# Patient Record
Sex: Female | Born: 1940 | Race: White | Hispanic: No | Smoking: Former smoker
Health system: Southern US, Community
[De-identification: ages and names within clinical notes are randomized; demographics above are authoritative.]

## PROBLEM LIST (undated history)

## (undated) DIAGNOSIS — J449 Chronic obstructive pulmonary disease, unspecified: Secondary | ICD-10-CM

## (undated) DIAGNOSIS — K219 Gastro-esophageal reflux disease without esophagitis: Secondary | ICD-10-CM

## (undated) DIAGNOSIS — I214 Non-ST elevation (NSTEMI) myocardial infarction: Secondary | ICD-10-CM

## (undated) DIAGNOSIS — M1990 Unspecified osteoarthritis, unspecified site: Secondary | ICD-10-CM

## (undated) DIAGNOSIS — E785 Hyperlipidemia, unspecified: Secondary | ICD-10-CM

## (undated) DIAGNOSIS — M545 Low back pain, unspecified: Secondary | ICD-10-CM

## (undated) DIAGNOSIS — C3491 Malignant neoplasm of unspecified part of right bronchus or lung: Secondary | ICD-10-CM

## (undated) DIAGNOSIS — I2510 Atherosclerotic heart disease of native coronary artery without angina pectoris: Secondary | ICD-10-CM

## (undated) DIAGNOSIS — F32A Depression, unspecified: Secondary | ICD-10-CM

## (undated) DIAGNOSIS — F329 Major depressive disorder, single episode, unspecified: Secondary | ICD-10-CM

## (undated) DIAGNOSIS — I5181 Takotsubo syndrome: Secondary | ICD-10-CM

## (undated) DIAGNOSIS — IMO0002 Reserved for concepts with insufficient information to code with codable children: Secondary | ICD-10-CM

## (undated) DIAGNOSIS — F419 Anxiety disorder, unspecified: Secondary | ICD-10-CM

## (undated) DIAGNOSIS — C801 Malignant (primary) neoplasm, unspecified: Secondary | ICD-10-CM

## (undated) DIAGNOSIS — J45909 Unspecified asthma, uncomplicated: Secondary | ICD-10-CM

## (undated) DIAGNOSIS — I10 Essential (primary) hypertension: Secondary | ICD-10-CM

## (undated) DIAGNOSIS — R011 Cardiac murmur, unspecified: Secondary | ICD-10-CM

## (undated) HISTORY — PX: FRACTURE SURGERY: SHX138

## (undated) HISTORY — DX: Major depressive disorder, single episode, unspecified: F32.9

## (undated) HISTORY — DX: Anxiety disorder, unspecified: F41.9

## (undated) HISTORY — DX: Gastro-esophageal reflux disease without esophagitis: K21.9

## (undated) HISTORY — PX: TOTAL ABDOMINAL HYSTERECTOMY: SHX209

## (undated) HISTORY — DX: Chronic obstructive pulmonary disease, unspecified: J44.9

## (undated) HISTORY — DX: Hyperlipidemia, unspecified: E78.5

## (undated) HISTORY — PX: KNEE SURGERY: SHX244

## (undated) HISTORY — PX: ANKLE SURGERY: SHX546

## (undated) HISTORY — DX: Low back pain, unspecified: M54.50

## (undated) HISTORY — DX: Depression, unspecified: F32.A

## (undated) HISTORY — DX: Unspecified osteoarthritis, unspecified site: M19.90

## (undated) HISTORY — PX: EYE SURGERY: SHX253

## (undated) HISTORY — DX: Low back pain: M54.5

## (undated) HISTORY — DX: Cardiac murmur, unspecified: R01.1

## (undated) HISTORY — PX: CHOLECYSTECTOMY: SHX55

## (undated) HISTORY — DX: Essential (primary) hypertension: I10

## (undated) HISTORY — PX: BACK SURGERY: SHX140

## (undated) HISTORY — PX: APPENDECTOMY: SHX54

## (undated) HISTORY — DX: Reserved for concepts with insufficient information to code with codable children: IMO0002

## (undated) HISTORY — DX: Malignant neoplasm of unspecified part of right bronchus or lung: C34.91

## (undated) HISTORY — DX: Malignant (primary) neoplasm, unspecified: C80.1

---

## 1968-07-25 HISTORY — PX: INNER EAR SURGERY: SHX679

## 1998-02-01 ENCOUNTER — Emergency Department (HOSPITAL_COMMUNITY): Admission: EM | Admit: 1998-02-01 | Discharge: 1998-02-01 | Payer: Self-pay | Admitting: *Deleted

## 1998-08-11 ENCOUNTER — Encounter: Payer: Self-pay | Admitting: Neurosurgery

## 1998-08-11 ENCOUNTER — Ambulatory Visit (HOSPITAL_COMMUNITY): Admission: RE | Admit: 1998-08-11 | Discharge: 1998-08-11 | Payer: Self-pay

## 1999-08-12 ENCOUNTER — Encounter: Payer: Self-pay | Admitting: Neurosurgery

## 1999-08-12 ENCOUNTER — Encounter: Admission: RE | Admit: 1999-08-12 | Discharge: 1999-08-12 | Payer: Self-pay | Admitting: Neurosurgery

## 1999-08-30 ENCOUNTER — Other Ambulatory Visit: Admission: RE | Admit: 1999-08-30 | Discharge: 1999-08-30 | Payer: Self-pay | Admitting: *Deleted

## 2000-01-04 ENCOUNTER — Encounter: Admission: RE | Admit: 2000-01-04 | Discharge: 2000-01-04 | Payer: Self-pay | Admitting: *Deleted

## 2000-01-04 ENCOUNTER — Encounter: Payer: Self-pay | Admitting: *Deleted

## 2000-06-22 ENCOUNTER — Ambulatory Visit (HOSPITAL_COMMUNITY): Admission: RE | Admit: 2000-06-22 | Discharge: 2000-06-22 | Payer: Self-pay | Admitting: Gastroenterology

## 2000-11-30 ENCOUNTER — Encounter: Payer: Self-pay | Admitting: Internal Medicine

## 2000-11-30 ENCOUNTER — Ambulatory Visit (HOSPITAL_COMMUNITY): Admission: RE | Admit: 2000-11-30 | Discharge: 2000-11-30 | Payer: Self-pay | Admitting: Internal Medicine

## 2001-01-09 ENCOUNTER — Ambulatory Visit (HOSPITAL_COMMUNITY): Admission: RE | Admit: 2001-01-09 | Discharge: 2001-01-09 | Payer: Self-pay | Admitting: Internal Medicine

## 2001-01-09 ENCOUNTER — Encounter: Payer: Self-pay | Admitting: Internal Medicine

## 2001-02-05 ENCOUNTER — Encounter: Payer: Self-pay | Admitting: Gastroenterology

## 2001-02-05 ENCOUNTER — Encounter: Admission: RE | Admit: 2001-02-05 | Discharge: 2001-02-05 | Payer: Self-pay | Admitting: Gastroenterology

## 2001-02-20 ENCOUNTER — Encounter: Admission: RE | Admit: 2001-02-20 | Discharge: 2001-02-20 | Payer: Self-pay | Admitting: Gastroenterology

## 2001-02-20 ENCOUNTER — Encounter: Payer: Self-pay | Admitting: Gastroenterology

## 2001-02-25 ENCOUNTER — Ambulatory Visit (HOSPITAL_COMMUNITY): Admission: RE | Admit: 2001-02-25 | Discharge: 2001-02-25 | Payer: Self-pay | Admitting: Internal Medicine

## 2001-02-25 ENCOUNTER — Encounter: Payer: Self-pay | Admitting: Internal Medicine

## 2001-02-28 ENCOUNTER — Ambulatory Visit (HOSPITAL_COMMUNITY): Admission: RE | Admit: 2001-02-28 | Discharge: 2001-02-28 | Payer: Self-pay | Admitting: Internal Medicine

## 2001-05-02 ENCOUNTER — Encounter: Admission: RE | Admit: 2001-05-02 | Discharge: 2001-05-02 | Payer: Self-pay | Admitting: Internal Medicine

## 2001-05-02 ENCOUNTER — Encounter: Payer: Self-pay | Admitting: Internal Medicine

## 2002-05-03 ENCOUNTER — Encounter: Payer: Self-pay | Admitting: Internal Medicine

## 2002-05-03 ENCOUNTER — Encounter: Admission: RE | Admit: 2002-05-03 | Discharge: 2002-05-03 | Payer: Self-pay | Admitting: Internal Medicine

## 2002-06-27 ENCOUNTER — Ambulatory Visit (HOSPITAL_COMMUNITY): Admission: RE | Admit: 2002-06-27 | Discharge: 2002-06-27 | Payer: Self-pay | Admitting: Internal Medicine

## 2003-01-31 ENCOUNTER — Encounter: Payer: Self-pay | Admitting: Internal Medicine

## 2003-01-31 ENCOUNTER — Encounter: Admission: RE | Admit: 2003-01-31 | Discharge: 2003-01-31 | Payer: Self-pay | Admitting: Internal Medicine

## 2003-01-31 IMAGING — CT CT ABDOMEN W/ CM
1 series · 15 of 32 positions shown, 19 images · IV contrast (GASTRO & OMNI 100/300)
Comparison: none

FINDINGS
CLINICAL DATA: LEFT LOWER QUADRANT PAIN.  ASTHMA.  HYPERTENSION.  PARTIAL HYSTERECTOMY,
CHOLECYSTECTOMY, APPENDECTOMY.   (CON - [V4])
ABDOMINAL CT WITH CONTRAST
SINCE [DATE], THERE IS NO INTERVAL CHANGE.  FOLLOWING ORAL CONTRAST AND IV ADMINISTRATION OF 100
CC OMNIPAQUE 300, MULTIDETECTOR SPIRAL AXIAL IMAGES THROUGH THE ABDOMEN AGAIN SHOW SMALL HIATUS
HERNIA, POST CHOLECYSTECTOMY STATE WITH NO DILATED BILIARY TRACT, SMALL STABLE BILATERAL RENAL
CYSTIC FOCI.  THE REMAINING ABDOMINAL ORGANS ARE NORMAL WITH NO NEW INFLAMMATION OR ADENOPATHY.
IMPRESSION
SINCE [DATE], NO INTERVAL CHANGE:
1.  STABLE SMALL HIATUS HERNIA, POST CHOLECYSTECTOMY.  STABLE SMALL RENAL CYSTS.
2.  OTHERWISE NEGATIVE.
PELVIC CT WITH CONTRAST
FOLLOWING ORAL AND IV CONTRAST, MULTIDETECTOR SPIRAL AXIAL IMAGES THROUGH THE PELVIS WERE OBTAINED.
THE PATIENT IS STATUS POST PARTIAL HYSTERECTOMY AND APPENDECTOMY BY HISTORY AND CT EXAMINATION.
INCIDENTAL 1 CM RIGHT OVARIAN CYST IS SEEN.  THERE IS MINIMAL EDEMATOUS INFILTRATION OF THE SIGMOID
MESENTERIC FAT WITH SLIGHTLY GREATER WALL THICKENING OF THE PROXIMAL SIGMOID COLON.  SCATTERED
DIVERTICULOSIS IS SEEN IN THIS REGION.  THIS PROBABLY REPRESENTS MILD FINDINGS  OF ACUTE
DIVERTICULITIS.  NO SIGNIFICANT PHLEGMON NOR ABSCESS IS SEEN.  NO FREE FLUID NOR ADENOPATHY IS
NOTED.  THE REMAINDER OF STUDY IS UNREMARKABLE WITH AGAIN NOTED RAY CAGE INTERBODY FUSION AT THE
INFERIOR LUMBAR SPINE.
1.  PROBABLE MINIMAL SIGMOID DIVERTICULITIS.
2.  1.3 CM RIGHT OVARIAN CYST.
3.  POST APPENDECTOMY AND PARTIAL HYSTERECTOMY AND RAY CAGE INTERBODY FUSION AT THE INFERIOR LUMBAR
SPINE.
4.  OTHERWISE NEGATIVE.

[Series 2: — · axial · 0.70mm/px · z∈[-387,-17]mm · 15 of 109 slices shown, 19 images]
[im 7/109  soft-tissue]
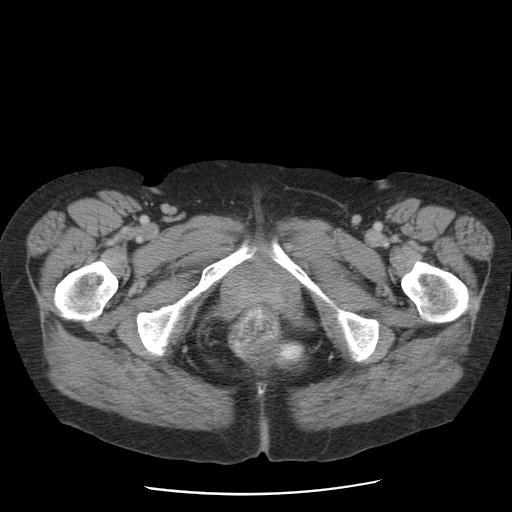
[im 7/109  bone]
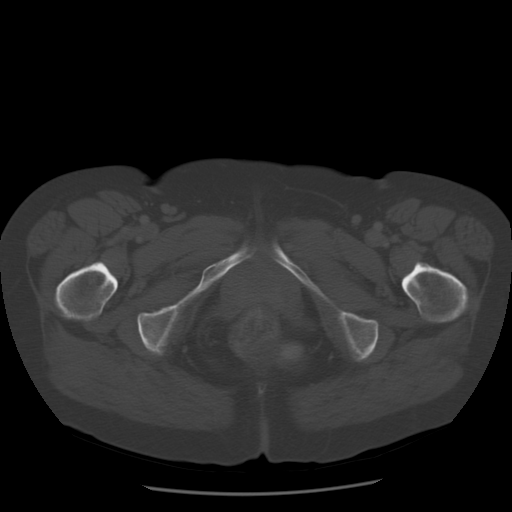
[im 14/109  soft-tissue]
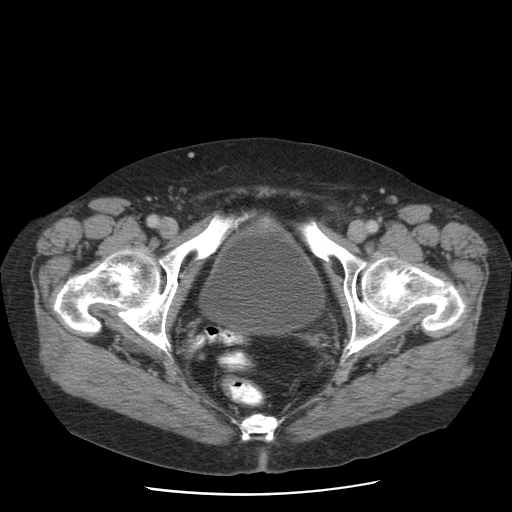
[im 21/109  soft-tissue]
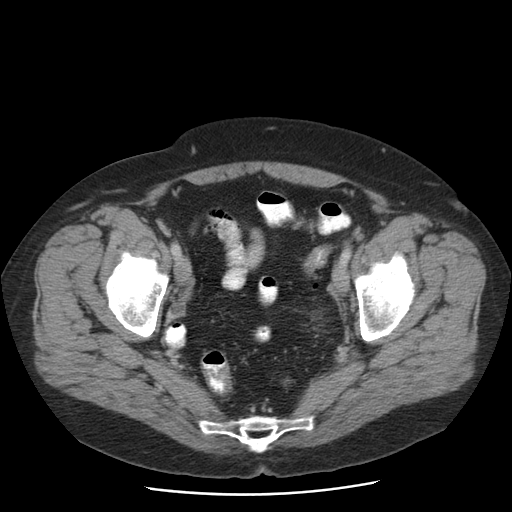
[im 32/109  soft-tissue]
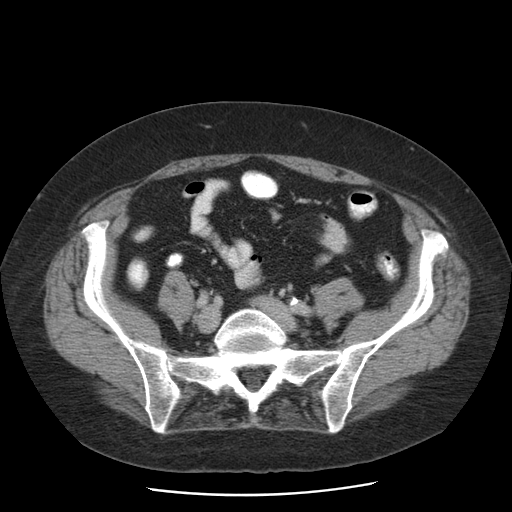
[im 39/109  soft-tissue]
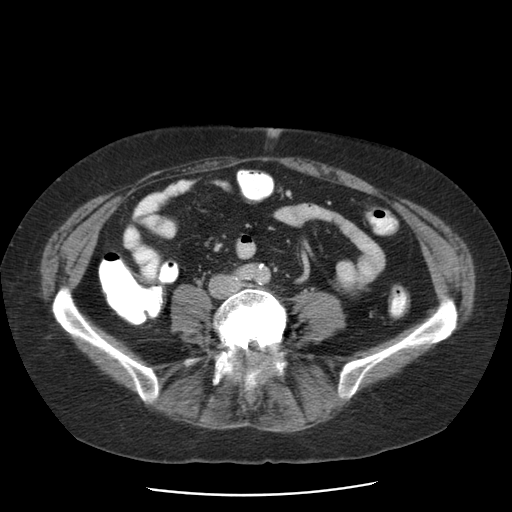
[im 46/109  soft-tissue]
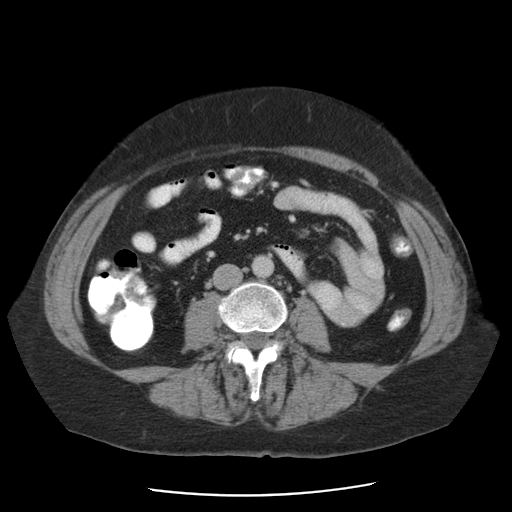
[im 56/109  soft-tissue]
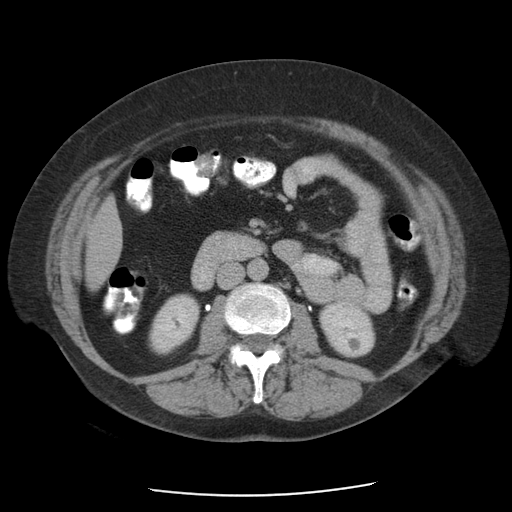
[im 63/109  soft-tissue]
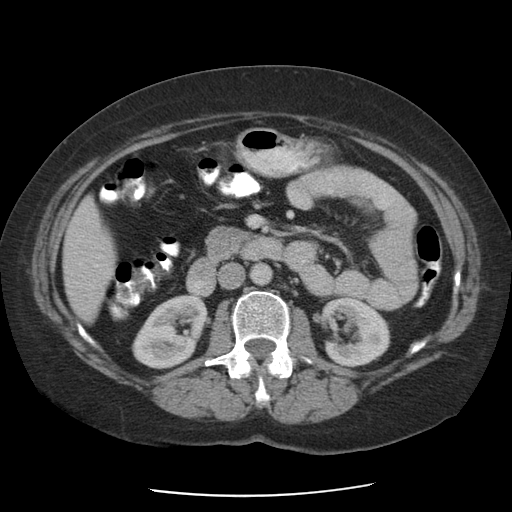
[im 70/109  soft-tissue]
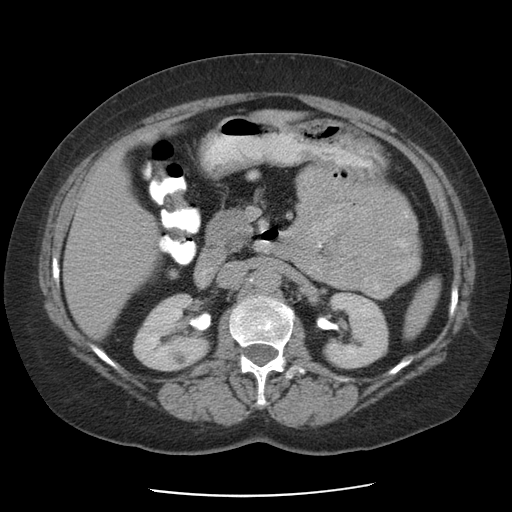
[im 70/109  bone]
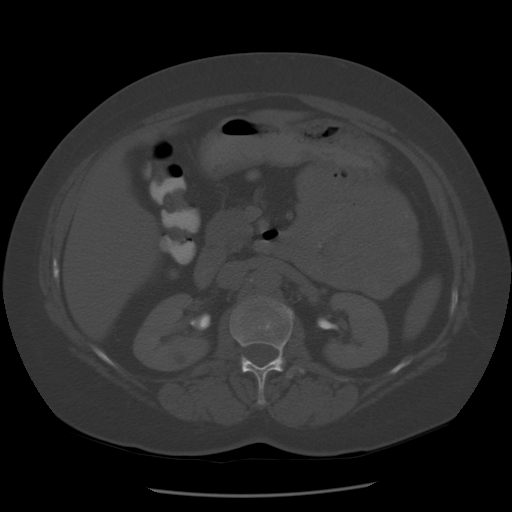
[im 77/109  soft-tissue]
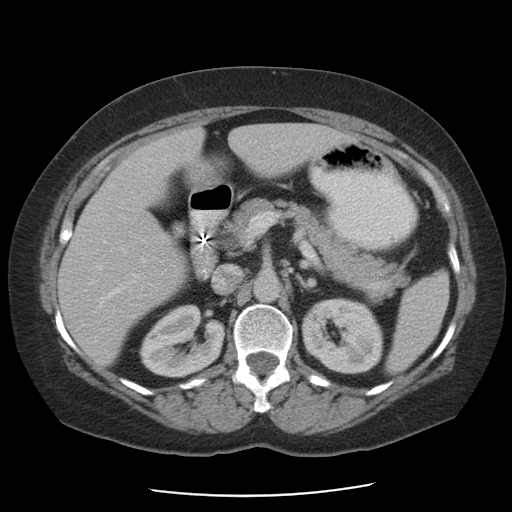
[im 88/109  soft-tissue]
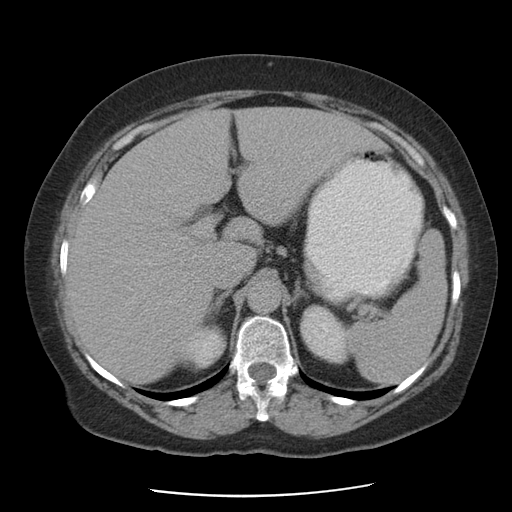
[im 95/109  soft-tissue]
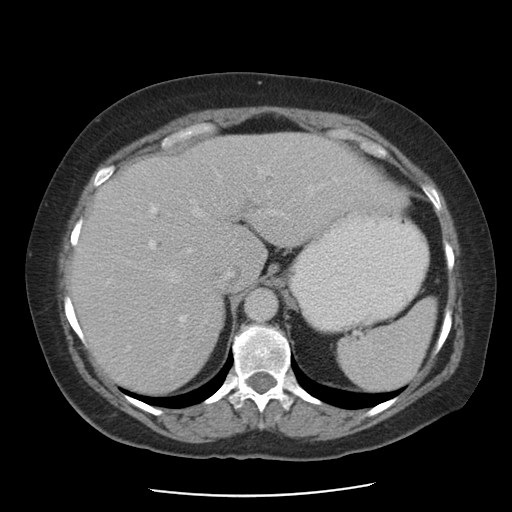
[im 95/109  lung]
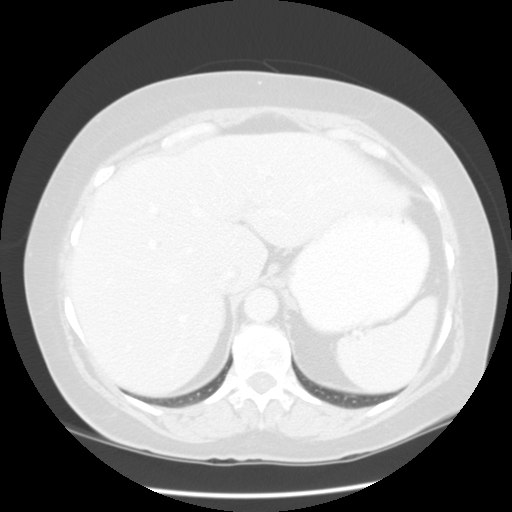
[im 98/109  lung]
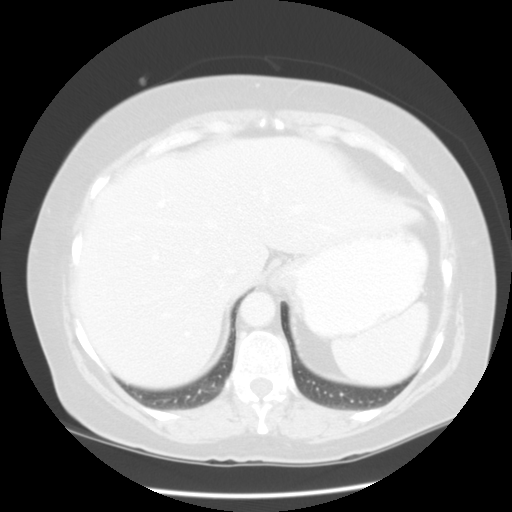
[im 102/109  soft-tissue]
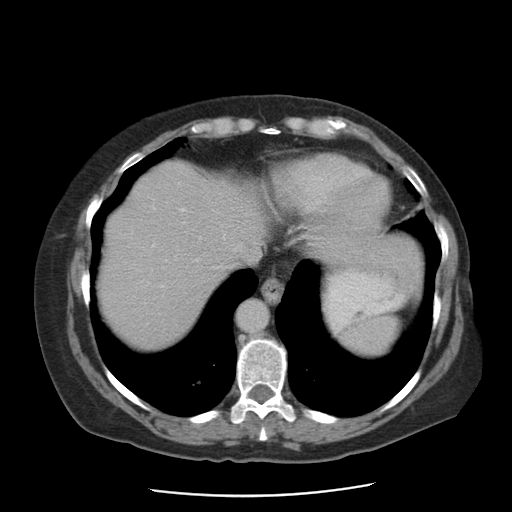
[im 102/109  lung]
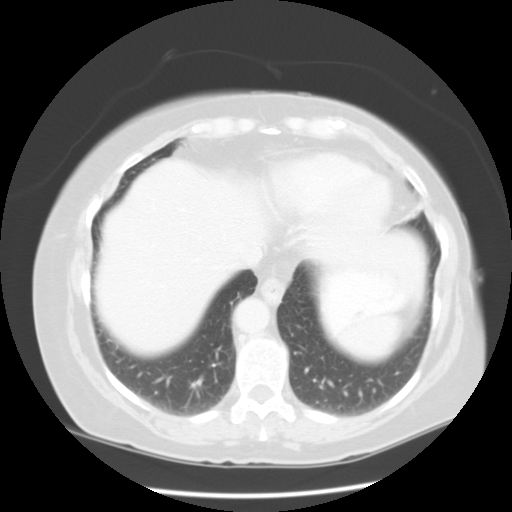
[im 105/109  lung]
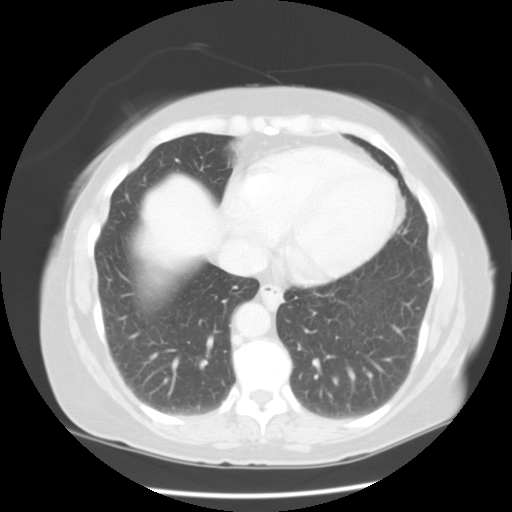

[15 of 32 positions shown; findings below may reference images not displayed]

## 2003-10-10 ENCOUNTER — Encounter: Admission: RE | Admit: 2003-10-10 | Discharge: 2003-10-10 | Payer: Self-pay | Admitting: Internal Medicine

## 2004-04-29 ENCOUNTER — Other Ambulatory Visit: Admission: RE | Admit: 2004-04-29 | Discharge: 2004-04-29 | Payer: Self-pay | Admitting: Internal Medicine

## 2005-03-13 ENCOUNTER — Emergency Department (HOSPITAL_COMMUNITY): Admission: EM | Admit: 2005-03-13 | Discharge: 2005-03-13 | Payer: Self-pay | Admitting: *Deleted

## 2005-03-13 IMAGING — CR DG ANKLE COMPLETE 3+V*L*
3 series · 3 of 3 positions shown · non-contrast
Comparison: none

CLINICAL DATA: Left ankle injury with pain and swelling.
 3-VIEW LEFT ANKLE:

[view not recorded (1 of 3)]
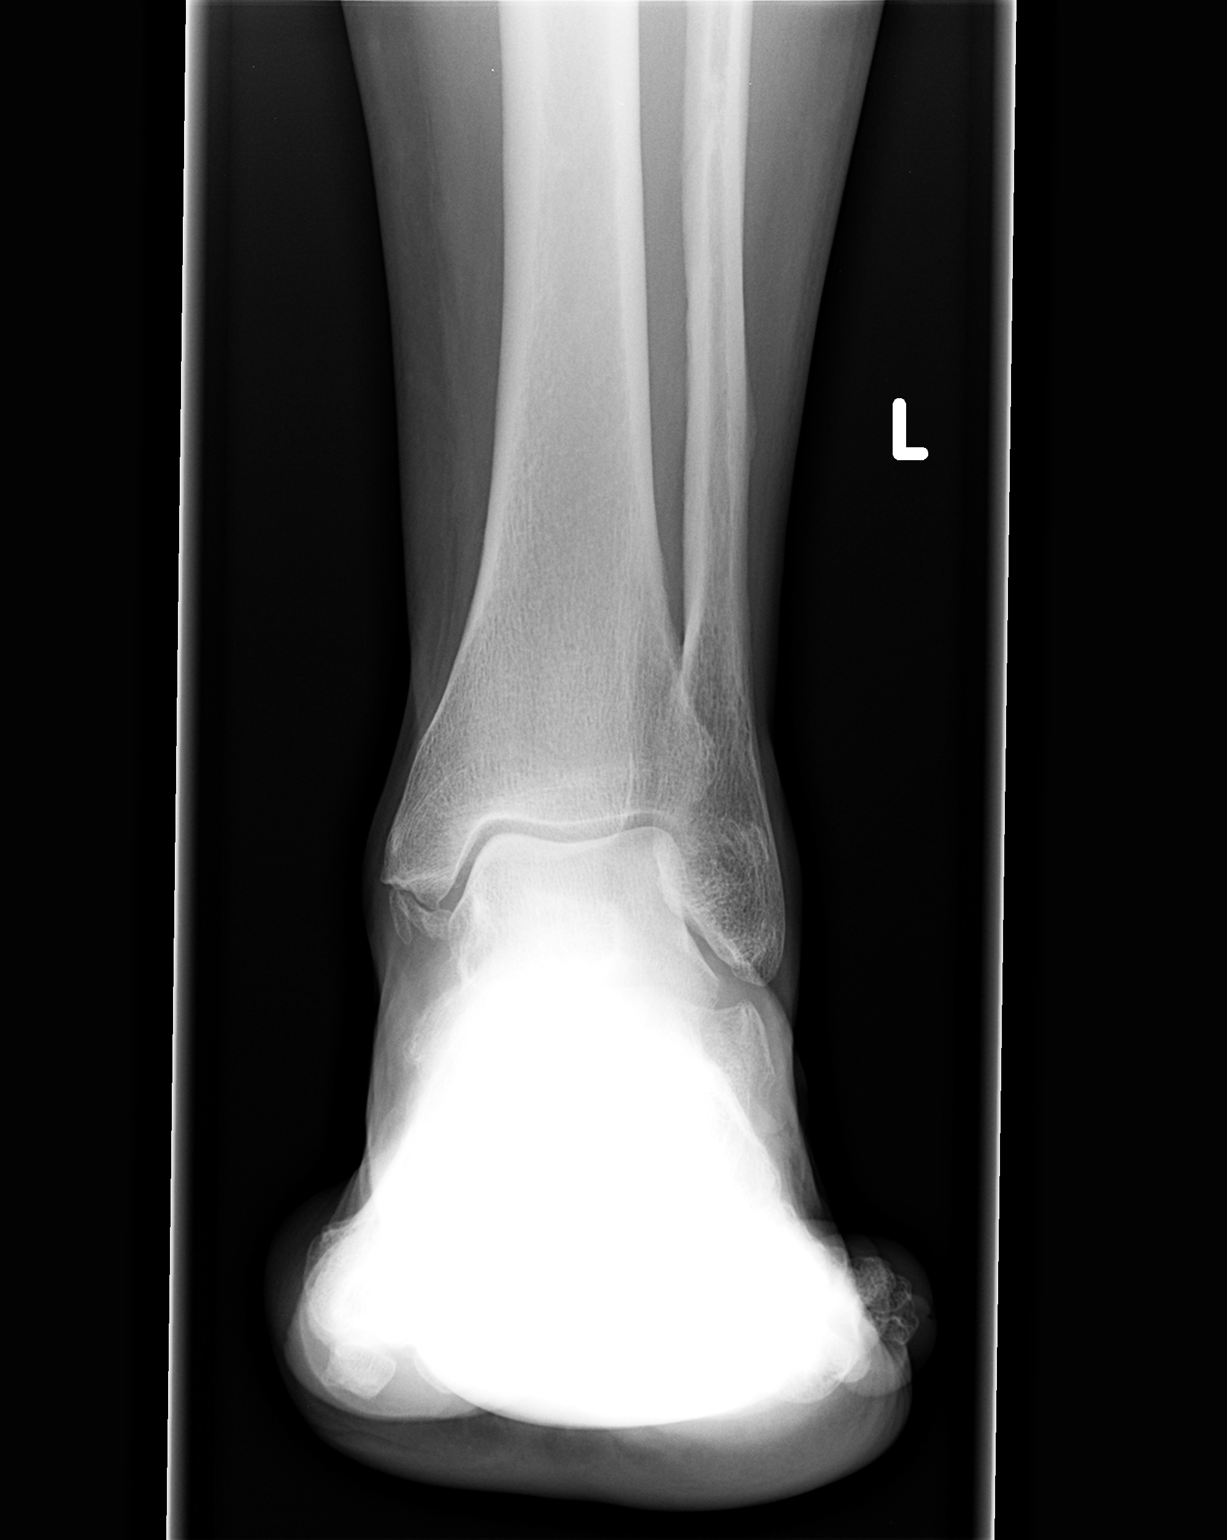

[view not recorded (2 of 3)]
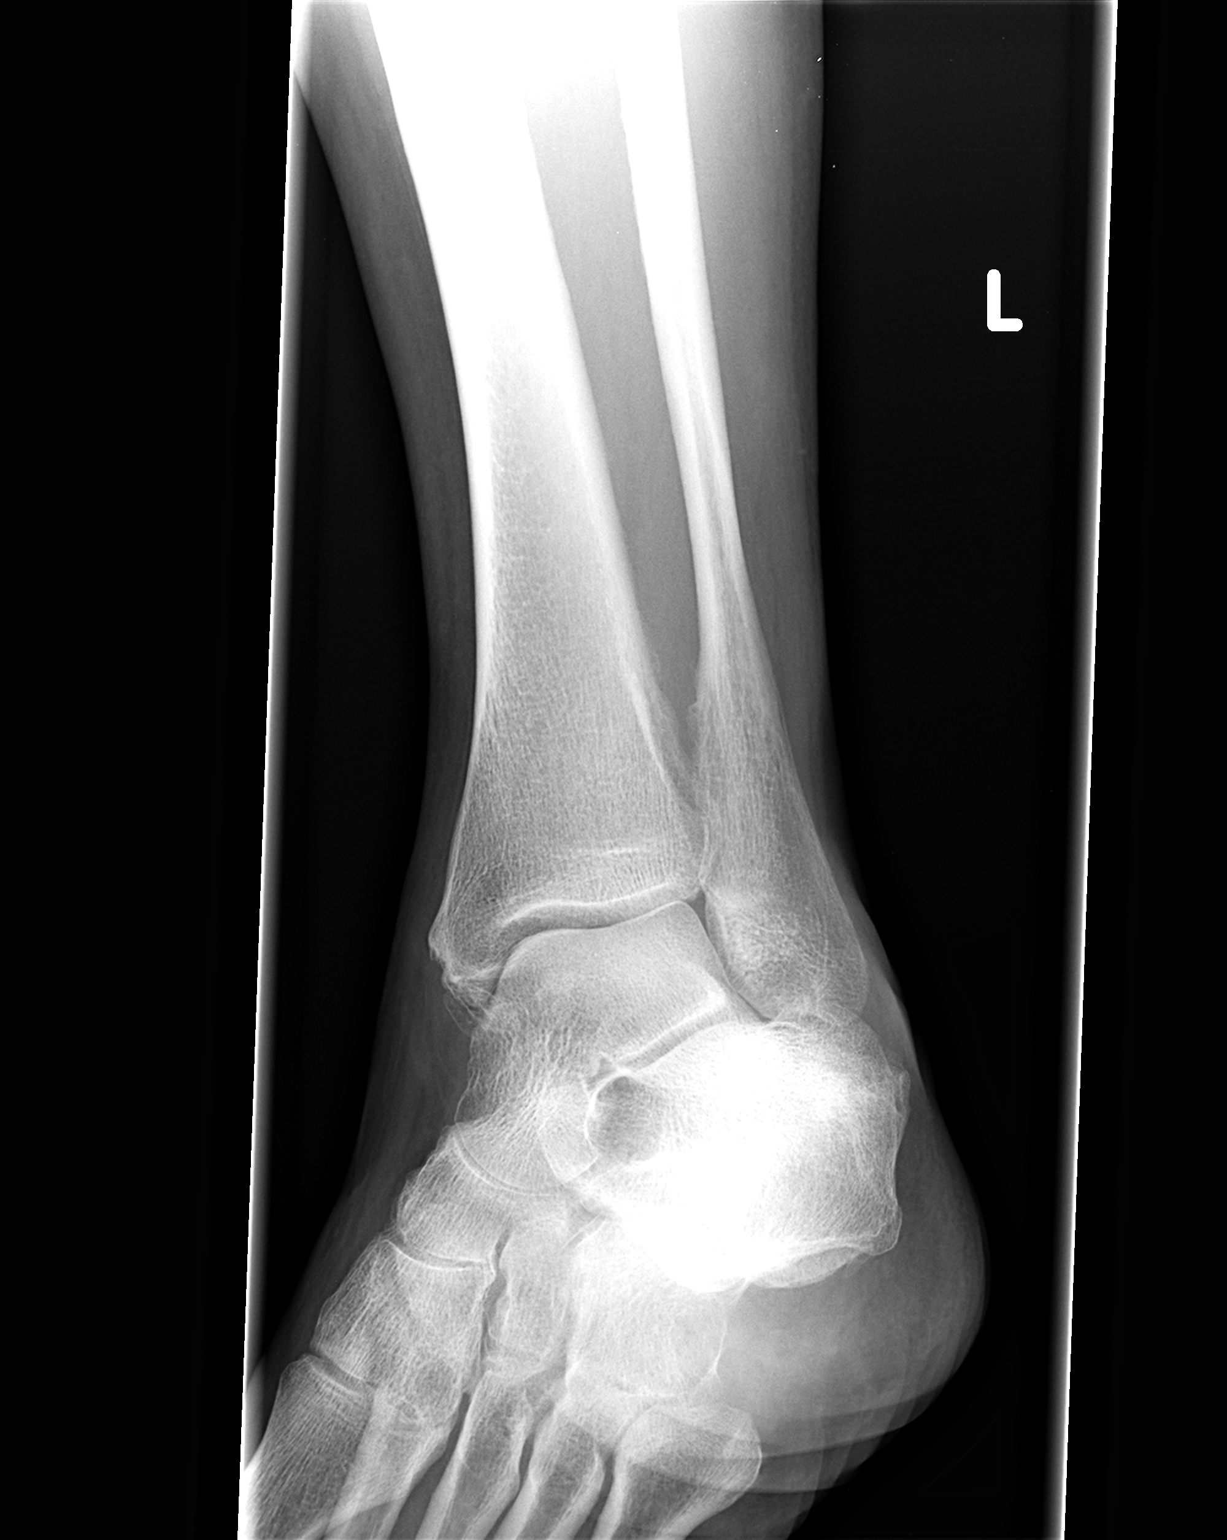

[view not recorded (3 of 3)]
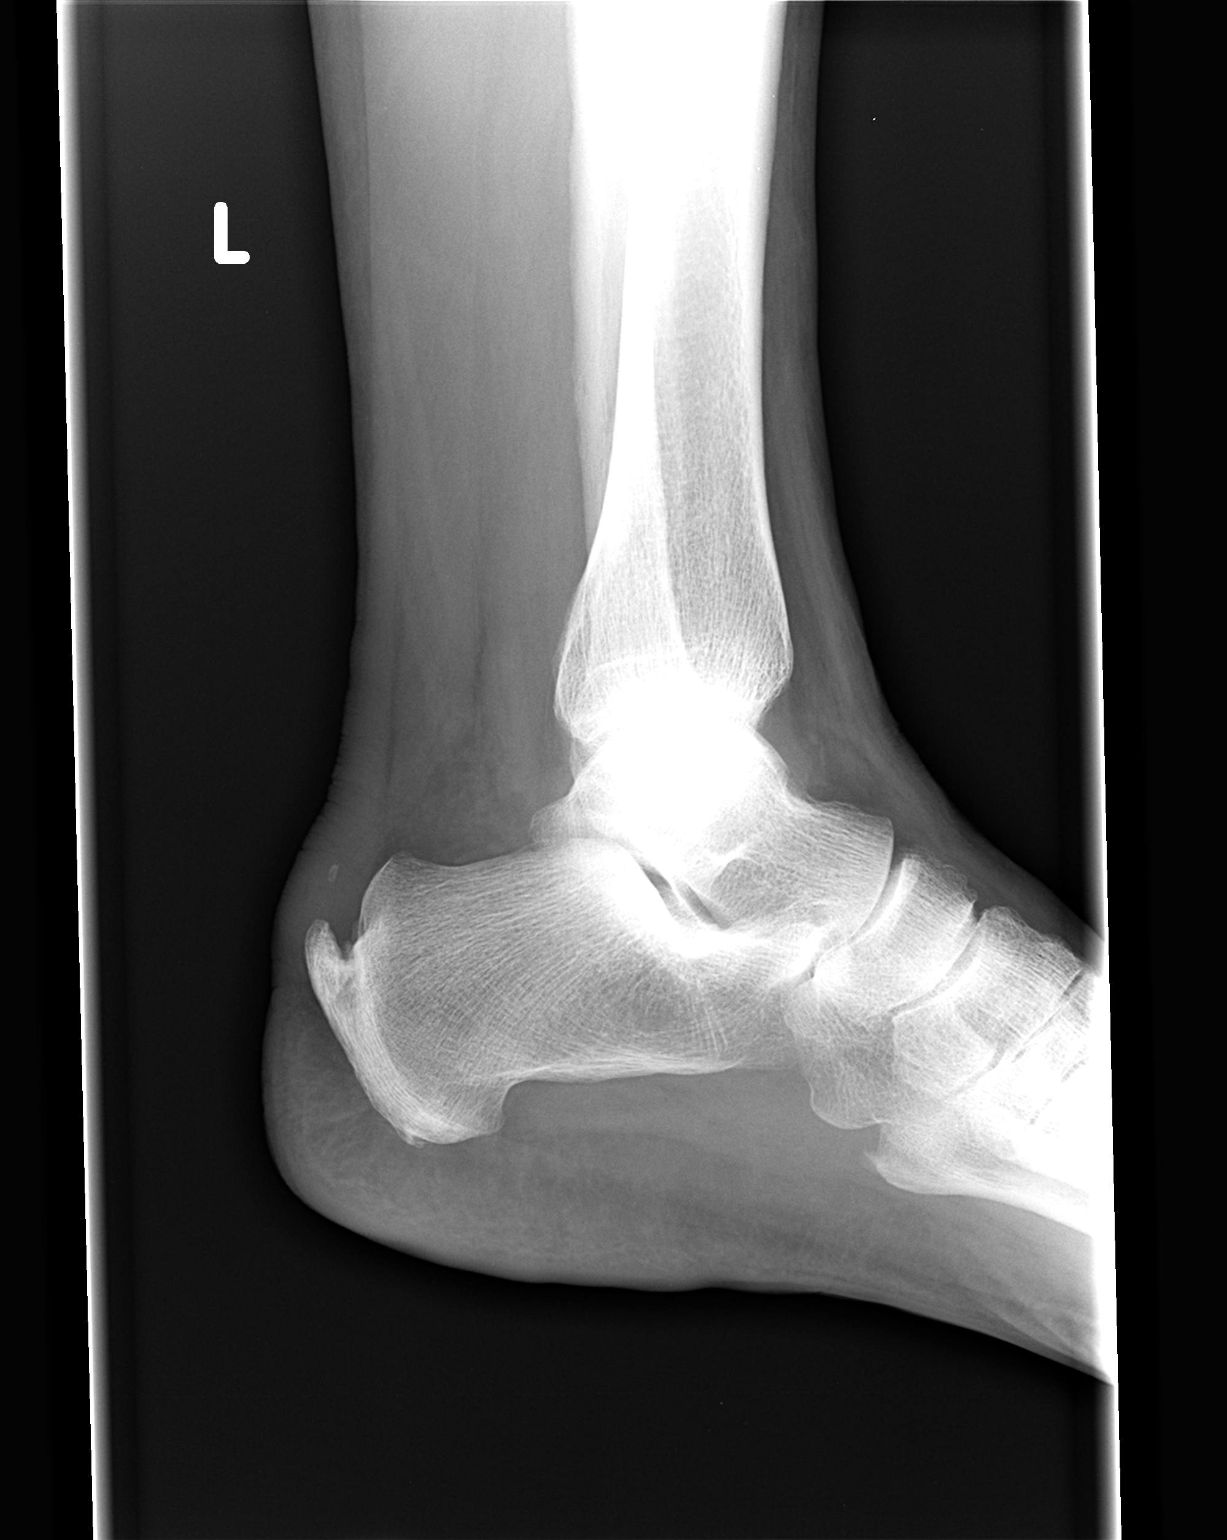

[3 of 3 positions shown; findings below may reference images not displayed]

FINDINGS: No evidence of acute fracture, subluxation, or dislocation.  The ankle mortise is intact.  Small ankle fusion is identified.  Remote medial malleolar fracture is present.
IMPRESSION: Ankle effusion without evidence of acute fracture.

## 2005-03-17 ENCOUNTER — Emergency Department (HOSPITAL_COMMUNITY): Admission: EM | Admit: 2005-03-17 | Discharge: 2005-03-17 | Payer: Self-pay | Admitting: Emergency Medicine

## 2005-06-02 ENCOUNTER — Encounter: Admission: RE | Admit: 2005-06-02 | Discharge: 2005-06-02 | Payer: Self-pay | Admitting: Internal Medicine

## 2005-07-21 ENCOUNTER — Emergency Department (HOSPITAL_COMMUNITY): Admission: EM | Admit: 2005-07-21 | Discharge: 2005-07-22 | Payer: Self-pay | Admitting: Emergency Medicine

## 2005-07-21 IMAGING — CR DG SHOULDER 2+V*L*
3 series · 3 of 3 positions shown · non-contrast
Comparison: none

CLINICAL DATA: Shoulder pain.   No known injury.    
 LEFT SHOULDER ? 3 VIEW:
 There are some mild degenerative changes at the acromioclavicular joint.  Otherwise, normal exam.

[w shoulder ap internal left]
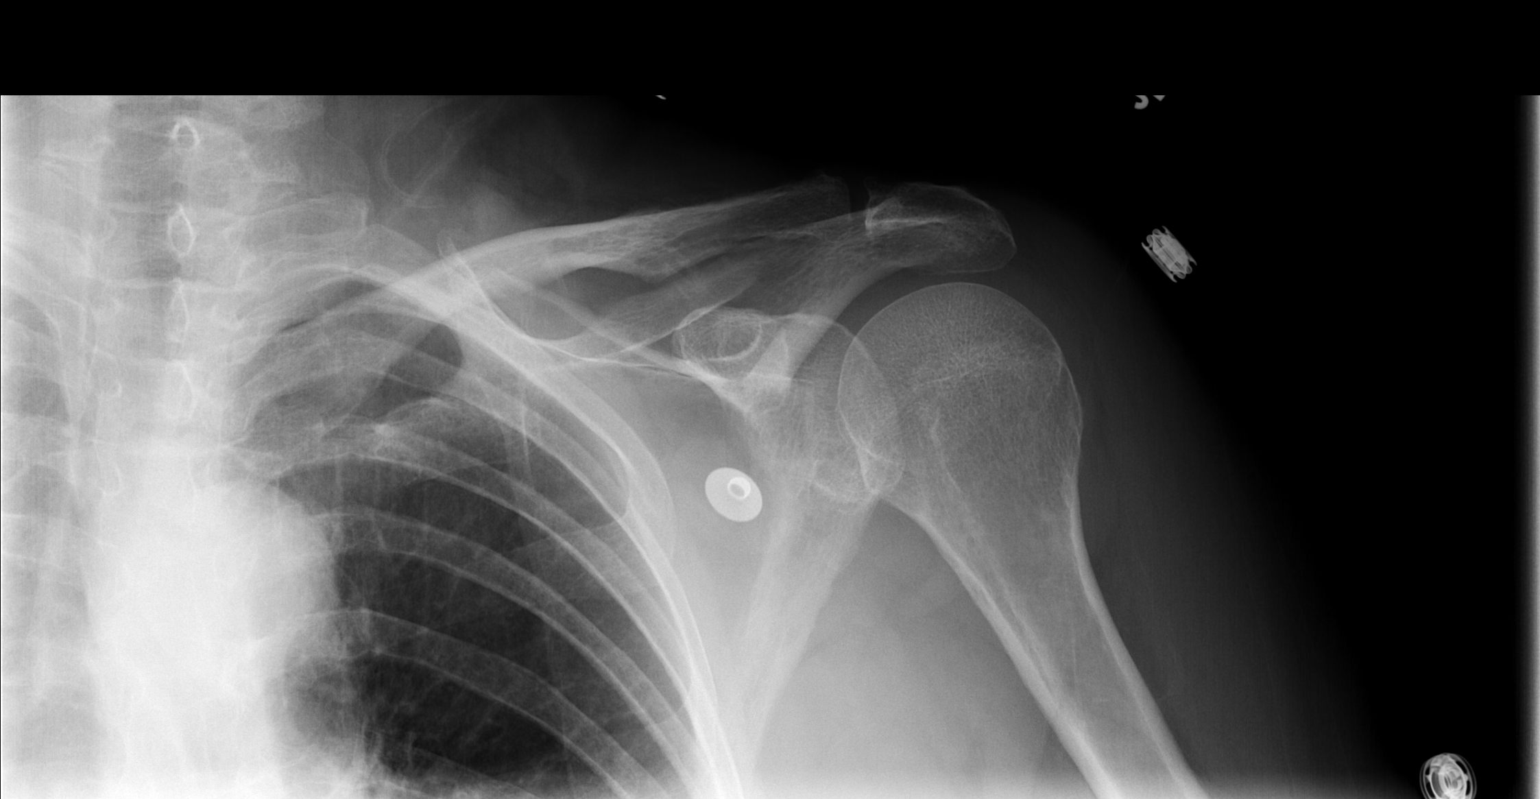

[w shoulder ap external left]
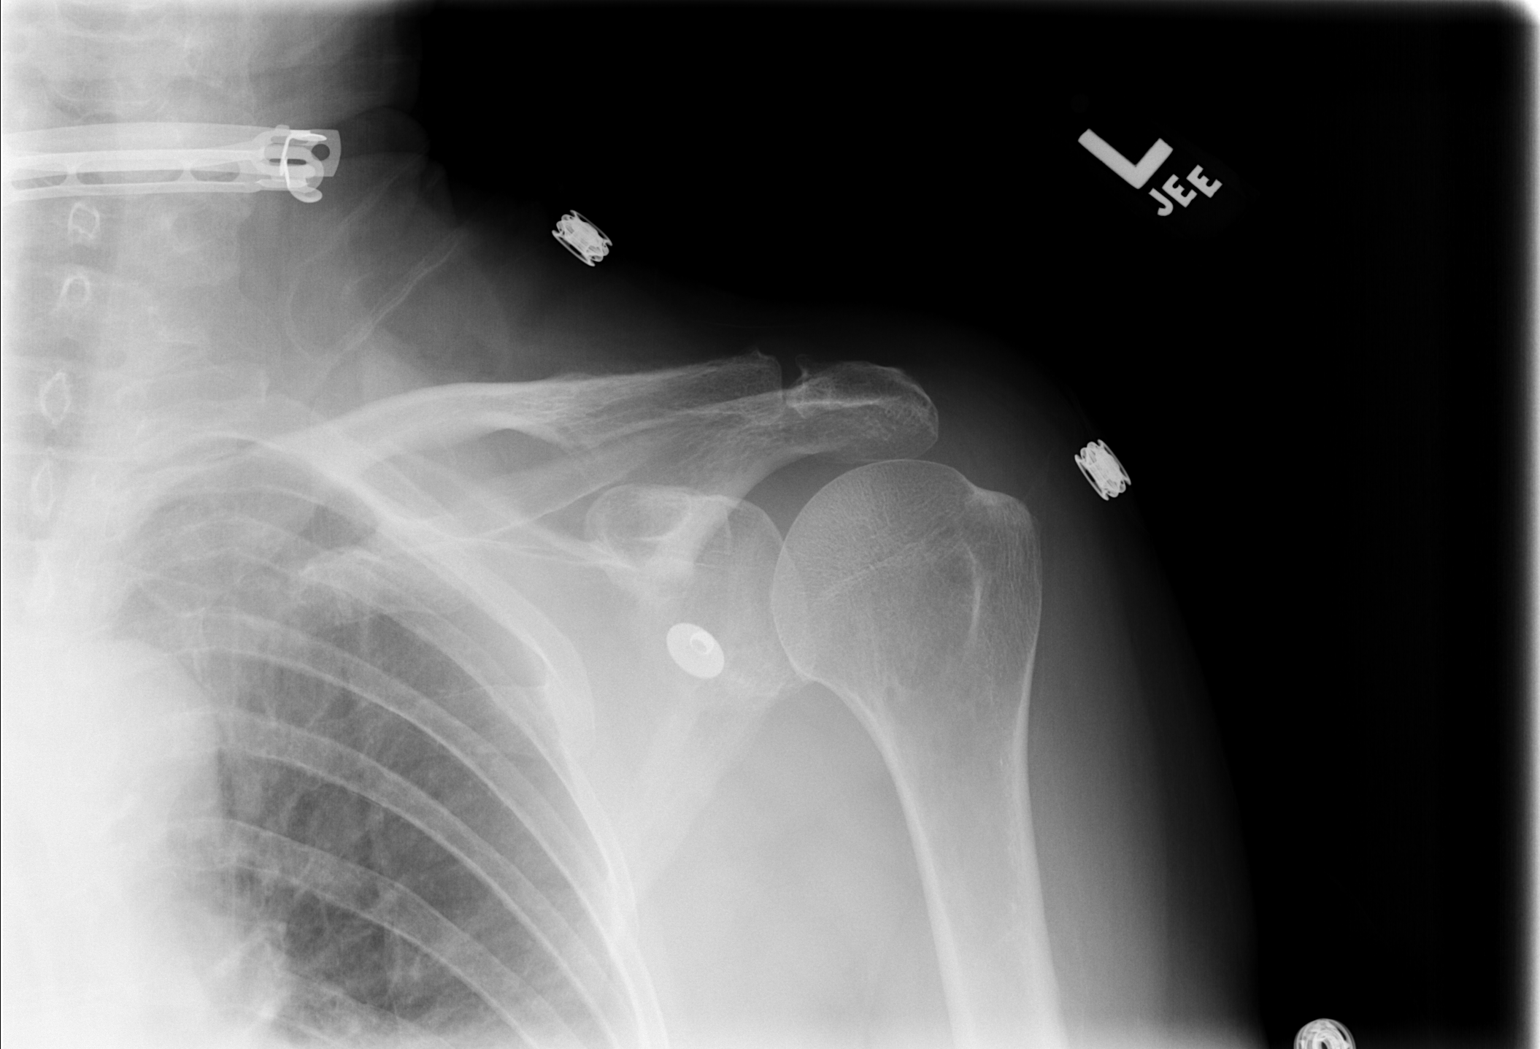

[w shoulder y view left]
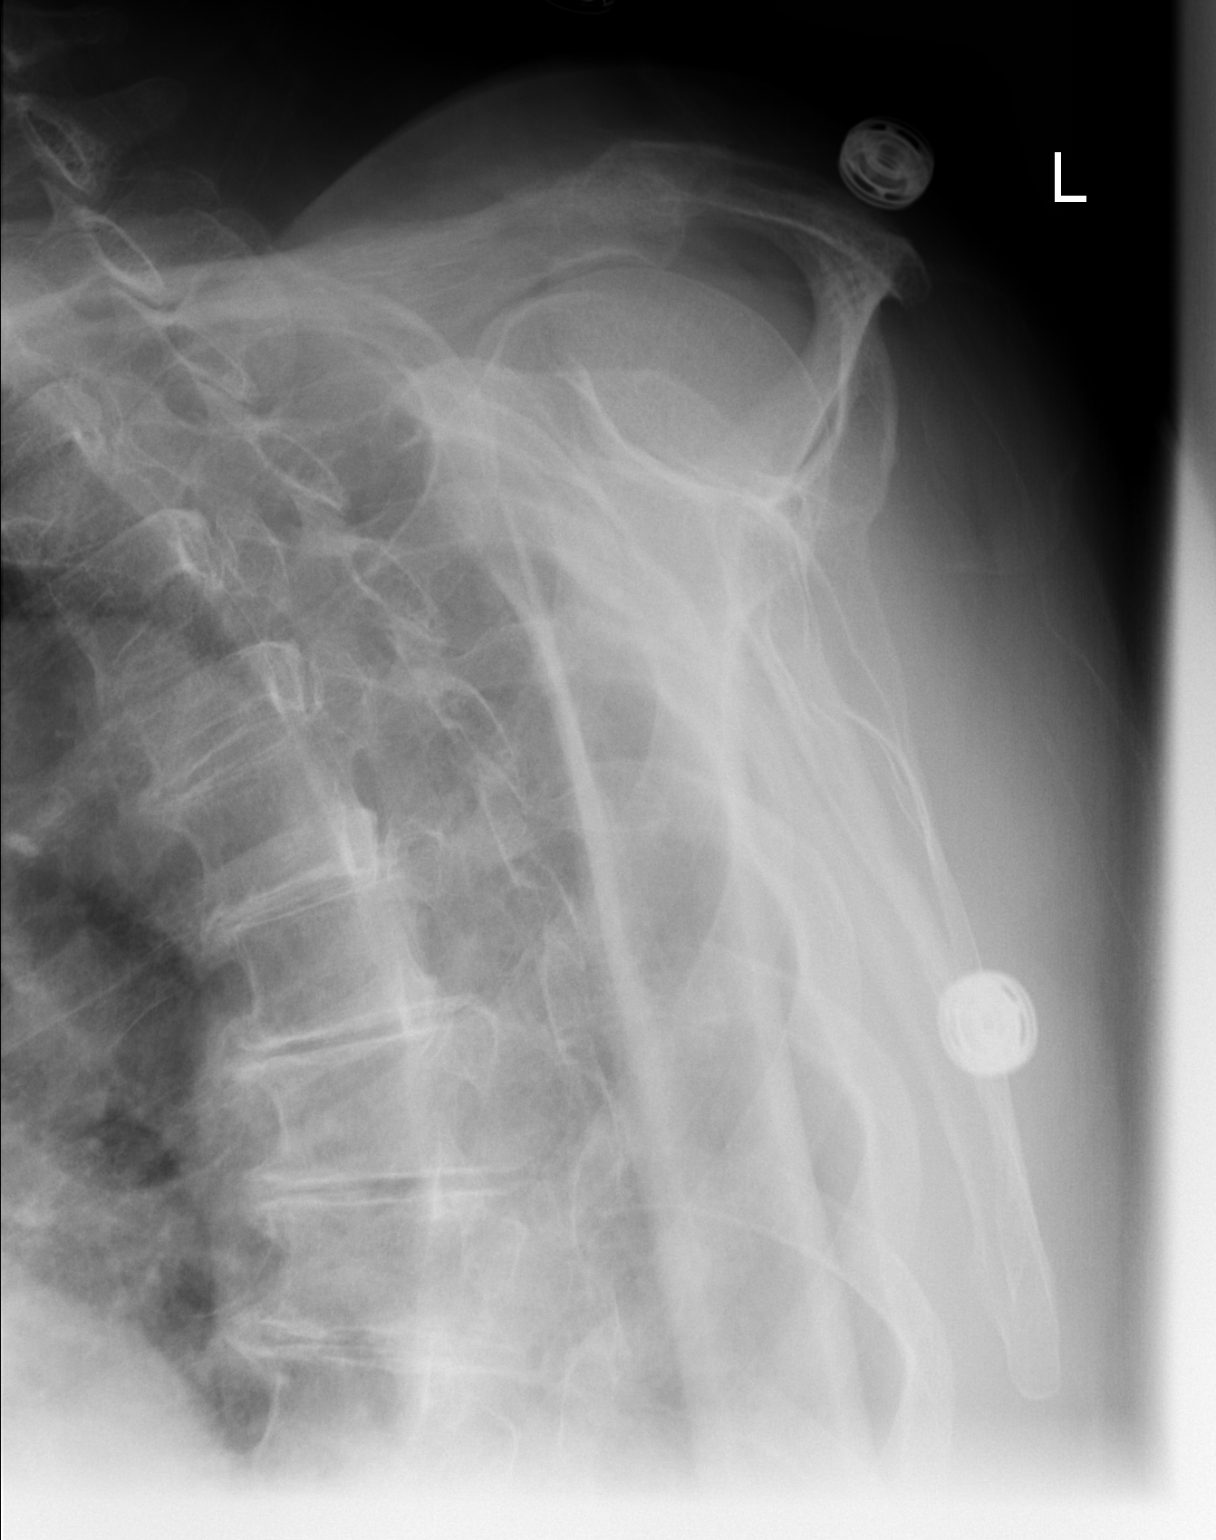

[3 of 3 positions shown; findings below may reference images not displayed]

IMPRESSION: Mild AC joint arthritis.

## 2006-10-18 ENCOUNTER — Encounter: Admission: RE | Admit: 2006-10-18 | Discharge: 2006-10-18 | Payer: Self-pay | Admitting: Internal Medicine

## 2006-12-04 ENCOUNTER — Other Ambulatory Visit: Admission: RE | Admit: 2006-12-04 | Discharge: 2006-12-04 | Payer: Self-pay | Admitting: Internal Medicine

## 2007-01-17 ENCOUNTER — Encounter: Admission: RE | Admit: 2007-01-17 | Discharge: 2007-01-17 | Payer: Self-pay | Admitting: Gastroenterology

## 2007-01-17 IMAGING — CR DG BE W/ AIR HIGH DENSITY
12 of 22 series · 12 of 22 positions shown · non-contrast
Comparison: none

CLINICAL DATA: Incomplete colonoscopy.  Question, colonic polyps.
 DIAGNOSTIC BE WITH AIR HIGH DENSITY:

[run (1 of 7)]
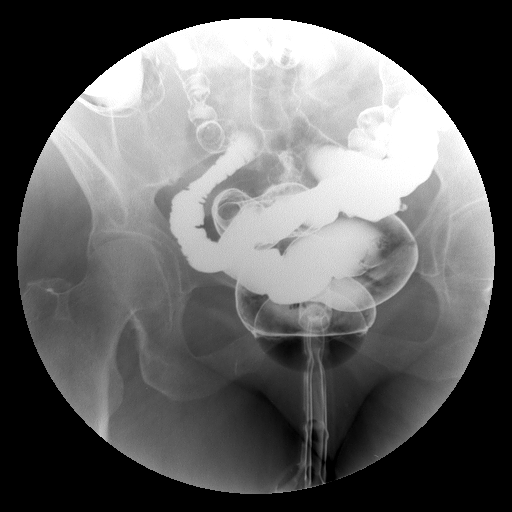

[run (2 of 7)]
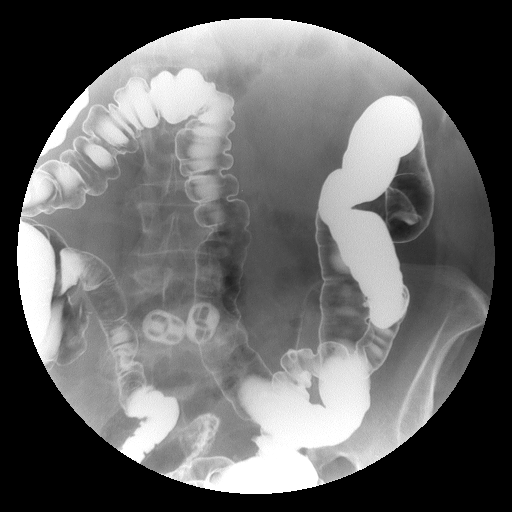

[run (3 of 7)]
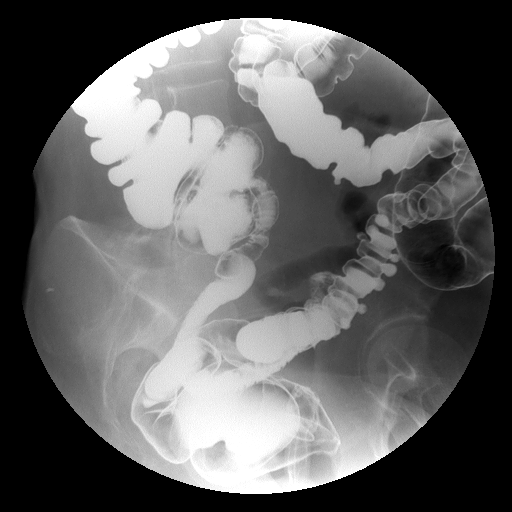

[run (4 of 7)]
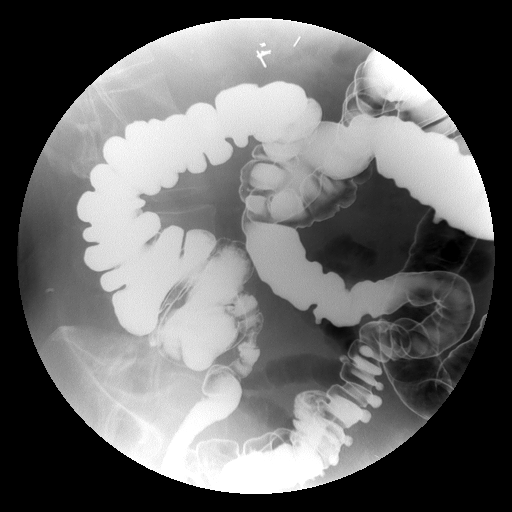

[run (5 of 7)]
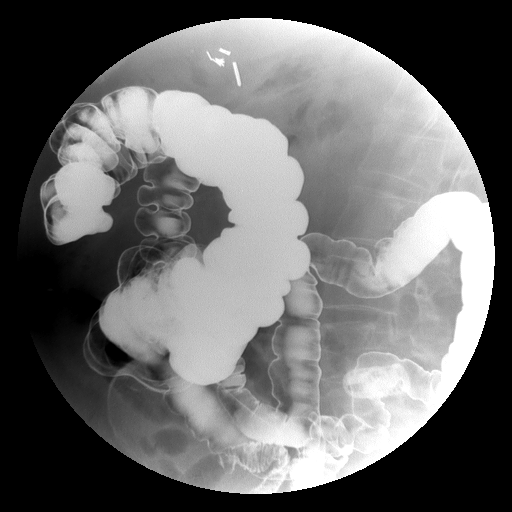

[run (6 of 7)]
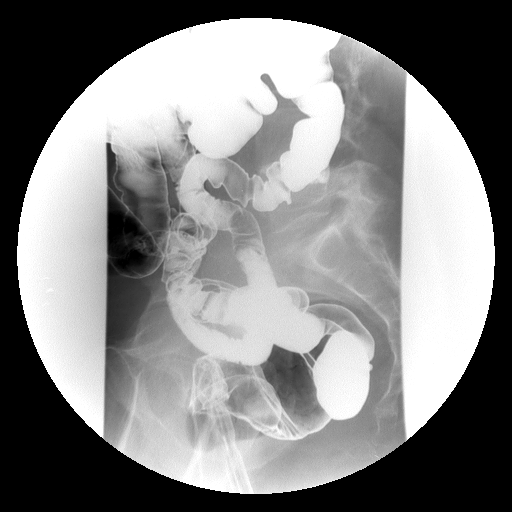

[run (7 of 7)]
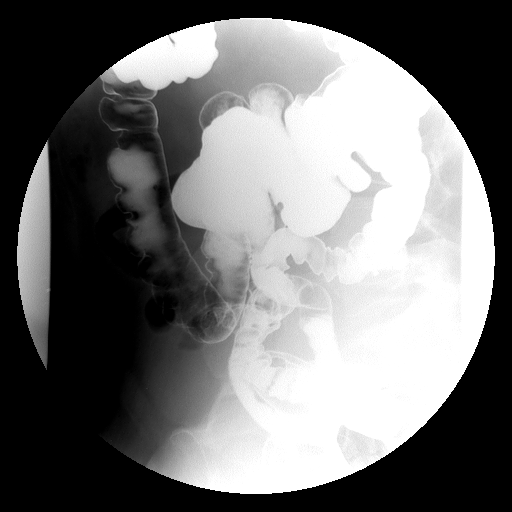

[view not recorded (1 of 5)]
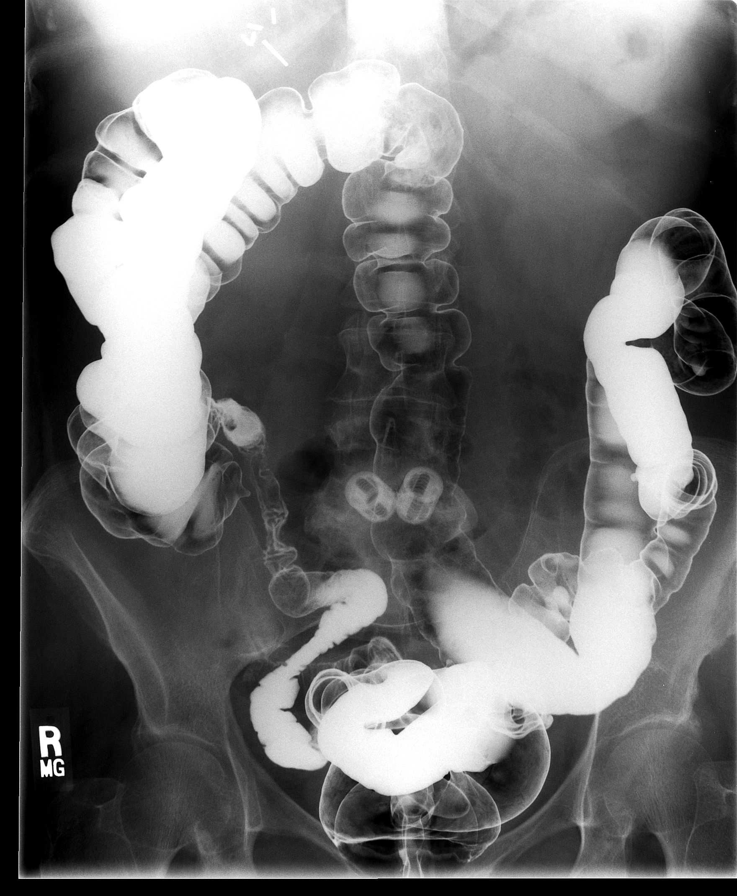

[view not recorded (2 of 5)]
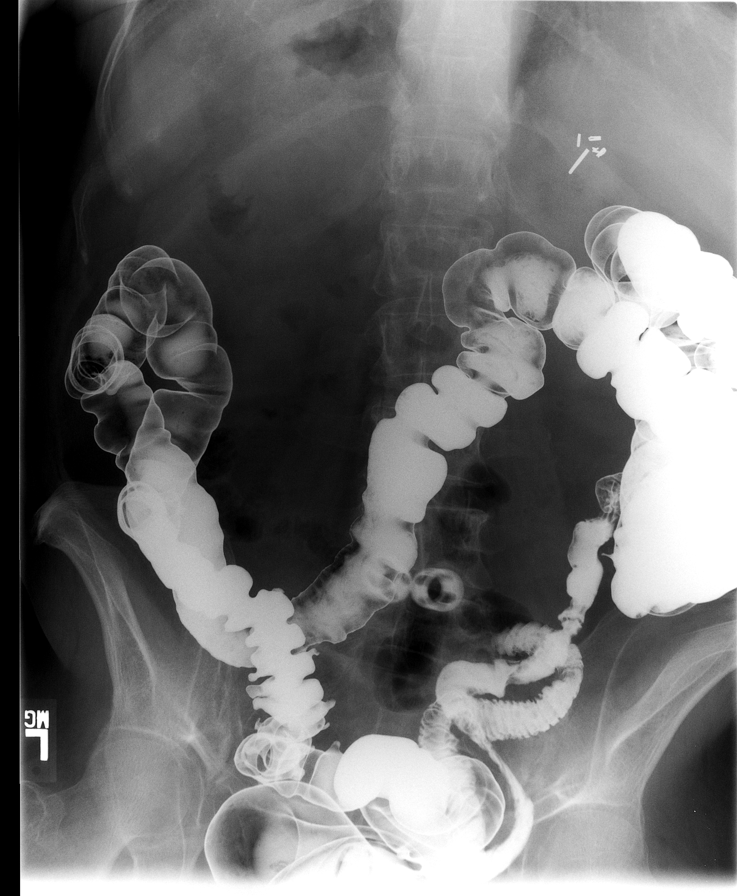

[view not recorded (3 of 5)]
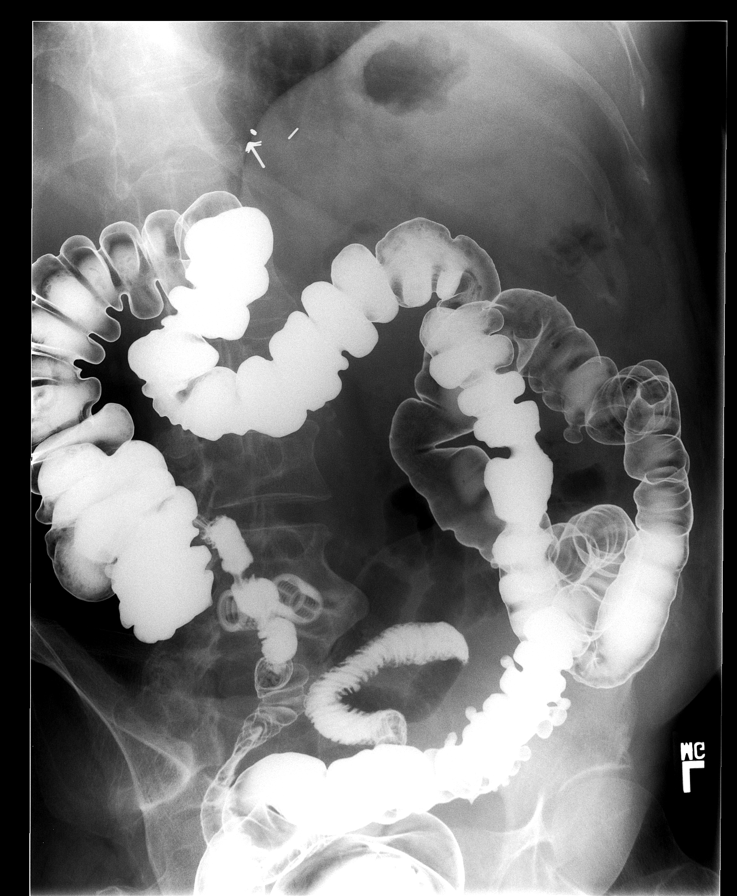

[view not recorded (4 of 5)]
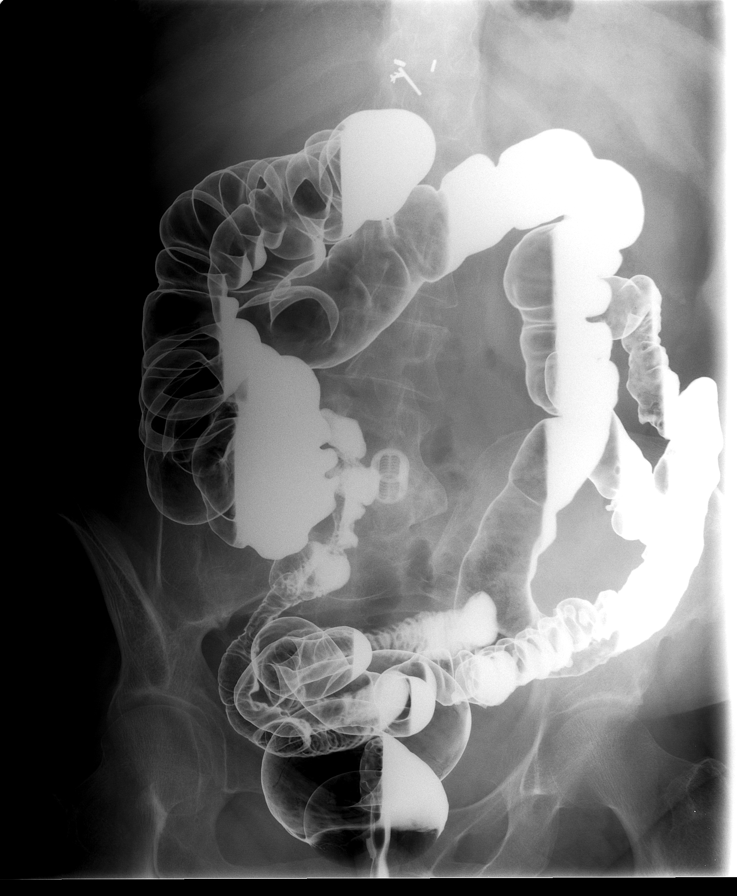

[view not recorded (5 of 5)]
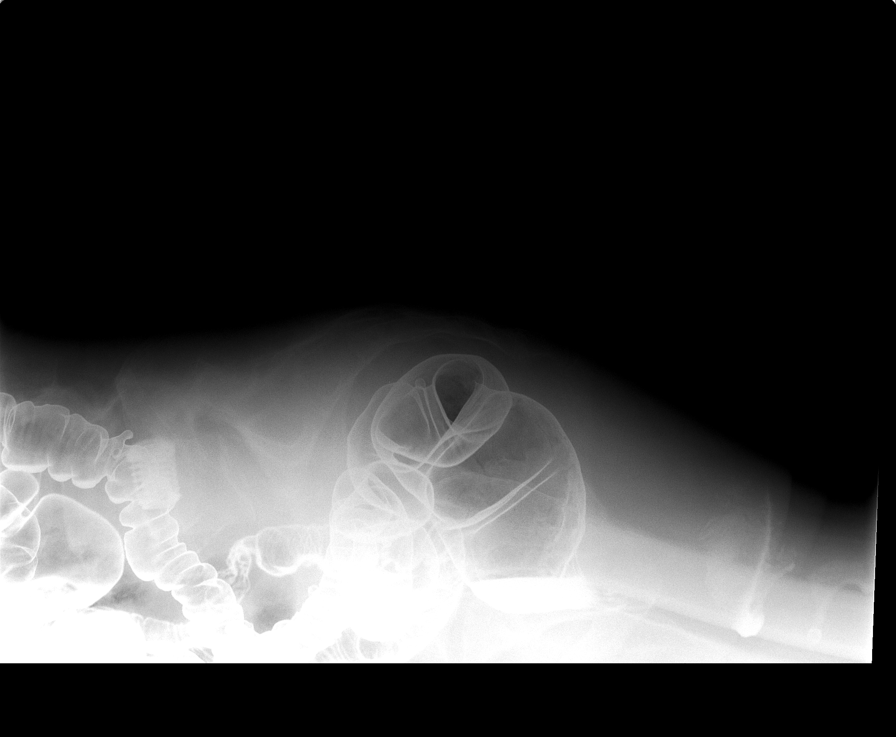

[12 of 22 positions shown; findings below may reference images not displayed]

FINDINGS: A preliminary scout film reveals paired ray cages at L4-5 and metallic cholecystectomy clips in the right upper quadrant. Barium and air were instilled in a retrograde fashion, opacifying the entire colon.  Fluoroscopic guided spot films and overhead radiographs were acquired.
FINDINGS: Normal colonic position and caliber.  Left colon diverticula predominantly involving the sigmoid colon.  No persistent polypoid, ulcerative, or constrictive lesion.  Distal ileum refluxed and appears normal.
IMPRESSION: Colonic diverticular disease, otherwise negative BE.

## 2007-10-06 ENCOUNTER — Emergency Department (HOSPITAL_COMMUNITY): Admission: EM | Admit: 2007-10-06 | Discharge: 2007-10-06 | Payer: Self-pay | Admitting: Emergency Medicine

## 2007-11-15 ENCOUNTER — Encounter: Admission: RE | Admit: 2007-11-15 | Discharge: 2007-11-15 | Payer: Self-pay | Admitting: Internal Medicine

## 2007-11-15 IMAGING — CT CT ABDOMEN W/ CM
2 of 5 series · 17 of 46 positions shown, 19 images · IV contrast (OMNI 350, WATER & [ID] OMNI 300)
Comparison: Abdominal pelvic CT [DATE].

CLINICAL DATA: Abdominal pain and nausea for 2 weeks.  History of
cholecystectomy.

CT ABDOMEN WITH CONTRAST
TECHNIQUE: Multidetector CT imaging of the abdomen was performed
following the standard protocol during bolus administration of
intravenous contrast.
Contrast: 125 ml [NS] intravenously.  Oral contrast was
given.

[Series 3: routine abdomen · axial · 0.78mm/px · z∈[-279,-69]mm · 14 of 50 slices shown, 16 images]
[im 4/50  soft-tissue]
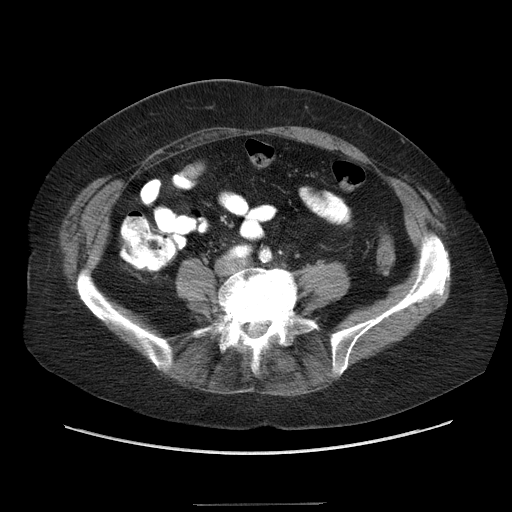
[im 4/50  bone]
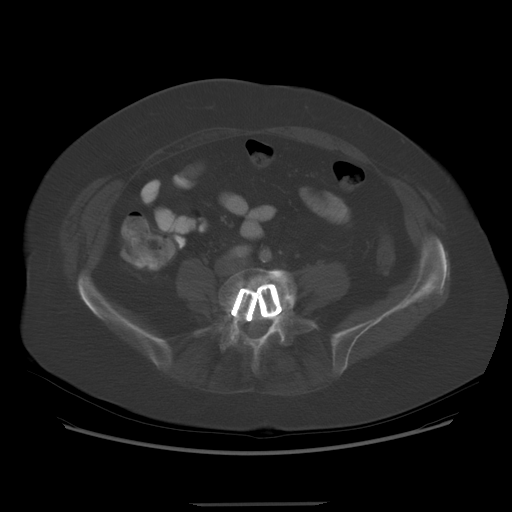
[im 7/50  soft-tissue]
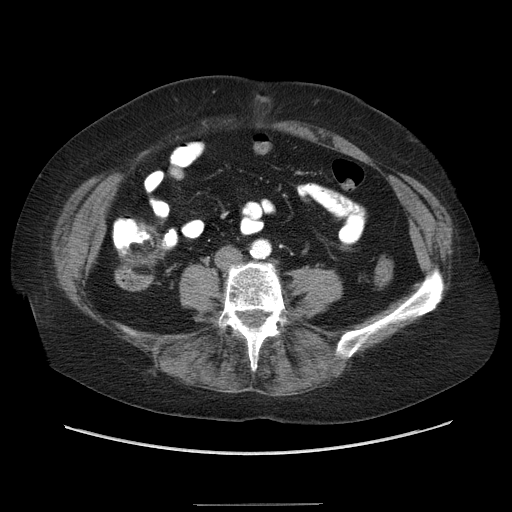
[im 10/50  soft-tissue]
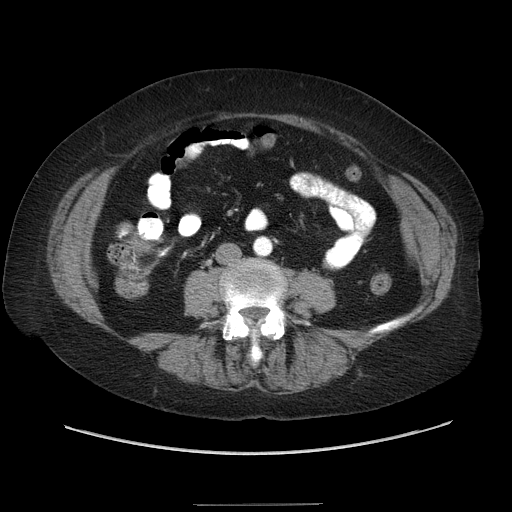
[im 13/50  soft-tissue]
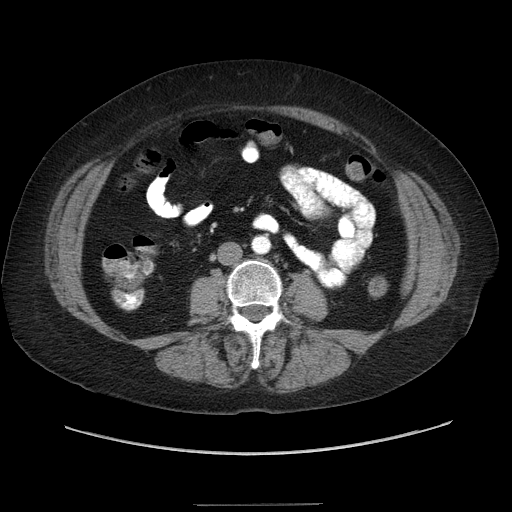
[im 16/50  soft-tissue]
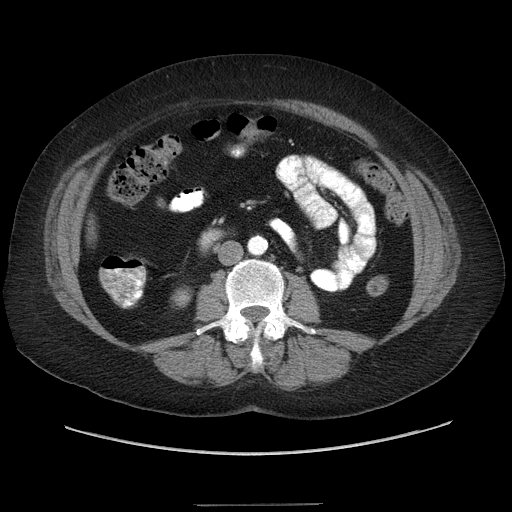
[im 19/50  soft-tissue]
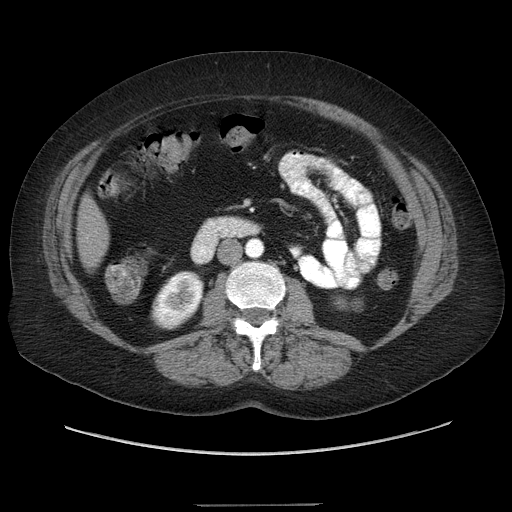
[im 22/50  soft-tissue]
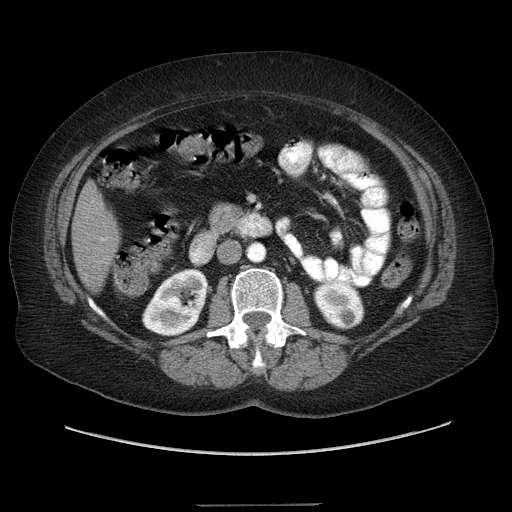
[im 28/50  soft-tissue]
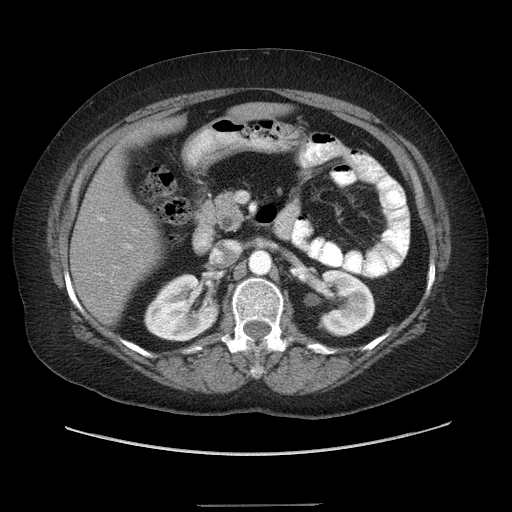
[im 31/50  soft-tissue]
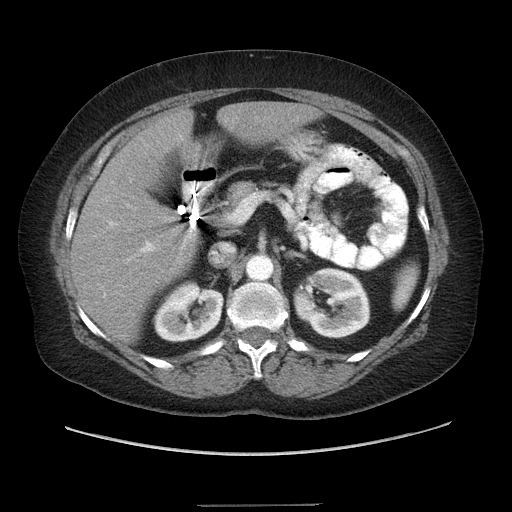
[im 31/50  bone]
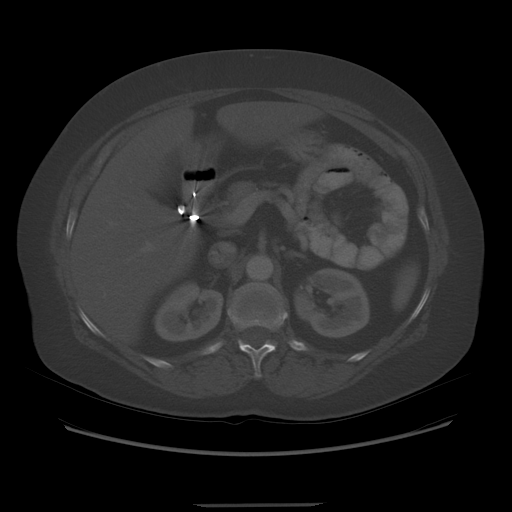
[im 34/50  soft-tissue]
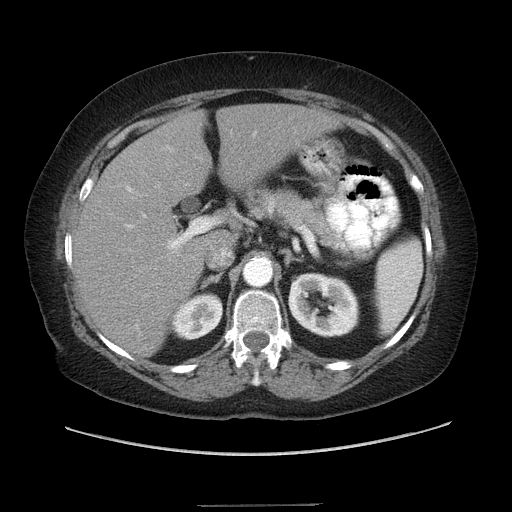
[im 37/50  soft-tissue]
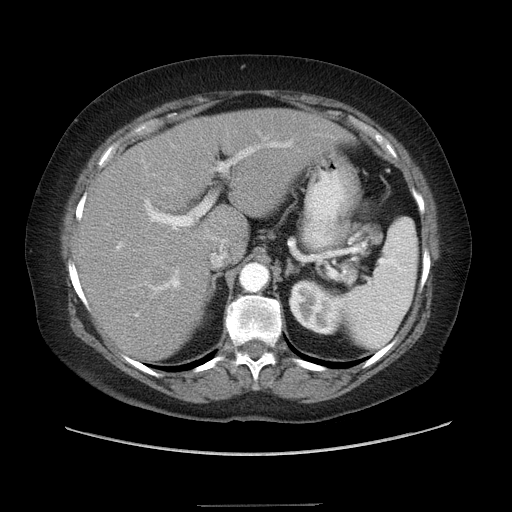
[im 40/50  soft-tissue]
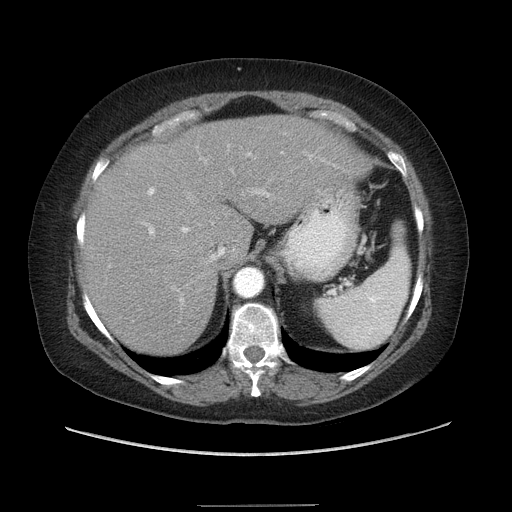
[im 43/50  soft-tissue]
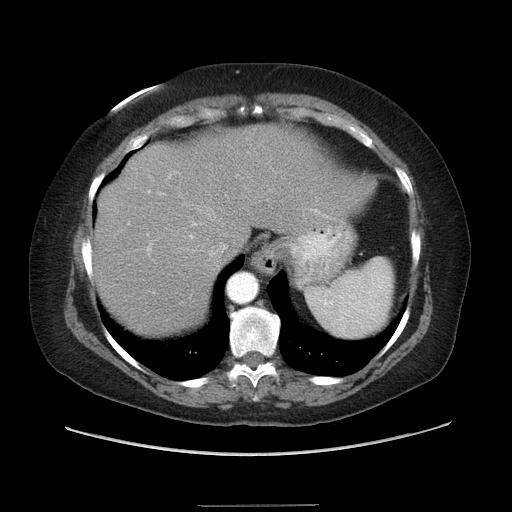
[im 46/50  soft-tissue]
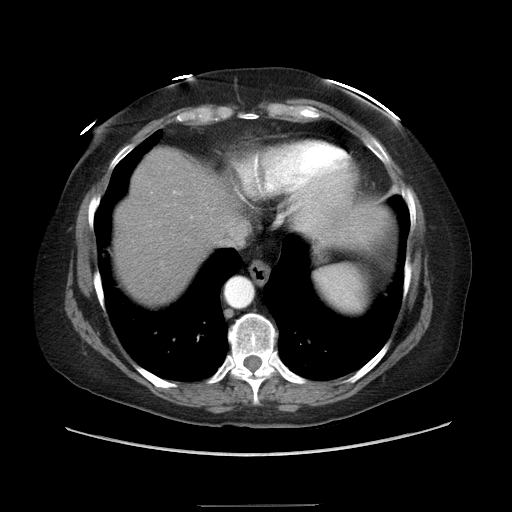

[Series 602: sagittal body · sagittal · 0.78mm/px · 3 of 159 slices shown]
[im 53/159  soft-tissue]
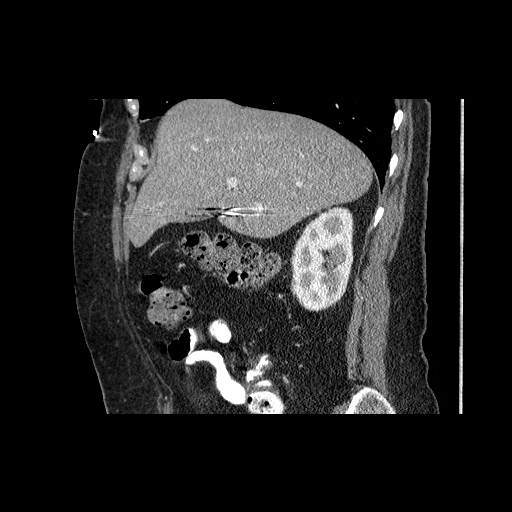
[im 71/159  soft-tissue]
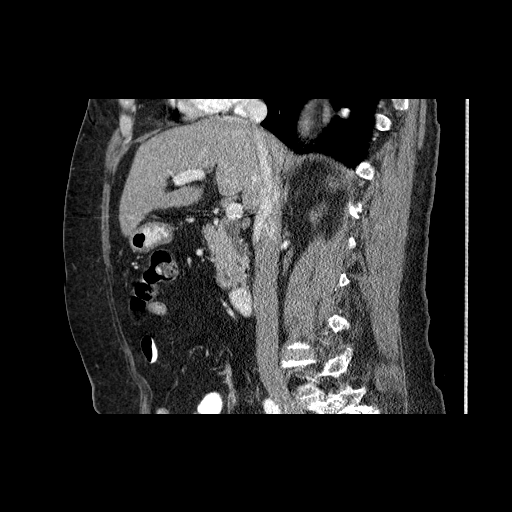
[im 88/159  soft-tissue]
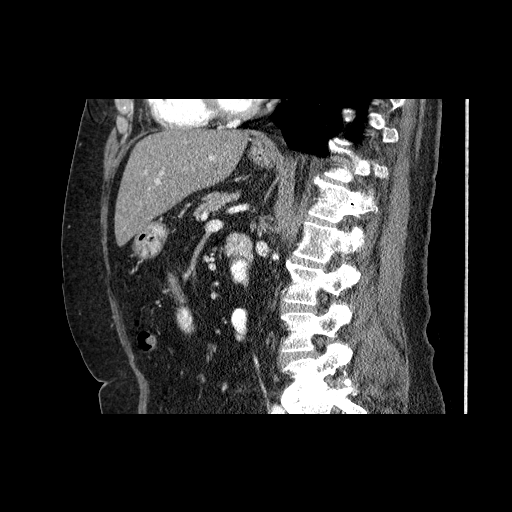

[17 of 46 positions shown; findings below may reference images not displayed]

FINDINGS: The lung bases are clear and there is no pleural
effusion.  The patient has developed mild diffuse fatty
infiltration of the liver.  No focal liver lesions are identified.
The common bile duct is mildly dilated to 11 mm status post
cholecystectomy.  The duct tapers distally, and there is no
evidence of pancreatic mass or pancreatic ductal dilatation.  The
biliary system appears stable compared with the prior examination.

The spleen and adrenal glands appear normal.  There are several
small renal lesions bilaterally which do not appear to be
enhancing.  The largest of these is in the lower pole of the right
kidney and measures 1.3 cm in diameter.  These have not
significantly changed since the prior study and are most compatible
with cysts.  There is no adenopathy, fluid collection or bowel
lesion.  Postsurgical changes are noted at the lumbar sacral
junction.  Pelvis is not imaged.
IMPRESSION: 1.  No acute abdominal findings demonstrated.
2.  Stable mild biliary dilatation status post cholecystectomy.
This is likely physiologic.
3.  Renal cortical cysts.
4.  Mild fatty infiltration of the liver.

## 2008-10-08 ENCOUNTER — Encounter: Admission: RE | Admit: 2008-10-08 | Discharge: 2008-10-08 | Payer: Self-pay | Admitting: Internal Medicine

## 2008-10-08 IMAGING — CT CT HEAD W/O CM
1 of 2 series · 16 of 30 positions shown, 20 images · non-contrast
Comparison: None available

CLINICAL DATA: Headaches, blurred vision.  Bilateral middle ear
repair

CT HEAD WITHOUT CONTRAST
TECHNIQUE: Contiguous axial images were obtained from the base of
the skull through the vertex without contrast.

[Series 2: head · axial · 0.49mm/px · z∈[+42,+172]mm · 16 of 28 slices shown, 20 images]
[im 2/28  brain]
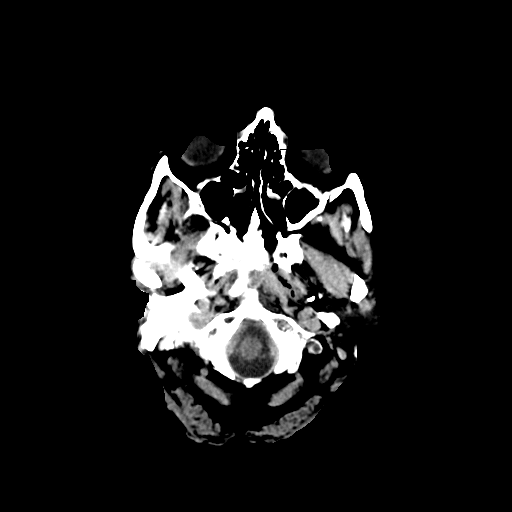
[im 2/28  bone]
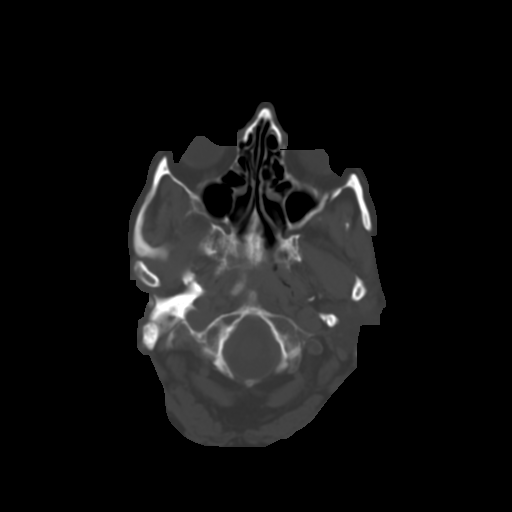
[im 4/28  brain]
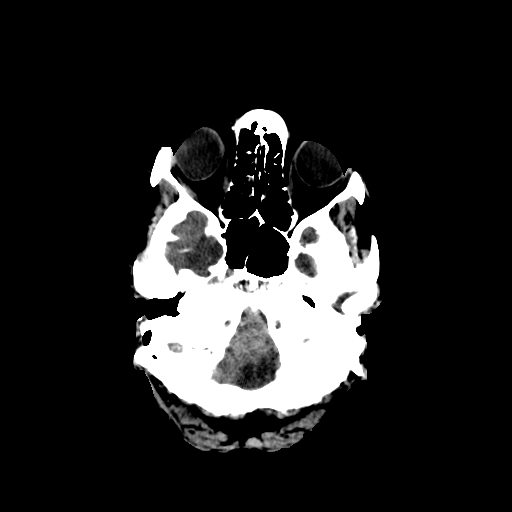
[im 5/28  brain]
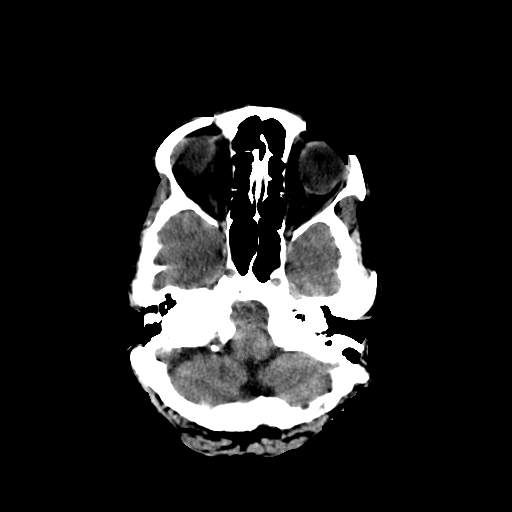
[im 7/28  brain]
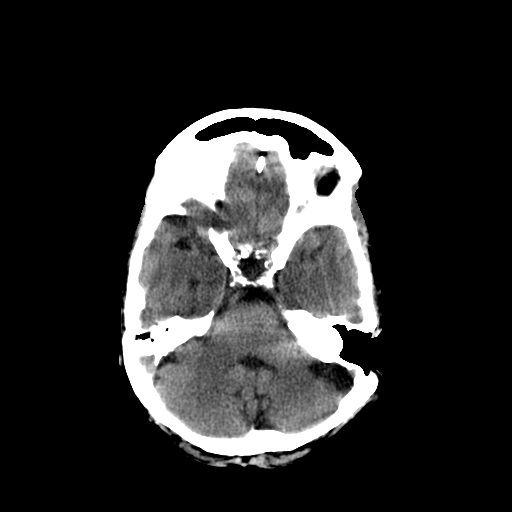
[im 8/28  brain]
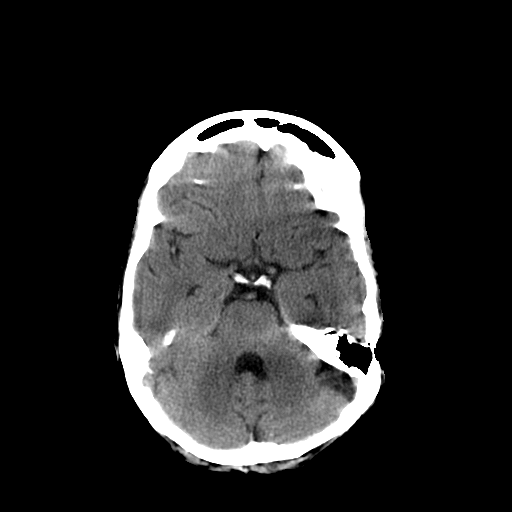
[im 8/28  bone]
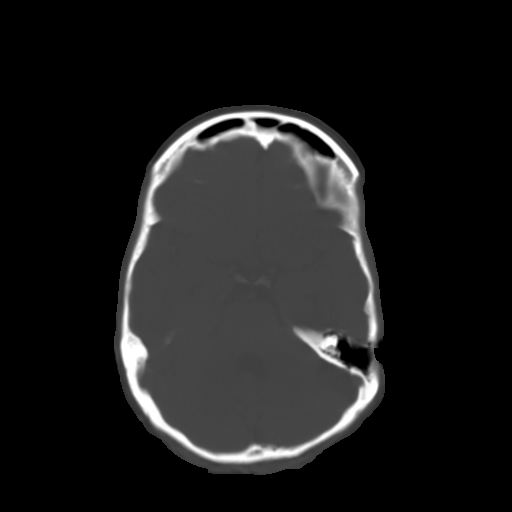
[im 10/28  brain]
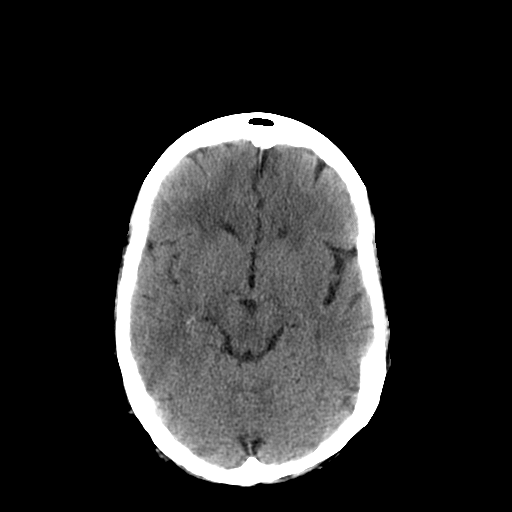
[im 11/28  brain]
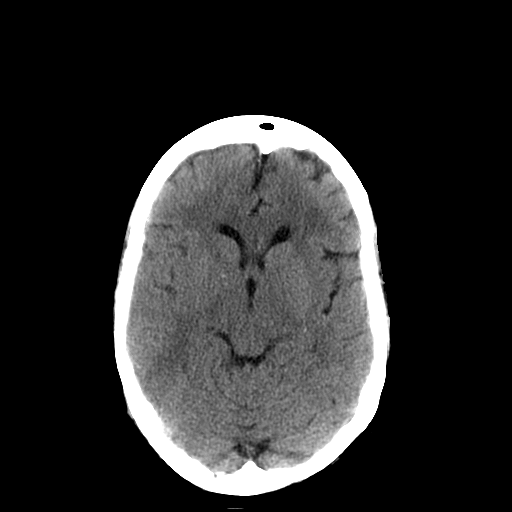
[im 13/28  brain]
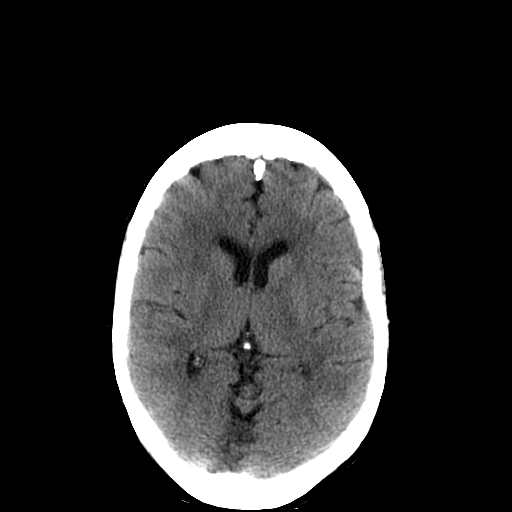
[im 16/28  brain]
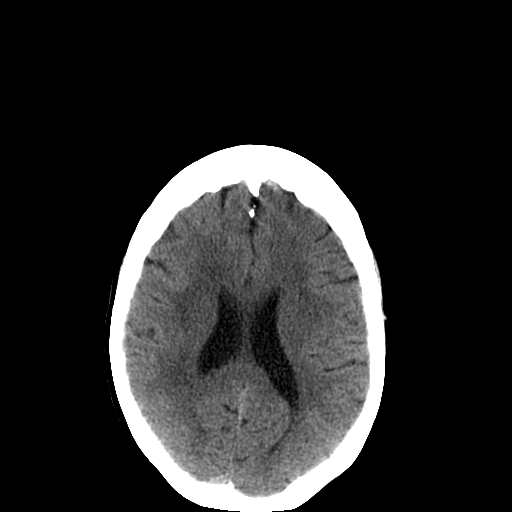
[im 16/28  bone]
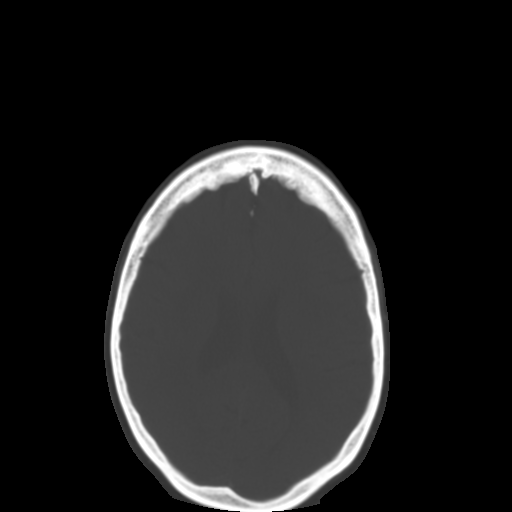
[im 17/28  brain]
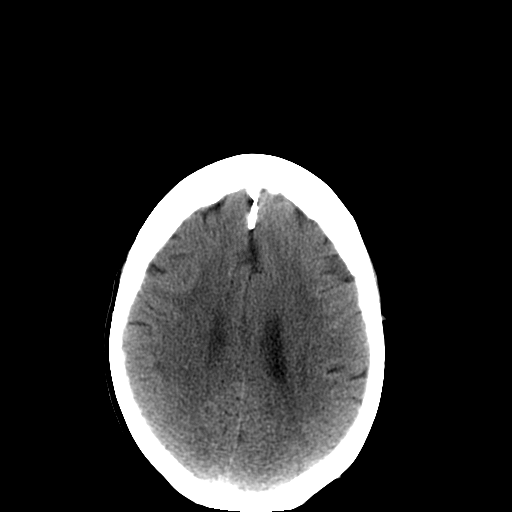
[im 19/28  brain]
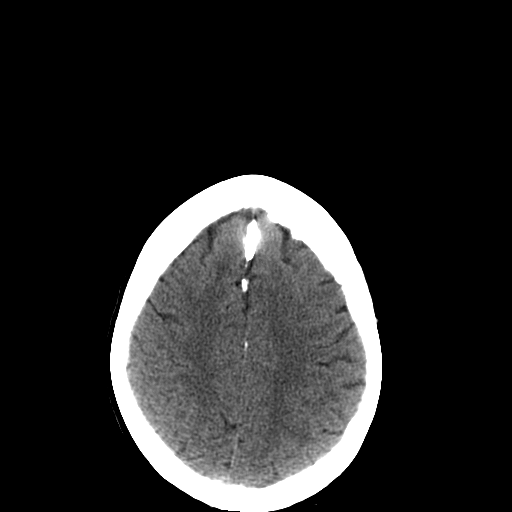
[im 20/28  brain]
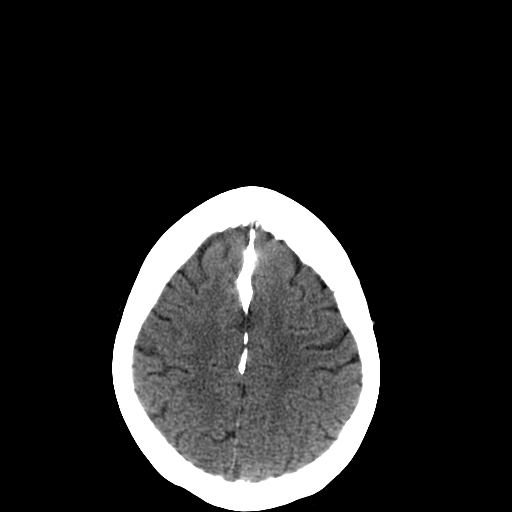
[im 22/28  brain]
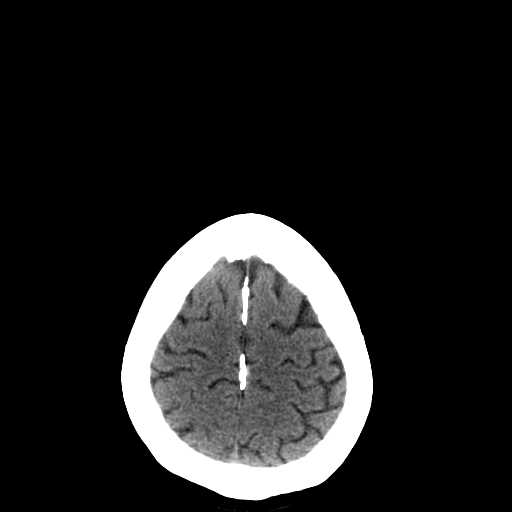
[im 22/28  bone]
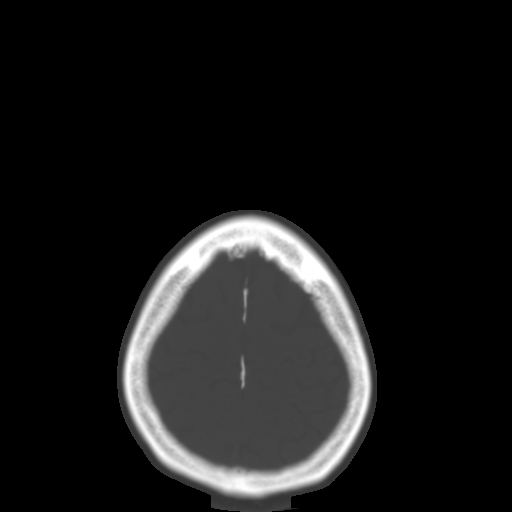
[im 23/28  brain]
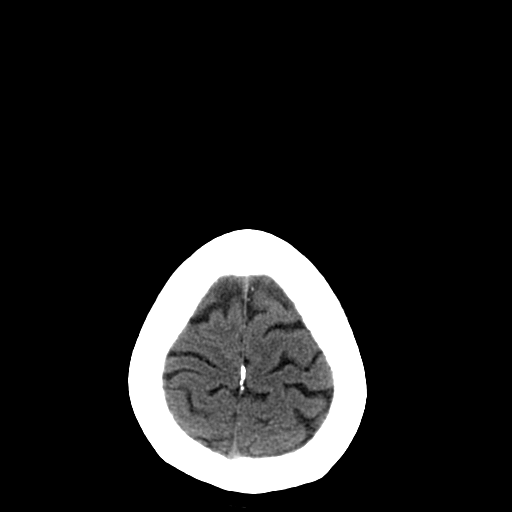
[im 25/28  brain]
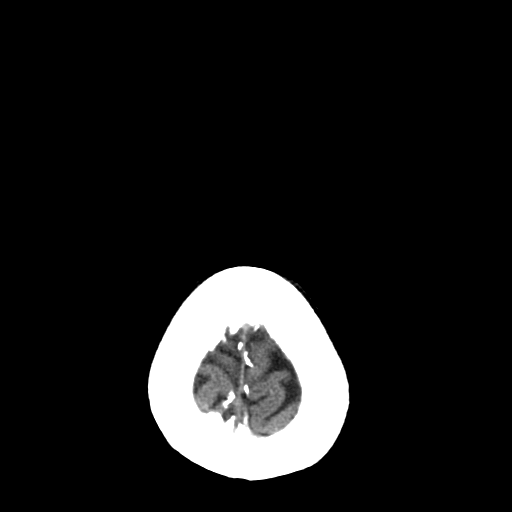
[im 26/28  brain]
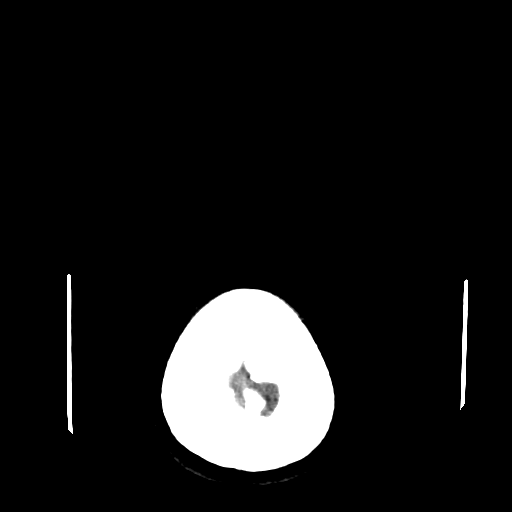

[16 of 30 positions shown; findings below may reference images not displayed]

FINDINGS: No evidence of acute intracranial hemorrhage.  No
hydrocephalus, midline shift, or mass effect.  No focal mass
lesion.  No CT evidence of acute infarction.  There is mild
periventricular and subcortical white matter  hypodensities.

There is a surgical change evident in the bilateral mastoid bones.
No evidence of acute abnormality at the skull base.  Paranasal
sinuses and orbits appear normal.
IMPRESSION: 1.  No acute intracranial findings.
2.  Mild periventricular and subcortical white matter disease
consist with small vessel ischemia.

## 2008-12-05 ENCOUNTER — Encounter: Admission: RE | Admit: 2008-12-05 | Discharge: 2008-12-05 | Payer: Self-pay | Admitting: Internal Medicine

## 2009-10-26 ENCOUNTER — Emergency Department (HOSPITAL_COMMUNITY): Admission: EM | Admit: 2009-10-26 | Discharge: 2009-10-26 | Payer: Self-pay | Admitting: Emergency Medicine

## 2009-10-26 IMAGING — CR DG KNEE COMPLETE 4+V*L*
4 series · 4 of 4 positions shown · non-contrast
Comparison: None.

CLINICAL DATA: Twisting knee injury with knee pain.

LEFT KNEE - COMPLETE 4+ VIEW

[view not recorded (1 of 4)]
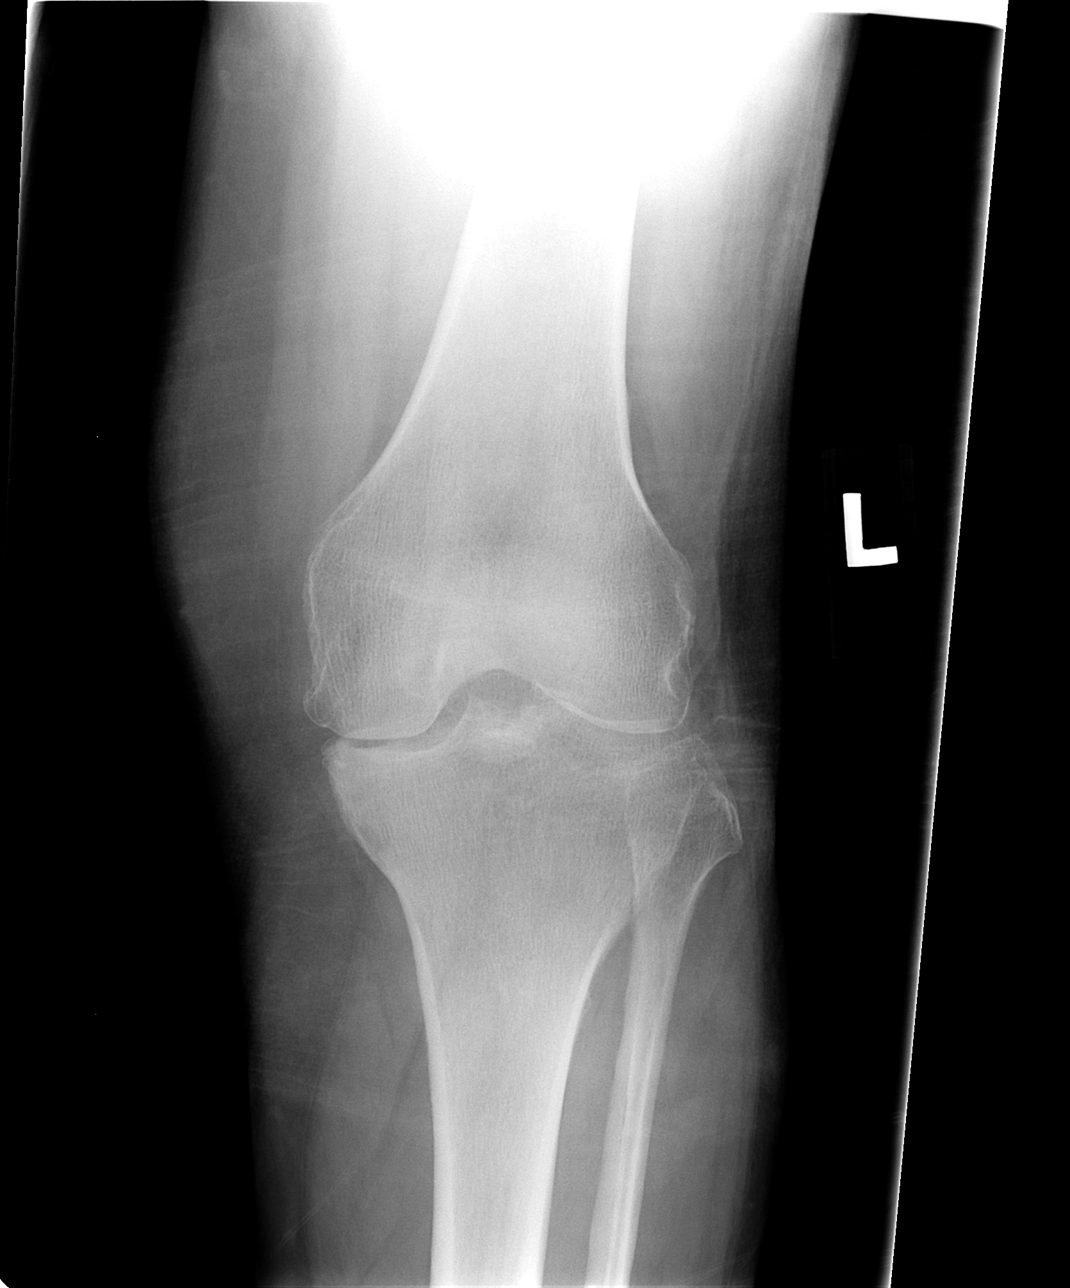

[view not recorded (2 of 4)]
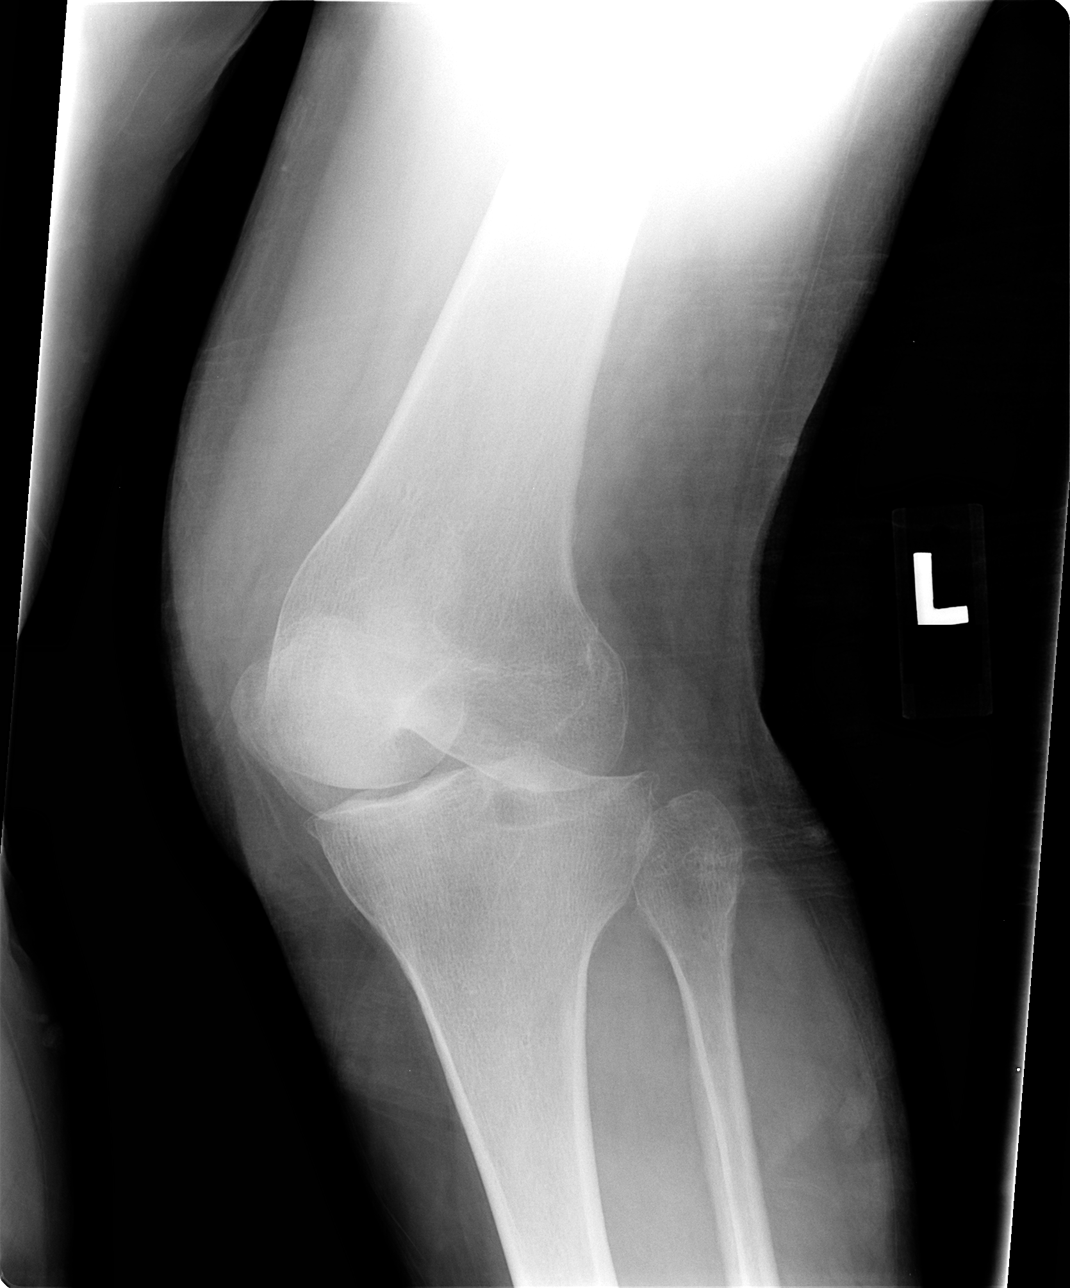

[view not recorded (3 of 4)]
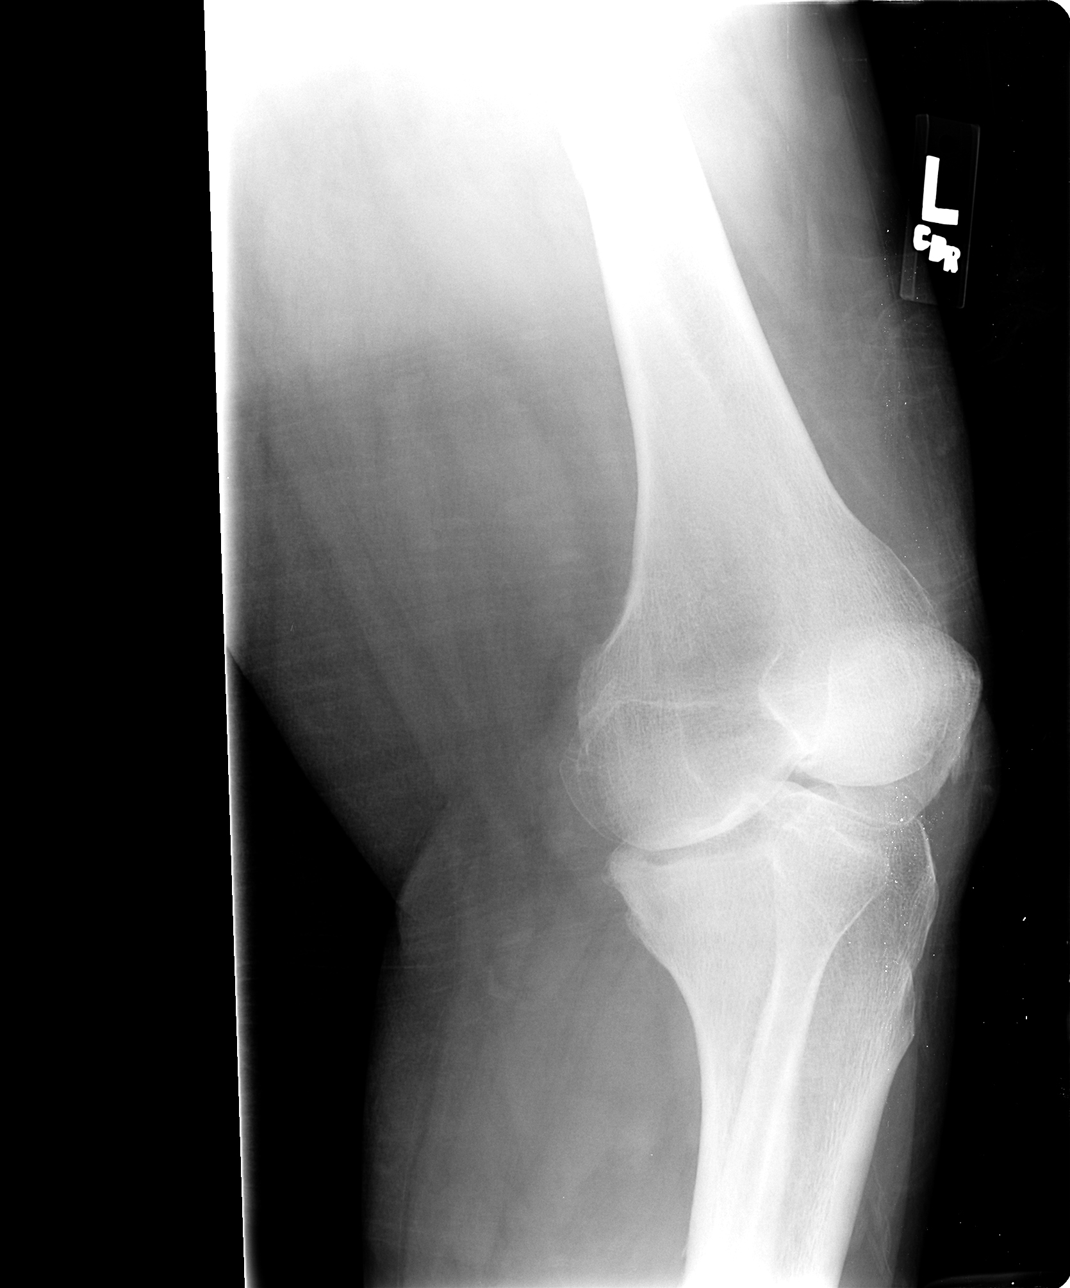

[view not recorded (4 of 4)]
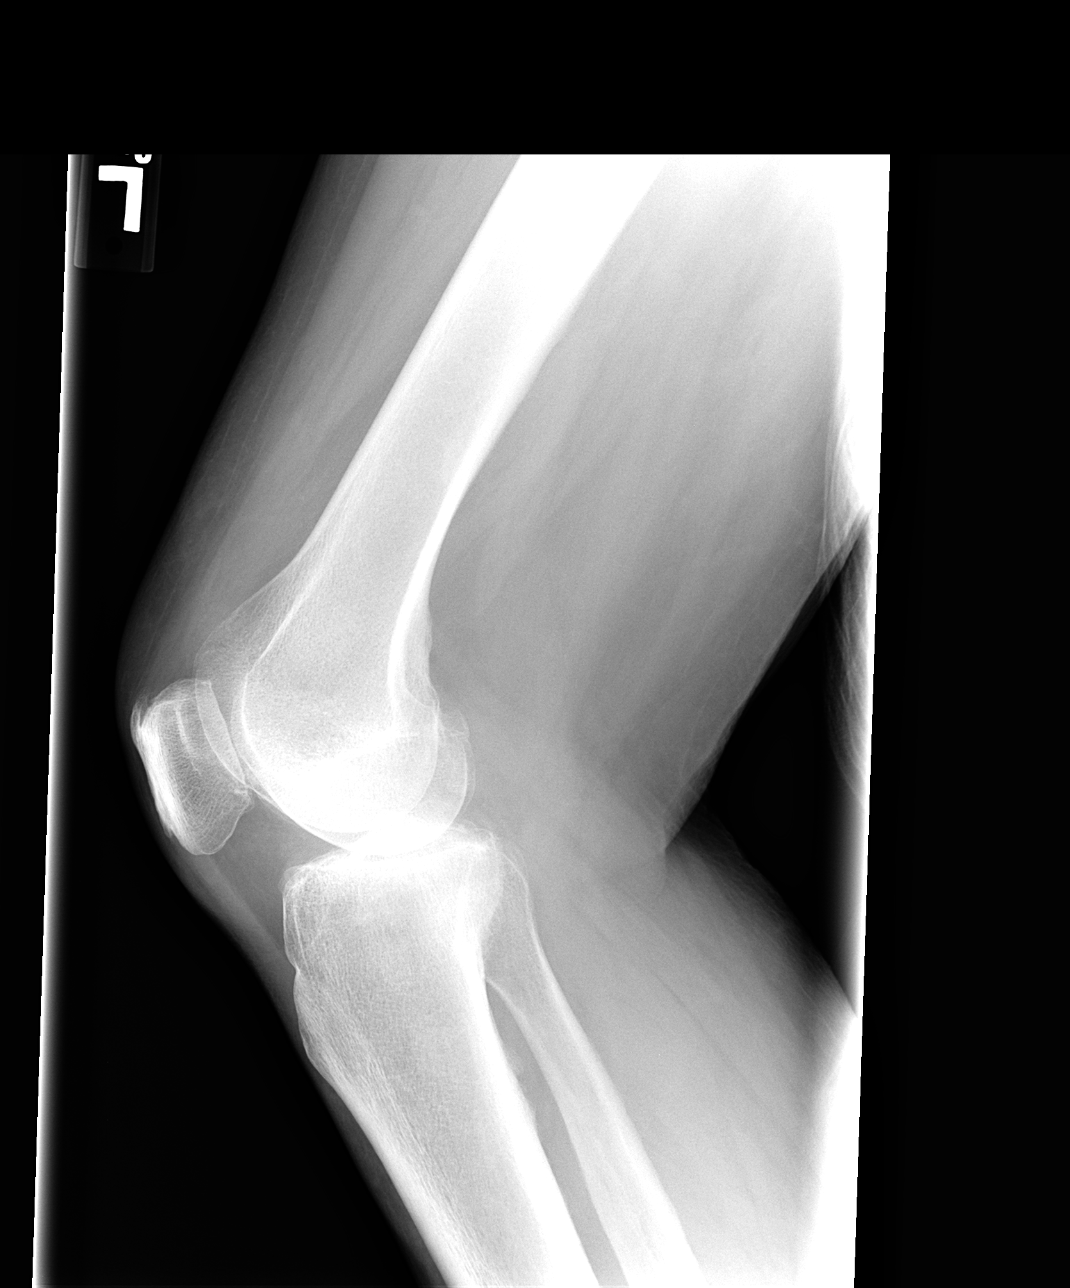

[4 of 4 positions shown; findings below may reference images not displayed]

FINDINGS: There is prominent tri-compartmental loss articular space
compatible with osteoarthritis.  The overlap of the condyles and
tibial plateau due to the loss of articular space and obliquity of
imaging reduces sensitivity for fracture, and there is a suggestion
of mild impaction of the lateral tibial plateau.  Equivocal
appearance for knee effusion.
IMPRESSION: 1.  Prominent tri-compartmental loss of articular space.
2.  Suspected mild impaction along the lateral tibial plateau,
although a well-defined fracture line is not visible.
[DATE] be helpful for further delineation of internal
derangement, if clinically warranted.

## 2009-10-27 ENCOUNTER — Encounter: Admission: RE | Admit: 2009-10-27 | Discharge: 2009-10-27 | Payer: Self-pay | Admitting: Orthopedic Surgery

## 2009-10-27 IMAGING — CT CT EXTREM LOW W/O CM*L*
2 of 4 series · 4 of 14 positions shown, 5 images · non-contrast
Comparison: Radiographs obtained yesterday.

CLINICAL DATA: Left knee pain and swelling following a twisting
injury on [DATE].  Findings suspicious for mild impaction along
the lateral tibial plateau on left knee radiographs obtained
yesterday.

CT LEFT KNEE WITHOUT CONTRAST
TECHNIQUE: Multidetector CT imaging of the left knee was performed
according to the standard protocol without intravenous contrast.
Multiplanar CT image reconstructions were also generated.

[Series 5: left knee/bone · axial · 0.31mm/px · z∈[-191,-126]mm · 2 of 78 slices shown, 3 images]
[im 26/78  soft-tissue]
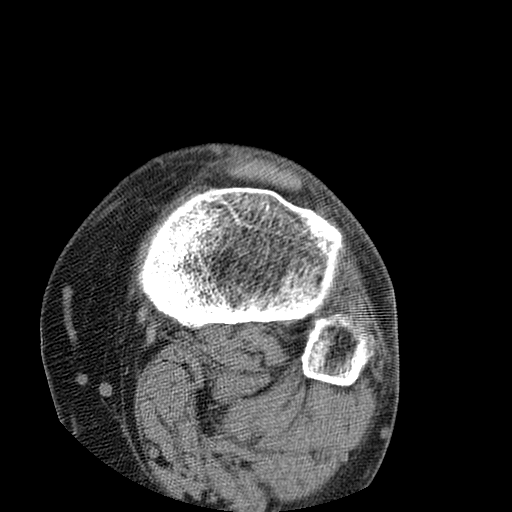
[im 26/78  bone]
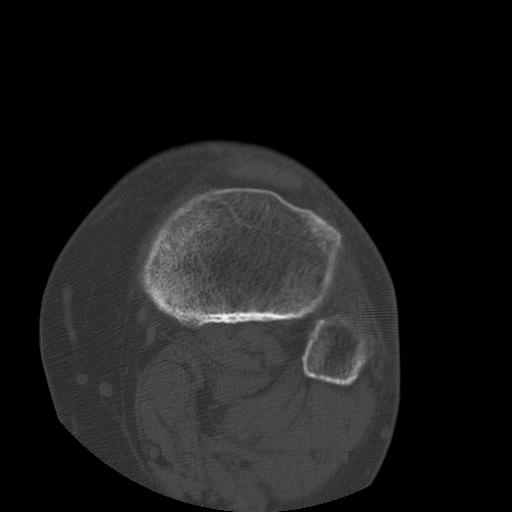
[im 52/78  bone]
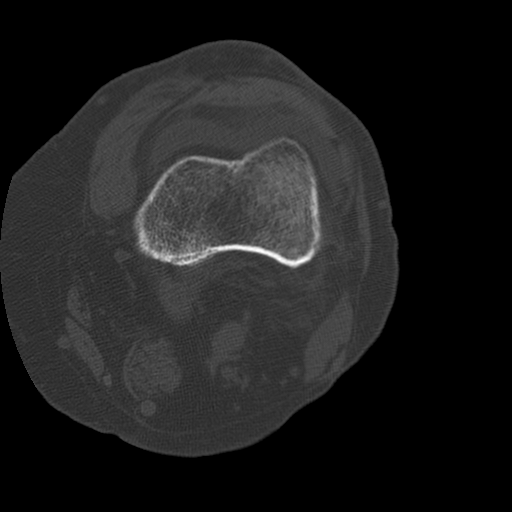

[Series 6: left knee/detail · axial · 0.31mm/px · z∈[-191,-126]mm · 2 of 78 slices shown]
[im 26/78  bone]
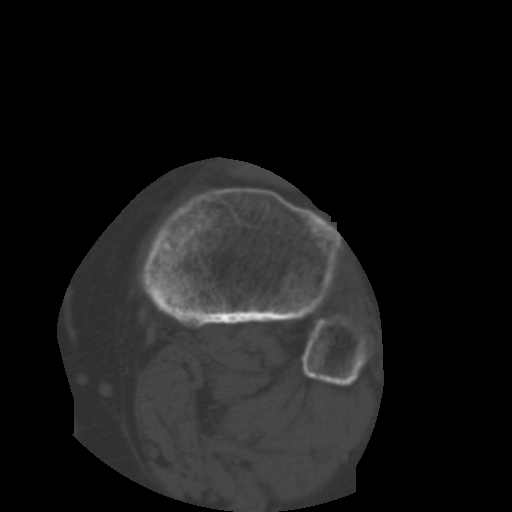
[im 52/78  bone]
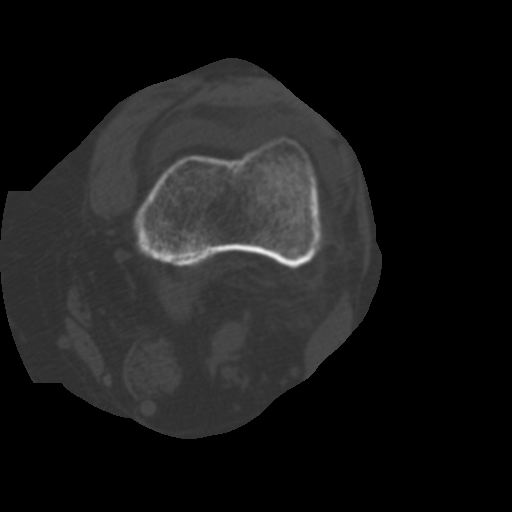

[4 of 14 positions shown; findings below may reference images not displayed]

FINDINGS: Mild spur formation involving all three joint
compartments.  Moderate anterior patellar spur formation.  Moderate
to marked medial joint space narrowing with an osteochondral defect
in the medial tibial plateau with associated cystic changes and
sclerosis.  The lateral tibial plateau is intact with no lateral
tibial plateau fracture demonstrated.  A small to moderate sized
effusion is noted.
IMPRESSION: 1.  No fracture.
2.  Small to moderate sized effusion.
3.  Tricompartmental degenerative changes, most pronounced
involving the medial compartment, as described above.

## 2010-01-22 HISTORY — PX: HIP SURGERY: SHX245

## 2010-01-31 ENCOUNTER — Inpatient Hospital Stay (HOSPITAL_COMMUNITY): Admission: EM | Admit: 2010-01-31 | Discharge: 2010-02-04 | Payer: Self-pay | Admitting: Emergency Medicine

## 2010-01-31 IMAGING — CR DG CHEST 1V PORT
1 series · 1 of 1 positions shown · non-contrast
Comparison: None

CLINICAL DATA: Fell off a horse.

PORTABLE CHEST - 1 VIEW

[view not recorded]
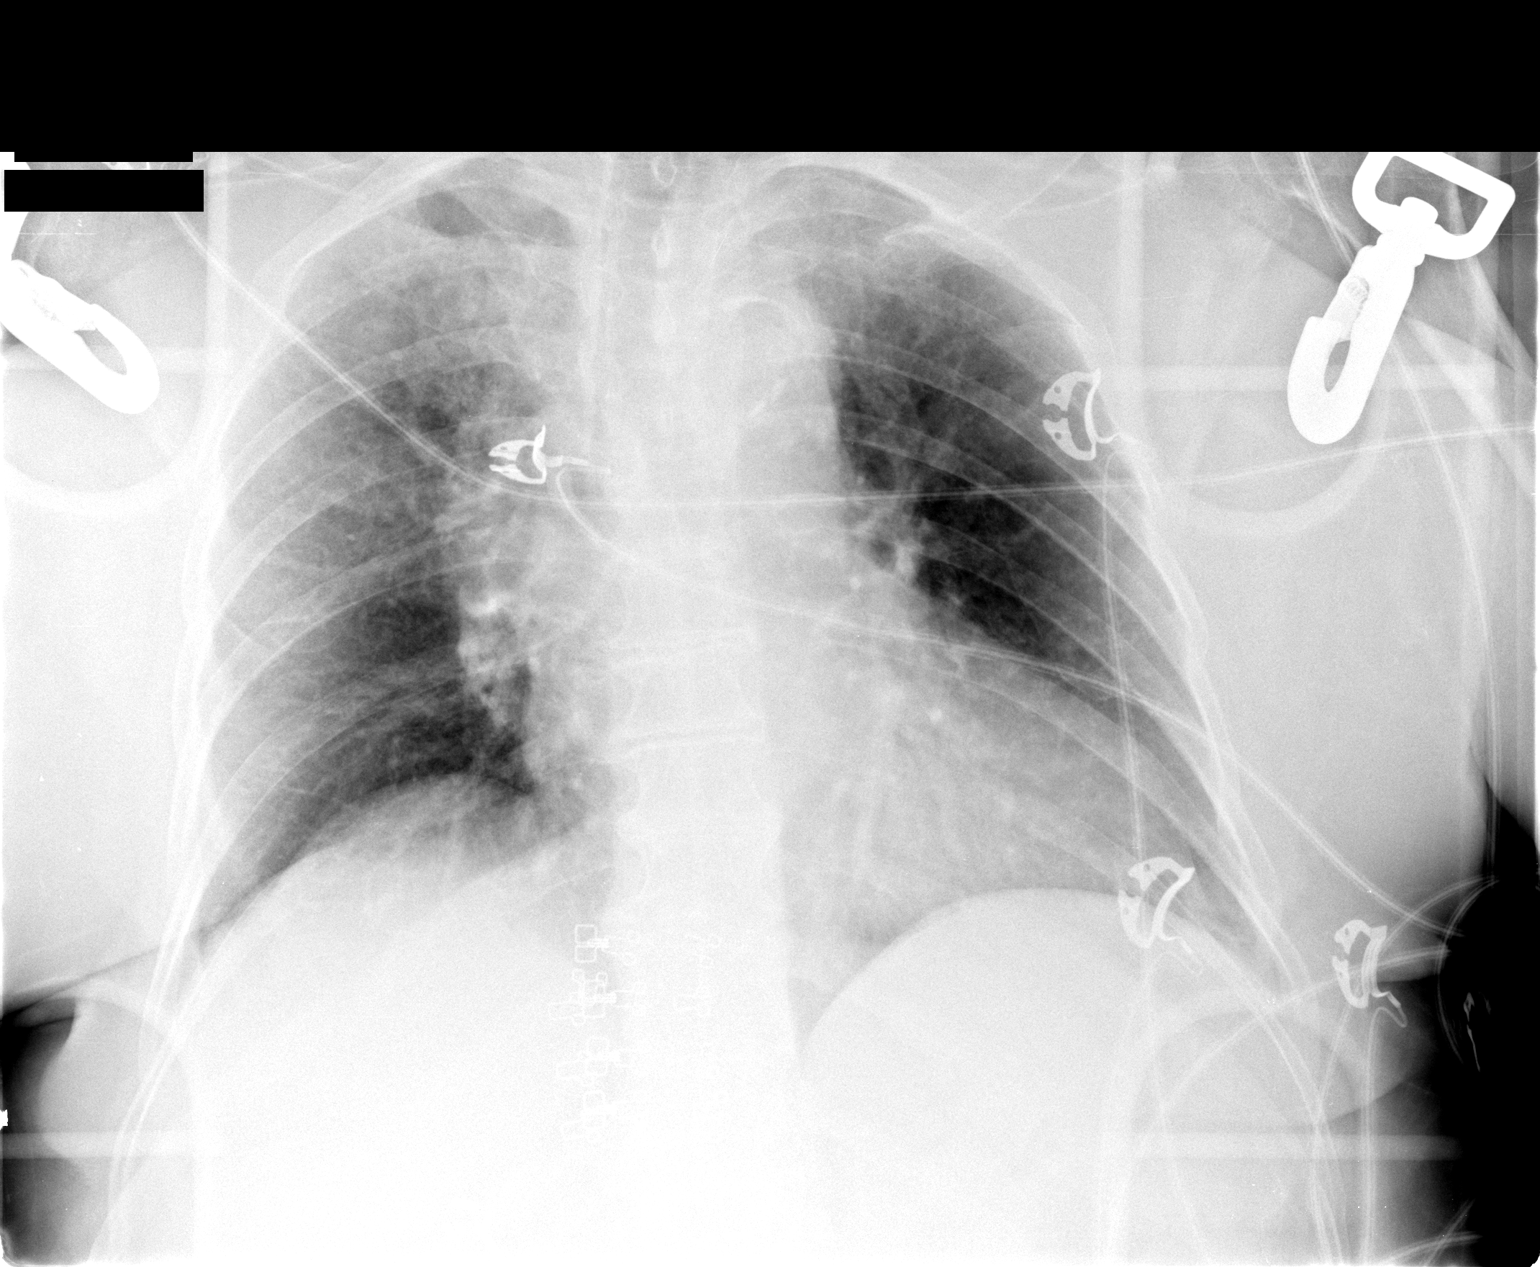

[1 of 1 positions shown; findings below may reference images not displayed]

FINDINGS: The cardiac silhouette, mediastinal and hilar contours
are within normal limits given the supine position of the patient
and the AP projection of the film.  There is tortuosity and
calcification of the thoracic aorta.  Right upper lobe density may
reflect atelectasis or pulmonary contusion.  No definite right-
sided rib fractures.  No pneumothorax.
IMPRESSION: 1.  Right upper lobe atelectasis or contusion.
2.  No definite rib fractures or pneumothorax.

## 2010-01-31 IMAGING — CT CT ABD-PELV W/ CM
4 of 5 series · 13 of 46 positions shown, 18 images · IV contrast (100 ML OMNI 300)
Comparison: None

CT CHEST

CLINICAL DATA: Trauma.  Fell from a horse.

CT CHEST, ABDOMEN AND PELVIS WITH CONTRAST
TECHNIQUE: Multidetector CT imaging of the chest, abdomen and
pelvis was performed following the standard protocol during bolus
administration of intravenous contrast.
Contrast: 100 ml of [ZQ]

[Series 2: chest/abd/pelvis · axial · 0.91mm/px · z∈[-586,-161]mm · 7 of 122 slices shown]
[im 15/122  soft-tissue]
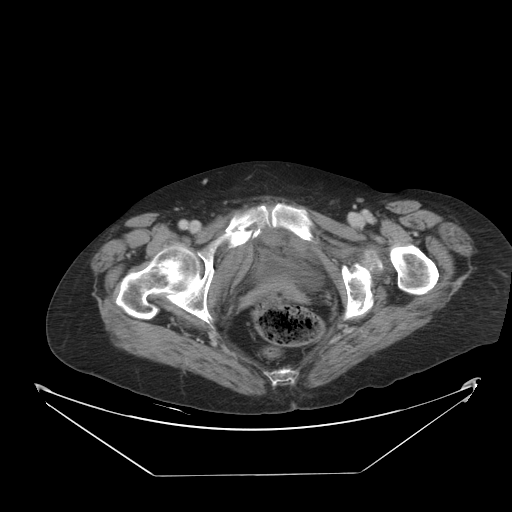
[im 29/122  soft-tissue]
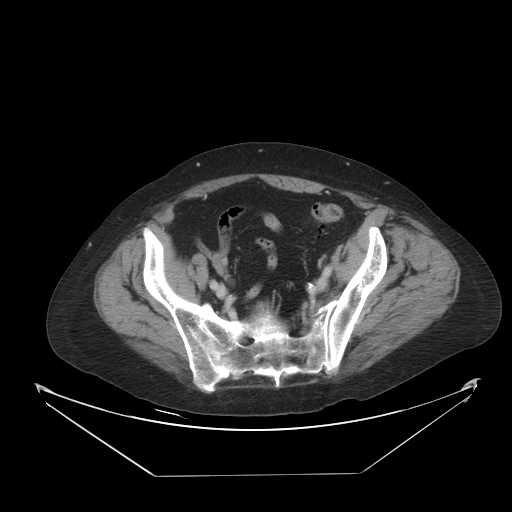
[im 43/122  soft-tissue]
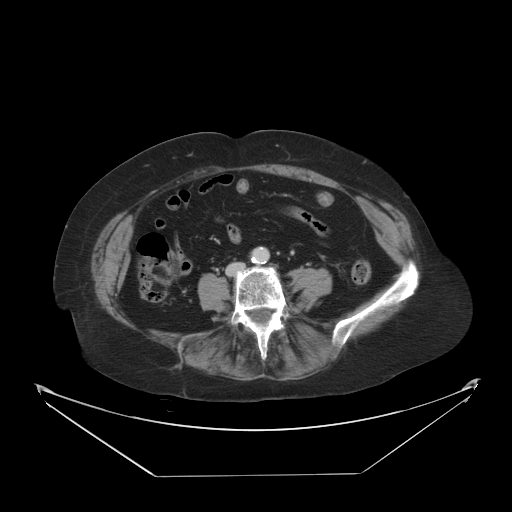
[im 57/122  soft-tissue]
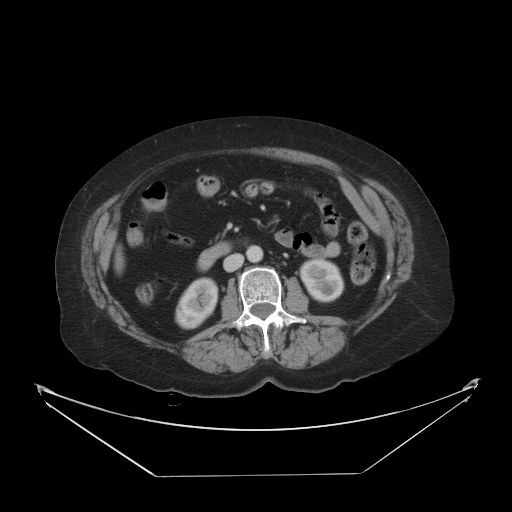
[im 72/122  soft-tissue]
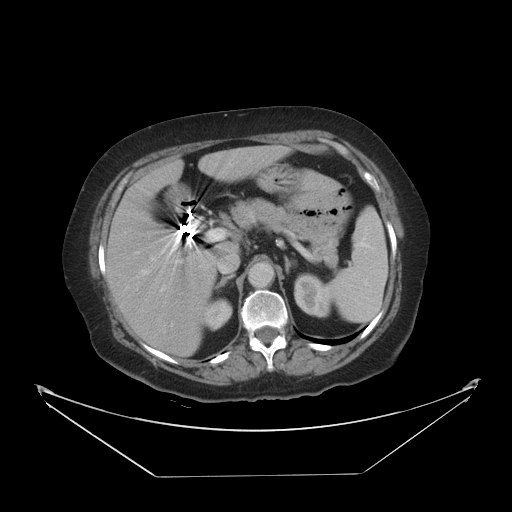
[im 86/122  soft-tissue]
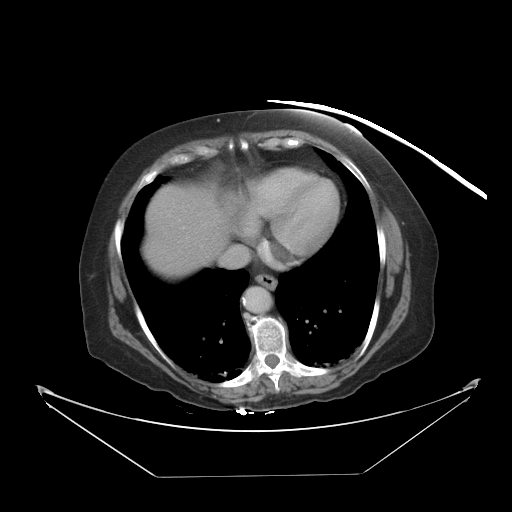
[im 100/122  soft-tissue]
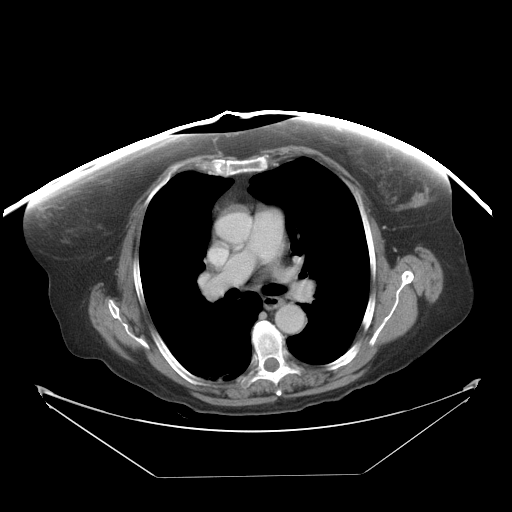

[Series 5: renal delays · axial · 0.91mm/px · z∈[-361,-326]mm · 2 of 23 slices shown, 5 images]
[im 8/23  soft-tissue]
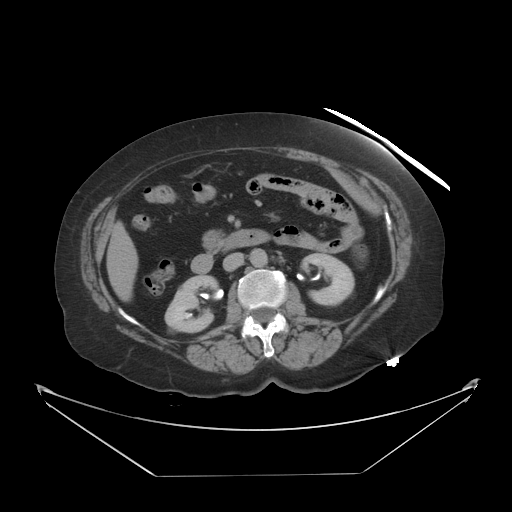
[im 8/23  lung]
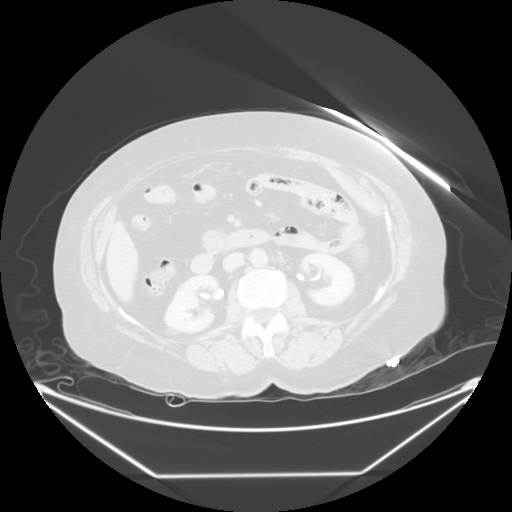
[im 8/23  bone]
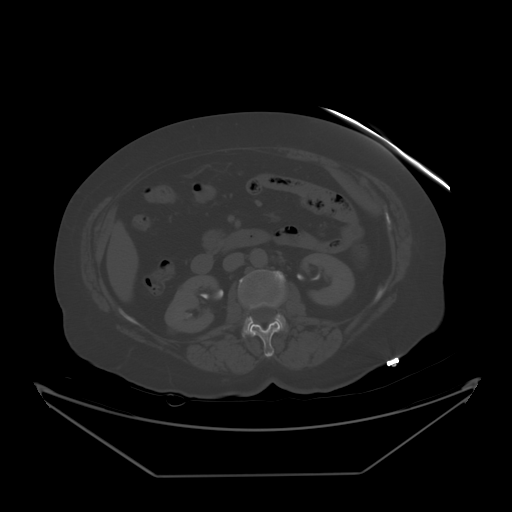
[im 15/23  soft-tissue]
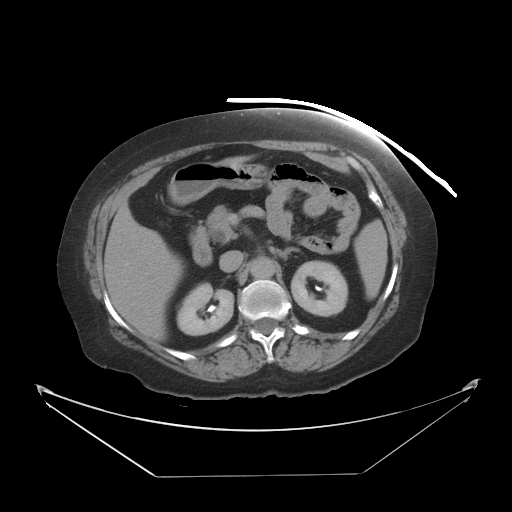
[im 15/23  lung]
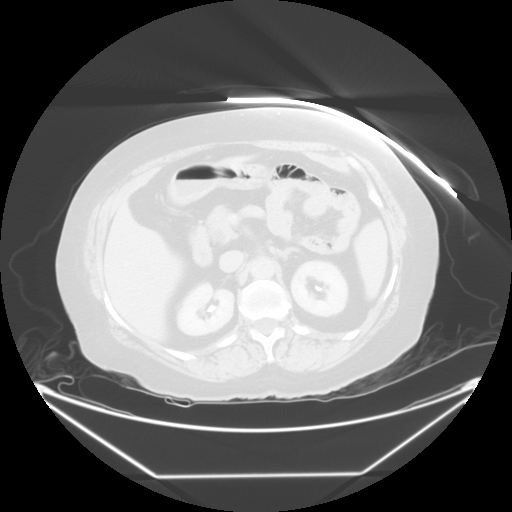

[Series 400: sag · sagittal · 1.21mm/px · 1 of 126 slices shown, 2 images]
[im 42/126  soft-tissue]
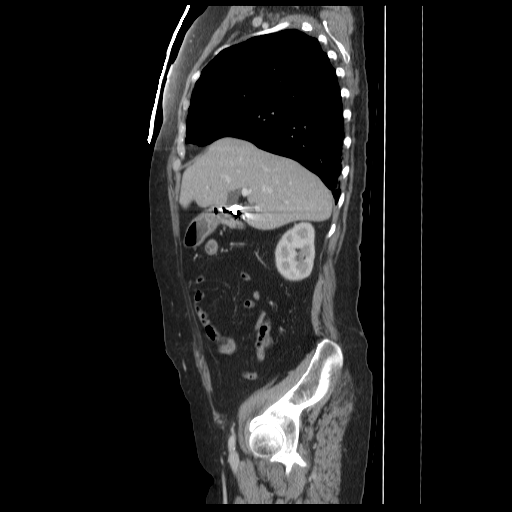
[im 42/126  bone]
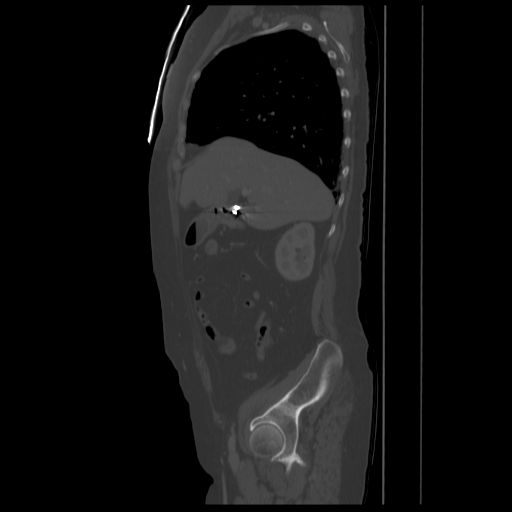

[Series 401: cor · coronal · 1.21mm/px · 3 of 94 slices shown, 4 images]
[im 32/94  soft-tissue]
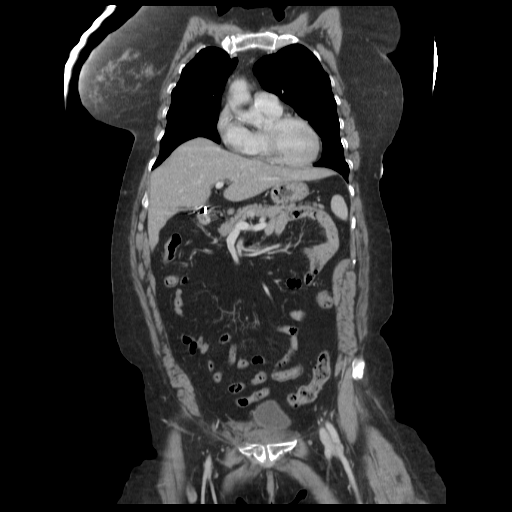
[im 42/94  soft-tissue]
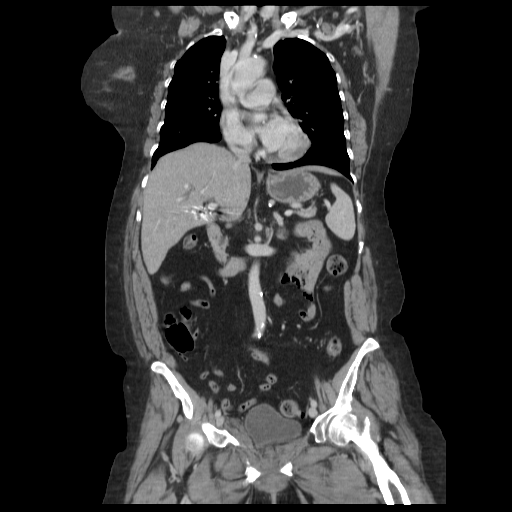
[im 42/94  bone]
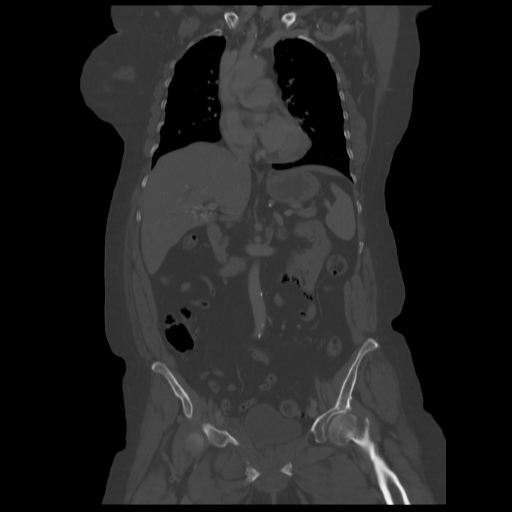
[im 52/94  soft-tissue]
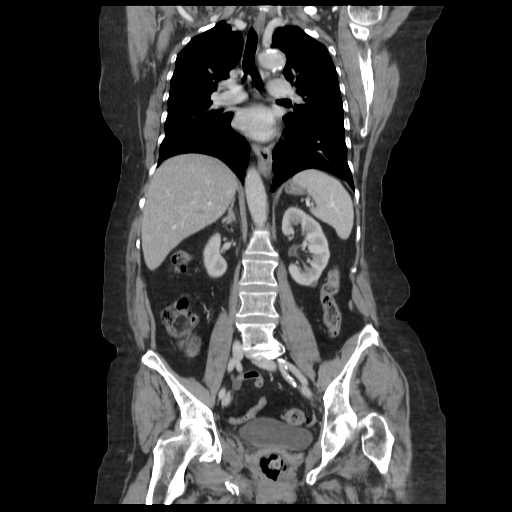

[13 of 46 positions shown; findings below may reference images not displayed]

FINDINGS: The chest wall is unremarkable.  No obvious breast
masses, supraclavicular or axillary adenopathy.  The bony thorax is
intact.  No definite rib fractures.

The heart is normal in size.  No pericardial effusion.  No
mediastinal or hilar adenopathy or hematoma.  The aorta
demonstrates atherosclerotic changes but no dissection or aneurysm.
The esophagus is grossly normal. There is a small hiatal hernia.

Examination of the lung parenchyma demonstrates COPD changes but no
acute pulmonary findings.  Dependent bibasilar atelectasis is
noted.  There are two small pulmonary nodules.  One is in the left
upper lobe on image number 18 and the other is in the left lower
lobe on image number 42.  Recommend follow-up noncontrast chest CT
examination in 6 months to document stability. No pleural
effusions.
IMPRESSION: 1.  No significant acute pulmonary findings and intact bony thorax.
2.  Two small pulmonary nodules as discussed above.
3.  Small hiatal hernia.

CT ABDOMEN AND PELVIS
FINDINGS: The liver is intact.  Mild biliary dilatation due to
prior cholecystectomy.  The spleen is normal.  The pancreas is
unremarkable.

The adrenal glands and kidneys demonstrate no acute findings.
There are several renal cysts.  There are small bilateral a
slightly hyperdense low attenuation lesions which may be
hemorrhagic cysts.  A repeat study in 6 months is suggested to
reevaluate.

The stomach, duodenum, small bowel and colon unremarkable.  No
mesenteric or retroperitoneal mass, adenopathy or hematoma.  No
mesenteric or retroperitoneal masses, adenopathy or hematoma.  The
aorta is normal in caliber.  The major branch vessels are normal.

There are superior and inferior pubic rami fractures on the right
side.  There is associated small extraperitoneal pelvic hematoma.
There is also a displaced left femoral neck fracture. Left-sided
sacral fractures are also noted.  Ray cages are noted at L4-L5.
IMPRESSION: 1.  No acute intra abdominal/pelvic findings, mass lesions or
adenopathy.
2.  Left sacral fractures, displaced left femoral neck fracture and
right-sided superior and inferior pubic rami fractures.
3.  Extra peritoneal pelvic hematoma.
4.  The small indeterminate renal lesions bilaterally.  Recommend
follow-up examination in 6 months.
5.  Status post cholecystectomy with mild biliary dilatation.

## 2010-01-31 IMAGING — CR DG HIP (WITH OR WITHOUT PELVIS) 2-3V*L*
3 series · 3 of 3 positions shown · non-contrast
Comparison: Intraoperative left hip images performed earlier today
at [DATE] p.m.

CLINICAL DATA: Status post left hip fracture repair.  Evaluate
hardware.

LEFT HIP - COMPLETE 2+ VIEW

[cross table lat hip]
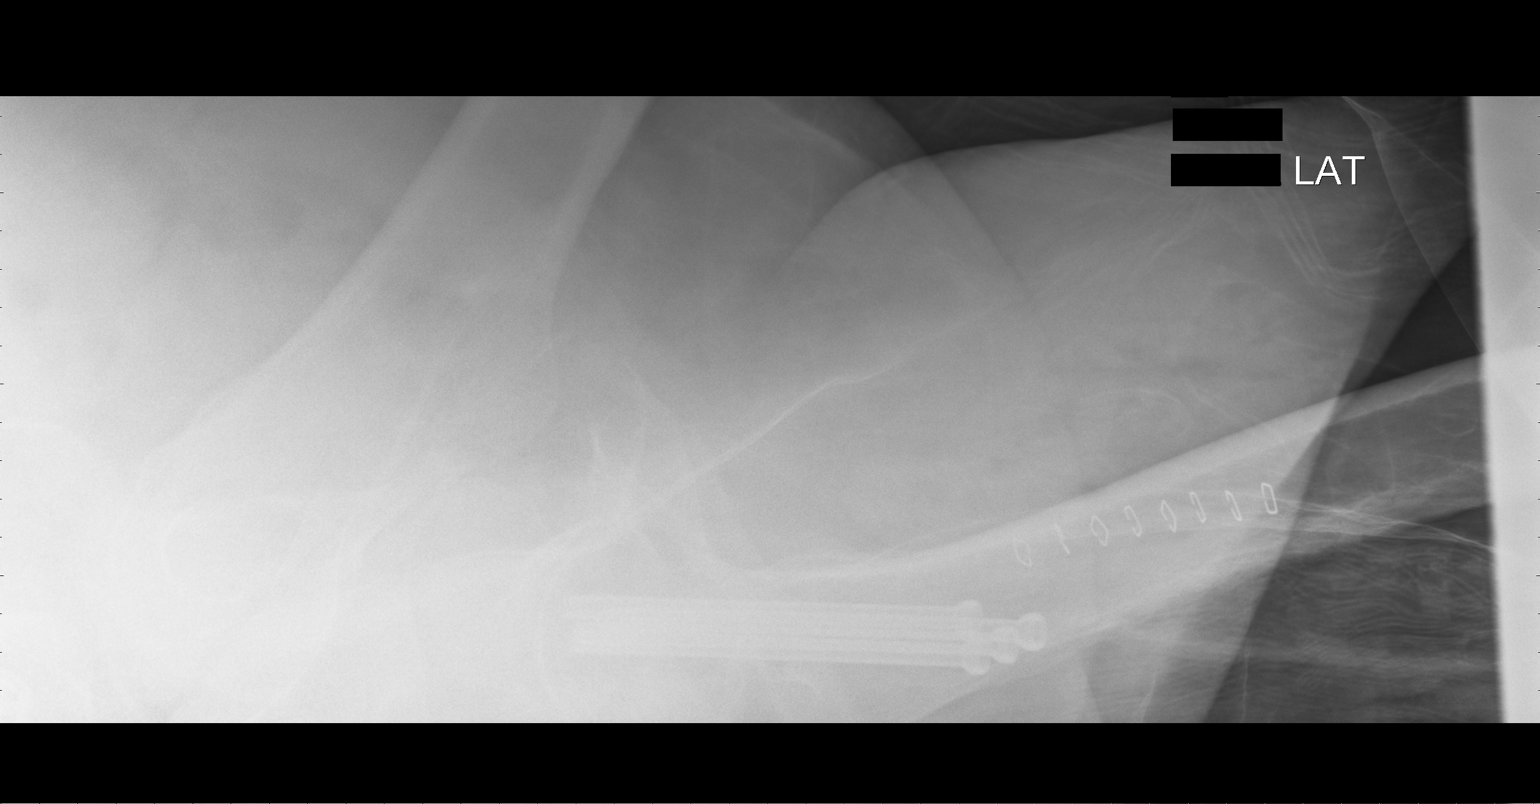

[view not recorded (1 of 2)]
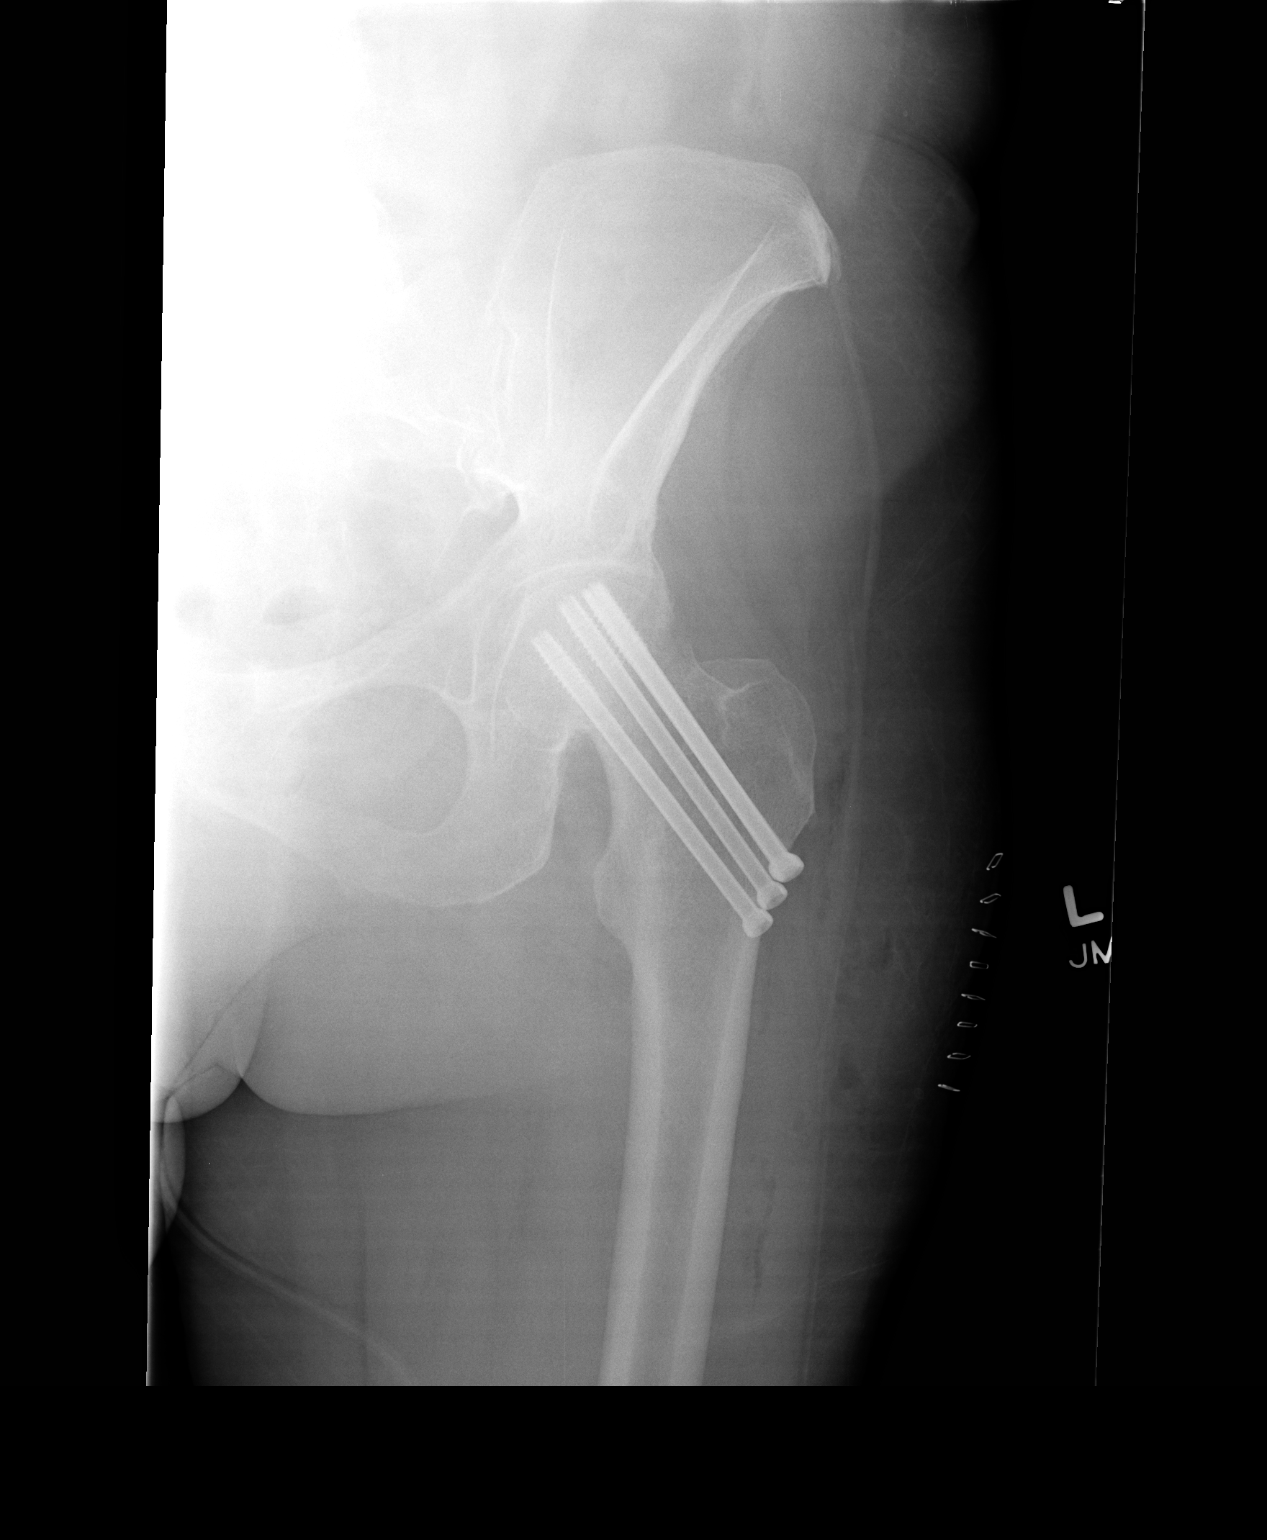

[view not recorded (2 of 2)]
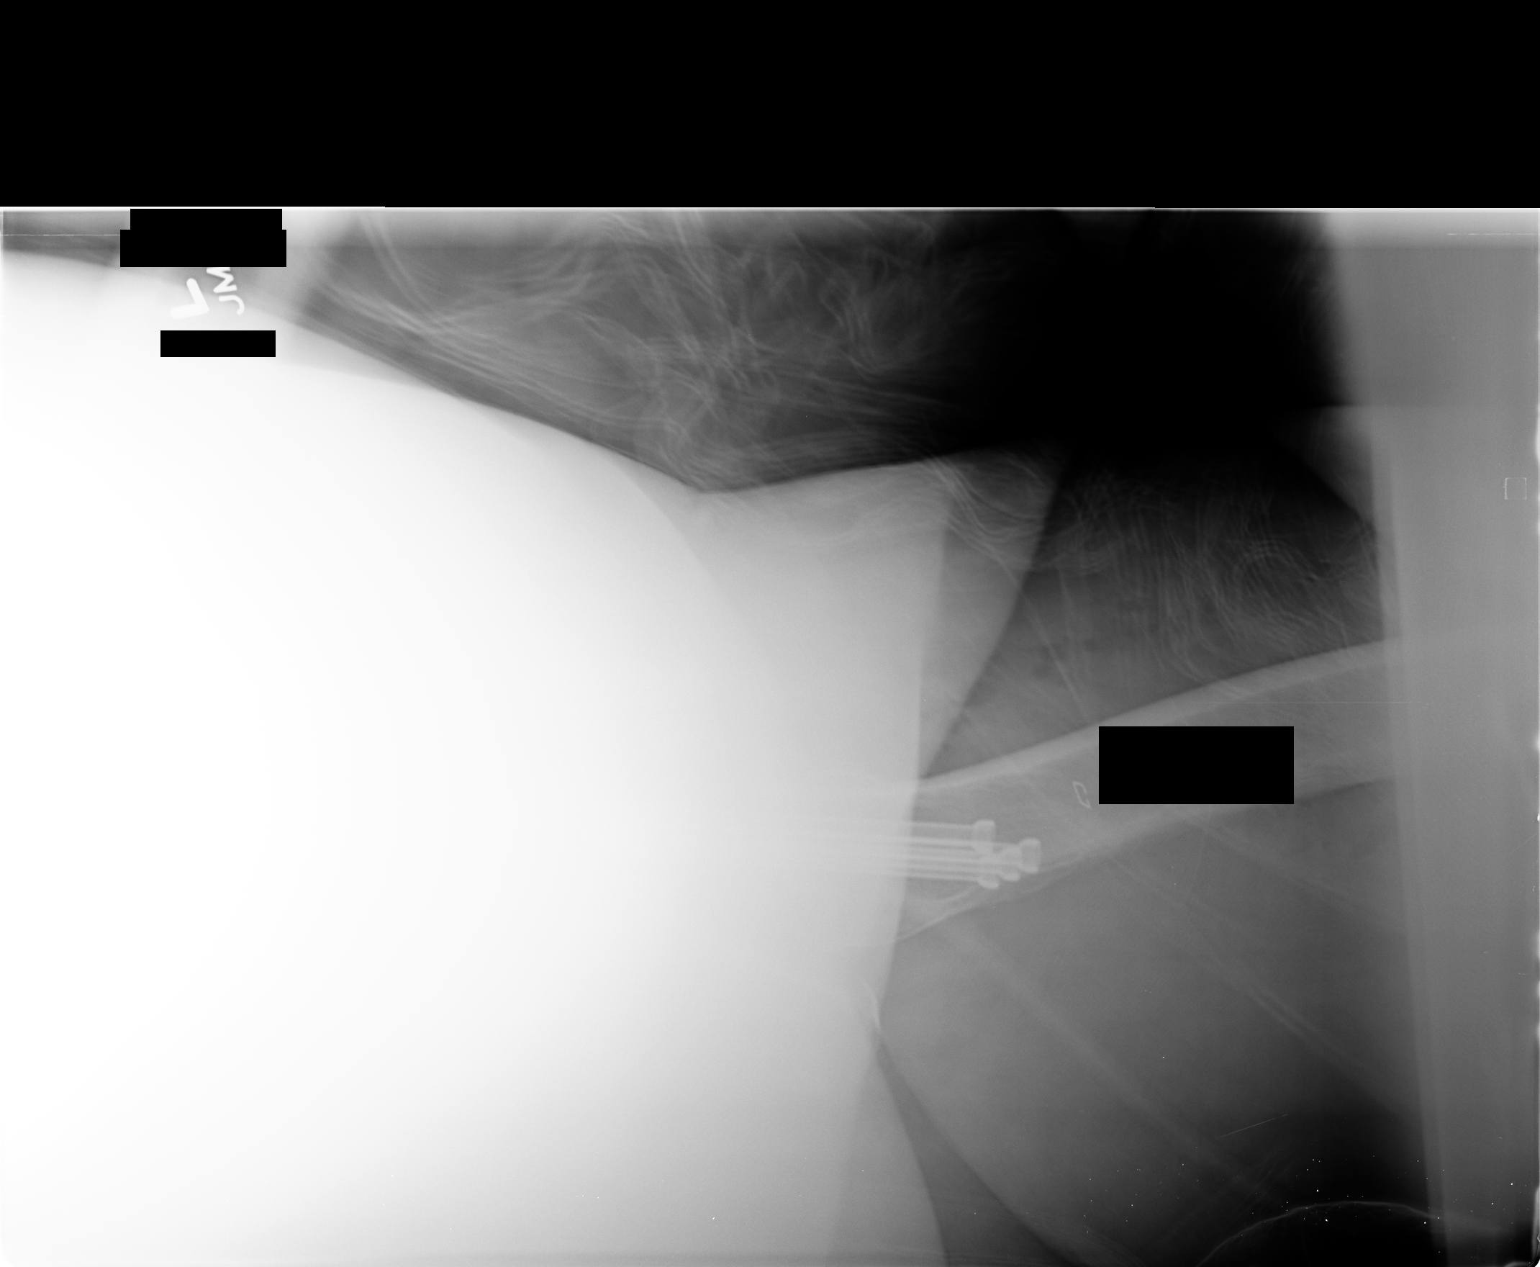

[3 of 3 positions shown; findings below may reference images not displayed]

FINDINGS: Three screws are again seen transfixing the the patient's
left femoral neck in essentially anatomic alignment.  The left
femoral head remains seated at the acetabulum.  No new fractures
are identified.  Overlying soft tissue air and skin staples are
seen.

The left sacroiliac joint is unremarkable in appearance.  Spacers
are noted at the lower lumbar spine.
IMPRESSION: Screws transfixing the left femoral neck appear intact; proximal
left femur noted in essentially anatomic alignment.

## 2010-01-31 IMAGING — CR DG PELVIS 1-2V
1 series · 1 of 1 positions shown · non-contrast
Comparison: [DATE].

CLINICAL DATA: Left hip fracture.  Postoperative radiographs.

PELVIS - 1-2 VIEW

[view not recorded]
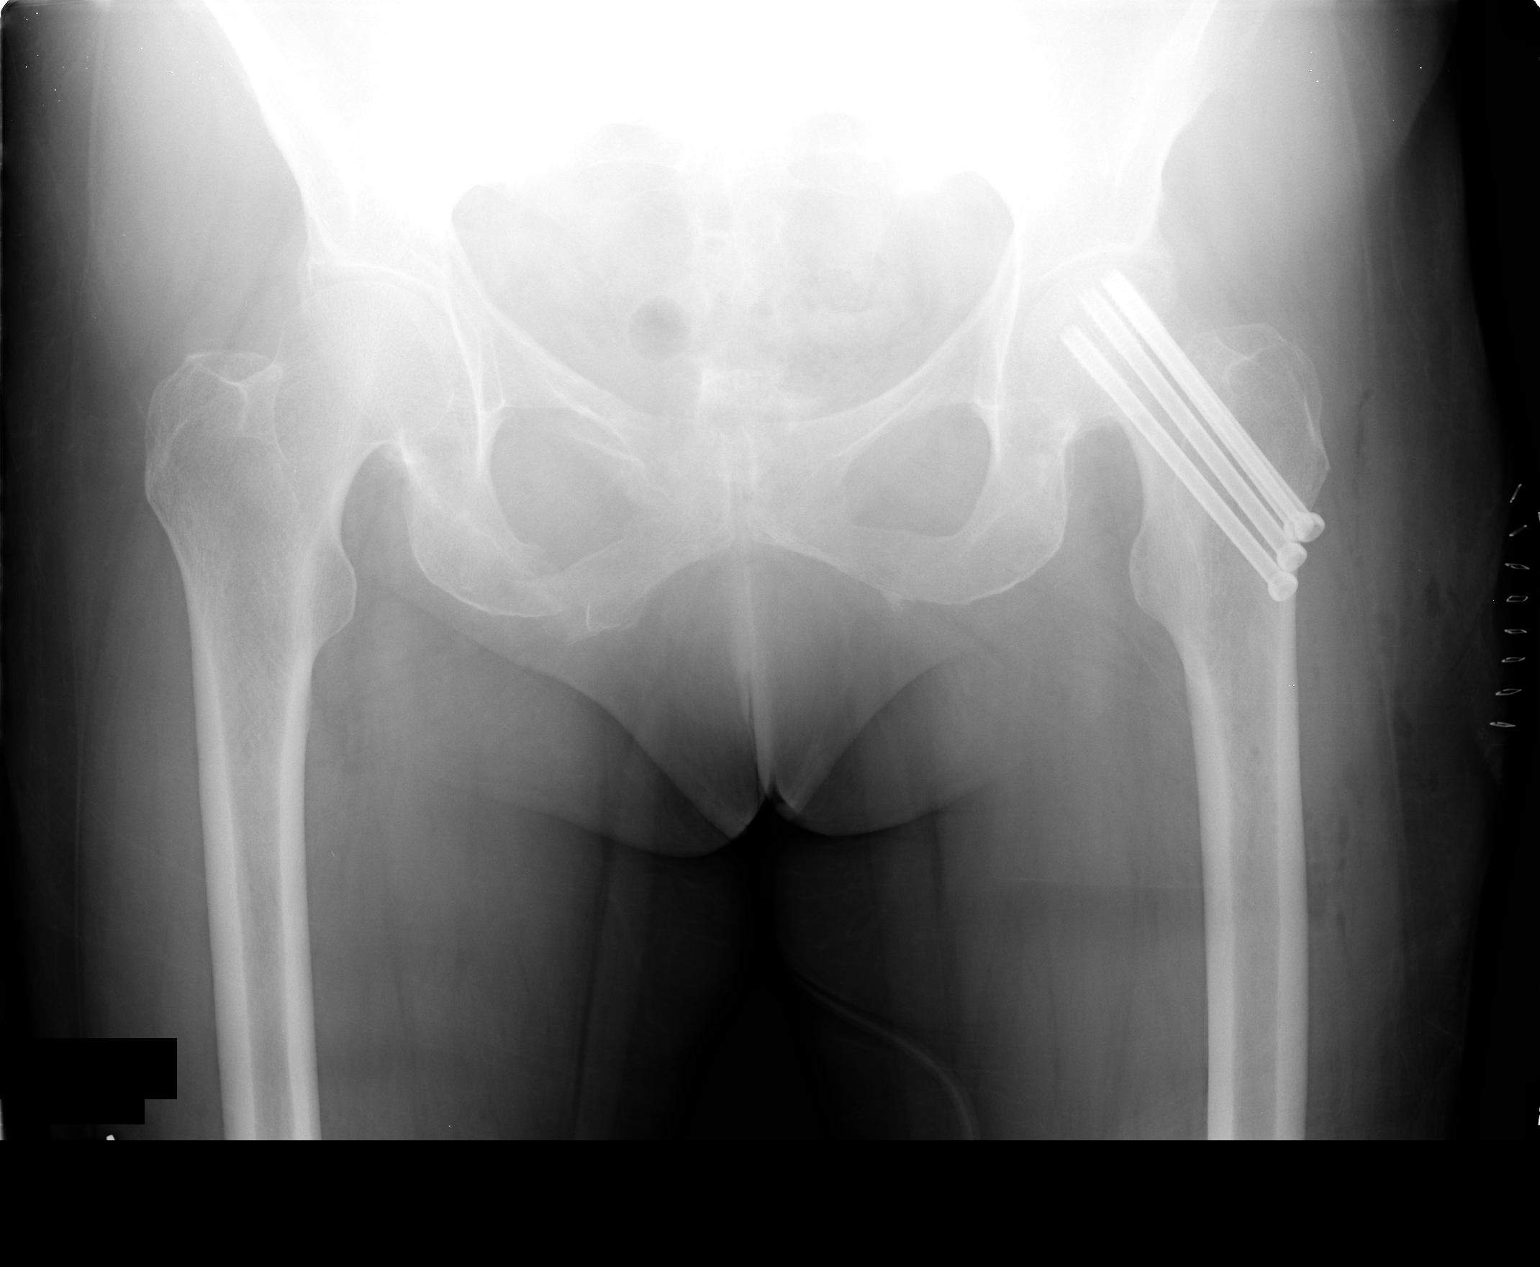

[1 of 1 positions shown; findings below may reference images not displayed]

FINDINGS: The patient is status post ORIF of proximal left femur
fracture.  Expected postsurgical changes in the soft tissues.  No
complication.  Pubic symphysial degenerative disease.  Alignment
near anatomic. Right obturator ring fractures noted.
IMPRESSION: ORIF left hip with four cannulated lag screws.

## 2010-01-31 IMAGING — CT CT CERVICAL SPINE W/O CM
4 of 6 series · 10 of 27 positions shown, 11 images · non-contrast
Comparison: [DATE] head CT

CT HEAD

CLINICAL DATA: Fall from horse, headache, trauma

CT HEAD WITHOUT CONTRAST
CT CERVICAL SPINE WITHOUT CONTRAST
TECHNIQUE: Multidetector CT imaging of the head and cervical spine
was performed following the standard protocol without intravenous
contrast.  Multiplanar CT image reconstructions of the cervical
spine were also generated.

[Series 3: recon 2: brain · axial · 0.47mm/px · z∈[+140,+178]mm · 2 of 72 slices shown]
[im 24/72  bone]
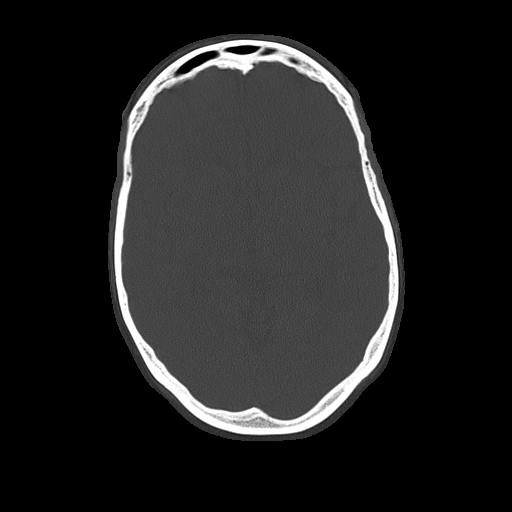
[im 48/72  bone]
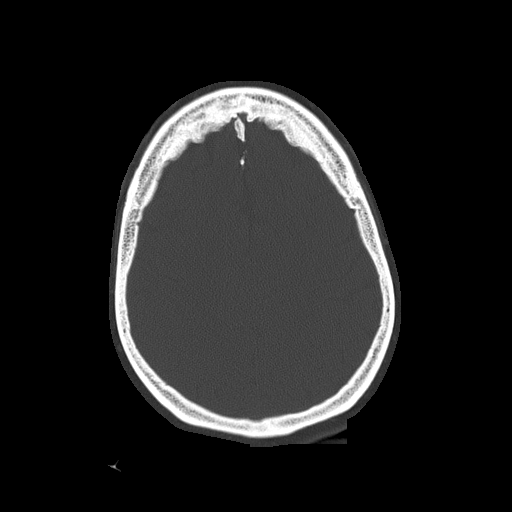

[Series 4: cervical spine · axial · 0.29mm/px · z∈[-60,-3]mm · 2 of 70 slices shown, 3 images]
[im 24/70  soft-tissue]
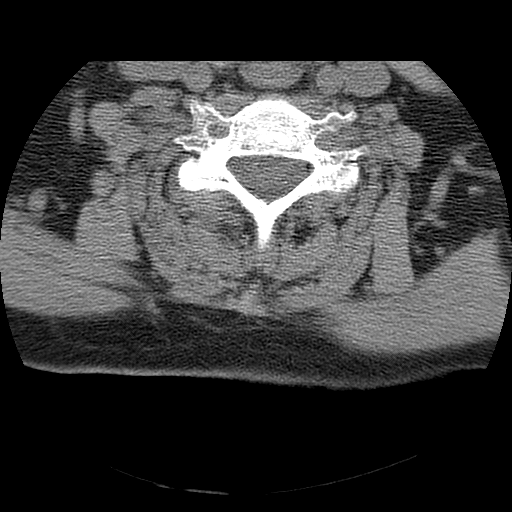
[im 24/70  bone]
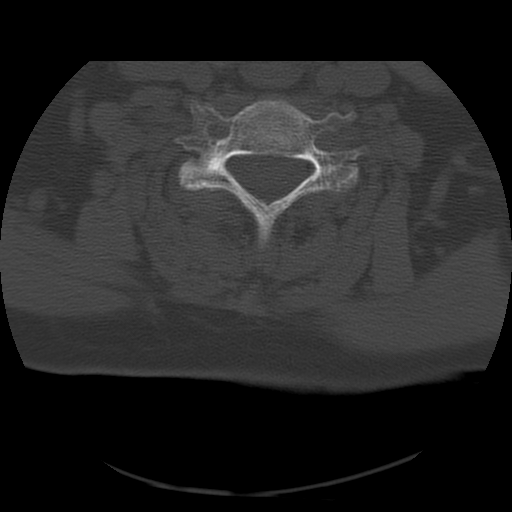
[im 47/70  bone]
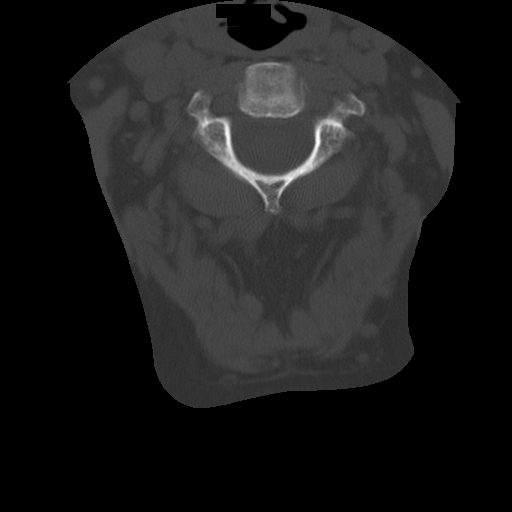

[Series 5: recon 2: cervical spine · axial · 0.29mm/px · z∈[-60,-3]mm · 2 of 70 slices shown]
[im 24/70  bone]
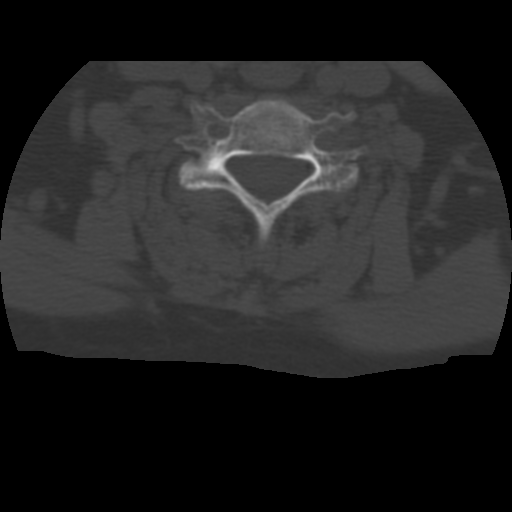
[im 47/70  bone]
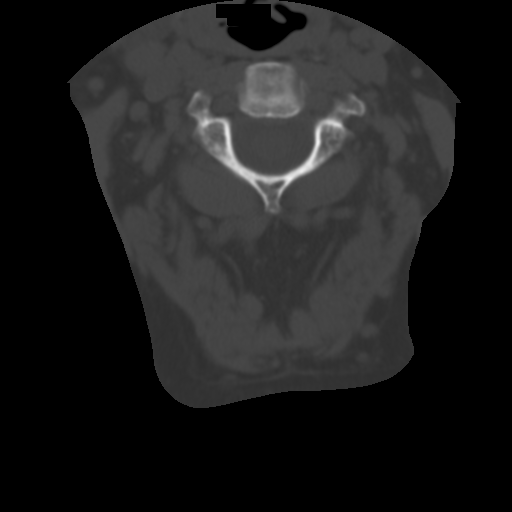

[Series 600: sag · sagittal · 0.32mm/px · 4 of 37 slices shown]
[im 8/37  bone]
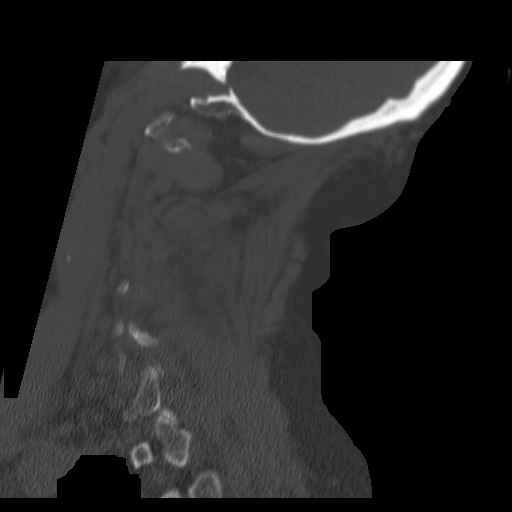
[im 15/37  bone]
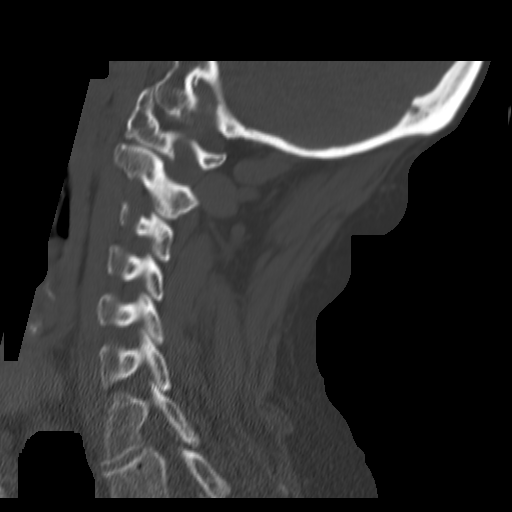
[im 22/37  bone]
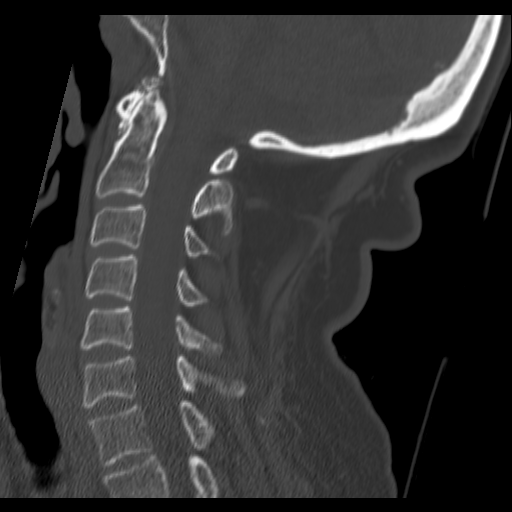
[im 29/37  bone]
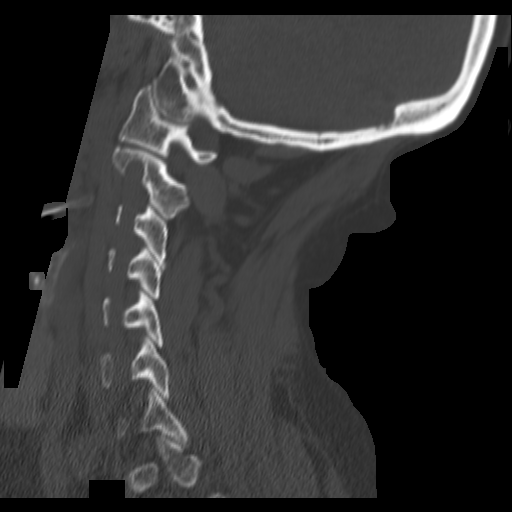

[10 of 27 positions shown; findings below may reference images not displayed]

FINDINGS: Diffuse brain atrophy noted of the cerebrum and the
cerebellum.  Patchy microvascular ischemic changes in the
subcortical and periventricular white matter.  Cisterns patent.
Left orbital region soft tissue swelling noted.  No visualized
skull fracture.  Exam is limited with some motion artifact.
Minimal ethmoid and maxillary mucosal thickening.  Mastoids clear.
IMPRESSION: Stable atrophy and microvascular ischemic changes.  No acute
intracranial finding

CT CERVICAL SPINE
FINDINGS: Normal cervical spine alignment.  No compression
fracture, wedge shaped deformity or focal kyphosis.  Facets
aligned.  Intact odontoid.  Degenerative changes at the C1-2
articulation anteriorly.  Normal prevertebral soft tissues.  No
large epidural hematoma noted.  No soft tissue asymmetry in the
neck.
IMPRESSION: No acute fracture of the cervical spine.

## 2010-01-31 IMAGING — CR DG PORTABLE PELVIS
2 series · 2 of 2 positions shown · non-contrast
Comparison: None

CLINICAL DATA: Fell off a horse.  Right hip pain.

PORTABLE PELVIS

[view not recorded (1 of 2)]
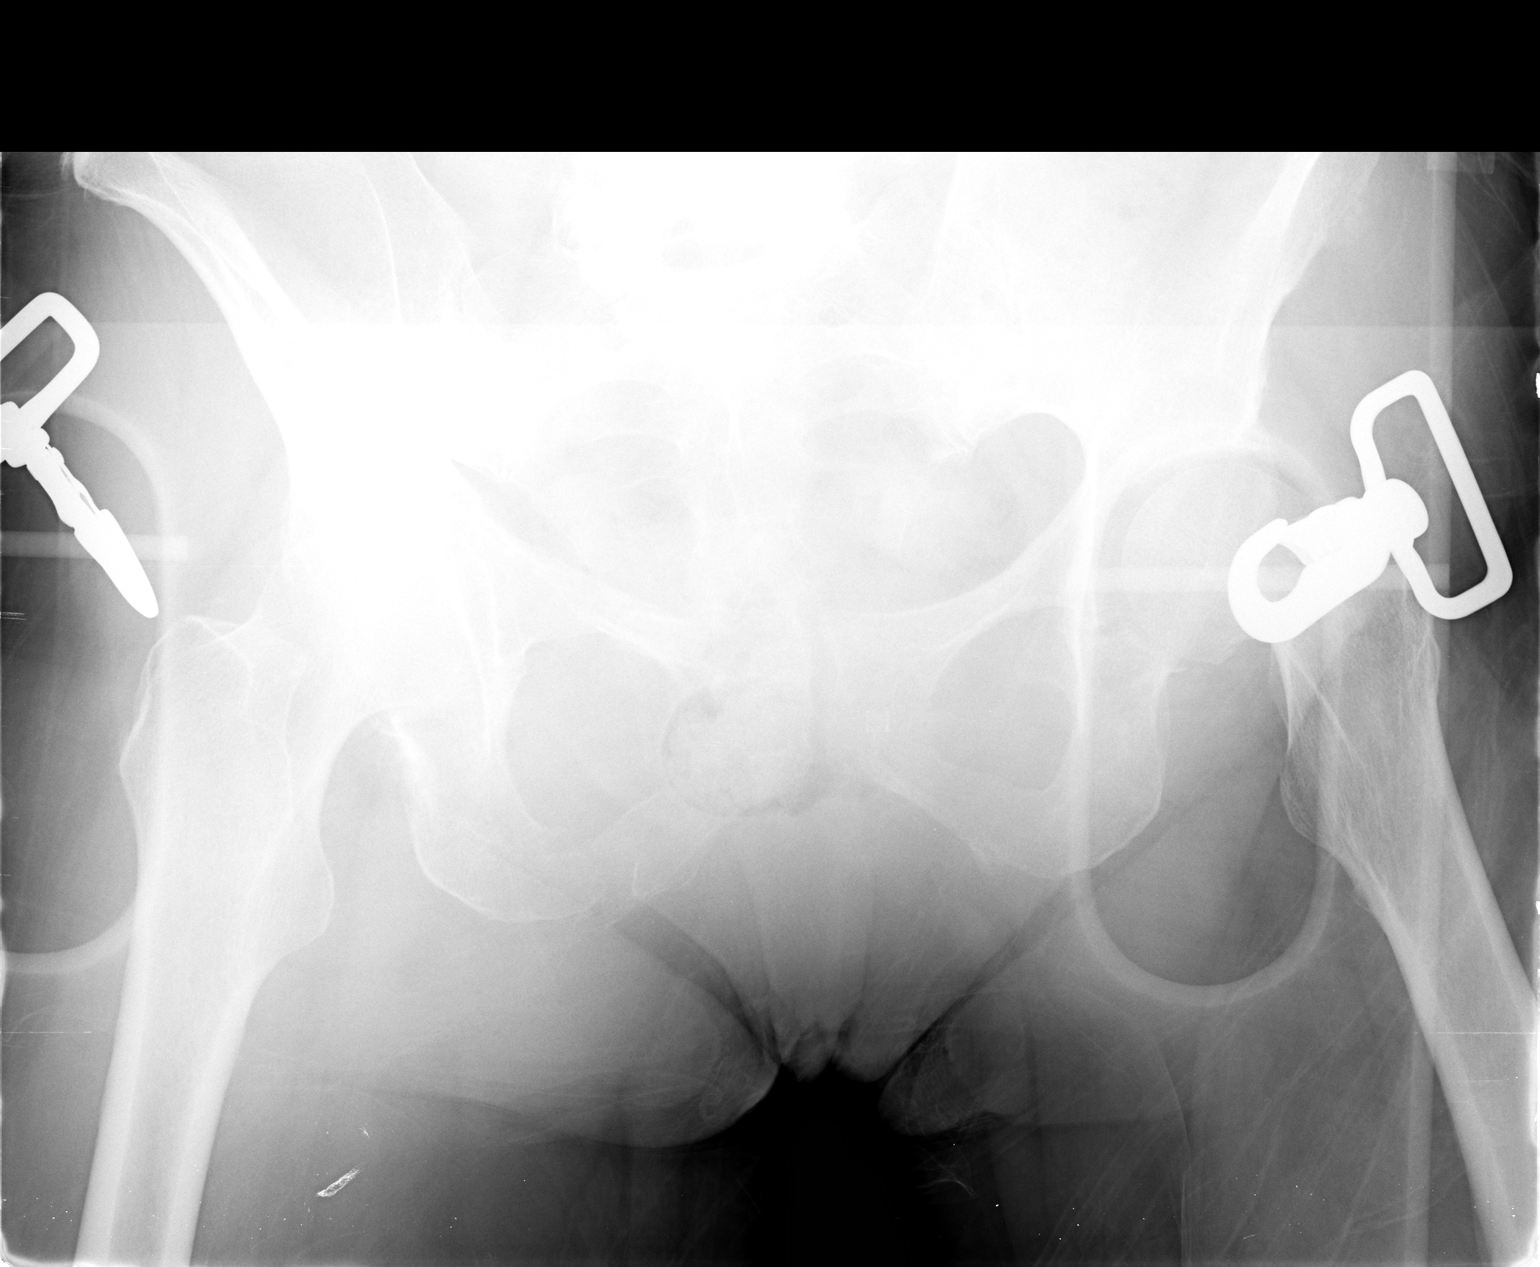

[view not recorded (2 of 2)]
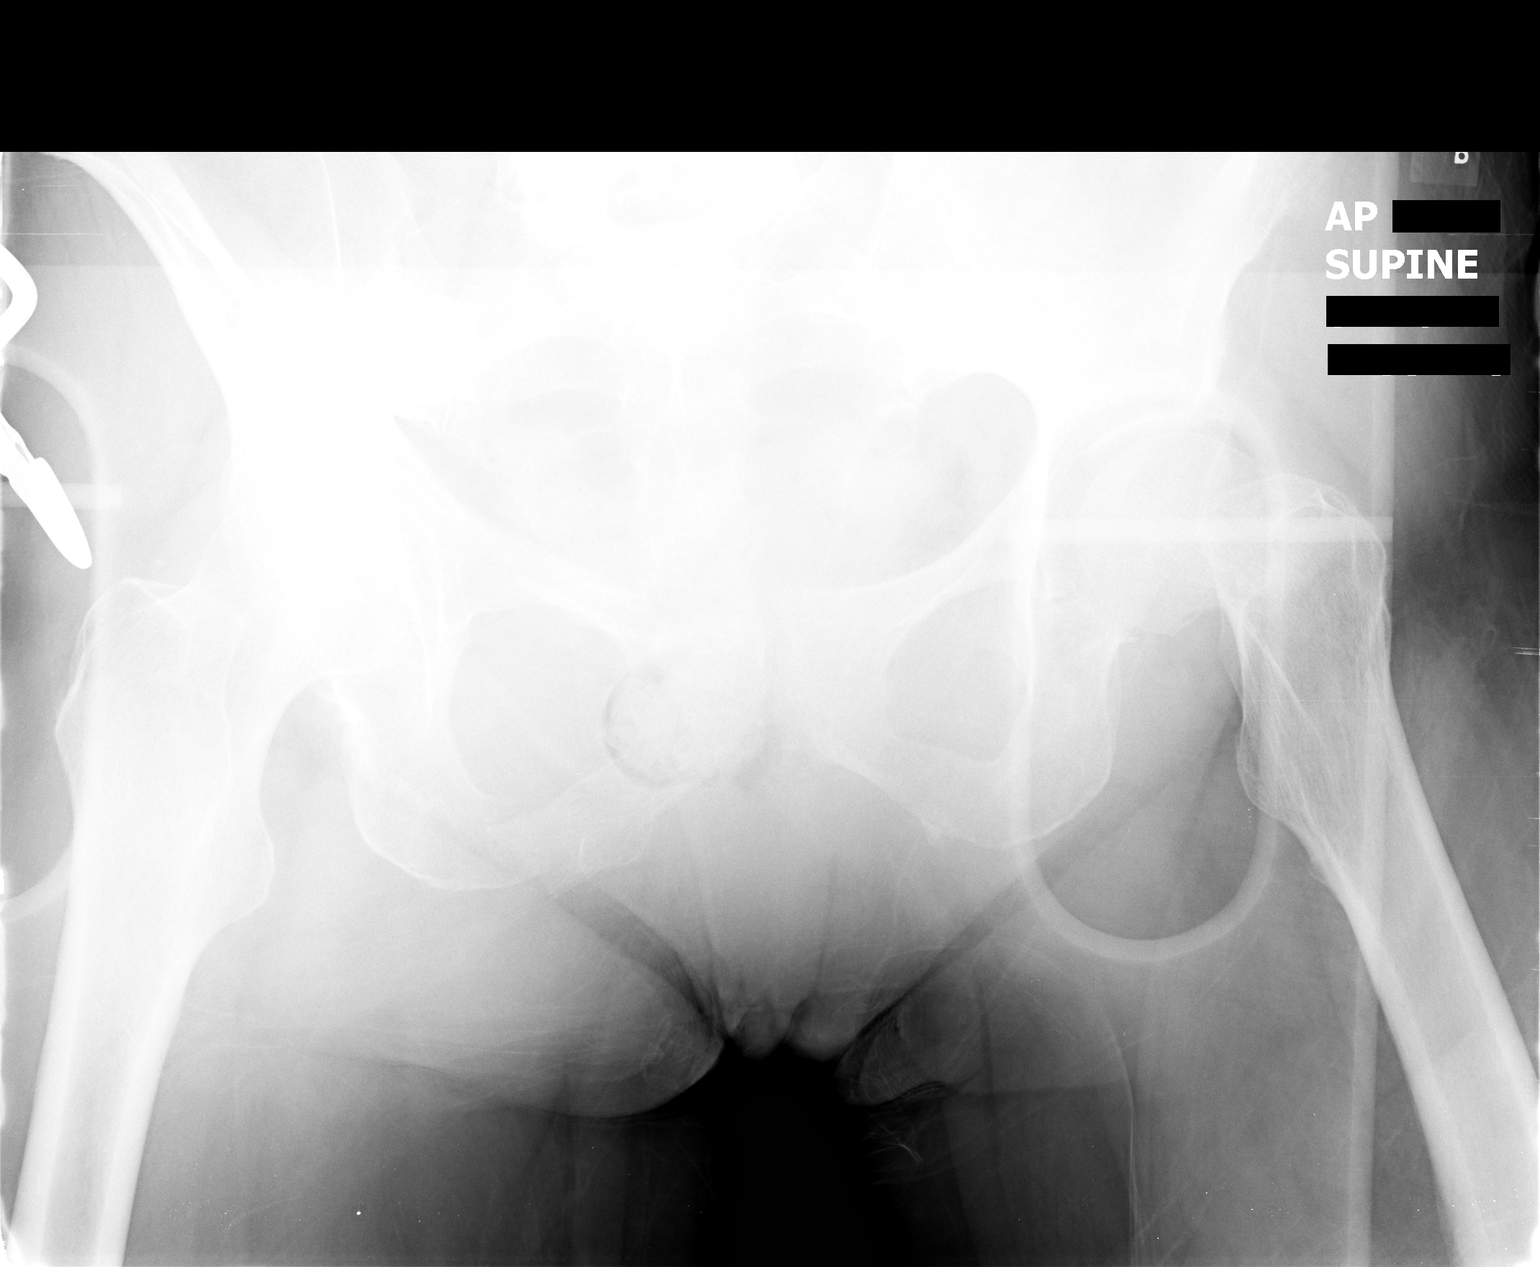

[2 of 2 positions shown; findings below may reference images not displayed]

FINDINGS: Examination is quite limited due to underpenetration and
overlying artifacts.    There are superior and inferior pubic rami
fractures on the right.  The left hip is not well demonstrated but
cannot exclude a femoral neck fracture.  Dedicated films are
suggested.
IMPRESSION: 1.  Superior and inferior pubic rami fractures on the right.
2.  Cannot exclude a left hip fracture.  Recommend dedicated films.

## 2010-02-03 ENCOUNTER — Ambulatory Visit: Payer: Self-pay | Admitting: Physical Medicine & Rehabilitation

## 2010-02-04 ENCOUNTER — Inpatient Hospital Stay (HOSPITAL_COMMUNITY)
Admission: RE | Admit: 2010-02-04 | Discharge: 2010-02-13 | Payer: Self-pay | Admitting: Physical Medicine & Rehabilitation

## 2010-02-08 IMAGING — CR DG HIP 1V*L*
1 series · 1 of 1 positions shown · non-contrast
Comparison: [DATE]

CLINICAL DATA: Postop left hip surgery.

LEFT HIP - 1 VIEW:

[t hip frog leg left]
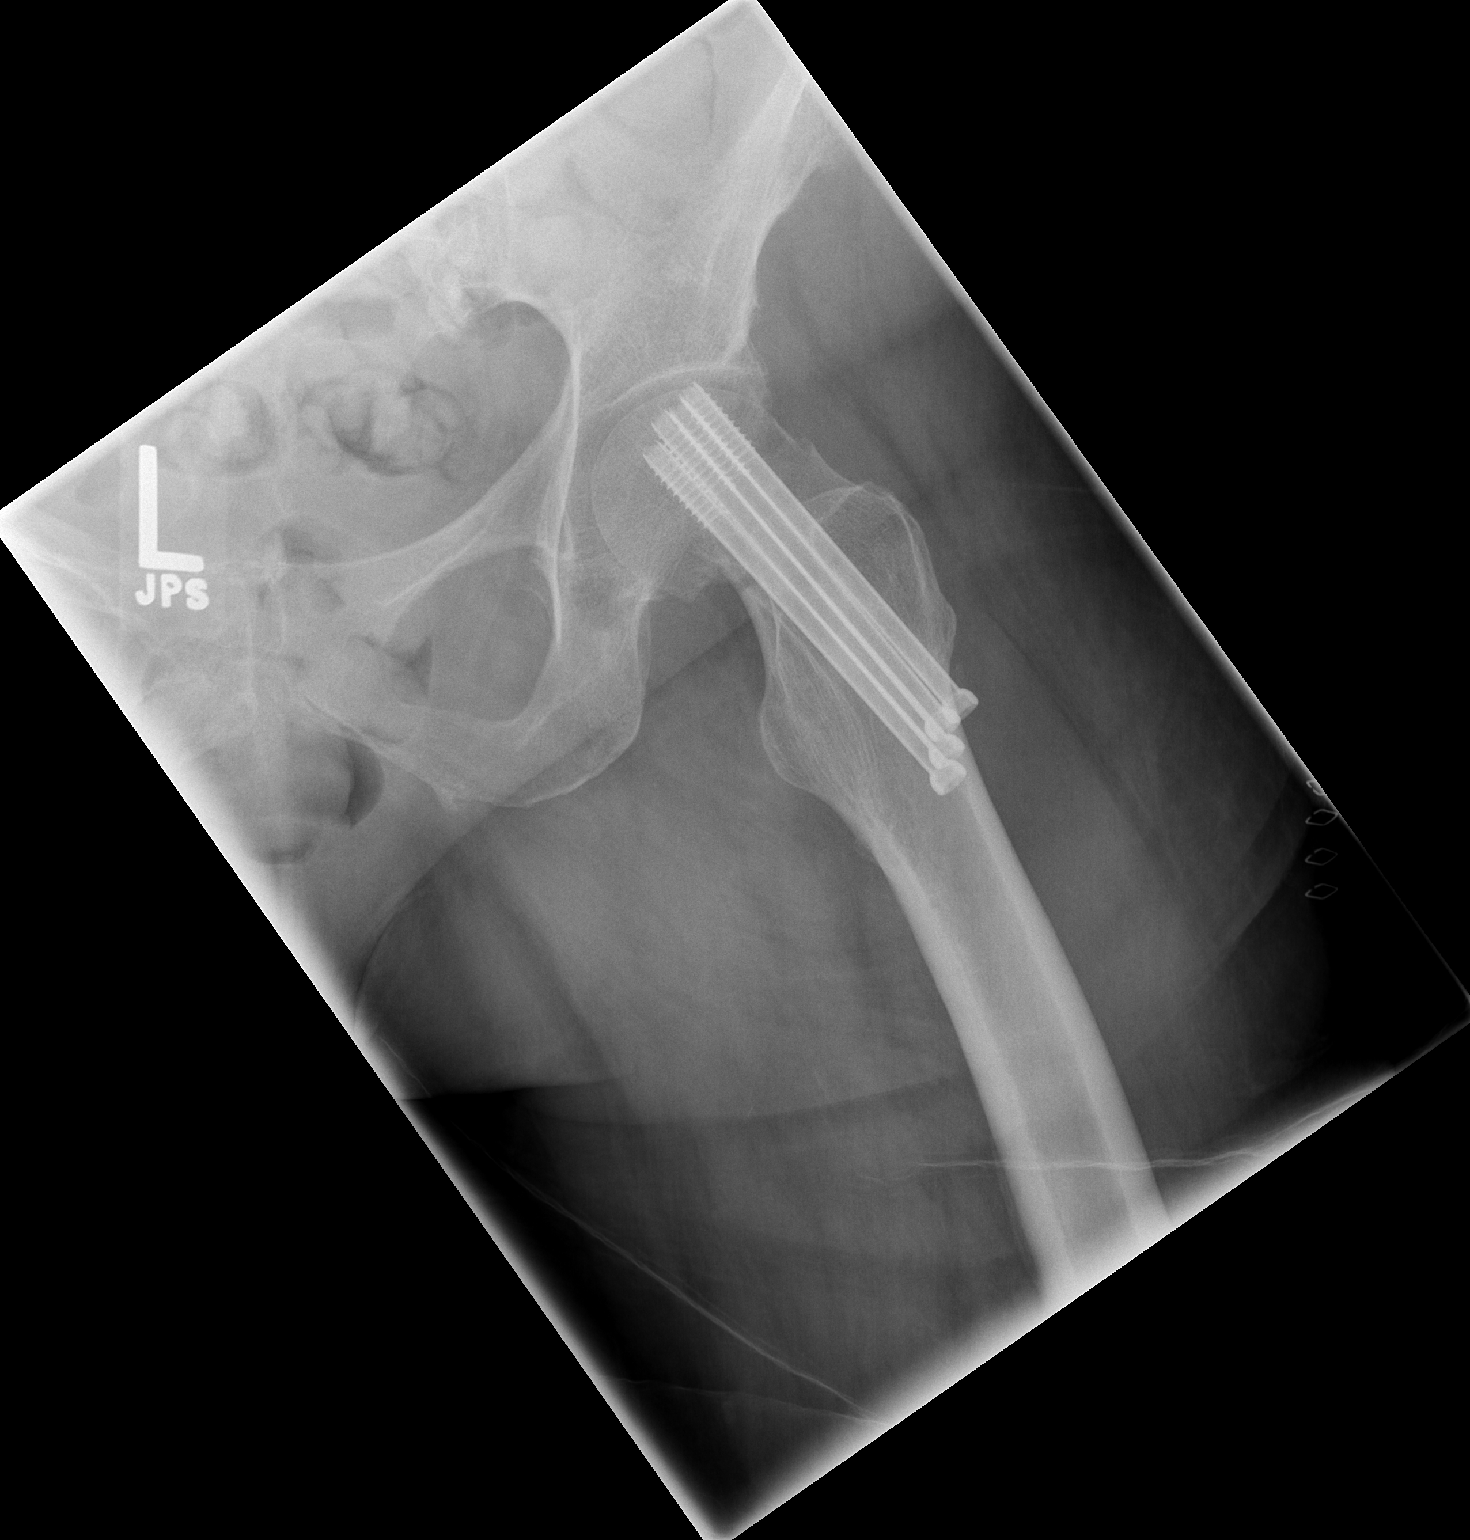

[1 of 1 positions shown; findings below may reference images not displayed]

FINDINGS: Four cannulated lag screws traverse a left femoral neck
fracture, as before.  No definitive callus formation.  Left
obturator ring appears intact.
IMPRESSION: ORIF left femoral neck fracture, without interval change.

## 2010-02-08 IMAGING — CR DG PELVIS 1-2V
1 series · 1 of 1 positions shown · non-contrast
Comparison: [DATE]

CLINICAL DATA: Postop left hip surgery.

PELVIS - 1-2 VIEW

[t pelvis a.p.]
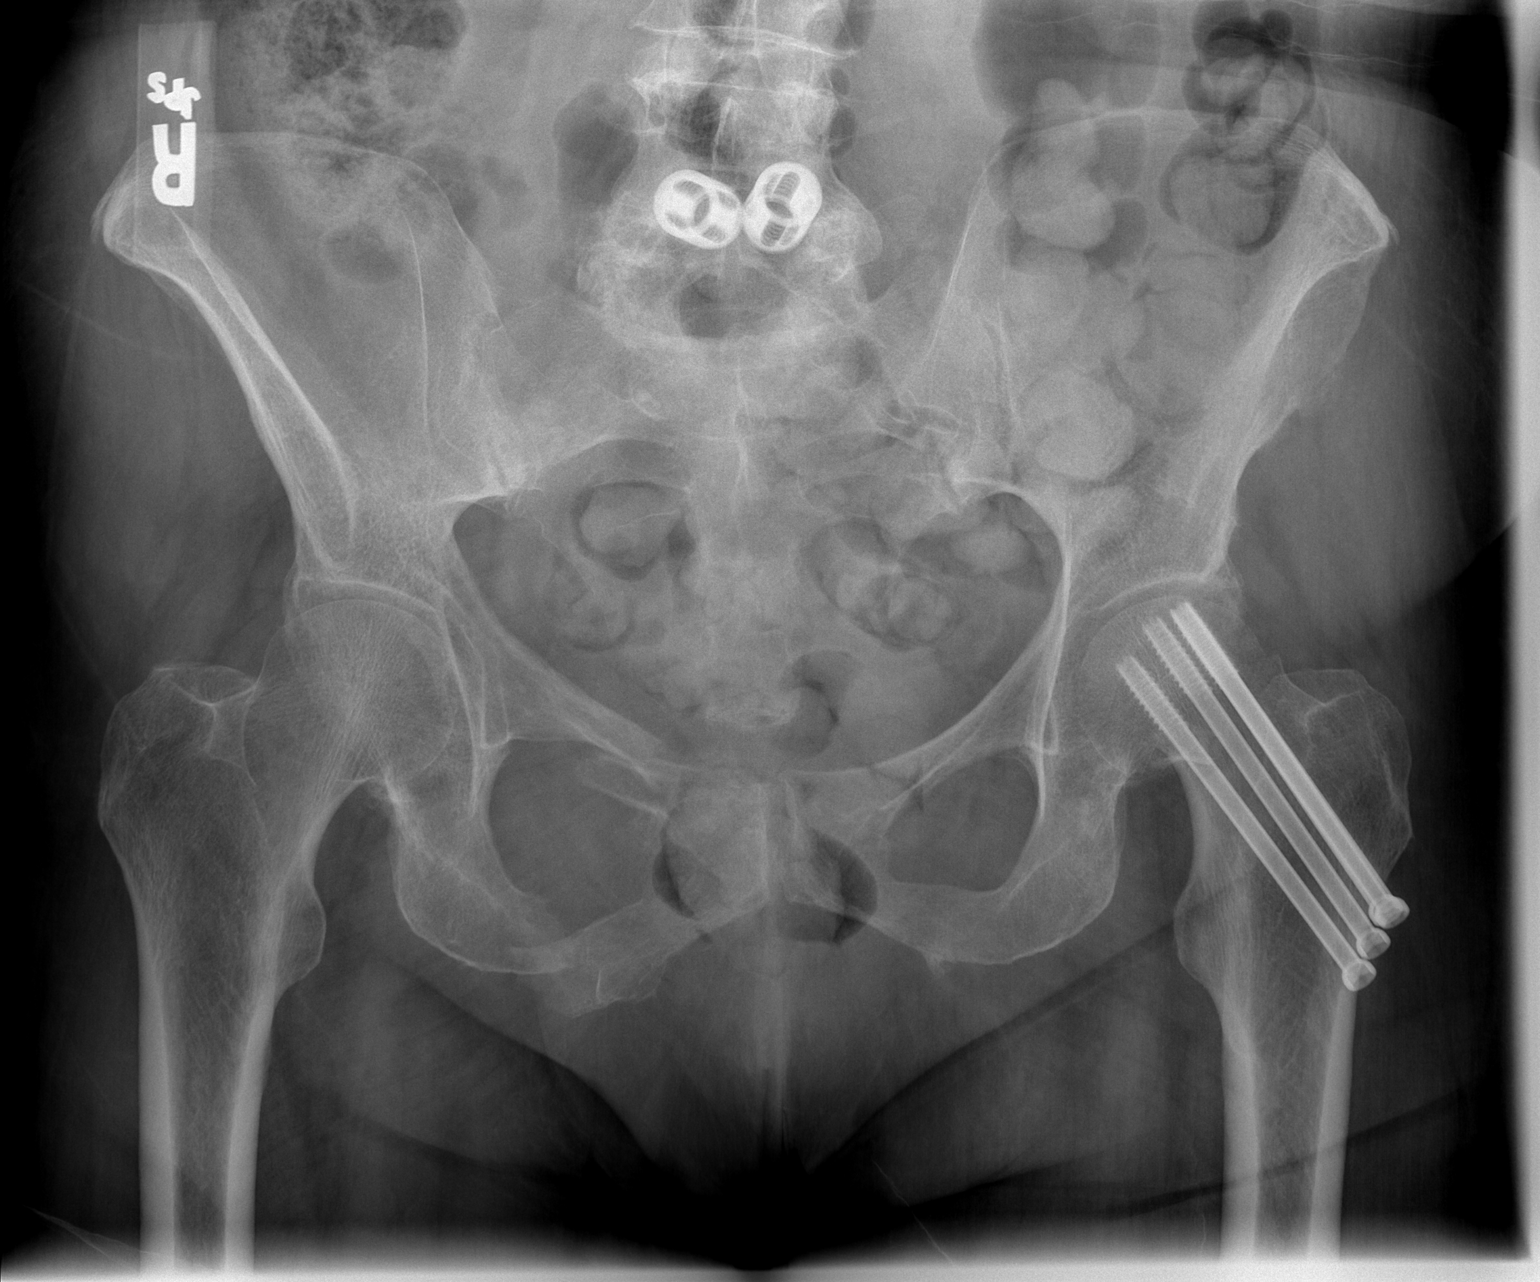

[1 of 1 positions shown; findings below may reference images not displayed]

FINDINGS: Four screws traverse a left femoral neck fracture, as
before.  Displaced fractures of the right superior and inferior
pubic rami are again noted.  Right hip is intact.  Osteopenia.
Interbody cages are seen at L4-5.
IMPRESSION: 1.  ORIF left femur fracture, as before.
2.  Displaced right superior and inferior pubic rami fractures.
3.  Osteopenia.

## 2010-03-01 ENCOUNTER — Emergency Department (HOSPITAL_COMMUNITY): Admission: EM | Admit: 2010-03-01 | Discharge: 2010-03-01 | Payer: Self-pay | Admitting: Emergency Medicine

## 2010-03-02 ENCOUNTER — Ambulatory Visit: Payer: Self-pay | Admitting: Vascular Surgery

## 2010-03-02 ENCOUNTER — Ambulatory Visit (HOSPITAL_COMMUNITY): Admission: RE | Admit: 2010-03-02 | Discharge: 2010-03-02 | Payer: Self-pay | Admitting: Emergency Medicine

## 2010-03-19 ENCOUNTER — Encounter: Admission: RE | Admit: 2010-03-19 | Discharge: 2010-03-19 | Payer: Self-pay | Admitting: Specialist

## 2010-03-19 IMAGING — CT CT EXTREM LOW W/O CM*L*
2 of 4 series · 16 of 46 positions shown, 18 images · non-contrast
Comparison: Plain films left hip [DATE].

CLINICAL DATA: Left hip pain.  The patient status post prior
fixation of left hip fracture.  Status post repair of hip fracture
[DATE].

CT THE LEFT HIP WITHOUT CONTRAST
TECHNIQUE: Multidetector CT imaging of the left hip was performed
according to the standard protocol without intravenous contrast.
Multiplanar CT image reconstructions were also generated.

[Series 2: hip bone · axial · 0.64mm/px · z∈[-229,-4]mm · 13 of 103 slices shown, 15 images]
[im 7/103  soft-tissue]
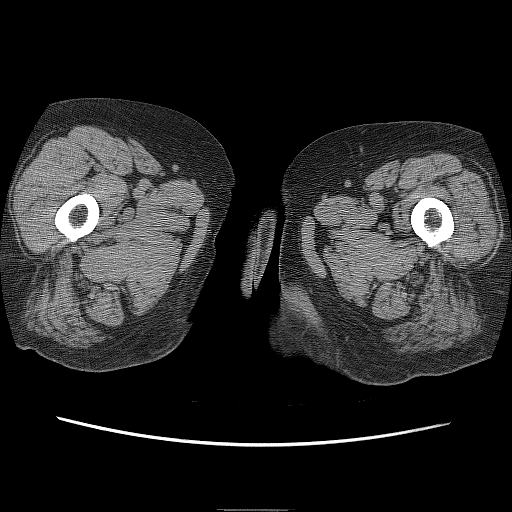
[im 7/103  bone]
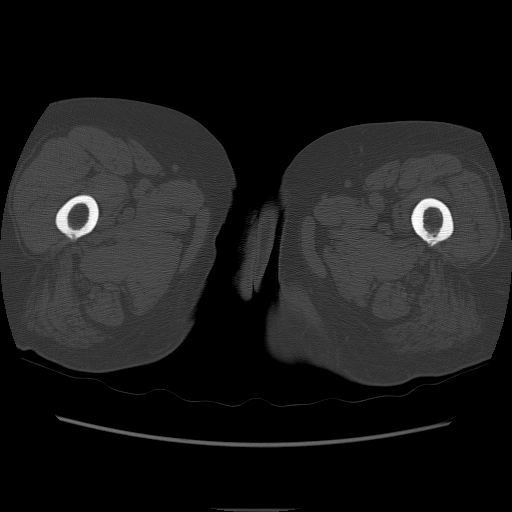
[im 13/103  soft-tissue]
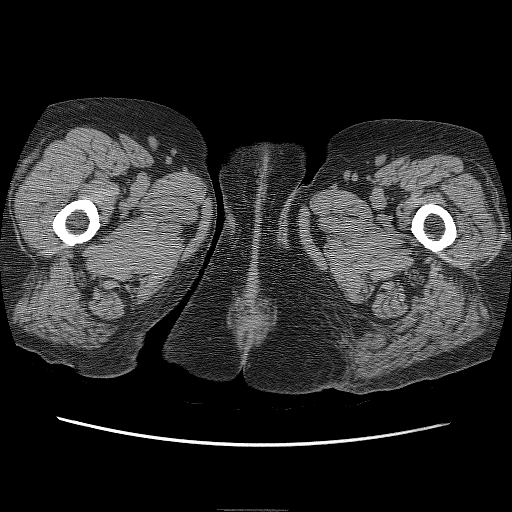
[im 25/103  soft-tissue]
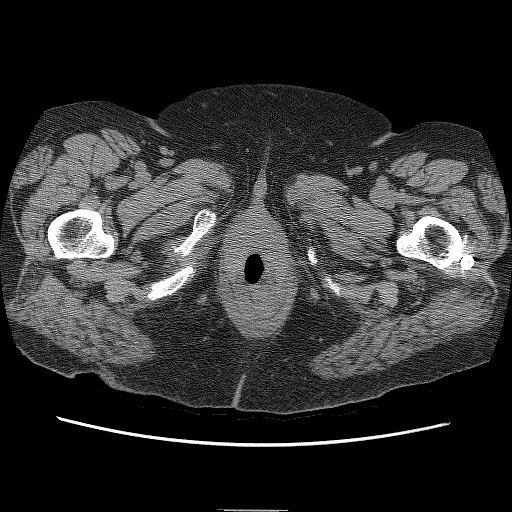
[im 31/103  soft-tissue]
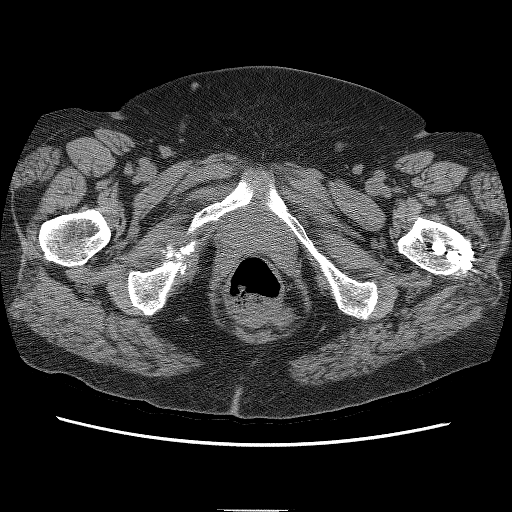
[im 37/103  soft-tissue]
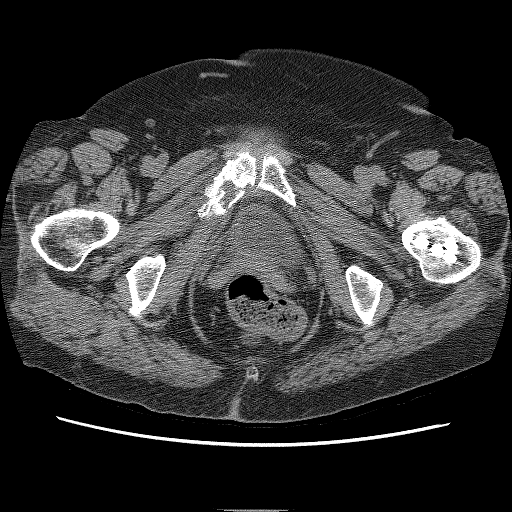
[im 43/103  soft-tissue]
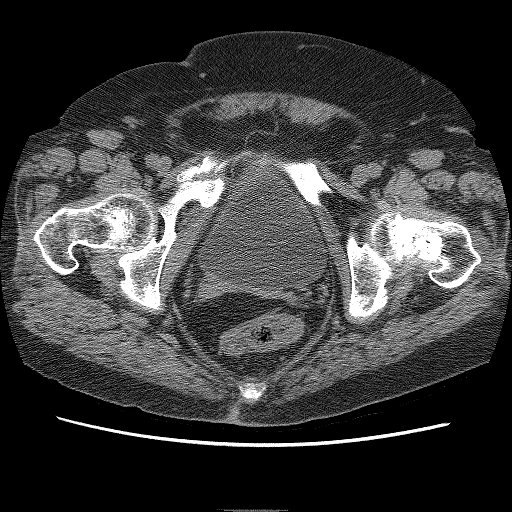
[im 55/103  soft-tissue]
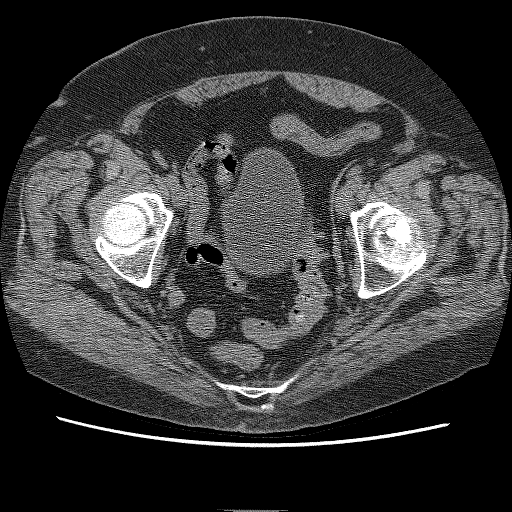
[im 61/103  soft-tissue]
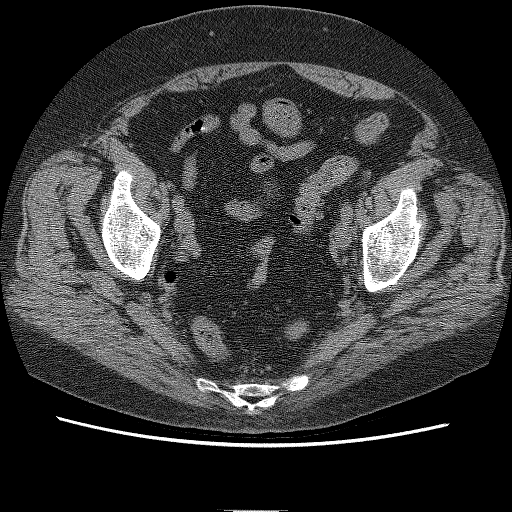
[im 67/103  soft-tissue]
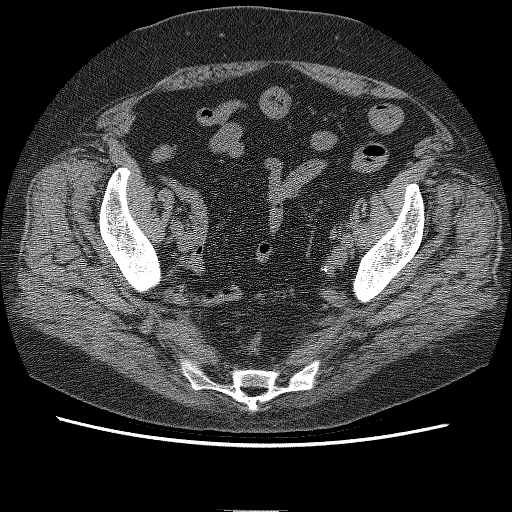
[im 67/103  bone]
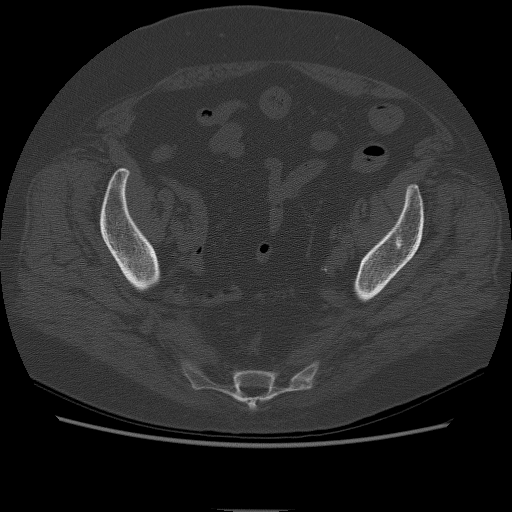
[im 73/103  soft-tissue]
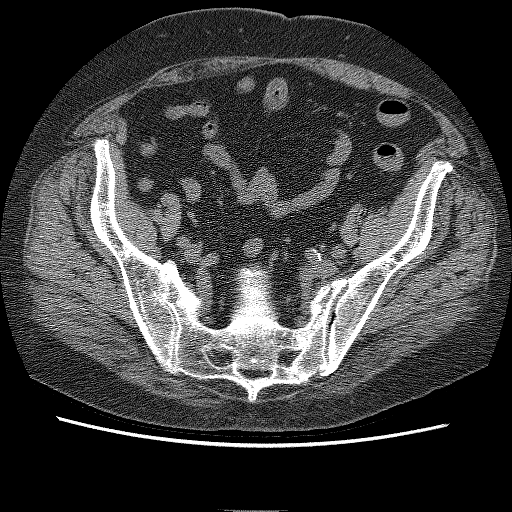
[im 79/103  soft-tissue]
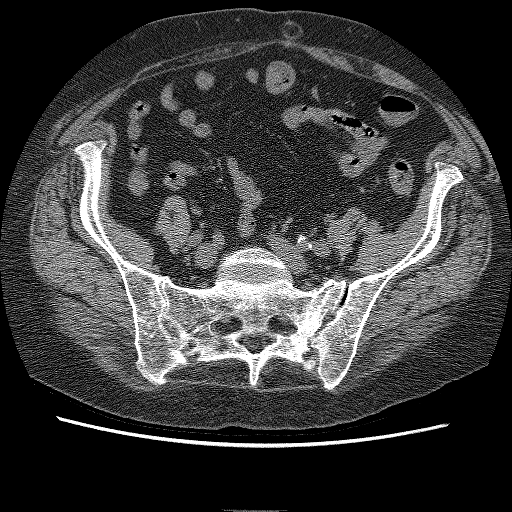
[im 91/103  soft-tissue]
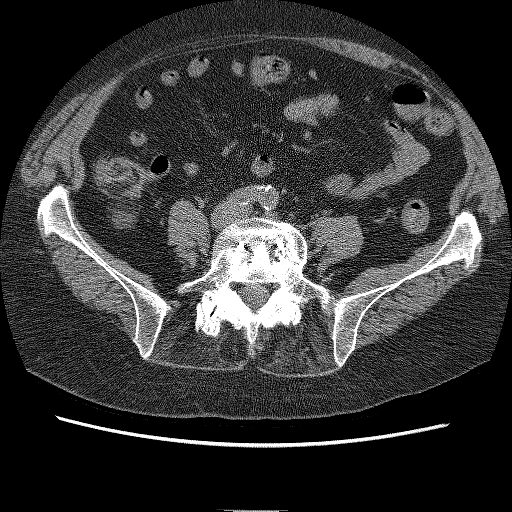
[im 97/103  soft-tissue]
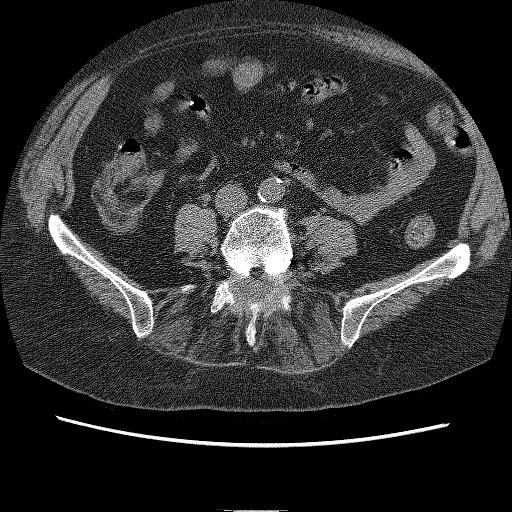

[Series 400: cor · coronal · 0.39mm/px · 3 of 48 slices shown]
[im 16/48  soft-tissue]
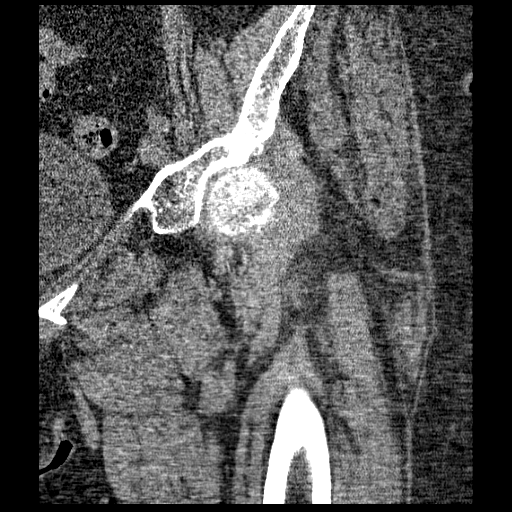
[im 21/48  soft-tissue]
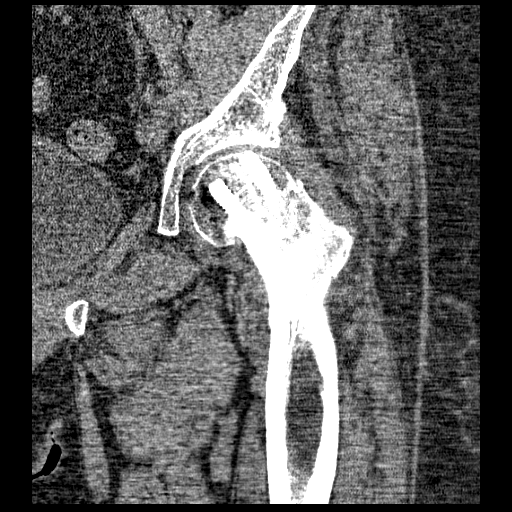
[im 27/48  soft-tissue]
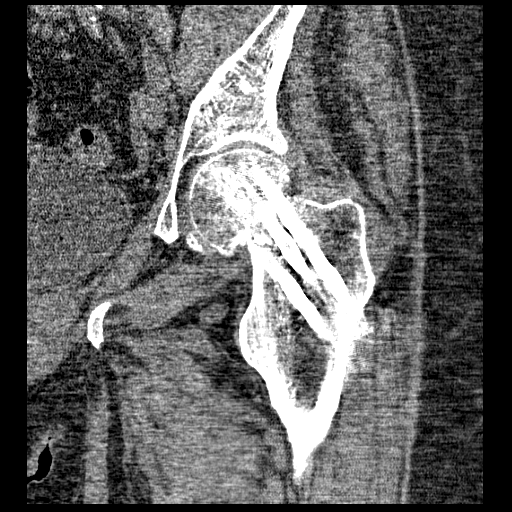

[16 of 46 positions shown; findings below may reference images not displayed]

FINDINGS: Four cannulated screws are seen fixing a subcapital left
femur fracture.  The most anterior and superiorly placed screw
penetrates the cortex of the femoral head protruding 0.3 cm above
the cortex.  Immediately posterior to this, a second screw of abuts
the cortex of the femoral head but the cortex is not disrupted.  On
the comparison plain films, the screws do not appear to exit the
cortex of the femoral head.

The patient's femoral neck fracture remains clearly seen and there
appears to be some increase in inferior displacement of the femoral
with some mild impaction present.  No lucency about the screws is
identified.  No bridging bone is seen across the patient's
fracture. The femoral head is located.  There is no evidence of
avascular necrosis.  No joint effusion is identified.

The patient has a subacute to remote appearing right parasymphyseal
pubic bone fractures with a fracture of the right inferior pubic
bone identified.  There is callus formation about both fractures.
No bridging bone is seen about the right inferior pubic ramus
fracture.  There is a nondisplaced fracture of the left inferior
pubic ramus.  Left sacral ala fracture is also seen with mild
buckling of the cortex and sclerosis present.  No other fracture is
identified.  The patient is status post L5-S1 fusion.
IMPRESSION: 1.  Left femoral neck fracture with four screws in place.  As noted
above, the most anterior and superior screw penetrates the cortex
of the femoral head by 0.3 cm.  The tip of the second screw
immediately posterior to the one described abuts the cortex of the
femoral head without disrupting it.
2.  Left parasymphyseal and inferior pubic rami fractures.  There
is callous about both fractures but no definite bridging bone is
identified.
3.  Nondisplaced fracture the inferior left pubic ramus.
4.  Subacute left sacral ala fracture.

## 2010-04-07 IMAGING — CR DG CHEST 2V
2 series · 2 of 2 positions shown · non-contrast
Comparison: [DATE]

CLINICAL DATA: Preop for left hip surgery

CHEST - 2 VIEW

[view not recorded (1 of 2)]
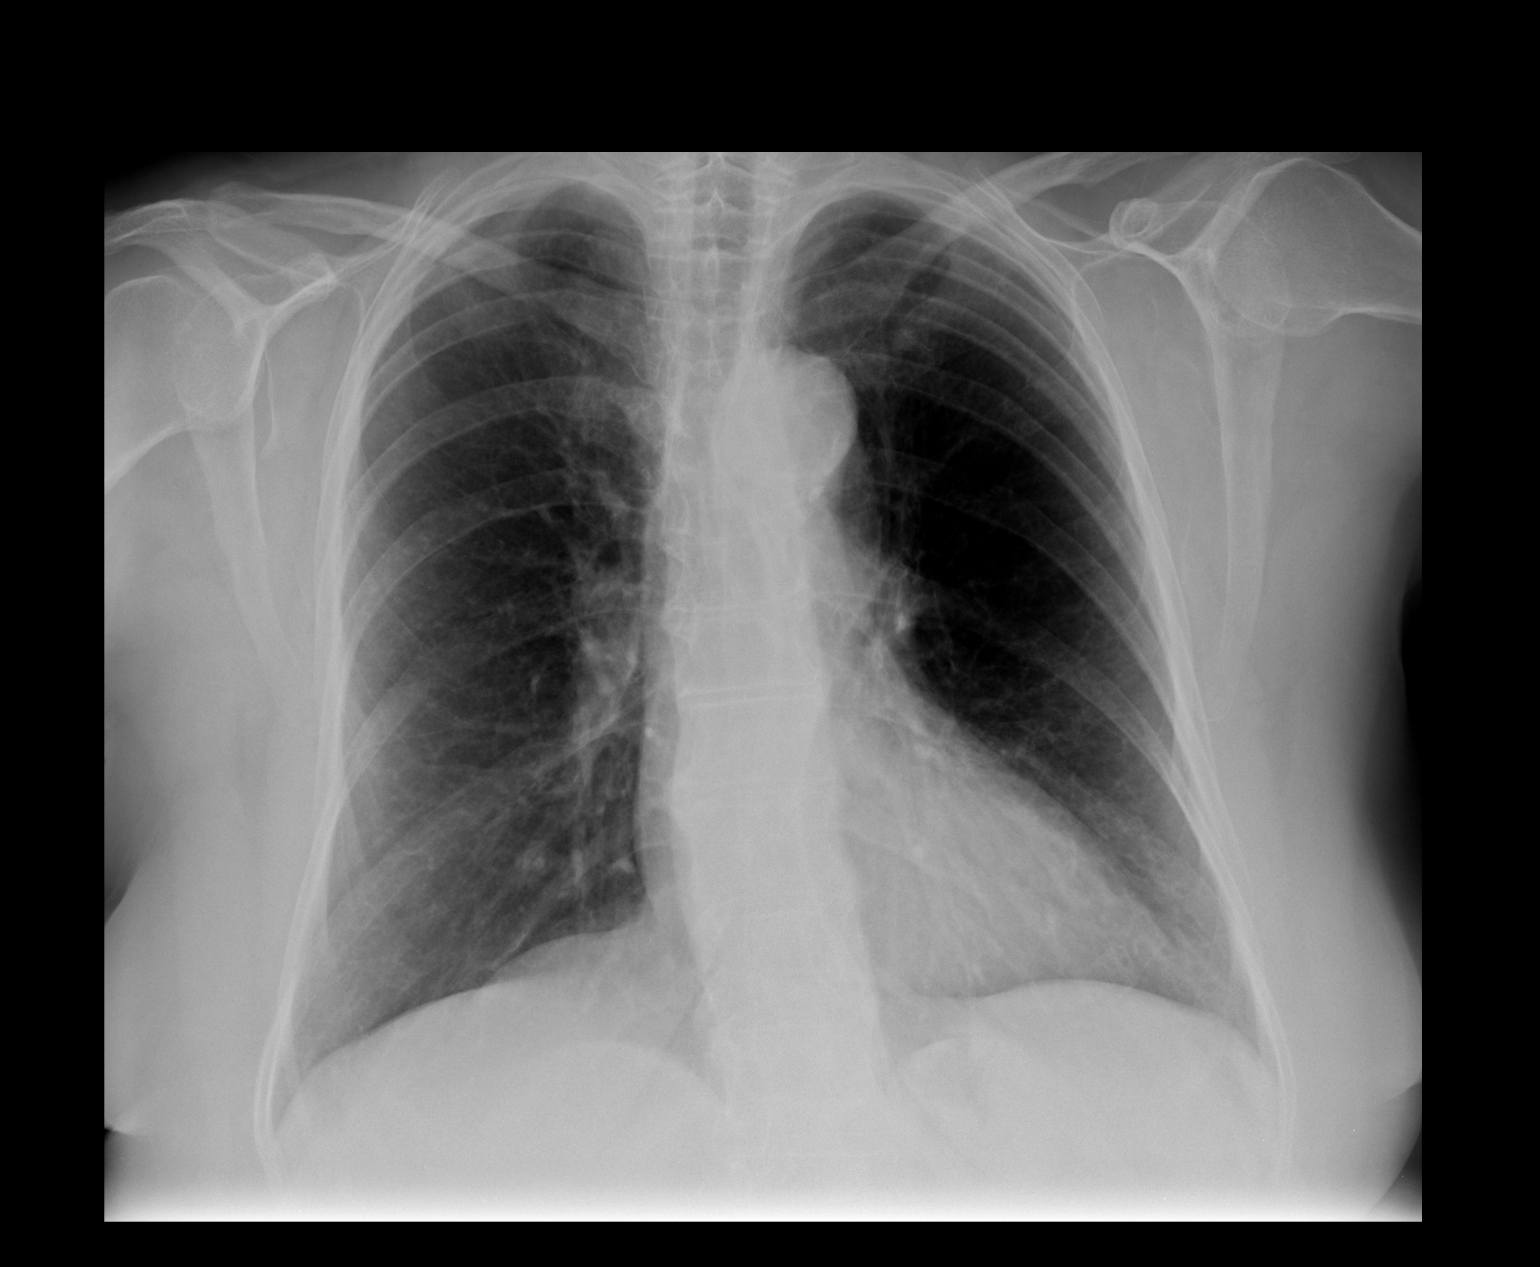

[view not recorded (2 of 2)]
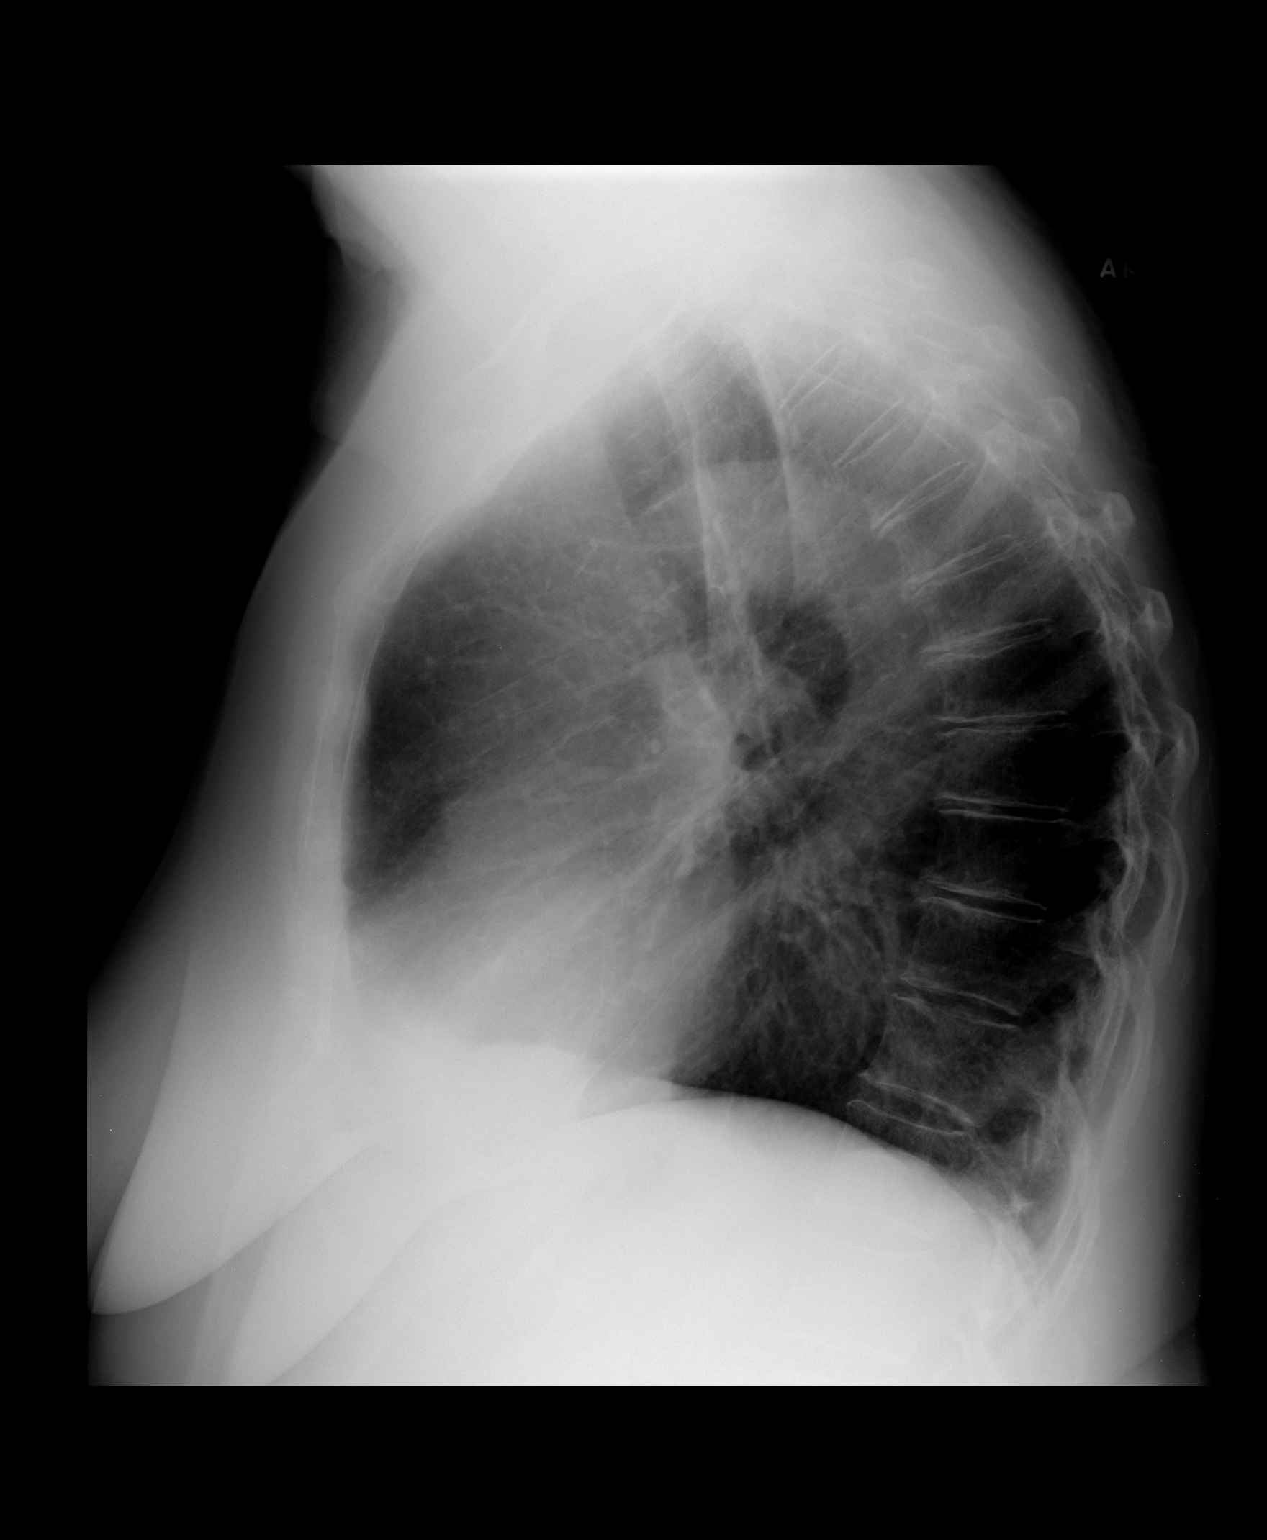

[2 of 2 positions shown; findings below may reference images not displayed]

FINDINGS: Heart and mediastinal contours normal.  There is mild
central airway thickening, apical pleural thickening, and mild
hyperaeration of the lungs.  No acute findings.  No lesions of
bone.
IMPRESSION: Chronic lung changes as above - no active disease.

## 2010-04-08 ENCOUNTER — Ambulatory Visit (HOSPITAL_COMMUNITY): Admission: RE | Admit: 2010-04-08 | Discharge: 2010-04-08 | Payer: Self-pay | Admitting: Specialist

## 2010-04-08 IMAGING — RF DG HIP OPERATIVE*L*
1 series · 2 of 2 positions shown · non-contrast
Comparison: Intraoperative left hip x-rays [DATE].

CLINICAL DATA: Removal of the screws in the left femoral neck.

OPERATIVE LEFT HIP 1 VIEW [DATE]:

[Series 1: run · 2 of 2 slices shown]
[im 1/2]
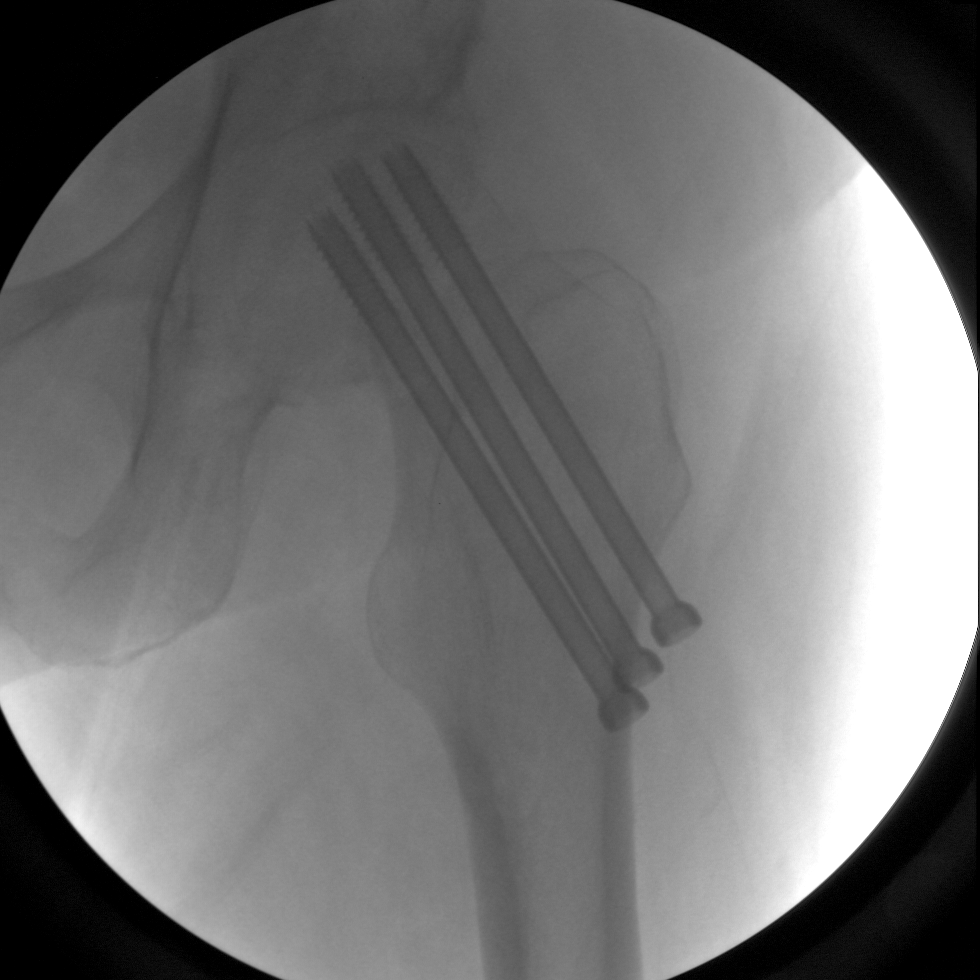
[im 2/2]
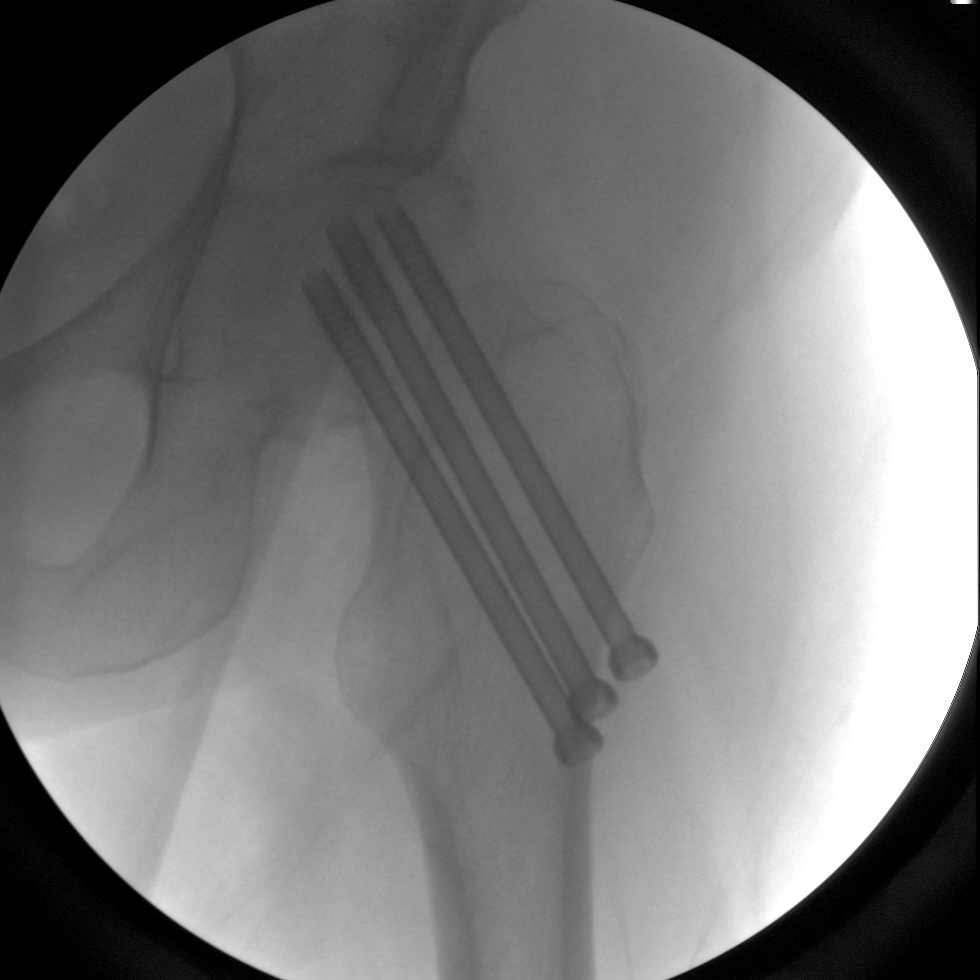

[2 of 2 positions shown; findings below may reference images not displayed]

FINDINGS: 2 spot images from the C-arm fluoroscopic device, AP
images of the left hip, demonstrate removal of one of four screws
placed initially at the time of surgery on [DATE].  Alignment
appears anatomic.  The radiologic technologist documented 51
seconds of fluoroscopy time.
IMPRESSION: Removal of one of the screws placed at the time of initial ORIF of
the left femoral neck fracture.

## 2010-08-15 ENCOUNTER — Encounter: Payer: Self-pay | Admitting: Internal Medicine

## 2010-08-16 ENCOUNTER — Encounter: Payer: Self-pay | Admitting: Internal Medicine

## 2010-10-07 LAB — BASIC METABOLIC PANEL
BUN: 4 mg/dL — ABNORMAL LOW (ref 6–23)
CO2: 30 mEq/L (ref 19–32)
Calcium: 9.3 mg/dL (ref 8.4–10.5)
Chloride: 97 mEq/L (ref 96–112)
Creatinine, Ser: 0.9 mg/dL (ref 0.4–1.2)
GFR calc Af Amer: >60 mL/min (ref >60)
GFR calc non Af Amer: >60 mL/min (ref >60)
Glucose, Bld: 95 mg/dL (ref 70–99)
Potassium: 4.2 mEq/L (ref 3.5–5.1)
Sodium: 135 mEq/L (ref 135–145)

## 2010-10-07 LAB — SURGICAL PCR SCREEN
MRSA, PCR: NEGATIVE
Staphylococcus aureus: NEGATIVE

## 2010-10-07 LAB — CBC
HCT: 38.2 % (ref 36.0–46.0)
Hemoglobin: 13.1 g/dL (ref 12.0–15.0)
MCH: 30.1 pg (ref 26.0–34.0)
MCHC: 34.3 g/dL (ref 30.0–36.0)
MCV: 87.8 fL (ref 78.0–100.0)
Platelets: 260 10*3/uL (ref 150–400)
RBC: 4.35 MIL/uL (ref 3.87–5.11)
RDW: 13.2 % (ref 11.5–15.5)
WBC: 6.2 10*3/uL (ref 4.0–10.5)

## 2010-10-07 LAB — APTT: aPTT: 34 seconds (ref 24–37)

## 2010-10-07 LAB — PROTIME-INR
INR: 1.38 (ref 0.00–1.49)
INR: 1.8 — ABNORMAL HIGH (ref 0.00–1.49)
Prothrombin Time: 17.2 seconds — ABNORMAL HIGH (ref 11.6–15.2)
Prothrombin Time: 21.1 seconds — ABNORMAL HIGH (ref 11.6–15.2)

## 2010-10-08 LAB — POCT I-STAT, CHEM 8
BUN: 3 mg/dL — ABNORMAL LOW (ref 6–23)
Calcium, Ion: 1.08 mmol/L — ABNORMAL LOW (ref 1.12–1.32)
Chloride: 95 mEq/L — ABNORMAL LOW (ref 96–112)
Creatinine, Ser: 0.9 mg/dL (ref 0.4–1.2)
Glucose, Bld: 100 mg/dL — ABNORMAL HIGH (ref 70–99)
HCT: 35 % — ABNORMAL LOW (ref 36.0–46.0)
Hemoglobin: 11.9 g/dL — ABNORMAL LOW (ref 12.0–15.0)
Potassium: 3.7 mEq/L (ref 3.5–5.1)
Sodium: 131 mEq/L — ABNORMAL LOW (ref 135–145)
TCO2: 28 mmol/L (ref 0–100)

## 2010-10-08 LAB — PROTIME-INR
INR: 2.56 — ABNORMAL HIGH (ref 0.00–1.49)
Prothrombin Time: 27.6 seconds — ABNORMAL HIGH (ref 11.6–15.2)

## 2010-10-09 LAB — BASIC METABOLIC PANEL
BUN: 8 mg/dL (ref 6–23)
CO2: 28 mEq/L (ref 19–32)
Calcium: 8.8 mg/dL (ref 8.4–10.5)
Chloride: 97 mEq/L (ref 96–112)
Creatinine, Ser: 0.85 mg/dL (ref 0.4–1.2)
GFR calc Af Amer: >60 mL/min (ref >60)
GFR calc non Af Amer: >60 mL/min (ref >60)
Glucose, Bld: 109 mg/dL — ABNORMAL HIGH (ref 70–99)
Potassium: 4.3 mEq/L (ref 3.5–5.1)
Sodium: 132 mEq/L — ABNORMAL LOW (ref 135–145)

## 2010-10-09 LAB — CBC
HCT: 29.9 % — ABNORMAL LOW (ref 36.0–46.0)
HCT: 30.2 % — ABNORMAL LOW (ref 36.0–46.0)
Hemoglobin: 10.1 g/dL — ABNORMAL LOW (ref 12.0–15.0)
Hemoglobin: 10.5 g/dL — ABNORMAL LOW (ref 12.0–15.0)
MCH: 31.5 pg (ref 26.0–34.0)
MCH: 32.6 pg (ref 26.0–34.0)
MCHC: 33.9 g/dL (ref 30.0–36.0)
MCHC: 34.7 g/dL (ref 30.0–36.0)
MCV: 93.1 fL (ref 78.0–100.0)
MCV: 93.9 fL (ref 78.0–100.0)
Platelets: 250 10*3/uL (ref 150–400)
Platelets: 450 10*3/uL — ABNORMAL HIGH (ref 150–400)
RBC: 3.21 MIL/uL — ABNORMAL LOW (ref 3.87–5.11)
RBC: 3.22 MIL/uL — ABNORMAL LOW (ref 3.87–5.11)
RDW: 12.7 % (ref 11.5–15.5)
RDW: 12.8 % (ref 11.5–15.5)
WBC: 6.9 10*3/uL (ref 4.0–10.5)
WBC: 8.1 10*3/uL (ref 4.0–10.5)

## 2010-10-09 LAB — COMPREHENSIVE METABOLIC PANEL
ALT: 13 U/L (ref 0–35)
AST: 17 U/L (ref 0–37)
Albumin: 2.6 g/dL — ABNORMAL LOW (ref 3.5–5.2)
Alkaline Phosphatase: 46 U/L (ref 39–117)
BUN: 9 mg/dL (ref 6–23)
CO2: 30 mEq/L (ref 19–32)
Calcium: 8.6 mg/dL (ref 8.4–10.5)
Chloride: 97 mEq/L (ref 96–112)
Creatinine, Ser: 0.86 mg/dL (ref 0.4–1.2)
GFR calc Af Amer: >60 mL/min (ref >60)
GFR calc non Af Amer: >60 mL/min (ref >60)
Glucose, Bld: 95 mg/dL (ref 70–99)
Potassium: 4.4 mEq/L (ref 3.5–5.1)
Sodium: 135 mEq/L (ref 135–145)
Total Bilirubin: 0.7 mg/dL (ref 0.3–1.2)
Total Protein: 5.9 g/dL — ABNORMAL LOW (ref 6.0–8.3)

## 2010-10-09 LAB — URINALYSIS, ROUTINE W REFLEX MICROSCOPIC
Bilirubin Urine: NEGATIVE
Glucose, UA: NEGATIVE mg/dL
Hgb urine dipstick: NEGATIVE
Ketones, ur: NEGATIVE mg/dL
Nitrite: NEGATIVE
Protein, ur: NEGATIVE mg/dL
Specific Gravity, Urine: 1.014 (ref 1.005–1.030)
Urobilinogen, UA: 0.2 mg/dL (ref 0.0–1.0)
pH: 6.5 (ref 5.0–8.0)

## 2010-10-09 LAB — URINE CULTURE
Colony Count: NO GROWTH
Culture: NO GROWTH

## 2010-10-09 LAB — DIFFERENTIAL
Basophils Absolute: 0 10*3/uL (ref 0.0–0.1)
Basophils Relative: 0 % (ref 0–1)
Eosinophils Absolute: 0.3 10*3/uL (ref 0.0–0.7)
Eosinophils Relative: 5 % (ref 0–5)
Lymphocytes Relative: 26 % (ref 12–46)
Lymphs Abs: 1.8 10*3/uL (ref 0.7–4.0)
Monocytes Absolute: 0.7 10*3/uL (ref 0.1–1.0)
Monocytes Relative: 10 % (ref 3–12)
Neutro Abs: 4.1 10*3/uL (ref 1.7–7.7)
Neutrophils Relative %: 59 % (ref 43–77)

## 2010-10-09 LAB — PROTIME-INR
INR: 1.39 (ref 0.00–1.49)
INR: 1.74 — ABNORMAL HIGH (ref 0.00–1.49)
INR: 1.89 — ABNORMAL HIGH (ref 0.00–1.49)
INR: 1.92 — ABNORMAL HIGH (ref 0.00–1.49)
INR: 2.35 — ABNORMAL HIGH (ref 0.00–1.49)
INR: 2.49 — ABNORMAL HIGH (ref 0.00–1.49)
INR: 2.81 — ABNORMAL HIGH (ref 0.00–1.49)
INR: 2.89 — ABNORMAL HIGH (ref 0.00–1.49)
INR: 2.97 — ABNORMAL HIGH (ref 0.00–1.49)
Prothrombin Time: 16.9 seconds — ABNORMAL HIGH (ref 11.6–15.2)
Prothrombin Time: 20.2 seconds — ABNORMAL HIGH (ref 11.6–15.2)
Prothrombin Time: 21.5 seconds — ABNORMAL HIGH (ref 11.6–15.2)
Prothrombin Time: 21.8 seconds — ABNORMAL HIGH (ref 11.6–15.2)
Prothrombin Time: 25.5 seconds — ABNORMAL HIGH (ref 11.6–15.2)
Prothrombin Time: 26.7 seconds — ABNORMAL HIGH (ref 11.6–15.2)
Prothrombin Time: 29.4 seconds — ABNORMAL HIGH (ref 11.6–15.2)
Prothrombin Time: 30 seconds — ABNORMAL HIGH (ref 11.6–15.2)
Prothrombin Time: 30.7 seconds — ABNORMAL HIGH (ref 11.6–15.2)

## 2010-10-09 LAB — APTT: aPTT: 37 seconds (ref 24–37)

## 2010-10-10 LAB — CBC
HCT: 28.1 % — ABNORMAL LOW (ref 36.0–46.0)
HCT: 29.5 % — ABNORMAL LOW (ref 36.0–46.0)
HCT: 29.6 % — ABNORMAL LOW (ref 36.0–46.0)
HCT: 35.3 % — ABNORMAL LOW (ref 36.0–46.0)
Hemoglobin: 10.2 g/dL — ABNORMAL LOW (ref 12.0–15.0)
Hemoglobin: 10.3 g/dL — ABNORMAL LOW (ref 12.0–15.0)
Hemoglobin: 12.3 g/dL (ref 12.0–15.0)
Hemoglobin: 9.6 g/dL — ABNORMAL LOW (ref 12.0–15.0)
MCH: 32 pg (ref 26.0–34.0)
MCH: 32.1 pg (ref 26.0–34.0)
MCH: 32.3 pg (ref 26.0–34.0)
MCH: 32.7 pg (ref 26.0–34.0)
MCHC: 34.3 g/dL (ref 30.0–36.0)
MCHC: 34.4 g/dL (ref 30.0–36.0)
MCHC: 34.8 g/dL (ref 30.0–36.0)
MCHC: 34.9 g/dL (ref 30.0–36.0)
MCV: 92.4 fL (ref 78.0–100.0)
MCV: 93.4 fL (ref 78.0–100.0)
MCV: 93.4 fL (ref 78.0–100.0)
MCV: 93.8 fL (ref 78.0–100.0)
Platelets: 169 10*3/uL (ref 150–400)
Platelets: 184 10*3/uL (ref 150–400)
Platelets: 190 10*3/uL (ref 150–400)
Platelets: 262 10*3/uL (ref 150–400)
RBC: 3.01 MIL/uL — ABNORMAL LOW (ref 3.87–5.11)
RBC: 3.17 MIL/uL — ABNORMAL LOW (ref 3.87–5.11)
RBC: 3.19 MIL/uL — ABNORMAL LOW (ref 3.87–5.11)
RBC: 3.77 MIL/uL — ABNORMAL LOW (ref 3.87–5.11)
RDW: 12.2 % (ref 11.5–15.5)
RDW: 12.9 % (ref 11.5–15.5)
RDW: 13 % (ref 11.5–15.5)
RDW: 13 % (ref 11.5–15.5)
WBC: 13.7 10*3/uL — ABNORMAL HIGH (ref 4.0–10.5)
WBC: 6.9 10*3/uL (ref 4.0–10.5)
WBC: 7.7 10*3/uL (ref 4.0–10.5)
WBC: 8.4 10*3/uL (ref 4.0–10.5)

## 2010-10-10 LAB — PROTIME-INR
INR: 0.96 (ref 0.00–1.49)
INR: 0.98 (ref 0.00–1.49)
Prothrombin Time: 12.7 seconds (ref 11.6–15.2)
Prothrombin Time: 12.9 seconds (ref 11.6–15.2)

## 2010-10-10 LAB — BASIC METABOLIC PANEL
BUN: 6 mg/dL (ref 6–23)
CO2: 29 mEq/L (ref 19–32)
Calcium: 8.3 mg/dL — ABNORMAL LOW (ref 8.4–10.5)
Chloride: 101 mEq/L (ref 96–112)
Creatinine, Ser: 0.76 mg/dL (ref 0.4–1.2)
GFR calc Af Amer: >60 mL/min (ref >60)
GFR calc non Af Amer: >60 mL/min (ref >60)
Glucose, Bld: 115 mg/dL — ABNORMAL HIGH (ref 70–99)
Potassium: 3.3 mEq/L — ABNORMAL LOW (ref 3.5–5.1)
Sodium: 136 mEq/L (ref 135–145)

## 2010-10-10 LAB — COMPREHENSIVE METABOLIC PANEL
ALT: 19 U/L (ref 0–35)
AST: 27 U/L (ref 0–37)
Albumin: 3.4 g/dL — ABNORMAL LOW (ref 3.5–5.2)
Alkaline Phosphatase: 45 U/L (ref 39–117)
BUN: 5 mg/dL — ABNORMAL LOW (ref 6–23)
CO2: 26 mEq/L (ref 19–32)
Calcium: 8.5 mg/dL (ref 8.4–10.5)
Chloride: 101 mEq/L (ref 96–112)
Creatinine, Ser: 1.15 mg/dL (ref 0.4–1.2)
GFR calc Af Amer: 57 mL/min — ABNORMAL LOW (ref >60)
GFR calc non Af Amer: 47 mL/min — ABNORMAL LOW (ref >60)
Glucose, Bld: 108 mg/dL — ABNORMAL HIGH (ref 70–99)
Potassium: 3.1 mEq/L — ABNORMAL LOW (ref 3.5–5.1)
Sodium: 136 mEq/L (ref 135–145)
Total Bilirubin: 0.3 mg/dL (ref 0.3–1.2)
Total Protein: 6.6 g/dL (ref 6.0–8.3)

## 2010-10-10 LAB — POCT I-STAT, CHEM 8
BUN: 6 mg/dL (ref 6–23)
Calcium, Ion: 1.06 mmol/L — ABNORMAL LOW (ref 1.12–1.32)
Chloride: 100 mEq/L (ref 96–112)
Creatinine, Ser: 1.1 mg/dL (ref 0.4–1.2)
Glucose, Bld: 109 mg/dL — ABNORMAL HIGH (ref 70–99)
HCT: 35 % — ABNORMAL LOW (ref 36.0–46.0)
Hemoglobin: 11.9 g/dL — ABNORMAL LOW (ref 12.0–15.0)
Potassium: 3 mEq/L — ABNORMAL LOW (ref 3.5–5.1)
Sodium: 136 mEq/L (ref 135–145)
TCO2: 26 mmol/L (ref 0–100)

## 2010-10-10 LAB — APTT: aPTT: 28 seconds (ref 24–37)

## 2010-10-10 LAB — LACTIC ACID, PLASMA: Lactic Acid, Venous: 1.6 mmol/L (ref 0.5–2.2)

## 2010-10-10 LAB — HEMOGLOBIN AND HEMATOCRIT, BLOOD
HCT: 33.9 % — ABNORMAL LOW (ref 36.0–46.0)
Hemoglobin: 11.9 g/dL — ABNORMAL LOW (ref 12.0–15.0)

## 2010-12-10 NOTE — Procedures (Signed)
[facility]  Patient:    [name], [name]                     MRN: [id number] Proc. Date: 06/22/00 Attending:  Verlin Grills, M.D. CC:         Yvette Rack. Dorna Bloom, M.D., [facility]   Procedure Report  PROCEDURE INDICATIONS:  [name] (Date of birth: Dec 29, 1940) is a 70 year old female with chronic gastroesophageal reflux disease, who is having upper abdominal discomfort despite taking her proton pump inhibitor.  I discussed with [name] the complications associated with esophagogastroduodenoscopy, including intestinal bleeding and intestinal perforation.  [name] has signed the operative permit.  CHRONIC MEDICATIONS:  Prinivil, hydrochlorothiazide, Pravachol, Prevacid, Valium, potassium and Premarin.  PAST MEDICAL HISTORY:  Palpitations, hypertension, hypercholesterolemia, gastroesophageal reflux disease, lumbar spine fusion, depression, two-pack per day cigarette smoking, remote cholecystectomy.  ENDOSCOPIST:  Charolett Bumpers, M.D.  PREMEDICATION:  Versed 12.5 mg, Demerol 100 mg.  ENDOSCOPE:  Olympus gastroscope.  PROCEDURE:  After obtaining informed consent, the patient was placed in the left lateral decubitus position.  I administered intravenous Demerol and intravenous Versed to achieve conscious sedation for the procedure.  The patients blood pressure, oxygen saturation and cardiac rhythm were monitored throughout the procedure and documented in the medical record.  The Olympus gastroscope was passed through the posterior hypopharynx into the proximal esophagus without difficulty.  The hypopharynx, larynx, and vocal cords appeared normal.  ESOPHAGOSCOPY:  The proximal, mid and lower segments of the esophagus appeared normal.  Endoscopically, there was no evidence for the presence of Barretts esophagus, erosive esophagitis, esophageal mucosal scarring or esophageal ulcerations.  The  squamocolumnar junction and esophagogastric junction were noted at approximately 37 cm from the incisor teeth.  GASTROSCOPY:  Retroflexed view of the gastric cardia and fundus was normal. There was no endoscopic evidence for the presence of a hiatal hernia.  The gastric body, antrum and pylorus appeared normal.  DUODENOSCOPY:  The duodenal bulb, mid duodenum, and distal duodenum appeared normal.  ASSESSMENT:  Normal esophagogastroduodenoscopy. DD:  06/22/00 TD:  06/22/00 Job: [id number]

## 2011-04-18 LAB — CULTURE, ROUTINE-ABSCESS: Gram Stain: NONE SEEN

## 2011-08-01 ENCOUNTER — Other Ambulatory Visit: Payer: Self-pay | Admitting: Internal Medicine

## 2011-08-01 DIAGNOSIS — Z1231 Encounter for screening mammogram for malignant neoplasm of breast: Secondary | ICD-10-CM

## 2011-08-02 ENCOUNTER — Encounter (INDEPENDENT_AMBULATORY_CARE_PROVIDER_SITE_OTHER): Payer: Self-pay | Admitting: Surgery

## 2011-08-04 ENCOUNTER — Ambulatory Visit
Admission: RE | Admit: 2011-08-04 | Discharge: 2011-08-04 | Disposition: A | Discharge status: Home / Self Care | Payer: Medicare Other | Source: Ambulatory Visit | Attending: Internal Medicine | Admitting: Internal Medicine

## 2011-08-04 DIAGNOSIS — Z1231 Encounter for screening mammogram for malignant neoplasm of breast: Secondary | ICD-10-CM

## 2011-08-16 ENCOUNTER — Ambulatory Visit (INDEPENDENT_AMBULATORY_CARE_PROVIDER_SITE_OTHER): Payer: Medicare Other | Admitting: Surgery

## 2011-08-16 ENCOUNTER — Encounter (INDEPENDENT_AMBULATORY_CARE_PROVIDER_SITE_OTHER): Payer: Self-pay | Admitting: Surgery

## 2011-08-16 VITALS — BP 130/70 | HR 72 | Temp 96.8°F | Resp 18 | Ht 61.5 in | Wt 164.4 lb

## 2011-08-16 DIAGNOSIS — L723 Sebaceous cyst: Secondary | ICD-10-CM

## 2011-08-16 DIAGNOSIS — L089 Local infection of the skin and subcutaneous tissue, unspecified: Secondary | ICD-10-CM | POA: Insufficient documentation

## 2011-08-16 NOTE — Progress Notes (Signed)
Patient ID: [name], female   DOB: Nov 16, 1940, 71 y.o.   MRN: [id number]  Chief Complaint  Patient presents with  . Other    Eval cyst on left side of neck    HPI [name] is a 71 y.o. female.  This is a pleasant female referred by Dr. Eula Listen for evaluation of a cyst on the left side of her neck. She reports it has been there for approximately 5 months and has intermittently drained. She denies fevers or chills. She has no previous history of similar skin lesions. She is otherwise without complaints. HPI  Past Medical History  Diagnosis Date  . Hypertension   . GERD (gastroesophageal reflux disease)   . Anxiety   . Depression   . COPD (chronic obstructive pulmonary disease)   . Low back pain   . Lumbar back pain   . Osteoporosis   . Osteoarthritis   . Hyperlipidemia   . Cyst     left side of neck    Past Surgical History  Procedure Date  . Cholecystectomy   . Appendectomy   . Total abdominal hysterectomy   . Back surgery   . Knee surgery     Right knee  . Hip surgery 01/2010    Left Hip     Family History  Problem Relation Age of Onset  . Heart disease Mother   . Heart disease Father     Social History History  Substance Use Topics  . Smoking status: Current Everyday Smoker -- 1.5 packs/day    Types: Cigarettes  . Smokeless tobacco: Never Used  . Alcohol Use: No    Allergies  Allergen Reactions  . Boniva (Ibandronate Sodium)   . Codeine Sulfate   . Macrobid Rash    Current Outpatient Prescriptions  Medication Sig Dispense Refill  . albuterol (PROVENTIL HFA;VENTOLIN HFA) 108 (90 BASE) MCG/ACT inhaler Inhale 2 puffs into the lungs every 6 (six) hours as needed.        . Alum & Mag Hydroxide-Simeth (MAGIC MOUTHWASH) SOLN Take by mouth.        . calcium carbonate (OS-CAL) 600 MG TABS Take 600 mg by mouth 2 (two) times daily with a meal.        . diazepam (VALIUM) 5 MG tablet Take 5 mg by mouth every 6 (six) hours as needed.        Marland Kitchen  esomeprazole (NEXIUM) 40 MG capsule Take 40 mg by mouth daily before breakfast.        . HYDROcodone-acetaminophen (VICODIN) 5-500 MG per tablet Take 1 tablet by mouth every 6 (six) hours as needed.        . hydrocortisone (ANUSOL-HC) 2.5 % rectal cream Place rectally 2 (two) times daily.        Marland Kitchen lisinopril-hydrochlorothiazide (PRINZIDE,ZESTORETIC) 20-12.5 MG per tablet Take 1 tablet by mouth daily.        . meclizine (ANTIVERT) 12.5 MG tablet Take 12.5 mg by mouth 3 (three) times daily as needed.        . metoCLOPramide (REGLAN) 5 MG tablet Take 5 mg by mouth 4 (four) times daily.        . Multiple Vitamins-Minerals (MULTIVITAMIN WITH MINERALS) tablet Take 1 tablet by mouth daily.        . nicotine (NICOTROL) 10 MG inhaler Inhale 1 puff into the lungs as needed.        Marland Kitchen oxybutynin (DITROPAN) 5 MG tablet Take 5 mg by mouth 3 (three) times  daily.        . potassium chloride (KLOR-CON) 20 MEQ packet Take 20 mEq by mouth 2 (two) times daily.        Marland Kitchen senna (SENOKOT) 8.6 MG tablet Take 1 tablet by mouth daily.        . simvastatin (ZOCOR) 40 MG tablet Take 40 mg by mouth every evening.        . tiotropium (SPIRIVA) 18 MCG inhalation capsule Place 18 mcg into inhaler and inhale daily.        . traMADol (ULTRAM) 50 MG tablet Take 50 mg by mouth every 6 (six) hours as needed.          Review of Systems Review of Systems  Constitutional: Negative for fever, chills and unexpected weight change.  HENT: Negative for hearing loss, congestion, sore throat, trouble swallowing and voice change.   Eyes: Negative for visual disturbance.  Respiratory: Negative for cough and wheezing.   Cardiovascular: Negative for chest pain, palpitations and leg swelling.  Gastrointestinal: Negative for nausea, vomiting, abdominal pain, diarrhea, constipation, blood in stool, abdominal distention and anal bleeding.  Genitourinary: Negative for hematuria, vaginal bleeding and difficulty urinating.  Musculoskeletal: Negative  for arthralgias.  Skin: Negative for rash and wound.  Neurological: Negative for seizures, syncope and headaches.  Hematological: Negative for adenopathy. Does not bruise/bleed easily.  Psychiatric/Behavioral: Negative for confusion.    Blood pressure 130/70, pulse 72, temperature 96.8 F (36 C), temperature source Temporal, resp. rate 18, height 5' 1.5" (1.562 m), weight 164 lb 6.4 oz (74.571 kg).  Physical Exam Physical Exam  Constitutional: She appears well-developed and well-nourished. No distress.  HENT:  Head: Normocephalic and atraumatic.  Right Ear: External ear normal.  Mouth/Throat: Oropharynx is clear and moist.  Eyes: Conjunctivae are normal. Pupils are equal, round, and reactive to light. No scleral icterus.  Neck: Normal range of motion. Neck supple. No tracheal deviation present. No thyromegaly present.       There is a 1-1-1/2 cm superficial nodule on the left side of her neck. I can palpate no adenopathy. There is no skin breakdown and no erythema  Cardiovascular: Normal rate, regular rhythm, normal heart sounds and intact distal pulses.   No murmur heard. Pulmonary/Chest: Effort normal and breath sounds normal. No respiratory distress. She has no wheezes. She has no rales.  Abdominal: Soft. Bowel sounds are normal. She exhibits no distension. There is no tenderness.  Musculoskeletal: Normal range of motion. She exhibits no edema and no tenderness.  Lymphadenopathy:    She has no cervical adenopathy.  Neurological: She is alert.  Skin: Skin is warm and dry. No rash noted. No erythema. No pallor.  Psychiatric: Her behavior is normal. Judgment normal.    Data Reviewed I have the notes from Dr. Eula Listen which I have reviewed  Assessment    Infected sebaceous cyst    Plan    Removal of this area is recommended. I have recommended this because of his infection and to rule out malignancy. I discussed this with her in detail. I discussed the risk of surgery which  includes but is not limited to bleeding infection as well as recurrence. I have also strongly encouraged her to quit smoking preoperatively. Likelihood of success is good       BLACKMAN,DOUGLAS A 08/16/2011, 11:44 AM

## 2011-08-24 ENCOUNTER — Encounter (HOSPITAL_BASED_OUTPATIENT_CLINIC_OR_DEPARTMENT_OTHER): Admission: RE | Admit: 2011-08-24 | Payer: Medicare Other | Source: Ambulatory Visit

## 2011-08-24 ENCOUNTER — Other Ambulatory Visit: Payer: Self-pay

## 2011-08-24 ENCOUNTER — Encounter (HOSPITAL_BASED_OUTPATIENT_CLINIC_OR_DEPARTMENT_OTHER): Payer: Self-pay | Admitting: *Deleted

## 2011-08-24 LAB — BASIC METABOLIC PANEL
BUN: 8 mg/dL (ref 6–23)
CO2: 29 mEq/L (ref 19–32)
Calcium: 9.6 mg/dL (ref 8.4–10.5)
Chloride: 92 mEq/L — ABNORMAL LOW (ref 96–112)
Creatinine, Ser: 0.94 mg/dL (ref 0.50–1.10)
GFR calc Af Amer: 71 mL/min — ABNORMAL LOW (ref >90)
GFR calc non Af Amer: 61 mL/min — ABNORMAL LOW (ref >90)
Glucose, Bld: 119 mg/dL — ABNORMAL HIGH (ref 70–99)
Potassium: 3.9 mEq/L (ref 3.5–5.1)
Sodium: 132 mEq/L — ABNORMAL LOW (ref 135–145)

## 2011-08-25 ENCOUNTER — Encounter (HOSPITAL_BASED_OUTPATIENT_CLINIC_OR_DEPARTMENT_OTHER): Payer: Self-pay | Admitting: Certified Registered"

## 2011-08-25 ENCOUNTER — Ambulatory Visit (HOSPITAL_BASED_OUTPATIENT_CLINIC_OR_DEPARTMENT_OTHER): Payer: Medicare Other | Admitting: Certified Registered"

## 2011-08-25 ENCOUNTER — Encounter (HOSPITAL_BASED_OUTPATIENT_CLINIC_OR_DEPARTMENT_OTHER)
Admission: RE | Disposition: A | Discharge status: Home / Self Care | Payer: Self-pay | Source: Ambulatory Visit | Attending: Surgery

## 2011-08-25 ENCOUNTER — Other Ambulatory Visit (INDEPENDENT_AMBULATORY_CARE_PROVIDER_SITE_OTHER): Payer: Self-pay | Admitting: Surgery

## 2011-08-25 ENCOUNTER — Encounter (HOSPITAL_BASED_OUTPATIENT_CLINIC_OR_DEPARTMENT_OTHER): Payer: Self-pay | Admitting: *Deleted

## 2011-08-25 ENCOUNTER — Ambulatory Visit (HOSPITAL_BASED_OUTPATIENT_CLINIC_OR_DEPARTMENT_OTHER)
Admission: RE | Admit: 2011-08-25 | Discharge: 2011-08-25 | Disposition: A | Discharge status: Home / Self Care | Payer: Medicare Other | Source: Ambulatory Visit | Attending: Surgery | Admitting: Surgery

## 2011-08-25 DIAGNOSIS — Z01810 Encounter for preprocedural cardiovascular examination: Secondary | ICD-10-CM | POA: Insufficient documentation

## 2011-08-25 DIAGNOSIS — D367 Benign neoplasm of other specified sites: Secondary | ICD-10-CM | POA: Insufficient documentation

## 2011-08-25 DIAGNOSIS — K219 Gastro-esophageal reflux disease without esophagitis: Secondary | ICD-10-CM | POA: Insufficient documentation

## 2011-08-25 DIAGNOSIS — L723 Sebaceous cyst: Secondary | ICD-10-CM

## 2011-08-25 DIAGNOSIS — I10 Essential (primary) hypertension: Secondary | ICD-10-CM | POA: Insufficient documentation

## 2011-08-25 DIAGNOSIS — Z01812 Encounter for preprocedural laboratory examination: Secondary | ICD-10-CM | POA: Insufficient documentation

## 2011-08-25 HISTORY — PX: MASS EXCISION: SHX2000

## 2011-08-25 LAB — POCT HEMOGLOBIN-HEMACUE: Hemoglobin: 13.8 g/dL (ref 12.0–15.0)

## 2011-08-25 SURGERY — EXCISION MASS
Anesthesia: Monitor Anesthesia Care | Site: Neck | Wound class: Clean

## 2011-08-25 MED ORDER — LORAZEPAM 2 MG/ML IJ SOLN: 1.0000 mg | Freq: Once | INTRAMUSCULAR | Status: DC | PRN

## 2011-08-25 MED ORDER — LIDOCAINE HCL (CARDIAC) 20 MG/ML IV SOLN
INTRAVENOUS | Status: DC | PRN
  Administered 2011-08-25: 100 mg via INTRAVENOUS

## 2011-08-25 MED ORDER — CEFAZOLIN SODIUM 1-5 GM-% IV SOLN
1.0000 g | INTRAVENOUS | Status: AC
  Administered 2011-08-25: 1 g via INTRAVENOUS

## 2011-08-25 MED ORDER — PROMETHAZINE HCL 25 MG/ML IJ SOLN: 12.5000 mg | Freq: Four times a day (QID) | INTRAMUSCULAR | Status: DC | PRN

## 2011-08-25 MED ORDER — SODIUM CHLORIDE 0.9 % IJ SOLN: 3.0000 mL | Freq: Two times a day (BID) | INTRAMUSCULAR | Status: DC

## 2011-08-25 MED ORDER — ACETAMINOPHEN 650 MG RE SUPP: 650.0000 mg | RECTAL | Status: DC | PRN

## 2011-08-25 MED ORDER — LACTATED RINGERS IV SOLN
INTRAVENOUS | Status: DC
  Administered 2011-08-25: 07:00:00 via INTRAVENOUS

## 2011-08-25 MED ORDER — OXYCODONE HCL 5 MG PO TABS: 5.0000 mg | ORAL_TABLET | ORAL | Status: DC | PRN

## 2011-08-25 MED ORDER — MIDAZOLAM HCL 5 MG/5ML IJ SOLN
INTRAMUSCULAR | Status: DC | PRN
  Administered 2011-08-25: 1 mg via INTRAVENOUS
  Administered 2011-08-25 (×2): 0.5 mg via INTRAVENOUS
  Administered 2011-08-25: 2 mg via INTRAVENOUS

## 2011-08-25 MED ORDER — FENTANYL CITRATE 0.05 MG/ML IJ SOLN
INTRAMUSCULAR | Status: DC | PRN
  Administered 2011-08-25: 100 ug via INTRAVENOUS

## 2011-08-25 MED ORDER — SODIUM CHLORIDE 0.9 % IJ SOLN: 3.0000 mL | INTRAMUSCULAR | Status: DC | PRN

## 2011-08-25 MED ORDER — FENTANYL CITRATE 0.05 MG/ML IJ SOLN: 50.0000 ug | INTRAMUSCULAR | Status: DC | PRN

## 2011-08-25 MED ORDER — HYDROCODONE-ACETAMINOPHEN 5-325 MG PO TABS: 1.0000 | ORAL_TABLET | Freq: Four times a day (QID) | ORAL | Status: AC | PRN

## 2011-08-25 MED ORDER — PROPOFOL 10 MG/ML IV EMUL
INTRAVENOUS | Status: DC | PRN
  Administered 2011-08-25: 125 ug/kg/min via INTRAVENOUS

## 2011-08-25 MED ORDER — SODIUM CHLORIDE 0.9 % IV SOLN: 250.0000 mL | INTRAVENOUS | Status: DC | PRN

## 2011-08-25 MED ORDER — HYDROMORPHONE HCL PF 1 MG/ML IJ SOLN: 0.2500 mg | INTRAMUSCULAR | Status: DC | PRN

## 2011-08-25 MED ORDER — MORPHINE SULFATE 2 MG/ML IJ SOLN: 1.0000 mg | INTRAMUSCULAR | Status: DC | PRN

## 2011-08-25 MED ORDER — PROMETHAZINE HCL 25 MG/ML IJ SOLN: 6.2500 mg | INTRAMUSCULAR | Status: DC | PRN

## 2011-08-25 MED ORDER — ACETAMINOPHEN 325 MG PO TABS: 650.0000 mg | ORAL_TABLET | ORAL | Status: DC | PRN

## 2011-08-25 MED ORDER — MIDAZOLAM HCL 2 MG/2ML IJ SOLN: 1.0000 mg | INTRAMUSCULAR | Status: DC | PRN

## 2011-08-25 MED ORDER — LIDOCAINE-EPINEPHRINE (PF) 1 %-1:200000 IJ SOLN
INTRAMUSCULAR | Status: DC | PRN
  Administered 2011-08-25: 10 mL

## 2011-08-25 MED ORDER — ONDANSETRON HCL 4 MG/2ML IJ SOLN: 4.0000 mg | Freq: Four times a day (QID) | INTRAMUSCULAR | Status: DC | PRN

## 2011-08-25 SURGICAL SUPPLY — 49 items
APL SKNCLS STERI-STRIP NONHPOA (GAUZE/BANDAGES/DRESSINGS) ×1
BENZOIN TINCTURE PRP APPL 2/3 (GAUZE/BANDAGES/DRESSINGS) ×2 IMPLANT
BLADE HEX COATED 2.75 (ELECTRODE) ×2 IMPLANT
BLADE SURG 15 STRL LF DISP TIS (BLADE) ×1 IMPLANT
BLADE SURG 15 STRL SS (BLADE) ×2
BLADE SURG ROTATE 9660 (MISCELLANEOUS) IMPLANT
CANISTER SUCTION 1200CC (MISCELLANEOUS) ×1 IMPLANT
CHLORAPREP W/TINT 26ML (MISCELLANEOUS) ×2 IMPLANT
CLOTH BEACON ORANGE TIMEOUT ST (SAFETY) ×2 IMPLANT
COVER MAYO STAND STRL (DRAPES) ×2 IMPLANT
COVER TABLE BACK 60X90 (DRAPES) ×2 IMPLANT
DECANTER SPIKE VIAL GLASS SM (MISCELLANEOUS) IMPLANT
DRAPE PED LAPAROTOMY (DRAPES) ×2 IMPLANT
DRAPE UTILITY XL STRL (DRAPES) ×2 IMPLANT
DRSG TEGADERM 2-3/8X2-3/4 SM (GAUZE/BANDAGES/DRESSINGS) ×1 IMPLANT
DRSG TEGADERM 4X4.75 (GAUZE/BANDAGES/DRESSINGS) ×2 IMPLANT
ELECT REM PT RETURN 9FT ADLT (ELECTROSURGICAL) ×2
ELECTRODE REM PT RTRN 9FT ADLT (ELECTROSURGICAL) ×1 IMPLANT
GAUZE SPONGE 4X4 12PLY STRL LF (GAUZE/BANDAGES/DRESSINGS) ×2 IMPLANT
GLOVE BIO SURGEON STRL SZ 6.5 (GLOVE) ×1 IMPLANT
GLOVE BIO SURGEON STRL SZ7 (GLOVE) ×1 IMPLANT
GLOVE BIOGEL PI IND STRL 6.5 (GLOVE) IMPLANT
GLOVE BIOGEL PI IND STRL 7.0 (GLOVE) IMPLANT
GLOVE BIOGEL PI INDICATOR 6.5 (GLOVE) ×1
GLOVE BIOGEL PI INDICATOR 7.0 (GLOVE) ×1
GLOVE SURG SIGNA 7.5 PF LTX (GLOVE) ×2 IMPLANT
GOWN PREVENTION PLUS XLARGE (GOWN DISPOSABLE) ×3 IMPLANT
GOWN PREVENTION PLUS XXLARGE (GOWN DISPOSABLE) ×2 IMPLANT
NDL HYPO 25X1 1.5 SAFETY (NEEDLE) ×1 IMPLANT
NEEDLE HYPO 25X1 1.5 SAFETY (NEEDLE) ×2 IMPLANT
NS IRRIG 1000ML POUR BTL (IV SOLUTION) ×1 IMPLANT
PACK BASIN DAY SURGERY FS (CUSTOM PROCEDURE TRAY) ×2 IMPLANT
PENCIL BUTTON HOLSTER BLD 10FT (ELECTRODE) ×2 IMPLANT
SLEEVE SCD COMPRESS KNEE MED (MISCELLANEOUS) IMPLANT
SPONGE GAUZE 2X2 8PLY STRL LF (GAUZE/BANDAGES/DRESSINGS) ×1 IMPLANT
SPONGE LAP 4X18 X RAY DECT (DISPOSABLE) ×1 IMPLANT
STRIP CLOSURE SKIN 1/2X4 (GAUZE/BANDAGES/DRESSINGS) ×2 IMPLANT
SUT MNCRL AB 4-0 PS2 18 (SUTURE) ×1 IMPLANT
SUT VIC AB 2-0 SH 27 (SUTURE)
SUT VIC AB 2-0 SH 27XBRD (SUTURE) IMPLANT
SUT VIC AB 3-0 SH 27 (SUTURE) ×2
SUT VIC AB 3-0 SH 27X BRD (SUTURE) IMPLANT
SYR BULB 3OZ (MISCELLANEOUS) IMPLANT
SYR CONTROL 10ML LL (SYRINGE) ×2 IMPLANT
TOWEL OR 17X24 6PK STRL BLUE (TOWEL DISPOSABLE) ×2 IMPLANT
TOWEL OR NON WOVEN STRL DISP B (DISPOSABLE) ×2 IMPLANT
TUBE CONNECTING 20X1/4 (TUBING) ×1 IMPLANT
WATER STERILE IRR 1000ML POUR (IV SOLUTION) ×1 IMPLANT
YANKAUER SUCT BULB TIP NO VENT (SUCTIONS) ×1 IMPLANT

## 2011-08-25 NOTE — Op Note (Signed)
08/25/2011  7:56 AM  PATIENT:  [name]  71 y.o. female  PRE-OPERATIVE DIAGNOSIS:  left neck mass  POST-OPERATIVE DIAGNOSIS:  left neck mass  PROCEDURE:  Procedure(s): EXCISION MASS  SURGEON:  Surgeon(s): Shelly Rubenstein, MD  PHYSICIAN ASSISTANT:   ASSISTANTS: none   ANESTHESIA:   IV sedation  EBL:  Total I/O In: 800 [I.V.:800] Out: -   BLOOD ADMINISTERED:none  DRAINS: none   LOCAL MEDICATIONS USED:  XYLOCAINE 10CC  SPECIMEN:  Excision  DISPOSITION OF SPECIMEN:  PATHOLOGY  COUNTS:  YES  TOURNIQUET:  * No tourniquets in log *  DICTATION: .Dragon Dictation Procedure: The patient was brought to the operating room and identified as the correct patient. She was placed supine on the operating room table and anesthesia was induced. Her left neck was then prepped and draped in the usual sterile fashion. The skin lesion was just anterior to the sternocleidomastoid muscle. I anesthetized the surrounding skin with lidocaine with epinephrine. I then performed an elliptical incision with the scalpel around the skin lesion. I then took this down to the subcutaneous tissue and platysma with electrocautery. Wide excision was then achieved with cautery. The specimen was sent to pathology for evaluation. Hemostasis achieved with cautery. I then closed the subcutaneous tissue with interrupted 3-0 Vicryl sutures and then closed the skin with running 4-0 Monocryl suture. Steri-Strips, gauze, and Tegaderm were then applied. The patient tolerated the procedure well. All counts were correct at the end of the procedure. The patient was then taken in a stable condition to the recovery room. PLAN OF CARE: Discharge to home after PACU  PATIENT DISPOSITION:  PACU - hemodynamically stable.   Delay start of Pharmacological VTE agent (>24hrs) due to surgical blood loss or risk of bleeding:  {YES/NO/NOT APPLICABLE:20182

## 2011-08-25 NOTE — Transfer of Care (Signed)
Immediate Anesthesia Transfer of Care Note  Patient: [name]  Procedure(s) Performed:  EXCISION MASS - excision left neck mass  Patient Location: PACU  Anesthesia Type: MAC  Level of Consciousness: awake, alert , oriented and patient cooperative  Airway & Oxygen Therapy: Patient Spontanous Breathing and Patient connected to nasal cannula oxygen  Post-op Assessment: Report given to PACU RN and Post -op Vital signs reviewed and stable  Post vital signs: Reviewed and stable  Complications: No apparent anesthesia complications

## 2011-08-25 NOTE — Anesthesia Postprocedure Evaluation (Signed)
  Anesthesia Post-op Note  Patient: [name]  Procedure(s) Performed:  EXCISION MASS - excision left neck mass  Patient Location: PACU  Anesthesia Type: General  Level of Consciousness: awake and alert   Airway and Oxygen Therapy: Patient Spontanous Breathing  Post-op Pain: mild  Post-op Assessment: Post-op Vital signs reviewed, Patient's Cardiovascular Status Stable, Respiratory Function Stable, Patent Airway, No signs of Nausea or vomiting, Adequate PO intake and Pain level controlled  Post-op Vital Signs: stable  Complications: No apparent anesthesia complications

## 2011-08-25 NOTE — Anesthesia Procedure Notes (Addendum)
Date/Time: 08/25/2011 7:35 AM Performed by: Radford Pax   Procedure Name: MAC Date/Time: 08/25/2011 7:35 AM Performed by: Radford Pax Pre-anesthesia Checklist: Patient identified, Timeout performed, Emergency Drugs available, Suction available and Patient being monitored Patient Re-evaluated:Patient Re-evaluated prior to inductionOxygen Delivery Method: Simple face mask Placement Confirmation: positive ETCO2 Dental Injury: Teeth and Oropharynx as per pre-operative assessment

## 2011-08-25 NOTE — H&P (View-Only) (Signed)
Patient ID: [name], female   DOB: 05/27/1941, 70 y.o.   MRN: [id number]  Chief Complaint  Patient presents with  . Other    Eval cyst on left side of neck    HPI [name] is a 70 y.o. female.  This is a pleasant female referred by Dr. Hussain for evaluation of a cyst on the left side of her neck. She reports it has been there for approximately 5 months and has intermittently drained. She denies fevers or chills. She has no previous history of similar skin lesions. She is otherwise without complaints. HPI  Past Medical History  Diagnosis Date  . Hypertension   . GERD (gastroesophageal reflux disease)   . Anxiety   . Depression   . COPD (chronic obstructive pulmonary disease)   . Low back pain   . Lumbar back pain   . Osteoporosis   . Osteoarthritis   . Hyperlipidemia   . Cyst     left side of neck    Past Surgical History  Procedure Date  . Cholecystectomy   . Appendectomy   . Total abdominal hysterectomy   . Back surgery   . Knee surgery     Right knee  . Hip surgery 01/2010    Left Hip     Family History  Problem Relation Age of Onset  . Heart disease Mother   . Heart disease Father     Social History History  Substance Use Topics  . Smoking status: Current Everyday Smoker -- 1.5 packs/day    Types: Cigarettes  . Smokeless tobacco: Never Used  . Alcohol Use: No    Allergies  Allergen Reactions  . Boniva (Ibandronate Sodium)   . Codeine Sulfate   . Macrobid Rash    Current Outpatient Prescriptions  Medication Sig Dispense Refill  . albuterol (PROVENTIL HFA;VENTOLIN HFA) 108 (90 BASE) MCG/ACT inhaler Inhale 2 puffs into the lungs every 6 (six) hours as needed.        . Alum & Mag Hydroxide-Simeth (MAGIC MOUTHWASH) SOLN Take by mouth.        . calcium carbonate (OS-CAL) 600 MG TABS Take 600 mg by mouth 2 (two) times daily with a meal.        . diazepam (VALIUM) 5 MG tablet Take 5 mg by mouth every 6 (six) hours as needed.        .  esomeprazole (NEXIUM) 40 MG capsule Take 40 mg by mouth daily before breakfast.        . HYDROcodone-acetaminophen (VICODIN) 5-500 MG per tablet Take 1 tablet by mouth every 6 (six) hours as needed.        . hydrocortisone (ANUSOL-HC) 2.5 % rectal cream Place rectally 2 (two) times daily.        . lisinopril-hydrochlorothiazide (PRINZIDE,ZESTORETIC) 20-12.5 MG per tablet Take 1 tablet by mouth daily.        . meclizine (ANTIVERT) 12.5 MG tablet Take 12.5 mg by mouth 3 (three) times daily as needed.        . metoCLOPramide (REGLAN) 5 MG tablet Take 5 mg by mouth 4 (four) times daily.        . Multiple Vitamins-Minerals (MULTIVITAMIN WITH MINERALS) tablet Take 1 tablet by mouth daily.        . nicotine (NICOTROL) 10 MG inhaler Inhale 1 puff into the lungs as needed.        . oxybutynin (DITROPAN) 5 MG tablet Take 5 mg by mouth 3 (three) times   daily.        . potassium chloride (KLOR-CON) 20 MEQ packet Take 20 mEq by mouth 2 (two) times daily.        . senna (SENOKOT) 8.6 MG tablet Take 1 tablet by mouth daily.        . simvastatin (ZOCOR) 40 MG tablet Take 40 mg by mouth every evening.        . tiotropium (SPIRIVA) 18 MCG inhalation capsule Place 18 mcg into inhaler and inhale daily.        . traMADol (ULTRAM) 50 MG tablet Take 50 mg by mouth every 6 (six) hours as needed.          Review of Systems Review of Systems  Constitutional: Negative for fever, chills and unexpected weight change.  HENT: Negative for hearing loss, congestion, sore throat, trouble swallowing and voice change.   Eyes: Negative for visual disturbance.  Respiratory: Negative for cough and wheezing.   Cardiovascular: Negative for chest pain, palpitations and leg swelling.  Gastrointestinal: Negative for nausea, vomiting, abdominal pain, diarrhea, constipation, blood in stool, abdominal distention and anal bleeding.  Genitourinary: Negative for hematuria, vaginal bleeding and difficulty urinating.  Musculoskeletal: Negative  for arthralgias.  Skin: Negative for rash and wound.  Neurological: Negative for seizures, syncope and headaches.  Hematological: Negative for adenopathy. Does not bruise/bleed easily.  Psychiatric/Behavioral: Negative for confusion.    Blood pressure 130/70, pulse 72, temperature 96.8 F (36 C), temperature source Temporal, resp. rate 18, height 5' 1.5" (1.562 m), weight 164 lb 6.4 oz (74.571 kg).  Physical Exam Physical Exam  Constitutional: She appears well-developed and well-nourished. No distress.  HENT:  Head: Normocephalic and atraumatic.  Right Ear: External ear normal.  Mouth/Throat: Oropharynx is clear and moist.  Eyes: Conjunctivae are normal. Pupils are equal, round, and reactive to light. No scleral icterus.  Neck: Normal range of motion. Neck supple. No tracheal deviation present. No thyromegaly present.       There is a 1-1-1/2 cm superficial nodule on the left side of her neck. I can palpate no adenopathy. There is no skin breakdown and no erythema  Cardiovascular: Normal rate, regular rhythm, normal heart sounds and intact distal pulses.   No murmur heard. Pulmonary/Chest: Effort normal and breath sounds normal. No respiratory distress. She has no wheezes. She has no rales.  Abdominal: Soft. Bowel sounds are normal. She exhibits no distension. There is no tenderness.  Musculoskeletal: Normal range of motion. She exhibits no edema and no tenderness.  Lymphadenopathy:    She has no cervical adenopathy.  Neurological: She is alert.  Skin: Skin is warm and dry. No rash noted. No erythema. No pallor.  Psychiatric: Her behavior is normal. Judgment normal.    Data Reviewed I have the notes from Dr. Hussain which I have reviewed  Assessment    Infected sebaceous cyst    Plan    Removal of this area is recommended. I have recommended this because of his infection and to rule out malignancy. I discussed this with her in detail. I discussed the risk of surgery which  includes but is not limited to bleeding infection as well as recurrence. I have also strongly encouraged her to quit smoking preoperatively. Likelihood of success is good       BLACKMAN,DOUGLAS A 08/16/2011, 11:44 AM    

## 2011-08-25 NOTE — Anesthesia Preprocedure Evaluation (Signed)
Anesthesia Evaluation  Patient identified by MRN, date of birth, ID band Patient awake    Reviewed: Allergy & Precautions, H&P , NPO status , Patient's Chart, lab work & pertinent test results  Airway Mallampati: I TM Distance: >3 FB Neck ROM: Full    Dental   Pulmonary COPD   Pulmonary exam normal       Cardiovascular hypertension,     Neuro/Psych Anxiety Depression    GI/Hepatic GERD-  ,  Endo/Other    Renal/GU      Musculoskeletal   Abdominal   Peds  Hematology   Anesthesia Other Findings   Reproductive/Obstetrics                           Anesthesia Physical Anesthesia Plan  ASA: II  Anesthesia Plan: MAC   Post-op Pain Management:    Induction: Intravenous  Airway Management Planned: Simple Face Mask  Additional Equipment:   Intra-op Plan:   Post-operative Plan: Extubation in OR  Informed Consent: I have reviewed the patients History and Physical, chart, labs and discussed the procedure including the risks, benefits and alternatives for the proposed anesthesia with the patient or authorized representative who has indicated his/her understanding and acceptance.     Plan Discussed with: CRNA and Surgeon  Anesthesia Plan Comments:         Anesthesia Quick Evaluation

## 2011-08-25 NOTE — Interval H&P Note (Signed)
History and Physical Interval Note: there has been no change in her history or exam  08/25/2011 7:08 AM  [name]  has presented today for surgery, with the diagnosis of left neck mass  The various methods of treatment have been discussed with the patient and family. After consideration of risks, benefits and other options for treatment, the patient has consented to  Procedure(s): EXCISION MASS LEFT NECK as a surgical intervention .  The patients' history has been reviewed, patient examined, no change in status, stable for surgery.  I have reviewed the patients' chart and labs.  Questions were answered to the patient's satisfaction.     BLACKMAN,DOUGLAS A

## 2011-08-29 ENCOUNTER — Encounter (HOSPITAL_BASED_OUTPATIENT_CLINIC_OR_DEPARTMENT_OTHER): Payer: Self-pay | Admitting: Surgery

## 2011-09-12 ENCOUNTER — Encounter (INDEPENDENT_AMBULATORY_CARE_PROVIDER_SITE_OTHER): Payer: Self-pay | Admitting: Surgery

## 2011-09-12 ENCOUNTER — Ambulatory Visit (INDEPENDENT_AMBULATORY_CARE_PROVIDER_SITE_OTHER): Payer: Medicare Other | Admitting: Surgery

## 2011-09-12 ENCOUNTER — Other Ambulatory Visit (INDEPENDENT_AMBULATORY_CARE_PROVIDER_SITE_OTHER): Payer: Self-pay | Admitting: Surgery

## 2011-09-12 VITALS — BP 134/88 | HR 80 | Temp 97.6°F | Resp 20 | Ht 61.5 in | Wt 164.0 lb

## 2011-09-12 DIAGNOSIS — Z09 Encounter for follow-up examination after completed treatment for conditions other than malignant neoplasm: Secondary | ICD-10-CM

## 2011-09-12 NOTE — Progress Notes (Signed)
Subjective:     Patient ID: [name], female   DOB: 10/29/1940, 71 y.o.   MRN: [id number]  HPI She is here for her first postoperative visit. She has no complaints.  Review of Systems     Objective:   Physical Exam    On exam, the incision is healing well. The final pathology showed a mass is consistent with a infected cyst Assessment:     Patient status post excision of sebaceous cyst    Plan:     I will see her back as needed

## 2011-09-12 NOTE — Telephone Encounter (Signed)
Faxed refill for Hydrocodone/apap 5-325 #30 faxed back [facility] phone [phone]

## 2011-10-10 ENCOUNTER — Telehealth (INDEPENDENT_AMBULATORY_CARE_PROVIDER_SITE_OTHER): Payer: Self-pay | Admitting: General Surgery

## 2011-10-10 NOTE — Telephone Encounter (Signed)
Faxed back to the [facility] no refill on Hydrocodone 5/325. Pt has not been seen since 08/2011. Pt needs call her PCP for pain med

## 2012-01-03 ENCOUNTER — Other Ambulatory Visit (HOSPITAL_COMMUNITY): Payer: Self-pay | Admitting: Internal Medicine

## 2012-01-03 DIAGNOSIS — J449 Chronic obstructive pulmonary disease, unspecified: Secondary | ICD-10-CM

## 2012-01-10 ENCOUNTER — Ambulatory Visit (HOSPITAL_COMMUNITY)
Admission: RE | Admit: 2012-01-10 | Discharge: 2012-01-10 | Disposition: A | Discharge status: Home / Self Care | Payer: Medicare Other | Source: Ambulatory Visit | Attending: Internal Medicine | Admitting: Internal Medicine

## 2012-01-10 DIAGNOSIS — J4489 Other specified chronic obstructive pulmonary disease: Secondary | ICD-10-CM | POA: Insufficient documentation

## 2012-01-10 DIAGNOSIS — J449 Chronic obstructive pulmonary disease, unspecified: Secondary | ICD-10-CM | POA: Insufficient documentation

## 2012-01-10 MED ORDER — ALBUTEROL SULFATE (5 MG/ML) 0.5% IN NEBU
2.5000 mg | INHALATION_SOLUTION | Freq: Once | RESPIRATORY_TRACT | Status: AC
  Administered 2012-01-10: 2.5 mg via RESPIRATORY_TRACT

## 2013-04-26 ENCOUNTER — Other Ambulatory Visit: Payer: Self-pay

## 2013-04-26 DIAGNOSIS — Z1231 Encounter for screening mammogram for malignant neoplasm of breast: Secondary | ICD-10-CM

## 2013-04-29 ENCOUNTER — Ambulatory Visit: Payer: Medicare Other

## 2013-06-05 ENCOUNTER — Ambulatory Visit
Admission: RE | Admit: 2013-06-05 | Discharge: 2013-06-05 | Disposition: A | Discharge status: Home / Self Care | Payer: Medicare Other | Source: Ambulatory Visit

## 2013-06-05 DIAGNOSIS — Z1231 Encounter for screening mammogram for malignant neoplasm of breast: Secondary | ICD-10-CM

## 2013-06-06 ENCOUNTER — Ambulatory Visit: Payer: Medicare Other

## 2013-06-29 IMAGING — CR DG FOOT 2V*L*
2 series · 2 of 2 positions shown · non-contrast
Comparison: None.

CLINICAL DATA: Three-week history of pain

EXAM:
LEFT FOOT - 2 VIEW

[t foot ap left]
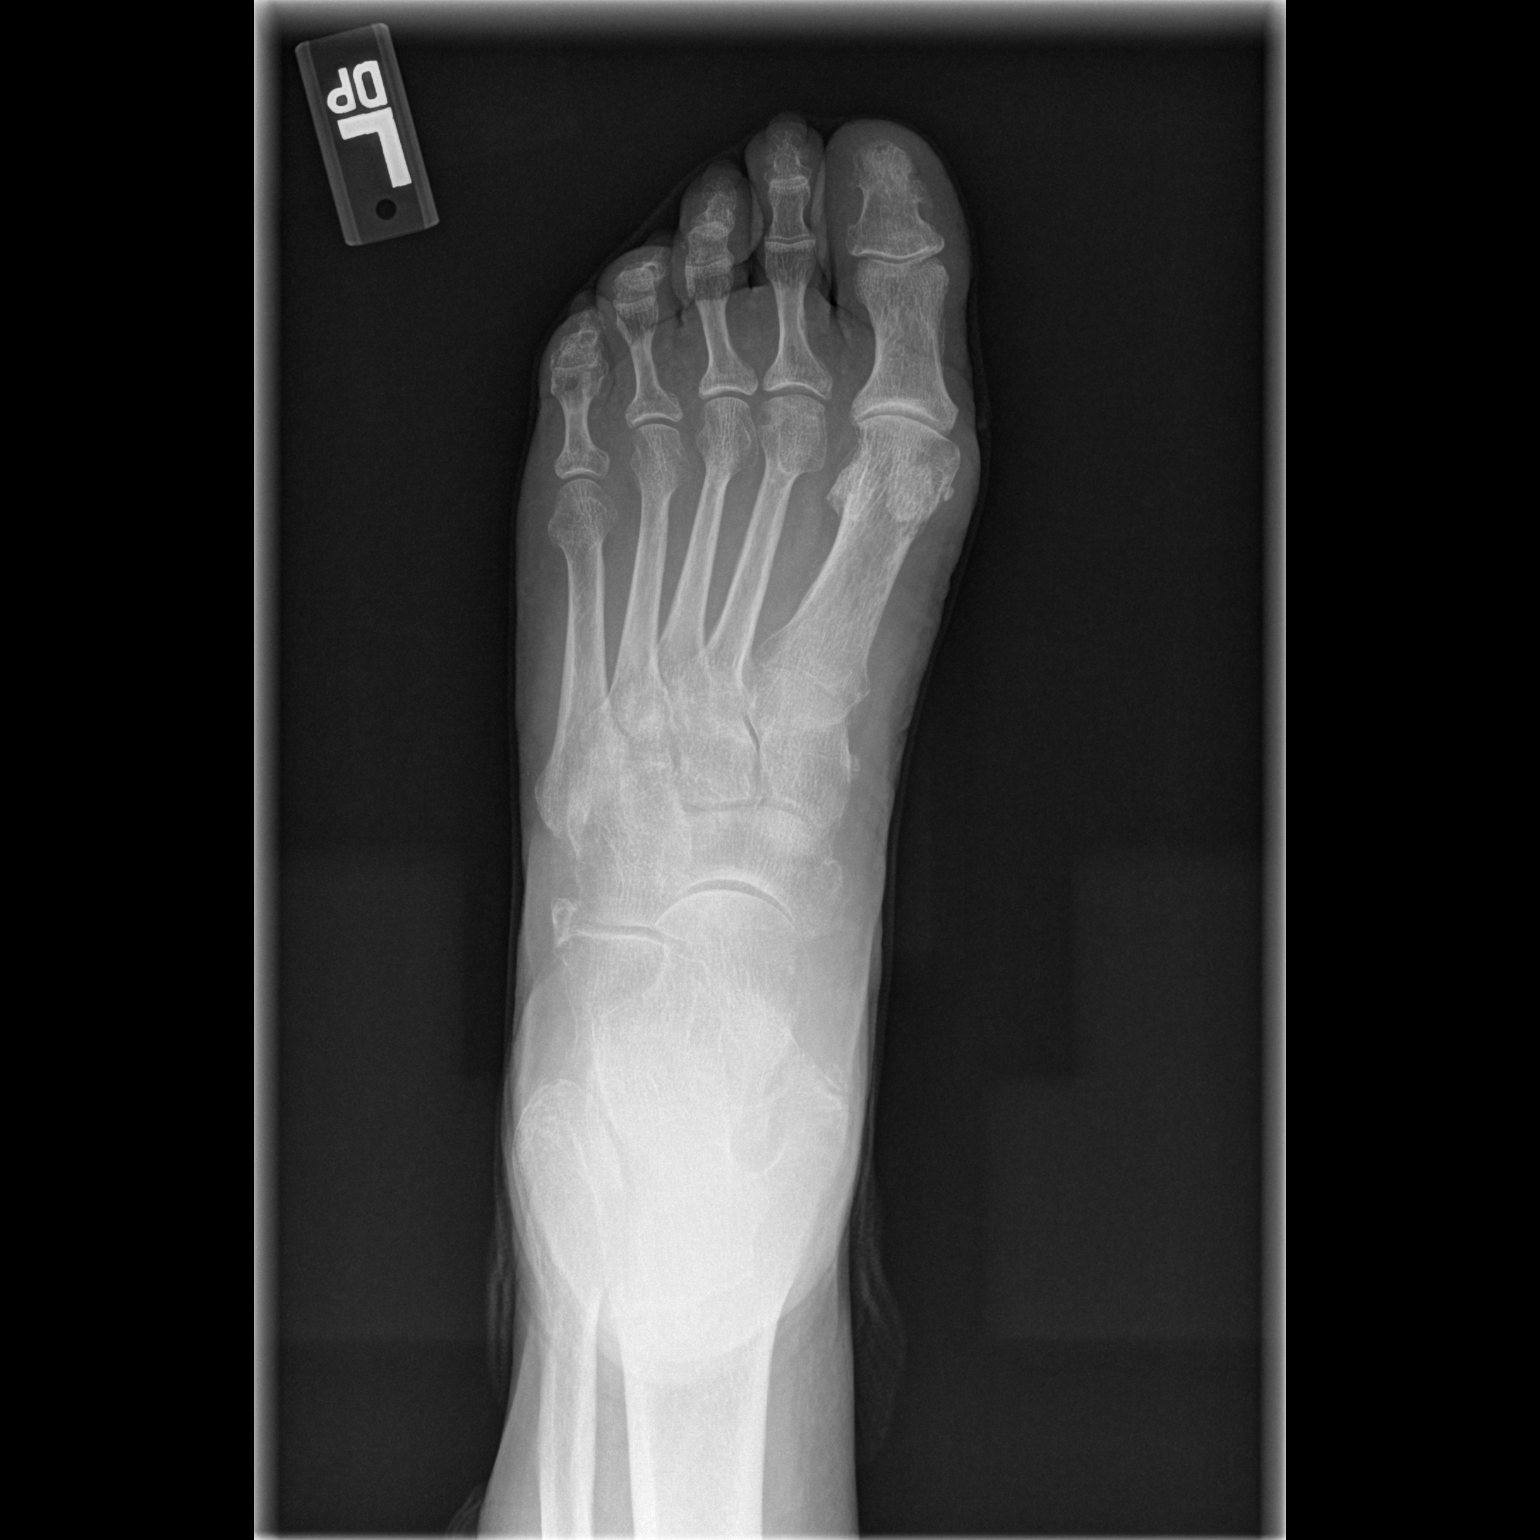

[t foot lat left]
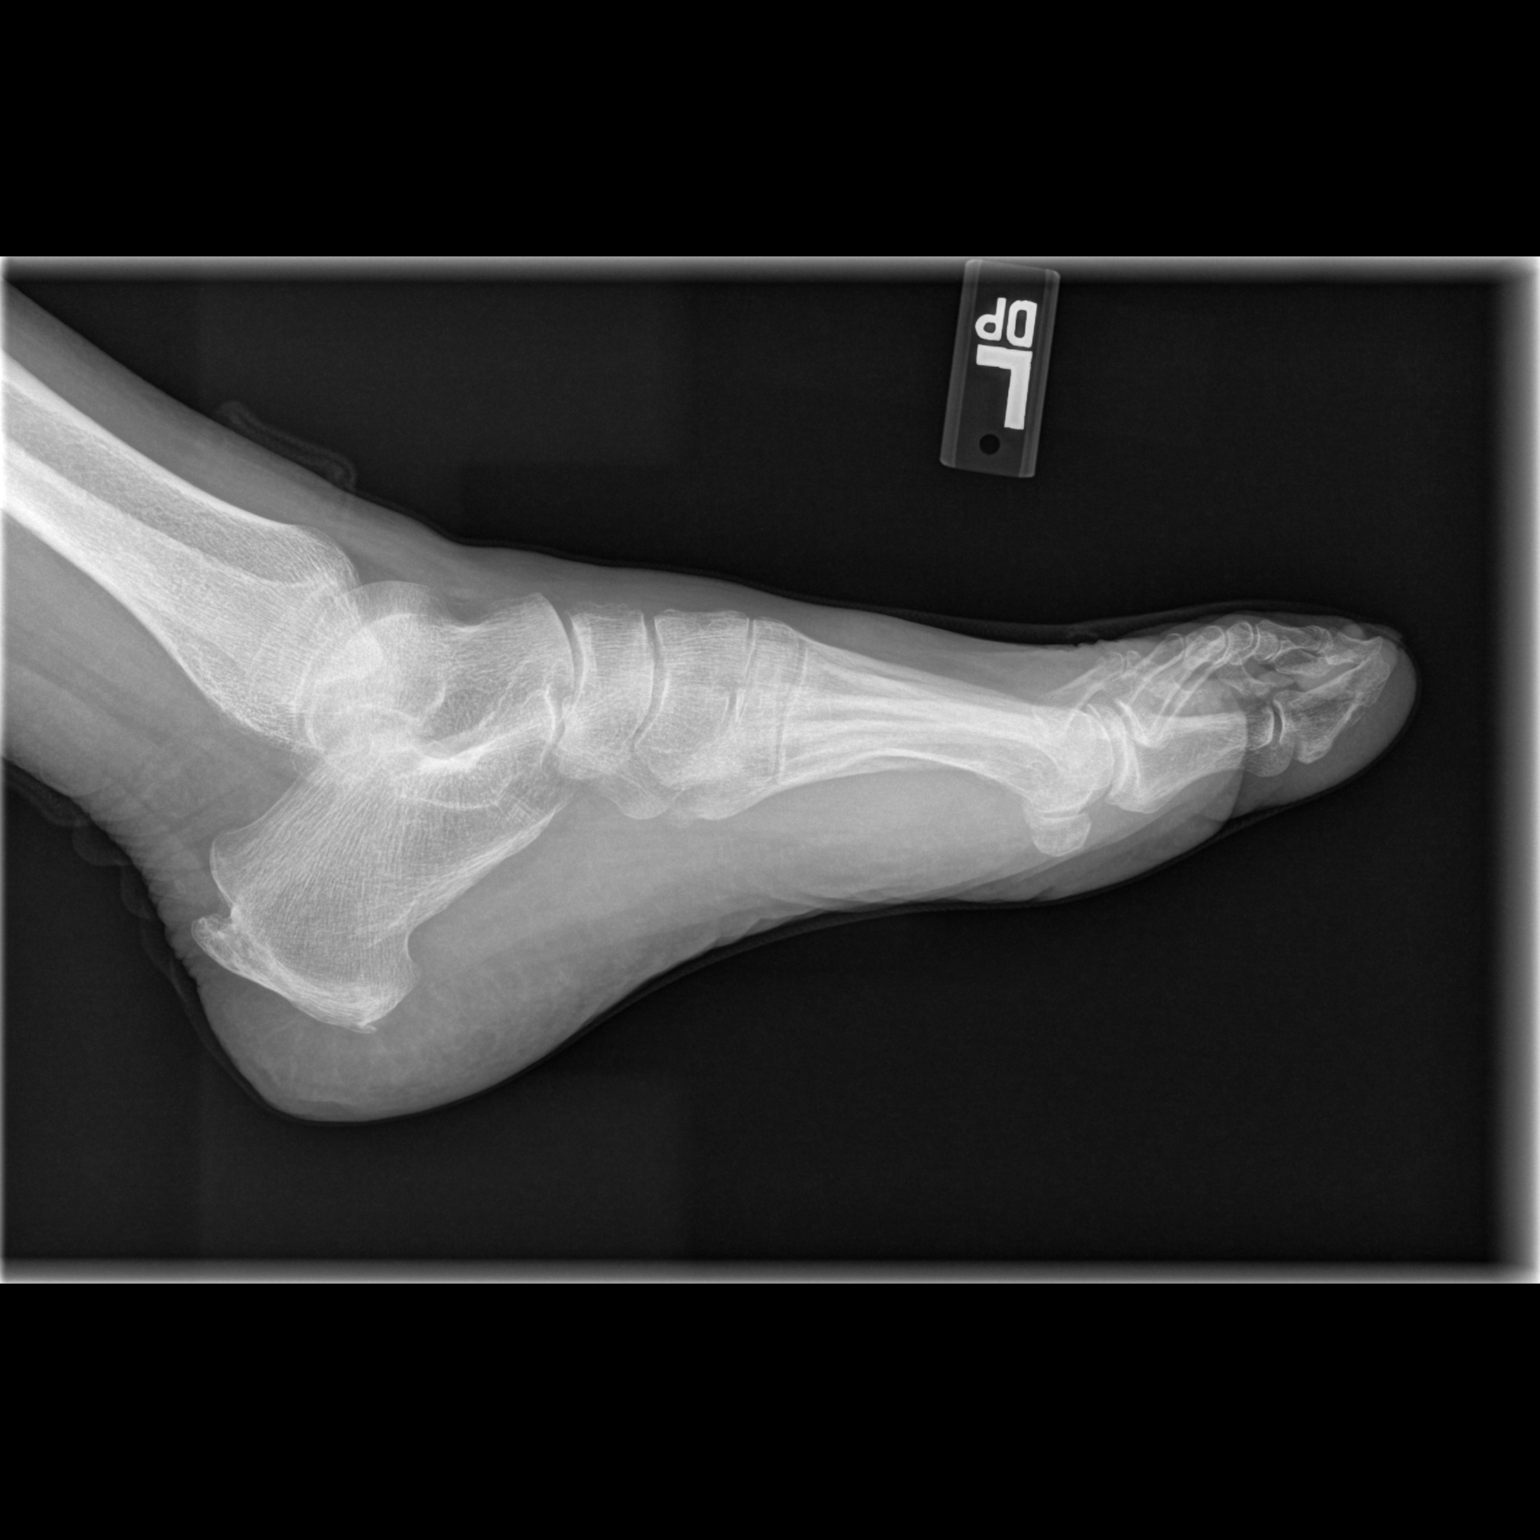

[2 of 2 positions shown; findings below may reference images not displayed]

FINDINGS: Frontal and lateral views were obtained. There is no fracture or
dislocation. There is narrowing of the first MTP joint as well as
narrowing of all PIP and DIP joints. There is spurring arising from
the posterior and to a lesser degree inferior calcaneus.
IMPRESSION: Osteoarthritic change in multiple distal joints. Calcaneal spurs. No
fracture or dislocation.

## 2014-02-11 ENCOUNTER — Other Ambulatory Visit: Payer: Self-pay | Admitting: Internal Medicine

## 2014-02-11 ENCOUNTER — Other Ambulatory Visit (HOSPITAL_COMMUNITY): Payer: Self-pay | Admitting: Respiratory Therapy

## 2014-02-11 DIAGNOSIS — R252 Cramp and spasm: Secondary | ICD-10-CM

## 2014-02-11 DIAGNOSIS — J441 Chronic obstructive pulmonary disease with (acute) exacerbation: Secondary | ICD-10-CM

## 2014-02-14 ENCOUNTER — Encounter (HOSPITAL_COMMUNITY): Payer: Medicare Other

## 2014-02-14 ENCOUNTER — Other Ambulatory Visit: Payer: Medicare Other

## 2014-04-09 ENCOUNTER — Ambulatory Visit
Admission: RE | Admit: 2014-04-09 | Discharge: 2014-04-09 | Disposition: A | Discharge status: Home / Self Care | Payer: Commercial Managed Care - HMO | Source: Ambulatory Visit | Attending: Internal Medicine | Admitting: Internal Medicine

## 2014-04-09 DIAGNOSIS — R252 Cramp and spasm: Secondary | ICD-10-CM

## 2014-04-11 ENCOUNTER — Ambulatory Visit
Admission: RE | Admit: 2014-04-11 | Discharge: 2014-04-11 | Disposition: A | Discharge status: Home / Self Care | Payer: Commercial Managed Care - HMO | Source: Ambulatory Visit | Attending: Internal Medicine | Admitting: Internal Medicine

## 2014-04-11 ENCOUNTER — Other Ambulatory Visit: Payer: Self-pay | Admitting: Internal Medicine

## 2014-04-11 DIAGNOSIS — M79672 Pain in left foot: Secondary | ICD-10-CM

## 2014-09-24 DIAGNOSIS — J209 Acute bronchitis, unspecified: Secondary | ICD-10-CM | POA: Diagnosis not present

## 2014-09-24 DIAGNOSIS — J329 Chronic sinusitis, unspecified: Secondary | ICD-10-CM | POA: Diagnosis not present

## 2014-10-22 DIAGNOSIS — J449 Chronic obstructive pulmonary disease, unspecified: Secondary | ICD-10-CM | POA: Diagnosis not present

## 2014-11-04 DIAGNOSIS — I10 Essential (primary) hypertension: Secondary | ICD-10-CM | POA: Diagnosis not present

## 2014-11-04 DIAGNOSIS — E782 Mixed hyperlipidemia: Secondary | ICD-10-CM | POA: Diagnosis not present

## 2014-11-04 DIAGNOSIS — J449 Chronic obstructive pulmonary disease, unspecified: Secondary | ICD-10-CM | POA: Diagnosis not present

## 2014-11-04 DIAGNOSIS — N183 Chronic kidney disease, stage 3 (moderate): Secondary | ICD-10-CM | POA: Diagnosis not present

## 2014-11-04 DIAGNOSIS — M810 Age-related osteoporosis without current pathological fracture: Secondary | ICD-10-CM | POA: Diagnosis not present

## 2014-11-04 DIAGNOSIS — Z0000 Encounter for general adult medical examination without abnormal findings: Secondary | ICD-10-CM | POA: Diagnosis not present

## 2014-11-04 DIAGNOSIS — F324 Major depressive disorder, single episode, in partial remission: Secondary | ICD-10-CM | POA: Diagnosis not present

## 2014-11-04 DIAGNOSIS — K3184 Gastroparesis: Secondary | ICD-10-CM | POA: Diagnosis not present

## 2014-11-11 DIAGNOSIS — G629 Polyneuropathy, unspecified: Secondary | ICD-10-CM | POA: Diagnosis not present

## 2014-11-11 DIAGNOSIS — M47816 Spondylosis without myelopathy or radiculopathy, lumbar region: Secondary | ICD-10-CM | POA: Diagnosis not present

## 2014-11-24 DIAGNOSIS — M5416 Radiculopathy, lumbar region: Secondary | ICD-10-CM | POA: Diagnosis not present

## 2014-11-24 DIAGNOSIS — M1990 Unspecified osteoarthritis, unspecified site: Secondary | ICD-10-CM | POA: Diagnosis not present

## 2014-12-11 DIAGNOSIS — M810 Age-related osteoporosis without current pathological fracture: Secondary | ICD-10-CM | POA: Diagnosis not present

## 2014-12-25 DIAGNOSIS — M5416 Radiculopathy, lumbar region: Secondary | ICD-10-CM | POA: Diagnosis not present

## 2014-12-25 DIAGNOSIS — M810 Age-related osteoporosis without current pathological fracture: Secondary | ICD-10-CM | POA: Diagnosis not present

## 2015-02-17 ENCOUNTER — Emergency Department (HOSPITAL_COMMUNITY)
Admission: EM | Admit: 2015-02-17 | Discharge: 2015-02-17 | Disposition: A | Discharge status: Home / Self Care | Payer: Commercial Managed Care - HMO | Attending: Emergency Medicine | Admitting: Emergency Medicine

## 2015-02-17 ENCOUNTER — Emergency Department (HOSPITAL_COMMUNITY): Payer: Commercial Managed Care - HMO

## 2015-02-17 ENCOUNTER — Encounter (HOSPITAL_COMMUNITY): Payer: Self-pay

## 2015-02-17 DIAGNOSIS — Z8542 Personal history of malignant neoplasm of other parts of uterus: Secondary | ICD-10-CM | POA: Insufficient documentation

## 2015-02-17 DIAGNOSIS — E785 Hyperlipidemia, unspecified: Secondary | ICD-10-CM | POA: Diagnosis not present

## 2015-02-17 DIAGNOSIS — F329 Major depressive disorder, single episode, unspecified: Secondary | ICD-10-CM | POA: Diagnosis not present

## 2015-02-17 DIAGNOSIS — M1990 Unspecified osteoarthritis, unspecified site: Secondary | ICD-10-CM | POA: Diagnosis not present

## 2015-02-17 DIAGNOSIS — Z720 Tobacco use: Secondary | ICD-10-CM | POA: Insufficient documentation

## 2015-02-17 DIAGNOSIS — J441 Chronic obstructive pulmonary disease with (acute) exacerbation: Secondary | ICD-10-CM | POA: Diagnosis not present

## 2015-02-17 DIAGNOSIS — K219 Gastro-esophageal reflux disease without esophagitis: Secondary | ICD-10-CM | POA: Diagnosis not present

## 2015-02-17 DIAGNOSIS — I10 Essential (primary) hypertension: Secondary | ICD-10-CM | POA: Diagnosis not present

## 2015-02-17 DIAGNOSIS — F419 Anxiety disorder, unspecified: Secondary | ICD-10-CM | POA: Diagnosis not present

## 2015-02-17 DIAGNOSIS — R05 Cough: Secondary | ICD-10-CM | POA: Diagnosis not present

## 2015-02-17 DIAGNOSIS — F17210 Nicotine dependence, cigarettes, uncomplicated: Secondary | ICD-10-CM | POA: Diagnosis not present

## 2015-02-17 DIAGNOSIS — R0602 Shortness of breath: Secondary | ICD-10-CM | POA: Diagnosis not present

## 2015-02-17 DIAGNOSIS — Z79899 Other long term (current) drug therapy: Secondary | ICD-10-CM | POA: Diagnosis not present

## 2015-02-17 LAB — BASIC METABOLIC PANEL
Anion gap: 7 (ref 5–15)
BUN: 11 mg/dL (ref 6–20)
CO2: 26 mmol/L (ref 22–32)
Calcium: 8.7 mg/dL — ABNORMAL LOW (ref 8.9–10.3)
Chloride: 104 mmol/L (ref 101–111)
Creatinine, Ser: 1.23 mg/dL — ABNORMAL HIGH (ref 0.44–1.00)
GFR calc Af Amer: 50 mL/min — ABNORMAL LOW (ref >60)
GFR calc non Af Amer: 43 mL/min — ABNORMAL LOW (ref >60)
Glucose, Bld: 89 mg/dL (ref 65–99)
Potassium: 3.9 mmol/L (ref 3.5–5.1)
Sodium: 137 mmol/L (ref 135–145)

## 2015-02-17 LAB — CBC WITH DIFFERENTIAL/PLATELET
Basophils Absolute: 0.1 10*3/uL (ref 0.0–0.1)
Basophils Relative: 1 % (ref 0–1)
Eosinophils Absolute: 0.5 10*3/uL (ref 0.0–0.7)
Eosinophils Relative: 6 % — ABNORMAL HIGH (ref 0–5)
HCT: 37.5 % (ref 36.0–46.0)
Hemoglobin: 12.2 g/dL (ref 12.0–15.0)
Lymphocytes Relative: 23 % (ref 12–46)
Lymphs Abs: 1.8 10*3/uL (ref 0.7–4.0)
MCH: 29.7 pg (ref 26.0–34.0)
MCHC: 32.5 g/dL (ref 30.0–36.0)
MCV: 91.2 fL (ref 78.0–100.0)
Monocytes Absolute: 0.6 10*3/uL (ref 0.1–1.0)
Monocytes Relative: 8 % (ref 3–12)
Neutro Abs: 4.8 10*3/uL (ref 1.7–7.7)
Neutrophils Relative %: 62 % (ref 43–77)
Platelets: 229 10*3/uL (ref 150–400)
RBC: 4.11 MIL/uL (ref 3.87–5.11)
RDW: 13.7 % (ref 11.5–15.5)
WBC: 7.7 10*3/uL (ref 4.0–10.5)

## 2015-02-17 IMAGING — DX DG CHEST 2V
2 series · 2 of 2 positions shown · non-contrast
Comparison: [DATE]

CLINICAL DATA: Shortness of breath ; cough

EXAM:
CHEST  2 VIEW

[w chest pa]
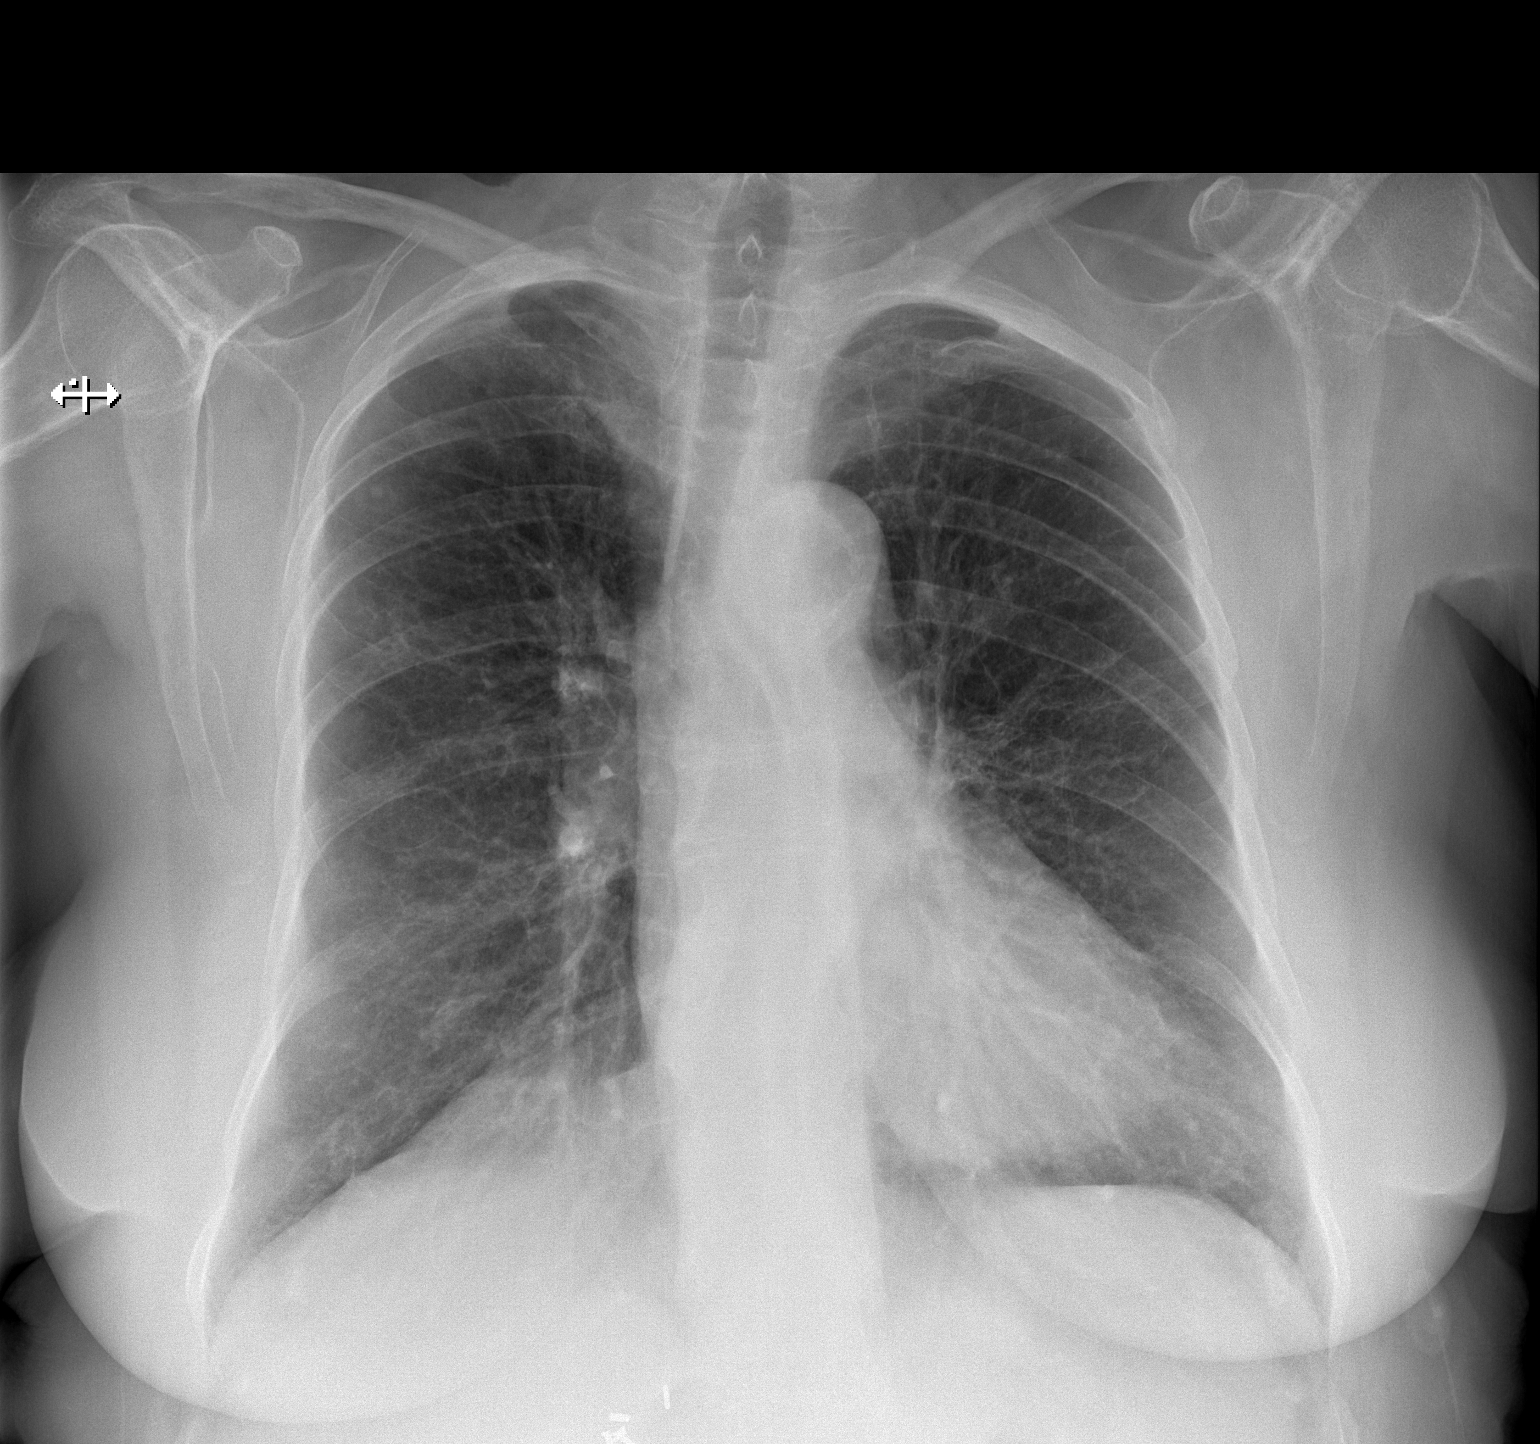

[w chest lat]
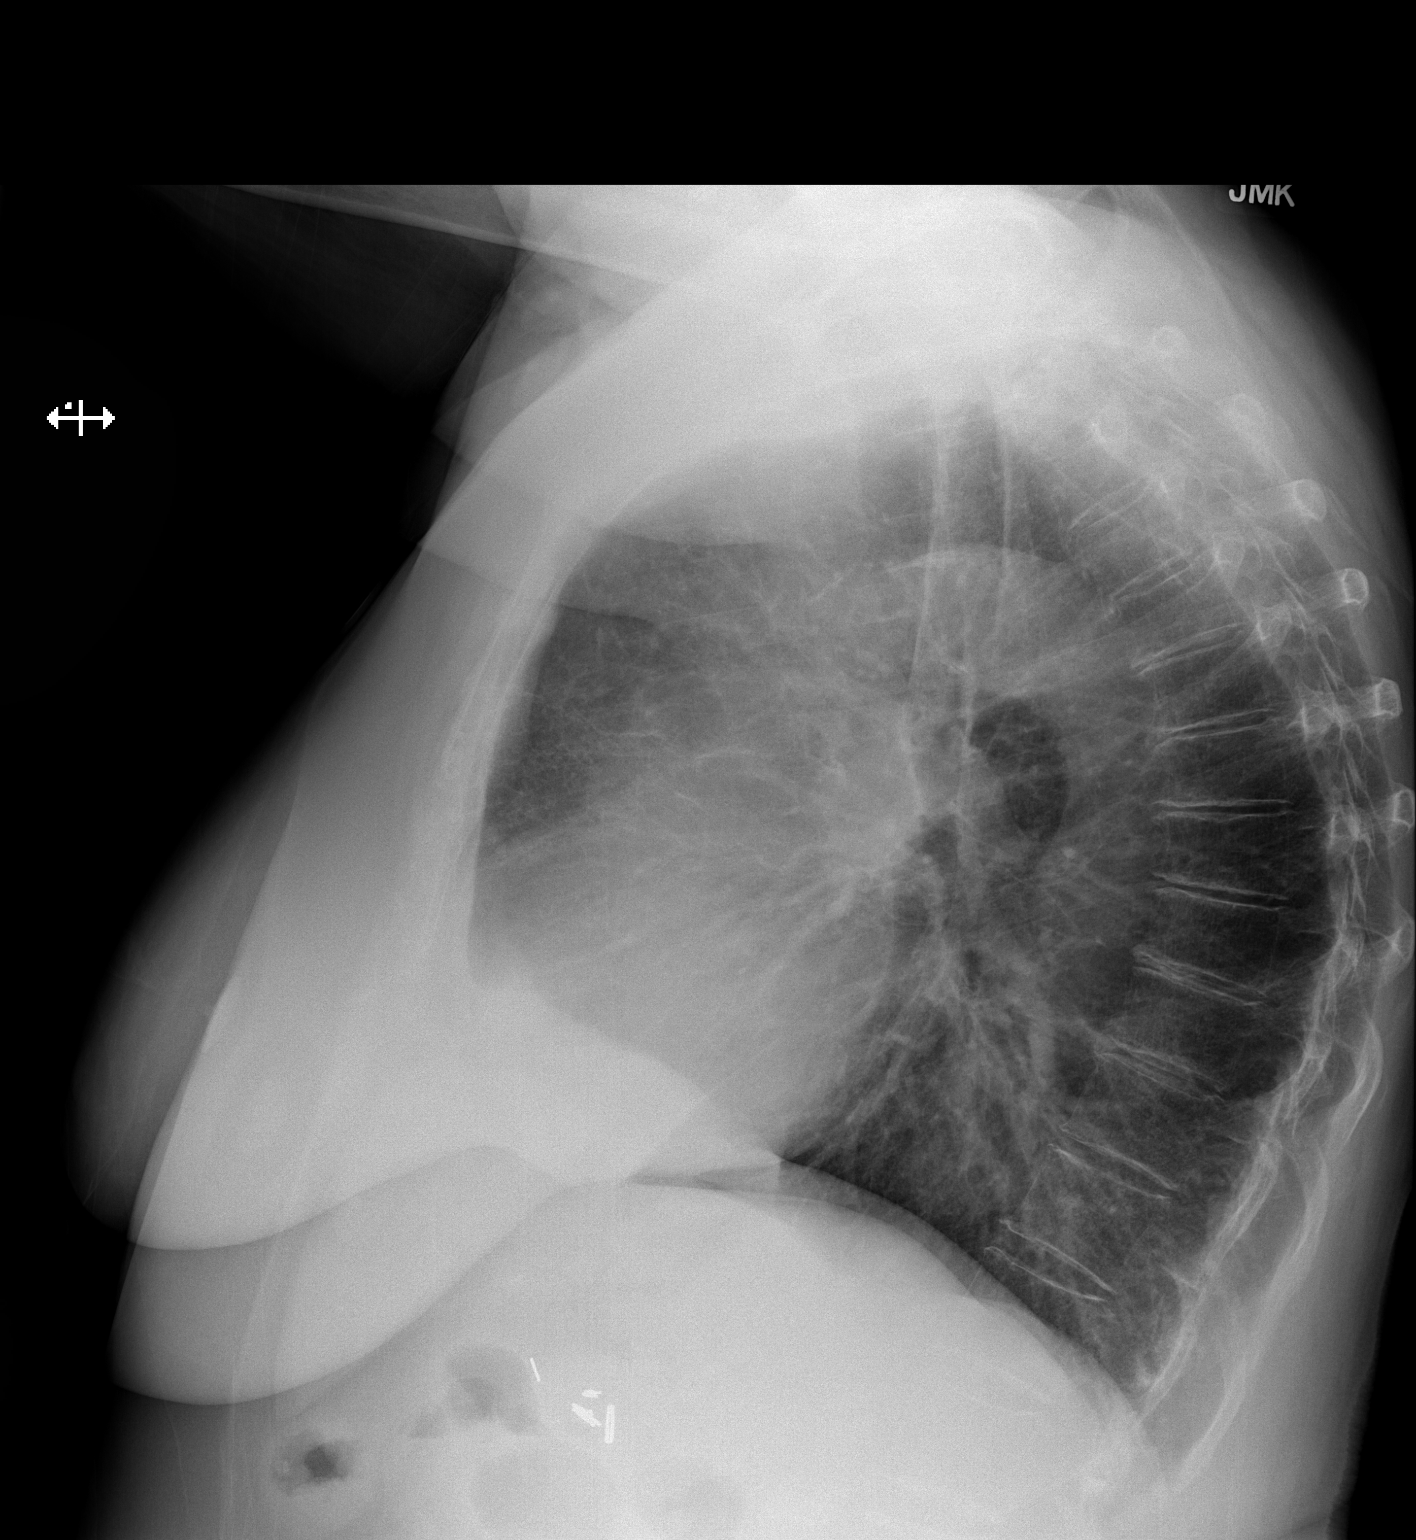

[2 of 2 positions shown; findings below may reference images not displayed]

FINDINGS: Lungs are mildly hyperexpanded but clear. There is slight scarring
in the left mid lung. There is no edema or consolidation. Heart size
and pulmonary vascularity are normal. No adenopathy. There is
degenerative change in the thoracic spine.
IMPRESSION: No edema or consolidation. Lungs mildly hyperexpanded. Mild scarring
left mid lung.

## 2015-02-17 MED ORDER — ALBUTEROL SULFATE (2.5 MG/3ML) 0.083% IN NEBU
5.0000 mg | INHALATION_SOLUTION | Freq: Once | RESPIRATORY_TRACT | Status: AC
  Administered 2015-02-17: 5 mg via RESPIRATORY_TRACT
  Filled 2015-02-17: qty 6

## 2015-02-17 MED ORDER — ALBUTEROL SULFATE HFA 108 (90 BASE) MCG/ACT IN AERS
2.0000 | INHALATION_SPRAY | Freq: Once | RESPIRATORY_TRACT | Status: AC
  Administered 2015-02-17: 2 via RESPIRATORY_TRACT
  Filled 2015-02-17: qty 6.7

## 2015-02-17 MED ORDER — ALBUTEROL (5 MG/ML) CONTINUOUS INHALATION SOLN: 10.0000 mg/h | INHALATION_SOLUTION | Freq: Once | RESPIRATORY_TRACT | Status: DC

## 2015-02-17 MED ORDER — IPRATROPIUM-ALBUTEROL 0.5-2.5 (3) MG/3ML IN SOLN
3.0000 mL | Freq: Once | RESPIRATORY_TRACT | Status: AC
  Administered 2015-02-17: 3 mL via RESPIRATORY_TRACT
  Filled 2015-02-17: qty 3

## 2015-02-17 MED ORDER — METHYLPREDNISOLONE SODIUM SUCC 125 MG IJ SOLR
125.0000 mg | Freq: Once | INTRAMUSCULAR | Status: AC
  Administered 2015-02-17: 125 mg via INTRAVENOUS
  Filled 2015-02-17: qty 2

## 2015-02-17 MED ORDER — PREDNISONE 20 MG PO TABS: ORAL_TABLET | ORAL | Status: DC

## 2015-02-17 NOTE — Progress Notes (Signed)
LCSW called for assistance with medications. Patient left prior to being seen for assistance. No SW interventions needed at this time.  Lane Hacker, MSW Clinical Social Work: Emergency Room [phone]

## 2015-02-17 NOTE — ED Provider Notes (Signed)
CSN: [id number]     Arrival date & time 02/17/15  4431 History   First MD Initiated Contact with Patient 02/17/15 1012     Chief Complaint  Patient presents with  . Shortness of Breath     (Consider location/radiation/quality/duration/timing/severity/associated sxs/prior Treatment) HPI Comments: 74 year old female with history of COPD chronic smoking, sebaceous cyst presents with wheezing cough mild soreness of breath since the weekend. Similar to previous COPD history. No focal leg swelling, no blood thinners. Patient had a blood clot in the leg after major surgery in the past denies active cancer treatment, recent surgery, unilateral leg swelling, no estrogen use, no pleuritic pain. Patient out of her COPD medicines. No pleuritic pain, no chest pain. Mild productive cough.  Patient is a 74 y.o. female presenting with shortness of breath. The history is provided by the patient.  Shortness of Breath Associated symptoms: cough   Associated symptoms: no abdominal pain, no chest pain, no fever, no headaches, no neck pain, no rash and no vomiting     Past Medical History  Diagnosis Date  . Hypertension   . GERD (gastroesophageal reflux disease)   . Anxiety   . Depression   . COPD (chronic obstructive pulmonary disease)   . Low back pain   . Lumbar back pain   . Osteoporosis   . Osteoarthritis   . Hyperlipidemia   . Cyst     left side of neck  . Cancer     uterine   Past Surgical History  Procedure Laterality Date  . Cholecystectomy    . Appendectomy    . Total abdominal hysterectomy    . Back surgery    . Knee surgery      Right knee  . Hip surgery  01/2010    Left Hip   . Mass excision  08/25/2011    Procedure: EXCISION MASS;  Surgeon: Harl Bowie, MD;  Location: [place];  Service: General;  Laterality: N/A;  excision left neck mass   Family History  Problem Relation Age of Onset  . Heart disease Mother   . Heart disease Father   . Cancer Sister      breast   History  Substance Use Topics  . Smoking status: Current Every Day Smoker -- 2.00 packs/day    Types: Cigarettes  . Smokeless tobacco: Never Used  . Alcohol Use: No   OB History    No data available     Review of Systems  Constitutional: Negative for fever and chills.  HENT: Positive for congestion.   Eyes: Negative for visual disturbance.  Respiratory: Positive for cough and shortness of breath.   Cardiovascular: Negative for chest pain and leg swelling.  Gastrointestinal: Negative for vomiting and abdominal pain.  Genitourinary: Negative for dysuria and flank pain.  Musculoskeletal: Negative for back pain, neck pain and neck stiffness.  Skin: Negative for rash.  Neurological: Negative for light-headedness and headaches.      Allergies  Boniva; Codeine sulfate; and Nitrofurantoin monohyd macro  Home Medications   Prior to Admission medications   Medication Sig Start Date End Date Taking? Authorizing Provider  albuterol (PROVENTIL HFA;VENTOLIN HFA) 108 (90 BASE) MCG/ACT inhaler Inhale 2 puffs into the lungs every 6 (six) hours as needed.     Yes Historical Provider, MD  albuterol (PROVENTIL) (2.5 MG/3ML) 0.083% nebulizer solution Take 2.5 mg by nebulization every 6 (six) hours as needed for wheezing or shortness of breath.   Yes Historical Provider, MD  amLODipine (NORVASC) 2.5 MG tablet Take 5 mg by mouth daily.   Yes Historical Provider, MD  atorvastatin (LIPITOR) 20 MG tablet Take 20 mg by mouth at bedtime.   Yes Historical Provider, MD  diazepam (VALIUM) 5 MG tablet Take 2.5 mg by mouth at bedtime as needed for anxiety (for nerves).    Yes Historical Provider, MD  gabapentin (NEURONTIN) 100 MG capsule Take 100 mg by mouth 3 (three) times daily.   Yes Historical Provider, MD  HYDROcodone-acetaminophen (NORCO) 5-325 MG per tablet Take 1 tablet by mouth 2 (two) times daily as needed for moderate pain.    Yes Historical Provider, MD  lisinopril  (PRINIVIL,ZESTRIL) 30 MG tablet Take 30 mg by mouth every morning.   Yes Historical Provider, MD  metoCLOPramide (REGLAN) 5 MG tablet Take 5 mg by mouth 2 (two) times daily.    Yes Historical Provider, MD  Multiple Vitamins-Minerals (MULTIVITAMIN WITH MINERALS) tablet Take 1 tablet by mouth daily.     Yes Historical Provider, MD  predniSONE (DELTASONE) 20 MG tablet 3 tabs po day one, then 2 tabs daily x 4 days 02/17/15   Elnora Morrison, MD  raloxifene (EVISTA) 60 MG tablet Take 60 mg by mouth daily.   Yes Historical Provider, MD  sertraline (ZOLOFT) 100 MG tablet Take 100 mg by mouth daily.   Yes Historical Provider, MD  tiotropium (SPIRIVA) 18 MCG inhalation capsule Place 18 mcg into inhaler and inhale daily.     Yes Historical Provider, MD   BP 114/89 mmHg  Pulse 85  Temp(Src) 98 F (36.7 C) (Oral)  Resp 21  SpO2 97% Physical Exam  Constitutional: She is oriented to person, place, and time. She appears well-developed and well-nourished.  HENT:  Head: Normocephalic and atraumatic.  Eyes: Conjunctivae are normal. Right eye exhibits no discharge. Left eye exhibits no discharge.  Neck: Normal range of motion. Neck supple. No tracheal deviation present.  Cardiovascular: Normal rate and regular rhythm.   Pulmonary/Chest: Effort normal. She has wheezes (end expiratory bilateral, no respiratory distress, prolonged expiratory phase).  Abdominal: Soft. She exhibits no distension. There is no tenderness. There is no guarding.  Musculoskeletal: She exhibits no edema or tenderness.  Neurological: She is alert and oriented to person, place, and time.  Skin: Skin is warm. No rash noted.  Psychiatric: She has a normal mood and affect.  Nursing note and vitals reviewed.   ED Course  Procedures (including critical care time) Labs Review Labs Reviewed  CBC WITH DIFFERENTIAL/PLATELET - Abnormal; Notable for the following:    Eosinophils Relative 6 (*)    All other components within normal limits   BASIC METABOLIC PANEL - Abnormal; Notable for the following:    Creatinine, Ser 1.23 (*)    Calcium 8.7 (*)    GFR calc non Af Amer 43 (*)    GFR calc Af Amer 50 (*)    All other components within normal limits    Imaging Review Dg Chest 2 View  02/17/2015   CLINICAL DATA:  Shortness of breath ; cough  EXAM: CHEST  2 VIEW  COMPARISON:  March 22, 2011  FINDINGS: Lungs are mildly hyperexpanded but clear. There is slight scarring in the left mid lung. There is no edema or consolidation. Heart size and pulmonary vascularity are normal. No adenopathy. There is degenerative change in the thoracic spine.  IMPRESSION: No edema or consolidation. Lungs mildly hyperexpanded. Mild scarring left mid lung.   Electronically Signed   By: Lowella Grip  III M.D.   On: 02/17/2015 10:57     EKG Interpretation   Date/Time:  Tuesday February 17 2015 09:56:19 EDT Ventricular Rate:  86 PR Interval:  166 QRS Duration: 80 QT Interval:  397 QTC Calculation: 475 R Axis:   67 Text Interpretation:  Sinus rhythm Paired ventricular premature complexes  Aberrant conduction of SV complex(es) poor baseline Confirmed by ZAVITZ   MD, JOSHUA ([id number]) on 02/17/2015 10:13:37 AM      MDM   Final diagnoses:  Acute exacerbation of chronic obstructive pulmonary disease (COPD)   Patient presents with clinically COPD exacerbation. No classic blood clot risk factors, patient had a blood clot associated major surgery in the past. Patient improved significantly after 1 nebulizer and is asking to go home. I had a discussion that we had a low concern for blood clot however unable to rule it out with a CT scan. Patient was comfortable following up outpatient and will return for worsening symptoms.  Steroid-induced nebulizers, albuterol.  Results and differential diagnosis were discussed with the patient/parent/guardian. Xrays were independently reviewed by myself.  Close follow up outpatient was discussed, comfortable with the  plan.   Medications  albuterol (PROVENTIL,VENTOLIN) solution continuous neb (not administered)  albuterol (PROVENTIL HFA;VENTOLIN HFA) 108 (90 BASE) MCG/ACT inhaler 2 puff (not administered)  albuterol (PROVENTIL) (2.5 MG/3ML) 0.083% nebulizer solution 5 mg (5 mg Nebulization Given 02/17/15 1013)  methylPREDNISolone sodium succinate (SOLU-MEDROL) 125 mg/2 mL injection 125 mg (125 mg Intravenous Given 02/17/15 1058)  ipratropium-albuterol (DUONEB) 0.5-2.5 (3) MG/3ML nebulizer solution 3 mL (3 mLs Nebulization Given 02/17/15 1100)    Filed Vitals:   02/17/15 1009 02/17/15 1015 02/17/15 1030 02/17/15 1103  BP:  135/60 114/89   Pulse:  75 85   Temp: 98 F (36.7 C)     TempSrc: Oral     Resp:  20 21   SpO2:  96% 92% 97%    Final diagnoses:  Acute exacerbation of chronic obstructive pulmonary disease (COPD)        Elnora Morrison, MD 02/17/15 1140

## 2015-02-17 NOTE — Discharge Instructions (Signed)
If you were given medicines take as directed.  If you are on coumadin or contraceptives realize their levels and effectiveness is altered by many different medicines.  If you have any reaction (rash, tongues swelling, other) to the medicines stop taking and see a physician.    If your blood pressure was elevated in the ER make sure you follow up for management with a primary doctor or return for chest pain, shortness of breath or stroke symptoms.  Please follow up as directed and return to the ER or see a physician for new or worsening symptoms.  Thank you. Filed Vitals:   02/17/15 1009 02/17/15 1015 02/17/15 1030 02/17/15 1103  BP:  135/60 114/89   Pulse:  75 85   Temp: 98 F (36.7 C)     TempSrc: Oral     Resp:  20 21   SpO2:  96% 92% 97%

## 2015-02-17 NOTE — Progress Notes (Signed)
Pt does not want continuous neb, [facility] w/ fine crackles, rare end exp. Wheeze noted.  MD aware, will hold off on admin. cont. neb for now- per MD.  Pt denies SOB. VSS, no distress noted.

## 2015-02-17 NOTE — ED Notes (Addendum)
Pt c/o more shortness of breath than usual. Pt has been using inhaler with some relief. Pt only has chest pain with coughing. Hx COPD.

## 2015-02-19 DIAGNOSIS — J449 Chronic obstructive pulmonary disease, unspecified: Secondary | ICD-10-CM | POA: Diagnosis not present

## 2015-03-02 DIAGNOSIS — L989 Disorder of the skin and subcutaneous tissue, unspecified: Secondary | ICD-10-CM | POA: Diagnosis not present

## 2015-03-05 DIAGNOSIS — J441 Chronic obstructive pulmonary disease with (acute) exacerbation: Secondary | ICD-10-CM | POA: Diagnosis not present

## 2015-03-31 DIAGNOSIS — E780 Pure hypercholesterolemia: Secondary | ICD-10-CM | POA: Diagnosis not present

## 2015-03-31 DIAGNOSIS — L729 Follicular cyst of the skin and subcutaneous tissue, unspecified: Secondary | ICD-10-CM | POA: Diagnosis not present

## 2015-03-31 DIAGNOSIS — I10 Essential (primary) hypertension: Secondary | ICD-10-CM | POA: Diagnosis not present

## 2015-03-31 DIAGNOSIS — F322 Major depressive disorder, single episode, severe without psychotic features: Secondary | ICD-10-CM | POA: Diagnosis not present

## 2015-03-31 DIAGNOSIS — J449 Chronic obstructive pulmonary disease, unspecified: Secondary | ICD-10-CM | POA: Diagnosis not present

## 2015-03-31 DIAGNOSIS — N183 Chronic kidney disease, stage 3 (moderate): Secondary | ICD-10-CM | POA: Diagnosis not present

## 2015-03-31 DIAGNOSIS — Z23 Encounter for immunization: Secondary | ICD-10-CM | POA: Diagnosis not present

## 2015-04-27 ENCOUNTER — Inpatient Hospital Stay (HOSPITAL_COMMUNITY)
Admission: EM | Admit: 2015-04-27 | Discharge: 2015-04-28 | DRG: 190 | Disposition: A | Discharge status: Home Health | Payer: Commercial Managed Care - HMO | Attending: Internal Medicine | Admitting: Internal Medicine

## 2015-04-27 ENCOUNTER — Emergency Department (HOSPITAL_COMMUNITY): Payer: Commercial Managed Care - HMO

## 2015-04-27 ENCOUNTER — Encounter (HOSPITAL_COMMUNITY): Payer: Self-pay | Admitting: Emergency Medicine

## 2015-04-27 DIAGNOSIS — E785 Hyperlipidemia, unspecified: Secondary | ICD-10-CM | POA: Diagnosis present

## 2015-04-27 DIAGNOSIS — Z79891 Long term (current) use of opiate analgesic: Secondary | ICD-10-CM

## 2015-04-27 DIAGNOSIS — M545 Low back pain: Secondary | ICD-10-CM | POA: Diagnosis not present

## 2015-04-27 DIAGNOSIS — Z8542 Personal history of malignant neoplasm of other parts of uterus: Secondary | ICD-10-CM

## 2015-04-27 DIAGNOSIS — R0902 Hypoxemia: Secondary | ICD-10-CM | POA: Diagnosis present

## 2015-04-27 DIAGNOSIS — M1990 Unspecified osteoarthritis, unspecified site: Secondary | ICD-10-CM | POA: Diagnosis present

## 2015-04-27 DIAGNOSIS — K219 Gastro-esophageal reflux disease without esophagitis: Secondary | ICD-10-CM | POA: Diagnosis present

## 2015-04-27 DIAGNOSIS — J9621 Acute and chronic respiratory failure with hypoxia: Secondary | ICD-10-CM | POA: Diagnosis present

## 2015-04-27 DIAGNOSIS — Z9049 Acquired absence of other specified parts of digestive tract: Secondary | ICD-10-CM | POA: Diagnosis not present

## 2015-04-27 DIAGNOSIS — J441 Chronic obstructive pulmonary disease with (acute) exacerbation: Secondary | ICD-10-CM | POA: Diagnosis not present

## 2015-04-27 DIAGNOSIS — Z79899 Other long term (current) drug therapy: Secondary | ICD-10-CM | POA: Diagnosis not present

## 2015-04-27 DIAGNOSIS — E876 Hypokalemia: Secondary | ICD-10-CM | POA: Diagnosis present

## 2015-04-27 DIAGNOSIS — J9601 Acute respiratory failure with hypoxia: Secondary | ICD-10-CM

## 2015-04-27 DIAGNOSIS — F329 Major depressive disorder, single episode, unspecified: Secondary | ICD-10-CM | POA: Diagnosis present

## 2015-04-27 DIAGNOSIS — J449 Chronic obstructive pulmonary disease, unspecified: Secondary | ICD-10-CM | POA: Diagnosis not present

## 2015-04-27 DIAGNOSIS — F419 Anxiety disorder, unspecified: Secondary | ICD-10-CM | POA: Diagnosis not present

## 2015-04-27 DIAGNOSIS — J439 Emphysema, unspecified: Secondary | ICD-10-CM

## 2015-04-27 DIAGNOSIS — J189 Pneumonia, unspecified organism: Secondary | ICD-10-CM | POA: Diagnosis present

## 2015-04-27 DIAGNOSIS — M810 Age-related osteoporosis without current pathological fracture: Secondary | ICD-10-CM | POA: Diagnosis present

## 2015-04-27 DIAGNOSIS — J9600 Acute respiratory failure, unspecified whether with hypoxia or hypercapnia: Secondary | ICD-10-CM | POA: Diagnosis present

## 2015-04-27 DIAGNOSIS — I10 Essential (primary) hypertension: Secondary | ICD-10-CM | POA: Diagnosis not present

## 2015-04-27 DIAGNOSIS — Z885 Allergy status to narcotic agent status: Secondary | ICD-10-CM | POA: Diagnosis not present

## 2015-04-27 DIAGNOSIS — R0602 Shortness of breath: Secondary | ICD-10-CM | POA: Diagnosis not present

## 2015-04-27 DIAGNOSIS — F17210 Nicotine dependence, cigarettes, uncomplicated: Secondary | ICD-10-CM | POA: Diagnosis not present

## 2015-04-27 DIAGNOSIS — Z888 Allergy status to other drugs, medicaments and biological substances status: Secondary | ICD-10-CM | POA: Diagnosis not present

## 2015-04-27 DIAGNOSIS — R069 Unspecified abnormalities of breathing: Secondary | ICD-10-CM | POA: Diagnosis not present

## 2015-04-27 DIAGNOSIS — I517 Cardiomegaly: Secondary | ICD-10-CM | POA: Diagnosis present

## 2015-04-27 DIAGNOSIS — R5381 Other malaise: Secondary | ICD-10-CM | POA: Diagnosis not present

## 2015-04-27 DIAGNOSIS — J9811 Atelectasis: Secondary | ICD-10-CM | POA: Diagnosis not present

## 2015-04-27 HISTORY — DX: Acute and chronic respiratory failure with hypoxia: J96.21

## 2015-04-27 LAB — BLOOD GAS, ARTERIAL
Acid-base deficit: 0.4 mmol/L (ref 0.0–2.0)
Bicarbonate: 24.5 mEq/L — ABNORMAL HIGH (ref 20.0–24.0)
Drawn by: 422461
O2 Content: 8 L/min
O2 Saturation: 97.8 %
Patient temperature: 98.6
TCO2: 22.1 mmol/L (ref 0–100)
pCO2 arterial: 43.9 mmHg (ref 35.0–45.0)
pH, Arterial: 7.366 (ref 7.350–7.450)
pO2, Arterial: 112 mmHg — ABNORMAL HIGH (ref 80.0–100.0)

## 2015-04-27 LAB — URINALYSIS, ROUTINE W REFLEX MICROSCOPIC
Bilirubin Urine: NEGATIVE
Glucose, UA: NEGATIVE mg/dL
Ketones, ur: NEGATIVE mg/dL
Leukocytes, UA: NEGATIVE
Nitrite: NEGATIVE
Protein, ur: NEGATIVE mg/dL
Specific Gravity, Urine: 1.007 (ref 1.005–1.030)
Urobilinogen, UA: 0.2 mg/dL (ref 0.0–1.0)
pH: 5.5 (ref 5.0–8.0)

## 2015-04-27 LAB — I-STAT CHEM 8, ED
BUN: 9 mg/dL (ref 6–20)
Calcium, Ion: 1.11 mmol/L — ABNORMAL LOW (ref 1.13–1.30)
Chloride: 104 mmol/L (ref 101–111)
Creatinine, Ser: 1 mg/dL (ref 0.44–1.00)
Glucose, Bld: 154 mg/dL — ABNORMAL HIGH (ref 65–99)
HCT: 40 % (ref 36.0–46.0)
Hemoglobin: 13.6 g/dL (ref 12.0–15.0)
Potassium: 3 mmol/L — ABNORMAL LOW (ref 3.5–5.1)
Sodium: 142 mmol/L (ref 135–145)
TCO2: 26 mmol/L (ref 0–100)

## 2015-04-27 LAB — CBC WITH DIFFERENTIAL/PLATELET
Basophils Absolute: 0 10*3/uL (ref 0.0–0.1)
Basophils Relative: 1 %
Eosinophils Absolute: 0.4 10*3/uL (ref 0.0–0.7)
Eosinophils Relative: 6 %
HCT: 38.3 % (ref 36.0–46.0)
Hemoglobin: 12.7 g/dL (ref 12.0–15.0)
Lymphocytes Relative: 19 %
Lymphs Abs: 1.4 10*3/uL (ref 0.7–4.0)
MCH: 29.7 pg (ref 26.0–34.0)
MCHC: 33.2 g/dL (ref 30.0–36.0)
MCV: 89.7 fL (ref 78.0–100.0)
Monocytes Absolute: 0.3 10*3/uL (ref 0.1–1.0)
Monocytes Relative: 5 %
Neutro Abs: 5.3 10*3/uL (ref 1.7–7.7)
Neutrophils Relative %: 71 %
Platelets: 217 10*3/uL (ref 150–400)
RBC: 4.27 MIL/uL (ref 3.87–5.11)
RDW: 14.4 % (ref 11.5–15.5)
WBC: 7.6 10*3/uL (ref 4.0–10.5)

## 2015-04-27 LAB — MRSA PCR SCREENING: MRSA by PCR: NEGATIVE

## 2015-04-27 LAB — I-STAT TROPONIN, ED: Troponin i, poc: 0.01 ng/mL (ref 0.00–0.08)

## 2015-04-27 LAB — URINE MICROSCOPIC-ADD ON

## 2015-04-27 LAB — BRAIN NATRIURETIC PEPTIDE: B Natriuretic Peptide: 48.4 pg/mL (ref 0.0–100.0)

## 2015-04-27 IMAGING — CR DG CHEST 1V PORT
1 series · 2 of 2 positions shown · non-contrast
Comparison: Chest radiograph performed [DATE]

CLINICAL DATA: Acute onset of shortness of breath and wheezing.
Initial encounter.

EXAM:
PORTABLE CHEST 1 VIEW

[Series 1: AP · U · 2 of 2 slices shown]
[im 1/2]
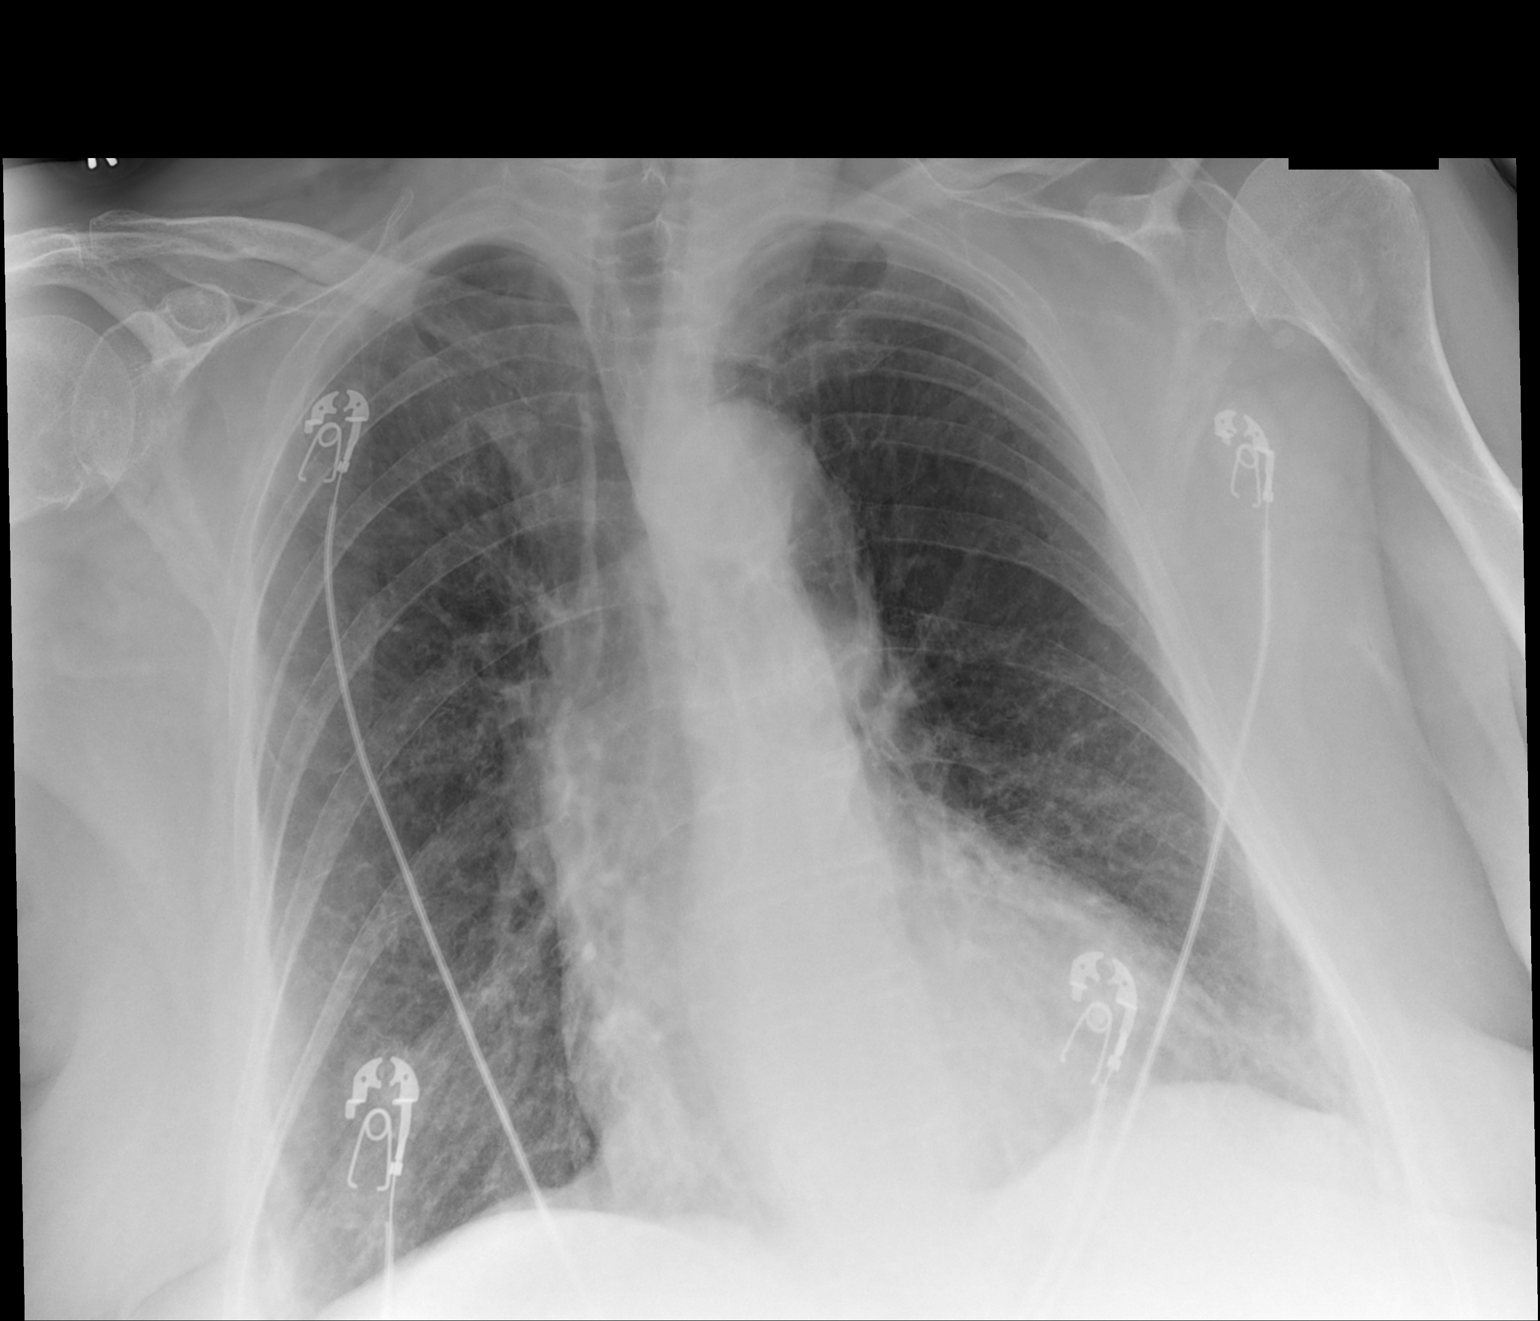
[im 2/2]
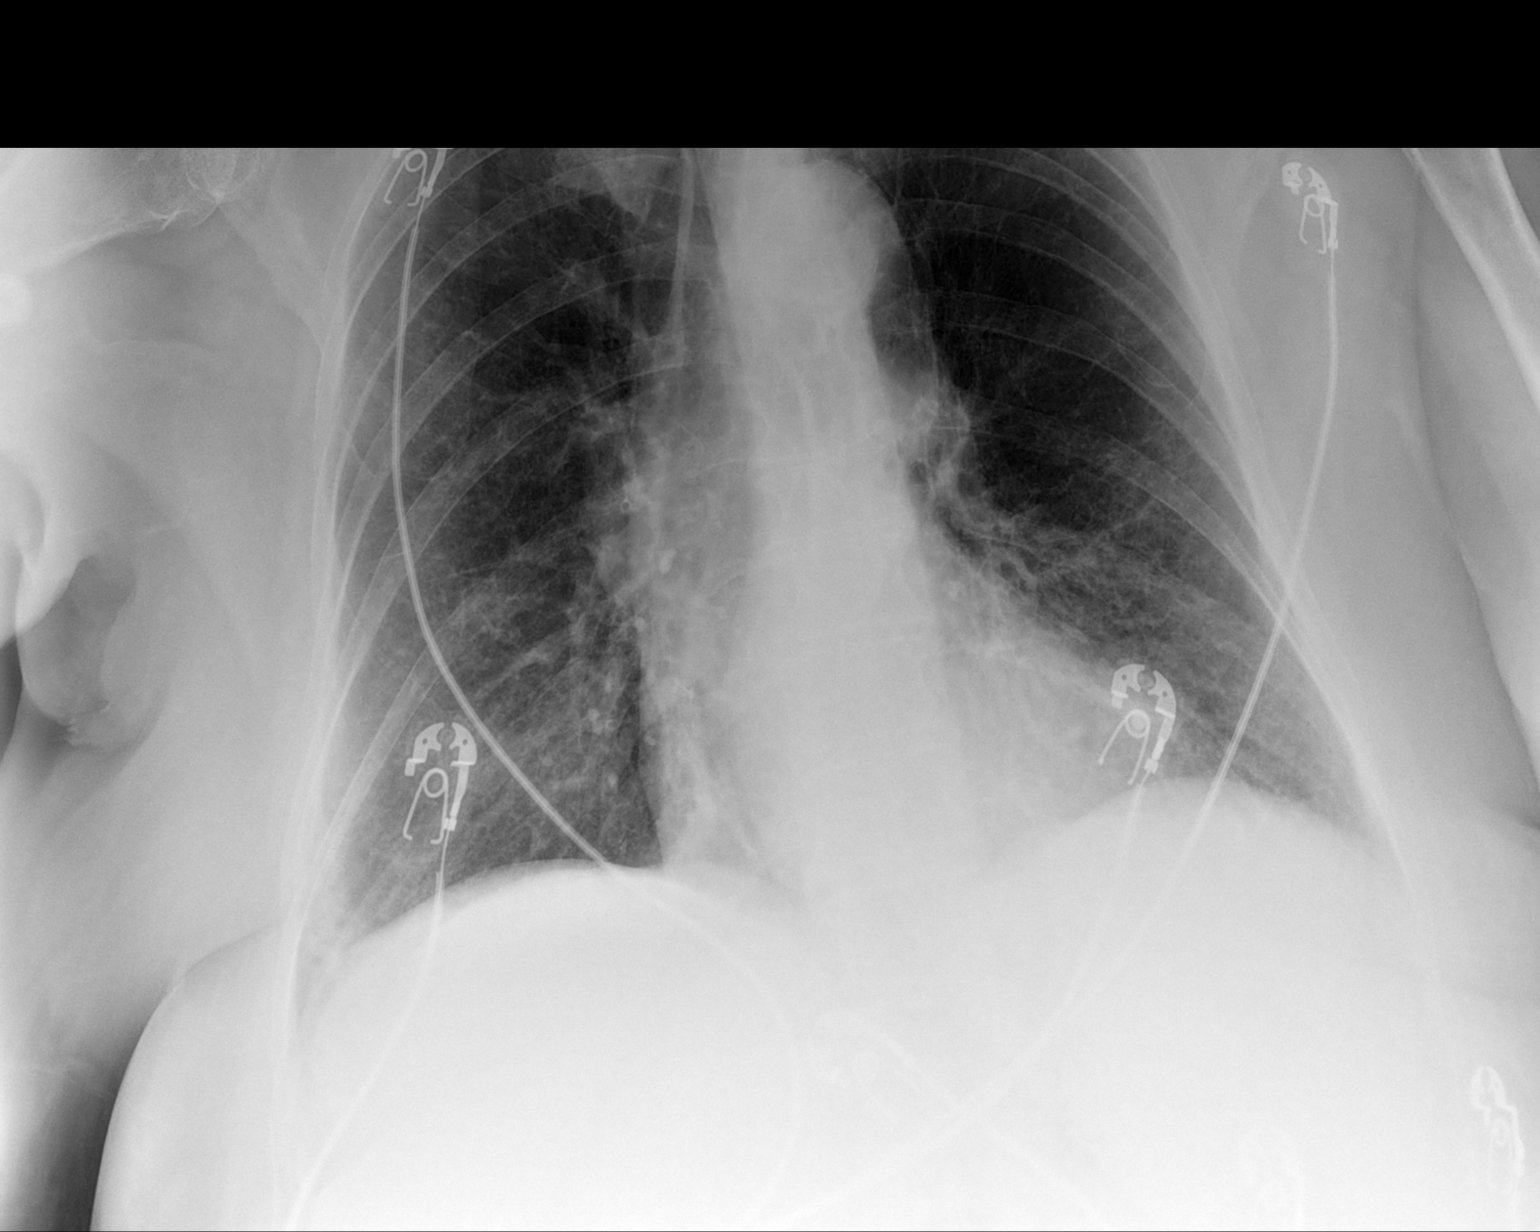

[2 of 2 positions shown; findings below may reference images not displayed]

FINDINGS: The lungs are well-aerated. Mild bibasilar atelectasis is noted.
Chronically increased interstitial markings are seen. There is no
evidence of pleural effusion or pneumothorax.

The cardiomediastinal silhouette is mildly enlarged. No acute
osseous abnormalities are seen.
IMPRESSION: Mild bibasilar atelectasis noted. Chronically increased interstitial
markings seen. Mild cardiomegaly.

## 2015-04-27 MED ORDER — CETYLPYRIDINIUM CHLORIDE 0.05 % MT LIQD
7.0000 mL | Freq: Two times a day (BID) | OROMUCOSAL | Status: DC
  Administered 2015-04-27 – 2015-04-28 (×2): 7 mL via OROMUCOSAL

## 2015-04-27 MED ORDER — GUAIFENESIN-DM 100-10 MG/5ML PO SYRP
5.0000 mL | ORAL_SOLUTION | ORAL | Status: DC | PRN
  Administered 2015-04-27: 5 mL via ORAL
  Filled 2015-04-27 (×2): qty 10

## 2015-04-27 MED ORDER — BENZONATATE 100 MG PO CAPS
200.0000 mg | ORAL_CAPSULE | Freq: Three times a day (TID) | ORAL | Status: DC | PRN
  Administered 2015-04-27 – 2015-04-28 (×3): 200 mg via ORAL
  Filled 2015-04-27 (×3): qty 2

## 2015-04-27 MED ORDER — ENOXAPARIN SODIUM 40 MG/0.4ML ~~LOC~~ SOLN
40.0000 mg | SUBCUTANEOUS | Status: DC
  Administered 2015-04-27 – 2015-04-28 (×2): 40 mg via SUBCUTANEOUS
  Filled 2015-04-27 (×2): qty 0.4

## 2015-04-27 MED ORDER — DIAZEPAM 5 MG PO TABS
2.5000 mg | ORAL_TABLET | Freq: Every evening | ORAL | Status: DC | PRN
  Administered 2015-04-27 (×2): 2.5 mg via ORAL
  Filled 2015-04-27 (×2): qty 1

## 2015-04-27 MED ORDER — ONDANSETRON HCL 4 MG/2ML IJ SOLN: 4.0000 mg | Freq: Four times a day (QID) | INTRAMUSCULAR | Status: DC | PRN

## 2015-04-27 MED ORDER — ALPRAZOLAM 0.5 MG PO TABS
0.5000 mg | ORAL_TABLET | Freq: Two times a day (BID) | ORAL | Status: DC | PRN
  Administered 2015-04-27: 0.5 mg via ORAL
  Filled 2015-04-27: qty 1

## 2015-04-27 MED ORDER — ONDANSETRON HCL 4 MG PO TABS: 4.0000 mg | ORAL_TABLET | Freq: Four times a day (QID) | ORAL | Status: DC | PRN

## 2015-04-27 MED ORDER — SERTRALINE HCL 100 MG PO TABS
100.0000 mg | ORAL_TABLET | Freq: Every day | ORAL | Status: DC
  Administered 2015-04-27 – 2015-04-28 (×2): 100 mg via ORAL
  Filled 2015-04-27 (×2): qty 1

## 2015-04-27 MED ORDER — ALBUTEROL (5 MG/ML) CONTINUOUS INHALATION SOLN
10.0000 mg/h | INHALATION_SOLUTION | RESPIRATORY_TRACT | Status: DC
  Administered 2015-04-27: 10 mg/h via RESPIRATORY_TRACT
  Filled 2015-04-27: qty 20

## 2015-04-27 MED ORDER — ACETAMINOPHEN 650 MG RE SUPP: 650.0000 mg | Freq: Four times a day (QID) | RECTAL | Status: DC | PRN

## 2015-04-27 MED ORDER — CEFTRIAXONE SODIUM 1 G IJ SOLR
1.0000 g | Freq: Once | INTRAMUSCULAR | Status: AC
  Administered 2015-04-27: 1 g via INTRAVENOUS
  Filled 2015-04-27: qty 10

## 2015-04-27 MED ORDER — AMLODIPINE BESYLATE 5 MG PO TABS
5.0000 mg | ORAL_TABLET | Freq: Every day | ORAL | Status: DC
  Administered 2015-04-27 – 2015-04-28 (×2): 5 mg via ORAL
  Filled 2015-04-27 (×2): qty 1

## 2015-04-27 MED ORDER — IPRATROPIUM-ALBUTEROL 0.5-2.5 (3) MG/3ML IN SOLN
3.0000 mL | Freq: Four times a day (QID) | RESPIRATORY_TRACT | Status: DC
  Administered 2015-04-27 – 2015-04-28 (×6): 3 mL via RESPIRATORY_TRACT
  Filled 2015-04-27 (×6): qty 3

## 2015-04-27 MED ORDER — ALBUTEROL SULFATE (2.5 MG/3ML) 0.083% IN NEBU
2.5000 mg | INHALATION_SOLUTION | RESPIRATORY_TRACT | Status: DC | PRN
  Administered 2015-04-27 – 2015-04-28 (×2): 2.5 mg via RESPIRATORY_TRACT
  Filled 2015-04-27 (×2): qty 3

## 2015-04-27 MED ORDER — GABAPENTIN 100 MG PO CAPS
100.0000 mg | ORAL_CAPSULE | Freq: Three times a day (TID) | ORAL | Status: DC
  Administered 2015-04-27 – 2015-04-28 (×4): 100 mg via ORAL
  Filled 2015-04-27 (×6): qty 1

## 2015-04-27 MED ORDER — POTASSIUM CHLORIDE CRYS ER 20 MEQ PO TBCR
40.0000 meq | EXTENDED_RELEASE_TABLET | Freq: Once | ORAL | Status: AC
  Administered 2015-04-27: 40 meq via ORAL
  Filled 2015-04-27: qty 2

## 2015-04-27 MED ORDER — METHYLPREDNISOLONE SODIUM SUCC 125 MG IJ SOLR
60.0000 mg | Freq: Two times a day (BID) | INTRAMUSCULAR | Status: DC
  Administered 2015-04-27 – 2015-04-28 (×2): 60 mg via INTRAVENOUS
  Filled 2015-04-27 (×2): qty 0.96
  Filled 2015-04-27: qty 2

## 2015-04-27 MED ORDER — ACETAMINOPHEN 325 MG PO TABS: 650.0000 mg | ORAL_TABLET | Freq: Four times a day (QID) | ORAL | Status: DC | PRN

## 2015-04-27 NOTE — ED Notes (Signed)
Pt arrived to the Ed with a complaint of shortness of breath.  Pt states she has COPD.  Pt states that she began having problems breathing around 0400.  Pt was given 2 breathing treatments of 5 mg of albuterol and 0.5 mg atrovent, 125 mg solumedrol, and 2 mg of mag sulfate.  Pt has inspiratory and expiratory wheezing.

## 2015-04-27 NOTE — ED Notes (Signed)
Pt awake. Verbally responsive. Resp even and slightly labored. Noted audible exp wheezing. Pt noted desating to 88%RA. Started O2 at 2lpm via [place]. ABC's intact. ST on monitor. IV saline lock patent and intact. Family at bedside.

## 2015-04-27 NOTE — Progress Notes (Signed)
Report was called to [facility]. Patient will be soon transferred there.

## 2015-04-27 NOTE — Procedures (Signed)
Pt refuses the use of BiPAP.  

## 2015-04-27 NOTE — Progress Notes (Signed)
Pt ABG results on 8 Lpm Continuous Aerosol Treatment are pH = 7.36, PaCO2 = 43.9, PaO2 = 112, and HCO3 = 24.5.  Discussed with MD and will hold BiPAP at this time.  Will continue to monitor Pt closely and revisit BiPAP option if needed.

## 2015-04-27 NOTE — Progress Notes (Signed)
Pt is AOX4 admited to [facility].

## 2015-04-27 NOTE — ED Notes (Signed)
Bed: FU07 Expected date:  Expected time:  Means of arrival:  Comments: EMS COPD / Emphysema

## 2015-04-27 NOTE — H&P (Signed)
[facility] History and Physical  [name] UUV:[id number] DOB: 06-23-1941 DOA: 04/27/2015  Referring physician: Randal Buba PCP: Wenda Low, MD   Chief Complaint: sob  HPI: [name] is a 74 y.o. female with prior h/o hypertension, GERD, COPD, hyperlipidemia, anxiety, depression, back pain, presented to ED with symptoms of sudden onset of sob associated with diffuse bilateral wheezing. She was given solumedrol, continuous albuterol neb treatment on the way to ED by EMS.  On arrival to ED, her breathing improved, but she was still hypoxic with oxygen sats in 80%'s. She was put on [place] oxygen and referred to medical service for admission.  She denies fevers or chills, chest pain, or upper respiratory symptoms. She has mild cough , productive.CXR does not show any pneumonia, . Labs reveal mild hypokalemia. She was referred to admission for stepdown .    Review of Systems:  Constitutional:  No weight loss, night sweats, Fevers, chills, fatigue.  HEENT:  No headaches, Difficulty swallowing,Tooth/dental problems,Sore throat,  No sneezing, itching, ear ache, nasal congestion, post nasal drip,  Cardio-vascular:  No chest pain, Orthopnea, PND, swelling in lower extremities, anasarca, dizziness, palpitations  GI:  No heartburn, indigestion, abdominal pain, nausea, vomiting, diarrhea, change in bowel habits, loss of appetite  Resp:  SOB , WHEEZING.  Skin:  no rash or lesions.  GU:  no dysuria, change in color of urine, no urgency or frequency. No flank pain.  Musculoskeletal:  No joint pain or swelling. No decreased range of motion. No back pain.    Past Medical History  Diagnosis Date  . Hypertension   . GERD (gastroesophageal reflux disease)   . Anxiety   . Depression   . COPD (chronic obstructive pulmonary disease) ([place])   . Low back pain   . Lumbar back pain   . Osteoporosis   . Osteoarthritis   . Hyperlipidemia   . Cyst     left side of neck  . Cancer [facility])      uterine   Past Surgical History  Procedure Laterality Date  . Cholecystectomy    . Appendectomy    . Total abdominal hysterectomy    . Back surgery    . Knee surgery      Right knee  . Hip surgery  01/2010    Left Hip   . Mass excision  08/25/2011    Procedure: EXCISION MASS;  Surgeon: Harl Bowie, MD;  Location: [place];  Service: General;  Laterality: N/A;  excision left neck mass   Social History:  reports that she has been smoking Cigarettes.  She has been smoking about 2.00 packs per day. She has never used smokeless tobacco. She reports that she does not drink alcohol or use illicit drugs.  Allergies  Allergen Reactions  . Boniva [Ibandronate Sodium]   . Codeine Sulfate   . Nitrofurantoin Monohyd Macro Rash    Family History  Problem Relation Age of Onset  . Heart disease Mother   . Heart disease Father   . Cancer Sister     breast    Prior to Admission medications   Medication Sig Start Date End Date Taking? Authorizing Provider  albuterol (PROVENTIL HFA;VENTOLIN HFA) 108 (90 BASE) MCG/ACT inhaler Inhale 2 puffs into the lungs every 6 (six) hours as needed.     Yes Historical Provider, MD  amLODipine (NORVASC) 2.5 MG tablet Take 5 mg by mouth daily.   Yes Historical Provider, MD  atorvastatin (LIPITOR) 20 MG tablet Take 20  mg by mouth at bedtime.   Yes Historical Provider, MD  diazepam (VALIUM) 5 MG tablet Take 2.5 mg by mouth at bedtime as needed for anxiety (for nerves).    Yes Historical Provider, MD  gabapentin (NEURONTIN) 100 MG capsule Take 100 mg by mouth 3 (three) times daily.   Yes Historical Provider, MD  lisinopril (PRINIVIL,ZESTRIL) 30 MG tablet Take 30 mg by mouth every morning.   Yes Historical Provider, MD  metoCLOPramide (REGLAN) 5 MG tablet Take 5 mg by mouth 2 (two) times daily.    Yes Historical Provider, MD  Multiple Vitamins-Minerals (MULTIVITAMIN WITH MINERALS) tablet Take 1 tablet by mouth daily.     Yes Historical  Provider, MD  raloxifene (EVISTA) 60 MG tablet Take 60 mg by mouth daily.   Yes Historical Provider, MD  sertraline (ZOLOFT) 100 MG tablet Take 100 mg by mouth daily.   Yes Historical Provider, MD  tiotropium (SPIRIVA) 18 MCG inhalation capsule Place 18 mcg into inhaler and inhale daily.     Yes Historical Provider, MD  albuterol (PROVENTIL) (2.5 MG/3ML) 0.083% nebulizer solution Take 2.5 mg by nebulization every 6 (six) hours as needed for wheezing or shortness of breath.    Historical Provider, MD  HYDROcodone-acetaminophen (NORCO) 5-325 MG per tablet Take 1 tablet by mouth 2 (two) times daily as needed for moderate pain.     Historical Provider, MD  predniSONE (DELTASONE) 20 MG tablet 3 tabs po day one, then 2 tabs daily x 4 days Patient not taking: Reported on 04/27/2015 02/17/15   Elnora Morrison, MD   Physical Exam: Filed Vitals:   04/27/15 1017 04/27/15 1200 04/27/15 1400 04/27/15 1600  BP:  136/92 134/62   Pulse: 100 104 110   Temp:    98.1 F (36.7 C)  TempSrc:    Oral  Resp: '2 13 22   '$ Height:      Weight:      SpO2: 94% 93% 93%     Wt Readings from Last 3 Encounters:  04/27/15 70.7 kg (155 lb 13.8 oz)  09/12/11 74.39 kg (164 lb)  08/16/11 74.571 kg (164 lb 6.4 oz)    General:  Appears anxious.  Eyes: PERRL, normal lids, irises & conjunctiva Neck: no LAD, masses or thyromegaly Cardiovascular: RRR, no m/r/g. No LE edema. Respiratory: fair movement of air, scattered wheezing heard.  Abdomen: soft, ntnd Skin: no rash or induration seen on limited exam Musculoskeletal: grossly normal tone BUE/BLE Neurologic: grossly non-focal.          Labs on Admission:  Basic Metabolic Panel:  Recent Labs Lab 04/27/15 0632  NA 142  K 3.0*  CL 104  GLUCOSE 154*  BUN 9  CREATININE 1.00   Liver Function Tests: No results for input(s): AST, ALT, ALKPHOS, BILITOT, PROT, ALBUMIN in the last 168 hours. No results for input(s): LIPASE, AMYLASE in the last 168 hours. No results for  input(s): AMMONIA in the last 168 hours. CBC:  Recent Labs Lab 04/27/15 0615 04/27/15 0632  WBC 7.6  --   NEUTROABS 5.3  --   HGB 12.7 13.6  HCT 38.3 40.0  MCV 89.7  --   PLT 217  --    Cardiac Enzymes: No results for input(s): CKTOTAL, CKMB, CKMBINDEX, TROPONINI in the last 168 hours.  BNP (last 3 results)  Recent Labs  04/27/15 0614  BNP 48.4    ProBNP (last 3 results) No results for input(s): PROBNP in the last 8760 hours.  CBG: No results for  input(s): GLUCAP in the last 168 hours.  Radiological Exams on Admission: Dg Chest Portable 1 View  04/27/2015   CLINICAL DATA:  Acute onset of shortness of breath and wheezing. Initial encounter.  EXAM: PORTABLE CHEST 1 VIEW  COMPARISON:  Chest radiograph performed 02/17/2015  FINDINGS: The lungs are well-aerated. Mild bibasilar atelectasis is noted. Chronically increased interstitial markings are seen. There is no evidence of pleural effusion or pneumothorax.  The cardiomediastinal silhouette is mildly enlarged. No acute osseous abnormalities are seen.  IMPRESSION: Mild bibasilar atelectasis noted. Chronically increased interstitial markings seen. Mild cardiomegaly.   Electronically Signed   By: Garald Balding M.D.   On: 04/27/2015 06:08    EKG: sinus rhythmat 98/ min.   Assessment/Plan Active Problems:   COPD (chronic obstructive pulmonary disease) (HCC)   Acute respiratory failure (HCC)   Acute respiratory failure from copd exacerbation: - admitted to stepdown, currently on 2 lit [place] oxygen, with sats in 95%.  - IV steroids, duonebs and doxycycline.  - [place] oxygen to keep sats greater than 90%.  - ABG looks decent.    Hypokalemia: Replete as needed.    Hypertension:  Well controlled.    GERD: stable.    Code Status: full code.  DVT Prophylaxis: Family Communication: none at bedside, lives alone.  Disposition Plan: plan to transfer to telemetry later tonight. Get PT eval in am with ambulating oxygen levels.    Time spent: 55 min  [facility] Pager [phone]

## 2015-04-27 NOTE — ED Provider Notes (Signed)
CSN: [id number]     Arrival date & time 04/27/15  2671 History   First MD Initiated Contact with Patient 04/27/15 0524     Chief Complaint  Patient presents with  . Shortness of Breath     (Consider location/radiation/quality/duration/timing/severity/associated sxs/prior Treatment) Patient is a 74 y.o. female presenting with shortness of breath. The history is provided by the EMS personnel. The history is limited by the condition of the patient.  Shortness of Breath Severity:  Severe Onset quality:  Sudden Duration:  2 hours Timing:  Constant Progression:  Unchanged Chronicity:  Recurrent Context: not smoke exposure   Relieved by:  Nothing Worsened by:  Nothing tried Ineffective treatments:  None tried Associated symptoms: wheezing   Associated symptoms: no chest pain, no diaphoresis and no fever   Risk factors: no prolonged immobilization     Past Medical History  Diagnosis Date  . Hypertension   . GERD (gastroesophageal reflux disease)   . Anxiety   . Depression   . COPD (chronic obstructive pulmonary disease) ([place])   . Low back pain   . Lumbar back pain   . Osteoporosis   . Osteoarthritis   . Hyperlipidemia   . Cyst     left side of neck  . Cancer [facility])     uterine   Past Surgical History  Procedure Laterality Date  . Cholecystectomy    . Appendectomy    . Total abdominal hysterectomy    . Back surgery    . Knee surgery      Right knee  . Hip surgery  01/2010    Left Hip   . Mass excision  08/25/2011    Procedure: EXCISION MASS;  Surgeon: Harl Bowie, MD;  Location: [place];  Service: General;  Laterality: N/A;  excision left neck mass   Family History  Problem Relation Age of Onset  . Heart disease Mother   . Heart disease Father   . Cancer Sister     breast   Social History  Substance Use Topics  . Smoking status: Current Every Day Smoker -- 2.00 packs/day    Types: Cigarettes  . Smokeless tobacco: Never Used  . Alcohol  Use: No   OB History    No data available     Review of Systems  Constitutional: Negative for fever and diaphoresis.  Respiratory: Positive for shortness of breath and wheezing.   Cardiovascular: Negative for chest pain.  All other systems reviewed and are negative.     Allergies  Boniva; Codeine sulfate; and Nitrofurantoin monohyd macro  Home Medications   Prior to Admission medications   Medication Sig Start Date End Date Taking? Authorizing Provider  albuterol (PROVENTIL HFA;VENTOLIN HFA) 108 (90 BASE) MCG/ACT inhaler Inhale 2 puffs into the lungs every 6 (six) hours as needed.      Historical Provider, MD  albuterol (PROVENTIL) (2.5 MG/3ML) 0.083% nebulizer solution Take 2.5 mg by nebulization every 6 (six) hours as needed for wheezing or shortness of breath.    Historical Provider, MD  amLODipine (NORVASC) 2.5 MG tablet Take 5 mg by mouth daily.    Historical Provider, MD  atorvastatin (LIPITOR) 20 MG tablet Take 20 mg by mouth at bedtime.    Historical Provider, MD  diazepam (VALIUM) 5 MG tablet Take 2.5 mg by mouth at bedtime as needed for anxiety (for nerves).     Historical Provider, MD  gabapentin (NEURONTIN) 100 MG capsule Take 100 mg by mouth 3 (three) times  daily.    Historical Provider, MD  HYDROcodone-acetaminophen (NORCO) 5-325 MG per tablet Take 1 tablet by mouth 2 (two) times daily as needed for moderate pain.     Historical Provider, MD  lisinopril (PRINIVIL,ZESTRIL) 30 MG tablet Take 30 mg by mouth every morning.    Historical Provider, MD  metoCLOPramide (REGLAN) 5 MG tablet Take 5 mg by mouth 2 (two) times daily.     Historical Provider, MD  Multiple Vitamins-Minerals (MULTIVITAMIN WITH MINERALS) tablet Take 1 tablet by mouth daily.      Historical Provider, MD  predniSONE (DELTASONE) 20 MG tablet 3 tabs po day one, then 2 tabs daily x 4 days 02/17/15   Elnora Morrison, MD  raloxifene (EVISTA) 60 MG tablet Take 60 mg by mouth daily.    Historical Provider, MD   sertraline (ZOLOFT) 100 MG tablet Take 100 mg by mouth daily.    Historical Provider, MD  tiotropium (SPIRIVA) 18 MCG inhalation capsule Place 18 mcg into inhaler and inhale daily.      Historical Provider, MD   BP 117/81 mmHg  Pulse 90  Temp(Src) 97.6 F (36.4 C) (Axillary)  Resp 22  SpO2 98% Physical Exam  Constitutional: She appears well-developed and well-nourished.  HENT:  Head: Normocephalic and atraumatic.  Mouth/Throat: Oropharynx is clear and moist.  Eyes: Conjunctivae are normal. Pupils are equal, round, and reactive to light.  Neck: Normal range of motion. Neck supple.  Cardiovascular: Normal rate, regular rhythm and intact distal pulses.   Pulmonary/Chest: No stridor. She has wheezes. She has no rales. She exhibits no tenderness.  Abdominal: Soft. Bowel sounds are normal. There is no tenderness. There is no rebound and no guarding.  Musculoskeletal: Normal range of motion. She exhibits no edema.  Neurological: She is alert. She has normal reflexes.  Skin: Skin is warm and dry. She is not diaphoretic.  Psychiatric: She has a normal mood and affect.    ED Course  Procedures (including critical care time) Labs Review Labs Reviewed  BLOOD GAS, ARTERIAL - Abnormal; Notable for the following:    pO2, Arterial 112 (*)    Bicarbonate 24.5 (*)    All other components within normal limits  CBC WITH DIFFERENTIAL/PLATELET  BRAIN NATRIURETIC PEPTIDE  I-STAT CHEM 8, ED  I-STAT TROPOININ, ED    Imaging Review Dg Chest Portable 1 View  04/27/2015   CLINICAL DATA:  Acute onset of shortness of breath and wheezing. Initial encounter.  EXAM: PORTABLE CHEST 1 VIEW  COMPARISON:  Chest radiograph performed 02/17/2015  FINDINGS: The lungs are well-aerated. Mild bibasilar atelectasis is noted. Chronically increased interstitial markings are seen. There is no evidence of pleural effusion or pneumothorax.  The cardiomediastinal silhouette is mildly enlarged. No acute osseous abnormalities  are seen.  IMPRESSION: Mild bibasilar atelectasis noted. Chronically increased interstitial markings seen. Mild cardiomegaly.   Electronically Signed   By: Garald Balding M.D.   On: 04/27/2015 06:08   I have personally reviewed and evaluated these images and lab results as part of my medical decision-making.   EKG Interpretation   Date/Time:  Monday April 27 2015 06:03:25 EDT Ventricular Rate:  98 PR Interval:  155 QRS Duration: 86 QT Interval:  381 QTC Calculation: 486 R Axis:   44 Text Interpretation:  Sinus rhythm Confirmed by [facility]  MD, APRIL  ([id number]) on 04/27/2015 6:18:40 AM      MDM   Final diagnoses:  None    Medications  albuterol (PROVENTIL,VENTOLIN) solution continuous neb (10 mg/hr Nebulization  New Bag/Given 04/27/15 0543)  cefTRIAXone (ROCEPHIN) 1 g in dextrose 5 % 50 mL IVPB (not administered)   Given solumedrol, albuterol and 2 grams of magnesium en route   MDM Reviewed: previous chart, nursing note and vitals Reviewed previous: x-ray and labs Interpretation: labs, ECG and x-ray (hypokalemia, NACPD by me on cxr  ) Total time providing critical care: 30-74 minutes. This excludes time spent performing separately reportable procedures and services. Consults: admitting MD  CRITICAL CARE Performed by: Carlisle Beers Total critical care time: 61 minutes Critical care time was exclusive of separately billable procedures and treating other patients. Critical care was necessary to treat or prevent imminent or life-threatening deterioration. Critical care was time spent personally by me on the following activities: development of treatment plan with patient and/or surrogate as well as nursing, discussions with consultants, evaluation of patient's response to treatment, examination of patient, obtaining history from patient or surrogate, ordering and performing treatments and interventions, ordering and review of laboratory studies, ordering and review of  radiographic studies, pulse oximetry and re-evaluation of patient's condition.    Veatrice Kells, MD 04/27/15 [phone]

## 2015-04-27 NOTE — Care Management Note (Signed)
Case Management Note  Patient Details  Name: [name] MRN: [id number] Date of Birth: 20-Oct-1940  Subjective/Objective:                 Asthmatic status versus copd   Action/Plan:Date:  Oct. 03, 2016 U.R. performed for needs and level of care. Will continue to follow for Case Management needs.  Velva Harman, RN, BSN, [place]   [phone]   Expected Discharge Date:                  Expected Discharge Plan:  [place]  In-House Referral:  Clinical Social Work  Discharge planning Services  CM Consult  Post Acute Care Choice:  NA Choice offered to:  NA  DME Arranged:    DME Agency:     HH Arranged:    [place] Agency:     Status of Service:  Completed, signed off  Medicare Important Message Given:    Date Medicare IM Given:    Medicare IM give by:    Date Additional Medicare IM Given:    Additional Medicare Important Message give by:     If discussed at [place] of Stay Meetings, dates discussed:    Additional Comments:  Leeroy Cha, RN 04/27/2015, 12:04 PM

## 2015-04-28 ENCOUNTER — Encounter: Payer: Self-pay | Admitting: *Deleted

## 2015-04-28 LAB — COMPREHENSIVE METABOLIC PANEL
ALT: 26 U/L (ref 14–54)
AST: 42 U/L — ABNORMAL HIGH (ref 15–41)
Albumin: 3.7 g/dL (ref 3.5–5.0)
Alkaline Phosphatase: 60 U/L (ref 38–126)
Anion gap: 10 (ref 5–15)
BUN: 16 mg/dL (ref 6–20)
CO2: 25 mmol/L (ref 22–32)
Calcium: 9.1 mg/dL (ref 8.9–10.3)
Chloride: 107 mmol/L (ref 101–111)
Creatinine, Ser: 0.99 mg/dL (ref 0.44–1.00)
GFR calc Af Amer: >60 mL/min (ref >60)
GFR calc non Af Amer: 56 mL/min — ABNORMAL LOW (ref >60)
Glucose, Bld: 108 mg/dL — ABNORMAL HIGH (ref 65–99)
Potassium: 4.1 mmol/L (ref 3.5–5.1)
Sodium: 142 mmol/L (ref 135–145)
Total Bilirubin: 0.5 mg/dL (ref 0.3–1.2)
Total Protein: 7.3 g/dL (ref 6.5–8.1)

## 2015-04-28 LAB — CBC
HCT: 38.4 % (ref 36.0–46.0)
Hemoglobin: 12.4 g/dL (ref 12.0–15.0)
MCH: 29.3 pg (ref 26.0–34.0)
MCHC: 32.3 g/dL (ref 30.0–36.0)
MCV: 90.8 fL (ref 78.0–100.0)
Platelets: 266 10*3/uL (ref 150–400)
RBC: 4.23 MIL/uL (ref 3.87–5.11)
RDW: 14.6 % (ref 11.5–15.5)
WBC: 20.6 10*3/uL — ABNORMAL HIGH (ref 4.0–10.5)

## 2015-04-28 MED ORDER — GUAIFENESIN-DM 100-10 MG/5ML PO SYRP: 5.0000 mL | ORAL_SOLUTION | ORAL | Status: DC | PRN

## 2015-04-28 MED ORDER — PANTOPRAZOLE SODIUM 40 MG PO TBEC
40.0000 mg | DELAYED_RELEASE_TABLET | Freq: Every day | ORAL | Status: DC
  Administered 2015-04-28: 40 mg via ORAL

## 2015-04-28 MED ORDER — IPRATROPIUM-ALBUTEROL 0.5-2.5 (3) MG/3ML IN SOLN: 3.0000 mL | Freq: Four times a day (QID) | RESPIRATORY_TRACT | Status: DC | PRN

## 2015-04-28 MED ORDER — PREDNISONE 20 MG PO TABS: ORAL_TABLET | ORAL | Status: DC

## 2015-04-28 MED ORDER — ALBUTEROL SULFATE (2.5 MG/3ML) 0.083% IN NEBU: 2.5000 mg | INHALATION_SOLUTION | RESPIRATORY_TRACT | Status: DC | PRN

## 2015-04-28 MED ORDER — ALUM & MAG HYDROXIDE-SIMETH 200-200-20 MG/5ML PO SUSP
15.0000 mL | Freq: Four times a day (QID) | ORAL | Status: DC | PRN
  Administered 2015-04-28: 15 mL via ORAL
  Filled 2015-04-28: qty 30

## 2015-04-28 MED ORDER — PREDNISONE 20 MG PO TABS
40.0000 mg | ORAL_TABLET | Freq: Every day | ORAL | Status: DC
Start: 2015-04-29 — End: 2015-04-28

## 2015-04-28 MED ORDER — BENZONATATE 200 MG PO CAPS: 200.0000 mg | ORAL_CAPSULE | Freq: Three times a day (TID) | ORAL | Status: DC | PRN

## 2015-04-28 MED ORDER — ALBUTEROL SULFATE HFA 108 (90 BASE) MCG/ACT IN AERS: 2.0000 | INHALATION_SPRAY | Freq: Four times a day (QID) | RESPIRATORY_TRACT | Status: DC | PRN

## 2015-04-28 MED ORDER — PANTOPRAZOLE SODIUM 40 MG PO TBEC: 40.0000 mg | DELAYED_RELEASE_TABLET | Freq: Every day | ORAL | Status: DC

## 2015-04-28 MED ORDER — ALBUTEROL SULFATE HFA 108 (90 BASE) MCG/ACT IN AERS
2.0000 | INHALATION_SPRAY | RESPIRATORY_TRACT | Status: DC | PRN
  Filled 2015-04-28: qty 6.7

## 2015-04-28 NOTE — Discharge Summary (Signed)
Physician Discharge Summary  [name]:[id number] DOB: September 09, 1940 DOA: 04/27/2015  PCP: Wenda Low, MD  Admit date: 04/27/2015 Discharge date: 04/28/2015  Time spent: 30 minutes  Recommendations for Outpatient Follow-up:  1. Follow up with PCP in one week.   Discharge Diagnoses:  Active Problems:   COPD (chronic obstructive pulmonary disease) ([place])   Acute respiratory failure ([place])   Discharge Condition: improved.   Diet recommendation: regular   Filed Weights   04/27/15 0900  Weight: 70.7 kg (155 lb 13.8 oz)    History of present illness:  [name] is a 74 y.o. female with prior h/o hypertension, GERD, COPD, hyperlipidemia, anxiety, depression, back pain, presented to ED with symptoms of sudden onset of sob associated with diffuse bilateral wheezing. She was admitted for copd exacerbation.   Hospital Course:  Acute respiratory failure from copd exacerbation: - admitted to stepdown, later transferred to telemetry. - she was started on IV steroids, duonebs , her breathing improved, and she was transitioned to po steroids to a quick taper. On ambulation her oxygen sats did drop to 88%, plan to discharge her home on home oxygen.    Hypokalemia: Replete as needed. Repeat in am is normal.    Hypertension:  Well controlled.    GERD: stable.     Procedures:  none  Consultations:  none  Discharge Exam: Filed Vitals:   04/28/15 1329  BP: 141/90  Pulse: 87  Temp: 98.9 F (37.2 C)  Resp: 22    General: alert afebrile comfortable.  Cardiovascular: s1s2 Respiratory: ctab.   Discharge Instructions   Discharge Instructions    AMB Referral to [place] Management    Complete by:  As directed   Please assign to [place] and [facility] Pharmacist. Consents signed. [facility] RNCM for COPD disease and symptom management. [facility] Pharmacist referral for affordability of medications. Goes to [facility] on [address] and has pharmacy  delivery. Will have [place] for home health as well. Best contact information is [phone] cell or home [phone]. Marthenia Rolling, [place], [facility] [phone]  Reason for consult:  Please assign to [place] and [facility] Pharmacist  Diagnoses of:  COPD/ Pneumonia  Expected date of contact:  1-3 days (reserved for hospital discharges)     Diet - low sodium heart healthy    Complete by:  As directed      Discharge instructions    Complete by:  As directed   Follow up with PCP in one week.          Discharge Medication List as of 04/28/2015  4:23 PM    START taking these medications   Details  benzonatate (TESSALON) 200 MG capsule Take 1 capsule (200 mg total) by mouth 3 (three) times daily as needed for cough., Starting 04/28/2015, Until Discontinued, Normal    guaiFENesin-dextromethorphan (ROBITUSSIN DM) 100-10 MG/5ML syrup Take 5 mLs by mouth every 4 (four) hours as needed for cough., Starting 04/28/2015, Until Discontinued, Normal    ipratropium-albuterol (DUONEB) 0.5-2.5 (3) MG/3ML SOLN Take 3 mLs by nebulization every 6 (six) hours as needed., Starting 04/28/2015, Until Discontinued, Print    pantoprazole (PROTONIX) 40 MG tablet Take 1 tablet (40 mg total) by mouth daily at 6 (six) AM., Starting 04/28/2015, Until Discontinued, Print      CONTINUE these medications which have CHANGED   Details  albuterol (PROVENTIL HFA;VENTOLIN HFA) 108 (90 BASE) MCG/ACT inhaler Inhale 2 puffs into the lungs every 6 (six) hours as  needed., Starting 04/28/2015, Until Discontinued, Print    albuterol (PROVENTIL) (2.5 MG/3ML) 0.083% nebulizer solution Take 3 mLs (2.5 mg total) by nebulization every 2 (two) hours as needed for wheezing or shortness of breath., Starting 04/28/2015, Until Discontinued, Print    predniSONE (DELTASONE) 20 MG tablet Prednisone 40 mg daily for 3 days followed by  Prednisone 20 mg daily for 3 days., Print      CONTINUE these medications which have NOT  CHANGED   Details  amLODipine (NORVASC) 2.5 MG tablet Take 5 mg by mouth daily., Until Discontinued, Historical Med    atorvastatin (LIPITOR) 20 MG tablet Take 20 mg by mouth at bedtime., Until Discontinued, Historical Med    diazepam (VALIUM) 5 MG tablet Take 2.5 mg by mouth at bedtime as needed for anxiety (for nerves). , Until Discontinued, Historical Med    gabapentin (NEURONTIN) 100 MG capsule Take 100 mg by mouth 3 (three) times daily., Until Discontinued, Historical Med    lisinopril (PRINIVIL,ZESTRIL) 30 MG tablet Take 30 mg by mouth every morning., Until Discontinued, Historical Med    metoCLOPramide (REGLAN) 5 MG tablet Take 5 mg by mouth 2 (two) times daily. , Until Discontinued, Historical Med    Multiple Vitamins-Minerals (MULTIVITAMIN WITH MINERALS) tablet Take 1 tablet by mouth daily.  , Until Discontinued, Historical Med    raloxifene (EVISTA) 60 MG tablet Take 60 mg by mouth daily., Until Discontinued, Historical Med    sertraline (ZOLOFT) 100 MG tablet Take 100 mg by mouth daily., Until Discontinued, Historical Med    tiotropium (SPIRIVA) 18 MCG inhalation capsule Place 18 mcg into inhaler and inhale daily.  , Until Discontinued, Historical Med    HYDROcodone-acetaminophen (NORCO) 5-325 MG per tablet Take 1 tablet by mouth 2 (two) times daily as needed for moderate pain. , Until Discontinued, Historical Med       Allergies  Allergen Reactions  . Boniva [Ibandronate Sodium]   . Codeine Sulfate   . Nitrofurantoin Monohyd Macro Rash   Follow-up Information    Follow up with HUSAIN,KARRAR, MD. Schedule an appointment as soon as possible for a visit in 1 week.   Specialty:  Internal Medicine   Contact information:   [address] [place] [address] [phone]       Follow up with [place].   Why:  [facility]   Contact information:   [address] [place] [address] [phone]       Follow up with [place].   Why:  Home 02   Contact information:   [address] [place] [address] [phone]        The results of significant diagnostics from this hospitalization (including imaging, microbiology, ancillary and laboratory) are listed below for reference.    Significant Diagnostic Studies: Dg Chest Portable 1 View  04/27/2015   CLINICAL DATA:  Acute onset of shortness of breath and wheezing. Initial encounter.  EXAM: PORTABLE CHEST 1 VIEW  COMPARISON:  Chest radiograph performed 02/17/2015  FINDINGS: The lungs are well-aerated. Mild bibasilar atelectasis is noted. Chronically increased interstitial markings are seen. There is no evidence of pleural effusion or pneumothorax.  The cardiomediastinal silhouette is mildly enlarged. No acute osseous abnormalities are seen.  IMPRESSION: Mild bibasilar atelectasis noted. Chronically increased interstitial markings seen. Mild cardiomegaly.   Electronically Signed   By: Garald Balding M.D.   On: 04/27/2015 06:08    Microbiology: Recent Results (from the past  240 hour(s))  MRSA PCR Screening     Status: None   Collection Time: 04/27/15  8:56 AM  Result Value Ref Range Status   MRSA by PCR NEGATIVE NEGATIVE Final    Comment:        The GeneXpert MRSA Assay (FDA approved for NASAL specimens only), is one component of a comprehensive MRSA colonization surveillance program. It is not intended to diagnose MRSA infection nor to guide or monitor treatment for MRSA infections.      Labs: Basic Metabolic Panel:  Recent Labs Lab 04/27/15 0632 04/28/15 0543  NA 142 142  K 3.0* 4.1  CL 104 107  CO2  --  25  GLUCOSE 154* 108*  BUN 9 16  CREATININE 1.00 0.99  CALCIUM  --  9.1   Liver Function Tests:  Recent Labs Lab 04/28/15 0543  AST 42*  ALT 26  ALKPHOS 60  BILITOT 0.5  PROT 7.3  ALBUMIN 3.7   No results for input(s): LIPASE, AMYLASE in the last 168 hours. No results for input(s): AMMONIA in the last  168 hours. CBC:  Recent Labs Lab 04/27/15 0615 04/27/15 0632 04/28/15 0543  WBC 7.6  --  20.6*  NEUTROABS 5.3  --   --   HGB 12.7 13.6 12.4  HCT 38.3 40.0 38.4  MCV 89.7  --  90.8  PLT 217  --  266   Cardiac Enzymes: No results for input(s): CKTOTAL, CKMB, CKMBINDEX, TROPONINI in the last 168 hours. BNP: BNP (last 3 results)  Recent Labs  04/27/15 0614  BNP 48.4    ProBNP (last 3 results) No results for input(s): PROBNP in the last 8760 hours.  CBG: No results for input(s): GLUCAP in the last 168 hours.     SignedHosie Poisson  [facility] 04/28/2015, 9:28 PM

## 2015-04-28 NOTE — Patient Outreach (Signed)
[facility]) Care Management  04/28/2015  [name] [birth year] [id number]   Referral from Marthenia Rolling, RN to assign Pharmacy and [employer], assigned [name], PharmD and Merlene Morse Minor, RN (for [name], RN).  Thanks, Ronnell Freshwater. [place], [place] Assistant Phone: [phone] Fax: [phone]

## 2015-04-28 NOTE — Progress Notes (Signed)
Patient ambulated in hallway,steady gait. Oxygen saturation assessed on room air while ambulating in hall, o2 sat decreased 88% and recovered at rest 92% ra

## 2015-04-28 NOTE — Care Management Note (Signed)
Case Management Note  Patient Details  Name: KWYNN SCHLOTTER MRN: [id number] Date of Birth: 22-Mar-1941  Subjective/Objective: [facility] chosen for [facility], qualifies for home 02,await orders.Await 02 sats to be documented in progress notes.[facility] rep, & dme rep aware of d/c & orders. DME to be delivered to rm once all requirements satisfied.                  Action/Plan:d/c home w/[facility]/DME.   Expected Discharge Date:                  Expected Discharge Plan:  [place]  In-House Referral:  Clinical Social Work  Discharge planning Services  CM Consult  Post Acute Care Choice:  NA Choice offered to:  NA, Patient  DME Arranged:  Oxygen DME Agency:  [place]:  RN [facility] Agency:     Status of Service:  Completed, signed off  Medicare Important Message Given:    Date Medicare IM Given:    Medicare IM give by:    Date Additional Medicare IM Given:    Additional Medicare Important Message give by:     If discussed at [place] of Stay Meetings, dates discussed:    Additional Comments:  Dessa Phi, RN 04/28/2015, 1:43 PM

## 2015-04-28 NOTE — Progress Notes (Addendum)
SATURATION QUALIFICATIONS:  O2 desat with ambulation  Patient Saturations on Room Air at Rest = 90-93%  Patient Saturations on Room Air while Ambulating = 88 %  Patient Saturations on 2  Liters of oxygen while Ambulating =  92-94%  Please briefly explain why patient needs home oxygen: Patients breathing intermittently becomes labored while ambulating causing oxygen saturation to decrease to 88% on room air.

## 2015-04-28 NOTE — Evaluation (Addendum)
Physical Therapy One Time Evaluation Patient Details Name: [name] MRN: [id number] DOB: 05/30/1941 Today's Date: 04/28/2015   History of Present Illness  74 y.o. female with prior h/o hypertension, GERD, COPD, hyperlipidemia, anxiety, depression, back pain, presented to ED with symptoms of sudden onset of SOB associated with diffuse bilateral wheezing and admitted for acute respiratory failure.  Clinical Impression  Patient evaluated by Physical Therapy with no further acute PT needs identified. All education has been completed and the patient has no further questions.  Pt ambulated good distance in hallway and SPO2 88% or above on room air during session.  SpO2 92% room air upon leaving room and RN notified pt left off oxygen. Pt does not present with any follow-up Physical Therapy or equipment needs. PT is signing off. Thank you for this referral.     Follow Up Recommendations No PT follow up    Equipment Recommendations  None recommended by PT    Recommendations for Other Services       Precautions / Restrictions Precautions Precautions: None      Mobility  Bed Mobility Overal bed mobility: Modified Independent                Transfers Overall transfer level: Modified independent Equipment used: None                Ambulation/Gait Ambulation/Gait assistance: Supervision Ambulation Distance (Feet): 300 Feet Assistive device: None Gait Pattern/deviations: Step-through pattern     General Gait Details: steady gait, no LOB observed, SpO2 lowest 88% room air however pt reports no SOB, HR 121 during gait  Stairs            Wheelchair Mobility    Modified Rankin (Stroke Patients Only)       Balance Overall balance assessment: No apparent balance deficits (not formally assessed)                                           Pertinent Vitals/Pain Pain Assessment: No/denies pain    Home Living Family/patient expects to be  discharged to:: Private residence Living Arrangements: Alone Available Help at Discharge: Family;Available PRN/intermittently Type of Home: House       Home Layout: One level Home Equipment: None      Prior Function Level of Independence: Independent               Hand Dominance        Extremity/Trunk Assessment   Upper Extremity Assessment: Overall WFL for tasks assessed           Lower Extremity Assessment: Overall WFL for tasks assessed      Cervical / Trunk Assessment: Normal  Communication   Communication: No difficulties  Cognition Arousal/Alertness: Awake/alert Behavior During Therapy: WFL for tasks assessed/performed Overall Cognitive Status: Within Functional Limits for tasks assessed                      General Comments      Exercises        Assessment/Plan    PT Assessment Patent does not need any further PT services  PT Diagnosis     PT Problem List    PT Treatment Interventions     PT Goals (Current goals can be found in the Care Plan section) Acute Rehab PT Goals PT Goal Formulation: All assessment and education complete, DC  therapy    Frequency     Barriers to discharge        Co-evaluation               End of Session   Activity Tolerance: Patient tolerated treatment well Patient left: in chair;with call bell/phone within reach Nurse Communication: Mobility status         Time: 9381-0175 PT Time Calculation (min) (ACUTE ONLY): 18 min   Charges:   PT Evaluation $Initial PT Evaluation Tier I: 1 Procedure     PT G Codes:        [name] 04/28/2015, 12:31 PM [name], PT, DPT 04/28/2015 Pager: [phone]

## 2015-04-28 NOTE — Consult Note (Signed)
   [facility] Inpatient Consult   04/28/2015  YIZEL CANBY 21-Nov-1940 [id number]   Met with Ms. Wiersma at bedside to discuss, explain, and offer [place] [facility] services. She endorses she lives alone. She will have home health at discharge as well thru [facility] as well as home 02. Ms. Moncure is agreeable to [facility] services and consents were signed. Explained that [facility] will not interfere or replace services provided by home health. She will receive post hospital transition of care calls and will be evaluated for monthly home visits. Ms. Burgio endorses she sometimes has issues with affording her medications, especially inhalers. She reports that she gets her meds thru [employer] on [address]. Also states she has home pharmacy delivery. Advised her to please follow up with her Primary Care MD within the next week for post hospital follow up. Left [facility] Care Management packet and contact information at bedside. Made inpatient RNCM aware [facility] to follow. Will request for patient to be assigned to [place] [facility] Pharmacist.   Marthenia Rolling, [place], RN,BSN [facility] Liaison [phone]

## 2015-04-29 ENCOUNTER — Other Ambulatory Visit: Payer: Self-pay | Admitting: Pharmacist

## 2015-04-29 NOTE — Patient Outreach (Signed)
[name] was referred to pharmacy for medication assistance. Left a HIPAA compliant message on the patient's voicemail. If have not heard from patient by 05/01/15, will give her another call at that time.  [name], PharmD Clinical Pharmacist [place] [facility] [phone]

## 2015-04-30 ENCOUNTER — Other Ambulatory Visit: Payer: Self-pay | Admitting: *Deleted

## 2015-04-30 NOTE — Patient Outreach (Signed)
RNCM called pt as part of the transition of care program. Pt d/c 10/4 after COPD exacerbation. Pt alert and oriented x3. Pleasant to talk to and reports no problems with discouragement or depression.   Med review done and noted to not have ordered prednisone. RNCM made a phone call to [place] to see if this medication had been called in and it had not been. RNCM called pt back and had her to look through discharge papers for prescription to take to pharmacy the next day. This was the only medication discrepancy noted. RNCM will request [facility]N-Pharmacist ask pt about this during her planned call tomorrow. Overall pt had a fairly good understanding of purposes for her medications and was clear on their doses and proper times.   Pt reports home health agency had not been in contact with her as of yet since discharge. Pt reports that inpatient nurse told pt home health agency not scheduled to contact her until Monday. RNCM planned a visit with pt on Tuesday and will follow up on home health status.   Pt stating she had scheduled follow up with Md for Oct 18. Pt reports not having a pulmonologist. Pt stating her breathing was a lot better and she was feeling a lot better. Denies SOB and coughing up sputum with any color to it. Pt denied being familiar with the COPD zones. RNCM educated pt on different symptoms on COPD zones and when to call the MD. [facility] confirmed pt had MD number if she needed to call him. Pt reporting she has all oxygen supplies, but does report portable O2 too heavy for her to manage. RNCM guided pt in finding the O2 supply company name to call and discuss other options.   Transition of care flow sheet completed and f/u home visit scheduled.    Plan: RNCM will see pt 10/11 for initial home visit.  RNCM will make MD of [facility] involvement. RNCM will collaborate with [facility]-Pharmacist to inquire about pt's prednisone.   Rutherford Limerick RN, BSN  [facility] Care Management [phone])

## 2015-05-01 ENCOUNTER — Other Ambulatory Visit: Payer: Self-pay | Admitting: Pharmacist

## 2015-05-01 NOTE — Patient Outreach (Signed)
[name] is a 74 y.o. female referred to pharmacy for medication assistance. Called and spoke with [name] who reports that she has [facility] Medicare coverage and that she is currently having trouble with affording her medications, particularly her Spiriva and Ventolin.  Note that I also received a communication from Nurse Care Manager Janci Minor today that patient was also having trouble with obtaining her prescription for prednisone from her pharmacy today. [name] reports that this issue with the prednisone is resolved and that she now has the medication. Briefly counsel patient about prednisone, but she states that she has taken it in the past and has no further questions.   Patient reports that she currently gets her medications through [facility]. Counsel patient that as she has [employer], she can get savings by getting her medication through [employer]. Patient reports that she has received letters about this from [facility], but was not sure of how to get started. [place] with patient and provide her with the contact phone number. [name] states that she will call today about getting this process started.  Also discuss with patient the Extra Help application and patient assistance. [name] reports that she has never applied for the Extra Help before and would like help with this application and the patient assistance. Let [name] know that I will reach out to Care Management Assistant Damita Rhodie to ask her to reach out to the patient to help her to complete the Extra Help application and patient assistance applications for Spiriva and Ventolin.  Patient reports that she has no further questions for me at this time. Provide patient with my phone number.  PLAN:  1) [name] to call [employer] mail order to initiate using this service for cost savings  2) Will send and InBasket message to Care Management Assistant Damita Rhodie to  ask her to reach out to the patient to help her to complete the Extra Help application and patient assistance applications for Spiriva and Ventolin.  3) Will follow up with [name] in about 1 month.  [name], PharmD Clinical Pharmacist [place] Management [phone]

## 2015-05-04 NOTE — Patient Outreach (Signed)
[place] [facility]) Care Management  05/04/2015  ARLAYNE LIGGINS 10/23/1940 [id number]   I called [name] today to enroll her into Extra Help.  She was willing to do it over the phone.  We completed the application online and submitted it today.  She should hear from social security administration in the next 2 to 4 weeks.  She is going to call me when she hears from them.  If she gets denied, I will begin the patient assistance application for [employer] access and [employer]. I will follow up in 3 weeks.  Damita L. Rhodie, [place] Care Management Assistant

## 2015-05-05 ENCOUNTER — Other Ambulatory Visit: Payer: Self-pay | Admitting: *Deleted

## 2015-05-05 ENCOUNTER — Encounter: Payer: Self-pay | Admitting: *Deleted

## 2015-05-05 NOTE — Care Management Note (Signed)
Case Management Note  Patient Details  Name: [name] MRN: [id number] Date of Birth: [birth year]  Subjective/Objective: Floor received call from [facility] -home nurse, & passed note to this CM-THN nurse-Nanci Minor asking about [facility] from [facility] for patient.Patient d/c on 04/28/15.  [facility] rep Cyril Mourning made aware of request for HHRN-she will f/u on [facility] that was ordered.THN liason-[place] notifed.No further needs.               Action/Plan:   Expected Discharge Date:                  Expected Discharge Plan:  [place]  In-House Referral:  Clinical Social Work  Discharge planning Services  CM Consult  Post Acute Care Choice:  NA Choice offered to:  NA, Patient  DME Arranged:  Oxygen DME Agency:  [place]:  RN [facility] Agency:     Status of Service:  Completed, signed off  Medicare Important Message Given:    Date Medicare IM Given:    Medicare IM give by:    Date Additional Medicare IM Given:    Additional Medicare Important Message give by:     If discussed at [place] of Stay Meetings, dates discussed:    Additional Comments:  Dessa Phi, RN 05/05/2015, 12:30 PM

## 2015-05-05 NOTE — Patient Outreach (Signed)
[name]) Care Management   05/05/2015  [name] May 23, 1941 [id number]  [name] is an 74 y.o. female  Subjective: " I am feeling a lot better since my hospital stay." "I don't use a pill organizer I just look at the pills and take them." "The home health nurse has not called me yet"  Objective: Blood pressure 128/78, pulse 74, resp. rate 20, height 1.524 m (5'), weight 161 lb (73.029 kg), SpO2 97 % on room air.  Review of Systems  Constitutional: Positive for weight loss and malaise/fatigue.  HENT: Positive for hearing loss.   Respiratory: Positive for cough, sputum production and wheezing.   Musculoskeletal: Positive for back pain. Negative for falls.  Psychiatric/Behavioral: Positive for depression.    Physical Exam  Constitutional: She is oriented to person, place, and time. She appears well-developed and well-nourished.  Cardiovascular: Normal rate, regular rhythm and normal heart sounds.   Pulses:      Dorsalis pedis pulses are 2+ on the right side, and 2+ on the left side.  Respiratory: Effort normal. She has wheezes.  GI: Soft. Bowel sounds are normal.  Musculoskeletal: Normal range of motion.  Neurological: She is alert and oriented to person, place, and time.  Skin: Skin is warm, dry and intact.  Psychiatric: She has a normal mood and affect. Her speech is normal and behavior is normal. Judgment and thought content normal. Cognition and memory are normal.  Pt stated she was depressed and wished her antidepressant could be increased. Pt was pleasant and upbeat, but scored high on the PHQ9 scale.     Current Medications:   Current Outpatient Prescriptions  Medication Sig Dispense Refill  . albuterol (PROVENTIL HFA;VENTOLIN HFA) 108 (90 BASE) MCG/ACT inhaler Inhale 2 puffs into the lungs every 6 (six) hours as needed. 18 g 1  . albuterol (PROVENTIL) (2.5 MG/3ML) 0.083% nebulizer solution Take 3 mLs (2.5 mg total) by nebulization every 2  (two) hours as needed for wheezing or shortness of breath. 75 mL 12  . amLODipine (NORVASC) 2.5 MG tablet Take 5 mg by mouth daily.    Marland Kitchen atorvastatin (LIPITOR) 20 MG tablet Take 20 mg by mouth at bedtime.    . benzonatate (TESSALON) 200 MG capsule Take 1 capsule (200 mg total) by mouth 3 (three) times daily as needed for cough. 20 capsule 0  . Calcium-Magnesium-Vitamin D (CALCIUM 1200+D3 PO) Take 2 tablets by mouth daily.    . diazepam (VALIUM) 5 MG tablet Take 2.5 mg by mouth at bedtime as needed for anxiety (for nerves).     . gabapentin (NEURONTIN) 100 MG capsule Take 100 mg by mouth 3 (three) times daily.    Marland Kitchen HYDROcodone-acetaminophen (NORCO) 5-325 MG per tablet Take 1 tablet by mouth 2 (two) times daily as needed for moderate pain.     Marland Kitchen ipratropium-albuterol (DUONEB) 0.5-2.5 (3) MG/3ML SOLN Take 3 mLs by nebulization every 6 (six) hours as needed. 360 mL 2  . lisinopril (PRINIVIL,ZESTRIL) 30 MG tablet Take 30 mg by mouth every morning.    . magnesium oxide (MAG-OX) 400 MG tablet Take 500 mg by mouth daily.    . metoCLOPramide (REGLAN) 5 MG tablet Take 5 mg by mouth 2 (two) times daily.     . Multiple Vitamins-Calcium (DAILY VITAMINS FOR WOMEN PO) Take 1 tablet by mouth daily.    . Multiple Vitamins-Minerals (MULTIVITAMIN WITH MINERALS) tablet Take 1 tablet by mouth daily.      . pantoprazole (PROTONIX) 40  MG tablet Take 1 tablet (40 mg total) by mouth daily at 6 (six) AM. 30 tablet 0  . predniSONE (DELTASONE) 20 MG tablet Prednisone 40 mg daily for 3 days followed by  Prednisone 20 mg daily for 3 days. 9 tablet 0  . raloxifene (EVISTA) 60 MG tablet Take 60 mg by mouth daily.    . sertraline (ZOLOFT) 100 MG tablet Take 100 mg by mouth daily.    Marland Kitchen tiotropium (SPIRIVA) 18 MCG inhalation capsule Place 18 mcg into inhaler and inhale daily.      Marland Kitchen guaiFENesin-dextromethorphan (ROBITUSSIN DM) 100-10 MG/5ML syrup Take 5 mLs by mouth every 4 (four) hours as needed for cough. (Patient not taking:  Reported on 05/05/2015) 118 mL 0   No current facility-administered medications for this visit.    Functional Status:   In your present state of health, do you have any difficulty performing the following activities: 05/05/2015 04/27/2015  Hearing? Y N  Vision? Y N  Difficulty concentrating or making decisions? N N  Walking or climbing stairs? N N  Dressing or bathing? N N  Doing errands, shopping? N N  Preparing Food and eating ? Y -  Using the Toilet? N -  In the past six months, have you accidently leaked urine? Y -  Do you have problems with loss of bowel control? N -  Managing your Medications? N -  Managing your Finances? N -  Housekeeping or managing your Housekeeping? Y -    Fall/Depression Screening:    PHQ 2/9 Scores 05/05/2015 05/02/2015  PHQ - 2 Score 6 0  PHQ- 9 Score 12 -   Fall Risk  05/05/2015  Falls in the past year? No    Assessment: Co-visit with [facility] RN, CM.  Pt pleasant, lively alert elderly pt. Home environment noted to have several possible irritants for her COPD such as second hand smoke form family members,  mold/mildew on the inside walls of her cinder block block home and pt has 3 dogs which stay in the home. Pt's niece states they do clean the walls with bleach occasionally to remove mold.   RNCM did med review with pt's medication bottles, pt able to state purposes of most of her medications by looking at the actual pill. Pt had some medications present in the home which were duplicate types of medications and also nebulizer solutions that were expired. RNCM educated pt on the purposes and doses of nebulizer solutions and inhalers. RNCM educated pt on COPD zones and purse lipped breathing. Pt receptive to learning and was able to demonstrate purse lip breathing technique. RNCM educated pt on the difference between her long acting inhalers and short acting rescue inhaler and nebulizer. Pt voiced understanding. RNCM also wrote on the nebulizer box  more information for pt's better understanding.   Pt had not heard anything from [facility] so RNCM called [facility] to check on the status of home health order. [facility] stated to this RNCM they had not received the order and requested I call the discharge planning number provided by the hospital to see if order could be sent. RNCM called # on the pt's discharge instructions and gave this message to the ward secretary to pass along to the charge nurse. RNCM received a call from [facility] liaison that [facility] would be contacting pt this afternoon and starting services tomorrow.   Plan: RNCM will follow up with pt next week as part of the transition  of care program.  RNCM will f/u with pt this week to ensure [facility] has been in contact.   Janci Minor RN, BSN  [facility] Care Management [phone])  THN CM Care Plan Problem One        Most Recent Value   Care Plan Problem One  Knowledge deficit of COPD as evidenced by recent hospitalizaion   Role Documenting the Problem One  Care Management Coordinator   Care Plan for Problem One  Active   THN Long Term Goal (31-90 days)  Pt will not be admitted to hosp with COPD exacerbation in the next 31 days.   THN Long Term Goal Start Date  04/30/15   Interventions for Problem One Long Term Goal  Transition of care program started. Home visit scheduled.    THN CM Short Term Goal #1 (0-30 days)  Pt will obtain prescribed prednisone in the 7 days.   THN CM Short Term Goal #1 Start Date  04/30/15   [facility] CM Short Term Goal #1 Met Date  05/01/15   Interventions for Short Term Goal #1  RNCM did medication review with pt. Pt did not have prednisone. RNCM called pt pharmacy to inquire if pt's medication was there. RNCM called pt back  and educated her on needeing to get this prescription filled.    THN CM Short Term Goal #2 (0-30 days)  Pt will follow up with primary care MD in the next 14 days   THN CM Short Term Goal #2 Start Date   04/30/15   Interventions for Short Term Goal #2  RNCM educated pt on the importance of following up with primary care provider after recent hospitalization. RNCM confirmed with pt she had scheduled a f/u appointment.   THN CM Short Term Goal #3 (0-30 days)  Pt will fill and use provided pill box organizer for the next 30 days.   THN CM Short Term Goal #3 Start Date  05/05/15   Interventions for Short Tern Goal #3  RNCM provided pill box, RNCM discussed with pt and neice the importance of not skipping doses of medications. RNCM enlisted the assistance of pt's neice to help with filling pill box.

## 2015-05-06 DIAGNOSIS — J441 Chronic obstructive pulmonary disease with (acute) exacerbation: Secondary | ICD-10-CM | POA: Diagnosis not present

## 2015-05-06 DIAGNOSIS — G8929 Other chronic pain: Secondary | ICD-10-CM | POA: Diagnosis not present

## 2015-05-06 DIAGNOSIS — M810 Age-related osteoporosis without current pathological fracture: Secondary | ICD-10-CM | POA: Diagnosis not present

## 2015-05-06 DIAGNOSIS — M545 Low back pain: Secondary | ICD-10-CM | POA: Diagnosis not present

## 2015-05-06 DIAGNOSIS — E785 Hyperlipidemia, unspecified: Secondary | ICD-10-CM | POA: Diagnosis not present

## 2015-05-06 DIAGNOSIS — F419 Anxiety disorder, unspecified: Secondary | ICD-10-CM | POA: Diagnosis not present

## 2015-05-06 DIAGNOSIS — M1990 Unspecified osteoarthritis, unspecified site: Secondary | ICD-10-CM | POA: Diagnosis not present

## 2015-05-06 DIAGNOSIS — I10 Essential (primary) hypertension: Secondary | ICD-10-CM | POA: Diagnosis not present

## 2015-05-06 DIAGNOSIS — F329 Major depressive disorder, single episode, unspecified: Secondary | ICD-10-CM | POA: Diagnosis not present

## 2015-05-07 DIAGNOSIS — J9600 Acute respiratory failure, unspecified whether with hypoxia or hypercapnia: Secondary | ICD-10-CM | POA: Diagnosis not present

## 2015-05-07 DIAGNOSIS — E876 Hypokalemia: Secondary | ICD-10-CM | POA: Diagnosis not present

## 2015-05-07 DIAGNOSIS — E7800 Pure hypercholesterolemia, unspecified: Secondary | ICD-10-CM | POA: Diagnosis not present

## 2015-05-07 DIAGNOSIS — N183 Chronic kidney disease, stage 3 (moderate): Secondary | ICD-10-CM | POA: Diagnosis not present

## 2015-05-07 DIAGNOSIS — J449 Chronic obstructive pulmonary disease, unspecified: Secondary | ICD-10-CM | POA: Diagnosis not present

## 2015-05-07 DIAGNOSIS — I10 Essential (primary) hypertension: Secondary | ICD-10-CM | POA: Diagnosis not present

## 2015-05-07 DIAGNOSIS — J441 Chronic obstructive pulmonary disease with (acute) exacerbation: Secondary | ICD-10-CM | POA: Diagnosis not present

## 2015-05-08 ENCOUNTER — Other Ambulatory Visit: Payer: Self-pay | Admitting: *Deleted

## 2015-05-08 NOTE — Patient Outreach (Signed)
RNCM called pt to confirm Home Health had started with pt. Pt stating she was using the pill organizer provided by [facility] Pt stated she had her f/u appointment with her PCP and he was pleased with her lungs and how she was doing. She stated the MD did not change anything. Discussed with pt the COPD zones and pt stated home health had also talked with her about the zones. Pt stated she was in the green zone today. Pt stated her main problem was trying to sign up for medications through the mail, but she stated she had enough medications right now. RNCM let pt know she would be following up with her next week as part of TOC program.   Plan: RNCM will call pt next week as part of [place] program.   Merlene Morse Minor RN, BSN  [place] Management [phone])

## 2015-05-12 DIAGNOSIS — G8929 Other chronic pain: Secondary | ICD-10-CM | POA: Diagnosis not present

## 2015-05-12 DIAGNOSIS — M810 Age-related osteoporosis without current pathological fracture: Secondary | ICD-10-CM | POA: Diagnosis not present

## 2015-05-12 DIAGNOSIS — I10 Essential (primary) hypertension: Secondary | ICD-10-CM | POA: Diagnosis not present

## 2015-05-12 DIAGNOSIS — M545 Low back pain: Secondary | ICD-10-CM | POA: Diagnosis not present

## 2015-05-12 DIAGNOSIS — M1990 Unspecified osteoarthritis, unspecified site: Secondary | ICD-10-CM | POA: Diagnosis not present

## 2015-05-12 DIAGNOSIS — J441 Chronic obstructive pulmonary disease with (acute) exacerbation: Secondary | ICD-10-CM | POA: Diagnosis not present

## 2015-05-12 DIAGNOSIS — F329 Major depressive disorder, single episode, unspecified: Secondary | ICD-10-CM | POA: Diagnosis not present

## 2015-05-12 DIAGNOSIS — E785 Hyperlipidemia, unspecified: Secondary | ICD-10-CM | POA: Diagnosis not present

## 2015-05-12 DIAGNOSIS — F419 Anxiety disorder, unspecified: Secondary | ICD-10-CM | POA: Diagnosis not present

## 2015-05-14 DIAGNOSIS — E785 Hyperlipidemia, unspecified: Secondary | ICD-10-CM | POA: Diagnosis not present

## 2015-05-14 DIAGNOSIS — M1990 Unspecified osteoarthritis, unspecified site: Secondary | ICD-10-CM | POA: Diagnosis not present

## 2015-05-14 DIAGNOSIS — M545 Low back pain: Secondary | ICD-10-CM | POA: Diagnosis not present

## 2015-05-14 DIAGNOSIS — F419 Anxiety disorder, unspecified: Secondary | ICD-10-CM | POA: Diagnosis not present

## 2015-05-14 DIAGNOSIS — M810 Age-related osteoporosis without current pathological fracture: Secondary | ICD-10-CM | POA: Diagnosis not present

## 2015-05-14 DIAGNOSIS — J441 Chronic obstructive pulmonary disease with (acute) exacerbation: Secondary | ICD-10-CM | POA: Diagnosis not present

## 2015-05-14 DIAGNOSIS — G8929 Other chronic pain: Secondary | ICD-10-CM | POA: Diagnosis not present

## 2015-05-14 DIAGNOSIS — I10 Essential (primary) hypertension: Secondary | ICD-10-CM | POA: Diagnosis not present

## 2015-05-14 DIAGNOSIS — F329 Major depressive disorder, single episode, unspecified: Secondary | ICD-10-CM | POA: Diagnosis not present

## 2015-05-15 ENCOUNTER — Other Ambulatory Visit: Payer: Self-pay | Admitting: *Deleted

## 2015-05-15 NOTE — Patient Outreach (Signed)
RNCM contacted pt for #3 transition of care call. Pt stated she was feeling well. RNCM discussed the COPD zones with pt. Pt stated she had one episode of SOB where she had to get up at night and use her nebulizer, but she said medication was helpful and she was able to go back to sleep. Pt stating if she gets out of breath she is using the purse lip breathing and if it continues she uses her nebulizer. Pt states she has been exercising by ambulating in her yard, resting when necessary. Pt stated she does cough occasionally and has white to yellow sputum, but has had no increase in sputum or cough and does not feel sick. Pt stated she has been using the pill organizer provided by [facility] and it has been very helpful. Pt stated she has signed up for [facility] mail order pharmacy and expects a delivery soon. Pt in good spirits, stating her home health nurse was continuing to check on her also and was extremely kind and sweet. Pt stated she was able to obtain a less heavy portable O2 container and was happier with the new system. Pt stated she was able to manage it much easier.  Plan: RNCM will call pt next week and see pt for f/u in 2 weeks. Rutherford Limerick RN, BSN  [facility] Care Management [phone])

## 2015-05-21 DIAGNOSIS — F329 Major depressive disorder, single episode, unspecified: Secondary | ICD-10-CM | POA: Diagnosis not present

## 2015-05-21 DIAGNOSIS — M545 Low back pain: Secondary | ICD-10-CM | POA: Diagnosis not present

## 2015-05-21 DIAGNOSIS — G8929 Other chronic pain: Secondary | ICD-10-CM | POA: Diagnosis not present

## 2015-05-21 DIAGNOSIS — E785 Hyperlipidemia, unspecified: Secondary | ICD-10-CM | POA: Diagnosis not present

## 2015-05-21 DIAGNOSIS — I10 Essential (primary) hypertension: Secondary | ICD-10-CM | POA: Diagnosis not present

## 2015-05-21 DIAGNOSIS — M810 Age-related osteoporosis without current pathological fracture: Secondary | ICD-10-CM | POA: Diagnosis not present

## 2015-05-21 DIAGNOSIS — F419 Anxiety disorder, unspecified: Secondary | ICD-10-CM | POA: Diagnosis not present

## 2015-05-21 DIAGNOSIS — M1990 Unspecified osteoarthritis, unspecified site: Secondary | ICD-10-CM | POA: Diagnosis not present

## 2015-05-21 DIAGNOSIS — J441 Chronic obstructive pulmonary disease with (acute) exacerbation: Secondary | ICD-10-CM | POA: Diagnosis not present

## 2015-05-29 ENCOUNTER — Other Ambulatory Visit: Payer: Self-pay | Admitting: *Deleted

## 2015-05-29 ENCOUNTER — Encounter: Payer: Self-pay | Admitting: *Deleted

## 2015-05-29 DIAGNOSIS — E785 Hyperlipidemia, unspecified: Secondary | ICD-10-CM | POA: Diagnosis not present

## 2015-05-29 DIAGNOSIS — N390 Urinary tract infection, site not specified: Secondary | ICD-10-CM | POA: Diagnosis not present

## 2015-05-29 DIAGNOSIS — J449 Chronic obstructive pulmonary disease, unspecified: Secondary | ICD-10-CM | POA: Diagnosis not present

## 2015-05-29 DIAGNOSIS — R5381 Other malaise: Secondary | ICD-10-CM | POA: Diagnosis not present

## 2015-05-29 DIAGNOSIS — I10 Essential (primary) hypertension: Secondary | ICD-10-CM | POA: Diagnosis not present

## 2015-05-29 NOTE — Patient Outreach (Signed)
[name]) Care Management   05/29/2015  [name] 02-04-1941 [id number]  [name] is an 74 y.o. female  Subjective: "I have not been feeling well for the last few days. My pee is cloudy and smells really bad." "I have had to pull over and take my inhalers because I have been getting short of breath driving."   Objective: Blood pressure 122/68, pulse 80, resp. rate 24, height 1.549 m ('5\' 1"' ), weight 162 lb (73.483 kg), SpO2 95 %.  Review of Systems  Constitutional: Positive for malaise/fatigue.  HENT: Positive for congestion.   Respiratory: Positive for cough, shortness of breath and wheezing.   Cardiovascular: Positive for orthopnea.  Musculoskeletal: Positive for back pain. Negative for falls.  Neurological: Positive for dizziness.       When getting up in the last two weeks.    Physical Exam  Constitutional: She is oriented to person, place, and time. She appears well-developed and well-nourished.  Cardiovascular: Normal rate, regular rhythm and normal heart sounds.   Pulses:      Dorsalis pedis pulses are 2+ on the right side, and 2+ on the left side.  Respiratory: She has decreased breath sounds in the right lower field and the left lower field. She has wheezes. She has rhonchi in the right middle field. She has rales.  GI: Soft. Bowel sounds are normal.  Genitourinary:  Pt complaining of cloudy foul smelling urine.   Musculoskeletal: Normal range of motion.  Neurological: She is alert and oriented to person, place, and time.  Skin: Skin is warm, dry and intact.  Psychiatric: She has a normal mood and affect. Her speech is normal and behavior is normal. Judgment and thought content normal. Cognition and memory are normal.    Current Medications:   Current Outpatient Prescriptions  Medication Sig Dispense Refill  . albuterol (PROVENTIL HFA;VENTOLIN HFA) 108 (90 BASE) MCG/ACT inhaler Inhale 2 puffs into the lungs every 6 (six) hours as needed.  18 g 1  . albuterol (PROVENTIL) (2.5 MG/3ML) 0.083% nebulizer solution Take 3 mLs (2.5 mg total) by nebulization every 2 (two) hours as needed for wheezing or shortness of breath. 75 mL 12  . amLODipine (NORVASC) 2.5 MG tablet Take 5 mg by mouth daily.    Marland Kitchen atorvastatin (LIPITOR) 20 MG tablet Take 20 mg by mouth at bedtime.    . benzonatate (TESSALON) 200 MG capsule Take 1 capsule (200 mg total) by mouth 3 (three) times daily as needed for cough. 20 capsule 0  . diazepam (VALIUM) 5 MG tablet Take 2.5 mg by mouth at bedtime as needed for anxiety (for nerves).     . gabapentin (NEURONTIN) 100 MG capsule Take 100 mg by mouth 3 (three) times daily.    Marland Kitchen guaiFENesin-dextromethorphan (ROBITUSSIN DM) 100-10 MG/5ML syrup Take 5 mLs by mouth every 4 (four) hours as needed for cough. 118 mL 0  . HYDROcodone-acetaminophen (NORCO) 5-325 MG per tablet Take 1 tablet by mouth 2 (two) times daily as needed for moderate pain.     Marland Kitchen lisinopril (PRINIVIL,ZESTRIL) 30 MG tablet Take 30 mg by mouth every morning.    . metoCLOPramide (REGLAN) 5 MG tablet Take 5 mg by mouth 2 (two) times daily.     . pantoprazole (PROTONIX) 40 MG tablet Take 1 tablet (40 mg total) by mouth daily at 6 (six) AM. 30 tablet 0  . raloxifene (EVISTA) 60 MG tablet Take 60 mg by mouth daily.    . sertraline (ZOLOFT)  100 MG tablet Take 100 mg by mouth daily.    . Calcium-Magnesium-Vitamin D (CALCIUM 1200+D3 PO) Take 2 tablets by mouth daily.    Marland Kitchen ipratropium-albuterol (DUONEB) 0.5-2.5 (3) MG/3ML SOLN Take 3 mLs by nebulization every 6 (six) hours as needed. (Patient not taking: Reported on 05/29/2015) 360 mL 2  . magnesium oxide (MAG-OX) 400 MG tablet Take 500 mg by mouth daily.    . Multiple Vitamins-Calcium (DAILY VITAMINS FOR WOMEN PO) Take 1 tablet by mouth daily.    . Multiple Vitamins-Minerals (MULTIVITAMIN WITH MINERALS) tablet Take 1 tablet by mouth daily.      . predniSONE (DELTASONE) 20 MG tablet Prednisone 40 mg daily for 3 days  followed by  Prednisone 20 mg daily for 3 days. (Patient not taking: Reported on 05/29/2015) 9 tablet 0  . tiotropium (SPIRIVA) 18 MCG inhalation capsule Place 18 mcg into inhaler and inhale daily.       No current facility-administered medications for this visit.    Functional Status:   In your present state of health, do you have any difficulty performing the following activities: 05/05/2015 04/27/2015  Hearing? Y N  Vision? Y N  Difficulty concentrating or making decisions? N N  Walking or climbing stairs? N N  Dressing or bathing? N N  Doing errands, shopping? N N  Preparing Food and eating ? Y -  Using the Toilet? N -  In the past six months, have you accidently leaked urine? Y -  Do you have problems with loss of bowel control? N -  Managing your Medications? N -  Managing your Finances? N -  Housekeeping or managing your Housekeeping? Y -    Fall/Depression Screening:    PHQ 2/9 Scores 05/05/2015 05/02/2015  PHQ - 2 Score 6 0  PHQ- 9 Score 12 -    Fall Risk  05/29/2015 05/05/2015  Falls in the past year? No No    Assessment:  See physical assessment as above.   Pt c/o not feeling well over the last week. Pt noted to be audibly wheezing when RNCM arrived and RNCM encouraged pt to use nebulizer. RNCM called pt's primary care office and obtained pt an appointment for 3pm this afternoon. Pt stating she had been out of her spiriva for 1-2 days. Pt stated she would like to see a pulmonologist and that someone at the hospital told her she needed to. RNCM discussed with pt s/s of worsening COPD and when to call the doctor. Pt able to list several symptoms in the yellow zone she was experiencing today: increase cough, increase SOB, taking nebulizer 4+ times per day, increase wheezing, and increased fatigue. Pt denies having a flutter valve or incentive spirometer. RNCM wrote a note of pt's symptoms and request for refill of tessalon pearls and spiriva for pt to give to MD. Educated pt  on COPD triggers such as the mold and mildew on pt's walls. Pt stating she will request niece come and clean walls for her.   Pt successfully using pill organizer and [facility] mail order pharmacy. Pt had not received information on Extra Help application yet.    Plan: RNCM will see pt in 2 weeks 06/09/15 RNCM will call pt this afternoon to check on how MD visit went.   Janci Minor RN, BSN  [facility] Care Management [phone])  THN CM Care Plan Problem One        Most Recent Value   Care Plan Problem One  Knowledge deficit of COPD as  evidenced by recent hospitalizaion   Role Documenting the Problem One  Care Management [place] for Problem One  Active   THN Long Term Goal (31-90 days)  Pt will not be admitted to hosp with COPD exacerbation in the next 31 days.   THN Long Term Goal Start Date  04/30/15   Interventions for Problem One Long Term Goal  Transition of care program started. Home visit scheduled.    THN CM Short Term Goal #1 (0-30 days)  Pt will obtain prescribed prednisone in the 7 days.   THN CM Short Term Goal #1 Start Date  04/30/15   [facility] CM Short Term Goal #1 Met Date  05/01/15   Interventions for Short Term Goal #1  RNCM did medication review with pt. Pt did not have prednisone. RNCM called pt pharmacy to inquire if pt's medication was there. RNCM called pt back  and educated her on needeing to get this prescription filled.    THN CM Short Term Goal #2 (0-30 days)  Pt will follow up with primary care MD in the next 14 days   THN CM Short Term Goal #2 Start Date  04/30/15   [facility] CM Short Term Goal #2 Met Date  05/29/15   Interventions for Short Term Goal #2  RNCM educated pt on the importance of following up with primary care provider after recent hospitalization. RNCM confirmed with pt she had scheduled a f/u appointment.   THN CM Short Term Goal #3 (0-30 days)  Pt will fill and use provided pill box organizer for the next 30 days.   THN CM Short Term Goal #3  Start Date  05/05/15   [facility] CM Short Term Goal #3 Met Date  05/29/15   Interventions for Short Tern Goal #3  RNCM provided pill box, RNCM discussed with pt and neice the importance of not skipping doses of medications. RNCM enlisted the assistance of pt's neice to help with filling pill box.   THN CM Short Term Goal #4 (0-30 days)  Pt will request an appointment with a pulmonologist in the next 30 days    THN CM Short Term Goal #4 Start Date  05/29/15   Interventions for Short Term Goal #4  Pt stating she was told at discharge she needed to see a lung doctor, RNCM plans to see her PCP this afternoon so RNCM wrote a note for her to take with her requesting a referral to a pulmonologist.   THN CM Short Term Goal #5 (0-30 days)  Pt will request that her neice and care giver clean the mold from her walls in the next 30 days.    THN CM Short Term Goal #5 Start Date  05/29/15   Interventions for Short Term Goal #5  RNCM educated pt on the dangers of mold and mildew to pt's COPD

## 2015-06-01 ENCOUNTER — Other Ambulatory Visit: Payer: Self-pay | Admitting: *Deleted

## 2015-06-01 NOTE — Patient Outreach (Signed)
RNCM called pt to see how MD office visit scheduled 05/29/15 @ 3pm had gone. Pt stated she gave MD the note RNCM had made for her. Pt stated she was asked to "pee in a cup" and the doctor would let her know in a few days if she needed an antibiotic. Pt stated MD did not change any medications at this time. RNCM encouraged pt to call back Monday if she was still not feeling well and pt stated she would.    Plan: RNCM will call pt next week to f/u   Janci Minor RN, BSN  [facility] Care Management [phone])

## 2015-06-02 ENCOUNTER — Other Ambulatory Visit: Payer: Self-pay | Admitting: *Deleted

## 2015-06-02 NOTE — Patient Outreach (Signed)
RNCM called pt to see if respiratory symptoms had improved. Pt stated yes the MD had called her in some Tessalon Pearls and Prednisone. Pt had not heard back about urine culture results. RNCM encouraged pt to call about urine culture tomorrow.  Plan: RNCM will see pt at regularly scheduled appt.  Rutherford Limerick RN, BSN  [facility] Care Management [phone])

## 2015-06-09 ENCOUNTER — Other Ambulatory Visit: Payer: Self-pay | Admitting: *Deleted

## 2015-06-09 NOTE — Patient Outreach (Signed)
[place] [facility]) Care Management   06/09/2015  [name] 1941-06-05 [id number]  [name] is an 74 y.o. female  Subjective: "I have improved since I went to the doctor but I am still taking my inhaler or nebulizer a lot during the day. I take my nebulizer 3 times and my inhaler 4 times." "I would like to see a doctor who specializes in lungs." "I have been trying to clean the mold off my walls."  Objective: Blood pressure 120/75, pulse 78, resp. rate 18, height 1.549 m (_0 ), weight 160 lb (72.576 kg), SpO2 97 %.  Review of Systems  Respiratory: Positive for wheezing.   Musculoskeletal: Negative for falls.    Physical Exam  Constitutional: She is oriented to person, place, and time. She appears well-developed and well-nourished. She is active.  Neck: Normal range of motion.  Cardiovascular: Normal rate and regular rhythm.   Respiratory: Effort normal. She has wheezes in the right middle field and the left middle field.  GI: Soft. Bowel sounds are normal.  Genitourinary:  Denies frequency, burning, malodorous urine, or cloudy urine.   Musculoskeletal: Normal range of motion.  Neurological: She is alert and oriented to person, place, and time.  Skin: Skin is warm, dry and intact.  Psychiatric: She has a normal mood and affect. Her speech is normal and behavior is normal. Judgment and thought content normal. Cognition and memory are normal.    Current Medications:   Current Outpatient Prescriptions  Medication Sig Dispense Refill  . albuterol (PROVENTIL HFA;VENTOLIN HFA) 108 (90 BASE) MCG/ACT inhaler Inhale 2 puffs into the lungs every 6 (six) hours as needed. 18 g 1  . albuterol (PROVENTIL) (2.5 MG/3ML) 0.083% nebulizer solution Take 3 mLs (2.5 mg total) by nebulization every 2 (two) hours as needed for wheezing or shortness of breath. 75 mL 12  . amLODipine (NORVASC) 2.5 MG tablet Take 5 mg by mouth daily.    Marland Kitchen atorvastatin (LIPITOR) 20 MG tablet Take 20  mg by mouth at bedtime.    . benzonatate (TESSALON) 200 MG capsule Take 1 capsule (200 mg total) by mouth 3 (three) times daily as needed for cough. 20 capsule 0  . Calcium-Magnesium-Vitamin D (CALCIUM 1200+D3 PO) Take 2 tablets by mouth daily.    . diazepam (VALIUM) 5 MG tablet Take 2.5 mg by mouth at bedtime as needed for anxiety (for nerves).     . gabapentin (NEURONTIN) 100 MG capsule Take 100 mg by mouth 3 (three) times daily.    Marland Kitchen guaiFENesin-dextromethorphan (ROBITUSSIN DM) 100-10 MG/5ML syrup Take 5 mLs by mouth every 4 (four) hours as needed for cough. 118 mL 0  . HYDROcodone-acetaminophen (NORCO) 5-325 MG per tablet Take 1 tablet by mouth 2 (two) times daily as needed for moderate pain.     Marland Kitchen lisinopril (PRINIVIL,ZESTRIL) 30 MG tablet Take 30 mg by mouth every morning.    . magnesium oxide (MAG-OX) 400 MG tablet Take 500 mg by mouth daily.    . metoCLOPramide (REGLAN) 5 MG tablet Take 5 mg by mouth 2 (two) times daily.     . Multiple Vitamins-Calcium (DAILY VITAMINS FOR WOMEN PO) Take 1 tablet by mouth daily.    . Multiple Vitamins-Minerals (MULTIVITAMIN WITH MINERALS) tablet Take 1 tablet by mouth daily.      . pantoprazole (PROTONIX) 40 MG tablet Take 1 tablet (40 mg total) by mouth daily at 6 (six) AM. 30 tablet 0  . ipratropium-albuterol (DUONEB) 0.5-2.5 (3) MG/3ML SOLN Take  3 mLs by nebulization every 6 (six) hours as needed. (Patient not taking: Reported on 05/29/2015) 360 mL 2  . predniSONE (DELTASONE) 20 MG tablet Prednisone 40 mg daily for 3 days followed by  Prednisone 20 mg daily for 3 days. (Patient not taking: Reported on 05/29/2015) 9 tablet 0  . raloxifene (EVISTA) 60 MG tablet Take 60 mg by mouth daily.    . sertraline (ZOLOFT) 100 MG tablet Take 100 mg by mouth daily.    Marland Kitchen tiotropium (SPIRIVA) 18 MCG inhalation capsule Place 18 mcg into inhaler and inhale daily.       No current facility-administered medications for this visit.    Functional Status:   In your present  state of health, do you have any difficulty performing the following activities: 05/05/2015 04/27/2015  Hearing? Y N  Vision? Y N  Difficulty concentrating or making decisions? N N  Walking or climbing stairs? N N  Dressing or bathing? N N  Doing errands, shopping? N N  Preparing Food and eating ? Y -  Using the Toilet? N -  In the past six months, have you accidently leaked urine? Y -  Do you have problems with loss of bowel control? N -  Managing your Medications? N -  Managing your Finances? N -  Housekeeping or managing your Housekeeping? Y -    Fall/Depression Screening:    PHQ 2/9 Scores 05/05/2015 05/02/2015  PHQ - 2 Score 6 0  PHQ- 9 Score 12 -   Fall Risk  05/29/2015 05/05/2015  Falls in the past year? No No    Assessment:  RNCM assisted pt with proper technique with inhaler. Showed pt a video on using inhaler correctly.  And pt demonstrated proper technique. Pt stated she had been doing two quick pumps and not holding her breath or waiting between pumps.   Lungs with scattered wheezes in bilateral middle lobes. Denies increased SOB or coughing up colored mucus. Continues to use 02 at night. COPD zones reviewed. Pt stating she has been using advair and rinsing her mouth after use. Pt had been in the process of cleaning walls and attempting to remove mold off her walls to reduce triggers of COPD.  All other systems negative.   Pt given information on med alert related to pt lives alone and nearby relatives had to move away. Pt stated she would look into it. Next RNCM appt scheduled. RNCM talked with pt about discharging pt next visit related to goals met.   Plan: RNCM will see pt next month to monitor progress, will d/c if goals met.   Rutherford Limerick RN, BSN  [facility] Care Management [phone])

## 2015-06-10 ENCOUNTER — Ambulatory Visit: Payer: Commercial Managed Care - HMO | Admitting: *Deleted

## 2015-06-11 DIAGNOSIS — F329 Major depressive disorder, single episode, unspecified: Secondary | ICD-10-CM | POA: Diagnosis not present

## 2015-06-11 DIAGNOSIS — I10 Essential (primary) hypertension: Secondary | ICD-10-CM | POA: Diagnosis not present

## 2015-06-11 DIAGNOSIS — M810 Age-related osteoporosis without current pathological fracture: Secondary | ICD-10-CM | POA: Diagnosis not present

## 2015-06-11 DIAGNOSIS — E785 Hyperlipidemia, unspecified: Secondary | ICD-10-CM | POA: Diagnosis not present

## 2015-06-11 DIAGNOSIS — M545 Low back pain: Secondary | ICD-10-CM | POA: Diagnosis not present

## 2015-06-11 DIAGNOSIS — F419 Anxiety disorder, unspecified: Secondary | ICD-10-CM | POA: Diagnosis not present

## 2015-06-11 DIAGNOSIS — M1990 Unspecified osteoarthritis, unspecified site: Secondary | ICD-10-CM | POA: Diagnosis not present

## 2015-06-11 DIAGNOSIS — J441 Chronic obstructive pulmonary disease with (acute) exacerbation: Secondary | ICD-10-CM | POA: Diagnosis not present

## 2015-06-11 DIAGNOSIS — G8929 Other chronic pain: Secondary | ICD-10-CM | POA: Diagnosis not present

## 2015-06-17 ENCOUNTER — Other Ambulatory Visit: Payer: Self-pay | Admitting: Pharmacist

## 2015-06-17 NOTE — Patient Outreach (Signed)
Called to follow up with Ms. Burandt regarding the application for Extra Help that she completed over the phone with Care Management Assistance Damita Rhodie in October. Ms. Qadri states that she is not sure if she ever received a response in the mail about this. Ask Ms. Bernick for information from her red,white and blue Medicare card and look this up for her on the Medicare website. Per Medicare.gov the patient now has Full Extra Help on her medications as of October of this year. Patient states that she did notice her copays go down, but did not know why.   Patient states that she no longer needs assistance with affording her medications and that she has no medication questions for me at this time. Confirm that patient has my contact information. Will let Care Management Assistant Damita Rhodie know that the patient has received the Extra Help and will stop following Ms. Marschall at this time.  [name], PharmD Clinical Pharmacist [place] Management [phone]

## 2015-06-25 NOTE — Patient Outreach (Signed)
[facility]) Care Management  06/25/2015  QUNISHA BRYK May 25, 1941 [id number]   Notification received from [name], PharmD that patient was approved for full subsidy for extra help through social security for her medication prescription costs. There is no further assistance needed, therefore I am removing myself from the care team.  Damita L. Rhodie, [place] Care Management Assistant

## 2015-06-28 DIAGNOSIS — J449 Chronic obstructive pulmonary disease, unspecified: Secondary | ICD-10-CM | POA: Diagnosis not present

## 2015-06-28 DIAGNOSIS — I10 Essential (primary) hypertension: Secondary | ICD-10-CM | POA: Diagnosis not present

## 2015-06-28 DIAGNOSIS — R5381 Other malaise: Secondary | ICD-10-CM | POA: Diagnosis not present

## 2015-06-28 DIAGNOSIS — E785 Hyperlipidemia, unspecified: Secondary | ICD-10-CM | POA: Diagnosis not present

## 2015-07-01 DIAGNOSIS — J449 Chronic obstructive pulmonary disease, unspecified: Secondary | ICD-10-CM | POA: Diagnosis not present

## 2015-07-01 DIAGNOSIS — I10 Essential (primary) hypertension: Secondary | ICD-10-CM | POA: Diagnosis not present

## 2015-07-01 DIAGNOSIS — K219 Gastro-esophageal reflux disease without esophagitis: Secondary | ICD-10-CM | POA: Diagnosis not present

## 2015-07-01 DIAGNOSIS — N183 Chronic kidney disease, stage 3 (moderate): Secondary | ICD-10-CM | POA: Diagnosis not present

## 2015-07-01 DIAGNOSIS — M810 Age-related osteoporosis without current pathological fracture: Secondary | ICD-10-CM | POA: Diagnosis not present

## 2015-07-01 DIAGNOSIS — F322 Major depressive disorder, single episode, severe without psychotic features: Secondary | ICD-10-CM | POA: Diagnosis not present

## 2015-07-01 DIAGNOSIS — K3184 Gastroparesis: Secondary | ICD-10-CM | POA: Diagnosis not present

## 2015-07-07 ENCOUNTER — Other Ambulatory Visit: Payer: Self-pay | Admitting: *Deleted

## 2015-07-07 ENCOUNTER — Ambulatory Visit: Payer: Self-pay | Admitting: *Deleted

## 2015-07-07 VITALS — BP 120/68 | HR 68 | Resp 20

## 2015-07-07 DIAGNOSIS — J449 Chronic obstructive pulmonary disease, unspecified: Secondary | ICD-10-CM

## 2015-07-07 NOTE — Patient Outreach (Signed)
[name]) Care Management   07/07/2015  [name] [birth year] [id number]  [name] is an 74 y.o. female  Subjective: "I want to get a lung specialist but I can't do it until January." "I would still like more review of the COPD zones."  Objective:   Blood pressure 120/68, pulse 68, resp. rate 20, SpO2 96 %. Review of Systems  Respiratory: Positive for wheezing.   All other systems reviewed and are negative.   Physical Exam  Constitutional: She is oriented to person, place, and time. She appears well-developed and well-nourished.  Cardiovascular: Normal rate, regular rhythm and normal heart sounds.   Respiratory: Effort normal. She has wheezes in the right upper field and the left upper field.  Musculoskeletal: Normal range of motion.  Neurological: She is alert and oriented to person, place, and time.  Skin: Skin is warm and dry.    Current Medications:   Current Outpatient Prescriptions  Medication Sig Dispense Refill  . albuterol (PROVENTIL HFA;VENTOLIN HFA) 108 (90 BASE) MCG/ACT inhaler Inhale 2 puffs into the lungs every 6 (six) hours as needed. 18 g 1  . albuterol (PROVENTIL) (2.5 MG/3ML) 0.083% nebulizer solution Take 3 mLs (2.5 mg total) by nebulization every 2 (two) hours as needed for wheezing or shortness of breath. 75 mL 12  . amLODipine (NORVASC) 2.5 MG tablet Take 5 mg by mouth daily.    Marland Kitchen atorvastatin (LIPITOR) 20 MG tablet Take 20 mg by mouth at bedtime.    . benzonatate (TESSALON) 200 MG capsule Take 1 capsule (200 mg total) by mouth 3 (three) times daily as needed for cough. 20 capsule 0  . Calcium-Magnesium-Vitamin D (CALCIUM 1200+D3 PO) Take 2 tablets by mouth daily.    . diazepam (VALIUM) 5 MG tablet Take 2.5 mg by mouth at bedtime as needed for anxiety (for nerves).     . gabapentin (NEURONTIN) 100 MG capsule Take 100 mg by mouth 3 (three) times daily.    Marland Kitchen guaiFENesin-dextromethorphan (ROBITUSSIN DM) 100-10 MG/5ML syrup Take  5 mLs by mouth every 4 (four) hours as needed for cough. 118 mL 0  . HYDROcodone-acetaminophen (NORCO) 5-325 MG per tablet Take 1 tablet by mouth 2 (two) times daily as needed for moderate pain.     Marland Kitchen ipratropium-albuterol (DUONEB) 0.5-2.5 (3) MG/3ML SOLN Take 3 mLs by nebulization every 6 (six) hours as needed. (Patient not taking: Reported on 05/29/2015) 360 mL 2  . lisinopril (PRINIVIL,ZESTRIL) 30 MG tablet Take 30 mg by mouth every morning.    . magnesium oxide (MAG-OX) 400 MG tablet Take 500 mg by mouth daily.    . metoCLOPramide (REGLAN) 5 MG tablet Take 5 mg by mouth 2 (two) times daily.     . Multiple Vitamins-Calcium (DAILY VITAMINS FOR WOMEN PO) Take 1 tablet by mouth daily.    . Multiple Vitamins-Minerals (MULTIVITAMIN WITH MINERALS) tablet Take 1 tablet by mouth daily.      . pantoprazole (PROTONIX) 40 MG tablet Take 1 tablet (40 mg total) by mouth daily at 6 (six) AM. 30 tablet 0  . predniSONE (DELTASONE) 20 MG tablet Prednisone 40 mg daily for 3 days followed by  Prednisone 20 mg daily for 3 days. (Patient not taking: Reported on 05/29/2015) 9 tablet 0  . raloxifene (EVISTA) 60 MG tablet Take 60 mg by mouth daily.    . sertraline (ZOLOFT) 100 MG tablet Take 100 mg by mouth daily.    Marland Kitchen tiotropium (SPIRIVA) 18 MCG inhalation capsule Place 18  mcg into inhaler and inhale daily.       No current facility-administered medications for this visit.    Functional Status:   In your present state of health, do you have any difficulty performing the following activities: 05/05/2015 04/27/2015  Hearing? Y N  Vision? Y N  Difficulty concentrating or making decisions? N N  Walking or climbing stairs? N N  Dressing or bathing? N N  Doing errands, shopping? N N  Preparing Food and eating ? Y -  Using the Toilet? N -  In the past six months, have you accidently leaked urine? Y -  Do you have problems with loss of bowel control? N -  Managing your Medications? N -  Managing your Finances? N -   Housekeeping or managing your Housekeeping? Y -    Fall/Depression Screening:    PHQ 2/9 Scores 05/05/2015 05/02/2015  PHQ - 2 Score 6 0  PHQ- 9 Score 12 -    Assessment: [name] alert and oriented x3. Pleasant talkative [name]. Home with mold/mildew noted to walls. Niece in the process of cleaning it off when she has time according to [name]. [name] has two dogs inside the home.   COPD: [name]'s lungs with faint wheezes to bilat upper lobes. [name] stating she is using nebulizer BID and rescue inhaler as needed. Demonstrates good technique with purse-lip breathing. [name] stated she is using zones information to check her symptoms, but noted to need further review. [name] denies increased SOB, discolored sputum, or inability to rest at night. [name] continues to use 02 at night and with exertion as needed. [name] requesting to see a lung specialist but related to finances could not pay the co-pay til after the new year. [name] also discussed needing a smaller portable 02 system.  Safety: RNCM discussed with [name] Med Alert through the Hernandez to use for safety related to [name] lives alone. Med alert brochure given to [name] so she could call when ready. [name] stating her funds were limited at this time.   RNCM discussed with [name] being transferred to Health Coach to further her knowledge of COPD. [name] in agreement with this plan feeling she continued to need education and support with COPD.  Plan: RNCM will discharge [name] from care management and transfer to [place] coach. RNCM will contact [name]'s primary care MD and make him aware of [name]'s request for a pulmonologist. RNCM will also request from primary care MD [name] be evaluated for a small portable 02 concentrator.  Felicia Minor RN, BSN  [facility] Care Management [phone])  THN CM Care Plan Problem One        Most Recent Value   Care Plan Problem One  Knowledge deficit of COPD as evidenced by recent hospitalizaion   Role Documenting the Problem One  Care Management Coordinator   Care Plan for  Problem One  Active   THN Long Term Goal (31-90 days)  [name] will not be admitted to hosp with COPD exacerbation in the next 31 days.   THN Long Term Goal Start Date  04/30/15   THN Long Term Goal Met Date  06/01/15   Interventions for Problem One Long Term Goal  Transition of care program started. Home visit scheduled.    THN CM Short Term Goal #1 (0-30 days)  [name] will obtain prescribed prednisone in the 7 days.   THN CM Short Term Goal #1 Start Date  04/30/15   [facility] CM Short Term Goal #1 Met Date  05/01/15  Interventions for Short Term Goal #1  RNCM did medication review with [name]. [name] did not have prednisone. RNCM called [name] pharmacy to inquire if [name]'s medication was there. RNCM called [name] back  and educated her on needeing to get this prescription filled.    THN CM Short Term Goal #2 (0-30 days)  [name] will follow up with primary care MD in the next 14 days   THN CM Short Term Goal #2 Start Date  04/30/15   [facility] CM Short Term Goal #2 Met Date  05/29/15   Interventions for Short Term Goal #2  RNCM educated [name] on the importance of following up with primary care provider after recent hospitalization. RNCM confirmed with [name] she had scheduled a f/u appointment.   THN CM Short Term Goal #3 (0-30 days)  [name] will fill and use provided pill box organizer for the next 30 days.   THN CM Short Term Goal #3 Start Date  05/05/15   [facility] CM Short Term Goal #3 Met Date  05/29/15   Interventions for Short Tern Goal #3  RNCM provided pill box, RNCM discussed with [name] and neice the importance of not skipping doses of medications. RNCM enlisted the assistance of [name]'s neice to help with filling pill box.   THN CM Short Term Goal #4 (0-30 days)  [name] will request an appointment with a pulmonologist in the next 30 days    THN CM Short Term Goal #4 Start Date  06/09/15 [[name] did not meet this goal.]   Interventions for Short Term Goal #4  [name] stating she was told at discharge she needed to see a lung doctor, RNCM plans to see her PCP this  afternoon so RNCM wrote a note for her to take with her requesting a referral to a pulmonologist.   THN CM Short Term Goal #5 (0-30 days)  [name] will request that her neice and care giver clean the mold from her walls in the next 30 days.    THN CM Short Term Goal #5 Start Date  06/09/15   [facility] CM Short Term Goal #5 Met Date  07/07/15 [In process]   Interventions for Short Term Goal #5  RNCM educated [name] on the dangers of mold and mildew to [name]'s COPD

## 2015-07-13 ENCOUNTER — Encounter: Payer: Self-pay | Admitting: *Deleted

## 2015-07-13 ENCOUNTER — Other Ambulatory Visit: Payer: Self-pay | Admitting: *Deleted

## 2015-07-13 NOTE — Patient Outreach (Signed)
[facility]) Care Management  07/13/2015  CLINTON WAHLBERG Apr 12, 1941 [id number]  RN Health Coach telephone call to patient.  [place] compliance verified. Per patient she is just finishing her nebulizer treatments. Per patient she uses the oxygen as needed. Patient did get her flu shot done this year. Patient stated she seen her primary care Dr 1 1/2 weeks ago. Patient does know the zones and the action plan. Patient is agreeable to follow up outreach telephonic calls . Per patient she tries to stay on a low salt diet.   Assessment: This patient will benefit from Health Coach telephonic outreach for education and support for COPD self management Patient does know the zones and action plan but still needs reinforcement  Plan [place] will provide ongoing educational for the patient on COPD through phone calls and sending printed information for further discussion.  RN Health coach will send initial barriers letter, assessment , and care plan to primary care physician. RN Health coach will contact patient within a month for follow up outreach  [place] Management [phone]

## 2015-07-23 DIAGNOSIS — R509 Fever, unspecified: Secondary | ICD-10-CM | POA: Diagnosis not present

## 2015-07-23 DIAGNOSIS — J209 Acute bronchitis, unspecified: Secondary | ICD-10-CM | POA: Diagnosis not present

## 2015-07-29 DIAGNOSIS — R5381 Other malaise: Secondary | ICD-10-CM | POA: Diagnosis not present

## 2015-07-29 DIAGNOSIS — J449 Chronic obstructive pulmonary disease, unspecified: Secondary | ICD-10-CM | POA: Diagnosis not present

## 2015-07-29 DIAGNOSIS — E785 Hyperlipidemia, unspecified: Secondary | ICD-10-CM | POA: Diagnosis not present

## 2015-07-29 DIAGNOSIS — I10 Essential (primary) hypertension: Secondary | ICD-10-CM | POA: Diagnosis not present

## 2015-08-17 ENCOUNTER — Other Ambulatory Visit: Payer: Self-pay | Admitting: *Deleted

## 2015-08-17 ENCOUNTER — Encounter: Payer: Self-pay | Admitting: *Deleted

## 2015-08-17 ENCOUNTER — Ambulatory Visit: Payer: Self-pay | Admitting: *Deleted

## 2015-08-17 NOTE — Patient Outreach (Signed)
[facility]) Care Management  [place]  08/17/2015   [name] 1941/03/02 [id number]  Subjective: RN Health Coach telephone call to patient.  [employer] compliance verified. Patient stated she feels good. Per patient she is not wheezing but does have to wake up in the middle of the night and do a nebulizer treatment. Per patient she does have a portable O2 tank. Per patient she is exercising a little by walking around the park that is across the street. Patient states she is eating cereal in the morning and one other meal a day. Per patient her appetite is good.  Per patient she has received the information sent by the Health Coach. Patient is able to state the signs and symptoms of COPD. Patient is able to state the action plan. Patient has the magnets on the refrigerator to act as a reminder. Patient has agreed to follow up outreach call.   Objective:   Current Medications:  Current Outpatient Prescriptions  Medication Sig Dispense Refill  . albuterol (PROVENTIL HFA;VENTOLIN HFA) 108 (90 BASE) MCG/ACT inhaler Inhale 2 puffs into the lungs every 6 (six) hours as needed. 18 g 1  . albuterol (PROVENTIL) (2.5 MG/3ML) 0.083% nebulizer solution Take 3 mLs (2.5 mg total) by nebulization every 2 (two) hours as needed for wheezing or shortness of breath. 75 mL 12  . amLODipine (NORVASC) 2.5 MG tablet Take 5 mg by mouth daily.    Marland Kitchen atorvastatin (LIPITOR) 20 MG tablet Take 20 mg by mouth at bedtime.    . benzonatate (TESSALON) 200 MG capsule Take 1 capsule (200 mg total) by mouth 3 (three) times daily as needed for cough. 20 capsule 0  . Calcium-Magnesium-Vitamin D (CALCIUM 1200+D3 PO) Take 2 tablets by mouth daily.    . diazepam (VALIUM) 5 MG tablet Take 2.5 mg by mouth at bedtime as needed for anxiety (for nerves).     . gabapentin (NEURONTIN) 100 MG capsule Take 100 mg by mouth 3 (three) times daily.    Marland Kitchen guaiFENesin-dextromethorphan (ROBITUSSIN DM) 100-10 MG/5ML syrup  Take 5 mLs by mouth every 4 (four) hours as needed for cough. 118 mL 0  . HYDROcodone-acetaminophen (NORCO) 5-325 MG per tablet Take 1 tablet by mouth 2 (two) times daily as needed for moderate pain.     Marland Kitchen ipratropium-albuterol (DUONEB) 0.5-2.5 (3) MG/3ML SOLN Take 3 mLs by nebulization every 6 (six) hours as needed. (Patient not taking: Reported on 07/13/2015) 360 mL 2  . lisinopril (PRINIVIL,ZESTRIL) 30 MG tablet Take 30 mg by mouth every morning.    . magnesium oxide (MAG-OX) 400 MG tablet Take 500 mg by mouth daily.    . metoCLOPramide (REGLAN) 5 MG tablet Take 5 mg by mouth 2 (two) times daily.     . Multiple Vitamins-Calcium (DAILY VITAMINS FOR WOMEN PO) Take 1 tablet by mouth daily.    . Multiple Vitamins-Minerals (MULTIVITAMIN WITH MINERALS) tablet Take 1 tablet by mouth daily.      . pantoprazole (PROTONIX) 40 MG tablet Take 1 tablet (40 mg total) by mouth daily at 6 (six) AM. 30 tablet 0  . predniSONE (DELTASONE) 20 MG tablet Prednisone 40 mg daily for 3 days followed by  Prednisone 20 mg daily for 3 days. (Patient not taking: Reported on 07/13/2015) 9 tablet 0  . raloxifene (EVISTA) 60 MG tablet Take 60 mg by mouth daily.    . sertraline (ZOLOFT) 100 MG tablet Take 100 mg by mouth daily.    Marland Kitchen tiotropium (SPIRIVA)  18 MCG inhalation capsule Place 18 mcg into inhaler and inhale daily.       No current facility-administered medications for this visit.    Functional Status:  In your present state of health, do you have any difficulty performing the following activities: 08/17/2015 07/13/2015  Hearing? N N  Vision? N N  Difficulty concentrating or making decisions? N N  Walking or climbing stairs? N N  Dressing or bathing? - N  Doing errands, shopping? N N  Preparing Food and eating ? N -  Using the Toilet? N -  In the past six months, have you accidently leaked urine? N -  Do you have problems with loss of bowel control? N -  Managing your Medications? N -  Managing your Finances?  N -  Housekeeping or managing your Housekeeping? N -    Fall/Depression Screening: PHQ 2/9 Scores 08/17/2015 07/13/2015 05/05/2015 05/02/2015  PHQ - 2 Score '1 1 6 ' 0  PHQ- 9 Score - - 12 -    Assessment:  Patient will continue to benefit from [place] telephonic outreach for education and support of COPD  [facility] CM Care Plan Problem One        Most Recent Value   THN Long Term Goal (31-90 days)  -- [patient has met 30 days . Increase the time to 90 days]   Interventions for Short Term Goal #1  -- [patient know not to smoke or allow anyone to smoke around her oxygen.RN will send additional information on O2 safety ]   THN CM Short Term Goal #2 Start Date  -- [patient is using her inhalers and nebulizer treatment correctly as per order]   THN CM Short Term Goal #2 Met Date  08/17/15   [facility] CM Short Term Goal #3 Met Date  08/17/15   Interventions for Short Tern Goal #3  -- [patient is verbalizing that she is taking her medications as prescribed]   THN CM Short Term Goal #4 (0-30 days)  Patient will verbalize that she has been to a pulmonologist within 30 days   THN CM Short Term Goal #4 Start Date  08/17/15   Interventions for Short Term Goal #4  RN will follow up within 30 to days to see if patient has went to see a pulmonologist or need assistance     Plan: RN Health Coach will send EMMI information on Pneumonia [place] will send EMMI information on Oxygen Safety at Home RN will send patient information on 2017 [place] RN will follow up with patient within 30 days for outreach discussion and teach back.  [place] Care Management [phone]

## 2015-08-29 ENCOUNTER — Encounter (HOSPITAL_COMMUNITY): Payer: Self-pay | Admitting: Emergency Medicine

## 2015-08-29 ENCOUNTER — Emergency Department (HOSPITAL_COMMUNITY): Payer: Commercial Managed Care - HMO

## 2015-08-29 ENCOUNTER — Observation Stay (HOSPITAL_COMMUNITY)
Admission: EM | Admit: 2015-08-29 | Discharge: 2015-08-30 | Disposition: A | Discharge status: Home / Self Care | Payer: Commercial Managed Care - HMO | Attending: Internal Medicine | Admitting: Internal Medicine

## 2015-08-29 DIAGNOSIS — I129 Hypertensive chronic kidney disease with stage 1 through stage 4 chronic kidney disease, or unspecified chronic kidney disease: Secondary | ICD-10-CM | POA: Diagnosis not present

## 2015-08-29 DIAGNOSIS — E7800 Pure hypercholesterolemia, unspecified: Secondary | ICD-10-CM | POA: Diagnosis present

## 2015-08-29 DIAGNOSIS — R739 Hyperglycemia, unspecified: Secondary | ICD-10-CM | POA: Insufficient documentation

## 2015-08-29 DIAGNOSIS — Z87891 Personal history of nicotine dependence: Secondary | ICD-10-CM | POA: Diagnosis not present

## 2015-08-29 DIAGNOSIS — J441 Chronic obstructive pulmonary disease with (acute) exacerbation: Secondary | ICD-10-CM | POA: Diagnosis not present

## 2015-08-29 DIAGNOSIS — E785 Hyperlipidemia, unspecified: Secondary | ICD-10-CM | POA: Insufficient documentation

## 2015-08-29 DIAGNOSIS — M545 Low back pain: Secondary | ICD-10-CM | POA: Diagnosis not present

## 2015-08-29 DIAGNOSIS — R069 Unspecified abnormalities of breathing: Secondary | ICD-10-CM | POA: Diagnosis not present

## 2015-08-29 DIAGNOSIS — R0602 Shortness of breath: Secondary | ICD-10-CM | POA: Diagnosis not present

## 2015-08-29 DIAGNOSIS — R5381 Other malaise: Secondary | ICD-10-CM | POA: Diagnosis not present

## 2015-08-29 DIAGNOSIS — N183 Chronic kidney disease, stage 3 unspecified: Secondary | ICD-10-CM | POA: Diagnosis present

## 2015-08-29 DIAGNOSIS — I10 Essential (primary) hypertension: Secondary | ICD-10-CM | POA: Diagnosis present

## 2015-08-29 DIAGNOSIS — G8929 Other chronic pain: Secondary | ICD-10-CM

## 2015-08-29 DIAGNOSIS — J449 Chronic obstructive pulmonary disease, unspecified: Secondary | ICD-10-CM | POA: Diagnosis not present

## 2015-08-29 DIAGNOSIS — N179 Acute kidney failure, unspecified: Secondary | ICD-10-CM

## 2015-08-29 DIAGNOSIS — J9621 Acute and chronic respiratory failure with hypoxia: Secondary | ICD-10-CM | POA: Insufficient documentation

## 2015-08-29 HISTORY — DX: Chronic obstructive pulmonary disease with (acute) exacerbation: J44.1

## 2015-08-29 LAB — CBC WITH DIFFERENTIAL/PLATELET
Basophils Absolute: 0 10*3/uL (ref 0.0–0.1)
Basophils Relative: 0 %
Eosinophils Absolute: 0.4 10*3/uL (ref 0.0–0.7)
Eosinophils Relative: 4 %
HCT: 41.9 % (ref 36.0–46.0)
Hemoglobin: 13.6 g/dL (ref 12.0–15.0)
Lymphocytes Relative: 36 %
Lymphs Abs: 3 10*3/uL (ref 0.7–4.0)
MCH: 29.5 pg (ref 26.0–34.0)
MCHC: 32.5 g/dL (ref 30.0–36.0)
MCV: 90.9 fL (ref 78.0–100.0)
Monocytes Absolute: 0.6 10*3/uL (ref 0.1–1.0)
Monocytes Relative: 7 %
Neutro Abs: 4.4 10*3/uL (ref 1.7–7.7)
Neutrophils Relative %: 53 %
Platelets: 251 10*3/uL (ref 150–400)
RBC: 4.61 MIL/uL (ref 3.87–5.11)
RDW: 14.4 % (ref 11.5–15.5)
WBC: 8.3 10*3/uL (ref 4.0–10.5)

## 2015-08-29 LAB — BASIC METABOLIC PANEL
Anion gap: 14 (ref 5–15)
BUN: 10 mg/dL (ref 6–20)
CO2: 26 mmol/L (ref 22–32)
Calcium: 9.3 mg/dL (ref 8.9–10.3)
Chloride: 104 mmol/L (ref 101–111)
Creatinine, Ser: 1.21 mg/dL — ABNORMAL HIGH (ref 0.44–1.00)
GFR calc Af Amer: 51 mL/min — ABNORMAL LOW (ref >60)
GFR calc non Af Amer: 44 mL/min — ABNORMAL LOW (ref >60)
Glucose, Bld: 125 mg/dL — ABNORMAL HIGH (ref 65–99)
Potassium: 4.6 mmol/L (ref 3.5–5.1)
Sodium: 144 mmol/L (ref 135–145)

## 2015-08-29 IMAGING — CR DG CHEST 1V PORT
1 series · 1 of 1 positions shown · non-contrast
Comparison: [DATE].

CLINICAL DATA: Shortness of breath.

EXAM:
PORTABLE CHEST 1 VIEW

[AP]
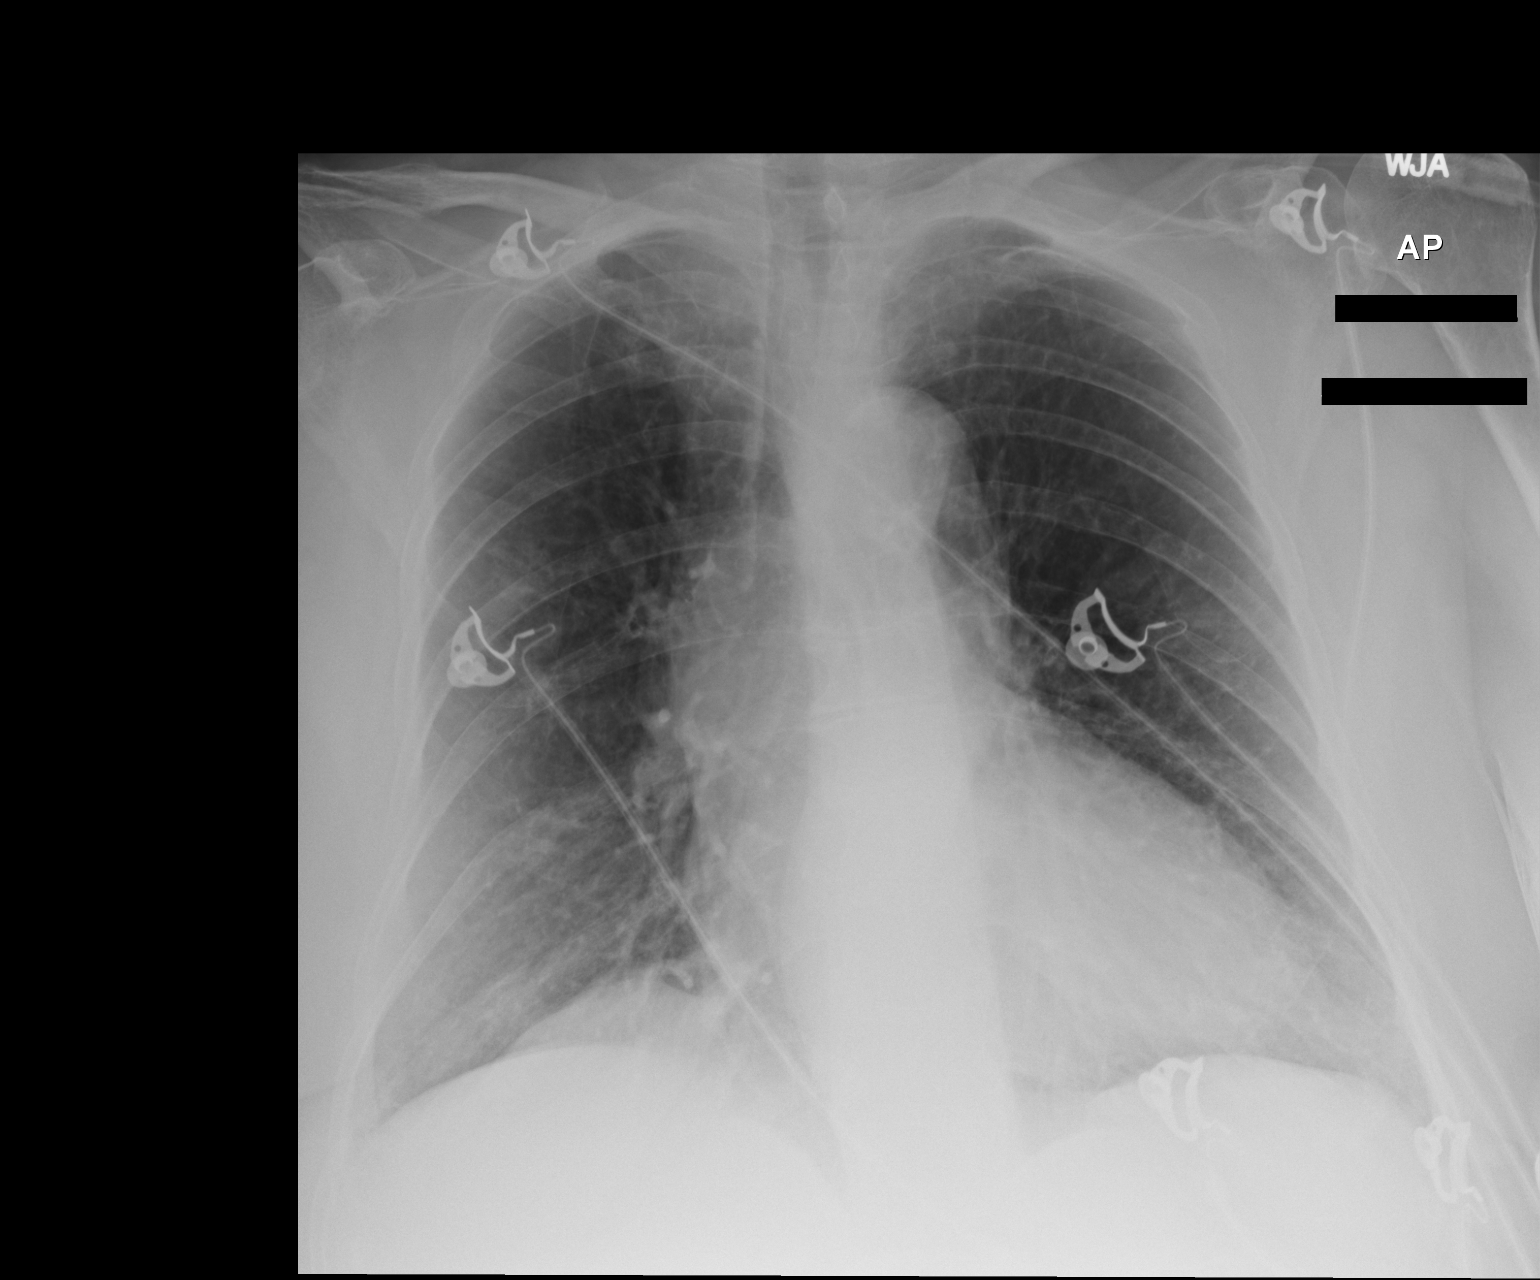

[1 of 1 positions shown; findings below may reference images not displayed]

FINDINGS: Stable cardiomediastinal silhouette. No pneumothorax or pleural
effusion is noted. Both lungs are clear. The visualized skeletal
structures are unremarkable.
IMPRESSION: No acute cardiopulmonary abnormality seen.

## 2015-08-29 MED ORDER — SODIUM CHLORIDE 0.9% FLUSH
3.0000 mL | Freq: Two times a day (BID) | INTRAVENOUS | Status: DC
  Administered 2015-08-29: 3 mL via INTRAVENOUS

## 2015-08-29 MED ORDER — DIAZEPAM 5 MG PO TABS
2.5000 mg | ORAL_TABLET | Freq: Every evening | ORAL | Status: DC | PRN
  Administered 2015-08-29: 2.5 mg via ORAL
  Filled 2015-08-29: qty 1

## 2015-08-29 MED ORDER — METHYLPREDNISOLONE SODIUM SUCC 125 MG IJ SOLR
60.0000 mg | Freq: Four times a day (QID) | INTRAMUSCULAR | Status: DC
  Administered 2015-08-29 – 2015-08-30 (×5): 60 mg via INTRAVENOUS
  Filled 2015-08-29 (×5): qty 2

## 2015-08-29 MED ORDER — ENOXAPARIN SODIUM 40 MG/0.4ML ~~LOC~~ SOLN
40.0000 mg | SUBCUTANEOUS | Status: DC
  Administered 2015-08-29: 40 mg via SUBCUTANEOUS
  Filled 2015-08-29: qty 0.4

## 2015-08-29 MED ORDER — ALBUTEROL SULFATE (2.5 MG/3ML) 0.083% IN NEBU
2.5000 mg | INHALATION_SOLUTION | Freq: Four times a day (QID) | RESPIRATORY_TRACT | Status: DC | PRN
  Administered 2015-08-29: 2.5 mg via RESPIRATORY_TRACT
  Filled 2015-08-29 (×2): qty 3

## 2015-08-29 MED ORDER — METOCLOPRAMIDE HCL 10 MG PO TABS
5.0000 mg | ORAL_TABLET | Freq: Two times a day (BID) | ORAL | Status: DC
  Administered 2015-08-29 – 2015-08-30 (×2): 5 mg via ORAL
  Filled 2015-08-29 (×2): qty 1

## 2015-08-29 MED ORDER — GABAPENTIN 100 MG PO CAPS
100.0000 mg | ORAL_CAPSULE | Freq: Three times a day (TID) | ORAL | Status: DC
  Administered 2015-08-29 – 2015-08-30 (×4): 100 mg via ORAL
  Filled 2015-08-29 (×4): qty 1

## 2015-08-29 MED ORDER — MULTI-VITAMIN/MINERALS PO TABS: 1.0000 | ORAL_TABLET | Freq: Every day | ORAL | Status: DC

## 2015-08-29 MED ORDER — PANTOPRAZOLE SODIUM 40 MG PO TBEC
40.0000 mg | DELAYED_RELEASE_TABLET | Freq: Every day | ORAL | Status: DC
  Administered 2015-08-30: 40 mg via ORAL
  Filled 2015-08-29: qty 1

## 2015-08-29 MED ORDER — LISINOPRIL 20 MG PO TABS
30.0000 mg | ORAL_TABLET | Freq: Every morning | ORAL | Status: DC
  Administered 2015-08-29 – 2015-08-30 (×2): 30 mg via ORAL
  Filled 2015-08-29 (×2): qty 1

## 2015-08-29 MED ORDER — ACETAMINOPHEN 650 MG RE SUPP: 650.0000 mg | Freq: Four times a day (QID) | RECTAL | Status: DC | PRN

## 2015-08-29 MED ORDER — ACETAMINOPHEN 325 MG PO TABS: 650.0000 mg | ORAL_TABLET | Freq: Four times a day (QID) | ORAL | Status: DC | PRN

## 2015-08-29 MED ORDER — ATORVASTATIN CALCIUM 20 MG PO TABS
20.0000 mg | ORAL_TABLET | Freq: Every day | ORAL | Status: DC
  Administered 2015-08-29: 20 mg via ORAL
  Filled 2015-08-29: qty 1

## 2015-08-29 MED ORDER — HYDROCODONE-ACETAMINOPHEN 5-325 MG PO TABS: 1.0000 | ORAL_TABLET | Freq: Two times a day (BID) | ORAL | Status: DC | PRN

## 2015-08-29 MED ORDER — BENZONATATE 100 MG PO CAPS: 200.0000 mg | ORAL_CAPSULE | Freq: Three times a day (TID) | ORAL | Status: DC | PRN

## 2015-08-29 MED ORDER — IPRATROPIUM-ALBUTEROL 0.5-2.5 (3) MG/3ML IN SOLN
3.0000 mL | Freq: Three times a day (TID) | RESPIRATORY_TRACT | Status: DC
  Administered 2015-08-30: 3 mL via RESPIRATORY_TRACT
  Filled 2015-08-29: qty 3

## 2015-08-29 MED ORDER — SERTRALINE HCL 100 MG PO TABS
100.0000 mg | ORAL_TABLET | Freq: Every day | ORAL | Status: DC
  Administered 2015-08-29 – 2015-08-30 (×2): 100 mg via ORAL
  Filled 2015-08-29 (×2): qty 1

## 2015-08-29 MED ORDER — MAGNESIUM OXIDE 400 MG PO TABS: 400.0000 mg | ORAL_TABLET | Freq: Every day | ORAL | Status: DC

## 2015-08-29 MED ORDER — RALOXIFENE HCL 60 MG PO TABS
60.0000 mg | ORAL_TABLET | Freq: Every day | ORAL | Status: DC
  Administered 2015-08-30: 60 mg via ORAL
  Filled 2015-08-29 (×2): qty 1

## 2015-08-29 MED ORDER — AMLODIPINE BESYLATE 5 MG PO TABS
5.0000 mg | ORAL_TABLET | Freq: Every day | ORAL | Status: DC
  Administered 2015-08-29 – 2015-08-30 (×2): 5 mg via ORAL
  Filled 2015-08-29 (×2): qty 1

## 2015-08-29 MED ORDER — IPRATROPIUM-ALBUTEROL 0.5-2.5 (3) MG/3ML IN SOLN
3.0000 mL | Freq: Four times a day (QID) | RESPIRATORY_TRACT | Status: DC
  Administered 2015-08-29 (×2): 3 mL via RESPIRATORY_TRACT
  Filled 2015-08-29 (×2): qty 3

## 2015-08-29 MED ORDER — MAGNESIUM OXIDE 400 (241.3 MG) MG PO TABS
400.0000 mg | ORAL_TABLET | Freq: Every day | ORAL | Status: DC
  Administered 2015-08-30: 400 mg via ORAL
  Filled 2015-08-29: qty 1

## 2015-08-29 MED ORDER — ALBUTEROL SULFATE (2.5 MG/3ML) 0.083% IN NEBU
5.0000 mg | INHALATION_SOLUTION | Freq: Once | RESPIRATORY_TRACT | Status: AC
  Administered 2015-08-29: 5 mg via RESPIRATORY_TRACT
  Filled 2015-08-29: qty 6

## 2015-08-29 MED ORDER — ALBUTEROL (5 MG/ML) CONTINUOUS INHALATION SOLN
10.0000 mg/h | INHALATION_SOLUTION | Freq: Once | RESPIRATORY_TRACT | Status: AC
  Administered 2015-08-29: 10 mg/h via RESPIRATORY_TRACT
  Filled 2015-08-29: qty 20

## 2015-08-29 MED ORDER — TIOTROPIUM BROMIDE MONOHYDRATE 18 MCG IN CAPS
18.0000 ug | ORAL_CAPSULE | Freq: Every day | RESPIRATORY_TRACT | Status: DC
  Administered 2015-08-30: 18 ug via RESPIRATORY_TRACT
  Filled 2015-08-29: qty 5

## 2015-08-29 MED ORDER — IPRATROPIUM-ALBUTEROL 0.5-2.5 (3) MG/3ML IN SOLN
3.0000 mL | Freq: Four times a day (QID) | RESPIRATORY_TRACT | Status: DC | PRN
  Administered 2015-08-30: 3 mL via RESPIRATORY_TRACT
  Filled 2015-08-29: qty 3

## 2015-08-29 MED ORDER — ADULT MULTIVITAMIN W/MINERALS CH
1.0000 | ORAL_TABLET | Freq: Every day | ORAL | Status: DC
  Administered 2015-08-29 – 2015-08-30 (×2): 1 via ORAL
  Filled 2015-08-29 (×2): qty 1

## 2015-08-29 NOTE — ED Notes (Signed)
Pt. Given crackers and a drink and applesauce

## 2015-08-29 NOTE — ED Notes (Signed)
Heart diet healthy

## 2015-08-29 NOTE — ED Notes (Signed)
Patient physically moved to room 36; placed back on monitor, continuous pulse oximetry and blood pressure cuff; also breathing treatment by RT; visitors at bedside

## 2015-08-29 NOTE — Progress Notes (Signed)
Placed pt on 3L nasal cannula. Pt tolerating well at this time. O2 sats 97% RT to monitor as needed

## 2015-08-29 NOTE — ED Notes (Signed)
CAT completed and placed back on nasal cannula at 3 liters. Pt states she is on 2 liters at home.
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
## 2015-08-29 NOTE — ED Notes (Signed)
Pt in EMS from home reporting SOB and difficulty breathing past several hours. Tried at home meds with no relief. Upon arrival pt wheezing, in tripod position. Given duoneb and solumedrol. Pt was 88RA and 100 on when EMS placed pt on CPAP. Pt also reports HA.

## 2015-08-29 NOTE — ED Notes (Signed)
Pt. oob to the bathroom, gait steady.  Pt. Did having coughing spell when she returned to the bed.  She denies any pain. No sob noted.  Pt. Given a coke

## 2015-08-30 DIAGNOSIS — J441 Chronic obstructive pulmonary disease with (acute) exacerbation: Secondary | ICD-10-CM | POA: Diagnosis not present

## 2015-08-30 DIAGNOSIS — J9621 Acute and chronic respiratory failure with hypoxia: Secondary | ICD-10-CM | POA: Diagnosis not present

## 2015-08-30 DIAGNOSIS — M545 Low back pain: Secondary | ICD-10-CM | POA: Diagnosis not present

## 2015-08-30 DIAGNOSIS — N179 Acute kidney failure, unspecified: Secondary | ICD-10-CM | POA: Diagnosis not present

## 2015-08-30 LAB — BASIC METABOLIC PANEL
Anion gap: 9 (ref 5–15)
BUN: 16 mg/dL (ref 6–20)
CO2: 26 mmol/L (ref 22–32)
Calcium: 9 mg/dL (ref 8.9–10.3)
Chloride: 105 mmol/L (ref 101–111)
Creatinine, Ser: 1.16 mg/dL — ABNORMAL HIGH (ref 0.44–1.00)
GFR calc Af Amer: 53 mL/min — ABNORMAL LOW (ref >60)
GFR calc non Af Amer: 46 mL/min — ABNORMAL LOW (ref >60)
Glucose, Bld: 145 mg/dL — ABNORMAL HIGH (ref 65–99)
Potassium: 4.1 mmol/L (ref 3.5–5.1)
Sodium: 140 mmol/L (ref 135–145)

## 2015-08-30 LAB — CBC
HCT: 34.9 % — ABNORMAL LOW (ref 36.0–46.0)
Hemoglobin: 11.6 g/dL — ABNORMAL LOW (ref 12.0–15.0)
MCH: 29.5 pg (ref 26.0–34.0)
MCHC: 33.2 g/dL (ref 30.0–36.0)
MCV: 88.8 fL (ref 78.0–100.0)
Platelets: 210 10*3/uL (ref 150–400)
RBC: 3.93 MIL/uL (ref 3.87–5.11)
RDW: 14.2 % (ref 11.5–15.5)
WBC: 17.4 10*3/uL — ABNORMAL HIGH (ref 4.0–10.5)

## 2015-08-30 MED ORDER — PREDNISONE 10 MG PO TABS: 60.0000 mg | ORAL_TABLET | Freq: Every day | ORAL | Status: DC

## 2015-08-30 MED ORDER — FLUTICASONE-SALMETEROL 250-50 MCG/DOSE IN AEPB: 1.0000 | INHALATION_SPRAY | Freq: Two times a day (BID) | RESPIRATORY_TRACT | Status: DC

## 2015-08-30 NOTE — Discharge Summary (Signed)
Physician Discharge Summary  [name] SNK:[id number] DOB: April 13, 1941 DOA: 08/29/2015  PCP: Wenda Low, MD  Admit date: 08/29/2015 Discharge date: 08/30/2015  Time spent: 50 minutes    Discharge Condition: stable    Discharge Diagnoses:  Principal Problem:   COPD exacerbation ([place]) Active Problems:   Acute on chronic respiratory failure with hypoxia (HCC)   HTN (hypertension)   HLD (hyperlipidemia)   Chronic low back pain   CKD (chronic kidney disease), stage III   History of present illness:  75 year old female patient with known COPD and former tobacco abuse, she has prn oxygen to use but is not on continuous oxygen, she also has hypertension, stage III chronic kidney disease, dyslipidemia chronic low back pain. She presented to the ER after developing progressive shortness of breath wheezing and tightness in the chest that did not improve with use of her usual nebs or MDIs. Of note she is been having frequent recurrences of these spells and is not on an inhaled steroid. She's not had any coughs or fevers. She called EMS for transport and her respiratory status was decompensated enough to where she did require BiPAP initially. She's had no sick contacts.  Hospital Course:  COPD exacerbation / Acute on chronic respiratory failure with hypoxia  -No acute infectious symptomatology including no cough, fevers or chills; -Symptoms resolved with IV steroids-she is currently on room air and not hypoxic-no wheezing-able to ambulate throughout the room without any dyspnea -Transition to prednisone taper-discharge home today -She states that she has frequent exacerbations of shortness of breath especially when she ambulates-have added Advair to her medications-she is to continue Spiriva and PRN nebulizer treatments  Active Problems:  HTN  -Blood pressure controlled -Continue Norvasc and Prinivil   CKD, stage III -Renal function stable and at baseline    HLD  -Continue  statin   Chronic low back pain  -Continue Neurontin and Vicodin    Discharge Exam: [facility] Weights   08/29/15 0607 08/29/15 1637  Weight: 72.576 kg (160 lb) 73.2 kg (161 lb 6 oz)   Filed Vitals:   08/30/15 0532 08/30/15 0949  BP: 102/59 122/72  Pulse: 88 98  Temp: 99 F (37.2 C)   Resp: 16     General: AAO x 3, no distress Cardiovascular: RRR, no murmurs  Respiratory: clear to auscultation bilaterally GI: soft, non-tender, non-distended, bowel sound positive  Discharge Instructions You were cared for by a hospitalist during your hospital stay. If you have any questions about your discharge medications or the care you received while you were in the hospital after you are discharged, you can call the unit and asked to speak with the hospitalist on call if the hospitalist that took care of you is not available. Once you are discharged, your primary care physician will handle any further medical issues. Please note that NO REFILLS for any discharge medications will be authorized once you are discharged, as it is imperative that you return to your primary care physician (or establish a relationship with a primary care physician if you do not have one) for your aftercare needs so that they can reassess your need for medications and monitor your lab values.      Discharge Instructions    Diet - low sodium heart healthy    Complete by:  As directed      Increase activity slowly    Complete by:  As directed             Medication List  TAKE these medications        albuterol 108 (90 Base) MCG/ACT inhaler  Commonly known as:  PROVENTIL HFA;VENTOLIN HFA  Inhale 2 puffs into the lungs every 6 (six) hours as needed.     albuterol (2.5 MG/3ML) 0.083% nebulizer solution  Commonly known as:  PROVENTIL  Take 3 mLs (2.5 mg total) by nebulization every 2 (two) hours as needed for wheezing or shortness of breath.     amLODipine 2.5 MG tablet  Commonly known as:  NORVASC  Take 5 mg  by mouth daily.     atorvastatin 20 MG tablet  Commonly known as:  LIPITOR  Take 20 mg by mouth at bedtime.     benzonatate 200 MG capsule  Commonly known as:  TESSALON  Take 1 capsule (200 mg total) by mouth 3 (three) times daily as needed for cough.     CALCIUM 1200+D3 PO  Take 2 tablets by mouth daily.     DAILY VITAMINS FOR WOMEN PO  Take 1 tablet by mouth daily.     diazepam 5 MG tablet  Commonly known as:  VALIUM  Take 2.5 mg by mouth at bedtime as needed for anxiety (for nerves).     Fluticasone-Salmeterol 250-50 MCG/DOSE Aepb  Commonly known as:  ADVAIR DISKUS  Inhale 1 puff into the lungs every 12 (twelve) hours.     gabapentin 100 MG capsule  Commonly known as:  NEURONTIN  Take 100 mg by mouth 3 (three) times daily.     guaiFENesin-dextromethorphan 100-10 MG/5ML syrup  Commonly known as:  ROBITUSSIN DM  Take 5 mLs by mouth every 4 (four) hours as needed for cough.     HYDROcodone-acetaminophen 5-325 MG tablet  Commonly known as:  NORCO/VICODIN  Take 1 tablet by mouth 2 (two) times daily as needed for moderate pain.     lisinopril 30 MG tablet  Commonly known as:  PRINIVIL,ZESTRIL  Take 30 mg by mouth every morning.     magnesium oxide 400 MG tablet  Commonly known as:  MAG-OX  Take 500 mg by mouth daily.     metoCLOPramide 5 MG tablet  Commonly known as:  REGLAN  Take 5 mg by mouth 2 (two) times daily.     multivitamin with minerals tablet  Take 1 tablet by mouth daily.     pantoprazole 40 MG tablet  Commonly known as:  PROTONIX  Take 1 tablet (40 mg total) by mouth daily at 6 (six) AM.     predniSONE 10 MG tablet  Commonly known as:  DELTASONE  Take 6 tablets (60 mg total) by mouth daily with breakfast. Take 60 mg tomorrow and taper by 10 mg daily until complete     raloxifene 60 MG tablet  Commonly known as:  EVISTA  Take 60 mg by mouth daily.     sertraline 100 MG tablet  Commonly known as:  ZOLOFT  Take 100 mg by mouth daily.      tiotropium 18 MCG inhalation capsule  Commonly known as:  SPIRIVA  Place 18 mcg into inhaler and inhale daily.       Allergies  Allergen Reactions  . Boniva [Ibandronate Sodium]   . Codeine Sulfate     Pt states she is not allergic  . Nitrofurantoin Monohyd Macro Rash   Follow-up Information    Follow up with Wenda Low, MD In 1 week.   Specialty:  Internal Medicine   Why:  If symptoms worsen   Contact information:  [place] [address] [place] [address] [phone]        The results of significant diagnostics from this hospitalization (including imaging, microbiology, ancillary and laboratory) are listed below for reference.    Significant Diagnostic Studies: Dg Chest Portable 1 View  08/29/2015  CLINICAL DATA:  Shortness of breath. EXAM: PORTABLE CHEST 1 VIEW COMPARISON:  April 27, 2015. FINDINGS: Stable cardiomediastinal silhouette. No pneumothorax or pleural effusion is noted. Both lungs are clear. The visualized skeletal structures are unremarkable. IMPRESSION: No acute cardiopulmonary abnormality seen. Electronically Signed   By: Marijo Conception, M.D.   On: 08/29/2015 07:54    Microbiology: No results found for this or any previous visit (from the past 240 hour(s)).   Labs: Basic Metabolic Panel:  Recent Labs Lab 08/29/15 0614 08/30/15 0558  NA 144 140  K 4.6 4.1  CL 104 105  CO2 26 26  GLUCOSE 125* 145*  BUN 10 16  CREATININE 1.21* 1.16*  CALCIUM 9.3 9.0   Liver Function Tests: No results for input(s): AST, ALT, ALKPHOS, BILITOT, PROT, ALBUMIN in the last 168 hours. No results for input(s): LIPASE, AMYLASE in the last 168 hours. No results for input(s): AMMONIA in the last 168 hours. CBC:  Recent Labs Lab 08/29/15 0614 08/30/15 0558  WBC 8.3 17.4*  NEUTROABS 4.4  --   HGB 13.6 11.6*  HCT 41.9 34.9*  MCV 90.9 88.8  PLT 251 210   Cardiac Enzymes: No results for input(s): CKTOTAL, CKMB, CKMBINDEX, TROPONINI in the last 168  hours. BNP: BNP (last 3 results)  Recent Labs  04/27/15 0614  BNP 48.4    ProBNP (last 3 results) No results for input(s): PROBNP in the last 8760 hours.  CBG: No results for input(s): GLUCAP in the last 168 hours.     SignedDebbe Odea, MD [facility] 08/30/2015, 12:28 PM

## 2015-08-30 NOTE — Progress Notes (Signed)
Patient with persistent cough and wheezing. Breathing treatment given

## 2015-08-30 NOTE — Progress Notes (Signed)
Pt given discharge instructions and prescription x2. Pt iv site removed. Pt telebox was removed, cleaned, and place back up front. CCMD was notified of discharge. Pt with no further questions. Pt taken downstairs via wheelchair by nurse tech.

## 2015-08-31 LAB — HEMOGLOBIN A1C
Hgb A1c MFr Bld: 5.3 % (ref 4.8–5.6)
Mean Plasma Glucose: 105 mg/dL

## 2015-09-07 DIAGNOSIS — J441 Chronic obstructive pulmonary disease with (acute) exacerbation: Secondary | ICD-10-CM | POA: Diagnosis not present

## 2015-09-07 DIAGNOSIS — J449 Chronic obstructive pulmonary disease, unspecified: Secondary | ICD-10-CM | POA: Diagnosis not present

## 2015-09-30 ENCOUNTER — Other Ambulatory Visit: Payer: Self-pay | Admitting: *Deleted

## 2015-09-30 NOTE — Patient Outreach (Signed)
[facility]) Care Management  [place]  09/30/2015   [name] 1940-07-26 [id number]  Subjective: RN Health Coach telephone call to patient.  [place] compliance verified. Per patient she had been in admitted the hospital. [facility] liaison didn't get a notice of admission.  Per patient she is off the oxygen now and all the oxygen equipment has been removed.  Patient is using inhalers and nebulizer treatments.  Patient stated she is doing a 9 out of a scale 1-2. Per patient she is feeling much better. She is not on a regular exercise program. 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  Patient is scheduled to see her PCP in April. Per patient she is going to talk to him about getting a pulmonologist. Patient is not using a walker or cane.   Objective:   Current Medications:  Current Outpatient Prescriptions  Medication Sig Dispense Refill  . albuterol (PROVENTIL HFA;VENTOLIN HFA) 108 (90 BASE) MCG/ACT inhaler Inhale 2 puffs into the lungs every 6 (six) hours as needed. (Patient taking differently: Inhale 2 puffs into the lungs every 6 (six) hours as needed for wheezing or shortness of breath. ) 18 g 1  . albuterol (PROVENTIL) (2.5 MG/3ML) 0.083% nebulizer solution Take 3 mLs (2.5 mg total) by nebulization every 2 (two) hours as needed for wheezing or shortness of breath. 75 mL 12  . amLODipine (NORVASC) 2.5 MG tablet Take 5 mg by mouth daily.    Marland Kitchen atorvastatin (LIPITOR) 20 MG tablet Take 20 mg by mouth at bedtime.    . benzonatate (TESSALON) 200 MG capsule Take 1 capsule (200 mg total) by mouth 3 (three) times daily as needed for cough. 20 capsule 0  . Calcium-Magnesium-Vitamin D (CALCIUM 1200+D3 PO) Take 2 tablets by mouth daily.    . diazepam (VALIUM) 5 MG tablet Take 2.5 mg by mouth at bedtime as needed for anxiety (for nerves).     . Fluticasone-Salmeterol (ADVAIR DISKUS)  250-50 MCG/DOSE AEPB Inhale 1 puff into the lungs every 12 (twelve) hours. 60 each 0  . gabapentin (NEURONTIN) 100 MG capsule Take 100 mg by mouth 3 (three) times daily.    Marland Kitchen guaiFENesin-dextromethorphan (ROBITUSSIN DM) 100-10 MG/5ML syrup Take 5 mLs by mouth every 4 (four) hours as needed for cough. 118 mL 0  . HYDROcodone-acetaminophen (NORCO) 5-325 MG per tablet Take 1 tablet by mouth 2 (two) times daily as needed for moderate pain.     Marland Kitchen lisinopril (PRINIVIL,ZESTRIL) 30 MG tablet Take 30 mg by mouth every morning.    . magnesium oxide (MAG-OX) 400 MG tablet Take 500 mg by mouth daily.    . metoCLOPramide (REGLAN) 5 MG tablet Take 5 mg by mouth 2 (two) times daily.     . Multiple Vitamins-Calcium (DAILY VITAMINS FOR WOMEN PO) Take 1 tablet by mouth daily.    . Multiple Vitamins-Minerals (MULTIVITAMIN WITH MINERALS) tablet Take 1 tablet by mouth daily.      . pantoprazole (PROTONIX) 40 MG tablet Take 1 tablet (40 mg total) by mouth daily at 6 (six) AM. 30 tablet 0  . raloxifene (EVISTA) 60 MG tablet Take 60 mg by mouth daily.    . sertraline (ZOLOFT) 100 MG tablet Take 100 mg by mouth daily.    Marland Kitchen tiotropium (SPIRIVA) 18 MCG inhalation capsule Place 18 mcg into inhaler and inhale daily.      Marland Kitchen  predniSONE (DELTASONE) 10 MG tablet Take 6 tablets (60 mg total) by mouth daily with breakfast. Take 60 mg tomorrow and taper by 10 mg daily until complete (Patient not taking: Reported on 09/30/2015) 21 tablet 0   No current facility-administered medications for this visit.    Functional Status:  In your present state of health, do you have any difficulty performing the following activities: 09/30/2015 08/29/2015  Hearing? N N  Vision? N N  Difficulty concentrating or making decisions? N N  Walking or climbing stairs? N N  Dressing or bathing? N N  Doing errands, shopping? N N  Preparing Food and eating ? - -  Using the Toilet? - -  In the past six months, have you accidently leaked urine? - -  Do you  have problems with loss of bowel control? - -  Managing your Medications? - -  Managing your Finances? - -  Housekeeping or managing your Housekeeping? - -    Fall/Depression Screening: PHQ 2/9 Scores 09/30/2015 08/17/2015 07/13/2015 05/05/2015 05/02/2015  PHQ - 2 Score _0 0  PHQ- 9 Score - - - 12 -   THN CM Care Plan Problem One        Most Recent Value   Care Plan Problem One  Knowledge deficit of self management of COPD   Role Documenting the Problem One  Health [place] for Problem One  Active   THN Long Term Goal (31-90 days)  Patient will not have any readmissions for copd in the next 90 days   THN Long Term Goal Start Date  09/30/15   Interventions for Problem One [place] sent  patient educational material on COPD. RN Health Coach will do follow up outreach calls within a month . RN discussed taking medications as per physiciain orders   THN CM Short Term Goal #1 Met Date  09/30/15 [oxygen has been discontinued and removed from patient house]   THN CM Short Term Goal #2 (0-30 days)  Patient will continue  to verbalize using the inhares the correct way and adhering to medication times   THN CM Short Term Goal #2 Start Date  09/30/15   Interventions for Short Term Goal #2  RN sent patient EMMI on using inhalers the right way. RN Health Coach will  have  continue follow up discussion on the proper technique   THN CM Short Term Goal #3 (0-30 days)  Patient will verbalize medication adherence within 30 days   THN CM Short Term Goal #3 Start Date  09/30/15 [continue following patient medication adherence]   Interventions for Short Tern Goal #3  RN will discuss with the patient importance of medication adherence   THN CM Short Term Goal #4 (0-30 days)  Patient will verbalize that she has made an appointment  with a pulmonologist with in the next 30 days   THN CM Short Term Goal #4 Start Date  09/30/15   Interventions for Short Term Goal #4  Patient is  following up with her PCP for Pulmonologist  referral. RN will follow up with patient to make sure Pulmonary Dr obtained      Assessment: Patient will continue to benefit from Health Coach telephonic outreach for education and support for COPD self management.   Plan: Patient will verbalize within 30 days that she has followed up with PCP for Pulmonologist Referral. RN will send patient EMMI information on COPD What you can do  RN will send educational information on COPD exacerbation RN  Will send educational information on Heart Healthy Eating RN  Will send educational information on Eating on a budget RN  Will send educational information on Cholesterol RN  Will send educational information on Dash Eating RN will send EMMI information on Cholesterol and Lifestyle RN will send EMMI information on Dietary Fats Explained RN will follow up outreach within a month for discussion and teach back  Johny Shock, BSN, [place] Coach Phone: [place] complies with applicable Federal civil rights laws and does not discriminate on the basis of race, color, national origin, age, disability, or sex. Espaol (Spanish)  [place] cumple con las leyes federales de derechos civiles aplicables y no discrimina por motivos de raza, color, nacionalidad, edad, discapacidad o sexo.    Ti?[place] Vi?t ([place])  [place] tun th? lu?t dn quy?n hi?n hnh c?a Lin bang v khng phn bi?t ?i x? d?a trn ch?ng t?c, mu da, ngu?n g?c qu?c gia, ? tu?i, khuy?t t?t, ho?c gi?i tnh.    (Arabic)    [place]                      .

## 2015-10-01 ENCOUNTER — Encounter: Payer: Self-pay | Admitting: *Deleted

## 2015-10-26 ENCOUNTER — Ambulatory Visit: Payer: Self-pay | Admitting: *Deleted

## 2015-10-28 ENCOUNTER — Ambulatory Visit: Payer: Self-pay | Admitting: *Deleted

## 2015-11-10 DIAGNOSIS — I10 Essential (primary) hypertension: Secondary | ICD-10-CM | POA: Diagnosis not present

## 2015-11-10 DIAGNOSIS — E782 Mixed hyperlipidemia: Secondary | ICD-10-CM | POA: Diagnosis not present

## 2015-11-10 DIAGNOSIS — K3184 Gastroparesis: Secondary | ICD-10-CM | POA: Diagnosis not present

## 2015-11-10 DIAGNOSIS — N183 Chronic kidney disease, stage 3 (moderate): Secondary | ICD-10-CM | POA: Diagnosis not present

## 2015-11-10 DIAGNOSIS — J449 Chronic obstructive pulmonary disease, unspecified: Secondary | ICD-10-CM | POA: Diagnosis not present

## 2015-11-10 DIAGNOSIS — R7309 Other abnormal glucose: Secondary | ICD-10-CM | POA: Diagnosis not present

## 2015-11-10 DIAGNOSIS — Z0000 Encounter for general adult medical examination without abnormal findings: Secondary | ICD-10-CM | POA: Diagnosis not present

## 2015-11-10 DIAGNOSIS — K219 Gastro-esophageal reflux disease without esophagitis: Secondary | ICD-10-CM | POA: Diagnosis not present

## 2015-11-10 DIAGNOSIS — M810 Age-related osteoporosis without current pathological fracture: Secondary | ICD-10-CM | POA: Diagnosis not present

## 2015-11-10 DIAGNOSIS — F322 Major depressive disorder, single episode, severe without psychotic features: Secondary | ICD-10-CM | POA: Diagnosis not present

## 2015-11-30 ENCOUNTER — Ambulatory Visit (INDEPENDENT_AMBULATORY_CARE_PROVIDER_SITE_OTHER): Payer: Commercial Managed Care - HMO | Admitting: Pulmonary Disease

## 2015-11-30 ENCOUNTER — Encounter: Payer: Self-pay | Admitting: Pulmonary Disease

## 2015-11-30 VITALS — BP 132/82 | HR 72 | Ht 61.5 in | Wt 166.0 lb

## 2015-11-30 DIAGNOSIS — J439 Emphysema, unspecified: Secondary | ICD-10-CM | POA: Diagnosis not present

## 2015-11-30 NOTE — Progress Notes (Signed)
Subjective:    Patient ID: [name], female    DOB: Nov 11, 1940, 75 y.o.   MRN: [id number]  HPI Consult for evaluation of COPD  [name] is a 75 year old with past medical history of COPD. She was hospitalized in November 2016 for a COPD exacerbation. She was treated at that time with IV steroids, nebs. She is maintained on Spiriva and Advair with good control of symptoms. In the office today she has complains of dyspnea on exertion, occasional cough with white sputum production. No wheezing, hemoptysis. She's had spirometry done in 2016 which showed obstructive lung disease.   DATA: CXR 08/29/15 No acute cardiopulmonary abnormality.  Spirometry 2016-FEV1 1.4 L. Mild obstruction.  Social History: 60-80-pack-year smoking history. Quit in 2012. No alcohol, drug use.  Family History: Brother-heart disease Father-heart disease Mother-heart disease   Past Medical History  Diagnosis Date  . Hypertension   . GERD (gastroesophageal reflux disease)   . Anxiety   . Depression   . COPD (chronic obstructive pulmonary disease) ([place])   . Low back pain   . Lumbar back pain   . Osteoporosis   . Osteoarthritis   . Hyperlipidemia   . Cyst     left side of neck  . Cancer [facility])     uterine    Current outpatient prescriptions:  .  albuterol (PROVENTIL HFA;VENTOLIN HFA) 108 (90 BASE) MCG/ACT inhaler, Inhale 2 puffs into the lungs every 6 (six) hours as needed. (Patient taking differently: Inhale 2 puffs into the lungs every 6 (six) hours as needed for wheezing or shortness of breath. ), Disp: 18 g, Rfl: 1 .  albuterol (PROVENTIL) (2.5 MG/3ML) 0.083% nebulizer solution, Take 3 mLs (2.5 mg total) by nebulization every 2 (two) hours as needed for wheezing or shortness of breath., Disp: 75 mL, Rfl: 12 .  amLODipine (NORVASC) 2.5 MG tablet, Take 5 mg by mouth daily., Disp: , Rfl:  .  atorvastatin (LIPITOR) 20 MG tablet, Take 20 mg by mouth at bedtime., Disp: , Rfl:  .  benzonatate  (TESSALON) 200 MG capsule, Take 1 capsule (200 mg total) by mouth 3 (three) times daily as needed for cough., Disp: 20 capsule, Rfl: 0 .  Calcium-Magnesium-Vitamin D (CALCIUM 1200+D3 PO), Take 2 tablets by mouth daily., Disp: , Rfl:  .  diazepam (VALIUM) 5 MG tablet, Take 2.5 mg by mouth at bedtime as needed for anxiety (for nerves). , Disp: , Rfl:  .  Fluticasone-Salmeterol (ADVAIR DISKUS) 250-50 MCG/DOSE AEPB, Inhale 1 puff into the lungs every 12 (twelve) hours., Disp: 60 each, Rfl: 0 .  gabapentin (NEURONTIN) 100 MG capsule, Take 100 mg by mouth 3 (three) times daily., Disp: , Rfl:  .  guaiFENesin-dextromethorphan (ROBITUSSIN DM) 100-10 MG/5ML syrup, Take 5 mLs by mouth every 4 (four) hours as needed for cough., Disp: 118 mL, Rfl: 0 .  HYDROcodone-acetaminophen (NORCO) 5-325 MG per tablet, Take 1 tablet by mouth 2 (two) times daily as needed for moderate pain. , Disp: , Rfl:  .  lisinopril (PRINIVIL,ZESTRIL) 30 MG tablet, Take 30 mg by mouth every morning., Disp: , Rfl:  .  magnesium oxide (MAG-OX) 400 MG tablet, Take 500 mg by mouth daily., Disp: , Rfl:  .  metoCLOPramide (REGLAN) 5 MG tablet, Take 5 mg by mouth 2 (two) times daily. , Disp: , Rfl:  .  Multiple Vitamins-Calcium (DAILY VITAMINS FOR WOMEN PO), Take 1 tablet by mouth daily., Disp: , Rfl:  .  Multiple Vitamins-Minerals (MULTIVITAMIN WITH MINERALS) tablet, Take  1 tablet by mouth daily.  , Disp: , Rfl:  .  pantoprazole (PROTONIX) 40 MG tablet, Take 1 tablet (40 mg total) by mouth daily at 6 (six) AM., Disp: 30 tablet, Rfl: 0 .  raloxifene (EVISTA) 60 MG tablet, Take 60 mg by mouth daily., Disp: , Rfl:  .  sertraline (ZOLOFT) 100 MG tablet, Take 100 mg by mouth daily., Disp: , Rfl:  .  tiotropium (SPIRIVA) 18 MCG inhalation capsule, Place 18 mcg into inhaler and inhale daily.  , Disp: , Rfl:   Review of Systems Dyspnea on exertion, occasional cough, white sputum production. No wheezing, hemoptysis. No fevers, chills, loss of weight,  loss of appetite. No nausea, vomiting, diarrhea, constipation. All other review of systems are negative    Objective:   Physical Exam Blood pressure 132/82, pulse 72, height 5' 1.5" (1.562 m), weight 166 lb (75.297 kg), SpO2 95 %. Gen: No apparent distress Neuro: No gross focal deficits. HEENT: No JVD, lymphadenopathy, thyromegaly. RS: Clear, No wheeze or crackles CVS: S1-S2 heard, no murmurs rubs gallops. Abdomen: Soft, positive bowel sounds. Musculoskeletal: No edema.    Assessment & Plan:  COPD Reported to have mild obstruction on spirometry in 2016. She appears to be well controlled on Advair and Spiriva. Her last exacerbation was in November 2016. She will continue the inhalers as prescribed. I'll also send her for a full set of PFTs for most recent evaluation of her lung function.  She is a candidate for screening CTs of the chest given her extensive smoking history. I'll address this at her next visit.  Plan: - Continue advair, spiriva - Full PFTs.  Marshell Garfinkel MD [facility] Pager [phone] If no answer or after 3pm call: [phone] 11/30/2015, 1:22 PM

## 2015-11-30 NOTE — Patient Instructions (Signed)
We will schedule for pulmonary function tests.  Continue using the Advair and Spiriva. Return to clinic in 3 months.

## 2015-12-28 DIAGNOSIS — R350 Frequency of micturition: Secondary | ICD-10-CM | POA: Diagnosis not present

## 2015-12-28 DIAGNOSIS — J209 Acute bronchitis, unspecified: Secondary | ICD-10-CM | POA: Diagnosis not present

## 2016-02-15 ENCOUNTER — Other Ambulatory Visit: Payer: Self-pay | Admitting: *Deleted

## 2016-02-15 NOTE — Patient Outreach (Signed)
[facility]) Care Management  02/15/2016  [name] 1940-09-08 [id number]  RN Health Coach attempted #1 Follow up outreach call to patient.  Patient was unavailable. HIPPA compliance voicemail message was left with return callback number.  Plan: RN will call patient again within 14 days.   [place] Care Management [phone]

## 2016-02-17 ENCOUNTER — Other Ambulatory Visit: Payer: Self-pay | Admitting: Internal Medicine

## 2016-02-17 DIAGNOSIS — Z1231 Encounter for screening mammogram for malignant neoplasm of breast: Secondary | ICD-10-CM

## 2016-02-29 ENCOUNTER — Ambulatory Visit: Payer: Self-pay | Admitting: *Deleted

## 2016-03-01 ENCOUNTER — Other Ambulatory Visit: Payer: Self-pay | Admitting: Internal Medicine

## 2016-03-01 ENCOUNTER — Ambulatory Visit
Admission: RE | Admit: 2016-03-01 | Discharge: 2016-03-01 | Disposition: A | Discharge status: Home / Self Care | Payer: Commercial Managed Care - HMO | Source: Ambulatory Visit | Attending: Internal Medicine | Admitting: Internal Medicine

## 2016-03-01 DIAGNOSIS — Z1231 Encounter for screening mammogram for malignant neoplasm of breast: Secondary | ICD-10-CM

## 2016-03-01 DIAGNOSIS — N644 Mastodynia: Secondary | ICD-10-CM

## 2016-03-03 ENCOUNTER — Other Ambulatory Visit: Payer: Self-pay | Admitting: *Deleted

## 2016-03-03 NOTE — Patient Outreach (Signed)
[name]) Care Management  03/03/2016  JAILEIGH WEIMER Dec 12, 1940 [id number]    RN Health Coach  attempted  Follow up outreach call to patient.  Patient was unavailable. HIPPA compliance voicemail message was left with return callback number.  Plan: RN will call patient again within 14 days.   [place] Care Management [phone]

## 2016-03-08 ENCOUNTER — Ambulatory Visit (INDEPENDENT_AMBULATORY_CARE_PROVIDER_SITE_OTHER): Payer: Commercial Managed Care - HMO | Admitting: Pulmonary Disease

## 2016-03-08 ENCOUNTER — Encounter (INDEPENDENT_AMBULATORY_CARE_PROVIDER_SITE_OTHER): Payer: Commercial Managed Care - HMO | Admitting: Pulmonary Disease

## 2016-03-08 ENCOUNTER — Encounter: Payer: Self-pay | Admitting: Pulmonary Disease

## 2016-03-08 VITALS — BP 132/74 | HR 73 | Ht 60.5 in | Wt 165.0 lb

## 2016-03-08 DIAGNOSIS — J439 Emphysema, unspecified: Secondary | ICD-10-CM

## 2016-03-08 DIAGNOSIS — Z87891 Personal history of nicotine dependence: Secondary | ICD-10-CM | POA: Diagnosis not present

## 2016-03-08 LAB — PULMONARY FUNCTION TEST
DL/VA % pred: 75 %
DL/VA: 3.27 ml/min/mmHg/L
DLCO cor % pred: 74 %
DLCO cor: 14.62 ml/min/mmHg
DLCO unc % pred: 72 %
DLCO unc: 14.1 ml/min/mmHg
FEF 25-75 Post: 0.74 L/sec
FEF 25-75 Pre: 0.57 L/sec
FEF2575-%Change-Post: 28 %
FEF2575-%Pred-Post: 47 %
FEF2575-%Pred-Pre: 36 %
FEV1-%Change-Post: 6 %
FEV1-%Pred-Post: 69 %
FEV1-%Pred-Pre: 65 %
FEV1-Post: 1.29 L
FEV1-Pre: 1.21 L
FEV1FVC-%Change-Post: -3 %
FEV1FVC-%Pred-Pre: 82 %
FEV6-%Change-Post: 9 %
FEV6-%Pred-Post: 90 %
FEV6-%Pred-Pre: 82 %
FEV6-Post: 2.13 L
FEV6-Pre: 1.94 L
FEV6FVC-%Change-Post: 0 %
FEV6FVC-%Pred-Post: 103 %
FEV6FVC-%Pred-Pre: 104 %
FVC-%Change-Post: 10 %
FVC-%Pred-Post: 87 %
FVC-%Pred-Pre: 79 %
FVC-Post: 2.17 L
FVC-Pre: 1.96 L
Post FEV1/FVC ratio: 59 %
Post FEV6/FVC ratio: 98 %
Pre FEV1/FVC ratio: 62 %
Pre FEV6/FVC Ratio: 99 %
RV % pred: 129 %
RV: 2.69 L
TLC % pred: 109 %
TLC: 4.94 L

## 2016-03-08 NOTE — Progress Notes (Signed)
[name]    [id number]    February 26, 1941  Primary Care [facility], MD  Referring Physician: Wenda Low, MD [place] [address], [place] [address]  Chief complaint:  Follow up for COPD  HPI: [name] is a 75 year old with past medical history of COPD. She was hospitalized in November 2016 for a COPD exacerbation. She was treated at that time with IV steroids, nebs. She is maintained on Spiriva and Advair with good control of symptoms. In the office today she has complains of dyspnea on exertion, occasional cough with white sputum production. No wheezing, hemoptysis. She's had spirometry done in 2016 which showed obstructive lung disease.    Outpatient Encounter Prescriptions as of 03/08/2016  Medication Sig  . albuterol (PROVENTIL HFA;VENTOLIN HFA) 108 (90 BASE) MCG/ACT inhaler Inhale 2 puffs into the lungs every 6 (six) hours as needed. (Patient taking differently: Inhale 2 puffs into the lungs every 6 (six) hours as needed for wheezing or shortness of breath. )  . albuterol (PROVENTIL) (2.5 MG/3ML) 0.083% nebulizer solution Take 3 mLs (2.5 mg total) by nebulization every 2 (two) hours as needed for wheezing or shortness of breath.  Marland Kitchen amLODipine (NORVASC) 2.5 MG tablet Take 5 mg by mouth daily.  Marland Kitchen atorvastatin (LIPITOR) 20 MG tablet Take 20 mg by mouth at bedtime.  . Calcium-Magnesium-Vitamin D (CALCIUM 1200+D3 PO) Take 2 tablets by mouth daily.  . diazepam (VALIUM) 5 MG tablet Take 2.5 mg by mouth at bedtime as needed for anxiety (for nerves).   . Fluticasone-Salmeterol (ADVAIR DISKUS) 250-50 MCG/DOSE AEPB Inhale 1 puff into the lungs every 12 (twelve) hours.  . gabapentin (NEURONTIN) 100 MG capsule Take 100 mg by mouth 3 (three) times daily.  Marland Kitchen guaiFENesin-dextromethorphan (ROBITUSSIN DM) 100-10 MG/5ML syrup Take 5 mLs by mouth every 4 (four) hours as needed for cough.  Marland Kitchen HYDROcodone-acetaminophen (NORCO) 5-325 MG per tablet Take 1 tablet  by mouth 2 (two) times daily as needed for moderate pain.   Marland Kitchen lisinopril (PRINIVIL,ZESTRIL) 30 MG tablet Take 30 mg by mouth every morning.  . magnesium oxide (MAG-OX) 400 MG tablet Take 500 mg by mouth daily.  . metoCLOPramide (REGLAN) 5 MG tablet Take 5 mg by mouth 2 (two) times daily.   . Multiple Vitamins-Calcium (DAILY VITAMINS FOR WOMEN PO) Take 1 tablet by mouth daily.  . pantoprazole (PROTONIX) 40 MG tablet Take 1 tablet (40 mg total) by mouth daily at 6 (six) AM.  . raloxifene (EVISTA) 60 MG tablet Take 60 mg by mouth daily.  . sertraline (ZOLOFT) 100 MG tablet Take 100 mg by mouth daily.  Marland Kitchen tiotropium (SPIRIVA) 18 MCG inhalation capsule Place 18 mcg into inhaler and inhale daily.    . [DISCONTINUED] Multiple Vitamins-Minerals (MULTIVITAMIN WITH MINERALS) tablet Take 1 tablet by mouth daily.    . [DISCONTINUED] benzonatate (TESSALON) 200 MG capsule Take 1 capsule (200 mg total) by mouth 3 (three) times daily as needed for cough. (Patient not taking: Reported on 03/08/2016)   No facility-administered encounter medications on file as of 03/08/2016.     Allergies as of 03/08/2016 - Review Complete 03/08/2016  Allergen Reaction Noted  . Boniva [ibandronate sodium]  08/02/2011  . Codeine sulfate  08/02/2011  . Nitrofurantoin monohyd macro Rash 08/02/2011    Past Medical History:  Diagnosis Date  . Anxiety   . Cancer ([place])    uterine  . COPD (chronic obstructive pulmonary disease) ([place])   . Cyst  left side of neck  . Depression   . GERD (gastroesophageal reflux disease)   . Hyperlipidemia   . Hypertension   . Low back pain   . Lumbar back pain   . Osteoarthritis   . Osteoporosis     Past Surgical History:  Procedure Laterality Date  . ANKLE SURGERY Right    horse accident  . APPENDECTOMY    . BACK SURGERY    . CHOLECYSTECTOMY    . FRACTURE SURGERY    . HIP SURGERY  01/2010   Left Hip   . INNER EAR SURGERY Bilateral 1970   related to severe ear infections  . KNEE  SURGERY     Right knee  . MASS EXCISION  08/25/2011   Procedure: EXCISION MASS;  Surgeon: Harl Bowie, MD;  Location: [facility];  Service: General;  Laterality: N/A;  excision left neck mass  . TOTAL ABDOMINAL HYSTERECTOMY      Family History  Problem Relation Age of Onset  . Heart disease Mother   . Heart disease Father   . Cancer Sister     #1, breast  . Heart disease Brother     had CABG    Social History   Social History  . Marital status: Widowed    Spouse name: N/A  . Number of children: N/A  . Years of education: N/A   Occupational History  . Not on file.   Social History Main Topics  . Smoking status: Former Smoker    Packs/day: 2.00    Years: 40.00    Types: Cigarettes    Quit date: 05/04/2012  . Smokeless tobacco: Never Used  . Alcohol use No  . Drug use: No  . Sexual activity: Yes    Birth control/ protection: Other-see comments   Other Topics Concern  . Not on file   Social History Narrative   Single, lives alone with 2 dogs   3 children   Retired: several careers included Regulatory affairs officer, diners, truck stops   Works now on the weekends at [employer] of systems: Review of Systems  Constitutional: Negative for fever and chills.  HENT: Negative.   Eyes: Negative for blurred vision.  Respiratory: as per HPI  Cardiovascular: Negative for chest pain and palpitations.  Gastrointestinal: Negative for vomiting, diarrhea, blood per rectum. Genitourinary: Negative for dysuria, urgency, frequency and hematuria.  Musculoskeletal: Negative for myalgias, back pain and joint pain.  Skin: Negative for itching and rash.  Neurological: Negative for dizziness, tremors, focal weakness, seizures and loss of consciousness.  Endo/Heme/Allergies: Negative for environmental allergies.  Psychiatric/Behavioral: Negative for depression, suicidal ideas and hallucinations.  All other systems reviewed and are negative.   Physical  Exam: Blood pressure 132/74, pulse 73, height 5' 0.5" (1.537 m), weight 165 lb (74.8 kg), SpO2 95 %. Gen:      No acute distress HEENT:  EOMI, sclera anicteric Neck:     No masses; no thyromegaly Lungs:    Clear to auscultation bilaterally; normal respiratory effort CV:         Regular rate and rhythm; no murmurs Abd:      + bowel sounds; soft, non-tender; no palpable masses, no distension Ext:    No edema; adequate peripheral perfusion Skin:      Warm and dry; no rash Neuro: alert and oriented x 3 Psych: normal mood and affect  Data Reviewed: Imaging CXR 08/29/15 No acute cardiopulmonary abnormality.  Pulmonary Spirometry 2016-FEV1 1.4 L.  Mild obstruction.  PFTs 03/08/16 FVC 1.96 [79%) FEV1 1.21 (65%) F/F 62 TLC 109% RV/TLC 120% DLCO 72% Moderate obstructive disease with air trapping and DLCO impairment.  Assessment:  COPD Moderate obstruction on today's PFTs. She appears to be well controlled on Advair and Spiriva. Her last exacerbation was in November 2016. She will continue the inhalers as prescribed.   She is a candidate for screening CTs of the chest given her extensive smoking history.  Plan/Recommendations: - Continue Advair, Spiriva  - Referral for lung cancer screening  Marshell Garfinkel MD [facility] Pager [phone] 03/08/2016, 11:21 AM  CC: Wenda Low, MD

## 2016-03-08 NOTE — Patient Instructions (Signed)
Continue using the Advair Spiriva. We will make a referral to lung cancer screening program.  Return to clinic in 6 months.

## 2016-03-09 ENCOUNTER — Other Ambulatory Visit: Payer: Self-pay | Admitting: Acute Care

## 2016-03-09 DIAGNOSIS — Z87891 Personal history of nicotine dependence: Secondary | ICD-10-CM

## 2016-03-09 NOTE — Addendum Note (Signed)
Addended by: Parke Poisson E on: 03/09/2016 09:50 AM   Modules accepted: Orders

## 2016-03-10 ENCOUNTER — Ambulatory Visit
Admission: RE | Admit: 2016-03-10 | Discharge: 2016-03-10 | Disposition: A | Discharge status: Home / Self Care | Payer: Commercial Managed Care - HMO | Source: Ambulatory Visit | Attending: Internal Medicine | Admitting: Internal Medicine

## 2016-03-10 ENCOUNTER — Other Ambulatory Visit: Payer: Self-pay | Admitting: Internal Medicine

## 2016-03-10 DIAGNOSIS — N63 Unspecified lump in breast: Secondary | ICD-10-CM | POA: Diagnosis not present

## 2016-03-10 DIAGNOSIS — N644 Mastodynia: Secondary | ICD-10-CM

## 2016-03-17 ENCOUNTER — Other Ambulatory Visit: Payer: Self-pay | Admitting: *Deleted

## 2016-03-17 NOTE — Patient Outreach (Signed)
[facility] Care Management  03/17/2016  [name] 1941-05-30 [id number]   RN Health Coach  attempted  Follow up outreach call to patient.  Patient was unavailable. HIPPA compliance voicemail message was left with return callback number.  Plan: RN will call patient again within 14 days.    [facility] [phone]

## 2016-03-18 ENCOUNTER — Other Ambulatory Visit: Payer: Self-pay | Admitting: *Deleted

## 2016-03-18 ENCOUNTER — Encounter: Payer: Self-pay | Admitting: *Deleted

## 2016-03-18 NOTE — Patient Outreach (Signed)
[facility]) Care Management  03/18/2016   KEYONNA COMUNALE 1940-11-03 [id number]  Subjective: RN Health Coach telephone call to patient.  Hipaa compliance verified. Per patient she is doing good. She is not staying in the heat very much. Patient is able to do chores around the house but has to sit down and rest at times. Patient is having to use her rescue inhalers 2-3 times a week. Patient is not currently on any home O2.  Patient is not having any wheezing or tightness in the chest. Patient is not coughing or having any additional mucus. Patient has not had any routine care such as eye exam in 5 years. RN discussed with patient keeping up with maintaining her overall health. Patient has agreed to follow up outreach call.    Objective:   Current Medications:  Current Outpatient Prescriptions  Medication Sig Dispense Refill  . albuterol (PROVENTIL HFA;VENTOLIN HFA) 108 (90 BASE) MCG/ACT inhaler Inhale 2 puffs into the lungs every 6 (six) hours as needed. (Patient taking differently: Inhale 2 puffs into the lungs every 6 (six) hours as needed for wheezing or shortness of breath. ) 18 g 1  . albuterol (PROVENTIL) (2.5 MG/3ML) 0.083% nebulizer solution Take 3 mLs (2.5 mg total) by nebulization every 2 (two) hours as needed for wheezing or shortness of breath. 75 mL 12  . amLODipine (NORVASC) 2.5 MG tablet Take 5 mg by mouth daily.    Marland Kitchen atorvastatin (LIPITOR) 20 MG tablet Take 20 mg by mouth at bedtime.    . Calcium-Magnesium-Vitamin D (CALCIUM 1200+D3 PO) Take 2 tablets by mouth daily.    . diazepam (VALIUM) 5 MG tablet Take 2.5 mg by mouth at bedtime as needed for anxiety (for nerves).     . Fluticasone-Salmeterol (ADVAIR DISKUS) 250-50 MCG/DOSE AEPB Inhale 1 puff into the lungs every 12 (twelve) hours. 60 each 0  . gabapentin (NEURONTIN) 100 MG capsule Take 100 mg by mouth 3 (three) times daily.    Marland Kitchen guaiFENesin-dextromethorphan (ROBITUSSIN DM) 100-10 MG/5ML syrup Take 5 mLs  by mouth every 4 (four) hours as needed for cough. 118 mL 0  . HYDROcodone-acetaminophen (NORCO) 5-325 MG per tablet Take 1 tablet by mouth 2 (two) times daily as needed for moderate pain.     Marland Kitchen lisinopril (PRINIVIL,ZESTRIL) 30 MG tablet Take 30 mg by mouth every morning.    . magnesium oxide (MAG-OX) 400 MG tablet Take 500 mg by mouth daily.    . metoCLOPramide (REGLAN) 5 MG tablet Take 5 mg by mouth 2 (two) times daily.     . Multiple Vitamins-Calcium (DAILY VITAMINS FOR WOMEN PO) Take 1 tablet by mouth daily.    . pantoprazole (PROTONIX) 40 MG tablet Take 1 tablet (40 mg total) by mouth daily at 6 (six) AM. 30 tablet 0  . raloxifene (EVISTA) 60 MG tablet Take 60 mg by mouth daily.    . sertraline (ZOLOFT) 100 MG tablet Take 100 mg by mouth daily.    Marland Kitchen tiotropium (SPIRIVA) 18 MCG inhalation capsule Place 18 mcg into inhaler and inhale daily.       No current facility-administered medications for this visit.     Functional Status:  In your present state of health, do you have any difficulty performing the following activities: 03/18/2016 09/30/2015  Hearing? N N  Vision? N N  Difficulty concentrating or making decisions? N N  Walking or climbing stairs? Y N  Dressing or bathing? N N  Doing errands, shopping? N N  Preparing Food and eating ? N -  Using the Toilet? N -  In the past six months, have you accidently leaked urine? N -  Do you have problems with loss of bowel control? N -  Managing your Medications? N -  Managing your Finances? N -  Housekeeping or managing your Housekeeping? Y -  Some recent data might be hidden    Fall/Depression Screening: PHQ 2/9 Scores 03/18/2016 09/30/2015 08/17/2015 07/13/2015 05/05/2015 05/02/2015  PHQ - 2 Score 0 _0 0  PHQ- 9 Score - - - - 12 -   THN CM Care Plan Problem One   Flowsheet Row Most Recent Value  Care Plan Problem One  Knowledge deficit of self management of COPD  Role Documenting the Problem One  [facility] [place] for  Problem One  Active  THN Long Term Goal (31-90 days)  Patient will not have any readmissions for copd in the next 90 days  THN Long Term Goal Start Date  03/18/16  Interventions for Problem One [place] sent  patient educational material on COPD. RN Health Coach will do follow up outreach calls within a month . RN discussed taking medications as per physiciain orders  THN CM Short Term Goal #2 (0-30 days)  Patient will continue  to verbalize using the inhalers the correct way and adhering to medication times  THN CM Short Term Goal #2 Start Date  03/18/16  [facility] CM Short Term Goal #2 Met Date  03/18/16  Interventions for Short Term Goal #2  RN sent patient EMMI on using inhalers the right way. RN Health Coach will  have  continue follow up discussion on the proper technique  THN CM Short Term Goal #3 (0-30 days)  Patient will verbalize medication adherence within 30 days  THN CM Short Term Goal #3 Met Date  03/18/16  Interventions for Short Tern Goal #3  RN will discuss with the patient importance of medication adherence  THN CM Short Term Goal #4 (0-30 days)  Patient will verbalize that she has made an appointment  with a pulmonologist with in the next 30 days  THN CM Short Term Goal #4 Start Date  03/18/16  [facility] CM Short Term Goal #4 Met Date  03/18/16  Interventions for Short Term Goal #4  Patient is following up with her PCP for Pulmonologist  referral. RN will follow up with patient to make sure Pulmonary Dr obtained  [facility] CM Short Term Goal #5 (0-30 days)  Patient will report maintaining her over health with regular checkups within the next 30 days  THN CM Short Term Goal #5 Start Date  03/18/16  Interventions for Short Term Goal #5  RN disucssed with patient about eye exams, flu and pneumonia shots and dental exams. RN will follow up with patient for appointments      Assessment:  Patient has not had eye exam in 5 years Patient is using rescue inhalers Patient will  continue to benefit from Health Coach telephonic outreach for education and support for COPD self management.  Plan:  RN send educational material on [employer] exhaustion RN will send educational material on keeping elderly hydrated RN will follow up within the month of September  [facility] [place] [facility] [phone]

## 2016-03-21 ENCOUNTER — Inpatient Hospital Stay: Admission: RE | Admit: 2016-03-21 | Payer: Commercial Managed Care - HMO | Source: Ambulatory Visit

## 2016-03-21 ENCOUNTER — Ambulatory Visit (INDEPENDENT_AMBULATORY_CARE_PROVIDER_SITE_OTHER): Payer: Commercial Managed Care - HMO | Admitting: Acute Care

## 2016-03-21 ENCOUNTER — Encounter: Payer: Self-pay | Admitting: Acute Care

## 2016-03-21 DIAGNOSIS — Z87891 Personal history of nicotine dependence: Secondary | ICD-10-CM

## 2016-03-21 NOTE — Progress Notes (Signed)
Shared Decision Making Visit Lung Cancer Screening Program [phone])   Eligibility:  Age 75 y.o.  Pack Years Smoking History Calculation 102 pack year history (# packs/per year x # years smoked)  Recent History of coughing up blood  no  Unexplained weight loss? no ( >Than 15 pounds within the last 6 months )  Prior History Lung / other cancer no (Diagnosis within the last 5 years already requiring surveillance chest CT Scans).  Smoking Status Former Smoker  Former Smokers: Years since quit:Four  Quit Date: 2-13  Visit Components:  Discussion included one or more decision making aids. yes  Discussion included risk/benefits of screening. yes  Discussion included potential follow up diagnostic testing for abnormal scans. yes  Discussion included meaning and risk of over diagnosis. yes  Discussion included meaning and risk of False Positives. yes  Discussion included meaning of total radiation exposure. yes  Counseling Included:  Importance of adherence to annual lung cancer LDCT screening. yes  Impact of comorbidities on ability to participate in the program. yes  Ability and willingness to under diagnostic treatment. yes  Smoking Cessation Counseling:  Current Smokers:   Discussed importance of smoking cessation. NA Former smoker  Information about tobacco cessation classes and interventions provided to patient. NA  Patient provided with "ticket" for LDCT Scan. yes  Symptomatic Patient. no  Counseling  Diagnosis Code: Tobacco Use Z72.0  Asymptomatic Patient yes  Counseling Former smoker  Former Smokers:   Discussed the importance of maintaining cigarette abstinence. yes  Diagnosis Code: Personal History of Nicotine Dependence. I94.854  Information about tobacco cessation classes and interventions provided to patient. Yes  Patient provided with "ticket" for LDCT Scan. yes  Written Order for Lung Cancer Screening with LDCT placed in Epic. Yes (CT  Chest Lung Cancer Screening Low Dose W/O CM) [id number] Z12.2-Screening of respiratory organs Z87.891-Personal history of nicotine dependence  I spent 20 minutes of face to face time with [name] discussing the risks and benefits of lung cancer screening. We viewed a power point together that explained in detail the above noted topics. We took the time to pause the power point at intervals to allow for questions to be asked and answered to ensure understanding. We discussed that she had taken the single most powerful action possible to decrease her risk of developing lung cancer when she quit smoking. I counseled her to remain smoke free, and to contact me if she ever had the desire to smoke again so that I can provide resources and tools to help support the effort to remain smoke free. We discussed the time and location of the scan, and that either [place] or I will call with the results within  24-48 hours of receiving them. She has my card and contact information in the event she needs to speak with me, in addition to a copy of the power point we reviewed as a resource. She verbalized understanding of all of the above and had no further questions upon leaving the office.    Magdalen Spatz, NP 03/21/2016

## 2016-03-23 ENCOUNTER — Other Ambulatory Visit: Payer: Self-pay | Admitting: Internal Medicine

## 2016-03-23 DIAGNOSIS — N644 Mastodynia: Secondary | ICD-10-CM

## 2016-03-24 ENCOUNTER — Ambulatory Visit (INDEPENDENT_AMBULATORY_CARE_PROVIDER_SITE_OTHER)
Admission: RE | Admit: 2016-03-24 | Discharge: 2016-03-24 | Disposition: A | Discharge status: Home / Self Care | Payer: Commercial Managed Care - HMO | Source: Ambulatory Visit | Attending: Acute Care | Admitting: Acute Care

## 2016-03-24 ENCOUNTER — Ambulatory Visit: Admission: RE | Admit: 2016-03-24 | Payer: Commercial Managed Care - HMO | Source: Ambulatory Visit

## 2016-03-24 DIAGNOSIS — Z87891 Personal history of nicotine dependence: Secondary | ICD-10-CM

## 2016-03-24 IMAGING — CT CT CHEST LUNG CANCER SCREENING LOW DOSE W/O CM
2 of 4 series · 15 of 40 positions shown, 18 images · non-contrast
Comparison: Standard CT chest [DATE]

CLINICAL DATA: 74-year-old female with 102 pack-year history of
smoking. Lung cancer screening.

EXAM:
CT CHEST WITHOUT CONTRAST LOW-DOSE FOR LUNG CANCER SCREENING
TECHNIQUE: Multidetector CT imaging of the chest was performed following the
standard protocol without IV contrast.

[Series 2: thorax 5.0 i31f 3 · axial · 0.75mm/px · z∈[+1126,+1361]mm · 12 of 57 slices shown, 15 images]
[im 5/57  mediastinal]
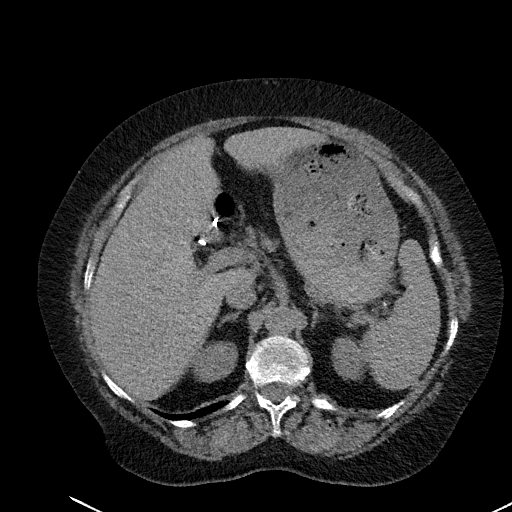
[im 5/57  lung]
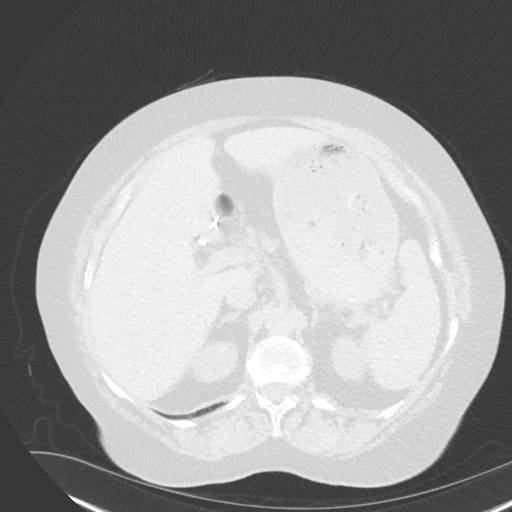
[im 9/57  lung]
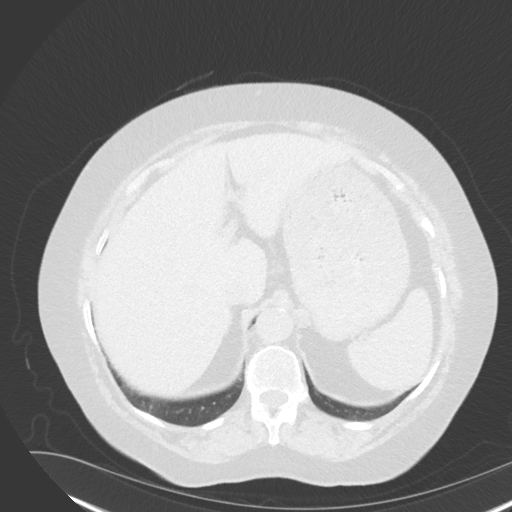
[im 13/57  lung]
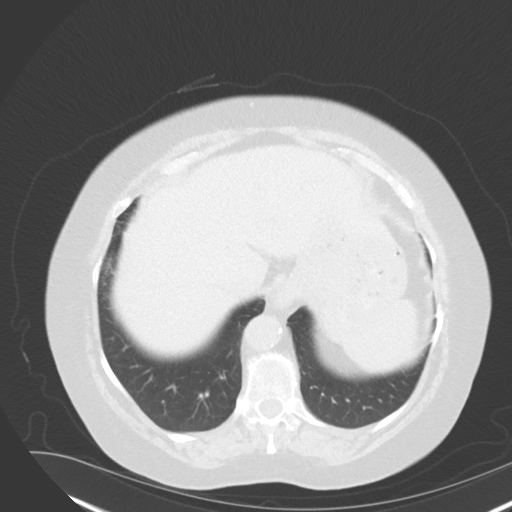
[im 18/57  lung]
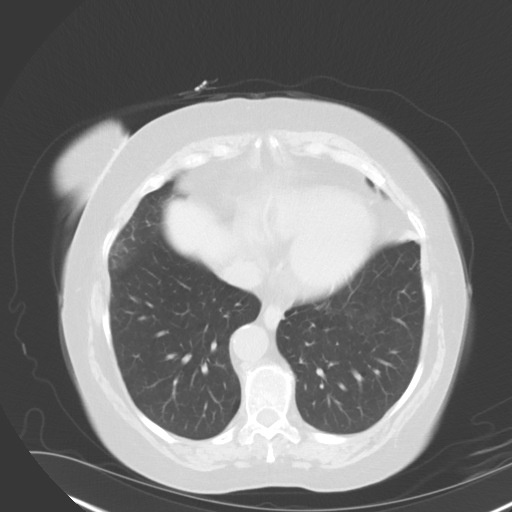
[im 22/57  mediastinal]
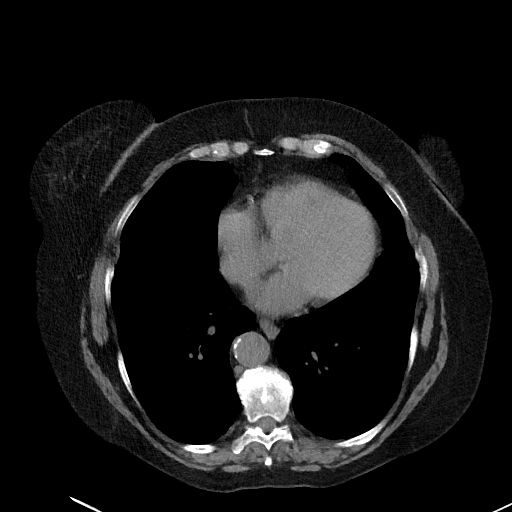
[im 22/57  lung]
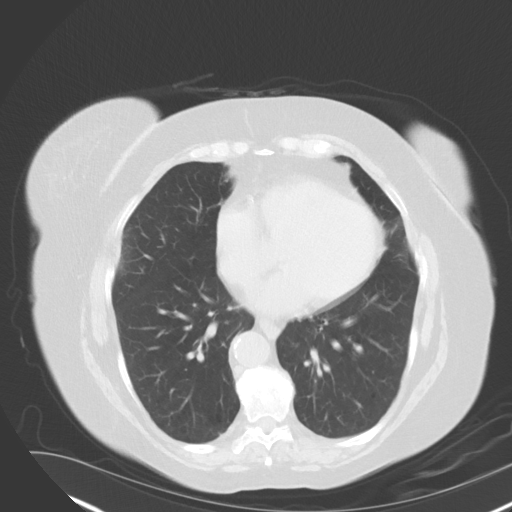
[im 26/57  lung]
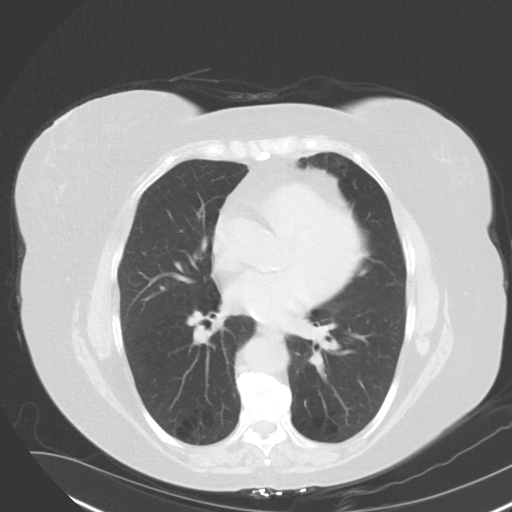
[im 31/57  lung]
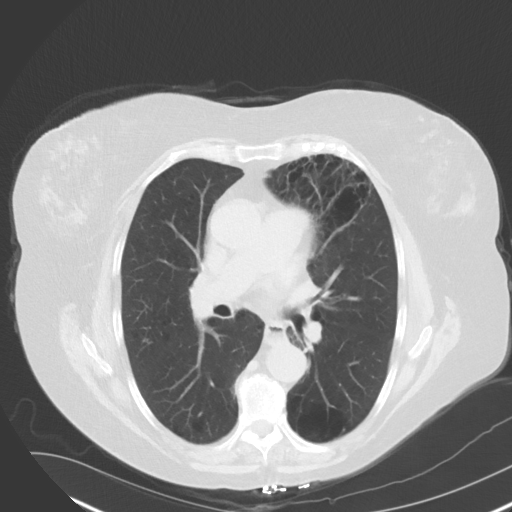
[im 35/57  lung]
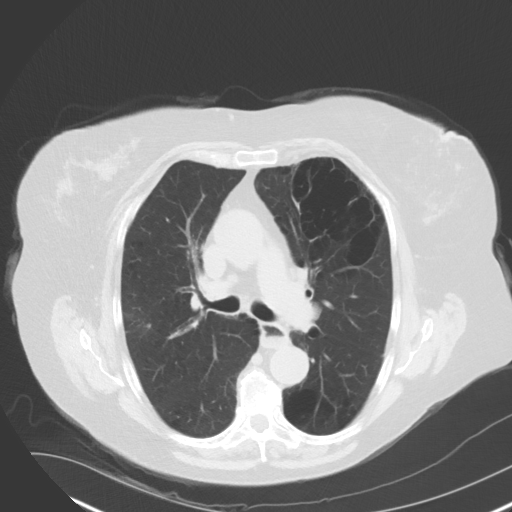
[im 39/57  mediastinal]
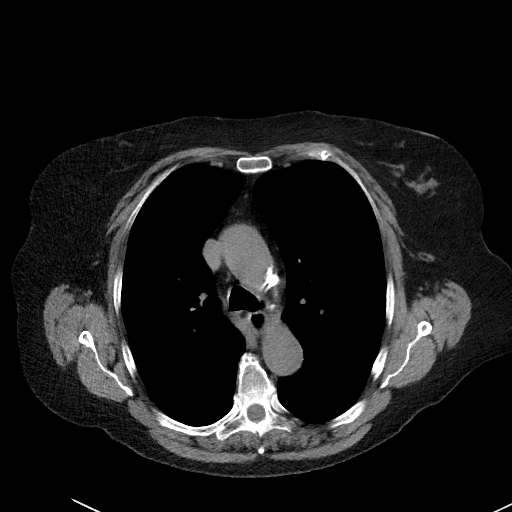
[im 39/57  lung]
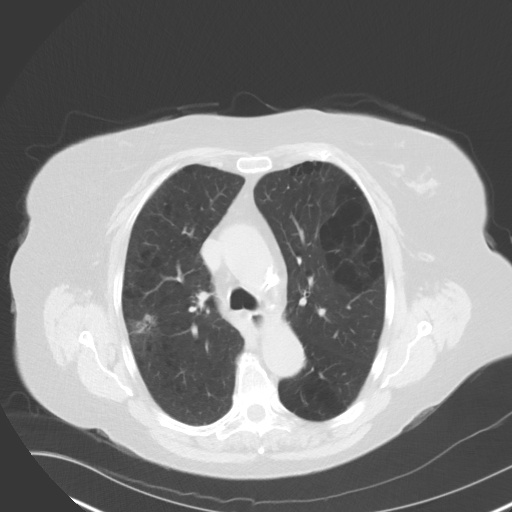
[im 44/57  lung]
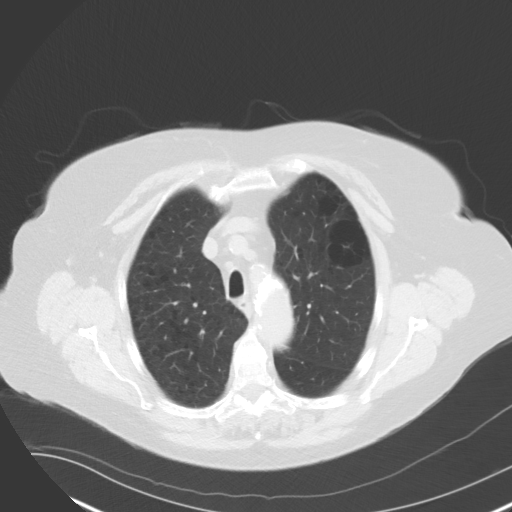
[im 48/57  lung]
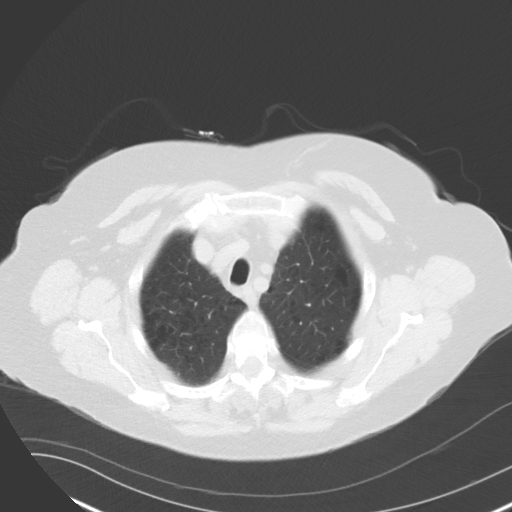
[im 52/57  lung]
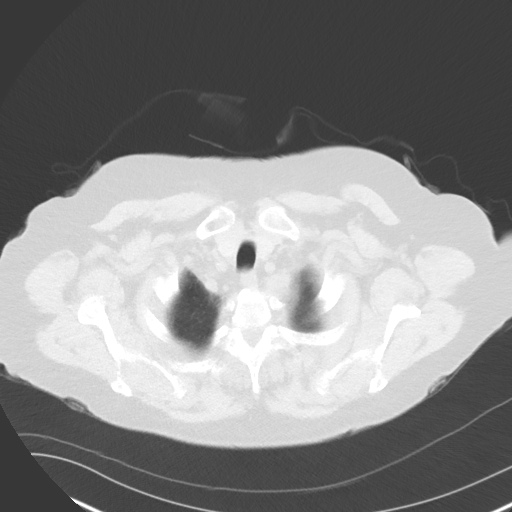

[Series 5: coronal · coronal · 0.59mm/px · 3 of 151 slices shown]
[im 31/151  lung]
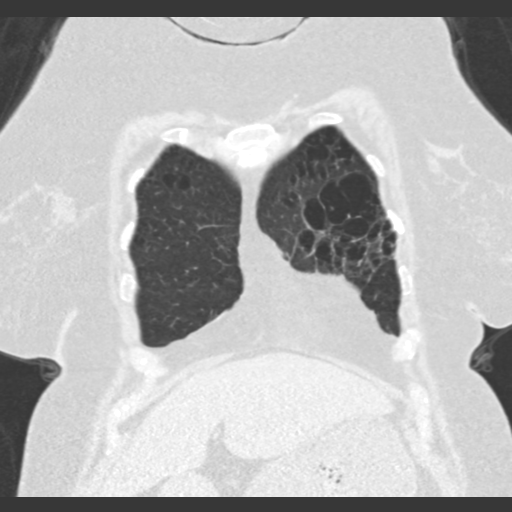
[im 61/151  lung]
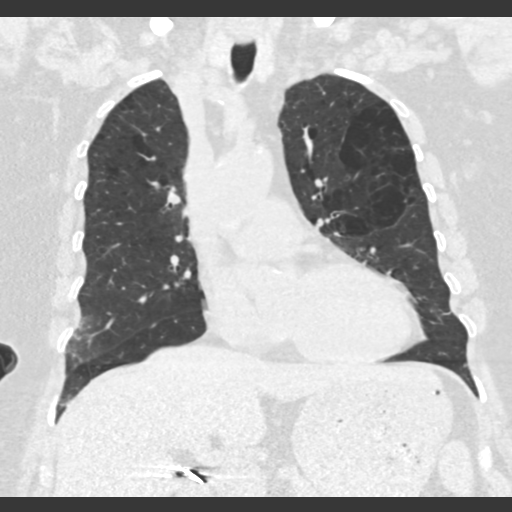
[im 91/151  lung]
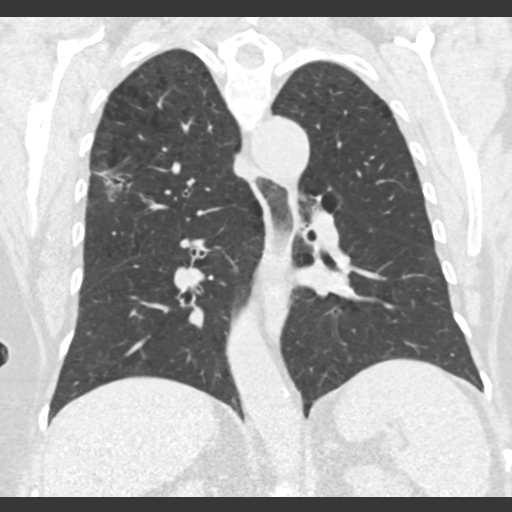

[15 of 40 positions shown; findings below may reference images not displayed]

FINDINGS: Cardiovascular: Heart size is normal. No pericardial effusion.
Coronary artery calcification is noted. Atherosclerotic
calcification is noted in the wall of the throracic aorta.

Mediastinum/Nodes: No mediastinal lymphadenopathy. No evidence for
gross hilar lymphadenopathy although assessment is limited by the
lack of intravenous contrast on today's study. The esophagus has
normal imaging features. There is no axillary lymphadenopathy.

Lungs/Pleura: Moderate changes of emphysema noted bilaterally with
bullous involvement in the left upper lobe. Bronchial wall
thickening noted. New since the prior study, a mixed solid and sub
solid lesion is identified in the lateral aspect of the right upper
lobe, tethered to the peripheral pleura. Volume derived mean
diameter for this lesion is 21.7 mm. Numerous other scattered small
pulmonary nodules are seen bilaterally.

Upper abdomen: 18 mm probable cyst upper pole right kidney
incompletely visualized. Smaller cyst was seen at this location
previously. Small probable cyst upper pole left kidney, also
incompletely visualized and minimally progressed in the interval.
Otherwise unremarkable.

Musculoskeletal: Bone windows reveal no worrisome lytic or sclerotic
osseous lesions.
IMPRESSION: 1. Lung-RADS Category 4B, suspicious. Additional imaging evaluation
or consultation with pulmonary medicine or thoracic surgery
recommended
2. Emphysema.
3. Coronary artery and thoracic aortic atherosclerosis.
These results will be called to the ordering clinician or
representative by the Radiologist Assistant, and communication
documented in the PACS or zVision Dashboard.

## 2016-03-25 ENCOUNTER — Telehealth: Payer: Self-pay | Admitting: Acute Care

## 2016-03-25 DIAGNOSIS — R911 Solitary pulmonary nodule: Secondary | ICD-10-CM

## 2016-03-25 NOTE — Telephone Encounter (Signed)
Sarah please see the Low Dose Lung CA Screen CT results - call report received today from Opal Sidles at [facility].  Impressions : RADS 4

## 2016-03-25 NOTE — Telephone Encounter (Signed)
I have called the results of [name]'s CT scan to her. I explained that there was a finding that was read as a Lung RADS 4 B indicating suspicious findings for which additional diagnostic testing and or tissue sampling is recommended.I told her that I am ordering a PET scan to be done as soon as it can be scheduled. I told her we will bring her into the office to go over the results once we have completed the PET scan, and then determine the next steps we need to take. I will fax a copy of the scan to her PCP. She verbalized understanding and had no further questions upon completing the call.

## 2016-03-30 ENCOUNTER — Ambulatory Visit: Payer: Self-pay | Admitting: *Deleted

## 2016-04-05 ENCOUNTER — Ambulatory Visit (HOSPITAL_COMMUNITY)
Admission: RE | Admit: 2016-04-05 | Discharge: 2016-04-05 | Disposition: A | Discharge status: Home / Self Care | Payer: Commercial Managed Care - HMO | Source: Ambulatory Visit | Attending: Acute Care | Admitting: Acute Care

## 2016-04-05 DIAGNOSIS — I7090 Unspecified atherosclerosis: Secondary | ICD-10-CM | POA: Insufficient documentation

## 2016-04-05 DIAGNOSIS — N281 Cyst of kidney, acquired: Secondary | ICD-10-CM | POA: Diagnosis not present

## 2016-04-05 DIAGNOSIS — J439 Emphysema, unspecified: Secondary | ICD-10-CM | POA: Diagnosis not present

## 2016-04-05 DIAGNOSIS — R911 Solitary pulmonary nodule: Secondary | ICD-10-CM

## 2016-04-05 LAB — GLUCOSE, CAPILLARY: Glucose-Capillary: 95 mg/dL (ref 65–99)

## 2016-04-05 IMAGING — PT NM PET TUM IMG INITIAL (PI) SKULL BASE T - THIGH
1 of 8 series · 1 of 25 positions shown · non-contrast
Comparison: [DATE]

CLINICAL DATA: Initial treatment strategy for solitary pulmonary
nodule.

EXAM:
NUCLEAR MEDICINE PET SKULL BASE TO THIGH
TECHNIQUE: 8.2 mCi F-18 FDG was injected intravenously. Full-ring PET imaging
was performed from the skull base to thigh after the radiotracer. CT
data was obtained and used for attenuation correction and anatomic
localization.
FASTING BLOOD GLUCOSE:  Value: 95 mg/dl

[Series 4: ct sk_thigh 5.0 b31f · axial · 5.0mm · 0.98mm/px · 1 of 212 slices shown]
[im 212/212  brain]
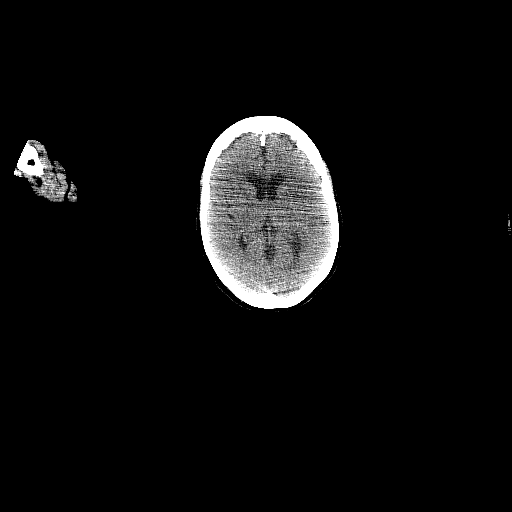

[1 of 25 positions shown; findings below may reference images not displayed]

FINDINGS: NECK

No hypermetabolic lymph nodes in the neck.

CHEST

[DATE] by 1.8 cm solid and sub solid lesion of the right upper lobe on
image 23 of series 8 has a maximum standard uptake value of 5.9.

Coronary, aortic arch, and branch vessel atherosclerotic vascular
disease. Emphysema noted.

ABDOMEN/PELVIS

Photopenic cysts of the kidneys. No abnormal hypermetabolic activity
suggestive of malignancy in the abdomen or pelvis. Physiologic
activity in the bowel.

Cholecystectomy.  Aortoiliac atherosclerotic vascular disease.

SKELETON

No significant abnormal hypermetabolic activity in the bones to
suggest osseous metastatic disease. Cannulated screws in the left
hip. Right pubic ramus deformities from old healed fractures.
Partial fusion of the right sacroiliac joint. Bilateral ray cages at
L4-5.
IMPRESSION: 1. The 2 cm solid and sub solid lesion of the right upper lobe is
hypermetabolic, with maximum standard uptake value 5.9, compatible
with malignancy. No hypermetabolic adenopathy or distant metastatic
disease identified.
2. Emphysema.
3. Bilateral renal cysts.
4. Coronary, aortic arch, and branch vessel atherosclerotic vascular
disease. Aortoiliac atherosclerotic vascular disease.

## 2016-04-05 MED ORDER — FLUDEOXYGLUCOSE F - 18 (FDG) INJECTION
8.2000 | Freq: Once | INTRAVENOUS | Status: AC | PRN
  Administered 2016-04-05: 8.2 via INTRAVENOUS

## 2016-04-07 ENCOUNTER — Encounter: Payer: Self-pay | Admitting: Acute Care

## 2016-04-07 ENCOUNTER — Telehealth: Payer: Self-pay | Admitting: Acute Care

## 2016-04-07 NOTE — Telephone Encounter (Signed)
I have called [name] with the results of her PET scan. I explained to her that the lung nodule that we noted on her screening CT was suspicious for malignancy, but that we needed to do a biopsy to confirm. I explained to her that I have already scheduled her to be presented at the thoracic conference on 9/21 so that we can evaluate the best method of biopsy. She has verbalized understanding, and had no further questions at completion of the call. I made sure she has my contact number in the event she has any further questions.

## 2016-04-13 ENCOUNTER — Telehealth: Payer: Self-pay | Admitting: *Deleted

## 2016-04-13 NOTE — Telephone Encounter (Signed)
Call from "[name] asking why we have not sent xray results to Dr. Deforest Hoyles.  I was there for xr-ays."  Advised this is not the hospital and to call her doctor's office for results.  "I'll come over there tomorrow.  I was down that hall getting x-rays."  Not able to tell me a doctor's name.  Currently not at patient at [facility].

## 2016-04-14 ENCOUNTER — Telehealth: Payer: Self-pay | Admitting: Acute Care

## 2016-04-14 DIAGNOSIS — R911 Solitary pulmonary nodule: Secondary | ICD-10-CM

## 2016-04-14 NOTE — Telephone Encounter (Signed)
I called [name] to inform her of the recommendations made by the  [facility] this morning after reviewing both her Low Dose CT and her PET scan. I explained that upon review, it was the groups recommendation that we refer her to Thoracic Surgery for evaluation for surgical removal of the lung nodule discovered on LDCT. I explained that based on the characteristics of the nodule the recommendation was for surgical evaluation vs. Biopsy and  surgical evaluation. I explained that this appears to be stage 1A disease ( not based on tissue sampling, but based on CT and PET). She verbalized understanding, and that she will receive a call form [facility] to schedule an appointment for evaluation and possible removal  of her pulmonary nodule.

## 2016-04-20 ENCOUNTER — Encounter: Payer: Commercial Managed Care - HMO | Admitting: Thoracic Surgery (Cardiothoracic Vascular Surgery)

## 2016-04-26 ENCOUNTER — Encounter: Payer: Self-pay | Admitting: *Deleted

## 2016-04-26 ENCOUNTER — Other Ambulatory Visit: Payer: Self-pay | Admitting: *Deleted

## 2016-04-26 ENCOUNTER — Encounter: Payer: Self-pay | Admitting: Thoracic Surgery (Cardiothoracic Vascular Surgery)

## 2016-04-26 ENCOUNTER — Institutional Professional Consult (permissible substitution) (INDEPENDENT_AMBULATORY_CARE_PROVIDER_SITE_OTHER): Payer: Commercial Managed Care - HMO | Admitting: Thoracic Surgery (Cardiothoracic Vascular Surgery)

## 2016-04-26 VITALS — BP 135/82 | HR 80 | Resp 16 | Ht 61.5 in | Wt 160.0 lb

## 2016-04-26 DIAGNOSIS — D381 Neoplasm of uncertain behavior of trachea, bronchus and lung: Secondary | ICD-10-CM | POA: Diagnosis not present

## 2016-04-26 DIAGNOSIS — R911 Solitary pulmonary nodule: Secondary | ICD-10-CM

## 2016-04-26 DIAGNOSIS — I82409 Acute embolism and thrombosis of unspecified deep veins of unspecified lower extremity: Secondary | ICD-10-CM | POA: Insufficient documentation

## 2016-04-26 DIAGNOSIS — I82402 Acute embolism and thrombosis of unspecified deep veins of left lower extremity: Secondary | ICD-10-CM

## 2016-04-26 DIAGNOSIS — R011 Cardiac murmur, unspecified: Secondary | ICD-10-CM | POA: Insufficient documentation
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
## 2016-05-04 NOTE — Pre-Procedure Instructions (Signed)
DARCUS EDDS  05/04/2016      [facility] - [place], [place] - [address] [place] [address] Phone: [phone] Fax: [phone]    Your procedure is scheduled on October 16  Report to [facility] at [facility].M.  Call this number if you have problems the morning of surgery:  [phone]   Remember:  Do not eat food or drink liquids after midnight.   Take these medicines the morning of surgery with A SIP OF WATER albuterol (PROVENTIL HFA;VENTOLIN HFA), amLODipine (NORVASC) , diazepam (VALIUM), Fluticasone-Salmeterol (ADVAIR DISKUS), gabapentin (NEURONTIN), HYDROcodone-acetaminophen (NORCO),  metoCLOPramide (REGLAN) , pantoprazole (PROTONIX), sertraline (ZOLOFT), tiotropium (SPIRIVA),   7 days prior to surgery STOP taking any Aspirin, Aleve, Naproxen, Ibuprofen, Motrin, Advil, Goody's, BC's, all herbal medications, fish oil, and all vitamins    Do not wear jewelry, make-up or nail polish.  Do not wear lotions, powders, or perfumes, or deoderant.  Do not shave 48 hours prior to surgery.    Do not bring valuables to the hospital.  [facility] is not responsible for any belongings or valuables.  Contacts, dentures or bridgework may not be worn into surgery.  Leave your suitcase in the car.  After surgery it may be brought to your room.  For patients admitted to the hospital, discharge time will be determined by your treatment team.  Patients discharged the day of surgery will not be allowed to drive home.    Special instructions:   [facility]- Preparing For Surgery  Before surgery, you can play an important role. Because skin is not sterile, your skin needs to be as free of germs as possible. You can reduce the number of germs on your skin by washing with CHG (chlorahexidine gluconate) Soap before surgery.  CHG is an antiseptic cleaner which kills germs and bonds with the skin to continue killing germs  even after washing.  Please do not use if you have an allergy to CHG or antibacterial soaps. If your skin becomes reddened/irritated stop using the CHG.  Do not shave (including legs and underarms) for at least 48 hours prior to first CHG shower. It is OK to shave your face.  Please follow these instructions carefully.   1. Shower the NIGHT BEFORE SURGERY and the MORNING OF SURGERY with CHG.   2. If you chose to wash your hair, wash your hair first as usual with your normal shampoo.  3. After you shampoo, rinse your hair and body thoroughly to remove the shampoo.  4. Use CHG as you would any other liquid soap. You can apply CHG directly to the skin and wash gently with a scrungie or a clean washcloth.   5. Apply the CHG Soap to your body ONLY FROM THE NECK DOWN.  Do not use on open wounds or open sores. Avoid contact with your eyes, ears, mouth and genitals (private parts). Wash genitals (private parts) with your normal soap.  6. Wash thoroughly, paying special attention to the area where your surgery will be performed.  7. Thoroughly rinse your body with warm water from the neck down.  8. DO NOT shower/wash with your normal soap after using and rinsing off the CHG Soap.  9. Pat yourself dry with a CLEAN TOWEL.   10. Wear CLEAN PAJAMAS   11. Place CLEAN SHEETS on your bed the night of your first shower and DO NOT SLEEP WITH PETS.  Day of Surgery: Do not apply any deodorants/lotions. Please wear clean clothes to the hospital/surgery center.      Please read over the following fact sheets that you were given. Pain Booklet, Coughing and Deep Breathing, Blood Transfusion Information, MRSA Information and Surgical Site Infection Prevention

## 2016-05-05 ENCOUNTER — Other Ambulatory Visit: Payer: Self-pay

## 2016-05-05 ENCOUNTER — Encounter (HOSPITAL_COMMUNITY)
Admission: RE | Admit: 2016-05-05 | Discharge: 2016-05-05 | Disposition: A | Discharge status: Home / Self Care | Payer: Commercial Managed Care - HMO | Source: Ambulatory Visit | Attending: Thoracic Surgery (Cardiothoracic Vascular Surgery) | Admitting: Thoracic Surgery (Cardiothoracic Vascular Surgery)

## 2016-05-05 ENCOUNTER — Encounter (HOSPITAL_COMMUNITY): Payer: Self-pay

## 2016-05-05 DIAGNOSIS — R911 Solitary pulmonary nodule: Secondary | ICD-10-CM | POA: Diagnosis not present

## 2016-05-05 DIAGNOSIS — Z01818 Encounter for other preprocedural examination: Secondary | ICD-10-CM | POA: Diagnosis not present

## 2016-05-05 LAB — BLOOD GAS, ARTERIAL
Acid-base deficit: 0.3 mmol/L (ref 0.0–2.0)
Bicarbonate: 23.1 mmol/L (ref 20.0–28.0)
Drawn by: 406621
FIO2: 21
O2 Saturation: 97 %
Patient temperature: 98.6
pCO2 arterial: 33.1 mmHg (ref 32.0–48.0)
pH, Arterial: 7.458 — ABNORMAL HIGH (ref 7.350–7.450)
pO2, Arterial: 86.7 mmHg (ref 83.0–108.0)

## 2016-05-05 LAB — CBC
HCT: 40.5 % (ref 36.0–46.0)
Hemoglobin: 13.4 g/dL (ref 12.0–15.0)
MCH: 29.5 pg (ref 26.0–34.0)
MCHC: 33.1 g/dL (ref 30.0–36.0)
MCV: 89.2 fL (ref 78.0–100.0)
Platelets: 227 10*3/uL (ref 150–400)
RBC: 4.54 MIL/uL (ref 3.87–5.11)
RDW: 13.5 % (ref 11.5–15.5)
WBC: 6.6 10*3/uL (ref 4.0–10.5)

## 2016-05-05 LAB — URINALYSIS, ROUTINE W REFLEX MICROSCOPIC
Bilirubin Urine: NEGATIVE
Glucose, UA: NEGATIVE mg/dL
Hgb urine dipstick: NEGATIVE
Ketones, ur: NEGATIVE mg/dL
Leukocytes, UA: NEGATIVE
Nitrite: NEGATIVE
Protein, ur: NEGATIVE mg/dL
Specific Gravity, Urine: 1.01 (ref 1.005–1.030)
pH: 6 (ref 5.0–8.0)

## 2016-05-05 LAB — COMPREHENSIVE METABOLIC PANEL
ALT: 18 U/L (ref 14–54)
AST: 26 U/L (ref 15–41)
Albumin: 4 g/dL (ref 3.5–5.0)
Alkaline Phosphatase: 57 U/L (ref 38–126)
Anion gap: 7 (ref 5–15)
BUN: 7 mg/dL (ref 6–20)
CO2: 24 mmol/L (ref 22–32)
Calcium: 9.1 mg/dL (ref 8.9–10.3)
Chloride: 111 mmol/L (ref 101–111)
Creatinine, Ser: 1.28 mg/dL — ABNORMAL HIGH (ref 0.44–1.00)
GFR calc Af Amer: 47 mL/min — ABNORMAL LOW (ref >60)
GFR calc non Af Amer: 40 mL/min — ABNORMAL LOW (ref >60)
Glucose, Bld: 106 mg/dL — ABNORMAL HIGH (ref 65–99)
Potassium: 3.7 mmol/L (ref 3.5–5.1)
Sodium: 142 mmol/L (ref 135–145)
Total Bilirubin: 0.8 mg/dL (ref 0.3–1.2)
Total Protein: 7.2 g/dL (ref 6.5–8.1)

## 2016-05-05 LAB — ABO/RH: ABO/RH(D): A POS

## 2016-05-05 LAB — APTT: aPTT: 30 seconds (ref 24–36)

## 2016-05-05 LAB — SURGICAL PCR SCREEN
MRSA, PCR: NEGATIVE
Staphylococcus aureus: POSITIVE — AB

## 2016-05-05 LAB — PROTIME-INR
INR: 0.99
Prothrombin Time: 13.1 seconds (ref 11.4–15.2)

## 2016-05-05 NOTE — Progress Notes (Signed)
Patient was positive for staph.  Prescription called in at [facility] [phone].  Left message on personal voice mail with patients permission of the results and the prescription

## 2016-05-05 NOTE — Progress Notes (Signed)
PCP - Wenda Low Cardiologist - denies  Chest x-ray - DOS EKG - 05/05/16 Stress Test - > 30 years ECHO - denies Cardiac Cath - denies     Patient denies shortness of breath, fever, cough and chest pain at PAT appointment

## 2016-05-08 NOTE — Anesthesia Preprocedure Evaluation (Addendum)
Anesthesia Evaluation  Patient identified by MRN, date of birth, ID band Patient awake    Reviewed: Allergy & Precautions, NPO status , Patient's Chart, lab work & pertinent test results  History of Anesthesia Complications Negative for: history of anesthetic complications  Airway Mallampati: I  TM Distance: >3 FB Neck ROM: Full  Mouth opening: Limited Mouth Opening  Dental  (+) Upper Dentures, Lower Dentures, Dental Advisory Given   Pulmonary COPD,  COPD inhaler, former smoker (100 pack years),  2 cm mixedsolid and sub-solid nodule in the right upper lobe on a low-dose screening CT.   breath sounds clear to auscultation       Cardiovascular hypertension, Pt. on medications + DVT   Rhythm:Regular Rate:Normal     Neuro/Psych PSYCHIATRIC DISORDERS Anxiety Depression negative neurological ROS     GI/Hepatic Neg liver ROS, GERD  Medicated and Controlled,  Endo/Other  negative endocrine ROS  Renal/GU Renal InsufficiencyRenal disease     Musculoskeletal negative musculoskeletal ROS (+)   Abdominal   Peds  Hematology negative hematology ROS (+)   Anesthesia Other Findings Day of surgery medications reviewed with the patient.  Reproductive/Obstetrics Uterine cancer s/p TAH                           Anesthesia Physical Anesthesia Plan  ASA: III  Anesthesia Plan: General   Post-op Pain Management:    Induction: Intravenous  Airway Management Planned: Double Lumen EBT  Additional Equipment: CVP, Arterial line and Ultrasound Guidance Line Placement  Intra-op Plan:   Post-operative Plan: Possible Post-op intubation/ventilation  Informed Consent: I have reviewed the patients History and Physical, chart, labs and discussed the procedure including the risks, benefits and alternatives for the proposed anesthesia with the patient or authorized representative who has indicated his/her  understanding and acceptance.   Dental advisory given  Plan Discussed with: CRNA, Anesthesiologist and Surgeon  Anesthesia Plan Comments: (Risks/benefits of general anesthesia discussed with patient including risk of damage to teeth, lips, gum, and tongue, nausea/vomiting, allergic reactions to medications, and the possibility of heart attack, stroke and death.  All patient questions answered.  Patient wishes to proceed.)       Anesthesia Quick Evaluation

## 2016-05-09 ENCOUNTER — Inpatient Hospital Stay (HOSPITAL_COMMUNITY): Payer: Commercial Managed Care - HMO | Admitting: Anesthesiology

## 2016-05-09 ENCOUNTER — Encounter: Payer: Self-pay | Admitting: *Deleted

## 2016-05-09 ENCOUNTER — Inpatient Hospital Stay (HOSPITAL_COMMUNITY): Payer: Commercial Managed Care - HMO

## 2016-05-09 ENCOUNTER — Encounter (HOSPITAL_COMMUNITY): Payer: Self-pay | Admitting: *Deleted

## 2016-05-09 ENCOUNTER — Inpatient Hospital Stay (HOSPITAL_COMMUNITY)
Admission: RE | Admit: 2016-05-09 | Discharge: 2016-05-16 | DRG: 164 | Disposition: A | Discharge status: Home Health | Payer: Commercial Managed Care - HMO | Source: Ambulatory Visit | Attending: Thoracic Surgery (Cardiothoracic Vascular Surgery) | Admitting: Thoracic Surgery (Cardiothoracic Vascular Surgery)

## 2016-05-09 ENCOUNTER — Encounter (HOSPITAL_COMMUNITY)
Admission: RE | Disposition: A | Discharge status: Home Health | Payer: Self-pay | Source: Ambulatory Visit | Attending: Thoracic Surgery (Cardiothoracic Vascular Surgery)

## 2016-05-09 DIAGNOSIS — F329 Major depressive disorder, single episode, unspecified: Secondary | ICD-10-CM | POA: Diagnosis present

## 2016-05-09 DIAGNOSIS — R0602 Shortness of breath: Secondary | ICD-10-CM | POA: Diagnosis not present

## 2016-05-09 DIAGNOSIS — K219 Gastro-esophageal reflux disease without esophagitis: Secondary | ICD-10-CM | POA: Diagnosis present

## 2016-05-09 DIAGNOSIS — R928 Other abnormal and inconclusive findings on diagnostic imaging of breast: Secondary | ICD-10-CM | POA: Diagnosis not present

## 2016-05-09 DIAGNOSIS — E876 Hypokalemia: Secondary | ICD-10-CM | POA: Diagnosis not present

## 2016-05-09 DIAGNOSIS — D62 Acute posthemorrhagic anemia: Secondary | ICD-10-CM | POA: Diagnosis present

## 2016-05-09 DIAGNOSIS — E1122 Type 2 diabetes mellitus with diabetic chronic kidney disease: Secondary | ICD-10-CM | POA: Diagnosis not present

## 2016-05-09 DIAGNOSIS — Z8542 Personal history of malignant neoplasm of other parts of uterus: Secondary | ICD-10-CM | POA: Diagnosis not present

## 2016-05-09 DIAGNOSIS — Z4682 Encounter for fitting and adjustment of non-vascular catheter: Secondary | ICD-10-CM | POA: Diagnosis not present

## 2016-05-09 DIAGNOSIS — J60 Coalworker's pneumoconiosis: Secondary | ICD-10-CM | POA: Diagnosis not present

## 2016-05-09 DIAGNOSIS — J9811 Atelectasis: Secondary | ICD-10-CM

## 2016-05-09 DIAGNOSIS — R079 Chest pain, unspecified: Secondary | ICD-10-CM | POA: Diagnosis not present

## 2016-05-09 DIAGNOSIS — N179 Acute kidney failure, unspecified: Secondary | ICD-10-CM | POA: Diagnosis not present

## 2016-05-09 DIAGNOSIS — J181 Lobar pneumonia, unspecified organism: Secondary | ICD-10-CM | POA: Diagnosis not present

## 2016-05-09 DIAGNOSIS — T8182XA Emphysema (subcutaneous) resulting from a procedure, initial encounter: Secondary | ICD-10-CM | POA: Diagnosis not present

## 2016-05-09 DIAGNOSIS — R911 Solitary pulmonary nodule: Secondary | ICD-10-CM | POA: Diagnosis not present

## 2016-05-09 DIAGNOSIS — Z87891 Personal history of nicotine dependence: Secondary | ICD-10-CM

## 2016-05-09 DIAGNOSIS — I129 Hypertensive chronic kidney disease with stage 1 through stage 4 chronic kidney disease, or unspecified chronic kidney disease: Secondary | ICD-10-CM | POA: Diagnosis present

## 2016-05-09 DIAGNOSIS — J449 Chronic obstructive pulmonary disease, unspecified: Secondary | ICD-10-CM | POA: Diagnosis not present

## 2016-05-09 DIAGNOSIS — N189 Chronic kidney disease, unspecified: Secondary | ICD-10-CM | POA: Diagnosis present

## 2016-05-09 DIAGNOSIS — J8410 Pulmonary fibrosis, unspecified: Secondary | ICD-10-CM | POA: Diagnosis not present

## 2016-05-09 DIAGNOSIS — Z902 Acquired absence of lung [part of]: Secondary | ICD-10-CM

## 2016-05-09 DIAGNOSIS — J9382 Other air leak: Secondary | ICD-10-CM | POA: Diagnosis not present

## 2016-05-09 DIAGNOSIS — F419 Anxiety disorder, unspecified: Secondary | ICD-10-CM | POA: Diagnosis present

## 2016-05-09 DIAGNOSIS — J939 Pneumothorax, unspecified: Secondary | ICD-10-CM

## 2016-05-09 DIAGNOSIS — C3411 Malignant neoplasm of upper lobe, right bronchus or lung: Principal | ICD-10-CM | POA: Diagnosis present

## 2016-05-09 DIAGNOSIS — Z9689 Presence of other specified functional implants: Secondary | ICD-10-CM

## 2016-05-09 DIAGNOSIS — M545 Low back pain: Secondary | ICD-10-CM | POA: Diagnosis not present

## 2016-05-09 HISTORY — PX: LOBECTOMY: SHX5089

## 2016-05-09 HISTORY — PX: VIDEO ASSISTED THORACOSCOPY (VATS)/WEDGE RESECTION: SHX6174

## 2016-05-09 LAB — PREPARE RBC (CROSSMATCH)

## 2016-05-09 LAB — POCT I-STAT 7, (LYTES, BLD GAS, ICA,H+H)
Bicarbonate: 26.5 mmol/L (ref 20.0–28.0)
Bicarbonate: 27.1 mmol/L (ref 20.0–28.0)
Calcium, Ion: 1.14 mmol/L — ABNORMAL LOW (ref 1.15–1.40)
Calcium, Ion: 1.15 mmol/L (ref 1.15–1.40)
HCT: 28 % — ABNORMAL LOW (ref 36.0–46.0)
HCT: 29 % — ABNORMAL LOW (ref 36.0–46.0)
Hemoglobin: 9.5 g/dL — ABNORMAL LOW (ref 12.0–15.0)
Hemoglobin: 9.9 g/dL — ABNORMAL LOW (ref 12.0–15.0)
O2 Saturation: 100 %
O2 Saturation: 99 %
Patient temperature: 35.8
Patient temperature: 36
Potassium: 3.8 mmol/L (ref 3.5–5.1)
Potassium: 3.9 mmol/L (ref 3.5–5.1)
Sodium: 141 mmol/L (ref 135–145)
Sodium: 141 mmol/L (ref 135–145)
TCO2: 28 mmol/L (ref 0–100)
TCO2: 29 mmol/L (ref 0–100)
pCO2 arterial: 50.5 mmHg — ABNORMAL HIGH (ref 32.0–48.0)
pCO2 arterial: 55.4 mmHg — ABNORMAL HIGH (ref 32.0–48.0)
pH, Arterial: 7.29 — ABNORMAL LOW (ref 7.350–7.450)
pH, Arterial: 7.324 — ABNORMAL LOW (ref 7.350–7.450)
pO2, Arterial: 142 mmHg — ABNORMAL HIGH (ref 83.0–108.0)
pO2, Arterial: 225 mmHg — ABNORMAL HIGH (ref 83.0–108.0)

## 2016-05-09 LAB — GLUCOSE, CAPILLARY: Glucose-Capillary: 138 mg/dL — ABNORMAL HIGH (ref 65–99)

## 2016-05-09 IMAGING — CR DG CHEST 1V PORT
1 series · 1 of 1 positions shown · non-contrast
Comparison: Radiograph of same day.

CLINICAL DATA: Status post right lobectomy.

EXAM:
PORTABLE CHEST 1 VIEW

[AP]
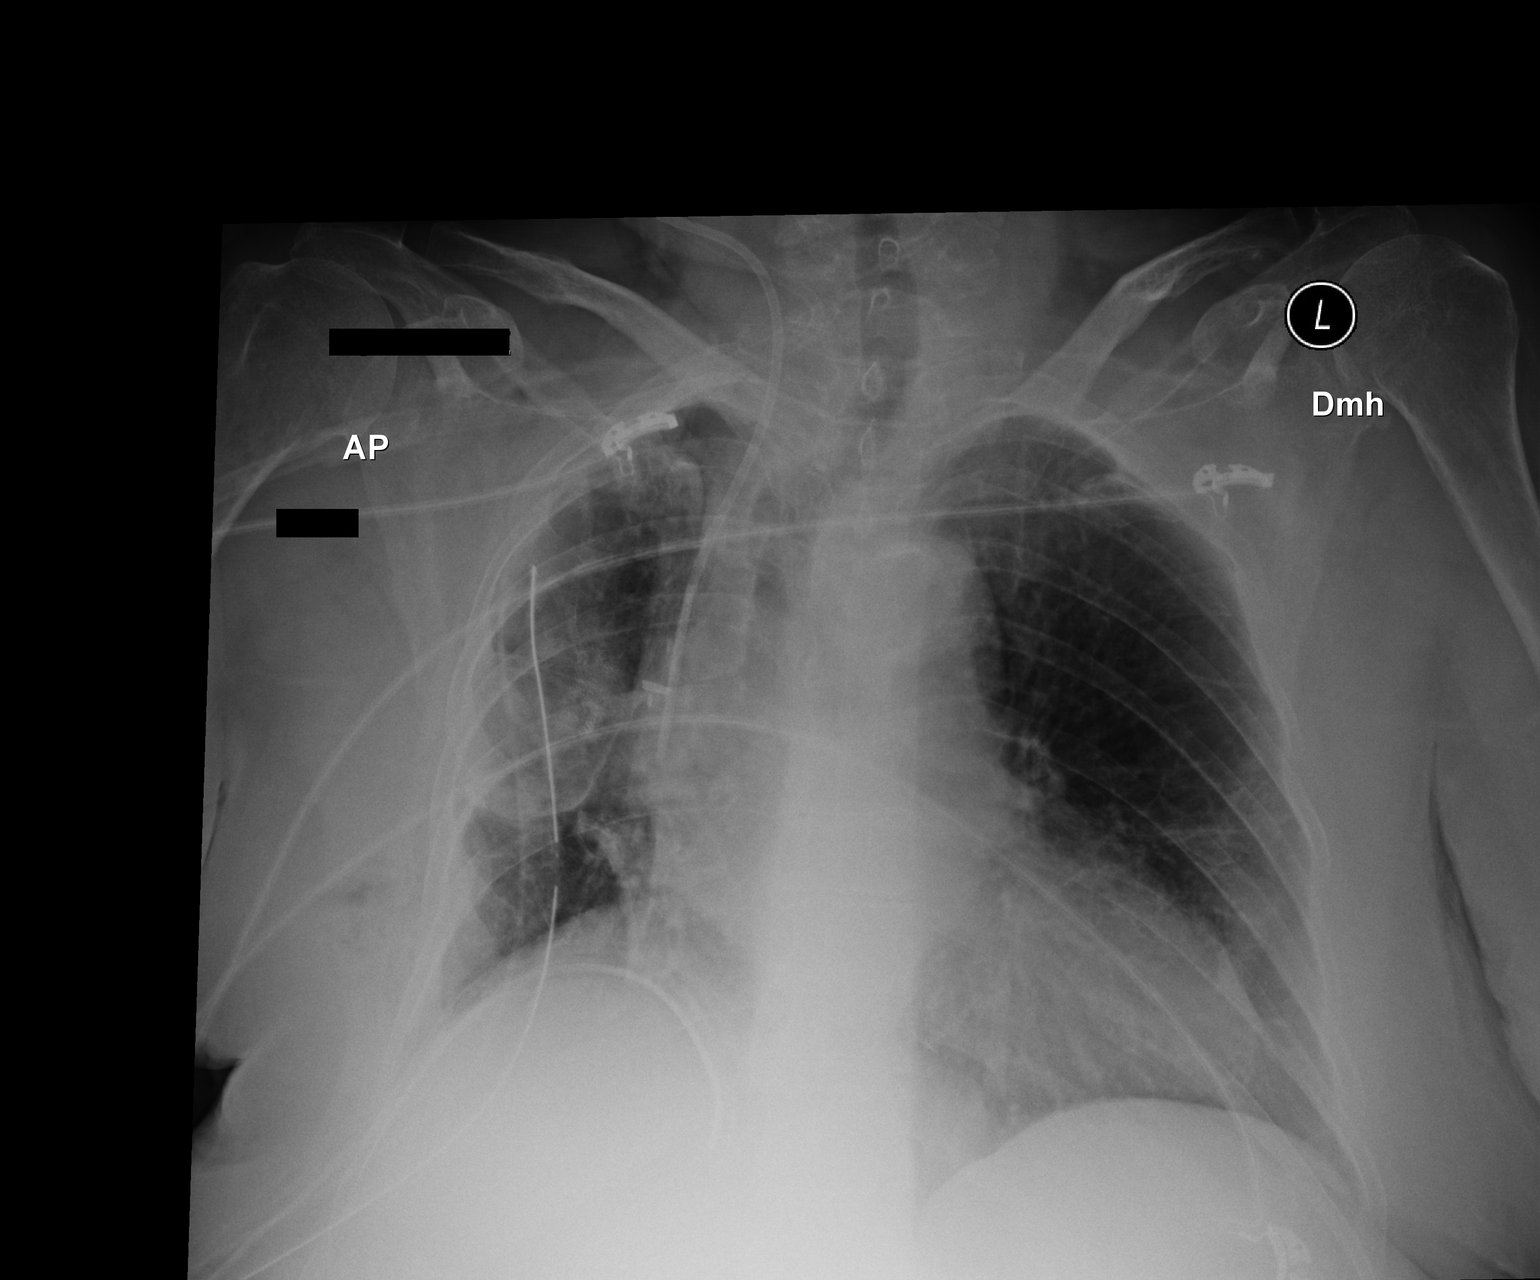

[1 of 1 positions shown; findings below may reference images not displayed]

FINDINGS: Stable cardiomediastinal silhouette. Atherosclerosis of thoracic
aorta is noted. Left lung is clear. Two right-sided chest tubes are
noted status post right-sided lobectomy. Postsurgical changes are
seen in the right midlung. Right internal jugular catheter is noted
with distal tip in expected position of the SVC. No definite
pneumothorax is noted. Small amount of subcutaneous emphysema is
seen in right lateral chest wall.
IMPRESSION: Status post right lobectomy. Two right-sided chest tubes are noted
without pneumothorax. Postsurgical changes are noted. Aortic
atherosclerosis.

## 2016-05-09 IMAGING — CR DG CHEST 2V
2 series · 2 of 2 positions shown · non-contrast
Comparison: Chest radiograph dated [DATE] and CT delayed
[DATE]

CLINICAL DATA: 75-year-old female with cough. Preop for right lung
nodule.

EXAM:
CHEST  2 VIEW

[w chest pa]
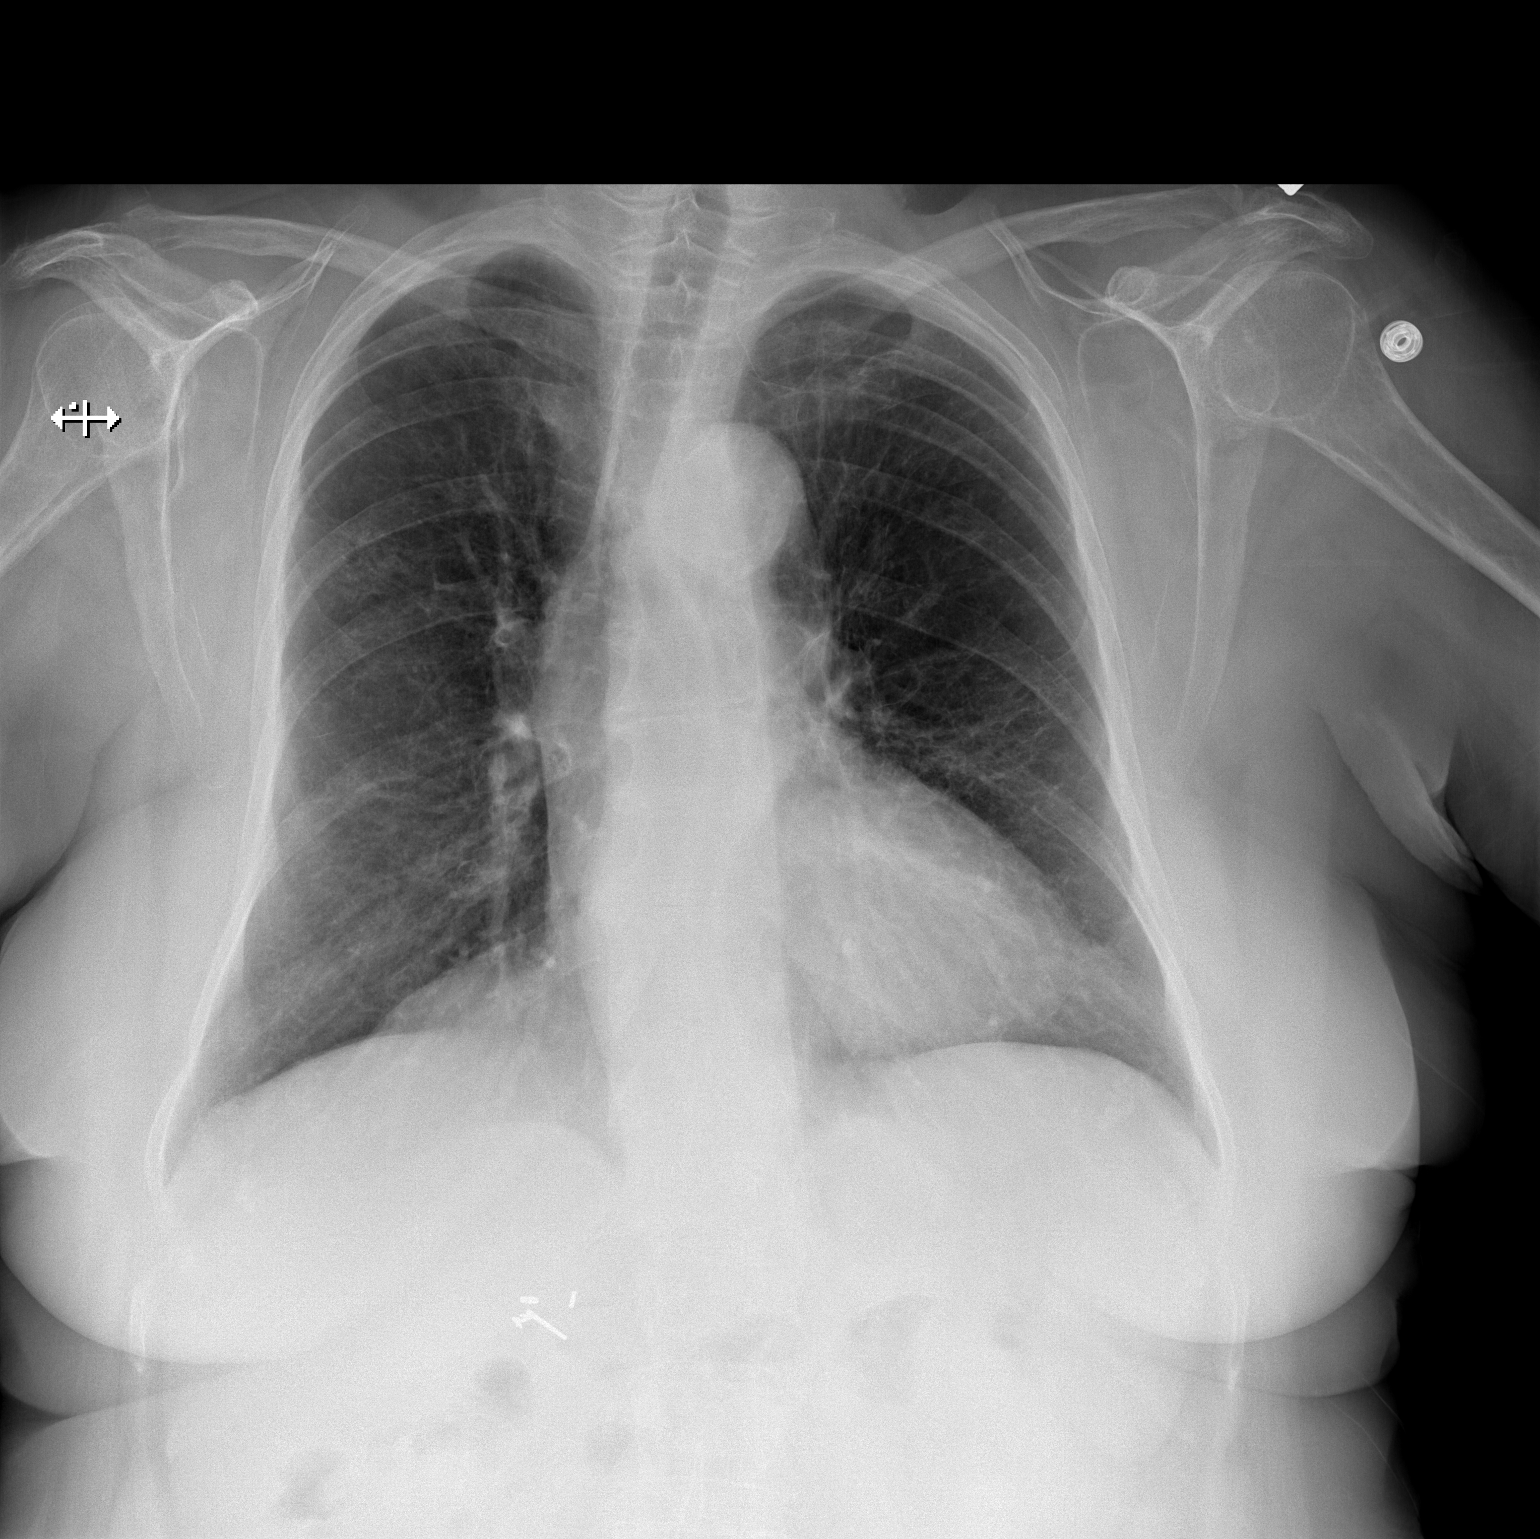

[w chest lat]
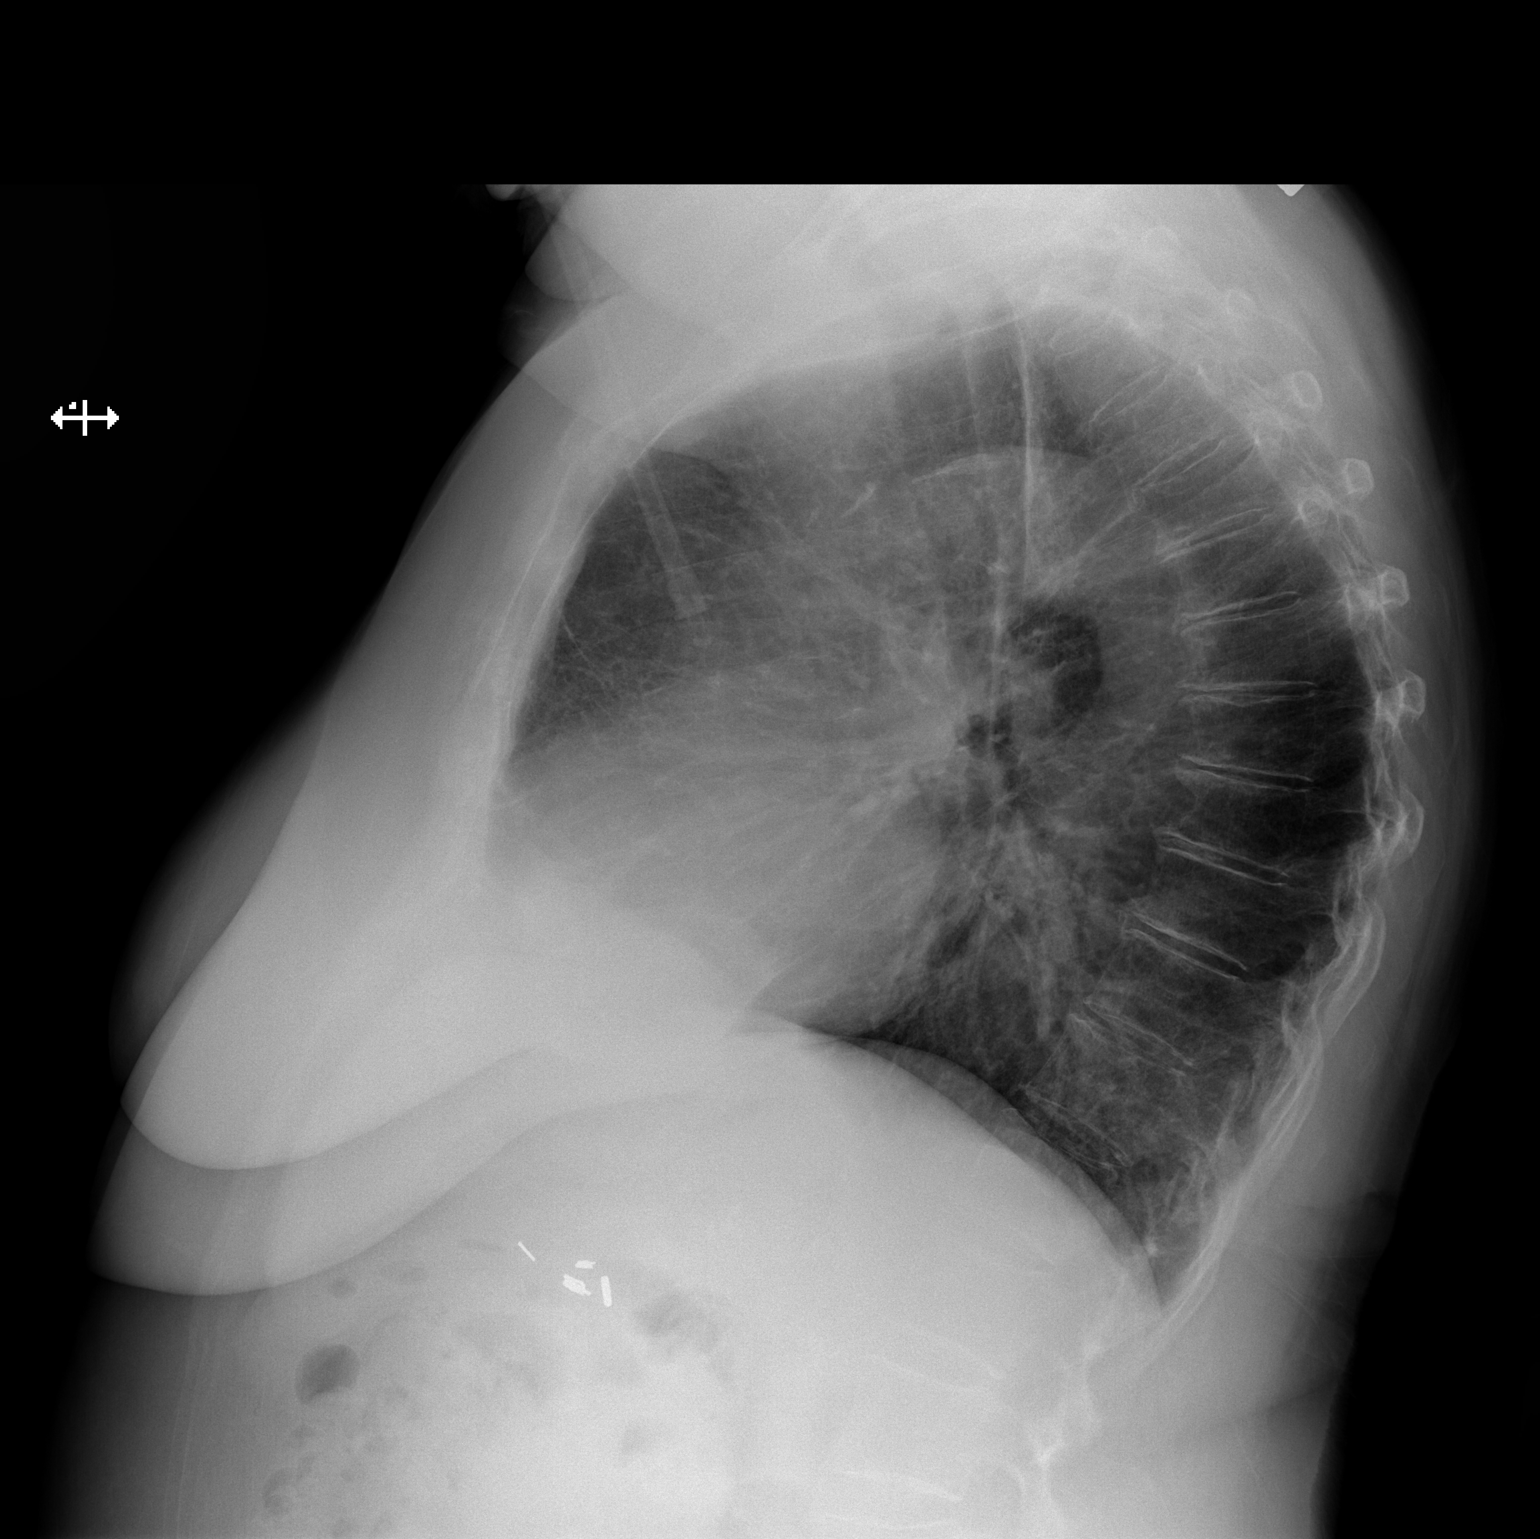

[2 of 2 positions shown; findings below may reference images not displayed]

FINDINGS: There is emphysematous changes of the lungs. Ill-defined
ground-glass nodular density in the right upper lung field
corresponds to the nodular density seen on the prior CT. The lungs
are otherwise clear. No pleural effusion or pneumothorax. Cardiac
silhouette is within normal limits. No acute osseous pathology.

Right upper quadrant cholecystectomy clips.
IMPRESSION: No acute cardiopulmonary process.

Faintly visualized right upper lobe ground-glass nodule.

## 2016-05-09 SURGERY — VIDEO ASSISTED THORACOSCOPY (VATS)/WEDGE RESECTION
Anesthesia: General | Site: Chest | Laterality: Right

## 2016-05-09 MED ORDER — DEXTROSE 5 % IV SOLN
1.5000 g | INTRAVENOUS | Status: AC
  Administered 2016-05-09: 1.5 g via INTRAVENOUS
  Filled 2016-05-09: qty 1.5

## 2016-05-09 MED ORDER — 0.9 % SODIUM CHLORIDE (POUR BTL) OPTIME
TOPICAL | Status: DC | PRN
  Administered 2016-05-09: 1000 mL

## 2016-05-09 MED ORDER — ALBUTEROL SULFATE HFA 108 (90 BASE) MCG/ACT IN AERS: 2.0000 | INHALATION_SPRAY | RESPIRATORY_TRACT | Status: DC | PRN

## 2016-05-09 MED ORDER — EPHEDRINE 5 MG/ML INJ
INTRAVENOUS | Status: AC
  Filled 2016-05-09: qty 10

## 2016-05-09 MED ORDER — FENTANYL 40 MCG/ML IV SOLN
INTRAVENOUS | Status: AC
  Filled 2016-05-09: qty 25

## 2016-05-09 MED ORDER — BUPIVACAINE HCL (PF) 0.5 % IJ SOLN
INTRAMUSCULAR | Status: DC | PRN
  Administered 2016-05-09: 10 mL

## 2016-05-09 MED ORDER — MIDAZOLAM HCL 2 MG/2ML IJ SOLN
INTRAMUSCULAR | Status: AC
  Filled 2016-05-09: qty 2

## 2016-05-09 MED ORDER — SUCCINYLCHOLINE CHLORIDE 200 MG/10ML IV SOSY
PREFILLED_SYRINGE | INTRAVENOUS | Status: AC
  Filled 2016-05-09: qty 10

## 2016-05-09 MED ORDER — ONDANSETRON HCL 4 MG/2ML IJ SOLN: 4.0000 mg | Freq: Four times a day (QID) | INTRAMUSCULAR | Status: DC | PRN

## 2016-05-09 MED ORDER — SERTRALINE HCL 100 MG PO TABS
100.0000 mg | ORAL_TABLET | Freq: Every day | ORAL | Status: DC
  Administered 2016-05-10 – 2016-05-16 (×7): 100 mg via ORAL
  Filled 2016-05-09 (×7): qty 1

## 2016-05-09 MED ORDER — FENTANYL CITRATE (PF) 100 MCG/2ML IJ SOLN
INTRAMUSCULAR | Status: DC | PRN
  Administered 2016-05-09: 25 ug via INTRAVENOUS
  Administered 2016-05-09: 75 ug via INTRAVENOUS
  Administered 2016-05-09 (×2): 50 ug via INTRAVENOUS
  Administered 2016-05-09 (×2): 25 ug via INTRAVENOUS
  Administered 2016-05-09: 50 ug via INTRAVENOUS
  Administered 2016-05-09 (×2): 25 ug via INTRAVENOUS
  Administered 2016-05-09: 50 ug via INTRAVENOUS

## 2016-05-09 MED ORDER — OXYCODONE HCL 5 MG/5ML PO SOLN: 5.0000 mg | Freq: Once | ORAL | Status: DC | PRN

## 2016-05-09 MED ORDER — ONDANSETRON HCL 4 MG/2ML IJ SOLN: 4.0000 mg | Freq: Once | INTRAMUSCULAR | Status: DC | PRN

## 2016-05-09 MED ORDER — ROCURONIUM BROMIDE 100 MG/10ML IV SOLN
INTRAVENOUS | Status: DC | PRN
  Administered 2016-05-09 (×2): 10 mg via INTRAVENOUS
  Administered 2016-05-09: 50 mg via INTRAVENOUS
  Administered 2016-05-09: 20 mg via INTRAVENOUS
  Administered 2016-05-09 (×3): 10 mg via INTRAVENOUS

## 2016-05-09 MED ORDER — ONDANSETRON HCL 4 MG/2ML IJ SOLN
INTRAMUSCULAR | Status: AC
  Filled 2016-05-09: qty 2

## 2016-05-09 MED ORDER — OXYCODONE HCL 5 MG PO TABS
ORAL_TABLET | ORAL | Status: AC
  Administered 2016-05-09: 10 mg via ORAL
  Filled 2016-05-09: qty 2

## 2016-05-09 MED ORDER — MOMETASONE FURO-FORMOTEROL FUM 200-5 MCG/ACT IN AERO
2.0000 | INHALATION_SPRAY | Freq: Two times a day (BID) | RESPIRATORY_TRACT | Status: DC
  Administered 2016-05-10 – 2016-05-16 (×14): 2 via RESPIRATORY_TRACT
  Filled 2016-05-09: qty 8.8

## 2016-05-09 MED ORDER — FENTANYL CITRATE (PF) 100 MCG/2ML IJ SOLN: 25.0000 ug | INTRAMUSCULAR | Status: DC | PRN

## 2016-05-09 MED ORDER — PHENYLEPHRINE 40 MCG/ML (10ML) SYRINGE FOR IV PUSH (FOR BLOOD PRESSURE SUPPORT)
PREFILLED_SYRINGE | INTRAVENOUS | Status: DC | PRN
  Administered 2016-05-09: 40 ug via INTRAVENOUS

## 2016-05-09 MED ORDER — FENTANYL CITRATE (PF) 100 MCG/2ML IJ SOLN
25.0000 ug | INTRAMUSCULAR | Status: DC | PRN
  Administered 2016-05-09 (×2): 50 ug via INTRAVENOUS

## 2016-05-09 MED ORDER — FENTANYL CITRATE (PF) 250 MCG/5ML IJ SOLN
INTRAMUSCULAR | Status: AC
  Filled 2016-05-09: qty 5

## 2016-05-09 MED ORDER — BUPIVACAINE HCL (PF) 0.5 % IJ SOLN
INTRAMUSCULAR | Status: AC
  Filled 2016-05-09: qty 10

## 2016-05-09 MED ORDER — LIDOCAINE HCL (CARDIAC) 20 MG/ML IV SOLN
INTRAVENOUS | Status: DC | PRN
  Administered 2016-05-09: 80 mg via INTRAVENOUS

## 2016-05-09 MED ORDER — POTASSIUM CHLORIDE 10 MEQ/50ML IV SOLN
10.0000 meq | Freq: Every day | INTRAVENOUS | Status: DC | PRN
  Administered 2016-05-14 (×3): 10 meq via INTRAVENOUS
  Filled 2016-05-09 (×4): qty 50

## 2016-05-09 MED ORDER — BUPIVACAINE ON-Q PAIN PUMP (FOR ORDER SET NO CHG)
INJECTION | Status: AC
  Filled 2016-05-09: qty 1

## 2016-05-09 MED ORDER — ACETAMINOPHEN 10 MG/ML IV SOLN
INTRAVENOUS | Status: AC
  Filled 2016-05-09: qty 100

## 2016-05-09 MED ORDER — SODIUM CHLORIDE 0.9% FLUSH: 9.0000 mL | INTRAVENOUS | Status: DC | PRN

## 2016-05-09 MED ORDER — NALOXONE HCL 0.4 MG/ML IJ SOLN: 0.4000 mg | INTRAMUSCULAR | Status: DC | PRN

## 2016-05-09 MED ORDER — FENTANYL CITRATE (PF) 250 MCG/5ML IJ SOLN
INTRAMUSCULAR | Status: AC
Start: 2016-05-09 — End: 2016-05-09
  Filled 2016-05-09: qty 5

## 2016-05-09 MED ORDER — INSULIN ASPART 100 UNIT/ML ~~LOC~~ SOLN
0.0000 [IU] | SUBCUTANEOUS | Status: DC
  Administered 2016-05-09: 2 [IU] via SUBCUTANEOUS
  Administered 2016-05-09: 3 [IU] via SUBCUTANEOUS
  Administered 2016-05-10: 2 [IU] via SUBCUTANEOUS

## 2016-05-09 MED ORDER — FENTANYL 40 MCG/ML IV SOLN
INTRAVENOUS | Status: DC
  Administered 2016-05-09: 14:00:00 via INTRAVENOUS
  Administered 2016-05-09: 135 ug via INTRAVENOUS
  Administered 2016-05-10: 210 ug via INTRAVENOUS
  Administered 2016-05-10: 75 ug via INTRAVENOUS
  Administered 2016-05-10: 45 ug via INTRAVENOUS
  Administered 2016-05-10: 15 ug via INTRAVENOUS
  Administered 2016-05-10: 90 ug via INTRAVENOUS
  Administered 2016-05-10: 30 ug via INTRAVENOUS
  Administered 2016-05-11: 0 ug via INTRAVENOUS
  Administered 2016-05-11: 75 ug via INTRAVENOUS
  Administered 2016-05-11: 0 ug via INTRAVENOUS
  Administered 2016-05-11 (×2): 30 ug via INTRAVENOUS
  Administered 2016-05-11 (×2): 15 ug via INTRAVENOUS
  Administered 2016-05-12: 30 ug via INTRAVENOUS
  Administered 2016-05-12: 15 ug via INTRAVENOUS
  Administered 2016-05-12: 60 ug via INTRAVENOUS
  Administered 2016-05-13 (×3): 0 ug via INTRAVENOUS
  Administered 2016-05-13: 15 ug via INTRAVENOUS
  Administered 2016-05-13 – 2016-05-14 (×3): 0 ug via INTRAVENOUS
  Administered 2016-05-14: 30 ug via INTRAVENOUS
  Administered 2016-05-14 (×2): 0 ug via INTRAVENOUS
  Filled 2016-05-09: qty 25

## 2016-05-09 MED ORDER — ORAL CARE MOUTH RINSE
15.0000 mL | Freq: Two times a day (BID) | OROMUCOSAL | Status: DC
  Administered 2016-05-09 – 2016-05-14 (×10): 15 mL via OROMUCOSAL

## 2016-05-09 MED ORDER — SENNOSIDES-DOCUSATE SODIUM 8.6-50 MG PO TABS
1.0000 | ORAL_TABLET | Freq: Every day | ORAL | Status: DC
  Administered 2016-05-09 – 2016-05-13 (×5): 1 via ORAL
  Filled 2016-05-09 (×6): qty 1

## 2016-05-09 MED ORDER — SUGAMMADEX SODIUM 200 MG/2ML IV SOLN
INTRAVENOUS | Status: DC | PRN
  Administered 2016-05-09: 200 mg via INTRAVENOUS

## 2016-05-09 MED ORDER — ACETAMINOPHEN 160 MG/5ML PO SOLN: 1000.0000 mg | Freq: Four times a day (QID) | ORAL | Status: AC

## 2016-05-09 MED ORDER — MEPERIDINE HCL 25 MG/ML IJ SOLN: 6.2500 mg | INTRAMUSCULAR | Status: DC | PRN

## 2016-05-09 MED ORDER — PROPOFOL 10 MG/ML IV BOLUS
INTRAVENOUS | Status: DC | PRN
  Administered 2016-05-09: 130 mg via INTRAVENOUS

## 2016-05-09 MED ORDER — DEXTROSE 5 % IV SOLN
1.5000 g | Freq: Two times a day (BID) | INTRAVENOUS | Status: AC
  Administered 2016-05-09 – 2016-05-10 (×2): 1.5 g via INTRAVENOUS
  Filled 2016-05-09 (×3): qty 1.5

## 2016-05-09 MED ORDER — SODIUM CHLORIDE 0.9 % IV SOLN: Freq: Once | INTRAVENOUS | Status: DC

## 2016-05-09 MED ORDER — GABAPENTIN 100 MG PO CAPS
200.0000 mg | ORAL_CAPSULE | ORAL | Status: DC
  Administered 2016-05-10 – 2016-05-13 (×4): 200 mg via ORAL
  Filled 2016-05-09 (×4): qty 2

## 2016-05-09 MED ORDER — FENTANYL CITRATE (PF) 100 MCG/2ML IJ SOLN
INTRAMUSCULAR | Status: AC
  Administered 2016-05-09: 50 ug via INTRAVENOUS
  Filled 2016-05-09: qty 2

## 2016-05-09 MED ORDER — ONDANSETRON HCL 4 MG/2ML IJ SOLN
INTRAMUSCULAR | Status: DC | PRN
Start: 2016-05-09 — End: 2016-05-09
  Administered 2016-05-09: 4 mg via INTRAVENOUS

## 2016-05-09 MED ORDER — BUPIVACAINE 0.5 % ON-Q PUMP SINGLE CATH 400 ML
INJECTION | Status: DC | PRN
  Administered 2016-05-09: 400 mL

## 2016-05-09 MED ORDER — MIDAZOLAM HCL 5 MG/5ML IJ SOLN
INTRAMUSCULAR | Status: DC | PRN
  Administered 2016-05-09: 2 mg via INTRAVENOUS

## 2016-05-09 MED ORDER — ALBUMIN HUMAN 5 % IV SOLN
INTRAVENOUS | Status: DC | PRN
  Administered 2016-05-09: 12:00:00 via INTRAVENOUS

## 2016-05-09 MED ORDER — HYDROMORPHONE HCL 1 MG/ML IJ SOLN: 0.2500 mg | INTRAMUSCULAR | Status: DC | PRN

## 2016-05-09 MED ORDER — ACETAMINOPHEN 10 MG/ML IV SOLN
1000.0000 mg | Freq: Once | INTRAVENOUS | Status: AC
  Administered 2016-05-09: 1000 mg via INTRAVENOUS

## 2016-05-09 MED ORDER — PROPOFOL 10 MG/ML IV BOLUS
INTRAVENOUS | Status: AC
  Filled 2016-05-09: qty 20

## 2016-05-09 MED ORDER — DIPHENHYDRAMINE HCL 12.5 MG/5ML PO ELIX: 12.5000 mg | ORAL_SOLUTION | Freq: Four times a day (QID) | ORAL | Status: DC | PRN

## 2016-05-09 MED ORDER — TIOTROPIUM BROMIDE MONOHYDRATE 18 MCG IN CAPS
18.0000 ug | ORAL_CAPSULE | Freq: Every day | RESPIRATORY_TRACT | Status: DC
  Administered 2016-05-10 – 2016-05-16 (×7): 18 ug via RESPIRATORY_TRACT
  Filled 2016-05-09 (×3): qty 5

## 2016-05-09 MED ORDER — PHENYLEPHRINE HCL 10 MG/ML IJ SOLN
INTRAVENOUS | Status: DC | PRN
  Administered 2016-05-09: 40 ug/min via INTRAVENOUS

## 2016-05-09 MED ORDER — ACETAMINOPHEN 500 MG PO TABS
1000.0000 mg | ORAL_TABLET | Freq: Four times a day (QID) | ORAL | Status: AC
  Administered 2016-05-09 – 2016-05-14 (×17): 1000 mg via ORAL
  Filled 2016-05-09 (×18): qty 2

## 2016-05-09 MED ORDER — DIPHENHYDRAMINE HCL 50 MG/ML IJ SOLN: 12.5000 mg | Freq: Four times a day (QID) | INTRAMUSCULAR | Status: DC | PRN

## 2016-05-09 MED ORDER — OXYCODONE HCL 5 MG PO TABS
5.0000 mg | ORAL_TABLET | ORAL | Status: DC | PRN
  Administered 2016-05-09 – 2016-05-16 (×16): 10 mg via ORAL
  Filled 2016-05-09 (×15): qty 2

## 2016-05-09 MED ORDER — LACTATED RINGERS IV SOLN
INTRAVENOUS | Status: DC | PRN
  Administered 2016-05-09 (×2): via INTRAVENOUS

## 2016-05-09 MED ORDER — LACTATED RINGERS IV SOLN
INTRAVENOUS | Status: DC | PRN
  Administered 2016-05-09: 07:00:00 via INTRAVENOUS

## 2016-05-09 MED ORDER — TRAMADOL HCL 50 MG PO TABS
50.0000 mg | ORAL_TABLET | Freq: Four times a day (QID) | ORAL | Status: DC | PRN
  Administered 2016-05-09 – 2016-05-10 (×2): 100 mg via ORAL
  Filled 2016-05-09 (×2): qty 2

## 2016-05-09 MED ORDER — BUPIVACAINE 0.5 % ON-Q PUMP SINGLE CATH 400 ML
400.0000 mL | INJECTION | Status: DC
Start: 2016-05-09 — End: 2016-05-09
  Filled 2016-05-09: qty 400

## 2016-05-09 MED ORDER — DEXTROSE-NACL 5-0.45 % IV SOLN
INTRAVENOUS | Status: DC
  Administered 2016-05-09 – 2016-05-12 (×4): via INTRAVENOUS

## 2016-05-09 MED ORDER — ALBUTEROL SULFATE (2.5 MG/3ML) 0.083% IN NEBU: 2.5000 mg | INHALATION_SOLUTION | RESPIRATORY_TRACT | Status: DC | PRN

## 2016-05-09 MED ORDER — LABETALOL HCL 5 MG/ML IV SOLN
INTRAVENOUS | Status: DC | PRN
  Administered 2016-05-09: 5 mg via INTRAVENOUS
  Administered 2016-05-09 (×3): 2.5 mg via INTRAVENOUS

## 2016-05-09 MED ORDER — BISACODYL 5 MG PO TBEC
10.0000 mg | DELAYED_RELEASE_TABLET | Freq: Every day | ORAL | Status: DC
  Administered 2016-05-09 – 2016-05-12 (×4): 10 mg via ORAL
  Filled 2016-05-09 (×6): qty 2

## 2016-05-09 MED ORDER — OXYCODONE HCL 5 MG PO TABS: 5.0000 mg | ORAL_TABLET | Freq: Once | ORAL | Status: DC | PRN

## 2016-05-09 MED ORDER — HEMOSTATIC AGENTS (NO CHARGE) OPTIME
TOPICAL | Status: DC | PRN
  Administered 2016-05-09: 1 via TOPICAL

## 2016-05-09 MED ORDER — METOCLOPRAMIDE HCL 5 MG/ML IJ SOLN
10.0000 mg | Freq: Four times a day (QID) | INTRAMUSCULAR | Status: AC
  Administered 2016-05-10 – 2016-05-14 (×16): 10 mg via INTRAVENOUS
  Filled 2016-05-09 (×17): qty 2

## 2016-05-09 SURGICAL SUPPLY — 113 items
ADH SKN CLS APL DERMABOND .7 (GAUZE/BANDAGES/DRESSINGS)
APL SKNCLS STERI-STRIP NONHPOA (GAUZE/BANDAGES/DRESSINGS) ×2
APPLIER CLIP ROT 10 11.4 M/L (STAPLE) ×4
APR CLP MED LRG 11.4X10 (STAPLE) ×2
BAG SPEC RTRVL LRG 6X4 10 (ENDOMECHANICALS) ×2
BENZOIN TINCTURE PRP APPL 2/3 (GAUZE/BANDAGES/DRESSINGS) ×4 IMPLANT
CANISTER SUCTION 2500CC (MISCELLANEOUS) ×8 IMPLANT
CATH KIT ON Q 5IN SLV (PAIN MANAGEMENT) ×2 IMPLANT
CATH THORACIC 28FR (CATHETERS) IMPLANT
CATH THORACIC 36FR (CATHETERS) IMPLANT
CATH THORACIC 36FR RT ANG (CATHETERS) IMPLANT
CLIP APPLIE ROT 10 11.4 M/L (STAPLE) IMPLANT
CLIP TI MEDIUM 24 (CLIP) ×4 IMPLANT
CLIP TI MEDIUM 6 (CLIP) ×4 IMPLANT
CONN ST 1/4X3/8  BEN (MISCELLANEOUS) ×2
CONN ST 1/4X3/8 BEN (MISCELLANEOUS) IMPLANT
CONN Y 3/8X3/8X3/8  BEN (MISCELLANEOUS) ×2
CONN Y 3/8X3/8X3/8 BEN (MISCELLANEOUS) IMPLANT
CONT SPEC 4OZ CLIKSEAL STRL BL (MISCELLANEOUS) ×22 IMPLANT
COVER SURGICAL LIGHT HANDLE (MISCELLANEOUS) ×4 IMPLANT
DERMABOND ADVANCED (GAUZE/BANDAGES/DRESSINGS)
DERMABOND ADVANCED .7 DNX12 (GAUZE/BANDAGES/DRESSINGS) IMPLANT
DRAIN CHANNEL 28F RND 3/8 FF (WOUND CARE) IMPLANT
DRAIN CHANNEL 32F RND 10.7 FF (WOUND CARE) IMPLANT
DRAPE LAPAROSCOPIC ABDOMINAL (DRAPES) ×4 IMPLANT
DRAPE WARM FLUID 44X44 (DRAPE) ×4 IMPLANT
ELECT BLADE 6.5 EXT (BLADE) ×4 IMPLANT
ELECT REM PT RETURN 9FT ADLT (ELECTROSURGICAL) ×4
ELECTRODE REM PT RTRN 9FT ADLT (ELECTROSURGICAL) ×2 IMPLANT
FELT TEFLON 1X6 (MISCELLANEOUS) ×2 IMPLANT
GAUZE SPONGE 4X4 12PLY STRL (GAUZE/BANDAGES/DRESSINGS) ×4 IMPLANT
GLOVE BIOGEL PI IND STRL 7.0 (GLOVE) IMPLANT
GLOVE BIOGEL PI IND STRL 7.5 (GLOVE) IMPLANT
GLOVE BIOGEL PI INDICATOR 7.0 (GLOVE) ×2
GLOVE BIOGEL PI INDICATOR 7.5 (GLOVE) ×2
GLOVE SURG SIGNA 7.5 PF LTX (GLOVE) ×8 IMPLANT
GLOVE SURG SS PI 6.0 STRL IVOR (GLOVE) ×2 IMPLANT
GOWN STRL REUS W/ TWL LRG LVL3 (GOWN DISPOSABLE) ×4 IMPLANT
GOWN STRL REUS W/ TWL XL LVL3 (GOWN DISPOSABLE) ×4 IMPLANT
GOWN STRL REUS W/TWL LRG LVL3 (GOWN DISPOSABLE) ×8
GOWN STRL REUS W/TWL XL LVL3 (GOWN DISPOSABLE) ×8
HANDLE STAPLE ENDO GIA SHORT (STAPLE)
HEMOSTAT SURGICEL 2X14 (HEMOSTASIS) IMPLANT
KIT BASIN OR (CUSTOM PROCEDURE TRAY) ×4 IMPLANT
KIT ROOM TURNOVER OR (KITS) ×4 IMPLANT
KIT SUCTION CATH 14FR (SUCTIONS) ×4 IMPLANT
NS IRRIG 1000ML POUR BTL (IV SOLUTION) ×12 IMPLANT
PACK CHEST (CUSTOM PROCEDURE TRAY) ×4 IMPLANT
PAD ARMBOARD 7.5X6 YLW CONV (MISCELLANEOUS) ×8 IMPLANT
POUCH ENDO CATCH II 15MM (MISCELLANEOUS) ×2 IMPLANT
POUCH SPECIMEN RETRIEVAL 10MM (ENDOMECHANICALS) ×2 IMPLANT
RELOAD 45 THICK GREEN (ENDOMECHANICALS) ×4 IMPLANT
RELOAD STAPLE 35X2.5 WHT THIN (STAPLE) IMPLANT
RELOAD STAPLE 45 GRN THCK ETS (ENDOMECHANICALS) IMPLANT
RELOAD STAPLE 60 3.8 GOLD REG (STAPLE) IMPLANT
RELOAD STAPLE 60 4.1 GRN THCK (STAPLE) IMPLANT
RELOAD STAPLER GOLD 60MM (STAPLE) ×26 IMPLANT
RELOAD STAPLER GREEN 60MM (STAPLE) ×4 IMPLANT
SCISSORS ENDO CVD 5DCS (MISCELLANEOUS) IMPLANT
SEALANT PROGEL (MISCELLANEOUS) IMPLANT
SEALANT SURG COSEAL 4ML (VASCULAR PRODUCTS) IMPLANT
SEALANT SURG COSEAL 8ML (VASCULAR PRODUCTS) IMPLANT
SHEARS HARMONIC HDI 36CM (ELECTROSURGICAL) ×2 IMPLANT
SOLUTION ANTI FOG 6CC (MISCELLANEOUS) ×6 IMPLANT
SPECIMEN JAR MEDIUM (MISCELLANEOUS) ×4 IMPLANT
SPONGE GAUZE 4X4 12PLY STER LF (GAUZE/BANDAGES/DRESSINGS) ×2 IMPLANT
SPONGE INTESTINAL PEANUT (DISPOSABLE) ×12 IMPLANT
SPONGE TONSIL 1 RF SGL (DISPOSABLE) ×6 IMPLANT
STAPLE ECHEON FLEX 60 POW ENDO (STAPLE) ×2 IMPLANT
STAPLE RELOAD 2.5MM WHITE (STAPLE) ×16 IMPLANT
STAPLER ENDO GIA 12 SHRT THIN (STAPLE) IMPLANT
STAPLER ENDO GIA 12MM SHORT (STAPLE) IMPLANT
STAPLER ENDO NO KNIFE (STAPLE) ×2 IMPLANT
STAPLER RELOAD GOLD 60MM (STAPLE) ×52
STAPLER RELOAD GREEN 60MM (STAPLE) ×8
STAPLER VASCULAR ECHELON 35 (CUTTER) ×2 IMPLANT
SUT ETHILON 3 0 FSL (SUTURE) ×2 IMPLANT
SUT PROLENE 4 0 RB 1 (SUTURE) ×4
SUT PROLENE 4-0 RB1 .5 CRCL 36 (SUTURE) IMPLANT
SUT PROLENE 5 0 C 1 36 (SUTURE) ×2 IMPLANT
SUT SILK  1 MH (SUTURE) ×4
SUT SILK 1 MH (SUTURE) ×2 IMPLANT
SUT SILK 1 TIES 10X30 (SUTURE) ×4 IMPLANT
SUT SILK 2 0 SH (SUTURE) IMPLANT
SUT SILK 2 0SH CR/8 30 (SUTURE) ×2 IMPLANT
SUT SILK 3 0 SH 30 (SUTURE) IMPLANT
SUT SILK 3 0SH CR/8 30 (SUTURE) ×2 IMPLANT
SUT VIC AB 0 CTX 27 (SUTURE) IMPLANT
SUT VIC AB 1 CTX 27 (SUTURE) ×4 IMPLANT
SUT VIC AB 2-0 CT1 27 (SUTURE) ×4
SUT VIC AB 2-0 CT1 TAPERPNT 27 (SUTURE) IMPLANT
SUT VIC AB 2-0 CTX 36 (SUTURE) ×4 IMPLANT
SUT VIC AB 3-0 MH 27 (SUTURE) ×2 IMPLANT
SUT VIC AB 3-0 SH 27 (SUTURE)
SUT VIC AB 3-0 SH 27X BRD (SUTURE) IMPLANT
SUT VIC AB 3-0 X1 27 (SUTURE) ×4 IMPLANT
SUT VICRYL 0 UR6 27IN ABS (SUTURE) ×8 IMPLANT
SUT VICRYL 2 TP 1 (SUTURE) IMPLANT
SWAB COLLECTION DEVICE MRSA (MISCELLANEOUS) IMPLANT
SYRINGE 10CC LL (SYRINGE) ×4 IMPLANT
SYSTEM SAHARA CHEST DRAIN ATS (WOUND CARE) ×4 IMPLANT
TAPE CLOTH SURG 4X10 WHT LF (GAUZE/BANDAGES/DRESSINGS) ×2 IMPLANT
TAPE STRIPS DRAPE STRL (GAUZE/BANDAGES/DRESSINGS) ×2 IMPLANT
TIP APPLICATOR SPRAY EXTEND 16 (VASCULAR PRODUCTS) IMPLANT
TOWEL OR 17X24 6PK STRL BLUE (TOWEL DISPOSABLE) ×4 IMPLANT
TOWEL OR 17X26 10 PK STRL BLUE (TOWEL DISPOSABLE) ×8 IMPLANT
TRAP SPECIMEN MUCOUS 40CC (MISCELLANEOUS) IMPLANT
TRAY FOLEY CATH 16FRSI W/METER (SET/KITS/TRAYS/PACK) ×4 IMPLANT
TROCAR XCEL BLADELESS 5X75MML (TROCAR) ×4 IMPLANT
TROCAR XCEL NON-BLD 5MMX100MML (ENDOMECHANICALS) IMPLANT
TUBE ANAEROBIC SPECIMEN COL (MISCELLANEOUS) IMPLANT
TUNNELER SHEATH ON-Q 11GX8 DSP (PAIN MANAGEMENT) ×2 IMPLANT
WATER STERILE IRR 1000ML POUR (IV SOLUTION) ×4 IMPLANT

## 2016-05-09 NOTE — Brief Op Note (Addendum)
05/09/2016  12:53 PM  PATIENT:  [name]  75 y.o. female  PRE-OPERATIVE DIAGNOSIS:  RUL NODULE  POST-OPERATIVE DIAGNOSIS:  RUL ADENOCARCINOMA- Clinical stage IA  PROCEDURE:  Procedure(s): RIGHT VIDEO ASSISTED THORACOSCOPY (VATS) WEDGE RESECTION (Right) THORACOSCOPIC RIGHT UPPER LOBECTOMY ON=Q LOCAL ANESTHETIC CATHETER PLACEMENT  SURGEON:  Surgeon(s) and Role:    * [name], MD - Primary  PHYSICIAN ASSISTANT:  Nicholes Rough, PA-C  ANESTHESIA:   general  EBL:  Total I/O In: 2050 [I.V.:1800; IV Piggyback:250] Out: 1500 [Urine:700; Blood:800]  BLOOD ADMINISTERED:none  DRAINS: (1) Blake drain(s) in the right pleural space, 1 regular chest tube in the right pleural space  LOCAL MEDICATIONS USED:  NONE  SPECIMEN:  Source of Specimen:  right upper lobe, nodes  DISPOSITION OF SPECIMEN:  PATHOLOGY  COUNTS:  YES  PLAN OF CARE: Admit to inpatient   PATIENT DISPOSITION:  PACU - hemodynamically stable.   FROZEN- adenocarcinoma, bronchial margin negative for tumor

## 2016-05-09 NOTE — Transfer of Care (Signed)
Immediate Anesthesia Transfer of Care Note  Patient: Royetta Crochet Fuson  Procedure(s) Performed: Procedure(s): VIDEO ASSISTED THORACOSCOPY (VATS)/WEDGE RESECTION (Right) RIGHT UPPER LOBECTOMY (Right)  Patient Location: PACU  Anesthesia Type:General  Level of Consciousness: awake, alert  and patient cooperative  Airway & Oxygen Therapy: Patient Spontanous Breathing and Patient connected to nasal cannula oxygen  Post-op Assessment: Report given to RN, Post -op Vital signs reviewed and stable and Patient moving all extremities X 4  Post vital signs: Reviewed and stable  Last Vitals:  Vitals:   05/09/16 0606 05/09/16 1318  BP: (!) 162/80 (P) 98/63  Pulse: 87   Resp: 16   Temp: 36.8 C 36.3 C    Last Pain:  Vitals:   05/09/16 0606  TempSrc: Oral      Patients Stated Pain Goal: 3 (69/45/03 8882)  Complications: No apparent anesthesia complications

## 2016-05-09 NOTE — Anesthesia Procedure Notes (Signed)
Procedure Name: Intubation Date/Time: 05/09/2016 7:49 AM Performed by: Carney Living Pre-anesthesia Checklist: Patient identified, Emergency Drugs available, Suction available, Patient being monitored and Timeout performed Patient Re-evaluated:Patient Re-evaluated prior to inductionOxygen Delivery Method: Circle system utilized Preoxygenation: Pre-oxygenation with 100% oxygen Intubation Type: IV induction Ventilation: Mask ventilation without difficulty Laryngoscope Size: Mac and 4 Grade View: Grade I Endobronchial tube: Left and Double lumen EBT and 37 Fr Number of attempts: 1 Airway Equipment and Method: Stylet and Fiberoptic brochoscope Placement Confirmation: ETT inserted through vocal cords under direct vision,  positive ETCO2 and breath sounds checked- equal and bilateral Secured at: 27 cm Tube secured with: Tape Dental Injury: Teeth and Oropharynx as per pre-operative assessment

## 2016-05-09 NOTE — Anesthesia Procedure Notes (Signed)
Central Venous Catheter Insertion Performed by: anesthesiologist Patient location: Pre-op. Preanesthetic checklist: patient identified, IV checked, site marked, risks and benefits discussed, surgical consent, monitors and equipment checked, pre-op evaluation, timeout performed and anesthesia consent Position: Trendelenburg Lidocaine 1% used for infiltration Landmarks identified Catheter size: 8 Fr Central line was placed.Double lumen Procedure performed using ultrasound guided technique. Attempts: 1 Following insertion, dressing applied, line sutured and Biopatch. Post procedure assessment: blood return through all ports, free fluid flow and no air. Patient tolerated the procedure well with no immediate complications.

## 2016-05-09 NOTE — Interval H&P Note (Signed)
History and Physical Interval Note:  05/09/2016 7:21 AM  [name]  has presented today for surgery, with the diagnosis of RUL NODULE  The various methods of treatment have been discussed with the patient and family. After consideration of risks, benefits and other options for treatment, the patient has consented to  Procedure(s): VIDEO ASSISTED THORACOSCOPY (VATS)/WEDGE RESECTION (Right) POSSIBLE RIGHT UPPER LOBECTOMY (Right) as a surgical intervention .  The patient's history has been reviewed, patient examined, no change in status, stable for surgery.  I have reviewed the patient's chart and labs.  Questions were answered to the patient's satisfaction.     [name]
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
## 2016-05-10 ENCOUNTER — Inpatient Hospital Stay (HOSPITAL_COMMUNITY): Payer: Commercial Managed Care - HMO

## 2016-05-10 ENCOUNTER — Encounter (HOSPITAL_COMMUNITY): Payer: Self-pay | Admitting: Thoracic Surgery (Cardiothoracic Vascular Surgery)

## 2016-05-10 LAB — BASIC METABOLIC PANEL
Anion gap: 7 (ref 5–15)
BUN: 13 mg/dL (ref 6–20)
CO2: 26 mmol/L (ref 22–32)
Calcium: 7.6 mg/dL — ABNORMAL LOW (ref 8.9–10.3)
Chloride: 105 mmol/L (ref 101–111)
Creatinine, Ser: 1.62 mg/dL — ABNORMAL HIGH (ref 0.44–1.00)
GFR calc Af Amer: 35 mL/min — ABNORMAL LOW (ref >60)
GFR calc non Af Amer: 30 mL/min — ABNORMAL LOW (ref >60)
Glucose, Bld: 119 mg/dL — ABNORMAL HIGH (ref 65–99)
Potassium: 4 mmol/L (ref 3.5–5.1)
Sodium: 138 mmol/L (ref 135–145)

## 2016-05-10 LAB — GLUCOSE, CAPILLARY
Glucose-Capillary: 126 mg/dL — ABNORMAL HIGH (ref 65–99)
Glucose-Capillary: 117 mg/dL — ABNORMAL HIGH (ref 65–99)
Glucose-Capillary: 107 mg/dL — ABNORMAL HIGH (ref 65–99)
Glucose-Capillary: 111 mg/dL — ABNORMAL HIGH (ref 65–99)
Glucose-Capillary: 101 mg/dL — ABNORMAL HIGH (ref 65–99)
Glucose-Capillary: 98 mg/dL (ref 65–99)

## 2016-05-10 LAB — CBC
HCT: 29.2 % — ABNORMAL LOW (ref 36.0–46.0)
Hemoglobin: 9.5 g/dL — ABNORMAL LOW (ref 12.0–15.0)
MCH: 29.5 pg (ref 26.0–34.0)
MCHC: 32.5 g/dL (ref 30.0–36.0)
MCV: 90.7 fL (ref 78.0–100.0)
Platelets: 228 10*3/uL (ref 150–400)
RBC: 3.22 MIL/uL — ABNORMAL LOW (ref 3.87–5.11)
RDW: 14.1 % (ref 11.5–15.5)
WBC: 13.4 10*3/uL — ABNORMAL HIGH (ref 4.0–10.5)

## 2016-05-10 IMAGING — CR DG CHEST 1V PORT
1 series · 1 of 1 positions shown · non-contrast
Comparison: Portable chest x-ray of [DATE], PET-CT of
[DATE]

CLINICAL DATA: Chest tube, followup after right upper lobectomy

EXAM:
PORTABLE CHEST 1 VIEW

[AP]
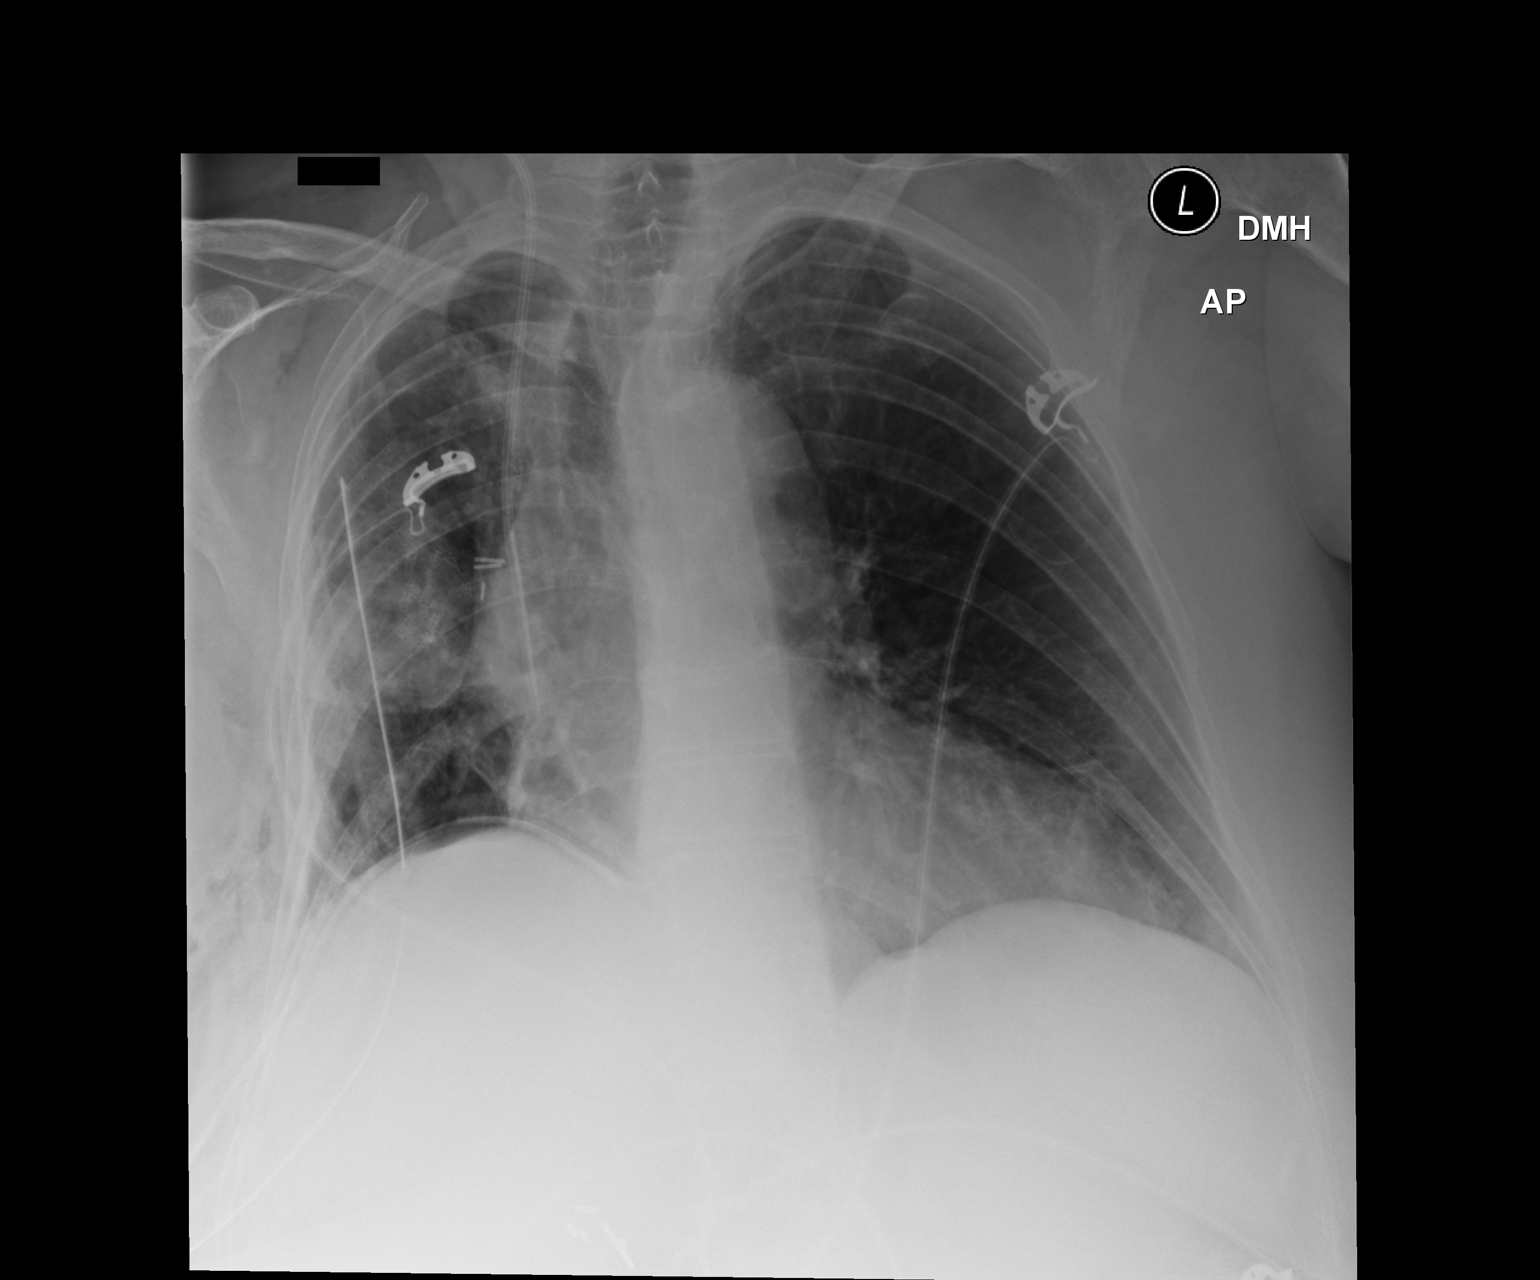

[1 of 1 positions shown; findings below may reference images not displayed]

FINDINGS: A right chest tube remains and no pneumothorax is seen. Volume loss
on the right is unchanged after right upper lobectomy with vague
opacity in the right mid and upper lung most consistent with
atelectasis and postoperative change. Right IJ central venous line
tip overlies the lower SVC. The left lung is clear. Heart size is
stable.
IMPRESSION: 1. Postoperative opacities in the right lung with right chest tube
remaining. No pneumothorax.
2. Right IJ central venous line tip overlies the lower SVC .

## 2016-05-10 MED ORDER — RALOXIFENE HCL 60 MG PO TABS
60.0000 mg | ORAL_TABLET | Freq: Every day | ORAL | Status: DC
  Administered 2016-05-10 – 2016-05-16 (×7): 60 mg via ORAL
  Filled 2016-05-10 (×7): qty 1

## 2016-05-10 MED ORDER — PANTOPRAZOLE SODIUM 40 MG PO TBEC
40.0000 mg | DELAYED_RELEASE_TABLET | Freq: Every day | ORAL | Status: DC
  Administered 2016-05-10 – 2016-05-16 (×7): 40 mg via ORAL
  Filled 2016-05-10 (×7): qty 1

## 2016-05-10 MED ORDER — DEXTROSE 5 % AND 0.9 % NACL IV BOLUS
500.0000 mL | Freq: Once | INTRAVENOUS | Status: AC
  Administered 2016-05-10: 500 mL via INTRAVENOUS

## 2016-05-10 MED ORDER — TRAMADOL HCL 50 MG PO TABS
50.0000 mg | ORAL_TABLET | Freq: Four times a day (QID) | ORAL | Status: DC | PRN
  Administered 2016-05-10: 50 mg via ORAL
  Filled 2016-05-10: qty 1

## 2016-05-10 MED ORDER — ATORVASTATIN CALCIUM 20 MG PO TABS
20.0000 mg | ORAL_TABLET | Freq: Every day | ORAL | Status: DC
  Administered 2016-05-10 – 2016-05-15 (×5): 20 mg via ORAL
  Filled 2016-05-10 (×7): qty 1

## 2016-05-10 MED ORDER — WHITE PETROLATUM GEL
Status: AC
  Administered 2016-05-10: 15:00:00
  Filled 2016-05-10: qty 1

## 2016-05-10 MED ORDER — ENOXAPARIN SODIUM 30 MG/0.3ML ~~LOC~~ SOLN
30.0000 mg | SUBCUTANEOUS | Status: DC
  Administered 2016-05-10 – 2016-05-16 (×7): 30 mg via SUBCUTANEOUS
  Filled 2016-05-10 (×7): qty 0.3

## 2016-05-10 NOTE — Op Note (Signed)
[name], [name]               ACCOUNT NO.:  [id number]  MEDICAL RECORD NO.:  [id number]  LOCATION:  3S09C                        FACILITY:  [facility]  PHYSICIAN:  Revonda Standard. Roxan Hockey, M.D.DATE OF BIRTH:  24-Nov-1940  DATE OF PROCEDURE:  05/09/2016 DATE OF DISCHARGE:                              OPERATIVE REPORT   PREOPERATIVE DIAGNOSIS:  Right upper lobe nodule.  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  PROCEDURE:   Right video-assisted thoracoscopy, Wedge resection right upper lobe nodule, Thoracoscopic right upper lobectomy,  On-Q local anesthetic catheter placement.  SURGEON:  Revonda Standard. Roxan Hockey, M.D.  ASSISTANT:  Nicholes Rough, PA  ANESTHESIA:  General.  FINDINGS:  Nodule in lateral aspect of right upper lobe.  Frozen section revealed adenocarcinoma.  Unusual anatomy of posterior ascending pulmonary artery branch with 2 branches in close proximity.  Bronchial margin negative for tumor.  CLINICAL NOTE:  [name] is a 75 year old woman with a history of heavy tobacco abuse and COPD.  She recently was advised to undergo a low- dose CT screening due to her history of tobacco abuse.  The CT showed a right upper lobe nodule.  On PET-CT, this was hypermetabolic and highly suspicious for a new primary bronchogenic carcinoma.  There was no evidence of regional or distant metastases.  The patient was advised to undergo right video-assisted thoracoscopy, wedge resection and possible right upper lobectomy.  The indications, risks, benefits, and alternatives were discussed in detail with the patient.  She understood and accepted the risks and agreed to proceed.  OPERATIVE NOTE:  [name] was brought to the preoperative holding area on May 09, 2016.  Anesthesia placed a central line and an arterial blood pressure monitoring line.  She was taken to the operating room, anesthetized, and intubated.  Foley catheter was placed.   Sequential compression devices were placed on the calves for DVT prophylaxis. Intravenous antibiotics were administered.  She was placed in a left lateral decubitus position and the right chest was prepped and draped in the usual sterile fashion.  Single lung ventilation of the left lung was initiated and was tolerated well throughout the procedure.  After performing a time-out, an incision was made in the right chest in the anterior axillary line in the seventh intercostal space.  A 5 mm port was inserted into the chest and the thoracoscope was advanced into the chest.  There was good isolation of the right lung, but it was initially slow to deflate.  With suction applied through the bronchial port, there was deflation of the right lung and the patient tolerated this well throughout the procedure.  The lung was inspected.  The minor fissure was incomplete.  The major fissure was relatively complete anteriorly, but incomplete posteriorly.  The inferior pulmonary ligament was divided with electrocautery.  A lymph node was removed.  All lymph nodes that were encountered during the dissection were removed and sent for permanent pathology.  The pleural reflection then was divided at the hilum anteriorly and posteriorly.  Attention was turned to the right upper lobe and there was a palpable nodule in the lateral aspect of the right upper lobe.  There appeared to be  possibly some invagination of the visceral pleura, but this was not definitely invasion.  A wedge resection was performed with sequential firings of an endoscopic stapler.  The Echelon stapler 60 mm with gold cartridges was used.  The specimen was placed into an endoscopic retrieval bag and removed.  It was sent for frozen section, which subsequently returned showing adenocarcinoma.  The right middle lobe vein was identified and the upper lobe vein was also identified.  The upper lobe vein was carefully dissected out.  It was  difficult to get a good angle on the main upper lobe vein trunk, so the branches were taken separately, which provided a better angle.  The branches were divided with an endoscopic vascular stapler.  After dividing the right upper lobe vein branches, the dissection was carried more superiorly. Overlying tissue was divided and the anterior and apical pulmonary arterial branches were identified.  The anterior branch was divided first with the endoscopic vascular stapler and then the apical branch was divided.  The dissection was then carried back into the fissure.  A portion of the minor fissure was completed with sequential firings of the 60 mm stapler with gold cartridges.  The dissection was very difficult in the confluence of the fissures.  The basilar and superior segmental branches to the lower lobe were identified.  There were multiple nodes in this area that were removed.  There was a lot of bleeding with that, which was ultimately controlled with electrocautery.  There was a large node overlying the posterior ascending branch and attempting to remove this lymph node, there was bleeding from the posterior ascending branch at its takeoff from the pulmonary artery.  Pressure was applied.  Surgicel was placed on the area and then a Kittner dissector was placed over that to hold pressure.  After holding pressure there for 15 minutes, the Kittner dissector was removed, but while exploring the area again, bleeding resumed.  Pressure was again held.  The dissection was then carried back anteriorly.  There were noted to be 2 posterior ascending branches in close proximity.  The smaller one was divided with the Harmonic scalpel and the stump was later clipped as well.  The larger one was the one that was causing the bleeding.  Two clips were placed on this vessel proximally.  It was then divided with the Harmonic scalpel.  There was still bleeding from the parenchyma which was controlled with  clips.  The minor fissure then was completed with additional firings of the endoscopic stapler, this time using green cartridges.  Nodes around the bronchus were taken with the specimen en bloc.  The endoscopic stapler with a green cartridge was placed across the right upper lobe bronchus and closed.  A test inflation showed good aeration of the lower and middle lobes.  The stapler was fired transecting the right upper lobe bronchus.  This specimen was placed into an endoscopic retrieval bag and removed and sent for frozen section of the bronchial margin which subsequently returned negative for tumor.  The chest was copiously irrigated with warm saline.  The orientation of the right middle lobe was confirmed and the right middle lobe was tacked to the right lower lobe with a no-knife stapler and a 3-0 Vicryl figure-of-eight suture to avoid torsion.  The chest was irrigated with warm saline.  A test inflation to 30 cm of water revealed no leakage from the bronchial stump.  There was some parenchymal air leak from the lower and middle lobes.  All of the pulmonary artery branches were inspected and there was good hemostasis.  There also was good hemostasis at the pulmonary vein staple lines.  Because of the location of the clips, they were at risk for being dislodged with manipulation.  The decision was made not to perform a formal lymph node dissection in order to avoid that possibility.  A 28-French Blake drain was placed via the original port incision and directed posteriorly.  A 28-French chest tube was placed through a separate port incision and directed anteriorly.  They were secured to the skin with #1 silk sutures.  The lower and middle lobes were reinflated.  There was good aeration of both.  The working incision was closed with a #1 Vicryl fascial suture followed by 2-0 Vicryl subcutaneous suture and a 3-0 Vicryl subcuticular suture.  All sponge, needle, and instrument counts were  correct at the end of the procedure.  There were no intraoperative complications.  The patient was taken from the operating room to the postanesthetic care unit in good condition.     Revonda Standard Roxan Hockey, M.D.     SCH/MEDQ  D:  05/09/2016  T:  05/10/2016  Job:  [id number]

## 2016-05-10 NOTE — Care Management Note (Signed)
Case Management Note  Patient Details  Name: FRANCYNE ARREAGA MRN: [id number] Date of Birth: 1940-11-22  Subjective/Objective:    Patient is s/p vats, lobectomy, has chest tube x 2, pca pump, anxious, she lives alone, pta was indep, has a pcp, has medication coverage and transportation at discharge.  Per pt eval rec hhpt, patient daughter chose [facility], NCM will need to make referral to [facility] on 10/18.  Patient 's family member states she has a rolling walker at home that patient can use.  NCM will cont to follow for dc needs.                 Action/Plan:   Expected Discharge Date:                  Expected Discharge Plan:  [place]  In-House Referral:     Discharge planning Services  CM Consult  Post Acute Care Choice:    Choice offered to:     DME Arranged:    DME Agency:     HH Arranged:    [place] Agency:     Status of Service:  In process, will continue to follow  If discussed at Long Length of Stay Meetings, dates discussed:    Additional Comments:  Zenon Mayo, RN 05/10/2016, 4:56 PM

## 2016-05-10 NOTE — Anesthesia Postprocedure Evaluation (Signed)
Anesthesia Post Note  Patient: [name]  Procedure(s) Performed: Procedure(s) (LRB): VIDEO ASSISTED THORACOSCOPY (VATS)/WEDGE RESECTION (Right) RIGHT UPPER LOBECTOMY (Right)  Patient location during evaluation: PACU Anesthesia Type: General Level of consciousness: awake and alert Pain management: pain level controlled Vital Signs Assessment: post-procedure vital signs reviewed and stable Respiratory status: spontaneous breathing, nonlabored ventilation, respiratory function stable and patient connected to nasal cannula oxygen Cardiovascular status: blood pressure returned to baseline and stable Postop Assessment: no signs of nausea or vomiting Anesthetic complications: no     Last Vitals:  Vitals:   05/10/16 0342 05/10/16 0404  BP:  108/60  Pulse:  89  Resp: 14 15  Temp:      Last Pain:  Vitals:   05/10/16 0649  TempSrc:   PainSc: 10-Worst pain ever   Pain Goal: Patients Stated Pain Goal: 3 (05/10/16 0649)               Catalina Gravel

## 2016-05-10 NOTE — Evaluation (Addendum)
Physical Therapy Evaluation Patient Details Name: [name] MRN: [id number] DOB: 07/13/1941 Today's Date: 05/10/2016   History of Present Illness  75 y.o. female admitted to [facility] on 05/09/16 for VATS R wedge resection.  She has significant PMHx of low back pain, HTN, COPD, CA (uterine), anxiety, back surgery, R ankle surgery, and L hip surgery.    Clinical Impression  Pt was able to get up and ambulate with significant pain and reliance on O2 during gait and fluctuating O2 sats even with oxygen on (74-92%).  Pt needs reminders for pursed lip breathing and had to take a seated rest break during gait.  She reports good support at home and I am hopeful that she will progress well enough with early mobility to go home with some equipment and HHPT at discharge.   PT to follow acutely for deficits listed below.       Follow Up Recommendations Home health PT;Supervision/Assistance - 24 hour    Equipment Recommendations  Rolling walker with 5" wheels;3in1 (PT)    Recommendations for Other Services   NA    Precautions / Restrictions Precautions Precautions: Fall;Other (comment) Precaution Comments: monitor O2 sats during gait Required Braces or Orthoses: Other Brace/Splint Other Brace/Splint: right sided chest tube and analgesic bulb, O2, foley, monitor lines, IV.      Mobility  Bed Mobility               General bed mobility comments: Pt OOB in the recliner chair  Transfers Overall transfer level: Needs assistance Equipment used:  (pushing WC) Transfers: Sit to/from Stand Sit to Stand: +2 physical assistance;Mod assist         General transfer comment: Two person mod assist to get to standing from low recliner chair.  Verbal cues for safe hand placement.   Ambulation/Gait Ambulation/Gait assistance: +2 safety/equipment;Mod assist Ambulation Distance (Feet): 120 Feet (60'x2 with seated rest break) Assistive device:  (pushing WC) Gait Pattern/deviations: Step-through  pattern;Shuffle;Trunk flexed Gait velocity: decreased Gait velocity interpretation: Below normal speed for age/gender General Gait Details: Used three people, one to support pt, one to manage IV and lines, and one to follow with chair for safety and rest breaks.           Balance Overall balance assessment: Needs assistance Sitting-balance support: Feet supported;Bilateral upper extremity supported Sitting balance-Leahy Scale: Fair   Postural control: Posterior lean Standing balance support: Bilateral upper extremity supported Standing balance-Leahy Scale: Poor                               Pertinent Vitals/Pain Pain Assessment: Faces Faces Pain Scale: Hurts whole lot Pain Location: right chest wall incisional and CT site Pain Descriptors / Indicators: Aching;Burning Pain Intervention(s): Limited activity within patient's tolerance;Monitored during session;Repositioned    Home Living Family/patient expects to be discharged to:: Private residence Living Arrangements: Children Available Help at Discharge: Family;Available 24 hours/day Type of Home: House Home Access: Stairs to enter Entrance Stairs-Rails: None Entrance Stairs-Number of Steps: 2 Home Layout: One level Home Equipment: None      Prior Function Level of Independence: Independent         Comments: per pt she did not use an walker or a cane PTA        Extremity/Trunk Assessment   Upper Extremity Assessment: Generalized weakness;RUE deficits/detail RUE Deficits / Details: right arm limited by right chest wall tube and incision.  Lower Extremity Assessment: Generalized weakness      Cervical / Trunk Assessment: Normal  Communication   Communication: No difficulties  Cognition Arousal/Alertness: Awake/alert Behavior During Therapy: WFL for tasks assessed/performed Overall Cognitive Status: Within Functional Limits for tasks assessed                      General  Comments General comments (skin integrity, edema, etc.): Pt needed O2 during mobility as her sats dropped as low as 74% even with O2 [place] during gait.  She was encouraged to breathe through her nose as she was panting with her mouth open throughout the session.          Assessment/Plan    PT Assessment Patient needs continued PT services  PT Problem List Decreased strength;Decreased activity tolerance;Decreased balance;Decreased mobility;Decreased knowledge of use of DME;Cardiopulmonary status limiting activity;Pain          PT Treatment Interventions DME instruction;Gait training;Stair training;Functional mobility training;Therapeutic activities;Therapeutic exercise;Balance training;Patient/family education    PT Goals (Current goals can be found in the Care Plan section)  Acute Rehab PT Goals Patient Stated Goal: to decrease pain PT Goal Formulation: With patient Time For Goal Achievement: 05/24/16 Potential to Achieve Goals: Good    Frequency Min 3X/week           End of Session Equipment Utilized During Treatment: Oxygen Activity Tolerance: Patient limited by fatigue;Patient limited by pain Patient left: in chair;with call bell/phone within reach Nurse Communication: Other (comment) (RN assisting with session)         Time: 1150-1230 PT Time Calculation (min) (ACUTE ONLY): 40 min   Charges:   PT Evaluation $PT Eval Moderate Complexity: 1 Procedure PT Treatments $Gait Training: 23-37 mins         Rebecca B. Medendorp, PT, DPT [phone]   05/10/2016, 4:57 PM

## 2016-05-10 NOTE — Progress Notes (Addendum)
      [address]       [place] [address]             [phone]       1 Day Post-Op Procedure(s) (LRB): VIDEO ASSISTED THORACOSCOPY (VATS)/WEDGE RESECTION (Right) RIGHT UPPER LOBECTOMY (Right)  Subjective: Patient asleep. She was awakened and then said she had a lot of incisional pain.  Objective: Vital signs in last 24 hours: Temp:  [97.2 F (36.2 C)-98.5 F (36.9 C)] 98.2 F (36.8 C) (10/17 0300) Pulse Rate:  [70-96] 89 (10/17 0404) Cardiac Rhythm: Normal sinus rhythm (10/17 0404) Resp:  [10-28] 15 (10/17 0404) BP: (92-132)/(44-90) 108/60 (10/17 0404) SpO2:  [96 %-100 %] 97 % (10/17 0404) Arterial Line BP: (69-127)/(47-87) 87/69 (10/17 0404) Weight:  [166 lb 14.2 oz (75.7 kg)] 166 lb 14.2 oz (75.7 kg) (10/16 1900)     Intake/Output from previous day: 10/16 0701 - 10/17 0700 In: 3546.7 [P.O.:480; I.V.:2766.7; IV Piggyback:300] Out: 2160 [Urine:800; Blood:800; Chest Tube:560]   Physical Exam:  Cardiovascular: RRR Pulmonary: Clear to auscultation on left and coarse on the right Abdomen: Soft, non tender, bowel sounds present. Extremities: Trace bilateral lower extremity edema. Wounds: Dressing is clean and dry.  . Chest Tubes:to suction, +1 air leak  Lab Results: CBC: Recent Labs  05/09/16 1217 05/10/16 0410  WBC  --  13.4*  HGB 9.5* 9.5*  HCT 28.0* 29.2*  PLT  --  228   BMET:  Recent Labs  05/09/16 1217 05/10/16 0410  NA 141 138  K 3.8 4.0  CL  --  105  CO2  --  26  GLUCOSE  --  119*  BUN  --  13  CREATININE  --  1.62*  CALCIUM  --  7.6*    PT/INR: No results for input(s): LABPROT, INR in the last 72 hours. ABG:  INR: Will add last result for INR, ABG once components are confirmed Will add last 4 CBG results once components are confirmed  Assessment/Plan:  1. CV - SR in the 80's. 2.  Pulmonary - Chest tubes with 560 cc of output since surgery. Chest tubes are to suction. There is a +1 air leak. CXR this am appears  to show no pneumothorax, minor subcutaneous emphysema lower right lateral chest wall. Encourage incentive spirometer. Await final pathology. 3. ABL anemia-H and H stable at 9.5 and 29.2 4. Creatinine 1.62 this am-was 1.28 on admission and UO decreasing. Will give bolus of fluid and maintain IVF for now 5. Foley to remain today 6. Regarding pain control, unable to give Toradol secondary to elevated creatinine. She is on full dose PCA. Ultram and Oxy PRN.  ZIMMERMAN,DONIELLE MPA-C 05/10/2016,8:03 AM Patient seen and examined, agree with above. Either asleep or complaining of severe pain.  Keep CT to suction Creatinine up- will give bolus and continue IVF today- recheck in AM SCD + enoxaparin for DVT prophylaxis  Remo Lipps C. Roxan Hockey, MD [facility] [phone]

## 2016-05-11 ENCOUNTER — Inpatient Hospital Stay (HOSPITAL_COMMUNITY): Payer: Commercial Managed Care - HMO

## 2016-05-11 LAB — URINALYSIS, ROUTINE W REFLEX MICROSCOPIC
Bilirubin Urine: NEGATIVE
Glucose, UA: NEGATIVE mg/dL
Hgb urine dipstick: NEGATIVE
Ketones, ur: NEGATIVE mg/dL
Leukocytes, UA: NEGATIVE
Nitrite: NEGATIVE
Protein, ur: NEGATIVE mg/dL
Specific Gravity, Urine: 1.009 (ref 1.005–1.030)
pH: 6 (ref 5.0–8.0)

## 2016-05-11 LAB — GLUCOSE, CAPILLARY
Glucose-Capillary: 100 mg/dL — ABNORMAL HIGH (ref 65–99)
Glucose-Capillary: 96 mg/dL (ref 65–99)
Glucose-Capillary: 121 mg/dL — ABNORMAL HIGH (ref 65–99)
Glucose-Capillary: 113 mg/dL — ABNORMAL HIGH (ref 65–99)
Glucose-Capillary: 115 mg/dL — ABNORMAL HIGH (ref 65–99)

## 2016-05-11 LAB — COMPREHENSIVE METABOLIC PANEL
ALT: 33 U/L (ref 14–54)
AST: 60 U/L — ABNORMAL HIGH (ref 15–41)
Albumin: 2.7 g/dL — ABNORMAL LOW (ref 3.5–5.0)
Alkaline Phosphatase: 41 U/L (ref 38–126)
Anion gap: 8 (ref 5–15)
BUN: 19 mg/dL (ref 6–20)
CO2: 23 mmol/L (ref 22–32)
Calcium: 7.7 mg/dL — ABNORMAL LOW (ref 8.9–10.3)
Chloride: 100 mmol/L — ABNORMAL LOW (ref 101–111)
Creatinine, Ser: 2.19 mg/dL — ABNORMAL HIGH (ref 0.44–1.00)
GFR calc Af Amer: 24 mL/min — ABNORMAL LOW (ref >60)
GFR calc non Af Amer: 21 mL/min — ABNORMAL LOW (ref >60)
Glucose, Bld: 104 mg/dL — ABNORMAL HIGH (ref 65–99)
Potassium: 3.7 mmol/L (ref 3.5–5.1)
Sodium: 131 mmol/L — ABNORMAL LOW (ref 135–145)
Total Bilirubin: 0.7 mg/dL (ref 0.3–1.2)
Total Protein: 5.4 g/dL — ABNORMAL LOW (ref 6.5–8.1)

## 2016-05-11 LAB — CBC
HCT: 26.8 % — ABNORMAL LOW (ref 36.0–46.0)
Hemoglobin: 8.9 g/dL — ABNORMAL LOW (ref 12.0–15.0)
MCH: 30 pg (ref 26.0–34.0)
MCHC: 33.2 g/dL (ref 30.0–36.0)
MCV: 90.2 fL (ref 78.0–100.0)
Platelets: 193 10*3/uL (ref 150–400)
RBC: 2.97 MIL/uL — ABNORMAL LOW (ref 3.87–5.11)
RDW: 14.2 % (ref 11.5–15.5)
WBC: 14.2 10*3/uL — ABNORMAL HIGH (ref 4.0–10.5)

## 2016-05-11 IMAGING — CR DG CHEST 1V PORT
1 series · 1 of 1 positions shown · non-contrast
Comparison: [DATE].

CLINICAL DATA: Chest tube .

EXAM:
PORTABLE CHEST 1 VIEW

[AP]
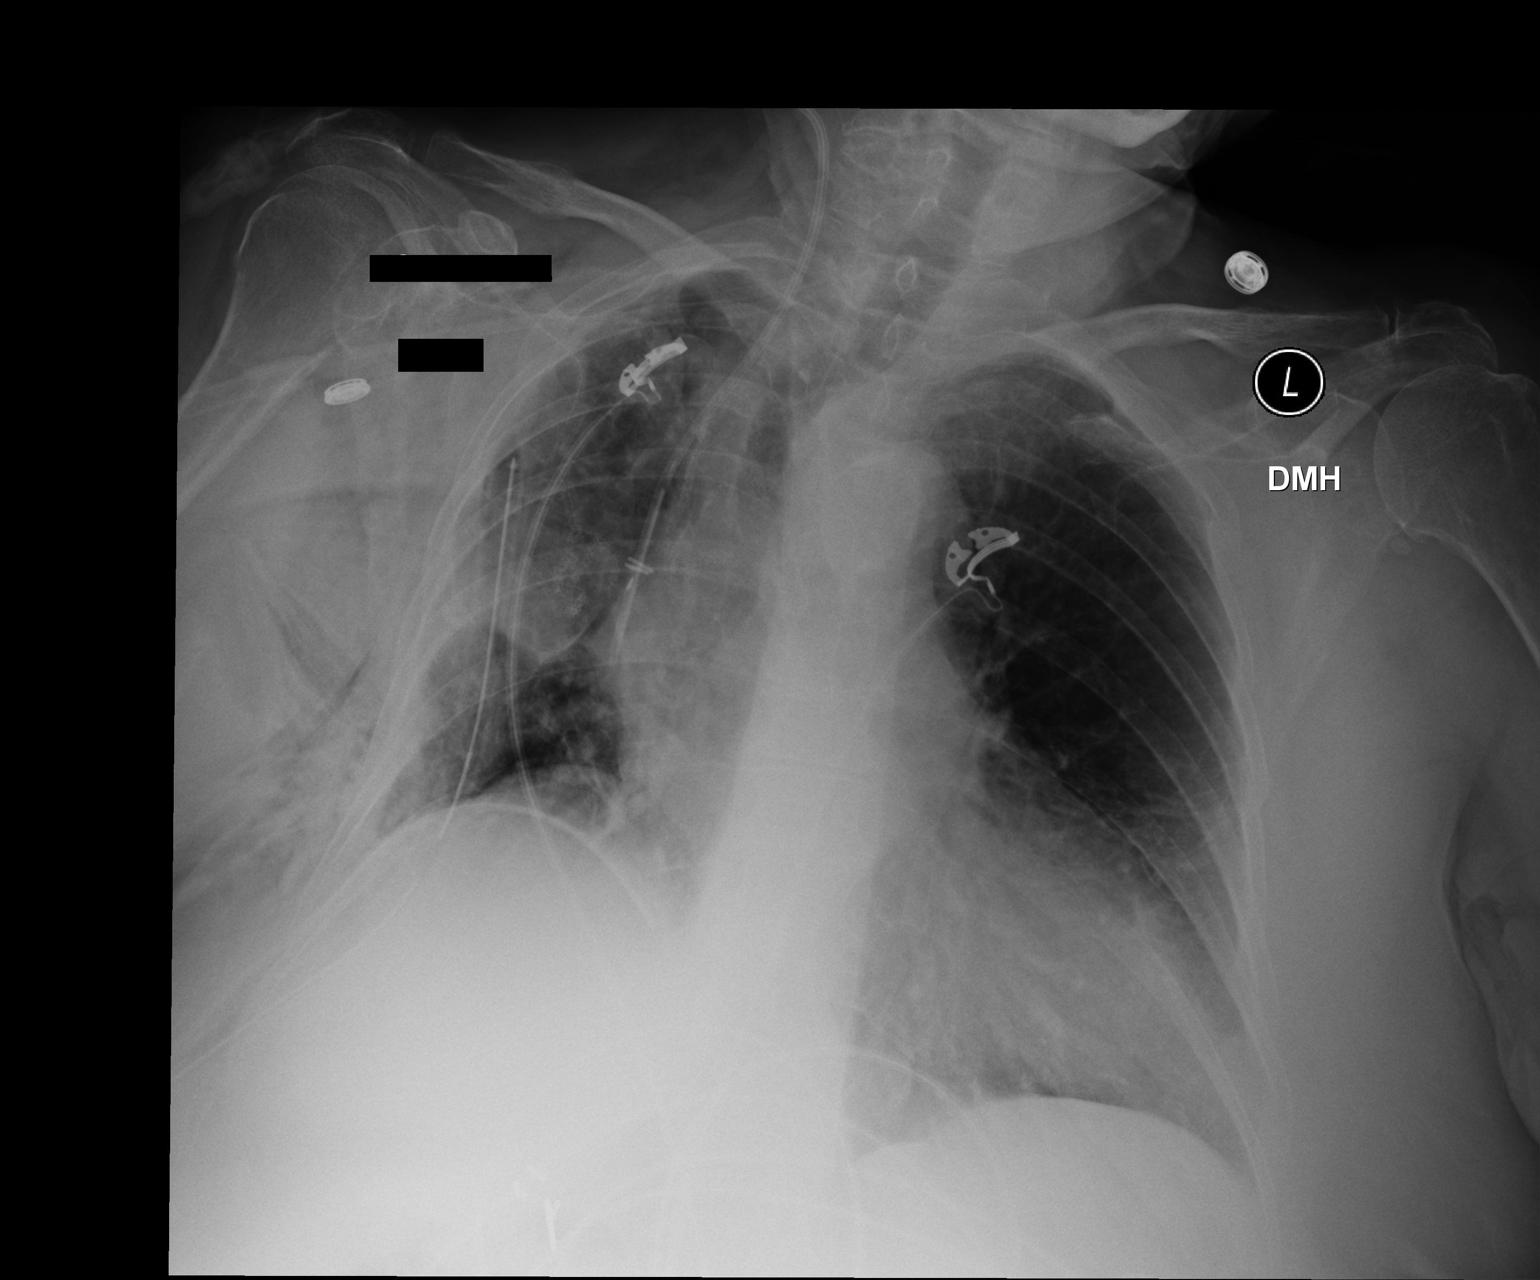

[1 of 1 positions shown; findings below may reference images not displayed]

FINDINGS: Right chest tubes in stable position. Right IJ line stable position.
Postsurgical changes right lung. No pneumothorax. Cardiomegaly with
normal pulmonary vascularity. Right chest wall subcutaneous
emphysema.
IMPRESSION: Right chest tubes in stable position. Postsurgical changes right
lung. No pneumothorax. Right chest wall subcutaneous emphysema again
noted.

## 2016-05-11 MED ORDER — GUAIFENESIN ER 600 MG PO TB12
600.0000 mg | ORAL_TABLET | Freq: Two times a day (BID) | ORAL | Status: AC
  Administered 2016-05-11 – 2016-05-15 (×9): 600 mg via ORAL
  Filled 2016-05-11 (×9): qty 1

## 2016-05-11 MED ORDER — INSULIN ASPART 100 UNIT/ML ~~LOC~~ SOLN
0.0000 [IU] | Freq: Three times a day (TID) | SUBCUTANEOUS | Status: DC
  Administered 2016-05-11: 1 [IU] via SUBCUTANEOUS
  Administered 2016-05-12: 5 [IU] via SUBCUTANEOUS
  Administered 2016-05-13: 2 [IU] via SUBCUTANEOUS
  Administered 2016-05-13 (×2): 1 [IU] via SUBCUTANEOUS

## 2016-05-11 MED ORDER — ALBUTEROL SULFATE (2.5 MG/3ML) 0.083% IN NEBU
2.5000 mg | INHALATION_SOLUTION | Freq: Four times a day (QID) | RESPIRATORY_TRACT | Status: DC
  Administered 2016-05-11 – 2016-05-12 (×8): 2.5 mg via RESPIRATORY_TRACT
  Filled 2016-05-11 (×7): qty 3

## 2016-05-11 MED ORDER — ALBUTEROL SULFATE (2.5 MG/3ML) 0.083% IN NEBU
INHALATION_SOLUTION | RESPIRATORY_TRACT | Status: AC
  Administered 2016-05-11: 2.5 mg via RESPIRATORY_TRACT
  Filled 2016-05-11: qty 3

## 2016-05-11 NOTE — Progress Notes (Signed)
2 Days Post-Op Procedure(s) (LRB): VIDEO ASSISTED THORACOSCOPY (VATS)/WEDGE RESECTION (Right) RIGHT UPPER LOBECTOMY (Right) Subjective: "I am still having pain" Denies nausea  Objective: Vital signs in last 24 hours: Temp:  [97.2 F (36.2 C)-99.1 F (37.3 C)] 99.1 F (37.3 C) (10/18 0700) Pulse Rate:  [88-91] 89 (10/18 0323) Cardiac Rhythm: Normal sinus rhythm (10/18 0700) Resp:  [11-18] 14 (10/18 0400) BP: (91-130)/(53-74) 127/60 (10/18 0323) SpO2:  [90 %-97 %] 97 % (10/18 0400)  Hemodynamic parameters for last 24 hours:    Intake/Output from previous day: 10/17 0701 - 10/18 0700 In: 3670 [P.O.:300; I.V.:2800; IV Piggyback:550] Out: 5848 [Urine:1150; Chest Tube:420] Intake/Output this shift: Total I/O In: -  Out: 30 [Urine:30]  General appearance: alert, cooperative and no distress Neurologic: intact Heart: regular rate and rhythm Lungs: diminished breath sounds bibasilar and wheezing bilaterally Abdomen: normal findings: soft, non-tender Wound: clean and dry, + air leak  Lab Results:  Recent Labs  05/10/16 0410 05/11/16 0444  WBC 13.4* 14.2*  HGB 9.5* 8.9*  HCT 29.2* 26.8*  PLT 228 193   BMET:  Recent Labs  05/10/16 0410 05/11/16 0444  NA 138 131*  K 4.0 3.7  CL 105 100*  CO2 26 23  GLUCOSE 119* 104*  BUN 13 19  CREATININE 1.62* 2.19*  CALCIUM 7.6* 7.7*    PT/INR: No results for input(s): LABPROT, INR in the last 72 hours. ABG    Component Value Date/Time   PHART 7.324 (L) 05/09/2016 1217   HCO3 26.5 05/09/2016 1217   TCO2 28 05/09/2016 1217   ACIDBASEDEF 0.3 05/05/2016 0955   O2SAT 99.0 05/09/2016 1217   CBG (last 3)   Recent Labs  05/10/16 2259 05/11/16 0323 05/11/16 0803  GLUCAP 98 100* 96    Assessment/Plan: S/P Procedure(s) (LRB): VIDEO ASSISTED THORACOSCOPY (VATS)/WEDGE RESECTION (Right) RIGHT UPPER LOBECTOMY (Right) POD # 2  CV- stable, BP OK  RESP_ continue pulmonary hygiene  Wheezing despite spiriva and dulera, will  change albuterol to QID  Small air leak- keep both CT to suction  RENAL- creatinine continues to rise, now up to 2.19, EGFR= 24, 1.28 and 47 preop  Consistent with acute kidney injury in setting of CKD  Continue IVF  Recheck labs in AM  ENDO- CBG well controlled- change to AC/HS  DVT prophylaxis- SCD + enoxaparin  GI - no issues  Pain control- PCA + On-Q   LOS: 2 days    [name] 05/11/2016

## 2016-05-12 ENCOUNTER — Inpatient Hospital Stay (HOSPITAL_COMMUNITY): Payer: Commercial Managed Care - HMO

## 2016-05-12 LAB — BASIC METABOLIC PANEL
Anion gap: 5 (ref 5–15)
BUN: 14 mg/dL (ref 6–20)
CO2: 24 mmol/L (ref 22–32)
Calcium: 7.6 mg/dL — ABNORMAL LOW (ref 8.9–10.3)
Chloride: 107 mmol/L (ref 101–111)
Creatinine, Ser: 1.39 mg/dL — ABNORMAL HIGH (ref 0.44–1.00)
GFR calc Af Amer: 42 mL/min — ABNORMAL LOW (ref >60)
GFR calc non Af Amer: 37 mL/min — ABNORMAL LOW (ref >60)
Glucose, Bld: 115 mg/dL — ABNORMAL HIGH (ref 65–99)
Potassium: 3.3 mmol/L — ABNORMAL LOW (ref 3.5–5.1)
Sodium: 136 mmol/L (ref 135–145)

## 2016-05-12 LAB — GLUCOSE, CAPILLARY
Glucose-Capillary: 282 mg/dL — ABNORMAL HIGH (ref 65–99)
Glucose-Capillary: 109 mg/dL — ABNORMAL HIGH (ref 65–99)
Glucose-Capillary: 115 mg/dL — ABNORMAL HIGH (ref 65–99)
Glucose-Capillary: 104 mg/dL — ABNORMAL HIGH (ref 65–99)

## 2016-05-12 LAB — CBC
HCT: 23.4 % — ABNORMAL LOW (ref 36.0–46.0)
Hemoglobin: 7.8 g/dL — ABNORMAL LOW (ref 12.0–15.0)
MCH: 29.4 pg (ref 26.0–34.0)
MCHC: 33.3 g/dL (ref 30.0–36.0)
MCV: 88.3 fL (ref 78.0–100.0)
Platelets: 185 10*3/uL (ref 150–400)
RBC: 2.65 MIL/uL — ABNORMAL LOW (ref 3.87–5.11)
RDW: 14.3 % (ref 11.5–15.5)
WBC: 9.8 10*3/uL (ref 4.0–10.5)

## 2016-05-12 IMAGING — CR DG CHEST 1V PORT
1 series · 1 of 1 positions shown · non-contrast
Comparison: [DATE], [DATE], chest CT [DATE]

CLINICAL DATA: 75-year-old female with a history of shortness of
breath and lower back pain. Status post lobectomy [DATE]. Right
upper lobe adenocarcinoma, with VATS right upper lobectomy.

EXAM:
PORTABLE CHEST 1 VIEW

[AP]
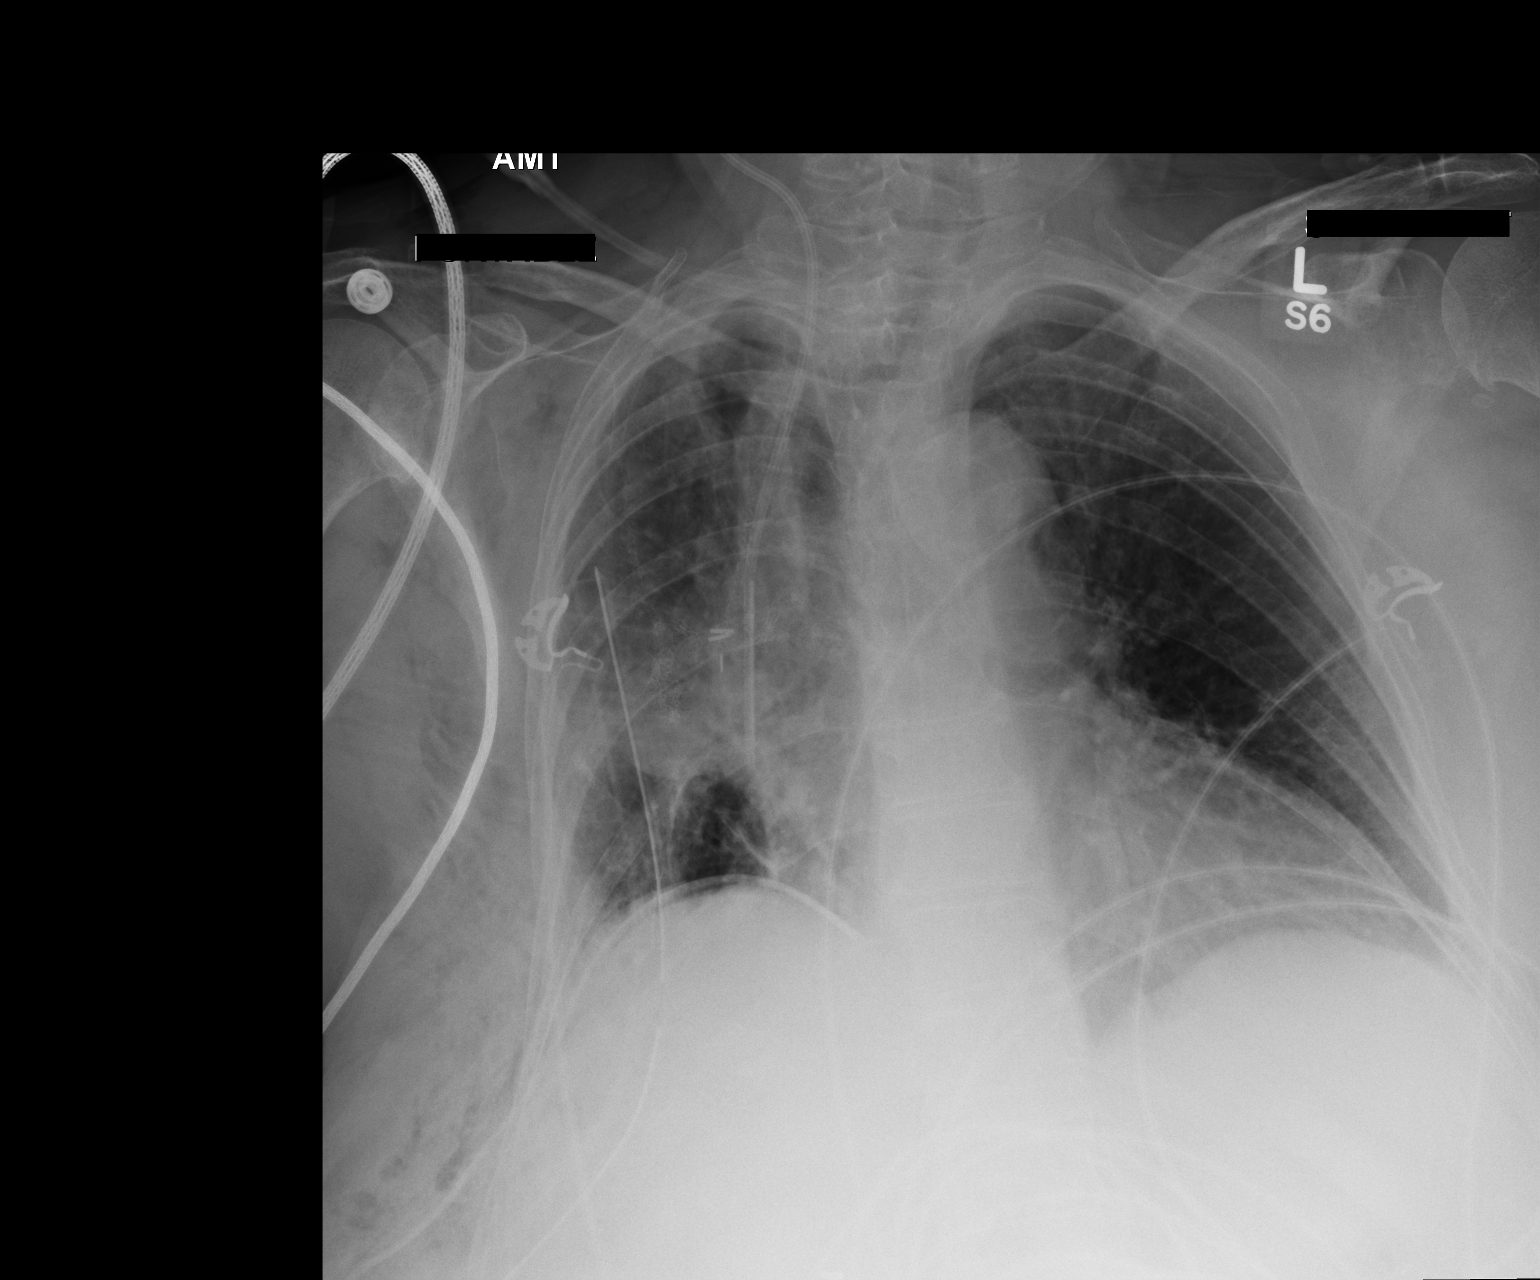

[1 of 1 positions shown; findings below may reference images not displayed]

FINDINGS: Cardiomediastinal silhouette unchanged in size and contour. Borders
partially obscured by overlying lung and pleural disease.

Unchanged position of right IJ central venous catheter, appearing to
terminate in the superior vena cava.

Surgical changes of right lobectomy.

2 right-sided thoracostomy tube, 1 terminating directed toward the
cardiophrenic angle, an 1 directed cephalad. No visualized
pneumothorax.

Ill-defined airspace disease within the right lung, similar to
prior. Aeration minimally improved.

Subcutaneous emphysema on the right chest wall is unchanged.
IMPRESSION: Minimal improved aeration of the right lung status post right upper
lobectomy, with no visualized pneumothorax. Background of
atelectasis/consolidation.

Unchanged position of 2 right-sided thoracostomy tubes and a right
IJ central venous catheter.

## 2016-05-12 MED ORDER — DIAZEPAM 5 MG PO TABS: 5.0000 mg | ORAL_TABLET | Freq: Every evening | ORAL | Status: DC | PRN

## 2016-05-12 NOTE — Progress Notes (Signed)
Physical Therapy Treatment Patient Details Name: [name] MRN: [id number] DOB: [birth year] Today's Date: 05/12/2016    History of Present Illness 75 y.o. female admitted to [facility] on 05/09/16 for VATS R wedge resection.  She has significant PMHx of low back pain, HTN, COPD, CA (uterine), anxiety, back surgery, R ankle surgery, and L hip surgery.      PT Comments    Pt is not progressing as quickly as I originally anticipate with her mobility.  She continues to require mod assist on her feet and has poor activity tolerance (pain, HR 90-150 and O2 sats 80-96% on 3 L O2 [place]).  Pt would benefit from post acute rehab and I asked for our inpatient rehab department to screen her for appropriateness for their unit.  PT will continue to follow acutely.   Follow Up Recommendations  CIR     Equipment Recommendations  Rolling walker with 5" wheels;3in1 (PT)    Recommendations for Other Services Rehab consult     Precautions / Restrictions Precautions Precautions: Fall;Other (comment) Precaution Comments: monitor O2 sats and HR Other Brace/Splint: right side chest tube, O2, monitor, IV    Mobility  Bed Mobility Overal bed mobility: Needs Assistance Bed Mobility: Supine to Sit     Supine to sit: Mod assist     General bed mobility comments: Mod assist to support trunk to get to sitting EOB.  Pt doesn't seem to be helping much with her arms as much as letting therapist lift her.  I encouraged her to help more as it would hurt less.   Transfers Overall transfer level: Needs assistance Equipment used:  (WC) Transfers: Sit to/from Stand Sit to Stand: +2 safety/equipment;Mod assist         General transfer comment: Two person mod assist to support trunk to get to standing.  Verbal cues for safe hand placement, posterior preference throughout transitions.   Ambulation/Gait Ambulation/Gait assistance: +2 safety/equipment;Mod assist Ambulation Distance (Feet): 200 Feet Assistive  device:  (push WC with O2 CT, lines) Gait Pattern/deviations: Step-through pattern;Shuffle;Staggering right;Staggering left Gait velocity: decreased Gait velocity interpretation: <1.8 ft/sec, indicative of risk for recurrent falls General Gait Details: Staggering gait pattern even with both hands supported on the WC.  Initially very short, choppy steps with posterior lean, once forward momentum was established she did better, however, she had significant difficulty both keeping her balance and trying to steer the WC down the straight hallway.  HR increased from 90-150 (so we had to take multiple seated rest breaks) and O2 sats ranged from 80-96% on 3 L O2 [place] with both seated and standing rest breaks with cues for pursed lip breathing.           Balance Overall balance assessment: Needs assistance Sitting-balance support: Feet supported;Bilateral upper extremity supported Sitting balance-Leahy Scale: Poor Sitting balance - Comments: needs min assist EOB  Postural control: Posterior lean Standing balance support: Bilateral upper extremity supported Standing balance-Leahy Scale: Poor Standing balance comment: needs mod assist in standing.                     Cognition Arousal/Alertness: Lethargic;Suspect due to medications Behavior During Therapy: [facility] for tasks assessed/performed Overall Cognitive Status: Impaired/Different from baseline Area of Impairment: Orientation;Attention;Following commands;Memory;Safety/judgement;Awareness;Problem solving Orientation Level: Disoriented to;Time (September, 2009) Current Attention Level: Sustained Memory: Decreased short-term memory Following Commands: Follows one step commands with increased time Safety/Judgement: Decreased awareness of safety;Decreased awareness of deficits Awareness: Intellectual Problem Solving: Slow processing;Decreased initiation;Difficulty sequencing;Requires verbal  cues;Requires tactile cues General Comments: Pt is  slow to process, at times saying off comment remarks that are not at all on topic, slow to process and at times needs commands repeated.            Pertinent Vitals/Pain Pain Assessment: 0-10 Pain Score: 8  Pain Location: incisional Pain Descriptors / Indicators: Aching;Burning Pain Intervention(s): Limited activity within patient's tolerance;Monitored during session;Repositioned           PT Goals (current goals can now be found in the care plan section) Acute Rehab PT Goals Patient Stated Goal: to decrease pain Progress towards PT goals: Progressing toward goals (but not as quickly as originally anticipated)    Frequency    Min 3X/week      PT Plan Discharge plan needs to be updated       End of Session Equipment Utilized During Treatment: Oxygen Activity Tolerance: Patient limited by fatigue;Patient limited by pain;Treatment limited secondary to medical complications (Comment) (limited by increased HR and decreased O2 sats during gait) Patient left: in chair;with call bell/phone within reach     Time: 1345-1409 PT Time Calculation (min) (ACUTE ONLY): 24 min  Charges:  $Gait Training: 23-37 mins                      Rebecca B. Medendorp, PT, DPT [phone]   05/12/2016, 3:45 PM

## 2016-05-12 NOTE — Progress Notes (Signed)
      [address]       [place] [address]             [phone]      3 Days Post-Op Procedure(s) (LRB): VIDEO ASSISTED THORACOSCOPY (VATS)/WEDGE RESECTION (Right) RIGHT UPPER LOBECTOMY (Right)   Subjective:  Continues to have pain.  Not eating much due to nausea.  Confusion yesterday afternoon and overnight.  Objective: Vital signs in last 24 hours: Temp:  [97.9 F (36.6 C)-99.7 F (37.6 C)] 98.4 F (36.9 C) (10/19 0750) Pulse Rate:  [86-108] 86 (10/19 0750) Cardiac Rhythm: Normal sinus rhythm (10/19 0700) Resp:  [14-22] 14 (10/19 0750) BP: (114-161)/(55-76) 149/69 (10/19 0750) SpO2:  [90 %-99 %] 99 % (10/19 0835)  Intake/Output from previous day: 10/18 0701 - 10/19 0700 In: 2858.3 [P.O.:240; I.V.:2618.3] Out: 1900 [Urine:1480; Chest Tube:420]  General appearance: cooperative and no distress Heart: regular rate and rhythm Lungs: diminished breath sounds bilaterally Wound: clean and dry  Lab Results:  Recent Labs  05/11/16 0444 05/12/16 0406  WBC 14.2* 9.8  HGB 8.9* 7.8*  HCT 26.8* 23.4*  PLT 193 185   BMET:  Recent Labs  05/11/16 0444 05/12/16 0406  NA 131* 136  K 3.7 3.3*  CL 100* 107  CO2 23 24  GLUCOSE 104* 115*  BUN 19 14  CREATININE 2.19* 1.39*  CALCIUM 7.7* 7.6*    PT/INR: No results for input(s): LABPROT, INR in the last 72 hours. ABG    Component Value Date/Time   PHART 7.324 (L) 05/09/2016 1217   HCO3 26.5 05/09/2016 1217   TCO2 28 05/09/2016 1217   ACIDBASEDEF 0.3 05/05/2016 0955   O2SAT 99.0 05/09/2016 1217   CBG (last 3)   Recent Labs  05/11/16 1634 05/11/16 2107 05/12/16 0748  GLUCAP 113* 115* 282*    Assessment/Plan: S/P Procedure(s) (LRB): VIDEO ASSISTED THORACOSCOPY (VATS)/WEDGE RESECTION (Right) RIGHT UPPER LOBECTOMY (Right)  1. Chest tube- minimal output, + air leak, posterior tube ordered to be removed per Dr. Roxan Hockey 2. Pulm- continue pulm toilet, CXR shows improved aeration, no  pneumothorax, weaning oxygen as tolerated 3. Renal- creatinine improved, down to 1.39, remains on IV fluid, can d/c once patient's oral intake improves 4. Dispo- patient stable, d/c posterior chest tube today, leave chest tubes on suction    LOS: 3 days    Hernandez, Felicia 05/12/2016

## 2016-05-12 NOTE — Progress Notes (Signed)
Inpatient Rehabilitation  Received a prescreen request from PT.  Per chart review note that patient currently with chest tube and make slow progress with PT.  Plan to follow along for therapy progress to determine best post acute rehab venue.  Additionally, recommend an OT consult given that would be needed to gain insurance authorization for a potential IP Rehab admission.  Please call with questions.   Carmelia Roller., CCC/SLP Admission Coordinator  [name]  Cell [phone]

## 2016-05-13 ENCOUNTER — Inpatient Hospital Stay (HOSPITAL_COMMUNITY): Payer: Commercial Managed Care - HMO

## 2016-05-13 LAB — BASIC METABOLIC PANEL
Anion gap: 4 — ABNORMAL LOW (ref 5–15)
BUN: 13 mg/dL (ref 6–20)
CO2: 26 mmol/L (ref 22–32)
Calcium: 7.9 mg/dL — ABNORMAL LOW (ref 8.9–10.3)
Chloride: 107 mmol/L (ref 101–111)
Creatinine, Ser: 1.02 mg/dL — ABNORMAL HIGH (ref 0.44–1.00)
GFR calc Af Amer: >60 mL/min (ref >60)
GFR calc non Af Amer: 53 mL/min — ABNORMAL LOW (ref >60)
Glucose, Bld: 106 mg/dL — ABNORMAL HIGH (ref 65–99)
Potassium: 3.4 mmol/L — ABNORMAL LOW (ref 3.5–5.1)
Sodium: 137 mmol/L (ref 135–145)

## 2016-05-13 LAB — CBC
HCT: 22.8 % — ABNORMAL LOW (ref 36.0–46.0)
Hemoglobin: 7.6 g/dL — ABNORMAL LOW (ref 12.0–15.0)
MCH: 30.4 pg (ref 26.0–34.0)
MCHC: 33.3 g/dL (ref 30.0–36.0)
MCV: 91.2 fL (ref 78.0–100.0)
Platelets: 193 10*3/uL (ref 150–400)
RBC: 2.5 MIL/uL — ABNORMAL LOW (ref 3.87–5.11)
RDW: 14.3 % (ref 11.5–15.5)
WBC: 8.2 10*3/uL (ref 4.0–10.5)

## 2016-05-13 LAB — GLUCOSE, CAPILLARY
Glucose-Capillary: 126 mg/dL — ABNORMAL HIGH (ref 65–99)
Glucose-Capillary: 121 mg/dL — ABNORMAL HIGH (ref 65–99)
Glucose-Capillary: 182 mg/dL — ABNORMAL HIGH (ref 65–99)
Glucose-Capillary: 91 mg/dL (ref 65–99)

## 2016-05-13 LAB — URINE CULTURE: Culture: NO GROWTH

## 2016-05-13 IMAGING — DX DG CHEST 1V PORT
1 series · 1 of 1 positions shown · non-contrast
Comparison: Radiograph [DATE].

CLINICAL DATA: Status post right lobectomy.

EXAM:
PORTABLE CHEST 1 VIEW

[chest ap]
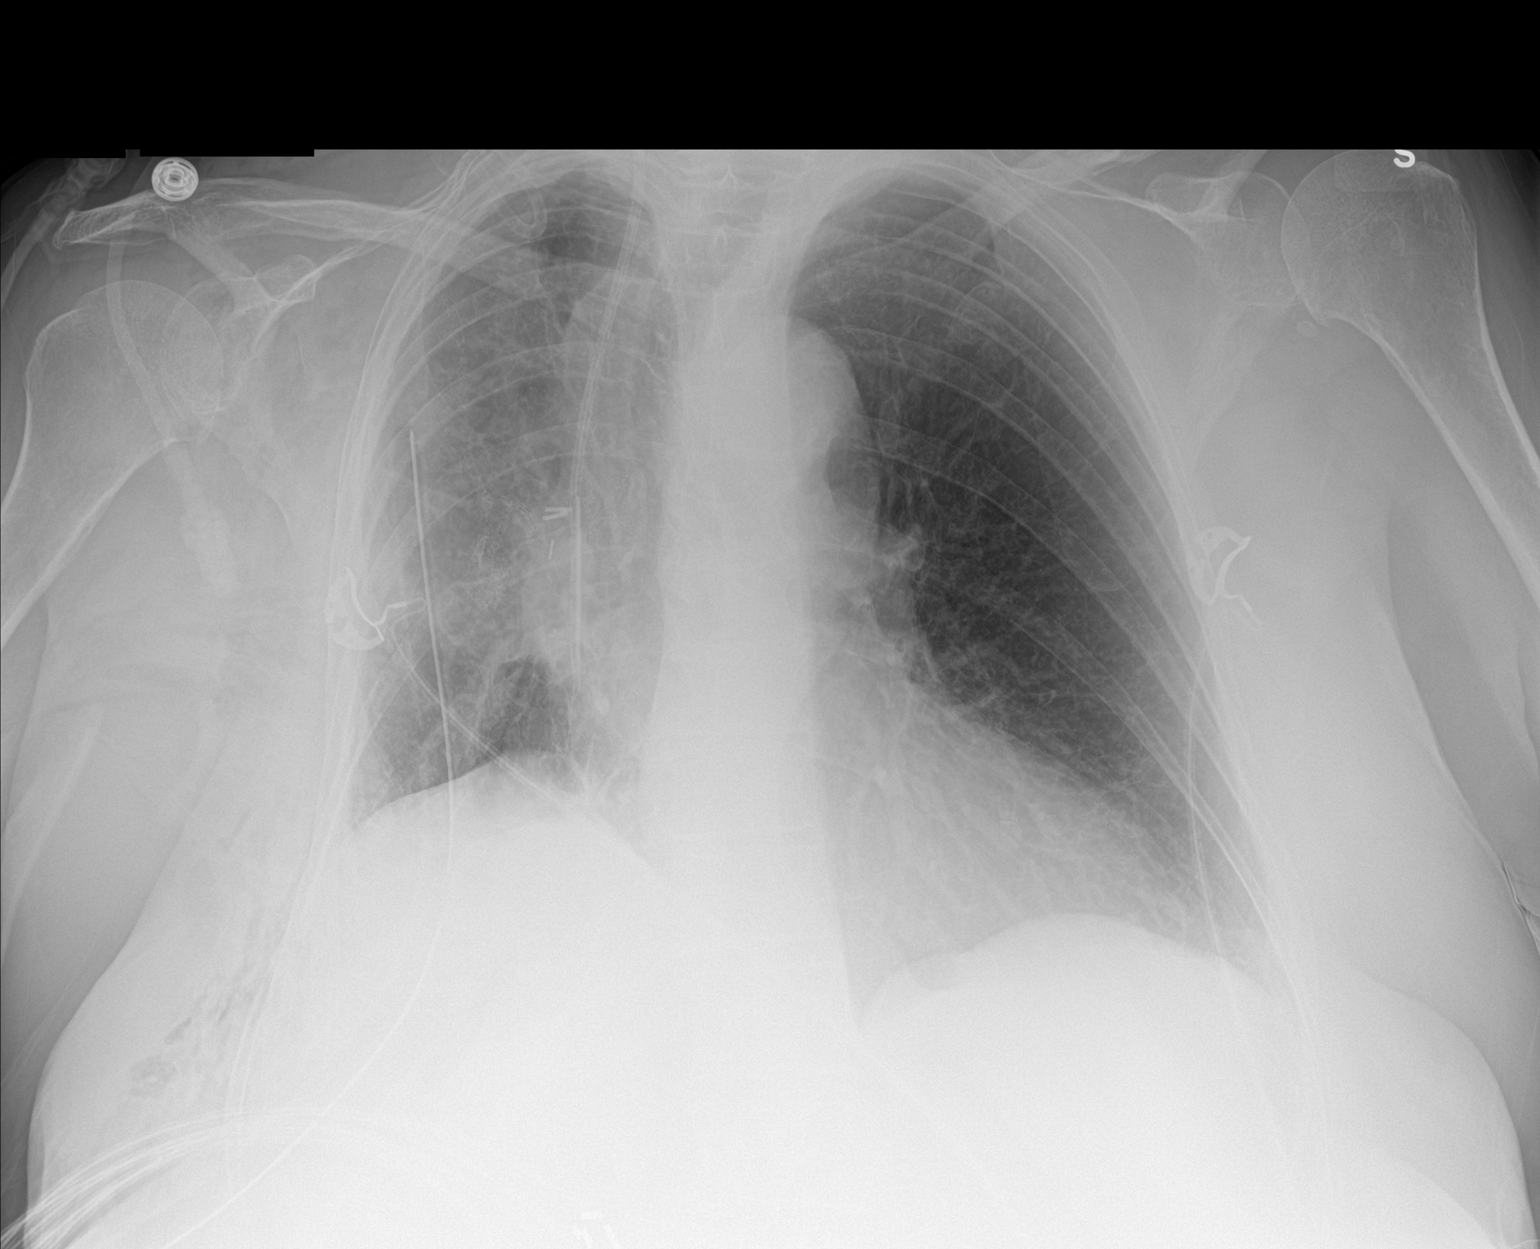

[1 of 1 positions shown; findings below may reference images not displayed]

FINDINGS: Stable cardiomediastinal silhouette. Left lung is clear.
Postsurgical changes are again noted in the right lung. Right
internal jugular venous catheter is unchanged in position.
Right-sided chest tube is unchanged position without evidence of
pneumothorax. Mild subcutaneous emphysema is seen over right lateral
chest wall. Right hilar surgical clips are noted
IMPRESSION: Stable postsurgical changes seen in right chest. Right-sided chest
tube is noted without evidence of pneumothorax.

## 2016-05-13 MED ORDER — POTASSIUM CHLORIDE 10 MEQ/50ML IV SOLN
10.0000 meq | INTRAVENOUS | Status: AC
  Administered 2016-05-13 (×4): 10 meq via INTRAVENOUS
  Filled 2016-05-13 (×3): qty 50

## 2016-05-13 MED ORDER — ALBUTEROL SULFATE (2.5 MG/3ML) 0.083% IN NEBU
2.5000 mg | INHALATION_SOLUTION | Freq: Four times a day (QID) | RESPIRATORY_TRACT | Status: DC | PRN
  Administered 2016-05-14 – 2016-05-16 (×3): 2.5 mg via RESPIRATORY_TRACT
  Filled 2016-05-13 (×3): qty 3

## 2016-05-13 NOTE — Care Management Important Message (Signed)
Important Message  Patient Details  Name: [name] MRN: [id number] Date of Birth: 1940/10/10   Medicare Important Message Given:  Yes    Shealy, Nia Abena 05/13/2016, 11:15 AM

## 2016-05-13 NOTE — Discharge Instructions (Signed)
Thoracoscopy, Care After Refer to this sheet in the next few weeks. These instructions provide you with information on caring for yourself after your procedure. Your caregiver may also give you more specific instructions. Your procedure has been planned according to current medical practices, but problems sometimes occur. Call your caregiver if you have any problems or questions after your procedure. HOME CARE INSTRUCTIONS   Only take over-the-counter or prescription medications as directed.  Only take pain medications (narcotics) as directed.  Do not drive until your caregiver approves. Driving while taking narcotics or soon after surgery can be dangerous, so discuss the specific timing with your caregiver.  Avoid activities that use your chest muscles, such as lifting heavy objects, for at least 3-4 weeks.   Take deep breaths to expand the lungs and to protect against pneumonia.  Do breathing exercises as directed by your caregiver. If you were given an incentive spirometer to help with breathing, use it as directed.  You may resume a normal diet and activities when you feel you are able to or as directed.  Do not take a bath until your caregiver says it is OK. Use the shower instead.   Keep the bandage (dressing) covering the area where the chest tube was inserted (incision site) dry for 48 hours. After 48 hours, remove the dressing unless there is new drainage.  Remove dressings as directed by your caregiver.  Change dressings if necessary or as directed.  Keep all follow-up appointments. It is important for you to see your caregiver after surgery to discuss appropriate follow-up care and surveillance, if it is necessary. SEEK MEDICAL CARE:  You feel excessive or increasing pain at an incision site.  You notice bleeding, skin irritation, drainage, swelling, or redness at an incision site.  There is a bad smell coming from an incision or dressing.  It feels like your heart is  fluttering or beating rapidly.  Your pain medication does not relieve your pain. SEEK IMMEDIATE MEDICAL CARE IF:   You have a fever.   You have chest pain.  You have a rash.  You have shortness of breath.  You have trouble breathing.   You feel weak, lightheaded, dizzy, or faint.  MAKE SURE YOU:   Understand these instructions.   Will watch your condition.   Will get help right away if you are not doing well or get worse.   This information is not intended to replace advice given to you by your health care provider. Make sure you discuss any questions you have with your health care provider.   Document Released: 11/05/2012 Document Revised: 08/01/2014 Document Reviewed: 11/05/2012 Elsevier Interactive Patient Education [employer].

## 2016-05-13 NOTE — Progress Notes (Signed)
      [address]       [place] [address]             [phone]      Doing well on water seal, no leak  Path T1a, N0 stage IA- patient informed  Revonda Standard. Roxan Hockey, MD [facility] [phone]

## 2016-05-13 NOTE — Consult Note (Signed)
   [facility] Inpatient Consult   05/13/2016  AERIE DONICA 1940-12-11 [id number]    Spoke with Ms. Dickens at bedside. She has been active with [place] Management program. She has been followed by [facility] [place]. She is currently in the stepdown unit. Came to bedside to speak with her while she was sitting up in chair. Made her aware that [place] Management will follow up with her post discharge. Will make appropriate [place] team referral once disposition plans are known. Will continue to follow and make inpatient RNCM aware that [place] Management is following. Contact information left at bedside.   Marthenia Rolling, MSN-Ed, RN,BSN [facility] Liaison [phone]

## 2016-05-13 NOTE — Clinical Social Work Note (Signed)
Clinical Social Work Assessment  Patient Details  Name: [name] MRN: [id number] Date of Birth: 07-01-1941  Date of referral:  05/13/16               Reason for consult:  Facility Placement, Discharge Planning                Permission sought to share information with:  Chartered certified accountant granted to share information::  Yes, Verbal Permission Granted  Name::        Agency::  SNF's  Relationship::     Contact Information:     Housing/Transportation Living arrangements for the past 2 months:  Single Family Home Source of Information:  Patient, Medical Team Patient Interpreter Needed:  None Criminal Activity/Legal Involvement Pertinent to Current Situation/Hospitalization:  No - Comment as needed Significant Relationships:  Adult Children, Siblings Lives with:  Self Do you feel safe going back to the place where you live?  Yes Need for family participation in patient care:  Yes (Comment)  Care giving concerns:  PT recommending CIR once medically stable for discharge. CSW initiating SNF backup.   Social Worker assessment / plan:  CSW met with patient. No supports at bedside. CSW introduced role and explained that PT recommendations would be discussed. Patient agreeable to SNF backup. No preference in facilities. Patient reports that she lives alone but her sister is moving in with her to help care for her. CSW will wait until chest tube removed before completing FL2 and faxing out due to risk of automatic denials from SNF's. No further concerns. CSW encouraged patient to contact CSW as needed. CSW will continue to follow patient for support and facilitate discharge to SNF, if appropriate, once medically stable.   Employment status:  Retired Nurse, adult PT Recommendations:  Inpatient [place] / Referral to community resources:  [place]  Patient/Family's Response to care:  Patient agreeable to  SNF backup. Patient's family supportive and involved in patient's care. Patient appreciated social work intervention.  Patient/Family's Understanding of and Emotional Response to Diagnosis, Current Treatment, and Prognosis:  Patient understands the need for rehab prior to returning home. Patient appears happy with hospital care.  Emotional Assessment Appearance:  Appears stated age Attitude/Demeanor/Rapport:  Other (Pleasant) Affect (typically observed):  Accepting, Appropriate, Calm, Pleasant Orientation:  Oriented to Self, Oriented to Place, Oriented to  Time, Oriented to Situation Alcohol / Substance use:  Never Used Psych involvement (Current and /or in the community):  No (Comment)  Discharge Needs  Concerns to be addressed:  Care Coordination Readmission within the last 30 days:  No Current discharge risk:  Dependent with Mobility, Lives alone Barriers to Discharge:  [place], LCSW 05/13/2016, 10:59 AM

## 2016-05-13 NOTE — Progress Notes (Signed)
Physical Therapy Treatment Patient Details Name: [name] MRN: [id number] DOB: Jul 22, 1941 Today's Date: 05/13/2016    History of Present Illness 75 y.o. female admitted to [facility] on 05/09/16 for VATS R wedge resection.  She has significant PMHx of low back pain, HTN, COPD, CA (uterine), anxiety, back surgery, R ankle surgery, and L hip surgery.      PT Comments    Better mobility and activity tolerance today.  She continues to be mildly confused with decreased safety awareness.  Balance and strength are still impaired and put her at a fall risk.  Pt continues to be a potential CIR candidate before returning home.  PT will continue to follow acutely.   Follow Up Recommendations  CIR     Equipment Recommendations  Rolling walker with 5" wheels;3in1 (PT)    Recommendations for Other Services Rehab consult     Precautions / Restrictions Precautions Precautions: Fall;Other (comment) Precaution Comments: monitor O2 sats and HR Required Braces or Orthoses: Other Brace/Splint Other Brace/Splint: right side chest tube, O2, monitor, IV Restrictions Weight Bearing Restrictions: (P) No    Mobility  Bed Mobility               General bed mobility comments: Pt seated EOB  Transfers Overall transfer level: Needs assistance Equipment used:  (WC) Transfers: Sit to/from Stand Sit to Stand: Min assist;+2 safety/equipment         General transfer comment: Min assist to support trunk during transitions, verbal cues for safe hand placement.   Ambulation/Gait Ambulation/Gait assistance: +2 safety/equipment Ambulation Distance (Feet): 300 Feet (150, seated rest break, 150) Assistive device:  (WC) Gait Pattern/deviations: Step-through pattern;Staggering left;Staggering right Gait velocity: decreased Gait velocity interpretation: Below normal speed for age/gender General Gait Details: Pt with continued staggering gait pattern with assist needed to steer WC that is holding  lines.  Chair to follow for safety and encourage increased gait distance.  Only needed one seated rest break today and cardiopulmonary status was better on 3 L O2 [place] during gait.  HR remained in the low 100s and O2 sats remained 88-94 on 3 L O2  with cues for pursed lip breathing not open mouth breathing during gait.           Balance Overall balance assessment: Needs assistance Sitting-balance support: Feet supported;Bilateral upper extremity supported Sitting balance-Leahy Scale: Fair     Standing balance support: Bilateral upper extremity supported Standing balance-Leahy Scale: Poor                      Cognition Arousal/Alertness: Awake/alert Behavior During Therapy: WFL for tasks assessed/performed Overall Cognitive Status: Impaired/Different from baseline Area of Impairment: Safety/judgement;Awareness;Problem solving Orientation Level:  (oreinted to month, year, place today) Current Attention Level: Sustained     Safety/Judgement: Decreased awareness of safety;Decreased awareness of deficits Awareness: Intellectual Problem Solving: Slow processing;Difficulty sequencing;Requires verbal cues;Requires tactile cues General Comments: Pt did not understand why (with all of her lines) she could not get up on her own to go to the bathroom.  Poor safety awarenss and poor insight into deficits.            Pertinent Vitals/Pain Pain Assessment: 0-10 Pain Score: 8  Pain Location: incisional Pain Descriptors / Indicators: Grimacing;Guarding Pain Intervention(s): Limited activity within patient's tolerance;Monitored during session;Repositioned           PT Goals (current goals can now be found in the care plan section) Acute Rehab PT Goals Patient Stated Goal: to  decrease pain Progress towards PT goals: Progressing toward goals    Frequency    Min 3X/week      PT Plan Current plan remains appropriate       End of Session Equipment Utilized During  Treatment: Oxygen Activity Tolerance: Patient limited by fatigue;Patient limited by pain Patient left: in chair;with call bell/phone within reach;with chair alarm set     Time: 1027-2536 PT Time Calculation (min) (ACUTE ONLY): 22 min  Charges:  $Gait Training: 8-22 mins                     Rebecca B. Medendorp, PT, DPT [phone]   05/13/2016, 12:58 PM

## 2016-05-13 NOTE — Discharge Summary (Signed)
Physician Discharge Summary       [address]       [place] [address]             [phone]    Patient ID: [name] MRN: [id number] DOB/AGE: 1941/01/13 75 y.o.  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 Adenocarcinoma right upper lobe 2. Uterine cancer ([place]) 3. COPD (chronic obstructive pulmonary disease) ([place]) 4. Depression 5. GERD (gastroesophageal reflux disease) 6. Hyperlipdemia 7. Hypertension 8. Low back pain 9. Osteoarthritis 10. Osteoporosis 11. Anxiety 12. Tobacco abuse  Procedure (s):  Right video-assisted thoracoscopy, wedge resection right upper lobe nodule, thoracoscopic right upper lobectomy, On-Q local anesthetic catheter placement by Dr. Roxan Hockey on 05/09/2016.  Pathology:  Diagnosis 1. Lung, wedge biopsy/resection, Right Upper Lobe - INVASIVE ADENOCARCINOMA, WELL-DIFFERENTIATED, SPANNING 1.2 CM. - THE SURGICAL RESECTION MARGINS ARE NEGATIVE FOR CARCINOMA. - SEE ONCOLOGY TABLE BELOW. 2. Lung, resection (segmental or lobe), Right upper - BENIGN LUNG PARENCHYMA WITH EMPHYSEMATOUS CHANGES AND ANTHRACOTIC PIGMENT DEPOSITION. - THERE IS NO EVIDENCE OF CARCINOMA IN 10 OF 10 LYMPH NODES (0/10). 3. Lymph node, biopsy, L 9 - THERE IS NO EVIDENCE OF CARCINOMA IN 1 OF 1 LYMPH NODE (0/1). 4. Lymph node, biopsy, L 12 - THERE IS NO EVIDENCE OF CARCINOMA IN 1 OF 1 LYMPH NODE (0/1). 5. Lymph node, biopsy, L 10 - THERE IS NO EVIDENCE OF CARCINOMA IN 1 OF 1 LYMPH NODE (0/1). 6. Lymph node, biopsy, L 12 #2 - THERE IS NO EVIDENCE OF CARCINOMA IN 1 OF 1 LYMPH NODE (0/1). 7. Lymph node, biopsy, L 10 #2 - BENIGN LUNG PARENCHYMA WITH FIBROSIS AND SCATTERED LYMPHOID AGGREGATES. - THERE IS NO EVIDENCE OF MALIGNANCY.  History of Presenting Illness: This is a 75 year old woman with a 100-pack-year  history of tobacco abuse (quit 5 years ago), COPD, uterine cancer, hypertension, hyperlipidemia, osteoporosis, osteoarthritis, reflux, heartburn murmur, anxiety and depression. She recently saw [name] for follow-up of her COPD. Her symptoms were well controlled on her current bronchodilator regimen. She met criteria for low-dose CT scanning. The low-dose CT showed a right upper lobe nodule. A PET CT was done which showed the nodule was hypermetabolic. There was no evidence of regional or distant metastases.  She lives alone. She takes care of her own household. She has not been having to use a rescue inhaler recently. She does get short of breath with heavy exertion, but not during her routine activities. She denies any chest pain, pressure, or tightness. She says that she had a DVT of her left calf following hip surgery about 5 years ago. Her appetite is good, her weight is stable. She has occasional cough but no hemoptysis.   Dr. Roxan Hockey had a long discussion with [name] and reviewed the CT and PET CT images with her and her son. They discussed differential diagnosis. This most likely is a cancer, but other items in the differential included infectious or inflammatory nodules, particularly atypical infection such as mycobacterial or fungal. We discussed potential diagnostic and therapeutic approaches. They discussed possibly doing a CT-guided biopsy or bronchoscopic biopsy. Both of those are subject to the potential for false negative diagnoses. Given the high index of suspicion FeliciaAmiah Hernandez's recommendation is that we proceed with right VATS and wedge resection, to be followed by a right upper lobectomy the same setting if the frozen section is positive  for cancer. This will definitively diagnose and treat the nodule in one setting. Potential risks, benefits, and complications of the surgery were discussed with the patient and she agreed to proceed with surgery.  Brief Hospital Course:    A line and foley were removed early his post operative course. Chest tube output gradually decreased. Daily chest x rays were obtained and remained stable. She did have a small air leak so chest tubes remained to suction until 10/19. Posterior chest tube was removed on this date. Remaining chest tube was placed to water seal. This was removed on 10/21. Follow up chest x ray showed a trace apical pneumothorax. She did have an increase in her creatinine post op to 2.19. She was given extra fluid and nephrotoxic agents were avoided. Her last creatinine was down to 1.02. She is ambulating on 2 liters of oxygen via [place]. She may need home oxygen. She is tolerating a diet and has had a bowel movement. Her wound is clean dry. She is felt surgically stable for discharge today.   Latest Vital Signs: Blood pressure (!) 110/52, pulse 69, temperature 98.7 F (37.1 C), temperature source Oral, resp. rate 13, height '5\' 2"'  (1.575 m), weight 166 lb 14.2 oz (75.7 kg), SpO2 96 %.  Physical Exam: General appearance: alert, cooperative and no distress Neurologic: intact Heart: regular rate and rhythm Lungs: diminished breath sounds bibasilar and R>L Abdomen: normal findings: soft, non-tender no air leak  Discharge Condition: Stable and discharged to home.  Recent laboratory studies:  Lab Results  Component Value Date   WBC 8.2 05/13/2016   HGB 7.6 (L) 05/13/2016   HCT 22.8 (L) 05/13/2016   MCV 91.2 05/13/2016   PLT 193 05/13/2016   Lab Results  Component Value Date   NA 137 05/14/2016   K 3.6 05/14/2016   CL 108 05/14/2016   CO2 25 05/14/2016   CREATININE 0.97 05/14/2016   GLUCOSE 104 (H) 05/14/2016    Diagnostic Studies:   Dg Chest Port 1 View  Result Date: 05/13/2016 CLINICAL DATA:  Status post right lobectomy. EXAM: PORTABLE CHEST 1 VIEW COMPARISON:  Radiograph of May 12, 2016. FINDINGS: Stable cardiomediastinal silhouette. Left lung is clear. Postsurgical changes are again noted in the  right lung. Right internal jugular venous catheter is unchanged in position. Right-sided chest tube is unchanged position without evidence of pneumothorax. Mild subcutaneous emphysema is seen over right lateral chest wall. Right hilar surgical clips are noted IMPRESSION: Stable postsurgical changes seen in right chest. Right-sided chest tube is noted without evidence of pneumothorax. Electronically Signed   By: Marijo Conception, M.D.   On: 05/13/2016 08:45    Discharge Medications:   Medication List    TAKE these medications   albuterol 108 (90 Base) MCG/ACT inhaler Commonly known as:  PROVENTIL HFA;VENTOLIN HFA Inhale 2 puffs into the lungs every 6 (six) hours as needed. What changed:  when to take this  reasons to take this   albuterol (2.5 MG/3ML) 0.083% nebulizer solution Commonly known as:  PROVENTIL Take 3 mLs (2.5 mg total) by nebulization every 2 (two) hours as needed for wheezing or shortness of breath. What changed:  Another medication with the same name was changed. Make sure you understand how and when to take each.   amLODipine 2.5 MG tablet Commonly known as:  NORVASC Take 5 mg by mouth at bedtime.   atorvastatin 20 MG tablet Commonly known as:  LIPITOR Take 20 mg by mouth daily.   diazepam 5  MG tablet Commonly known as:  VALIUM Take 5 mg by mouth at bedtime as needed for anxiety (for nerves).   Fluticasone-Salmeterol 250-50 MCG/DOSE Aepb Commonly known as:  ADVAIR DISKUS Inhale 1 puff into the lungs every 12 (twelve) hours.   gabapentin 100 MG capsule Commonly known as:  NEURONTIN Take 200 mg by mouth every morning.   Ginkgo Biloba 120 MG Caps Take 120 mg by mouth daily.   GLUCOSAMINE PO Take 1 tablet by mouth daily.   guaiFENesin-dextromethorphan 100-10 MG/5ML syrup Commonly known as:  ROBITUSSIN DM Take 5 mLs by mouth every 4 (four) hours as needed for cough.   HYDROcodone-acetaminophen 5-325 MG tablet Commonly known as:  NORCO/VICODIN Take 1  tablet by mouth every 4 (four) hours as needed for moderate pain. What changed:  when to take this   lisinopril 30 MG tablet Commonly known as:  PRINIVIL,ZESTRIL Take 30 mg by mouth every morning.   Magnesium 250 MG Tabs Take 250 mg by mouth daily.   metoCLOPramide 5 MG tablet Commonly known as:  REGLAN Take 5 mg by mouth 2 (two) times daily.   pantoprazole 40 MG tablet Commonly known as:  PROTONIX Take 1 tablet (40 mg total) by mouth daily at 6 (six) AM.   raloxifene 60 MG tablet Commonly known as:  EVISTA Take 60 mg by mouth daily.   sertraline 100 MG tablet Commonly known as:  ZOLOFT Take 100 mg by mouth daily.   tiotropium 18 MCG inhalation capsule Commonly known as:  SPIRIVA Place 18 mcg into inhaler and inhale daily.       Follow Up Appointments: Follow-up Information    [place] .   Why:  Home Health RN Contact information: [place] [address] [phone]        [name], MD Follow up on 05/31/2016.   Specialty:  Cardiothoracic Surgery Why:  PA/LAT CXR to be taken (at [facility] which is in the same building as Dr. Leonarda Salon office) on 05/31/2016 at 9:00 am;Appointment time is 9:30 am Contact information: [place] [address] [phone]           Signed: Cinda Quest 05/16/2016, 8:49 AM

## 2016-05-13 NOTE — Progress Notes (Signed)
4 Days Post-Op Procedure(s) (LRB): VIDEO ASSISTED THORACOSCOPY (VATS)/WEDGE RESECTION (Right) RIGHT UPPER LOBECTOMY (Right) Subjective: Feels better today. Less pain, nausea improved Appetite not good  Objective: Vital signs in last 24 hours: Temp:  [97.8 F (36.6 C)-100 F (37.8 C)] 98.9 F (37.2 C) (10/20 0400) Pulse Rate:  [78-92] 78 (10/20 0400) Cardiac Rhythm: Normal sinus rhythm (10/20 0400) Resp:  [14-17] 14 (10/20 0400) BP: (117-140)/(59-67) 140/67 (10/20 0400) SpO2:  [91 %-99 %] 98 % (10/20 0400)  Hemodynamic parameters for last 24 hours:    Intake/Output from previous day: 10/19 0701 - 10/20 0700 In: 1463.3 [P.O.:240; I.V.:1193.3] Out: 1075 [Urine:925; Chest Tube:150] Intake/Output this shift: No intake/output data recorded.  General appearance: alert, cooperative and no distress Neurologic: intact Heart: regular rate and rhythm Lungs: diminished breath sounds bibasilar and R>L Abdomen: normal findings: soft, non-tender no air leak  Lab Results:  Recent Labs  05/12/16 0406 05/13/16 0409  WBC 9.8 8.2  HGB 7.8* 7.6*  HCT 23.4* 22.8*  PLT 185 193   BMET:  Recent Labs  05/12/16 0406 05/13/16 0409  NA 136 137  K 3.3* 3.4*  CL 107 107  CO2 24 26  GLUCOSE 115* 106*  BUN 14 13  CREATININE 1.39* 1.02*  CALCIUM 7.6* 7.9*    PT/INR: No results for input(s): LABPROT, INR in the last 72 hours. ABG    Component Value Date/Time   PHART 7.324 (L) 05/09/2016 1217   HCO3 26.5 05/09/2016 1217   TCO2 28 05/09/2016 1217   ACIDBASEDEF 0.3 05/05/2016 0955   O2SAT 99.0 05/09/2016 1217   CBG (last 3)   Recent Labs  05/12/16 1247 05/12/16 1705 05/12/16 2224  GLUCAP 109* 115* 104*    Assessment/Plan: S/P Procedure(s) (LRB): VIDEO ASSISTED THORACOSCOPY (VATS)/WEDGE RESECTION (Right) RIGHT UPPER LOBECTOMY (Right) -  CV- stable  RESP- air leak has resolved, CT to water seal- dc tomorrow  Continue inhalers, change nebs to PRN  RENAL- creatinine  back to normal, hypokalemia- supplement K  ENDO CBG well controlled  GI- nausea improved, PO intake still relatively low  Anemia- secondary to ABL- stable, follow  Continue to mobilize  SCD + enoxaparin for DVT prophylaxis  Path still pending   LOS: 4 days    [name] 05/13/2016

## 2016-05-14 ENCOUNTER — Inpatient Hospital Stay (HOSPITAL_COMMUNITY): Payer: Commercial Managed Care - HMO

## 2016-05-14 LAB — BASIC METABOLIC PANEL
Anion gap: 4 — ABNORMAL LOW (ref 5–15)
BUN: 12 mg/dL (ref 6–20)
CO2: 25 mmol/L (ref 22–32)
Calcium: 8.1 mg/dL — ABNORMAL LOW (ref 8.9–10.3)
Chloride: 108 mmol/L (ref 101–111)
Creatinine, Ser: 0.97 mg/dL (ref 0.44–1.00)
GFR calc Af Amer: >60 mL/min (ref >60)
GFR calc non Af Amer: 57 mL/min — ABNORMAL LOW (ref >60)
Glucose, Bld: 104 mg/dL — ABNORMAL HIGH (ref 65–99)
Potassium: 3.6 mmol/L (ref 3.5–5.1)
Sodium: 137 mmol/L (ref 135–145)

## 2016-05-14 LAB — GLUCOSE, CAPILLARY
Glucose-Capillary: 103 mg/dL — ABNORMAL HIGH (ref 65–99)
Glucose-Capillary: 96 mg/dL (ref 65–99)
Glucose-Capillary: 109 mg/dL — ABNORMAL HIGH (ref 65–99)

## 2016-05-14 IMAGING — DX DG CHEST 1V PORT
1 series · 1 of 1 positions shown · non-contrast
Comparison: [DATE] chest x-ray.

CLINICAL DATA: 75-year-old female post VATS.  Subsequent encounter.

EXAM:
PORTABLE CHEST 1 VIEW

[chest ap]
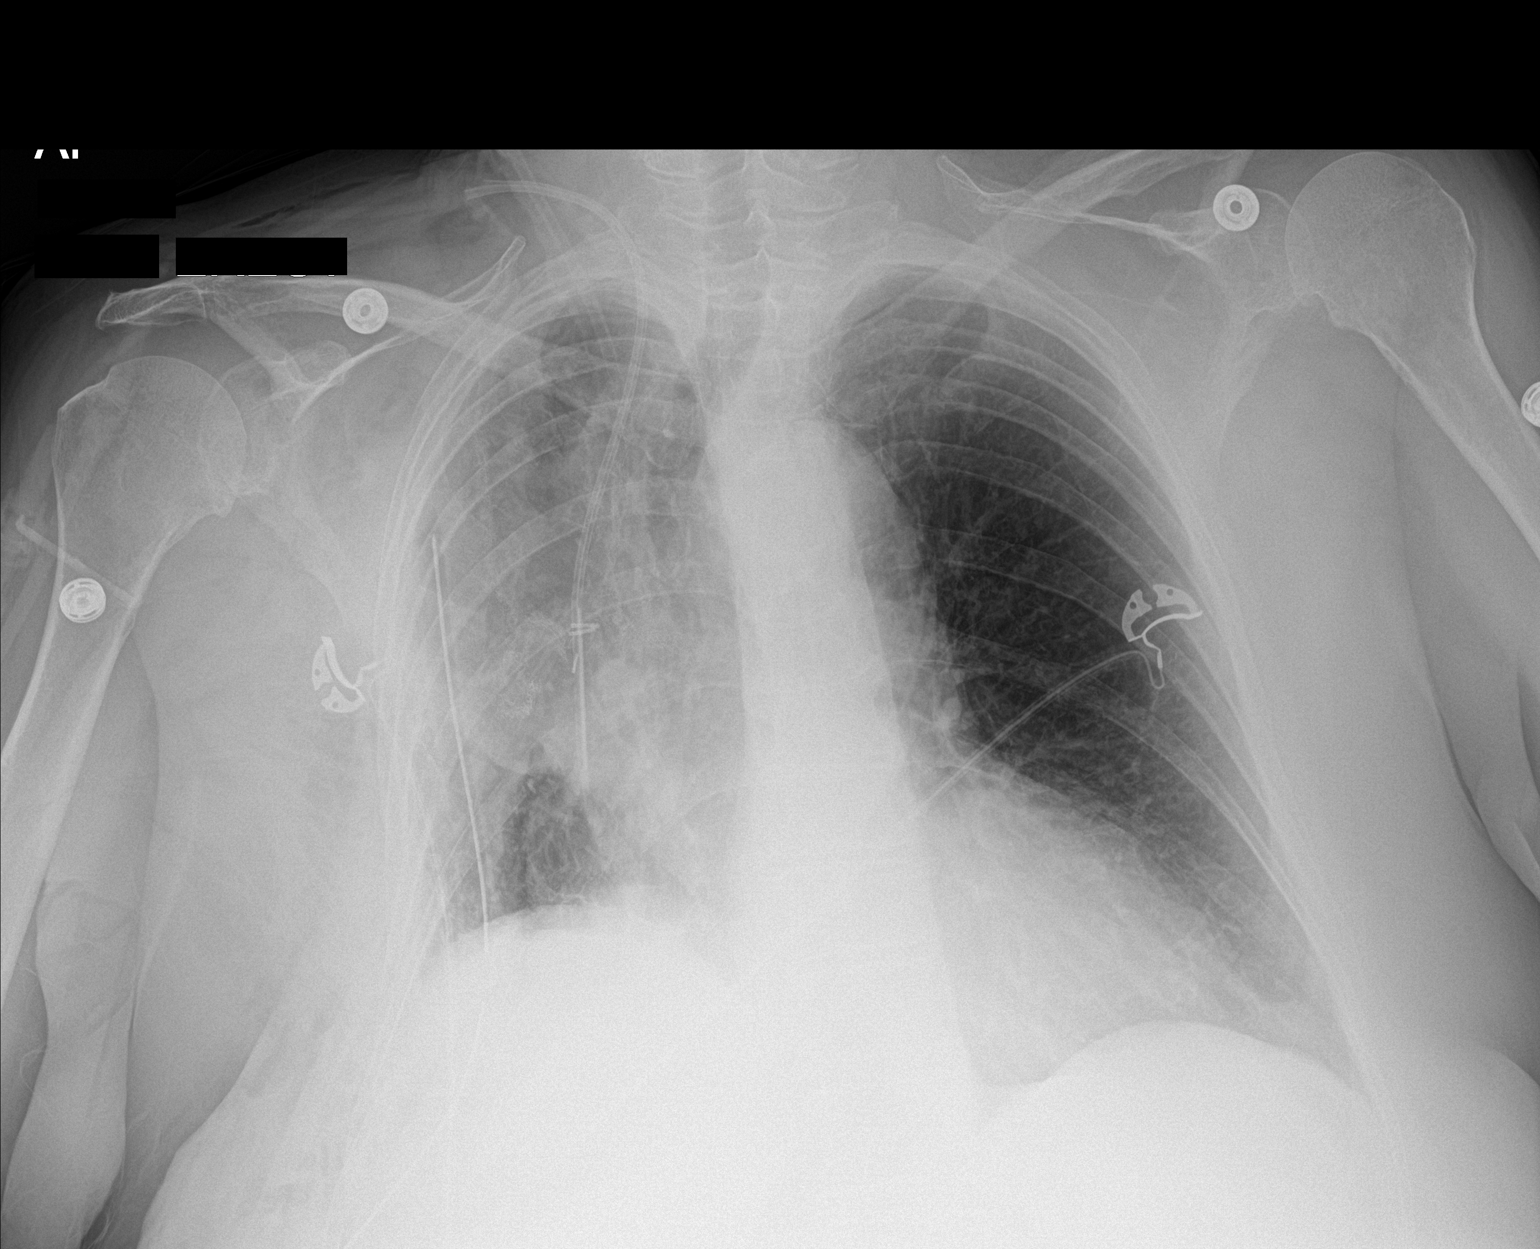

[1 of 1 positions shown; findings below may reference images not displayed]

FINDINGS: Post right VATS with mild consolidation adjacent to surgical clips
which may represent postoperative changes/ mild hemorrhage.

Elevated right hemidiaphragm.

Lucency right lung base unchanged. No definitive pneumothorax
although anterior right pneumothorax would be difficult to
completely exclude on the current exam. Right chest tube remains in
place.

Mild right perihilar pulmonary vascular congestion.

Heart slightly enlarged.  Slightly tortuous aorta.
IMPRESSION: Post right VATS with mild consolidation adjacent to surgical clips
which may represent postoperative changes/ mild hemorrhage.

Lucency right lung base unchanged. No definitive pneumothorax
although anterior right pneumothorax would be difficult to
completely exclude on the current exam.

## 2016-05-14 MED ORDER — GABAPENTIN 100 MG PO CAPS
200.0000 mg | ORAL_CAPSULE | Freq: Every day | ORAL | Status: DC
  Administered 2016-05-14 – 2016-05-16 (×3): 200 mg via ORAL
  Filled 2016-05-14 (×3): qty 2

## 2016-05-14 NOTE — NC FL2 (Signed)
[place] MEDICAID FL2 LEVEL OF CARE SCREENING TOOL     IDENTIFICATION  Patient Name: [name] Birthdate: February 05, 1941 Sex: female Admission Date (Current Location): 05/09/2016  [facility] and [place] Number:  Herbalist and Address:  [facility], [place] [address] [place], [place] [address]      Provider Number: [id number]  Attending Physician Name and Address:  [name], MD  Relative Name and Phone Number:       Current Level of Care:   Recommended Level of Care: [place] Prior Approval Number:    Date Approved/Denied:   PASRR Number:   [id number]   Discharge Plan: SNF    Current Diagnoses: Patient Active Problem List   Diagnosis Date Noted  . S/P lobectomy of lung 05/09/2016  . DVT (deep venous thrombosis) ([place]) 04/26/2016  . Heart murmur   . COPD exacerbation ([place]) 08/29/2015  . HTN (hypertension) 08/29/2015  . HLD (hyperlipidemia) 08/29/2015  . Chronic low back pain 08/29/2015  . CKD (chronic kidney disease), stage III 08/29/2015  . COPD (chronic obstructive pulmonary disease) ([place]) 04/27/2015  . Acute on chronic respiratory failure with hypoxia ([place]) 04/27/2015  . Infected sebaceous cyst 08/16/2011    Orientation RESPIRATION BLADDER Height & Weight     Self, Time, Situation, Place  Normal Incontinent Weight: 166 lb 14.2 oz (75.7 kg) Height:  '5\' 2"'$  (157.5 cm)  BEHAVIORAL SYMPTOMS/MOOD NEUROLOGICAL BOWEL NUTRITION STATUS   (none)  (none) Continent  (Heart)  AMBULATORY STATUS COMMUNICATION OF NEEDS Skin   Extensive Assist Verbally Surgical wounds                       Personal Care Assistance Level of Assistance  Bathing, Feeding, Dressing Bathing Assistance: Limited assistance Feeding assistance: Independent Dressing Assistance: Limited assistance     Functional Limitations Info  Sight, Hearing, Speech Sight Info: Adequate Hearing Info: Adequate Speech Info: Adequate     SPECIAL CARE FACTORS FREQUENCY  PT (By licensed PT)     PT Frequency:  (5x/week)              Contractures Contractures Info: Not present    Additional Factors Info  Code Status, Allergies Code Status Info:  (full) Allergies Info:  (Boniva Ibandronate Sodium, Nitrofurantoin Monohyd Macro)           Current Medications (05/14/2016):  This is the current hospital active medication list Current Facility-Administered Medications  Medication Dose Route Frequency Provider Last Rate Last Dose  . acetaminophen (TYLENOL) tablet 1,000 mg  1,000 mg Oral Q6H Elgie Collard, PA-C   1,000 mg at 05/14/16 1126   Or  . acetaminophen (TYLENOL) solution 1,000 mg  1,000 mg Oral Q6H Tessa N Conte, PA-C      . albuterol (PROVENTIL) (2.5 MG/3ML) 0.083% nebulizer solution 2.5 mg  2.5 mg Nebulization Q6H PRN [name], MD      . atorvastatin (LIPITOR) tablet 20 mg  20 mg Oral q1800 Nani Skillern, PA-C   20 mg at 05/13/16 1857  . bisacodyl (DULCOLAX) EC tablet 10 mg  10 mg Oral Daily Elgie Collard, PA-C   10 mg at 05/12/16 1751  . dextrose 5 %-0.45 % sodium chloride infusion   Intravenous Continuous [name], MD   Stopped at 05/14/16 1255  . diazepam (VALIUM) tablet 5 mg  5 mg Oral QHS PRN [name], MD      . enoxaparin (LOVENOX)  injection 30 mg  30 mg Subcutaneous Q24H [name], MD   30 mg at 05/14/16 1002  . fentaNYL (SUBLIMAZE) injection 25-50 mcg  25-50 mcg Intravenous Q2H PRN Elgie Collard, PA-C      . gabapentin (NEURONTIN) capsule 200 mg  200 mg Oral Daily [name], MD   200 mg at 05/14/16 1001  . guaiFENesin (MUCINEX) 12 hr tablet 600 mg  600 mg Oral BID [name], MD   600 mg at 05/14/16 1002  . insulin aspart (novoLOG) injection 0-9 Units  0-9 Units Subcutaneous TID WC [name], MD   2 Units at 05/13/16 1857  . MEDLINE mouth rinse  15 mL Mouth Rinse BID [name], MD   15 mL at 05/13/16 2200   . metoCLOPramide (REGLAN) injection 10 mg  10 mg Intravenous Q6H Elgie Collard, PA-C   10 mg at 05/14/16 1127  . mometasone-formoterol (DULERA) 200-5 MCG/ACT inhaler 2 puff  2 puff Inhalation BID Elgie Collard, PA-C   2 puff at 05/14/16 0909  . ondansetron (ZOFRAN) injection 4 mg  4 mg Intravenous Q6H PRN Elgie Collard, PA-C      . oxyCODONE (Oxy IR/ROXICODONE) immediate release tablet 5-10 mg  5-10 mg Oral Q4H PRN Elgie Collard, PA-C   10 mg at 05/14/16 1253  . pantoprazole (PROTONIX) EC tablet 40 mg  40 mg Oral Daily Donielle Liston Alba, PA-C   40 mg at 05/14/16 1001  . potassium chloride 10 mEq in 50 mL *CENTRAL LINE* IVPB  10 mEq Intravenous Daily PRN Elgie Collard, PA-C   10 mEq at 05/14/16 1117  . raloxifene (EVISTA) tablet 60 mg  60 mg Oral Daily Donielle Liston Alba, PA-C   60 mg at 05/14/16 1002  . senna-docusate (Senokot-S) tablet 1 tablet  1 tablet Oral QHS Elgie Collard, PA-C   1 tablet at 05/13/16 2258  . sertraline (ZOLOFT) tablet 100 mg  100 mg Oral Daily Elgie Collard, PA-C   100 mg at 05/14/16 1001  . tiotropium (SPIRIVA) inhalation capsule 18 mcg  18 mcg Inhalation Daily Elgie Collard, PA-C   18 mcg at 05/14/16 0908  . traMADol (ULTRAM) tablet 50 mg  50 mg Oral Q6H PRN Nani Skillern, PA-C   50 mg at 05/10/16 1118     Discharge Medications: Please see discharge summary for a list of discharge medications.  Relevant Imaging Results:  Relevant Lab Results:   Additional Information SS#: [id number]  Junie Spencer, LCSW

## 2016-05-14 NOTE — Progress Notes (Addendum)
      [address]       [place] [address]             [phone]      5 Days Post-Op Procedure(s) (LRB): VIDEO ASSISTED THORACOSCOPY (VATS)/WEDGE RESECTION (Right) RIGHT UPPER LOBECTOMY (Right)   Subjective:  Complains of pain at chest tube site.  Wants her "nerve" pill.  Appetite better.  Objective: Vital signs in last 24 hours: Temp:  [97.1 F (36.2 C)-98.7 F (37.1 C)] 97.1 F (36.2 C) (10/21 0717) Pulse Rate:  [66-83] 76 (10/21 1122) Cardiac Rhythm: Normal sinus rhythm (10/21 1122) Resp:  [15-23] 19 (10/21 1122) BP: (127-152)/(70-91) 149/91 (10/21 1122) SpO2:  [94 %-99 %] 96 % (10/21 1122) FiO2 (%):  [96 %-98 %] 98 % (10/21 0000)  Intake/Output from previous day: 10/20 0701 - 10/21 0700 In: 1275 [P.O.:840; I.V.:418; IV Piggyback:200] Out: 300 [Chest Tube:300] Intake/Output this shift: Total I/O In: 540 [P.O.:360; I.V.:80; IV Piggyback:100] Out: 100 [Chest Tube:100]  General appearance: alert, cooperative and no distress Heart: regular rate and rhythm Lungs: diminished breath sounds bibasilar Abdomen: soft, non-tender; bowel sounds normal; no masses,  no organomegaly Extremities: extremities normal, atraumatic, no cyanosis or edema Wound: clean and dry  Lab Results:  Recent Labs  05/12/16 0406 05/13/16 0409  WBC 9.8 8.2  HGB 7.8* 7.6*  HCT 23.4* 22.8*  PLT 185 193   BMET:  Recent Labs  05/13/16 0409 05/14/16 0500  NA 137 137  K 3.4* 3.6  CL 107 108  CO2 26 25  GLUCOSE 106* 104*  BUN 13 12  CREATININE 1.02* 0.97  CALCIUM 7.9* 8.1*    PT/INR: No results for input(s): LABPROT, INR in the last 72 hours. ABG    Component Value Date/Time   PHART 7.324 (L) 05/09/2016 1217   HCO3 26.5 05/09/2016 1217   TCO2 28 05/09/2016 1217   ACIDBASEDEF 0.3 05/05/2016 0955   O2SAT 99.0 05/09/2016 1217   CBG (last 3)   Recent Labs  05/13/16 1734 05/13/16 2106 05/14/16 0814  GLUCAP 182* 91 103*    Assessment/Plan: S/P  Procedure(s) (LRB): VIDEO ASSISTED THORACOSCOPY (VATS)/WEDGE RESECTION (Right) RIGHT UPPER LOBECTOMY (Right)  1. Chest tube- no air leak,  200 cc output- CXR stable in appearance... Will d/c chest tube today 2. Pulm- no acute issues, continue aggressive pulm toilet 3. Gi- appetite improving, continue to encourage oral intake 4. Renal- remains hypokalemic but improving, K runs being given 5. Dispo- patient stable, d/c chest tube, repeat CXR in AM... Supplement K   LOS: 5 days    BARRETT, [name] 05/14/2016  I have seen and examined the patient and agree with the assessment and plan as outlined.  D/C chest tube.  Patient may need 2 or 3 more days before she will be ready for D/C.  She is very anxious and her mobility has not progressed much as of yet.  Rexene Alberts, MD 05/14/2016 1:49 PM

## 2016-05-15 ENCOUNTER — Inpatient Hospital Stay (HOSPITAL_COMMUNITY): Payer: Commercial Managed Care - HMO

## 2016-05-15 LAB — GLUCOSE, CAPILLARY
Glucose-Capillary: 90 mg/dL (ref 65–99)
Glucose-Capillary: 106 mg/dL — ABNORMAL HIGH (ref 65–99)
Glucose-Capillary: 115 mg/dL — ABNORMAL HIGH (ref 65–99)
Glucose-Capillary: 123 mg/dL — ABNORMAL HIGH (ref 65–99)
Glucose-Capillary: 99 mg/dL (ref 65–99)

## 2016-05-15 IMAGING — DX DG CHEST 2V
2 series · 2 of 2 positions shown · non-contrast
Comparison: [DATE]

CLINICAL DATA: Status post right upper lobectomy

EXAM:
CHEST  2 VIEW

[chest pa]
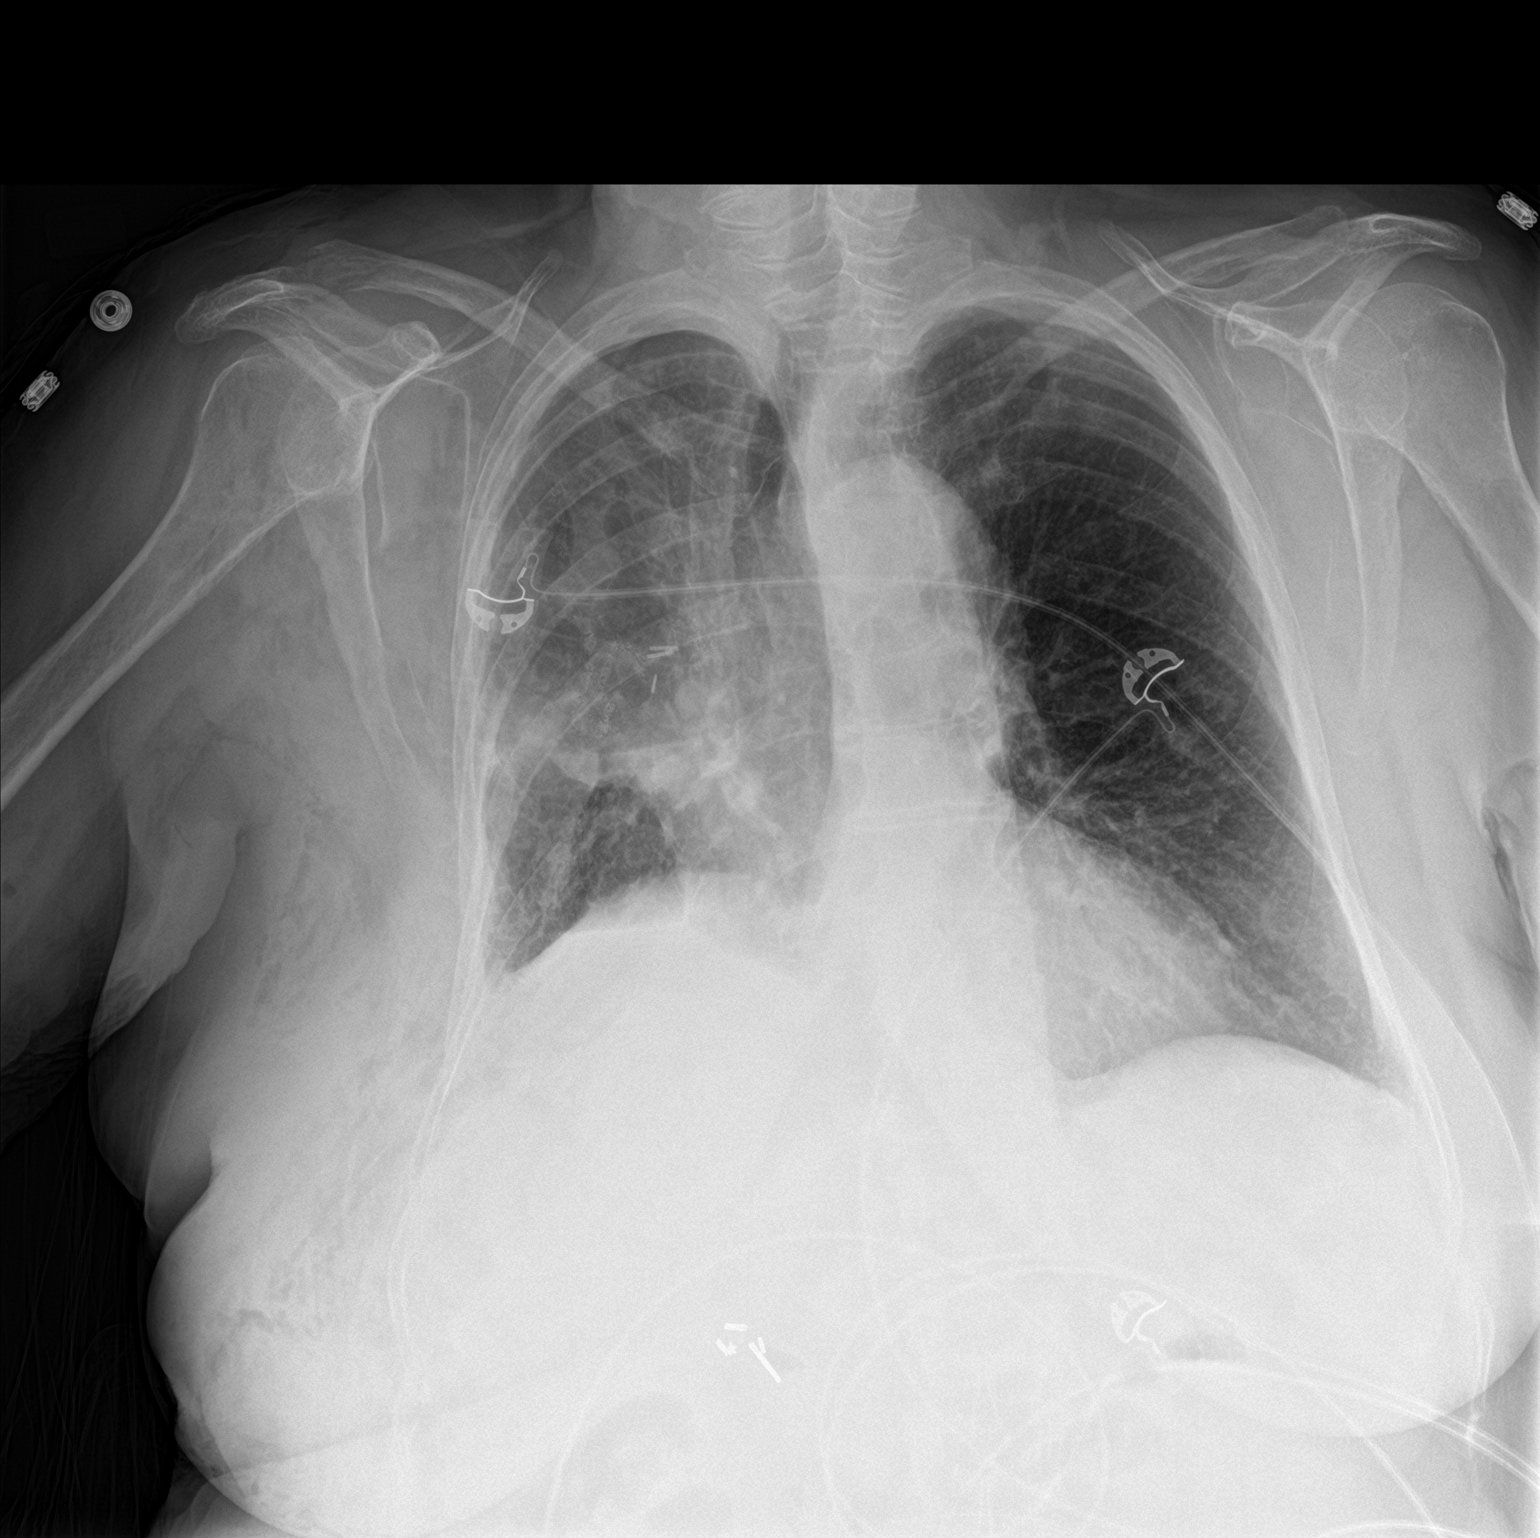

[chest lat]
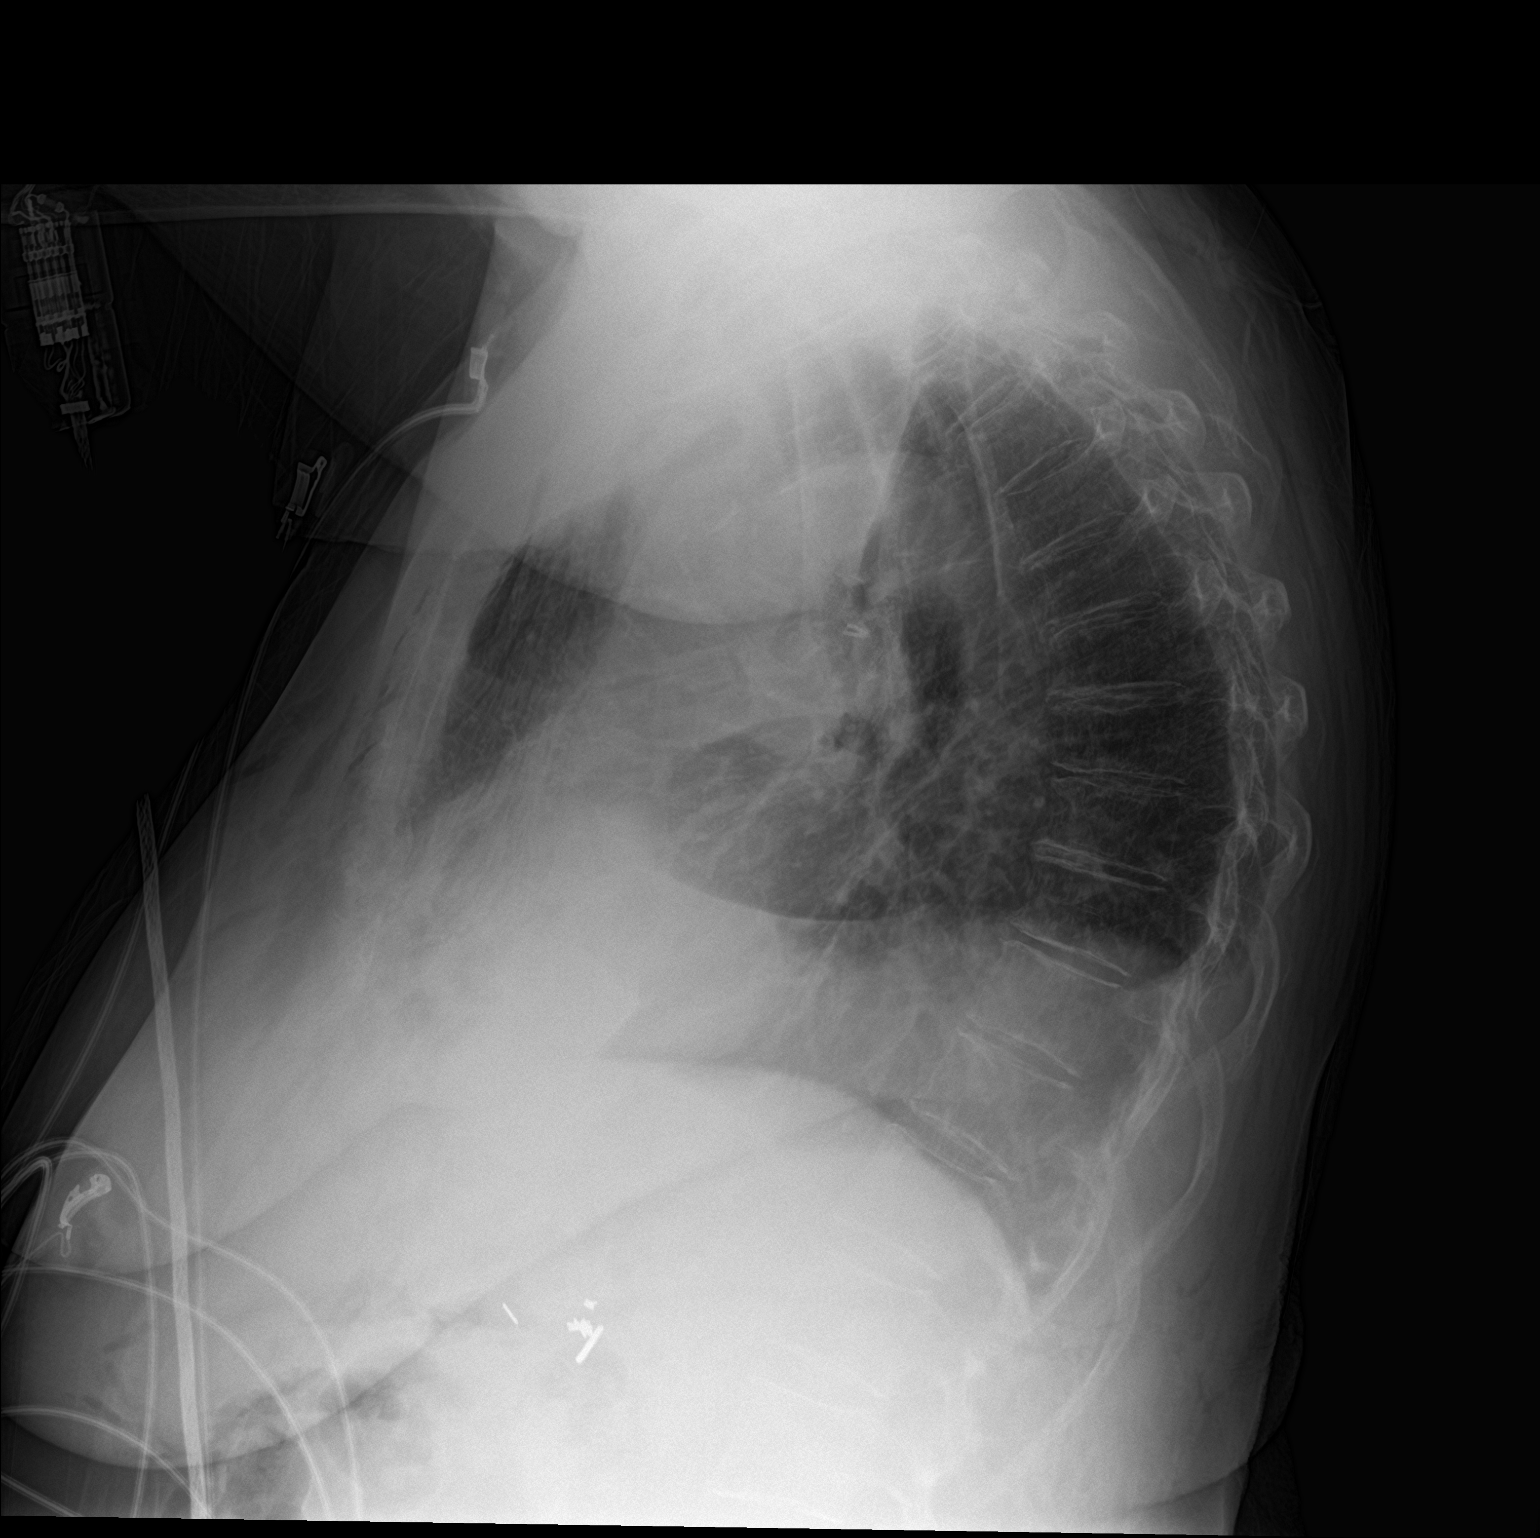

[2 of 2 positions shown; findings below may reference images not displayed]

FINDINGS: Cardiac shadow is stable. Postoperative changes are again seen.
Subcutaneous emphysema is noted related to the recent surgery. Chest
tube removal has been performed on the right. No recurrent
pneumothorax is seen. Stable atelectatic changes are noted in the
right mid lung.
IMPRESSION: No pneumothorax following chest tube removal.

## 2016-05-15 MED ORDER — AMLODIPINE BESYLATE 5 MG PO TABS
5.0000 mg | ORAL_TABLET | Freq: Every day | ORAL | Status: DC
  Administered 2016-05-15 – 2016-05-16 (×2): 5 mg via ORAL
  Filled 2016-05-15 (×2): qty 1

## 2016-05-15 MED ORDER — LISINOPRIL 20 MG PO TABS
20.0000 mg | ORAL_TABLET | Freq: Every day | ORAL | Status: DC
  Administered 2016-05-15 – 2016-05-16 (×2): 20 mg via ORAL
  Filled 2016-05-15 (×2): qty 1

## 2016-05-15 NOTE — Progress Notes (Addendum)
      [address]       [place] [address]             [phone]      6 Days Post-Op Procedure(s) (LRB): VIDEO ASSISTED THORACOSCOPY (VATS)/WEDGE RESECTION (Right) RIGHT UPPER LOBECTOMY (Right)   Subjective:  Continues to have pain at chest tube site.  Patient wants to go home, states she has several children who will be helping her.  + ambulation  + BM  Objective: Vital signs in last 24 hours: Temp:  [98.1 F (36.7 C)-99.6 F (37.6 C)] 98.1 F (36.7 C) (10/22 0632) Pulse Rate:  [72-80] 74 (10/22 0632) Cardiac Rhythm: Normal sinus rhythm (10/22 0806) Resp:  [18-23] 18 (10/22 0632) BP: (149-170)/(64-91) 152/71 (10/22 0632) SpO2:  [95 %-98 %] 98 % (10/22 0900)  Intake/Output from previous day: 10/21 0701 - 10/22 0700 In: 540 [P.O.:360; I.V.:80; IV Piggyback:100] Out: 100 [Chest Tube:100] Intake/Output this shift: Total I/O In: 240 [P.O.:240] Out: -   General appearance: alert, cooperative and no distress Heart: regular rate and rhythm Lungs: clear to auscultation bilaterally Abdomen: soft, non-tender; bowel sounds normal; no masses,  no organomegaly Wound: clean andd ry  Lab Results:  Recent Labs  05/13/16 0409  WBC 8.2  HGB 7.6*  HCT 22.8*  PLT 193   BMET:  Recent Labs  05/13/16 0409 05/14/16 0500  NA 137 137  K 3.4* 3.6  CL 107 108  CO2 26 25  GLUCOSE 106* 104*  BUN 13 12  CREATININE 1.02* 0.97  CALCIUM 7.9* 8.1*    PT/INR: No results for input(s): LABPROT, INR in the last 72 hours. ABG    Component Value Date/Time   PHART 7.324 (L) 05/09/2016 1217   HCO3 26.5 05/09/2016 1217   TCO2 28 05/09/2016 1217   ACIDBASEDEF 0.3 05/05/2016 0955   O2SAT 99.0 05/09/2016 1217   CBG (last 3)   Recent Labs  05/14/16 1227 05/14/16 2134 05/15/16 0812  GLUCAP 96 109* 90    Assessment/Plan: S/P Procedure(s) (LRB): VIDEO ASSISTED THORACOSCOPY (VATS)/WEDGE RESECTION (Right) RIGHT UPPER LOBECTOMY (Right)  1. Pulm- no acute  issues, off oxygen, CXR is free from pneumothorax post chest tube removal 2. CV- NSR, + HTN- will resume home antihypertensives 3. DM- sugars okay 4. Deconditioning- patient is declining SNF placement, she has help at home.  She is ambulating with nursing with minimal difficulty 5. Dispo- patient stable, had episode of wheezing this morning, this has resolved, will start antihypertensives.... Patient is agreeable to stay one more day... Will repeat CXR in AM   LOS: 6 days    BARRETT, [name] 05/15/2016  I have seen and examined the patient and agree with the assessment and plan as outlined.  Anticipate possible d/c home tomorrow.  Rexene Alberts, MD 05/15/2016 12:25 PM

## 2016-05-15 NOTE — Progress Notes (Signed)
Pt returned to the unit via whee chair from [place]. Pt was having mild respiratory distress.  audible wheezing. Pox 90 on RA. Lung sound with ausculation on the right side with cracking. On palpation there was posteriorly  small area  of crepitus. Dr. Roxy Manns was paged and responded no orders given at this time. Will continue to monitor pt.

## 2016-05-16 ENCOUNTER — Inpatient Hospital Stay (HOSPITAL_COMMUNITY): Payer: Commercial Managed Care - HMO

## 2016-05-16 ENCOUNTER — Other Ambulatory Visit: Payer: Self-pay

## 2016-05-16 DIAGNOSIS — J441 Chronic obstructive pulmonary disease with (acute) exacerbation: Secondary | ICD-10-CM

## 2016-05-16 LAB — GLUCOSE, CAPILLARY
Glucose-Capillary: 102 mg/dL — ABNORMAL HIGH (ref 65–99)
Glucose-Capillary: 116 mg/dL — ABNORMAL HIGH (ref 65–99)

## 2016-05-16 IMAGING — DX DG CHEST 2V
2 series · 2 of 2 positions shown · non-contrast
Comparison: [DATE] .  [DATE].

CLINICAL DATA: Shortness of breath.  Right-sided chest pain.

EXAM:
CHEST  2 VIEW

[chest pa]
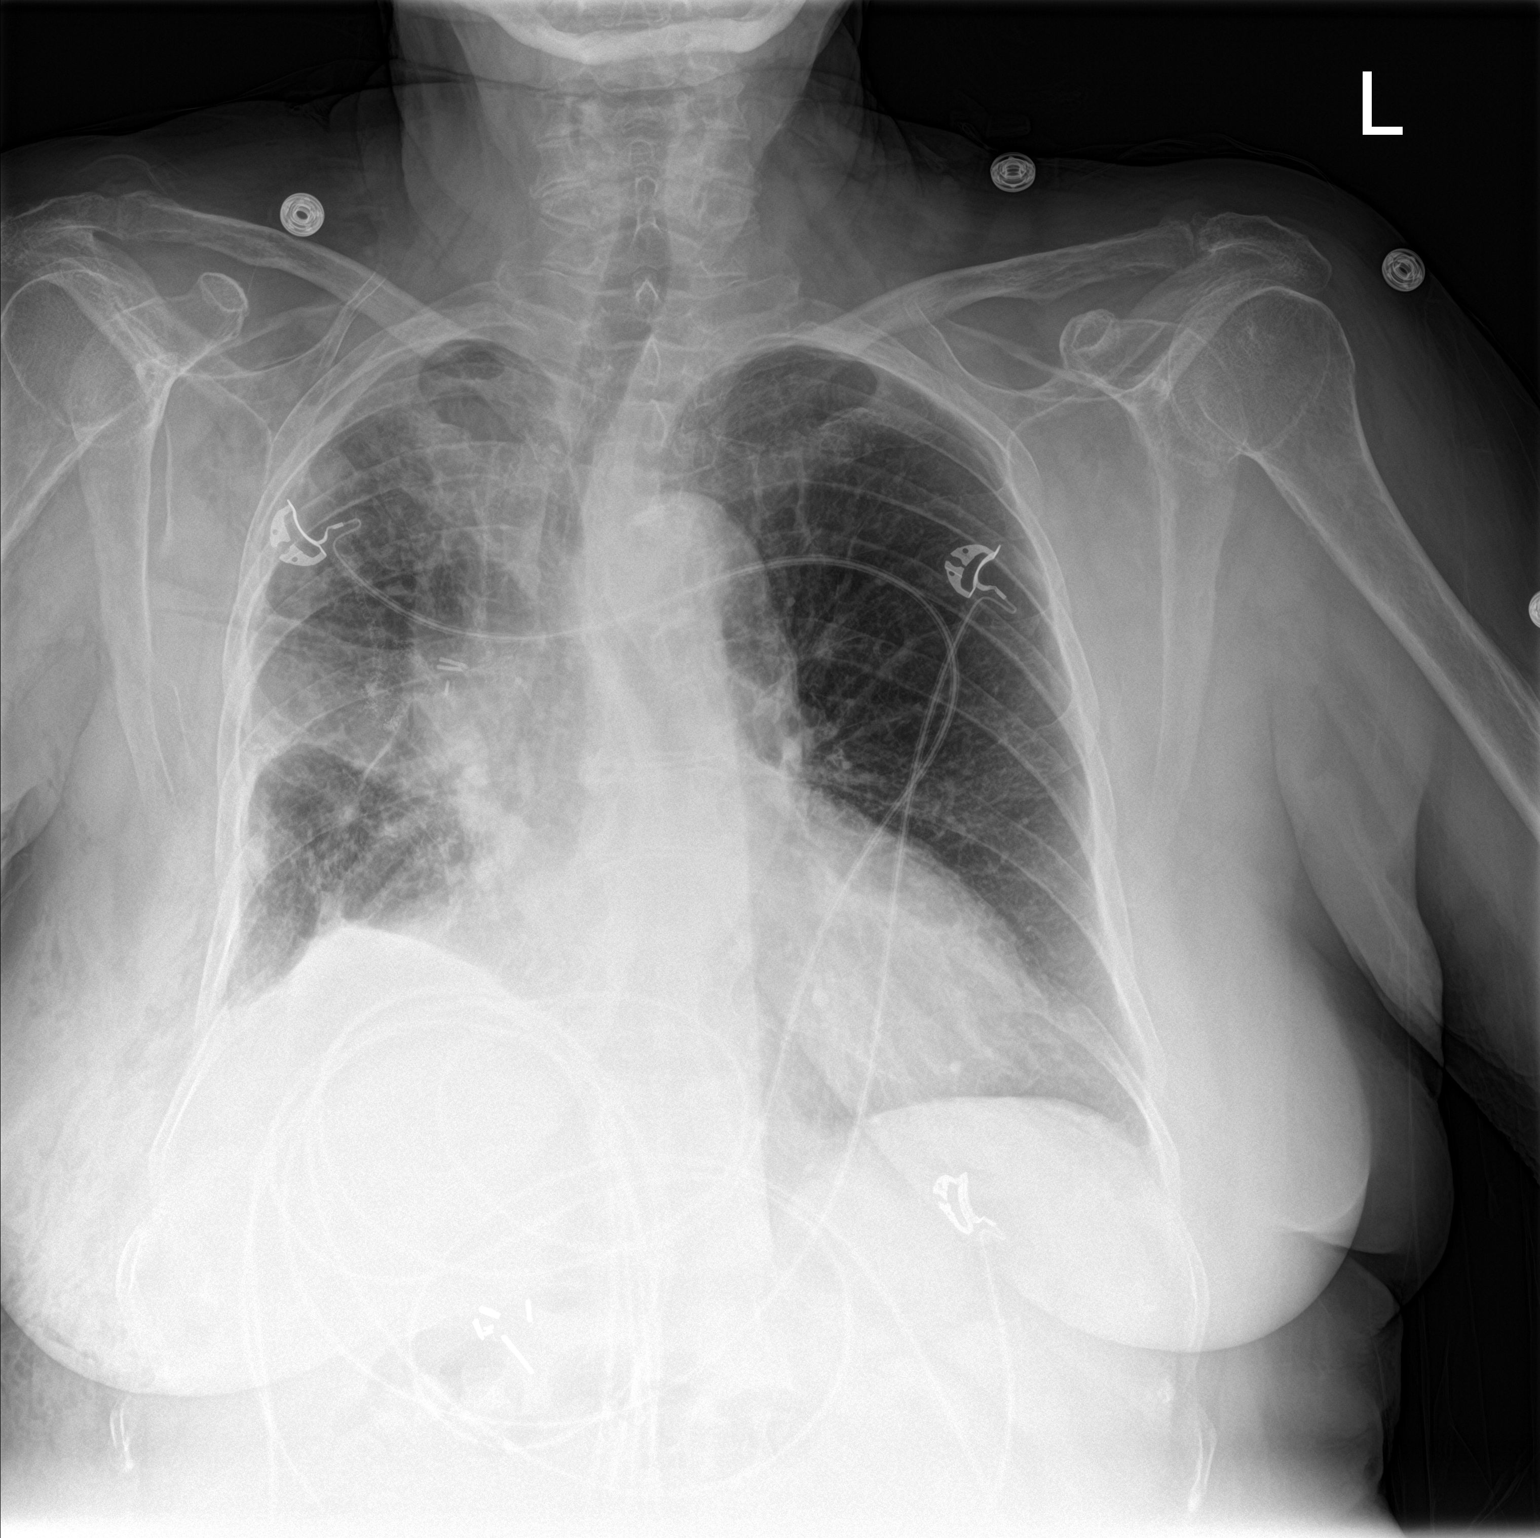

[chest lat]
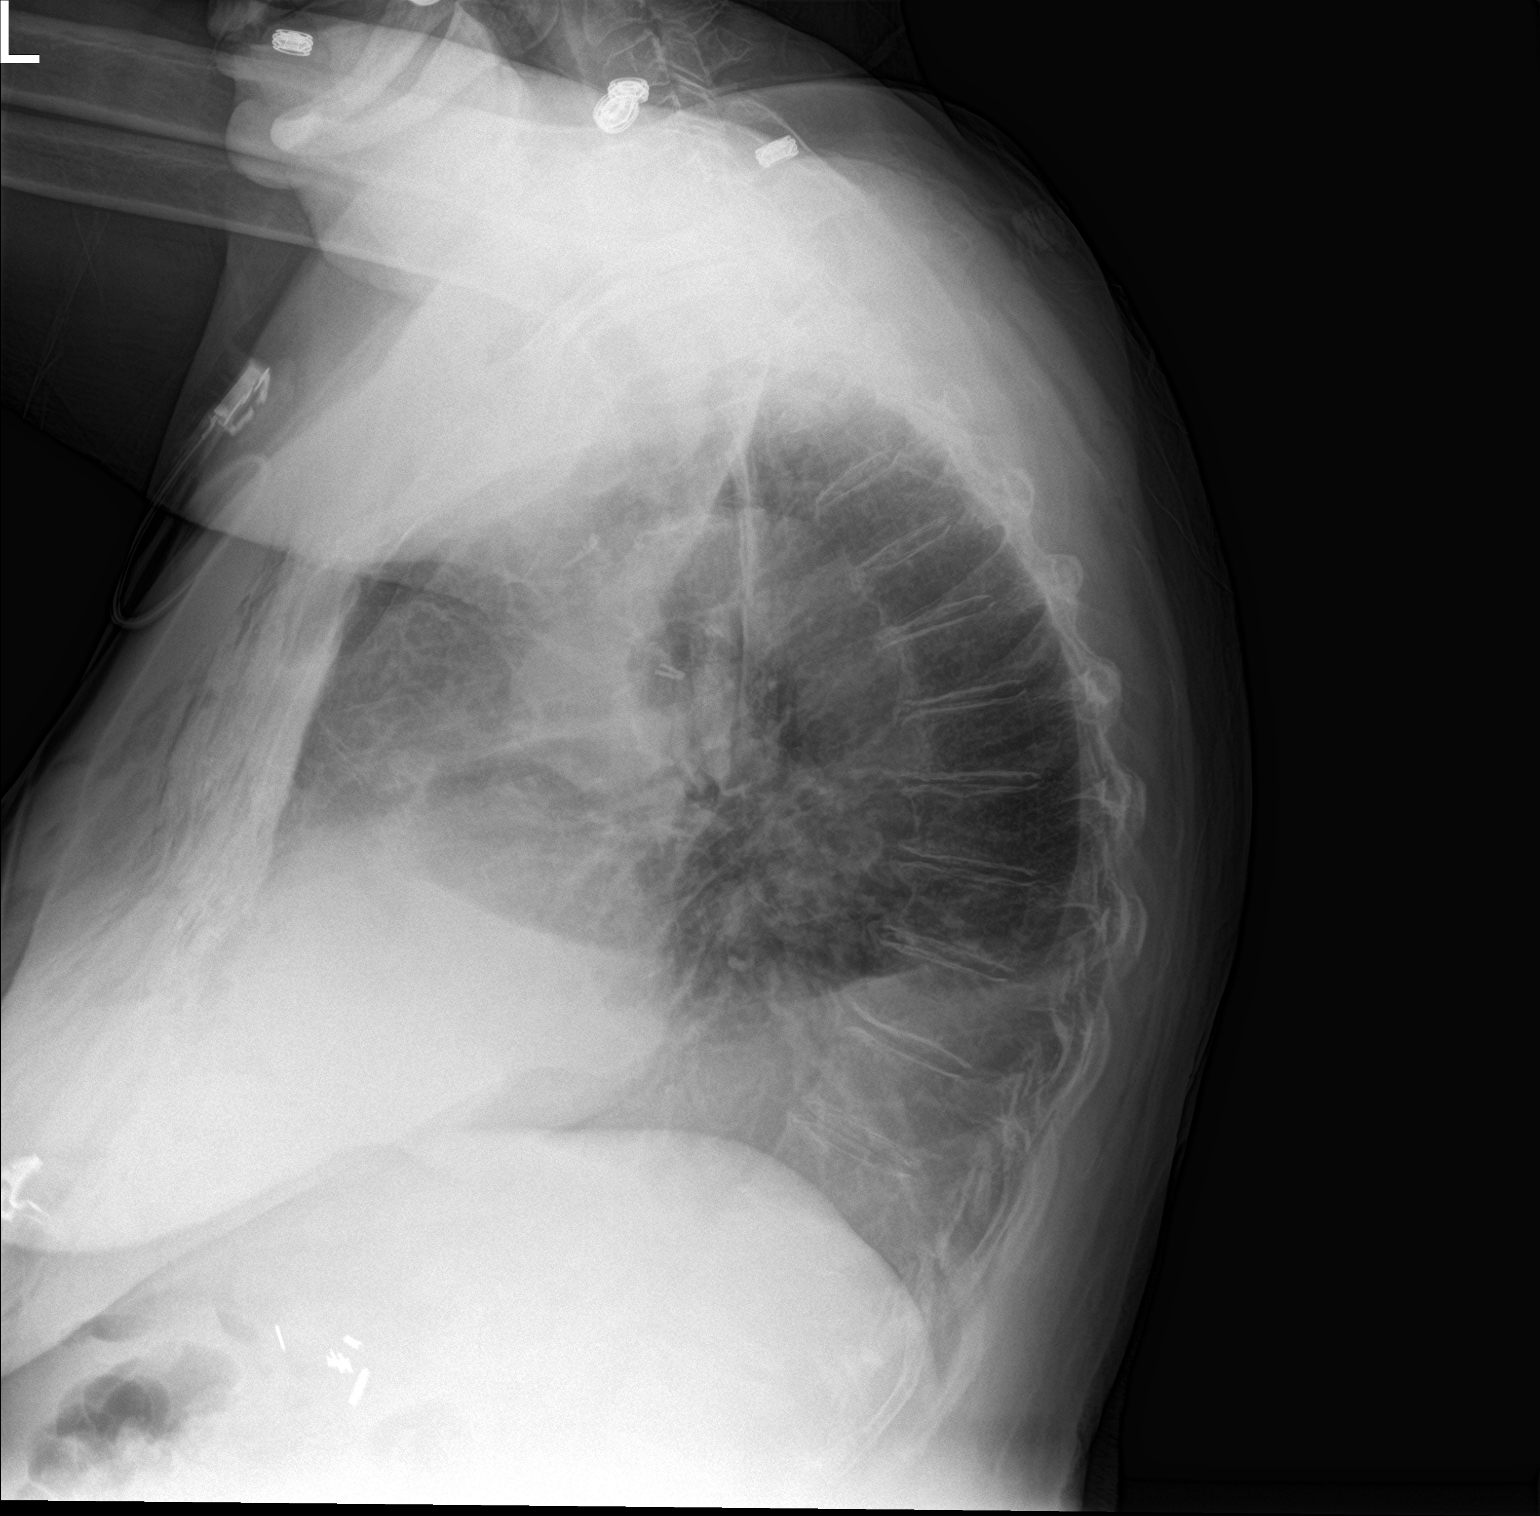

[2 of 2 positions shown; findings below may reference images not displayed]

FINDINGS: Postsurgical changes noted the right chest. Right perihilar
atelectasis and/or scarring again noted. Left lung is clear. Stable
mild cardiomegaly. No pulmonary venous congestion. No pneumo thorax.
Right chest wall subcutaneous emphysema again noted.
IMPRESSION: 1. Recent chest tube removal. Small right anterior pneumothorax
(noted on lateral view) appears to be present on today's exam. Small
right pleural effusion.

2. Postsurgical changes right lung with right perihilar atelectasis
and/or scarring.

3.  Persistent right chest wall subcutaneous emphysema.

Critical Value/emergent results were called by telephone at the time
of interpretation on [DATE] at [DATE] to Nurse, DEMOSS who
verbally acknowledged these results.

## 2016-05-16 MED ORDER — HYDROCODONE-ACETAMINOPHEN 5-325 MG PO TABS: 1.0000 | ORAL_TABLET | ORAL | 0 refills | Status: DC | PRN

## 2016-05-16 NOTE — Progress Notes (Signed)
Pt's son is called to come and pick pt up for discharge. Son states he will be here in 20 minutes.

## 2016-05-16 NOTE — Care Management Note (Signed)
Case Management Note  Patient Details  Name: [name] MRN: [id number] Date of Birth: [birth year]  Subjective/Objective:   S/p vats, lobectomy,  Patient states her family will be with her at all times, she does not need HHRN per patient , she just wants HHPT. [place] notified of dc today.                  Action/Plan:   Expected Discharge Date:                  Expected Discharge Plan:  [place]  In-House Referral:     Discharge planning Services  CM Consult  Post Acute Care Choice:    Choice offered to:  Adult Children, Patient  DME Arranged:    DME Agency:     HH Arranged:  PT HH Agency:  [facility]  Status of Service:  Completed, signed off  If discussed at [place] of Stay Meetings, dates discussed:    Additional Comments:  Zenon Mayo, RN 05/16/2016, 10:59 AM

## 2016-05-16 NOTE — Progress Notes (Signed)
Physical Therapy Treatment Patient Details Name: TIMYA TRIMMER MRN: [id number] DOB: 1940/12/19 Today's Date: 05/16/2016    History of Present Illness 75 y.o. female admitted to [facility] on 05/09/16 for VATS R wedge resection.  She has significant PMHx of low back pain, HTN, COPD, CA (uterine), anxiety, back surgery, R ankle surgery, and L hip surgery.      PT Comments    Patient progressing slowly towards PT goals. Continues to demonstrate decreased safety awareness and weakness in BLEs as well as decreased activity tolerance and endurance. Min A required for ambulation and gait training due to instability and poor safety. Pt adamant about returning home so discharge recommendation updated to HHPT and 24/7 supervision, which pt reports she has. Will continue to follow.   Follow Up Recommendations  Home health PT;Supervision for mobility/OOB;Supervision/Assistance - 24 hour     Equipment Recommendations  Rolling walker with 5" wheels;3in1 (PT)    Recommendations for Other Services       Precautions / Restrictions Precautions Precautions: Fall Precaution Comments: monitor O2 sats and HR Restrictions Weight Bearing Restrictions: No    Mobility  Bed Mobility Overal bed mobility: Needs Assistance Bed Mobility: Supine to Sit     Supine to sit: Min guard;HOB elevated     General bed mobility comments: No assist needed. Increased time to get to EOB. Cues for log roll however pt getting up her own way.  Transfers Overall transfer level: Needs assistance Equipment used: Rolling walker (2 wheeled) Transfers: Sit to/from Stand Sit to Stand: Min guard         General transfer comment: Min guard for safety. Stood from [employer]. Transferred to chair post ambulation bout.  Ambulation/Gait Ambulation/Gait assistance: Min assist Ambulation Distance (Feet): 150 Feet Assistive device: Rolling walker (2 wheeled) Gait Pattern/deviations: Step-through pattern;Decreased stride  length;Trunk flexed;Staggering right;Staggering left Gait velocity: decreased   General Gait Details: Slow, unsteady gait with bil knee instability and partial buckling. 3/4 DOE. Difficult getting Sp02, ambulated on RA ranged from high 80s-90s. Cues for pursed lip breathing. 3 standing rest breaks.   Stairs            Wheelchair Mobility    Modified Rankin (Stroke Patients Only)       Balance Overall balance assessment: Needs assistance Sitting-balance support: Feet supported;No upper extremity supported Sitting balance-Leahy Scale: Fair     Standing balance support: During functional activity Standing balance-Leahy Scale: Poor Standing balance comment: Reliant on BUEs for support.                     Cognition Arousal/Alertness: Awake/alert Behavior During Therapy: WFL for tasks assessed/performed Overall Cognitive Status: Impaired/Different from baseline Area of Impairment: Safety/judgement;Problem solving         Safety/Judgement: Decreased awareness of safety;Decreased awareness of deficits   Problem Solving: Slow processing;Difficulty sequencing;Requires verbal cues;Requires tactile cues      Exercises      General Comments General comments (skin integrity, edema, etc.): Continues to require cues to breathe through nose as pt pants through mouth during mobility.       Pertinent Vitals/Pain Pain Assessment: Faces Faces Pain Scale: Hurts whole lot Pain Location: incisional Pain Descriptors / Indicators: Grimacing;Guarding;Moaning Pain Intervention(s): Monitored during session;Repositioned;Premedicated before session    Home Living                      Prior Function            PT Goals (  current goals can now be found in the care plan section) Progress towards PT goals: Progressing toward goals    Frequency    Min 3X/week      PT Plan Discharge plan needs to be updated    Co-evaluation             End of Session  Equipment Utilized During Treatment: Gait belt;Oxygen Activity Tolerance: Patient limited by fatigue;Patient limited by pain Patient left: in chair;with call bell/phone within reach;with chair alarm set     Time: 1594-7076 PT Time Calculation (min) (ACUTE ONLY): 20 min  Charges:  $Gait Training: 8-22 mins                    G Codes:      Shauna A Hartshorne 05/16/2016, 12:15 PM Wray Kearns, [place], DPT [phone]

## 2016-05-16 NOTE — Plan of Care (Signed)
Problem: Health Behavior/Discharge Planning: Goal: Ability to manage health-related needs will improve Outcome: Completed/Met Date Met: 05/16/16 Home health PT set up by unit case manager.   Problem: Activity: Goal: Risk for activity intolerance will decrease Outcome: Completed/Met Date Met: 05/16/16 Ambulated in hallway with therapy. Sat up in chair for a short period of time this morning.

## 2016-05-16 NOTE — Progress Notes (Signed)
Pt left floor with family via wheelchair with no signs of distress. Pt verbalized discharge instructions.

## 2016-05-16 NOTE — Progress Notes (Signed)
7 Days Post-Op Procedure(s) (LRB): VIDEO ASSISTED THORACOSCOPY (VATS)/WEDGE RESECTION (Right) RIGHT UPPER LOBECTOMY (Right) Subjective: No complaints this AM  Objective: Vital signs in last 24 hours: Temp:  [98.5 F (36.9 C)-99.9 F (37.7 C)] 98.7 F (37.1 C) (10/23 0338) Pulse Rate:  [71-78] 73 (10/23 0338) Cardiac Rhythm: Normal sinus rhythm (10/23 0704) Resp:  [17-21] 17 (10/23 0338) BP: (127-148)/(61-75) 148/72 (10/23 0338) SpO2:  [92 %-98 %] 96 % (10/23 0338)  Hemodynamic parameters for last 24 hours:    Intake/Output from previous day: 10/22 0701 - 10/23 0700 In: 960 [P.O.:960] Out: -  Intake/Output this shift: No intake/output data recorded.  General appearance: alert, cooperative and no distress Neurologic: intact Heart: regular rate and rhythm Lungs: no wheezing, has pseudo wheezing clears with cough Wound: clean and dry  Lab Results: No results for input(s): WBC, HGB, HCT, PLT in the last 72 hours. BMET:  Recent Labs  05/14/16 0500  NA 137  K 3.6  CL 108  CO2 25  GLUCOSE 104*  BUN 12  CREATININE 0.97  CALCIUM 8.1*    PT/INR: No results for input(s): LABPROT, INR in the last 72 hours. ABG    Component Value Date/Time   PHART 7.324 (L) 05/09/2016 1217   HCO3 26.5 05/09/2016 1217   TCO2 28 05/09/2016 1217   ACIDBASEDEF 0.3 05/05/2016 0955   O2SAT 99.0 05/09/2016 1217   CBG (last 3)   Recent Labs  05/15/16 1631 05/15/16 1658 05/15/16 2108  GLUCAP 115* 123* 99    Assessment/Plan: S/P Procedure(s) (LRB): VIDEO ASSISTED THORACOSCOPY (VATS)/WEDGE RESECTION (Right) RIGHT UPPER LOBECTOMY (Right) Plan for discharge: see discharge orders  Doing well  Dc home later today   LOS: 7 days    [name] 05/16/2016

## 2016-05-17 ENCOUNTER — Other Ambulatory Visit: Payer: Self-pay | Admitting: *Deleted

## 2016-05-17 ENCOUNTER — Telehealth: Payer: Self-pay | Admitting: *Deleted

## 2016-05-17 DIAGNOSIS — J439 Emphysema, unspecified: Secondary | ICD-10-CM | POA: Diagnosis not present

## 2016-05-17 DIAGNOSIS — M810 Age-related osteoporosis without current pathological fracture: Secondary | ICD-10-CM | POA: Diagnosis not present

## 2016-05-17 DIAGNOSIS — Z483 Aftercare following surgery for neoplasm: Secondary | ICD-10-CM | POA: Diagnosis not present

## 2016-05-17 DIAGNOSIS — M1990 Unspecified osteoarthritis, unspecified site: Secondary | ICD-10-CM | POA: Diagnosis not present

## 2016-05-17 DIAGNOSIS — F329 Major depressive disorder, single episode, unspecified: Secondary | ICD-10-CM | POA: Diagnosis not present

## 2016-05-17 DIAGNOSIS — J441 Chronic obstructive pulmonary disease with (acute) exacerbation: Secondary | ICD-10-CM

## 2016-05-17 DIAGNOSIS — C3411 Malignant neoplasm of upper lobe, right bronchus or lung: Secondary | ICD-10-CM | POA: Diagnosis not present

## 2016-05-17 DIAGNOSIS — I10 Essential (primary) hypertension: Secondary | ICD-10-CM | POA: Diagnosis not present

## 2016-05-17 DIAGNOSIS — Z7951 Long term (current) use of inhaled steroids: Secondary | ICD-10-CM | POA: Diagnosis not present

## 2016-05-17 DIAGNOSIS — G8918 Other acute postprocedural pain: Secondary | ICD-10-CM

## 2016-05-17 LAB — TYPE AND SCREEN
ABO/RH(D): A POS
Antibody Screen: NEGATIVE
Unit division: 0
Unit division: 0
Unit division: 0
Unit division: 0

## 2016-05-17 MED ORDER — OXYCODONE HCL 5 MG PO TABS: 5.0000 mg | ORAL_TABLET | ORAL | 0 refills | Status: DC | PRN

## 2016-05-17 NOTE — Telephone Encounter (Signed)
Mrs. Charlet daughter has call to say that the Vicodin that her mother was discharged on is not strong enough. She had been on that med prior to surgery. I discussed this with Dr. Roxan Hockey, her surgeon, and he has ordered Oxycodone. I informed her that a new script would be available today at the office and she agreed. Also, Mrs. Hankinson was advised NOT to take the Vicodin while she was taking the Oxycodone.

## 2016-05-18 ENCOUNTER — Encounter: Payer: Self-pay | Admitting: *Deleted

## 2016-05-18 ENCOUNTER — Other Ambulatory Visit: Payer: Self-pay | Admitting: *Deleted

## 2016-05-18 DIAGNOSIS — R911 Solitary pulmonary nodule: Secondary | ICD-10-CM | POA: Diagnosis not present

## 2016-05-18 NOTE — Patient Outreach (Signed)
[place] [name] Care Management [place] Telephone Outreach, Transition of Care, day 1 05/18/2016  [name] [birth year] [id number]   Successful telephone outreach to Pasty Arch, sister and caregiver (on [facility] written consent) of [name], 75 y/o female referred to [place] for transition of care after recent hospitalization October 16-23, 2017 for (R) upper lobectomy.  Patient has history including but not limited to COPD, HTN, HLD, CKD, and DVT.  Patient was discharged home with home health [facility]) services for PT and RN.  HIPAA/ identity verified initially with caregiver Horris Latino, and then later during call, verified directly with patient.  Today, patient and her sister report:  -- Pt. has all medications and is taking as prescribed; questions regarding medications denied.  Patient reports that she manages and administers her own medications without difficulty.  Patient verbalizes time constraints today and does not wish to perform formal medication reconciliation, stating she would rather go over medications in person.  -- Home Health [facility]) services in progress; state that PT visited with patient yesterday and has plans to return "this Friday."  Patient reports that she has phone number for [facility] agency, states she can not remember name of company at this time, as she "just woke up from a nap."  -- Patient has scheduled provider appointment with CVTS surgeon on May 31, 2016.  Patient verbalizes plans to attend appointment stating that her sister will provide transportation.  -- [employer] needs denied at present by patient, who states that her family is supportive and assist with care needs.  -- Patient complains of surgical pain and states that she contacted surgeon's office yesterday, and pain medication was changed to something stronger; patient states that she has picked this medication up and is taking and it is "helping a little bit."     Patient did not wish to talk on phone for any longer today, stating that she was too tired; [place] services were discussed with patient, including how [facility] CM differs from [facility] services.  Patient verbally agreed to [facility] CM involvement on her care and we were able to schedule an initial in-home visit for next week today.  I provided patient with my direct phone number, the main office number for [facility] CM, and the [facility] 24-hour nurse advice line phone number.  Patient  And her sister deny further questions, concerns, issues, or problems today.  Plan:  Patient will continue to take her medications as prescribed and attend all scheduled provider appointments.  Patient will continue working with home health RN/ PT services as ordered post-hospital discharge.  Patient will contact her providers for any new concerns, questions, problems, or issues.   I will make patient's PCP aware of [facility] CM involvement in patient's care.  [place] outreach for transition of care to continue with initial home visit scheduled for next week.  [name], RN, BSN, [place] Care Management  [phone]               [name], RN, BSN, [place] Coordinator [facility] Care Management  [phone]

## 2016-05-20 DIAGNOSIS — F329 Major depressive disorder, single episode, unspecified: Secondary | ICD-10-CM | POA: Diagnosis not present

## 2016-05-20 DIAGNOSIS — J439 Emphysema, unspecified: Secondary | ICD-10-CM | POA: Diagnosis not present

## 2016-05-20 DIAGNOSIS — M1990 Unspecified osteoarthritis, unspecified site: Secondary | ICD-10-CM | POA: Diagnosis not present

## 2016-05-20 DIAGNOSIS — I10 Essential (primary) hypertension: Secondary | ICD-10-CM | POA: Diagnosis not present

## 2016-05-20 DIAGNOSIS — Z483 Aftercare following surgery for neoplasm: Secondary | ICD-10-CM | POA: Diagnosis not present

## 2016-05-20 DIAGNOSIS — M810 Age-related osteoporosis without current pathological fracture: Secondary | ICD-10-CM | POA: Diagnosis not present

## 2016-05-20 DIAGNOSIS — C3411 Malignant neoplasm of upper lobe, right bronchus or lung: Secondary | ICD-10-CM | POA: Diagnosis not present

## 2016-05-20 DIAGNOSIS — Z7951 Long term (current) use of inhaled steroids: Secondary | ICD-10-CM | POA: Diagnosis not present

## 2016-05-23 DIAGNOSIS — Z483 Aftercare following surgery for neoplasm: Secondary | ICD-10-CM | POA: Diagnosis not present

## 2016-05-23 DIAGNOSIS — J439 Emphysema, unspecified: Secondary | ICD-10-CM | POA: Diagnosis not present

## 2016-05-23 DIAGNOSIS — F329 Major depressive disorder, single episode, unspecified: Secondary | ICD-10-CM | POA: Diagnosis not present

## 2016-05-23 DIAGNOSIS — Z7951 Long term (current) use of inhaled steroids: Secondary | ICD-10-CM | POA: Diagnosis not present

## 2016-05-23 DIAGNOSIS — M1990 Unspecified osteoarthritis, unspecified site: Secondary | ICD-10-CM | POA: Diagnosis not present

## 2016-05-23 DIAGNOSIS — I10 Essential (primary) hypertension: Secondary | ICD-10-CM | POA: Diagnosis not present

## 2016-05-23 DIAGNOSIS — M810 Age-related osteoporosis without current pathological fracture: Secondary | ICD-10-CM | POA: Diagnosis not present

## 2016-05-23 DIAGNOSIS — C3411 Malignant neoplasm of upper lobe, right bronchus or lung: Secondary | ICD-10-CM | POA: Diagnosis not present

## 2016-05-26 ENCOUNTER — Other Ambulatory Visit: Payer: Self-pay | Admitting: *Deleted

## 2016-05-26 ENCOUNTER — Encounter: Payer: Self-pay | Admitting: *Deleted

## 2016-05-26 DIAGNOSIS — J439 Emphysema, unspecified: Secondary | ICD-10-CM | POA: Diagnosis not present

## 2016-05-26 DIAGNOSIS — F329 Major depressive disorder, single episode, unspecified: Secondary | ICD-10-CM | POA: Diagnosis not present

## 2016-05-26 DIAGNOSIS — M1990 Unspecified osteoarthritis, unspecified site: Secondary | ICD-10-CM | POA: Diagnosis not present

## 2016-05-26 DIAGNOSIS — Z7951 Long term (current) use of inhaled steroids: Secondary | ICD-10-CM | POA: Diagnosis not present

## 2016-05-26 DIAGNOSIS — M810 Age-related osteoporosis without current pathological fracture: Secondary | ICD-10-CM | POA: Diagnosis not present

## 2016-05-26 DIAGNOSIS — I10 Essential (primary) hypertension: Secondary | ICD-10-CM | POA: Diagnosis not present

## 2016-05-26 DIAGNOSIS — Z483 Aftercare following surgery for neoplasm: Secondary | ICD-10-CM | POA: Diagnosis not present

## 2016-05-26 DIAGNOSIS — C3411 Malignant neoplasm of upper lobe, right bronchus or lung: Secondary | ICD-10-CM | POA: Diagnosis not present

## 2016-05-26 NOTE — Patient Outreach (Signed)
[place] [facility]) Care Management [place] Initial Home Visit, Transition of Care day 9 05/26/2016  [name] 1940-10-26 [id number]  [name] is a 75 y/o female referred to [place] for transition of care after recent hospitalization October 16-23, 2017 for (R) upper lobectomy.  Patient has history including but not limited to COPD, HTN, HLD, CKD, and DVT.  Patient was discharged home with home health [facility]) services for PT and RN.  HIPAA/ identity verified with patient today in person.  When I arrived at patient's home, patient met me on her front porch, and stated that she had forgotten that I was coming to visit her today and "was not ready" for my visit.  I offered to wait in my car for a few minutes, but she admanatly refused, saying that she would prefer we re-schedule initial home visit "for Monday or Tuesday next week."  I checked my calendar/ schedule and confirmed with patient that she was planning to attend follow up office visit with Dr. Roxan Hockey on morning of Tuesday May 31, 2016.  I asked patient if I could visit her that afternoon at 1:00 pm, and she said that was fine with her.  I provided patient with my business card and added the date and time of our next scheduled visit on it.  Approximately one and a half hours later, I received a voicemail message from the patient, apologizing that she had forgotten our visit this afternoon.  I returned the patient call, and stated that she was doing "just fine," and we confirmed the re-scheduled home visit for next week on Tuesday May 31, 2016.  Patient did not feel up to talking on the phone this afternoon and said that she would talk with me more during our re-scheduled visit next week.  Patient confirmed that she had all of her medications and was taking as they are prescribed, and she confirmed that [employer] [facility] services were active, stating, "they came to visit me this morning."  I then placed a call to  patient's sister [name], (on [facility] CM written consent), to make her aware of re-scheduled appointment, and [name] confirmed that she would make sure patient was present for visit, and that she plan to be there as well.  Plan:  Patient will continue to take her medications as prescribed and attend all scheduled provider appointments.  Patient will continue working with home health RN/ PT services as ordered post-hospital discharge.  Patient will contact her providers for any new concerns, questions, problems, or issues.   [place] outreach for transition of care to continue with initial home visit re-scheduled for next week.  Felicia Rack, RN, BSN, [employer] [facility] Care Management  [phone]

## 2016-05-30 ENCOUNTER — Other Ambulatory Visit: Payer: Self-pay | Admitting: Thoracic Surgery (Cardiothoracic Vascular Surgery)

## 2016-05-30 DIAGNOSIS — M1990 Unspecified osteoarthritis, unspecified site: Secondary | ICD-10-CM | POA: Diagnosis not present

## 2016-05-30 DIAGNOSIS — Z902 Acquired absence of lung [part of]: Secondary | ICD-10-CM

## 2016-05-30 DIAGNOSIS — J439 Emphysema, unspecified: Secondary | ICD-10-CM | POA: Diagnosis not present

## 2016-05-30 DIAGNOSIS — M810 Age-related osteoporosis without current pathological fracture: Secondary | ICD-10-CM | POA: Diagnosis not present

## 2016-05-30 DIAGNOSIS — I10 Essential (primary) hypertension: Secondary | ICD-10-CM | POA: Diagnosis not present

## 2016-05-30 DIAGNOSIS — Z483 Aftercare following surgery for neoplasm: Secondary | ICD-10-CM | POA: Diagnosis not present

## 2016-05-30 DIAGNOSIS — Z7951 Long term (current) use of inhaled steroids: Secondary | ICD-10-CM | POA: Diagnosis not present

## 2016-05-30 DIAGNOSIS — C3411 Malignant neoplasm of upper lobe, right bronchus or lung: Secondary | ICD-10-CM | POA: Diagnosis not present

## 2016-05-30 DIAGNOSIS — F329 Major depressive disorder, single episode, unspecified: Secondary | ICD-10-CM | POA: Diagnosis not present

## 2016-05-31 ENCOUNTER — Ambulatory Visit: Payer: Commercial Managed Care - HMO | Admitting: Thoracic Surgery (Cardiothoracic Vascular Surgery)

## 2016-05-31 ENCOUNTER — Encounter: Payer: Self-pay | Admitting: Thoracic Surgery (Cardiothoracic Vascular Surgery)

## 2016-05-31 ENCOUNTER — Ambulatory Visit
Admission: RE | Admit: 2016-05-31 | Discharge: 2016-05-31 | Disposition: A | Discharge status: Home / Self Care | Payer: Commercial Managed Care - HMO | Source: Ambulatory Visit | Attending: Thoracic Surgery (Cardiothoracic Vascular Surgery) | Admitting: Thoracic Surgery (Cardiothoracic Vascular Surgery)

## 2016-05-31 ENCOUNTER — Ambulatory Visit (INDEPENDENT_AMBULATORY_CARE_PROVIDER_SITE_OTHER): Payer: Self-pay | Admitting: Thoracic Surgery (Cardiothoracic Vascular Surgery)

## 2016-05-31 ENCOUNTER — Other Ambulatory Visit: Payer: Self-pay | Admitting: *Deleted

## 2016-05-31 ENCOUNTER — Encounter: Payer: Self-pay | Admitting: *Deleted

## 2016-05-31 DIAGNOSIS — J90 Pleural effusion, not elsewhere classified: Secondary | ICD-10-CM | POA: Diagnosis not present

## 2016-05-31 DIAGNOSIS — G8918 Other acute postprocedural pain: Secondary | ICD-10-CM

## 2016-05-31 DIAGNOSIS — Z902 Acquired absence of lung [part of]: Secondary | ICD-10-CM

## 2016-05-31 IMAGING — CR DG CHEST 2V
2 series · 2 of 2 positions shown · non-contrast
Comparison: [DATE].

CLINICAL DATA: Right upper lobectomy.

EXAM:
CHEST  2 VIEW

[w chest pa]
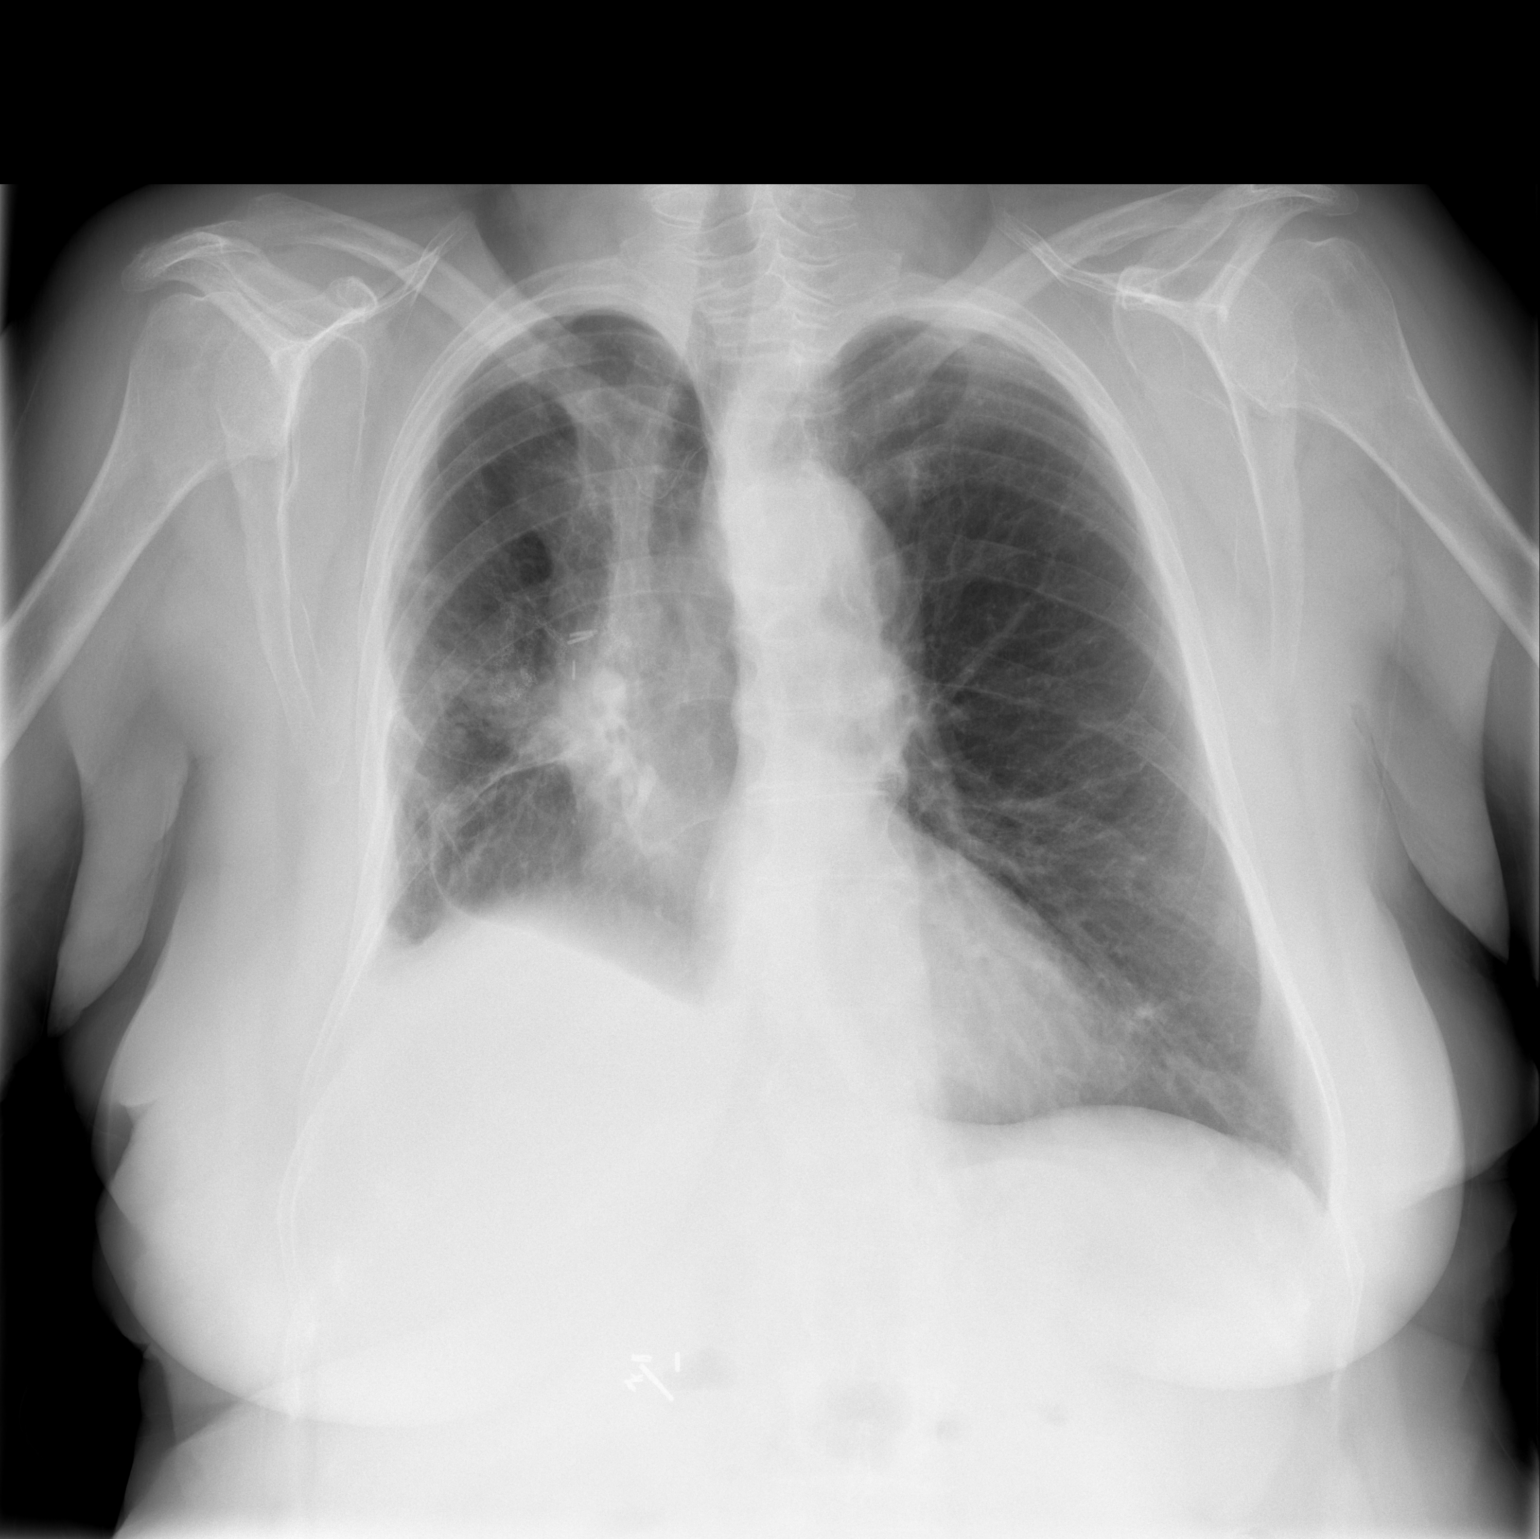

[w chest lat]
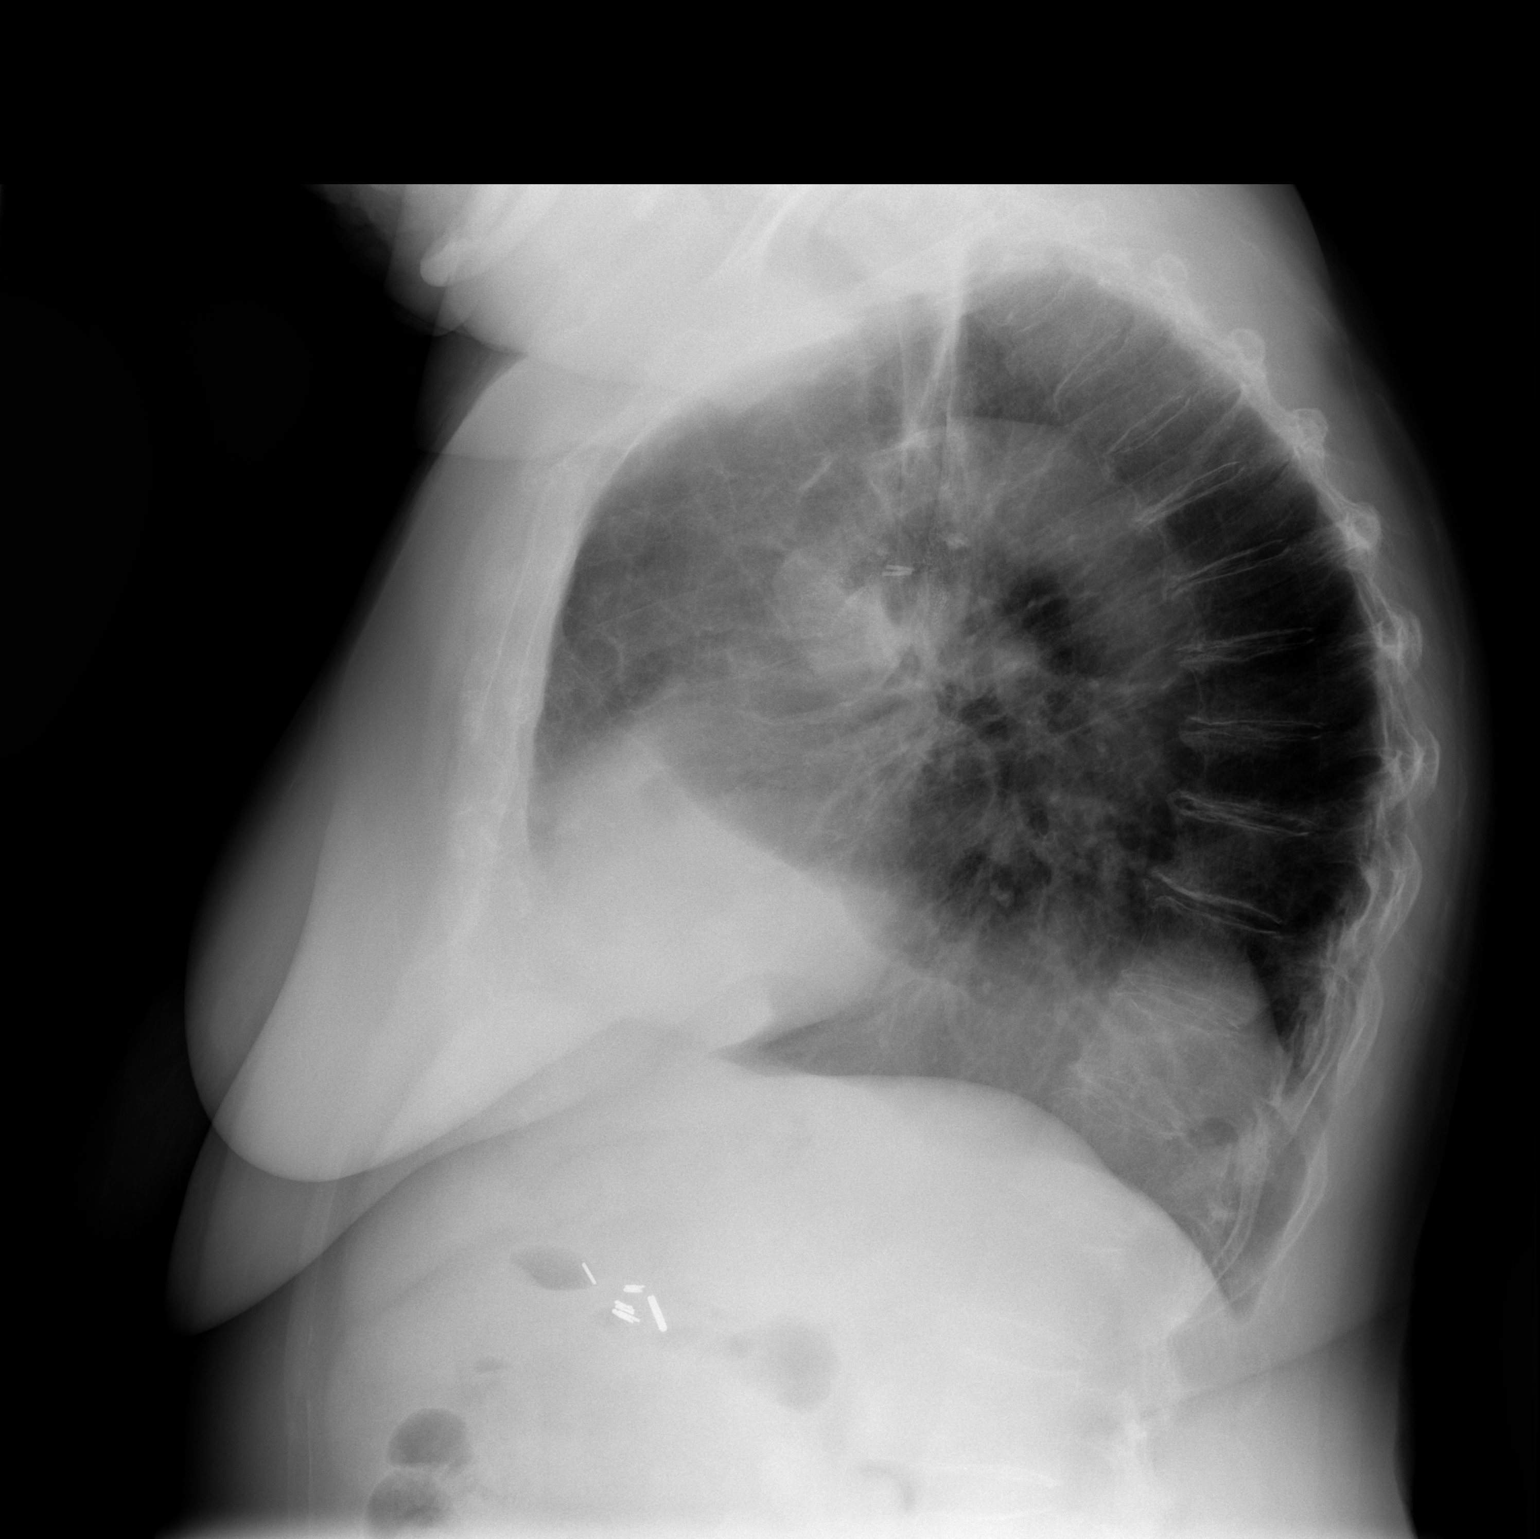

[2 of 2 positions shown; findings below may reference images not displayed]

FINDINGS: Postsurgical changes right chest. Left lung is clear. Heart size
normal. Tiny right pleural effusion. No pneumothorax. Interval
resolution of right chest wall subcutaneous emphysema.
IMPRESSION: 1. Stable postsurgical changes right chest with tiny right pleural
effusion. No pneumothorax. No acute cardiopulmonary abnormality
identified.

2. Interval rest with dilution of right chest wall subcutaneous
emphysema.

## 2016-05-31 MED ORDER — OXYCODONE HCL 5 MG PO TABS
5.0000 mg | ORAL_TABLET | Freq: Four times a day (QID) | ORAL | 0 refills | Status: DC | PRN
Start: 2016-05-31 — End: 2016-08-02

## 2016-05-31 NOTE — Patient Outreach (Signed)
[facility]) Care Management  [place] Initial Home Visit Transition of Care day 14 05/31/2016  DIONICIA CERRITOS 1940-10-29 [id number]  SYLA DEVOSS is an 75 y.o. female referred to [place] for transition of care after recent hospitalization October 16-23, 2017 for (R) upper lobectomy. Patient has history including but not limited to COPD, HTN, HLD, CKD, and DVT. Patient was discharged home with home health [facility]) services for PT and RN. HIPAA/ identity verified with patient today in person.  Today, Clementina reports that she is "doing great," but states she is tired after a busy morning where she attended a follow up appointment with her CVTS Dr. Roxan Hockey.  Jazlynne is requesting a "short" visit today so she can "rest."  -- Provider appointments:  Phillippa reports that she attended follow up visit with CVTS Dr. Roxan Hockey today and "got a real good report."  Mahlet states that she does not have any questions about her visit today.  She states that has been planning to make an appointment with her PCP but hasn't had time yet since her discharge home from the hospital.  Stanton Kidney voices plans to make PCP appointment "soon," and I encouraged her to do so.  We reviewed all upcoming provider appointments today.  -- Medications:  Taygen states that she has and is taking all of her medications as they are prescribed.  Alexis states that she is "too tired" to go get all of her medications for [place] to review, but she agreed to discuss her medications, which she verbalized a good understanding of.  Patient was recently discharged from hospital and all medications were reviewed with her verbally today.  Sierra denied questions about her medications today.  -- Safety: No safety issues identified in patient's home today and she reports "no falls."  Patient reports that she has continued having post-operative pain in her (R) lateral chest which she reports is well controlled by current pain medication  regimen.  -- Data processing manager Needs:  Amoura denies [employer] needs, stating that she has a supportive family system that help her with her needs.  Adilen reports that although she has not yet been released to drive after her recent surgery, she does normally drive.  -- Home Health [facility]) services:  Kyrstin reports that she is actively participating in [facility] services for PT and RN, through [place].  Donasia reports that [facility] "is going well."  -- Advanced Directives:  Ciarra reports that she does not have active Advanced Directives (AD), and voices questions about AD planning.  Discussed with/ provided education and Advanced Directive information packet to patient.  -- Self-health management of COPD:  Kawana states that she is "doing okay" and not having any concerning signs/ symptoms from her baseline.  Patient is short of breath with activity, but recovers promptly with rest while on room air.  Zamira reports that she has previously been on O2 at home, but "isn't anymore."  Mayda is familiar with COPD zones, and we discussed/ reviewed action plans for COPD zones.   Lacheryl denies further concerns, issues, problems or needs today.  We discussed [facility] CM services, and Natalynn voiced that she would like to have continued in-home visits from [place], "at least when home health stops, so someone will have an eye on me."   We scheduled next [place] in-home visit for next month.  Subjective: "I am exhausted from my doctor's appointment today, but other than that, I am doing  great since my surgery."  Objective:   BP 122/64   Pulse 92   Resp 16   SpO2 96%     Review of Systems  Constitutional: Positive for malaise/fatigue. Negative for fever and weight loss.  Respiratory: Positive for cough and shortness of breath. Negative for sputum production and wheezing.   Cardiovascular: Negative for chest pain and leg swelling.       Patient reports (R) lateral surgical chest pain  Gastrointestinal:  Negative for abdominal pain and nausea.  Genitourinary: Negative.   Musculoskeletal: Negative for falls.  Neurological: Negative for weakness.  Psychiatric/Behavioral: Negative for depression. The patient is not nervous/anxious.     Physical Exam  Constitutional: She is oriented to person, place, and time. She appears well-developed and well-nourished. No distress.  Cardiovascular: Normal rate, regular rhythm, normal heart sounds and intact distal pulses.   Pulses:      Radial pulses are 2+ on the right side, and 2+ on the left side.       Dorsalis pedis pulses are 2+ on the right side, and 2+ on the left side.  Respiratory: Effort normal and breath sounds normal. No respiratory distress. She has no wheezes. She has no rales. She exhibits tenderness.  (R) lateral chest surgical soreness/ pain  GI: Soft. Bowel sounds are normal.  Musculoskeletal: She exhibits no edema.  Neurological: She is alert and oriented to person, place, and time.  Skin: Skin is warm and dry. No erythema.  Psychiatric: She has a normal mood and affect. Her behavior is normal. Judgment and thought content normal.    Encounter Medications:   Outpatient Encounter Prescriptions as of 05/31/2016  Medication Sig  . albuterol (PROVENTIL HFA;VENTOLIN HFA) 108 (90 BASE) MCG/ACT inhaler Inhale 2 puffs into the lungs every 6 (six) hours as needed. (Patient taking differently: Inhale 2 puffs into the lungs every 4 (four) hours as needed for wheezing or shortness of breath. )  . albuterol (PROVENTIL) (2.5 MG/3ML) 0.083% nebulizer solution Take 3 mLs (2.5 mg total) by nebulization every 2 (two) hours as needed for wheezing or shortness of breath.  Marland Kitchen amLODipine (NORVASC) 2.5 MG tablet Take 5 mg by mouth at bedtime.   Marland Kitchen atorvastatin (LIPITOR) 20 MG tablet Take 20 mg by mouth daily.   . diazepam (VALIUM) 5 MG tablet Take 5 mg by mouth at bedtime as needed for anxiety (for nerves).   . Fluticasone-Salmeterol (ADVAIR DISKUS) 250-50  MCG/DOSE AEPB Inhale 1 puff into the lungs every 12 (twelve) hours.  . gabapentin (NEURONTIN) 100 MG capsule Take 200 mg by mouth every morning.   . Glucosamine HCl (GLUCOSAMINE PO) Take 1 tablet by mouth daily.  Marland Kitchen HYDROcodone-acetaminophen (NORCO/VICODIN) 5-325 MG tablet Take 1 tablet by mouth every 4 (four) hours as needed for moderate pain.  Marland Kitchen lisinopril (PRINIVIL,ZESTRIL) 30 MG tablet Take 30 mg by mouth every morning.  . Magnesium 250 MG TABS Take 250 mg by mouth daily.  . metoCLOPramide (REGLAN) 5 MG tablet Take 5 mg by mouth 2 (two) times daily.   Marland Kitchen oxyCODONE (OXY IR/ROXICODONE) 5 MG immediate release tablet Take 1 tablet (5 mg total) by mouth every 6 (six) hours as needed for severe pain.  . pantoprazole (PROTONIX) 40 MG tablet Take 1 tablet (40 mg total) by mouth daily at 6 (six) AM.  . raloxifene (EVISTA) 60 MG tablet Take 60 mg by mouth daily.  . sertraline (ZOLOFT) 100 MG tablet Take 100 mg by mouth daily.  Marland Kitchen tiotropium (SPIRIVA) 18 MCG  inhalation capsule Place 18 mcg into inhaler and inhale daily.     No facility-administered encounter medications on file as of 05/31/2016.     Functional Status:   In your present state of health, do you have any difficulty performing the following activities: 05/09/2016 05/05/2016  Hearing? N N  Vision? N N  Difficulty concentrating or making decisions? N N  Walking or climbing stairs? N N  Dressing or bathing? N N  Doing errands, shopping? N N  Preparing Food and eating ? - -  Using the Toilet? - -  In the past six months, have you accidently leaked urine? - -  Do you have problems with loss of bowel control? - -  Managing your Medications? - -  Managing your Finances? - -  Housekeeping or managing your Housekeeping? - -  Some recent data might be hidden    Fall/Depression Screening:    PHQ 2/9 Scores 03/18/2016 09/30/2015 08/17/2015 07/13/2015 05/05/2015 05/02/2015  PHQ - 2 Score 0 '1 1 1 6 '$ 0  PHQ- 9 Score - - - - 12 -    Assessment:   Desyre is recuperating well after her recent surgery, but continues to experience ongoing post-operative pain, which she believes is her "biggest challenge" now that she is at home.  Kevyn reports a supportive family support system and currently denies community resource needs.  Carmella is committed to keeping all scheduled provider appointments and to compliance in general with her medical plan of care.  Christmas would like to review Advanced Directives packet and COPD zones for further discussion at next [place] in-home visit.   Plan:   Patient will continue to take her medications as prescribed and attend all scheduled provider appointments.  Patient will make a follow up visit with her PCP.  Patient will continue working with home health RN/ PTservices as ordered post-hospital discharge.  Patient will re-review COPD zones and corresponding action plans.  Patient will contact her providers for any new concerns, questions, problems, or issues.   [place] outreachfor transition of care to continue with scheduled phone call next week.   [name], RN, BSN, [employer] [facility] Care Management  [phone]

## 2016-05-31 NOTE — Progress Notes (Signed)
[address]       [place] [address]             [phone]       HPI: Mrs. Feild returns for a scheduled postoperative follow-up visit.  She is a 75 year old former smoker who was found to have a right upper lobe nodule on a low-dose screening CT. I did a right VATS, wedge resection and thoracoscopic right upper lobectomy on 05/10/2016. 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  Her postoperative course was a cough.. She did have a lot of incisional pain. She was initially sent home on hydrocodone, but that was unaffected. She was given a prescription for oxycodone 5 mg by mouth every 4 hours when necessary on 05/17/2016. She was using that pretty much around-the-clock until she ran out a couple of days ago. She has been having a lot of pain since then. She is not having any difficulties with her breathing. She has been working with home physical therapy.   Past Medical History:  Diagnosis Date  . Anxiety   . Cancer ([place])    uterine  . COPD (chronic obstructive pulmonary disease) ([place])   . Cyst    left side of neck  . Depression   . GERD (gastroesophageal reflux disease)   . Heart murmur    per patient  . Hyperlipidemia   . Hypertension   . Low back pain   . Lumbar back pain   . Osteoarthritis   . Osteoporosis     Current Outpatient Prescriptions  Medication Sig Dispense Refill  . albuterol (PROVENTIL HFA;VENTOLIN HFA) 108 (90 BASE) MCG/ACT inhaler Inhale 2 puffs into the lungs every 6 (six) hours as needed. (Patient taking differently: Inhale 2 puffs into the lungs every 4 (four) hours as needed for wheezing or shortness of breath. ) 18 g 1  . albuterol (PROVENTIL) (2.5 MG/3ML) 0.083% nebulizer solution Take 3 mLs (2.5 mg total) by nebulization every 2 (two) hours as needed for wheezing or shortness of breath. 75 mL 12  . amLODipine (NORVASC) 2.5 MG tablet Take 5 mg by mouth at bedtime.     Marland Kitchen atorvastatin  (LIPITOR) 20 MG tablet Take 20 mg by mouth daily.     . diazepam (VALIUM) 5 MG tablet Take 5 mg by mouth at bedtime as needed for anxiety (for nerves).     . Fluticasone-Salmeterol (ADVAIR DISKUS) 250-50 MCG/DOSE AEPB Inhale 1 puff into the lungs every 12 (twelve) hours. 60 each 0  . gabapentin (NEURONTIN) 100 MG capsule Take 200 mg by mouth every morning.     . Glucosamine HCl (GLUCOSAMINE PO) Take 1 tablet by mouth daily.    Marland Kitchen HYDROcodone-acetaminophen (NORCO/VICODIN) 5-325 MG tablet Take 1 tablet by mouth every 4 (four) hours as needed for moderate pain. 30 tablet 0  . lisinopril (PRINIVIL,ZESTRIL) 30 MG tablet Take 30 mg by mouth every morning.    . Magnesium 250 MG TABS Take 250 mg by mouth daily.    . metoCLOPramide (REGLAN) 5 MG tablet Take 5 mg by mouth 2 (two) times daily.     Marland Kitchen oxyCODONE (OXY IR/ROXICODONE) 5 MG immediate release tablet Take 1 tablet (5 mg total) by mouth every 6 (six) hours as needed for severe pain. 30 tablet 0  . pantoprazole (PROTONIX) 40 MG tablet Take 1 tablet (40 mg total) by mouth daily at 6 (six) AM. 30 tablet 0  . raloxifene (EVISTA) 60 MG tablet  Take 60 mg by mouth daily.    . sertraline (ZOLOFT) 100 MG tablet Take 100 mg by mouth daily.    Marland Kitchen tiotropium (SPIRIVA) 18 MCG inhalation capsule Place 18 mcg into inhaler and inhale daily.       No current facility-administered medications for this visit.     Physical Exam BP 138/88 (BP Location: Left Arm, Patient Position: Sitting, Cuff Size: Small)   Pulse 98   Resp 20   Ht '5\' 2"'$  (1.575 m)   Wt 166 lb (75.3 kg)   SpO2 98% Comment: RA  BMI 30.36 kg/m  Obese 75 year old woman in no acute distress Hard of hearing Incisions healing well Cardiac regular rate and rhythm Lungs diminished right base, otherwise clear No peripheral edema  Diagnostic Tests: CHEST  2 VIEW  COMPARISON:  05/16/2016.  FINDINGS: Postsurgical changes right chest. Left lung is clear. Heart size normal. Tiny right pleural  effusion. No pneumothorax. Interval resolution of right chest wall subcutaneous emphysema.  IMPRESSION: 1. Stable postsurgical changes right chest with tiny right pleural effusion. No pneumothorax. No acute cardiopulmonary abnormality identified.  2. Interval rest with dilution of right chest wall subcutaneous emphysema.   Electronically Signed   By: Marcello Moores  Register   On: 05/31/2016 10:40 I personally reviewed the chest x-ray currently the findings as noted above.  Impression: Mrs. Frangos is a 75 year old woman who was found to have a right upper lobe lung nodule on a low-dose screening CT. It turned out to be a stage IA non-small cell carcinoma. She's had right upper lobectomy and should not need any adjuvant therapy.  I am going to refer her to our multidisciplinary thoracic oncology clinic to see Dr. Julien Nordmann.  I am also going to refer her to pulmonary rehabilitation  I instructed her not to drive until she is no longer needing narcotics for pain. Her activities are otherwise unrestricted but she was instructed to build into new activities gradually to avoid undue pain.  She has run out of oxycodone so I gave her a new prescription for 5 mg by mouth every 6 hours when necessary, 30 tablets, no refills  Plan:  Refer to pulmonary rehabilitation  Refer to multidisciplinary thoracic oncology clinic to see oncology.  Oxycodone 5 mg by mouth every 6 hours when necessary, dispense 30, no refills  Return in 2 months with PA and lateral chest x-ray  [name], MD [facility] [phone]

## 2016-06-02 DIAGNOSIS — Z483 Aftercare following surgery for neoplasm: Secondary | ICD-10-CM | POA: Diagnosis not present

## 2016-06-02 DIAGNOSIS — F329 Major depressive disorder, single episode, unspecified: Secondary | ICD-10-CM | POA: Diagnosis not present

## 2016-06-02 DIAGNOSIS — I10 Essential (primary) hypertension: Secondary | ICD-10-CM | POA: Diagnosis not present

## 2016-06-02 DIAGNOSIS — Z7951 Long term (current) use of inhaled steroids: Secondary | ICD-10-CM | POA: Diagnosis not present

## 2016-06-02 DIAGNOSIS — C3411 Malignant neoplasm of upper lobe, right bronchus or lung: Secondary | ICD-10-CM | POA: Diagnosis not present

## 2016-06-02 DIAGNOSIS — M810 Age-related osteoporosis without current pathological fracture: Secondary | ICD-10-CM | POA: Diagnosis not present

## 2016-06-02 DIAGNOSIS — M1990 Unspecified osteoarthritis, unspecified site: Secondary | ICD-10-CM | POA: Diagnosis not present

## 2016-06-02 DIAGNOSIS — J439 Emphysema, unspecified: Secondary | ICD-10-CM | POA: Diagnosis not present

## 2016-06-07 ENCOUNTER — Encounter: Payer: Self-pay | Admitting: *Deleted

## 2016-06-07 ENCOUNTER — Other Ambulatory Visit: Payer: Self-pay | Admitting: *Deleted

## 2016-06-07 DIAGNOSIS — I10 Essential (primary) hypertension: Secondary | ICD-10-CM | POA: Diagnosis not present

## 2016-06-07 DIAGNOSIS — Z483 Aftercare following surgery for neoplasm: Secondary | ICD-10-CM | POA: Diagnosis not present

## 2016-06-07 DIAGNOSIS — F329 Major depressive disorder, single episode, unspecified: Secondary | ICD-10-CM | POA: Diagnosis not present

## 2016-06-07 DIAGNOSIS — J439 Emphysema, unspecified: Secondary | ICD-10-CM | POA: Diagnosis not present

## 2016-06-07 DIAGNOSIS — J449 Chronic obstructive pulmonary disease, unspecified: Secondary | ICD-10-CM | POA: Diagnosis not present

## 2016-06-07 DIAGNOSIS — N183 Chronic kidney disease, stage 3 (moderate): Secondary | ICD-10-CM | POA: Diagnosis not present

## 2016-06-07 DIAGNOSIS — M1990 Unspecified osteoarthritis, unspecified site: Secondary | ICD-10-CM | POA: Diagnosis not present

## 2016-06-07 DIAGNOSIS — Z23 Encounter for immunization: Secondary | ICD-10-CM | POA: Diagnosis not present

## 2016-06-07 DIAGNOSIS — M810 Age-related osteoporosis without current pathological fracture: Secondary | ICD-10-CM | POA: Diagnosis not present

## 2016-06-07 DIAGNOSIS — Z7951 Long term (current) use of inhaled steroids: Secondary | ICD-10-CM | POA: Diagnosis not present

## 2016-06-07 DIAGNOSIS — C3491 Malignant neoplasm of unspecified part of right bronchus or lung: Secondary | ICD-10-CM | POA: Diagnosis not present

## 2016-06-07 DIAGNOSIS — C3411 Malignant neoplasm of upper lobe, right bronchus or lung: Secondary | ICD-10-CM | POA: Diagnosis not present

## 2016-06-07 NOTE — Patient Outreach (Addendum)
[place] [facility]) Care Management [place] Telephone Outreach, Transition of Care day 21 06/07/2016  [name] 12/23/1940 [id number]  [name] is an 75 y.o. female referred to [name] for transition of care after recent hospitalization October 16-23, 2017 for (R) upper lobectomy. Patient has history including but not limited to COPD, HTN, HLD, CKD, and DVT. Patient was discharged home with home health [name]) services for PT and [name]. HIPAA/ identity verified.  Today, [name] reports that she is "doing fine."   -- Provider appointments:  [name] reports that she attended follow up PCP visit with [name] today, where "no changes were made."  "He gave me a good report and said I was doing good."  [name] reports that she obtained her flu vaccine shot today during PCP visit.  -- Medications:  [name] states that she has and is taking all of her medications as they are prescribed.  [name] denied questions about her medications today.  -- Safety: No safety issues identified in patient's home today and she reports "no falls."  Patient reports that she has continued having post-operative pain in her (R) lateral chest which she reports is well controlled by current pain medication regimen.  -- Home Health [place] [facility]) services:  [name] reports that she has continued actively participating in [name] services for PT and [name], through [name].  [name] reports that [name] continues "going well," but states that she had to miss today's [name] session due to having to go to the doctor.  -- Self-health management of COPD:  [name] states that she is "doing fine, no problems with my breathing," and not having any concerning signs/ symptoms from her baseline.  [name] states that she is in the green COPD zone today.     [name] denies further concerns, issues, problems or needs today.    Plan:   Patient will continue to take her medications as prescribed and attend all scheduled provider  appointments.    Patient will continue working with home health [name]/ PTservices as ordered post-Hernandez discharge.  Patient will continue reviewing COPD zones and corresponding action plans.  Patient will continue considering placement of Advanced Directives (AD), and will continue reviewing AD packet provided to her during last [name] in-home visit.  Patient will contact her providers for any new concerns, questions, problems, or issues.   [place] outreachfor transition of care to continue with scheduled phone call next week.  Felicia Rack, [name], [name], [name] [name] Care Management  [phone]

## 2016-06-08 ENCOUNTER — Telehealth: Payer: Self-pay | Admitting: *Deleted

## 2016-06-08 DIAGNOSIS — Z902 Acquired absence of lung [part of]: Secondary | ICD-10-CM

## 2016-06-08 NOTE — Telephone Encounter (Signed)
Oncology Nurse Navigator Documentation  Oncology Nurse Navigator Flowsheets 06/08/2016  Navigator Location [facility]  Referral date to RadOnc/MedOnc 05/31/2016  Navigator Encounter Type Introductory phone call;Telephone/I received referral on [name].  I called and updated her on [place] appt on 06/30/16.  She verbalized understanding of appt time and place.   Telephone Outgoing Call  Barriers/Navigation Needs Coordination of Care  Interventions Coordination of Care  Coordination of Care Appts  Acuity Level 1  Acuity Level 1 Initial guidance, education and coordination as needed  Time Spent with Patient 15

## 2016-06-09 DIAGNOSIS — Z7951 Long term (current) use of inhaled steroids: Secondary | ICD-10-CM | POA: Diagnosis not present

## 2016-06-09 DIAGNOSIS — I10 Essential (primary) hypertension: Secondary | ICD-10-CM | POA: Diagnosis not present

## 2016-06-09 DIAGNOSIS — M810 Age-related osteoporosis without current pathological fracture: Secondary | ICD-10-CM | POA: Diagnosis not present

## 2016-06-09 DIAGNOSIS — M1990 Unspecified osteoarthritis, unspecified site: Secondary | ICD-10-CM | POA: Diagnosis not present

## 2016-06-09 DIAGNOSIS — Z483 Aftercare following surgery for neoplasm: Secondary | ICD-10-CM | POA: Diagnosis not present

## 2016-06-09 DIAGNOSIS — J439 Emphysema, unspecified: Secondary | ICD-10-CM | POA: Diagnosis not present

## 2016-06-09 DIAGNOSIS — F329 Major depressive disorder, single episode, unspecified: Secondary | ICD-10-CM | POA: Diagnosis not present

## 2016-06-09 DIAGNOSIS — C3411 Malignant neoplasm of upper lobe, right bronchus or lung: Secondary | ICD-10-CM | POA: Diagnosis not present

## 2016-06-10 DIAGNOSIS — F329 Major depressive disorder, single episode, unspecified: Secondary | ICD-10-CM | POA: Diagnosis not present

## 2016-06-10 DIAGNOSIS — M810 Age-related osteoporosis without current pathological fracture: Secondary | ICD-10-CM | POA: Diagnosis not present

## 2016-06-10 DIAGNOSIS — Z483 Aftercare following surgery for neoplasm: Secondary | ICD-10-CM | POA: Diagnosis not present

## 2016-06-10 DIAGNOSIS — M1990 Unspecified osteoarthritis, unspecified site: Secondary | ICD-10-CM | POA: Diagnosis not present

## 2016-06-10 DIAGNOSIS — I10 Essential (primary) hypertension: Secondary | ICD-10-CM | POA: Diagnosis not present

## 2016-06-10 DIAGNOSIS — Z7951 Long term (current) use of inhaled steroids: Secondary | ICD-10-CM | POA: Diagnosis not present

## 2016-06-10 DIAGNOSIS — C3411 Malignant neoplasm of upper lobe, right bronchus or lung: Secondary | ICD-10-CM | POA: Diagnosis not present

## 2016-06-10 DIAGNOSIS — J439 Emphysema, unspecified: Secondary | ICD-10-CM | POA: Diagnosis not present

## 2016-06-13 DIAGNOSIS — Z483 Aftercare following surgery for neoplasm: Secondary | ICD-10-CM | POA: Diagnosis not present

## 2016-06-13 DIAGNOSIS — I10 Essential (primary) hypertension: Secondary | ICD-10-CM | POA: Diagnosis not present

## 2016-06-13 DIAGNOSIS — M1990 Unspecified osteoarthritis, unspecified site: Secondary | ICD-10-CM | POA: Diagnosis not present

## 2016-06-13 DIAGNOSIS — J439 Emphysema, unspecified: Secondary | ICD-10-CM | POA: Diagnosis not present

## 2016-06-13 DIAGNOSIS — F329 Major depressive disorder, single episode, unspecified: Secondary | ICD-10-CM | POA: Diagnosis not present

## 2016-06-13 DIAGNOSIS — M810 Age-related osteoporosis without current pathological fracture: Secondary | ICD-10-CM | POA: Diagnosis not present

## 2016-06-13 DIAGNOSIS — Z7951 Long term (current) use of inhaled steroids: Secondary | ICD-10-CM | POA: Diagnosis not present

## 2016-06-13 DIAGNOSIS — C3411 Malignant neoplasm of upper lobe, right bronchus or lung: Secondary | ICD-10-CM | POA: Diagnosis not present

## 2016-06-15 ENCOUNTER — Other Ambulatory Visit: Payer: Self-pay | Admitting: *Deleted

## 2016-06-15 ENCOUNTER — Encounter: Payer: Self-pay | Admitting: *Deleted

## 2016-06-15 NOTE — Patient Outreach (Signed)
[facility]) Care Management [place] Telephone Outreach, Transition of Care day 29 06/15/2016  [name] Jan 10, 1941 [id number]  [place] an 75 y.o.femalereferred to [employer] for transition of care after recent hospitalization October 16-23, 2017 for (R) upper lobectomy. Patient has history including but not limited to COPD, HTN, HLD, CKD, and DVT. Patient was discharged home with home health [facility]) services for PT and RN. HIPAA/ identity verified.  Today, [name] reports that she is "doing great and feeling good."   -- Provider appointments: [name] reports that she has upcoming provider appointment with Dr. Earlie Hernandez at [facility], which is on same day as next scheduled [place] CM in-home visit.  [facility] CM in-home visit was re-scheduled, and patient verbalized plans to attend appointment at [facility].  -- Medications: [name] states that she has and is taking all of her medications as they are prescribed. [name] denied questions about her medications today.  [name] states that she has not had to take many of her pain medications, as she states her pain "is under control for the most part."  [name] states when she does take pain medications, that they "help."  -- Home Health [facility]) services: [name] reports that [facility] services for PT and RN, [name], stating, [name]   -- Self-health management of COPD: [name] states that she is "doing fine, no problems with my breathing," and not having any concerning signs/ symptoms from her baseline. [name] states that she is in the green COPD zone today.    [name] denies further concerns, issues, problems or needs today.   Plan:   Patient will continue to take her medications as prescribed and attend all scheduled provider appointments.   Patient will continue reviewing COPD zones and corresponding action plans.  Patient  will continue considering placement of Advanced Directives (AD), and will continue reviewing AD packet provided to her during last [place] in-home visit.  Patient will contact her providers for any new concerns, questions, problems, or issues.   [place] outreachfor transition of care to continue with scheduled phone callnext week.  Felicia Rack, RN, BSN, [employer] [facility] Care Management  [phone]

## 2016-06-20 ENCOUNTER — Encounter: Payer: Self-pay | Admitting: *Deleted

## 2016-06-20 ENCOUNTER — Other Ambulatory Visit: Payer: Self-pay | Admitting: *Deleted

## 2016-06-20 NOTE — Patient Outreach (Signed)
[place] [facility]) Care Management [place] Telephone Outreach, Transition of Care day 34 06/20/2016  [name] May 25, 1941 [id number]  [name] an 75 y.o.femalereferred to [place] for transition of care after recent hospitalization October 16-23, 2017 for (R) upper lobectomy. Patient has history including but not limited to COPD, HTN, HLD, CKD, and DVT. Patient was discharged home with home health [facility]) services for PT and RN. HIPAA/ identity verified.  Today, [name] reports that she is " still doing great and feeling good."   -- Provider appointments: [name] again reports that she has upcoming provider appointment with Dr. Earlie Server at [facility], and verbalized plans to attend appointment.  Patient states that she "fixed" her car recently and has been driving short distances without problems.    -- Medications: [name] states that she has and is taking all of her medications as they are prescribed. [name] denied questions about her medications today.  [name] states that she has not been in pain and not had to take her pain medications "for several days now."  [name] denies pain today.  -- Self-health management of COPD: [name] states that she is "doing fine, no problems with my breathing," and not having any concerning signs/ symptoms from her baseline. [name] states that she is in the greenCOPD zone today. [name] reports that she "had to use" her HHN "one time" last week, stating, "it fixed me right up-- and I haven't had any problems since then."  [name] denies further concerns, issues, problems or needs today, and we confirmed our previously scheduled in-home visit for next week.   Plan:   Patient will continue to take her medications as prescribed and attend all scheduled provider appointments.   Patient will continue reviewingCOPD zones and corresponding action plans.  Patient will continue considering placement of Advanced Directives  (AD), and will continue reviewing AD packet provided to her during last [name] in-home visit.  Patient will contact her providers for any new concerns, questions, problems, or issues.   [place] outreachto continue with routine home visitnext week.  Felicia Rack, RN, BSN, [employer] [facility] Care Management  [phone]

## 2016-06-24 ENCOUNTER — Telehealth: Payer: Self-pay | Admitting: *Deleted

## 2016-06-24 NOTE — Telephone Encounter (Signed)
Oncology Nurse Navigator Documentation  Oncology Nurse Navigator Flowsheets 06/24/2016  Navigator Location [facility]  Navigator Encounter Type Telephone/I called [name] to change her appt time on 06/30/16 to an earlier time.  She verbalized understanding of appt time and place.   Telephone Outgoing Call  Treatment Phase Other  Barriers/Navigation Needs Coordination of Care  Interventions Coordination of Care  Coordination of Care Appts  Acuity Level 1  Time Spent with Patient 15

## 2016-06-28 ENCOUNTER — Encounter: Payer: Self-pay | Admitting: *Deleted

## 2016-06-28 ENCOUNTER — Other Ambulatory Visit: Payer: Self-pay | Admitting: *Deleted

## 2016-06-28 NOTE — Patient Outreach (Signed)
[place] [facility]) Care Management  [place] Routine Home Visit 06/28/2016  [name] [birth year] [id number]  [name] is an 75 y.o. femalereferred to [place] for transition of care after recent hospitalization October 16-23, 2017 for (R) upper lobectomy. Patient has history including but not limited to COPD, HTN, HLD, CKD, and DVT. Patient was discharged home with home health [facility]) services for PT and RN, which is now complete. HIPAA/ identity verified in person with patient today.  Today, [name] reports that she is " still doing great and feeling good."   Reports that she has had a "few episodes of dizziness and heart fluttering over the last week."  -- Provider appointments: [name] reports that she has upcoming provider appointment with Dr. Earlie Server at [facility] on Thursday 06/30/16, and verbalized plans to attend appointment, stating she will drive herself.  Patient states that she "fixed" her car recently and has been driving short distances without problems.  Layanna verbalizes uncertainty about what to expect during appointment, and I encouraged her to ask questions during appointment and be fully engaged with staff.  -- Medications: [name] states that she has and is taking all of her medications as they are prescribed. Morning denied questions about her medications today. [name] states that she has not been in pain and not had to take her pain medications "very often."  [name] denies pain today.  Medication reconciliation was performed in patient's home today, and patient has a good general understnading of the purpose, dosing, and scheduling of her prescribed medications.  -- Self-health management of COPD: [name] states that she is "doing fine, no problems with my breathing," and not having any concerning signs/ symptoms from her baseline. [name] states that she is in the greenCOPD zone today, and is able to answer basic questions about COPD zones/ action  plans. [name] reports that she has had a "couple of episodes of dizziness and "heart fluttering" which have spontaneously resolved without intervention, after few minutes of onset.  Patient was advised to report these symptoms and to keep a written record of any future episodes for provider review should they continue, and patient agrees to do so.  -- Advanced directives:  Patient reports that she has reviewed AD documents provided to her during her previous [facility] in-home visit, but states she is "not ready" to complete AD planning, reporting family dynamic issues between her children.  Benefit/ value of AD planning was again discussed.  [name] denies further concerns, issues, problems or needs today, and we scheduled in-home visit for next month.    Subjective: "I'm still doing real good.... I just don't know what to expect from my appointment with the cancer doctor..... I have started having some dizziness and heart fluttering that just goes away on it's own."  Objective:    BP 138/74   Pulse 97   Resp 16   SpO2 96%    Review of Systems  Constitutional: Negative.   Respiratory: Positive for shortness of breath. Negative for cough and wheezing.        Reports occasional SOB (baseline), "nothing any different."  Cardiovascular: Positive for palpitations. Negative for chest pain and leg swelling.       Patient reports several recent episodes where her "heart is all fluttery and I am dizzy."  Patient states this has happened 4-5 times over last week, and reports that "it just goes away on it's own."  Gastrointestinal: Negative for abdominal pain and nausea.  Musculoskeletal: Negative for falls.  Skin:       Healing (R) A/L chest incision, without redness or swelling;  Patient has small ~1 cm circular area under (R) breast that is open, without redness, swelling, or drainage  Neurological: Positive for dizziness.       Patient reports several recent episodes where her "heart is  all fluttery and I am dizzy."  Patient states this has happened 4-5 times over last week, and reports that "it just goes away on it's own."   Psychiatric/Behavioral: Negative for depression. The patient is not nervous/anxious.     Physical Exam  Constitutional: She is oriented to person, place, and time. She appears well-developed and well-nourished. No distress.    Cardiovascular: Normal rate, regular rhythm, normal heart sounds and intact distal pulses.   Pulses:      Radial pulses are 2+ on the right side, and 2+ on the left side.       Dorsalis pedis pulses are 2+ on the right side, and 2+ on the left side.  Respiratory: Effort normal and breath sounds normal. No respiratory distress. She has no wheezes. She has no rales. She exhibits tenderness.  Patient reports occasional chest tenderness after lobectomy; states "manageable with medication, doesn't happen every day."  GI: Soft. Bowel sounds are normal.  Musculoskeletal: She exhibits no edema.  Neurological: She is alert and oriented to person, place, and time.  Skin: Skin is warm and dry.  Psychiatric: She has a normal mood and affect. Her behavior is normal. Judgment and thought content normal.    Encounter Medications:   Outpatient Encounter Prescriptions as of 06/28/2016  Medication Sig  . albuterol (PROVENTIL HFA;VENTOLIN HFA) 108 (90 BASE) MCG/ACT inhaler Inhale 2 puffs into the lungs every 6 (six) hours as needed. (Patient taking differently: Inhale 2 puffs into the lungs every 4 (four) hours as needed for wheezing or shortness of breath. )  . albuterol (PROVENTIL) (2.5 MG/3ML) 0.083% nebulizer solution Take 3 mLs (2.5 mg total) by nebulization every 2 (two) hours as needed for wheezing or shortness of breath.  Marland Kitchen amLODipine (NORVASC) 2.5 MG tablet Take 5 mg by mouth at bedtime.   Marland Kitchen atorvastatin (LIPITOR) 20 MG tablet Take 20 mg by mouth daily.   . diazepam (VALIUM) 5 MG tablet Take 5 mg by mouth at bedtime as needed for  anxiety (for nerves).   . Fluticasone-Salmeterol (ADVAIR DISKUS) 250-50 MCG/DOSE AEPB Inhale 1 puff into the lungs every 12 (twelve) hours.  . gabapentin (NEURONTIN) 100 MG capsule Take 200 mg by mouth every morning.   . Glucosamine HCl (GLUCOSAMINE PO) Take 1 tablet by mouth daily.  Marland Kitchen HYDROcodone-acetaminophen (NORCO/VICODIN) 5-325 MG tablet Take 1 tablet by mouth every 4 (four) hours as needed for moderate pain.  Marland Kitchen lisinopril (PRINIVIL,ZESTRIL) 30 MG tablet Take 30 mg by mouth every morning.  . Magnesium 250 MG TABS Take 250 mg by mouth daily.  . metoCLOPramide (REGLAN) 5 MG tablet Take 5 mg by mouth 2 (two) times daily.   Marland Kitchen oxyCODONE (OXY IR/ROXICODONE) 5 MG immediate release tablet Take 1 tablet (5 mg total) by mouth every 6 (six) hours as needed for severe pain.  . pantoprazole (PROTONIX) 40 MG tablet Take 1 tablet (40 mg total) by mouth daily at 6 (six) AM.  . raloxifene (EVISTA) 60 MG tablet Take 60 mg by mouth daily.  . sertraline (ZOLOFT) 100 MG tablet Take 100 mg by mouth daily.  Marland Kitchen tiotropium (SPIRIVA) 18 MCG inhalation capsule Place  18 mcg into inhaler and inhale daily.     No facility-administered encounter medications on file as of 06/28/2016.     Functional Status:   In your present state of health, do you have any difficulty performing the following activities: 05/31/2016 05/09/2016  Hearing? N N  Vision? N N  Difficulty concentrating or making decisions? N N  Walking or climbing stairs? N N  Dressing or bathing? N N  Doing errands, shopping? N N  Preparing Food and eating ? N -  Using the Toilet? N -  In the past six months, have you accidently leaked urine? N -  Do you have problems with loss of bowel control? N -  Managing your Medications? N -  Managing your Finances? N -  Housekeeping or managing your Housekeeping? N -  Some recent data might be hidden    Fall/Depression Screening:    PHQ 2/9 Scores 05/31/2016 03/18/2016 09/30/2015 08/17/2015 07/13/2015 05/05/2015  05/02/2015  PHQ - 2 Score 0 0 '1 1 1 6 '$ 0  PHQ- 9 Score - - - - - 12 -    Assessment:  Marnesha continues to recuperate after her recent surgery, but has begun experiencing occasional episodes of dizziness and "heart fluttering."  Jone has remained committed to keeping all scheduled provider appointments and to compliance in general with her medical plan of care, and is apprehensive about uncertainty around what to expect with upcoming oncology provider appointment.  Vylette has reviewed Advanced Directives packet and is not ready to act to develop her personal Advanced Directives.    Plan:   Patient will continue to take her medications as prescribed and attend all scheduled provider appointments.   Patient will record/ track episodes of dizziness and heart fluttering, and will discuss with her providers.   Patient will contact her providers for any new concerns, questions, problems, or issues.   [place] outreachto continue with routine home visitnext month.   [name], RN, BSN, [employer] [facility] Care Management  [phone]

## 2016-06-29 ENCOUNTER — Encounter: Payer: Self-pay | Admitting: *Deleted

## 2016-06-30 ENCOUNTER — Ambulatory Visit: Payer: Commercial Managed Care - HMO | Admitting: Internal Medicine

## 2016-06-30 ENCOUNTER — Ambulatory Visit: Payer: Commercial Managed Care - HMO | Admitting: Physical Therapy

## 2016-06-30 ENCOUNTER — Other Ambulatory Visit (HOSPITAL_BASED_OUTPATIENT_CLINIC_OR_DEPARTMENT_OTHER): Payer: Commercial Managed Care - HMO

## 2016-06-30 ENCOUNTER — Ambulatory Visit: Payer: Commercial Managed Care - HMO | Admitting: *Deleted

## 2016-07-01 ENCOUNTER — Encounter: Payer: Self-pay | Admitting: *Deleted

## 2016-07-01 ENCOUNTER — Other Ambulatory Visit: Payer: Self-pay | Admitting: *Deleted

## 2016-07-01 NOTE — Patient Outreach (Signed)
[facility]) Care Management [place] Patient Telephone [facility] Coordination Outreach x 2 07/01/2016  [name] [birth year] [id number]  Successful telephone outreach to [name], 75 y.o. femalereferred to [place] for transition of care after recent hospitalization October 16-23, 2017 for (R) upper lobectomy. Patient has history including but not limited to COPD, HTN, HLD, CKD, and DVT. Patient was discharged home with home health [facility]) services for PT and RN, which is now complete. HIPAA/ identity verified with patient by phone today.  Received voicemail message from patient yesterday stating that she had attended her previously scheduled oncology appointment 06/30/16, and was told she had arrived 5 hours too early; patient left voicemail message informing me of this.  Care Coordination telephone outreach was placed (voicemail left) to Norton Blizzard, RN oncology navigator regarding patient's confusion around appointment yesterday.  Hinton Dyer returned my call and confirmed that patient visit had been re-scheduled for July 28, 2016 with an arrival time of 12:30 pm.  I placed 2 follow up calls to patient to confirm this appointment, and patient voiced plans to attend appointment on July 28, 2016 at 12:30 pm.  I re-scheduled previously scheduled [place] in-home visit with patient from July 28, 2016 to Wednesday August 03, 2016, so there was no conflict with patient attending oncology appointment.  Patient and I reviewed all upcoming provider appointments today during our phone call, and patient verbalized understanding and stated plans to attend all scheduled upcoming provider appointments.  Patient denies further questions, concerns, issues or problems today.   Plan:  Patient will continue to take her medications as prescribed and attend all scheduled provider appointments.   Patient will record/ track episodes of dizziness and  heart fluttering, and will discuss with her providers.   Patient will contact her providers for any new concerns, questions, problems, or issues.   [facility] outreachto continuewith routine home visitnext month.   [name], RN, BSN, [employer] [facility] Care Management  [phone]

## 2016-07-04 ENCOUNTER — Telehealth: Payer: Self-pay | Admitting: *Deleted

## 2016-07-04 NOTE — Telephone Encounter (Signed)
Oncology Nurse Navigator Documentation  Oncology Nurse Navigator Flowsheets 06/24/2016  Navigator Location [facility]  Navigator Encounter Type Telephone/I called [name] to follow up from her missed appt.  She is doing well.  I gave her an appt to be seen on 07/29/15 arrive at 12:30.  She verbalized understanding of appt time and place.   Telephone Outgoing Call  Treatment Phase Other  Barriers/Navigation Needs Coordination of Care  Interventions Coordination of Care  Coordination of Care Appts  Acuity Level 1  Time Spent with Patient 15

## 2016-07-14 DIAGNOSIS — J069 Acute upper respiratory infection, unspecified: Secondary | ICD-10-CM | POA: Diagnosis not present

## 2016-07-27 ENCOUNTER — Other Ambulatory Visit: Payer: Self-pay | Admitting: *Deleted

## 2016-07-27 ENCOUNTER — Encounter: Payer: Self-pay | Admitting: *Deleted

## 2016-07-27 ENCOUNTER — Other Ambulatory Visit: Payer: Self-pay | Admitting: Medical Oncology

## 2016-07-27 DIAGNOSIS — C3490 Malignant neoplasm of unspecified part of unspecified bronchus or lung: Secondary | ICD-10-CM

## 2016-07-27 NOTE — Patient Outreach (Signed)
[place]) Care Management [place] Telephone Outreach 07/27/2016  [name] [birth year] [id number]  Unsuccessful telephone outreach x 2 to [facility], 76 y.o.femalereferred to [place] for transition of care after recent hospitalization October 16-23, 2017 for (R) upper lobectomy. Patient has history including but not limited to COPD, HTN, HLD, CKD, and DVT. Patient was discharged home with home health [facility]) services for PT and RN, which is now complete.   Patient had previously requested that I contact her to remind her of her upcoming oncology/ [place] appointments.    HIPAA compliant voice mail message left for patient on her home number reminding her of appointments as she requested; attempt to contact patient on her alternate/ cell number was unsuccessful, and I was unable to leave patient a voice mail message on that number, received automated outgoing voicemail message stating that the voicemail for that number had not yet been set up.  Plan:  [place] routine home visit scheduled for next week.   [name], RN, BSN, [employer] [facility] Care Management  [phone]

## 2016-07-27 NOTE — Patient Outreach (Signed)
[facility]) Care Management [place] Telephone Outreach 07/27/2016  NIXIE LAUBE 02-12-1941 [id number]  Successful telephone outreach x 2 to Deanne Coffer y.o.femalereferred to [place] for transition of care after recent hospitalization October 16-23, 2017 for (R) upper lobectomy. Patient has history including but not limited to COPD, HTN, HLD, CKD, and DVT. Patient was discharged home with home health [facility]) services for PT and RN, which is now complete. Patient returned my call from earlier today, HIPAA verified during phone call today.  Earla had previously requested that I contact her to remind her of her upcoming oncology/ [place] appointments.  Naeemah reports that she is planning on attending tomorrow's oncology appointments, next week's surgical follow up appointments, stating that she will driver herself to these appointments.  We confirmed our previously scheduled appointment for [place] in-home visit next week during today's phone call.  Valeta reports today that she "is doing pretty good," stating that she has continued having "only a few spells" of "heart fluttering/ dizziness," stating, that "they hardly ever happen anymore."  Nelma was again encouraged to report these symptoms to her medical providers, which she agreed to do.  Semone also reports intermittent "pinching type pain on my side where they did the surgery," stating that she has had to take her pain medication "every couple of days."  Nneka denies pain today, but agrees to report this symptom as well, stating that the pain medication she has taken "helps and it goes away."  Tanashia denies shortness of breath, stating that her "breathing seems to be fine."  Thurley denies further concerns, issues, problems, or questions today.  Plan:  Patient will continue to take her medications as prescribed and attend all scheduled provider appointments.   Patient will record/ track  episodes of post-surgical pain, occasional dizziness and heart fluttering, and will discuss with her providers.  Patient will contact her providers for any new concerns, questions, problems, or issues.   [place] routine home visit scheduled for next week.    [name], RN, BSN, [employer] [facility] Care Management  [phone]

## 2016-07-28 ENCOUNTER — Ambulatory Visit: Payer: Medicare HMO | Attending: Internal Medicine | Admitting: Physical Therapy

## 2016-07-28 ENCOUNTER — Encounter: Payer: Self-pay | Admitting: *Deleted

## 2016-07-28 ENCOUNTER — Telehealth: Payer: Self-pay | Admitting: Internal Medicine

## 2016-07-28 ENCOUNTER — Ambulatory Visit (HOSPITAL_BASED_OUTPATIENT_CLINIC_OR_DEPARTMENT_OTHER): Payer: Medicare HMO | Admitting: Internal Medicine

## 2016-07-28 ENCOUNTER — Other Ambulatory Visit (HOSPITAL_BASED_OUTPATIENT_CLINIC_OR_DEPARTMENT_OTHER): Payer: Medicare HMO

## 2016-07-28 ENCOUNTER — Encounter: Payer: Self-pay | Admitting: Internal Medicine

## 2016-07-28 ENCOUNTER — Ambulatory Visit: Payer: Commercial Managed Care - HMO | Admitting: *Deleted

## 2016-07-28 VITALS — BP 143/80 | HR 90 | Temp 98.1°F | Resp 22 | Ht 62.0 in | Wt 152.8 lb

## 2016-07-28 DIAGNOSIS — R29898 Other symptoms and signs involving the musculoskeletal system: Secondary | ICD-10-CM

## 2016-07-28 DIAGNOSIS — J439 Emphysema, unspecified: Secondary | ICD-10-CM

## 2016-07-28 DIAGNOSIS — C3411 Malignant neoplasm of upper lobe, right bronchus or lung: Secondary | ICD-10-CM

## 2016-07-28 DIAGNOSIS — Z902 Acquired absence of lung [part of]: Secondary | ICD-10-CM

## 2016-07-28 DIAGNOSIS — R0789 Other chest pain: Secondary | ICD-10-CM

## 2016-07-28 DIAGNOSIS — R293 Abnormal posture: Secondary | ICD-10-CM | POA: Insufficient documentation

## 2016-07-28 DIAGNOSIS — C3491 Malignant neoplasm of unspecified part of right bronchus or lung: Secondary | ICD-10-CM

## 2016-07-28 DIAGNOSIS — C3490 Malignant neoplasm of unspecified part of unspecified bronchus or lung: Secondary | ICD-10-CM

## 2016-07-28 HISTORY — DX: Malignant neoplasm of unspecified part of right bronchus or lung: C34.91

## 2016-07-28 LAB — CBC WITH DIFFERENTIAL/PLATELET
BASO%: 0.8 % (ref 0.0–2.0)
Basophils Absolute: 0.1 10*3/uL (ref 0.0–0.1)
EOS%: 2.2 % (ref 0.0–7.0)
Eosinophils Absolute: 0.1 10*3/uL (ref 0.0–0.5)
HCT: 32.6 % — ABNORMAL LOW (ref 34.8–46.6)
HGB: 10.4 g/dL — ABNORMAL LOW (ref 11.6–15.9)
LYMPH%: 23.5 % (ref 14.0–49.7)
MCH: 25.4 pg (ref 25.1–34.0)
MCHC: 31.8 g/dL (ref 31.5–36.0)
MCV: 79.8 fL (ref 79.5–101.0)
MONO#: 0.6 10*3/uL (ref 0.1–0.9)
MONO%: 9.4 % (ref 0.0–14.0)
NEUT#: 4.1 10*3/uL (ref 1.5–6.5)
NEUT%: 64.1 % (ref 38.4–76.8)
Platelets: 301 10*3/uL (ref 145–400)
RBC: 4.09 10*6/uL (ref 3.70–5.45)
RDW: 16.3 % — ABNORMAL HIGH (ref 11.2–14.5)
WBC: 6.4 10*3/uL (ref 3.9–10.3)
lymph#: 1.5 10*3/uL (ref 0.9–3.3)

## 2016-07-28 LAB — COMPREHENSIVE METABOLIC PANEL
ALT: 7 U/L (ref 0–55)
AST: 14 U/L (ref 5–34)
Albumin: 3.5 g/dL (ref 3.5–5.0)
Alkaline Phosphatase: 74 U/L (ref 40–150)
Anion Gap: 10 mEq/L (ref 3–11)
BUN: 9 mg/dL (ref 7.0–26.0)
CO2: 24 mEq/L (ref 22–29)
Calcium: 8.9 mg/dL (ref 8.4–10.4)
Chloride: 107 mEq/L (ref 98–109)
Creatinine: 1.1 mg/dL (ref 0.6–1.1)
EGFR: 48 mL/min/{1.73_m2} — ABNORMAL LOW (ref >90)
Glucose: 98 mg/dl (ref 70–140)
Potassium: 3.4 mEq/L — ABNORMAL LOW (ref 3.5–5.1)
Sodium: 141 mEq/L (ref 136–145)
Total Bilirubin: 0.41 mg/dL (ref 0.20–1.20)
Total Protein: 7.4 g/dL (ref 6.4–8.3)

## 2016-07-28 NOTE — Progress Notes (Signed)
[place] Clinical Social Work  Clinical Social Work met with patient at [facility] appointment to offer support and assess for psychosocial needs.  The patient scored a 0 on distress.  She reported no concerns at this time and is "feeling good that surgery worked out".  The patient lives alone and has two dogs which provide her accompaniment.     Clinical Social Work briefly discussed Clinical Social Work role and [employer] support programs/services.  Clinical Social Work encouraged patient to call with any additional questions or concerns.   Polo Riley, MSW, LCSW, OSW-C Clinical Social Worker [facility] [phone]

## 2016-07-28 NOTE — Progress Notes (Signed)
Oncology Nurse Navigator Documentation  Oncology Nurse Navigator Flowsheets 07/28/2016  Navigator Location [facility]  Navigator Encounter Type Clinic/MDC/spoke with [name] today.  She is doing well without complaints.  Gave and explained information on lung cancer, resources and support services and [facility], and next steps.    Abnormal Finding Date 04/06/2016  Confirmed Diagnosis Date 05/09/2016  Multidisiplinary Clinic Date 07/28/2016  Patient Visit Type MedOnc  Treatment Phase Other  Barriers/Navigation Needs Education  Education Other  Interventions Education  Education Method Verbal;Written  Acuity Level 1  Time Spent with Patient 30

## 2016-07-28 NOTE — Progress Notes (Signed)
[place] [phone]   [phone] Multidisciplinary thoracic oncology clinic CONSULT NOTE  REFERRING PHYSICIAN: Dr. Modesto Charon  REASON FOR CONSULTATION:  76 years old white female recently diagnosed with lung cancer.  HPI [name] is a 76 y.o. female was past medical history significant for multiple medical problems including COPD, hypertension, dyslipidemia, history of uterine cancer, GERD, anxiety and depression as well as the venous thrombosis of the left lower extremity in 2012 after hip surgery. The patient was seen by her primary care physician for routine evaluation. He ordered CT screening of the lung which was performed on 03/24/2016 and it showed a mixed solid and sub-solid lesion identified in the lateral aspect of the right upper lobe measuring 2.2 cm. There was numerous other scattered small pulmonary nodules seen bilaterally. The patient had a PET scan on 04/05/2016 and it showed 2.0 x 1.8 cm solid and sub-solid lesion of the right upper lobe with maximum SUV of 5.9 compatible with malignancy. There is no hypermetabolic adenopathy or distant metastatic disease identified. The patient was referred to Dr. Roxan Hockey and on 05/09/2016 she underwent right VATS, and right upper lobectomy.  The final pathology ([id number]) showed invasive adenocarcinoma, well-differentiated spanning 1.2 cm. The surgical resection margins were negative for carcinoma. The dissected lymph nodes were negative for malignancy. Dr. Roxan Hockey kindly referred the patient to the multidisciplinary thoracic oncology clinic today for further evaluation and recommendation regarding her condition after resection. When seen today she is feeling fine except for soreness on the right side of the chest from the surgical scar as well as shortness of breath and mild cough productive of whitish sputum. She denied having any hemoptysis. She takes oxycodone for her  right-sided chest soreness. She denied having any weight loss or night sweats. She has no nausea, vomiting, diarrhea or constipation. She denied having any headaches but she has some visual changes. Family history significant for mother with uterine cancer father had heart disease and sister had ovarian cancer as well as head and neck cancer. The patient is single. She has 3 children 2 sons and 1 daughter. She worked in several jobs in the past. She has a history of smoking up to 2 pack per day for around 40 years and quit 6 years ago. She has no history of alcohol or drug abuse.  HPI  Past Medical History:  Diagnosis Date  . Anxiety   . Cancer ([place])    uterine  . COPD (chronic obstructive pulmonary disease) ([place])   . Cyst    left side of neck  . Depression   . GERD (gastroesophageal reflux disease)   . Heart murmur    per patient  . Hyperlipidemia   . Hypertension   . Low back pain   . Lumbar back pain   . Osteoarthritis   . Osteoporosis     Past Surgical History:  Procedure Laterality Date  . ANKLE SURGERY Right    horse accident  . APPENDECTOMY    . BACK SURGERY    . CHOLECYSTECTOMY    . EYE SURGERY     lense implant  . FRACTURE SURGERY    . HIP SURGERY  01/2010   Left Hip   . INNER EAR SURGERY Bilateral 1970   related to severe ear infections  . KNEE SURGERY     Right knee  . LOBECTOMY Right 05/09/2016   Procedure: RIGHT UPPER LOBECTOMY;  Surgeon: [name], MD;  Location: [facility];  Service: Thoracic;  Laterality: Right;  . MASS EXCISION  08/25/2011   Procedure: EXCISION MASS;  Surgeon: Harl Bowie, MD;  Location: [facility];  Service: General;  Laterality: N/A;  excision left neck mass  . TOTAL ABDOMINAL HYSTERECTOMY    . VIDEO ASSISTED THORACOSCOPY (VATS)/WEDGE RESECTION Right 05/09/2016   Procedure: VIDEO ASSISTED THORACOSCOPY (VATS)/WEDGE RESECTION;  Surgeon: [name], MD;  Location: [facility];  Service: Thoracic;   Laterality: Right;    Family History  Problem Relation Age of Onset  . Heart disease Mother   . Heart disease Father   . Cancer Sister     #1, breast  . Heart disease Brother     had CABG    Social History Social History  Substance Use Topics  . Smoking status: Former Smoker    Packs/day: 2.00    Years: 51.00    Types: Cigarettes    Quit date: 05/04/2012  . Smokeless tobacco: Never Used  . Alcohol use No    Allergies  Allergen Reactions  . Boniva [Ibandronate Sodium] Other (See Comments)    UNSPECIFIED REACTION   . Nitrofurantoin Monohyd Macro Rash    Current Outpatient Prescriptions  Medication Sig Dispense Refill  . albuterol (PROVENTIL HFA;VENTOLIN HFA) 108 (90 BASE) MCG/ACT inhaler Inhale 2 puffs into the lungs every 6 (six) hours as needed. (Patient taking differently: Inhale 2 puffs into the lungs every 4 (four) hours as needed for wheezing or shortness of breath. ) 18 g 1  . albuterol (PROVENTIL) (2.5 MG/3ML) 0.083% nebulizer solution Take 3 mLs (2.5 mg total) by nebulization every 2 (two) hours as needed for wheezing or shortness of breath. 75 mL 12  . amLODipine (NORVASC) 2.5 MG tablet Take 5 mg by mouth at bedtime.     Marland Kitchen atorvastatin (LIPITOR) 20 MG tablet Take 20 mg by mouth daily.     . diazepam (VALIUM) 5 MG tablet Take 5 mg by mouth at bedtime as needed for anxiety (for nerves).     . Fluticasone-Salmeterol (ADVAIR DISKUS) 250-50 MCG/DOSE AEPB Inhale 1 puff into the lungs every 12 (twelve) hours. 60 each 0  . gabapentin (NEURONTIN) 100 MG capsule Take 200 mg by mouth every morning.     . Glucosamine HCl (GLUCOSAMINE PO) Take 1 tablet by mouth daily.    Marland Kitchen lisinopril (PRINIVIL,ZESTRIL) 30 MG tablet Take 30 mg by mouth every morning.    . Magnesium 250 MG TABS Take 250 mg by mouth daily.    . metoCLOPramide (REGLAN) 5 MG tablet Take 5 mg by mouth 2 (two) times daily.     Marland Kitchen oxyCODONE (OXY IR/ROXICODONE) 5 MG immediate release tablet Take 1 tablet (5 mg total) by  mouth every 6 (six) hours as needed for severe pain. 30 tablet 0  . pantoprazole (PROTONIX) 40 MG tablet Take 1 tablet (40 mg total) by mouth daily at 6 (six) AM. 30 tablet 0  . raloxifene (EVISTA) 60 MG tablet Take 60 mg by mouth daily.    . sertraline (ZOLOFT) 100 MG tablet Take 100 mg by mouth daily.    Marland Kitchen tiotropium (SPIRIVA) 18 MCG inhalation capsule Place 18 mcg into inhaler and inhale daily.       No current facility-administered medications for this visit.     Review of Systems  Constitutional: positive for fatigue Eyes: negative Ears, nose, mouth, throat, and face: negative Respiratory: positive for cough and dyspnea on exertion Cardiovascular: negative Gastrointestinal: negative Genitourinary:negative Integument/breast: negative Hematologic/lymphatic: negative Musculoskeletal:negative  Neurological: negative Behavioral/Psych: negative Endocrine: negative Allergic/Immunologic: negative  Physical Exam  KXF:GHWEX, healthy, no distress, well nourished and well developed SKIN: skin color, texture, turgor are normal, no rashes or significant lesions HEAD: Normocephalic, No masses, lesions, tenderness or abnormalities EYES: normal, PERRLA, Conjunctiva are pink and non-injected EARS: External ears normal, Canals clear OROPHARYNX:no exudate, no erythema and lips, buccal mucosa, and tongue normal  NECK: supple, no adenopathy, no JVD LYMPH:  no palpable lymphadenopathy, no hepatosplenomegaly BREAST:not examined LUNGS: clear to auscultation , and palpation HEART: regular rate & rhythm, no murmurs and no gallops ABDOMEN:abdomen soft, non-tender, obese, normal bowel sounds and no masses or organomegaly BACK: Back symmetric, no curvature., No CVA tenderness EXTREMITIES:no joint deformities, effusion, or inflammation, no edema, no skin discoloration  NEURO: alert & oriented x 3 with fluent speech, no focal motor/sensory deficits  PERFORMANCE STATUS: ECOG 1  LABORATORY DATA: Lab  Results  Component Value Date   WBC 6.4 07/28/2016   HGB 10.4 (L) 07/28/2016   HCT 32.6 (L) 07/28/2016   MCV 79.8 07/28/2016   PLT 301 07/28/2016      Chemistry      Component Value Date/Time   NA 141 07/28/2016 1240   K 3.4 (L) 07/28/2016 1240   CL 108 05/14/2016 0500   CO2 24 07/28/2016 1240   BUN 9.0 07/28/2016 1240   CREATININE 1.1 07/28/2016 1240      Component Value Date/Time   CALCIUM 8.9 07/28/2016 1240   ALKPHOS 74 07/28/2016 1240   AST 14 07/28/2016 1240   ALT 7 07/28/2016 1240   BILITOT 0.41 07/28/2016 1240       RADIOGRAPHIC STUDIES: No results found.  ASSESSMENT:This is a very pleasant 76 years old white female recently diagnosed with a stage IA (T1a, N0, M0) non-small cell lung cancer, adenocarcinoma presented with right upper lobe pulmonary nodule is status post right upper lobectomy with lymph node dissection on 05/09/2016.   PLAN: I had a discussion with the patient today about her current disease stage, prognosis and treatment options. I explained to the patient that the 5 year survival for patient with a stage IA is around 80%. I explained to the patient that there is no survival benefit for adjuvant systemic chemotherapy or radiation for patient with a stage IA non-small cell lung cancer. I recommended for her to continue on observation with repeat CT scan of the chest in 6 months for the first 2 years then annually after that for at least 5 years. The patient was seen during the multidisciplinary thoracic oncology clinic today by medical oncology, thoracic navigator, social worker and physical therapist. For the right-sided chest pain secondary to the surgical scar, she will continue with oxycodone for now and she may consider treatment with Tylenol or ibuprofen if needed in the future. She was advised to call immediately if she has any concerning symptoms in the interval. The patient voices understanding of current disease status and treatment options  and is in agreement with the current care plan.  All questions were answered. The patient knows to call the clinic with any problems, questions or concerns. We can certainly see the patient much sooner if necessary.  Thank you so much for allowing me to participate in the care of WESTLYN GLAZA. I will continue to follow up the patient with you and assist in her care.  I spent 40 minutes counseling the patient face to face. The total time spent in the appointment was 60 minutes.  Disclaimer: This  note was dictated with voice recognition software. Similar sounding words can inadvertently be transcribed and may not be corrected upon review.   MOHAMED,MOHAMED K. July 28, 2016, 1:52 PM

## 2016-07-28 NOTE — Telephone Encounter (Signed)
Gave patient avs report and appointments for July. Central radiology will call re scan.  °

## 2016-07-28 NOTE — Therapy (Addendum)
[place], [place], [address] Phone: [phone]   Fax:  [phone]  Physical Therapy Evaluation  Patient Details  Name: [name] MRN: [id number] Date of Birth: [birth year] Referring Provider: Dr. Curt Bears  Encounter Date: 07/28/2016      PT End of Session - 07/28/16 1433    Visit Number 1   Number of Visits 9   Date for PT Re-Evaluation 08/28/16   PT Start Time 1330   PT Stop Time 1352   PT Time Calculation (min) 22 min   Activity Tolerance Patient tolerated treatment well   Behavior During Therapy [facility] for tasks assessed/performed      Past Medical History:  Diagnosis Date  . Adenocarcinoma of right lung, stage 1 ([place]) 07/28/2016  . Anxiety   . Cancer ([place])    uterine  . COPD (chronic obstructive pulmonary disease) ([place])   . Cyst    left side of neck  . Depression   . GERD (gastroesophageal reflux disease)   . Heart murmur    per patient  . Hyperlipidemia   . Hypertension   . Low back pain   . Lumbar back pain   . Osteoarthritis   . Osteoporosis     Past Surgical History:  Procedure Laterality Date  . ANKLE SURGERY Right    horse accident  . APPENDECTOMY    . BACK SURGERY    . CHOLECYSTECTOMY    . EYE SURGERY     lense implant  . FRACTURE SURGERY    . HIP SURGERY  01/2010   Left Hip   . INNER EAR SURGERY Bilateral 1970   related to severe ear infections  . KNEE SURGERY     Right knee  . LOBECTOMY Right 05/09/2016   Procedure: RIGHT UPPER LOBECTOMY;  Surgeon: [name], MD;  Location: [facility];  Service: Thoracic;  Laterality: Right;  . MASS EXCISION  08/25/2011   Procedure: EXCISION MASS;  Surgeon: Harl Bowie, MD;  Location: [facility];  Service: General;  Laterality: N/A;  excision left neck mass  . TOTAL ABDOMINAL HYSTERECTOMY    . VIDEO ASSISTED THORACOSCOPY (VATS)/WEDGE RESECTION Right 05/09/2016   Procedure: VIDEO ASSISTED  THORACOSCOPY (VATS)/WEDGE RESECTION;  Surgeon: [name], MD;  Location: [facility];  Service: Thoracic;  Laterality: Right;    There were no vitals filed for this visit.       Subjective Assessment - 07/28/16 1422    Subjective "Did they get it all?"   Pertinent History Patient had low dose CT screen 03/25/16 with abnormal result.  This was followed by a PET scan and then VATS right upper lobectomy on 05/09/16 that identified invasive adenocarcinoma with negative margins and negative nodes.  She is expected just to have observation for follow-up.  HTN; COPD; LBP, OA, osteoporosis; h/o right ankle fracture with ORIF; back surgery with rods; left hip surgery 01/2010; right knee surgery; pelvic and coccyx fractures, per patient.   Patient Stated Goals Get info from all lung clinic providers   Currently in Pain? Yes   Pain Score 9   sometimes 0   Pain Location Flank   Pain Orientation Right;Mid   Pain Descriptors / Indicators Sharp  like a needle   Pain Type Surgical pain   Pain Onset More than a month ago   Pain Frequency Intermittent   Aggravating Factors  unpredictable   Pain Relieving Factors unsure  [facility] PT Assessment - 07/28/16 0001      Assessment   Medical Diagnosis right upper lobe invasive adenoCA, negative margins and nodes s/p VATS lobectomy 05/09/16   Referring Provider Dr. Curt Bears   Onset Date/Surgical Date 05/09/16   Prior Therapy none     Precautions   Precautions Other (comment)   Precaution Comments cancer precautions     Restrictions   Weight Bearing Restrictions No     Balance Screen   Has the patient fallen in the past 6 months No   Has the patient had a decrease in activity level because of a fear of falling?  No   Is the patient reluctant to leave their home because of a fear of falling?  No     Home Environment   Living Environment Private residence   Living Arrangements Alone   Type of [place] Stand to  enter   Entrance Stairs-Number of Steps [place] One level     Prior Function   Level of [place] does some leg strengthening exercises with a can in a bag for weight; does some walking, about 10-15 mins. daily     Cognition   Overall Cognitive Status Within Functional Limits for tasks assessed     Observation/Other Assessments   Observations small, energetic older woman, very outgoing and friendly.  Patient frequently stops short and grabs her right side with pain during the assessment today, whether just in conversation or during assessment of activity tolerance.     Functional Tests   Functional tests Sit to Stand     Sit to Stand   Comments 10 times in 30 seconds, about average for her age     Posture/Postural Control   Posture/Postural Control Postural limitations   Postural Limitations Forward head     ROM / Strength   AROM / PROM / Strength AROM     AROM   Overall AROM Comments standing trunk AROM:  flexion WFL; sidebend WFL bilat. but pain on right side with leaning left; rotation approx. 10% limited bilat. and painful right side going right     Palpation   Palpation comment some decreased soft tissue mobility at both right side incisions (both healed, about 4 inches long, one at lower breast level and one about four inches lower than that     Ambulation/Gait   Ambulation/Gait Yes   Ambulation/Gait Assistance 7: Independent   Gait Comments She does report some shortness of breath with walking since lobectomy.     Balance   Balance Assessed Yes     Dynamic Standing Balance   Dynamic Standing - Comments reaches forward 11 inches in standing, about average for her age                           PT Education - 07/28/16 1431    Education provided Yes   Education Details walking, posture, breathing, cough splinting, Cure article on staying active    Person(s) Educated Patient   Methods Explanation;Handout    Comprehension Verbalized understanding               Lung Clinic Goals - 07/28/16 1443      Patient will be able to verbalize understanding of the benefit of exercise to decrease fatigue.   Status Achieved     Patient will be able to verbalize the importance of posture.  Status Achieved     Patient will be able to demonstrate diaphragmatic breathing for improved lung function.   Status Achieved     Patient will be able to verbalize understanding of the role of physical therapy to prevent functional decline and who to contact if physical therapy is needed.   Status Achieved         Long Term Clinic Goals - 07/28/16 1444      CC Long Term Goal  #1   Title Patient will report at least 75% decrease in frequency and/or intensity of right flank pain.   Time 4   Period Weeks   Status New            Plan - 07/28/16 1433    Clinical Impression Statement Pt.'s case was discussed at multidisciplinary conference prior to assessment. Energetic, small woman s/p VATS lobectomy for adenocarcinoma (found on low dose CT screen) with negative margins and nodes; needs just observation for medical oncology follow-up. She has intermittent unpredictable pain in the area of her two incisions from VATS; this limits her active trunk ROM.  She does report some shortness of breath with walking.   Rehab Potential Good   PT Frequency 2x / week   PT Duration 4 weeks   PT Treatment/Interventions ADLs/Self Care Home Management;Electrical Stimulation;Moist Heat;Cryotherapy;Therapeutic exercise;Patient/family education;Manual techniques;Scar mobilization;Passive range of motion   PT Next Visit Plan Patient would benefit from therapy toward relief of her significant intermittent right flank pain at area of incisions from VATS.  Begin scar mobilization and myofascial release to scar areas; teach patient stretches to do for the right flank.   PT Home Exercise Plan walking program, diaphragmatic breathing    Consulted and Agree with Plan of Care Patient      Patient will benefit from skilled therapeutic intervention in order to improve the following deficits and impairments:  Cardiopulmonary status limiting activity, Decreased range of motion, Pain, Decreased scar mobility  Visit Diagnosis: Other symptoms and signs involving the musculoskeletal system - Plan: PT plan of care cert/re-cert  Abnormal posture - Plan: PT plan of care cert/re-cert      G-Codes - 16/10/96 1444    Functional Assessment Tool Used clinical judgement   Functional Limitation Other PT primary   Other PT Primary Current Status (E4540) At least 40 percent but less than 60 percent impaired, limited or restricted   Other PT Primary Goal Status (J8119) At least 1 percent but less than 20 percent impaired, limited or restricted       Problem List Patient Active Problem List   Diagnosis Date Noted  . Adenocarcinoma of right lung, stage 1 ([place]) 07/28/2016  . S/P lobectomy of lung 05/09/2016  . DVT (deep venous thrombosis) ([place]) 04/26/2016  . Heart murmur   . COPD exacerbation ([place]) 08/29/2015  . HTN (hypertension) 08/29/2015  . HLD (hyperlipidemia) 08/29/2015  . Chronic low back pain 08/29/2015  . CKD (chronic kidney disease), stage III 08/29/2015  . COPD (chronic obstructive pulmonary disease) ([place]) 04/27/2015  . Acute on chronic respiratory failure with hypoxia ([place]) 04/27/2015  . Infected sebaceous cyst 08/16/2011    [name] 07/28/2016, 2:52 PM  [place], [place], [address] Phone: [phone]   Fax:  [phone]  Name: [name] MRN: [id number] Date of Birth: 12-03-1940  Serafina Royals, PT 07/28/16 2:52 PM  PHYSICAL THERAPY DISCHARGE SUMMARY  Visits from Start of Care: 1  Current functional level related to goals /  functional outcomes: Lung clinic goals were met.  Additional goal was set for pain  reduction, but patient did not pursue follow-up therapy towards this goal, so that was not met.   Remaining deficits: Unknown, as patient did not do follow-up therapy with [place].   Education / Equipment:     PT Education - 07/28/16 1431    Education provided Yes   Education Details walking, posture, breathing, cough splinting, Cure article on staying active    Person(s) Educated Patient   Methods Explanation;Handout   Comprehension Verbalized understanding     Plan: Patient agrees to discharge.  Patient goals were partially met. Patient is being discharged due to not returning since the last visit.  ?????    Serafina Royals, PT 10/05/17 4:44 PM

## 2016-08-01 ENCOUNTER — Other Ambulatory Visit: Payer: Self-pay | Admitting: Thoracic Surgery (Cardiothoracic Vascular Surgery)

## 2016-08-01 DIAGNOSIS — Z902 Acquired absence of lung [part of]: Secondary | ICD-10-CM

## 2016-08-02 ENCOUNTER — Ambulatory Visit (INDEPENDENT_AMBULATORY_CARE_PROVIDER_SITE_OTHER): Payer: Self-pay | Admitting: Thoracic Surgery (Cardiothoracic Vascular Surgery)

## 2016-08-02 ENCOUNTER — Ambulatory Visit
Admission: RE | Admit: 2016-08-02 | Discharge: 2016-08-02 | Disposition: A | Discharge status: Home / Self Care | Payer: Medicare HMO | Source: Ambulatory Visit | Attending: Thoracic Surgery (Cardiothoracic Vascular Surgery) | Admitting: Thoracic Surgery (Cardiothoracic Vascular Surgery)

## 2016-08-02 VITALS — BP 145/74 | HR 94 | Resp 20 | Ht 62.0 in | Wt 151.0 lb

## 2016-08-02 DIAGNOSIS — G8918 Other acute postprocedural pain: Secondary | ICD-10-CM

## 2016-08-02 DIAGNOSIS — Z902 Acquired absence of lung [part of]: Secondary | ICD-10-CM

## 2016-08-02 DIAGNOSIS — C801 Malignant (primary) neoplasm, unspecified: Secondary | ICD-10-CM

## 2016-08-02 DIAGNOSIS — R918 Other nonspecific abnormal finding of lung field: Secondary | ICD-10-CM | POA: Diagnosis not present

## 2016-08-02 DIAGNOSIS — D381 Neoplasm of uncertain behavior of trachea, bronchus and lung: Secondary | ICD-10-CM

## 2016-08-02 IMAGING — CR DG CHEST 2V
2 series · 2 of 2 positions shown · non-contrast
Comparison: [DATE]

CLINICAL DATA: History of video-assisted thoracic surgery

EXAM:
CHEST  2 VIEW

[w chest pa]
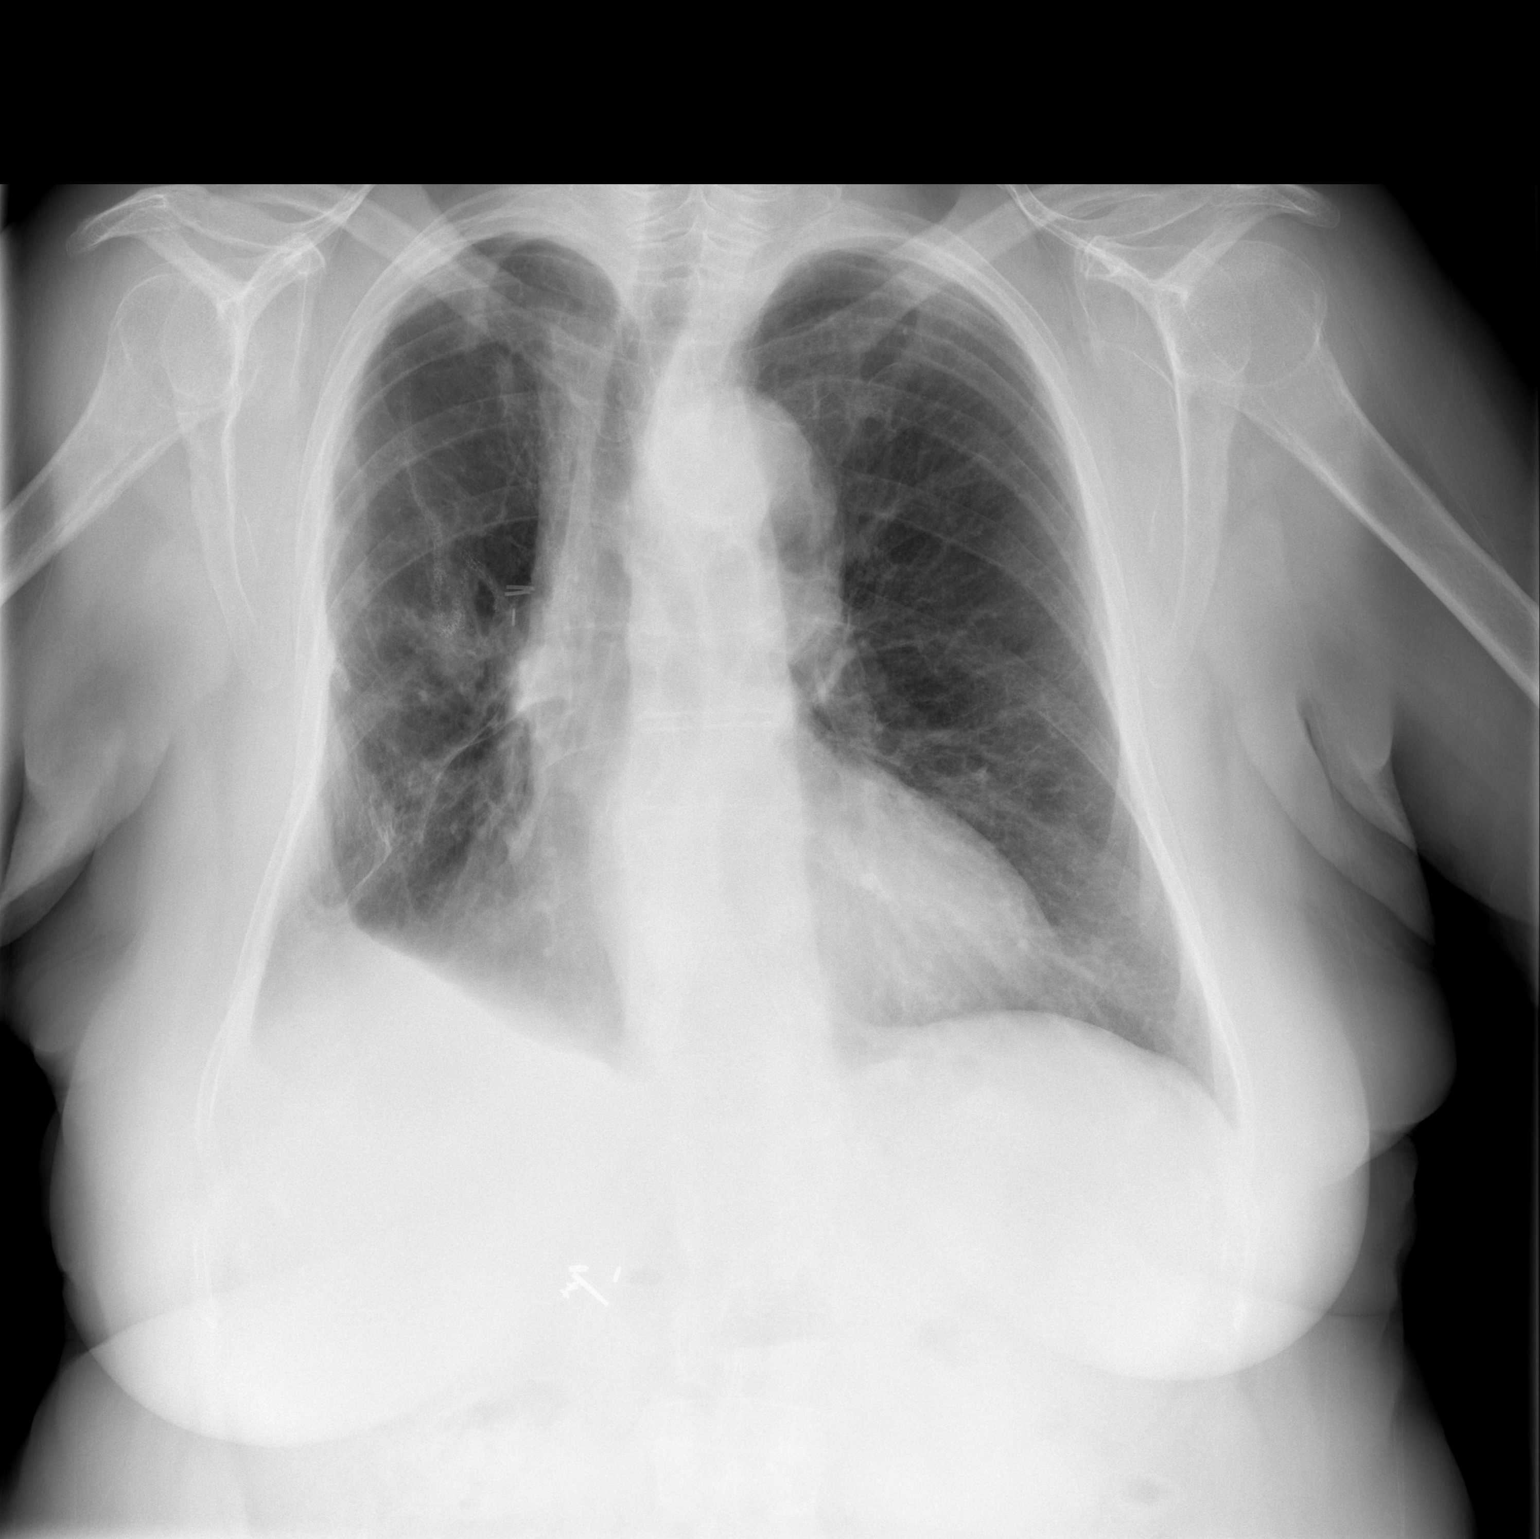

[w chest lat]
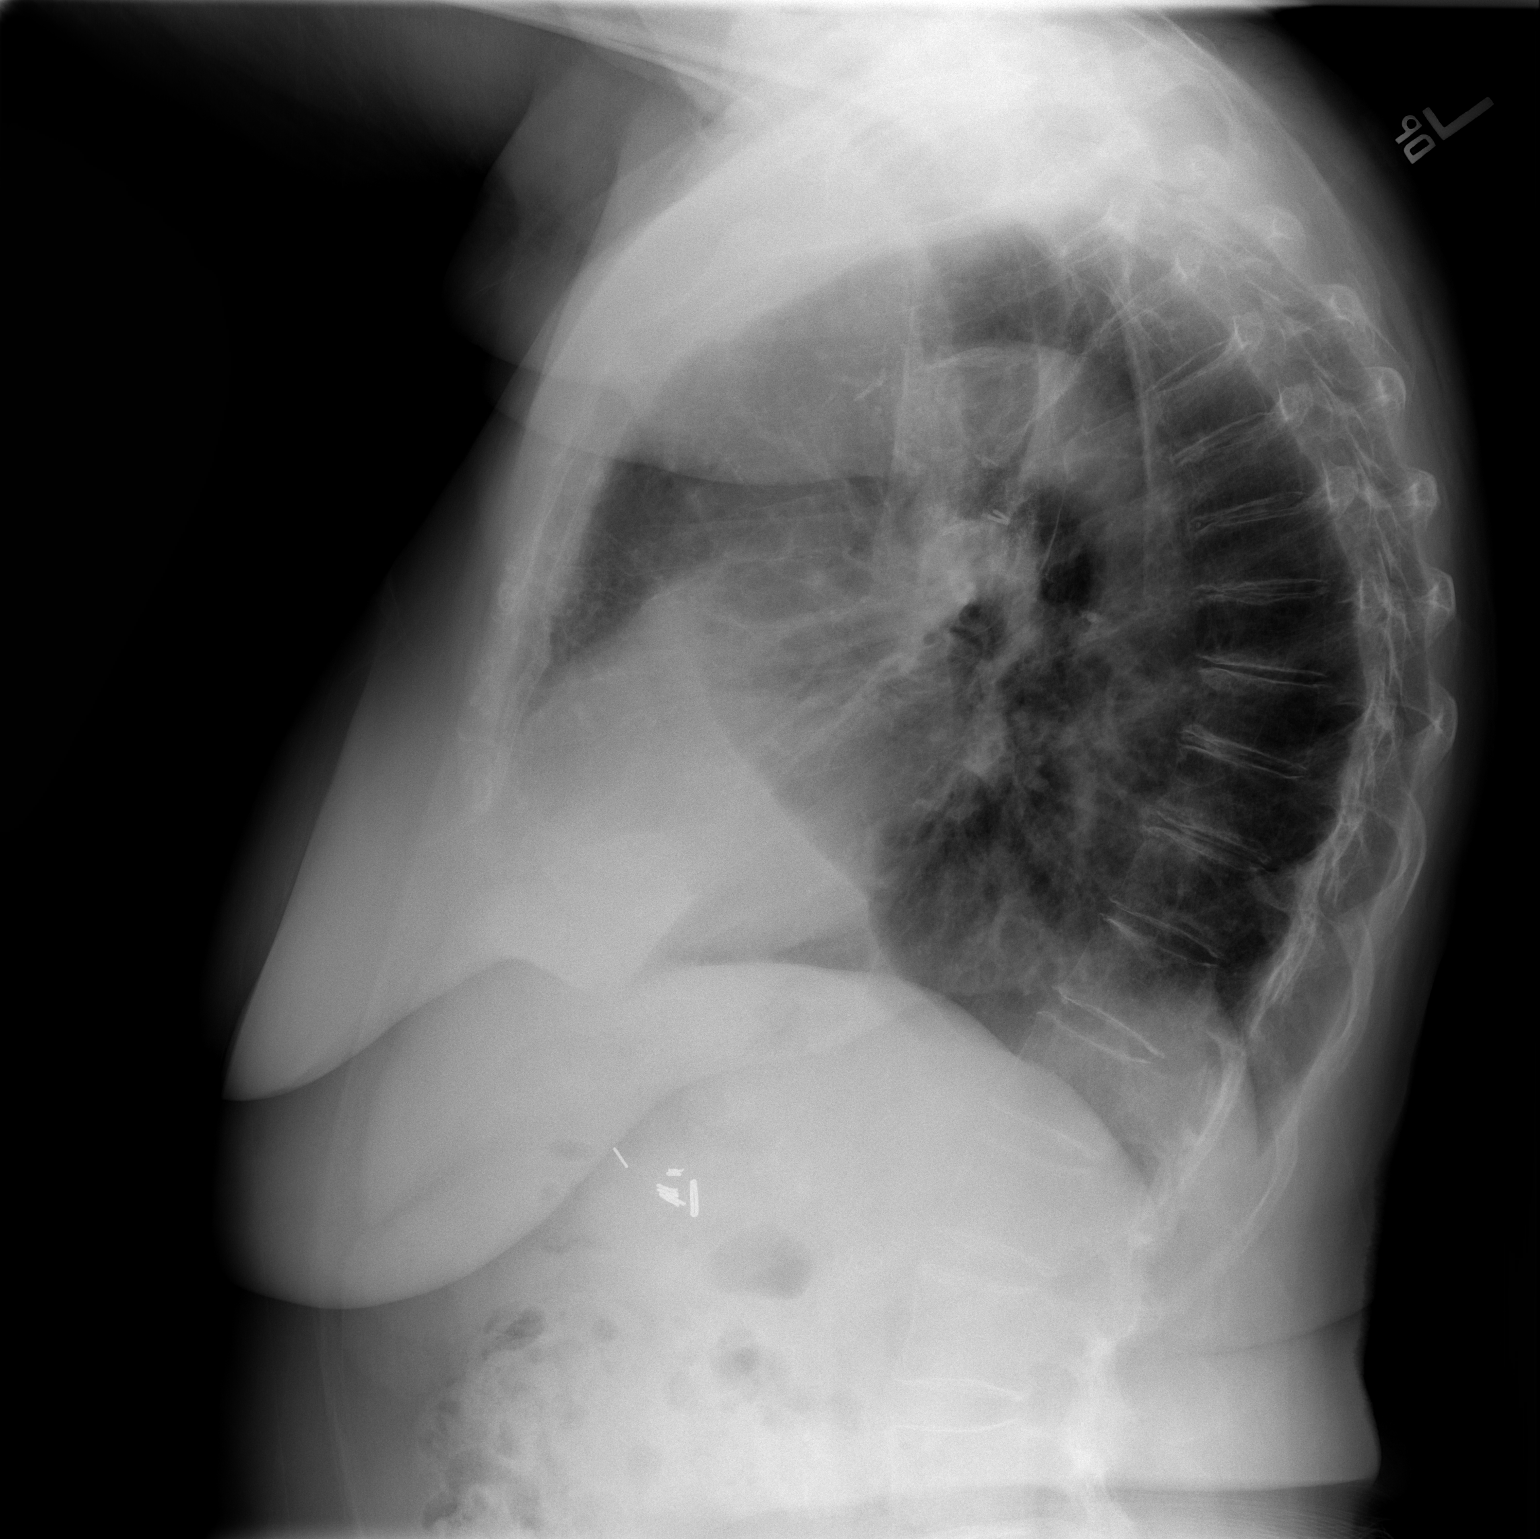

[2 of 2 positions shown; findings below may reference images not displayed]

FINDINGS: Moderate cardiomegaly. Opacities throughout the right lung
compatible with postoperative changes are stable. Pleural changes at
the right base are stable. Volume loss in the right hemithorax is
stable. Left lung is clear. Normal vascularity.
IMPRESSION: Stable postoperative changes.

## 2016-08-02 MED ORDER — GABAPENTIN 300 MG PO CAPS: 300.0000 mg | ORAL_CAPSULE | Freq: Three times a day (TID) | ORAL | 6 refills | Status: DC

## 2016-08-02 MED ORDER — OXYCODONE HCL 5 MG PO TABS: 5.0000 mg | ORAL_TABLET | Freq: Four times a day (QID) | ORAL | 0 refills | Status: DC | PRN

## 2016-08-02 NOTE — Progress Notes (Signed)
[address]       [place] [address]             [phone]    HPI: [name] returns for scheduled postoperative follow-up visit.   She is a 76 year old former smoker who had a low-dose screening CT which showed a right upper lobe nodule. I did a thoracoscopic right upper lobectomy on 05/10/2016. 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 She saw Dr. Julien Nordmann last week. No adjuvant therapy was recommended.  Today her primary complaint is pain. She says that she has a sharp shooting pain in her right chest and into the breast. She feels like her breast is swollen. She is unable to wear a bra secondary to the discomfort.  Past Medical History:  Diagnosis Date  . Adenocarcinoma of right lung, stage 1 ([place]) 07/28/2016  . Anxiety   . Cancer ([place])    uterine  . COPD (chronic obstructive pulmonary disease) ([place])   . Cyst    left side of neck  . Depression   . GERD (gastroesophageal reflux disease)   . Heart murmur    per patient  . Hyperlipidemia   . Hypertension   . Low back pain   . Lumbar back pain   . Osteoarthritis   . Osteoporosis     Current Outpatient Prescriptions  Medication Sig Dispense Refill  . albuterol (PROVENTIL HFA;VENTOLIN HFA) 108 (90 BASE) MCG/ACT inhaler Inhale 2 puffs into the lungs every 6 (six) hours as needed. (Patient taking differently: Inhale 2 puffs into the lungs every 4 (four) hours as needed for wheezing or shortness of breath. ) 18 g 1  . albuterol (PROVENTIL) (2.5 MG/3ML) 0.083% nebulizer solution Take 3 mLs (2.5 mg total) by nebulization every 2 (two) hours as needed for wheezing or shortness of breath. 75 mL 12  . amLODipine (NORVASC) 2.5 MG tablet Take 5 mg by mouth at bedtime.     Marland Kitchen atorvastatin (LIPITOR) 20 MG tablet Take 20 mg by mouth daily.     . diazepam (VALIUM) 5 MG tablet Take 5 mg by mouth at bedtime as needed for anxiety (for nerves).     . Fluticasone-Salmeterol (ADVAIR DISKUS) 250-50  MCG/DOSE AEPB Inhale 1 puff into the lungs every 12 (twelve) hours. 60 each 0  . Glucosamine HCl (GLUCOSAMINE PO) Take 1 tablet by mouth daily.    Marland Kitchen lisinopril (PRINIVIL,ZESTRIL) 30 MG tablet Take 30 mg by mouth every morning.    . Magnesium 250 MG TABS Take 250 mg by mouth daily.    . metoCLOPramide (REGLAN) 5 MG tablet Take 5 mg by mouth 2 (two) times daily.     Marland Kitchen oxyCODONE (OXY IR/ROXICODONE) 5 MG immediate release tablet Take 1 tablet (5 mg total) by mouth every 6 (six) hours as needed for severe pain. 30 tablet 0  . pantoprazole (PROTONIX) 40 MG tablet Take 1 tablet (40 mg total) by mouth daily at 6 (six) AM. 30 tablet 0  . raloxifene (EVISTA) 60 MG tablet Take 60 mg by mouth daily.    . sertraline (ZOLOFT) 100 MG tablet Take 100 mg by mouth daily.    Marland Kitchen tiotropium (SPIRIVA) 18 MCG inhalation capsule Place 18 mcg into inhaler and inhale daily.      Marland Kitchen gabapentin (NEURONTIN) 300 MG capsule Take 1 capsule (300 mg total) by mouth 3 (three) times daily. 90 capsule 6   No current facility-administered medications for this visit.  Physical Exam BP (!) 145/74   Pulse 94   Resp 20   Ht '5\' 2"'$  (1.575 m)   Wt 151 lb (68.5 kg)   SpO2 98% Comment: RA  BMI 27.62 kg/m  [name] is a 76 year old woman in no acute distress Alert and oriented 3 with no focal deficits Cardiac exam regular rate and rhythm normal S1 and S2 no rubs murmurs or gallops Lungs diminished right base otherwise clear Incisions well healed Exquisitely tender to touch along the incision.  Diagnostic Tests: I reviewed her chest x-ray. Shows postoperative changes.  Impression: [name] is a 76 year old woman who is about 3 months out from a thoracoscopic right upper lobectomy. She has stage IA disease and does not need any adjuvant therapy. Her prognosis is good.  She is having a great deal of 5 pain from her incision. She's having more pain now than she was on her first postoperative visit. This is consistent  with neuropathic pain. I gave her an additional prescription for 30 oxycodone tablets 5 mg 1 tablet 4 times a day as needed for pain. I cautioned her that I don't think this is the primary treatment for her pain, although it might help her sleep. She is arty taking gabapentin but a very low dose. Then increase her dose to 300 mg by mouth 3 times a day and hopefully that will help with the neuralgia.  Plan: Return in 3 months with PA and lateral chest x-ray  Felicia Nakayama, MD [facility] [phone]

## 2016-08-03 ENCOUNTER — Other Ambulatory Visit: Payer: Self-pay | Admitting: *Deleted

## 2016-08-03 ENCOUNTER — Encounter: Payer: Self-pay | Admitting: *Deleted

## 2016-08-03 NOTE — Patient Outreach (Addendum)
[facility]) Care Management  [place] CM Routine Home Visit for discharge/ case closure 08/03/2016  TYE JUAREZ 1940-12-20 [id number]  DORIS GRUHN is a 76 y.o. female referred to [facility] for transition of care after recent hospitalization October 16-23, 2017 for (R) upper lobectomy. Patient has history including but not limited to COPD, HTN, HLD, CKD, and DVT. Patient was discharged home with home health [facility] services for PT and RN, which is now complete. HIPAA/ identity  verified in person with patient today.  Today, Anapaola reports that she continues "doing great and feeling good."     -- Provider appointments: Luevenia reports that she attended provider appointment with Dr. Earlie Server at [facility], which "went well."  Stanton Kidney also stated that she attended follow up appointment with Dr. Roxan Hockey yesterday and "got a good report."  States plan to attend PCP appointment "tomorrow."  -- Medications: Enedelia states that she has and is taking all of her medications as they are prescribed. Frances denied questions about her medications today and verbalizes a good general understnading of the purpose, dosing, and scheduling of her prescribed medications.  -- Self-health management of COPD: Yachet states that she is "doing fine, no problems with my breathing," and not having any concerning signs/ symptoms from her baseline. Soriya states that she is in the greenCOPD zone today, and is able to answer basic questions about COPD zones/ action plans. Soraiya reports that she is only SOB with "activity or excitement," and reports that her SOB "goes away quickly" with rest/ cessation of activity.  Lavene reports that previous "episodes of dizziness and "heart fluttering" have decreased in frequency, and spontaneously resolve without intervention, after few minutes of onset.  Patient was previously advised to report these symptoms to her providers, and states that she has done  so, both with her surgeon and her oncologist; states she will report during tomorrow's PCP appointment as well.  Ovella denies further concerns, issues, problems, or questions today, and we agreed that she is ready for discharge from [place] program.  I confirmed that Lillyan had my direct phone number, the main [facility] CM office number, and the 24-hour [facility] nurse advice line, should problems arise in the future.  Subjective: "I think I am doing real good!  I'm not having any real problems."  Objective:    BP (!) 142/80   Pulse 84   Resp 20   SpO2 97%    Review of Systems  Constitutional: Negative.   Respiratory: Positive for shortness of breath. Negative for cough.        Baseline SOB with activity; improves with rest within seconds  Cardiovascular: Negative for chest pain, palpitations and leg swelling.       Reports occasional palpitations/ self-limiting, states when this occurs, it usually goes away with coughing, or spontaneously  Gastrointestinal: Negative for abdominal pain and nausea.  Genitourinary: Negative.   Musculoskeletal: Negative for falls.  Skin: Negative.   Neurological: Positive for tingling.       Reports surgical site "tingles" occasionally  Psychiatric/Behavioral: Negative for depression. The patient is not nervous/anxious.     Physical Exam  Constitutional: She is oriented to person, place, and time. She appears well-developed and well-nourished. No distress.  Cardiovascular: Normal rate, regular rhythm, normal heart sounds and intact distal pulses.   Respiratory: Effort normal and breath sounds normal. No respiratory distress. She has no wheezes. She has no rales. She exhibits tenderness.  Post-op soreness reported "every  now and again"  GI: Soft. Bowel sounds are normal.  Musculoskeletal: She exhibits no edema.  Neurological: She is alert and oriented to person, place, and time.  Skin: Skin is warm and dry.  Psychiatric: She has a normal mood and affect.  Her behavior is normal. Judgment and thought content normal.    Encounter Medications:   Outpatient Encounter Prescriptions as of 08/03/2016  Medication Sig  . albuterol (PROVENTIL HFA;VENTOLIN HFA) 108 (90 BASE) MCG/ACT inhaler Inhale 2 puffs into the lungs every 6 (six) hours as needed. (Patient taking differently: Inhale 2 puffs into the lungs every 4 (four) hours as needed for wheezing or shortness of breath. )  . albuterol (PROVENTIL) (2.5 MG/3ML) 0.083% nebulizer solution Take 3 mLs (2.5 mg total) by nebulization every 2 (two) hours as needed for wheezing or shortness of breath.  Marland Kitchen amLODipine (NORVASC) 2.5 MG tablet Take 5 mg by mouth at bedtime.   Marland Kitchen atorvastatin (LIPITOR) 20 MG tablet Take 20 mg by mouth daily.   . diazepam (VALIUM) 5 MG tablet Take 5 mg by mouth at bedtime as needed for anxiety (for nerves).   . Fluticasone-Salmeterol (ADVAIR DISKUS) 250-50 MCG/DOSE AEPB Inhale 1 puff into the lungs every 12 (twelve) hours.  . gabapentin (NEURONTIN) 300 MG capsule Take 1 capsule (300 mg total) by mouth 3 (three) times daily.  . Glucosamine HCl (GLUCOSAMINE PO) Take 1 tablet by mouth daily.  Marland Kitchen lisinopril (PRINIVIL,ZESTRIL) 30 MG tablet Take 30 mg by mouth every morning.  . Magnesium 250 MG TABS Take 250 mg by mouth daily.  . metoCLOPramide (REGLAN) 5 MG tablet Take 5 mg by mouth 2 (two) times daily.   Marland Kitchen oxyCODONE (OXY IR/ROXICODONE) 5 MG immediate release tablet Take 1 tablet (5 mg total) by mouth every 6 (six) hours as needed for severe pain.  . pantoprazole (PROTONIX) 40 MG tablet Take 1 tablet (40 mg total) by mouth daily at 6 (six) AM.  . raloxifene (EVISTA) 60 MG tablet Take 60 mg by mouth daily.  . sertraline (ZOLOFT) 100 MG tablet Take 100 mg by mouth daily.  Marland Kitchen tiotropium (SPIRIVA) 18 MCG inhalation capsule Place 18 mcg into inhaler and inhale daily.     No facility-administered encounter medications on file as of 08/03/2016.     Functional Status:   In your present state of  health, do you have any difficulty performing the following activities: 05/31/2016 05/09/2016  Hearing? N N  Vision? N N  Difficulty concentrating or making decisions? N N  Walking or climbing stairs? N N  Dressing or bathing? N N  Doing errands, shopping? N N  Preparing Food and eating ? N -  Using the Toilet? N -  In the past six months, have you accidently leaked urine? N -  Do you have problems with loss of bowel control? N -  Managing your Medications? N -  Managing your Finances? N -  Housekeeping or managing your Housekeeping? N -  Some recent data might be hidden    Fall/Depression Screening:    PHQ 2/9 Scores 05/31/2016 03/18/2016 09/30/2015 08/17/2015 07/13/2015 05/05/2015 05/02/2015  PHQ - 2 Score 0 0 '1 1 1 6 ' 0  PHQ- 9 Score - - - - - 12 -    Assessment:  Javionna has recuperated well after her recent surgery, and remains committed to keeping all scheduled provider appointments and to compliance in general with her medical plan of care.  Shila has successfully met her previously established [place]  goals, agrees that she is ready for discharge from [place] program.    Plan:   Will close [place] program and make patient's PCP aware of same.   [name], RN, BSN, [facility] Care Management  [phone]

## 2016-08-08 DIAGNOSIS — E782 Mixed hyperlipidemia: Secondary | ICD-10-CM | POA: Diagnosis not present

## 2016-08-08 DIAGNOSIS — N183 Chronic kidney disease, stage 3 (moderate): Secondary | ICD-10-CM | POA: Diagnosis not present

## 2016-08-08 DIAGNOSIS — J449 Chronic obstructive pulmonary disease, unspecified: Secondary | ICD-10-CM | POA: Diagnosis not present

## 2016-08-08 DIAGNOSIS — F322 Major depressive disorder, single episode, severe without psychotic features: Secondary | ICD-10-CM | POA: Diagnosis not present

## 2016-08-08 DIAGNOSIS — K3184 Gastroparesis: Secondary | ICD-10-CM | POA: Diagnosis not present

## 2016-08-08 DIAGNOSIS — C3491 Malignant neoplasm of unspecified part of right bronchus or lung: Secondary | ICD-10-CM | POA: Diagnosis not present

## 2016-08-08 DIAGNOSIS — I10 Essential (primary) hypertension: Secondary | ICD-10-CM | POA: Diagnosis not present

## 2016-10-28 ENCOUNTER — Other Ambulatory Visit: Payer: Self-pay | Admitting: Thoracic Surgery (Cardiothoracic Vascular Surgery)

## 2016-10-28 DIAGNOSIS — C3490 Malignant neoplasm of unspecified part of unspecified bronchus or lung: Secondary | ICD-10-CM

## 2016-10-28 DIAGNOSIS — Z951 Presence of aortocoronary bypass graft: Secondary | ICD-10-CM

## 2016-10-31 DIAGNOSIS — M47896 Other spondylosis, lumbar region: Secondary | ICD-10-CM | POA: Diagnosis not present

## 2016-11-01 ENCOUNTER — Ambulatory Visit (INDEPENDENT_AMBULATORY_CARE_PROVIDER_SITE_OTHER): Payer: Medicare HMO | Admitting: Thoracic Surgery (Cardiothoracic Vascular Surgery)

## 2016-11-01 ENCOUNTER — Encounter: Payer: Self-pay | Admitting: Thoracic Surgery (Cardiothoracic Vascular Surgery)

## 2016-11-01 ENCOUNTER — Ambulatory Visit
Admission: RE | Admit: 2016-11-01 | Discharge: 2016-11-01 | Disposition: A | Discharge status: Home / Self Care | Payer: Medicare HMO | Source: Ambulatory Visit | Attending: Thoracic Surgery (Cardiothoracic Vascular Surgery) | Admitting: Thoracic Surgery (Cardiothoracic Vascular Surgery)

## 2016-11-01 VITALS — BP 135/80 | HR 100 | Resp 18 | Ht 62.0 in | Wt 151.0 lb

## 2016-11-01 DIAGNOSIS — D381 Neoplasm of uncertain behavior of trachea, bronchus and lung: Secondary | ICD-10-CM

## 2016-11-01 DIAGNOSIS — I82402 Acute embolism and thrombosis of unspecified deep veins of left lower extremity: Secondary | ICD-10-CM

## 2016-11-01 DIAGNOSIS — C801 Malignant (primary) neoplasm, unspecified: Secondary | ICD-10-CM

## 2016-11-01 DIAGNOSIS — Z902 Acquired absence of lung [part of]: Secondary | ICD-10-CM

## 2016-11-01 DIAGNOSIS — Z85118 Personal history of other malignant neoplasm of bronchus and lung: Secondary | ICD-10-CM | POA: Diagnosis not present

## 2016-11-01 DIAGNOSIS — C3490 Malignant neoplasm of unspecified part of unspecified bronchus or lung: Secondary | ICD-10-CM

## 2016-11-01 IMAGING — CR DG CHEST 2V
2 series · 2 of 2 positions shown · non-contrast
Comparison: Chest x-ray of [DATE]

CLINICAL DATA: History of lung carcinoma with surgery in [DATE], chest x-ray, followup

EXAM:
CHEST  2 VIEW

[w chest pa]
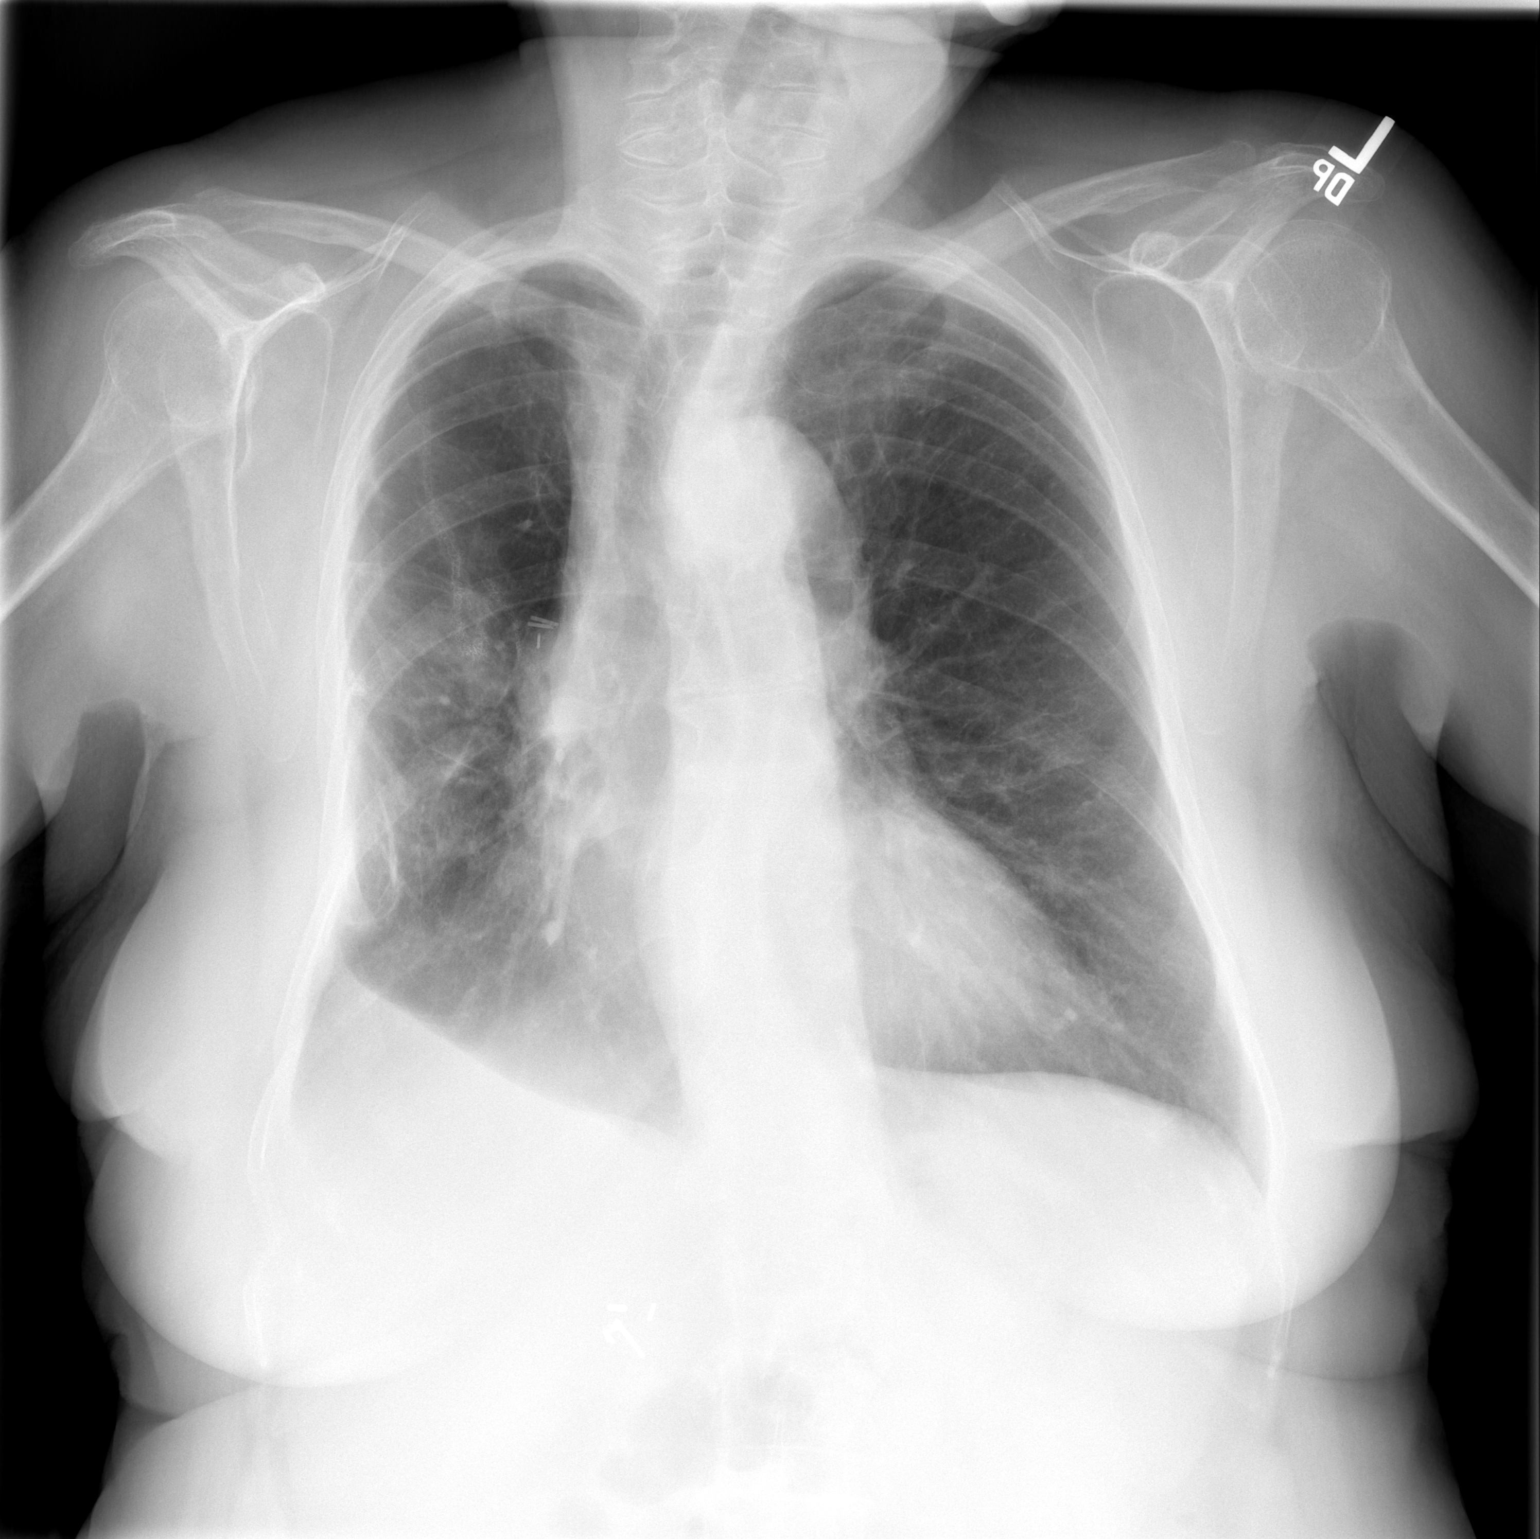

[w chest lat]
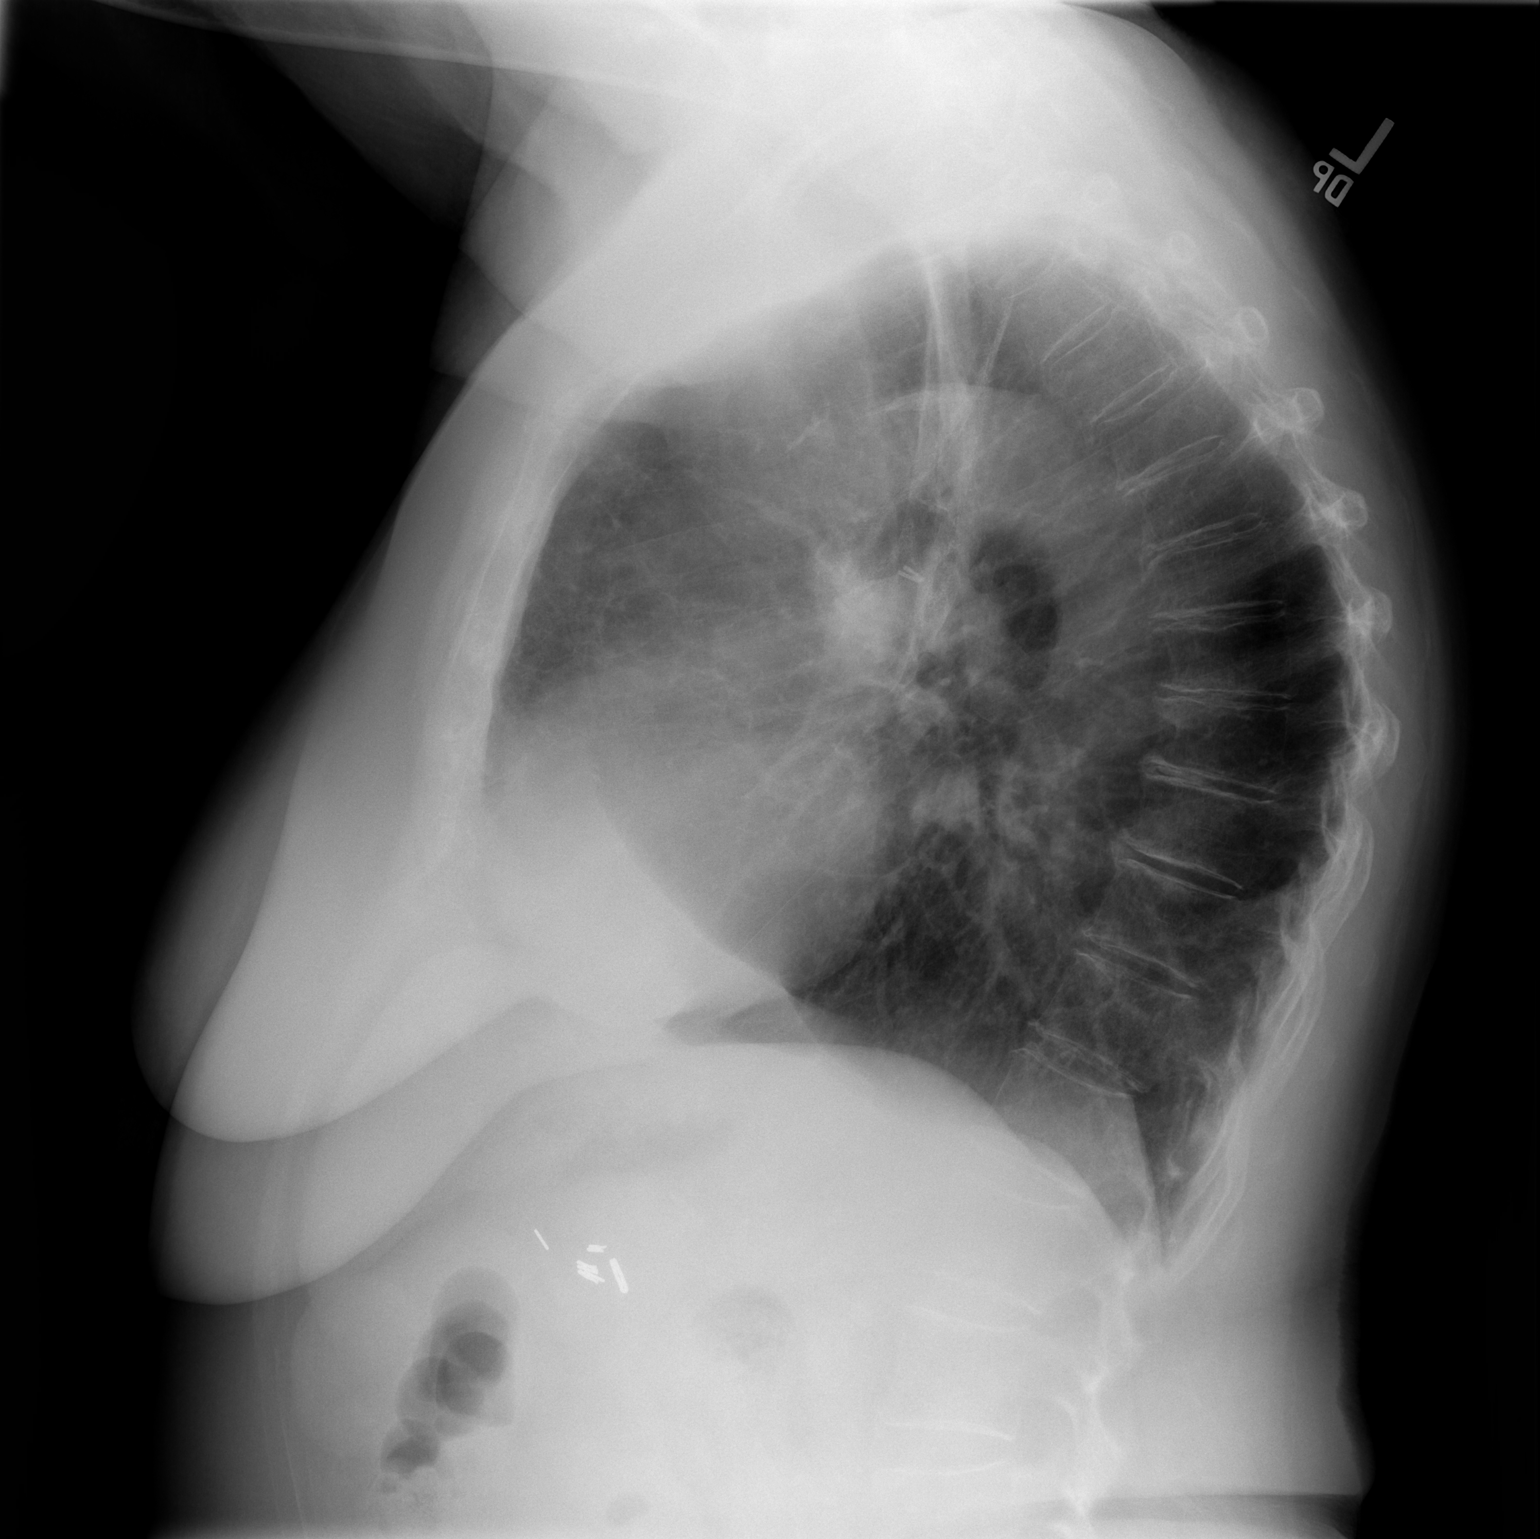

[2 of 2 positions shown; findings below may reference images not displayed]

FINDINGS: Postop changes in the right hemithorax are stable with volume loss
and scarring present. The left lung is clear. Mediastinal and hilar
contours are unchanged. The heart is within upper limits normal. No
acute bony abnormality is seen.
IMPRESSION: Stable postoperative change throughout the right hemithorax. No
definite active process.

## 2016-11-01 NOTE — Progress Notes (Signed)
[address]       [place] [address]             [phone]    HPI: 76 yo woman who returns for a scheduled follow up visit  Mrs. Schneck is a 76 yo woman who had right upper lobectomy for a stage IA adenocarcinoma of the lung that was noted on a low dose screening CT. She is a former smoker. I last saw her in Jan 2018. At that time she had NED but was having neuropathic pain. I increased her gabapentin to 300 mg TID.  She is still having a good deal of pain. It does not interfere with routine activities, but she still cannot wear bra and it hurts when she wears her seatbelt, etc. She has not had any difficulties with her breathing.  Past Medical History:  Diagnosis Date  . Adenocarcinoma of right lung, stage 1 ([place]) 07/28/2016  . Anxiety   . Cancer ([place])    uterine  . COPD (chronic obstructive pulmonary disease) ([place])   . Cyst    left side of neck  . Depression   . GERD (gastroesophageal reflux disease)   . Heart murmur    per patient  . Hyperlipidemia   . Hypertension   . Low back pain   . Lumbar back pain   . Osteoarthritis   . Osteoporosis     Current Outpatient Prescriptions  Medication Sig Dispense Refill  . albuterol (PROVENTIL HFA;VENTOLIN HFA) 108 (90 BASE) MCG/ACT inhaler Inhale 2 puffs into the lungs every 6 (six) hours as needed. (Patient taking differently: Inhale 2 puffs into the lungs every 4 (four) hours as needed for wheezing or shortness of breath. ) 18 g 1  . albuterol (PROVENTIL) (2.5 MG/3ML) 0.083% nebulizer solution Take 3 mLs (2.5 mg total) by nebulization every 2 (two) hours as needed for wheezing or shortness of breath. 75 mL 12  . amLODipine (NORVASC) 2.5 MG tablet Take 5 mg by mouth at bedtime.     Marland Kitchen atorvastatin (LIPITOR) 20 MG tablet Take 20 mg by mouth daily.     . diazepam (VALIUM) 5 MG tablet Take 5 mg by mouth at bedtime as needed for anxiety (for nerves).     . Fluticasone-Salmeterol (ADVAIR DISKUS) 250-50 MCG/DOSE  AEPB Inhale 1 puff into the lungs every 12 (twelve) hours. 60 each 0  . gabapentin (NEURONTIN) 300 MG capsule Take 1 capsule (300 mg total) by mouth 3 (three) times daily. 90 capsule 6  . Glucosamine HCl (GLUCOSAMINE PO) Take 1 tablet by mouth daily.    Marland Kitchen lisinopril (PRINIVIL,ZESTRIL) 30 MG tablet Take 30 mg by mouth every morning.    . Magnesium 250 MG TABS Take 250 mg by mouth daily.    . metoCLOPramide (REGLAN) 5 MG tablet Take 5 mg by mouth 2 (two) times daily.     Marland Kitchen oxyCODONE (OXY IR/ROXICODONE) 5 MG immediate release tablet Take 1 tablet (5 mg total) by mouth every 6 (six) hours as needed for severe pain. 30 tablet 0  . pantoprazole (PROTONIX) 40 MG tablet Take 1 tablet (40 mg total) by mouth daily at 6 (six) AM. 30 tablet 0  . raloxifene (EVISTA) 60 MG tablet Take 60 mg by mouth daily.    . sertraline (ZOLOFT) 100 MG tablet Take 100 mg by mouth daily.    Marland Kitchen tiotropium (SPIRIVA) 18 MCG inhalation capsule Place 18 mcg into inhaler and inhale daily.  No current facility-administered medications for this visit.     Physical Exam  Diagnostic Tests: CHEST  2 VIEW  COMPARISON:  Chest x-ray of 08/02/2016  FINDINGS: Postop changes in the right hemithorax are stable with volume loss and scarring present. The left lung is clear. Mediastinal and hilar contours are unchanged. The heart is within upper limits normal. No acute bony abnormality is seen.  IMPRESSION: Stable postoperative change throughout the right hemithorax. No definite active process.   Electronically Signed   By: Ivar Drape M.D.   On: 11/01/2016 09:14 I personally reviewed the chest Xray and concur with the findings noted above  Impression: Mrs. Gilles is a 76 year old woman who had a right upper lobectomy via a VATS approach 6 months ago. She has no evidence of recurrent disease.  She's not sure when she sees Dr. Julien Nordmann, but it should be relatively soon as she is due for a 6 month CT of the  chest.  Pain- continues to be an issue. Oxycodone was ineffective. She is no longer using that. She is using gabapentin 300 mg 3 times a day. She says that she hasn't noticed a difference in the pain although she does move much better today than she did at her last visit and also seems in better spirits overall. This likely will resolve over time, but in the meantime, I think it would be reasonable to refer her to a pain clinic to see if they can do some injections or something that might speed the process.  Plan: Follow-up as planned with Dr. Julien Nordmann  I will see her back in 6 months to check on her progress.  I'll be happy to see her back before that if needed.  [name], MD [facility] [phone]

## 2016-11-02 ENCOUNTER — Encounter: Payer: Self-pay | Admitting: *Deleted

## 2016-11-02 DIAGNOSIS — M792 Neuralgia and neuritis, unspecified: Secondary | ICD-10-CM | POA: Insufficient documentation

## 2016-11-03 DIAGNOSIS — M1711 Unilateral primary osteoarthritis, right knee: Secondary | ICD-10-CM | POA: Diagnosis not present

## 2016-11-03 DIAGNOSIS — M1712 Unilateral primary osteoarthritis, left knee: Secondary | ICD-10-CM | POA: Diagnosis not present

## 2016-11-09 ENCOUNTER — Other Ambulatory Visit: Payer: Self-pay | Admitting: *Deleted

## 2016-11-21 DIAGNOSIS — M810 Age-related osteoporosis without current pathological fracture: Secondary | ICD-10-CM | POA: Diagnosis not present

## 2016-11-21 DIAGNOSIS — F419 Anxiety disorder, unspecified: Secondary | ICD-10-CM | POA: Diagnosis not present

## 2016-11-21 DIAGNOSIS — Z1389 Encounter for screening for other disorder: Secondary | ICD-10-CM | POA: Diagnosis not present

## 2016-11-21 DIAGNOSIS — J449 Chronic obstructive pulmonary disease, unspecified: Secondary | ICD-10-CM | POA: Diagnosis not present

## 2016-11-21 DIAGNOSIS — N183 Chronic kidney disease, stage 3 (moderate): Secondary | ICD-10-CM | POA: Diagnosis not present

## 2016-11-21 DIAGNOSIS — E782 Mixed hyperlipidemia: Secondary | ICD-10-CM | POA: Diagnosis not present

## 2016-11-21 DIAGNOSIS — K219 Gastro-esophageal reflux disease without esophagitis: Secondary | ICD-10-CM | POA: Diagnosis not present

## 2016-11-21 DIAGNOSIS — I10 Essential (primary) hypertension: Secondary | ICD-10-CM | POA: Diagnosis not present

## 2016-11-21 DIAGNOSIS — M1990 Unspecified osteoarthritis, unspecified site: Secondary | ICD-10-CM | POA: Diagnosis not present

## 2016-11-21 DIAGNOSIS — Z0000 Encounter for general adult medical examination without abnormal findings: Secondary | ICD-10-CM | POA: Diagnosis not present

## 2016-11-21 DIAGNOSIS — F322 Major depressive disorder, single episode, severe without psychotic features: Secondary | ICD-10-CM | POA: Diagnosis not present

## 2016-11-21 DIAGNOSIS — R7309 Other abnormal glucose: Secondary | ICD-10-CM | POA: Diagnosis not present

## 2016-11-21 DIAGNOSIS — K3184 Gastroparesis: Secondary | ICD-10-CM | POA: Diagnosis not present

## 2016-11-25 DIAGNOSIS — D501 Sideropenic dysphagia: Secondary | ICD-10-CM | POA: Diagnosis not present

## 2016-12-02 ENCOUNTER — Encounter: Payer: Self-pay | Admitting: Physical Medicine & Rehabilitation

## 2016-12-09 ENCOUNTER — Encounter: Payer: Medicare HMO | Attending: Physical Medicine & Rehabilitation

## 2016-12-09 ENCOUNTER — Encounter: Payer: Self-pay | Admitting: Physical Medicine & Rehabilitation

## 2016-12-09 ENCOUNTER — Ambulatory Visit (HOSPITAL_BASED_OUTPATIENT_CLINIC_OR_DEPARTMENT_OTHER): Payer: Medicare HMO | Admitting: Physical Medicine & Rehabilitation

## 2016-12-09 VITALS — BP 150/81 | HR 77 | Resp 14

## 2016-12-09 DIAGNOSIS — M961 Postlaminectomy syndrome, not elsewhere classified: Secondary | ICD-10-CM | POA: Diagnosis not present

## 2016-12-09 DIAGNOSIS — F419 Anxiety disorder, unspecified: Secondary | ICD-10-CM | POA: Diagnosis not present

## 2016-12-09 DIAGNOSIS — G588 Other specified mononeuropathies: Secondary | ICD-10-CM | POA: Insufficient documentation

## 2016-12-09 DIAGNOSIS — F329 Major depressive disorder, single episode, unspecified: Secondary | ICD-10-CM | POA: Diagnosis not present

## 2016-12-09 DIAGNOSIS — I10 Essential (primary) hypertension: Secondary | ICD-10-CM | POA: Diagnosis not present

## 2016-12-09 DIAGNOSIS — Z87891 Personal history of nicotine dependence: Secondary | ICD-10-CM | POA: Insufficient documentation

## 2016-12-09 DIAGNOSIS — E785 Hyperlipidemia, unspecified: Secondary | ICD-10-CM | POA: Insufficient documentation

## 2016-12-09 DIAGNOSIS — K219 Gastro-esophageal reflux disease without esophagitis: Secondary | ICD-10-CM | POA: Diagnosis not present

## 2016-12-09 DIAGNOSIS — J449 Chronic obstructive pulmonary disease, unspecified: Secondary | ICD-10-CM | POA: Insufficient documentation

## 2016-12-09 MED ORDER — GABAPENTIN 400 MG PO CAPS: 400.0000 mg | ORAL_CAPSULE | Freq: Three times a day (TID) | ORAL | 1 refills | Status: DC

## 2016-12-09 NOTE — Patient Instructions (Signed)
Intercostal Nerve Block Patient Information  Description: The twelve intercostal nerves arise from the first thru twelfth thoracic nerve roots.  The nerve begins at the spine and wraps around the body, lying in a groove underneath each rib.  Each intercostal nerve innervates a specific strip of skin and body walk of the abdomen and chest.  Therefore, injuries of the chest wall or abdominal wall result in pain that is transmitted back to the brian via the intercostal nerves.  Examples of such injuries include rib fractures and incisions for lung and gall bladder surgery.  Occasionally, pain may persist long after an injury or surgical incision secondary to inflammation and irritation of the intercostal nerve.  The longstanding pain is known as intercostal neuralgia.  An intercostal nerve block is preformed to eliminate pain either temporarily or permanently.  A small needle is placed below the rib and local anesthetic (like Novocaine) and possibly steroid is injected.  Usually 2-4 intercostal nerves are blocked at a time depending on the problem.  The patient will experience a slight "pin-prick" sensation for each injection.  Shortly thereafter, the strip of skin that is innervated by the blocked intercostal nerve will feel numb.  Persistent pain that is only temporarily relieved with local anesthetic may require a more permanent block. This procedure is called Cryoneurolysis and entails placing a small probe beneath the rib to freeze the nerve.  Conditions that may be treated by intercostal nerve blocks:   Rib fractures  Longstanding pain from surgery of the chest or abdomen (intercostal neuralgia)  Pain from chest tubes  Pain from trauma to the chest  Preparation for the injections:  1. Do not eat any solid food or dairy products within 8 hours of your appointment. 2. You may drink clear liquids up to 3 hours before appointment.  Clear liquids include water, black coffee, juice or soda.  No milk  or cream please. 3. You may take your regular medication, including pain medications, with a sip of water before your appointment.  Diabetics should hold regular insulin (if take separately) and take 1/2 normal NPH dose the morning of the procedure.   Carry some sugar containing items with you to your appointment. 4. A driver must accompany you and be prepared to drive you home after your procedure. 5. Bring all your current medications with you. 6. An IV may be inserted and sedation may be given at the discretion of the physician. 7. A blood pressure cuff, EKG and other monitors will often be applied during the procedure.  Some patients may need to have extra oxygen administered for a short period. 8. You will be asked to provide medical information, including your allergies, prior to the procedure.  We must know immediately if you are taking blood thinners (like Coumadin/Warfarin) or if you are allergic to IV iodine contrast (dye). We must know if you could possible be pregnant.  Possible side-effects:   Bleeding from needle site  Infection (rare)  Nerve injury (rare)  Numbness & tingling of skin  Collapsed lung requiring chest tube (rare)  Local anesthetic toxicity (rare)  Light-headedness (temporary)  Pain at injection site (several days)  Decreased blood pressure (temporary)  Shortness of breath  Jittery/shaking sensation (temporary)  Call if you experience:   Difficulty breathing or hives (go directly to the emergency room)  Redness, inflammation or drainage at the injection site  Severe pain at the site of the injection  Any new symptoms which are concerning   Please try  Lidocaine patch to the Right side of your ribs  Your pain may subside immediately but may return several hours after the injection.  Often, more than one injection is required to reduce the pain. Also, if several temporary blocks with local anesthetic are ineffective, a more permanent block with  cryolysis may be necessary.  This will be discussed with you should this be the case.  If you have any questions, please call [phone] [facility]

## 2016-12-09 NOTE — Progress Notes (Signed)
Subjective:    Patient ID: [name], female    DOB: 1941-04-08, 76 y.o.   MRN: [id number] Consult requested by Dr. Roxan Hockey HPI 76 year old female with right upper lobectomy for stage IA. [place] CA of the lung October 2017.  Patient has developed right-sided rib pain. She has been referred by her cardiothoracic surgeon for further evaluation.   Last cardiothoracic note indicated that the patient was unable to wear a bra. Patient is able to wear a bra now, although she feels like her pain has not changed that much. Her gabapentin is partially helpful at a dose of 300 mg 3 times per day and she is not having any side effects from this medication. She has been previously trialed on narcotic analgesics but was not able to tolerate She has no difficulty with breathing.  Pain Inventory Average Pain 7 Pain Right Now 7 My pain is sharp and aching  In the last 24 hours, has pain interfered with the following? General activity 0 Relation with others 0 Enjoyment of life 0 What TIME of day is your pain at its worst? morning Sleep (in general) Fair  Pain is worse with: walking, bending, standing and some activites Pain improves with: heat/ice and medication Relief from Meds: 9  Mobility walk with assistance use a cane ability to climb steps?  yes do you drive?  yes  Function not employed: date last employed .  Neuro/Psych trouble walking  Prior Studies new visit  Physicians involved in your care new visit   Family History  Problem Relation Age of Onset  . Heart disease Mother   . Heart disease Father   . Cancer Sister        #1, breast  . Heart disease Brother        had CABG   Social History   Social History  . Marital status: Widowed    Spouse name: N/A  . Number of children: N/A  . Years of education: N/A   Social History Main Topics  . Smoking status: Former Smoker    Packs/day: 2.00    Years: 51.00    Types: Cigarettes    Quit date: 05/04/2012    . Smokeless tobacco: Never Used  . Alcohol use No  . Drug use: No  . Sexual activity: Yes    Birth control/ protection: Other-see comments   Other Topics Concern  . None   Social History Narrative   Single, lives alone with 2 dogs   3 children   Retired: several careers included Regulatory affairs officer, diners, truck stops   Works now on the weekends at [employer]   Past Surgical History:  Procedure Laterality Date  . ANKLE SURGERY Right    horse accident  . APPENDECTOMY    . BACK SURGERY    . CHOLECYSTECTOMY    . EYE SURGERY     lense implant  . FRACTURE SURGERY    . HIP SURGERY  01/2010   Left Hip   . INNER EAR SURGERY Bilateral 1970   related to severe ear infections  . KNEE SURGERY     Right knee  . LOBECTOMY Right 05/09/2016   Procedure: RIGHT UPPER LOBECTOMY;  Surgeon: [name], MD;  Location: [facility];  Service: Thoracic;  Laterality: Right;  . MASS EXCISION  08/25/2011   Procedure: EXCISION MASS;  Surgeon: Harl Bowie, MD;  Location: [facility];  Service: General;  Laterality: N/A;  excision left neck mass  . TOTAL  ABDOMINAL HYSTERECTOMY    . VIDEO ASSISTED THORACOSCOPY (VATS)/WEDGE RESECTION Right 05/09/2016   Procedure: VIDEO ASSISTED THORACOSCOPY (VATS)/WEDGE RESECTION;  Surgeon: [name], MD;  Location: [place];  Service: Thoracic;  Laterality: Right;   Past Medical History:  Diagnosis Date  . Adenocarcinoma of right lung, stage 1 ([place]) 07/28/2016  . Anxiety   . Cancer ([place])    uterine  . COPD (chronic obstructive pulmonary disease) ([place])   . Cyst    left side of neck  . Depression   . GERD (gastroesophageal reflux disease)   . Heart murmur    per patient  . Hyperlipidemia   . Hypertension   . Low back pain   . Lumbar back pain   . Osteoarthritis   . Osteoporosis    BP (!) 150/81 (BP Location: Right Arm, Patient Position: Sitting, Cuff Size: Normal)   Pulse 77   Resp 14   SpO2 94%   Opioid Risk Score:   Fall Risk  Score:  `1  Depression screen PHQ 2/9  Depression screen [facility] 2/9 12/09/2016 06/28/2016 05/31/2016 03/18/2016 09/30/2015 08/17/2015 07/13/2015  Decreased Interest 1 0 0 0 0 0 0  Down, Depressed, Hopeless 1 0 0 0 '1 1 1  '$ PHQ - 2 Score 2 0 0 0 '1 1 1  '$ Altered sleeping 2 - - - - - -  Tired, decreased energy 2 - - - - - -  Change in appetite 3 - - - - - -  Feeling bad or failure about yourself  2 - - - - - -  Trouble concentrating 2 - - - - - -  Moving slowly or fidgety/restless 2 - - - - - -  Suicidal thoughts 0 - - - - - -  PHQ-9 Score 15 - - - - - -  Difficult doing work/chores - - - - - - -    Review of Systems  Constitutional: Positive for diaphoresis and unexpected weight change.  HENT: Negative.   Eyes: Negative.   Respiratory: Positive for cough and wheezing.   Cardiovascular: Negative.   Gastrointestinal: Positive for abdominal pain and vomiting.  Endocrine: Negative.   Genitourinary: Negative.   Musculoskeletal: Positive for arthralgias and back pain.  Skin: Negative.   Allergic/Immunologic: Negative.   Neurological: Negative.   Hematological: Negative.   Psychiatric/Behavioral: Negative.        Objective:   Physical Exam  Constitutional: She is oriented to person, place, and time. She appears well-developed and well-nourished.  HENT:  Head: Normocephalic and atraumatic.  Eyes: Conjunctivae and EOM are normal. Pupils are equal, round, and reactive to light.  Neck: Normal range of motion.  Cardiovascular: Normal rate, regular rhythm and normal heart sounds.  Exam reveals no friction rub.   No murmur heard. Pulmonary/Chest: Effort normal and breath sounds normal. No respiratory distress. She has no wheezes. Chest wall is not dull to percussion. She exhibits tenderness and bony tenderness. She exhibits no mass and no edema.    Patient complains of some right-sided rib pain when taking a deep breath  There is tenderness to palpation at the mid axillary line over the limited  thoracotomy scar as well as over the chest tube scars  Patient jumps with light palpation  Abdominal: Soft. Bowel sounds are normal.  Neurological: She is alert and oriented to person, place, and time.  Psychiatric: Her mood appears anxious. Her affect is inappropriate. Her speech is rapid and/or pressured. She is hyperactive.  Nursing note and  vitals reviewed.  Patient also complains of pain in bilateral knees and she jumps with very light palpation       Assessment & Plan:  1. Intercostal neuralgia, thoracic postlaminectomy syndrome. Her pain is mainly at the incision sites, although she does have some milder pain at the midaxillary line between the incision sites. We discussed that she would be a good candidate for intercostal nerve blocks, which can be performed under ultrasound guidance. She, however, does not want to pursue this option.  We discussed medical options, including increasing dose of gabapentin to 400 mg 3 times a day. She will follow-up with me in 6 weeks and if her pain is still not adequately controlled. She may increase again to 600 mg 3 times a day.  We also described over-the-counter treatment options, including lidocaine patches

## 2016-12-13 ENCOUNTER — Telehealth: Payer: Self-pay | Admitting: *Deleted

## 2016-12-13 NOTE — Telephone Encounter (Signed)
May increase gabapentin to 600 mg 3 times per day

## 2016-12-13 NOTE — Telephone Encounter (Signed)
Patient left a message stating that she saw Dr. Letta Pate on the 18th.  She says 'she is hurting so bad that she can hardly stand it.  She says 'she is gonna have to go to the hospital or somthing, cause her ribs under there  is all blue and she does not understand that'.  She says the patch that she was prescribed is doing nothing. Ephraim Hamburger is doing nothing.  She is asking for the doctor to help her out because she needs something to help her pain....Marland KitchenMarland KitchenPlease Advise

## 2016-12-14 ENCOUNTER — Other Ambulatory Visit: Payer: Self-pay | Admitting: Internal Medicine

## 2016-12-14 DIAGNOSIS — G588 Other specified mononeuropathies: Secondary | ICD-10-CM | POA: Diagnosis not present

## 2016-12-14 DIAGNOSIS — R079 Chest pain, unspecified: Secondary | ICD-10-CM

## 2016-12-14 DIAGNOSIS — C3491 Malignant neoplasm of unspecified part of right bronchus or lung: Secondary | ICD-10-CM | POA: Diagnosis not present

## 2016-12-14 DIAGNOSIS — G8912 Acute post-thoracotomy pain: Secondary | ICD-10-CM | POA: Diagnosis not present

## 2016-12-14 MED ORDER — GABAPENTIN 300 MG PO CAPS: 600.0000 mg | ORAL_CAPSULE | Freq: Three times a day (TID) | ORAL | 1 refills | Status: DC

## 2016-12-14 NOTE — Telephone Encounter (Signed)
I changed dosage to 300 mg (2 tid) and sent new order to pharmacy.  I called and spoke with [name] to inform her that the increase had been made.  She says if she felt like this when she was in the office she would have said "go ahead and give me the shot."

## 2016-12-20 ENCOUNTER — Ambulatory Visit
Admission: RE | Admit: 2016-12-20 | Discharge: 2016-12-20 | Disposition: A | Discharge status: Home / Self Care | Payer: Medicare HMO | Source: Ambulatory Visit | Attending: Internal Medicine | Admitting: Internal Medicine

## 2016-12-20 DIAGNOSIS — I2584 Coronary atherosclerosis due to calcified coronary lesion: Secondary | ICD-10-CM | POA: Diagnosis not present

## 2016-12-20 DIAGNOSIS — I700 Atherosclerosis of aorta: Secondary | ICD-10-CM | POA: Diagnosis not present

## 2016-12-20 DIAGNOSIS — R079 Chest pain, unspecified: Secondary | ICD-10-CM

## 2016-12-20 IMAGING — CT CT CHEST W/ CM
2 of 3 series · 16 of 30 positions shown, 19 images · IV contrast (75CC ISOVUE 300)
Comparison: Chest radiograph [DATE] ; PET-CT [DATE]

CLINICAL DATA: Previous right-sided low back for carcinoma

EXAM:
CT CHEST WITH CONTRAST
TECHNIQUE: Multidetector CT imaging of the chest was performed during
intravenous contrast administration.
CONTRAST:  75mL [EP] IOPAMIDOL ([EP]) INJECTION 61%

[Series 3: chest with · axial · 0.81mm/px · z∈[-314,-70]mm · 10 of 120 slices shown, 13 images]
[im 11/120  mediastinal]
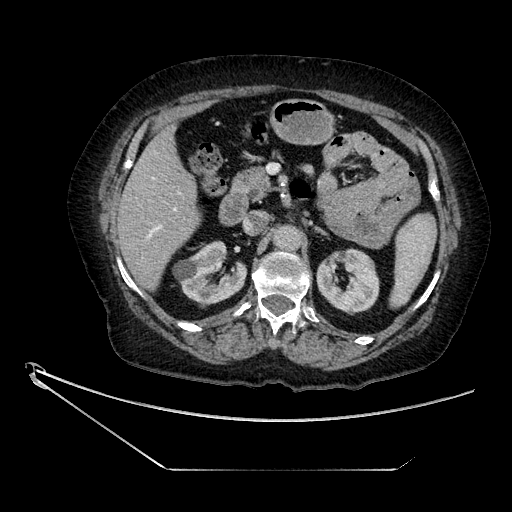
[im 11/120  lung]
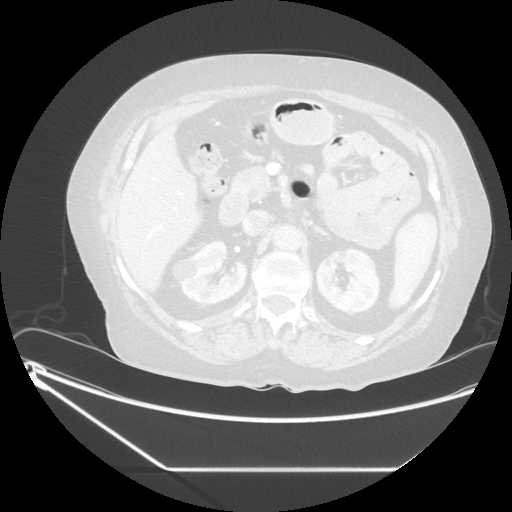
[im 22/120  lung]
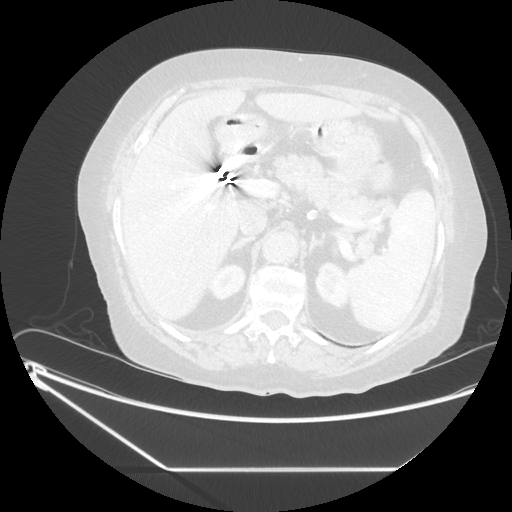
[im 33/120  lung]
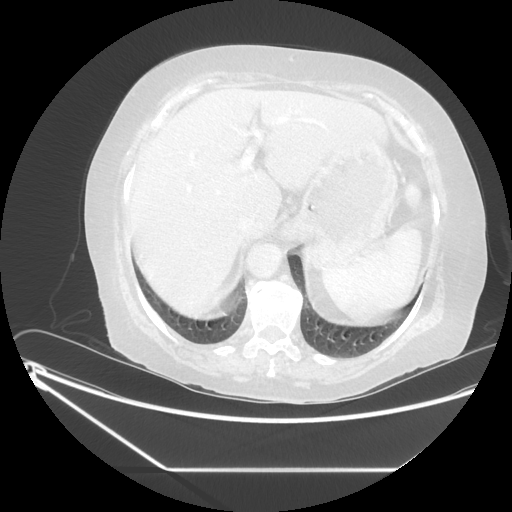
[im 44/120  lung]
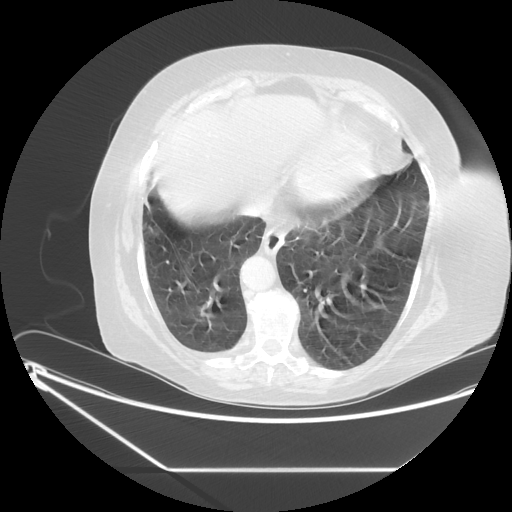
[im 55/120  mediastinal]
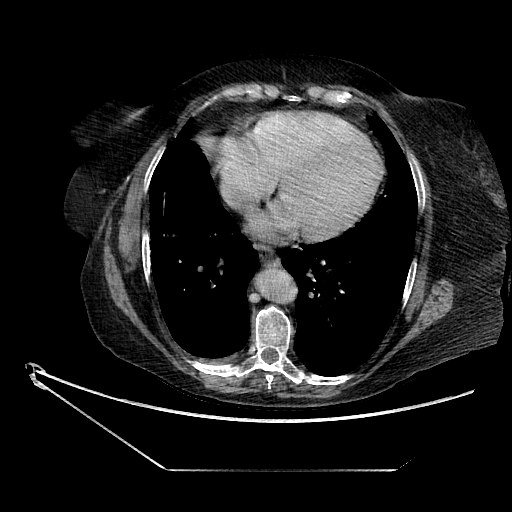
[im 55/120  lung]
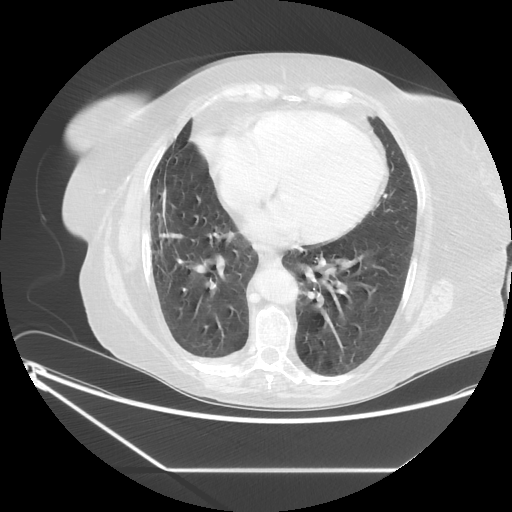
[im 65/120  lung]
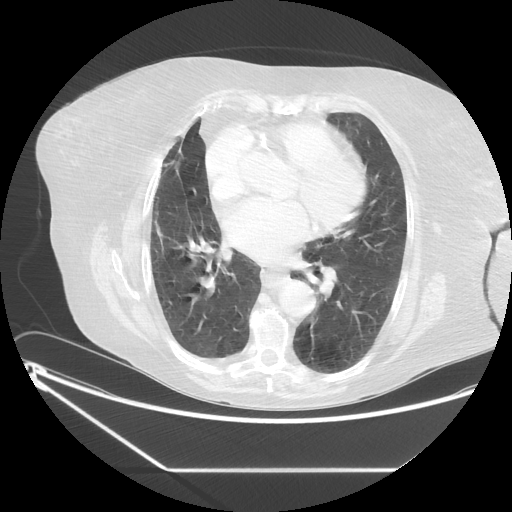
[im 76/120  lung]
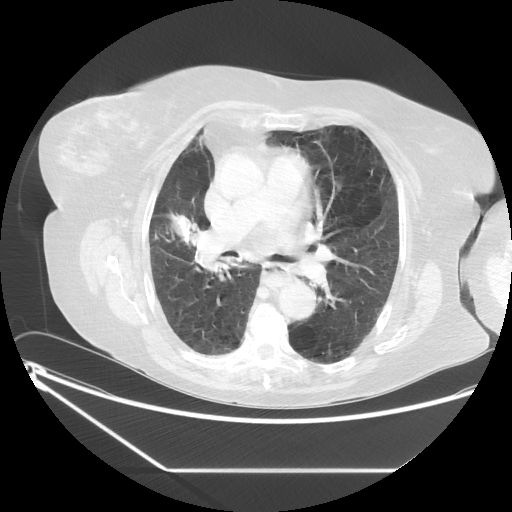
[im 87/120  lung]
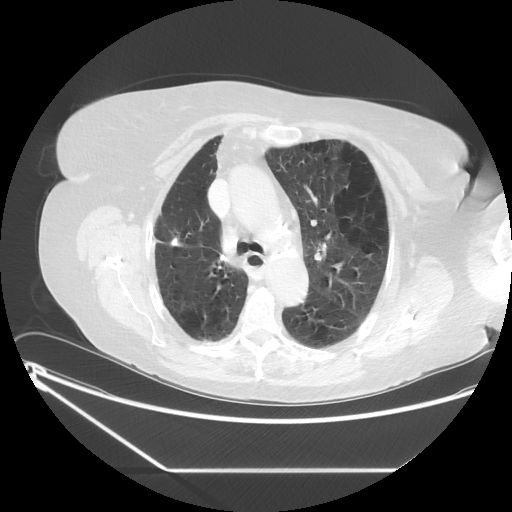
[im 98/120  mediastinal]
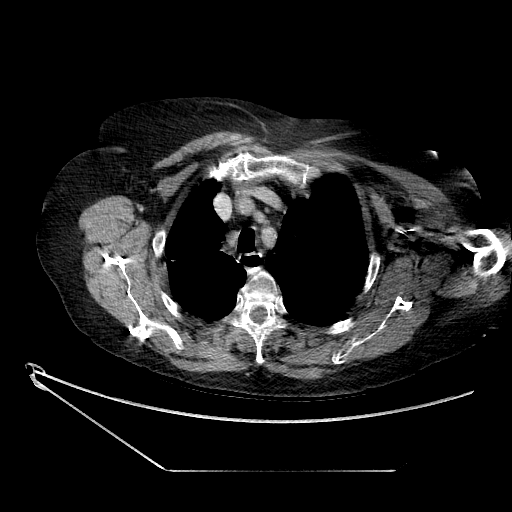
[im 98/120  lung]
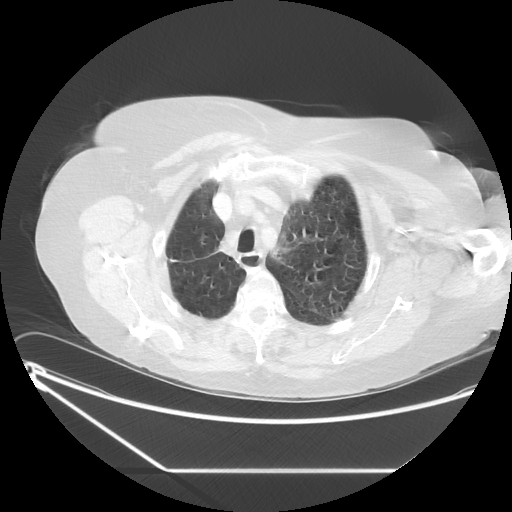
[im 109/120  lung]
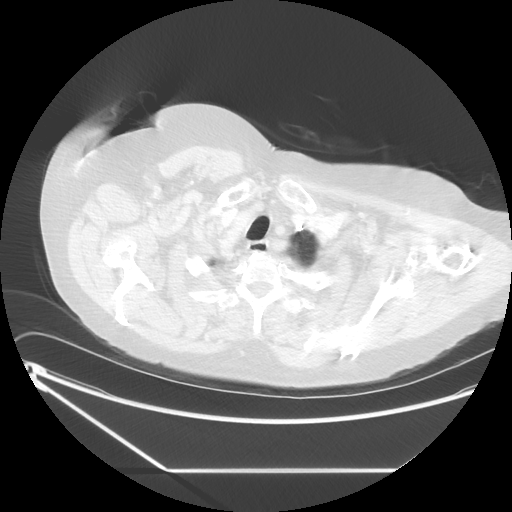

[Series 602: sagittal body · sagittal · 0.81mm/px · 6 of 155 slices shown]
[im 11/155  mediastinal]
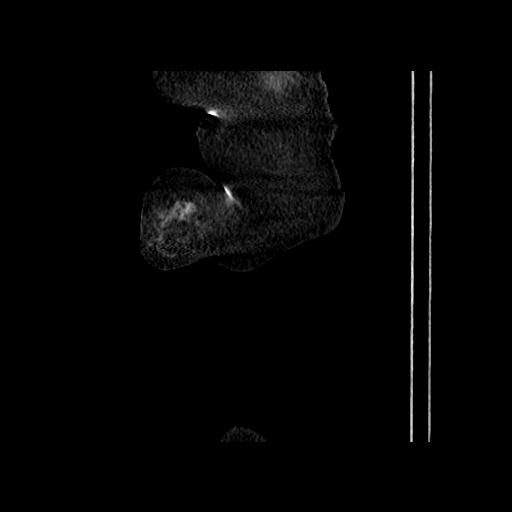
[im 31/155  mediastinal]
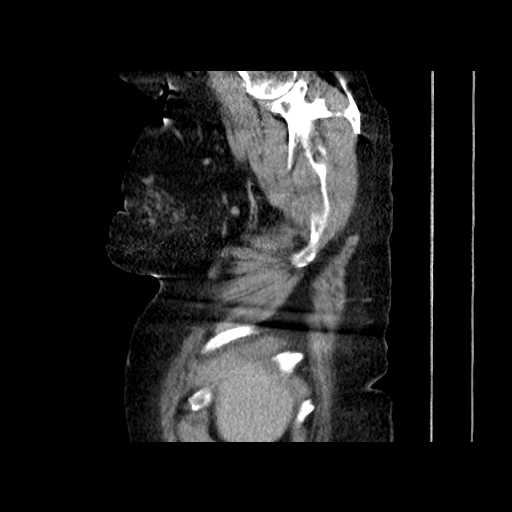
[im 52/155  mediastinal]
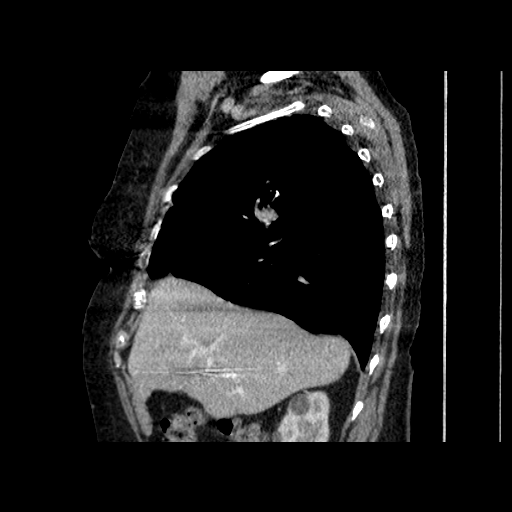
[im 72/155  mediastinal]
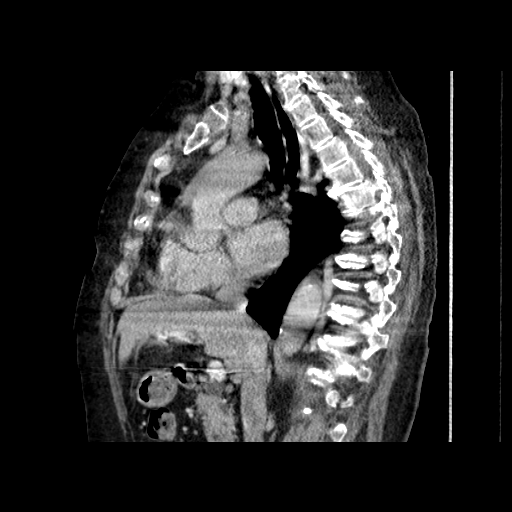
[im 83/155  mediastinal]
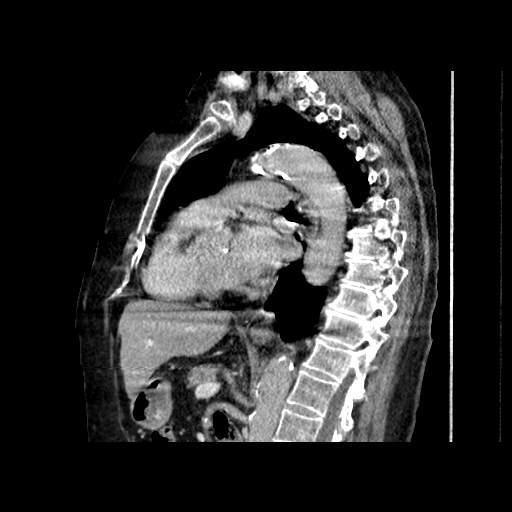
[im 103/155  mediastinal]
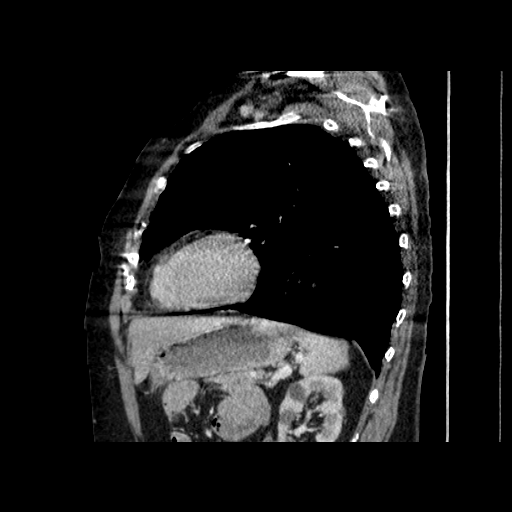

[16 of 30 positions shown; findings below may reference images not displayed]

FINDINGS: Cardiovascular: There is no evident thoracic aortic aneurysm or
dissection. There is calcification in the proximal great vessels.
There are foci of atherosclerotic calcification in the aorta as well
as foci of coronary artery calcification. Pericardium is not
appreciably thickened. No central pulmonary embolus evident.

Mediastinum/Nodes: Visualized thyroid appears unremarkable. There is
no demonstrable thoracic adenopathy. There is a small hiatal hernia.

Lungs/Pleura: There is underlying centrilobular emphysematous
change. Bullous disease is noted in the left upper lobe, stable.

There is volume loss on the right with mild right lateral and right
posterior pleural thickening. There is soft tissue prominence in the
right perihilar region with surgical clips in this area, likely due
to scarring. No nodular appearing lesion evident on either side.
There is no edema or consolidation appreciable. No pleural effusion
appreciable on either side.

Upper Abdomen: There are cysts arising from each kidney, largest
seen on the right measuring 2.3 x 2.3 cm. This finding was present
on prior PET study. Gallbladder is absent. There are foci of
atherosclerotic calcification in the aorta. Visualized upper
abdominal structures otherwise appear normal.

Musculoskeletal: There are no blastic or lytic bone lesions.
IMPRESSION: Underlying emphysematous change. Postoperative change on the right
with scarring in the right perihilar region. No mass or adenopathy
demonstrable. No edema or consolidation. Areas of pleural thickening
the right felt to be of postoperative/post inflammatory etiology.
Foci of atherosclerotic calcification noted as well as foci of
coronary artery calcification.

## 2016-12-20 MED ORDER — IOPAMIDOL (ISOVUE-300) INJECTION 61%
75.0000 mL | Freq: Once | INTRAVENOUS | Status: AC | PRN
  Administered 2016-12-20: 75 mL via INTRAVENOUS

## 2016-12-26 DIAGNOSIS — D501 Sideropenic dysphagia: Secondary | ICD-10-CM | POA: Diagnosis not present

## 2016-12-27 DIAGNOSIS — M810 Age-related osteoporosis without current pathological fracture: Secondary | ICD-10-CM | POA: Diagnosis not present

## 2017-01-16 ENCOUNTER — Encounter: Payer: Medicare HMO | Attending: Physical Medicine & Rehabilitation

## 2017-01-16 ENCOUNTER — Ambulatory Visit: Payer: Medicare HMO | Admitting: Physical Medicine & Rehabilitation

## 2017-01-16 DIAGNOSIS — G588 Other specified mononeuropathies: Secondary | ICD-10-CM | POA: Insufficient documentation

## 2017-01-16 DIAGNOSIS — E785 Hyperlipidemia, unspecified: Secondary | ICD-10-CM | POA: Insufficient documentation

## 2017-01-16 DIAGNOSIS — M961 Postlaminectomy syndrome, not elsewhere classified: Secondary | ICD-10-CM | POA: Insufficient documentation

## 2017-01-16 DIAGNOSIS — F329 Major depressive disorder, single episode, unspecified: Secondary | ICD-10-CM | POA: Insufficient documentation

## 2017-01-16 DIAGNOSIS — K219 Gastro-esophageal reflux disease without esophagitis: Secondary | ICD-10-CM | POA: Insufficient documentation

## 2017-01-16 DIAGNOSIS — J449 Chronic obstructive pulmonary disease, unspecified: Secondary | ICD-10-CM | POA: Insufficient documentation

## 2017-01-16 DIAGNOSIS — Z87891 Personal history of nicotine dependence: Secondary | ICD-10-CM | POA: Insufficient documentation

## 2017-01-16 DIAGNOSIS — I10 Essential (primary) hypertension: Secondary | ICD-10-CM | POA: Insufficient documentation

## 2017-01-16 DIAGNOSIS — F419 Anxiety disorder, unspecified: Secondary | ICD-10-CM | POA: Insufficient documentation

## 2017-01-26 ENCOUNTER — Other Ambulatory Visit (HOSPITAL_BASED_OUTPATIENT_CLINIC_OR_DEPARTMENT_OTHER): Payer: Medicare HMO

## 2017-01-26 ENCOUNTER — Ambulatory Visit (HOSPITAL_COMMUNITY)
Admission: RE | Admit: 2017-01-26 | Discharge: 2017-01-26 | Disposition: A | Discharge status: Home / Self Care | Payer: Medicare HMO | Source: Ambulatory Visit | Attending: Internal Medicine | Admitting: Internal Medicine

## 2017-01-26 DIAGNOSIS — J439 Emphysema, unspecified: Secondary | ICD-10-CM | POA: Diagnosis not present

## 2017-01-26 DIAGNOSIS — C3491 Malignant neoplasm of unspecified part of right bronchus or lung: Secondary | ICD-10-CM

## 2017-01-26 DIAGNOSIS — C3411 Malignant neoplasm of upper lobe, right bronchus or lung: Secondary | ICD-10-CM

## 2017-01-26 DIAGNOSIS — I700 Atherosclerosis of aorta: Secondary | ICD-10-CM | POA: Diagnosis not present

## 2017-01-26 DIAGNOSIS — Z902 Acquired absence of lung [part of]: Secondary | ICD-10-CM | POA: Insufficient documentation

## 2017-01-26 DIAGNOSIS — K449 Diaphragmatic hernia without obstruction or gangrene: Secondary | ICD-10-CM | POA: Insufficient documentation

## 2017-01-26 DIAGNOSIS — I2510 Atherosclerotic heart disease of native coronary artery without angina pectoris: Secondary | ICD-10-CM | POA: Diagnosis not present

## 2017-01-26 LAB — CBC WITH DIFFERENTIAL/PLATELET
BASO%: 0.3 % (ref 0.0–2.0)
Basophils Absolute: 0 10*3/uL (ref 0.0–0.1)
EOS%: 2.5 % (ref 0.0–7.0)
Eosinophils Absolute: 0.2 10*3/uL (ref 0.0–0.5)
HCT: 37.4 % (ref 34.8–46.6)
HGB: 12.1 g/dL (ref 11.6–15.9)
LYMPH%: 22.6 % (ref 14.0–49.7)
MCH: 28.1 pg (ref 25.1–34.0)
MCHC: 32.4 g/dL (ref 31.5–36.0)
MCV: 87 fL (ref 79.5–101.0)
MONO#: 0.6 10*3/uL (ref 0.1–0.9)
MONO%: 8.2 % (ref 0.0–14.0)
NEUT#: 4.8 10*3/uL (ref 1.5–6.5)
NEUT%: 66.4 % (ref 38.4–76.8)
Platelets: 219 10*3/uL (ref 145–400)
RBC: 4.3 10*6/uL (ref 3.70–5.45)
RDW: 17.8 % — ABNORMAL HIGH (ref 11.2–14.5)
WBC: 7.2 10*3/uL (ref 3.9–10.3)
lymph#: 1.6 10*3/uL (ref 0.9–3.3)

## 2017-01-26 LAB — COMPREHENSIVE METABOLIC PANEL
ALT: 10 U/L (ref 0–55)
AST: 16 U/L (ref 5–34)
Albumin: 3.5 g/dL (ref 3.5–5.0)
Alkaline Phosphatase: 68 U/L (ref 40–150)
Anion Gap: 10 mEq/L (ref 3–11)
BUN: 12.3 mg/dL (ref 7.0–26.0)
CO2: 26 mEq/L (ref 22–29)
Calcium: 9.2 mg/dL (ref 8.4–10.4)
Chloride: 107 mEq/L (ref 98–109)
Creatinine: 1.2 mg/dL — ABNORMAL HIGH (ref 0.6–1.1)
EGFR: 43 mL/min/{1.73_m2} — ABNORMAL LOW (ref >90)
Glucose: 81 mg/dl (ref 70–140)
Potassium: 3.7 mEq/L (ref 3.5–5.1)
Sodium: 143 mEq/L (ref 136–145)
Total Bilirubin: 0.42 mg/dL (ref 0.20–1.20)
Total Protein: 7.2 g/dL (ref 6.4–8.3)

## 2017-01-26 IMAGING — CT CT CHEST W/ CM
2 of 4 series · 15 of 36 positions shown, 18 images · IV contrast (iopamidol)
Comparison: [DATE].

CLINICAL DATA: Adenocarcinoma of the right lung, stage I. Diagnosed
[DATE] with right upper lobectomy [DATE].

EXAM:
CT CHEST WITH CONTRAST
TECHNIQUE: Multidetector CT imaging of the chest was performed during
intravenous contrast administration.
CONTRAST:  50mL [XV] IOPAMIDOL ([XV]) INJECTION 61%

[Series 2: axial st · axial · 0.74mm/px · z∈[-382,-118]mm · 12 of 155 slices shown, 15 images]
[im 12/155  mediastinal]
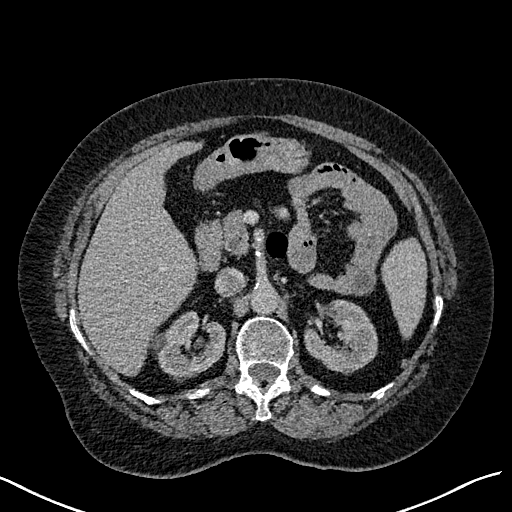
[im 12/155  lung]
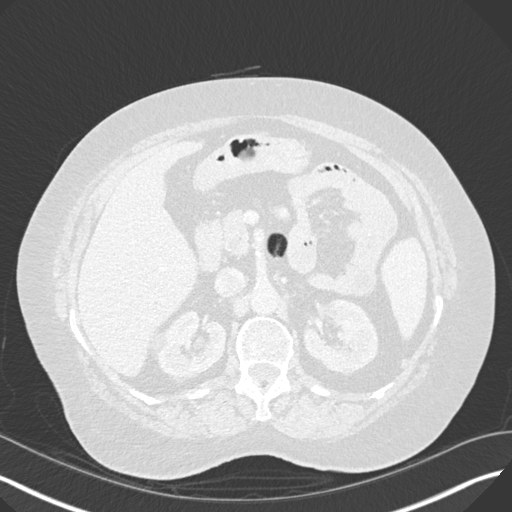
[im 23/155  lung]
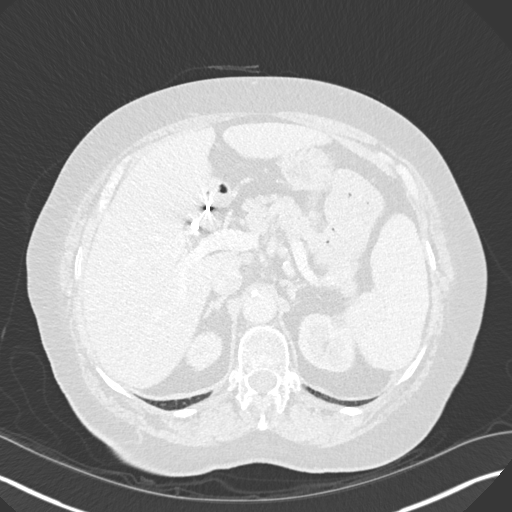
[im 34/155  lung]
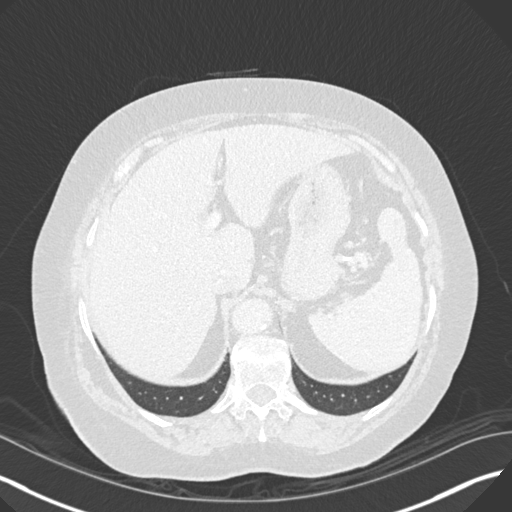
[im 45/155  lung]
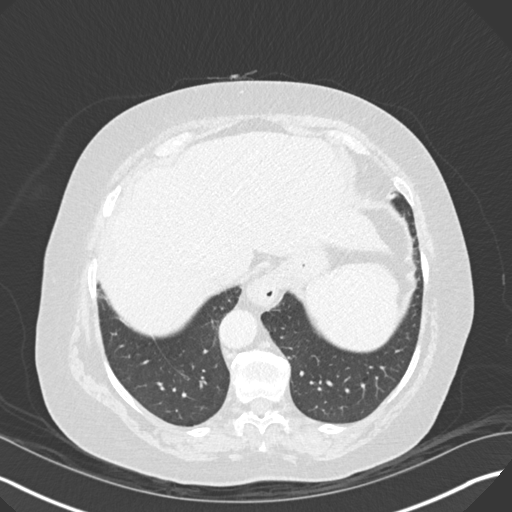
[im 56/155  mediastinal]
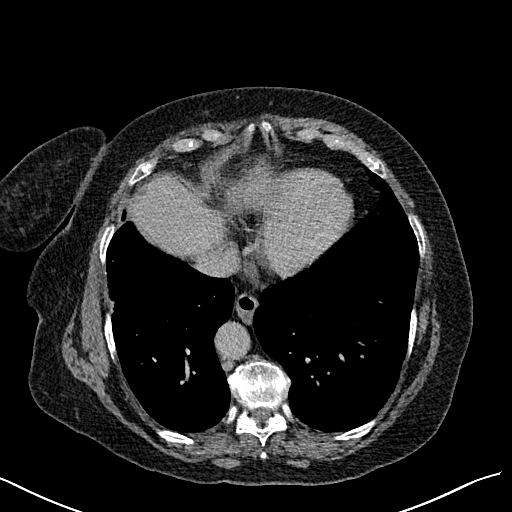
[im 56/155  lung]
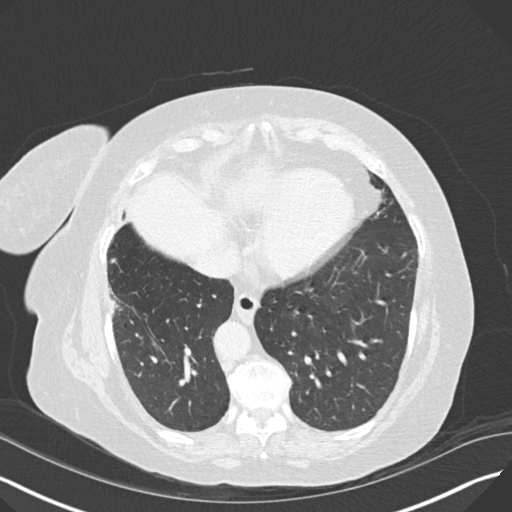
[im 67/155  lung]
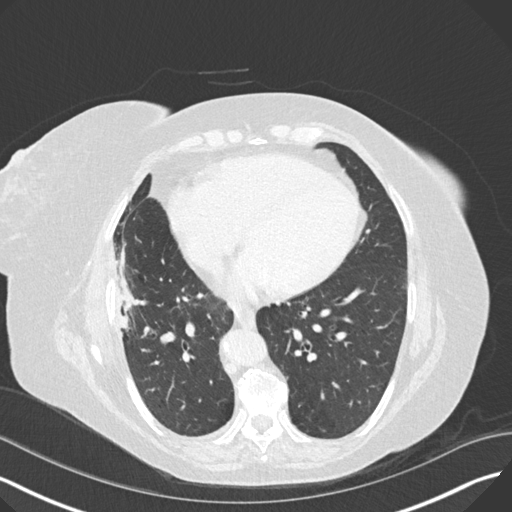
[im 89/155  lung]
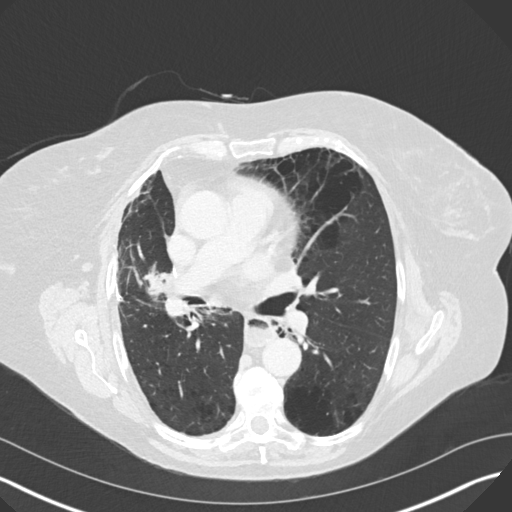
[im 100/155  lung]
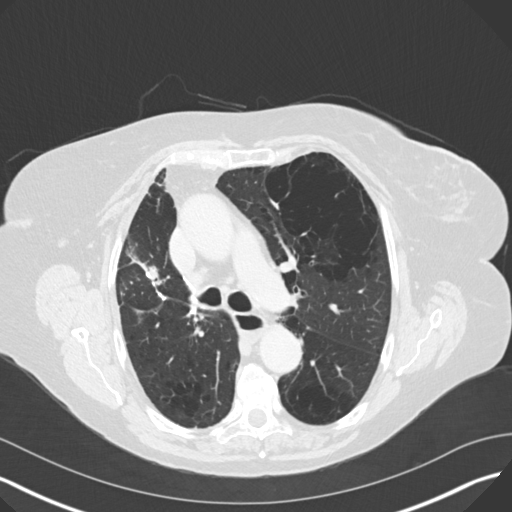
[im 111/155  mediastinal]
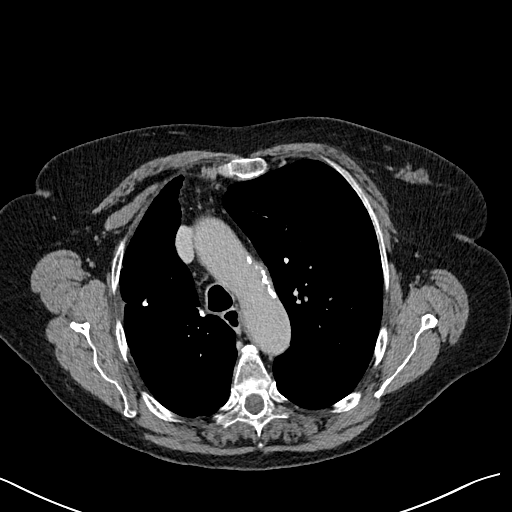
[im 111/155  lung]
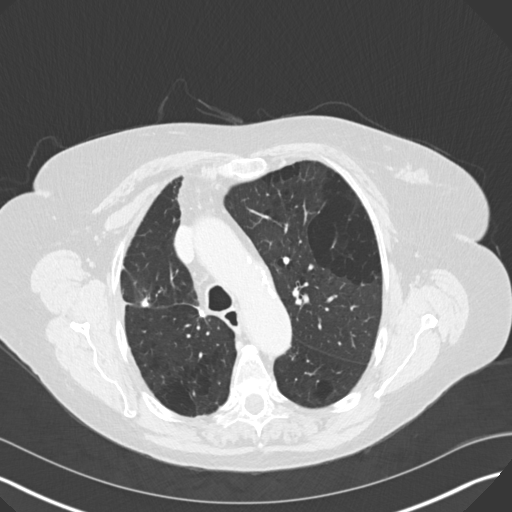
[im 122/155  lung]
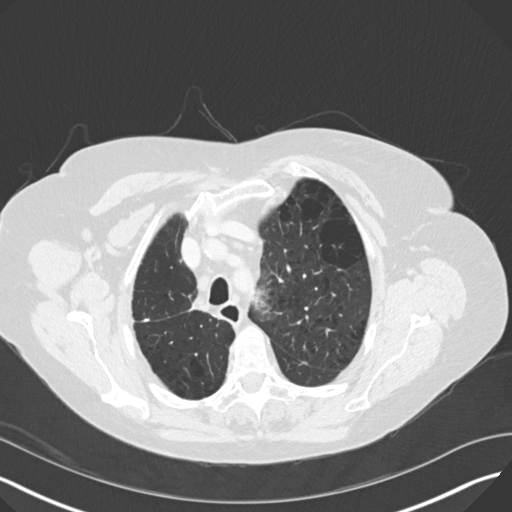
[im 133/155  lung]
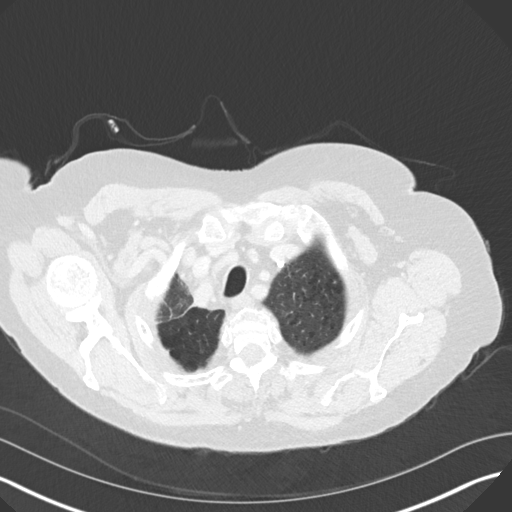
[im 144/155  lung]
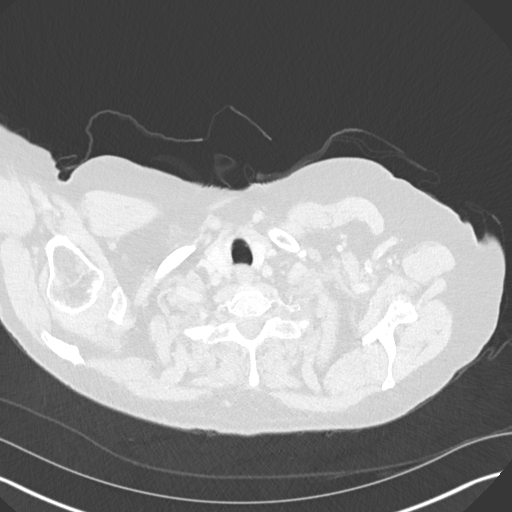

[Series 6: coronal · coronal · 0.59mm/px · 3 of 141 slices shown]
[im 29/141  lung]
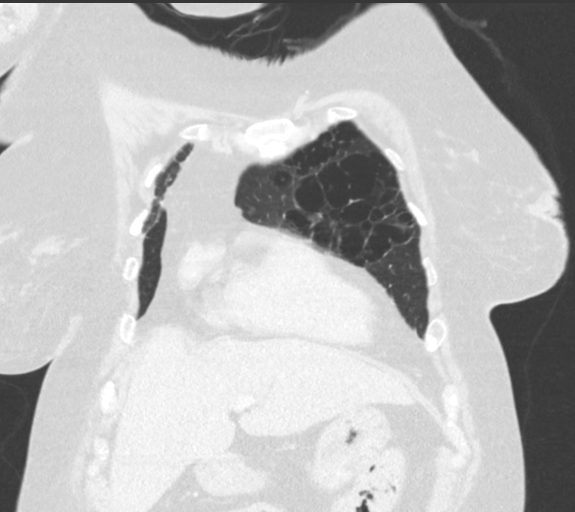
[im 57/141  lung]
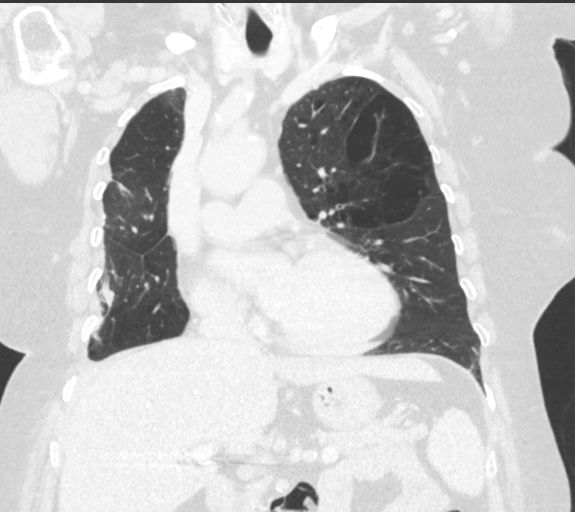
[im 85/141  lung]
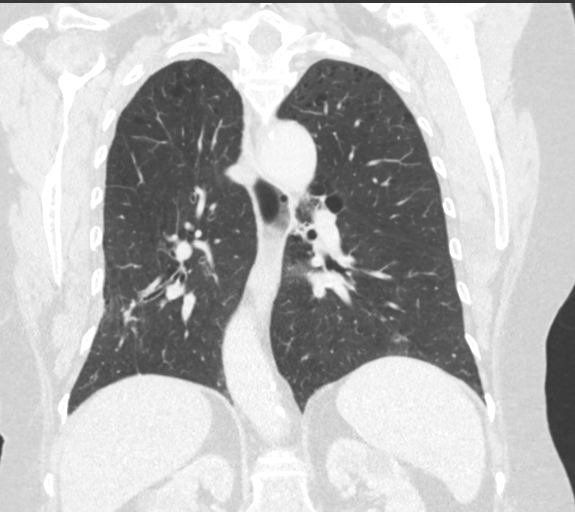

[15 of 36 positions shown; findings below may reference images not displayed]

FINDINGS: Cardiovascular: Aortic and branch vessel atherosclerosis. Tortuous
thoracic aorta. Moderate cardiomegaly, without pericardial effusion.
Right sided coronary artery atherosclerosis. No central pulmonary
embolism, on this non-dedicated study.

Mediastinum/Nodes: No supraclavicular adenopathy.

High right paratracheal node measures 7 mm on image 29/series 2 and
is unchanged.

A more inferior right paratracheal node measures 9 x 13 mm on image
38/series 2. Compare 8 x 13 mm on the prior exam. No hilar
adenopathy. Small hiatal hernia. Moderate esophageal dilatation with
fluid level within.

Lungs/Pleura: Similar mild right pleural thickening. Advanced
bullous type emphysema. Status post right upper lobectomy. Minimal
motion degradation throughout.

Right lower lobe scarring laterally. Minimal left upper lobe
nodularity, including on image 60/series 5. Felt to be similar. 4 mm
left lower lobe pulmonary nodule on image 107/series [DATE] represent
an area of mucoid impaction and was present back on [DATE].

Mild thickening along the left major fissure on image 33/series 5 is
felt to be similar.

Soft tissue thickening along the surgical sutures measures on the
order of 9 mm on image 58/series 5 and is not significantly changed.

Upper Abdomen: Caudate and lateral segment left liver lobe
prominence with medial left hepatic lobe atrophy. Cholecystectomy.
Normal imaged portions of the spleen, pancreas, adrenal glands.
Bilateral low-density renal lesions which are likely cysts.
Abdominal aortic atherosclerosis.

Musculoskeletal: Accentuation of expected thoracic kyphosis.
IMPRESSION: 1. Status post right upper lobectomy. Soft tissue thickening along
the surgical sutures is favored to be similar.
2. Similar to minimal enlargement of borderline sized mediastinal
nodes. Favored to be reactive. Recommend attention on follow-up.
3. Coronary artery atherosclerosis. Aortic Atherosclerosis
([XV]-[XV]).
4. Small hiatal hernia. Esophageal air fluid level suggests
dysmotility or gastroesophageal reflux.
5. Hepatic morphology which is suspicious for cirrhosis. Correlate
with risk factors.
6.  Emphysema ([XV]-[XV]).

## 2017-01-26 MED ORDER — IOPAMIDOL (ISOVUE-300) INJECTION 61%
75.0000 mL | Freq: Once | INTRAVENOUS | Status: AC | PRN
  Administered 2017-01-26: 50 mL via INTRAVENOUS

## 2017-01-26 MED ORDER — IOPAMIDOL (ISOVUE-300) INJECTION 61%
INTRAVENOUS | Status: AC
  Filled 2017-01-26: qty 75

## 2017-02-02 ENCOUNTER — Encounter: Payer: Self-pay | Admitting: Internal Medicine

## 2017-02-02 ENCOUNTER — Ambulatory Visit (HOSPITAL_BASED_OUTPATIENT_CLINIC_OR_DEPARTMENT_OTHER): Payer: Medicare HMO | Admitting: Internal Medicine

## 2017-02-02 VITALS — BP 147/103 | HR 65 | Temp 98.8°F | Resp 17 | Ht 62.0 in | Wt 157.0 lb

## 2017-02-02 DIAGNOSIS — I10 Essential (primary) hypertension: Secondary | ICD-10-CM

## 2017-02-02 DIAGNOSIS — Z85118 Personal history of other malignant neoplasm of bronchus and lung: Secondary | ICD-10-CM | POA: Diagnosis not present

## 2017-02-02 DIAGNOSIS — C3491 Malignant neoplasm of unspecified part of right bronchus or lung: Secondary | ICD-10-CM

## 2017-02-02 NOTE — Progress Notes (Signed)
[facility] Telephone:([phone]   [phone]  OFFICE PROGRESS NOTE  Wenda Low, MD [address] [place] [address]  DIAGNOSIS: Stage IA (T1a, N0, M0) non-small cell lung cancer, adenocarcinoma presented with right upper lobe pulmonary nodule  PRIOR THERAPY: Status post right upper lobectomy with lymph node dissection on 05/09/2016 by Dr. Roxan Hockey.  CURRENT THERAPY: Observation.  INTERVAL HISTORY: [name] 76 y.o. female returns to the clinic today for follow-up visit accompanied by 2 family members. The patient is feeling fine today with no specific complaints except for intermittent pain on the right side of the chest from the surgical scar. She denied having any cough or hemoptysis. She continues to have shortness breath with exertion. She lost few pounds since her last visit. She denied having any fever or chills. She has no nausea, vomiting, diarrhea or constipation. She had repeat CT scan of the chest performed recently and she is here for evaluation and discussion of her scan results.  MEDICAL HISTORY: Past Medical History:  Diagnosis Date  . Adenocarcinoma of right lung, stage 1 ([place]) 07/28/2016  . Anxiety   . Cancer ([place])    uterine  . COPD (chronic obstructive pulmonary disease) ([place])   . Cyst    left side of neck  . Depression   . GERD (gastroesophageal reflux disease)   . Heart murmur    per patient  . Hyperlipidemia   . Hypertension   . Low back pain   . Lumbar back pain   . Osteoarthritis   . Osteoporosis     ALLERGIES:  is allergic to boniva [ibandronate sodium] and nitrofurantoin monohyd macro.  MEDICATIONS:  Current Outpatient Prescriptions  Medication Sig Dispense Refill  . albuterol (PROVENTIL HFA;VENTOLIN HFA) 108 (90 BASE) MCG/ACT inhaler Inhale 2 puffs into the lungs every 6 (six) hours as needed. (Patient taking differently: Inhale 2 puffs into the lungs every 4 (four) hours as needed for  wheezing or shortness of breath. ) 18 g 1  . albuterol (PROVENTIL) (2.5 MG/3ML) 0.083% nebulizer solution Take 3 mLs (2.5 mg total) by nebulization every 2 (two) hours as needed for wheezing or shortness of breath. 75 mL 12  . amLODipine (NORVASC) 2.5 MG tablet Take 5 mg by mouth at bedtime.     Marland Kitchen atorvastatin (LIPITOR) 20 MG tablet Take 20 mg by mouth daily.     . diazepam (VALIUM) 5 MG tablet Take 5 mg by mouth at bedtime as needed for anxiety (for nerves).     . Fluticasone-Salmeterol (ADVAIR DISKUS) 250-50 MCG/DOSE AEPB Inhale 1 puff into the lungs every 12 (twelve) hours. 60 each 0  . gabapentin (NEURONTIN) 300 MG capsule Take 2 capsules (600 mg total) by mouth 3 (three) times daily. 180 capsule 1  . Glucosamine HCl (GLUCOSAMINE PO) Take 1 tablet by mouth daily.    Marland Kitchen lisinopril (PRINIVIL,ZESTRIL) 30 MG tablet Take 30 mg by mouth every morning.    . Magnesium 250 MG TABS Take 250 mg by mouth daily.    . metoCLOPramide (REGLAN) 5 MG tablet Take 5 mg by mouth 2 (two) times daily.     . pantoprazole (PROTONIX) 40 MG tablet Take 1 tablet (40 mg total) by mouth daily at 6 (six) AM. 30 tablet 0  . raloxifene (EVISTA) 60 MG tablet Take 60 mg by mouth daily.    . sertraline (ZOLOFT) 100 MG tablet Take 100 mg by mouth daily.    Marland Kitchen  tiotropium (SPIRIVA) 18 MCG inhalation capsule Place 18 mcg into inhaler and inhale daily.       No current facility-administered medications for this visit.     SURGICAL HISTORY:  Past Surgical History:  Procedure Laterality Date  . ANKLE SURGERY Right    horse accident  . APPENDECTOMY    . BACK SURGERY    . CHOLECYSTECTOMY    . EYE SURGERY     lense implant  . FRACTURE SURGERY    . HIP SURGERY  01/2010   Left Hip   . INNER EAR SURGERY Bilateral 1970   related to severe ear infections  . KNEE SURGERY     Right knee  . LOBECTOMY Right 05/09/2016   Procedure: RIGHT UPPER LOBECTOMY;  Surgeon: [name], MD;  Location: [place];  Service: Thoracic;   Laterality: Right;  . MASS EXCISION  08/25/2011   Procedure: EXCISION MASS;  Surgeon: Harl Bowie, MD;  Location: [place];  Service: General;  Laterality: N/A;  excision left neck mass  . TOTAL ABDOMINAL HYSTERECTOMY    . VIDEO ASSISTED THORACOSCOPY (VATS)/WEDGE RESECTION Right 05/09/2016   Procedure: VIDEO ASSISTED THORACOSCOPY (VATS)/WEDGE RESECTION;  Surgeon: [name], MD;  Location: [place];  Service: Thoracic;  Laterality: Right;    REVIEW OF SYSTEMS:  A comprehensive review of systems was negative except for: Respiratory: positive for dyspnea on exertion and pleurisy/chest pain   PHYSICAL EXAMINATION: General appearance: alert, cooperative and no distress Head: Normocephalic, without obvious abnormality, atraumatic Neck: no adenopathy, no JVD, supple, symmetrical, trachea midline and thyroid not enlarged, symmetric, no tenderness/mass/nodules Lymph nodes: Cervical, supraclavicular, and axillary nodes normal. Resp: clear to auscultation bilaterally Back: symmetric, no curvature. ROM normal. No CVA tenderness. Cardio: regular rate and rhythm, S1, S2 normal, no murmur, click, rub or gallop GI: soft, non-tender; bowel sounds normal; no masses,  no organomegaly Extremities: extremities normal, atraumatic, no cyanosis or edema  ECOG PERFORMANCE STATUS: 1 - Symptomatic but completely ambulatory  Blood pressure (!) 147/103, pulse 65, temperature 98.8 F (37.1 C), temperature source Oral, resp. rate 17, height 5\' 2"  (1.575 m), weight 157 lb (71.2 kg), SpO2 100 %.  LABORATORY DATA: Lab Results  Component Value Date   WBC 7.2 01/26/2017   HGB 12.1 01/26/2017   HCT 37.4 01/26/2017   MCV 87.0 01/26/2017   PLT 219 01/26/2017      Chemistry      Component Value Date/Time   NA 143 01/26/2017 0902   K 3.7 01/26/2017 0902   CL 108 05/14/2016 0500   CO2 26 01/26/2017 0902   BUN 12.3 01/26/2017 0902   CREATININE 1.2 (H) 01/26/2017 0902      Component  Value Date/Time   CALCIUM 9.2 01/26/2017 0902   ALKPHOS 68 01/26/2017 0902   AST 16 01/26/2017 0902   ALT 10 01/26/2017 0902   BILITOT 0.42 01/26/2017 0902       RADIOGRAPHIC STUDIES: Ct Chest W Contrast  Result Date: 01/26/2017 CLINICAL DATA:  Adenocarcinoma of the right lung, stage I. Diagnosed 10/17 with right upper lobectomy 05/09/2016. EXAM: CT CHEST WITH CONTRAST TECHNIQUE: Multidetector CT imaging of the chest was performed during intravenous contrast administration. CONTRAST:  68mL ISOVUE-300 IOPAMIDOL (ISOVUE-300) INJECTION 61% COMPARISON:  12/20/2016. FINDINGS: Cardiovascular: Aortic and branch vessel atherosclerosis. Tortuous thoracic aorta. Moderate cardiomegaly, without pericardial effusion. Right sided coronary artery atherosclerosis. No central pulmonary embolism, on this non-dedicated study. Mediastinum/Nodes: No supraclavicular adenopathy. High right paratracheal node measures 7 mm on  image 29/series 2 and is unchanged. A more inferior right paratracheal node measures 9 x 13 mm on image 38/series 2. Compare 8 x 13 mm on the prior exam. No hilar adenopathy. Small hiatal hernia. Moderate esophageal dilatation with fluid level within. Lungs/Pleura: Similar mild right pleural thickening. Advanced bullous type emphysema. Status post right upper lobectomy. Minimal motion degradation throughout. Right lower lobe scarring laterally. Minimal left upper lobe nodularity, including on image 60/series 5. Felt to be similar. 4 mm left lower lobe pulmonary nodule on image 107/series 5 may represent an area of mucoid impaction and was present back on 03/24/2016. Mild thickening along the left major fissure on image 33/series 5 is felt to be similar. Soft tissue thickening along the surgical sutures measures on the order of 9 mm on image 58/series 5 and is not significantly changed. Upper Abdomen: Caudate and lateral segment left liver lobe prominence with medial left hepatic lobe atrophy.  Cholecystectomy. Normal imaged portions of the spleen, pancreas, adrenal glands. Bilateral low-density renal lesions which are likely cysts. Abdominal aortic atherosclerosis. Musculoskeletal: Accentuation of expected thoracic kyphosis. IMPRESSION: 1. Status post right upper lobectomy. Soft tissue thickening along the surgical sutures is favored to be similar. 2. Similar to minimal enlargement of borderline sized mediastinal nodes. Favored to be reactive. Recommend attention on follow-up. 3. Coronary artery atherosclerosis. Aortic Atherosclerosis (ICD10-I70.0). 4. Small hiatal hernia. Esophageal air fluid level suggests dysmotility or gastroesophageal reflux. 5. Hepatic morphology which is suspicious for cirrhosis. Correlate with risk factors. 6.  Emphysema (ICD10-J43.9). Electronically Signed   By: Abigail Miyamoto M.D.   On: 01/26/2017 12:01    ASSESSMENT AND PLAN: This is a very pleasant 76 years old white female with a stage IA non-small cell lung cancer status post right upper lobectomy with lymph node dissection and currently on observation. The patient is feeling fine and no evidence for disease recurrence on the recent CT scan of the chest. I discussed the scan results with the patient and her family and recommended for her to continue on observation with repeat CT scan of the chest in 6 months. For hypertension, she was advised to take her blood pressure medication as prescribed and to reconsult with her primary care physician for adjustment of her medications. The patient was advised to call immediately if she has any concerning symptoms in the interval. The patient voices understanding of current disease status and treatment options and is in agreement with the current care plan. All questions were answered. The patient knows to call the clinic with any problems, questions or concerns. We can certainly see the patient much sooner if necessary.  I spent 10 minutes counseling the patient face to face.  The total time spent in the appointment was 15 minutes.  Disclaimer: This note was dictated with voice recognition software. Similar sounding words can inadvertently be transcribed and may not be corrected upon review.

## 2017-02-25 ENCOUNTER — Other Ambulatory Visit: Payer: Self-pay | Admitting: Physical Medicine & Rehabilitation

## 2017-03-03 ENCOUNTER — Encounter: Payer: Medicare HMO | Attending: Physical Medicine & Rehabilitation

## 2017-03-03 ENCOUNTER — Encounter: Payer: Self-pay | Admitting: Physical Medicine & Rehabilitation

## 2017-03-03 ENCOUNTER — Ambulatory Visit (HOSPITAL_BASED_OUTPATIENT_CLINIC_OR_DEPARTMENT_OTHER): Payer: Medicare HMO | Admitting: Physical Medicine & Rehabilitation

## 2017-03-03 VITALS — BP 117/69 | HR 75

## 2017-03-03 DIAGNOSIS — G588 Other specified mononeuropathies: Secondary | ICD-10-CM | POA: Diagnosis not present

## 2017-03-03 DIAGNOSIS — J449 Chronic obstructive pulmonary disease, unspecified: Secondary | ICD-10-CM | POA: Insufficient documentation

## 2017-03-03 DIAGNOSIS — I10 Essential (primary) hypertension: Secondary | ICD-10-CM | POA: Diagnosis not present

## 2017-03-03 DIAGNOSIS — G8922 Chronic post-thoracotomy pain: Secondary | ICD-10-CM | POA: Diagnosis not present

## 2017-03-03 DIAGNOSIS — K219 Gastro-esophageal reflux disease without esophagitis: Secondary | ICD-10-CM | POA: Diagnosis not present

## 2017-03-03 DIAGNOSIS — M961 Postlaminectomy syndrome, not elsewhere classified: Secondary | ICD-10-CM | POA: Diagnosis not present

## 2017-03-03 DIAGNOSIS — F329 Major depressive disorder, single episode, unspecified: Secondary | ICD-10-CM | POA: Diagnosis not present

## 2017-03-03 DIAGNOSIS — Z87891 Personal history of nicotine dependence: Secondary | ICD-10-CM | POA: Insufficient documentation

## 2017-03-03 DIAGNOSIS — E785 Hyperlipidemia, unspecified: Secondary | ICD-10-CM | POA: Diagnosis not present

## 2017-03-03 DIAGNOSIS — F419 Anxiety disorder, unspecified: Secondary | ICD-10-CM | POA: Diagnosis not present

## 2017-03-03 MED ORDER — PREGABALIN 75 MG PO CAPS: 75.0000 mg | ORAL_CAPSULE | Freq: Two times a day (BID) | ORAL | 1 refills | Status: DC

## 2017-03-03 NOTE — Patient Instructions (Signed)
Please discontinue the gabapentin  Will start Lyrica 75 mg twice a day in place of the gabapentin  We will do injection next visit  We will give Valium 5 mg prior to the injection

## 2017-03-03 NOTE — Progress Notes (Signed)
Subjective:    Patient ID: [name], female    DOB: [birth year], 76 y.o.   MRN: [id number] 76 year old female with right upper lobectomy for stage IA. Adeno CA of the lung October 2017.  Patient has developed right-sided rib pain , Chronic postoperative period, referred by thoracic surgery for intercostal nerve block HPI Increased gabapentin to 600 mg 3 times a day. Some improvement with pain but still complains of pain. Is able to wear her bra. Get some partial relief with Tylenol.  No drowsiness with the gabapentin. She also is taking diazepam prescribed by one of her other physicians Pain Inventory Average Pain 8 Pain Right Now 8 My pain is sharp, burning, tingling and aching  In the last 24 hours, has pain interfered with the following? General activity 7 Relation with others 7 Enjoyment of life 7 What TIME of day is your pain at its worst? daytime Sleep (in general) Poor  Pain is worse with: walking, bending, sitting, standing and some activites Pain improves with: rest and nothing really Relief from Meds: no pain med  Mobility walk without assistance ability to climb steps?  yes do you drive?  yes  Function retired  Neuro/Psych weakness tingling trouble walking dizziness confusion  Prior Studies Any changes since last visit?  no  Physicians involved in your care Any changes since last visit?  no   Family History  Problem Relation Age of Onset  . Heart disease Mother   . Heart disease Father   . Cancer Sister        #1, breast  . Heart disease Brother        had CABG   Social History   Social History  . Marital status: Widowed    Spouse name: N/A  . Number of children: N/A  . Years of education: N/A   Social History Main Topics  . Smoking status: Former Smoker    Packs/day: 2.00    Years: 51.00    Types: Cigarettes    Quit date: 05/04/2012  . Smokeless tobacco: Never Used  . Alcohol use No  . Drug use: No  . Sexual activity: Yes     Birth control/ protection: Other-see comments   Other Topics Concern  . None   Social History Narrative   Single, lives alone with 2 dogs   3 children   Retired: several careers included Regulatory affairs officer, diners, truck stops   Works now on the weekends at [employer]   Past Surgical History:  Procedure Laterality Date  . ANKLE SURGERY Right    horse accident  . APPENDECTOMY    . BACK SURGERY    . CHOLECYSTECTOMY    . EYE SURGERY     lense implant  . FRACTURE SURGERY    . HIP SURGERY  01/2010   Left Hip   . INNER EAR SURGERY Bilateral 1970   related to severe ear infections  . KNEE SURGERY     Right knee  . LOBECTOMY Right 05/09/2016   Procedure: RIGHT UPPER LOBECTOMY;  Surgeon: [name], MD;  Location: [place];  Service: Thoracic;  Laterality: Right;  . MASS EXCISION  08/25/2011   Procedure: EXCISION MASS;  Surgeon: Harl Bowie, MD;  Location: [place];  Service: General;  Laterality: N/A;  excision left neck mass  . TOTAL ABDOMINAL HYSTERECTOMY    . VIDEO ASSISTED THORACOSCOPY (VATS)/WEDGE RESECTION Right 05/09/2016   Procedure: VIDEO ASSISTED THORACOSCOPY (VATS)/WEDGE RESECTION;  Surgeon: [name],  MD;  Location: [place];  Service: Thoracic;  Laterality: Right;   Past Medical History:  Diagnosis Date  . Adenocarcinoma of right lung, stage 1 ([place]) 07/28/2016  . Anxiety   . Cancer ([place])    uterine  . COPD (chronic obstructive pulmonary disease) ([place])   . Cyst    left side of neck  . Depression   . GERD (gastroesophageal reflux disease)   . Heart murmur    per patient  . Hyperlipidemia   . Hypertension   . Low back pain   . Lumbar back pain   . Osteoarthritis   . Osteoporosis    BP 117/69   Pulse 75   SpO2 95%   Opioid Risk Score:  4 Fall Risk Score:  `1  Depression screen PHQ 2/9  Depression screen [facility] 2/9 03/03/2017 12/09/2016 06/28/2016 05/31/2016 03/18/2016 09/30/2015 08/17/2015  Decreased Interest 3 1 0 0 0 0 0  Down,  Depressed, Hopeless 3 1 0 0 0 1 1  PHQ - 2 Score 6 2 0 0 0 1 1  Altered sleeping - 2 - - - - -  Tired, decreased energy - 2 - - - - -  Change in appetite - 3 - - - - -  Feeling bad or failure about yourself  - 2 - - - - -  Trouble concentrating - 2 - - - - -  Moving slowly or fidgety/restless - 2 - - - - -  Suicidal thoughts - 0 - - - - -  PHQ-9 Score - 15 - - - - -  Difficult doing work/chores - - - - - - -   Review of Systems  Constitutional: Positive for diaphoresis.  HENT: Negative.   Eyes: Negative.   Respiratory: Positive for shortness of breath and wheezing.   Cardiovascular: Positive for leg swelling.  Gastrointestinal: Positive for abdominal pain, nausea and vomiting.  Endocrine: Negative.   Genitourinary: Negative.   Musculoskeletal: Positive for back pain and gait problem.  Skin: Negative.   Neurological: Positive for dizziness.       Tingling  Hematological: Bruises/bleeds easily.  Psychiatric/Behavioral: Positive for confusion and dysphoric mood.  All other systems reviewed and are negative.      Objective:   Physical Exam  Constitutional: She is oriented to person, place, and time. She appears well-developed and well-nourished.  HENT:  Head: Normocephalic and atraumatic.  Eyes: Pupils are equal, round, and reactive to light. Conjunctivae and EOM are normal.  Neck: Normal range of motion.  Neurological: She is alert and oriented to person, place, and time. Coordination and gait normal.  Hypersensitivity to touch over the mid axillary line, mid thoracic area on the right side.  Psychiatric: Her mood appears anxious. Her speech is rapid and/or pressured. She is hyperactive.  Nursing note and vitals reviewed.         Assessment & Plan:  1. 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 The lung almost one year postop. We'll trial Lyrica 75 mg twice a day in place of gabapentin 600 3 times a day. We will likely need to titrate  upward. Schedule for intercostal nerve block. We'll likely need to do 2 or 3 levels. Advised patient to take Valium 5 mg prior to the procedure. This will be done under ultrasound guidance

## 2017-03-10 DIAGNOSIS — R202 Paresthesia of skin: Secondary | ICD-10-CM | POA: Diagnosis not present

## 2017-03-10 DIAGNOSIS — G8929 Other chronic pain: Secondary | ICD-10-CM | POA: Diagnosis not present

## 2017-03-13 ENCOUNTER — Telehealth: Payer: Self-pay | Admitting: *Deleted

## 2017-03-13 NOTE — Telephone Encounter (Signed)
[name] had called and states that she is having difficulties with "the medications he put me on"  Stating that the first bottle did ok but the second one she has been vomiting and cant keep anything down. She says she is also having migraines, dizziness, and blurry vision.  She is afraid to drive. She does not want to be on this medication.

## 2017-03-13 NOTE — Telephone Encounter (Signed)
Discontinue Lyrica and resume gabapentin 600 mg 3 times a day

## 2017-03-14 MED ORDER — GABAPENTIN 300 MG PO CAPS: 600.0000 mg | ORAL_CAPSULE | Freq: Three times a day (TID) | ORAL | 0 refills | Status: DC

## 2017-03-14 NOTE — Telephone Encounter (Signed)
Mrs Grawe notified and order for gabapentin sent to pharmacy. Lyrica added to allergy list due to intolerance/side effects.

## 2017-03-29 ENCOUNTER — Telehealth: Payer: Self-pay | Admitting: *Deleted

## 2017-03-29 NOTE — Telephone Encounter (Signed)
Patient left a message stating that someone called, could not understand message, something to do with her appointment. Please call back.  I returned the phone call and confirmed with patient her upcoming appointment with Dr. Letta Pate for an intercostal nerve block under ultrasound guidance.  Patient verbalized understanding.

## 2017-03-31 ENCOUNTER — Encounter: Payer: Medicare HMO | Attending: Physical Medicine & Rehabilitation

## 2017-03-31 ENCOUNTER — Encounter: Payer: Self-pay | Admitting: Physical Medicine & Rehabilitation

## 2017-03-31 ENCOUNTER — Telehealth: Payer: Self-pay | Admitting: *Deleted

## 2017-03-31 ENCOUNTER — Ambulatory Visit: Payer: Medicare HMO | Admitting: Physical Medicine & Rehabilitation

## 2017-03-31 VITALS — BP 124/79 | HR 101

## 2017-03-31 DIAGNOSIS — F419 Anxiety disorder, unspecified: Secondary | ICD-10-CM | POA: Insufficient documentation

## 2017-03-31 DIAGNOSIS — Z87891 Personal history of nicotine dependence: Secondary | ICD-10-CM | POA: Insufficient documentation

## 2017-03-31 DIAGNOSIS — J449 Chronic obstructive pulmonary disease, unspecified: Secondary | ICD-10-CM | POA: Insufficient documentation

## 2017-03-31 DIAGNOSIS — E785 Hyperlipidemia, unspecified: Secondary | ICD-10-CM | POA: Insufficient documentation

## 2017-03-31 DIAGNOSIS — G588 Other specified mononeuropathies: Secondary | ICD-10-CM | POA: Insufficient documentation

## 2017-03-31 DIAGNOSIS — F329 Major depressive disorder, single episode, unspecified: Secondary | ICD-10-CM | POA: Insufficient documentation

## 2017-03-31 DIAGNOSIS — I10 Essential (primary) hypertension: Secondary | ICD-10-CM | POA: Insufficient documentation

## 2017-03-31 DIAGNOSIS — K219 Gastro-esophageal reflux disease without esophagitis: Secondary | ICD-10-CM | POA: Insufficient documentation

## 2017-03-31 DIAGNOSIS — G8922 Chronic post-thoracotomy pain: Secondary | ICD-10-CM

## 2017-03-31 DIAGNOSIS — M961 Postlaminectomy syndrome, not elsewhere classified: Secondary | ICD-10-CM | POA: Insufficient documentation

## 2017-03-31 MED ORDER — GABAPENTIN 300 MG PO CAPS: 600.0000 mg | ORAL_CAPSULE | Freq: Three times a day (TID) | ORAL | 0 refills | Status: DC

## 2017-03-31 NOTE — Telephone Encounter (Signed)
The patient was in the clinic today. She had a opportunity talk to me herself, I am suspicious of Olevia Bowens, given her history with our clinic, would not call and anything else, the patient had relief with gabapentin in the past. It is not 100%, but I would not expect that

## 2017-03-31 NOTE — Progress Notes (Signed)
76 year old female with right upper lobectomy for stage IA. AdenoCA of the lung October 2017.  Patient has developed right-sided rib pain , Chronic postoperative period, referred by thoracic surgery for intercostal nerve block  Patient is coughing a lot today, history of COPD we discussed that this should be rescheduled  Did not tolerate Lyrica. I will switch back to gabapentin

## 2017-03-31 NOTE — Telephone Encounter (Signed)
Caller was Olevia Bowens Felicia Caldwell's niece but was calling for, and presented herself as, [name]. (she has been a pt here in the clinic and her voice was recognized and caller ID showed Olevia Bowens) She is stating that she is in pain and Neurontin is not doing anything for her .  Please advise.

## 2017-03-31 NOTE — Patient Instructions (Signed)
Please reschedule intercostal nerve block

## 2017-04-12 ENCOUNTER — Telehealth: Payer: Self-pay | Admitting: Physical Medicine & Rehabilitation

## 2017-04-12 NOTE — Telephone Encounter (Signed)
Patient is calling requesting pain medication.  Please call patient.

## 2017-04-13 NOTE — Telephone Encounter (Signed)
I am recommending a lidocaine patch to her painful rib 12 hr per day.  THe 4%  Patch is availabe OTC.  Make sure she reschedule the intercostal nerve block

## 2017-04-14 NOTE — Telephone Encounter (Signed)
I notified [name].  She says that she is still "coughing like crazy"  I tol her if she is still coughing at the beginning of next week, she needs to be seen by PCP or Urgent Care to see if something has changed before she comes for her intercostal nerve block next Friday 04/21/17. She agrees.

## 2017-04-18 ENCOUNTER — Telehealth: Payer: Self-pay

## 2017-04-18 NOTE — Telephone Encounter (Signed)
Patient called stating she is in severe pain and is asking if there is anything she could have prior to the injection on Friday for pain or should she go to the ER, please advise

## 2017-04-18 NOTE — Telephone Encounter (Signed)
Recommend lidocaine over-the-counter patch to be placed over the ribs

## 2017-04-19 NOTE — Telephone Encounter (Signed)
I had previously recommended the patches in a previous call.  She did not get them right now because of money. She reports that she went to her pcp and got a flu shot yesterday and a Toradol shot which has helped a great deal. She is no longer coughing and will be at her 04/21/17 appt.

## 2017-04-20 NOTE — Telephone Encounter (Signed)
[name] will be in office for appt tomorrow and can discuss Aleve.

## 2017-04-20 NOTE — Telephone Encounter (Signed)
Toradol is in the same class as ibuprofen or Aleve. May take Aleve 1 tablet twice a day for the next week. I would not recommend this on a long-term basis as this may impair her kidney function

## 2017-04-21 ENCOUNTER — Ambulatory Visit: Payer: Medicare HMO | Admitting: Physical Medicine & Rehabilitation

## 2017-05-02 ENCOUNTER — Encounter: Payer: Self-pay | Admitting: Physical Medicine & Rehabilitation

## 2017-05-02 ENCOUNTER — Encounter: Payer: Medicare Other | Attending: Physical Medicine & Rehabilitation

## 2017-05-02 ENCOUNTER — Ambulatory Visit (HOSPITAL_BASED_OUTPATIENT_CLINIC_OR_DEPARTMENT_OTHER): Payer: Medicare Other | Admitting: Physical Medicine & Rehabilitation

## 2017-05-02 VITALS — BP 123/80 | HR 67 | Resp 14

## 2017-05-02 DIAGNOSIS — K219 Gastro-esophageal reflux disease without esophagitis: Secondary | ICD-10-CM | POA: Insufficient documentation

## 2017-05-02 DIAGNOSIS — I10 Essential (primary) hypertension: Secondary | ICD-10-CM | POA: Diagnosis not present

## 2017-05-02 DIAGNOSIS — F329 Major depressive disorder, single episode, unspecified: Secondary | ICD-10-CM | POA: Diagnosis not present

## 2017-05-02 DIAGNOSIS — Z87891 Personal history of nicotine dependence: Secondary | ICD-10-CM | POA: Diagnosis not present

## 2017-05-02 DIAGNOSIS — F419 Anxiety disorder, unspecified: Secondary | ICD-10-CM | POA: Insufficient documentation

## 2017-05-02 DIAGNOSIS — M961 Postlaminectomy syndrome, not elsewhere classified: Secondary | ICD-10-CM | POA: Diagnosis not present

## 2017-05-02 DIAGNOSIS — G588 Other specified mononeuropathies: Secondary | ICD-10-CM | POA: Insufficient documentation

## 2017-05-02 DIAGNOSIS — E785 Hyperlipidemia, unspecified: Secondary | ICD-10-CM | POA: Insufficient documentation

## 2017-05-02 DIAGNOSIS — J449 Chronic obstructive pulmonary disease, unspecified: Secondary | ICD-10-CM | POA: Diagnosis not present

## 2017-05-02 NOTE — Procedures (Signed)
Indication chronic radiating pain, right seventh rib, pain unresponsive to medication management including gabapentin and oxycodone, pain interferes with daily activities  Informed consent was obtained, after describing risks, benefits of the procedure with the patient. These include bleeding, bruising, infection, as well as pneumothorax. She elects to proceed  Patient placed in a left lateral decubitus position.  The painful area was scanned with ultrasound at the anterior axillary line corresponding to the area just above the healed surgical incision. Betadine prep, sterile drape. Linear transducer covered with sterile probe cover. Sterile gel utilized, 25-gauge 1.5 inch needle was used to anesthetize skin and subcutaneous tissue with 1% lidocaine times 3 mL the inferior aspect of the right seventh rib was targeted just above the flora which was visualized using ultrasound. Then a 22-gauge 50 mm Echo block needle was inserted following the prior needle track extended to just superior to the inner most intercostal muscle. Then a solution containing 1 cc of 6 mg/cc Celestone plus 3 cc of 1% lidocaine were injected. Patient tolerated procedure well, postprocedure instructions were given Postinjection, patient reported no pain, no breathing issues, ambulated with her son to check out, next appointment one month possible reinjection

## 2017-05-02 NOTE — Patient Instructions (Addendum)
Please keep track of your pain and how much of it goes away such as half of it for 3 quarters of it or all of it. The duration of the block varies from hours to days to weeks to months You will see me in one month and we will see what the effect was for you. If it was a partial effect and it wore off. I would recommend repeating. We may also need to do an additional level

## 2017-05-03 ENCOUNTER — Telehealth: Payer: Self-pay

## 2017-05-03 NOTE — Telephone Encounter (Signed)
Patient called stating that she had a nerve procedure done yesterday and that she is hurting so bad and wants Dr Read Drivers to prescribe her some pain medicine. She specifically said that she needed pain pills.

## 2017-05-03 NOTE — Telephone Encounter (Signed)
I called Oris Drone letting her know to follow up with Dr. Roxan Hockey and she said she would.

## 2017-05-03 NOTE — Telephone Encounter (Signed)
We tried the procedure that Dr Roxan Hockey requested, it apparently wasn't helpful We can cancel next appt We tried prescribing nerve pain med but was not helpful She can f/u with Dr Roxan Hockey or PCP

## 2017-05-08 ENCOUNTER — Other Ambulatory Visit: Payer: Self-pay | Admitting: Thoracic Surgery (Cardiothoracic Vascular Surgery)

## 2017-05-08 DIAGNOSIS — Z902 Acquired absence of lung [part of]: Secondary | ICD-10-CM

## 2017-05-09 ENCOUNTER — Encounter: Payer: Medicaid Other | Admitting: Thoracic Surgery (Cardiothoracic Vascular Surgery)

## 2017-05-30 ENCOUNTER — Ambulatory Visit
Admission: RE | Admit: 2017-05-30 | Discharge: 2017-05-30 | Disposition: A | Discharge status: Home / Self Care | Payer: Medicare Other | Source: Ambulatory Visit | Attending: Thoracic Surgery (Cardiothoracic Vascular Surgery) | Admitting: Thoracic Surgery (Cardiothoracic Vascular Surgery)

## 2017-05-30 ENCOUNTER — Encounter: Payer: Self-pay | Admitting: Thoracic Surgery (Cardiothoracic Vascular Surgery)

## 2017-05-30 ENCOUNTER — Ambulatory Visit (INDEPENDENT_AMBULATORY_CARE_PROVIDER_SITE_OTHER): Payer: Medicare Other | Admitting: Thoracic Surgery (Cardiothoracic Vascular Surgery)

## 2017-05-30 VITALS — BP 114/80 | HR 84 | Ht 62.0 in | Wt 160.0 lb

## 2017-05-30 DIAGNOSIS — C3491 Malignant neoplasm of unspecified part of right bronchus or lung: Secondary | ICD-10-CM | POA: Diagnosis not present

## 2017-05-30 DIAGNOSIS — Z902 Acquired absence of lung [part of]: Secondary | ICD-10-CM

## 2017-05-30 IMAGING — CR DG CHEST 2V
2 series · 2 of 2 positions shown · non-contrast
Comparison: [DATE]

CLINICAL DATA: Post VATS for stage I RIGHT lung cancer, RIGHT chest
pain, hypertension, COPD, former smoker

EXAM:
CHEST  2 VIEW

[w chest pa]
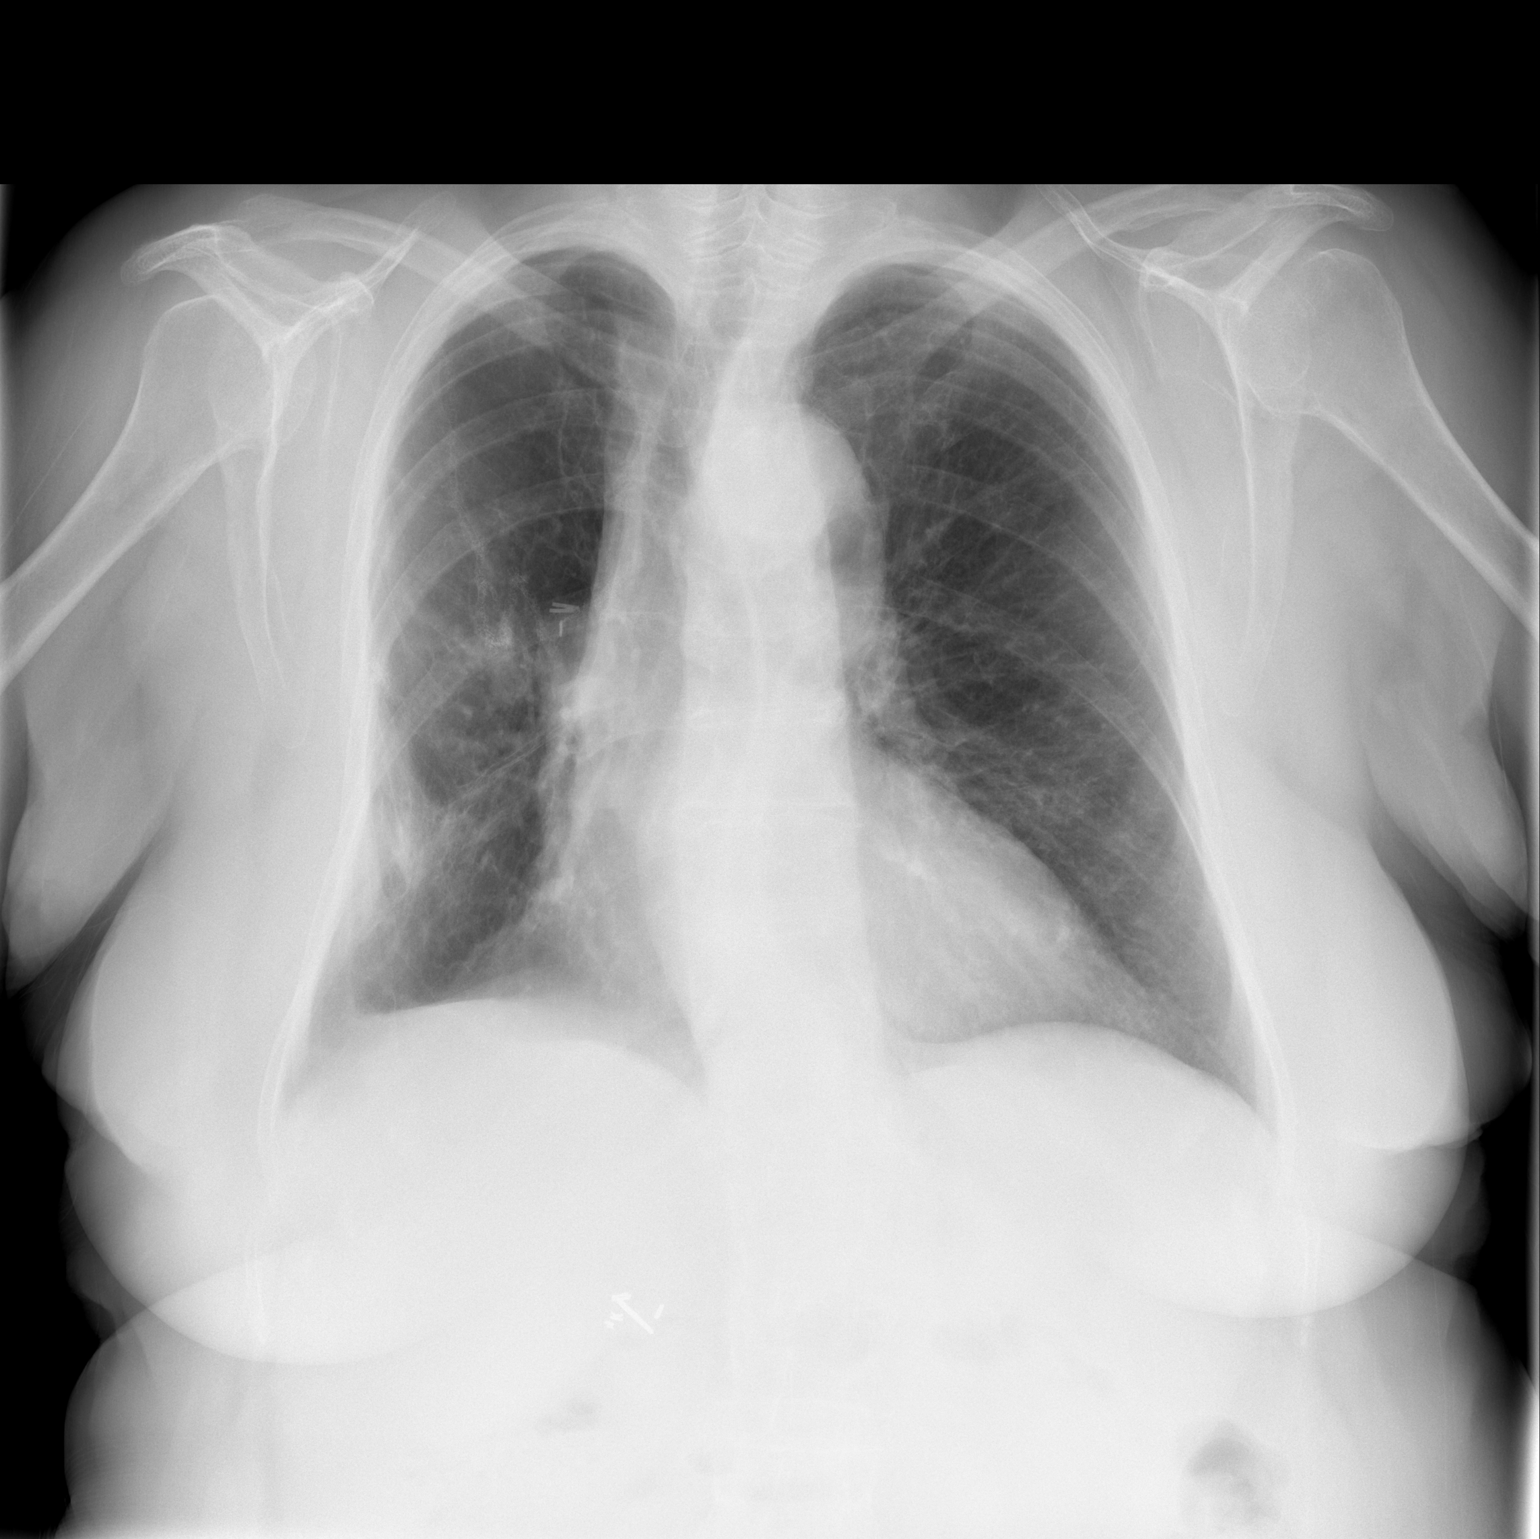

[w chest lat]
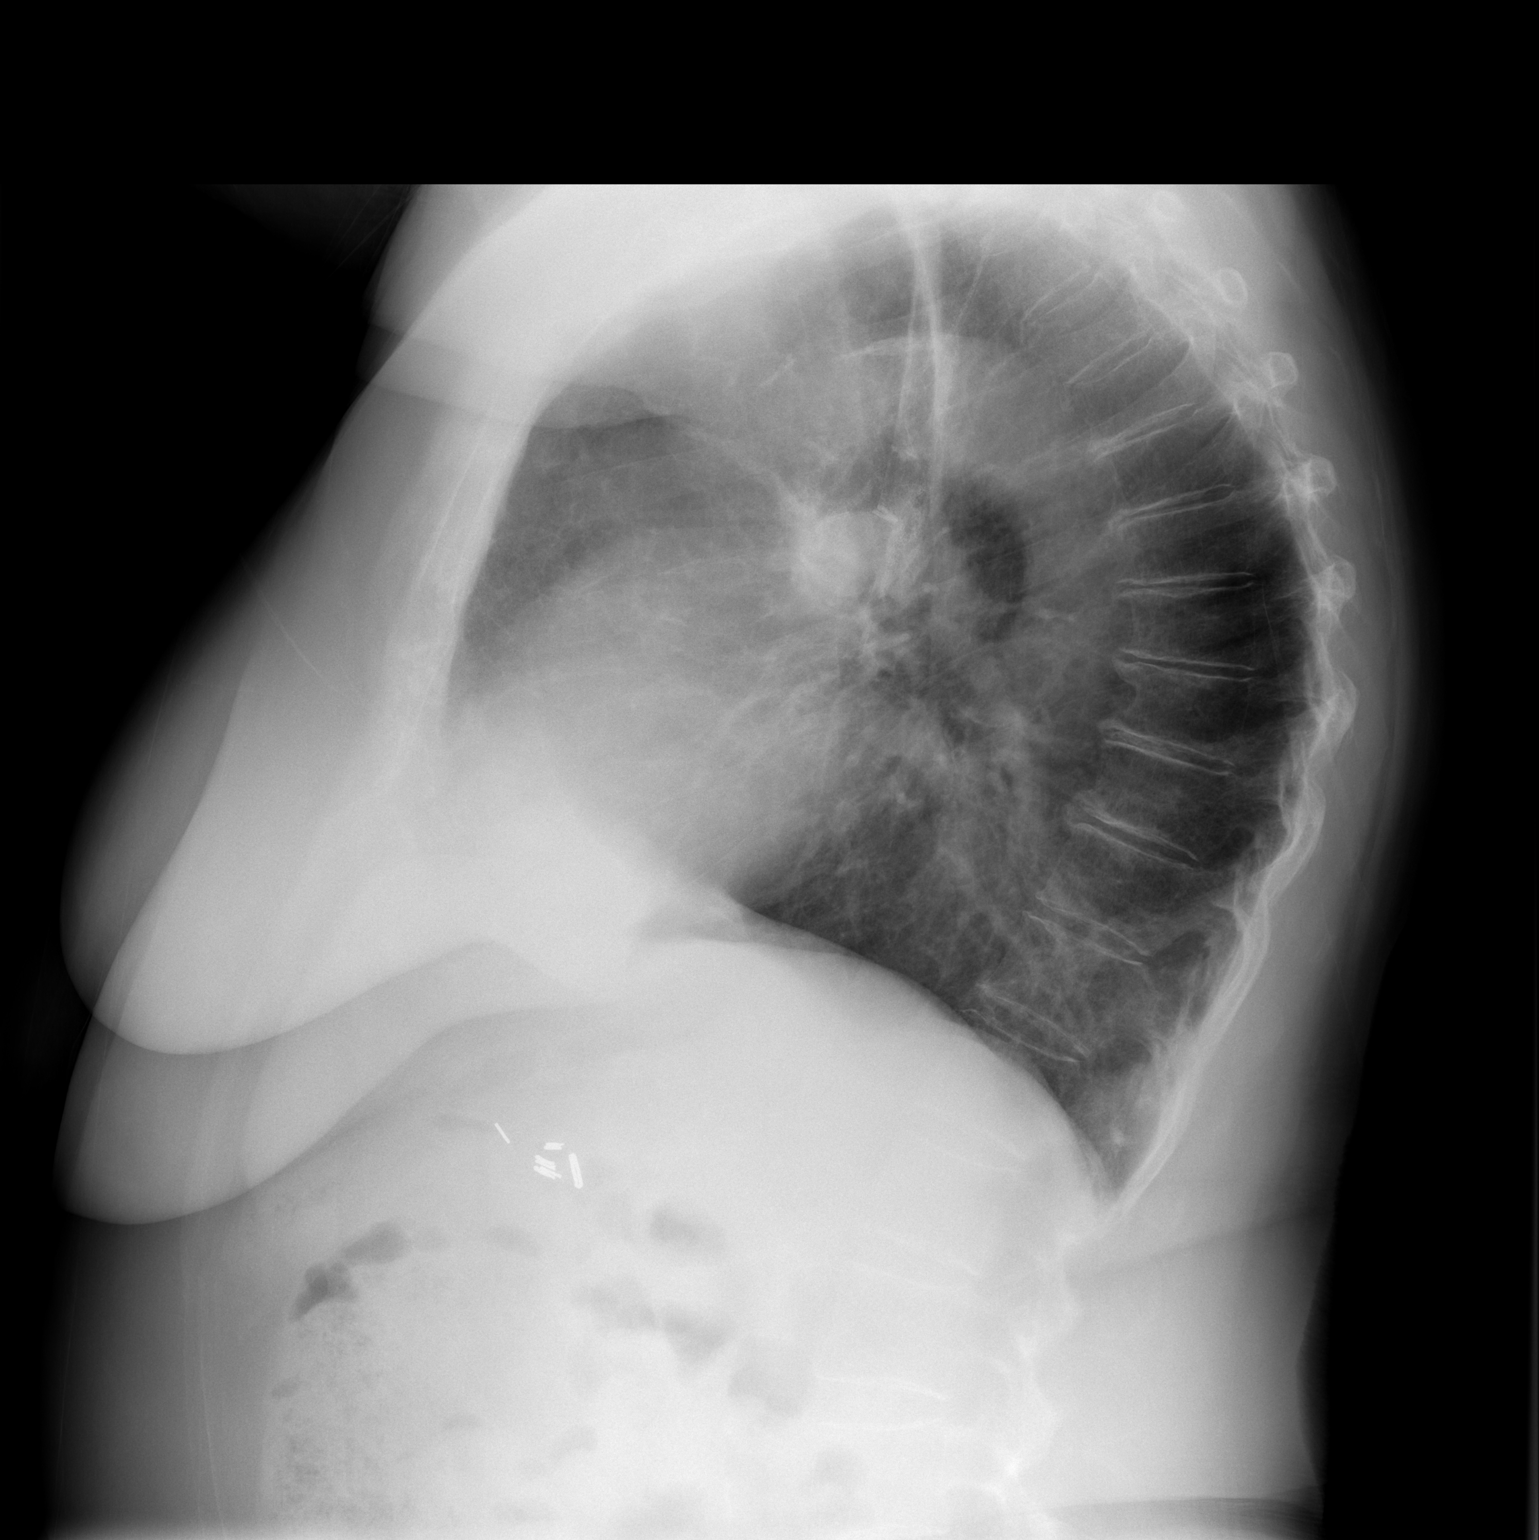

[2 of 2 positions shown; findings below may reference images not displayed]

FINDINGS: Enlargement of cardiac silhouette.

Mediast pulmonary vascularity normal.

Surgical clips and postsurgical changes at RIGHT hilum/RIGHT upper
lobe.

Scarring at lateral RIGHT base.

Volume loss in RIGHT hemithorax with slight mediastinal shift to the
RIGHT.

Underlying emphysematous changes.

No pulmonary infiltrate, pleural effusion or pneumothorax.

Bones demineralized.

Surgical clips RIGHT upper quadrant question cholecystectomy.
IMPRESSION: COPD changes with stable postsurgical changes in the RIGHT
hemithorax and associated volume loss RIGHT chest.

No acute abnormalities.

## 2017-05-30 NOTE — Progress Notes (Signed)
[address]       [place] [address]             [phone]     HPI: [name] returns for a scheduled follow-up visit.  76 year old woman who had a thoracoscopic right upper lobectomy for stage IA adenocarcinoma in October 2017.  She did well with surgery but has had persistent severe neuropathic pain since then.  I last saw her in April.  She was doing well except for the pain at that time.  She was referred to a pain clinic.  They did a nerve block but she says it wore off the next day and she canceled any further appointments.  Saw Dr. Julien Nordmann in July.  A CT showed no evidence of recurrent disease.  She says that she continues to have severe pain.  She says it has been there every day since her surgery.  Sometimes makes her catch her breath.  She remains on gabapentin 600 mg 3 times daily.  She is not currently taking any narcotics.  She is also on Valium for anxiety.  Past Medical History:  Diagnosis Date  . Adenocarcinoma of right lung, stage 1 ([place]) 07/28/2016  . Anxiety   . Cancer ([place])    uterine  . COPD (chronic obstructive pulmonary disease) ([place])   . Cyst    left side of neck  . Depression   . GERD (gastroesophageal reflux disease)   . Heart murmur    per patient  . Hyperlipidemia   . Hypertension   . Low back pain   . Lumbar back pain   . Osteoarthritis   . Osteoporosis     Current Outpatient Medications  Medication Sig Dispense Refill  . albuterol (PROVENTIL HFA;VENTOLIN HFA) 108 (90 BASE) MCG/ACT inhaler Inhale 2 puffs into the lungs every 6 (six) hours as needed. (Patient taking differently: Inhale 2 puffs into the lungs every 4 (four) hours as needed for wheezing or shortness of breath. ) 18 g 1  . albuterol (PROVENTIL) (2.5 MG/3ML) 0.083% nebulizer solution Take 3 mLs (2.5 mg total) by nebulization every 2 (two) hours as needed for wheezing or shortness of breath. 75 mL 12  . amLODipine (NORVASC) 2.5 MG tablet Take 5 mg by mouth  at bedtime.     Marland Kitchen atorvastatin (LIPITOR) 20 MG tablet Take 20 mg by mouth daily.     . diazepam (VALIUM) 5 MG tablet Take 5 mg by mouth at bedtime as needed for anxiety (for nerves).     . Fluticasone-Salmeterol (ADVAIR DISKUS) 250-50 MCG/DOSE AEPB Inhale 1 puff into the lungs every 12 (twelve) hours. 60 each 0  . gabapentin (NEURONTIN) 300 MG capsule Take 2 capsules (600 mg total) by mouth 3 (three) times daily. 180 capsule 0  . Glucosamine HCl (GLUCOSAMINE PO) Take 1 tablet by mouth daily.    Marland Kitchen lisinopril (PRINIVIL,ZESTRIL) 30 MG tablet Take 30 mg by mouth every morning.    . Magnesium 250 MG TABS Take 250 mg by mouth daily.    . metoCLOPramide (REGLAN) 5 MG tablet Take 5 mg by mouth 2 (two) times daily.     . pantoprazole (PROTONIX) 40 MG tablet Take 1 tablet (40 mg total) by mouth daily at 6 (six) AM. 30 tablet 0  . raloxifene (EVISTA) 60 MG tablet Take 60 mg by mouth daily.    . sertraline (ZOLOFT) 100 MG tablet Take 100 mg by mouth daily.    Marland Kitchen tiotropium (SPIRIVA)  18 MCG inhalation capsule Place 18 mcg into inhaler and inhale daily.       No current facility-administered medications for this visit.     Physical Exam BP 114/80   Pulse 84   Ht 5\' 2"  (1.575 m)   Wt 160 lb (72.6 kg)   SpO2 97%   BMI 29.53 kg/m  76 year old woman in no acute distress Alert and oriented x3 with no focal deficits Lungs diminished at right base, otherwise clear Incisions well-healed Tender to light touch right chest  Diagnostic Tests: CHEST  2 VIEW  COMPARISON:  11/01/2016  FINDINGS: Enlargement of cardiac silhouette.  Mediast pulmonary vascularity normal.  Surgical clips and postsurgical changes at RIGHT hilum/RIGHT upper lobe.  Scarring at lateral RIGHT base.  Volume loss in RIGHT hemithorax with slight mediastinal shift to the RIGHT.  Underlying emphysematous changes.  No pulmonary infiltrate, pleural effusion or pneumothorax.  Bones demineralized.  Surgical clips  RIGHT upper quadrant question cholecystectomy.  IMPRESSION: COPD changes with stable postsurgical changes in the RIGHT hemithorax and associated volume loss RIGHT chest.  No acute abnormalities.   Electronically Signed   By: Lavonia Dana M.D.   On: 05/30/2017 11:14 I personally reviewed the chest x-ray and concur with the findings noted above  Impression: [name] is a 76 year old woman who is now a year out from a thoracoscopic right upper lobectomy for stage IA non-small cell carcinoma.  She continues to have neuropathic pain which she considers to be severe.  She is very animated about the pain when it occurs.  She is able to function relatively normally despite the pain.  She is not currently on any narcotics.  She never followed up with the pain clinic after her previous injection.  Her daughter is familiar with Dr. Dossie Arbour in [place] and the patient is requesting a referral to him.  Plan: Refer to Dr. Milbert Coulter [facility] pain [facility]  Follow-up with Dr. Julien Nordmann as scheduled after the first of the year.  Return in 1 year  [name], MD [facility] [phone]

## 2017-06-05 ENCOUNTER — Other Ambulatory Visit: Payer: Self-pay | Admitting: Internal Medicine

## 2017-06-05 ENCOUNTER — Ambulatory Visit
Admission: RE | Admit: 2017-06-05 | Discharge: 2017-06-05 | Disposition: A | Discharge status: Home / Self Care | Payer: Medicare Other | Source: Ambulatory Visit | Attending: Internal Medicine | Admitting: Internal Medicine

## 2017-06-05 DIAGNOSIS — J441 Chronic obstructive pulmonary disease with (acute) exacerbation: Secondary | ICD-10-CM

## 2017-06-05 IMAGING — CR DG CHEST 2V
2 series · 2 of 2 positions shown · non-contrast
Comparison: [DATE], [DATE] and [DATE] chest x-ray.
[DATE] chest CT.

CLINICAL DATA: 76-year-old female with COPD. Post right upper
lobectomy for lung cancer [DATE]. Increasing shortness of
breath with cough and fever. Subsequent encounter.

EXAM:
CHEST  2 VIEW

[w chest pa]
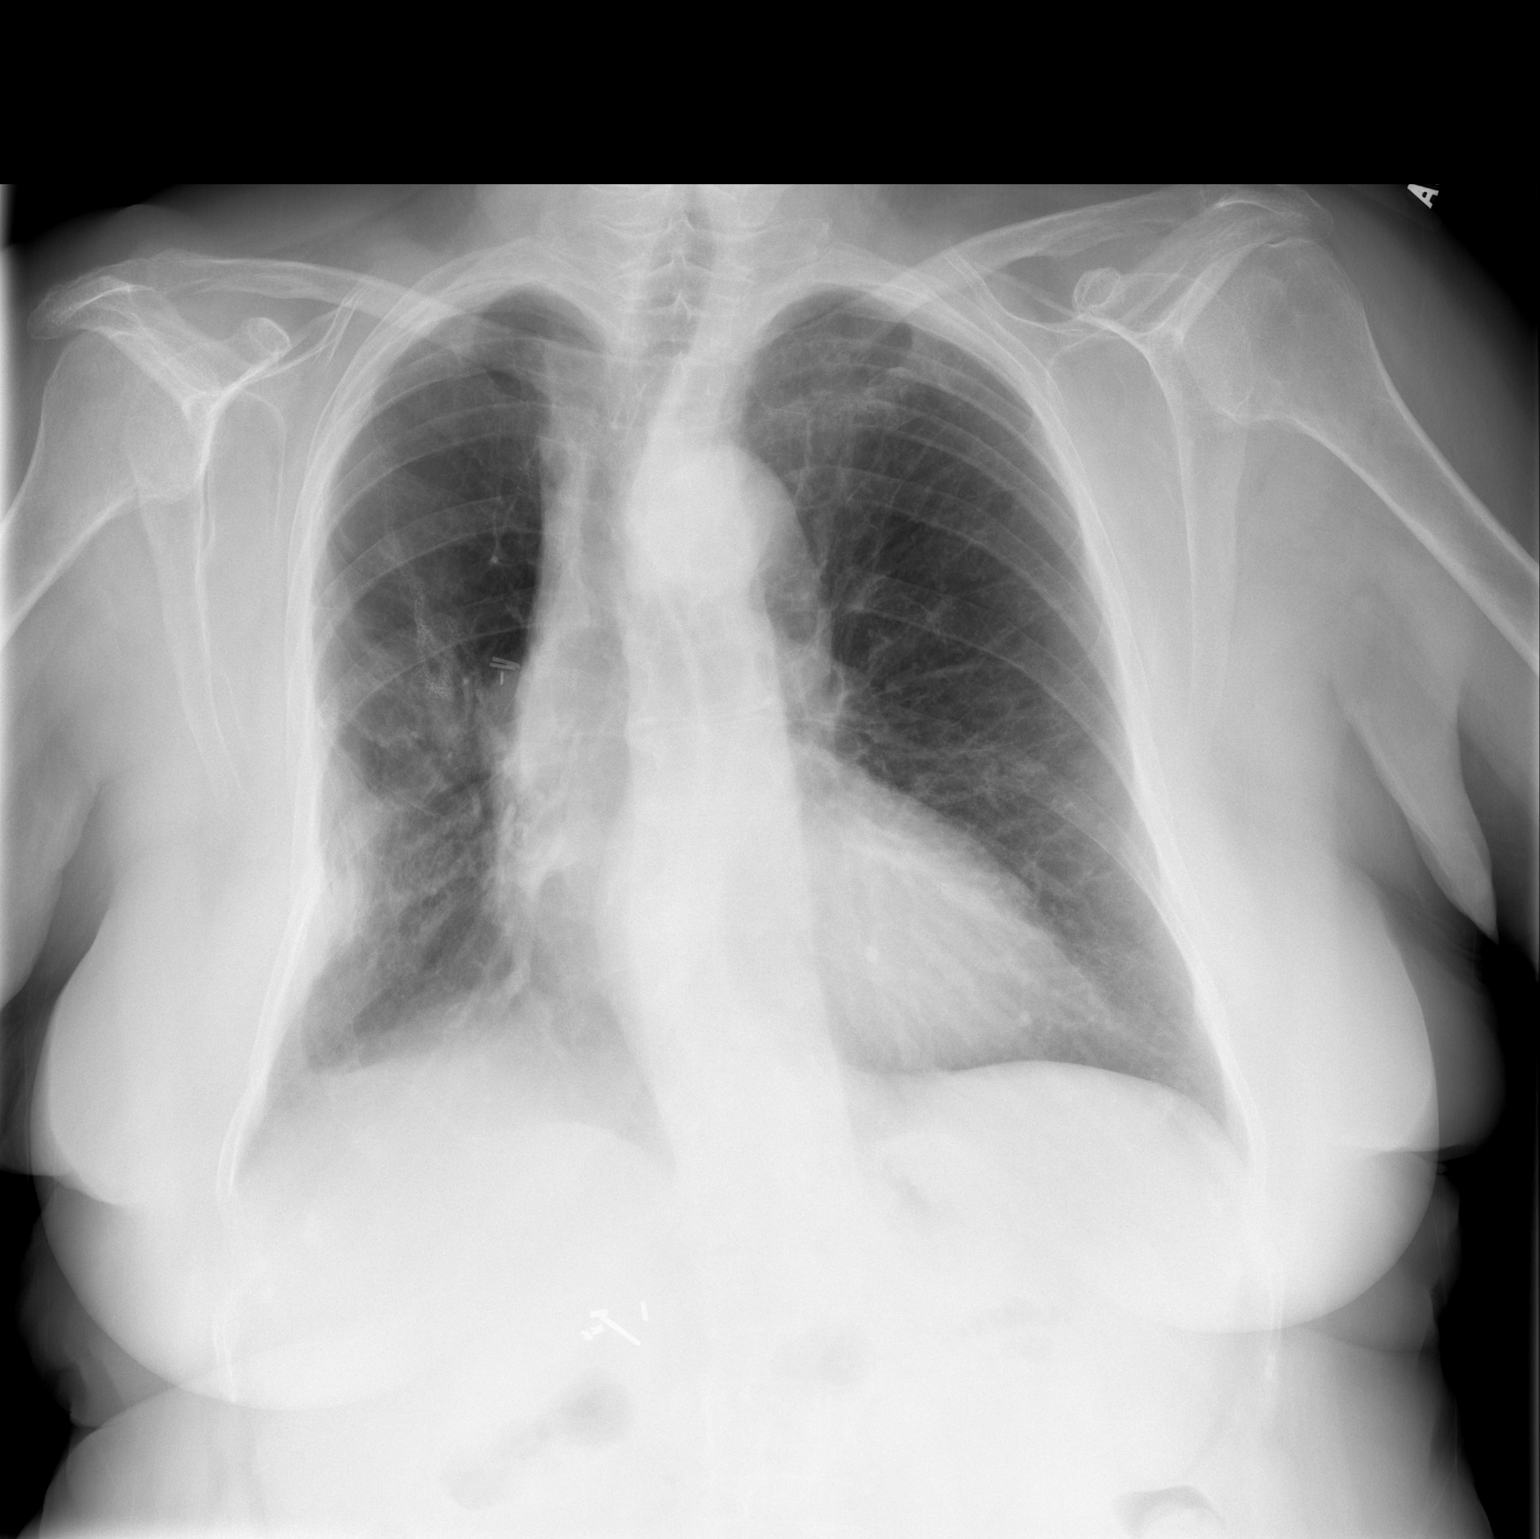

[w chest lat]
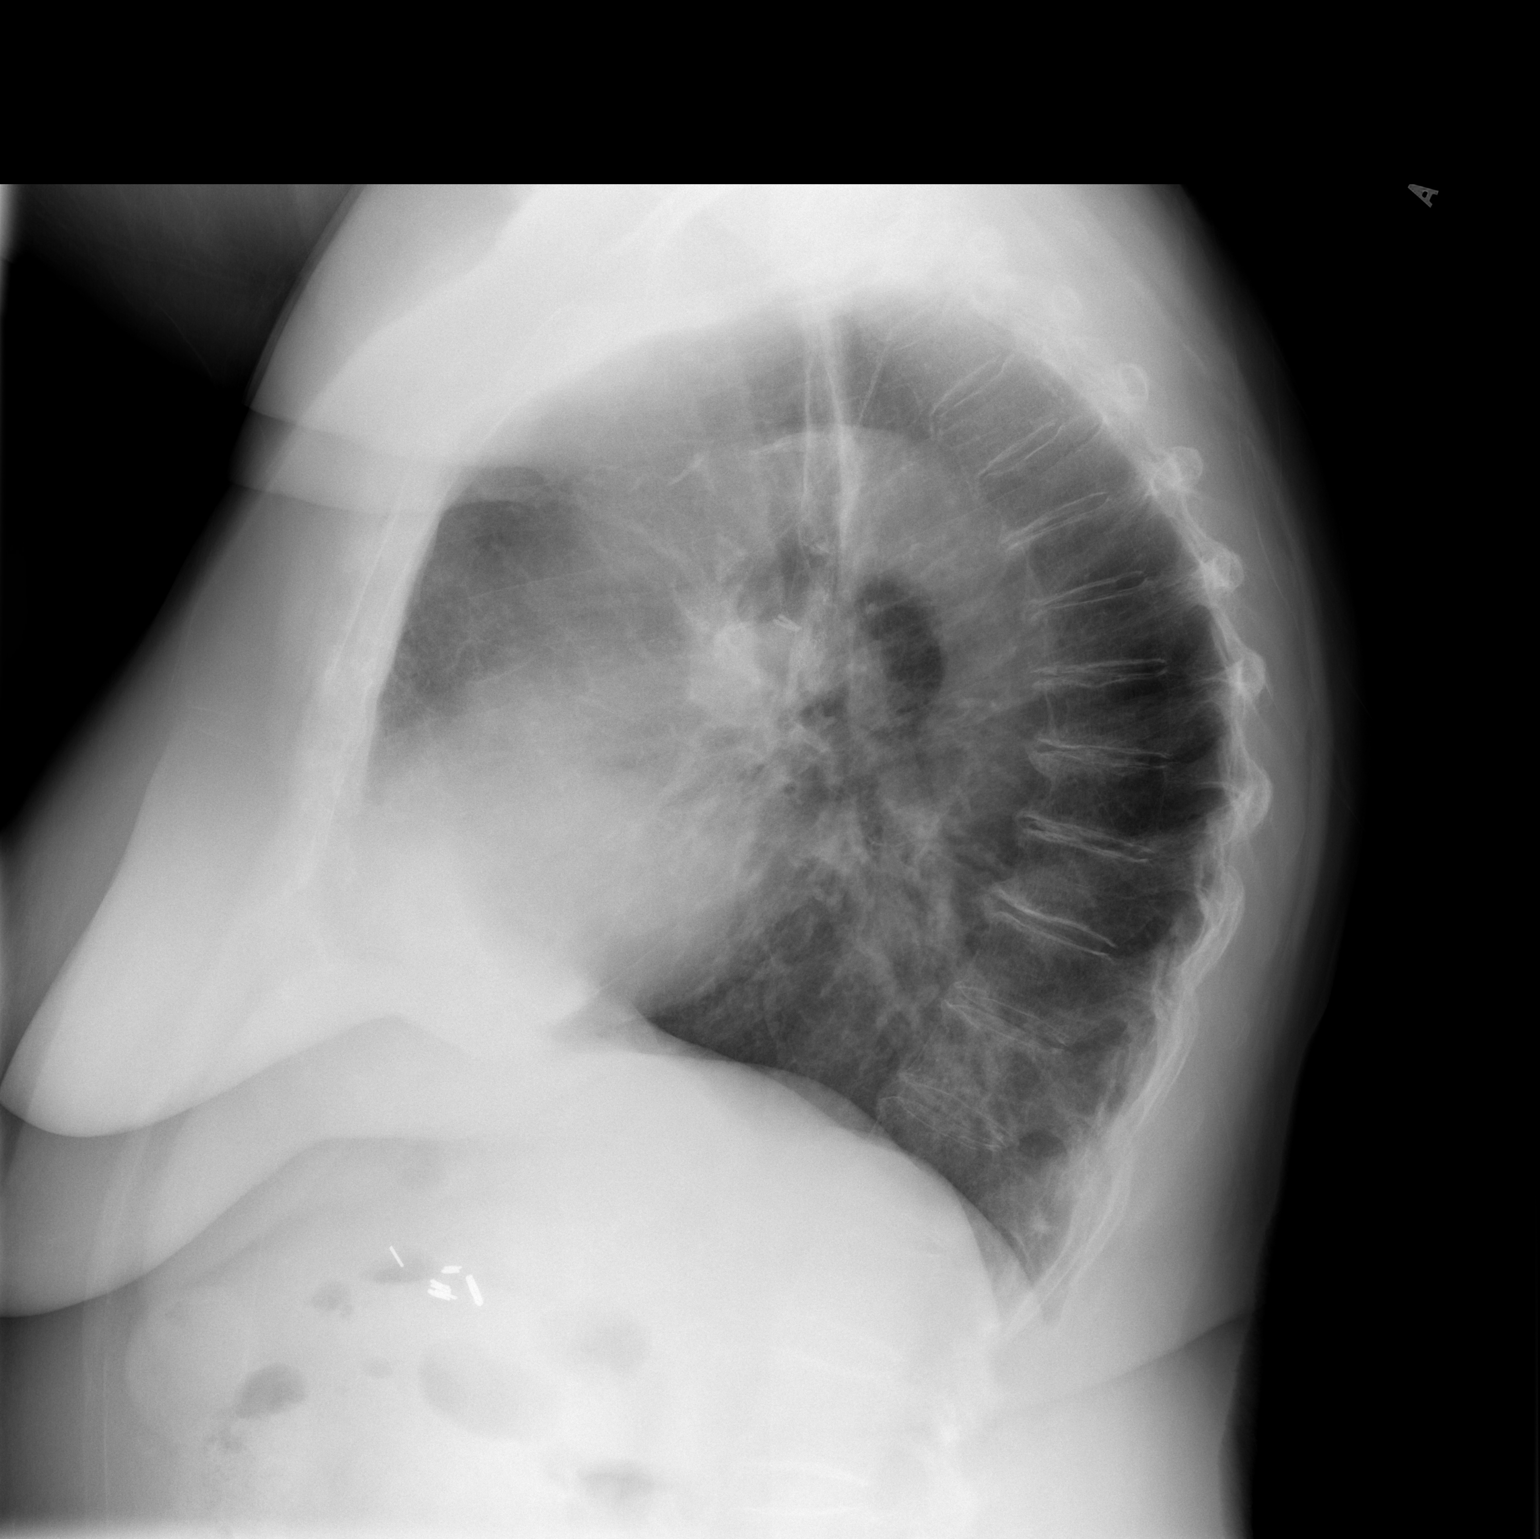

[2 of 2 positions shown; findings below may reference images not displayed]

FINDINGS: Post right upper lobectomy. When compared to [DATE] examination,
interval progression of pleural thickening along the lower right
chest wall. Although it is possible this represents postoperative
scarring, recurrent tumor not excluded. Chest CT imaging would be
necessary for further delineation if clinically desired.

Separate from the above findings, no infiltrate, congestive heart
failure or pneumothorax.

Heart slightly enlarged.

Calcified slightly tortuous aorta.

Mild thoracic kyphosis without focal compression fracture.
IMPRESSION: Post right upper lobectomy. When compared to [DATE] examination,
interval progression of pleural thickening along the lower right
chest wall. Although it is possible this represents postoperative
scarring, recurrent tumor not excluded. Chest CT imaging would be
necessary for further delineation if clinically desired.

## 2017-06-19 ENCOUNTER — Ambulatory Visit: Payer: Medicare Other | Admitting: Nurse Practitioner

## 2017-06-21 ENCOUNTER — Ambulatory Visit
Admission: RE | Admit: 2017-06-21 | Discharge: 2017-06-21 | Disposition: A | Discharge status: Home / Self Care | Payer: Medicare Other | Source: Ambulatory Visit | Attending: Nurse Practitioner | Admitting: Nurse Practitioner

## 2017-06-21 ENCOUNTER — Ambulatory Visit: Payer: Medicare Other | Attending: Nurse Practitioner | Admitting: Nurse Practitioner

## 2017-06-21 ENCOUNTER — Encounter: Payer: Self-pay | Admitting: Nurse Practitioner

## 2017-06-21 ENCOUNTER — Other Ambulatory Visit
Admission: RE | Admit: 2017-06-21 | Discharge: 2017-06-21 | Disposition: A | Discharge status: Home / Self Care | Payer: Medicare Other | Source: Ambulatory Visit | Attending: Nurse Practitioner | Admitting: Nurse Practitioner

## 2017-06-21 VITALS — BP 153/85 | HR 64 | Temp 98.3°F | Resp 18 | Ht 61.5 in | Wt 163.0 lb

## 2017-06-21 DIAGNOSIS — M25551 Pain in right hip: Secondary | ICD-10-CM

## 2017-06-21 DIAGNOSIS — Z85118 Personal history of other malignant neoplasm of bronchus and lung: Secondary | ICD-10-CM | POA: Insufficient documentation

## 2017-06-21 DIAGNOSIS — Z90710 Acquired absence of both cervix and uterus: Secondary | ICD-10-CM | POA: Insufficient documentation

## 2017-06-21 DIAGNOSIS — R0789 Other chest pain: Secondary | ICD-10-CM

## 2017-06-21 DIAGNOSIS — G8929 Other chronic pain: Secondary | ICD-10-CM | POA: Insufficient documentation

## 2017-06-21 DIAGNOSIS — M5442 Lumbago with sciatica, left side: Principal | ICD-10-CM

## 2017-06-21 DIAGNOSIS — S32591A Other specified fracture of right pubis, initial encounter for closed fracture: Secondary | ICD-10-CM | POA: Insufficient documentation

## 2017-06-21 DIAGNOSIS — M25552 Pain in left hip: Secondary | ICD-10-CM | POA: Diagnosis not present

## 2017-06-21 DIAGNOSIS — N183 Chronic kidney disease, stage 3 (moderate): Secondary | ICD-10-CM | POA: Diagnosis not present

## 2017-06-21 DIAGNOSIS — G894 Chronic pain syndrome: Secondary | ICD-10-CM | POA: Insufficient documentation

## 2017-06-21 DIAGNOSIS — M79605 Pain in left leg: Secondary | ICD-10-CM

## 2017-06-21 DIAGNOSIS — M5441 Lumbago with sciatica, right side: Secondary | ICD-10-CM | POA: Insufficient documentation

## 2017-06-21 DIAGNOSIS — M5137 Other intervertebral disc degeneration, lumbosacral region: Secondary | ICD-10-CM | POA: Insufficient documentation

## 2017-06-21 DIAGNOSIS — I129 Hypertensive chronic kidney disease with stage 1 through stage 4 chronic kidney disease, or unspecified chronic kidney disease: Secondary | ICD-10-CM | POA: Diagnosis not present

## 2017-06-21 DIAGNOSIS — M533 Sacrococcygeal disorders, not elsewhere classified: Secondary | ICD-10-CM | POA: Insufficient documentation

## 2017-06-21 DIAGNOSIS — Z9889 Other specified postprocedural states: Secondary | ICD-10-CM | POA: Diagnosis not present

## 2017-06-21 DIAGNOSIS — E785 Hyperlipidemia, unspecified: Secondary | ICD-10-CM | POA: Insufficient documentation

## 2017-06-21 DIAGNOSIS — M8588 Other specified disorders of bone density and structure, other site: Secondary | ICD-10-CM | POA: Insufficient documentation

## 2017-06-21 DIAGNOSIS — X58XXXA Exposure to other specified factors, initial encounter: Secondary | ICD-10-CM | POA: Insufficient documentation

## 2017-06-21 DIAGNOSIS — Z789 Other specified health status: Secondary | ICD-10-CM

## 2017-06-21 DIAGNOSIS — Z9049 Acquired absence of other specified parts of digestive tract: Secondary | ICD-10-CM | POA: Diagnosis not present

## 2017-06-21 DIAGNOSIS — Z87891 Personal history of nicotine dependence: Secondary | ICD-10-CM | POA: Insufficient documentation

## 2017-06-21 DIAGNOSIS — M4802 Spinal stenosis, cervical region: Secondary | ICD-10-CM | POA: Insufficient documentation

## 2017-06-21 DIAGNOSIS — M549 Dorsalgia, unspecified: Secondary | ICD-10-CM | POA: Insufficient documentation

## 2017-06-21 DIAGNOSIS — Z79899 Other long term (current) drug therapy: Secondary | ICD-10-CM | POA: Insufficient documentation

## 2017-06-21 DIAGNOSIS — M79604 Pain in right leg: Secondary | ICD-10-CM

## 2017-06-21 DIAGNOSIS — M899 Disorder of bone, unspecified: Secondary | ICD-10-CM | POA: Diagnosis not present

## 2017-06-21 DIAGNOSIS — C3491 Malignant neoplasm of unspecified part of right bronchus or lung: Secondary | ICD-10-CM

## 2017-06-21 DIAGNOSIS — J441 Chronic obstructive pulmonary disease with (acute) exacerbation: Secondary | ICD-10-CM | POA: Insufficient documentation

## 2017-06-21 LAB — COMPREHENSIVE METABOLIC PANEL
ALT: 13 U/L — ABNORMAL LOW (ref 14–54)
AST: 18 U/L (ref 15–41)
Albumin: 3.9 g/dL (ref 3.5–5.0)
Alkaline Phosphatase: 55 U/L (ref 38–126)
Anion gap: 12 (ref 5–15)
BUN: 26 mg/dL — ABNORMAL HIGH (ref 6–20)
CO2: 27 mmol/L (ref 22–32)
Calcium: 8.7 mg/dL — ABNORMAL LOW (ref 8.9–10.3)
Chloride: 101 mmol/L (ref 101–111)
Creatinine, Ser: 1.23 mg/dL — ABNORMAL HIGH (ref 0.44–1.00)
GFR calc Af Amer: 49 mL/min — ABNORMAL LOW (ref >60)
GFR calc non Af Amer: 42 mL/min — ABNORMAL LOW (ref >60)
Glucose, Bld: 74 mg/dL (ref 65–99)
Potassium: 3.4 mmol/L — ABNORMAL LOW (ref 3.5–5.1)
Sodium: 140 mmol/L (ref 135–145)
Total Bilirubin: 0.4 mg/dL (ref 0.3–1.2)
Total Protein: 7.5 g/dL (ref 6.5–8.1)

## 2017-06-21 LAB — CBC WITH DIFFERENTIAL/PLATELET
Basophils Absolute: 0.1 10*3/uL (ref 0–0.1)
Basophils Relative: 1 %
Eosinophils Absolute: 0.2 10*3/uL (ref 0–0.7)
Eosinophils Relative: 2 %
HCT: 42.1 % (ref 35.0–47.0)
Hemoglobin: 14 g/dL (ref 12.0–16.0)
Lymphocytes Relative: 26 %
Lymphs Abs: 3.4 10*3/uL (ref 1.0–3.6)
MCH: 29.9 pg (ref 26.0–34.0)
MCHC: 33.2 g/dL (ref 32.0–36.0)
MCV: 90 fL (ref 80.0–100.0)
Monocytes Absolute: 0.9 10*3/uL (ref 0.2–0.9)
Monocytes Relative: 7 %
Neutro Abs: 8.6 10*3/uL — ABNORMAL HIGH (ref 1.4–6.5)
Neutrophils Relative %: 64 %
Platelets: 297 10*3/uL (ref 150–440)
RBC: 4.68 MIL/uL (ref 3.80–5.20)
RDW: 13.7 % (ref 11.5–14.5)
WBC: 13.2 10*3/uL — ABNORMAL HIGH (ref 3.6–11.0)

## 2017-06-21 IMAGING — CR DG HIP (WITH OR WITHOUT PELVIS) 2-3V*R*
1 series · 3 of 3 positions shown · non-contrast
Comparison: None.

CLINICAL DATA: Chronic bilateral hip pain

EXAM:
DG HIP (WITH OR WITHOUT PELVIS) 2-3V RIGHT

[Series 1: dg hip unilat w or w/o pelvis 2-3 views  · non-contrast · 0.14mm/px · 3 of 3 slices shown]
[im 1/3]
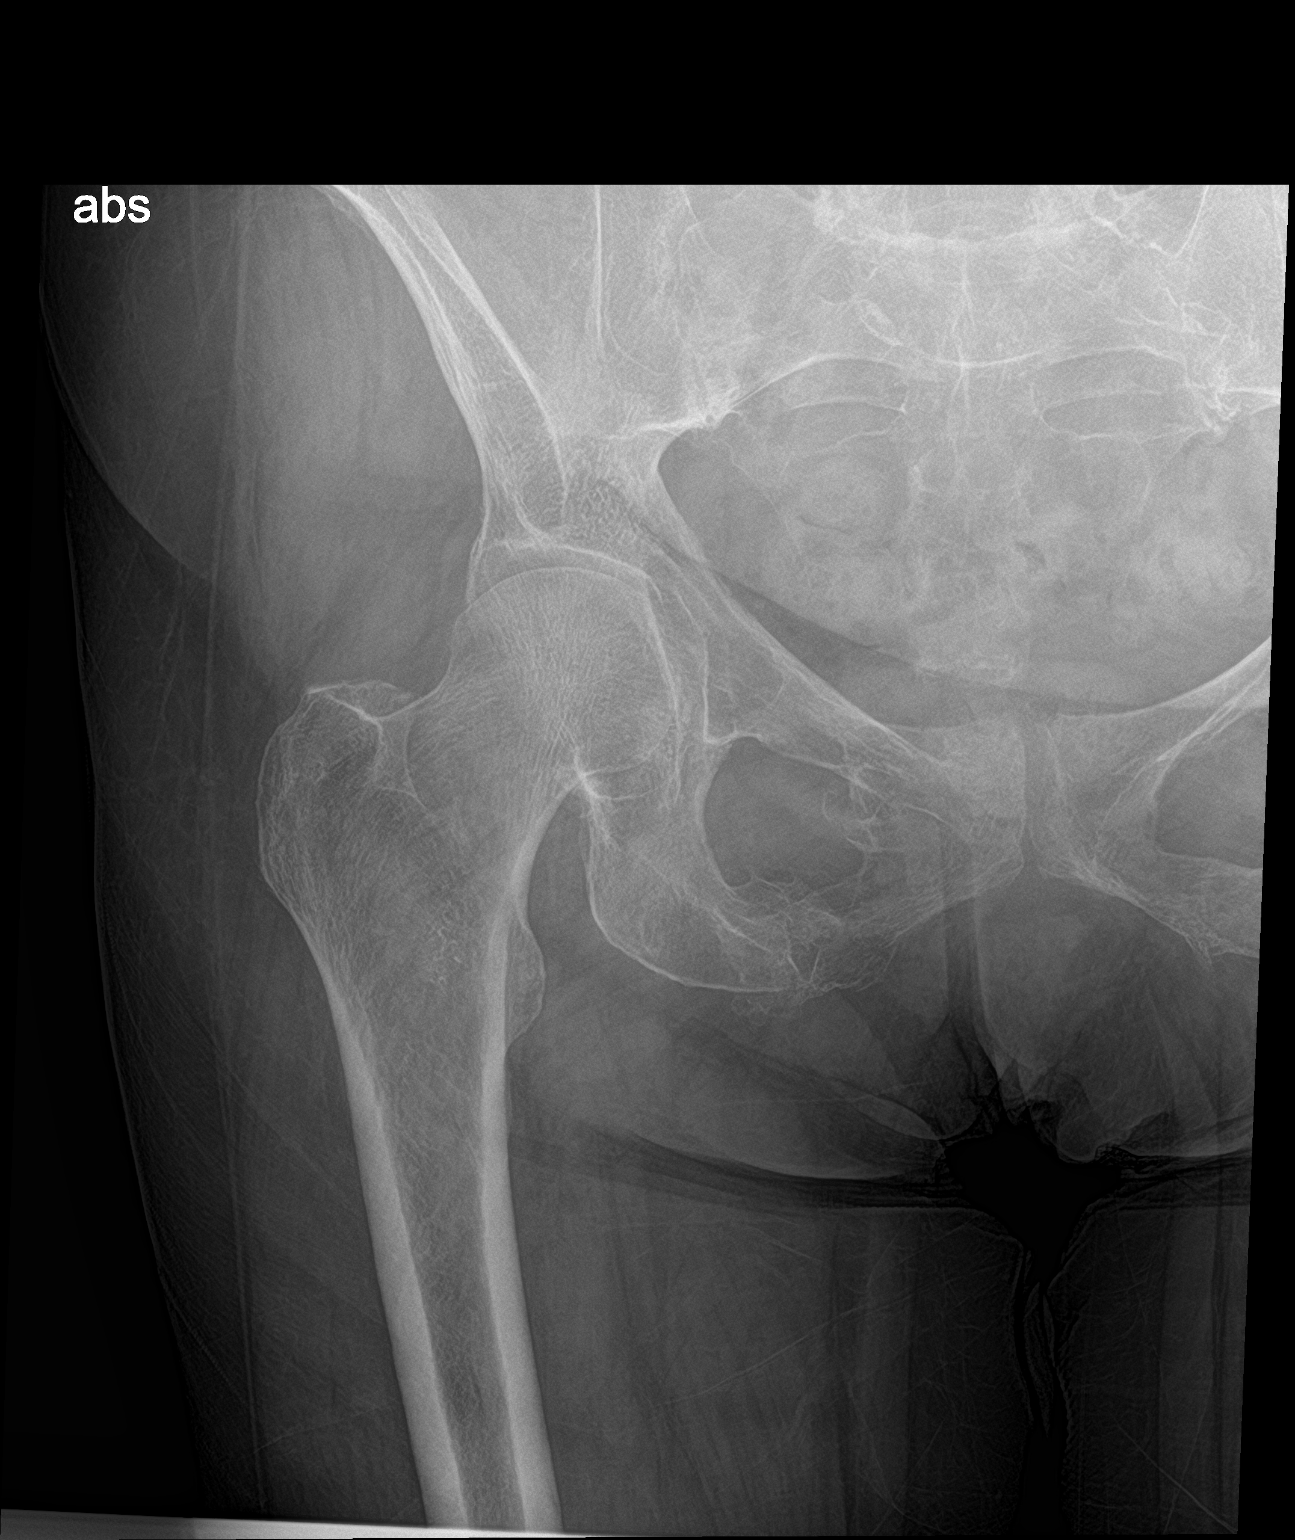
[im 2/3]
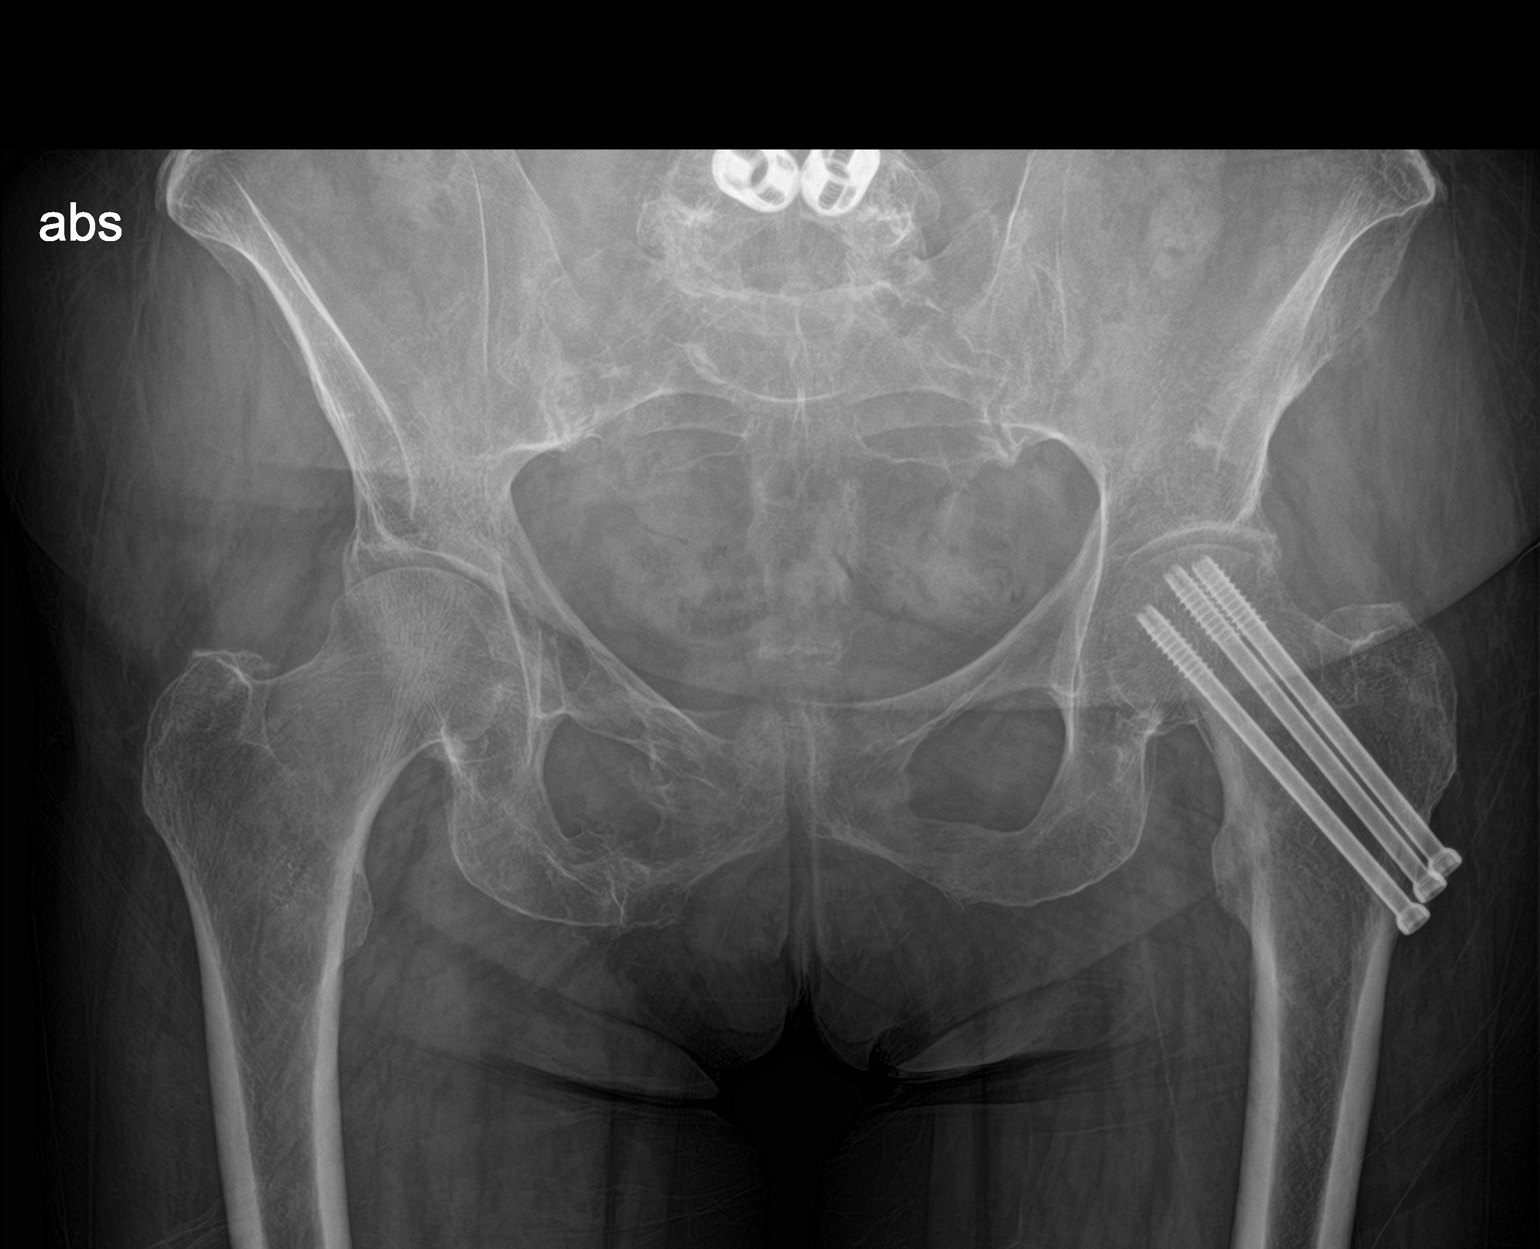
[im 3/3]
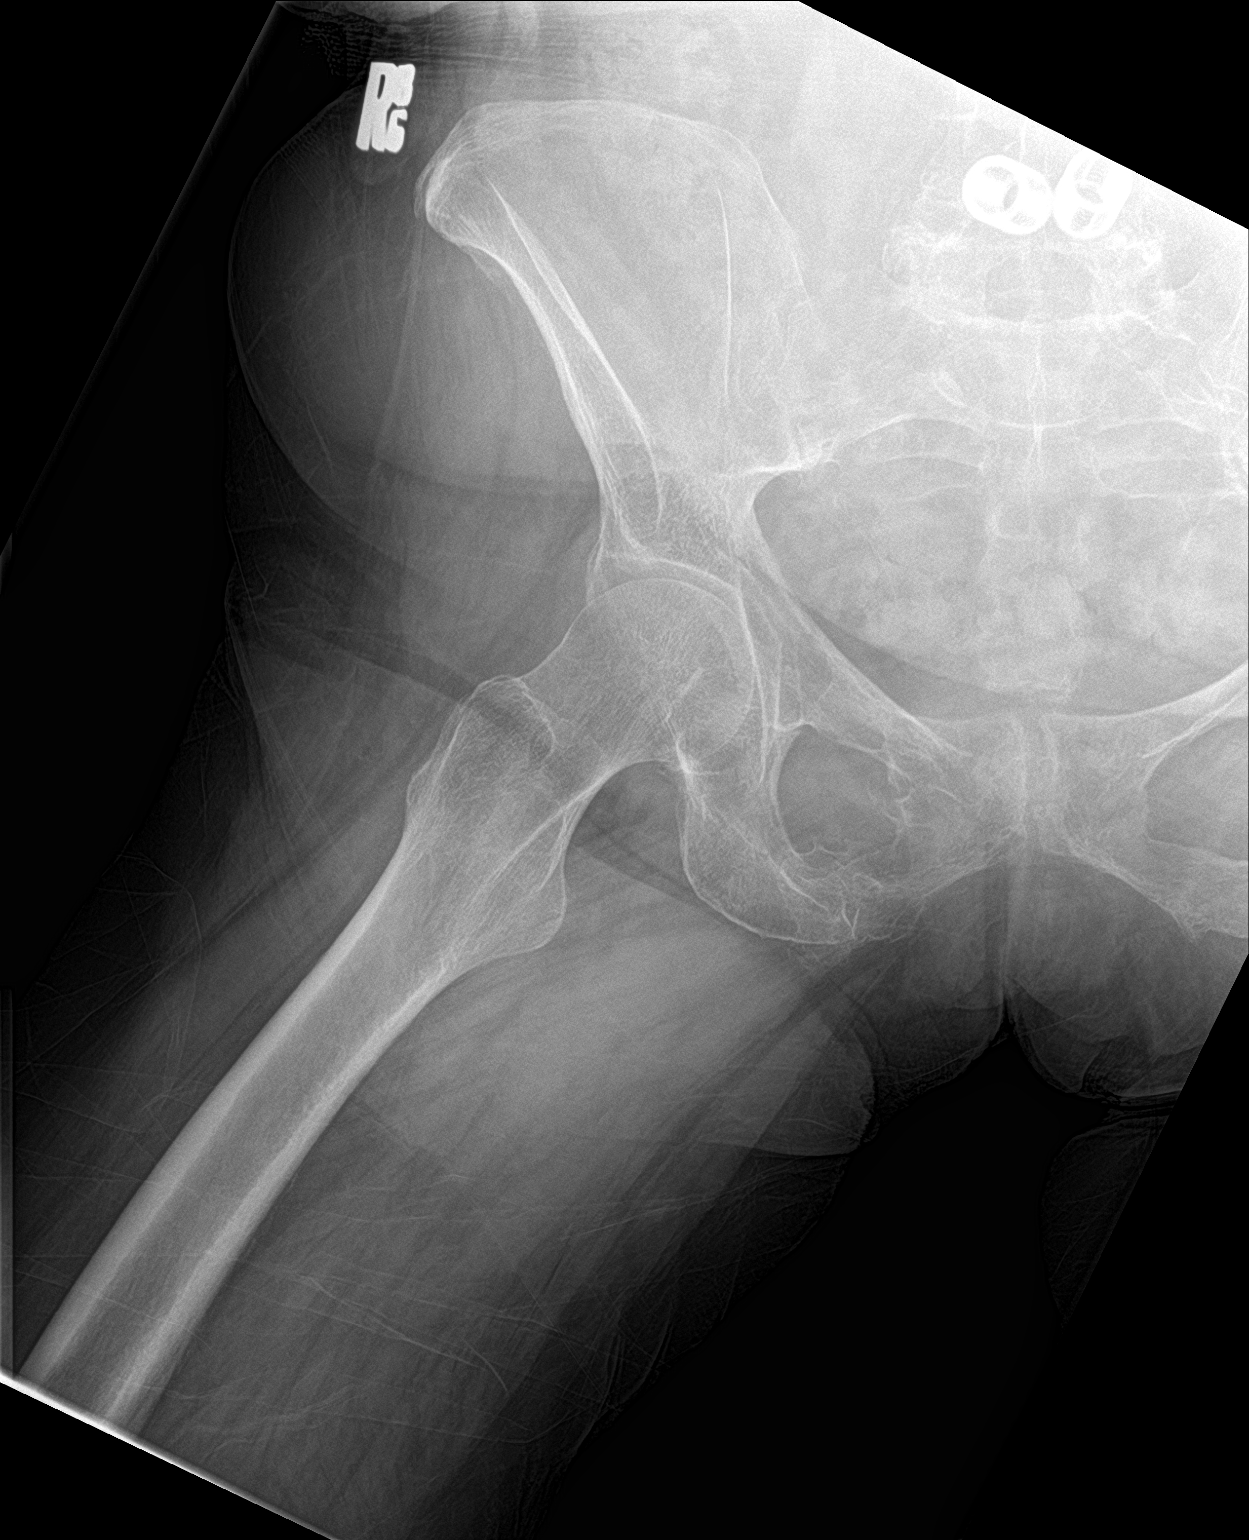

[3 of 3 positions shown; findings below may reference images not displayed]

FINDINGS: Chronic fracture right superior inferior pubic rami. No acute
fracture

Both hip joints normal. Prior fracture fixation left femoral neck
fracture with 3 pins. Interbody fusion L4-5
IMPRESSION: Chronic fracture right pubic bone.  No acute abnormality.

## 2017-06-21 IMAGING — CR DG HIP (WITH OR WITHOUT PELVIS) 2-3V*L*
1 series · 3 of 3 positions shown · non-contrast
Comparison: None.

CLINICAL DATA: Chronic bilateral hip pain

EXAM:
DG HIP (WITH OR WITHOUT PELVIS) 2-3V LEFT

[Series 1: dg hip unilat w or w/o pelvis 2-3 views  · non-contrast · 0.14mm/px · 3 of 3 slices shown]
[im 1/3]
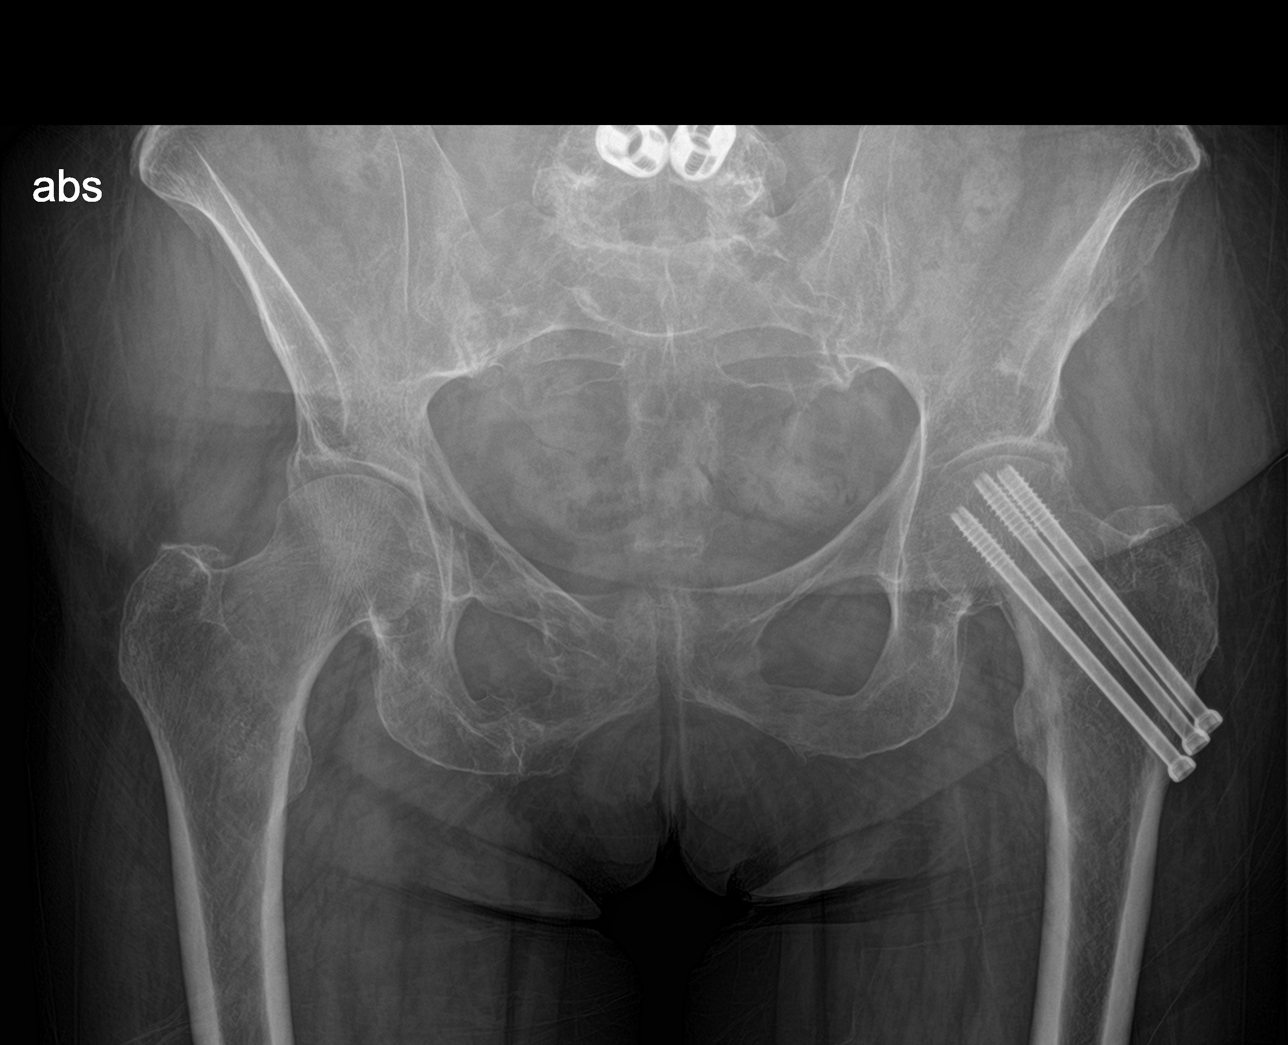
[im 2/3]
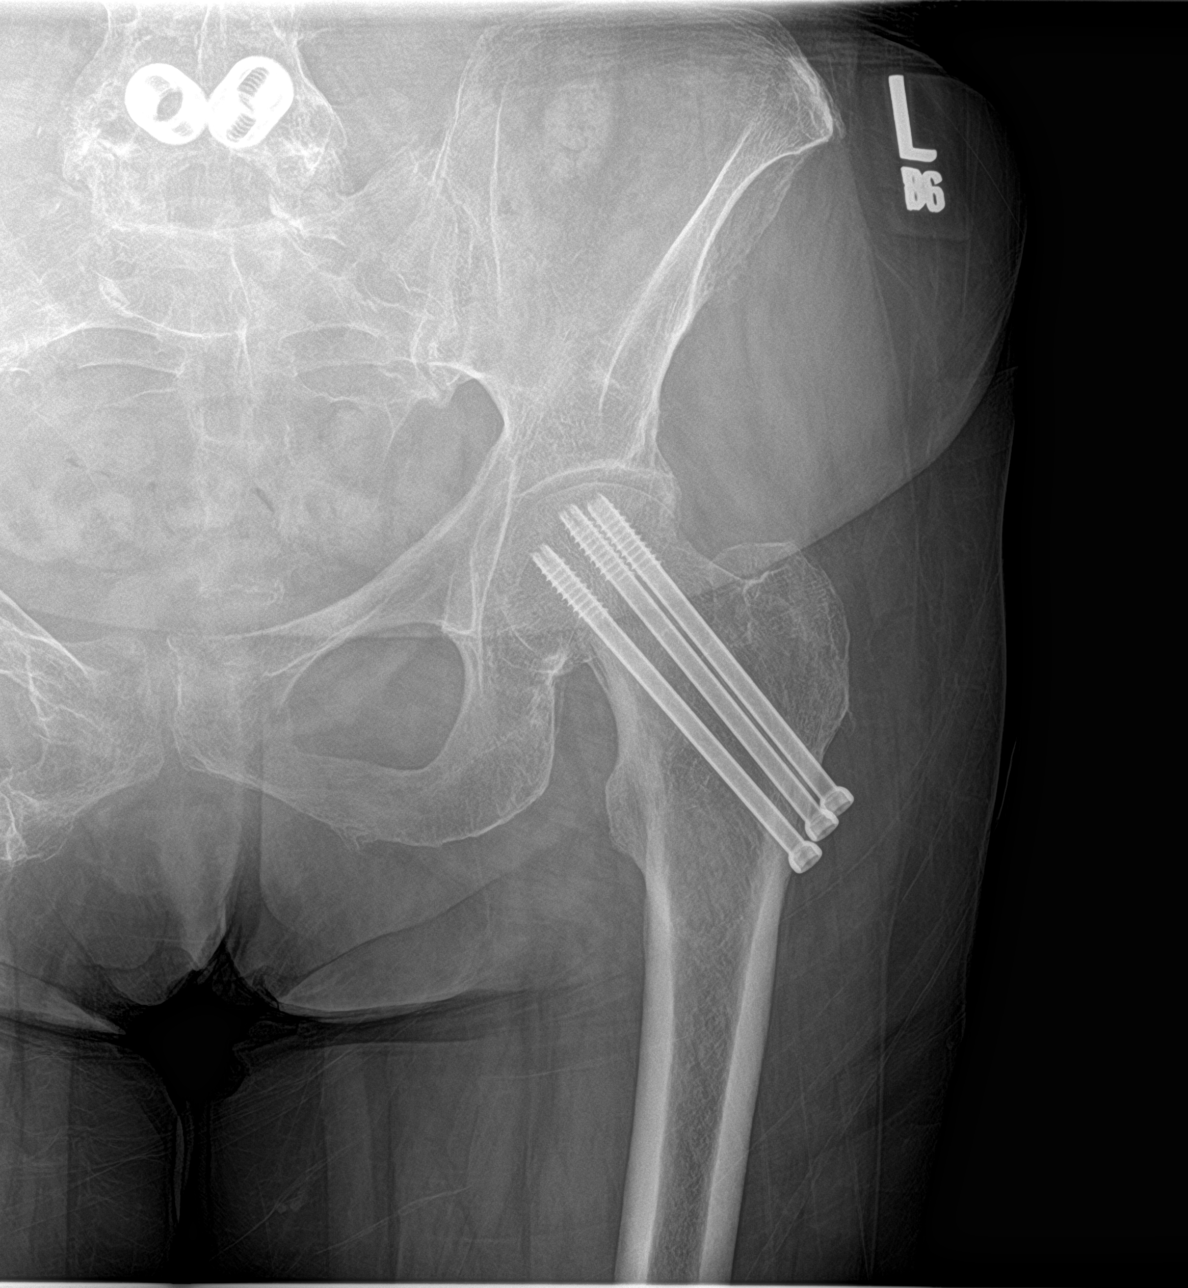
[im 3/3]
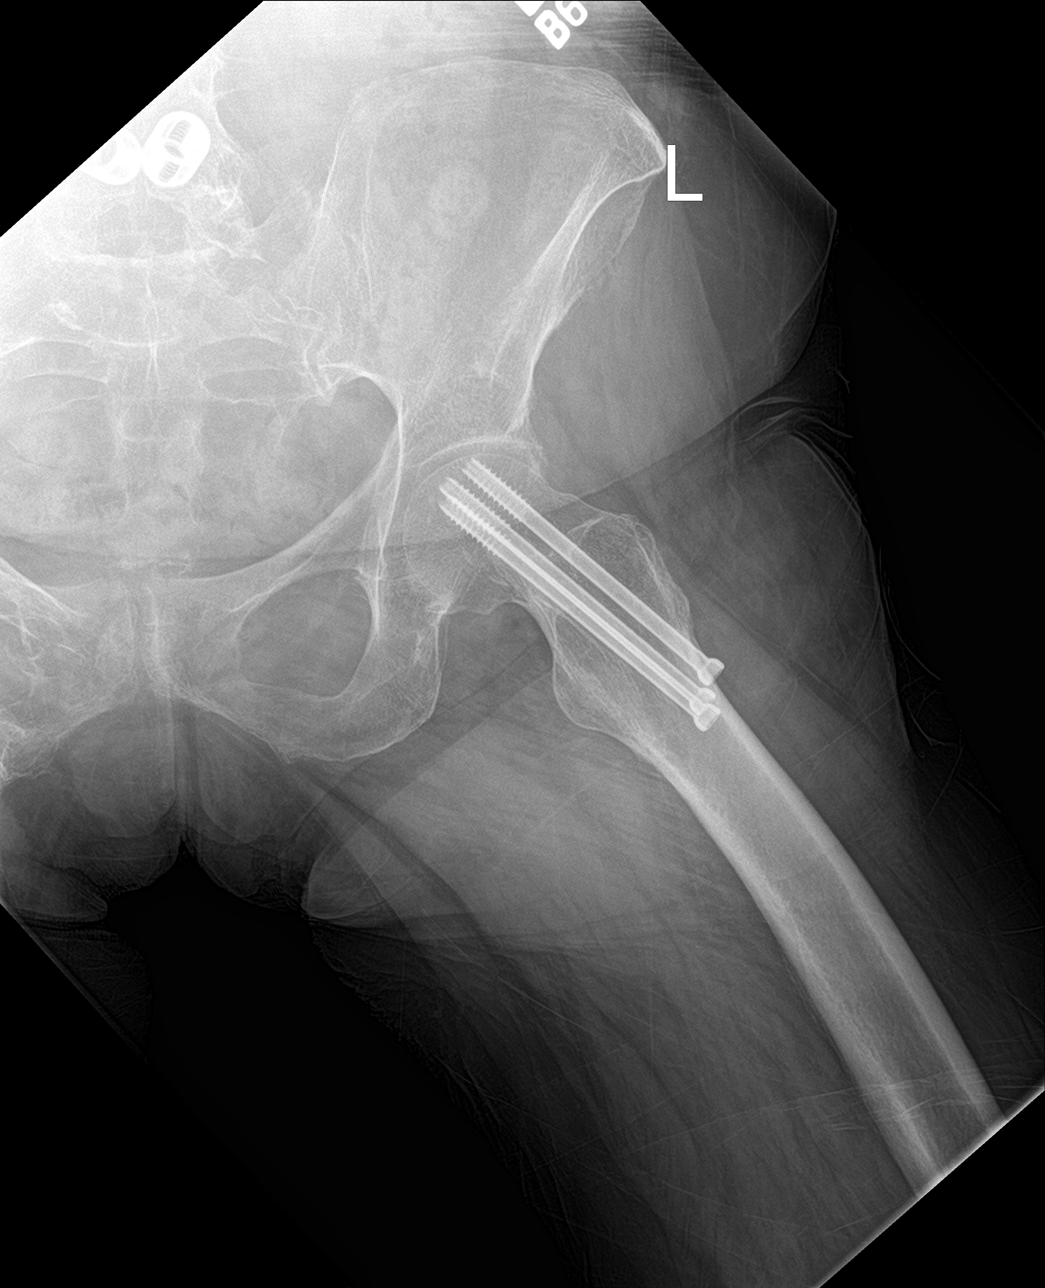

[3 of 3 positions shown; findings below may reference images not displayed]

FINDINGS: Healed fracture left hip which has been fixed with 3 threaded screws
in good position. Left hip joint normal

Right hip joint normal

Chronic healed fracture right inferior and superior pubic rami.
Interbody fusion L4-5. Negative for acute fracture
IMPRESSION: Chronic healed fracture left femoral neck

Normal right hip

Chronic fracture right pubic bone

## 2017-06-21 IMAGING — CR DG SI JOINTS 3+V
1 series · 4 of 4 positions shown · non-contrast
Comparison: [DATE]

CLINICAL DATA: Chronic sacroiliac pain

EXAM:
BILATERAL SACROILIAC JOINTS - 3+ VIEW

[Series 1: dg si joints · 0.14mm/px · 4 of 4 slices shown]
[im 1/4]
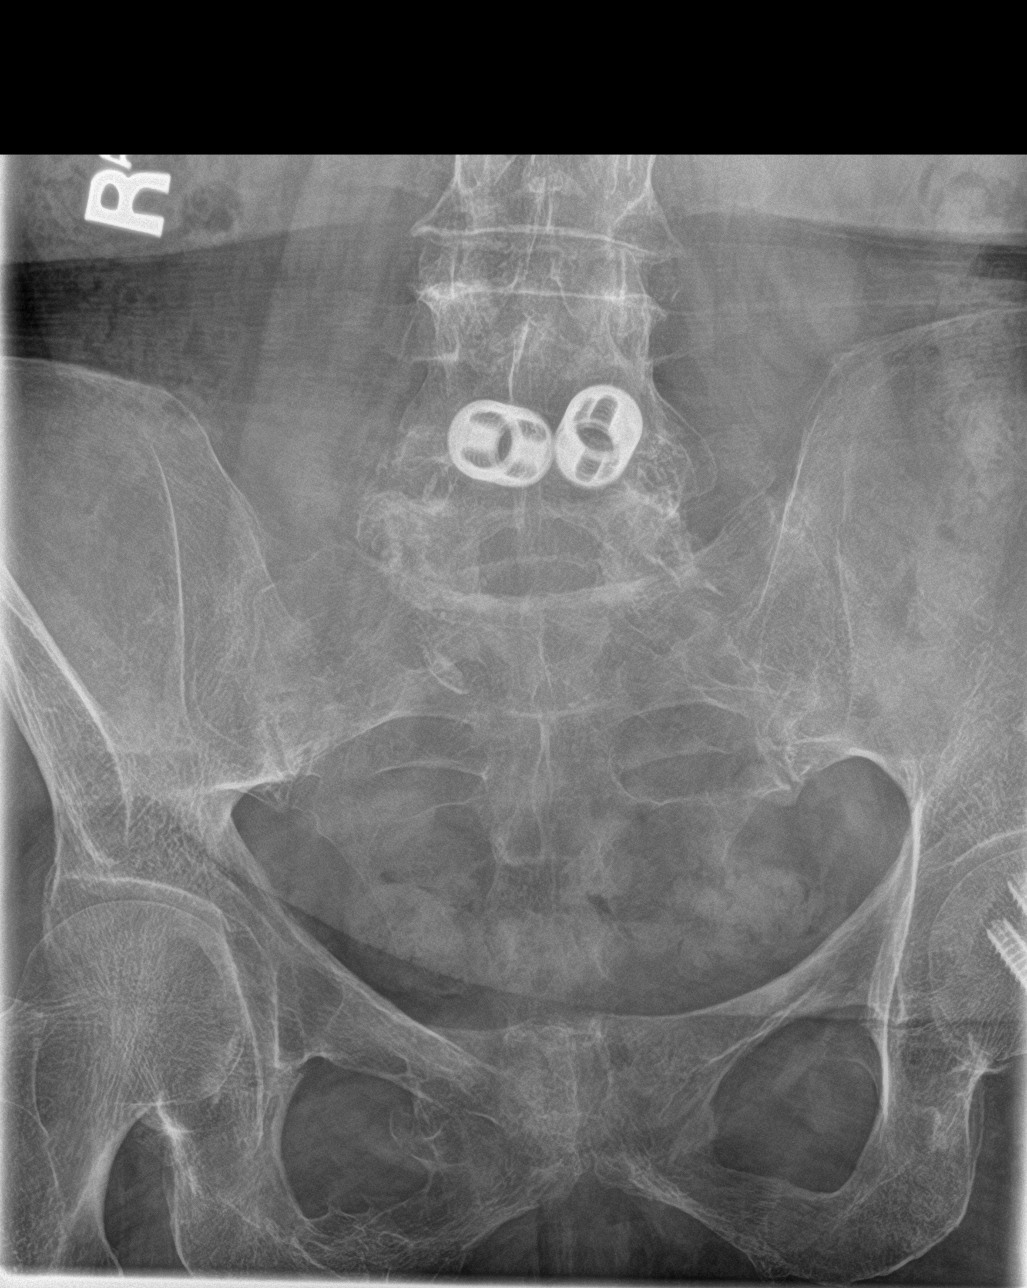
[im 2/4]
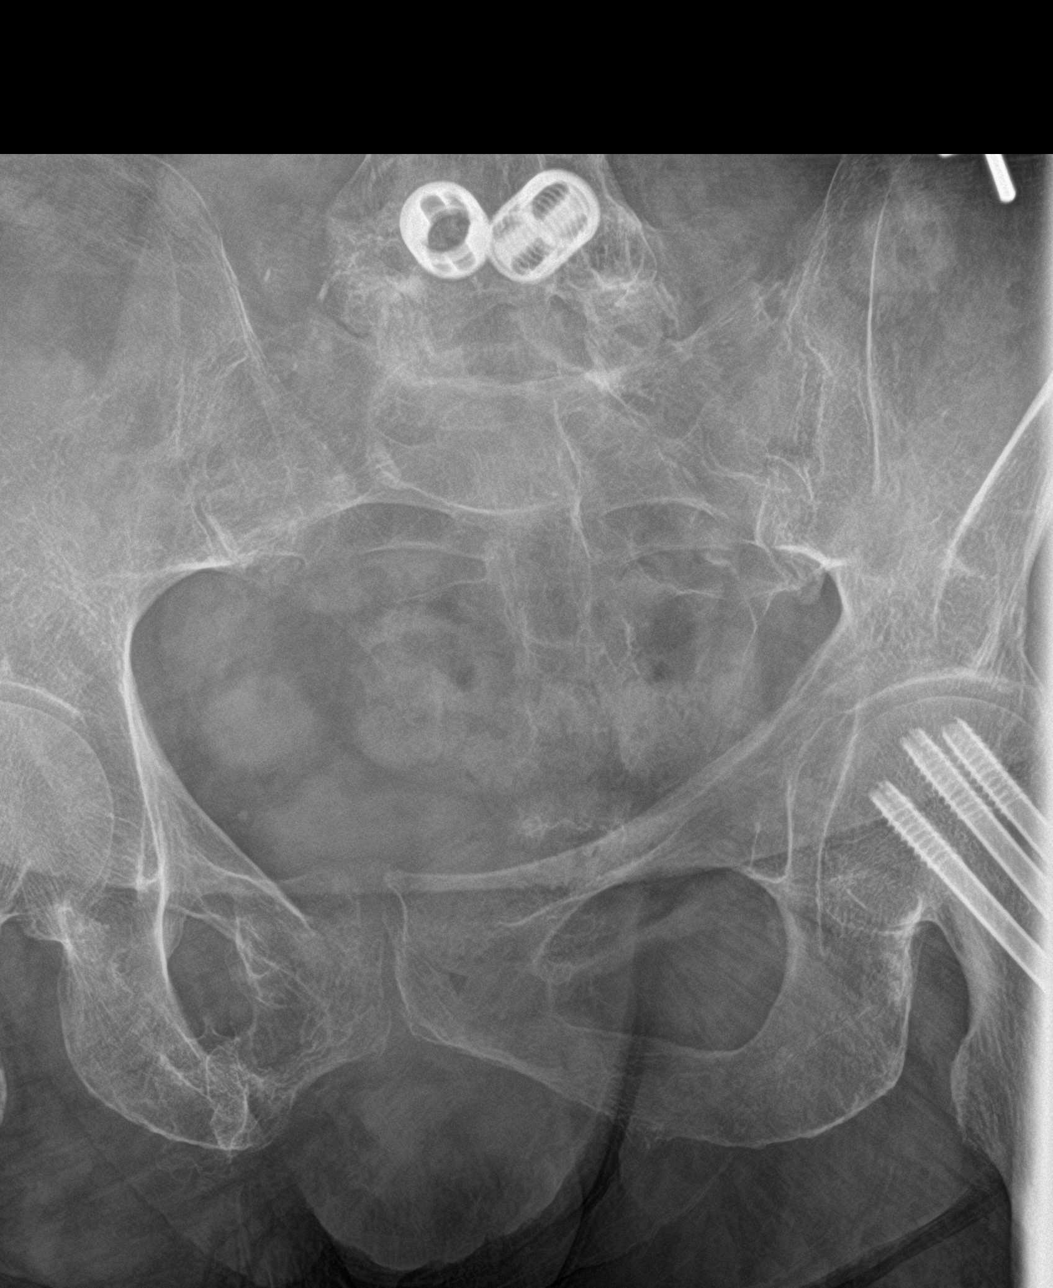
[im 3/4]
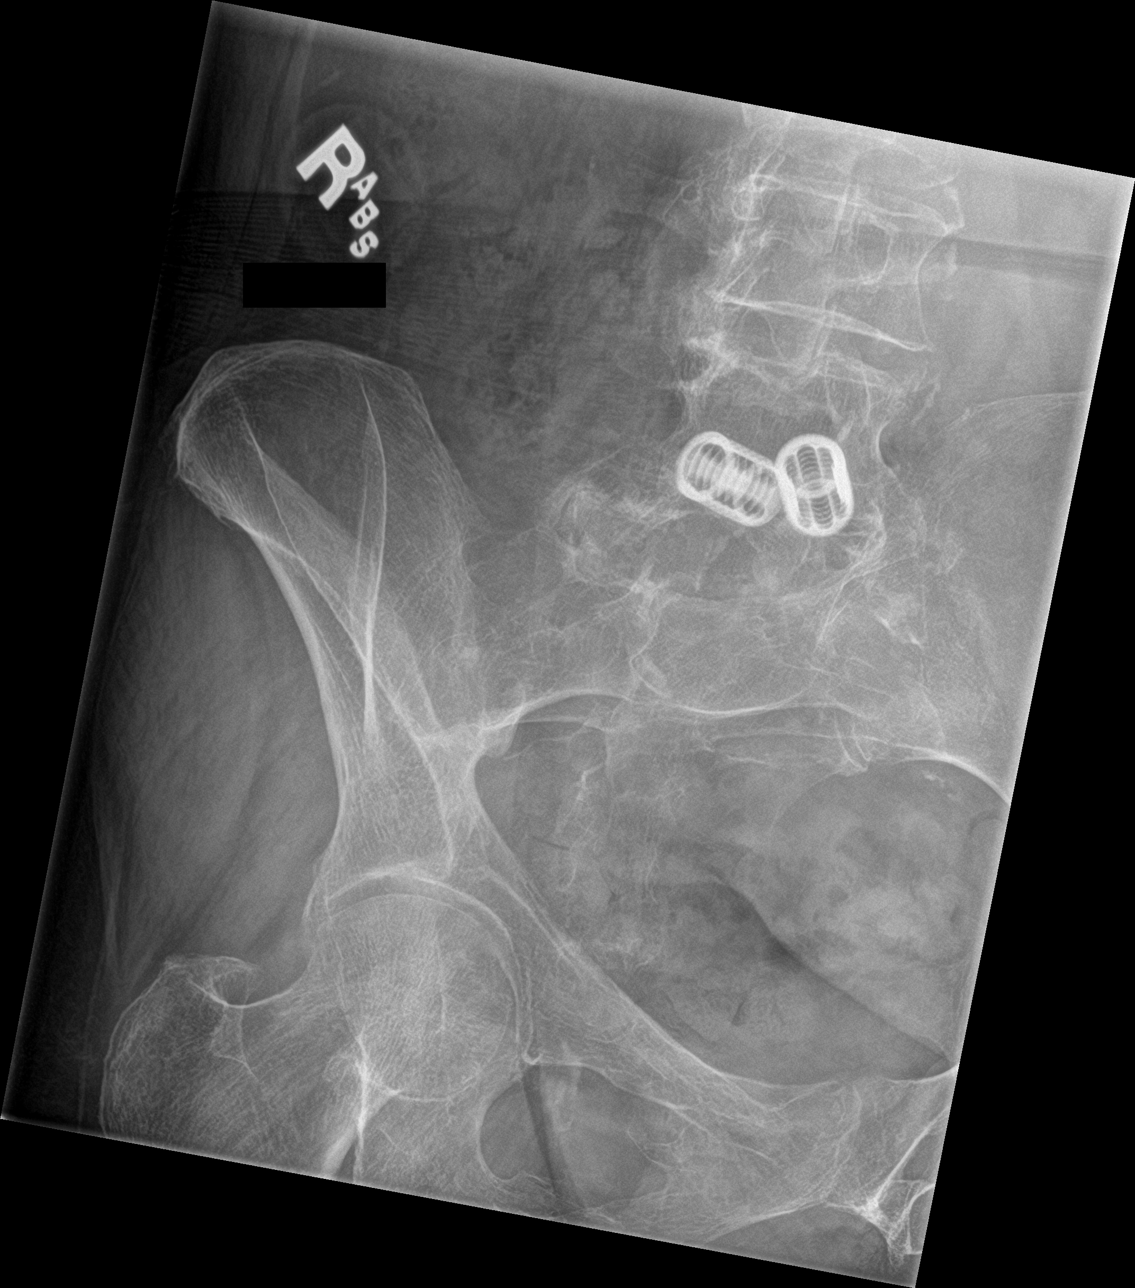
[im 4/4]
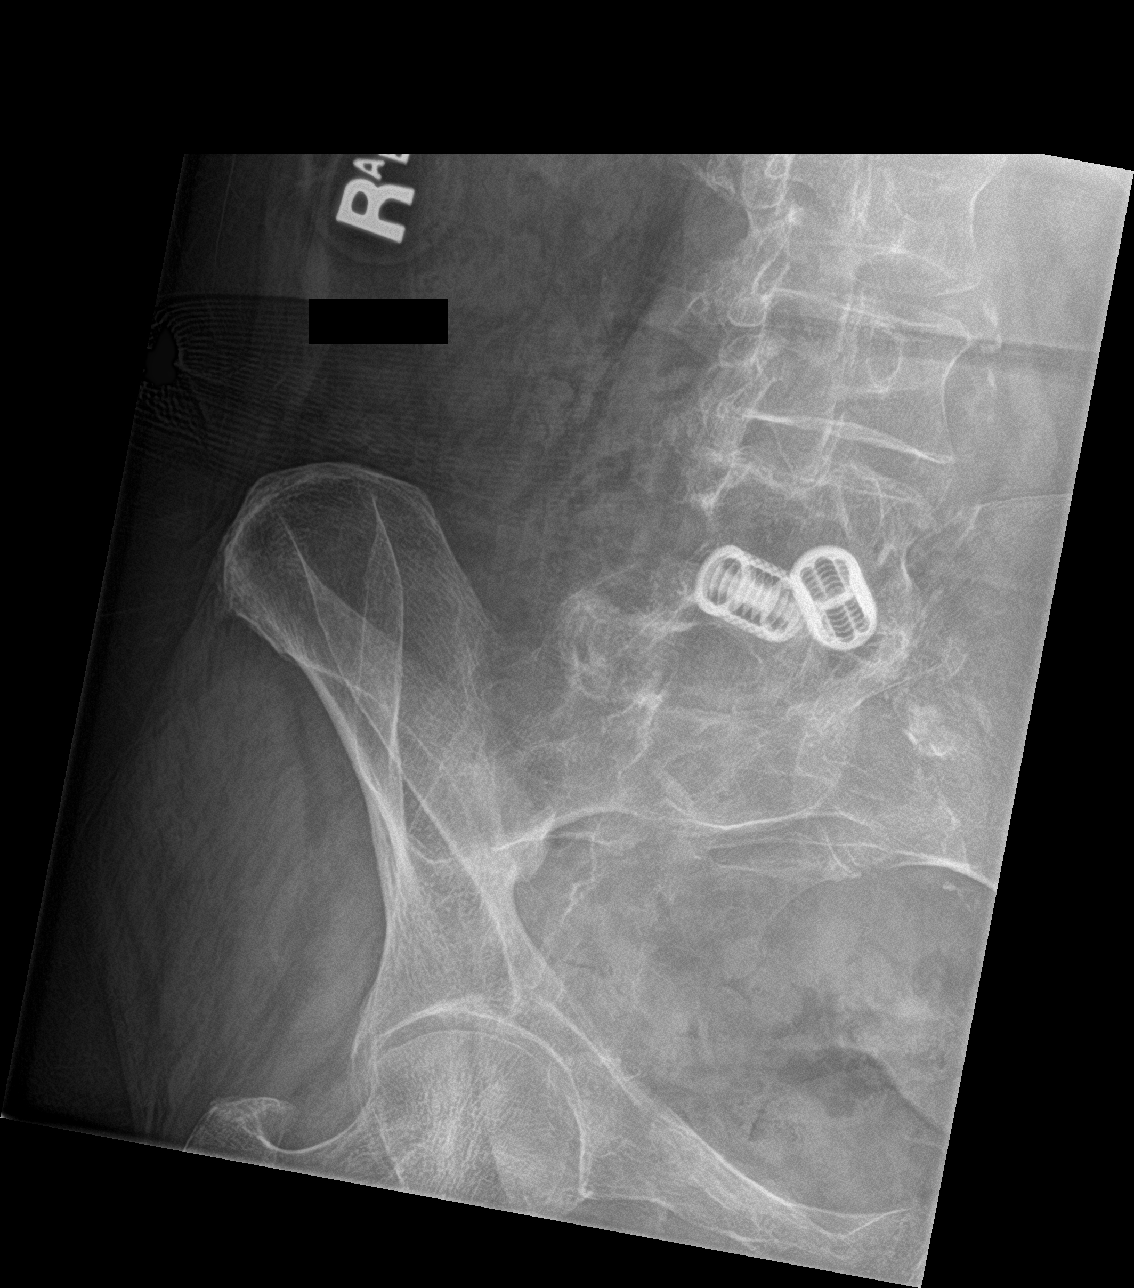

[4 of 4 positions shown; findings below may reference images not displayed]

FINDINGS: Normal SI joint bilaterally. No erosion or mass. No significant
arthropathy. Threaded cage fusion L4-5. Generalized osteopenia.

Chronic fractures of the pubic   on the right.  Left hip pinning
IMPRESSION: Negative SI joints

Chronic right pubic fracture

## 2017-06-21 IMAGING — CR DG LUMBAR SPINE COMPLETE W/ BEND
1 series · 7 of 7 positions shown · non-contrast
Comparison: [DATE]

CLINICAL DATA: Chronic bilateral low back pain with bilateral
sciatica

EXAM:
LUMBAR SPINE - COMPLETE WITH BENDING VIEWS

[Series 1: dg lumbar spine complete w/bend 6+v · 0.14mm/px · 7 of 7 slices shown]
[im 1/7]
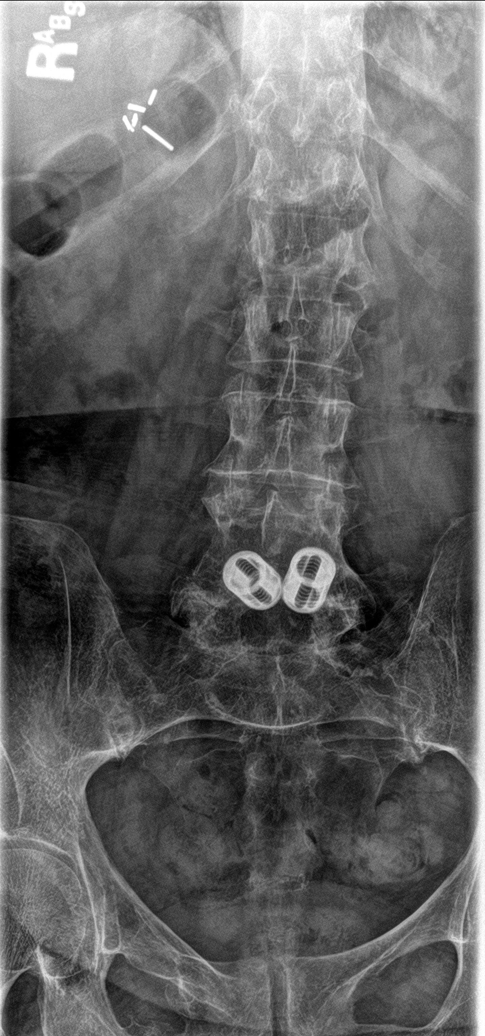
[im 2/7]
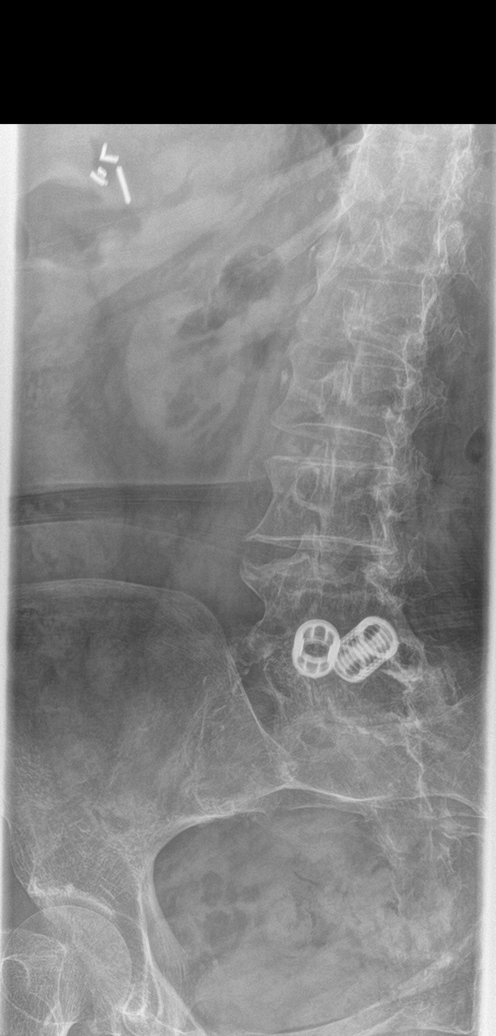
[im 3/7]
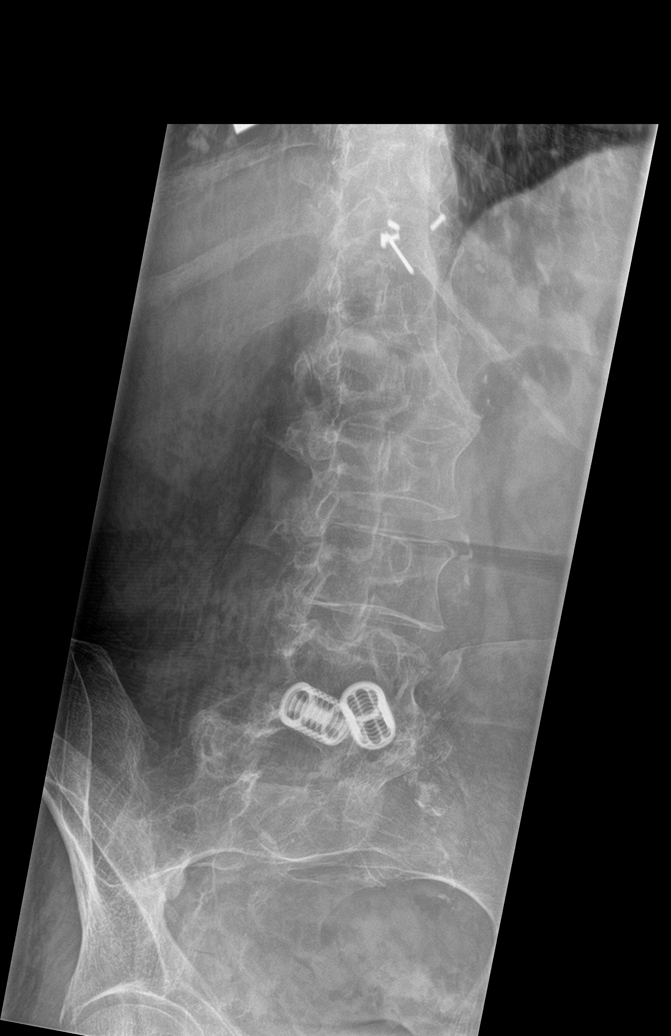
[im 4/7]
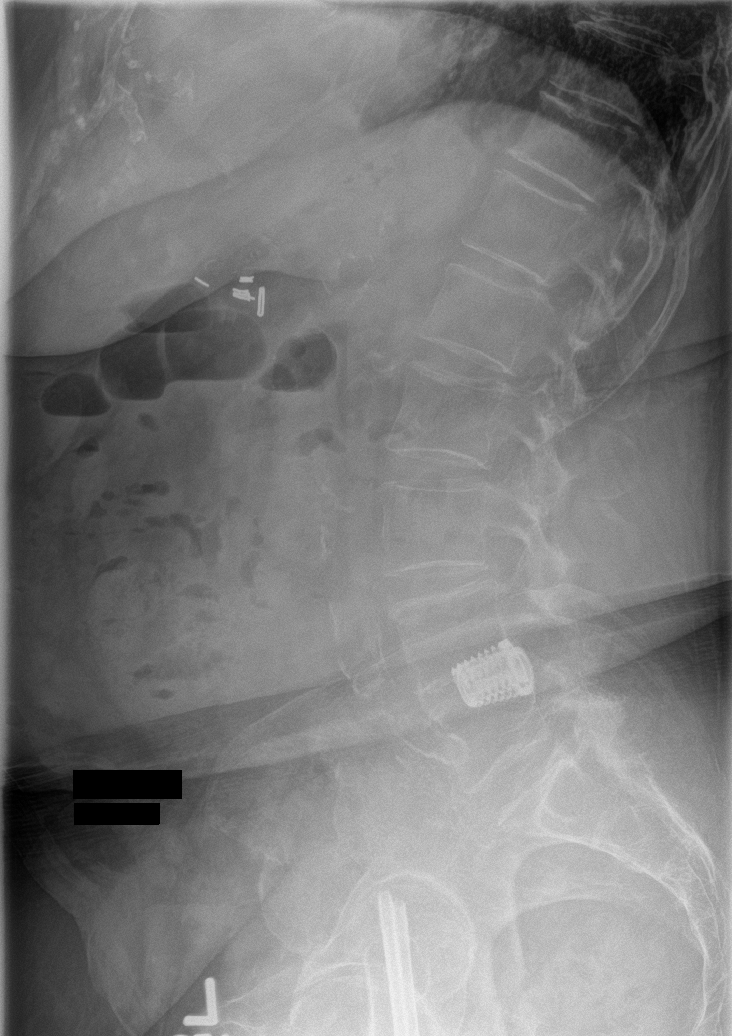
[im 5/7]
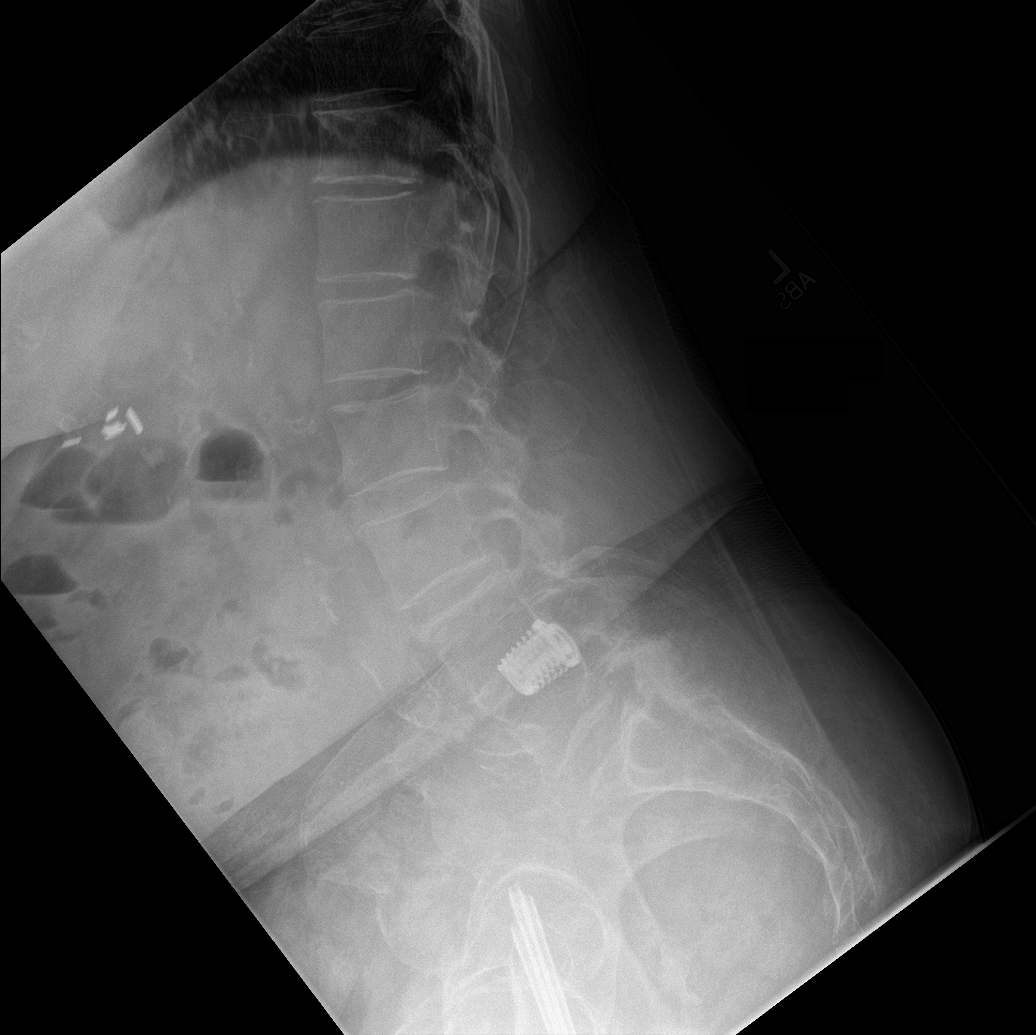
[im 6/7]
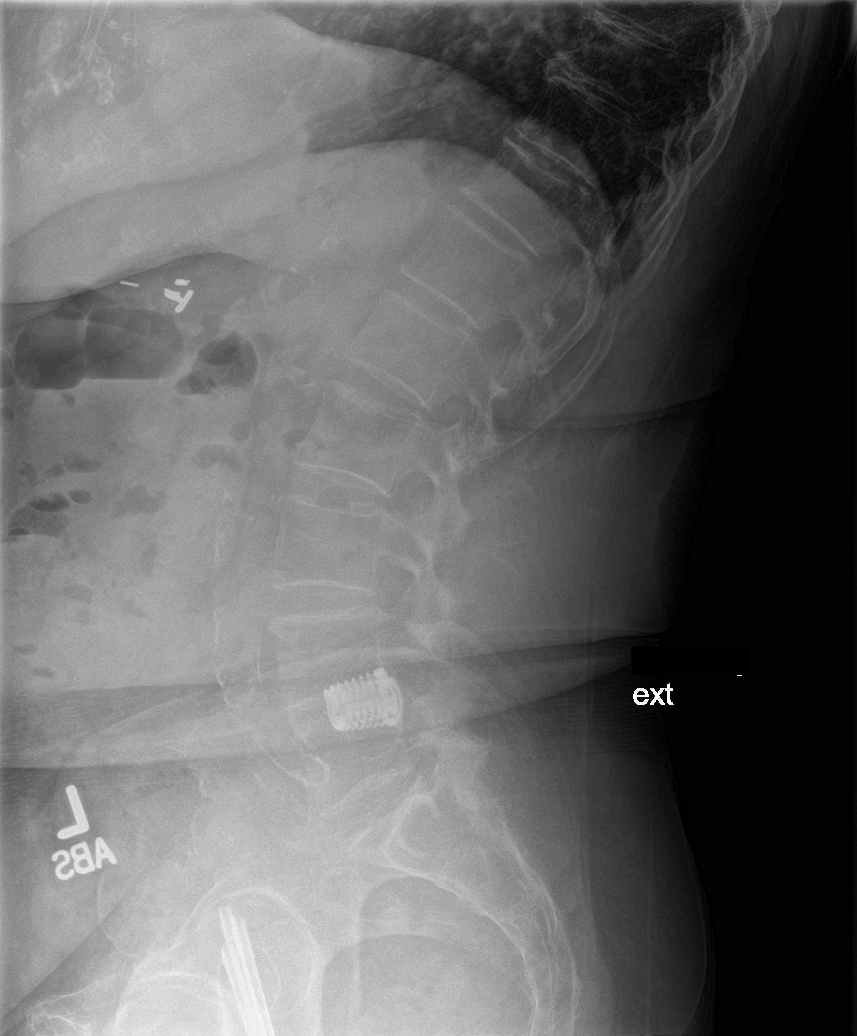
[im 7/7]
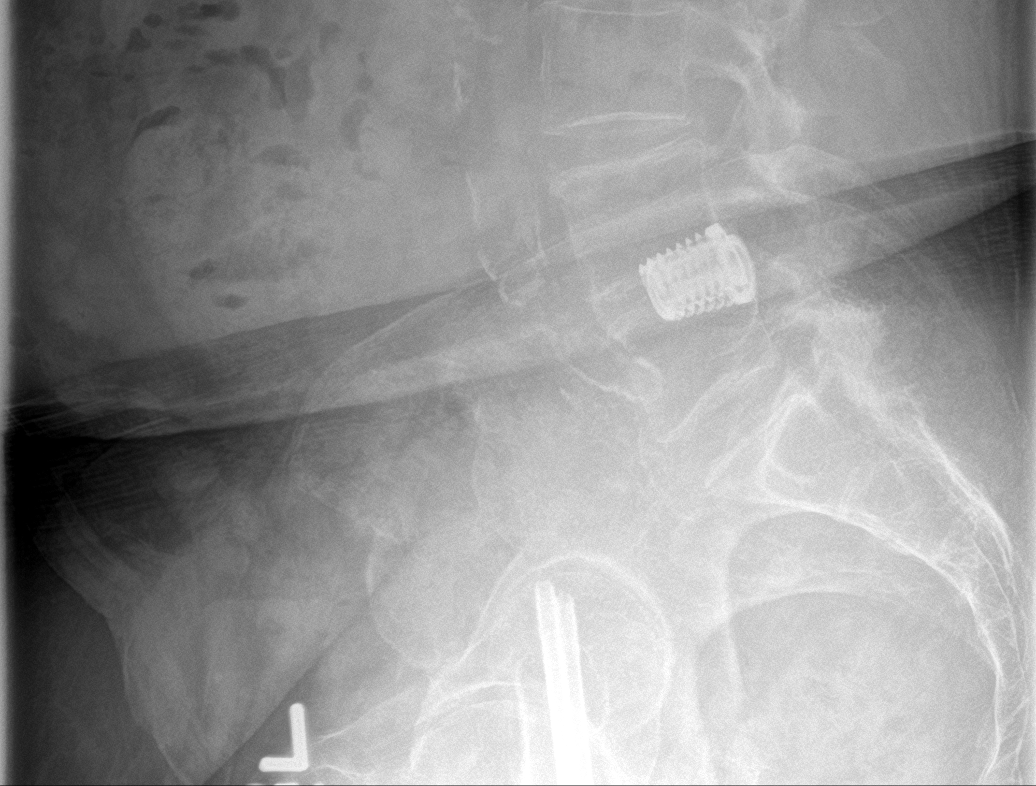

[7 of 7 positions shown; findings below may reference images not displayed]

FINDINGS: Negative for fracture.  Extensive osteopenia.  Normal alignment.

Threaded interbody cages L4-5 in good position and unchanged.
Negative for pars defect. Mild facet degeneration at L5-S1 without
significant disc space narrowing

Mild atherosclerotic calcification

Cholecystectomy clips. Left hip pinning. Chronic fracture right
pubic bone
IMPRESSION: Advanced osteopenia.  Negative for fracture.

Interbody fusion L4-5. Otherwise no significant disc degeneration.
Facet degeneration L5-S1.

## 2017-06-21 NOTE — Progress Notes (Signed)
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 included cigarettes. She has a 102.00 pack-year smoking history. she has never used smokeless tobacco. Medical:  has a past medical history of Adenocarcinoma of right lung, stage 1 ([place]) (07/28/2016), Anxiety, Cancer ([place]), COPD (chronic obstructive pulmonary disease) ([place]), Cyst, Depression, GERD (gastroesophageal reflux disease), Heart murmur, Hyperlipidemia, Hypertension, Low back pain, Lumbar back pain, Osteoarthritis, and Osteoporosis. Family: family history includes Cancer in her sister; Heart disease in her brother, father, and mother.  Past Surgical History:  Procedure Laterality Date  . ANKLE SURGERY Right    horse accident  . APPENDECTOMY    . BACK SURGERY    . CHOLECYSTECTOMY    . EYE SURGERY     lense implant  . FRACTURE SURGERY    . HIP SURGERY  01/2010   Left Hip   . INNER EAR SURGERY Bilateral 1970   related to severe ear infections  . KNEE SURGERY     Right knee  . LOBECTOMY Right 05/09/2016   Procedure: RIGHT UPPER LOBECTOMY;  Surgeon: [name], MD;  Location: [place];  Service: Thoracic;  Laterality: Right;  .  MASS EXCISION  08/25/2011   Procedure: EXCISION MASS;  Surgeon: Harl Bowie, MD;  Location: [place];  Service: General;  Laterality: N/A;  excision left neck mass  . TOTAL ABDOMINAL HYSTERECTOMY    . VIDEO ASSISTED THORACOSCOPY (VATS)/WEDGE RESECTION Right 05/09/2016   Procedure: VIDEO ASSISTED THORACOSCOPY (VATS)/WEDGE RESECTION;  Surgeon: [name], MD;  Location: [place];  Service: Thoracic;  Laterality: Right;   Active Ambulatory Problems    Diagnosis Date Noted  . Infected sebaceous cyst 08/16/2011  . COPD (chronic obstructive pulmonary disease) ([place]) 04/27/2015  . Acute on chronic respiratory failure with hypoxia ([place]) 04/27/2015  .  COPD exacerbation ([place]) 08/29/2015  . HTN (hypertension) 08/29/2015  . HLD (hyperlipidemia) 08/29/2015  . Chronic low back pain 08/29/2015  . CKD (chronic kidney disease), stage III ([place]) 08/29/2015  . Heart murmur   . DVT (deep venous thrombosis) ([place]) 04/26/2016  . S/P lobectomy of lung 05/09/2016  . Adenocarcinoma of right lung, stage 1 ([place]) 07/28/2016  . 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 Chronic post-thoracotomy pain 03/03/2017  . Chronic pain of lower extremity [facility] Area of Pain) (B (R>L) 06/21/2017  . Chronic hip pain (Fourth Area of Pain) (Bilateral) (R>L) 06/21/2017  . Chronic sacroiliac joint pain 06/21/2017  . Disorder of bone, unspecified 06/21/2017  . Other long term (current) drug therapy 06/21/2017  . Other specified health status 06/21/2017  . Chronic chest wall pain(Primary Area of Pain) (R) 06/21/2017   Resolved Ambulatory Problems    Diagnosis Date Noted  . AKI (acute kidney injury) ([place]) 08/29/2015   Past Medical History:  Diagnosis Date  . Adenocarcinoma of right lung, stage 1 ([place]) 07/28/2016  . Anxiety   . Cancer ([place])   . COPD (chronic obstructive pulmonary disease) ([place])   . Cyst   . Depression   . GERD (gastroesophageal reflux disease)   . Heart murmur   .  Hyperlipidemia   . Hypertension   . Low back pain   . Lumbar back pain   . Osteoarthritis   . Osteoporosis    Constitutional Exam  General appearance: Well nourished, well developed, and well hydrated. In no apparent acute distress Vitals:   06/21/17 0831  BP: (!) 153/85  Pulse: 64  Resp: 18  Temp: 98.3 F (36.8 C)  TempSrc: Oral  SpO2: 98%  Weight: 163 lb (73.9 kg)  Height: 5' 1.5" (1.562 m)   BMI Assessment: Estimated body mass index is 30.3 kg/m as calculated from the following:   Height as of this encounter: 5' 1.5" (1.562 m).   Weight as of this encounter: 163 lb (73.9 kg).  BMI interpretation table: BMI level Category Range association with higher incidence of chronic pain  <18 kg/m2 Underweight   18.5-24.9 kg/m2 Ideal body weight   25-29.9 kg/m2 Overweight Increased incidence by 20%  30-34.9 kg/m2 Obese (Class I) Increased incidence by 68%  35-39.9 kg/m2 Severe obesity (Class II) Increased incidence by 136%  >40 kg/m2 Extreme obesity (Class III) Increased incidence by 254%   BMI Readings from Last 4 Encounters:  06/21/17 30.30 kg/m  05/30/17 29.26 kg/m  02/02/17 28.72 kg/m  11/01/16 27.62 kg/m   Wt Readings from Last 4 Encounters:  06/21/17 163 lb (73.9 kg)  05/30/17 160 lb (72.6 kg)  02/02/17 157 lb (71.2 kg)  11/01/16 151 lb (68.5 kg)  Psych/Mental status: Alert, oriented x 3 (person, place, & time)       Eyes: PERLA Respiratory: No evidence of acute respiratory distress  Thoracic Exam  Inspection: Well healed scar from previous spine surgery detected Alignment: Symmetrical Functional ROM: Pain restricted ROM Stability: No instability detected Sensory: Dermatomal pain pattern Muscle strength & Tone: Complains of area being tender to palpation  Lumbar Spine Exam  Inspection: Well healed scar from previous spine surgery detected Alignment: Symmetrical Functional ROM: Pain restricted ROM      Stability: No instability detected Muscle strength &  Tone: Functionally intact Sensory: Unimpaired Palpation: Complains of area being tender to palpation       Provocative Tests: Lumbar Hyperextension and rotation test: Positive bilaterally for facet joint pain. Patrick's Maneuver: Positive for bilateral  S-I arthralgia and for bilateral hip arthralgia  Gait & Posture Assessment  Ambulation: Unassisted Gait: Antalgic Posture: Difficulty with positional changes   Lower Extremity Exam    Side: Right lower extremity  Side: Left lower extremity  Inspection: No masses, redness, swelling, or asymmetry. No contractures  Inspection: No masses, redness, swelling, or asymmetry. No contractures  Functional ROM: Restricted ROM for hip joint  Functional ROM: Restricted ROM for hip joint  Muscle strength & Tone: Functionally intact  Muscle strength & Tone: Functionally intact  Sensory: Unimpaired  Sensory: Unimpaired  Palpation: No palpable anomalies  Palpation: No palpable anomalies   Assessment  Primary Diagnosis & Pertinent Problem List: The primary encounter diagnosis was Chronic chest wall pain(Primary Area of Pain) (R). Diagnoses of Chronic bilateral low back pain with bilateral sciatica (Secondary Area of Pain) (Bilateral) (R>L), Chronic pain of both lower extremities, Chronic pain of both hips, Chronic sacroiliac joint pain, Disorder of bone, unspecified, Other long term (current) drug therapy, Other specified health status, and Chronic pain syndrome were also pertinent to this visit.  Visit Diagnosis: 1. Chronic chest wall pain(Primary Area of Pain) (R)   2. Chronic bilateral low back pain with bilateral sciatica (Secondary Area of Pain) (Bilateral) (R>L)   3. Chronic pain of both lower extremities   4. Chronic pain of both hips   5. Chronic sacroiliac joint pain   6. Disorder of bone, unspecified   7. Other long term (current) drug therapy   8. Other specified health status   9. Chronic pain syndrome    Plan of Care  Initial treatment  plan:  Please be advised that as per protocol, today's visit has been an evaluation only. We have not taken over the patient's controlled substance management.  Problem-specific plan: No problem-specific Assessment & Plan notes found for this encounter.  Ordered Lab-work, Procedure(s), Referral(s), & Consult(s): Orders Placed This Encounter  Procedures  . DG Lumbar Spine Complete W/Bend  . DG Si Joints  . DG HIP UNILAT W OR W/O PELVIS 2-3 VIEWS LEFT  . DG HIP UNILAT W OR W/O PELVIS 2-3 VIEWS RIGHT  . Compliance Drug Analysis, Ur  . Comp. Metabolic Panel (12)  . C-reactive protein  . Sedimentation rate  . Magnesium  . 25-Hydroxyvitamin D Lcms D2+D3  . Vitamin B12  . Ambulatory referral to Psychology   Pharmacotherapy: Medications ordered:  No orders of the defined types were placed in this encounter.  Medications administered during this visit: [name] had no medications administered during this visit.   Pharmacotherapy under consideration:  Opioid Analgesics: The patient was informed that there is no guarantee that she would be a candidate for opioid analgesics. The decision will be made following CDC guidelines. This decision will be based on the results of diagnostic studies, as well as Ms. Gange's risk profile.  Membrane stabilizer: To be determined at a later time Muscle relaxant: To be determined at a later time NSAID: To be determined at a later time Other analgesic(s): To be determined at a later time   Interventional therapies under consideration: Ms. Damewood was informed that there is no guarantee that she would be a candidate for interventional therapies. The decision will be based on the results of diagnostic studies, as well as Ms. Huante's risk profile.  Possible procedure(s): Diagnostic Right-sided thoracic nerve block Possible right-sided thoracic artifact Diagnostic bilateral lumbar ESI Diagnostic bilateral lumbar facet nerve block Possible  bilateral lumbar facet RFA Diagnostic bilateral SI joint nerve block Possible  bilateral SI joint RFA    Provider-requested follow-up: Return for 2nd Visit, w/ Dr. Dossie Arbour, after [facility] eval.  Future Appointments  Date Time Provider [place]  07/27/2017 10:00 AM [facility] 1 [facility] None  08/03/2017 10:30 AM Curt Bears, MD [facility] None  05/29/2018  9:30 AM [name], MD [facility]    Primary Care Physician: Wenda Low, MD Location: [facility] Note by:  Date: 06/21/2017; Time: 3:24 PM  Pain Score Disclaimer: We use the NRS-11 scale. This is a self-reported, subjective measurement of pain severity with only modest accuracy. It is used primarily to identify changes within a particular patient. It must be understood that outpatient pain scales are significantly less accurate that those used for research, where they can be applied under ideal controlled circumstances with minimal exposure to variables. In reality, the score is likely to be a combination of pain intensity and pain affect, where pain affect describes the degree of emotional arousal or changes in action readiness caused by the sensory experience of pain. Factors such as social and work situation, setting, emotional state, anxiety levels, expectation, and prior pain experience may influence pain perception and show large inter-individual differences that may also be affected by time variables.  Patient instructions provided during this appointment: Patient Instructions    ____________________________________________________________________________________________  Appointment Policy Summary  It is our goal and responsibility to provide the medical community with assistance in the evaluation and management of patients with chronic pain. Unfortunately our resources are limited. Because we do not have an unlimited amount of time, or available appointments,  we are required to closely monitor and manage their use. The following rules exist to maximize their use:  Patient's responsibilities: 1. Punctuality:  At what time should I arrive? You should be physically present in our office 30 minutes before your scheduled appointment. Your scheduled appointment is with your assigned healthcare provider. However, it takes 5-10 minutes to be "checked-in", and another 15 minutes for the nurses to do the admission. If you arrive to our office at the time you were given for your appointment, you will end up being at least 20-25 minutes late to your appointment with the provider. 2. Tardiness:  What happens if I arrive only a few minutes after my scheduled appointment time? You will need to reschedule your appointment. The cutoff is your appointment time. This is why it is so important that you arrive at least 30 minutes before that appointment. If you have an appointment scheduled for 10:00 AM and you arrive at 10:01, you will be required to reschedule your appointment.  3. Plan ahead:  Always assume that you will encounter traffic on your way in. Plan for it. If you are dependent on a driver, make sure they understand these rules and the need to arrive early. 4. Other appointments and responsibilities:  Avoid scheduling any other appointments before or after your pain clinic appointments.  5. Be prepared:  Write down everything that you need to discuss with your healthcare provider and give this information to the admitting nurse. Write down the medications that you will need refilled. Bring your pills and bottles (even the empty ones), to all of your appointments, except for those where a procedure is scheduled. 6. No children or pets:  Find someone to take care of them. It is not appropriate to bring them in. 7. Scheduling changes:  We request "advanced notification" of any changes or cancellations. 8. Advanced notification:  Defined as a  time period of more  than 24 hours prior to the originally scheduled appointment. This allows for the appointment to be offered to other patients. 9. Rescheduling:  When a visit is rescheduled, it will require the cancellation of the original appointment. For this reason they both fall within the category of "Cancellations".  10. Cancellations:  They require advanced notification. Any cancellation less than 24 hours before the  appointment will be recorded as a "No Show". 11. No Show:  Defined as an unkept appointment where the patient failed to notify or declare to the practice their intention or inability to keep the appointment.  Corrective process for repeat offenders:  1. Tardiness: Three (3) episodes of rescheduling due to late arrivals will be recorded as one (1) "No Show". 2. Cancellation or reschedule: Three (3) cancellations or rescheduling will be recorded as one (1) "No Show". 3. "No Shows": Three (3) "No Shows" within a 12 month period will result in discharge from the practice.  ____________________________________________________________________________________________   ____________________________________________________________________________________________  Pain Scale  Introduction: The pain score used by this practice is the Verbal Numerical Rating Scale (VNRS-11). This is an 11-point scale. It is for adults and children 10 years or older. There are significant differences in how the pain score is reported, used, and applied. Forget everything you learned in the past and learn this scoring system.  General Information: The scale should reflect your current level of pain. Unless you are specifically asked for the level of your worst pain, or your average pain. If you are asked for one of these two, then it should be understood that it is over the past 24 hours.  Basic Activities of Daily Living (ADL): Personal hygiene, dressing, eating, transferring, and using restroom.  Instructions: Most  patients tend to report their level of pain as a combination of two factors, their physical pain and their psychosocial pain. This last one is also known as "suffering" and it is reflection of how physical pain affects you socially and psychologically. From now on, report them separately. From this point on, when asked to report your pain level, report only your physical pain. Use the following table for reference.  Pain Clinic Pain Levels (0-5/10)  Pain Level Score  Description  No Pain 0   Mild pain 1 Nagging, annoying, but does not interfere with basic activities of daily living (ADL). Patients are able to eat, bathe, get dressed, toileting (being able to get on and off the toilet and perform personal hygiene functions), transfer (move in and out of bed or a chair without assistance), and maintain continence (able to control bladder and bowel functions). Blood pressure and heart rate are unaffected. A normal heart rate for a healthy adult ranges from 60 to 100 bpm (beats per minute).   Mild to moderate pain 2 Noticeable and distracting. Impossible to hide from other people. More frequent flare-ups. Still possible to adapt and function close to normal. It can be very annoying and may have occasional stronger flare-ups. With discipline, patients may get used to it and adapt.   Moderate pain 3 Interferes significantly with activities of daily living (ADL). It becomes difficult to feed, bathe, get dressed, get on and off the toilet or to perform personal hygiene functions. Difficult to get in and out of bed or a chair without assistance. Very distracting. With effort, it can be ignored when deeply involved in activities.   Moderately severe pain 4 Impossible to ignore for more than a few minutes. With effort,  patients may still be able to manage work or participate in some social activities. Very difficult to concentrate. Signs of autonomic nervous system discharge are evident: dilated pupils (mydriasis);  mild sweating (diaphoresis); sleep interference. Heart rate becomes elevated (>115 bpm). Diastolic blood pressure (lower number) rises above 100 mmHg. Patients find relief in laying down and not moving.   Severe pain 5 Intense and extremely unpleasant. Associated with frowning face and frequent crying. Pain overwhelms the senses.  Ability to do any activity or maintain social relationships becomes significantly limited. Conversation becomes difficult. Pacing back and forth is common, as getting into a comfortable position is nearly impossible. Pain wakes you up from deep sleep. Physical signs will be obvious: pupillary dilation; increased sweating; goosebumps; brisk reflexes; cold, clammy hands and feet; nausea, vomiting or dry heaves; loss of appetite; significant sleep disturbance with inability to fall asleep or to remain asleep. When persistent, significant weight loss is observed due to the complete loss of appetite and sleep deprivation.  Blood pressure and heart rate becomes significantly elevated. Caution: If elevated blood pressure triggers a pounding headache associated with blurred vision, then the patient should immediately seek attention at an urgent or emergency care unit, as these may be signs of an impending stroke.    Emergency Department Pain Levels (6-10/10)  Emergency Room Pain 6 Severely limiting. Requires emergency care and should not be seen or managed at an outpatient pain management facility. Communication becomes difficult and requires great effort. Assistance to reach the emergency department may be required. Facial flushing and profuse sweating along with potentially dangerous increases in heart rate and blood pressure will be evident.   Distressing pain 7 Self-care is very difficult. Assistance is required to transport, or use restroom. Assistance to reach the emergency department will be required. Tasks requiring coordination, such as bathing and getting dressed become very  difficult.   Disabling pain 8 Self-care is no longer possible. At this level, pain is disabling. The individual is unable to do even the most "basic" activities such as walking, eating, bathing, dressing, transferring to a bed, or toileting. Fine motor skills are lost. It is difficult to think clearly.   Incapacitating pain 9 Pain becomes incapacitating. Thought processing is no longer possible. Difficult to remember your own name. Control of movement and coordination are lost.   The worst pain imaginable 10 At this level, most patients pass out from pain. When this level is reached, collapse of the autonomic nervous system occurs, leading to a sudden drop in blood pressure and heart rate. This in turn results in a temporary and dramatic drop in blood flow to the brain, leading to a loss of consciousness. Fainting is one of the body's self defense mechanisms. Passing out puts the brain in a calmed state and causes it to shut down for a while, in order to begin the healing process.    Summary: 1. Refer to this scale when providing [place] with your pain level. 2. Be accurate and careful when reporting your pain level. This will help with your care. 3. Over-reporting your pain level will lead to loss of credibility. 4. Even a level of 1/10 means that there is pain and will be treated at our facility. 5. High, inaccurate reporting will be documented as "Symptom Exaggeration", leading to loss of credibility and suspicions of possible secondary gains such as obtaining more narcotics, or wanting to appear disabled, for fraudulent reasons. 6. Only pain levels of 5 or below will be seen at  our facility. 7. Pain levels of 6 and above will be sent to the Emergency Department and the appointment cancelled. ____________________________________________________________________________________________

## 2017-06-21 NOTE — Progress Notes (Signed)
Safety precautions to be maintained throughout the outpatient stay will include: orient to surroundings, keep bed in low position, maintain call bell within reach at all times, provide assistance with transfer out of bed and ambulation.  

## 2017-06-21 NOTE — Patient Instructions (Signed)

## 2017-06-22 NOTE — Progress Notes (Signed)
Results were reviewed and found to be: within normal limits  No acute injury or pathology identified  Review would suggest interventional pain management techniques may be of benefit

## 2017-06-22 NOTE — Progress Notes (Signed)
Results were reviewed and found to be: mildly abnormal  No acute injury or pathology identified  Review would suggest interventional pain management techniques may be of benefit 

## 2017-06-26 LAB — COMPLIANCE DRUG ANALYSIS, UR

## 2017-07-10 ENCOUNTER — Other Ambulatory Visit: Payer: Self-pay | Admitting: Internal Medicine

## 2017-07-10 ENCOUNTER — Ambulatory Visit
Admission: RE | Admit: 2017-07-10 | Discharge: 2017-07-10 | Disposition: A | Discharge status: Home / Self Care | Payer: Medicare Other | Source: Ambulatory Visit | Attending: Internal Medicine | Admitting: Internal Medicine

## 2017-07-10 DIAGNOSIS — J441 Chronic obstructive pulmonary disease with (acute) exacerbation: Secondary | ICD-10-CM

## 2017-07-10 DIAGNOSIS — R05 Cough: Secondary | ICD-10-CM

## 2017-07-10 DIAGNOSIS — R059 Cough, unspecified: Secondary | ICD-10-CM

## 2017-07-10 IMAGING — CR DG CHEST 2V
2 series · 2 of 2 positions shown · non-contrast
Comparison: Chest x-ray of [DATE]

CLINICAL DATA: Increasing shortness of breath and cough, suspect
COPD exacerbation. History of previous right upper lobectomy for
malignancy.

EXAM:
CHEST  2 VIEW

[w chest pa]
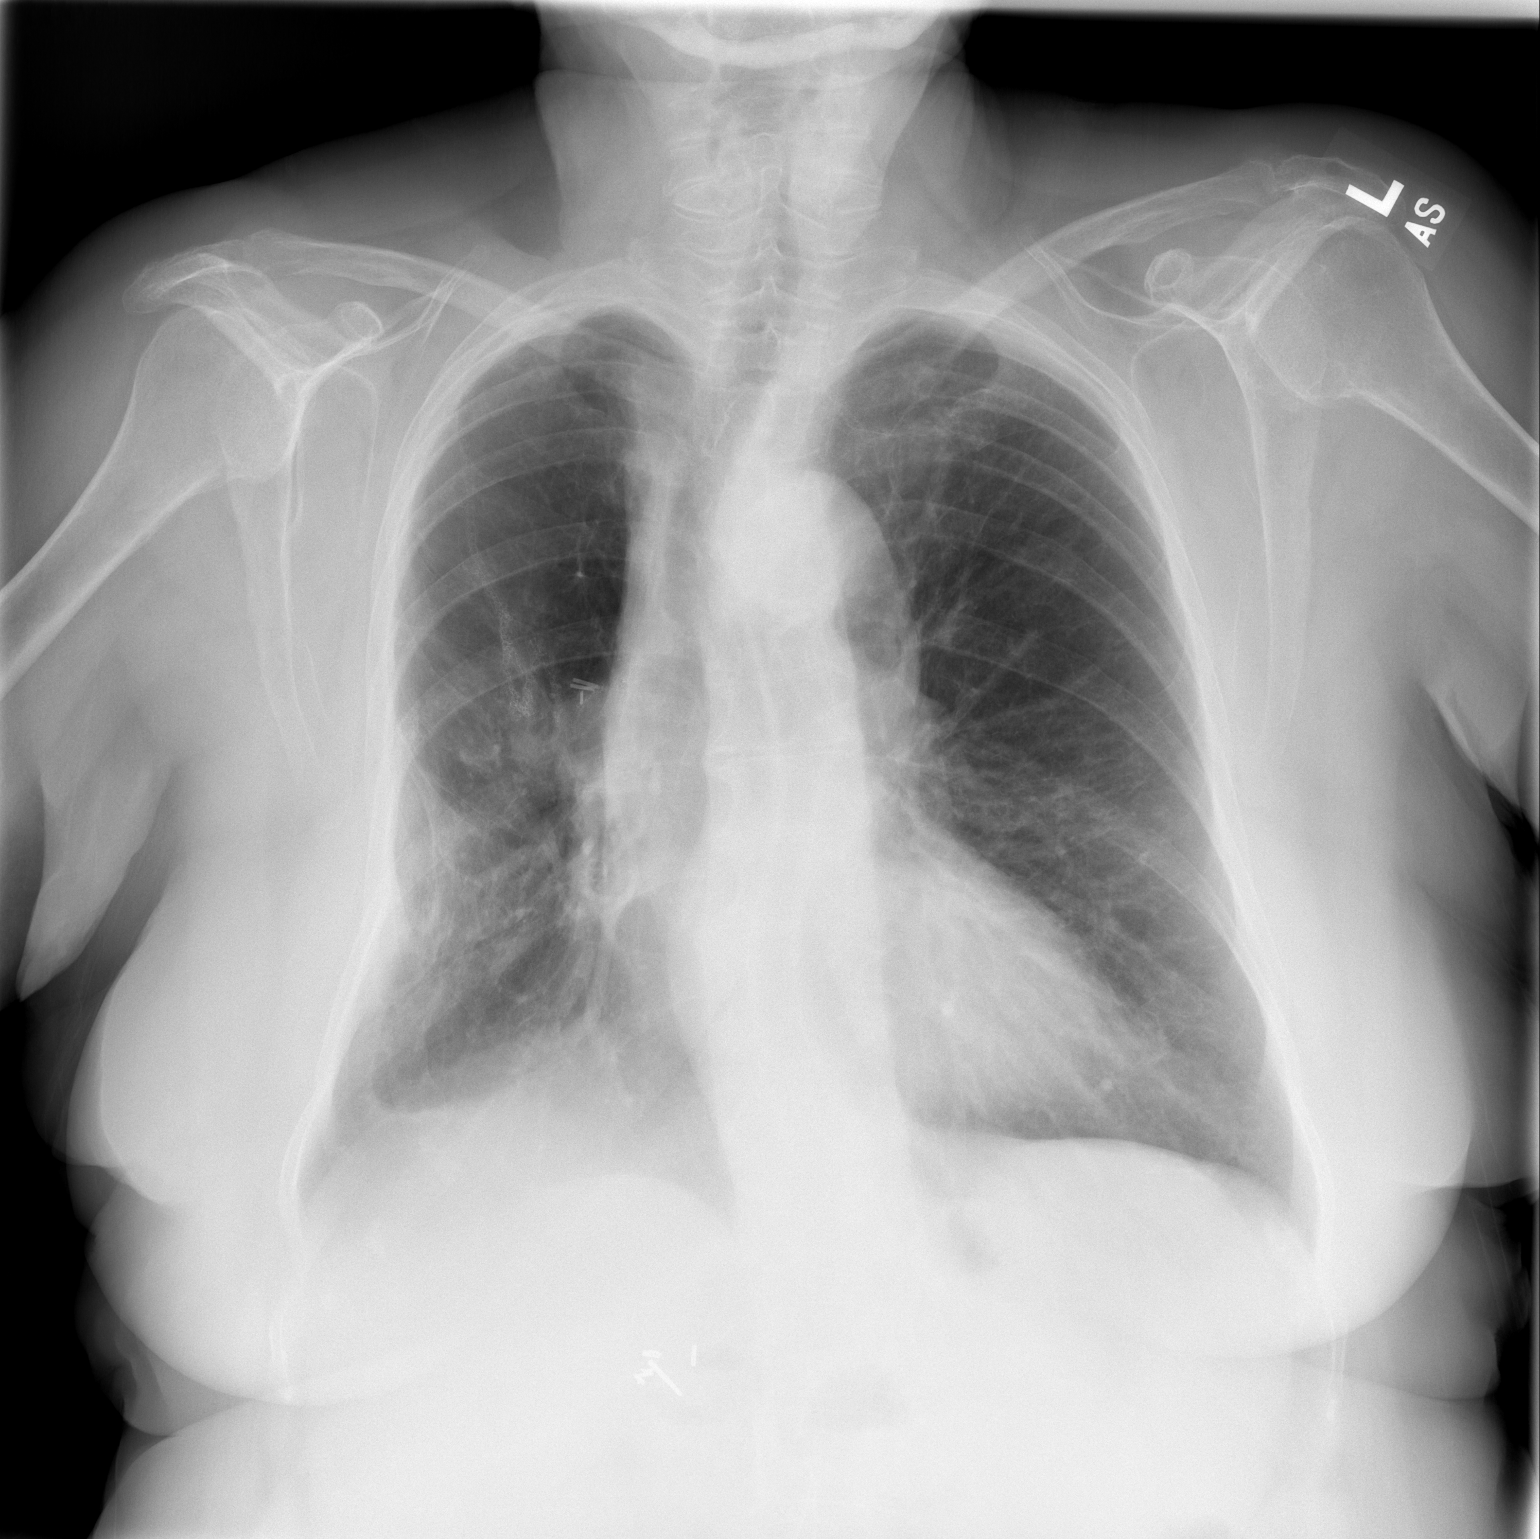

[w chest lat]
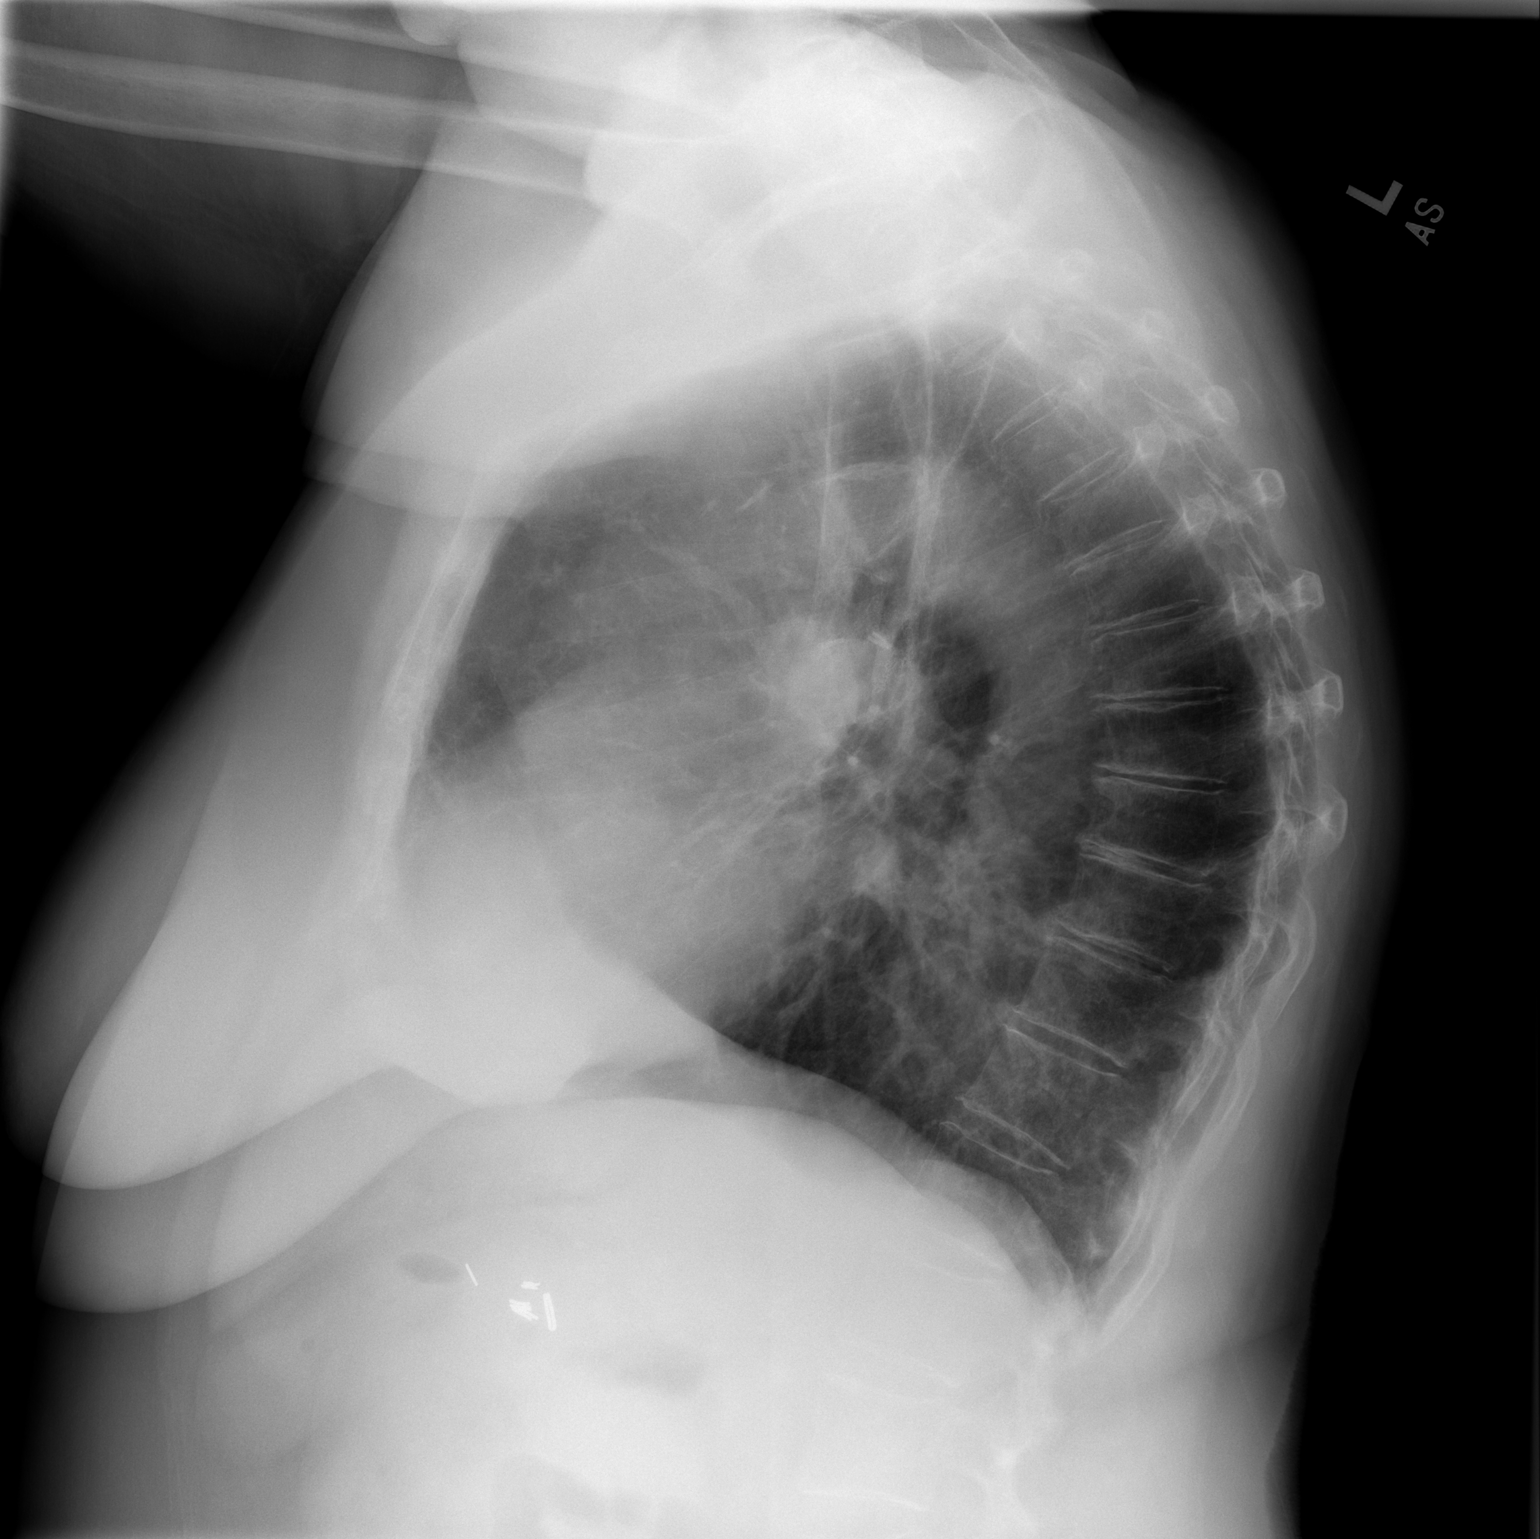

[2 of 2 positions shown; findings below may reference images not displayed]

FINDINGS: The left lung is clear. Mild stable mediastinal shift toward the
right is present due to volume loss. Increased density in the right
mid to lower lung is present but slightly less conspicuous than on
the previous study. There is soft tissue fullness in the right hilar
region which is stable. The heart and pulmonary vascularity are
normal. There is tortuosity of the descending thoracic aorta. There
is calcification within the wall of the aortic arch. There is no
pleural effusion. There is multilevel degenerative disc disease of
the thoracic spine.
IMPRESSION: COPD. Postsurgical changes in the right mid to lower lung. No
findings worrisome for pneumonia or recurrent malignancy.

Thoracic aortic atherosclerosis.

## 2017-07-26 ENCOUNTER — Other Ambulatory Visit: Payer: Self-pay | Admitting: Medical Oncology

## 2017-07-26 DIAGNOSIS — C3491 Malignant neoplasm of unspecified part of right bronchus or lung: Secondary | ICD-10-CM

## 2017-07-27 ENCOUNTER — Other Ambulatory Visit (HOSPITAL_BASED_OUTPATIENT_CLINIC_OR_DEPARTMENT_OTHER): Payer: Medicare Other

## 2017-07-27 DIAGNOSIS — Z85118 Personal history of other malignant neoplasm of bronchus and lung: Secondary | ICD-10-CM | POA: Diagnosis not present

## 2017-07-27 DIAGNOSIS — C3491 Malignant neoplasm of unspecified part of right bronchus or lung: Secondary | ICD-10-CM

## 2017-07-27 LAB — CBC WITH DIFFERENTIAL/PLATELET
BASO%: 0.6 % (ref 0.0–2.0)
Basophils Absolute: 0 10*3/uL (ref 0.0–0.1)
EOS%: 1.8 % (ref 0.0–7.0)
Eosinophils Absolute: 0.1 10*3/uL (ref 0.0–0.5)
HCT: 39.3 % (ref 34.8–46.6)
HGB: 13.3 g/dL (ref 11.6–15.9)
LYMPH%: 18.3 % (ref 14.0–49.7)
MCH: 30.6 pg (ref 25.1–34.0)
MCHC: 33.8 g/dL (ref 31.5–36.0)
MCV: 90.5 fL (ref 79.5–101.0)
MONO#: 0.4 10*3/uL (ref 0.1–0.9)
MONO%: 5.1 % (ref 0.0–14.0)
NEUT#: 6 10*3/uL (ref 1.5–6.5)
NEUT%: 74.2 % (ref 38.4–76.8)
Platelets: 207 10*3/uL (ref 145–400)
RBC: 4.34 10*6/uL (ref 3.70–5.45)
RDW: 13.8 % (ref 11.2–14.5)
WBC: 8 10*3/uL (ref 3.9–10.3)
lymph#: 1.5 10*3/uL (ref 0.9–3.3)

## 2017-07-27 LAB — COMPREHENSIVE METABOLIC PANEL
ALT: 15 U/L (ref 0–55)
AST: 15 U/L (ref 5–34)
Albumin: 3.4 g/dL — ABNORMAL LOW (ref 3.5–5.0)
Alkaline Phosphatase: 52 U/L (ref 40–150)
Anion Gap: 8 mEq/L (ref 3–11)
BUN: 17 mg/dL (ref 7.0–26.0)
CO2: 28 mEq/L (ref 22–29)
Calcium: 8.8 mg/dL (ref 8.4–10.4)
Chloride: 104 mEq/L (ref 98–109)
Creatinine: 1.3 mg/dL — ABNORMAL HIGH (ref 0.6–1.1)
EGFR: 39 mL/min/{1.73_m2} — ABNORMAL LOW (ref >60)
Glucose: 107 mg/dl (ref 70–140)
Potassium: 3.7 mEq/L (ref 3.5–5.1)
Sodium: 140 mEq/L (ref 136–145)
Total Bilirubin: 0.38 mg/dL (ref 0.20–1.20)
Total Protein: 7 g/dL (ref 6.4–8.3)

## 2017-08-02 ENCOUNTER — Ambulatory Visit (HOSPITAL_COMMUNITY)
Admission: RE | Admit: 2017-08-02 | Discharge: 2017-08-02 | Disposition: A | Discharge status: Home / Self Care | Payer: Medicare Other | Source: Ambulatory Visit | Attending: Internal Medicine | Admitting: Internal Medicine

## 2017-08-02 ENCOUNTER — Encounter (HOSPITAL_COMMUNITY): Payer: Self-pay

## 2017-08-02 DIAGNOSIS — I2510 Atherosclerotic heart disease of native coronary artery without angina pectoris: Secondary | ICD-10-CM | POA: Diagnosis not present

## 2017-08-02 DIAGNOSIS — I517 Cardiomegaly: Secondary | ICD-10-CM | POA: Diagnosis not present

## 2017-08-02 DIAGNOSIS — I700 Atherosclerosis of aorta: Secondary | ICD-10-CM | POA: Diagnosis not present

## 2017-08-02 DIAGNOSIS — C3491 Malignant neoplasm of unspecified part of right bronchus or lung: Secondary | ICD-10-CM | POA: Diagnosis not present

## 2017-08-02 DIAGNOSIS — J439 Emphysema, unspecified: Secondary | ICD-10-CM | POA: Diagnosis not present

## 2017-08-02 IMAGING — CT CT CHEST W/ CM
2 of 4 series · 14 of 36 positions shown, 17 images · IV contrast (ISOVUE)
Comparison: [DATE]

CLINICAL DATA: Followup right lung adenocarcinoma. Status post
right upper lobectomy. Intermittent cough and shortness of breath.

EXAM:
CT CHEST WITH CONTRAST
TECHNIQUE: Multidetector CT imaging of the chest was performed during
intravenous contrast administration.
CONTRAST:  75mL [7A] IOPAMIDOL ([7A]) INJECTION 61%

[Series 2: axial st · axial · 0.78mm/px · z∈[+1445,+1685]mm · 11 of 140 slices shown, 14 images]
[im 10/140  mediastinal]
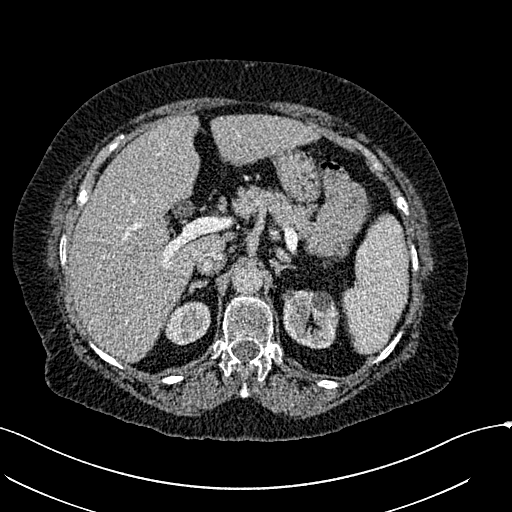
[im 10/140  lung]
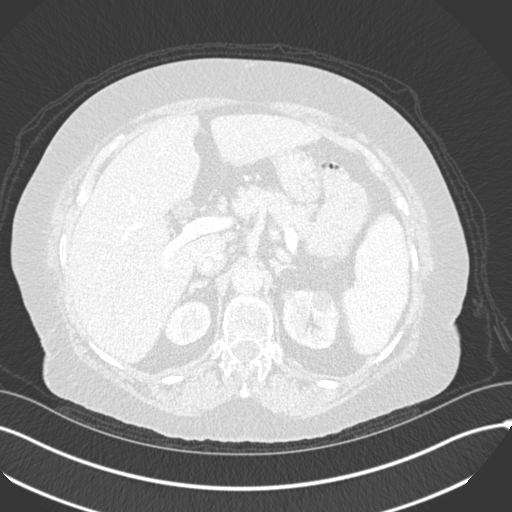
[im 20/140  lung]
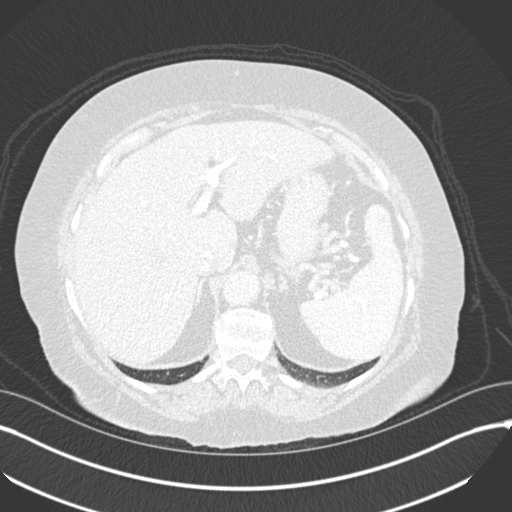
[im 30/140  lung]
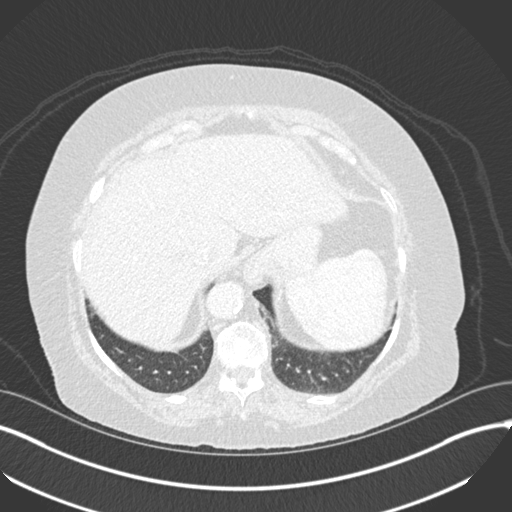
[im 50/140  lung]
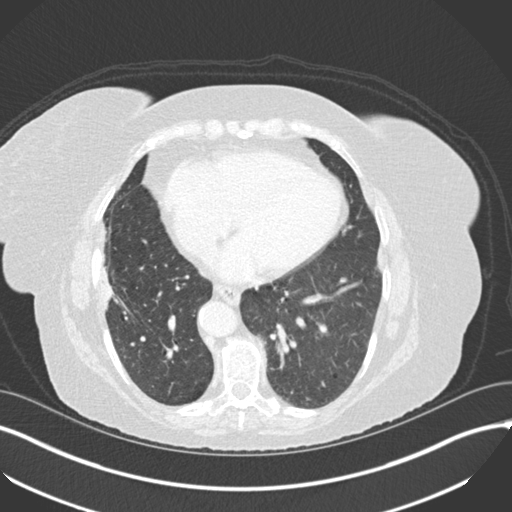
[im 60/140  mediastinal]
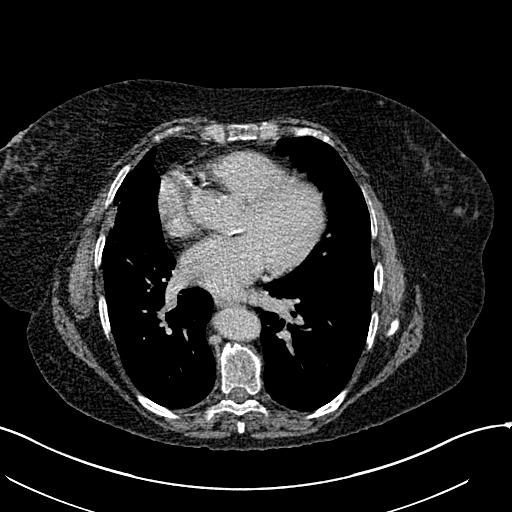
[im 60/140  lung]
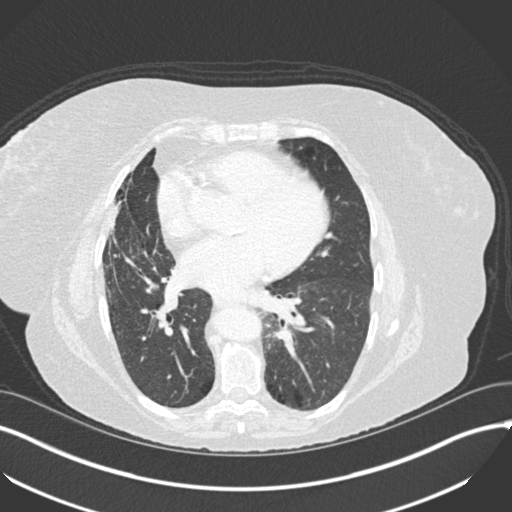
[im 70/140  lung]
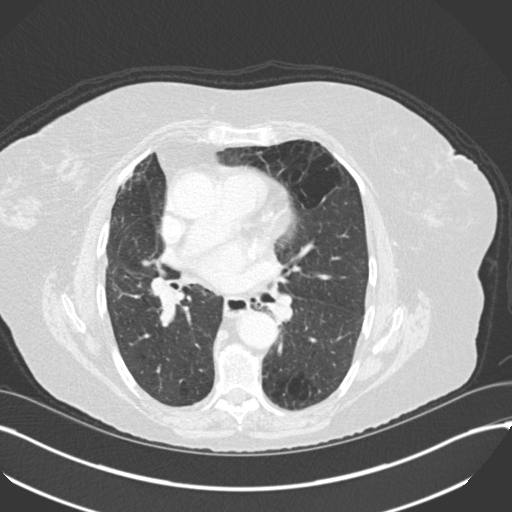
[im 80/140  lung]
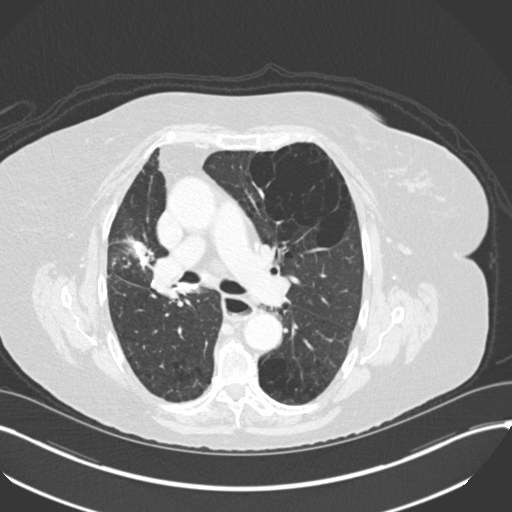
[im 90/140  lung]
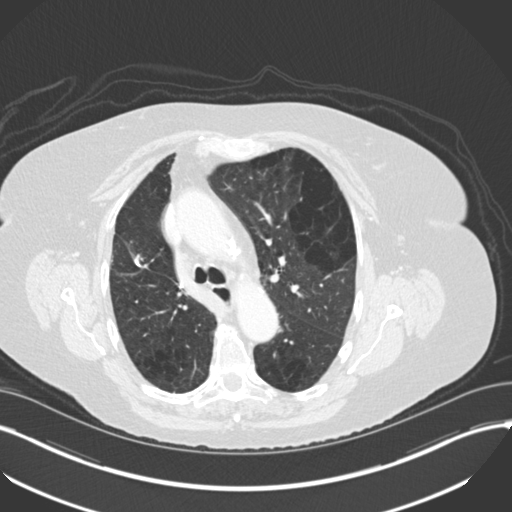
[im 110/140  mediastinal]
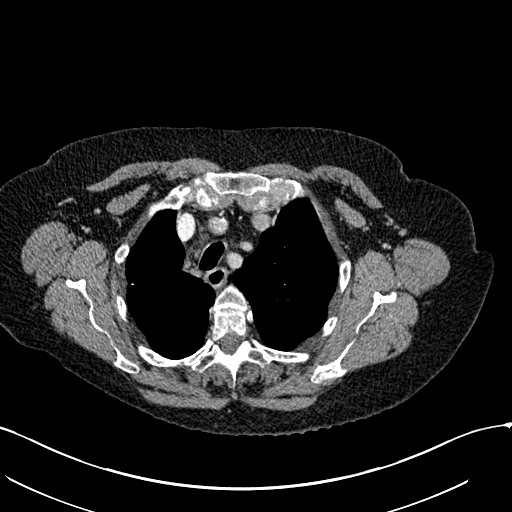
[im 110/140  lung]
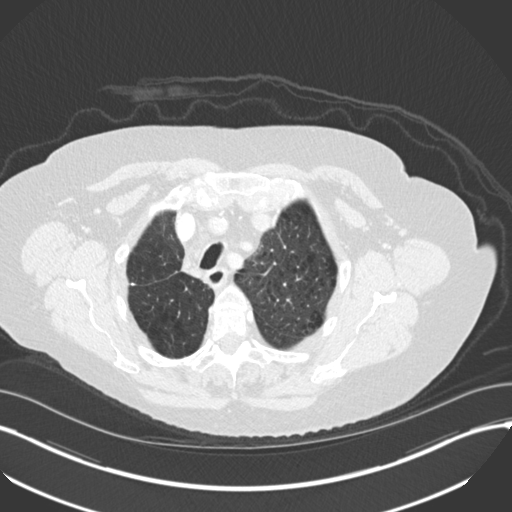
[im 120/140  lung]
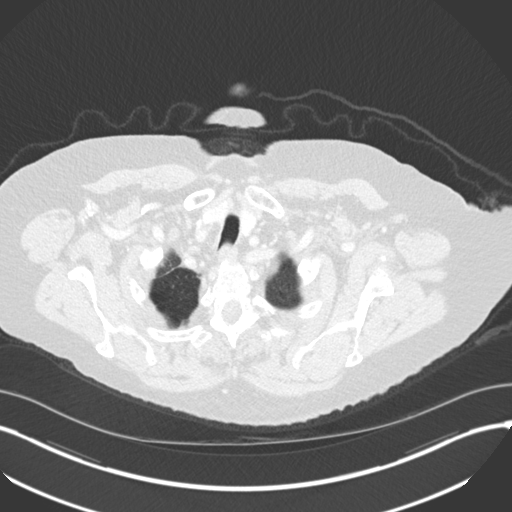
[im 130/140  lung]
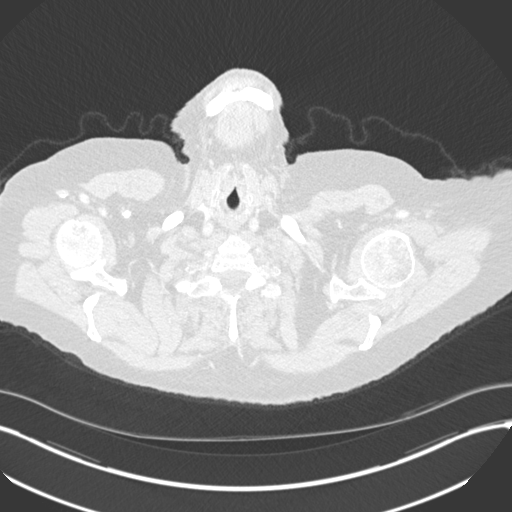

[Series 5: coronal · coronal · 0.56mm/px · 3 of 137 slices shown]
[im 28/137  lung]
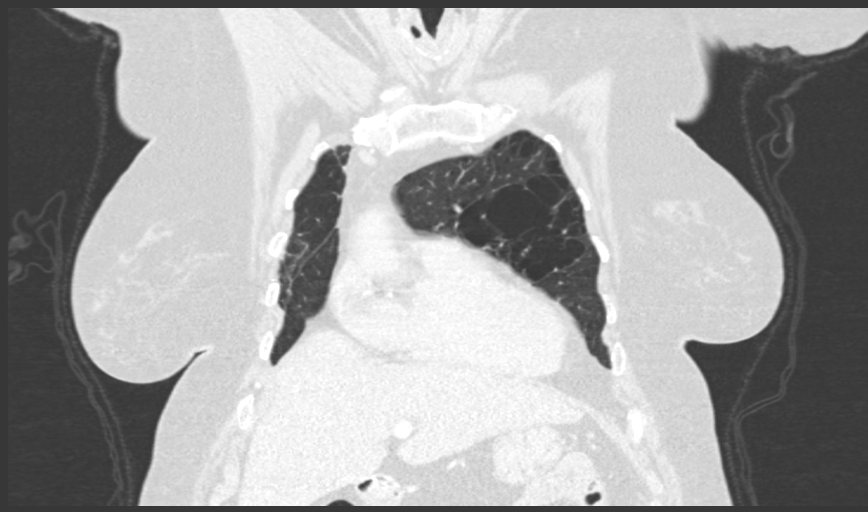
[im 55/137  lung]
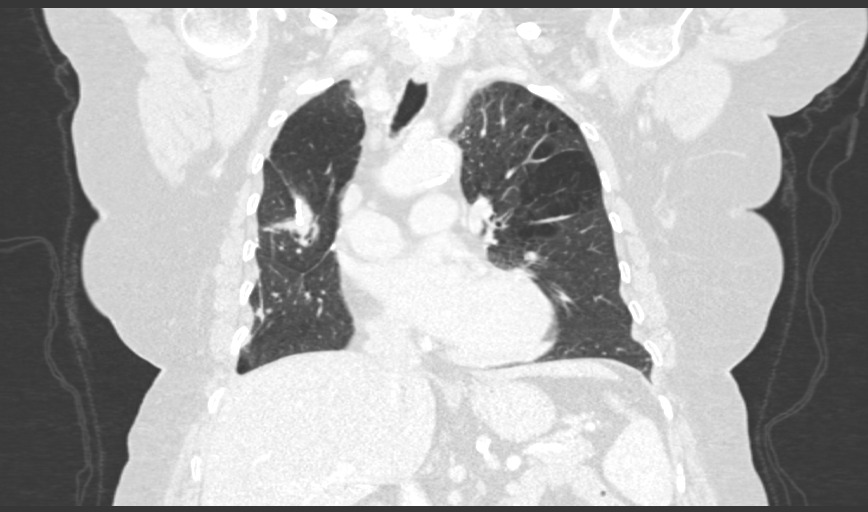
[im 82/137  lung]
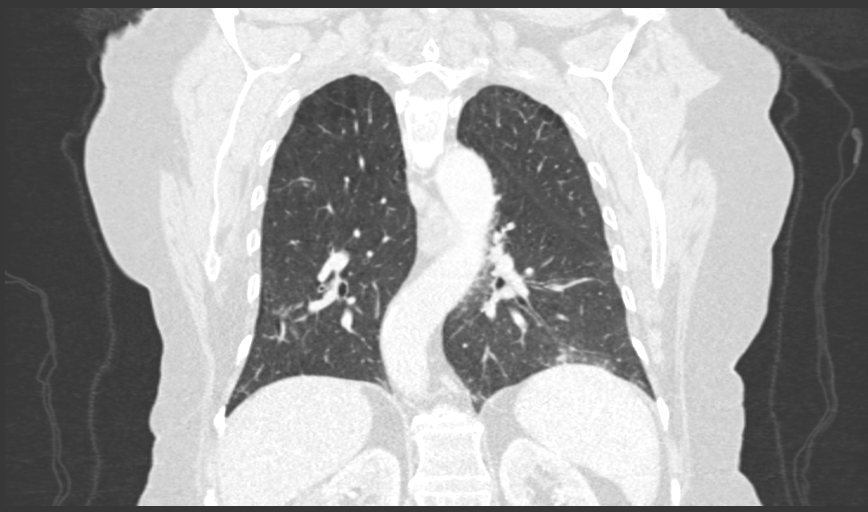

[14 of 36 positions shown; findings below may reference images not displayed]

FINDINGS: Cardiovascular: No acute findings. Stable mild cardiomegaly. Aortic
and coronary artery atherosclerosis.

Mediastinum/Nodes: No masses or pathologically enlarged lymph nodes
identified. Stable small mediastinal lymph nodes in right
paratracheal region measuring up to 8 mm.

Lungs/Pleura: Stable postop changes from right upper lobectomy.
Moderate to moderate emphysema. No suspicious pulmonary nodules or
masses identified. No evidence of pulmonary infiltrate or pleural
effusion.

Upper Abdomen:  Unremarkable.

Musculoskeletal:  No suspicious bone lesions.
IMPRESSION: Stable postop changes in right hemithorax. No evidence of recurrent
or metastatic carcinoma within the thorax.

Aortic Atherosclerosis ([7A]-[7A]) and Emphysema ([7A]-[7A]).
Mild cardiomegaly and coronary artery calcification.

## 2017-08-02 MED ORDER — IOPAMIDOL (ISOVUE-300) INJECTION 61%
INTRAVENOUS | Status: AC
  Filled 2017-08-02: qty 75

## 2017-08-02 MED ORDER — IOPAMIDOL (ISOVUE-300) INJECTION 61%
75.0000 mL | Freq: Once | INTRAVENOUS | Status: AC | PRN
  Administered 2017-08-02: 75 mL via INTRAVENOUS

## 2017-08-03 ENCOUNTER — Encounter: Payer: Self-pay | Admitting: Internal Medicine

## 2017-08-03 ENCOUNTER — Inpatient Hospital Stay: Payer: Medicare Other | Attending: Internal Medicine | Admitting: Internal Medicine

## 2017-08-03 VITALS — BP 143/56 | HR 76 | Temp 97.7°F | Resp 20 | Ht 61.5 in | Wt 167.4 lb

## 2017-08-03 DIAGNOSIS — I10 Essential (primary) hypertension: Secondary | ICD-10-CM | POA: Diagnosis not present

## 2017-08-03 DIAGNOSIS — E785 Hyperlipidemia, unspecified: Secondary | ICD-10-CM | POA: Diagnosis not present

## 2017-08-03 DIAGNOSIS — Z79899 Other long term (current) drug therapy: Secondary | ICD-10-CM | POA: Insufficient documentation

## 2017-08-03 DIAGNOSIS — J449 Chronic obstructive pulmonary disease, unspecified: Secondary | ICD-10-CM | POA: Insufficient documentation

## 2017-08-03 DIAGNOSIS — F329 Major depressive disorder, single episode, unspecified: Secondary | ICD-10-CM | POA: Diagnosis not present

## 2017-08-03 DIAGNOSIS — I700 Atherosclerosis of aorta: Secondary | ICD-10-CM | POA: Diagnosis not present

## 2017-08-03 DIAGNOSIS — M1990 Unspecified osteoarthritis, unspecified site: Secondary | ICD-10-CM | POA: Diagnosis not present

## 2017-08-03 DIAGNOSIS — K219 Gastro-esophageal reflux disease without esophagitis: Secondary | ICD-10-CM | POA: Diagnosis not present

## 2017-08-03 DIAGNOSIS — F419 Anxiety disorder, unspecified: Secondary | ICD-10-CM | POA: Diagnosis not present

## 2017-08-03 DIAGNOSIS — C3411 Malignant neoplasm of upper lobe, right bronchus or lung: Secondary | ICD-10-CM | POA: Insufficient documentation

## 2017-08-03 DIAGNOSIS — C3490 Malignant neoplasm of unspecified part of unspecified bronchus or lung: Secondary | ICD-10-CM
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
## 2017-08-07 ENCOUNTER — Telehealth: Payer: Self-pay | Admitting: Internal Medicine

## 2017-08-07 NOTE — Telephone Encounter (Signed)
Scheduled appt per 1/10 los - Sent reminder letter in the mail with appt date and time .

## 2017-08-29 ENCOUNTER — Ambulatory Visit
Admission: RE | Admit: 2017-08-29 | Discharge: 2017-08-29 | Disposition: A | Discharge status: Home / Self Care | Payer: Medicare Other | Source: Ambulatory Visit | Attending: Internal Medicine | Admitting: Internal Medicine

## 2017-08-29 ENCOUNTER — Other Ambulatory Visit: Payer: Self-pay | Admitting: Internal Medicine

## 2017-08-29 DIAGNOSIS — R1084 Generalized abdominal pain: Secondary | ICD-10-CM

## 2017-08-29 IMAGING — CR DG ABDOMEN 2V
3 series · 3 of 3 positions shown · non-contrast
Comparison: Radiographs [DATE].

CLINICAL DATA: Chronic epigastric abdominal pain.

EXAM:
ABDOMEN - 2 VIEW

[w abdomen upright]
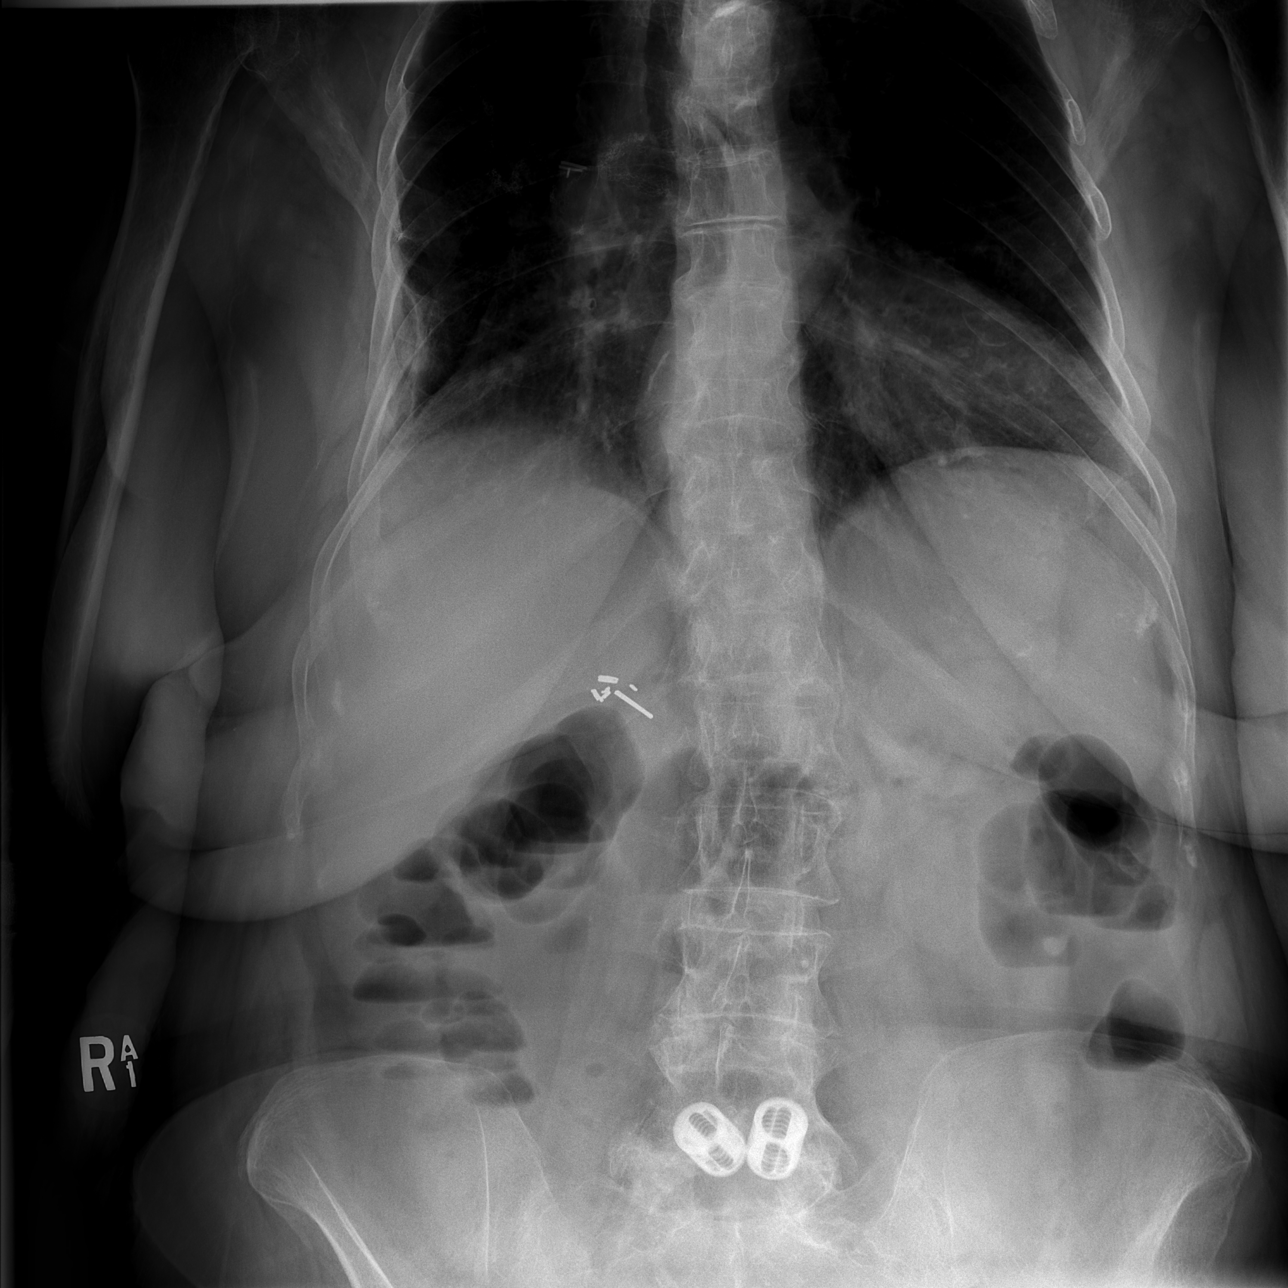

[t abdomen supine (1 of 2)]
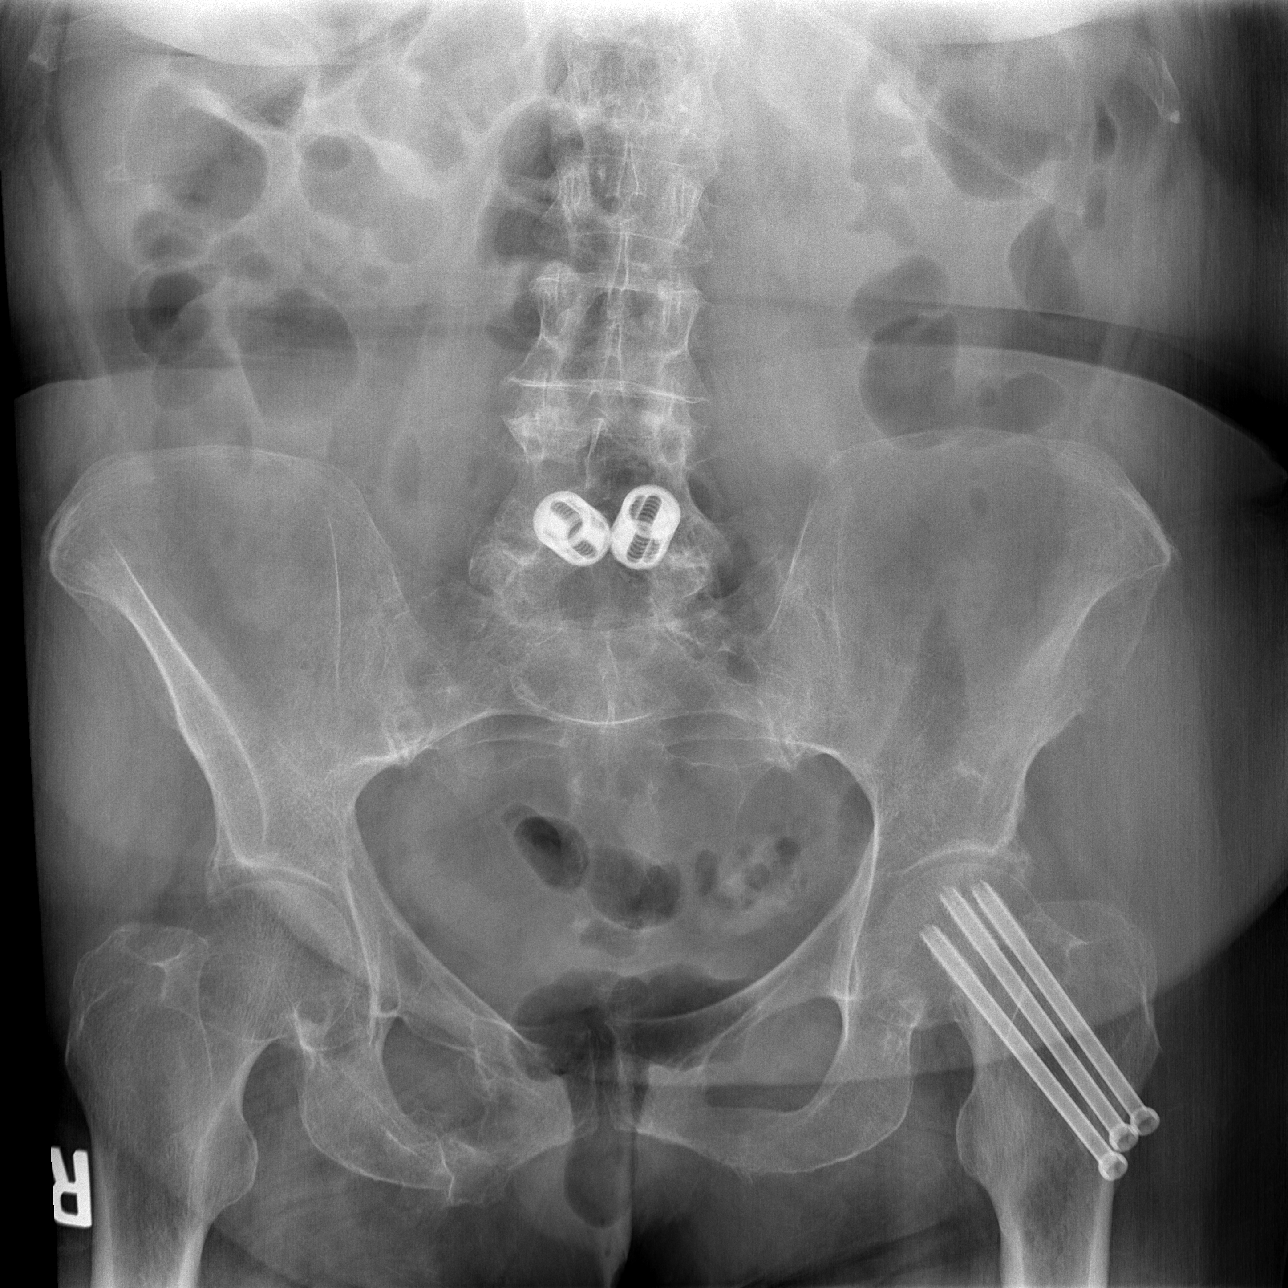

[t abdomen supine (2 of 2)]
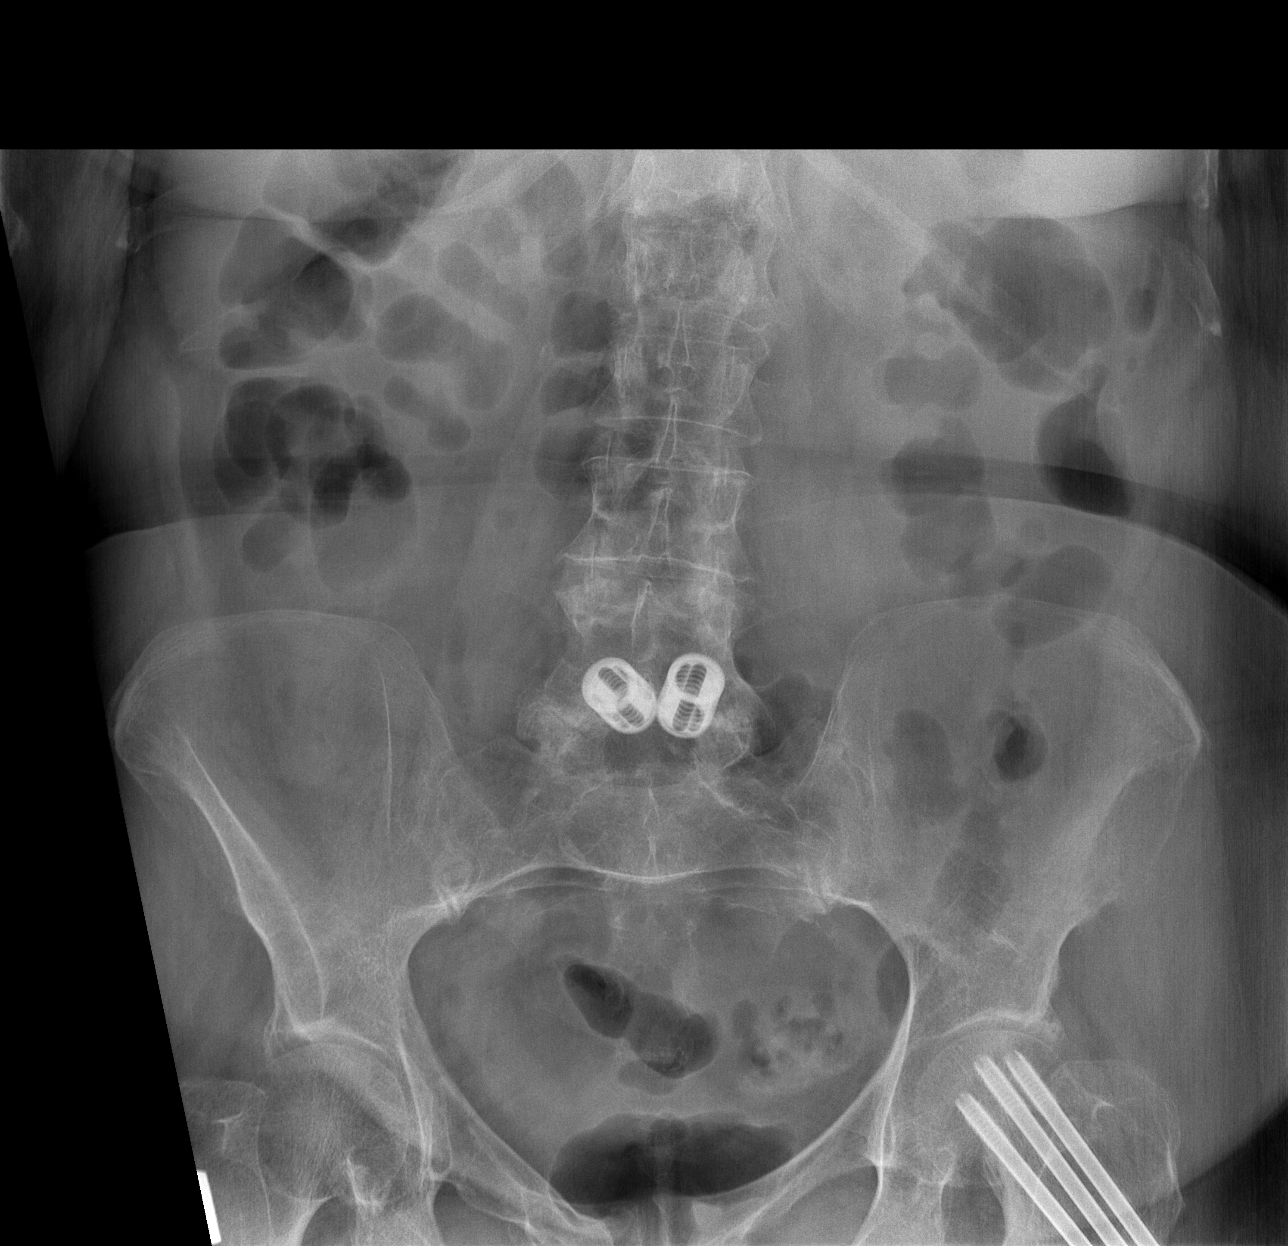

[3 of 3 positions shown; findings below may reference images not displayed]

FINDINGS: The bowel gas pattern is normal. There is no evidence of free air.
Status post cholecystectomy. No radio-opaque calculi or other
significant radiographic abnormality is seen.
IMPRESSION: No evidence of bowel obstruction or ileus.

## 2017-10-09 ENCOUNTER — Other Ambulatory Visit: Payer: Self-pay | Admitting: Internal Medicine

## 2017-10-09 ENCOUNTER — Ambulatory Visit
Admission: RE | Admit: 2017-10-09 | Discharge: 2017-10-09 | Disposition: A | Discharge status: Home / Self Care | Payer: Medicare Other | Source: Ambulatory Visit | Attending: Internal Medicine | Admitting: Internal Medicine

## 2017-10-09 DIAGNOSIS — M25439 Effusion, unspecified wrist: Secondary | ICD-10-CM

## 2017-10-09 IMAGING — DX DG FOREARM 2V*L*
2 series · 2 of 2 positions shown · non-contrast
Comparison: None.

CLINICAL DATA: Pain and swelling of the distal left forearm.

EXAM:
LEFT FOREARM - 2 VIEW

[dg forearm right (1 of 2)]
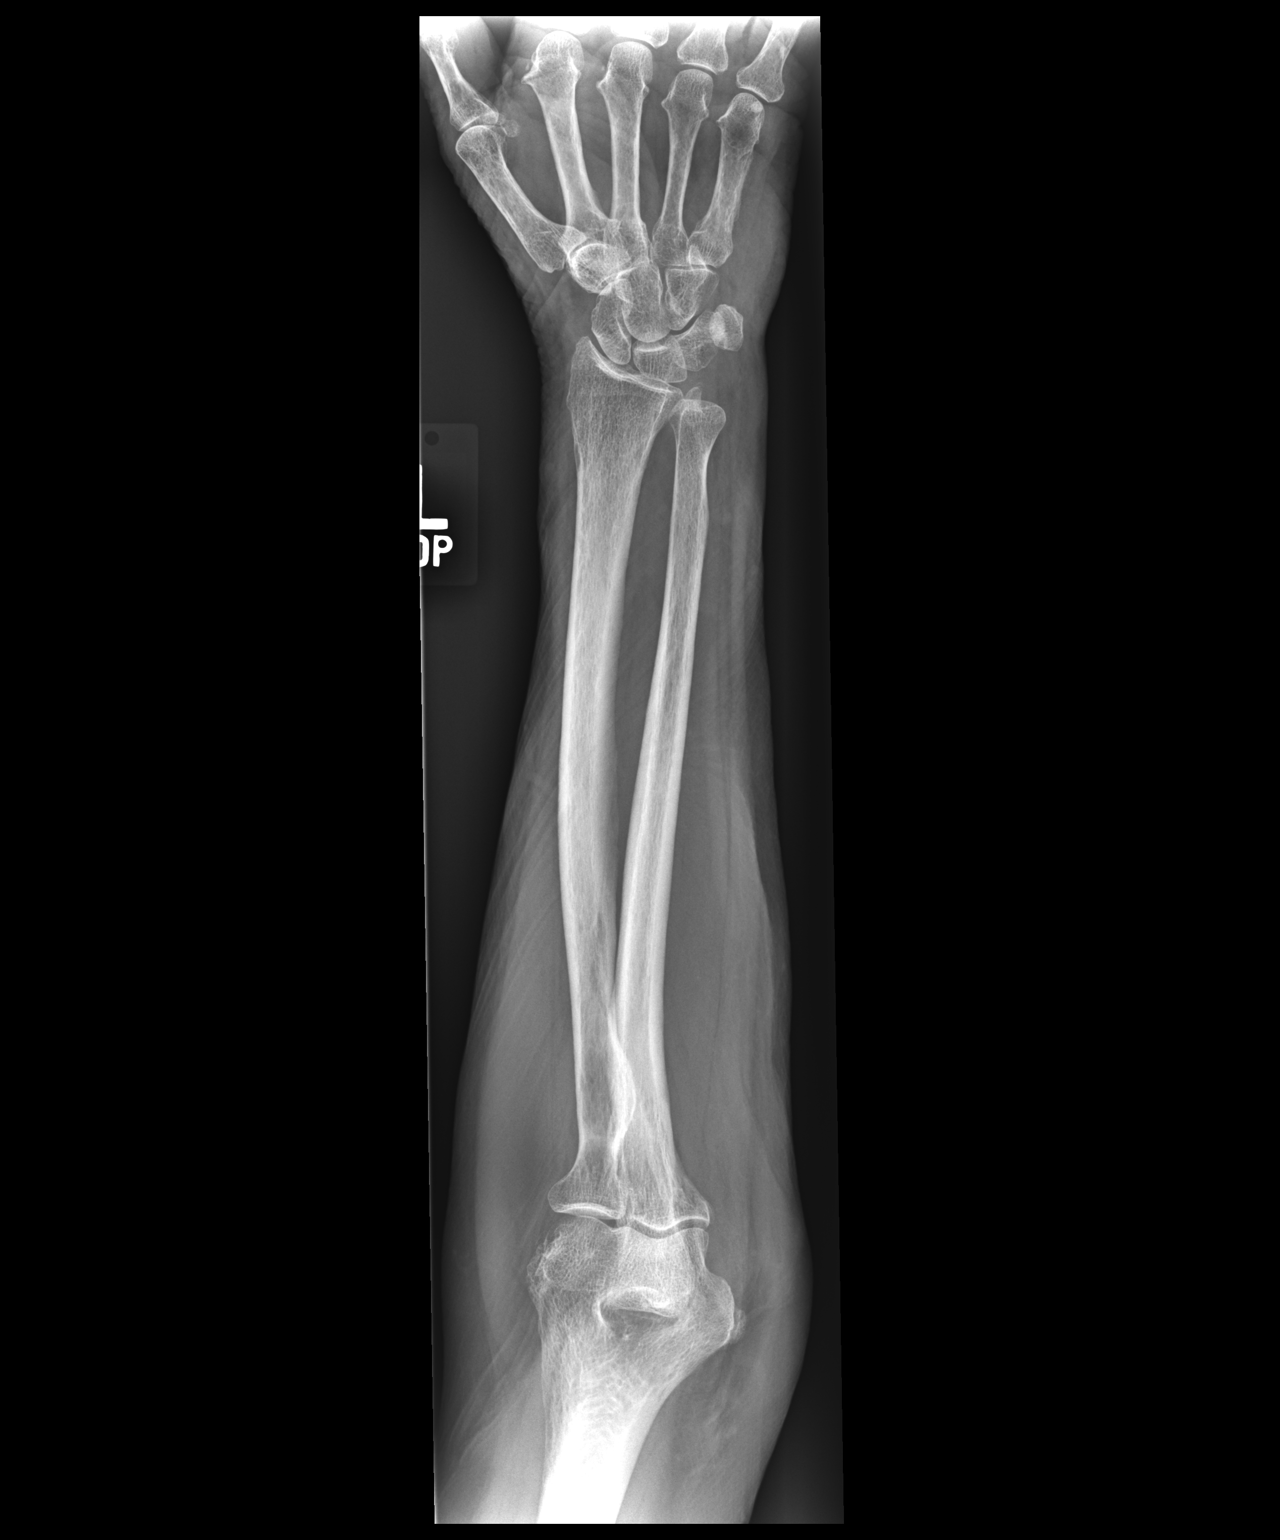

[dg forearm right (2 of 2)]
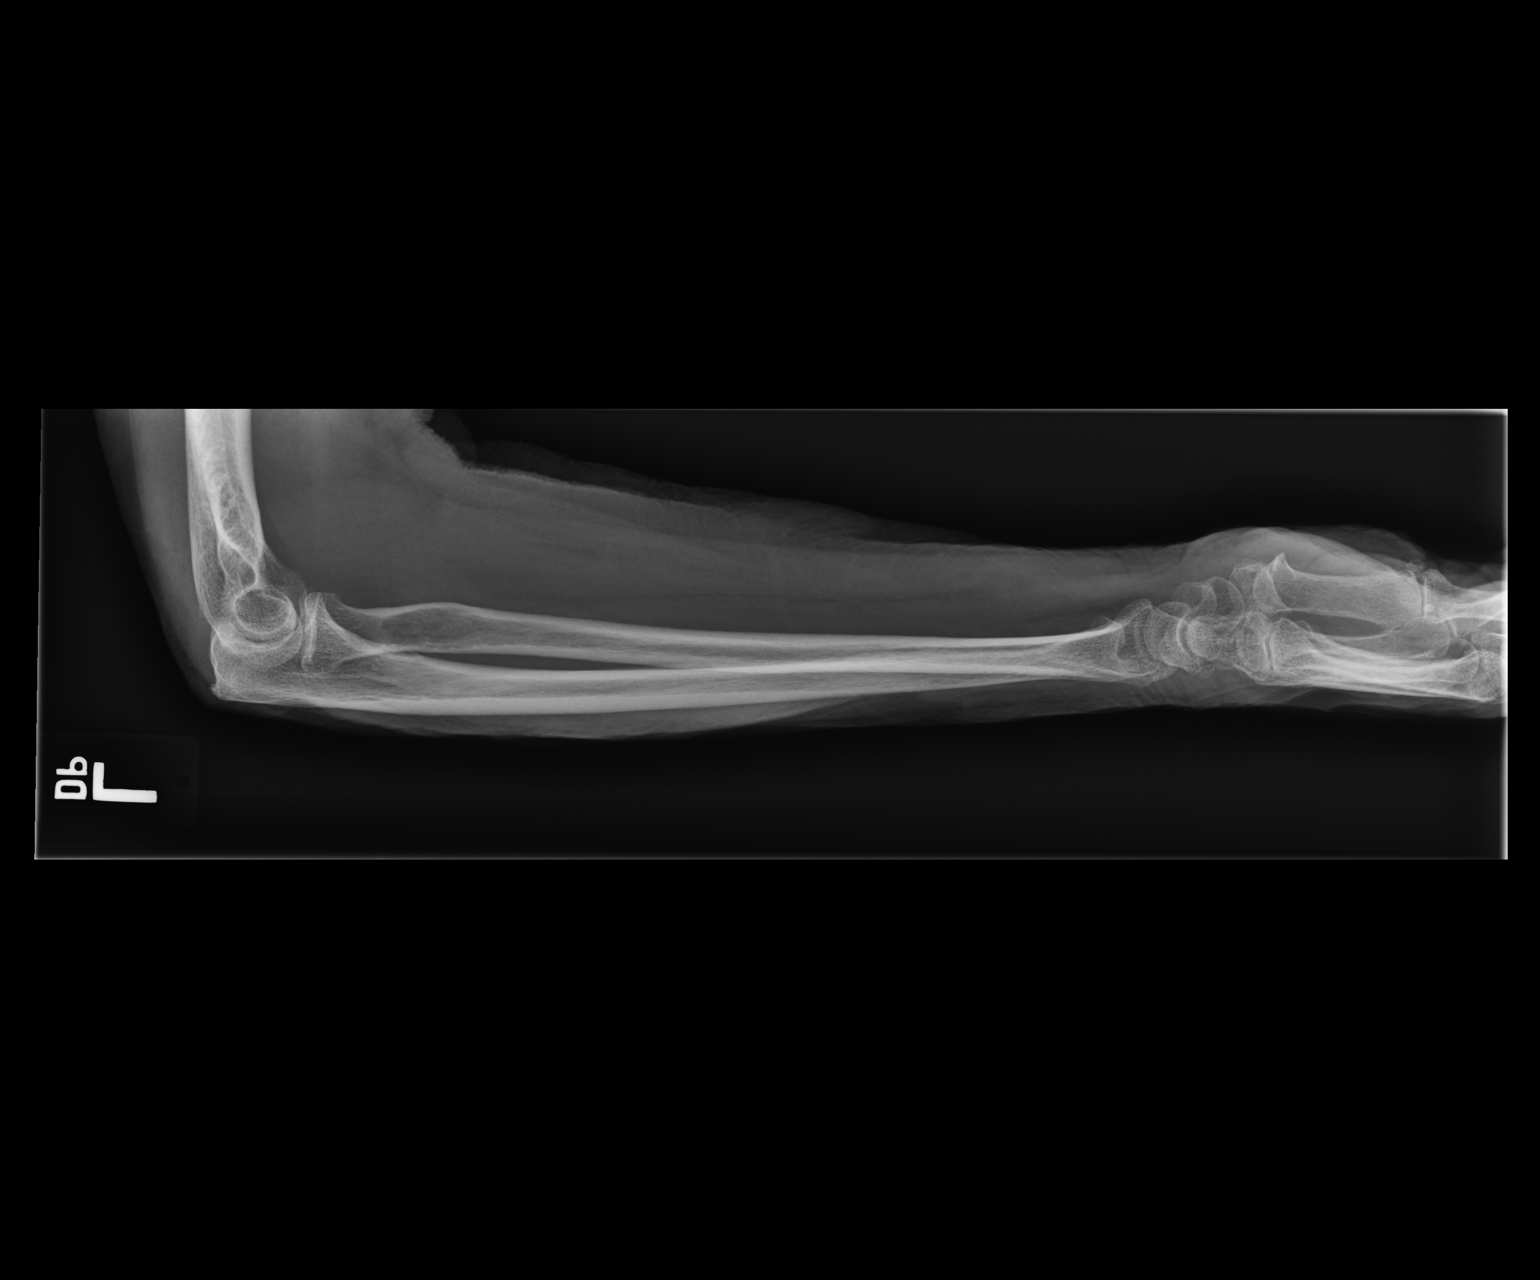

[2 of 2 positions shown; findings below may reference images not displayed]

FINDINGS: There is no fracture dislocation or appreciable joint effusion.
Slight degenerative changes of the medial and lateral epicondyles of
the distal left humerus. Slight enthesophyte formation at the
triceps insertion on the olecranon process of the proximal ulna.
IMPRESSION: No significant abnormality of the left forearm.

## 2017-10-16 ENCOUNTER — Other Ambulatory Visit: Payer: Self-pay | Admitting: Internal Medicine

## 2017-10-16 ENCOUNTER — Ambulatory Visit
Admission: RE | Admit: 2017-10-16 | Discharge: 2017-10-16 | Disposition: A | Discharge status: Home / Self Care | Payer: Medicare Other | Source: Ambulatory Visit | Attending: Internal Medicine | Admitting: Internal Medicine

## 2017-10-16 DIAGNOSIS — M25532 Pain in left wrist: Secondary | ICD-10-CM

## 2017-10-16 IMAGING — CR DG WRIST COMPLETE 3+V*L*
4 series · 4 of 4 positions shown · non-contrast
Comparison: [DATE]

CLINICAL DATA: Wrist pain following fall 2 weeks ago, subsequent
encounter

EXAM:
LEFT WRIST - COMPLETE 3+ VIEW

[x wrist pa left]
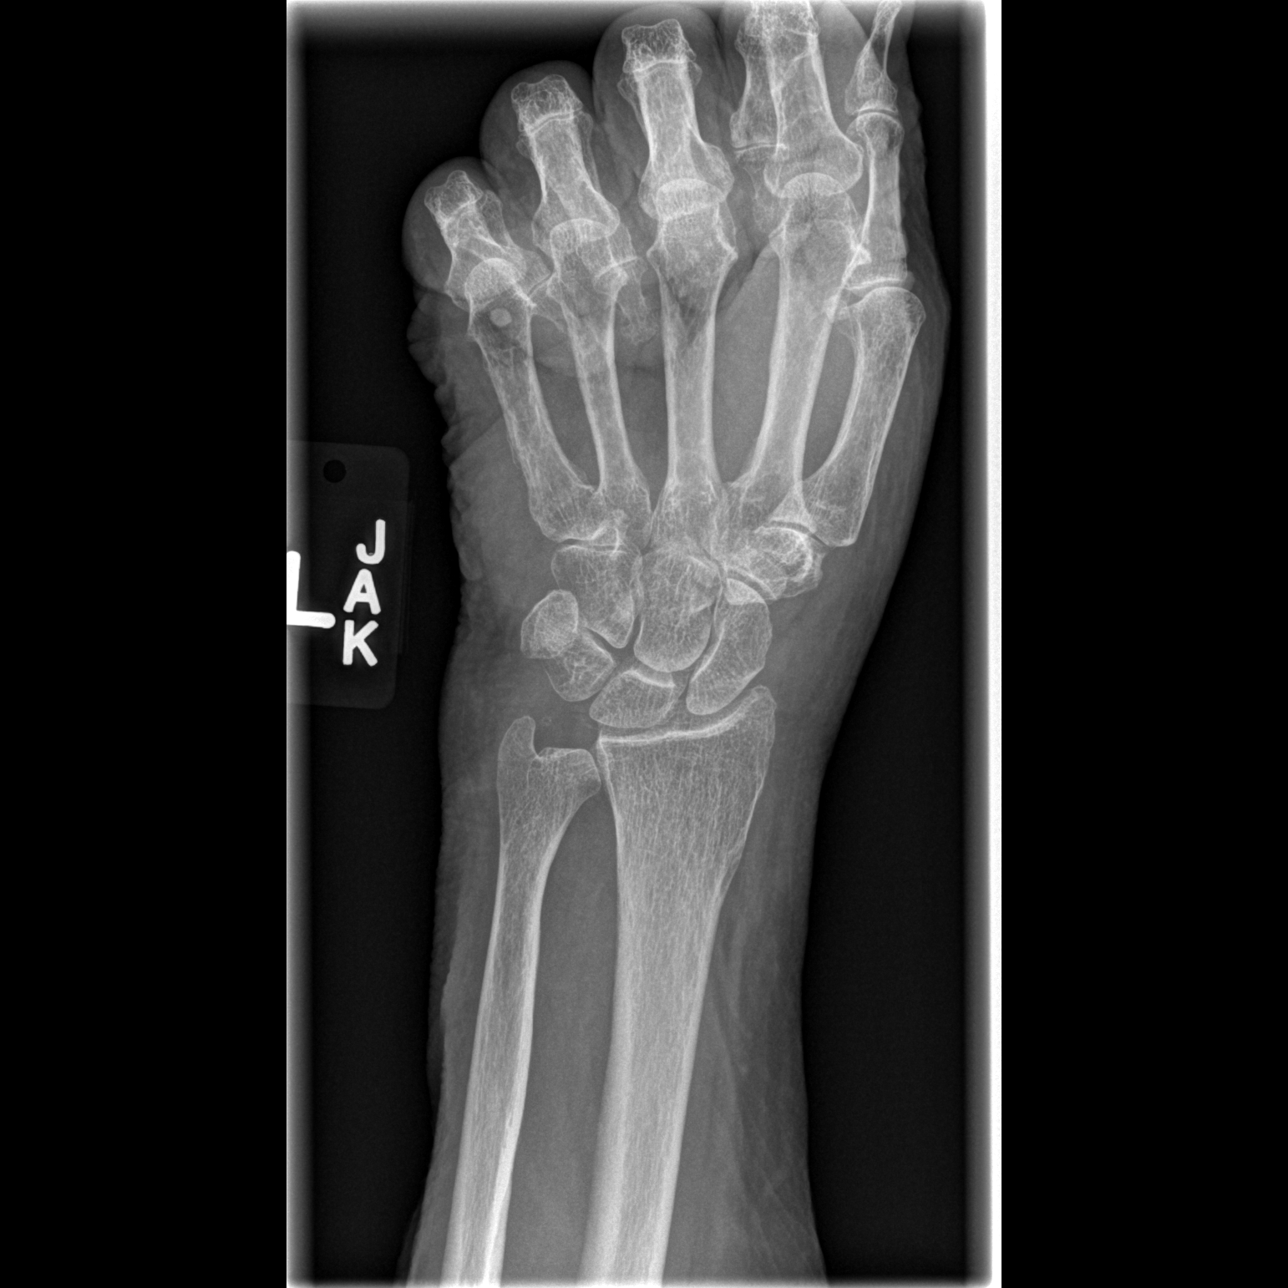

[x wrist obl left]
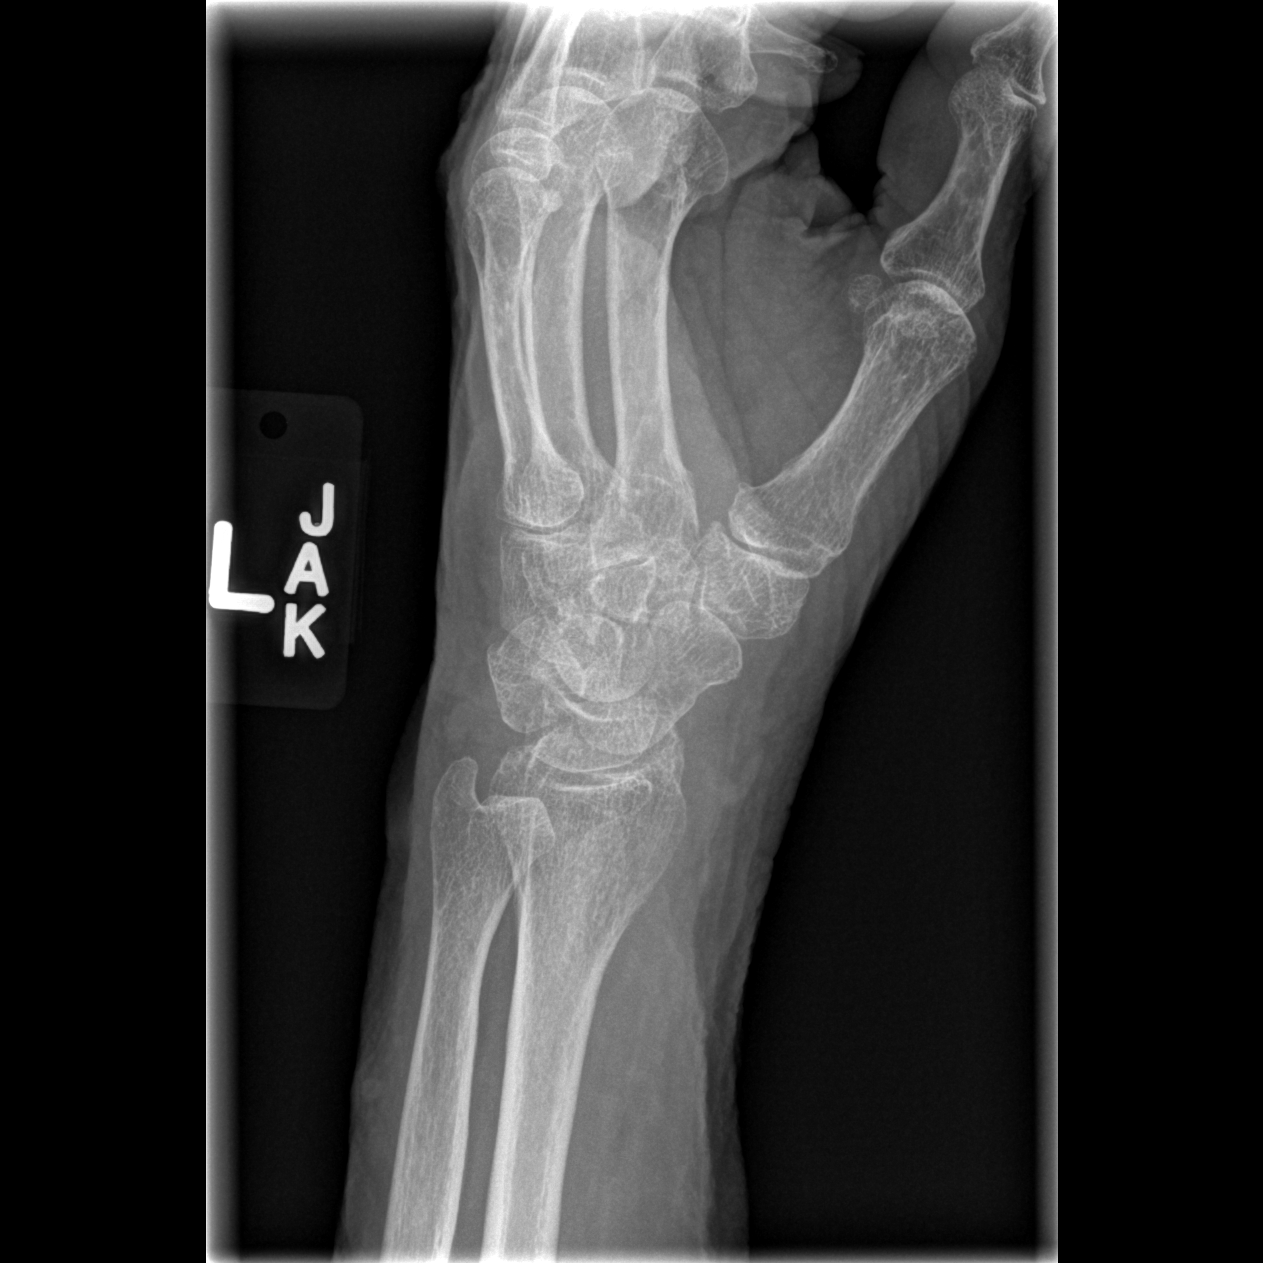

[x wrist lat left]
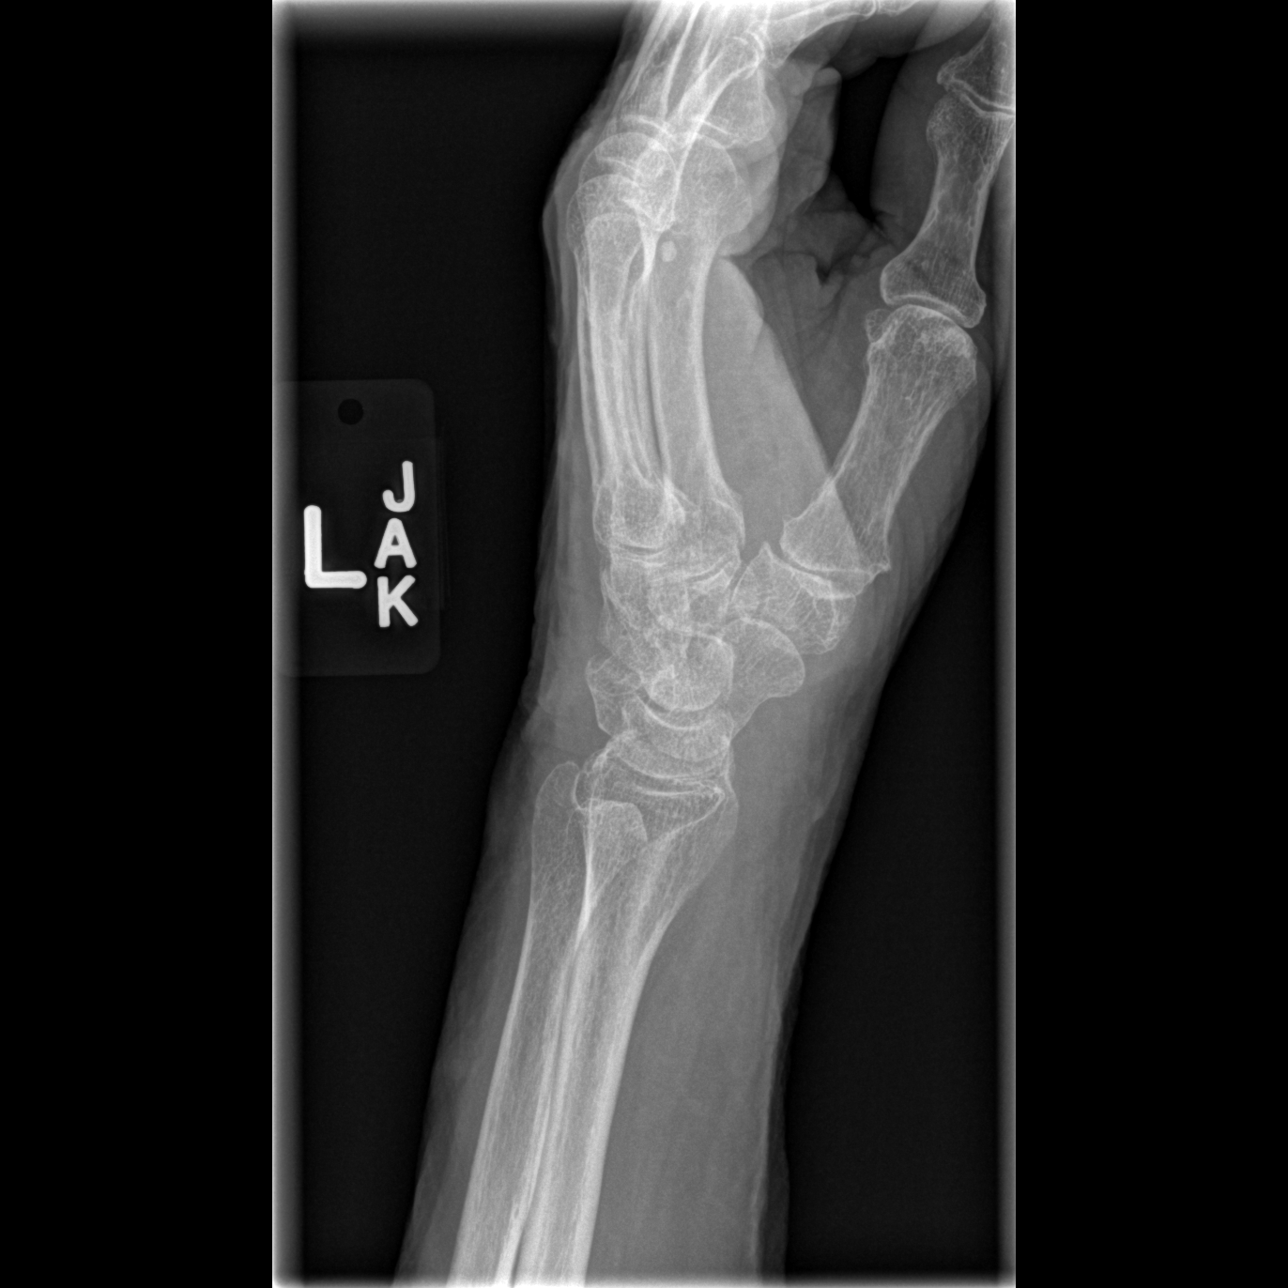

[x navicular]
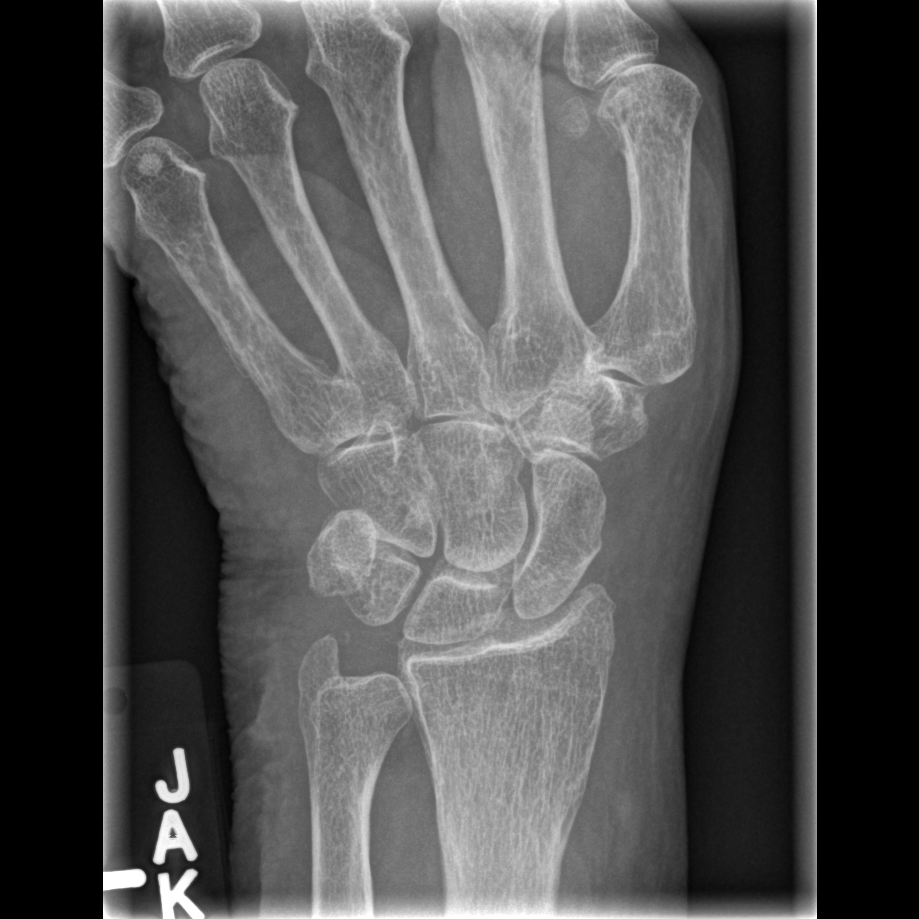

[4 of 4 positions shown; findings below may reference images not displayed]

FINDINGS: Mild degenerative changes are noted at the first CMC joint. No acute
fracture or dislocation is seen. No soft tissue changes are noted.
IMPRESSION: No acute abnormality noted.

## 2017-10-30 ENCOUNTER — Ambulatory Visit (INDEPENDENT_AMBULATORY_CARE_PROVIDER_SITE_OTHER): Payer: Self-pay | Admitting: Orthopaedic Surgery

## 2017-11-06 ENCOUNTER — Ambulatory Visit (INDEPENDENT_AMBULATORY_CARE_PROVIDER_SITE_OTHER): Payer: Medicare Other | Admitting: Orthopaedic Surgery

## 2017-11-06 ENCOUNTER — Encounter (INDEPENDENT_AMBULATORY_CARE_PROVIDER_SITE_OTHER): Payer: Self-pay | Admitting: Orthopaedic Surgery

## 2017-11-06 DIAGNOSIS — M19032 Primary osteoarthritis, left wrist: Secondary | ICD-10-CM

## 2017-11-06 MED ORDER — PREDNISONE 10 MG (21) PO TBPK: ORAL_TABLET | ORAL | 0 refills | Status: DC

## 2017-11-06 MED ORDER — MELOXICAM 7.5 MG PO TABS: 7.5000 mg | ORAL_TABLET | Freq: Every day | ORAL | 1 refills | Status: DC | PRN
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
## 2017-12-08 ENCOUNTER — Encounter (HOSPITAL_COMMUNITY): Payer: Self-pay | Admitting: Emergency Medicine

## 2017-12-08 ENCOUNTER — Inpatient Hospital Stay (HOSPITAL_COMMUNITY)
Admission: EM | Admit: 2017-12-08 | Discharge: 2017-12-12 | DRG: 286 | Disposition: A | Discharge status: Home / Self Care | Payer: Medicare Other | Attending: Internal Medicine | Admitting: Internal Medicine

## 2017-12-08 ENCOUNTER — Other Ambulatory Visit: Payer: Self-pay

## 2017-12-08 ENCOUNTER — Emergency Department (HOSPITAL_COMMUNITY): Payer: Medicare Other

## 2017-12-08 ENCOUNTER — Inpatient Hospital Stay (HOSPITAL_COMMUNITY): Payer: Medicare Other

## 2017-12-08 DIAGNOSIS — I214 Non-ST elevation (NSTEMI) myocardial infarction: Secondary | ICD-10-CM | POA: Diagnosis not present

## 2017-12-08 DIAGNOSIS — I255 Ischemic cardiomyopathy: Secondary | ICD-10-CM | POA: Diagnosis present

## 2017-12-08 DIAGNOSIS — I130 Hypertensive heart and chronic kidney disease with heart failure and stage 1 through stage 4 chronic kidney disease, or unspecified chronic kidney disease: Principal | ICD-10-CM | POA: Diagnosis present

## 2017-12-08 DIAGNOSIS — Z1839 Other retained organic fragments: Secondary | ICD-10-CM

## 2017-12-08 DIAGNOSIS — Z902 Acquired absence of lung [part of]: Secondary | ICD-10-CM | POA: Diagnosis not present

## 2017-12-08 DIAGNOSIS — I5181 Takotsubo syndrome: Secondary | ICD-10-CM | POA: Diagnosis present

## 2017-12-08 DIAGNOSIS — Z8542 Personal history of malignant neoplasm of other parts of uterus: Secondary | ICD-10-CM | POA: Diagnosis not present

## 2017-12-08 DIAGNOSIS — Z79899 Other long term (current) drug therapy: Secondary | ICD-10-CM | POA: Diagnosis not present

## 2017-12-08 DIAGNOSIS — N183 Chronic kidney disease, stage 3 unspecified: Secondary | ICD-10-CM | POA: Diagnosis present

## 2017-12-08 DIAGNOSIS — I5021 Acute systolic (congestive) heart failure: Secondary | ICD-10-CM | POA: Diagnosis present

## 2017-12-08 DIAGNOSIS — C3491 Malignant neoplasm of unspecified part of right bronchus or lung: Secondary | ICD-10-CM | POA: Diagnosis not present

## 2017-12-08 DIAGNOSIS — S20469A Insect bite (nonvenomous) of unspecified back wall of thorax, initial encounter: Secondary | ICD-10-CM | POA: Diagnosis present

## 2017-12-08 DIAGNOSIS — G894 Chronic pain syndrome: Secondary | ICD-10-CM | POA: Diagnosis present

## 2017-12-08 DIAGNOSIS — J441 Chronic obstructive pulmonary disease with (acute) exacerbation: Secondary | ICD-10-CM | POA: Diagnosis present

## 2017-12-08 DIAGNOSIS — R0602 Shortness of breath: Secondary | ICD-10-CM | POA: Diagnosis not present

## 2017-12-08 DIAGNOSIS — I10 Essential (primary) hypertension: Secondary | ICD-10-CM | POA: Diagnosis not present

## 2017-12-08 DIAGNOSIS — Z86718 Personal history of other venous thrombosis and embolism: Secondary | ICD-10-CM

## 2017-12-08 DIAGNOSIS — F411 Generalized anxiety disorder: Secondary | ICD-10-CM | POA: Diagnosis present

## 2017-12-08 DIAGNOSIS — I2510 Atherosclerotic heart disease of native coronary artery without angina pectoris: Secondary | ICD-10-CM | POA: Diagnosis present

## 2017-12-08 DIAGNOSIS — Z7951 Long term (current) use of inhaled steroids: Secondary | ICD-10-CM | POA: Diagnosis not present

## 2017-12-08 DIAGNOSIS — Z85118 Personal history of other malignant neoplasm of bronchus and lung: Secondary | ICD-10-CM

## 2017-12-08 DIAGNOSIS — G8922 Chronic post-thoracotomy pain: Secondary | ICD-10-CM | POA: Diagnosis present

## 2017-12-08 DIAGNOSIS — R7989 Other specified abnormal findings of blood chemistry: Secondary | ICD-10-CM | POA: Diagnosis present

## 2017-12-08 DIAGNOSIS — S20459A Superficial foreign body of unspecified back wall of thorax, initial encounter: Secondary | ICD-10-CM

## 2017-12-08 DIAGNOSIS — M25551 Pain in right hip: Secondary | ICD-10-CM | POA: Diagnosis not present

## 2017-12-08 DIAGNOSIS — K219 Gastro-esophageal reflux disease without esophagitis: Secondary | ICD-10-CM

## 2017-12-08 DIAGNOSIS — R748 Abnormal levels of other serum enzymes: Secondary | ICD-10-CM | POA: Diagnosis not present

## 2017-12-08 DIAGNOSIS — R778 Other specified abnormalities of plasma proteins: Secondary | ICD-10-CM | POA: Diagnosis present

## 2017-12-08 DIAGNOSIS — W57XXXA Bitten or stung by nonvenomous insect and other nonvenomous arthropods, initial encounter: Secondary | ICD-10-CM | POA: Diagnosis present

## 2017-12-08 DIAGNOSIS — M792 Neuralgia and neuritis, unspecified: Secondary | ICD-10-CM | POA: Diagnosis present

## 2017-12-08 DIAGNOSIS — E7800 Pure hypercholesterolemia, unspecified: Secondary | ICD-10-CM | POA: Diagnosis present

## 2017-12-08 DIAGNOSIS — M533 Sacrococcygeal disorders, not elsewhere classified: Secondary | ICD-10-CM

## 2017-12-08 DIAGNOSIS — M25552 Pain in left hip: Secondary | ICD-10-CM | POA: Diagnosis not present

## 2017-12-08 DIAGNOSIS — G8929 Other chronic pain: Secondary | ICD-10-CM | POA: Diagnosis not present

## 2017-12-08 DIAGNOSIS — Z87891 Personal history of nicotine dependence: Secondary | ICD-10-CM

## 2017-12-08 DIAGNOSIS — M810 Age-related osteoporosis without current pathological fracture: Secondary | ICD-10-CM | POA: Diagnosis present

## 2017-12-08 DIAGNOSIS — R0789 Other chest pain: Secondary | ICD-10-CM | POA: Diagnosis not present

## 2017-12-08 DIAGNOSIS — R072 Precordial pain: Secondary | ICD-10-CM | POA: Diagnosis present

## 2017-12-08 DIAGNOSIS — E785 Hyperlipidemia, unspecified: Secondary | ICD-10-CM | POA: Diagnosis present

## 2017-12-08 LAB — CBC
HCT: 37.5 % (ref 36.0–46.0)
Hemoglobin: 11.7 g/dL — ABNORMAL LOW (ref 12.0–15.0)
MCH: 27.4 pg (ref 26.0–34.0)
MCHC: 31.2 g/dL (ref 30.0–36.0)
MCV: 87.8 fL (ref 78.0–100.0)
Platelets: 296 10*3/uL (ref 150–400)
RBC: 4.27 MIL/uL (ref 3.87–5.11)
RDW: 14.8 % (ref 11.5–15.5)
WBC: 6.7 10*3/uL (ref 4.0–10.5)

## 2017-12-08 LAB — CREATININE, SERUM
Creatinine, Ser: 1.38 mg/dL — ABNORMAL HIGH (ref 0.44–1.00)
GFR calc Af Amer: 42 mL/min — ABNORMAL LOW (ref >60)
GFR calc non Af Amer: 37 mL/min — ABNORMAL LOW (ref >60)

## 2017-12-08 LAB — COMPREHENSIVE METABOLIC PANEL
ALT: 11 U/L — ABNORMAL LOW (ref 14–54)
AST: 21 U/L (ref 15–41)
Albumin: 3.1 g/dL — ABNORMAL LOW (ref 3.5–5.0)
Alkaline Phosphatase: 58 U/L (ref 38–126)
Anion gap: 10 (ref 5–15)
BUN: 11 mg/dL (ref 6–20)
CO2: 25 mmol/L (ref 22–32)
Calcium: 8.4 mg/dL — ABNORMAL LOW (ref 8.9–10.3)
Chloride: 104 mmol/L (ref 101–111)
Creatinine, Ser: 1.31 mg/dL — ABNORMAL HIGH (ref 0.44–1.00)
GFR calc Af Amer: 45 mL/min — ABNORMAL LOW (ref >60)
GFR calc non Af Amer: 39 mL/min — ABNORMAL LOW (ref >60)
Glucose, Bld: 137 mg/dL — ABNORMAL HIGH (ref 65–99)
Potassium: 3.9 mmol/L (ref 3.5–5.1)
Sodium: 139 mmol/L (ref 135–145)
Total Bilirubin: 0.4 mg/dL (ref 0.3–1.2)
Total Protein: 6.6 g/dL (ref 6.5–8.1)

## 2017-12-08 LAB — CBC WITH DIFFERENTIAL/PLATELET
Abs Immature Granulocytes: 0 10*3/uL (ref 0.0–0.1)
Basophils Absolute: 0.1 10*3/uL (ref 0.0–0.1)
Basophils Relative: 1 %
Eosinophils Absolute: 0.4 10*3/uL (ref 0.0–0.7)
Eosinophils Relative: 5 %
HCT: 38.3 % (ref 36.0–46.0)
Hemoglobin: 12.2 g/dL (ref 12.0–15.0)
Immature Granulocytes: 0 %
Lymphocytes Relative: 29 %
Lymphs Abs: 2.5 10*3/uL (ref 0.7–4.0)
MCH: 28.2 pg (ref 26.0–34.0)
MCHC: 31.9 g/dL (ref 30.0–36.0)
MCV: 88.5 fL (ref 78.0–100.0)
Monocytes Absolute: 0.5 10*3/uL (ref 0.1–1.0)
Monocytes Relative: 6 %
Neutro Abs: 5.1 10*3/uL (ref 1.7–7.7)
Neutrophils Relative %: 59 %
Platelets: 275 10*3/uL (ref 150–400)
RBC: 4.33 MIL/uL (ref 3.87–5.11)
RDW: 14.8 % (ref 11.5–15.5)
WBC: 8.7 10*3/uL (ref 4.0–10.5)

## 2017-12-08 LAB — URINALYSIS, COMPLETE (UACMP) WITH MICROSCOPIC
Bilirubin Urine: NEGATIVE
Glucose, UA: NEGATIVE mg/dL
Ketones, ur: NEGATIVE mg/dL
Nitrite: POSITIVE — AB
Protein, ur: NEGATIVE mg/dL
Specific Gravity, Urine: 1.01 (ref 1.005–1.030)
pH: 5 (ref 5.0–8.0)

## 2017-12-08 LAB — BRAIN NATRIURETIC PEPTIDE: B Natriuretic Peptide: 116.2 pg/mL — ABNORMAL HIGH (ref 0.0–100.0)

## 2017-12-08 LAB — TROPONIN I
Troponin I: 0.06 ng/mL (ref <0.03)
Troponin I: 0.38 ng/mL (ref <0.03)
Troponin I: 0.33 ng/mL (ref <0.03)
Troponin I: 0.23 ng/mL (ref <0.03)

## 2017-12-08 IMAGING — CR DG CHEST 2V
2 series · 2 of 2 positions shown · non-contrast
Comparison: Chest radiograph performed [DATE], and CT of the
chest performed [DATE]

CLINICAL DATA: Acute onset of shortness of breath and chronic
generalized chest pain.

EXAM:
CHEST - 2 VIEW

[chest lat]
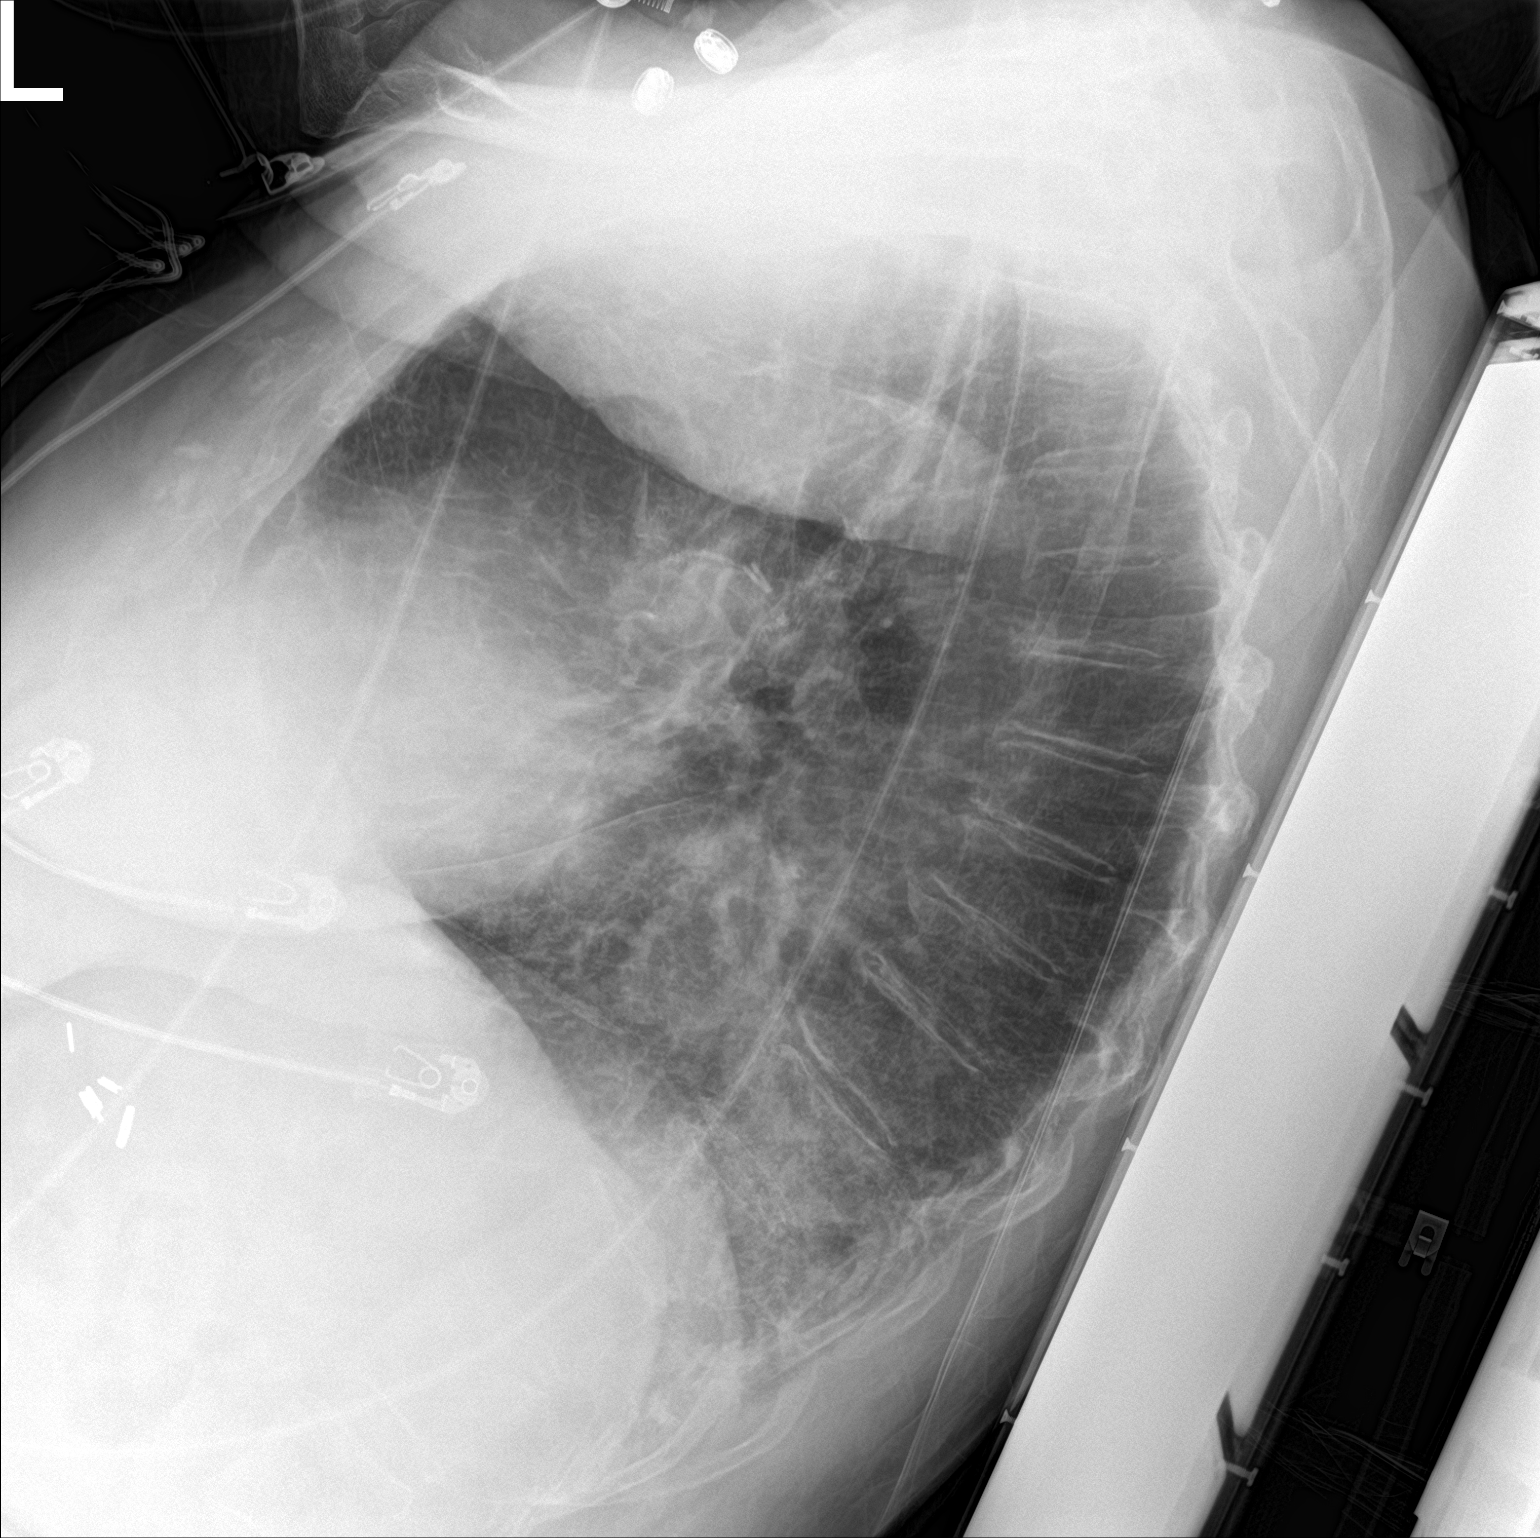

[chest ap]
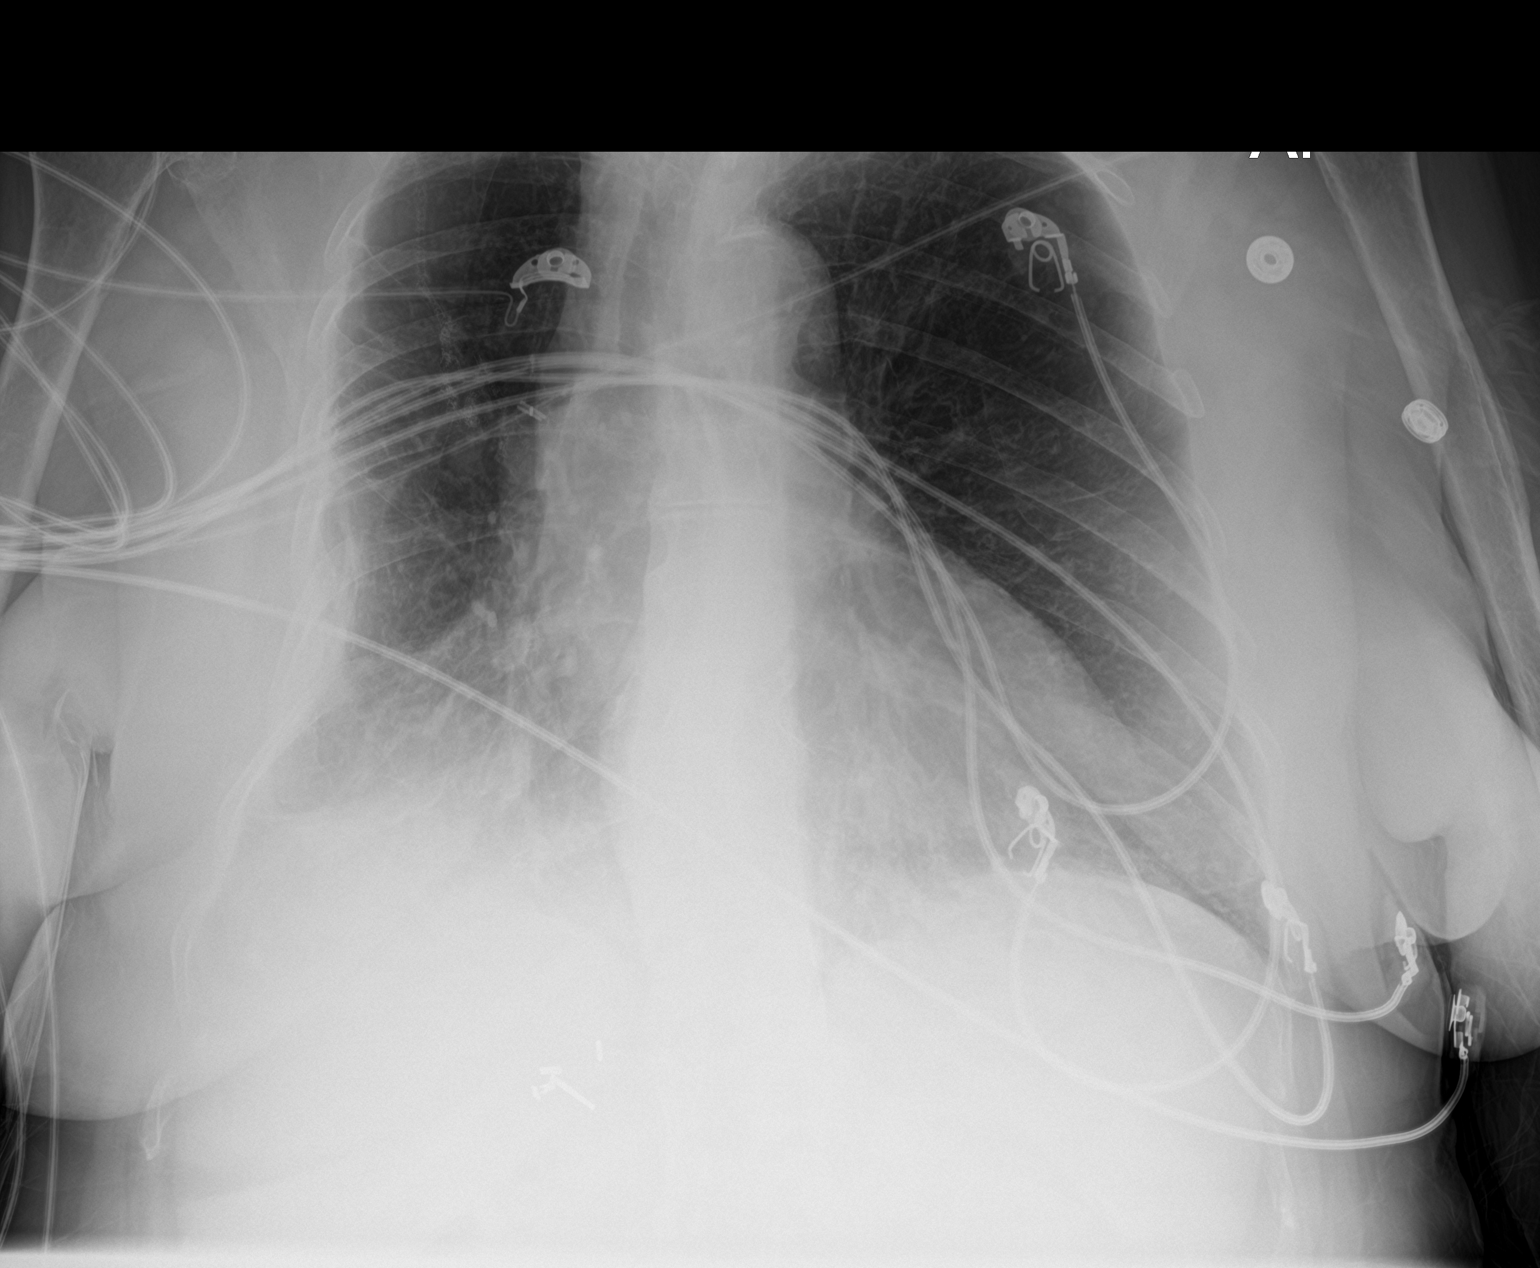

[2 of 2 positions shown; findings below may reference images not displayed]

FINDINGS: The lungs are well-aerated. Minimal scarring is noted at the right
lung base. There is no evidence of pleural effusion or pneumothorax.

The heart is borderline normal in size. No acute osseous
abnormalities are seen. Clips are noted at the right upper quadrant.
IMPRESSION: Minimal scarring at the right lung base.  Lungs otherwise clear.

## 2017-12-08 IMAGING — CT CT CHEST W/O CM
2 of 3 series · 15 of 36 positions shown, 18 images · non-contrast
Comparison: [DATE]

CLINICAL DATA: Acute respiratory illness. COPD exacerbation,
complicated

EXAM:
CT CHEST WITHOUT CONTRAST
TECHNIQUE: Multidetector CT imaging of the chest was performed following the
standard protocol without IV contrast.

[Series 4: thorax 2.0 · axial · 0.68mm/px · z∈[+1080,+1294]mm · 12 of 127 slices shown, 15 images]
[im 10/127  mediastinal]
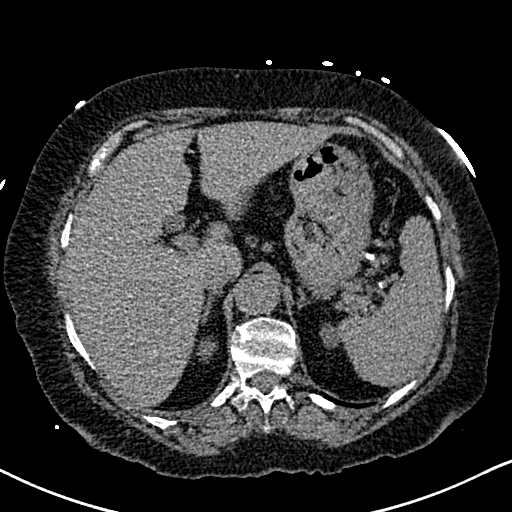
[im 10/127  lung]
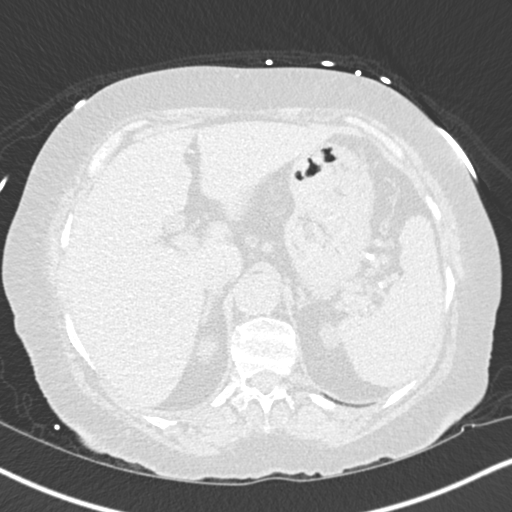
[im 19/127  lung]
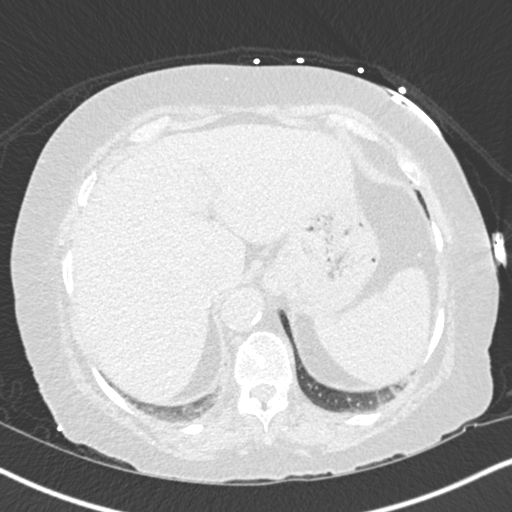
[im 29/127  lung]
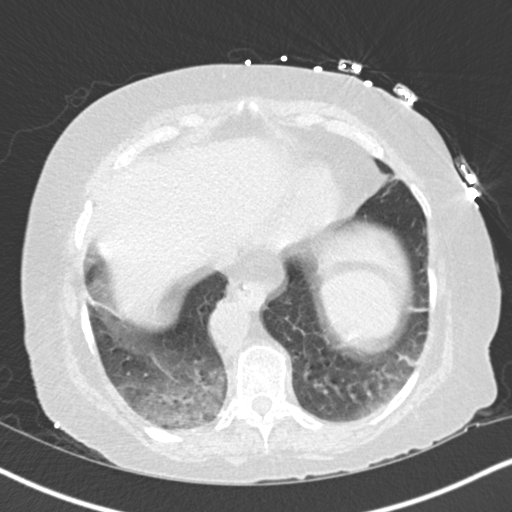
[im 38/127  lung]
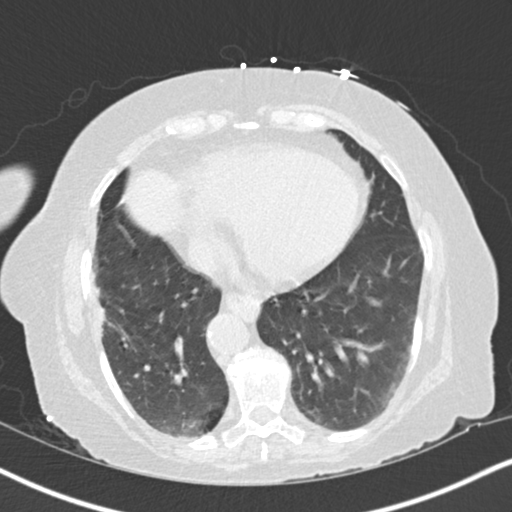
[im 47/127  mediastinal]
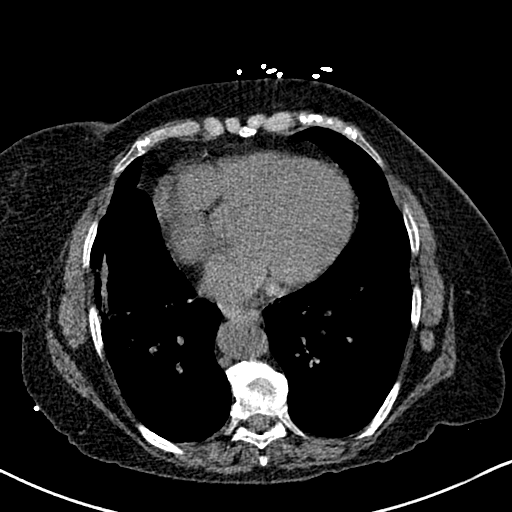
[im 47/127  lung]
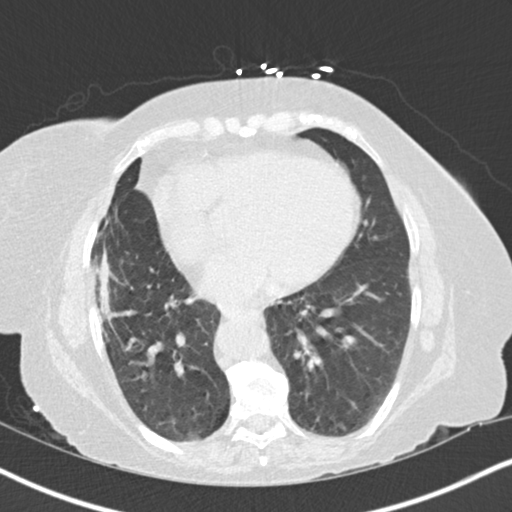
[im 57/127  lung]
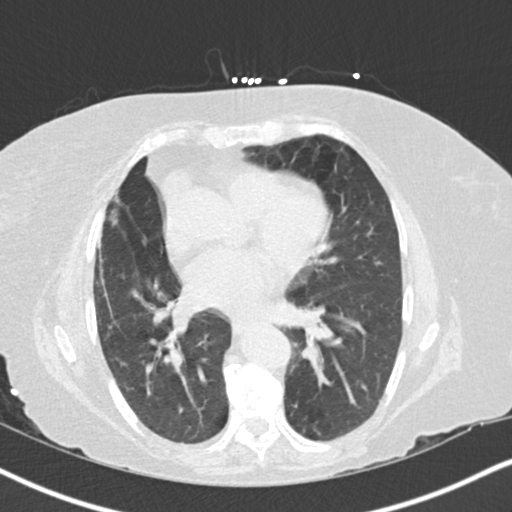
[im 71/127  lung]
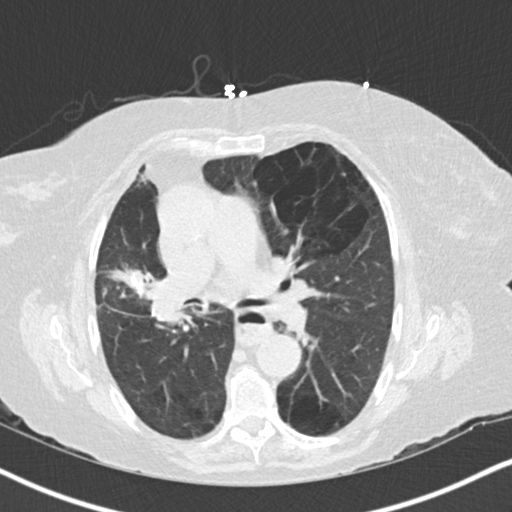
[im 80/127  lung]
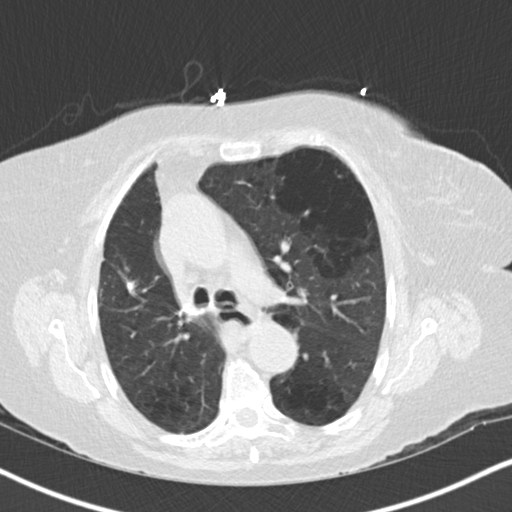
[im 89/127  mediastinal]
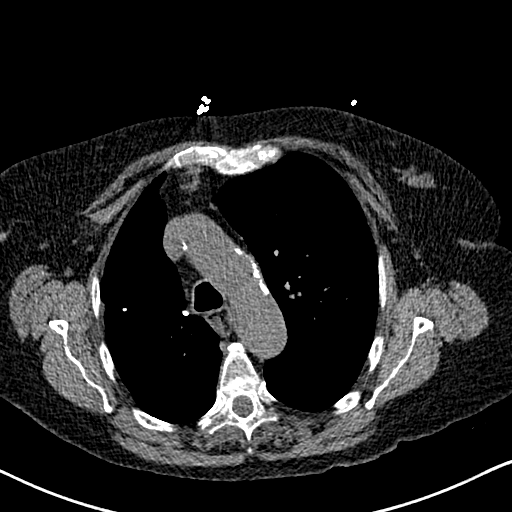
[im 89/127  lung]
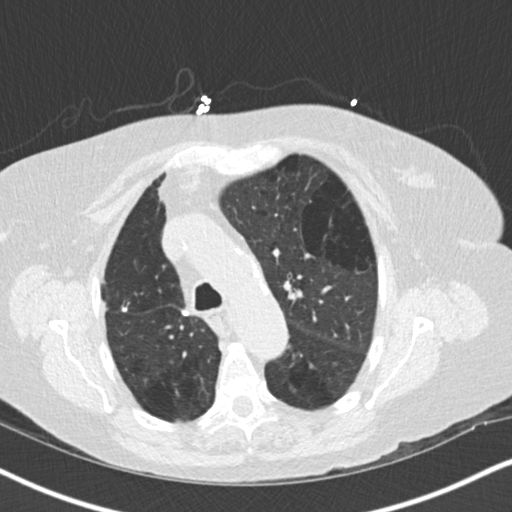
[im 99/127  lung]
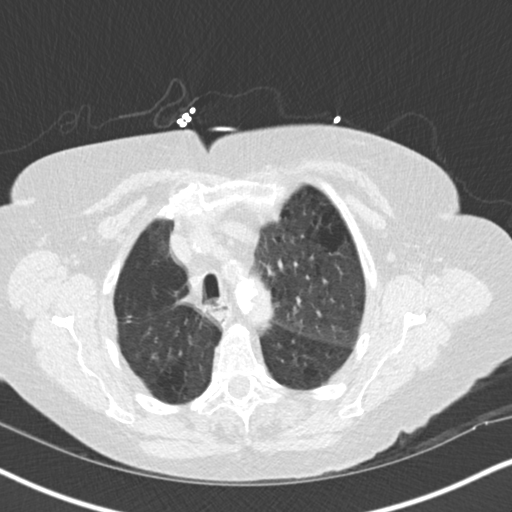
[im 108/127  lung]
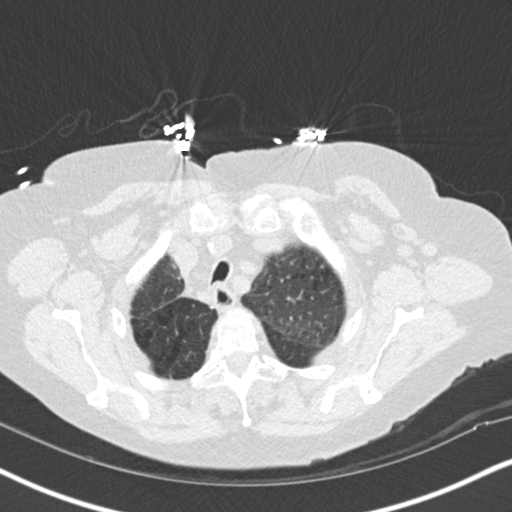
[im 117/127  lung]
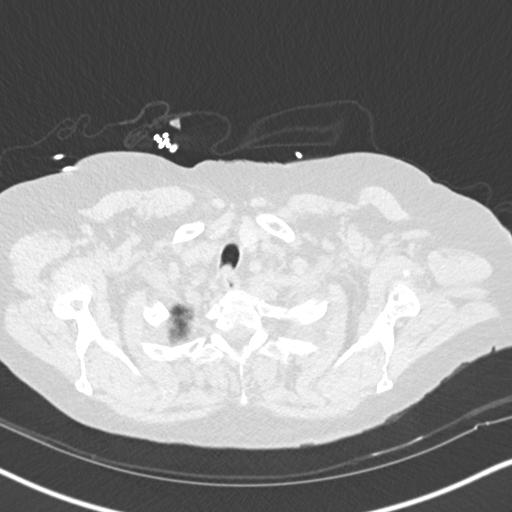

[Series 6: coronal · coronal · 0.52mm/px · 3 of 101 slices shown]
[im 21/101  lung]
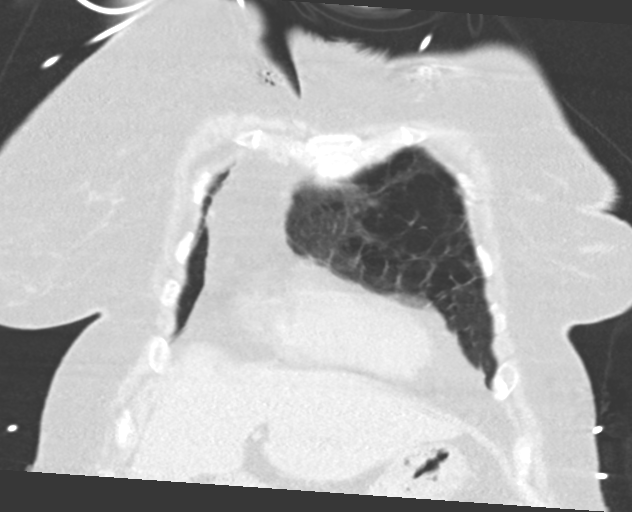
[im 41/101  lung]
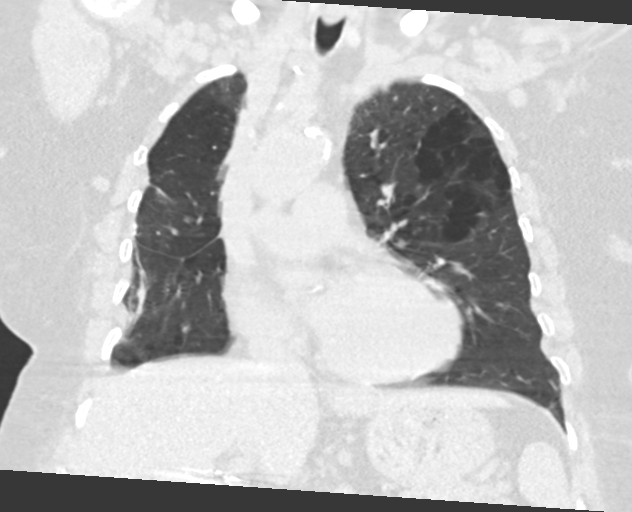
[im 61/101  lung]
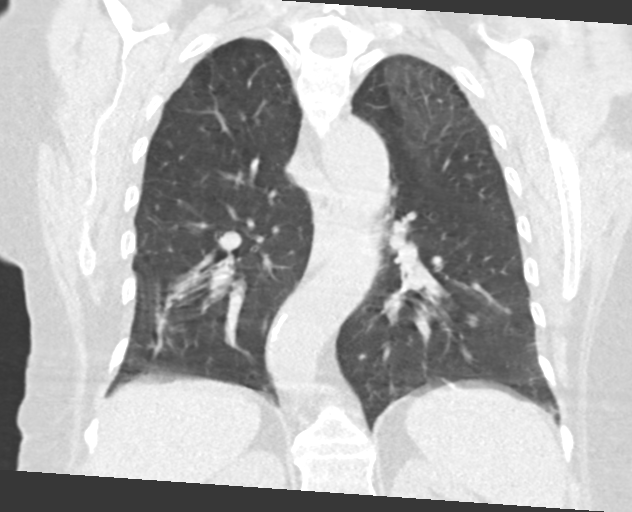

[15 of 36 positions shown; findings below may reference images not displayed]

FINDINGS: Cardiovascular: Normal heart size. No pericardial effusion.
Atherosclerotic calcification of the aorta and coronaries.

Mediastinum/Nodes: Debris seen throughout much of the esophagus
without over distension or superimposed wall thickening. Negative
for mediastinal adenopathy.

Lungs/Pleura: Centrilobular and panlobular emphysema. Prior surgery
on the right for lung cancer. No masslike findings. There is
generalized airway thickening that has increased from prior. This
more significantly affects right-sided airways which are distorted
due to postoperative anatomy. Mild ground-glass density in the right
lower lobe, possible atelectasis or pneumonitis.

Upper Abdomen: Cholecystectomy.

Musculoskeletal: Thoracic spine degeneration and exaggerated
kyphosis.
IMPRESSION: 1. Advanced generalized airway thickening above baseline [DATE], compatible with acute on chronic bronchitis.
2. Ground-glass opacity in the right lower lobe that could be
atelectasis or infection.
3. Postoperative right chest for lung cancer.  No masslike findings.
4. Aortic Atherosclerosis ([2T]-[2T]) and Emphysema ([2T]-[2T]).
5. Debris throughout the esophagus. Consider aspiration as a cause

## 2017-12-08 MED ORDER — POTASSIUM CHLORIDE IN NACL 20-0.9 MEQ/L-% IV SOLN
INTRAVENOUS | Status: DC
  Administered 2017-12-08 (×2): via INTRAVENOUS
  Filled 2017-12-08 (×2): qty 1000

## 2017-12-08 MED ORDER — METOCLOPRAMIDE HCL 5 MG PO TABS
5.0000 mg | ORAL_TABLET | Freq: Two times a day (BID) | ORAL | Status: DC
  Administered 2017-12-08 – 2017-12-12 (×8): 5 mg via ORAL
  Filled 2017-12-08 (×8): qty 1

## 2017-12-08 MED ORDER — PANTOPRAZOLE SODIUM 40 MG PO TBEC
40.0000 mg | DELAYED_RELEASE_TABLET | Freq: Every day | ORAL | Status: DC
  Administered 2017-12-08 – 2017-12-12 (×4): 40 mg via ORAL
  Filled 2017-12-08 (×4): qty 1

## 2017-12-08 MED ORDER — RALOXIFENE HCL 60 MG PO TABS
60.0000 mg | ORAL_TABLET | Freq: Every day | ORAL | Status: DC
  Administered 2017-12-08 – 2017-12-12 (×4): 60 mg via ORAL
  Filled 2017-12-08 (×5): qty 1

## 2017-12-08 MED ORDER — AMLODIPINE BESYLATE 5 MG PO TABS
5.0000 mg | ORAL_TABLET | Freq: Every day | ORAL | Status: DC
  Administered 2017-12-08 – 2017-12-09 (×2): 5 mg via ORAL
  Filled 2017-12-08 (×2): qty 1

## 2017-12-08 MED ORDER — ACETAMINOPHEN 325 MG PO TABS
650.0000 mg | ORAL_TABLET | Freq: Four times a day (QID) | ORAL | Status: DC | PRN
  Administered 2017-12-08: 650 mg via ORAL
  Filled 2017-12-08: qty 2

## 2017-12-08 MED ORDER — DIAZEPAM 5 MG PO TABS
5.0000 mg | ORAL_TABLET | Freq: Every evening | ORAL | Status: DC | PRN
  Administered 2017-12-08 – 2017-12-10 (×3): 5 mg via ORAL
  Filled 2017-12-08 (×3): qty 1

## 2017-12-08 MED ORDER — ALBUTEROL SULFATE (2.5 MG/3ML) 0.083% IN NEBU
2.5000 mg | INHALATION_SOLUTION | Freq: Four times a day (QID) | RESPIRATORY_TRACT | Status: DC
  Administered 2017-12-08 – 2017-12-09 (×7): 2.5 mg via RESPIRATORY_TRACT
  Filled 2017-12-08 (×7): qty 3

## 2017-12-08 MED ORDER — ACETAMINOPHEN 650 MG RE SUPP: 650.0000 mg | Freq: Four times a day (QID) | RECTAL | Status: DC | PRN

## 2017-12-08 MED ORDER — SERTRALINE HCL 100 MG PO TABS
100.0000 mg | ORAL_TABLET | Freq: Every day | ORAL | Status: DC
  Administered 2017-12-08 – 2017-12-12 (×4): 100 mg via ORAL
  Filled 2017-12-08 (×4): qty 1

## 2017-12-08 MED ORDER — IPRATROPIUM BROMIDE 0.02 % IN SOLN
0.5000 mg | Freq: Four times a day (QID) | RESPIRATORY_TRACT | Status: DC
  Administered 2017-12-08 – 2017-12-09 (×7): 0.5 mg via RESPIRATORY_TRACT
  Filled 2017-12-08 (×7): qty 2.5

## 2017-12-08 MED ORDER — DOXYCYCLINE HYCLATE 100 MG PO TABS
100.0000 mg | ORAL_TABLET | Freq: Two times a day (BID) | ORAL | Status: DC
  Administered 2017-12-08 – 2017-12-12 (×8): 100 mg via ORAL
  Filled 2017-12-08 (×8): qty 1

## 2017-12-08 MED ORDER — GABAPENTIN 300 MG PO CAPS
600.0000 mg | ORAL_CAPSULE | Freq: Three times a day (TID) | ORAL | Status: DC
  Administered 2017-12-08 – 2017-12-09 (×4): 600 mg via ORAL
  Filled 2017-12-08 (×4): qty 2

## 2017-12-08 MED ORDER — GLUCOSAMINE 500 MG PO CAPS: ORAL_CAPSULE | Freq: Every day | ORAL | Status: DC

## 2017-12-08 MED ORDER — ALBUTEROL SULFATE (2.5 MG/3ML) 0.083% IN NEBU
2.5000 mg | INHALATION_SOLUTION | RESPIRATORY_TRACT | Status: DC | PRN
  Filled 2017-12-08: qty 3

## 2017-12-08 MED ORDER — SODIUM CHLORIDE 0.9% FLUSH
3.0000 mL | Freq: Two times a day (BID) | INTRAVENOUS | Status: DC
  Administered 2017-12-08 – 2017-12-12 (×5): 3 mL via INTRAVENOUS

## 2017-12-08 MED ORDER — ENOXAPARIN SODIUM 40 MG/0.4ML ~~LOC~~ SOLN
40.0000 mg | SUBCUTANEOUS | Status: DC
  Administered 2017-12-08 – 2017-12-09 (×2): 40 mg via SUBCUTANEOUS
  Filled 2017-12-08 (×3): qty 0.4

## 2017-12-08 MED ORDER — LISINOPRIL 20 MG PO TABS
30.0000 mg | ORAL_TABLET | Freq: Every morning | ORAL | Status: DC
  Administered 2017-12-08 – 2017-12-09 (×2): 30 mg via ORAL
  Filled 2017-12-08 (×2): qty 1

## 2017-12-08 MED ORDER — POLYETHYLENE GLYCOL 3350 17 G PO PACK
17.0000 g | PACK | Freq: Every day | ORAL | Status: DC | PRN
  Administered 2017-12-10: 17 g via ORAL
  Filled 2017-12-08: qty 1

## 2017-12-08 MED ORDER — GUAIFENESIN ER 600 MG PO TB12
600.0000 mg | ORAL_TABLET | Freq: Two times a day (BID) | ORAL | Status: DC
  Administered 2017-12-08 – 2017-12-12 (×8): 600 mg via ORAL
  Filled 2017-12-08 (×8): qty 1

## 2017-12-08 MED ORDER — MELOXICAM 7.5 MG PO TABS
7.5000 mg | ORAL_TABLET | Freq: Every day | ORAL | Status: DC
  Administered 2017-12-08: 7.5 mg via ORAL
  Filled 2017-12-08 (×3): qty 1

## 2017-12-08 MED ORDER — ATORVASTATIN CALCIUM 20 MG PO TABS
20.0000 mg | ORAL_TABLET | Freq: Every day | ORAL | Status: DC
  Administered 2017-12-08 – 2017-12-12 (×4): 20 mg via ORAL
  Filled 2017-12-08 (×4): qty 1

## 2017-12-08 MED ORDER — MAGNESIUM OXIDE 400 (241.3 MG) MG PO TABS
200.0000 mg | ORAL_TABLET | Freq: Every day | ORAL | Status: DC
  Administered 2017-12-08 – 2017-12-12 (×4): 200 mg via ORAL
  Filled 2017-12-08 (×6): qty 1

## 2017-12-08 MED ORDER — MOMETASONE FURO-FORMOTEROL FUM 200-5 MCG/ACT IN AERO
2.0000 | INHALATION_SPRAY | Freq: Two times a day (BID) | RESPIRATORY_TRACT | Status: DC
  Administered 2017-12-08 – 2017-12-12 (×9): 2 via RESPIRATORY_TRACT
  Filled 2017-12-08: qty 8.8

## 2017-12-08 MED ORDER — OXYCODONE HCL 5 MG PO TABS
5.0000 mg | ORAL_TABLET | ORAL | Status: DC | PRN
  Administered 2017-12-09 – 2017-12-11 (×2): 5 mg via ORAL
  Filled 2017-12-08 (×2): qty 1

## 2017-12-08 MED ORDER — HYDROCOD POLST-CPM POLST ER 10-8 MG/5ML PO SUER
5.0000 mL | Freq: Once | ORAL | Status: AC
  Administered 2017-12-08: 5 mL via ORAL
  Filled 2017-12-08: qty 5

## 2017-12-08 MED ORDER — ONDANSETRON HCL 4 MG/2ML IJ SOLN: 4.0000 mg | Freq: Four times a day (QID) | INTRAMUSCULAR | Status: DC | PRN

## 2017-12-08 MED ORDER — ONDANSETRON HCL 4 MG PO TABS: 4.0000 mg | ORAL_TABLET | Freq: Four times a day (QID) | ORAL | Status: DC | PRN

## 2017-12-08 NOTE — ED Notes (Signed)
Pharmacy called about missing meds.

## 2017-12-08 NOTE — ED Notes (Signed)
Breakfast ordered 

## 2017-12-08 NOTE — ED Notes (Signed)
Fluids given.

## 2017-12-08 NOTE — H&P (Signed)
History and Physical    [name] DXI:[id number] DOB: [birth year] DOA: 12/08/2017  PCP: Wenda Low, MD  Patient coming from: Home via EMS  I have personally briefly reviewed patient's old medical records in [place]  Chief Complaint: Acute on chronic shortness of breath with hypoxemia and chest pain which worsened during the night  HPI: [name] is a 77 y.o. 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  Chest pain felt like a pressure in her chest that felt like she could not get a breath.  It was not worsened with breathing.  It was not worsened with position.  She is currently chest pain-free after being treated for her COPD exacerbation with nebulizers steroids and magnesium.  She states that her dyspnea has been present for quite some time likely since October 2017 when she had her lobectomy due to cancer.  However over the past 24 hours she has developed substernal chest discomfort that was a tightness and a pressure it did not radiate.  Her dyspnea worsened due to it.  She has a history of generalized anxiety disorder and gets very nervous when she feels like she cannot breathe.  Denies fever, vomiting, nausea, abdominal pain, syncope, double vision, blurry vision, dysuria, urinary frequency, or headache.  He was noted to be hypoxemic and her course of care in the emergency department here at her sats were noted to be 85% at 4:44 AM.   Chest x-ray was obtained and showed some minimal scarring at the right lung base however her surgery was a right upper lobectomy.  Since treatment in the emergency department the patient's sats have significantly improved she is currently 94% on room air.  She consistently has inspiratory and expiratory wheezing.  They do not clear with cough.  Remains tachycardic and tachypneic and will be admitted into the hospital for further evaluation and management of acute exacerbation of COPD.  Of note when I was examining the patient she was found to have an bedded tick of the upper back excluding the scapular region.  I personally removed the tick from her back and cleaned the area with an alcohol pad and placed a Band-Aid over it.  There was already some erythema at the site of insertion.  She is going to be treated with doxycycline for COPD exacerbation we will continue the treatment for minimum of 10 days to cover for possible Borriella burgdorferi.   Review of Systems: As per HPI otherwise all other systems reviewed and  negative.   Past Medical History:  Diagnosis Date  . Adenocarcinoma of right lung, stage 1 ([place]) 07/28/2016  . Anxiety   . Cancer ([place])    uterine  . COPD (chronic obstructive pulmonary disease) ([place])   . Cyst    left side of neck  . Depression   . GERD (gastroesophageal reflux disease)   . Heart murmur    per patient  . Hyperlipidemia   . Hypertension   . Low back pain   . Lumbar back pain   . Osteoarthritis   .  Osteoporosis     Past Surgical History:  Procedure Laterality Date  . ANKLE SURGERY Right    horse accident  . APPENDECTOMY    . BACK SURGERY    . CHOLECYSTECTOMY    . EYE SURGERY     lense implant  . FRACTURE SURGERY    . HIP SURGERY  01/2010   Left Hip   . INNER EAR SURGERY Bilateral 1970   related to severe ear infections  . KNEE SURGERY     Right knee  . LOBECTOMY Right 05/09/2016   Procedure: RIGHT UPPER LOBECTOMY;  Surgeon: [name],  MD;  Location: [facility];  Service: Thoracic;  Laterality: Right;  . MASS EXCISION  08/25/2011   Procedure: EXCISION MASS;  Surgeon: Harl Bowie, MD;  Location: [facility];  Service: General;  Laterality: N/A;  excision left neck mass  . TOTAL ABDOMINAL HYSTERECTOMY    . VIDEO ASSISTED THORACOSCOPY (VATS)/WEDGE RESECTION Right 05/09/2016   Procedure: VIDEO ASSISTED THORACOSCOPY (VATS)/WEDGE RESECTION;  Surgeon: [name], MD;  Location: [facility];  Service: Thoracic;  Laterality: Right;    Social History   Social History Narrative   Single, lives alone with 2 dogs   3 children   Retired: several careers included Regulatory affairs officer, diners, truck stops   Works now on the weekends at [employer]     reports that she quit smoking about 5 years ago. Her smoking use included cigarettes. She has a 102.00 pack-year smoking history. She has never used smokeless tobacco. She reports that she does not drink alcohol or use drugs.  Allergies  Allergen Reactions  . Boniva [Ibandronate Sodium] Other (See Comments)    UNSPECIFIED REACTION   . Lyrica [Pregabalin] Nausea And Vomiting    Dizziness and blurred vision as well  . Nitrofurantoin Monohyd Macro Rash    Family History  Problem Relation Age of Onset  . Heart disease Mother   . Heart disease Father   . Cancer Sister        #1, breast  . Heart disease Brother        had CABG     Prior to Admission medications   Medication Sig Start Date End Date Taking? Authorizing Provider  albuterol (PROVENTIL HFA;VENTOLIN HFA) 108 (90 BASE) MCG/ACT inhaler Inhale 2 puffs into the lungs every 6 (six) hours as needed. Patient taking differently: Inhale 2 puffs into the lungs every 4 (four) hours as needed for wheezing or shortness of breath.  04/28/15   Hosie Poisson, MD  albuterol (PROVENTIL) (2.5 MG/3ML) 0.083% nebulizer solution Take 3 mLs (2.5 mg total) by nebulization every 2 (two) hours as needed for wheezing or shortness of  breath. 04/28/15   Hosie Poisson, MD  amLODipine (NORVASC) 2.5 MG tablet Take 5 mg by mouth at bedtime.     [provider]  atorvastatin (LIPITOR) 20 MG tablet Take 20 mg by mouth daily.     [provider]  diazepam (VALIUM) 5 MG tablet Take 5 mg by mouth at bedtime as needed for anxiety (for nerves).     [provider]  Fluticasone-Salmeterol (ADVAIR DISKUS) 250-50 MCG/DOSE AEPB Inhale 1 puff into the lungs every 12 (twelve) hours. 08/30/15   Debbe Odea, MD  gabapentin (NEURONTIN) 300 MG capsule Take 2 capsules (600 mg total) by mouth 3 (three) times daily. 03/31/17   Kirsteins, Luanna Salk, MD  Glucosamine HCl (GLUCOSAMINE PO) Take 1 tablet by mouth daily.    [provider]  lisinopril (PRINIVIL,ZESTRIL) 30 MG tablet Take 30 mg by mouth every morning.    [provider]  Magnesium 250 MG TABS Take 250 mg by mouth daily.    [provider]  meloxicam (MOBIC) 7.5 MG tablet Take 1 tablet (7.5 mg total) by mouth daily as needed for pain. 11/06/17   Aundra Dubin, PA-C  metoCLOPramide (REGLAN) 5 MG tablet Take 5 mg by mouth 2 (two) times daily.     [provider]  pantoprazole (PROTONIX) 40 MG tablet Take 1 tablet (40 mg total) by mouth daily at 6 (six) AM. 04/28/15   Hosie Poisson, MD  predniSONE (STERAPRED UNI-PAK 21 TAB) 10 MG (21) TBPK tablet Take as directed 11/06/17   Aundra Dubin, PA-C  raloxifene (EVISTA) 60 MG tablet Take 60 mg by mouth daily.    [provider]  sertraline (ZOLOFT) 100 MG tablet Take 100 mg by mouth daily.    [provider]  tiotropium (SPIRIVA) 18 MCG inhalation capsule Place 18 mcg into inhaler and inhale daily.      [provider]    Physical Exam:  Constitutional: NAD, calm, comfortable Vitals:   12/08/17 0700 12/08/17 0730 12/08/17 0800 12/08/17 0830  BP: 121/86 (!) 135/95 120/71 130/81  Pulse: (!) 103 (!) 118 98 98  Resp: 17 20 (!) 22 (!) 21  Temp:      TempSrc:       SpO2: 97% 95% 93% 94%  Weight:      Height:       Eyes: PERRL, lids and conjunctivae normal ENMT: Mucous membranes are moist. Posterior pharynx clear of any exudate or lesions.Normal dentition.  Neck: normal, supple, no masses, no thyromegaly Respiratory: Tachypneic with decreased breath sounds in the right upper lobe, dullness to percussion at the right base, wheezing throughout both inspiratory and expiratory.  Accessory muscle use noted patient able to speak in full sentences. Cardiovascular: Tachycardic rate and regular rhythm, no murmurs / rubs / gallops. No extremity edema. 2+ pedal pulses. No carotid bruits.  Abdomen: no tenderness, no masses palpated. No hepatosplenomegaly. Bowel sounds positive.  Musculoskeletal: no clubbing / cyanosis. No joint deformity upper and lower extremities. Good ROM, no contractures. Normal muscle tone.  Skin: no rashes, lesions, ulcers. No induration; tick embedded in the right upper back at the level of the fourth or fifth thoracic vertebra centrally.  There is an area of erythema around it approximately 1 cm long by 5 mm wide Neurologic: CN 2-12 grossly intact. Sensation intact, DTR normal. Strength 5/5 in all 4.  Psychiatric: Normal judgment and insight. Alert and oriented x 3. Normal mood.     Labs on Admission: I have personally reviewed following labs and imaging studies  CBC: Recent Labs  Lab 12/08/17 0350  WBC 8.7  NEUTROABS 5.1  HGB 12.2  HCT 38.3  MCV 88.5  PLT 696   Basic Metabolic Panel: Recent Labs  Lab 12/08/17 0350  NA 139  K 3.9  CL 104  CO2 25  GLUCOSE 137*  BUN 11  CREATININE 1.31*  CALCIUM 8.4*   GFR: Estimated Creatinine Clearance: 33.9 mL/min (A) (by C-G formula based on SCr of 1.31 mg/dL (H)). Liver Function Tests: Recent Labs  Lab 12/08/17 0350  AST 21  ALT 11*  ALKPHOS 58  BILITOT 0.4  PROT 6.6  ALBUMIN 3.1*   Cardiac Enzymes: Recent Labs  Lab 12/08/17 0350  TROPONINI 0.06*   Urine analysis:     Component Value Date/Time  COLORURINE YELLOW 05/11/2016 [place] 05/11/2016 1526   LABSPEC 1.009 05/11/2016 1526   PHURINE 6.0 05/11/2016 1526   GLUCOSEU NEGATIVE 05/11/2016 1526   HGBUR NEGATIVE 05/11/2016 [place] 05/11/2016 [place] 05/11/2016 [place] 05/11/2016 1526   UROBILINOGEN 0.2 04/27/2015 1229   NITRITE NEGATIVE 05/11/2016 [place] 05/11/2016 1526    Radiological Exams on Admission: Dg Chest 2 View  Result Date: 12/08/2017 CLINICAL DATA:  Acute onset of shortness of breath and chronic generalized chest pain. EXAM: CHEST - 2 VIEW COMPARISON:  Chest radiograph performed 07/10/2017, and CT of the chest performed 08/02/2017 FINDINGS: The lungs are well-aerated. Minimal scarring is noted at the right lung base. There is no evidence of pleural effusion or pneumothorax. The heart is borderline normal in size. No acute osseous abnormalities are seen. Clips are noted at the right upper quadrant. IMPRESSION: Minimal scarring at the right lung base.  Lungs otherwise clear. Electronically Signed   By: Garald Balding M.D.   On: 12/08/2017 04:23    EKG: Independently reviewed.  Sinus rhythm with left atrial enlargement when compared to May 05, 2016 atrial enlargement is new  Assessment/Plan Principal Problem:   Acute exacerbation of chronic obstructive pulmonary disease (COPD) (HCC) Active Problems:   Substernal chest pain   Elevated troponin   Embedded tick of upper back excluding scapular region   Essential hypertension   Hypercholesterolemia   CKD (chronic kidney disease), stage III (HCC)   S/P lobectomy of lung   Adenocarcinoma of right lung, stage 1 (HCC)   Neuropathic pain syndrome (non-herpetic)   Elevated brain natriuretic peptide (BNP) level   Chronic low back pain   Chronic post-thoracotomy pain   Chronic pain of lower extremity (Tertiary Area of Pain) (B (R>L)   Chronic  hip pain (Fourth Area of Pain) (Bilateral) (R>L)   Chronic sacroiliac joint pain   Chronic chest wall pain(Primary Area of Pain) (R)   History of uterine cancer   Generalized anxiety disorder   GERD (gastroesophageal reflux disease)   1.  Acute exacerbation of chronic obstructive pulmonary disease: Patient does not appear to have any infection she does not have an elevated white blood cell count or fever she is clearly having significant amount of wheezing and coughing associated with an acute exacerbation of COPD.  Chest x-ray does show some opacities that may be a pleural effusion in the right lower lobe.  Her resection was of the right upper lobe.  I am going to obtain a noncontrast CT scan of the chest to evaluate it.  On prior CT scan of her chest in January 2019 there was no evidence of this abnormality.  This will help rule out possible pneumonia although my suspicion is low.  2.  Substernal chest pain: This does not appear to be cardiac in origin however the patient was hypoxemic and may be having some cardiac strain.  I have ordered serial troponins and a repeat EKG for the morning.  I do not believe she will require stress testing at this point.  It would however be a good idea for her to follow-up with cardiology as an outpatient as I do not see a previous stress test obtained on her.  Given her smoking history this may be of benefit also the patient has chronic postoperative chest pain on the right side of her chest where her thoracotomy was performed.  Stress testing may help alleviate  her concerns as she does have a great deal of anxiety.  3.  Elevated troponin as above  4.  Embedded tick of upper back excluding scapular region: I remove the tick clean the area and place a Band-Aid over it.  I have started doxycycline 100 mg p.o. twice daily which she will continue for 10 days.  She should follow-up with Dr. Deforest Hoyles her primary care physician for time disease testing to ensure that there  is no need for prolonged antibiotic treatment.  Given that the tick was present now it is doubtful that she will have had any kind of serologic conversion at this point.  5.  Essential hypertension: Continue her home medication which are amlodipine and lisinopril.  6.  Hypercholesterolemia continue atorvastatin  7.  Chronic kidney disease stage III: Patient with a GFR of 39 with a creatinine of 1.31 which is stable compared to July 2018 we will avoid nephrotoxic agents.  8.  Adenocarcinoma of the right lung stage I status post wedge resection by VATS: Follow-up with oncology has revealed an unremarkable CT scan in January 2019.  Patient will continue follow-up.  She really has no evidence of recurrence.  I am checking a noncontrast CT of the chest the purposes of further evaluating abnormalities noted at the right lower lobe are new compared to previous chest x-ray.  9.  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  10.  Multiple areas of chronic pain: These include the low back, her postthoracotomy pain, chronic pain of the lower extremity, chronic hip pain, chronic sacroiliac pain, and chronic chest wall pain.  Is treated with Mobic at home.  Please note patient has generalized anxiety disorder is therefore at high risk of developing narcotic addiction.  We will treat very conservatively continue home medication.  11.  History of uterine cancer patient takes Evista.  12.  Generalized anxiety disorder: She takes 5 mg of diazepam every evening.  He also takes sertraline daily which we will continue will continue same while hospitalized.  13.  Gastroesophageal reflux disease continue proton pump inhibitor.    DVT prophylaxis: Lovenox Code Status: Full code Family Communication: No family present.  Patient retains capacity make decisions for herself.  She did not request to call anyone. Disposition Plan: Likely home in 3 to 4 days. Consults called: None Admission status:  Inpatient   Lady Deutscher MD FACP [facility] Pager [phone]  If 7PM-7AM, please contact night-coverage [email or web address] Password [email or web address]  12/08/2017, 9:09 AM
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
## 2017-12-08 NOTE — ED Triage Notes (Signed)
Pt arrives via EMS from home with shortness of breath and worsening of her chronic chest pain. Pt reports over the night she became more and more SHOB, her home "puffer" not effective. EMS was called and administered 2 duonebs, 125 MG IV solumedrol and 2G IV magnesium. Hx lung cancer, has had lower lobe removed.

## 2017-12-08 NOTE — ED Notes (Signed)
Med rec has not been completed.

## 2017-12-08 NOTE — ED Notes (Signed)
Pt 02 at 85%, placed on 2L [place]

## 2017-12-08 NOTE — ED Notes (Signed)
Pt to CT

## 2017-12-09 ENCOUNTER — Inpatient Hospital Stay (HOSPITAL_COMMUNITY): Payer: Medicare Other

## 2017-12-09 DIAGNOSIS — R0602 Shortness of breath: Secondary | ICD-10-CM

## 2017-12-09 DIAGNOSIS — M25551 Pain in right hip: Secondary | ICD-10-CM

## 2017-12-09 DIAGNOSIS — C3491 Malignant neoplasm of unspecified part of right bronchus or lung: Secondary | ICD-10-CM

## 2017-12-09 DIAGNOSIS — N183 Chronic kidney disease, stage 3 (moderate): Secondary | ICD-10-CM

## 2017-12-09 DIAGNOSIS — G8922 Chronic post-thoracotomy pain: Secondary | ICD-10-CM

## 2017-12-09 DIAGNOSIS — I214 Non-ST elevation (NSTEMI) myocardial infarction: Secondary | ICD-10-CM

## 2017-12-09 DIAGNOSIS — G8929 Other chronic pain: Secondary | ICD-10-CM

## 2017-12-09 DIAGNOSIS — M25552 Pain in left hip: Secondary | ICD-10-CM

## 2017-12-09 DIAGNOSIS — R0789 Other chest pain: Secondary | ICD-10-CM

## 2017-12-09 LAB — CBC
HCT: 33.6 % — ABNORMAL LOW (ref 36.0–46.0)
Hemoglobin: 10.5 g/dL — ABNORMAL LOW (ref 12.0–15.0)
MCH: 28.2 pg (ref 26.0–34.0)
MCHC: 31.3 g/dL (ref 30.0–36.0)
MCV: 90.1 fL (ref 78.0–100.0)
Platelets: 231 10*3/uL (ref 150–400)
RBC: 3.73 MIL/uL — ABNORMAL LOW (ref 3.87–5.11)
RDW: 15.4 % (ref 11.5–15.5)
WBC: 11 10*3/uL — ABNORMAL HIGH (ref 4.0–10.5)

## 2017-12-09 LAB — ECHOCARDIOGRAM COMPLETE
Height: 61 in
Weight: 2585.5549 oz

## 2017-12-09 LAB — BASIC METABOLIC PANEL
Anion gap: 6 (ref 5–15)
BUN: 13 mg/dL (ref 6–20)
CO2: 28 mmol/L (ref 22–32)
Calcium: 8.3 mg/dL — ABNORMAL LOW (ref 8.9–10.3)
Chloride: 108 mmol/L (ref 101–111)
Creatinine, Ser: 1.18 mg/dL — ABNORMAL HIGH (ref 0.44–1.00)
GFR calc Af Amer: 51 mL/min — ABNORMAL LOW (ref >60)
GFR calc non Af Amer: 44 mL/min — ABNORMAL LOW (ref >60)
Glucose, Bld: 88 mg/dL (ref 65–99)
Potassium: 4.8 mmol/L (ref 3.5–5.1)
Sodium: 142 mmol/L (ref 135–145)

## 2017-12-09 LAB — GLUCOSE, CAPILLARY
Glucose-Capillary: 85 mg/dL (ref 65–99)
Glucose-Capillary: 110 mg/dL — ABNORMAL HIGH (ref 65–99)
Glucose-Capillary: 144 mg/dL — ABNORMAL HIGH (ref 65–99)
Glucose-Capillary: 137 mg/dL — ABNORMAL HIGH (ref 65–99)

## 2017-12-09 LAB — PROTIME-INR
INR: 1
Prothrombin Time: 13.1 seconds (ref 11.4–15.2)

## 2017-12-09 MED ORDER — DOXYCYCLINE HYCLATE 100 MG PO TABS: 100.0000 mg | ORAL_TABLET | Freq: Two times a day (BID) | ORAL | 0 refills | Status: DC

## 2017-12-09 MED ORDER — HEPARIN BOLUS VIA INFUSION
1800.0000 [IU] | Freq: Once | INTRAVENOUS | Status: AC
  Administered 2017-12-09: 1800 [IU] via INTRAVENOUS
  Filled 2017-12-09: qty 1800

## 2017-12-09 MED ORDER — HEPARIN (PORCINE) IN NACL 100-0.45 UNIT/ML-% IJ SOLN
1100.0000 [IU]/h | INTRAMUSCULAR | Status: DC
  Administered 2017-12-09: 750 [IU]/h via INTRAVENOUS
  Filled 2017-12-09 (×2): qty 250

## 2017-12-09 MED ORDER — ASPIRIN 81 MG PO CHEW
81.0000 mg | CHEWABLE_TABLET | Freq: Every day | ORAL | Status: DC
  Administered 2017-12-09 – 2017-12-12 (×3): 81 mg via ORAL
  Filled 2017-12-09 (×3): qty 1

## 2017-12-09 MED ORDER — GABAPENTIN 400 MG PO CAPS
400.0000 mg | ORAL_CAPSULE | Freq: Three times a day (TID) | ORAL | Status: DC
  Administered 2017-12-09 – 2017-12-12 (×8): 400 mg via ORAL
  Filled 2017-12-09 (×8): qty 1

## 2017-12-09 MED ORDER — PREDNISONE 20 MG PO TABS: 20.0000 mg | ORAL_TABLET | Freq: Every day | ORAL | 0 refills | Status: DC

## 2017-12-09 MED ORDER — LISINOPRIL 5 MG PO TABS
5.0000 mg | ORAL_TABLET | Freq: Every morning | ORAL | Status: DC
  Administered 2017-12-10 – 2017-12-12 (×2): 5 mg via ORAL
  Filled 2017-12-09 (×2): qty 1

## 2017-12-09 MED ORDER — PREDNISONE 20 MG PO TABS
40.0000 mg | ORAL_TABLET | Freq: Every day | ORAL | Status: DC
  Administered 2017-12-09 – 2017-12-12 (×3): 40 mg via ORAL
  Filled 2017-12-09 (×4): qty 2
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
## 2017-12-09 NOTE — Progress Notes (Signed)
ANTICOAGULATION CONSULT NOTE - Initial Consult  Pharmacy Consult for Heparin  Indication: chest pain/ACS  Allergies  Allergen Reactions  . Boniva [Ibandronate Sodium] Other (See Comments)    UNSPECIFIED REACTION   . Lyrica [Pregabalin] Nausea And Vomiting    Dizziness and blurred vision as well  . Nitrofurantoin Monohyd Macro Rash    Patient Measurements: Height: 5\' 1"  (154.9 cm) Weight: 161 lb 9.6 oz (73.3 kg) IBW/kg (Calculated) : 47.8 Heparin Dosing Weight: 63 kg  Vital Signs: Temp: 97.5 F (36.4 C) (05/18 0756) Temp Source: Oral (05/18 0756) BP: 126/72 (05/18 1012) Pulse Rate: 78 (05/18 0756)  Labs: Recent Labs    12/08/17 0350 12/08/17 1104 12/08/17 1629 12/08/17 2051 12/09/17 0339  HGB 12.2 11.7*  --   --  10.5*  HCT 38.3 37.5  --   --  33.6*  PLT 275 296  --   --  231  CREATININE 1.31* 1.38*  --   --  1.18*  TROPONINI 0.06* 0.38* 0.33* 0.23*  --     Estimated Creatinine Clearance: 38.3 mL/min (A) (by C-G formula based on SCr of 1.18 mg/dL (H)).   Medical History: Past Medical History:  Diagnosis Date  . Adenocarcinoma of right lung, stage 1 ([place]) 07/28/2016  . Anxiety   . Cancer ([place])    uterine  . COPD (chronic obstructive pulmonary disease) ([place])   . Cyst    left side of neck  . Depression   . GERD (gastroesophageal reflux disease)   . Heart murmur    per patient  . Hyperlipidemia   . Hypertension   . Low back pain   . Lumbar back pain   . Osteoarthritis   . Osteoporosis     Medications:  Scheduled:  . albuterol  2.5 mg Nebulization Q6H  . amLODipine  5 mg Oral QHS  . aspirin  81 mg Oral Daily  . atorvastatin  20 mg Oral Daily  . doxycycline  100 mg Oral Q12H  . enoxaparin (LOVENOX) injection  40 mg Subcutaneous Q24H  . gabapentin  400 mg Oral TID  . guaiFENesin  600 mg Oral BID  . ipratropium  0.5 mg Nebulization Q6H  . lisinopril  30 mg Oral q morning - 10a  . magnesium oxide  200 mg Oral Daily  . metoCLOPramide  5 mg Oral BID   . mometasone-formoterol  2 puff Inhalation BID  . pantoprazole  40 mg Oral Q0600  . predniSONE  40 mg Oral Q breakfast  . raloxifene  60 mg Oral Daily  . sertraline  100 mg Oral Daily  . sodium chloride flush  3 mL Intravenous Q12H    Assessment: 76 yof presented 5/17 with SOB, hypoxemia, and chest pain. Her troponins were elevated and new T was inversion seen on EKG. She was started on heparin 5/18 along with aspirin.   Patient received enoxaparin 40mg  [place] for DVT ppx today, so will only give half of a heparin load. Baseline hemoglobin 10.5 and platelets are within normal limits.   Goal of Therapy:  Heparin level 0.3-0.7 units/ml Monitor platelets by anticoagulation protocol: Yes   Plan:  Give 1800 units bolus x 1 Start heparin infusion at 750 units/hr Check anti-Xa level in 8 hours and daily while on heparin Continue to monitor H&H and platelets  Plans for cath Mon - Follow up   Demetrius Charity, PharmD PGY2 Oncology Pharmacy Resident  Pharmacy Phone: [phone] 12/09/2017

## 2017-12-09 NOTE — CV Procedure (Signed)
Echocardiogram with definity was attempted but the IV wasn't working. Will try again in the morning.  Darlina Sicilian RDCS

## 2017-12-09 NOTE — Progress Notes (Addendum)
Cardiology Consultation:   Patient ID: [name]; [id number]; [birth year]   Admit date: 12/08/2017 Date of Consult: 12/09/2017  Primary Care Provider: Wenda Low, MD Primary Cardiologist: Domenic Polite MD   Patient Profile:   [name] is a 77 y.o. female with a hx of hypertension, hyperlipidemia, COPD, lung cancer who is being seen today for the evaluation of NSTEMI at the request of Domenic Polite MD.  History of Present Illness:   Patient has no significant cardiac history.  She does have dyspnea on exertion but denies orthopnea, PND, pedal edema, exertional chest pain or syncope.  She awoke yesterday a.m. with dyspnea and cough.  She denies any chest pain.  She did states she has some chest heaviness 2 weeks ago.  Her dyspnea worsened and she presented to the emergency room for further evaluation.  Her cardiac enzymes are mildly abnormal and cardiology asked to evaluate.  Her dyspnea has resolved and she denies chest pain at the time of the evaluation.  Of note the H&P states that she did describe chest pressure at time of admission but she denies this to me.   Past Medical History:  Diagnosis Date  . Adenocarcinoma of right lung, stage 1 ([place]) 07/28/2016  . Anxiety   . Cancer ([place])    uterine  . COPD (chronic obstructive pulmonary disease) ([place])   . Cyst    left side of neck  . Depression   . GERD (gastroesophageal reflux disease)   . Heart murmur    per patient  . Hyperlipidemia   . Hypertension   . Low back pain   . Lumbar back pain   . Osteoarthritis   . Osteoporosis     Past Surgical History:  Procedure Laterality Date  . ANKLE SURGERY Right    horse accident  . APPENDECTOMY    . BACK SURGERY    . CHOLECYSTECTOMY    . EYE SURGERY     lense implant  . FRACTURE SURGERY    . HIP SURGERY  01/2010   Left Hip   . INNER EAR SURGERY Bilateral 1970   related to severe ear infections  . KNEE SURGERY     Right knee  . LOBECTOMY Right 05/09/2016   Procedure: RIGHT UPPER LOBECTOMY;  Surgeon: [name], MD;  Location: [facility];  Service: Thoracic;  Laterality: Right;  . MASS EXCISION  08/25/2011   Procedure: EXCISION MASS;  Surgeon: Harl Bowie, MD;  Location: [facility];  Service: General;  Laterality: N/A;  excision left neck mass  . TOTAL ABDOMINAL HYSTERECTOMY    . VIDEO ASSISTED THORACOSCOPY (VATS)/WEDGE RESECTION Right 05/09/2016   Procedure: VIDEO ASSISTED THORACOSCOPY (VATS)/WEDGE RESECTION;  Surgeon: [name], MD;  Location: [facility];  Service: Thoracic;  Laterality: Right;     Inpatient Medications: Scheduled Meds: . albuterol  2.5 mg Nebulization Q6H  . amLODipine  5 mg Oral QHS  . atorvastatin  20 mg Oral Daily  . doxycycline  100 mg Oral Q12H  . enoxaparin (LOVENOX) injection  40 mg Subcutaneous Q24H  . gabapentin  400 mg Oral TID  . guaiFENesin  600 mg Oral BID  . ipratropium  0.5 mg Nebulization Q6H  . lisinopril  30 mg Oral q morning - 10a  . magnesium oxide  200 mg Oral Daily  . metoCLOPramide  5 mg Oral BID  . mometasone-formoterol  2 puff Inhalation BID  . pantoprazole  40 mg Oral Q0600  . predniSONE  40 mg Oral Q breakfast  . raloxifene  60 mg Oral Daily  . sertraline  100 mg Oral Daily  . sodium chloride flush  3 mL Intravenous Q12H   Continuous Infusions:  PRN Meds: acetaminophen **OR** acetaminophen, albuterol, diazepam, ondansetron **OR** ondansetron (ZOFRAN) IV, oxyCODONE, polyethylene glycol  Allergies:    Allergies  Allergen Reactions  . Boniva [Ibandronate Sodium] Other (See Comments)    UNSPECIFIED REACTION   . Lyrica [Pregabalin] Nausea And Vomiting    Dizziness and blurred vision as well  . Nitrofurantoin Monohyd Macro Rash    Social History:   Social History   Socioeconomic History  . Marital status: Widowed    Spouse name: Not on file  . Number of children: Not on file  . Years of education: Not on file  . Highest education level: Not on  file  Occupational History  . Not on file  Social Needs  . Financial resource strain: Not on file  . Food insecurity:    Worry: Not on file    Inability: Not on file  . Transportation needs:    Medical: Not on file    Non-medical: Not on file  Tobacco Use  . Smoking status: Former Smoker    Packs/day: 2.00    Years: 51.00    Pack years: 102.00    Types: Cigarettes    Last attempt to quit: 05/04/2012    Years since quitting: 5.6  . Smokeless tobacco: Never Used  Substance and Sexual Activity  . Alcohol use: No    Alcohol/week: 0.0 oz  . Drug use: No  . Sexual activity: Yes    Birth control/protection: Other-see comments  Lifestyle  . Physical activity:    Days per week: Not on file    Minutes per session: Not on file  . Stress: Not on file  Relationships  . Social connections:    Talks on phone: Not on file    Gets together: Not on file    Attends religious service: Not on file    Active member of club or organization: Not on file    Attends meetings of clubs or organizations: Not on file    Relationship status: Not on file  . Intimate partner violence:    Fear of current or ex partner: Not on file    Emotionally abused: Not on file    Physically abused: Not on file    Forced sexual activity: Not on file  Other Topics Concern  . Not on file  Social History Narrative   Single, lives alone with 2 dogs   3 children   Retired: several careers included Regulatory affairs officer, diners, truck stops   Works now on the weekends at [employer]    Family History:    Family History  Problem Relation Age of Onset  . Heart disease Mother   . Heart disease Father   . Cancer Sister        #1, breast  . Heart disease Brother        had CABG     ROS:  Please see the history of present illness.  Nonproductive cough. All other ROS reviewed and negative.     Physical Exam/Data:   Vitals:   12/09/17 0500 12/09/17 0756 12/09/17 0807 12/09/17 1012  BP:  (!) 137/102  126/72  Pulse:   78    Resp:  20    Temp:  (!) 97.5 F (36.4 C)    TempSrc:  Oral  SpO2:  95% 95%   Weight: 161 lb 9.6 oz (73.3 kg)     Height:        Intake/Output Summary (Last 24 hours) at 12/09/2017 1403 Last data filed at 12/09/2017 1036 Gross per 24 hour  Intake 1204.25 ml  Output -  Net 1204.25 ml   Filed Weights   12/08/17 0344 12/08/17 1628 12/09/17 0500  Weight: 156 lb (70.8 kg) 156 lb 12 oz (71.1 kg) 161 lb 9.6 oz (73.3 kg)   Body mass index is 30.53 kg/m.  General:  Well nourished, well developed, in no acute distress HEENT: normal Lymph: no adenopathy Neck: no JVD Endocrine:  No thryomegaly Vascular: No carotid bruits; FA pulses 2+ bilaterally without bruits  Cardiac:  normal S1, S2; RRR; no murmur  Lungs: Diminished breath sounds throughout Abd: soft, nontender, no hepatomegaly  Ext: no edema Musculoskeletal:  No deformities, BUE and BLE strength normal and equal Skin: warm and dry  Neuro:  CNs 2-12 intact, no focal abnormalities noted Psych:  Normal affect   EKG:  The EKG was personally reviewed and demonstrates:  Initial electrocardiogram showed sinus rhythm but no ST changes.  Follow-up ECG today shows sinus rhythm with marked anterior T wave inversion and prolonged QT interval. Telemetry:  Telemetry was personally reviewed and demonstrates: Sinus rhythm.  Laboratory Data:  Chemistry Recent Labs  Lab 12/08/17 0350 12/08/17 1104 12/09/17 0339  NA 139  --  142  K 3.9  --  4.8  CL 104  --  108  CO2 25  --  28  GLUCOSE 137*  --  88  BUN 11  --  13  CREATININE 1.31* 1.38* 1.18*  CALCIUM 8.4*  --  8.3*  GFRNONAA 39* 37* 44*  GFRAA 45* 42* 51*  ANIONGAP 10  --  6    Recent Labs  Lab 12/08/17 0350  PROT 6.6  ALBUMIN 3.1*  AST 21  ALT 11*  ALKPHOS 58  BILITOT 0.4   Hematology Recent Labs  Lab 12/08/17 0350 12/08/17 1104 12/09/17 0339  WBC 8.7 6.7 11.0*  RBC 4.33 4.27 3.73*  HGB 12.2 11.7* 10.5*  HCT 38.3 37.5 33.6*  MCV 88.5 87.8 90.1  MCH  28.2 27.4 28.2  MCHC 31.9 31.2 31.3  RDW 14.8 14.8 15.4  PLT 275 296 231   Cardiac Enzymes Recent Labs  Lab 12/08/17 0350 12/08/17 1104 12/08/17 1629 12/08/17 2051  TROPONINI 0.06* 0.38* 0.33* 0.23*   BNP Recent Labs  Lab 12/08/17 0350  BNP 116.2*    Radiology/Studies:  Dg Chest 2 View  Result Date: 12/08/2017 CLINICAL DATA:  Acute onset of shortness of breath and chronic generalized chest pain. EXAM: CHEST - 2 VIEW COMPARISON:  Chest radiograph performed 07/10/2017, and CT of the chest performed 08/02/2017 FINDINGS: The lungs are well-aerated. Minimal scarring is noted at the right lung base. There is no evidence of pleural effusion or pneumothorax. The heart is borderline normal in size. No acute osseous abnormalities are seen. Clips are noted at the right upper quadrant. IMPRESSION: Minimal scarring at the right lung base.  Lungs otherwise clear. Electronically Signed   By: Garald Balding M.D.   On: 12/08/2017 04:23   Ct Chest Wo Contrast  Result Date: 12/08/2017 CLINICAL DATA:  Acute respiratory illness. COPD exacerbation, complicated EXAM: CT CHEST WITHOUT CONTRAST TECHNIQUE: Multidetector CT imaging of the chest was performed following the standard protocol without IV contrast. COMPARISON:  08/02/2017 FINDINGS: Cardiovascular: Normal heart size. No pericardial effusion. Atherosclerotic calcification  of the aorta and coronaries. Mediastinum/Nodes: Debris seen throughout much of the esophagus without over distension or superimposed wall thickening. Negative for mediastinal adenopathy. Lungs/Pleura: Centrilobular and panlobular emphysema. Prior surgery on the right for lung cancer. No masslike findings. There is generalized airway thickening that has increased from prior. This more significantly affects right-sided airways which are distorted due to postoperative anatomy. Mild ground-glass density in the right lower lobe, possible atelectasis or pneumonitis. Upper Abdomen:  Cholecystectomy. Musculoskeletal: Thoracic spine degeneration and exaggerated kyphosis. IMPRESSION: 1. Advanced generalized airway thickening above baseline January 2019, compatible with acute on chronic bronchitis. 2. Ground-glass opacity in the right lower lobe that could be atelectasis or infection. 3. Postoperative right chest for lung cancer.  No masslike findings. 4. Aortic Atherosclerosis (ICD10-I70.0) and Emphysema (ICD10-J43.9). 5. Debris throughout the esophagus. Consider aspiration as a cause of #2. Electronically Signed   By: Monte Fantasia M.D.   On: 12/08/2017 10:34    Assessment and Plan:   1. Non-ST elevation myocardial infarction-patient presented with dyspnea.  The H&P states she describe chest tightness although she denies this to me.  Her troponin is elevated and her electrocardiogram shows marked anterior T wave inversion which is new compared to admission.  This is concerning for LAD lesion.  I will add aspirin and heparin.  Continue statin.  I am hesitant to add beta blockade given severity of COPD.  She will require cardiac catheterization.  The risks and benefits including myocardial infarction, CVA and death discussed and she agrees to proceed.  Plan on Monday. 2. Possible apical thrombus-I have personally reviewed the patient's echocardiogram.  She appears to have distal septal and apical hypokinesis.  Ejection fraction is 40 to 45%.  We will repeat echocardiogram with contrast to rule out thrombus. 3. COPD-management per primary service.  Continue bronchodilators and steroids. 4. Hypertension-blood pressure is controlled.  Continue present medications. 5. Hyperlipidemia-continue statin. 6. Chronic stage III kidney disease-we will gently hydrate prior to catheterization.  Follow renal function after procedure.   For questions or updates, please contact [facility] Please consult [email or web address] for contact info under Cardiology/STEMI.   Signed, Kirk Ruths, MD    12/09/2017 2:03 PM

## 2017-12-09 NOTE — Progress Notes (Signed)
  Echocardiogram 2D Echocardiogram has been performed.  Darlina Sicilian M 12/09/2017, 1:03 PM

## 2017-12-09 NOTE — Progress Notes (Addendum)
PROGRESS NOTE    CHRISS REDEL  AOZ:[id number] DOB: 08/24/1940 DOA: 12/08/2017 PCP: Wenda Low, MD  Brief Narrative:KYNSLIE RINGLE is a 77 y.o. 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  Assessment & Plan:   1.  Acute exacerbation of chronic obstructive pulmonary disease:  -noted to have significant amount of wheezing and coughing associated with an acute exacerbation of COPD, Chest x-ray does show some opacities that may be a pleural effusion in the right lower lobe -CT chest with lower lobe ground glass opacities, ? Infection -continue Abx Doxy, add prednisone -nebs  2.  Chest pain: -located all across chest especially R side/axillay line, pleuritic, in setting of wheezing and cough and chronic component is R mid axillar line -troponin elevated to 0.3 then down to 0.2, likley due to demand -Admission EKG without acute ST T wave findings -check ECHO if normal wall motion then outpatient Cards FU for Stress  -resolved with Rx of #1  3.  Elevated troponin as above  4.  Embedded tick of upper back excluding scapular region:  -removed -continue doxycycline 100 mg p.o. twice daily for 10 days  5.  Essential hypertension:  -Continue her home medication which are amlodipine and lisinopril.  6.  Hypercholesterolemia continue atorvastatin  7.  Chronic kidney disease stage III: Patient with a GFR of 39 with a creatinine of  1.31 which is stable compared to July 2018 -stop NSAIDs  8.  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  10.  Multiple areas of chronic pain: These include the low back, her postthoracotomy pain, chronic pain of the lower extremity, chronic hip pain, chronic sacroiliac pain, and chronic chest wall pain.  Is treated with Mobic at home.  Please note patient has generalized anxiety disorder is therefore at high risk of developing narcotic addiction.  We will treat very conservatively continue home medication. -continue Gabapentin  11.  History of uterine cancer patient takes Evista.  12.  Generalized anxiety disorder: She takes 5 mg of diazepam every evening.  He also takes sertraline daily which we will continue will continue same while hospitalized.  13.  Gastroesophageal reflux disease continue proton pump inhibitor.   DVT prophylaxis: Lovenox Code Status: Full code Family Communication: No family present. Disposition Plan: home today if stable and ECHO unremarkable   Procedures: ECHO  Antimicrobials:    Subjective: -cough and shortness of breath has subsided -chest pain has resolved -anxious to go home  Objective: Vitals:   12/09/17 0500 12/09/17 0756 12/09/17 0807 12/09/17 1012  BP:  (!) 137/102  126/72  Pulse:  78    Resp:  20    Temp:  (!) 97.5 F (36.4 C)    TempSrc:  Oral    SpO2:  95% 95%   Weight: 73.3 kg (161 lb 9.6 oz)     Height:  Intake/Output Summary (Last 24 hours) at 12/09/2017 1247 Last data filed at 12/09/2017 1036 Gross per 24 hour  Intake 1204.25 ml  Output -  Net 1204.25 ml   Filed Weights   12/08/17 0344 12/08/17 1628 12/09/17 0500  Weight: 70.8 kg (156 lb) 71.1 kg (156 lb 12 oz) 73.3 kg (161 lb 9.6 oz)    Examination:  General  exam: Appears calm and comfortable  Respiratory system: rare exp wheezed Cardiovascular system: S1 & S2 heard, RRR. No JVD, murmurs, rubs, gallops Gastrointestinal system: Abdomen is nondistended, soft and nontender.Normal bowel sounds heard. Central nervous system: Alert and oriented. No focal neurological deficits. Extremities: Symmetric 5 x 5 power. Skin: No rashes, lesions or ulcers Psychiatry: Judgement and insight appear normal. Mood & affect appropriate.     Data Reviewed:   CBC: Recent Labs  Lab 12/08/17 0350 12/08/17 1104 12/09/17 0339  WBC 8.7 6.7 11.0*  NEUTROABS 5.1  --   --   HGB 12.2 11.7* 10.5*  HCT 38.3 37.5 33.6*  MCV 88.5 87.8 90.1  PLT 275 296 149   Basic Metabolic Panel: Recent Labs  Lab 12/08/17 0350 12/08/17 1104 12/09/17 0339  NA 139  --  142  K 3.9  --  4.8  CL 104  --  108  CO2 25  --  28  GLUCOSE 137*  --  88  BUN 11  --  13  CREATININE 1.31* 1.38* 1.18*  CALCIUM 8.4*  --  8.3*   GFR: Estimated Creatinine Clearance: 38.3 mL/min (A) (by C-G formula based on SCr of 1.18 mg/dL (H)). Liver Function Tests: Recent Labs  Lab 12/08/17 0350  AST 21  ALT 11*  ALKPHOS 58  BILITOT 0.4  PROT 6.6  ALBUMIN 3.1*   No results for input(s): LIPASE, AMYLASE in the last 168 hours. No results for input(s): AMMONIA in the last 168 hours. Coagulation Profile: No results for input(s): INR, PROTIME in the last 168 hours. Cardiac Enzymes: Recent Labs  Lab 12/08/17 0350 12/08/17 1104 12/08/17 1629 12/08/17 2051  TROPONINI 0.06* 0.38* 0.33* 0.23*   BNP (last 3 results) No results for input(s): PROBNP in the last 8760 hours. HbA1C: No results for input(s): HGBA1C in the last 72 hours. CBG: Recent Labs  Lab 12/09/17 0718 12/09/17 1200  GLUCAP 85 110*   Lipid Profile: No results for input(s): CHOL, HDL, LDLCALC, TRIG, CHOLHDL, LDLDIRECT in the last 72 hours. Thyroid Function Tests: No results for input(s): TSH, T4TOTAL, FREET4, T3FREE,  THYROIDAB in the last 72 hours. Anemia Panel: No results for input(s): VITAMINB12, FOLATE, FERRITIN, TIBC, IRON, RETICCTPCT in the last 72 hours. Urine analysis:    Component Value Date/Time   COLORURINE YELLOW 12/08/2017 1646   APPEARANCEUR HAZY (A) 12/08/2017 1646   LABSPEC 1.010 12/08/2017 1646   PHURINE 5.0 12/08/2017 1646   GLUCOSEU NEGATIVE 12/08/2017 1646   HGBUR SMALL (A) 12/08/2017 1646   BILIRUBINUR NEGATIVE 12/08/2017 1646   KETONESUR NEGATIVE 12/08/2017 1646   PROTEINUR NEGATIVE 12/08/2017 1646   UROBILINOGEN 0.2 04/27/2015 1229   NITRITE POSITIVE (A) 12/08/2017 1646   LEUKOCYTESUR TRACE (A) 12/08/2017 1646   Sepsis Labs: @LABRCNTIP (procalcitonin:4,lacticidven:4)  )No results found for this or any previous visit (from the past 240 hour(s)).       Radiology Studies: Dg Chest 2 View  Result Date: 12/08/2017 CLINICAL DATA:  Acute onset of shortness of breath and chronic generalized chest pain. EXAM: CHEST - 2 VIEW COMPARISON:  Chest radiograph performed 07/10/2017, and CT of the chest  performed 08/02/2017 FINDINGS: The lungs are well-aerated. Minimal scarring is noted at the right lung base. There is no evidence of pleural effusion or pneumothorax. The heart is borderline normal in size. No acute osseous abnormalities are seen. Clips are noted at the right upper quadrant. IMPRESSION: Minimal scarring at the right lung base.  Lungs otherwise clear. Electronically Signed   By: Garald Balding M.D.   On: 12/08/2017 04:23   Ct Chest Wo Contrast  Result Date: 12/08/2017 CLINICAL DATA:  Acute respiratory illness. COPD exacerbation, complicated EXAM: CT CHEST WITHOUT CONTRAST TECHNIQUE: Multidetector CT imaging of the chest was performed following the standard protocol without IV contrast. COMPARISON:  08/02/2017 FINDINGS: Cardiovascular: Normal heart size. No pericardial effusion. Atherosclerotic calcification of the aorta and coronaries. Mediastinum/Nodes: Debris seen throughout  much of the esophagus without over distension or superimposed wall thickening. Negative for mediastinal adenopathy. Lungs/Pleura: Centrilobular and panlobular emphysema. Prior surgery on the right for lung cancer. No masslike findings. There is generalized airway thickening that has increased from prior. This more significantly affects right-sided airways which are distorted due to postoperative anatomy. Mild ground-glass density in the right lower lobe, possible atelectasis or pneumonitis. Upper Abdomen: Cholecystectomy. Musculoskeletal: Thoracic spine degeneration and exaggerated kyphosis. IMPRESSION: 1. Advanced generalized airway thickening above baseline January 2019, compatible with acute on chronic bronchitis. 2. Ground-glass opacity in the right lower lobe that could be atelectasis or infection. 3. Postoperative right chest for lung cancer.  No masslike findings. 4. Aortic Atherosclerosis (ICD10-I70.0) and Emphysema (ICD10-J43.9). 5. Debris throughout the esophagus. Consider aspiration as a cause of #2. Electronically Signed   By: Monte Fantasia M.D.   On: 12/08/2017 10:34        Scheduled Meds: . albuterol  2.5 mg Nebulization Q6H  . amLODipine  5 mg Oral QHS  . atorvastatin  20 mg Oral Daily  . doxycycline  100 mg Oral Q12H  . enoxaparin (LOVENOX) injection  40 mg Subcutaneous Q24H  . gabapentin  600 mg Oral TID  . guaiFENesin  600 mg Oral BID  . ipratropium  0.5 mg Nebulization Q6H  . lisinopril  30 mg Oral q morning - 10a  . magnesium oxide  200 mg Oral Daily  . metoCLOPramide  5 mg Oral BID  . mometasone-formoterol  2 puff Inhalation BID  . pantoprazole  40 mg Oral Q0600  . predniSONE  40 mg Oral Q breakfast  . raloxifene  60 mg Oral Daily  . sertraline  100 mg Oral Daily  . sodium chloride flush  3 mL Intravenous Q12H   Continuous Infusions:   LOS: 1 day    Time spent: 95min    Domenic Polite, MD [facility] Page via [email or web address], password [email or web address] After 7PM  please contact night-coverage  12/09/2017, 12:47 PM

## 2017-12-10 ENCOUNTER — Inpatient Hospital Stay (HOSPITAL_COMMUNITY): Payer: Medicare Other

## 2017-12-10 DIAGNOSIS — R0602 Shortness of breath: Secondary | ICD-10-CM

## 2017-12-10 LAB — BASIC METABOLIC PANEL
Anion gap: 7 (ref 5–15)
BUN: 12 mg/dL (ref 6–20)
CO2: 26 mmol/L (ref 22–32)
Calcium: 8.4 mg/dL — ABNORMAL LOW (ref 8.9–10.3)
Chloride: 107 mmol/L (ref 101–111)
Creatinine, Ser: 1.1 mg/dL — ABNORMAL HIGH (ref 0.44–1.00)
GFR calc Af Amer: 56 mL/min — ABNORMAL LOW (ref >60)
GFR calc non Af Amer: 48 mL/min — ABNORMAL LOW (ref >60)
Glucose, Bld: 91 mg/dL (ref 65–99)
Potassium: 4.4 mmol/L (ref 3.5–5.1)
Sodium: 140 mmol/L (ref 135–145)

## 2017-12-10 LAB — CBC
HCT: 33.4 % — ABNORMAL LOW (ref 36.0–46.0)
Hemoglobin: 10.3 g/dL — ABNORMAL LOW (ref 12.0–15.0)
MCH: 28.1 pg (ref 26.0–34.0)
MCHC: 30.8 g/dL (ref 30.0–36.0)
MCV: 91 fL (ref 78.0–100.0)
Platelets: 224 10*3/uL (ref 150–400)
RBC: 3.67 MIL/uL — ABNORMAL LOW (ref 3.87–5.11)
RDW: 15.3 % (ref 11.5–15.5)
WBC: 9.7 10*3/uL (ref 4.0–10.5)

## 2017-12-10 LAB — HEPARIN LEVEL (UNFRACTIONATED)
Heparin Unfractionated: 0.31 IU/mL (ref 0.30–0.70)
Heparin Unfractionated: 0.28 IU/mL — ABNORMAL LOW (ref 0.30–0.70)
Heparin Unfractionated: <0.1 IU/mL — ABNORMAL LOW (ref 0.30–0.70)

## 2017-12-10 LAB — ECHOCARDIOGRAM LIMITED
Height: 61 in
Weight: 2666.68 oz

## 2017-12-10 LAB — GLUCOSE, CAPILLARY: Glucose-Capillary: 95 mg/dL (ref 65–99)

## 2017-12-10 MED ORDER — SODIUM CHLORIDE 0.9 % WEIGHT BASED INFUSION
1.0000 mL/kg/h | INTRAVENOUS | Status: DC
  Administered 2017-12-10: 1 mL/kg/h via INTRAVENOUS

## 2017-12-10 MED ORDER — SODIUM CHLORIDE 0.9 % IV SOLN: 250.0000 mL | INTRAVENOUS | Status: DC | PRN

## 2017-12-10 MED ORDER — SODIUM CHLORIDE 0.9% FLUSH: 3.0000 mL | Freq: Two times a day (BID) | INTRAVENOUS | Status: DC

## 2017-12-10 MED ORDER — SODIUM CHLORIDE 0.9% FLUSH: 3.0000 mL | INTRAVENOUS | Status: DC | PRN

## 2017-12-10 MED ORDER — PERFLUTREN LIPID MICROSPHERE
1.0000 mL | INTRAVENOUS | Status: AC | PRN
  Administered 2017-12-10: 4 mL via INTRAVENOUS
  Filled 2017-12-10: qty 10

## 2017-12-10 MED ORDER — HEPARIN BOLUS VIA INFUSION
950.0000 [IU] | Freq: Once | INTRAVENOUS | Status: AC
  Administered 2017-12-10: 950 [IU] via INTRAVENOUS
  Filled 2017-12-10: qty 950

## 2017-12-10 MED ORDER — ASPIRIN 81 MG PO CHEW
81.0000 mg | CHEWABLE_TABLET | ORAL | Status: AC
  Administered 2017-12-11: 81 mg via ORAL
  Filled 2017-12-10: qty 1

## 2017-12-10 MED ORDER — IPRATROPIUM-ALBUTEROL 0.5-2.5 (3) MG/3ML IN SOLN
3.0000 mL | Freq: Three times a day (TID) | RESPIRATORY_TRACT | Status: DC
  Administered 2017-12-10 – 2017-12-11 (×6): 3 mL via RESPIRATORY_TRACT
  Filled 2017-12-10 (×6): qty 3

## 2017-12-10 MED ORDER — METOPROLOL TARTRATE 12.5 MG HALF TABLET
12.5000 mg | ORAL_TABLET | Freq: Two times a day (BID) | ORAL | Status: DC
  Administered 2017-12-10 – 2017-12-12 (×4): 12.5 mg via ORAL
  Filled 2017-12-10 (×4): qty 1

## 2017-12-10 NOTE — Progress Notes (Signed)
PROGRESS NOTE    [name]  BMW:[id number] DOB: 04-03-1941 DOA: 12/08/2017 PCP: Wenda Low, MD  Brief Narrative:[name] is a 77 y.o. 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 -and improved with treatment for COPD flare, however also noted to have elevated troponin and abnormal echocardiogram  Assessment & Plan:   1.  Acute exacerbation of chronic obstructive pulmonary disease:  -noted to have significant amount of wheezing and coughing associated with an acute exacerbation of COPD, Chest x-ray does show some opacities that may be a pleural effusion in the right lower lobe -CT chest with lower lobe ground glass opacities, ? Infection -clinically improving, continue antibiotics and prednisone taper, nebs  2. NSTEMI -troponin peaked to 0.38,down to 0.2 -Has had atypical chest pain intermittently -2-D echocardiogram with questionable apical hypokinesis and question apical thrombus -continue IV heparin, appreciate cardiology input -Plan for left heart catheterization on Monday  3.  Embedded tick of upper back excluding scapular region:  -removed -continue doxycycline 100 mg p.o. twice daily for 10 days  5.  Essential hypertension:  -Continue her home medication which are amlodipine -cutdown lisinopril  6.  Hypercholesterolemia continue atorvastatin  7.  Chronic kidney disease stage III: Patient with a GFR of 39  with a creatinine of 1.31 which is stable compared to July 2018 -stop NSAIDs  8.  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  10.  Multiple areas of chronic pain: These include the low back, her postthoracotomy pain, chronic pain of the lower extremity, chronic hip pain, chronic sacroiliac pain, and chronic chest wall pain.  Is treated with Mobic at home.  Please note patient has generalized anxiety disorder is therefore at high risk of developing narcotic addiction.  We will treat very conservatively continue home medication. -continue Gabapentin  11.  History of uterine cancer patient takes Evista.  12.  Generalized anxiety disorder: She takes 5 mg of diazepam every evening.  He also takes sertraline daily which we will continue will continue same while hospitalized.  13.  Gastroesophageal reflux disease continue proton pump inhibitor.   DVT prophylaxis: Lovenox Code Status: Full code Family Communication: No family present. Disposition Plan: Home pending cardiac catheterization if stable   Procedures: ECHO  Antimicrobials:    Subjective: -feels better today, no complaints breathing improving, intermittent productive cough,  Objective: Vitals:   12/10/17 0005 12/10/17 0254 12/10/17 0821 12/10/17 1017  BP: 129/78  110/80 (!) 148/73  Pulse: 84  85   Resp: 19  18   Temp: 97.8 F (36.6 C)  (!) 97.5 F (36.4 C)   TempSrc: Oral  Oral   SpO2: 95%  97%   Weight:  75.6 kg (166 lb 10.7 oz)    Height:        Intake/Output Summary (Last 24 hours) at 12/10/2017 1237 Last data filed at 12/10/2017 1024 Gross  per 24 hour  Intake 768 ml  Output 600 ml  Net 168 ml   Filed Weights   12/08/17 1628 12/09/17 0500 12/10/17 0254  Weight: 71.1 kg (156 lb 12 oz) 73.3 kg (161 lb 9.6  oz) 75.6 kg (166 lb 10.7 oz)    Examination:  Gen: Awake, Alert, Oriented X 3, no distress HEENT: PERRLA, Neck supple, no JVD Lungs: poor air movement, scattered extremities CVS: S1S2/RRR Abd: soft, Non tender, non distended, BS present Extremities: No Cyanosis, Clubbing or edema Skin: no new rashes as well as 2/regular rate rhythm Psychiatry: Judgement and insight appear normal. Mood & affect appropriate.     Data Reviewed:   CBC: Recent Labs  Lab 12/08/17 0350 12/08/17 1104 12/09/17 0339 12/10/17 0256  WBC 8.7 6.7 11.0* 9.7  NEUTROABS 5.1  --   --   --   HGB 12.2 11.7* 10.5* 10.3*  HCT 38.3 37.5 33.6* 33.4*  MCV 88.5 87.8 90.1 91.0  PLT 275 296 231 242   Basic Metabolic Panel: Recent Labs  Lab 12/08/17 0350 12/08/17 1104 12/09/17 0339 12/10/17 0256  NA 139  --  142 140  K 3.9  --  4.8 4.4  CL 104  --  108 107  CO2 25  --  28 26  GLUCOSE 137*  --  88 91  BUN 11  --  13 12  CREATININE 1.31* 1.38* 1.18* 1.10*  CALCIUM 8.4*  --  8.3* 8.4*   GFR: Estimated Creatinine Clearance: 41.7 mL/min (A) (by C-G formula based on SCr of 1.1 mg/dL (H)). Liver Function Tests: Recent Labs  Lab 12/08/17 0350  AST 21  ALT 11*  ALKPHOS 58  BILITOT 0.4  PROT 6.6  ALBUMIN 3.1*   No results for input(s): LIPASE, AMYLASE in the last 168 hours. No results for input(s): AMMONIA in the last 168 hours. Coagulation Profile: Recent Labs  Lab 12/09/17 1521  INR 1.00   Cardiac Enzymes: Recent Labs  Lab 12/08/17 0350 12/08/17 1104 12/08/17 1629 12/08/17 2051  TROPONINI 0.06* 0.38* 0.33* 0.23*   BNP (last 3 results) No results for input(s): PROBNP in the last 8760 hours. HbA1C: No results for input(s): HGBA1C in the last 72 hours. CBG: Recent Labs  Lab 12/09/17 0718 12/09/17 1200 12/09/17 1629 12/09/17 2225 12/10/17 0730  GLUCAP 85 110* 144* 137* 95   Lipid Profile: No results for input(s): CHOL, HDL, LDLCALC, TRIG, CHOLHDL, LDLDIRECT in the last 72  hours. Thyroid Function Tests: No results for input(s): TSH, T4TOTAL, FREET4, T3FREE, THYROIDAB in the last 72 hours. Anemia Panel: No results for input(s): VITAMINB12, FOLATE, FERRITIN, TIBC, IRON, RETICCTPCT in the last 72 hours. Urine analysis:    Component Value Date/Time   COLORURINE YELLOW 12/08/2017 1646   APPEARANCEUR HAZY (A) 12/08/2017 1646   LABSPEC 1.010 12/08/2017 1646   PHURINE 5.0 12/08/2017 1646   GLUCOSEU NEGATIVE 12/08/2017 1646   HGBUR SMALL (A) 12/08/2017 1646   BILIRUBINUR NEGATIVE 12/08/2017 1646   KETONESUR NEGATIVE 12/08/2017 1646   PROTEINUR NEGATIVE 12/08/2017 1646   UROBILINOGEN 0.2 04/27/2015 1229   NITRITE POSITIVE (A) 12/08/2017 1646   LEUKOCYTESUR TRACE (A) 12/08/2017 1646   Sepsis Labs: @LABRCNTIP (procalcitonin:4,lacticidven:4)  ) Recent Results (from the past 240 hour(s))  Urine culture     Status: Abnormal (Preliminary result)   Collection Time: 12/08/17  4:33 PM  Result Value Ref Range Status   Specimen Description URINE, RANDOM  Final   Special Requests NONE  Final   Culture (A)  Final    >=  100,000 COLONIES/mL ESCHERICHIA COLI SUSCEPTIBILITIES TO FOLLOW Performed at [facility] [address] [place], [place] [address]    Report Status PENDING  Incomplete         Radiology Studies: No results found.      Scheduled Meds: . aspirin  81 mg Oral Daily  . atorvastatin  20 mg Oral Daily  . doxycycline  100 mg Oral Q12H  . gabapentin  400 mg Oral TID  . guaiFENesin  600 mg Oral BID  . ipratropium-albuterol  3 mL Nebulization TID  . lisinopril  5 mg Oral q morning - 10a  . magnesium oxide  200 mg Oral Daily  . metoCLOPramide  5 mg Oral BID  . metoprolol tartrate  12.5 mg Oral BID  . mometasone-formoterol  2 puff Inhalation BID  . pantoprazole  40 mg Oral Q0600  . predniSONE  40 mg Oral Q breakfast  . raloxifene  60 mg Oral Daily  . sertraline  100 mg Oral Daily  . sodium chloride flush  3 mL Intravenous Q12H    Continuous Infusions: . heparin 1,000 Units/hr (12/10/17 1023)     LOS: 2 days    Time spent: 79min    Domenic Polite, MD [facility] Page via [email or web address], password [email or web address] After 7PM please contact night-coverage  12/10/2017, 12:37 PM

## 2017-12-10 NOTE — Progress Notes (Signed)
  Echocardiogram 2D Echocardiogram Limited with definity has been performed.  Darlina Sicilian M 12/10/2017, 9:08 AM

## 2017-12-10 NOTE — Plan of Care (Signed)
Pt with increased activity today, ambulating in room without difficulty and maintaining o2 sats.  Pt denies pain today.  Is slightly anxious about her upcoming heart cath.

## 2017-12-10 NOTE — Progress Notes (Signed)
ANTICOAGULATION CONSULT NOTE - Follow Up Consult  Pharmacy Consult for heparin Indication: NSTEMI and possible apical thrombus  Labs: Recent Labs    12/08/17 1104 12/08/17 1629 12/08/17 2051 12/09/17 0339 12/09/17 1521 12/09/17 2329 12/10/17 0256 12/10/17 0723 12/10/17 1522  HGB 11.7*  --   --  10.5*  --   --  10.3*  --   --   HCT 37.5  --   --  33.6*  --   --  33.4*  --   --   PLT 296  --   --  231  --   --  224  --   --   LABPROT  --   --   --   --  13.1  --   --   --   --   INR  --   --   --   --  1.00  --   --   --   --   HEPARINUNFRC  --   --   --   --   --  0.31  --  0.28* <0.10*  CREATININE 1.38*  --   --  1.18*  --   --  1.10*  --   --   TROPONINI 0.38* 0.33* 0.23*  --   --   --   --   --   --     Assessment: 77yo female therapeutic on heparin with initial dosing for CP though at very low end of goal, now also w/ concern for apical thrombus and concern for apical thrombus, would prefer higher level; RN reports no gtt issues or signs of bleeding.  Heparin level < 0.10, RN reports heparin running correctly  Goal of Therapy:  Heparin level 0.3-0.7 units/ml   Plan:  Increase heparin to 1100 units / hr Follow up AM labs Planning cath tomorrow  Thank you Anette Guarneri, PharmD [phone]   12/10/2017,5:32 PM

## 2017-12-10 NOTE — Progress Notes (Signed)
ANTICOAGULATION CONSULT NOTE - Initial Consult  Pharmacy Consult for Heparin  Indication: chest pain/ACS  Allergies  Allergen Reactions  . Boniva [Ibandronate Sodium] Other (See Comments)    UNSPECIFIED REACTION   . Lyrica [Pregabalin] Nausea And Vomiting    Dizziness and blurred vision as well  . Nitrofurantoin Monohyd Macro Rash    Patient Measurements: Height: 5\' 1"  (154.9 cm) Weight: 166 lb 10.7 oz (75.6 kg) IBW/kg (Calculated) : 47.8 Heparin Dosing Weight: 63 kg  Vital Signs: Temp: 97.8 F (36.6 C) (05/19 0005) Temp Source: Oral (05/19 0005) BP: 129/78 (05/19 0005) Pulse Rate: 84 (05/19 0005)  Labs: Recent Labs    12/08/17 1104 12/08/17 1629 12/08/17 2051 12/09/17 0339 12/09/17 1521 12/09/17 2329 12/10/17 0256 12/10/17 0723  HGB 11.7*  --   --  10.5*  --   --  10.3*  --   HCT 37.5  --   --  33.6*  --   --  33.4*  --   PLT 296  --   --  231  --   --  224  --   LABPROT  --   --   --   --  13.1  --   --   --   INR  --   --   --   --  1.00  --   --   --   HEPARINUNFRC  --   --   --   --   --  0.31  --  0.28*  CREATININE 1.38*  --   --  1.18*  --   --  1.10*  --   TROPONINI 0.38* 0.33* 0.23*  --   --   --   --   --     Estimated Creatinine Clearance: 41.7 mL/min (A) (by C-G formula based on SCr of 1.1 mg/dL (H)).   Medical History: Past Medical History:  Diagnosis Date  . Adenocarcinoma of right lung, stage 1 ([place]) 07/28/2016  . Anxiety   . Cancer ([place])    uterine  . COPD (chronic obstructive pulmonary disease) ([place])   . Cyst    left side of neck  . Depression   . GERD (gastroesophageal reflux disease)   . Heart murmur    per patient  . Hyperlipidemia   . Hypertension   . Low back pain   . Lumbar back pain   . Osteoarthritis   . Osteoporosis     Medications:  Scheduled:  . amLODipine  5 mg Oral QHS  . aspirin  81 mg Oral Daily  . atorvastatin  20 mg Oral Daily  . doxycycline  100 mg Oral Q12H  . gabapentin  400 mg Oral TID  . guaiFENesin   600 mg Oral BID  . ipratropium-albuterol  3 mL Nebulization TID  . lisinopril  5 mg Oral q morning - 10a  . magnesium oxide  200 mg Oral Daily  . metoCLOPramide  5 mg Oral BID  . mometasone-formoterol  2 puff Inhalation BID  . pantoprazole  40 mg Oral Q0600  . predniSONE  40 mg Oral Q breakfast  . raloxifene  60 mg Oral Daily  . sertraline  100 mg Oral Daily  . sodium chloride flush  3 mL Intravenous Q12H    Assessment: 76 yof presented 5/17 with SOB, hypoxemia, and chest pain. Her troponins were elevated and new T was inversion seen on EKG. She was started on heparin 5/18 along with aspirin. Heparin was initially started for ACS,  now with concern for apical thrombus.   Heparin level this AM is slightly subtherapeutic at 0.28 on 850 units/hr. CBC remains stable.   Goal of Therapy:  Heparin level 0.3-0.7 units/ml Monitor platelets by anticoagulation protocol: Yes   Plan:  Heparin 950 unit bolus x1 Increase rate to heparin to 1000 units/hr IV infusion  Check HL in 8 hours Daily HL and CBC while on heparin F/U results of ECHO w/ contrast for r/o apical thrombus  Plans for cath Mon - Follow up   Demetrius Charity, PharmD PGY2 Oncology Pharmacy Resident  Pharmacy Phone: [phone] 12/10/2017

## 2017-12-10 NOTE — H&P (View-Only) (Signed)
Progress Note  Patient Name: [name] Date of Encounter: 12/10/2017  Primary Cardiologist: Dr Stanford Breed  Subjective   No chest pain or dyspnea  Inpatient Medications    Scheduled Meds: . amLODipine  5 mg Oral QHS  . aspirin  81 mg Oral Daily  . atorvastatin  20 mg Oral Daily  . doxycycline  100 mg Oral Q12H  . gabapentin  400 mg Oral TID  . guaiFENesin  600 mg Oral BID  . ipratropium-albuterol  3 mL Nebulization TID  . lisinopril  5 mg Oral q morning - 10a  . magnesium oxide  200 mg Oral Daily  . metoCLOPramide  5 mg Oral BID  . mometasone-formoterol  2 puff Inhalation BID  . pantoprazole  40 mg Oral Q0600  . predniSONE  40 mg Oral Q breakfast  . raloxifene  60 mg Oral Daily  . sertraline  100 mg Oral Daily  . sodium chloride flush  3 mL Intravenous Q12H   Continuous Infusions: . heparin 1,000 Units/hr (12/10/17 1023)   PRN Meds: acetaminophen **OR** acetaminophen, albuterol, diazepam, ondansetron **OR** ondansetron (ZOFRAN) IV, oxyCODONE, perflutren lipid microspheres (DEFINITY) IV suspension, polyethylene glycol   Vital Signs    Vitals:   12/10/17 0005 12/10/17 0254 12/10/17 0821 12/10/17 1017  BP: 129/78  110/80 (!) 148/73  Pulse: 84  85   Resp: 19     Temp: 97.8 F (36.6 C)  (!) 97.5 F (36.4 C)   TempSrc: Oral  Oral   SpO2: 95%  97%   Weight:  166 lb 10.7 oz (75.6 kg)    Height:        Intake/Output Summary (Last 24 hours) at 12/10/2017 1035 Last data filed at 12/10/2017 0008 Gross per 24 hour  Intake 518.5 ml  Output 600 ml  Net -81.5 ml   Filed Weights   12/08/17 1628 12/09/17 0500 12/10/17 0254  Weight: 156 lb 12 oz (71.1 kg) 161 lb 9.6 oz (73.3 kg) 166 lb 10.7 oz (75.6 kg)    Telemetry    sinus- Personally Reviewed  Physical Exam   GEN: No acute distress.   Neck: No JVD Cardiac: RRR, no murmurs, rubs, or gallops.  Respiratory: Diminished BS throughout GI: Soft, nontender, non-distended  MS: No edema Neuro:  Nonfocal  Psych:  Normal affect   Labs    Chemistry Recent Labs  Lab 12/08/17 0350 12/08/17 1104 12/09/17 0339 12/10/17 0256  NA 139  --  142 140  K 3.9  --  4.8 4.4  CL 104  --  108 107  CO2 25  --  28 26  GLUCOSE 137*  --  88 91  BUN 11  --  13 12  CREATININE 1.31* 1.38* 1.18* 1.10*  CALCIUM 8.4*  --  8.3* 8.4*  PROT 6.6  --   --   --   ALBUMIN 3.1*  --   --   --   AST 21  --   --   --   ALT 11*  --   --   --   ALKPHOS 58  --   --   --   BILITOT 0.4  --   --   --   GFRNONAA 39* 37* 44* 48*  GFRAA 45* 42* 51* 56*  ANIONGAP 10  --  6 7     Hematology Recent Labs  Lab 12/08/17 1104 12/09/17 0339 12/10/17 0256  WBC 6.7 11.0* 9.7  RBC 4.27 3.73* 3.67*  HGB 11.7* 10.5*  10.3*  HCT 37.5 33.6* 33.4*  MCV 87.8 90.1 91.0  MCH 27.4 28.2 28.1  MCHC 31.2 31.3 30.8  RDW 14.8 15.4 15.3  PLT 296 231 224    Cardiac Enzymes Recent Labs  Lab 12/08/17 0350 12/08/17 1104 12/08/17 1629 12/08/17 2051  TROPONINI 0.06* 0.38* 0.33* 0.23*    BNP Recent Labs  Lab 12/08/17 0350  BNP 116.2*     Patient Profile     77 y.o. female with past medical history of COPD, prior lung cancer resection, hypertension, hyperlipidemia admitted with dyspnea.  Patient ruled in for non-ST elevation myocardial infarction with troponin of 0.38.  Electrocardiogram showed new deep anterior T wave inversion.  Assessment & Plan    1. Non-ST elevation myocardial infarction-patient presented with dyspnea and chest tightness (pt denied CP to me). Her troponin is elevated and her electrocardiogram shows marked anterior T wave inversion which is new compared to admission.  This is concerning for LAD lesion.  Continue aspirin and heparin.  Continue statin.  I will add low dose metoprolol to see if she will tolerate from a pulmonary standpoint. She will require cardiac catheterization.  The risks and benefits including myocardial infarction, CVA and death discussed and she agrees to proceed.  Plan tomorrow. 2. Possible  apical thrombus-FU echo with contrast shows no thrombus. 3. COPD-management per primary service.  Continue bronchodilators and steroids. 4. Hypertension-discontinue amlodipine and add metoprolol given reduced LV function.  Continue ACE inhibitor.  Advance as tolerated. 5. Hyperlipidemia-continue statin. 6. Chronic stage III kidney disease-we will gently hydrate prior to catheterization.  Follow renal function after procedure.   7. Ischemic cardiomyopathy-LV function moderately reduced.  Add metoprolol as outlined to see if she tolerates and then changed to Toprol if she does.  Continue ACE inhibitor and increase as needed and tolerated.   For questions or updates, please contact [place] Please consult [email or web address] for contact info under Cardiology/STEMI.      Signed, Kirk Ruths, MD  12/10/2017, 10:35 AM

## 2017-12-10 NOTE — Progress Notes (Signed)
Progress Note  Patient Name: [name] Date of Encounter: 12/10/2017  Primary Cardiologist: Dr Stanford Breed  Subjective   No chest pain or dyspnea  Inpatient Medications    Scheduled Meds: . amLODipine  5 mg Oral QHS  . aspirin  81 mg Oral Daily  . atorvastatin  20 mg Oral Daily  . doxycycline  100 mg Oral Q12H  . gabapentin  400 mg Oral TID  . guaiFENesin  600 mg Oral BID  . ipratropium-albuterol  3 mL Nebulization TID  . lisinopril  5 mg Oral q morning - 10a  . magnesium oxide  200 mg Oral Daily  . metoCLOPramide  5 mg Oral BID  . mometasone-formoterol  2 puff Inhalation BID  . pantoprazole  40 mg Oral Q0600  . predniSONE  40 mg Oral Q breakfast  . raloxifene  60 mg Oral Daily  . sertraline  100 mg Oral Daily  . sodium chloride flush  3 mL Intravenous Q12H   Continuous Infusions: . heparin 1,000 Units/hr (12/10/17 1023)   PRN Meds: acetaminophen **OR** acetaminophen, albuterol, diazepam, ondansetron **OR** ondansetron (ZOFRAN) IV, oxyCODONE, perflutren lipid microspheres (DEFINITY) IV suspension, polyethylene glycol   Vital Signs    Vitals:   12/10/17 0005 12/10/17 0254 12/10/17 0821 12/10/17 1017  BP: 129/78  110/80 (!) 148/73  Pulse: 84  85   Resp: 19     Temp: 97.8 F (36.6 C)  (!) 97.5 F (36.4 C)   TempSrc: Oral  Oral   SpO2: 95%  97%   Weight:  166 lb 10.7 oz (75.6 kg)    Height:        Intake/Output Summary (Last 24 hours) at 12/10/2017 1035 Last data filed at 12/10/2017 0008 Gross per 24 hour  Intake 518.5 ml  Output 600 ml  Net -81.5 ml   Filed Weights   12/08/17 1628 12/09/17 0500 12/10/17 0254  Weight: 156 lb 12 oz (71.1 kg) 161 lb 9.6 oz (73.3 kg) 166 lb 10.7 oz (75.6 kg)    Telemetry    sinus- Personally Reviewed  Physical Exam   GEN: No acute distress.   Neck: No JVD Cardiac: RRR, no murmurs, rubs, or gallops.  Respiratory: Diminished BS throughout GI: Soft, nontender, non-distended  MS: No edema Neuro:  Nonfocal  Psych:  Normal affect   Labs    Chemistry Recent Labs  Lab 12/08/17 0350 12/08/17 1104 12/09/17 0339 12/10/17 0256  NA 139  --  142 140  K 3.9  --  4.8 4.4  CL 104  --  108 107  CO2 25  --  28 26  GLUCOSE 137*  --  88 91  BUN 11  --  13 12  CREATININE 1.31* 1.38* 1.18* 1.10*  CALCIUM 8.4*  --  8.3* 8.4*  PROT 6.6  --   --   --   ALBUMIN 3.1*  --   --   --   AST 21  --   --   --   ALT 11*  --   --   --   ALKPHOS 58  --   --   --   BILITOT 0.4  --   --   --   GFRNONAA 39* 37* 44* 48*  GFRAA 45* 42* 51* 56*  ANIONGAP 10  --  6 7     Hematology Recent Labs  Lab 12/08/17 1104 12/09/17 0339 12/10/17 0256  WBC 6.7 11.0* 9.7  RBC 4.27 3.73* 3.67*  HGB 11.7* 10.5*  10.3*  HCT 37.5 33.6* 33.4*  MCV 87.8 90.1 91.0  MCH 27.4 28.2 28.1  MCHC 31.2 31.3 30.8  RDW 14.8 15.4 15.3  PLT 296 231 224    Cardiac Enzymes Recent Labs  Lab 12/08/17 0350 12/08/17 1104 12/08/17 1629 12/08/17 2051  TROPONINI 0.06* 0.38* 0.33* 0.23*    BNP Recent Labs  Lab 12/08/17 0350  BNP 116.2*     Patient Profile     77 y.o. female with past medical history of COPD, prior lung cancer resection, hypertension, hyperlipidemia admitted with dyspnea.  Patient ruled in for non-ST elevation myocardial infarction with troponin of 0.38.  Electrocardiogram showed new deep anterior T wave inversion.  Assessment & Plan    1. Non-ST elevation myocardial infarction-patient presented with dyspnea and chest tightness (pt denied CP to me). Her troponin is elevated and her electrocardiogram shows marked anterior T wave inversion which is new compared to admission.  This is concerning for LAD lesion.  Continue aspirin and heparin.  Continue statin.  I will add low dose metoprolol to see if she will tolerate from a pulmonary standpoint. She will require cardiac catheterization.  The risks and benefits including myocardial infarction, CVA and death discussed and she agrees to proceed.  Plan tomorrow. 2. Possible  apical thrombus-FU echo with contrast shows no thrombus. 3. COPD-management per primary service.  Continue bronchodilators and steroids. 4. Hypertension-discontinue amlodipine and add metoprolol given reduced LV function.  Continue ACE inhibitor.  Advance as tolerated. 5. Hyperlipidemia-continue statin. 6. Chronic stage III kidney disease-we will gently hydrate prior to catheterization.  Follow renal function after procedure.   7. Ischemic cardiomyopathy-LV function moderately reduced.  Add metoprolol as outlined to see if she tolerates and then changed to Toprol if she does.  Continue ACE inhibitor and increase as needed and tolerated.   For questions or updates, please contact [place] Please consult [email or web address] for contact info under Cardiology/STEMI.      Signed, Kirk Ruths, MD  12/10/2017, 10:35 AM

## 2017-12-10 NOTE — Progress Notes (Signed)
ANTICOAGULATION CONSULT NOTE - Follow Up Consult  Pharmacy Consult for heparin Indication: NSTEMI and possible apical thrombus  Labs: Recent Labs    12/08/17 0350 12/08/17 1104 12/08/17 1629 12/08/17 2051 12/09/17 0339 12/09/17 1521 12/09/17 2329  HGB 12.2 11.7*  --   --  10.5*  --   --   HCT 38.3 37.5  --   --  33.6*  --   --   PLT 275 296  --   --  231  --   --   LABPROT  --   --   --   --   --  13.1  --   INR  --   --   --   --   --  1.00  --   HEPARINUNFRC  --   --   --   --   --   --  0.31  CREATININE 1.31* 1.38*  --   --  1.18*  --   --   TROPONINI 0.06* 0.38* 0.33* 0.23*  --   --   --     Assessment: 77yo female therapeutic on heparin with initial dosing for CP though at very low end of goal, now also w/ concern for apical thrombus and concern for apical thrombus, would prefer higher level; RN reports no gtt issues or signs of bleeding.  Goal of Therapy:  Heparin level 0.3-0.7 units/ml   Plan:  Will increase heparin gtt by ~1 units/kg/hr to 850 units/hr and check level in 8 hours.    Wynona Neat, PharmD, BCPS  12/10/2017,12:03 AM

## 2017-12-11 ENCOUNTER — Inpatient Hospital Stay (HOSPITAL_COMMUNITY)
Admission: EM | Disposition: A | Discharge status: Home / Self Care | Payer: Self-pay | Source: Home / Self Care | Attending: Internal Medicine

## 2017-12-11 ENCOUNTER — Encounter (HOSPITAL_COMMUNITY): Payer: Self-pay | Admitting: Interventional Cardiology

## 2017-12-11 DIAGNOSIS — I5021 Acute systolic (congestive) heart failure: Secondary | ICD-10-CM

## 2017-12-11 DIAGNOSIS — R748 Abnormal levels of other serum enzymes: Secondary | ICD-10-CM

## 2017-12-11 HISTORY — PX: LEFT HEART CATH AND CORONARY ANGIOGRAPHY: CATH118249

## 2017-12-11 LAB — URINE CULTURE: Culture: >=100000 — AB

## 2017-12-11 LAB — BASIC METABOLIC PANEL
Anion gap: 8 (ref 5–15)
BUN: 12 mg/dL (ref 6–20)
CO2: 27 mmol/L (ref 22–32)
Calcium: 8.7 mg/dL — ABNORMAL LOW (ref 8.9–10.3)
Chloride: 106 mmol/L (ref 101–111)
Creatinine, Ser: 1.17 mg/dL — ABNORMAL HIGH (ref 0.44–1.00)
GFR calc Af Amer: 52 mL/min — ABNORMAL LOW (ref >60)
GFR calc non Af Amer: 45 mL/min — ABNORMAL LOW (ref >60)
Glucose, Bld: 86 mg/dL (ref 65–99)
Potassium: 4.3 mmol/L (ref 3.5–5.1)
Sodium: 141 mmol/L (ref 135–145)

## 2017-12-11 LAB — CBC
HCT: 34.9 % — ABNORMAL LOW (ref 36.0–46.0)
Hemoglobin: 10.8 g/dL — ABNORMAL LOW (ref 12.0–15.0)
MCH: 27.8 pg (ref 26.0–34.0)
MCHC: 30.9 g/dL (ref 30.0–36.0)
MCV: 89.7 fL (ref 78.0–100.0)
Platelets: 239 10*3/uL (ref 150–400)
RBC: 3.89 MIL/uL (ref 3.87–5.11)
RDW: 15.5 % (ref 11.5–15.5)
WBC: 10.8 10*3/uL — ABNORMAL HIGH (ref 4.0–10.5)

## 2017-12-11 LAB — HEPARIN LEVEL (UNFRACTIONATED): Heparin Unfractionated: 0.4 IU/mL (ref 0.30–0.70)

## 2017-12-11 SURGERY — LEFT HEART CATH AND CORONARY ANGIOGRAPHY
Anesthesia: LOCAL

## 2017-12-11 MED ORDER — VERAPAMIL HCL 2.5 MG/ML IV SOLN
INTRAVENOUS | Status: DC | PRN
  Administered 2017-12-11: 10 mL via INTRA_ARTERIAL

## 2017-12-11 MED ORDER — MIDAZOLAM HCL 2 MG/2ML IJ SOLN
INTRAMUSCULAR | Status: DC | PRN
  Administered 2017-12-11: 2 mg via INTRAVENOUS
  Administered 2017-12-11: 1 mg via INTRAVENOUS

## 2017-12-11 MED ORDER — HEPARIN SODIUM (PORCINE) 1000 UNIT/ML IJ SOLN
INTRAMUSCULAR | Status: AC
  Filled 2017-12-11: qty 1

## 2017-12-11 MED ORDER — IOPAMIDOL (ISOVUE-370) INJECTION 76%
INTRAVENOUS | Status: DC | PRN
  Administered 2017-12-11: 50 mL via INTRAVENOUS

## 2017-12-11 MED ORDER — ACETAMINOPHEN 325 MG PO TABS: 650.0000 mg | ORAL_TABLET | ORAL | Status: DC | PRN

## 2017-12-11 MED ORDER — HEPARIN SODIUM (PORCINE) 1000 UNIT/ML IJ SOLN
INTRAMUSCULAR | Status: DC | PRN
  Administered 2017-12-11: 4000 [IU] via INTRAVENOUS

## 2017-12-11 MED ORDER — LIDOCAINE HCL (PF) 1 % IJ SOLN
INTRAMUSCULAR | Status: AC
  Filled 2017-12-11: qty 30

## 2017-12-11 MED ORDER — FENTANYL CITRATE (PF) 100 MCG/2ML IJ SOLN
INTRAMUSCULAR | Status: AC
  Filled 2017-12-11: qty 2

## 2017-12-11 MED ORDER — IOPAMIDOL (ISOVUE-370) INJECTION 76%
INTRAVENOUS | Status: AC
  Filled 2017-12-11: qty 100

## 2017-12-11 MED ORDER — ONDANSETRON HCL 4 MG/2ML IJ SOLN: 4.0000 mg | Freq: Four times a day (QID) | INTRAMUSCULAR | Status: DC | PRN

## 2017-12-11 MED ORDER — LIDOCAINE HCL (PF) 1 % IJ SOLN
INTRAMUSCULAR | Status: DC | PRN
  Administered 2017-12-11: 2 mL via SUBCUTANEOUS

## 2017-12-11 MED ORDER — VERAPAMIL HCL 2.5 MG/ML IV SOLN
INTRAVENOUS | Status: AC
  Filled 2017-12-11: qty 2

## 2017-12-11 MED ORDER — HEPARIN (PORCINE) IN NACL 1000-0.9 UT/500ML-% IV SOLN
INTRAVENOUS | Status: AC
  Filled 2017-12-11: qty 1000

## 2017-12-11 MED ORDER — FENTANYL CITRATE (PF) 100 MCG/2ML IJ SOLN
INTRAMUSCULAR | Status: DC | PRN
  Administered 2017-12-11 (×2): 25 ug via INTRAVENOUS

## 2017-12-11 MED ORDER — SODIUM CHLORIDE 0.9 % IV SOLN: 250.0000 mL | INTRAVENOUS | Status: DC | PRN

## 2017-12-11 MED ORDER — MIDAZOLAM HCL 2 MG/2ML IJ SOLN
INTRAMUSCULAR | Status: AC
  Filled 2017-12-11: qty 2

## 2017-12-11 MED ORDER — HEPARIN (PORCINE) IN NACL 2-0.9 UNITS/ML
INTRAMUSCULAR | Status: AC | PRN
  Administered 2017-12-11 (×2): 500 mL

## 2017-12-11 MED ORDER — SODIUM CHLORIDE 0.9% FLUSH: 3.0000 mL | INTRAVENOUS | Status: DC | PRN

## 2017-12-11 MED ORDER — SODIUM CHLORIDE 0.9% FLUSH
3.0000 mL | Freq: Two times a day (BID) | INTRAVENOUS | Status: DC
  Administered 2017-12-11 – 2017-12-12 (×2): 3 mL via INTRAVENOUS

## 2017-12-11 SURGICAL SUPPLY — 16 items
BAND CMPR LRG ZPHR (HEMOSTASIS) ×1
BAND ZEPHYR COMPRESS 30 LONG (HEMOSTASIS) ×1 IMPLANT
CATH INFINITI 5 FR JL3.5 (CATHETERS) ×1 IMPLANT
CATH INFINITI JR4 5F (CATHETERS) ×1 IMPLANT
COVER PRB 48X5XTLSCP FOLD TPE (BAG) IMPLANT
COVER PROBE 5X48 (BAG) ×2
GUIDEWIRE INQWIRE 1.5J.035X260 (WIRE) IMPLANT
INQWIRE 1.5J .035X260CM (WIRE) ×2
KIT HEART LEFT (KITS) ×2 IMPLANT
NDL PERC 21GX4CM (NEEDLE) IMPLANT
NEEDLE PERC 21GX4CM (NEEDLE) ×2 IMPLANT
PACK CARDIAC CATHETERIZATION (CUSTOM PROCEDURE TRAY) ×2 IMPLANT
SHEATH RAIN RADIAL 21G 6FR (SHEATH) ×1 IMPLANT
TRANSDUCER W/STOPCOCK (MISCELLANEOUS) ×2 IMPLANT
TUBING CIL FLEX 10 FLL-RA (TUBING) ×2 IMPLANT
WIRE HI TORQ VERSACORE-J 145CM (WIRE) ×1 IMPLANT

## 2017-12-11 NOTE — Interval H&P Note (Signed)
Cath Lab Visit (complete for each Cath Lab visit)  Clinical Evaluation Leading to the Procedure:   ACS: Yes.    Non-ACS:    Anginal Classification: CCS IV  Anti-ischemic medical therapy: Minimal Therapy (1 class of medications)  Non-Invasive Test Results: No non-invasive testing performed  Prior CABG: No previous CABG      History and Physical Interval Note:  12/11/2017 7:51 AM  [name]  has presented today for surgery, with the diagnosis of NSTEMI  The various methods of treatment have been discussed with the patient and family. After consideration of risks, benefits and other options for treatment, the patient has consented to  Procedure(s): LEFT HEART CATH AND CORONARY ANGIOGRAPHY (N/A) as a surgical intervention .  The patient's history has been reviewed, patient examined, no change in status, stable for surgery.  I have reviewed the patient's chart and labs.  Questions were answered to the patient's satisfaction.     Larae Grooms

## 2017-12-11 NOTE — Progress Notes (Signed)
PROGRESS NOTE    [name]  EXH:[id number] DOB: [birth year] DOA: 12/08/2017 PCP: Wenda Low, MD  Brief Narrative:[name] is a 77 y.o. 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 -and improved with treatment for COPD flare, however also noted to have elevated troponin and abnormal echocardiogram  Assessment & Plan:   1.  Acute exacerbation of chronic obstructive pulmonary disease:  -noted to have significant amount of wheezing and coughing associated with an acute exacerbation of COPD, Chest x-ray does show some opacities that may be a pleural effusion in the right lower lobe -CT chest with lower lobe ground glass opacities, ? Infection -clinically improving, continue antibiotics and prednisone taper, nebs  2. NSTEMI -troponin peaked to 0.38,down to 0.2 -Has had atypical chest pain intermittently -2-D echocardiogram with questionable apical hypokinesis and question apical thrombus, repeat ECHO with contrast without Thrombus -Cards following, for LHC today  3.  Embedded tick of upper back excluding scapular region:  -removed -continue doxycycline 100 mg p.o. twice daily for 8 more days  5.  Essential hypertension:  -Continue her home medication which are amlodipine -cutdown lisinopril  6.  Hypercholesterolemia continue atorvastatin  7.  Chronic kidney disease stage III: Patient with a GFR of 39 with a creatinine  of 1.31 which is stable compared to July 2018 -stop NSAIDs  8.  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  10.  Multiple areas of chronic pain: These include the low back, her postthoracotomy pain, chronic pain of the lower extremity, chronic hip pain, chronic sacroiliac pain, and chronic chest wall pain.  Is treated with Mobic at home.  Please note patient has generalized anxiety disorder is therefore at high risk of developing narcotic addiction.  We will treat very conservatively continue home medication. -continue Gabapentin  11.  History of uterine cancer patient takes Evista.  12.  Generalized anxiety disorder: She takes 5 mg of diazepam every evening.  He also takes sertraline daily which we will continue will continue same while hospitalized.  13.  Gastroesophageal reflux disease continue proton pump inhibitor.   DVT prophylaxis: Lovenox Code Status: Full code Family Communication: No family present. Disposition Plan: Home pending cardiac catheterization if stable   Procedures: ECHO  Antimicrobials:    Subjective: -feels better today, no complaints breathing improving, intermittent productive cough,  Objective: Vitals:   12/11/17 1110 12/11/17 1125 12/11/17 1140 12/11/17 1326  BP: (!) 148/99 (!) 153/60 (!) 138/58   Pulse: 64 62 (!) 59   Resp: 16 18 15    Temp:      TempSrc:      SpO2: 92% 93% 92% 95%  Weight:      Height:        Intake/Output Summary (Last 24 hours) at 12/11/2017 1329 Last data filed at 12/10/2017 2300 Gross per 24 hour  Intake 696.85 ml  Output -  Net 696.85 ml   Filed Weights   12/09/17 0500 12/10/17 0254 12/11/17 0500  Weight: 73.3 kg (161 lb 9.6 oz) 75.6 kg (166 lb 10.7 oz) 73 kg (160 lb 15 oz)    Examination:  Gen: Awake, Alert,  Oriented X 3,  HEENT: PERRLA, Neck supple, no JVD Lungs: Good air movement bilaterally, CTAB CVS: RRR,No Gallops,Rubs or new Murmurs Abd: soft, Non tender, non distended, BS present Extremities: No Cyanosis, Clubbing or edema Skin: no new rashes Psychiatry: Judgement and insight appear normal. Mood & affect appropriate.     Data Reviewed:   CBC: Recent Labs  Lab 12/08/17 0350 12/08/17 1104 12/09/17 0339 12/10/17 0256 12/11/17 0402  WBC 8.7 6.7 11.0* 9.7 10.8*  NEUTROABS 5.1  --   --   --   --   HGB 12.2 11.7* 10.5* 10.3* 10.8*  HCT 38.3 37.5 33.6* 33.4* 34.9*  MCV 88.5 87.8 90.1 91.0 89.7  PLT 275 296 231 224 102   Basic Metabolic Panel: Recent Labs  Lab 12/08/17 0350 12/08/17 1104 12/09/17 0339 12/10/17 0256 12/11/17 0402  NA 139  --  142 140 141  K 3.9  --  4.8 4.4 4.3  CL 104  --  108 107 106  CO2 25  --  28 26 27   GLUCOSE 137*  --  88 91 86  BUN 11  --  13 12 12   CREATININE 1.31* 1.38* 1.18* 1.10* 1.17*  CALCIUM 8.4*  --  8.3* 8.4* 8.7*   GFR: Estimated Creatinine Clearance: 38.6 mL/min (A) (by C-G formula based on SCr of 1.17 mg/dL (H)). Liver Function Tests: Recent Labs  Lab 12/08/17 0350  AST 21  ALT 11*  ALKPHOS 58  BILITOT 0.4  PROT 6.6  ALBUMIN 3.1*   No results for input(s): LIPASE, AMYLASE in the last 168 hours. No results for input(s): AMMONIA in the last 168 hours. Coagulation Profile: Recent Labs  Lab 12/09/17 1521  INR 1.00   Cardiac Enzymes: Recent Labs  Lab 12/08/17 0350 12/08/17 1104 12/08/17 1629 12/08/17 2051  TROPONINI 0.06* 0.38* 0.33* 0.23*   BNP (last 3 results) No results for input(s): PROBNP in the last 8760 hours. HbA1C: No results for input(s): HGBA1C in the last 72 hours. CBG: Recent Labs  Lab 12/09/17 0718 12/09/17 1200 12/09/17 1629 12/09/17 2225 12/10/17 0730  GLUCAP 85 110* 144* 137* 95   Lipid Profile: No results for input(s): CHOL, HDL, LDLCALC, TRIG, CHOLHDL, LDLDIRECT in the last 72  hours. Thyroid Function Tests: No results for input(s): TSH, T4TOTAL, FREET4, T3FREE, THYROIDAB in the last 72 hours. Anemia Panel: No results for input(s): VITAMINB12, FOLATE, FERRITIN, TIBC, IRON, RETICCTPCT in the last 72 hours. Urine analysis:    Component Value Date/Time   COLORURINE YELLOW 12/08/2017 1646   APPEARANCEUR HAZY (A) 12/08/2017 1646   LABSPEC 1.010 12/08/2017 1646   PHURINE 5.0 12/08/2017 1646   GLUCOSEU NEGATIVE 12/08/2017 1646   HGBUR SMALL (A) 12/08/2017 1646   BILIRUBINUR NEGATIVE 12/08/2017 1646   KETONESUR NEGATIVE 12/08/2017 1646   PROTEINUR NEGATIVE 12/08/2017 1646   UROBILINOGEN 0.2 04/27/2015 1229   NITRITE POSITIVE (A) 12/08/2017 1646   LEUKOCYTESUR TRACE (A) 12/08/2017 1646   Sepsis Labs: @LABRCNTIP (procalcitonin:4,lacticidven:4)  ) Recent Results (from the past 240 hour(s))  Urine culture     Status: Abnormal   Collection Time: 12/08/17  4:33 PM  Result Value Ref Range Status   Specimen Description URINE, RANDOM  Final   Special Requests   Final  NONE Performed at [facility], [place] [address], [place], [place] [address]    Culture >=100,000 COLONIES/mL ESCHERICHIA COLI (A)  Final   Report Status 12/11/2017 FINAL  Final   Organism ID, Bacteria ESCHERICHIA COLI (A)  Final      Susceptibility   Escherichia coli - MIC*    AMPICILLIN <=2 SENSITIVE Sensitive     CEFAZOLIN <=4 SENSITIVE Sensitive     CEFTRIAXONE <=1 SENSITIVE Sensitive     CIPROFLOXACIN <=0.25 SENSITIVE Sensitive     GENTAMICIN <=1 SENSITIVE Sensitive     IMIPENEM <=0.25 SENSITIVE Sensitive     NITROFURANTOIN <=16 SENSITIVE Sensitive     TRIMETH/SULFA <=20 SENSITIVE Sensitive     AMPICILLIN/SULBACTAM <=2 SENSITIVE Sensitive     PIP/TAZO <=4 SENSITIVE Sensitive     Extended ESBL NEGATIVE Sensitive     * >=100,000 COLONIES/mL ESCHERICHIA COLI         Radiology Studies: No results found.      Scheduled Meds: . aspirin  81 mg Oral Daily  . atorvastatin  20  mg Oral Daily  . doxycycline  100 mg Oral Q12H  . gabapentin  400 mg Oral TID  . guaiFENesin  600 mg Oral BID  . ipratropium-albuterol  3 mL Nebulization TID  . lisinopril  5 mg Oral q morning - 10a  . magnesium oxide  200 mg Oral Daily  . metoCLOPramide  5 mg Oral BID  . metoprolol tartrate  12.5 mg Oral BID  . mometasone-formoterol  2 puff Inhalation BID  . pantoprazole  40 mg Oral Q0600  . predniSONE  40 mg Oral Q breakfast  . raloxifene  60 mg Oral Daily  . sertraline  100 mg Oral Daily  . sodium chloride flush  3 mL Intravenous Q12H  . sodium chloride flush  3 mL Intravenous Q12H   Continuous Infusions: . sodium chloride       LOS: 3 days    Time spent: 43min    Domenic Polite, MD [facility] Page via [email or web address], password [email or web address] After 7PM please contact night-coverage  12/11/2017, 1:29 PM

## 2017-12-11 NOTE — Progress Notes (Signed)
RT paged to come and administer breathing treatment for expiratory wheezing

## 2017-12-11 NOTE — Progress Notes (Signed)
TR BAND REMOVAL  LOCATION:    Radial rt radial  DEFLATED PER PROTOCOL:   yes  TIME BAND OFF / DRESSING APPLIED:    1145/gauze and tegaderm  SITE UPON ARRIVAL:    Level 1  SITE AFTER BAND REMOVAL:    Level 1; bruised and puffy, soft w/o hematoma  CIRCULATION SENSATION AND MOVEMENT:    Within Normal Limits :  Yes  COMMENTS:

## 2017-12-12 DIAGNOSIS — J441 Chronic obstructive pulmonary disease with (acute) exacerbation: Secondary | ICD-10-CM

## 2017-12-12 DIAGNOSIS — I10 Essential (primary) hypertension: Secondary | ICD-10-CM

## 2017-12-12 LAB — BASIC METABOLIC PANEL
Anion gap: 9 (ref 5–15)
BUN: 14 mg/dL (ref 6–20)
CO2: 26 mmol/L (ref 22–32)
Calcium: 8.9 mg/dL (ref 8.9–10.3)
Chloride: 102 mmol/L (ref 101–111)
Creatinine, Ser: 1.21 mg/dL — ABNORMAL HIGH (ref 0.44–1.00)
GFR calc Af Amer: 50 mL/min — ABNORMAL LOW (ref >60)
GFR calc non Af Amer: 43 mL/min — ABNORMAL LOW (ref >60)
Glucose, Bld: 108 mg/dL — ABNORMAL HIGH (ref 65–99)
Potassium: 4.1 mmol/L (ref 3.5–5.1)
Sodium: 137 mmol/L (ref 135–145)

## 2017-12-12 LAB — CBC
HCT: 36.5 % (ref 36.0–46.0)
Hemoglobin: 11.4 g/dL — ABNORMAL LOW (ref 12.0–15.0)
MCH: 28 pg (ref 26.0–34.0)
MCHC: 31.2 g/dL (ref 30.0–36.0)
MCV: 89.7 fL (ref 78.0–100.0)
Platelets: 253 10*3/uL (ref 150–400)
RBC: 4.07 MIL/uL (ref 3.87–5.11)
RDW: 15.7 % — ABNORMAL HIGH (ref 11.5–15.5)
WBC: 10 10*3/uL (ref 4.0–10.5)

## 2017-12-12 LAB — GLUCOSE, CAPILLARY: Glucose-Capillary: 90 mg/dL (ref 65–99)

## 2017-12-12 MED ORDER — DOXYCYCLINE HYCLATE 100 MG PO TABS: 100.0000 mg | ORAL_TABLET | Freq: Two times a day (BID) | ORAL | 0 refills | Status: DC

## 2017-12-12 MED ORDER — LISINOPRIL 10 MG PO TABS: 10.0000 mg | ORAL_TABLET | Freq: Every morning | ORAL | 0 refills | Status: DC

## 2017-12-12 MED ORDER — GABAPENTIN 300 MG PO CAPS: 300.0000 mg | ORAL_CAPSULE | Freq: Three times a day (TID) | ORAL | 0 refills | Status: DC

## 2017-12-12 MED ORDER — METOPROLOL TARTRATE 25 MG PO TABS: 12.5000 mg | ORAL_TABLET | Freq: Two times a day (BID) | ORAL | 0 refills | Status: DC

## 2017-12-12 MED FILL — Heparin Sod (Porcine)-NaCl IV Soln 1000 Unit/500ML-0.9%: INTRAVENOUS | Qty: 1000 | Status: AC

## 2017-12-12 NOTE — Discharge Summary (Signed)
Physician Discharge Summary  ANALIZ TVEDT ZOX:[id number] DOB: October 03, 1940 DOA: 12/08/2017  PCP: Wenda Low, MD  Admit date: 12/08/2017 Discharge date: 12/12/2017  Time spent: 35 minutes  Recommendations for Outpatient Follow-up:  1. PCP in1  Week 2. 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%   Essential hypertension   Hypercholesterolemia   Chronic low back pain   CKD (chronic kidney disease), stage III (HCC)   S/P lobectomy of lung   Adenocarcinoma of right lung, stage 1 (HCC)   Neuropathic pain syndrome (non-herpetic)   Chronic post-thoracotomy pain   Chronic pain of lower extremity (Tertiary Area of Pain) (B (R>L)   Chronic hip pain (Fourth Area of Pain) (Bilateral) (R>L)   Chronic sacroiliac joint pain   Chronic chest wall pain(Primary Area of Pain) (R)   History of uterine cancer   Generalized anxiety disorder   GERD (gastroesophageal reflux disease)   Substernal chest pain   Elevated brain natriuretic peptide (BNP) level   Elevated troponin   Embedded tick of upper back excluding scapular region   Non-ST elevation (NSTEMI) myocardial infarction [facility])   Acute systolic heart failure ([place])   Discharge Condition: stable  Diet recommendation: low sodium  Filed Weights   12/09/17 0500 12/10/17 0254 12/11/17 0500  Weight: 73.3 kg (161 lb 9.6 oz) 75.6 kg (166 lb 10.7 oz) 73 kg (160 lb 15 oz)    History of present illness:  Felicia Hiebert Cardwellis a 77 y.o.femalewith medical history significant oflung cancer status post wedge resection of the right upper lobe for adenocarcinoma stage I in October 2017 by video-assisted thoracoscopy, COPD on no home oxygen, history of uterine cancer in 1981, gastroesophageal reflux disease, 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  Hospital Course:   Acute exacerbation of chronic obstructive pulmonary disease: -noted to have significant amount of wheezing and coughing associated with an acute exacerbation of COPD, Chest x-ray does show some opacities that may be a pleural effusion in the right lower lobe -CT chest with lower lobe ground glass opacities, ? Infection -clinically improving, continue antibiotics and prednisone taper, nebs  2. 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 Embedded tick of upper back excluding scapular region:  -removed -continue doxycycline 100 mg p.o. twice daily for 4 more days  5.Essential hypertension:  -Continue her home medication which are amlodipine -cutdown lisinopril  6. Hypercholesterolemiacontinue atorvastatin  7.Chronic kidney disease stage III: Patient with a GFR of 39 with a creatinine of 1.31 which is stable compared to July 2018 -stopped NSAIDs  8. 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  10. Multiple areas of chronic pain:These include  the low back, her postthoracotomy pain, chronic pain of the lower extremity, chronic hip pain, chronic  sacroiliac pain, and chronic chest wall pain. Is treated with Mobic at home. Please note patient has generalized anxiety disorder is therefore at high risk of developing narcotic addiction. We will treat very conservatively continue home medication. -continue Gabapentin  11. History of uterine cancerpatient takes Evista.  12. Generalized anxiety disorder: She takes 5 mg of diazepam every evening. He also takes sertraline daily which we continued  13. Gastroesophageal reflux disease continue proton pump inhibitor    Procedures:  Left heart cath:   Consultations:  Cards  Discharge Exam: Vitals:   12/12/17 0731 12/12/17 0752  BP: (!) 145/98   Pulse: 73   Resp: 16   Temp: 97.7 F (36.5 C)   SpO2: 95% 95%    General: AAOx3 Cardiovascular: S1S2/RRR Respiratory: CTAB  Discharge Instructions   Discharge Instructions    Diet - low sodium heart healthy   Complete by:  As directed    Increase activity slowly   Complete by:  As directed      Allergies as of 12/12/2017      Reactions   Boniva [ibandronate Sodium] Other (See Comments)   UNSPECIFIED REACTION    Lyrica [pregabalin] Nausea And Vomiting   Dizziness and blurred vision as well   Nitrofurantoin Monohyd Macro Rash      Medication List    STOP taking these medications   amLODipine 5 MG tablet Commonly known as:  NORVASC   meloxicam 7.5 MG tablet Commonly known as:  MOBIC     TAKE these medications   albuterol 108 (90 Base) MCG/ACT inhaler Commonly known as:  PROVENTIL HFA;VENTOLIN HFA Inhale 2 puffs into the lungs every 6 (six) hours as needed. What changed:    when to take this  reasons to take this   albuterol (2.5 MG/3ML) 0.083% nebulizer solution Commonly known as:  PROVENTIL Take 3 mLs (2.5 mg total) by nebulization every 2 (two) hours as needed for wheezing or shortness of breath. What changed:  Another medication with the same name was changed. Make sure you understand how and  when to take each.   aluminum-magnesium hydroxide-simethicone [phone] MG/5ML Susp Commonly known as:  MAALOX Take 30 mLs by mouth as needed (indigestion).   atorvastatin 20 MG tablet Commonly known as:  LIPITOR Take 20 mg by mouth daily.   BERRI-FREEZ PAIN RELIEVING 10 % Liqd Generic drug:  Menthol (Topical Analgesic) Apply 1 application topically as needed (pain).   diazepam 5 MG tablet Commonly known as:  VALIUM Take 2.5 mg by mouth every 8 (eight) hours as needed for anxiety (for nerves). Must last 30 days. Filled 11-28-17   doxycycline 100 MG tablet Commonly known as:  VIBRA-TABS Take 1 tablet (100 mg total) by mouth every 12 (twelve) hours. For 4 days   Fluticasone-Salmeterol 250-50 MCG/DOSE Aepb Commonly known as:  ADVAIR DISKUS Inhale 1 puff into the lungs every 12 (twelve) hours.   gabapentin 300 MG capsule Commonly known as:  NEURONTIN Take 1 capsule (300 mg total) by mouth 3 (three) times daily. What changed:  how much to take   GLUCOSAMINE PO Take 1 tablet by mouth daily.   lisinopril 10 MG tablet Commonly known as:  PRINIVIL,ZESTRIL Take 1 tablet (10 mg total) by mouth every morning. What changed:    medication strength  how much to take   Magnesium 250 MG Tabs Take 250 mg by mouth daily.   metoCLOPramide  5 MG tablet Commonly known as:  REGLAN Take 5 mg by mouth 2 (two) times daily.   metoprolol tartrate 25 MG tablet Commonly known as:  LOPRESSOR Take 0.5 tablets (12.5 mg total) by mouth 2 (two) times daily.   pantoprazole 40 MG tablet Commonly known as:  PROTONIX Take 1 tablet (40 mg total) by mouth daily at 6 (six) AM.   predniSONE 20 MG tablet Commonly known as:  DELTASONE Take 1-2 tablets (20-40 mg total) by mouth daily with breakfast. Take 40mg  for 2days then 20mg  daily for 2days   raloxifene 60 MG tablet Commonly known as:  EVISTA Take 60 mg by mouth daily.   sertraline 100 MG tablet Commonly known as:  ZOLOFT Take 100 mg by  mouth daily.   tiotropium 18 MCG inhalation capsule Commonly known as:  SPIRIVA Place 18 mcg into inhaler and inhale daily.   traMADol 50 MG tablet Commonly known as:  ULTRAM Take 50 mg by mouth 3 (three) times daily as needed.   WOMENS 50+ ADVANCED PO Take 1 tablet by mouth daily.      Allergies  Allergen Reactions  . Boniva [Ibandronate Sodium] Other (See Comments)    UNSPECIFIED REACTION   . Lyrica [Pregabalin] Nausea And Vomiting    Dizziness and blurred vision as well  . Nitrofurantoin Monohyd Macro Rash   Follow-up Information    Wenda Low, MD. Schedule an appointment as soon as possible for a visit in 1 week(s).   Specialty:  Internal Medicine Contact information: [address] [place] [address] [phone]        Lendon Colonel, NP Follow up on 12/20/2017.   Specialties:  Nurse Practitioner, Radiology, Cardiology Why:  11:30 am for follow up Contact information: [address] [place] [address] [phone]            The results of significant diagnostics from this hospitalization (including imaging, microbiology, ancillary and laboratory) are listed below for reference.    Significant Diagnostic Studies: Dg Chest 2 View  Result Date: 12/08/2017 CLINICAL DATA:  Acute onset of shortness of breath and chronic generalized chest pain. EXAM: CHEST - 2 VIEW COMPARISON:  Chest radiograph performed 07/10/2017, and CT of the chest performed 08/02/2017 FINDINGS: The lungs are well-aerated. Minimal scarring is noted at the right lung base. There is no evidence of pleural effusion or pneumothorax. The heart is borderline normal in size. No acute osseous abnormalities are seen. Clips are noted at the right upper quadrant. IMPRESSION: Minimal scarring at the right lung base.  Lungs otherwise clear. Electronically Signed   By: Garald Balding M.D.   On: 12/08/2017 04:23   Ct Chest Wo Contrast  Result Date:  12/08/2017 CLINICAL DATA:  Acute respiratory illness. COPD exacerbation, complicated EXAM: CT CHEST WITHOUT CONTRAST TECHNIQUE: Multidetector CT imaging of the chest was performed following the standard protocol without IV contrast. COMPARISON:  08/02/2017 FINDINGS: Cardiovascular: Normal heart size. No pericardial effusion. Atherosclerotic calcification of the aorta and coronaries. Mediastinum/Nodes: Debris seen throughout much of the esophagus without over distension or superimposed wall thickening. Negative for mediastinal adenopathy. Lungs/Pleura: Centrilobular and panlobular emphysema. Prior surgery on the right for lung cancer. No masslike findings. There is generalized airway thickening that has increased from prior. This more significantly affects right-sided airways which are distorted due to postoperative anatomy. Mild ground-glass density in the right lower lobe, possible atelectasis or pneumonitis. Upper Abdomen: Cholecystectomy. Musculoskeletal: Thoracic spine degeneration and exaggerated kyphosis. IMPRESSION: 1. Advanced  generalized airway thickening above baseline January 2019, compatible with acute on chronic bronchitis. 2. Ground-glass opacity in the right lower lobe that could be atelectasis or infection. 3. Postoperative right chest for lung cancer.  No masslike findings. 4. Aortic Atherosclerosis (ICD10-I70.0) and Emphysema (ICD10-J43.9). 5. Debris throughout the esophagus. Consider aspiration as a cause of #2. Electronically Signed   By: Monte Fantasia M.D.   On: 12/08/2017 10:34    Microbiology: Recent Results (from the past 240 hour(s))  Urine culture     Status: Abnormal   Collection Time: 12/08/17  4:33 PM  Result Value Ref Range Status   Specimen Description URINE, RANDOM  Final   Special Requests   Final    NONE Performed at [facility], [address], [place], [place] [address]    Culture >=100,000 COLONIES/mL ESCHERICHIA COLI (A)  Final   Report Status 12/11/2017  FINAL  Final   Organism ID, Bacteria ESCHERICHIA COLI (A)  Final      Susceptibility   Escherichia coli - MIC*    AMPICILLIN <=2 SENSITIVE Sensitive     CEFAZOLIN <=4 SENSITIVE Sensitive     CEFTRIAXONE <=1 SENSITIVE Sensitive     CIPROFLOXACIN <=0.25 SENSITIVE Sensitive     GENTAMICIN <=1 SENSITIVE Sensitive     IMIPENEM <=0.25 SENSITIVE Sensitive     NITROFURANTOIN <=16 SENSITIVE Sensitive     TRIMETH/SULFA <=20 SENSITIVE Sensitive     AMPICILLIN/SULBACTAM <=2 SENSITIVE Sensitive     PIP/TAZO <=4 SENSITIVE Sensitive     Extended ESBL NEGATIVE Sensitive     * >=100,000 COLONIES/mL ESCHERICHIA COLI     Labs: Basic Metabolic Panel: Recent Labs  Lab 12/08/17 0350 12/08/17 1104 12/09/17 0339 12/10/17 0256 12/11/17 0402 12/12/17 0915  NA 139  --  142 140 141 137  K 3.9  --  4.8 4.4 4.3 4.1  CL 104  --  108 107 106 102  CO2 25  --  28 26 27 26   GLUCOSE 137*  --  88 91 86 108*  BUN 11  --  13 12 12 14   CREATININE 1.31* 1.38* 1.18* 1.10* 1.17* 1.21*  CALCIUM 8.4*  --  8.3* 8.4* 8.7* 8.9   Liver Function Tests: Recent Labs  Lab 12/08/17 0350  AST 21  ALT 11*  ALKPHOS 58  BILITOT 0.4  PROT 6.6  ALBUMIN 3.1*   No results for input(s): LIPASE, AMYLASE in the last 168 hours. No results for input(s): AMMONIA in the last 168 hours. CBC: Recent Labs  Lab 12/08/17 0350 12/08/17 1104 12/09/17 0339 12/10/17 0256 12/11/17 0402 12/12/17 0221  WBC 8.7 6.7 11.0* 9.7 10.8* 10.0  NEUTROABS 5.1  --   --   --   --   --   HGB 12.2 11.7* 10.5* 10.3* 10.8* 11.4*  HCT 38.3 37.5 33.6* 33.4* 34.9* 36.5  MCV 88.5 87.8 90.1 91.0 89.7 89.7  PLT 275 296 231 224 239 253   Cardiac Enzymes: Recent Labs  Lab 12/08/17 0350 12/08/17 1104 12/08/17 1629 12/08/17 2051  TROPONINI 0.06* 0.38* 0.33* 0.23*   BNP: BNP (last 3 results) Recent Labs    12/08/17 0350  BNP 116.2*    ProBNP (last 3 results) No results for input(s): PROBNP in the last 8760 hours.  CBG: Recent Labs  Lab  12/09/17 1200 12/09/17 1629 12/09/17 2225 12/10/17 0730 12/12/17 0726  GLUCAP 110* 144* 137* 95 90       Signed:  Domenic Polite MD.  [facility] 12/12/2017, 1:35  PM   

## 2017-12-12 NOTE — Care Management Important Message (Signed)
Important Message  Patient Details  Name: [name] MRN: [id number] Date of Birth: 29-May-1941   Medicare Important Message Given:  Yes    Orbie Pyo 12/12/2017, 12:40 PM

## 2017-12-12 NOTE — Progress Notes (Signed)
Discharge information/instructions reviewed with patient. Removed IV from left and right arms with Band-Aid placed. New scripts reviewed with patient and placed in discharge packet.

## 2017-12-12 NOTE — Progress Notes (Signed)
Progress Note  Patient Name: [name] Date of Encounter: 12/12/2017  Primary Cardiologist: Dr Stanford Breed   Subjective   Patient ready for discharge today by the primary service.  She had cardiac catheterization yesterday revealing essentially clean coronary arteries with "Takatsubo  syndrome".  Inpatient Medications    Scheduled Meds: . aspirin  81 mg Oral Daily  . atorvastatin  20 mg Oral Daily  . doxycycline  100 mg Oral Q12H  . gabapentin  400 mg Oral TID  . guaiFENesin  600 mg Oral BID  . lisinopril  5 mg Oral q morning - 10a  . magnesium oxide  200 mg Oral Daily  . metoCLOPramide  5 mg Oral BID  . metoprolol tartrate  12.5 mg Oral BID  . mometasone-formoterol  2 puff Inhalation BID  . pantoprazole  40 mg Oral Q0600  . predniSONE  40 mg Oral Q breakfast  . raloxifene  60 mg Oral Daily  . sertraline  100 mg Oral Daily  . sodium chloride flush  3 mL Intravenous Q12H  . sodium chloride flush  3 mL Intravenous Q12H   Continuous Infusions: . sodium chloride     PRN Meds: sodium chloride, acetaminophen **OR** acetaminophen, albuterol, diazepam, ondansetron **OR** ondansetron (ZOFRAN) IV, oxyCODONE, polyethylene glycol, sodium chloride flush   Vital Signs    Vitals:   12/11/17 2048 12/11/17 2350 12/12/17 0731 12/12/17 0752  BP:  122/71 (!) 145/98   Pulse:  68 73   Resp:   16   Temp:  98 F (36.7 C) 97.7 F (36.5 C)   TempSrc:  Oral Oral   SpO2: 96% 94% 95% 95%  Weight:      Height:        Intake/Output Summary (Last 24 hours) at 12/12/2017 1314 Last data filed at 12/12/2017 0900 Gross per 24 hour  Intake 723 ml  Output -  Net 723 ml   Filed Weights   12/09/17 0500 12/10/17 0254 12/11/17 0500  Weight: 161 lb 9.6 oz (73.3 kg) 166 lb 10.7 oz (75.6 kg) 160 lb 15 oz (73 kg)    Telemetry    Normal sinus rhythm- Personally Reviewed  ECG    Not performed today- Personally Reviewed  Physical Exam   GEN: No acute distress.   Neck: No JVD Cardiac:  RRR, no murmurs, rubs, or gallops.  Respiratory: Clear to auscultation bilaterally. GI: Soft, nontender, non-distended  MS: No edema; No deformity. Neuro:  Nonfocal  Psych: Normal affect   Labs    Chemistry Recent Labs  Lab 12/08/17 0350  12/10/17 0256 12/11/17 0402 12/12/17 0915  NA 139   < > 140 141 137  K 3.9   < > 4.4 4.3 4.1  CL 104   < > 107 106 102  CO2 25   < > 26 27 26   GLUCOSE 137*   < > 91 86 108*  BUN 11   < > 12 12 14   CREATININE 1.31*   < > 1.10* 1.17* 1.21*  CALCIUM 8.4*   < > 8.4* 8.7* 8.9  PROT 6.6  --   --   --   --   ALBUMIN 3.1*  --   --   --   --   AST 21  --   --   --   --   ALT 11*  --   --   --   --   ALKPHOS 58  --   --   --   --  BILITOT 0.4  --   --   --   --   GFRNONAA 39*   < > 48* 45* 43*  GFRAA 45*   < > 56* 52* 50*  ANIONGAP 10   < > 7 8 9    < > = values in this interval not displayed.     Hematology Recent Labs  Lab 12/10/17 0256 12/11/17 0402 12/12/17 0221  WBC 9.7 10.8* 10.0  RBC 3.67* 3.89 4.07  HGB 10.3* 10.8* 11.4*  HCT 33.4* 34.9* 36.5  MCV 91.0 89.7 89.7  MCH 28.1 27.8 28.0  MCHC 30.8 30.9 31.2  RDW 15.3 15.5 15.7*  PLT 224 239 253    Cardiac Enzymes Recent Labs  Lab 12/08/17 0350 12/08/17 1104 12/08/17 1629 12/08/17 2051  TROPONINI 0.06* 0.38* 0.33* 0.23*   No results for input(s): TROPIPOC in the last 168 hours.   BNP Recent Labs  Lab 12/08/17 0350  BNP 116.2*     DDimer No results for input(s): DDIMER in the last 168 hours.   Radiology    No results found.  Cardiac Studies   2D echocardiogram (12/09/2017)  Study Conclusions  - Left ventricle: Poor images make wall motion assessment   difficult. There is suspicion of apical wall motion abnormality   and cannot rule out apical thrombus. The cavity size was normal.   There was mild concentric hypertrophy. There was an increased   relative contribution of atrial contraction to ventricular   filling. Doppler parameters are consistent with  abnormal left   ventricular relaxation (grade 1 diastolic dysfunction). - Mitral valve: Calcified annulus. - Pulmonary arteries: Systolic pressure could not be accurately   estimated.   Cardiac catheterization (12/11/2017)  Conclusion     Prox RCA lesion is 25% stenosed.  Mid LAD lesion is 25% stenosed.  There is moderate left ventricular systolic dysfunction.  LV end diastolic pressure is moderately elevated.  The left ventricular ejection fraction is 25-35% by visual estimate, with apical hypokinesis.  There is no aortic valve stenosis.   Nonobstructive CAD.  Takotsubo cardiomyopathy pattern.  Continue medical therapy.      Patient Profile     77 y.o. female admitted with abnormal EKG and minimally positive enzymes.  Cardiac catheterization revealed essentially normal coronary arteries with moderately severe LV dysfunction consistent with "Takatsubo-syndrome.  She is on appropriate medications for LV dysfunction and is minimally symptomatic.  She is stable for discharge today.  Assessment & Plan    1: LV dysfunction- clean coronary arteries on cardiac cath.  LVEF is in the 35% range with an anteroapical wall motion abnormality consistent with "Takatsubo  syndrome.  She is on lisinopril and a beta-blocker.  2: Hyperlipidemia- on statin therapy  3: Essential hypertension- well-controlled on current medications  She is being discharged by the primary service today.  She will follow-up with Dr. Stanford Breed.  A follow-up 2D echo will be obtained.  I suspect that she will have improvement over time and her LV function.   For questions or updates, please contact [place] Please consult [email or web address] for contact info under Cardiology/STEMI.      Signed, Quay Burow, MD  12/12/2017, 1:14 PM

## 2017-12-19 NOTE — Progress Notes (Deleted)
Cardiology Office Note   Date:  12/19/2017   ID:  [name], DOB 1940-08-05, MRN [id number]  PCP:  Wenda Low, MD  Cardiologist:  [facility] No chief complaint on file.    History of Present Illness: [name] is a 77 y.o. female who presents for post hospital assessment and follow up after admission for cardia catheterization after admission for NSTEMI. The patient's cath, dated 12/11/2017, did not reveal any significant CAD, but demonstrated Takotsubo Cardiomyopathy.     Past Medical History:  Diagnosis Date  . Adenocarcinoma of right lung, stage 1 ([place]) 07/28/2016  . Anxiety   . Cancer ([place])    uterine  . COPD (chronic obstructive pulmonary disease) ([place])   . Cyst    left side of neck  . Depression   . GERD (gastroesophageal reflux disease)   . Heart murmur    per patient  . Hyperlipidemia   . Hypertension   . Low back pain   . Lumbar back pain   . Osteoarthritis   . Osteoporosis     Past Surgical History:  Procedure Laterality Date  . ANKLE SURGERY Right    horse accident  . APPENDECTOMY    . BACK SURGERY    . CHOLECYSTECTOMY    . EYE SURGERY     lense implant  . FRACTURE SURGERY    . HIP SURGERY  01/2010   Left Hip   . INNER EAR SURGERY Bilateral 1970   related to severe ear infections  . KNEE SURGERY     Right knee  . LEFT HEART CATH AND CORONARY ANGIOGRAPHY N/A 12/11/2017   Procedure: LEFT HEART CATH AND CORONARY ANGIOGRAPHY;  Surgeon: Jettie Booze, MD;  Location: [facility];  Service: Cardiovascular;  Laterality: N/A;  . LOBECTOMY Right 05/09/2016   Procedure: RIGHT UPPER LOBECTOMY;  Surgeon: [name], MD;  Location: [facility];  Service: Thoracic;  Laterality: Right;  . MASS EXCISION  08/25/2011   Procedure: EXCISION MASS;  Surgeon: Harl Bowie, MD;  Location: [facility];  Service: General;  Laterality: N/A;  excision left neck mass  . TOTAL ABDOMINAL HYSTERECTOMY    . VIDEO ASSISTED  THORACOSCOPY (VATS)/WEDGE RESECTION Right 05/09/2016   Procedure: VIDEO ASSISTED THORACOSCOPY (VATS)/WEDGE RESECTION;  Surgeon: [name], MD;  Location: [facility];  Service: Thoracic;  Laterality: Right;     Current Outpatient Medications  Medication Sig Dispense Refill  . albuterol (PROVENTIL HFA;VENTOLIN HFA) 108 (90 BASE) MCG/ACT inhaler Inhale 2 puffs into the lungs every 6 (six) hours as needed. (Patient taking differently: Inhale 2 puffs into the lungs every 4 (four) hours as needed for wheezing or shortness of breath. ) 18 g 1  . albuterol (PROVENTIL) (2.5 MG/3ML) 0.083% nebulizer solution Take 3 mLs (2.5 mg total) by nebulization every 2 (two) hours as needed for wheezing or shortness of breath. 75 mL 12  . aluminum-magnesium hydroxide-simethicone (MAALOX) 875-643-32 MG/5ML SUSP Take 30 mLs by mouth as needed (indigestion).    Marland Kitchen atorvastatin (LIPITOR) 20 MG tablet Take 20 mg by mouth daily.     . diazepam (VALIUM) 5 MG tablet Take 2.5 mg by mouth every 8 (eight) hours as needed for anxiety (for nerves). Must last 30 days. Filled 11-28-17    . doxycycline (VIBRA-TABS) 100 MG tablet Take 1 tablet (100 mg total) by mouth every 12 (twelve) hours. For 4 days 8 tablet 0  . Fluticasone-Salmeterol (ADVAIR DISKUS) 250-50 MCG/DOSE AEPB Inhale 1 puff into  the lungs every 12 (twelve) hours. 60 each 0  . gabapentin (NEURONTIN) 300 MG capsule Take 1 capsule (300 mg total) by mouth 3 (three) times daily.  0  . Glucosamine HCl (GLUCOSAMINE PO) Take 1 tablet by mouth daily.    Marland Kitchen lisinopril (PRINIVIL,ZESTRIL) 10 MG tablet Take 1 tablet (10 mg total) by mouth every morning. 30 tablet 0  . Magnesium 250 MG TABS Take 250 mg by mouth daily.    . Menthol, Topical Analgesic, (BERRI-FREEZ PAIN RELIEVING) 10 % LIQD Apply 1 application topically as needed (pain).    Marland Kitchen metoCLOPramide (REGLAN) 5 MG tablet Take 5 mg by mouth 2 (two) times daily.     . metoprolol tartrate (LOPRESSOR) 25 MG tablet Take 0.5  tablets (12.5 mg total) by mouth 2 (two) times daily. 60 tablet 0  . Multiple Vitamins-Minerals (WOMENS 50+ ADVANCED PO) Take 1 tablet by mouth daily.    . pantoprazole (PROTONIX) 40 MG tablet Take 1 tablet (40 mg total) by mouth daily at 6 (six) AM. 30 tablet 0  . predniSONE (DELTASONE) 20 MG tablet Take 1-2 tablets (20-40 mg total) by mouth daily with breakfast. Take 40mg  for 2days then 20mg  daily for 2days 6 tablet 0  . raloxifene (EVISTA) 60 MG tablet Take 60 mg by mouth daily.    . sertraline (ZOLOFT) 100 MG tablet Take 100 mg by mouth daily.    Marland Kitchen tiotropium (SPIRIVA) 18 MCG inhalation capsule Place 18 mcg into inhaler and inhale daily.      . traMADol (ULTRAM) 50 MG tablet Take 50 mg by mouth 3 (three) times daily as needed.     No current facility-administered medications for this visit.     Allergies:   Boniva [ibandronate sodium]; Lyrica [pregabalin]; and Nitrofurantoin monohyd macro    Social History:  The patient  reports that she quit smoking about 5 years ago. Her smoking use included cigarettes. She has a 102.00 pack-year smoking history. She has never used smokeless tobacco. She reports that she does not drink alcohol or use drugs.   Family History:  The patient's family history includes Cancer in her sister; Heart disease in her brother, father, and mother.    ROS: All other systems are reviewed and negative. Unless otherwise mentioned in H&P    PHYSICAL EXAM: VS:  There were no vitals taken for this visit. , BMI There is no height or weight on file to calculate BMI. GEN: Well nourished, well developed, in no acute distress  HEENT: normal  Neck: no JVD, carotid bruits, or masses Cardiac: ***RRR; no murmurs, rubs, or gallops,no edema  Respiratory:  clear to auscultation bilaterally, normal work of breathing GI: soft, nontender, nondistended, + BS MS: no deformity or atrophy  Skin: warm and dry, no rash Neuro:  Strength and sensation are intact Psych: euthymic mood,  full affect   EKG:  EKG {ACTION; IS/IS [id number]} ordered today. The ekg ordered today demonstrates ***   Recent Labs: 12/08/2017: ALT 11; B Natriuretic Peptide 116.2 12/12/2017: BUN 14; Creatinine, Ser 1.21; Hemoglobin 11.4; Platelets 253; Potassium 4.1; Sodium 137    Lipid Panel No results found for: CHOL, TRIG, HDL, CHOLHDL, VLDL, LDLCALC, LDLDIRECT    Wt Readings from Last 3 Encounters:  12/11/17 160 lb 15 oz (73 kg)  08/03/17 167 lb 6.4 oz (75.9 kg)  06/21/17 163 lb (73.9 kg)      Other studies Reviewed:  Cardiac catheterization (12/11/2017)  Conclusion     Prox RCA lesion is 25% stenosed.  Mid LAD lesion is 25% stenosed.  There is moderate left ventricular systolic dysfunction.  LV end diastolic pressure is moderately elevated.  The left ventricular ejection fraction is 25-35% by visual estimate, with apical hypokinesis.  There is no aortic valve stenosis.  Nonobstructive CAD. Takotsubo cardiomyopathy pattern. Continue medical therapy.     2D echocardiogram (12/09/2017)  Study Conclusions  - Left ventricle: Poor images make wall motion assessment difficult. There is suspicion of apical wall motion abnormality and cannot rule out apical thrombus. The cavity size was normal. There was mild concentric hypertrophy. There was an increased relative contribution of atrial contraction to ventricular filling. Doppler parameters are consistent with abnormal left ventricular relaxation (grade 1 diastolic dysfunction). - Mitral valve: Calcified annulus. - Pulmonary arteries: Systolic pressure could not be accurately estimated.     ASSESSMENT AND PLAN:  1.  ***   Current medicines are reviewed at length with the patient today.    Labs/ tests ordered today include: *** Phill Myron. West Pugh, ANP, AACC   12/19/2017 8:43 AM    [facility] [address], [place], [place] [address] Phone: [phone];  Fax: [phone]

## 2017-12-20 ENCOUNTER — Ambulatory Visit: Payer: Medicare Other | Admitting: Adult Health

## 2017-12-21 ENCOUNTER — Encounter: Payer: Self-pay | Admitting: Adult Health

## 2018-01-04 ENCOUNTER — Ambulatory Visit: Payer: Medicare Other | Admitting: Adult Health

## 2018-01-08 ENCOUNTER — Encounter (HOSPITAL_COMMUNITY): Payer: Self-pay

## 2018-01-08 ENCOUNTER — Emergency Department (HOSPITAL_COMMUNITY): Payer: Medicare Other

## 2018-01-08 ENCOUNTER — Other Ambulatory Visit: Payer: Self-pay

## 2018-01-08 ENCOUNTER — Emergency Department (HOSPITAL_COMMUNITY)
Admission: EM | Admit: 2018-01-08 | Discharge: 2018-01-08 | Disposition: A | Discharge status: Home / Self Care | Payer: Medicare Other | Attending: Emergency Medicine | Admitting: Emergency Medicine

## 2018-01-08 DIAGNOSIS — Z79899 Other long term (current) drug therapy: Secondary | ICD-10-CM | POA: Insufficient documentation

## 2018-01-08 DIAGNOSIS — I129 Hypertensive chronic kidney disease with stage 1 through stage 4 chronic kidney disease, or unspecified chronic kidney disease: Secondary | ICD-10-CM | POA: Diagnosis not present

## 2018-01-08 DIAGNOSIS — Z87891 Personal history of nicotine dependence: Secondary | ICD-10-CM | POA: Insufficient documentation

## 2018-01-08 DIAGNOSIS — N183 Chronic kidney disease, stage 3 (moderate): Secondary | ICD-10-CM | POA: Diagnosis not present

## 2018-01-08 DIAGNOSIS — R05 Cough: Secondary | ICD-10-CM | POA: Diagnosis present

## 2018-01-08 DIAGNOSIS — Z8542 Personal history of malignant neoplasm of other parts of uterus: Secondary | ICD-10-CM | POA: Diagnosis not present

## 2018-01-08 DIAGNOSIS — J441 Chronic obstructive pulmonary disease with (acute) exacerbation: Secondary | ICD-10-CM

## 2018-01-08 DIAGNOSIS — R059 Cough, unspecified: Secondary | ICD-10-CM

## 2018-01-08 LAB — COMPREHENSIVE METABOLIC PANEL
ALT: 13 U/L — ABNORMAL LOW (ref 14–54)
AST: 22 U/L (ref 15–41)
Albumin: 3.7 g/dL (ref 3.5–5.0)
Alkaline Phosphatase: 51 U/L (ref 38–126)
Anion gap: 9 (ref 5–15)
BUN: 11 mg/dL (ref 6–20)
CO2: 24 mmol/L (ref 22–32)
Calcium: 8.6 mg/dL — ABNORMAL LOW (ref 8.9–10.3)
Chloride: 104 mmol/L (ref 101–111)
Creatinine, Ser: 1.2 mg/dL — ABNORMAL HIGH (ref 0.44–1.00)
GFR calc Af Amer: 50 mL/min — ABNORMAL LOW (ref >60)
GFR calc non Af Amer: 43 mL/min — ABNORMAL LOW (ref >60)
Glucose, Bld: 95 mg/dL (ref 65–99)
Potassium: 3.8 mmol/L (ref 3.5–5.1)
Sodium: 137 mmol/L (ref 135–145)
Total Bilirubin: 0.6 mg/dL (ref 0.3–1.2)
Total Protein: 7.1 g/dL (ref 6.5–8.1)

## 2018-01-08 LAB — LIPASE, BLOOD: Lipase: 35 U/L (ref 11–51)

## 2018-01-08 LAB — CBC
HCT: 39.4 % (ref 36.0–46.0)
Hemoglobin: 12.6 g/dL (ref 12.0–15.0)
MCH: 28.4 pg (ref 26.0–34.0)
MCHC: 32 g/dL (ref 30.0–36.0)
MCV: 88.7 fL (ref 78.0–100.0)
Platelets: 288 10*3/uL (ref 150–400)
RBC: 4.44 MIL/uL (ref 3.87–5.11)
RDW: 14.6 % (ref 11.5–15.5)
WBC: 13 10*3/uL — ABNORMAL HIGH (ref 4.0–10.5)

## 2018-01-08 LAB — I-STAT TROPONIN, ED: Troponin i, poc: 0.01 ng/mL (ref 0.00–0.08)

## 2018-01-08 IMAGING — DX DG CHEST 2V
2 series · 2 of 2 positions shown · non-contrast
Comparison: CT chest [DATE]

CLINICAL DATA: Chest pain. Shortness of breath. Cough and
dizziness. Partial lobectomy on the right.

EXAM:
CHEST - 2 VIEW

[chest pa]
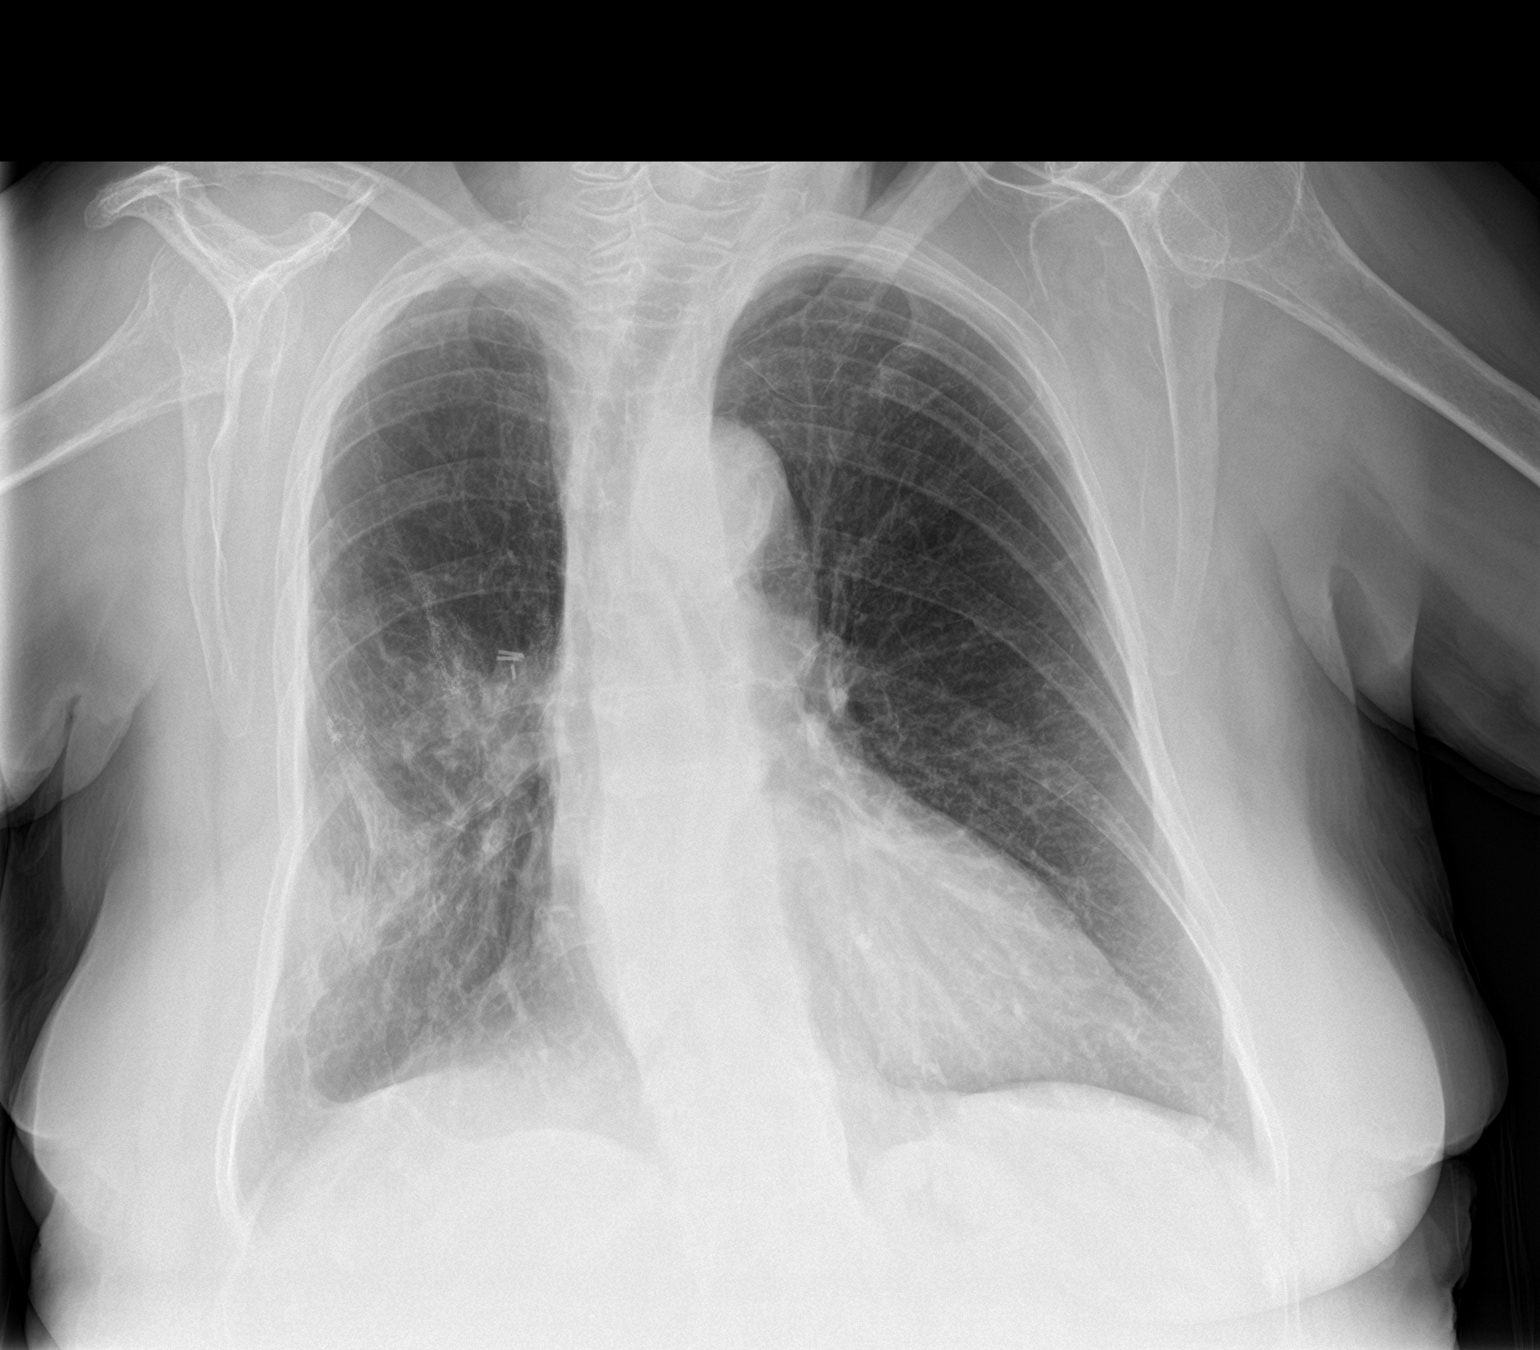

[chest lat]
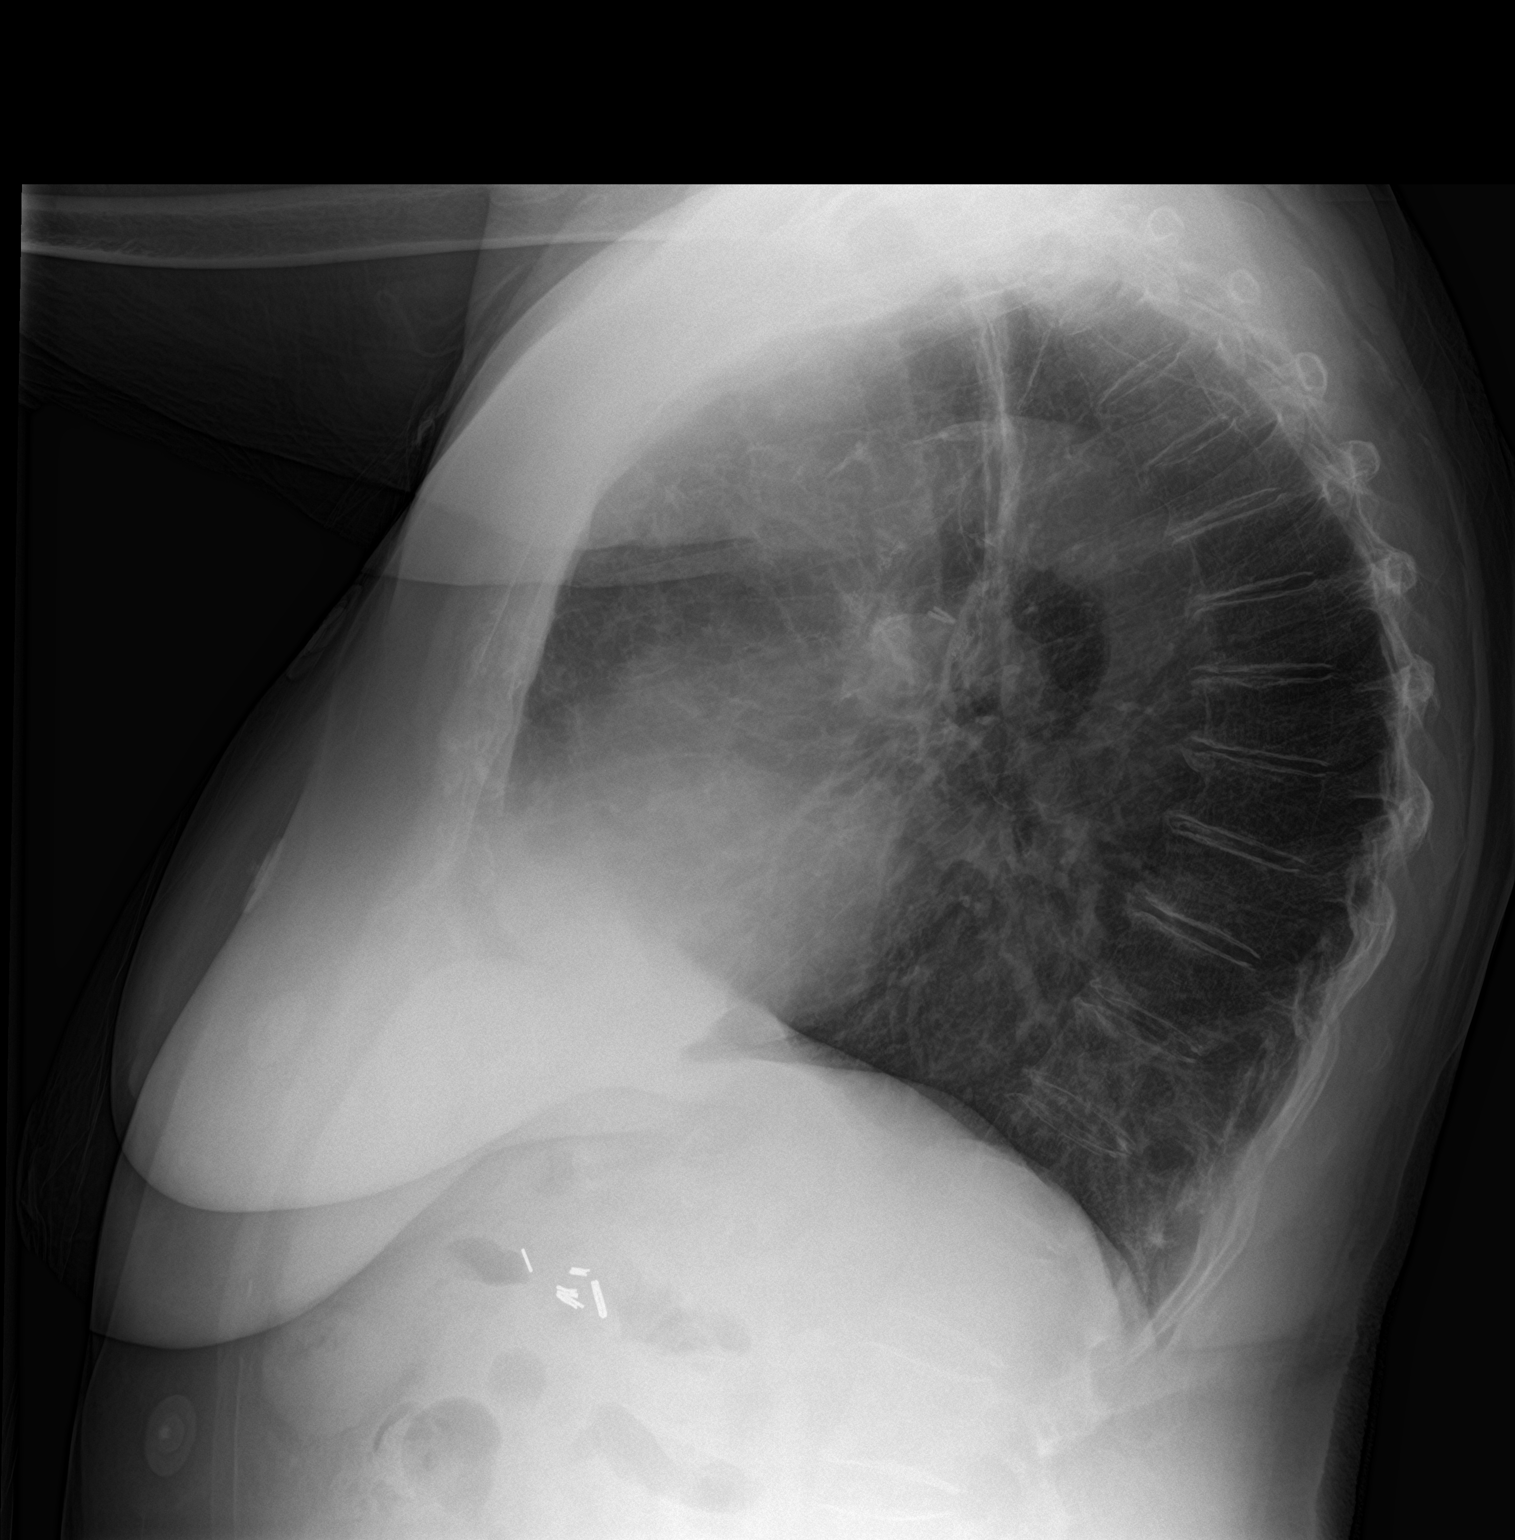

[2 of 2 positions shown; findings below may reference images not displayed]

FINDINGS: Heart is upper limits of normal for size. Atherosclerotic
calcifications are noted at the aortic arch. Postsurgical changes
are noted in the right lung. No superimposed disease is present.
Aeration is improved prior exam. Left lung is clear. The visualized
soft tissues and bony thorax are unremarkable.
IMPRESSION: 1. Improved aeration of both lungs.
2. Postsurgical changes of the right lung.
3. No acute cardiopulmonary disease.

## 2018-01-08 MED ORDER — AZITHROMYCIN 250 MG PO TABS: 250.0000 mg | ORAL_TABLET | Freq: Every day | ORAL | 0 refills | Status: DC

## 2018-01-08 MED ORDER — AZITHROMYCIN 250 MG PO TABS
500.0000 mg | ORAL_TABLET | Freq: Once | ORAL | Status: AC
  Administered 2018-01-08: 500 mg via ORAL
  Filled 2018-01-08: qty 2

## 2018-01-08 MED ORDER — IPRATROPIUM BROMIDE 0.02 % IN SOLN
0.5000 mg | Freq: Once | RESPIRATORY_TRACT | Status: AC
  Administered 2018-01-08: 0.5 mg via RESPIRATORY_TRACT
  Filled 2018-01-08: qty 2.5

## 2018-01-08 MED ORDER — ALBUTEROL SULFATE (2.5 MG/3ML) 0.083% IN NEBU
5.0000 mg | INHALATION_SOLUTION | Freq: Once | RESPIRATORY_TRACT | Status: AC
  Administered 2018-01-08: 5 mg via RESPIRATORY_TRACT
  Filled 2018-01-08: qty 6

## 2018-01-08 NOTE — ED Provider Notes (Signed)
[facility] EMERGENCY DEPARTMENT Provider Note   CSN: [id number] Arrival date & time: 01/08/18  1251     History   Chief Complaint Chief Complaint  Patient presents with  . Shortness of Breath  . Emesis    HPI [name] is a 77 y.o. female.  Patient w hx copd, c/o sob last evening and earlier today. Symptoms were gradual onset, moderate, persistent.  States had a couple episodes of emesis last night, but no further emesis today. Pt is tolerating po fluids, and states feels improved now.  Patient denies any current or recent chest pain or discomfort. No leg pain or swelling. States compliant w home meds and has adequate supply at home. No fever or chills. Non prod cough. No sore throat. Denies pnd/orthopnea.   The history is provided by the patient.  Shortness of Breath  Associated symptoms include cough, wheezing and vomiting. Pertinent negatives include no fever, no headaches, no sore throat, no neck pain, no chest pain, no abdominal pain, no rash and no leg swelling.  Emesis   Associated symptoms include cough. Pertinent negatives include no abdominal pain, no fever and no headaches.    Past Medical History:  Diagnosis Date  . Adenocarcinoma of right lung, stage 1 ([place]) 07/28/2016  . Anxiety   . Cancer ([place])    uterine  . COPD (chronic obstructive pulmonary disease) ([place])   . Cyst    left side of neck  . Depression   . GERD (gastroesophageal reflux disease)   . Heart murmur    per patient  . Hyperlipidemia   . Hypertension   . Low back pain   . Lumbar back pain   . Osteoarthritis   . Osteoporosis     Patient Active Problem List   Diagnosis Date Noted  . Acute systolic heart failure ([place])   . Non-ST elevation (NSTEMI) myocardial infarction ([place])   . History of uterine cancer 12/08/2017  . Generalized anxiety disorder 12/08/2017  . GERD (gastroesophageal reflux disease) 12/08/2017  . Substernal chest pain 12/08/2017  . Elevated brain  natriuretic peptide (BNP) level 12/08/2017  . Elevated troponin 12/08/2017  . Embedded tick of upper back excluding scapular region 12/08/2017  . Chronic pain of lower extremity [facility] Area of Pain) (B (R>L) 06/21/2017  . Chronic hip pain (Fourth Area of Pain) (Bilateral) (R>L) 06/21/2017  . Chronic sacroiliac joint pain 06/21/2017  . Disorder of bone, unspecified 06/21/2017  . Other long term (current) drug therapy 06/21/2017  . Other specified health status 06/21/2017  . Chronic chest wall pain(Primary Area of Pain) (R) 06/21/2017  . Chronic post-thoracotomy pain 03/03/2017  . 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 Adenocarcinoma of right lung, stage 1 ([place]) 07/28/2016  . S/P lobectomy of lung 05/09/2016  . DVT (deep venous thrombosis) ([place]) 04/26/2016  . Heart murmur   . Acute exacerbation of chronic obstructive pulmonary disease (COPD) ([place]) 08/29/2015  . Essential hypertension 08/29/2015  . Hypercholesterolemia 08/29/2015  . Chronic low back pain 08/29/2015  . CKD (chronic kidney disease), stage III ([place]) 08/29/2015  . COPD (chronic obstructive pulmonary disease) ([place]) 04/27/2015  . Acute on chronic respiratory failure with hypoxia ([place]) 04/27/2015  . Infected sebaceous cyst 08/16/2011    Past Surgical History:  Procedure Laterality Date  . ANKLE SURGERY Right    horse accident  . APPENDECTOMY    . BACK SURGERY    . CHOLECYSTECTOMY    . EYE SURGERY     lense implant  .  FRACTURE SURGERY    . HIP SURGERY  01/2010   Left Hip   . INNER EAR SURGERY Bilateral 1970   related to severe ear infections  . KNEE SURGERY     Right knee  . LEFT HEART CATH AND CORONARY ANGIOGRAPHY N/A 12/11/2017   Procedure: LEFT HEART CATH AND CORONARY ANGIOGRAPHY;  Surgeon: Jettie Booze, MD;  Location: [facility];  Service: Cardiovascular;  Laterality: N/A;  . LOBECTOMY Right 05/09/2016   Procedure: RIGHT UPPER LOBECTOMY;  Surgeon: [name], MD;   Location: [facility];  Service: Thoracic;  Laterality: Right;  . MASS EXCISION  08/25/2011   Procedure: EXCISION MASS;  Surgeon: Harl Bowie, MD;  Location: [facility];  Service: General;  Laterality: N/A;  excision left neck mass  . TOTAL ABDOMINAL HYSTERECTOMY    . VIDEO ASSISTED THORACOSCOPY (VATS)/WEDGE RESECTION Right 05/09/2016   Procedure: VIDEO ASSISTED THORACOSCOPY (VATS)/WEDGE RESECTION;  Surgeon: [name], MD;  Location: [facility];  Service: Thoracic;  Laterality: Right;     OB History   None      Home Medications    Prior to Admission medications   Medication Sig Start Date End Date Taking? Authorizing Provider  albuterol (PROVENTIL HFA;VENTOLIN HFA) 108 (90 BASE) MCG/ACT inhaler Inhale 2 puffs into the lungs every 6 (six) hours as needed. Patient taking differently: Inhale 2 puffs into the lungs every 4 (four) hours as needed for wheezing or shortness of breath.  04/28/15   Hosie Poisson, MD  albuterol (PROVENTIL) (2.5 MG/3ML) 0.083% nebulizer solution Take 3 mLs (2.5 mg total) by nebulization every 2 (two) hours as needed for wheezing or shortness of breath. 04/28/15   Hosie Poisson, MD  aluminum-magnesium hydroxide-simethicone (MAALOX) 163-846-65 MG/5ML SUSP Take 30 mLs by mouth as needed (indigestion).    [provider]  atorvastatin (LIPITOR) 20 MG tablet Take 20 mg by mouth daily.     [provider]  diazepam (VALIUM) 5 MG tablet Take 2.5 mg by mouth every 8 (eight) hours as needed for anxiety (for nerves). Must last 30 days. Filled 11-28-17    [provider]  doxycycline (VIBRA-TABS) 100 MG tablet Take 1 tablet (100 mg total) by mouth every 12 (twelve) hours. For 4 days 12/12/17   Domenic Polite, MD  Fluticasone-Salmeterol (ADVAIR DISKUS) 250-50 MCG/DOSE AEPB Inhale 1 puff into the lungs every 12 (twelve) hours. 08/30/15   Debbe Odea, MD  gabapentin (NEURONTIN) 300 MG capsule Take 1 capsule (300 mg total) by mouth 3  (three) times daily. 12/12/17   Domenic Polite, MD  Glucosamine HCl (GLUCOSAMINE PO) Take 1 tablet by mouth daily.    [provider]  lisinopril (PRINIVIL,ZESTRIL) 10 MG tablet Take 1 tablet (10 mg total) by mouth every morning. 12/12/17   Domenic Polite, MD  Magnesium 250 MG TABS Take 250 mg by mouth daily.    [provider]  Menthol, Topical Analgesic, (BERRI-FREEZ PAIN RELIEVING) 10 % LIQD Apply 1 application topically as needed (pain).    [provider]  metoCLOPramide (REGLAN) 5 MG tablet Take 5 mg by mouth 2 (two) times daily.     [provider]  metoprolol tartrate (LOPRESSOR) 25 MG tablet Take 0.5 tablets (12.5 mg total) by mouth 2 (two) times daily. 12/12/17   Domenic Polite, MD  Multiple Vitamins-Minerals (WOMENS 50+ ADVANCED PO) Take 1 tablet by mouth daily.    [provider]  pantoprazole (PROTONIX) 40 MG tablet Take 1 tablet (40 mg  total) by mouth daily at 6 (six) AM. 04/28/15   Hosie Poisson, MD  predniSONE (DELTASONE) 20 MG tablet Take 1-2 tablets (20-40 mg total) by mouth daily with breakfast. Take 40mg  for 2days then 20mg  daily for 2days 12/10/17   Domenic Polite, MD  raloxifene (EVISTA) 60 MG tablet Take 60 mg by mouth daily.    [provider]  sertraline (ZOLOFT) 100 MG tablet Take 100 mg by mouth daily.    [provider]  tiotropium (SPIRIVA) 18 MCG inhalation capsule Place 18 mcg into inhaler and inhale daily.      [provider]  traMADol (ULTRAM) 50 MG tablet Take 50 mg by mouth 3 (three) times daily as needed. 12/04/17   [provider]    Family History Family History  Problem Relation Age of Onset  . Heart disease Mother   . Heart disease Father   . Cancer Sister        #1, breast  . Heart disease Brother        had CABG    Social History Social History   Tobacco Use  . Smoking status: Former Smoker    Packs/day: 2.00    Years: 51.00    Pack years: 102.00    Types:  Cigarettes    Last attempt to quit: 05/04/2012    Years since quitting: 5.6  . Smokeless tobacco: Never Used  Substance Use Topics  . Alcohol use: No    Alcohol/week: 0.0 oz  . Drug use: No     Allergies   Boniva [ibandronate sodium] and Lyrica [pregabalin]   Review of Systems Review of Systems  Constitutional: Negative for fever.  HENT: Negative for sore throat.   Eyes: Negative for redness.  Respiratory: Positive for cough, shortness of breath and wheezing.   Cardiovascular: Negative for chest pain and leg swelling.  Gastrointestinal: Positive for vomiting. Negative for abdominal pain.  Genitourinary: Negative for flank pain.  Musculoskeletal: Negative for back pain and neck pain.  Skin: Negative for rash.  Neurological: Negative for headaches.  Hematological: Does not bruise/bleed easily.  Psychiatric/Behavioral: Negative for confusion.     Physical Exam Updated Vital Signs BP (!) 151/105 (BP Location: Right Arm)   Pulse 79   Temp 99.7 F (37.6 C) (Oral)   Resp 18   SpO2 96%   Physical Exam  Constitutional: She appears well-developed and well-nourished. No distress.  HENT:  Mouth/Throat: Oropharynx is clear and moist.  Eyes: Conjunctivae are normal. No scleral icterus.  Neck: Neck supple. No tracheal deviation present.  Cardiovascular: Normal rate, regular rhythm, normal heart sounds and intact distal pulses. Exam reveals no gallop and no friction rub.  No murmur heard. Pulmonary/Chest: Effort normal. No respiratory distress. She has wheezes.  Abdominal: Soft. Normal appearance and bowel sounds are normal. She exhibits no distension. There is no tenderness.  Genitourinary:  Genitourinary Comments: No cva tenderness  Musculoskeletal: She exhibits no edema or tenderness.  Neurological: She is alert.  Skin: Skin is warm and dry. No rash noted. She is not diaphoretic.  Psychiatric: She has a normal mood and affect.  Nursing note and vitals reviewed.    ED  Treatments / Results  Labs (all labs ordered are listed, but only abnormal results are displayed) Results for orders placed or performed during the hospital encounter of 01/08/18  CBC  Result Value Ref Range   WBC 13.0 (H) 4.0 - 10.5 K/uL   RBC 4.44 3.87 - 5.11 MIL/uL   Hemoglobin 12.6  12.0 - 15.0 g/dL   HCT 39.4 36.0 - 46.0 %   MCV 88.7 78.0 - 100.0 fL   MCH 28.4 26.0 - 34.0 pg   MCHC 32.0 30.0 - 36.0 g/dL   RDW 14.6 11.5 - 15.5 %   Platelets 288 150 - 400 K/uL  Lipase, blood  Result Value Ref Range   Lipase 35 11 - 51 U/L  Comprehensive metabolic panel  Result Value Ref Range   Sodium 137 135 - 145 mmol/L   Potassium 3.8 3.5 - 5.1 mmol/L   Chloride 104 101 - 111 mmol/L   CO2 24 22 - 32 mmol/L   Glucose, Bld 95 65 - 99 mg/dL   BUN 11 6 - 20 mg/dL   Creatinine, Ser 1.20 (H) 0.44 - 1.00 mg/dL   Calcium 8.6 (L) 8.9 - 10.3 mg/dL   Total Protein 7.1 6.5 - 8.1 g/dL   Albumin 3.7 3.5 - 5.0 g/dL   AST 22 15 - 41 U/L   ALT 13 (L) 14 - 54 U/L   Alkaline Phosphatase 51 38 - 126 U/L   Total Bilirubin 0.6 0.3 - 1.2 mg/dL   GFR calc non Af Amer 43 (L) >60 mL/min   GFR calc Af Amer 50 (L) >60 mL/min   Anion gap 9 5 - 15  I-stat troponin, ED  Result Value Ref Range   Troponin i, poc 0.01 0.00 - 0.08 ng/mL   Comment 3           Dg Chest 2 View  Result Date: 01/08/2018 CLINICAL DATA:  Chest pain. Shortness of breath. Cough and dizziness. Partial lobectomy on the right. EXAM: CHEST - 2 VIEW COMPARISON:  CT chest 12/08/2017 FINDINGS: Heart is upper limits of normal for size. Atherosclerotic calcifications are noted at the aortic arch. Postsurgical changes are noted in the right lung. No superimposed disease is present. Aeration is improved prior exam. Left lung is clear. The visualized soft tissues and bony thorax are unremarkable. IMPRESSION: 1. Improved aeration of both lungs. 2. Postsurgical changes of the right lung. 3. No acute cardiopulmonary disease. Electronically Signed   By:  San Morelle M.D.   On: 01/08/2018 14:14   ' EKG EKG Interpretation  Date/Time:  Monday January 08 2018 13:00:46 EDT Ventricular Rate:  101 PR Interval:  138 QRS Duration: 78 QT Interval:  338 QTC Calculation: 438 R Axis:   35 Text Interpretation:  Sinus tachycardia Nonspecific T wave abnormality Abnormal ekg Confirmed by Carmin Muskrat [phone]) on 01/08/2018 2:35:28 PM   Radiology Dg Chest 2 View  Result Date: 01/08/2018 CLINICAL DATA:  Chest pain. Shortness of breath. Cough and dizziness. Partial lobectomy on the right. EXAM: CHEST - 2 VIEW COMPARISON:  CT chest 12/08/2017 FINDINGS: Heart is upper limits of normal for size. Atherosclerotic calcifications are noted at the aortic arch. Postsurgical changes are noted in the right lung. No superimposed disease is present. Aeration is improved prior exam. Left lung is clear. The visualized soft tissues and bony thorax are unremarkable. IMPRESSION: 1. Improved aeration of both lungs. 2. Postsurgical changes of the right lung. 3. No acute cardiopulmonary disease. Electronically Signed   By: San Morelle M.D.   On: 01/08/2018 14:14    Procedures Procedures (including critical care time)  Medications Ordered in ED Medications  albuterol (PROVENTIL) (2.5 MG/3ML) 0.083% nebulizer solution 5 mg (has no administration in time range)  ipratropium (ATROVENT) nebulizer solution 0.5 mg (has no administration in time range)     Initial  Impression / Assessment and Plan / ED Course  I have reviewed the triage vital signs and the nursing notes.  Pertinent labs & imaging results that were available during my care of the patient were reviewed by me and considered in my medical decision making (see chart for details).  Iv ns. Labs. Cxr.   Reviewed nursing notes and prior charts for additional history.   Pt denies any current or recent cp or discomfort. Trop neg.  cxr reviewed - no infiltrate.   Albuterol/atrovent neb.  No  increased wob. Pulse ox 96-97% room air.   Pt states feels fine and requests d/c to home.   Is tolerating po, no recurrent nv in ED.  Given increased cough, low grade temp, wbc 13, hx copd, will also cover w abx.   Patient currently appears stable for d/c.     Final Clinical Impressions(s) / ED Diagnoses   Final diagnoses:  None    ED Discharge Orders    None       Lajean Saver, MD 01/08/18 1625

## 2018-01-08 NOTE — ED Notes (Signed)
Pt.is in xray at this time .when return back to lobby vitalsigns will be reacess

## 2018-01-08 NOTE — ED Notes (Signed)
Patient reports that she has had difficulty breathing since her "lung surgery in October 2018" She reports that "part of her right lung was removed"

## 2018-01-08 NOTE — ED Triage Notes (Signed)
Pt brought in by her family who states she has been short of breath and vomiting since last night. Pt reports she has some LUQ pain. Pt is a poor historian.

## 2018-01-08 NOTE — Discharge Instructions (Signed)
It was our pleasure to provide your ER care today - we hope that you feel better.  Use your nebulizer treatments as need.   Take antibiotic as prescribed.   Follow up with primary care doctor in the coming week for recheck.  Return to ER if worse, increased trouble breathing, chest pain, other concern.

## 2018-01-08 NOTE — ED Notes (Signed)
D/c reviewed with patient 

## 2018-01-16 NOTE — Progress Notes (Signed)
Cardiology Office Note   Date:  01/17/2018   ID:  [name], DOB 1940/12/28, MRN [id number]  PCP:  Wenda Low, MD  Cardiologist: Dr. Stanford Breed / Dr. Gwenlyn Found   Chief Complaint  Patient presents with  . Hospitalization Follow-up  . Coronary Artery Disease    History of Present Illness: [name] is a 77 y.o. female who presents for ongoing assessment and management of Taku Tsubo cardiomyopathy, cardiac cath 12/11/2017 with non-obstructive CAD, hypertension, with recent hospitalization in May of 2019 for chest pain.  She also has a history of COPD, hypercholesterolemia, video-assisted thoracotomy, history of DVT in October 2017 no longer on anticoagulation ,CKD Stage III, GERD.    She was discharged on 12/12/2017.  During hospitalization CT of the chest revealed lower lobe groundglass opacities and possible pleural effusion in the right lower lobe.  Post hospitalization it is recommended by cardiology that she have an echocardiogram.  It was also noted that she had an embedded tick in the right upper back excluding the scapular region which was removed and she was placed on doxycycline 100 mg p.o. twice daily for 4 days. She was treated for hypertension with adjustment in lisinopril dose to 10 mg daily she was continued on beta-blocker therapy.  She comes today with complaints of generalized fatigue. She states that she is also experiencing sharp pain under her right breast and back from prior thoracotomy. She denies dizziness, heart racing, dyspnea. She is medically compliant.   Past Medical History:  Diagnosis Date  . Adenocarcinoma of right lung, stage 1 ([place]) 07/28/2016  . Anxiety   . Cancer ([place])    uterine  . COPD (chronic obstructive pulmonary disease) ([place])   . Cyst    left side of neck  . Depression   . GERD (gastroesophageal reflux disease)   . Heart murmur    per patient  . Hyperlipidemia   . Hypertension   . Low back pain   . Lumbar back pain   .  Osteoarthritis   . Osteoporosis     Past Surgical History:  Procedure Laterality Date  . ANKLE SURGERY Right    horse accident  . APPENDECTOMY    . BACK SURGERY    . CHOLECYSTECTOMY    . EYE SURGERY     lense implant  . FRACTURE SURGERY    . HIP SURGERY  01/2010   Left Hip   . INNER EAR SURGERY Bilateral 1970   related to severe ear infections  . KNEE SURGERY     Right knee  . LEFT HEART CATH AND CORONARY ANGIOGRAPHY N/A 12/11/2017   Procedure: LEFT HEART CATH AND CORONARY ANGIOGRAPHY;  Surgeon: Jettie Booze, MD;  Location: [facility];  Service: Cardiovascular;  Laterality: N/A;  . LOBECTOMY Right 05/09/2016   Procedure: RIGHT UPPER LOBECTOMY;  Surgeon: [name], MD;  Location: [facility];  Service: Thoracic;  Laterality: Right;  . MASS EXCISION  08/25/2011   Procedure: EXCISION MASS;  Surgeon: Harl Bowie, MD;  Location: [facility];  Service: General;  Laterality: N/A;  excision left neck mass  . TOTAL ABDOMINAL HYSTERECTOMY    . VIDEO ASSISTED THORACOSCOPY (VATS)/WEDGE RESECTION Right 05/09/2016   Procedure: VIDEO ASSISTED THORACOSCOPY (VATS)/WEDGE RESECTION;  Surgeon: [name], MD;  Location: [facility];  Service: Thoracic;  Laterality: Right;     Current Outpatient Medications  Medication Sig Dispense Refill  . albuterol (PROVENTIL HFA;VENTOLIN HFA) 108 (90 BASE) MCG/ACT inhaler Inhale  2 puffs into the lungs every 6 (six) hours as needed. (Patient taking differently: Inhale 2 puffs into the lungs every 4 (four) hours as needed for wheezing or shortness of breath. ) 18 g 1  . albuterol (PROVENTIL) (2.5 MG/3ML) 0.083% nebulizer solution Take 3 mLs (2.5 mg total) by nebulization every 2 (two) hours as needed for wheezing or shortness of breath. 75 mL 12  . aluminum-magnesium hydroxide-simethicone (MAALOX) 379-024-09 MG/5ML SUSP Take 30 mLs by mouth as needed (indigestion).    Marland Kitchen atorvastatin (LIPITOR) 20 MG tablet Take 20 mg by  mouth daily.     Marland Kitchen azithromycin (ZITHROMAX Z-PAK) 250 MG tablet Take 1 tablet (250 mg total) by mouth daily. 4 tablet 0  . diazepam (VALIUM) 5 MG tablet Take 2.5 mg by mouth every 8 (eight) hours as needed for anxiety (for nerves). Must last 30 days. Filled 11-28-17    . doxycycline (VIBRA-TABS) 100 MG tablet Take 1 tablet (100 mg total) by mouth every 12 (twelve) hours. For 4 days 8 tablet 0  . Fluticasone-Salmeterol (ADVAIR DISKUS) 250-50 MCG/DOSE AEPB Inhale 1 puff into the lungs every 12 (twelve) hours. 60 each 0  . gabapentin (NEURONTIN) 300 MG capsule Take 1 capsule (300 mg total) by mouth 3 (three) times daily.  0  . Glucosamine HCl (GLUCOSAMINE PO) Take 1 tablet by mouth daily.    Marland Kitchen lisinopril (PRINIVIL,ZESTRIL) 10 MG tablet Take 1 tablet (10 mg total) by mouth every morning. 30 tablet 0  . lisinopril (PRINIVIL,ZESTRIL) 30 MG tablet Take 30 mg by mouth daily.    . Magnesium 250 MG TABS Take 250 mg by mouth daily.    . Menthol, Topical Analgesic, (BERRI-FREEZ PAIN RELIEVING) 10 % LIQD Apply 1 application topically as needed (pain).    Marland Kitchen metoCLOPramide (REGLAN) 5 MG tablet Take 5 mg by mouth 2 (two) times daily.     . metoprolol tartrate (LOPRESSOR) 25 MG tablet Take 0.5 tablets (12.5 mg total) by mouth 2 (two) times daily. 60 tablet 0  . Multiple Vitamins-Minerals (WOMENS 50+ ADVANCED PO) Take 1 tablet by mouth daily.    . pantoprazole (PROTONIX) 40 MG tablet Take 1 tablet (40 mg total) by mouth daily at 6 (six) AM. 30 tablet 0  . predniSONE (DELTASONE) 20 MG tablet Take 1-2 tablets (20-40 mg total) by mouth daily with breakfast. Take 40mg  for 2days then 20mg  daily for 2days 6 tablet 0  . raloxifene (EVISTA) 60 MG tablet Take 60 mg by mouth daily.    . sertraline (ZOLOFT) 100 MG tablet Take 100 mg by mouth daily.    Marland Kitchen tiotropium (SPIRIVA) 18 MCG inhalation capsule Place 18 mcg into inhaler and inhale daily.      . traMADol (ULTRAM) 50 MG tablet Take 50 mg by mouth 3 (three) times daily as  needed.     No current facility-administered medications for this visit.     Allergies:   Boniva [ibandronate sodium] and Lyrica [pregabalin]    Social History:  The patient  reports that she quit smoking about 5 years ago. Her smoking use included cigarettes. She has a 102.00 pack-year smoking history. She has never used smokeless tobacco. She reports that she does not drink alcohol or use drugs.   Family History:  The patient's family history includes Cancer in her sister; Heart disease in her brother, father, and mother.    ROS: All other systems are reviewed and negative. Unless otherwise mentioned in H&P    PHYSICAL EXAM: VS:  Ht  5\' 1"  (1.549 m)   BMI 30.41 kg/m  , BMI Body mass index is 30.41 kg/m. GEN: Well nourished, well developed, in no acute distress  HEENT: normal  Neck: no JVD, carotid bruits, or masses Cardiac: RRR; no murmurs, rubs, or gallops,no edema  Respiratory:  clear to auscultation bilaterally, normal work of breathing GI: soft, nontender, nondistended, + BS MS: no deformity or atrophy  Skin: warm and dry, no rash Neuro:  Strength and sensation are intact Psych: euthymic mood, full affect   EKG:  Not completed during this office visit.   Recent Labs: 12/08/2017: B Natriuretic Peptide 116.2 01/08/2018: ALT 13; BUN 11; Creatinine, Ser 1.20; Hemoglobin 12.6; Platelets 288; Potassium 3.8; Sodium 137    Lipid Panel No results found for: CHOL, TRIG, HDL, CHOLHDL, VLDL, LDLCALC, LDLDIRECT    Wt Readings from Last 3 Encounters:  12/11/17 160 lb 15 oz (73 kg)  08/03/17 167 lb 6.4 oz (75.9 kg)  06/21/17 163 lb (73.9 kg)      Other studies Reviewed: Cardiac catheterization (12/11/2017)  Conclusion     Prox RCA lesion is 25% stenosed.  Mid LAD lesion is 25% stenosed.  There is moderate left ventricular systolic dysfunction.  LV end diastolic pressure is moderately elevated.  The left ventricular ejection fraction is 25-35% by visual  estimate, with apical hypokinesis.  There is no aortic valve stenosis.  Nonobstructive CAD. Takotsubo cardiomyopathy pattern. Continue medical therapy.       ASSESSMENT AND PLAN:  1.Takotsubo Cardiomyopathy: She denies any further chest discomfort but continues musculoskeletal pain on the right chest. She is medically compliant but has complaints of fatigue. This is likely from institution of BB. She is advised to continue this medication in hopes of getting used to it. I will repeat echocardiogram for re-evaluation now that she is on medications.   2. Hypertension: She is well controlled.   3. Systolic CHF: She has no evidence of volume overload.   4.  Chronic pain: She is on tramadol but has not been taking this. I have suggested that she take this up to 3 times a day for control and alternate with tylenol.   Follow up with CVTS as directed.    Current medicines are reviewed at length with the patient today.    Labs/ tests ordered today include: Echo, BMET, CBC, Mg.   Phill Myron. West Pugh, ANP, AACC   01/17/2018 8:07 AM    [facility] [address] [place], [place] [address] Phone: [phone]; Fax: [phone]

## 2018-01-17 ENCOUNTER — Encounter: Payer: Self-pay | Admitting: Adult Health

## 2018-01-17 ENCOUNTER — Ambulatory Visit (INDEPENDENT_AMBULATORY_CARE_PROVIDER_SITE_OTHER): Payer: Medicare Other | Admitting: Adult Health

## 2018-01-17 VITALS — BP 120/82 | HR 65 | Ht 61.0 in | Wt 150.8 lb

## 2018-01-17 DIAGNOSIS — Z79899 Other long term (current) drug therapy: Secondary | ICD-10-CM | POA: Diagnosis not present

## 2018-01-17 DIAGNOSIS — N183 Chronic kidney disease, stage 3 unspecified: Secondary | ICD-10-CM

## 2018-01-17 DIAGNOSIS — R002 Palpitations: Secondary | ICD-10-CM | POA: Diagnosis not present

## 2018-01-17 DIAGNOSIS — I519 Heart disease, unspecified: Secondary | ICD-10-CM

## 2018-01-17 DIAGNOSIS — R5383 Other fatigue: Secondary | ICD-10-CM | POA: Diagnosis not present

## 2018-01-17 DIAGNOSIS — I43 Cardiomyopathy in diseases classified elsewhere: Secondary | ICD-10-CM

## 2018-01-17 DIAGNOSIS — R0602 Shortness of breath: Secondary | ICD-10-CM | POA: Diagnosis not present

## 2018-01-17 DIAGNOSIS — R52 Pain, unspecified: Secondary | ICD-10-CM

## 2018-01-17 LAB — CBC
Hematocrit: 37.9 % (ref 34.0–46.6)
Hemoglobin: 12.5 g/dL (ref 11.1–15.9)
MCH: 29.1 pg (ref 26.6–33.0)
MCHC: 33 g/dL (ref 31.5–35.7)
MCV: 88 fL (ref 79–97)
Platelets: 308 10*3/uL (ref 150–450)
RBC: 4.3 x10E6/uL (ref 3.77–5.28)
RDW: 15.3 % (ref 12.3–15.4)
WBC: 6.4 10*3/uL (ref 3.4–10.8)

## 2018-01-17 LAB — BASIC METABOLIC PANEL
BUN/Creatinine Ratio: 7 — ABNORMAL LOW (ref 12–28)
BUN: 9 mg/dL (ref 8–27)
CO2: 25 mmol/L (ref 20–29)
Calcium: 9.1 mg/dL (ref 8.7–10.3)
Chloride: 103 mmol/L (ref 96–106)
Creatinine, Ser: 1.25 mg/dL — ABNORMAL HIGH (ref 0.57–1.00)
GFR calc Af Amer: 49 mL/min/{1.73_m2} — ABNORMAL LOW (ref >59)
GFR calc non Af Amer: 42 mL/min/{1.73_m2} — ABNORMAL LOW (ref >59)
Glucose: 93 mg/dL (ref 65–99)
Potassium: 4.7 mmol/L (ref 3.5–5.2)
Sodium: 141 mmol/L (ref 134–144)

## 2018-01-17 LAB — MAGNESIUM: Magnesium: 2.2 mg/dL (ref 1.6–2.3)

## 2018-01-17 NOTE — Patient Instructions (Signed)
Medication Instructions:  NO CHANGES- Your physician recommends that you continue on your current medications as directed. Please refer to the Current Medication list given to you today.  If you need a refill on your cardiac medications before your next appointment, please call your pharmacy.  Labwork: BMET,CBC AND MAG TODAY HERE IN OUR OFFICE AT [facility]  Take the provided lab slips with you to the lab for your blood draw.   Testing/Procedures: Echocardiogram - Your physician has requested that you have an echocardiogram. Echocardiography is a painless test that uses sound waves to create images of your heart. It provides your doctor with information about the size and shape of your heart and how well your heart's chambers and valves are working. This procedure takes approximately one hour. There are no restrictions for this procedure. This will be performed at our [facility] location - [address], [address].  Follow-Up: Your physician wants you to follow-up in: [name] (Jane Lew), DNP,AACC IF PRIMARY CARDIOLOGIST IS UNAVAILABLE.   Thank you for choosing [facility] at [facility]!!

## 2018-01-23 ENCOUNTER — Ambulatory Visit (HOSPITAL_COMMUNITY): Payer: Medicare Other | Attending: Cardiovascular Disease

## 2018-01-23 ENCOUNTER — Other Ambulatory Visit: Payer: Self-pay | Admitting: Adult Health

## 2018-01-23 ENCOUNTER — Other Ambulatory Visit: Payer: Self-pay

## 2018-01-23 DIAGNOSIS — I10 Essential (primary) hypertension: Secondary | ICD-10-CM | POA: Insufficient documentation

## 2018-01-23 DIAGNOSIS — Z86718 Personal history of other venous thrombosis and embolism: Secondary | ICD-10-CM | POA: Diagnosis not present

## 2018-01-23 DIAGNOSIS — I2510 Atherosclerotic heart disease of native coronary artery without angina pectoris: Secondary | ICD-10-CM | POA: Diagnosis not present

## 2018-01-23 DIAGNOSIS — R5383 Other fatigue: Secondary | ICD-10-CM | POA: Insufficient documentation

## 2018-01-23 DIAGNOSIS — Z8249 Family history of ischemic heart disease and other diseases of the circulatory system: Secondary | ICD-10-CM | POA: Diagnosis not present

## 2018-01-23 DIAGNOSIS — R0602 Shortness of breath: Secondary | ICD-10-CM | POA: Insufficient documentation

## 2018-01-23 DIAGNOSIS — J449 Chronic obstructive pulmonary disease, unspecified: Secondary | ICD-10-CM | POA: Insufficient documentation

## 2018-01-23 DIAGNOSIS — E785 Hyperlipidemia, unspecified: Secondary | ICD-10-CM | POA: Insufficient documentation

## 2018-01-23 DIAGNOSIS — I5181 Takotsubo syndrome: Secondary | ICD-10-CM | POA: Diagnosis not present

## 2018-01-23 DIAGNOSIS — I519 Heart disease, unspecified: Secondary | ICD-10-CM | POA: Diagnosis not present

## 2018-01-23 DIAGNOSIS — C3490 Malignant neoplasm of unspecified part of unspecified bronchus or lung: Secondary | ICD-10-CM | POA: Insufficient documentation

## 2018-01-23 DIAGNOSIS — Z87891 Personal history of nicotine dependence: Secondary | ICD-10-CM | POA: Insufficient documentation

## 2018-01-26 ENCOUNTER — Ambulatory Visit (HOSPITAL_COMMUNITY)
Admission: RE | Admit: 2018-01-26 | Discharge: 2018-01-26 | Disposition: A | Discharge status: Home / Self Care | Payer: Medicare Other | Source: Ambulatory Visit | Attending: Internal Medicine | Admitting: Internal Medicine

## 2018-01-26 ENCOUNTER — Inpatient Hospital Stay: Payer: Medicare Other | Attending: Internal Medicine

## 2018-01-26 DIAGNOSIS — C3491 Malignant neoplasm of unspecified part of right bronchus or lung: Secondary | ICD-10-CM | POA: Diagnosis not present

## 2018-01-26 DIAGNOSIS — C3490 Malignant neoplasm of unspecified part of unspecified bronchus or lung: Secondary | ICD-10-CM

## 2018-01-26 DIAGNOSIS — J439 Emphysema, unspecified: Secondary | ICD-10-CM | POA: Insufficient documentation

## 2018-01-26 DIAGNOSIS — I2510 Atherosclerotic heart disease of native coronary artery without angina pectoris: Secondary | ICD-10-CM | POA: Insufficient documentation

## 2018-01-26 DIAGNOSIS — I700 Atherosclerosis of aorta: Secondary | ICD-10-CM | POA: Diagnosis not present

## 2018-01-26 LAB — CBC WITH DIFFERENTIAL (CANCER CENTER ONLY)
Basophils Absolute: 0 10*3/uL (ref 0.0–0.1)
Basophils Relative: 1 %
Eosinophils Absolute: 0.2 10*3/uL (ref 0.0–0.5)
Eosinophils Relative: 3 %
HCT: 37.3 % (ref 34.8–46.6)
Hemoglobin: 12.4 g/dL (ref 11.6–15.9)
Lymphocytes Relative: 26 %
Lymphs Abs: 1.6 10*3/uL (ref 0.9–3.3)
MCH: 29.1 pg (ref 25.1–34.0)
MCHC: 33.2 g/dL (ref 31.5–36.0)
MCV: 87.6 fL (ref 79.5–101.0)
Monocytes Absolute: 0.5 10*3/uL (ref 0.1–0.9)
Monocytes Relative: 8 %
Neutro Abs: 3.9 10*3/uL (ref 1.5–6.5)
Neutrophils Relative %: 62 %
Platelet Count: 302 10*3/uL (ref 145–400)
RBC: 4.26 MIL/uL (ref 3.70–5.45)
RDW: 14.8 % — ABNORMAL HIGH (ref 11.2–14.5)
WBC Count: 6.2 10*3/uL (ref 3.9–10.3)

## 2018-01-26 LAB — COMPREHENSIVE METABOLIC PANEL
ALT: 11 U/L (ref 0–44)
AST: 17 U/L (ref 15–41)
Albumin: 3.8 g/dL (ref 3.5–5.0)
Alkaline Phosphatase: 51 U/L (ref 38–126)
Anion gap: 9 (ref 5–15)
BUN: 11 mg/dL (ref 8–23)
CO2: 29 mmol/L (ref 22–32)
Calcium: 9 mg/dL (ref 8.9–10.3)
Chloride: 105 mmol/L (ref 98–111)
Creatinine, Ser: 1.25 mg/dL — ABNORMAL HIGH (ref 0.44–1.00)
GFR calc Af Amer: 48 mL/min — ABNORMAL LOW (ref >60)
GFR calc non Af Amer: 41 mL/min — ABNORMAL LOW (ref >60)
Glucose, Bld: 93 mg/dL (ref 70–99)
Potassium: 4 mmol/L (ref 3.5–5.1)
Sodium: 143 mmol/L (ref 135–145)
Total Bilirubin: 0.5 mg/dL (ref 0.3–1.2)
Total Protein: 7.6 g/dL (ref 6.5–8.1)

## 2018-01-26 IMAGING — CT CT CHEST W/O CM
2 of 3 series · 15 of 36 positions shown, 18 images · non-contrast
Comparison: [DATE]

CLINICAL DATA: Lung cancer follow-up.  Status post

EXAM:
CT CHEST WITHOUT CONTRAST
TECHNIQUE: Multidetector CT imaging of the chest was performed following the
standard protocol without IV contrast.

[Series 2: thorax · axial · 0.73mm/px · z∈[-254,-24]mm · 12 of 135 slices shown, 15 images]
[im 10/135  mediastinal]
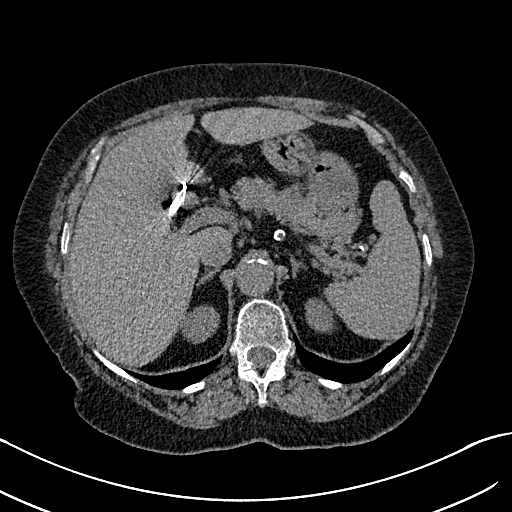
[im 10/135  lung]
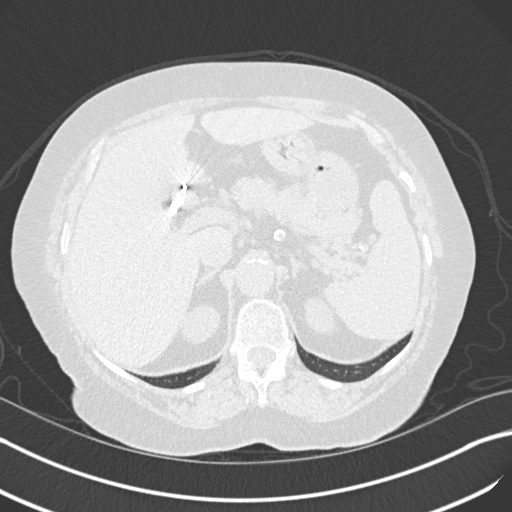
[im 20/135  lung]
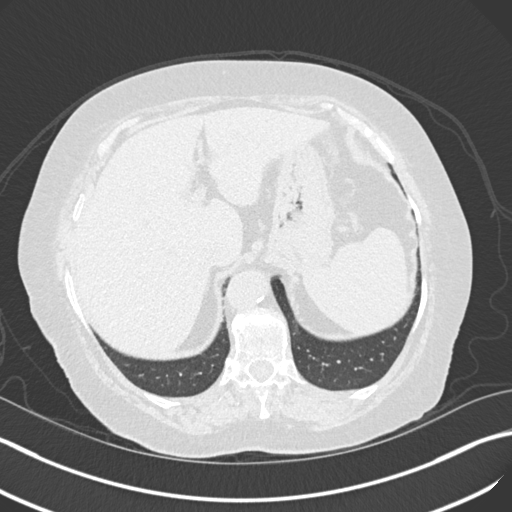
[im 30/135  lung]
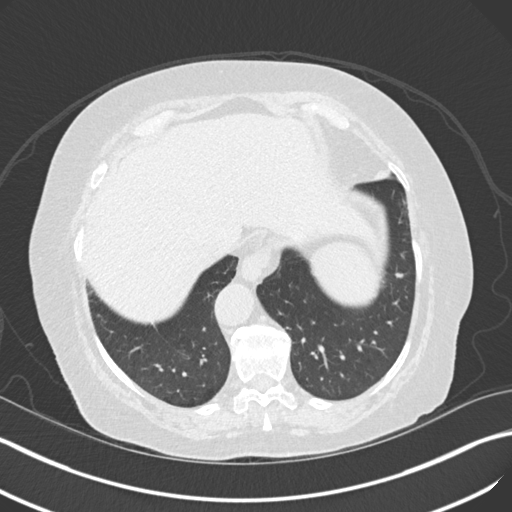
[im 40/135  lung]
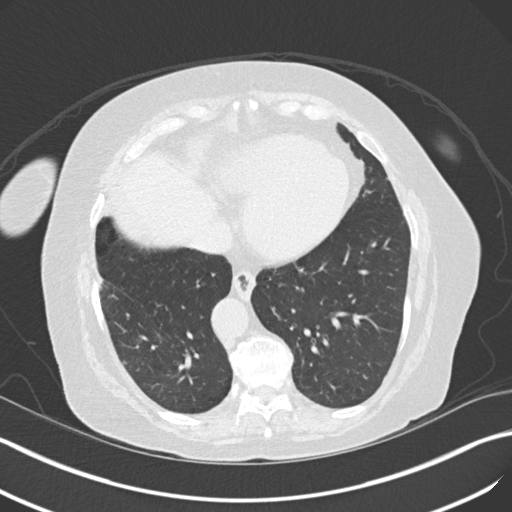
[im 50/135  mediastinal]
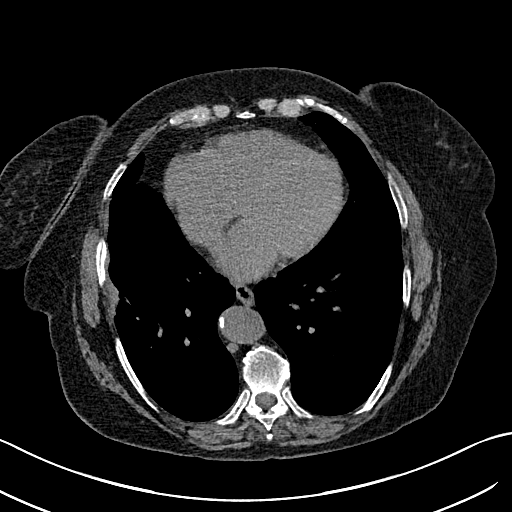
[im 50/135  lung]
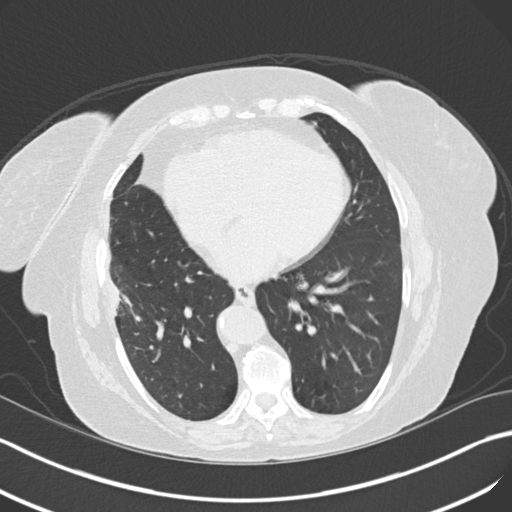
[im 60/135  lung]
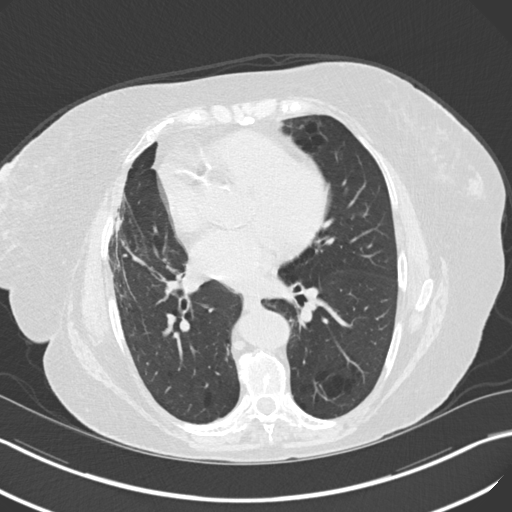
[im 75/135  lung]
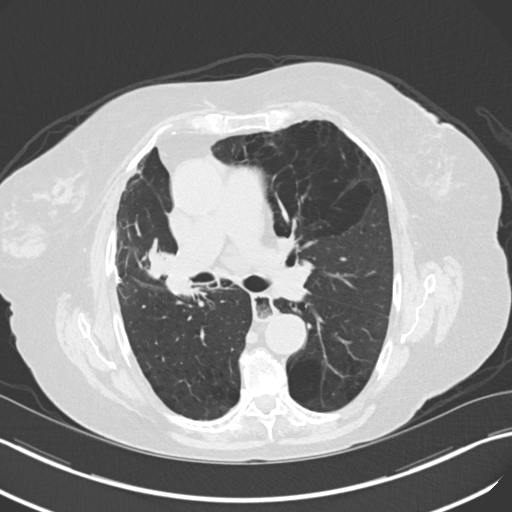
[im 85/135  lung]
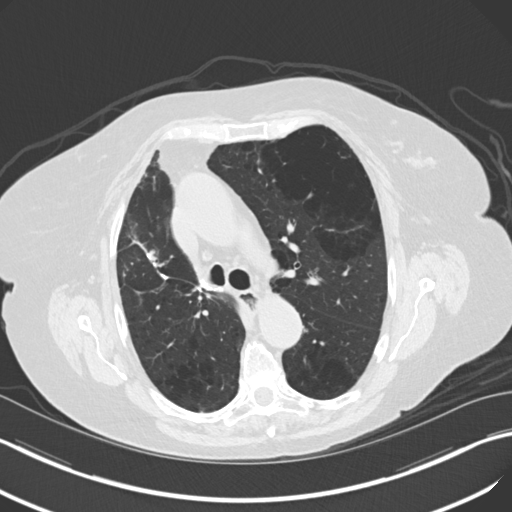
[im 95/135  mediastinal]
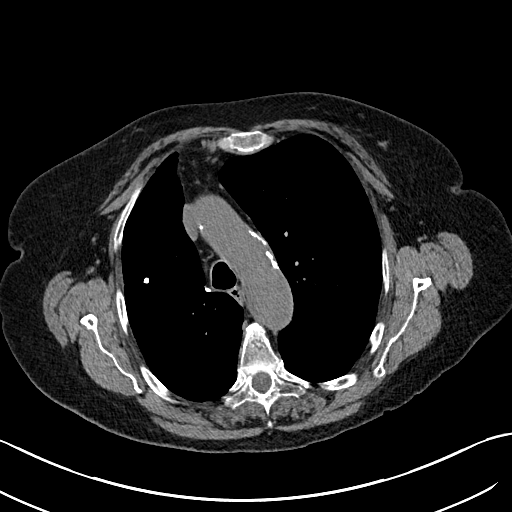
[im 95/135  lung]
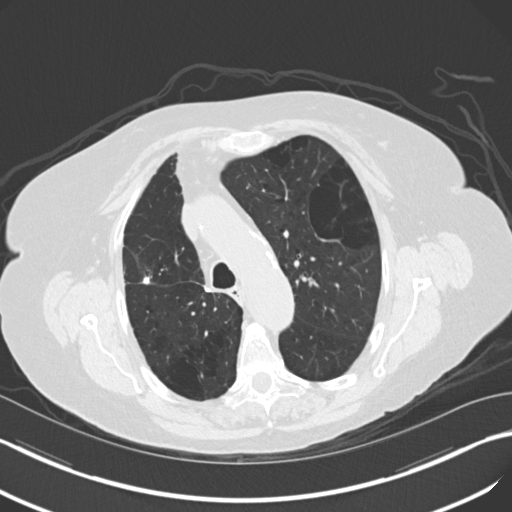
[im 105/135  lung]
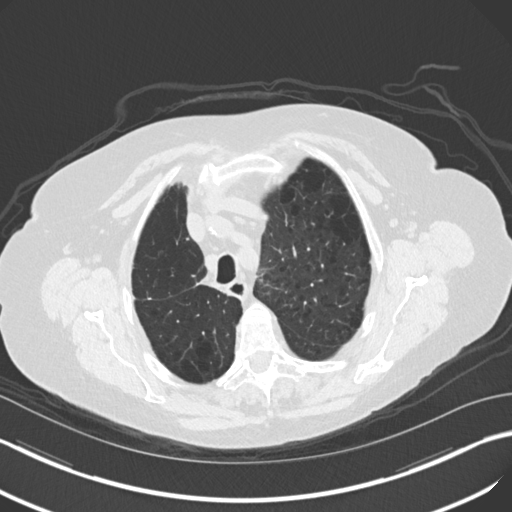
[im 115/135  lung]
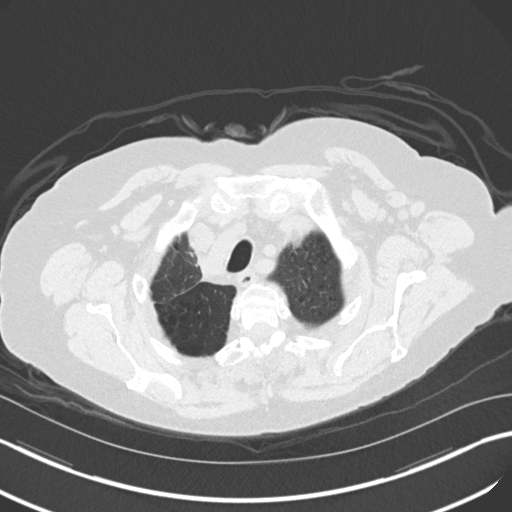
[im 125/135  lung]
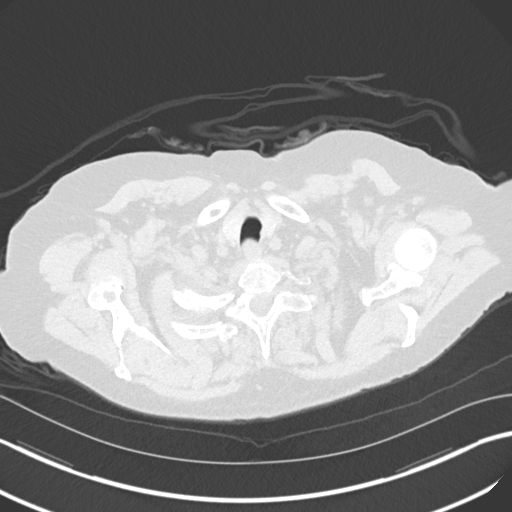

[Series 5: coronal · coronal · 0.54mm/px · 3 of 151 slices shown]
[im 31/151  lung]
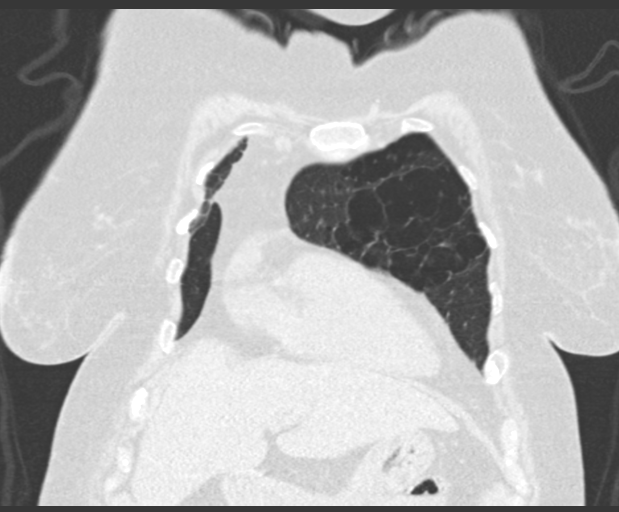
[im 61/151  lung]
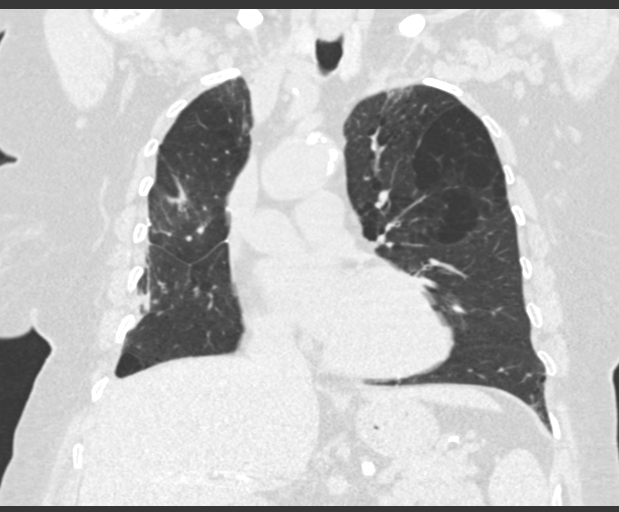
[im 91/151  lung]
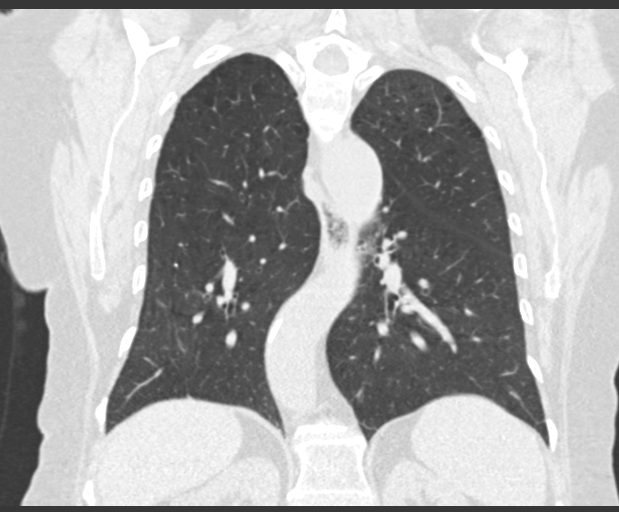

[15 of 36 positions shown; findings below may reference images not displayed]

FINDINGS: Cardiovascular: The heart size appears normal. Aortic
atherosclerosis. Calcification within the LAD, RCA coronary arteries
noted.

Mediastinum/Nodes: Normal appearance of the thyroid gland. The
trachea appears patent and is midline. Normal appearance of the
esophagus. Stable 8 mm right paratracheal lymph node, image 32/2. No
mediastinal or hilar adenopathy identified.

Lungs/Pleura: No pleural effusions. Advanced changes of emphysema.
Status post right upper lobectomy. Left lower lobe lung nodule is
unchanged measuring 4 mm, image 94/7. Also stable is a nodule within
the left lower lobe measuring 5 mm, image 106/7. Stable left upper
lobe nodule measuring 4 mm, image 60/7. New nodule within the right
lower lobe measures 4 mm, image 88/7. Nonspecific.

Upper Abdomen: No acute abnormality.

Musculoskeletal: The thoracic spine appears kyphotic. Mild multi
level degenerative disc disease identified. No suspicious bone
lesions.
IMPRESSION: 1. Stable postoperative changes from right upper lobectomy.
2. Small nodules within the left lung are nonspecific and appear
unchanged from previous exam. A new nonspecific nodule within the
right lower lobe measures 4 mm and is also nonspecific. Attention in
these nodules on follow-up imaging is recommended.
3.  Emphysema ([Q6]-[Q6]).
4. Aortic atherosclerosis and multi vessel coronary artery
atherosclerotic calcifications. Aortic Atherosclerosis
([Q6]-[Q6]).

## 2018-01-29 ENCOUNTER — Telehealth: Payer: Self-pay | Admitting: Internal Medicine

## 2018-01-29 ENCOUNTER — Inpatient Hospital Stay (HOSPITAL_BASED_OUTPATIENT_CLINIC_OR_DEPARTMENT_OTHER): Payer: Medicare Other | Admitting: Internal Medicine

## 2018-01-29 ENCOUNTER — Encounter: Payer: Self-pay | Admitting: Internal Medicine

## 2018-01-29 VITALS — BP 122/59 | HR 62 | Temp 97.7°F | Resp 17 | Ht 61.0 in

## 2018-01-29 DIAGNOSIS — C3490 Malignant neoplasm of unspecified part of unspecified bronchus or lung: Secondary | ICD-10-CM

## 2018-01-29 DIAGNOSIS — I10 Essential (primary) hypertension: Secondary | ICD-10-CM | POA: Diagnosis not present

## 2018-01-29 DIAGNOSIS — C3491 Malignant neoplasm of unspecified part of right bronchus or lung: Secondary | ICD-10-CM | POA: Diagnosis not present

## 2018-01-29 NOTE — Telephone Encounter (Signed)
Scheduled appt pe r7/8 los - scheduled appt per los - sent reminder letter in the mail with appt date  And time.

## 2018-01-29 NOTE — Progress Notes (Signed)
[facility] [phone]   [phone]  OFFICE PROGRESS NOTE  Wenda Low, MD [address] [place] [address]  DIAGNOSIS: Stage IA (T1a, N0, M0) non-small cell lung cancer, adenocarcinoma presented with right upper lobe pulmonary nodule  PRIOR THERAPY: Status post right upper lobectomy with lymph node dissection on 05/09/2016 by Dr. Roxan Hockey.  CURRENT THERAPY: Observation.  INTERVAL HISTORY: [name] 77 y.o. female returns to the clinic today for six-month follow-up visit.  The patient is feeling fine today with no specific complaints except for intermittent pain on the right side of the chest at the surgical scar.She also has shortness of breath with exertion.  She has no cough or hemoptysis.  She denied having any nausea, vomiting, diarrhea or constipation.  She has no fever or chills.  The patient is here today for evaluation with repeat CT scan of the chest for restaging of her disease.  MEDICAL HISTORY: Past Medical History:  Diagnosis Date  . Adenocarcinoma of right lung, stage 1 ([place]) 07/28/2016  . Anxiety   . Cancer ([place])    uterine  . COPD (chronic obstructive pulmonary disease) ([place])   . Cyst    left side of neck  . Depression   . GERD (gastroesophageal reflux disease)   . Heart murmur    per patient  . Hyperlipidemia   . Hypertension   . Low back pain   . Lumbar back pain   . Osteoarthritis   . Osteoporosis     ALLERGIES:  is allergic to boniva [ibandronate sodium] and lyrica [pregabalin].  MEDICATIONS:  Current Outpatient Medications  Medication Sig Dispense Refill  . albuterol (PROVENTIL HFA;VENTOLIN HFA) 108 (90 BASE) MCG/ACT inhaler Inhale 2 puffs into the lungs every 6 (six) hours as needed. (Patient taking differently: Inhale 2 puffs into the lungs every 4 (four) hours as needed for wheezing or shortness of breath. ) 18 g 1  . albuterol (PROVENTIL) (2.5 MG/3ML) 0.083% nebulizer solution  Take 3 mLs (2.5 mg total) by nebulization every 2 (two) hours as needed for wheezing or shortness of breath. 75 mL 12  . aluminum-magnesium hydroxide-simethicone (MAALOX) 124-580-99 MG/5ML SUSP Take 30 mLs by mouth as needed (indigestion).    Marland Kitchen atorvastatin (LIPITOR) 20 MG tablet Take 20 mg by mouth daily.     . diazepam (VALIUM) 5 MG tablet Take 2.5 mg by mouth every 8 (eight) hours as needed for anxiety (for nerves). Must last 30 days. Filled 11-28-17    . Fluticasone-Salmeterol (ADVAIR DISKUS) 250-50 MCG/DOSE AEPB Inhale 1 puff into the lungs every 12 (twelve) hours. 60 each 0  . gabapentin (NEURONTIN) 300 MG capsule Take 1 capsule (300 mg total) by mouth 3 (three) times daily.  0  . Glucosamine HCl (GLUCOSAMINE PO) Take 1 tablet by mouth daily.    Marland Kitchen lisinopril (PRINIVIL,ZESTRIL) 10 MG tablet Take 1 tablet (10 mg total) by mouth every morning. 30 tablet 0  . Magnesium 250 MG TABS Take 250 mg by mouth daily.    . Menthol, Topical Analgesic, (BERRI-FREEZ PAIN RELIEVING) 10 % LIQD Apply 1 application topically as needed (pain).    Marland Kitchen metoCLOPramide (REGLAN) 5 MG tablet Take 5 mg by mouth 2 (two) times daily.     . metoprolol tartrate (LOPRESSOR) 25 MG tablet Take 0.5 tablets (12.5 mg total) by mouth 2 (two) times daily. 60 tablet 0  . Multiple Vitamins-Minerals (WOMENS 50+ ADVANCED PO) Take 1 tablet by mouth daily.    Marland Kitchen  pantoprazole (PROTONIX) 40 MG tablet Take 1 tablet (40 mg total) by mouth daily at 6 (six) AM. 30 tablet 0  . raloxifene (EVISTA) 60 MG tablet Take 60 mg by mouth daily.    . sertraline (ZOLOFT) 100 MG tablet Take 100 mg by mouth daily.    Marland Kitchen tiotropium (SPIRIVA) 18 MCG inhalation capsule Place 18 mcg into inhaler and inhale daily.      . traMADol (ULTRAM) 50 MG tablet Take 50 mg by mouth 3 (three) times daily as needed.     No current facility-administered medications for this visit.     SURGICAL HISTORY:  Past Surgical History:  Procedure Laterality Date  . ANKLE SURGERY  Right    horse accident  . APPENDECTOMY    . BACK SURGERY    . CHOLECYSTECTOMY    . EYE SURGERY     lense implant  . FRACTURE SURGERY    . HIP SURGERY  01/2010   Left Hip   . INNER EAR SURGERY Bilateral 1970   related to severe ear infections  . KNEE SURGERY     Right knee  . LEFT HEART CATH AND CORONARY ANGIOGRAPHY N/A 12/11/2017   Procedure: LEFT HEART CATH AND CORONARY ANGIOGRAPHY;  Surgeon: Jettie Booze, MD;  Location: [facility];  Service: Cardiovascular;  Laterality: N/A;  . LOBECTOMY Right 05/09/2016   Procedure: RIGHT UPPER LOBECTOMY;  Surgeon: [name], MD;  Location: [facility];  Service: Thoracic;  Laterality: Right;  . MASS EXCISION  08/25/2011   Procedure: EXCISION MASS;  Surgeon: Harl Bowie, MD;  Location: [facility];  Service: General;  Laterality: N/A;  excision left neck mass  . TOTAL ABDOMINAL HYSTERECTOMY    . VIDEO ASSISTED THORACOSCOPY (VATS)/WEDGE RESECTION Right 05/09/2016   Procedure: VIDEO ASSISTED THORACOSCOPY (VATS)/WEDGE RESECTION;  Surgeon: [name], MD;  Location: [facility];  Service: Thoracic;  Laterality: Right;    REVIEW OF SYSTEMS:  A comprehensive review of systems was negative except for: Respiratory: positive for dyspnea on exertion and pleurisy/chest pain   PHYSICAL EXAMINATION: General appearance: alert, cooperative and no distress Head: Normocephalic, without obvious abnormality, atraumatic Neck: no adenopathy, no JVD, supple, symmetrical, trachea midline and thyroid not enlarged, symmetric, no tenderness/mass/nodules Lymph nodes: Cervical, supraclavicular, and axillary nodes normal. Resp: clear to auscultation bilaterally Back: symmetric, no curvature. ROM normal. No CVA tenderness. Cardio: regular rate and rhythm, S1, S2 normal, no murmur, click, rub or gallop GI: soft, non-tender; bowel sounds normal; no masses,  no organomegaly Extremities: extremities normal, atraumatic, no cyanosis or  edema  ECOG PERFORMANCE STATUS: 1 - Symptomatic but completely ambulatory  Blood pressure (!) 122/59, pulse 62, temperature 97.7 F (36.5 C), temperature source Oral, resp. rate 17, height 5\' 1"  (1.549 m), SpO2 98 %.  LABORATORY DATA: Lab Results  Component Value Date   WBC 6.2 01/26/2018   HGB 12.4 01/26/2018   HCT 37.3 01/26/2018   MCV 87.6 01/26/2018   PLT 302 01/26/2018      Chemistry      Component Value Date/Time   NA 143 01/26/2018 1026   NA 141 01/17/2018 0853   NA 140 07/27/2017 0939   K 4.0 01/26/2018 1026   K 3.7 07/27/2017 0939   CL 105 01/26/2018 1026   CO2 29 01/26/2018 1026   CO2 28 07/27/2017 0939   BUN 11 01/26/2018 1026   BUN 9 01/17/2018 0853   BUN 17.0 07/27/2017 0939   CREATININE 1.25 (H) 01/26/2018  1026   CREATININE 1.3 (H) 07/27/2017 0939      Component Value Date/Time   CALCIUM 9.0 01/26/2018 1026   CALCIUM 8.8 07/27/2017 0939   ALKPHOS 51 01/26/2018 1026   ALKPHOS 52 07/27/2017 0939   AST 17 01/26/2018 1026   AST 15 07/27/2017 0939   ALT 11 01/26/2018 1026   ALT 15 07/27/2017 0939   BILITOT 0.5 01/26/2018 1026   BILITOT 0.38 07/27/2017 0939       RADIOGRAPHIC STUDIES: Dg Chest 2 View  Result Date: 01/08/2018 CLINICAL DATA:  Chest pain. Shortness of breath. Cough and dizziness. Partial lobectomy on the right. EXAM: CHEST - 2 VIEW COMPARISON:  CT chest 12/08/2017 FINDINGS: Heart is upper limits of normal for size. Atherosclerotic calcifications are noted at the aortic arch. Postsurgical changes are noted in the right lung. No superimposed disease is present. Aeration is improved prior exam. Left lung is clear. The visualized soft tissues and bony thorax are unremarkable. IMPRESSION: 1. Improved aeration of both lungs. 2. Postsurgical changes of the right lung. 3. No acute cardiopulmonary disease. Electronically Signed   By: San Morelle M.D.   On: 01/08/2018 14:14   Ct Chest Wo Contrast  Result Date: 01/26/2018 CLINICAL DATA:  Lung  cancer follow-up.  Status post EXAM: CT CHEST WITHOUT CONTRAST TECHNIQUE: Multidetector CT imaging of the chest was performed following the standard protocol without IV contrast. COMPARISON:  08/02/2017 FINDINGS: Cardiovascular: The heart size appears normal. Aortic atherosclerosis. Calcification within the LAD, RCA coronary arteries noted. Mediastinum/Nodes: Normal appearance of the thyroid gland. The trachea appears patent and is midline. Normal appearance of the esophagus. Stable 8 mm right paratracheal lymph node, image 32/2. No mediastinal or hilar adenopathy identified. Lungs/Pleura: No pleural effusions. Advanced changes of emphysema. Status post right upper lobectomy. Left lower lobe lung nodule is unchanged measuring 4 mm, image 94/7. Also stable is a nodule within the left lower lobe measuring 5 mm, image 106/7. Stable left upper lobe nodule measuring 4 mm, image 60/7. New nodule within the right lower lobe measures 4 mm, image 88/7. Nonspecific. Upper Abdomen: No acute abnormality. Musculoskeletal: The thoracic spine appears kyphotic. Mild multi level degenerative disc disease identified. No suspicious bone lesions. IMPRESSION: 1. Stable postoperative changes from right upper lobectomy. 2. Small nodules within the left lung are nonspecific and appear unchanged from previous exam. A new nonspecific nodule within the right lower lobe measures 4 mm and is also nonspecific. Attention in these nodules on follow-up imaging is recommended. 3.  Emphysema (ICD10-J43.9). 4. Aortic atherosclerosis and multi vessel coronary artery atherosclerotic calcifications. Aortic Atherosclerosis (ICD10-I70.0). Electronically Signed   By: Kerby Moors M.D.   On: 01/26/2018 13:58    ASSESSMENT AND PLAN: This is a very pleasant 77 years old white female with a stage IA non-small cell lung cancer status post right upper lobectomy with lymph node dissection and currently on observation.  The patient has no concerning  complaints except for the baseline shortness of breath and intermittent right-sided chest pain from the surgical scar. The recent CT scan of the chest showed no concerning findings for disease recurrence but there was a tiny 4 mm nodule in the right lower lobe that is nonspecific and require attention on follow-up imaging. I discussed the scan results with the patient and recommended for her to continue on observation with repeat CT scan of the chest in 6 months. She was advised to call immediately if she has any concerning symptoms in the interval. The patient voices  understanding of current disease status and treatment options and is in agreement with the current care plan. All questions were answered. The patient knows to call the clinic with any problems, questions or concerns. We can certainly see the patient much sooner if necessary.  I spent 10 minutes counseling the patient face to face. The total time spent in the appointment was 15 minutes.  Disclaimer: This note was dictated with voice recognition software. Similar sounding words can inadvertently be transcribed and may not be corrected upon review.

## 2018-02-14 NOTE — Progress Notes (Deleted)
Cardiology Office Note   Date:  02/14/2018   ID:  [name], DOB [birth year], MRN [id number]  PCP:  Wenda Low, MD  Cardiologist:  [place] PV:  Dr. Gwenlyn Found  No chief complaint on file.    History of Present Illness: [name] is a 77 y.o. female who presents for ongoing assessment and management of Taku Tsubo cardiomyopathy, cardiac cath 12/11/2017 with non-obstructive CAD, hypertension, with recent hospitalization in May of 2019 for chest pain.  She also has a history of COPD, hypercholesterolemia, video-assisted thoracotomy, history of DVT in October 2017 no longer on anticoagulation ,CKD Stage III, GERD.    When I saw her last on 01/17/2018 she complained of generalized fatigue with sharp pain under her right breast from thoracotomy. . I repeated echo for comparison now that she was on new medication regimen as last documented EF was 25%-30%.   Echocardiogram 01/23/2018  Left ventricle: The cavity size was normal. Wall thickness was   normal. Systolic function was normal. The estimated ejection   fraction was in the range of 50% to 55%. Moderate hypokinesis of   the midanteroseptal myocardium. Doppler parameters are consistent   with abnormal left ventricular relaxation (grade 1 diastolic   dysfunction).  Past Medical History:  Diagnosis Date  . Adenocarcinoma of right lung, stage 1 ([place]) 07/28/2016  . Anxiety   . Cancer ([place])    uterine  . COPD (chronic obstructive pulmonary disease) ([place])   . Cyst    left side of neck  . Depression   . GERD (gastroesophageal reflux disease)   . Heart murmur    per patient  . Hyperlipidemia   . Hypertension   . Low back pain   . Lumbar back pain   . Osteoarthritis   . Osteoporosis     Past Surgical History:  Procedure Laterality Date  . ANKLE SURGERY Right    horse accident  . APPENDECTOMY    . BACK SURGERY    . CHOLECYSTECTOMY    . EYE SURGERY     lense implant  . FRACTURE SURGERY    . HIP SURGERY  01/2010   Left Hip   . INNER EAR SURGERY Bilateral 1970   related to severe ear infections  . KNEE SURGERY     Right knee  . LEFT HEART CATH AND CORONARY ANGIOGRAPHY N/A 12/11/2017   Procedure: LEFT HEART CATH AND CORONARY ANGIOGRAPHY;  Surgeon: Jettie Booze, MD;  Location: [facility];  Service: Cardiovascular;  Laterality: N/A;  . LOBECTOMY Right 05/09/2016   Procedure: RIGHT UPPER LOBECTOMY;  Surgeon: [name], MD;  Location: [facility];  Service: Thoracic;  Laterality: Right;  . MASS EXCISION  08/25/2011   Procedure: EXCISION MASS;  Surgeon: Harl Bowie, MD;  Location: [facility];  Service: General;  Laterality: N/A;  excision left neck mass  . TOTAL ABDOMINAL HYSTERECTOMY    . VIDEO ASSISTED THORACOSCOPY (VATS)/WEDGE RESECTION Right 05/09/2016   Procedure: VIDEO ASSISTED THORACOSCOPY (VATS)/WEDGE RESECTION;  Surgeon: [name], MD;  Location: [facility];  Service: Thoracic;  Laterality: Right;     Current Outpatient Medications  Medication Sig Dispense Refill  . albuterol (PROVENTIL HFA;VENTOLIN HFA) 108 (90 BASE) MCG/ACT inhaler Inhale 2 puffs into the lungs every 6 (six) hours as needed. (Patient taking differently: Inhale 2 puffs into the lungs every 4 (four) hours as needed for wheezing or shortness of breath. ) 18 g 1  . albuterol (PROVENTIL) (2.5 MG/3ML)  0.083% nebulizer solution Take 3 mLs (2.5 mg total) by nebulization every 2 (two) hours as needed for wheezing or shortness of breath. 75 mL 12  . aluminum-magnesium hydroxide-simethicone (MAALOX) 240-973-53 MG/5ML SUSP Take 30 mLs by mouth as needed (indigestion).    Marland Kitchen atorvastatin (LIPITOR) 20 MG tablet Take 20 mg by mouth daily.     . diazepam (VALIUM) 5 MG tablet Take 2.5 mg by mouth every 8 (eight) hours as needed for anxiety (for nerves). Must last 30 days. Filled 11-28-17    . Fluticasone-Salmeterol (ADVAIR DISKUS) 250-50 MCG/DOSE AEPB Inhale 1 puff into the lungs every 12 (twelve)  hours. 60 each 0  . gabapentin (NEURONTIN) 300 MG capsule Take 1 capsule (300 mg total) by mouth 3 (three) times daily.  0  . Glucosamine HCl (GLUCOSAMINE PO) Take 1 tablet by mouth daily.    Marland Kitchen lisinopril (PRINIVIL,ZESTRIL) 10 MG tablet Take 1 tablet (10 mg total) by mouth every morning. 30 tablet 0  . Magnesium 250 MG TABS Take 250 mg by mouth daily.    . Menthol, Topical Analgesic, (BERRI-FREEZ PAIN RELIEVING) 10 % LIQD Apply 1 application topically as needed (pain).    Marland Kitchen metoCLOPramide (REGLAN) 5 MG tablet Take 5 mg by mouth 2 (two) times daily.     . metoprolol tartrate (LOPRESSOR) 25 MG tablet Take 0.5 tablets (12.5 mg total) by mouth 2 (two) times daily. 60 tablet 0  . Multiple Vitamins-Minerals (WOMENS 50+ ADVANCED PO) Take 1 tablet by mouth daily.    . pantoprazole (PROTONIX) 40 MG tablet Take 1 tablet (40 mg total) by mouth daily at 6 (six) AM. 30 tablet 0  . raloxifene (EVISTA) 60 MG tablet Take 60 mg by mouth daily.    . sertraline (ZOLOFT) 100 MG tablet Take 100 mg by mouth daily.    Marland Kitchen tiotropium (SPIRIVA) 18 MCG inhalation capsule Place 18 mcg into inhaler and inhale daily.      . traMADol (ULTRAM) 50 MG tablet Take 50 mg by mouth 3 (three) times daily as needed.     No current facility-administered medications for this visit.     Allergies:   Boniva [ibandronate sodium] and Lyrica [pregabalin]    Social History:  The patient  reports that she quit smoking about 5 years ago. Her smoking use included cigarettes. She has a 102.00 pack-year smoking history. She has never used smokeless tobacco. She reports that she does not drink alcohol or use drugs.   Family History:  The patient's family history includes Cancer in her sister; Heart disease in her brother, father, and mother.    ROS: All other systems are reviewed and negative. Unless otherwise mentioned in H&P    PHYSICAL EXAM: VS:  There were no vitals taken for this visit. , BMI There is no height or weight on file to  calculate BMI. GEN: Well nourished, well developed, in no acute distress  HEENT: normal  Neck: no JVD, carotid bruits, or masses Cardiac: ***RRR; no murmurs, rubs, or gallops,no edema  Respiratory:  clear to auscultation bilaterally, normal work of breathing GI: soft, nontender, nondistended, + BS MS: no deformity or atrophy  Skin: warm and dry, no rash Neuro:  Strength and sensation are intact Psych: euthymic mood, full affect   EKG:  EKG {ACTION; IS/IS [id number]} ordered today. The ekg ordered today demonstrates ***   Recent Labs: 12/08/2017: B Natriuretic Peptide 116.2 01/17/2018: Magnesium 2.2 01/26/2018: ALT 11; BUN 11; Creatinine, Ser 1.25; Hemoglobin 12.4; Platelet Count 302; Potassium 4.0;  Sodium 143    Lipid Panel No results found for: CHOL, TRIG, HDL, CHOLHDL, VLDL, LDLCALC, LDLDIRECT    Wt Readings from Last 3 Encounters:  01/17/18 150 lb 12.8 oz (68.4 kg)  12/11/17 160 lb 15 oz (73 kg)  08/03/17 167 lb 6.4 oz (75.9 kg)      Other studies Reviewed: Cardiac Catheterization 12/11/2017  Prox RCA lesion is 25% stenosed.  Mid LAD lesion is 25% stenosed.  There is moderate left ventricular systolic dysfunction.  LV end diastolic pressure is moderately elevated.  The left ventricular ejection fraction is 25-35% by visual estimate, with apical hypokinesis.  There is no aortic valve stenosis.  Nonobstructive CAD. Takotsubo cardiomyopathy pattern. Continue medical therapy. ASSESSMENT AND PLAN:  1.  ***   Current medicines are reviewed at length with the patient today.    Labs/ tests ordered today include: *** Phill Myron. [place] Pugh, ANP, AACC   02/14/2018 3:21 PM    [facility] [address] [place], [place] [address] Phone: [phone]; Fax: [phone]

## 2018-02-15 ENCOUNTER — Ambulatory Visit: Payer: Medicare Other | Admitting: Adult Health

## 2018-02-16 ENCOUNTER — Encounter: Payer: Self-pay | Admitting: *Deleted

## 2018-02-23 NOTE — Progress Notes (Deleted)
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 ([place]), Cyst, Depression, GERD (gastroesophageal reflux disease), Heart murmur, Hyperlipidemia, Hypertension, Low back pain, Lumbar back pain, Osteoarthritis, and Osteoporosis. Surgical: [name]  has a past surgical history that includes Cholecystectomy; Appendectomy; Total abdominal hysterectomy; Back surgery; Knee surgery; Hip surgery (01/2010); Mass excision (08/25/2011); Ankle surgery (Right); Fracture surgery; Inner ear surgery (Bilateral, 1970); Eye surgery; Video assisted thoracoscopy (vats)/wedge resection (Right, 05/09/2016); Lobectomy (Right, 05/09/2016); and LEFT HEART CATH AND CORONARY ANGIOGRAPHY (N/A, 12/11/2017). Family: family history includes Cancer in her sister; Heart disease in her brother, father, and mother.  Constitutional Exam  General appearance: Well nourished, well developed, and well hydrated. In no apparent acute distress There were no vitals filed for  this visit. BMI Assessment: Estimated body mass index is 28.49 kg/m as calculated from the following:   Height as of 01/29/18: '5\' 1"'  (1.549 m).   Weight as of 01/17/18: 150 lb 12.8 oz (68.4 kg).  BMI interpretation table: BMI level Category Range association with higher incidence of chronic pain  <18 kg/m2 Underweight   18.5-24.9 kg/m2 Ideal body weight   25-29.9 kg/m2 Overweight Increased incidence by 20%  30-34.9 kg/m2 Obese (Class I) Increased incidence by 68%  35-39.9 kg/m2 Severe obesity (Class II) Increased incidence by 136%  >40 kg/m2 Extreme obesity (Class III) Increased incidence by 254%   Patient's current BMI Ideal Body weight  There is no height or weight on file to calculate BMI. Patient weight not recorded   BMI Readings from Last 4 Encounters:  01/29/18 28.49 kg/m  01/17/18 28.49 kg/m  12/11/17 30.41 kg/m  08/03/17 31.12 kg/m   Wt Readings from Last 4 Encounters:  01/17/18 150 lb 12.8 oz (68.4 kg)  12/11/17 160 lb 15 oz (73 kg)  08/03/17 167 lb 6.4 oz (75.9 kg)  06/21/17 163 lb (73.9 kg)  Psych/Mental status: Alert, oriented x 3 (person, place, & time)       Eyes: PERLA Respiratory: No evidence of acute respiratory distress  Cervical Spine Area Exam  Skin & Axial Inspection: No masses, redness, edema, swelling, or associated skin lesions Alignment: Symmetrical Functional ROM: Unrestricted ROM      Stability: No instability detected Muscle Tone/Strength: Functionally intact. No obvious neuro-muscular anomalies detected. Sensory (Neurological): Unimpaired Palpation: No palpable anomalies  Upper Extremity (UE) Exam    Side: Right upper extremity  Side: Left upper extremity  Skin & Extremity Inspection: Skin color, temperature, and hair growth are WNL. No peripheral edema or cyanosis. No masses, redness, swelling, asymmetry, or associated skin lesions. No contractures.  Skin & Extremity Inspection: Skin color, temperature, and hair growth are WNL.  No peripheral edema or cyanosis. No masses, redness, swelling, asymmetry, or associated skin lesions. No contractures.  Functional ROM: Unrestricted ROM          Functional ROM: Unrestricted ROM          Muscle Tone/Strength: Functionally intact. No obvious neuro-muscular anomalies detected.  Muscle Tone/Strength: Functionally intact. No obvious neuro-muscular anomalies detected.  Sensory (Neurological): Unimpaired          Sensory (Neurological): Unimpaired          Palpation: No palpable anomalies              Palpation: No palpable anomalies              Provocative Test(s):  Phalen's test: deferred Tinel's test: deferred Apley's scratch test (touch opposite shoulder):  Action 1 (Across chest): deferred Action 2 (Overhead): deferred Action 3 (LB reach): deferred   Provocative Test(s):  Phalen's test: deferred Tinel's test: deferred Apley's scratch test (touch opposite shoulder):  Action 1 (Across chest): deferred Action 2 (Overhead): deferred Action 3 (LB reach): deferred    Thoracic Spine Area Exam  Skin & Axial Inspection: No masses, redness, or swelling Alignment: Symmetrical Functional ROM: Unrestricted ROM Stability: No instability detected Muscle Tone/Strength: Functionally intact. No obvious neuro-muscular anomalies detected. Sensory (Neurological): Unimpaired Muscle strength & Tone: No palpable anomalies  Lumbar Spine Area Exam  Skin & Axial Inspection: No masses, redness, or swelling Alignment: Symmetrical Functional ROM: Unrestricted ROM       Stability: No instability detected Muscle Tone/Strength: Functionally intact. No obvious neuro-muscular anomalies detected. Sensory (Neurological): Unimpaired Palpation: No palpable anomalies       Provocative Tests: Hyperextension/rotation test: deferred today       Lumbar quadrant test (Kemp's test): deferred today       Lateral bending test: deferred today       Patrick's Maneuver: deferred today                    FABER test: deferred today                   S-I anterior distraction/compression test: deferred today         S-I lateral compression test: deferred today         S-I Thigh-thrust test: deferred today         S-I Gaenslen's test: deferred today         Gait & Posture Assessment  Ambulation: Unassisted Gait: Relatively normal for age and body habitus Posture: WNL   Lower Extremity Exam    Side: Right lower extremity  Side: Left lower extremity  Stability: No instability observed          Stability: No instability observed          Skin & Extremity Inspection: Skin color, temperature, and hair growth are WNL. No peripheral edema or cyanosis. No masses, redness, swelling, asymmetry, or associated skin lesions. No contractures.  Skin & Extremity Inspection: Skin color, temperature, and hair growth are WNL. No peripheral edema or cyanosis. No masses, redness, swelling, asymmetry, or associated skin lesions. No contractures.  Functional ROM: Unrestricted ROM  Functional ROM: Unrestricted ROM                  Muscle Tone/Strength: Functionally intact. No obvious neuro-muscular anomalies detected.  Muscle Tone/Strength: Functionally intact. No obvious neuro-muscular anomalies detected.  Sensory (Neurological): Unimpaired  Sensory (Neurological): Unimpaired  Palpation: No palpable anomalies  Palpation: No palpable anomalies   Assessment & Plan  Primary Diagnosis & Pertinent Problem List: The primary encounter diagnosis was Chronic pain syndrome. Diagnoses of Cancer related pain, Chronic chest wall pain (Primary Area of Pain) (Right), Chronic post-thoracotomy pain, Post-thoracotomy pain syndrome (Right), Chronic low back pain (Bilateral), Failed back surgical syndrome, Chronic lower extremity pain (Tertiary Area of Pain) (Bilateral) (R>L), Chronic hip pain (Fourth Area of Pain) (Bilateral) (R>L), Chronic sacroiliac joint pain, Neurogenic pain, Pharmacologic therapy, Disorder of  skeletal system, Problems influencing health status, Long term prescription benzodiazepine use, and Long term prescription opiate use were also pertinent to this visit.  Visit Diagnosis: 1. Chronic pain syndrome   2. Cancer related pain   3. Chronic chest wall pain (Primary Area of Pain) (Right)   4. Chronic post-thoracotomy pain   5. Post-thoracotomy pain syndrome (Right)   6. Chronic low back pain (Bilateral)   7. Failed back surgical syndrome   8. Chronic lower extremity pain (Tertiary Area of Pain) (Bilateral) (R>L)   9. Chronic hip pain (Fourth Area of Pain) (Bilateral) (R>L)   10. Chronic sacroiliac joint pain   11. Neurogenic pain   12. Pharmacologic therapy   13. Disorder of skeletal system   14. Problems influencing health status   15. Long term prescription benzodiazepine use   16. Long term prescription opiate use    Problems updated and reviewed during this visit: Problem  Chronic Pain Syndrome  Cancer Related Pain  Post-thoracotomy pain syndrome (Right)  Chronic low back pain (Bilateral)  Failed Back Surgical Syndrome  Chronic lower extremity pain (Tertiary Area of Pain) (Bilateral) (R>L)  Chronic hip pain (Fourth Area of Pain) (Bilateral) (R>L)  Neurogenic Pain  History of Uterine Cancer  Substernal Chest Pain  Embedded Tick of Upper Back Excluding Scapular Region  Chronic Sacroiliac Joint Pain  Chronic chest wall pain (Primary Area of Pain) (Right)  Chronic Post-Thoracotomy Pain   History of right upper lobectomy for adenocarcinoma of lung in 2017   Neuropathic Pain Syndrome (Non-Herpetic)  Adenocarcinoma of Right Lung, Stage 1 (H[facility])  Pharmacologic Therapy  Problems Influencing Health Status  Long Term Prescription Benzodiazepine Use  Long Term Prescription Opiate Use  Non-St Elevation (Nstemi) Myocardial Infarction (Hcc)  Elevated Brain Natriuretic Peptide (Bnp) Level  Disorder of Skeletal System  Other Long Term (Current) Drug Therapy  Other Specified  Health Status  Dvt (Deep Venous Thrombosis) (Hcc)   Left calf after hip surgery 2012   CKD (chronic kidney disease), stage III (HCC)  Acute Systolic Heart Failure (Hcc)  Generalized Anxiety Disorder  Gerd (Gastroesophageal Reflux Disease)  Elevated Troponin  S/P Lobectomy of Lung  Heart Murmur   per patient   Acute Exacerbation of Chronic Obstructive Pulmonary Disease (Copd) (Hcc)  Essential Hypertension  Hypercholesterolemia  Copd (Chronic Obstructive Pulmonary Disease) (Hcc)  Acute On Chronic Respiratory Failure With Hypoxia (Hcc)  Infected Sebaceous Cyst  Aki (Acute Kidney Injury) (Hcc) (Resolved)    Plan of Care  Pharmacotherapy (Medications Ordered): No orders of the defined types were placed in this encounter.  Procedure Orders    No procedure(s) ordered today   Lab Orders  No laboratory test(s) ordered today  Imaging Orders  No imaging studies ordered today   Referral Orders  No referral(s) requested today    Pharmacological management options:  Opioid Analgesics: We'll take over management today. See above orders Membrane stabilizer: We have discussed the possibility of optimizing this mode of therapy, if tolerated Muscle relaxant: We have discussed the possibility of a trial NSAID: We have discussed the possibility of a trial Other analgesic(s): To be determined at a later time   Interventional management options: Planned, scheduled, and/or pending:    ***   Considering:   Diagnostic Right-sided thoracic nerve block  Possible right-sided thoracic artifact  Diagnostic bilateral lumbar ESI  Diagnostic bilateral lumbar facet nerve block  Possible bilateral lumbar facet RFA  Diagnostic bilateral SI joint nerve block  Possible bilateral SI joint RFA    PRN Procedures:   None at this time   Provider-requested follow-up: No follow-ups on file.  Future Appointments  Date Time Provider [place]  02/26/2018 10:30 AM Milinda Pointer, MD  [facility] None  05/29/2018  9:30 AM [name], MD [facility]  07/30/2018 12:00 PM [facility] None  08/01/2018 11:00 AM Curt Bears, MD [facility] None    Primary Care Physician: Wenda Low, MD Location: [facility] Note by: Gaspar Cola, MD Date: 02/26/2018; Time: 7:49 AM

## 2018-02-26 ENCOUNTER — Ambulatory Visit: Payer: Medicare Other | Admitting: Pain Medicine

## 2018-02-26 DIAGNOSIS — M25552 Pain in left hip: Secondary | ICD-10-CM

## 2018-02-26 DIAGNOSIS — M25551 Pain in right hip: Secondary | ICD-10-CM

## 2018-02-26 DIAGNOSIS — M79604 Pain in right leg: Secondary | ICD-10-CM

## 2018-02-26 DIAGNOSIS — M79605 Pain in left leg: Secondary | ICD-10-CM

## 2018-02-26 DIAGNOSIS — M792 Neuralgia and neuritis, unspecified: Secondary | ICD-10-CM | POA: Insufficient documentation

## 2018-02-26 DIAGNOSIS — M545 Low back pain, unspecified: Secondary | ICD-10-CM | POA: Insufficient documentation

## 2018-02-26 DIAGNOSIS — G893 Neoplasm related pain (acute) (chronic): Secondary | ICD-10-CM | POA: Insufficient documentation

## 2018-02-26 DIAGNOSIS — G894 Chronic pain syndrome: Secondary | ICD-10-CM | POA: Insufficient documentation

## 2018-02-26 DIAGNOSIS — Z79899 Other long term (current) drug therapy: Secondary | ICD-10-CM | POA: Insufficient documentation

## 2018-02-26 DIAGNOSIS — G8912 Acute post-thoracotomy pain: Secondary | ICD-10-CM | POA: Insufficient documentation

## 2018-02-26 DIAGNOSIS — G8929 Other chronic pain: Secondary | ICD-10-CM | POA: Insufficient documentation

## 2018-02-26 DIAGNOSIS — M961 Postlaminectomy syndrome, not elsewhere classified: Secondary | ICD-10-CM | POA: Insufficient documentation

## 2018-02-26 DIAGNOSIS — Z789 Other specified health status: Secondary | ICD-10-CM | POA: Insufficient documentation

## 2018-02-26 DIAGNOSIS — Z79891 Long term (current) use of opiate analgesic: Secondary | ICD-10-CM | POA: Insufficient documentation

## 2018-03-13 ENCOUNTER — Ambulatory Visit
Admission: RE | Admit: 2018-03-13 | Discharge: 2018-03-13 | Disposition: A | Discharge status: Home / Self Care | Payer: Medicare Other | Source: Ambulatory Visit | Attending: Internal Medicine | Admitting: Internal Medicine

## 2018-03-13 ENCOUNTER — Other Ambulatory Visit: Payer: Self-pay | Admitting: Internal Medicine

## 2018-03-13 DIAGNOSIS — R0789 Other chest pain: Secondary | ICD-10-CM

## 2018-03-13 IMAGING — DX DG RIBS W/ CHEST 3+V BILAT
5 series · 5 of 5 positions shown · non-contrast
Comparison: CT scan of the chest [DATE] and chest x-ray [DATE].

CLINICAL DATA: Six-month history of bilateral chest wall pain
associated with shortness of breath. History of right-sided lung
malignancy in [DATE] treated with wedge resection and upper
lobectomy, COPD, also history of uterine malignancy. Former smoker.

EXAM:
BILATERAL RIBS AND CHEST - 4+ VIEW

[dg ribs bilateral w/chest (1 of 5)]
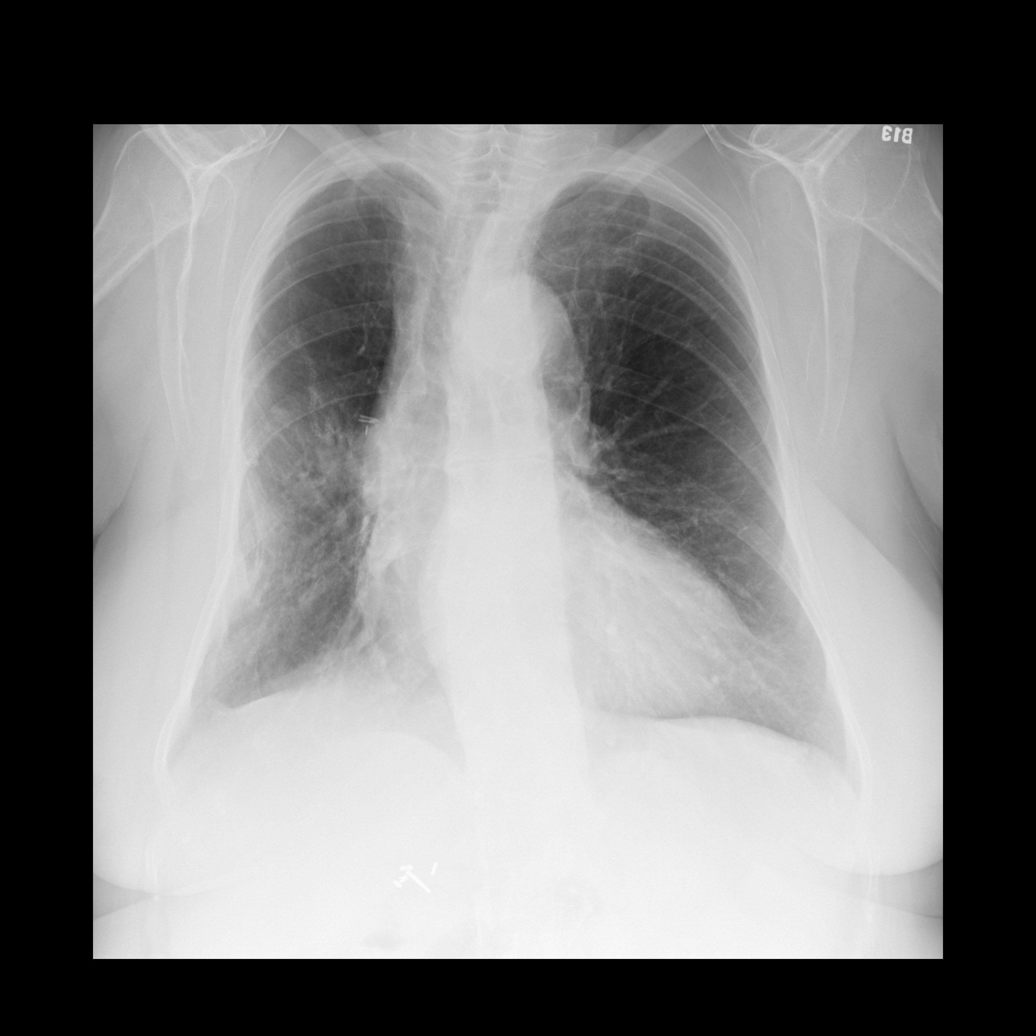

[dg ribs bilateral w/chest (2 of 5)]
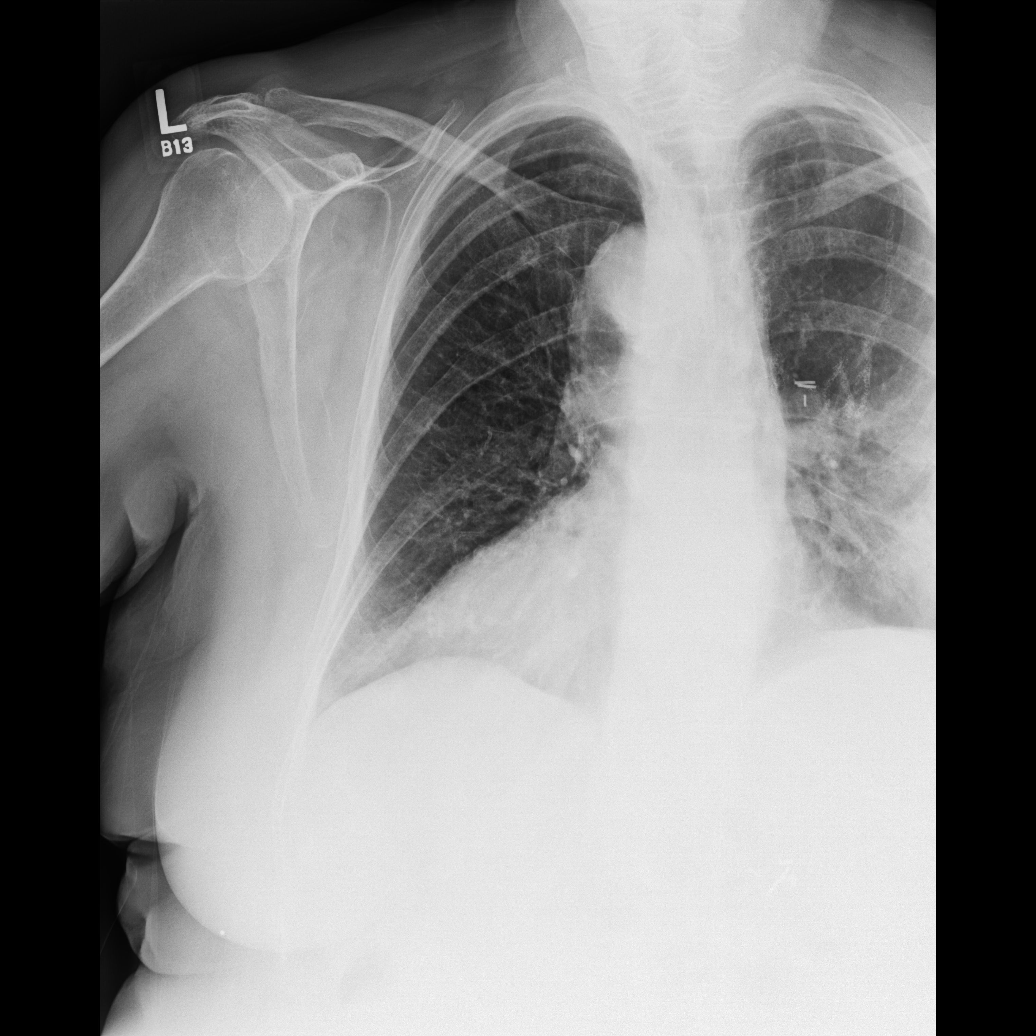

[dg ribs bilateral w/chest (3 of 5)]
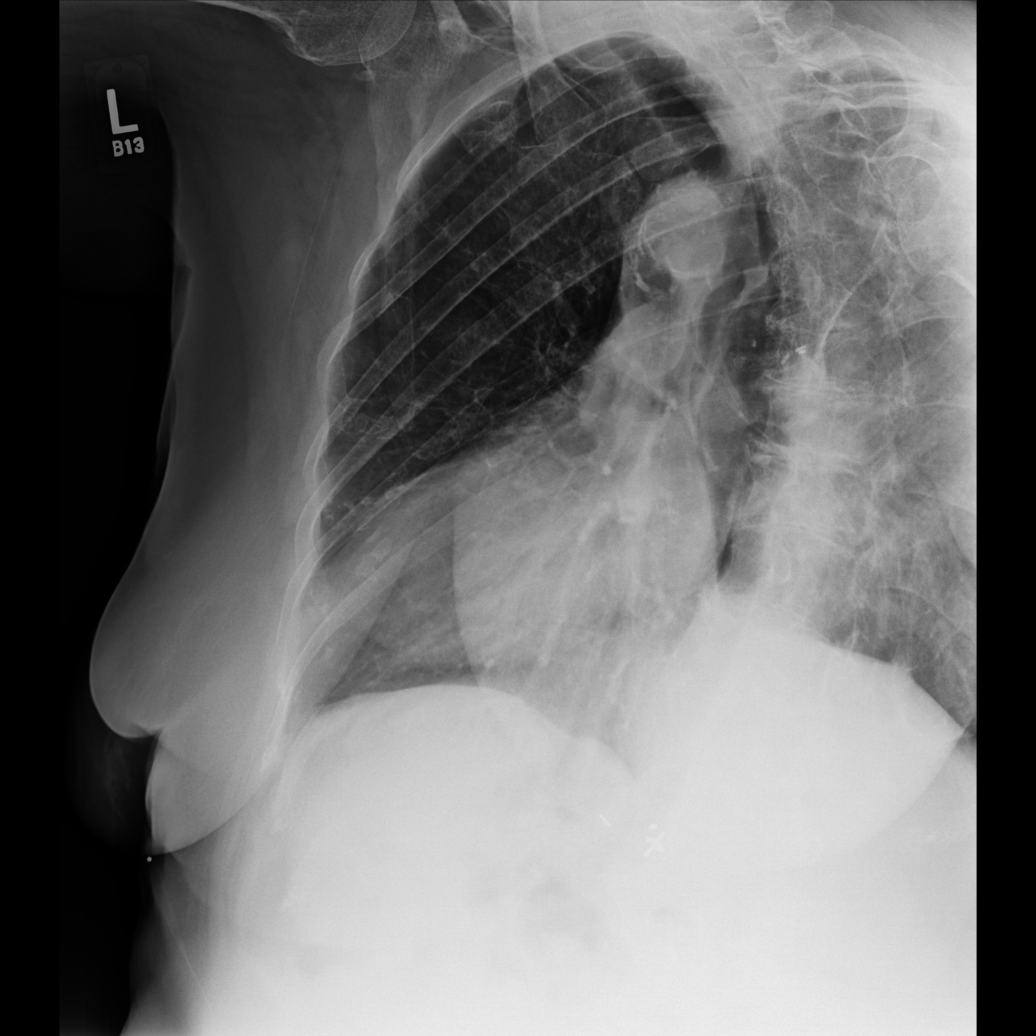

[dg ribs bilateral w/chest (4 of 5)]
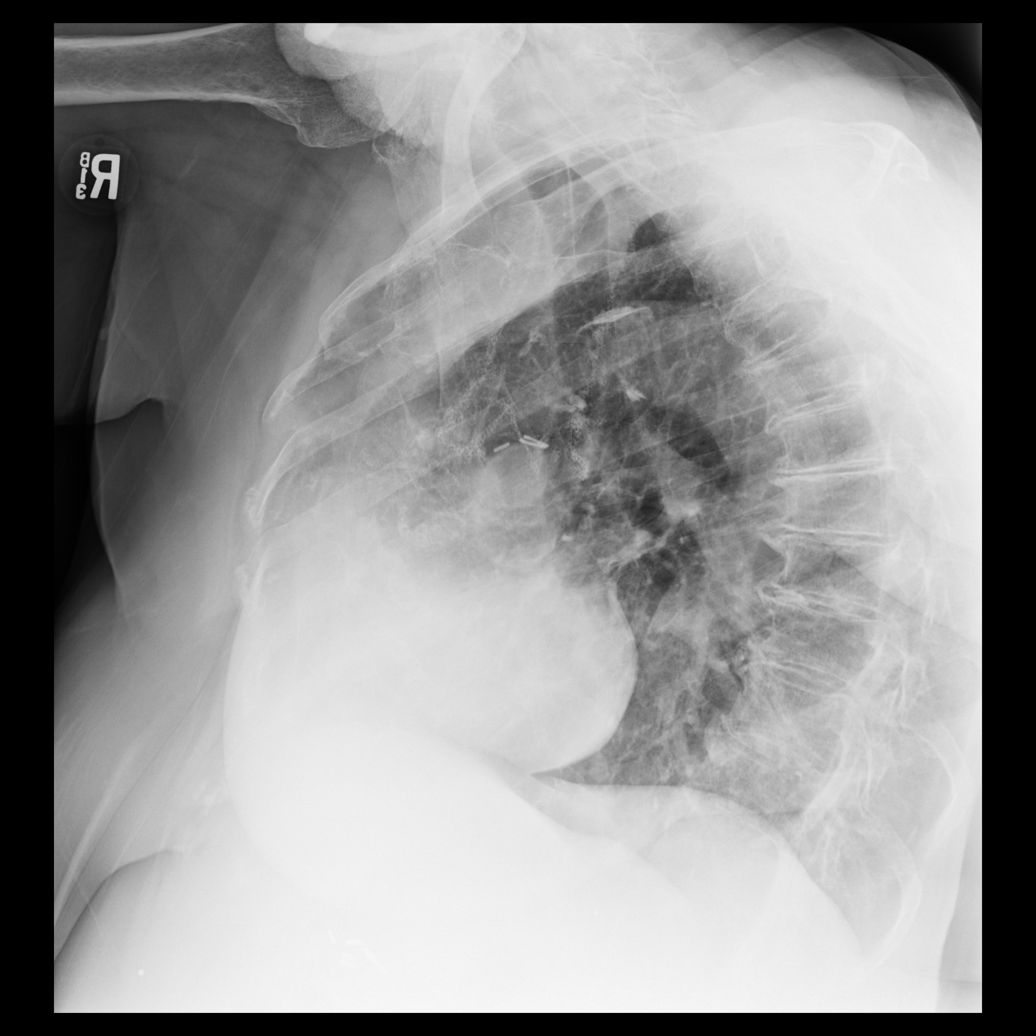

[dg ribs bilateral w/chest (5 of 5)]
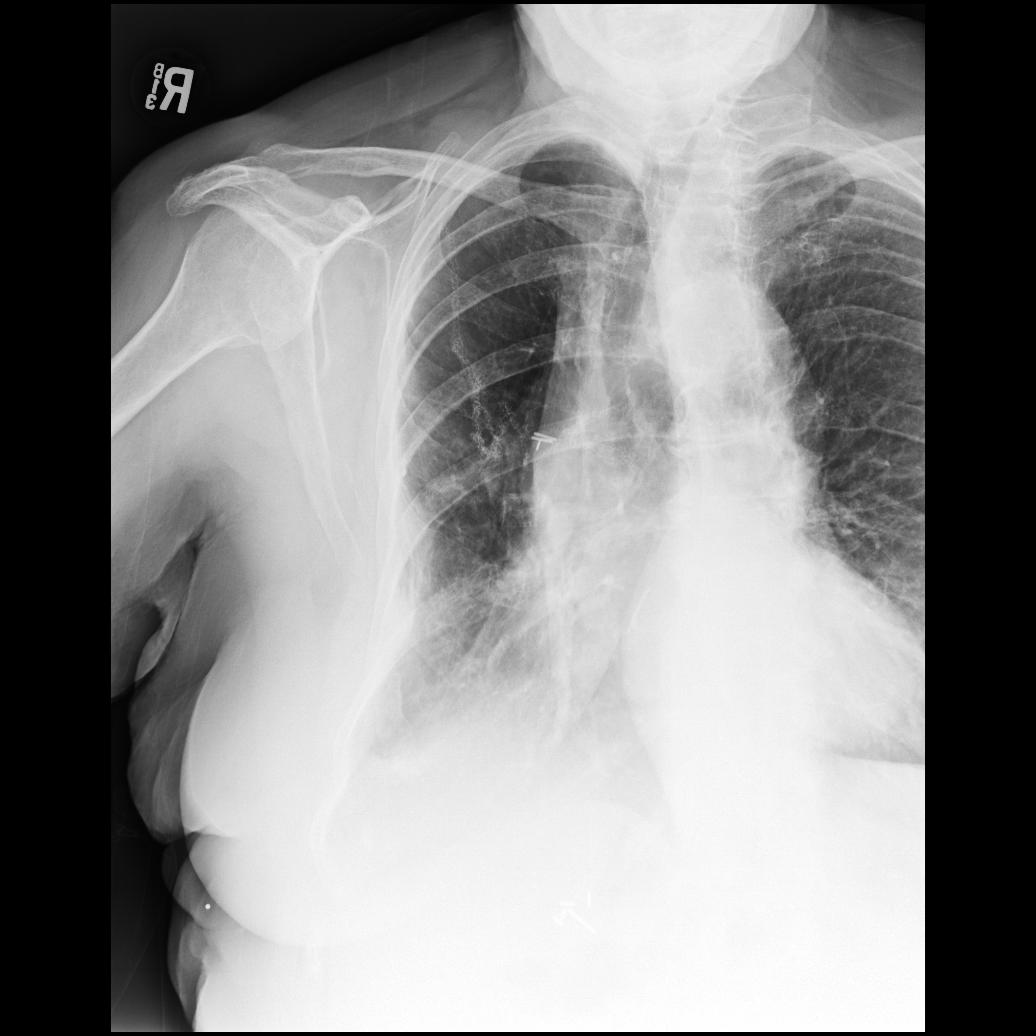

[5 of 5 positions shown; findings below may reference images not displayed]

FINDINGS: There left lung is well-expanded and clear. There is mild shift of
mediastinum from left to right due to volume loss. There are coarse
lung markings in the right perihilar region and more peripherally in
the right mid and lower lung. There surgical clips present here.
There is no pleural effusion. The cardiac silhouette is enlarged but
stable. The pulmonary vascularity is normal. There is calcification
in the wall of the aortic arch. There is multilevel degenerative
disc disease of the thoracic spine.

Bilateral rib radiographs reveal no lytic or blastic lesions. No rib
fractures are observed.
IMPRESSION: Stable postoperative changes in the right hemithorax. Clear left
lung. Stable cardiomegaly without pulmonary vascular congestion.

No acute chest wall abnormality is observed. If the patient's
symptoms persist, nuclear bone scanning may be a useful next imaging
step.

Thoracic aortic atherosclerosis.

## 2018-03-14 ENCOUNTER — Other Ambulatory Visit: Payer: Self-pay | Admitting: Internal Medicine

## 2018-03-14 DIAGNOSIS — R0789 Other chest pain: Secondary | ICD-10-CM

## 2018-03-19 ENCOUNTER — Encounter (HOSPITAL_COMMUNITY)
Admission: RE | Admit: 2018-03-19 | Discharge: 2018-03-19 | Disposition: A | Discharge status: Home / Self Care | Payer: Medicare Other | Source: Ambulatory Visit | Attending: Internal Medicine | Admitting: Internal Medicine

## 2018-03-19 DIAGNOSIS — R0789 Other chest pain: Secondary | ICD-10-CM | POA: Insufficient documentation

## 2018-03-19 IMAGING — NM NM BONE WHOLE BODY
4 series · 4 of 4 positions shown · non-contrast
Comparison: Chest CT [DATE]

CLINICAL DATA: Chest wall pain, left upper quadrant under the
breast. Pain began 3 days ago, atraumatic.

EXAM:
NUCLEAR MEDICINE WHOLE BODY BONE SCAN
TECHNIQUE: Whole body anterior and posterior images were obtained approximately
3 hours after intravenous injection of radiopharmaceutical.
RADIOPHARMACEUTICALS:  21.6 mCi [HV] MDP IV

[Series 1: whole body · 2.66mm/px · 1 of 1 slices shown (1 of 2)]
[im 1/1]
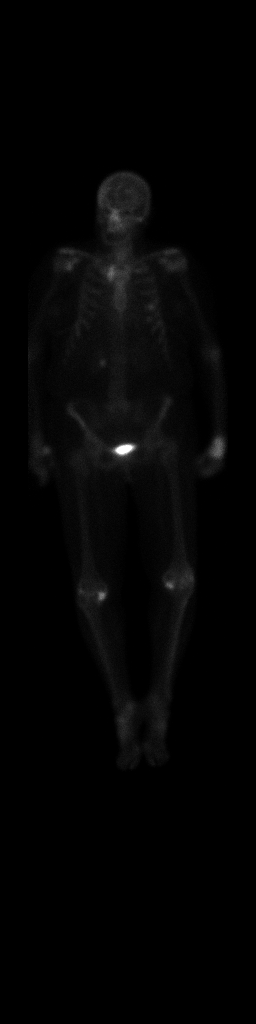

[Series 1: whole body · 2.66mm/px · 1 of 1 slices shown (2 of 2)]
[im 1/1]
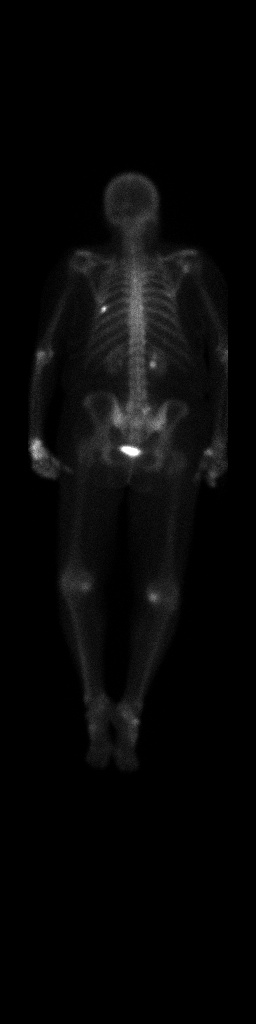

[Series 2: bone static · 2.07mm/px · 1 of 1 slices shown (1 of 2)]
[im 1/1]
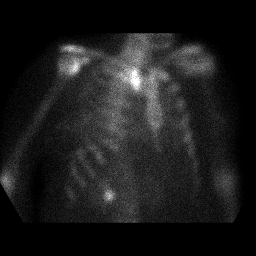

[Series 2: bone static · 2.07mm/px · 1 of 1 slices shown (2 of 2)]
[im 1/1]
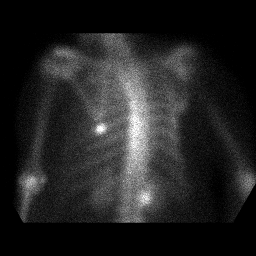

[4 of 4 positions shown; findings below may reference images not displayed]

FINDINGS: Normal distribution of tracer. There is focal rib activity on the
left posteriorly localizing to the eighth rib when numbered from
below. There is also subtle activity over the left anterior fourth
rib.

Activity at the left more than right hand/wrist is attributed to
degenerative change. Focal activity at costochondral junction of the
right first rib sternal articulation is associated with spurring and
degeneration by CT. Degenerative activity seen at the hips and
knees.
IMPRESSION: Abnormal activity at the posterior left eighth rib and possibly at
the anterior left fourth rib. In isolation these are favored to be
posttraumatic, no fracture or metastasis seen in these areas on
[DATE] chest CT. Recommend attention to these areas on follow-up
chest CT.

## 2018-03-19 MED ORDER — TECHNETIUM TC 99M MEDRONATE IV KIT
21.6000 | PACK | Freq: Once | INTRAVENOUS | Status: AC | PRN
  Administered 2018-03-19: 21.6 via INTRAVENOUS

## 2018-03-20 DIAGNOSIS — M47817 Spondylosis without myelopathy or radiculopathy, lumbosacral region: Secondary | ICD-10-CM | POA: Insufficient documentation

## 2018-03-20 DIAGNOSIS — M5388 Other specified dorsopathies, sacral and sacrococcygeal region: Secondary | ICD-10-CM | POA: Insufficient documentation

## 2018-03-20 DIAGNOSIS — R0781 Pleurodynia: Secondary | ICD-10-CM | POA: Insufficient documentation

## 2018-03-20 DIAGNOSIS — M5136 Other intervertebral disc degeneration, lumbar region: Secondary | ICD-10-CM | POA: Insufficient documentation

## 2018-03-20 DIAGNOSIS — S32591S Other specified fracture of right pubis, sequela: Secondary | ICD-10-CM | POA: Insufficient documentation

## 2018-03-20 DIAGNOSIS — S72002S Fracture of unspecified part of neck of left femur, sequela: Secondary | ICD-10-CM | POA: Insufficient documentation

## 2018-03-20 DIAGNOSIS — M47816 Spondylosis without myelopathy or radiculopathy, lumbar region: Secondary | ICD-10-CM | POA: Insufficient documentation

## 2018-03-20 NOTE — Progress Notes (Addendum)
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 tablet, Take 5 mg by mouth 2 (two) times daily. , Disp: , Rfl:  .  metoprolol tartrate (LOPRESSOR) 25 MG tablet, Take 0.5 tablets (12.5 mg total) by mouth 2 (two) times daily., Disp: 60 tablet, Rfl: 0 .  Multiple Vitamins-Minerals (WOMENS 50+ ADVANCED PO), Take 1 tablet by mouth daily., Disp: , Rfl:  .  pantoprazole (PROTONIX) 40 MG tablet, Take 1 tablet (40 mg total) by mouth daily at 6 (six) AM., Disp: 30 tablet, Rfl: 0 .  raloxifene (EVISTA) 60 MG tablet, Take 60 mg by mouth daily., Disp: , Rfl:  .  sertraline (ZOLOFT) 100 MG tablet, Take 100 mg by mouth daily., Disp: , Rfl:  .  tiotropium (SPIRIVA) 18 MCG inhalation capsule, Place 18 mcg into inhaler and inhale daily.  , Disp: , Rfl:  .  traMADol (ULTRAM) 50 MG tablet, Take 50 mg by mouth 3 (three) times daily as needed., Disp: , Rfl:  .  predniSONE (DELTASONE) 20 MG tablet, Take 3 tab(s) in the  morning x 3 days, then 2 tab(s) x 3 days, followed by 1 tab x 3 days., Disp: 21 tablet, Rfl: 0  ROS  Constitutional: Denies any fever or chills Gastrointestinal: No reported hemesis, hematochezia, vomiting, or acute GI distress Musculoskeletal: Denies any acute onset joint swelling, redness, loss of ROM, or weakness Neurological: No reported episodes of acute onset apraxia, aphasia, dysarthria, agnosia, amnesia, paralysis, loss of coordination, or loss of consciousness  Allergies  Ms. Wakeley is allergic to boniva [ibandronate sodium] and lyrica [pregabalin].  [place]  Drug: Ms. Morency  reports that she does not use drugs. Alcohol:  reports that she does not drink alcohol. Tobacco:  reports that she quit smoking about 5 years ago. Her smoking use included cigarettes. She has a 102.00 pack-year smoking history. She has never used smokeless tobacco. Medical:  has a past medical history of Adenocarcinoma of right lung, stage 1 ([place]) (07/28/2016), Anxiety, Cancer ([place]), COPD (chronic obstructive pulmonary disease) ([place]), Cyst, Depression, GERD (gastroesophageal reflux disease), Heart murmur, Hyperlipidemia, Hypertension, Low back pain, Lumbar back pain, Osteoarthritis, and Osteoporosis. Surgical: Ms. Finstad  has a past surgical history that includes Cholecystectomy; Appendectomy; Total abdominal hysterectomy; Back surgery; Knee surgery; Hip surgery (01/2010); Mass excision (08/25/2011); Ankle surgery (Right); Fracture surgery; Inner ear surgery (Bilateral, 1970); Eye surgery; Video assisted thoracoscopy (vats)/wedge resection (Right, 05/09/2016); Lobectomy (Right, 05/09/2016); and LEFT HEART CATH AND CORONARY ANGIOGRAPHY (N/A, 12/11/2017). Family: family history includes Cancer in her sister; Heart disease in her brother, father, and mother.  Constitutional Exam  General appearance: Well nourished, well developed, and well hydrated. In no apparent acute distress Vitals:   03/21/18 1039  BP: 132/87   Pulse: 87  Temp: 98.7 F (37.1 C)  SpO2: 98%  Weight: 159 lb (72.1 kg)  Height: '5\' 2"'  (1.575 m)   BMI Assessment: Estimated body mass index is 29.08 kg/m as calculated from the following:   Height as of this encounter: '5\' 2"'  (1.575 m).   Weight as of this encounter: 159 lb (72.1 kg).  BMI interpretation table: BMI level Category Range association with higher incidence of chronic pain  <18 kg/m2 Underweight   18.5-24.9 kg/m2 Ideal body weight   25-29.9 kg/m2 Overweight Increased incidence by 20%  30-34.9 kg/m2 Obese (Class I) Increased incidence by 68%  35-39.9 kg/m2 Severe obesity (Class II) Increased incidence by 136%  >40 kg/m2 Extreme obesity (Class III) Increased incidence by 254%   Patient's current BMI Ideal Body weight  Body mass index is 29.08 kg/m. Ideal body weight: 50.1 kg (110 lb 7.2 oz) Adjusted ideal body weight: 58.9 kg (129 lb 13.9 oz)   BMI Readings from Last 4 Encounters:  03/21/18 29.08 kg/m  01/29/18 28.49 kg/m  01/17/18 28.49 kg/m  12/11/17 30.41 kg/m   Wt Readings from Last 4 Encounters:  03/21/18 159 lb (72.1 kg)  01/17/18 150 lb 12.8 oz (68.4 kg)  12/11/17 160 lb 15 oz (73 kg)  08/03/17 167 lb 6.4 oz (75.9 kg)  Psych/Mental status: Alert, oriented x 3 (person, place, & time)       Eyes: PERLA Respiratory: No evidence of acute respiratory distress  Cervical Spine Area Exam  Skin & Axial Inspection: No masses, redness, edema, swelling, or associated skin lesions Alignment: Symmetrical Functional ROM: Unrestricted ROM      Stability: No instability detected Muscle Tone/Strength: Functionally intact. No obvious neuro-muscular anomalies detected. Sensory (Neurological): Unimpaired Palpation: No palpable anomalies              Upper Extremity (UE) Exam    Side: Right upper extremity  Side: Left upper extremity  Skin & Extremity Inspection: Skin color, temperature, and hair growth are WNL. No peripheral edema or cyanosis. No masses, redness,  swelling, asymmetry, or associated skin lesions. No contractures.  Skin & Extremity Inspection: Skin color, temperature, and hair growth are WNL. No peripheral edema or cyanosis. No masses, redness, swelling, asymmetry, or associated skin lesions. No contractures.  Functional ROM: Unrestricted ROM          Functional ROM: Unrestricted ROM          Muscle Tone/Strength: Functionally intact. No obvious neuro-muscular anomalies detected.  Muscle Tone/Strength: Functionally intact. No obvious neuro-muscular anomalies detected.  Sensory (Neurological): Unimpaired          Sensory (Neurological): Unimpaired          Palpation: No palpable anomalies              Palpation: No palpable anomalies              Provocative Test(s):  Phalen's test: deferred Tinel's test: deferred Apley's scratch test (touch opposite shoulder):  Action 1 (Across chest): deferred Action 2 (Overhead): deferred Action 3 (LB reach): deferred   Provocative Test(s):  Phalen's test: deferred Tinel's test: deferred Apley's scratch test (touch opposite shoulder):  Action 1 (Across chest): deferred Action 2 (Overhead): deferred Action 3 (LB reach): deferred    Thoracic Spine Area Exam  Skin & Axial Inspection: No masses, redness, or swelling Alignment: Symmetrical Functional ROM:  Unrestricted ROM Stability: No instability detected Muscle Tone/Strength: Increased muscle tone over affected area Sensory (Neurological): Movement associated pain Muscle strength & Tone: No palpable anomalies  Lumbar Spine Area Exam  Skin & Axial Inspection: No masses, redness, or swelling Alignment: Symmetrical Functional ROM: Unrestricted ROM       Stability: No instability detected Muscle Tone/Strength: Functionally intact. No obvious neuro-muscular anomalies detected. Sensory (Neurological): Unimpaired Palpation: No palpable anomalies       Provocative Tests: Hyperextension/rotation test: deferred today       Lumbar quadrant test  (Kemp's test): deferred today       Lateral bending test: deferred today       Patrick's Maneuver: deferred today                   FABER test: deferred today                   S-I anterior distraction/compression test: deferred today         S-I lateral compression test: deferred today         S-I Thigh-thrust test: deferred today         S-I Gaenslen's test: deferred today          Gait & Posture Assessment  Ambulation: Unassisted Gait: Relatively normal for age and body habitus Posture: WNL   Lower Extremity Exam    Side: Right lower extremity  Side: Left lower extremity  Stability: No instability observed          Stability: No instability observed          Skin & Extremity Inspection: Skin color, temperature, and hair growth are WNL. No peripheral edema or cyanosis. No masses, redness, swelling, asymmetry, or associated skin lesions. No contractures.  Skin & Extremity Inspection: Skin color, temperature, and hair growth are WNL. No peripheral edema or cyanosis. No masses, redness, swelling, asymmetry, or associated skin lesions. No contractures.  Functional ROM: Unrestricted ROM                  Functional ROM: Unrestricted ROM                  Muscle Tone/Strength: Functionally intact. No obvious neuro-muscular anomalies detected.  Muscle Tone/Strength: Functionally intact. No obvious neuro-muscular anomalies detected.  Sensory (Neurological): Unimpaired  Sensory (Neurological): Unimpaired  Palpation: No palpable anomalies  Palpation: No palpable anomalies   Assessment & Plan  Primary Diagnosis & Pertinent Problem List: The primary encounter diagnosis was Chronic pain syndrome. 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), Lumbar facet arthropathy (Bilateral), Lumbar facet syndrome (Bilateral), Osteoarthritis of facet joint of lumbar spine, Spondylosis without myelopathy or radiculopathy, lumbosacral region, Chronic sacroiliac joint pain, Other specified dorsopathies, sacral and sacrococcygeal region, Chronic lower extremity pain (Tertiary Area of Pain) (Bilateral) (R>L), Chronic hip pain (Fourth Area of Pain) (Bilateral) (R>L), Chronic fracture of pubic ramus, sequela (Right), History of femoral neck fracture, sequela (Left), Neurogenic pain, Neuropathic pain syndrome (non-herpetic), Pharmacologic therapy, Long term prescription benzodiazepine use, Long term prescription opiate use, Left-sided chest wall pain, and Rib pain on left side were also pertinent to this visit.  Visit Diagnosis: 1. Chronic pain syndrome   2. Cancer related  pain   3. Chronic chest wall pain (Primary Area of Pain) (Right)   4. 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 Chronic post-thoracotomy pain   6. Chronic rib pain (Primary Area of Pain) (Right)   7. Chronic low back pain (Secondary Area of Pain) (Bilateral)   8. DDD (degenerative disc disease), lumbar   9. 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 Lumbar facet arthropathy (Bilateral)   11. Lumbar facet syndrome (Bilateral)   12. Osteoarthritis of facet joint of lumbar spine   13. Spondylosis without myelopathy or radiculopathy, lumbosacral region   14. Chronic sacroiliac joint pain   15. Other specified dorsopathies, sacral and sacrococcygeal region   16. Chronic lower extremity pain (Tertiary Area of Pain) (Bilateral) (R>L)   17. Chronic hip pain (Fourth Area of Pain) (Bilateral) (R>L)   18. Chronic fracture of pubic ramus, sequela (Right)   19. History of femoral neck fracture, sequela (Left)   20. Neurogenic pain   21. Neuropathic pain syndrome (non-herpetic)    22. Pharmacologic therapy   23. Long term prescription benzodiazepine use   24. Long term prescription opiate use   25. Left-sided chest wall pain   26. Rib pain on left side    Problems updated and reviewed during this visit: Problem  Left-Sided Chest Wall Pain  Rib Pain On Left Side    Plan of Care  Pharmacotherapy (Medications Ordered): Meds ordered this encounter  Medications  . orphenadrine (NORFLEX) injection 60 mg  . ketorolac (TORADOL) injection 60 mg  . predniSONE (DELTASONE) 20 MG tablet    Sig: Take 3 tab(s) in the morning x 3 days, then 2 tab(s) x 3 days, followed by 1 tab x 3 days.    Dispense:  21 tablet    Refill:  0    Do not add to the "Automatic Refill" notification system.    Procedure Orders     INTERCOSTAL NERVE BLOCK Lab Orders  No laboratory test(s) ordered today   Imaging Orders  No imaging studies ordered today   Referral Orders  No referral(s) requested today    Pharmacological management options:  Opioid Analgesics: We'll take over management today. See above orders Membrane stabilizer: We have discussed the possibility of optimizing this mode of therapy, if tolerated Muscle relaxant: We have discussed the possibility of a trial NSAID: We have discussed the possibility of a trial Other analgesic(s): To be determined at a later time   Interventional management options: Planned, scheduled, and/or pending:    Diagnostic Left Intercostal NB #1 (T4 & T8) under fluoro and IV sedadtion.   Considering:   Diagnostic right-sided intercostal nerve blocks  Possible right-sided intercostal nerve RFA  Diagnostic right-sided thoracic transforaminal ESI vs. Selective nerve root blocks  Diagnostic bilateral lumbar facet block  Possible bilateral lumbar facet RFA  Diagnostic bilateral sacroiliac joint block  Possible bilateral sacroiliac joint RFA  Diagnostic right-sided L4-5 interlaminar ESI  Diagnostic bilateral L4-5 vs L5-S1transforaminal ESI   Diagnostic (Midline) caudal ESI + diagnostic epidurogram  Diagnostic bilateral intra-articular hip joint injection  Diagnostic bilateral femoral nerve + obturator nerve block  Possible bilateral femoral nerve + obturator nerve RFA  Possible Racz procedure  Possible spinal cord stimulator trial    PRN Procedures:   None at this time   Provider-requested follow-up: Return for Procedure (w/ sedation): (L) ICNB #1 .  Future Appointments  Date Time Provider [place]  03/22/2018  1:00 PM Milinda Pointer, MD [facility]  None  05/29/2018  9:30 AM [name], MD [facility]  07/30/2018 12:00 PM [facility] None  08/01/2018 11:00 AM Curt Bears, MD [facility] None    Primary Care Physician: Wenda Low, MD Location: [facility] Note by: Gaspar Cola, MD Date: 03/21/2018; Time: 7:44 PM

## 2018-03-21 ENCOUNTER — Other Ambulatory Visit: Payer: Self-pay

## 2018-03-21 ENCOUNTER — Ambulatory Visit: Payer: Medicare Other | Attending: Pain Medicine | Admitting: Pain Medicine

## 2018-03-21 ENCOUNTER — Encounter: Payer: Self-pay | Admitting: Pain Medicine

## 2018-03-21 VITALS — BP 132/87 | HR 87 | Temp 98.7°F | Ht 62.0 in | Wt 159.0 lb

## 2018-03-21 DIAGNOSIS — Z79891 Long term (current) use of opiate analgesic: Secondary | ICD-10-CM

## 2018-03-21 DIAGNOSIS — I252 Old myocardial infarction: Secondary | ICD-10-CM | POA: Insufficient documentation

## 2018-03-21 DIAGNOSIS — M792 Neuralgia and neuritis, unspecified: Secondary | ICD-10-CM

## 2018-03-21 DIAGNOSIS — M5388 Other specified dorsopathies, sacral and sacrococcygeal region: Secondary | ICD-10-CM

## 2018-03-21 DIAGNOSIS — I130 Hypertensive heart and chronic kidney disease with heart failure and stage 1 through stage 4 chronic kidney disease, or unspecified chronic kidney disease: Secondary | ICD-10-CM | POA: Diagnosis not present

## 2018-03-21 DIAGNOSIS — J449 Chronic obstructive pulmonary disease, unspecified: Secondary | ICD-10-CM | POA: Insufficient documentation

## 2018-03-21 DIAGNOSIS — M47817 Spondylosis without myelopathy or radiculopathy, lumbosacral region: Secondary | ICD-10-CM

## 2018-03-21 DIAGNOSIS — N183 Chronic kidney disease, stage 3 (moderate): Secondary | ICD-10-CM | POA: Diagnosis not present

## 2018-03-21 DIAGNOSIS — G894 Chronic pain syndrome: Secondary | ICD-10-CM

## 2018-03-21 DIAGNOSIS — G8912 Acute post-thoracotomy pain: Secondary | ICD-10-CM

## 2018-03-21 DIAGNOSIS — M84454S Pathological fracture, pelvis, sequela: Secondary | ICD-10-CM | POA: Insufficient documentation

## 2018-03-21 DIAGNOSIS — M533 Sacrococcygeal disorders, not elsewhere classified: Secondary | ICD-10-CM | POA: Diagnosis not present

## 2018-03-21 DIAGNOSIS — I5021 Acute systolic (congestive) heart failure: Secondary | ICD-10-CM | POA: Diagnosis not present

## 2018-03-21 DIAGNOSIS — M5136 Other intervertebral disc degeneration, lumbar region: Secondary | ICD-10-CM | POA: Diagnosis not present

## 2018-03-21 DIAGNOSIS — J9621 Acute and chronic respiratory failure with hypoxia: Secondary | ICD-10-CM | POA: Diagnosis not present

## 2018-03-21 DIAGNOSIS — M961 Postlaminectomy syndrome, not elsewhere classified: Secondary | ICD-10-CM

## 2018-03-21 DIAGNOSIS — R079 Chest pain, unspecified: Secondary | ICD-10-CM | POA: Insufficient documentation

## 2018-03-21 DIAGNOSIS — G8922 Chronic post-thoracotomy pain: Secondary | ICD-10-CM

## 2018-03-21 DIAGNOSIS — M79605 Pain in left leg: Secondary | ICD-10-CM | POA: Diagnosis not present

## 2018-03-21 DIAGNOSIS — R0781 Pleurodynia: Secondary | ICD-10-CM | POA: Diagnosis not present

## 2018-03-21 DIAGNOSIS — E7800 Pure hypercholesterolemia, unspecified: Secondary | ICD-10-CM | POA: Insufficient documentation

## 2018-03-21 DIAGNOSIS — Z981 Arthrodesis status: Secondary | ICD-10-CM | POA: Diagnosis not present

## 2018-03-21 DIAGNOSIS — G893 Neoplasm related pain (acute) (chronic): Secondary | ICD-10-CM | POA: Diagnosis not present

## 2018-03-21 DIAGNOSIS — G8929 Other chronic pain: Secondary | ICD-10-CM

## 2018-03-21 DIAGNOSIS — M47816 Spondylosis without myelopathy or radiculopathy, lumbar region: Secondary | ICD-10-CM

## 2018-03-21 DIAGNOSIS — R0789 Other chest pain: Secondary | ICD-10-CM | POA: Diagnosis not present

## 2018-03-21 DIAGNOSIS — M79604 Pain in right leg: Secondary | ICD-10-CM | POA: Insufficient documentation

## 2018-03-21 DIAGNOSIS — M545 Low back pain, unspecified: Secondary | ICD-10-CM

## 2018-03-21 DIAGNOSIS — M25551 Pain in right hip: Secondary | ICD-10-CM

## 2018-03-21 DIAGNOSIS — S72002S Fracture of unspecified part of neck of left femur, sequela: Secondary | ICD-10-CM

## 2018-03-21 DIAGNOSIS — F411 Generalized anxiety disorder: Secondary | ICD-10-CM | POA: Insufficient documentation

## 2018-03-21 DIAGNOSIS — M51369 Other intervertebral disc degeneration, lumbar region without mention of lumbar back pain or lower extremity pain: Secondary | ICD-10-CM

## 2018-03-21 DIAGNOSIS — Z79899 Other long term (current) drug therapy: Secondary | ICD-10-CM

## 2018-03-21 DIAGNOSIS — M25552 Pain in left hip: Secondary | ICD-10-CM

## 2018-03-21 DIAGNOSIS — Z86718 Personal history of other venous thrombosis and embolism: Secondary | ICD-10-CM | POA: Insufficient documentation

## 2018-03-21 DIAGNOSIS — S32591S Other specified fracture of right pubis, sequela: Secondary | ICD-10-CM

## 2018-03-21 DIAGNOSIS — K219 Gastro-esophageal reflux disease without esophagitis: Secondary | ICD-10-CM | POA: Insufficient documentation

## 2018-03-21 MED ORDER — ORPHENADRINE CITRATE 30 MG/ML IJ SOLN
INTRAMUSCULAR | Status: AC
  Filled 2018-03-21: qty 2

## 2018-03-21 MED ORDER — KETOROLAC TROMETHAMINE 60 MG/2ML IM SOLN
INTRAMUSCULAR | Status: AC
  Filled 2018-03-21: qty 2

## 2018-03-21 MED ORDER — PREDNISONE 20 MG PO TABS: ORAL_TABLET | ORAL | 0 refills | Status: AC

## 2018-03-21 MED ORDER — ORPHENADRINE CITRATE 30 MG/ML IJ SOLN
60.0000 mg | Freq: Once | INTRAMUSCULAR | Status: AC
  Administered 2018-03-21: 60 mg via INTRAMUSCULAR

## 2018-03-21 MED ORDER — KETOROLAC TROMETHAMINE 60 MG/2ML IM SOLN
60.0000 mg | Freq: Once | INTRAMUSCULAR | Status: AC
  Administered 2018-03-21: 60 mg via INTRAMUSCULAR

## 2018-03-21 NOTE — Patient Instructions (Signed)
____________________________________________________________________________________________  Preparing for Procedure with Sedation  Instructions: . Oral Intake: Do not eat or drink anything for at least 8 hours prior to your procedure. . Transportation: Public transportation is not allowed. Bring an adult driver. The driver must be physically present in our waiting room before any procedure can be started. Marland Kitchen Physical Assistance: Bring an adult physically capable of assisting you, in the event you need help. This adult should keep you company at home for at least 6 hours after the procedure. . Blood Pressure Medicine: Take your blood pressure medicine with a sip of water the morning of the procedure. . Blood thinners: Notify our staff if you are taking any blood thinners. Depending on which one you take, there will be specific instructions on how and when to stop it. . Diabetics on insulin: Notify the staff so that you can be scheduled 1st case in the morning. If your diabetes requires high dose insulin, take only  of your normal insulin dose the morning of the procedure and notify the staff that you have done so. . Preventing infections: Shower with an antibacterial soap the morning of your procedure. . Build-up your immune system: Take 1000 mg of Vitamin C with every meal (3 times a day) the day prior to your procedure. Marland Kitchen Antibiotics: Inform the staff if you have a condition or reason that requires you to take antibiotics before dental procedures. . Pregnancy: If you are pregnant, call and cancel the procedure. . Sickness: If you have a cold, fever, or any active infections, call and cancel the procedure. . Arrival: You must be in the facility at least 30 minutes prior to your scheduled procedure. . Children: Do not bring children with you. . Dress appropriately: Bring dark clothing that you would not mind if they get stained. . Valuables: Do not bring any jewelry or valuables.  Procedure  appointments are reserved for interventional treatments only. Marland Kitchen No Prescription Refills. . No medication changes will be discussed during procedure appointments. . No disability issues will be discussed.  Reasons to call and reschedule or cancel your procedure: (Following these recommendations will minimize the risk of a serious complication.) . Surgeries: Avoid having procedures within 2 weeks of any surgery. (Avoid for 2 weeks before or after any surgery). . Flu Shots: Avoid having procedures within 2 weeks of a flu shots or . (Avoid for 2 weeks before or after immunizations). . Barium: Avoid having a procedure within 7-10 days after having had a radiological study involving the use of radiological contrast. (Myelograms, Barium swallow or enema study). . Heart attacks: Avoid any elective procedures or surgeries for the initial 6 months after a "Myocardial Infarction" (Heart Attack). . Blood thinners: It is imperative that you stop these medications before procedures. Let us know if you if you take any blood thinner.  . Infection: Avoid procedures during or within two weeks of an infection (including chest colds or gastrointestinal problems). Symptoms associated with infections include: Localized redness, fever, chills, night sweats or profuse sweating, burning sensation when voiding, cough, congestion, stuffiness, runny nose, sore throat, diarrhea, nausea, vomiting, cold or Flu symptoms, recent or current infections. It is specially important if the infection is over the area that we intend to treat. Marland Kitchen Heart and lung problems: Symptoms that may suggest an active cardiopulmonary problem include: cough, chest pain, breathing difficulties or shortness of breath, dizziness, ankle swelling, uncontrolled high or unusually low blood pressure, and/or palpitations. If you are experiencing any of these symptoms, cancel  your procedure and contact your primary care physician for an evaluation.  Remember:   Regular Business hours are:  Monday to Thursday 8:00 AM to 4:00 PM  Provider's Schedule: Milinda Pointer, MD:  Procedure days: Tuesday and Thursday 7:30 AM to 4:00 PM  Gillis Santa, MD:  Procedure days: Monday and Wednesday 7:30 AM to 4:00 PM ____________________________________________________________________________________________

## 2018-03-21 NOTE — Addendum Note (Signed)
Addended by: Milinda Pointer A on: 03/21/2018 11:58 AM   Modules accepted: Orders

## 2018-03-22 ENCOUNTER — Encounter: Payer: Self-pay | Admitting: Pain Medicine

## 2018-03-22 ENCOUNTER — Ambulatory Visit
Admission: RE | Admit: 2018-03-22 | Discharge: 2018-03-22 | Disposition: A | Discharge status: Home / Self Care | Payer: Medicare Other | Source: Ambulatory Visit | Attending: Pain Medicine | Admitting: Pain Medicine

## 2018-03-22 ENCOUNTER — Ambulatory Visit (HOSPITAL_BASED_OUTPATIENT_CLINIC_OR_DEPARTMENT_OTHER): Payer: Medicare Other | Admitting: Pain Medicine

## 2018-03-22 ENCOUNTER — Other Ambulatory Visit: Payer: Self-pay

## 2018-03-22 VITALS — BP 158/77 | HR 63 | Temp 98.5°F | Resp 14 | Ht 62.0 in | Wt 159.0 lb

## 2018-03-22 DIAGNOSIS — R0789 Other chest pain: Secondary | ICD-10-CM | POA: Diagnosis not present

## 2018-03-22 DIAGNOSIS — S243XXS Injury of peripheral nerves of thorax, sequela: Secondary | ICD-10-CM | POA: Diagnosis present

## 2018-03-22 DIAGNOSIS — M792 Neuralgia and neuritis, unspecified: Secondary | ICD-10-CM

## 2018-03-22 DIAGNOSIS — R0781 Pleurodynia: Secondary | ICD-10-CM | POA: Insufficient documentation

## 2018-03-22 DIAGNOSIS — R079 Chest pain, unspecified: Secondary | ICD-10-CM | POA: Diagnosis present

## 2018-03-22 MED ORDER — DEXAMETHASONE SODIUM PHOSPHATE 10 MG/ML IJ SOLN
10.0000 mg | Freq: Once | INTRAMUSCULAR | Status: AC
  Administered 2018-03-22: 10 mg
  Filled 2018-03-22: qty 1

## 2018-03-22 MED ORDER — LACTATED RINGERS IV SOLN
1000.0000 mL | Freq: Once | INTRAVENOUS | Status: AC
  Administered 2018-03-22: 1000 mL via INTRAVENOUS

## 2018-03-22 MED ORDER — MIDAZOLAM HCL 5 MG/5ML IJ SOLN
1.0000 mg | INTRAMUSCULAR | Status: DC | PRN
  Administered 2018-03-22: 4 mg via INTRAVENOUS
  Filled 2018-03-22: qty 5

## 2018-03-22 MED ORDER — FENTANYL CITRATE (PF) 100 MCG/2ML IJ SOLN
25.0000 ug | INTRAMUSCULAR | Status: DC | PRN
  Administered 2018-03-22: 50 ug via INTRAVENOUS
  Filled 2018-03-22: qty 2

## 2018-03-22 MED ORDER — LIDOCAINE HCL 2 % IJ SOLN
20.0000 mL | Freq: Once | INTRAMUSCULAR | Status: AC
  Administered 2018-03-22: 400 mg
  Filled 2018-03-22: qty 40

## 2018-03-22 MED ORDER — ROPIVACAINE HCL 2 MG/ML IJ SOLN
9.0000 mL | Freq: Once | INTRAMUSCULAR | Status: AC
  Administered 2018-03-22: 9 mL via PERINEURAL
  Filled 2018-03-22: qty 10

## 2018-03-22 NOTE — Patient Instructions (Signed)

## 2018-03-22 NOTE — Progress Notes (Signed)
Patient's Name: [name]  MRN: [id number]  Referring Provider: Wenda Low, MD  DOB: Aug 11, 1940  PCP: Wenda Low, MD  DOS: 03/22/2018  Note by: Gaspar Cola, MD  Service setting: Ambulatory outpatient  Specialty: Interventional Pain Management  Patient type: Established  Location: [facility] (AMB) Pain Management Facility  Visit type: Interventional Procedure   Primary Reason for Visit: Interventional Pain Management Treatment. CC: Chest Pain (left)  Procedure:          Anesthesia, Analgesia, Anxiolysis:  Type: Diagnostic Posterior Intercostal  Nerve Block Region: Posterolateral Thoracic Area Level: T7, T8, T9, T10 Ribs Laterality: Left-Sided Position: Prone  Type: Moderate (Conscious) Sedation combined with Local Anesthesia Indication(s): Analgesia and Anxiety Route: Intravenous (IV) IV Access: Secured Sedation: Meaningful verbal contact was maintained at all times during the procedure  Local Anesthetic: Lidocaine 1-2%   Indications: 1. Injury of peripheral nerves of thorax, sequela   2. Rib pain (Left)   3. Left-sided chest wall pain   4. Neuropathic pain syndrome (non-herpetic)   5. 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 (year old), female patient, seen today for interventional treatment. She  has a past surgical history that includes Cholecystectomy; Appendectomy; Total abdominal hysterectomy; Back surgery; Knee surgery; Hip surgery (01/2010); Mass excision (08/25/2011); Ankle surgery (Right); Fracture surgery; Inner ear surgery (Bilateral, 1970); Eye surgery; Video assisted thoracoscopy (vats)/wedge resection (Right, 05/09/2016); Lobectomy (Right, 05/09/2016); and LEFT HEART CATH AND CORONARY ANGIOGRAPHY (N/A, 12/11/2017). [name] has a current medication list which includes the following prescription(s): albuterol, albuterol, aluminum-magnesium  hydroxide-simethicone, amlodipine, atorvastatin, diazepam, fluticasone-salmeterol, gabapentin, lisinopril, meloxicam, menthol (topical analgesic), metoclopramide, metoprolol tartrate, omega-3 acid ethyl esters, pantoprazole, prednisone, raloxifene, sertraline, tiotropium, tramadol, glucosamine hcl, magnesium, multiple vitamins-minerals, and prednisone, and the following Facility-Administered Medications: fentanyl and midazolam. Her primarily concern today is the Chest Pain (left)  Initial Vital Signs:  Pulse/HCG Rate: 70ECG Heart Rate: 76 Temp: 98.6 F (37 C) Resp: 14 BP: (!) 145/68 SpO2: 95 %  BMI: Estimated body mass index is 29.08 kg/m as calculated from the following:   Height as of this encounter: 5\' 2"  (1.575 m).   Weight as of this encounter: 159 lb (72.1 kg).  Risk Assessment: Allergies: Reviewed. She is allergic to boniva [ibandronate sodium] and lyrica [pregabalin].  Allergy Precautions: None required Coagulopathies: Reviewed. None identified.  Blood-thinner therapy: None at this time Active Infection(s): Reviewed. None identified. [name] is afebrile  Site Confirmation: [name] was asked to confirm the procedure and laterality before marking the site Procedure checklist: Completed Consent: Before the procedure and under the influence of no sedative(s), amnesic(s), or anxiolytics, the patient was informed of the treatment options, risks and possible complications. To fulfill our ethical and legal obligations, as recommended by the American Medical Association's Code of Ethics, I have informed the patient of my clinical impression; the nature and purpose of the treatment or procedure; the risks, benefits, and possible complications of the intervention; the alternatives, including doing nothing; the risk(s) and benefit(s) of the alternative treatment(s) or procedure(s); and the risk(s) and benefit(s) of doing nothing. The patient was provided information about the general  risks and possible complications associated with the procedure. These may include, but are not limited to: failure to achieve desired goals, infection, bleeding, organ or nerve damage, allergic reactions, paralysis, and death. In addition, the patient was informed of those risks and complications  associated to the procedure, such as failure to decrease pain; infection; bleeding; organ or nerve damage with subsequent damage to sensory, motor, and/or autonomic systems, resulting in permanent pain, numbness, and/or weakness of one or several areas of the body; allergic reactions; (i.e.: anaphylactic reaction); and/or death. Furthermore, the patient was informed of those risks and complications associated with the medications. These include, but are not limited to: allergic reactions (i.e.: anaphylactic or anaphylactoid reaction(s)); adrenal axis suppression; blood sugar elevation that in diabetics may result in ketoacidosis or comma; water retention that in patients with history of congestive heart failure may result in shortness of breath, pulmonary edema, and decompensation with resultant heart failure; weight gain; swelling or edema; medication-induced neural toxicity; particulate matter embolism and blood vessel occlusion with resultant organ, and/or nervous system infarction; and/or aseptic necrosis of one or more joints. Finally, the patient was informed that Medicine is not an exact science; therefore, there is also the possibility of unforeseen or unpredictable risks and/or possible complications that may result in a catastrophic outcome. The patient indicated having understood very clearly. We have given the patient no guarantees and we have made no promises. Enough time was given to the patient to ask questions, all of which were answered to the patient's satisfaction. [name] has indicated that she wanted to continue with the procedure. Attestation: I, the ordering provider, attest that I have  discussed with the patient the benefits, risks, side-effects, alternatives, likelihood of achieving goals, and potential problems during recovery for the procedure that I have provided informed consent. Date  Time: 03/22/2018 12:42 PM  Pre-Procedure Preparation:  Monitoring: As per clinic protocol. Respiration, ETCO2, SpO2, BP, heart rate and rhythm monitor placed and checked for adequate function Safety Precautions: Patient was assessed for positional comfort and pressure points before starting the procedure. Time-out: I initiated and conducted the "Time-out" before starting the procedure, as per protocol. The patient was asked to participate by confirming the accuracy of the "Time Out" information. Verification of the correct person, site, and procedure were performed and confirmed by me, the nursing staff, and the patient. "Time-out" conducted as per Joint Commission's Universal Protocol (UP.01.01.01). Time: 1408  Description of Procedure:          Target Area: The sub-costal neurovascular bundle area Approach: Sub-costal approach Area Prepped: Entire Mid-Axillary Thoracic Region Prepping solution: ChloraPrep (2% chlorhexidine gluconate and 70% isopropyl alcohol) Safety Precautions: Aspiration looking for blood return was conducted prior to all injections. At no point did we inject any substances, as a needle was being advanced. No attempts were made at seeking any paresthesias. Safe injection practices and needle disposal techniques used. Medications properly checked for expiration dates. SDV (single dose vial) medications used. Description of the Procedure: Protocol guidelines were followed. The patient was placed in position over the procedure table. The target area was identified and the area prepped in the usual manner. Skin & deeper tissues infiltrated with local anesthetic. After cleaning the skin with an antiseptic solution, 1-2 mL of dilute local anesthetic was infiltrated subcutaneously  at the planned injection site. The fingers of the palpating hand were used to straddle the insertion site at the inferior border of the rib and fix the skin to avoid unwanted skin movement. Appropriate amount of time allowed to pass for local anesthetics to take effect. The procedure needles were then advanced to the target area at an angle of approximately 20 cephalad to the skin. Contact with the rib was made. While maintaining the same angle  of insertion, the needle was walked off the inferior border of the rib as the skin was allowed to return to its initial position. Then the needle was advanced no more than 3 mm below the inferior margin of the rib. Proper needle placement was secured. Negative aspiration confirmed. Following negative aspiration for blood or air, 3-5 mL of local anesthetic was injected. The solution injected in intermittent fashion, asking for systemic symptoms every 0.5cc of injectate. The needle was then removed and the area cleansed, making sure to leave some of the prepping solution back to take advantage of its long term bactericidal properties. Vitals:   03/22/18 1419 03/22/18 1429 03/22/18 1439 03/22/18 1449  BP: (!) 121/94 (!) 141/90 (!) 151/88 (!) 158/77  Pulse:  65 65 63  Resp: 18 15 14 14   Temp:  99.1 F (37.3 C)  98.5 F (36.9 C)  TempSrc:  Temporal    SpO2: 97% 100% 99% 98%  Weight:      Height:        Start Time: 1408 hrs. End Time: 1418 hrs.   Materials:  Needle(s) Type: Regular needle Gauge: 25G Length: 1.5-in Medication(s): Please see orders for medications and dosing details.  Imaging Guidance (Non-Spinal):          Type of Imaging Technique: Fluoroscopy Guidance (Non-Spinal) Indication(s): Assistance in needle guidance and placement for procedures requiring needle placement in or near specific anatomical locations not easily accessible without such assistance. Exposure Time: Please see nurses notes. Contrast: Before injecting any contrast, we  confirmed that the patient did not have an allergy to iodine, shellfish, or radiological contrast. Once satisfactory needle placement was completed at the desired level, radiological contrast was injected. Contrast injected under live fluoroscopy. No contrast complications. See chart for type and volume of contrast used. Fluoroscopic Guidance: I was personally present during the use of fluoroscopy. "Tunnel Vision Technique" used to obtain the best possible view of the target area. Parallax error corrected before commencing the procedure. "Direction-depth-direction" technique used to introduce the needle under continuous pulsed fluoroscopy. Once target was reached, antero-posterior, oblique, and lateral fluoroscopic projection used confirm needle placement in all planes. Images permanently stored in EMR. Interpretation: I personally interpreted the imaging intraoperatively. Adequate needle placement confirmed in multiple planes. Appropriate spread of contrast into desired area was observed. No evidence of afferent or efferent intravascular uptake. Permanent images saved into the patient's record.  Antibiotic Prophylaxis:   Anti-infectives (From admission, onward)   None     Indication(s): None identified  Post-operative Assessment:  Post-procedure Vital Signs:  Pulse/HCG Rate: 6372 Temp: 98.5 F (36.9 C) Resp: 14 BP: (!) 158/77 SpO2: 98 %  EBL: None  Complications: No immediate post-treatment complications observed by team, or reported by patient.  Note: The patient tolerated the entire procedure well. A repeat set of vitals were taken after the procedure and the patient was kept under observation following institutional policy, for this type of procedure. Post-procedural neurological assessment was performed, showing return to baseline, prior to discharge. The patient was provided with post-procedure discharge instructions, including a section on how to identify potential problems. Should  any problems arise concerning this procedure, the patient was given instructions to immediately contact us, at any time, without hesitation. In any case, we plan to contact the patient by telephone for a follow-up status report regarding this interventional procedure.  Comments:  No additional relevant information.  Plan of Care    Imaging Orders     DG C-Arm 1-60 Min-No  Report  Procedure Orders     INTERCOSTAL NERVE BLOCK  Medications ordered for procedure: Meds ordered this encounter  Medications  . lidocaine (XYLOCAINE) 2 % (with pres) injection 400 mg  . midazolam (VERSED) 5 MG/5ML injection 1-2 mg    Make sure Flumazenil is available in the pyxis when using this medication. If oversedation occurs, administer 0.2 mg IV over 15 sec. If after 45 sec no response, administer 0.2 mg again over 1 min; may repeat at 1 min intervals; not to exceed 4 doses (1 mg)  . fentaNYL (SUBLIMAZE) injection 25-50 mcg    Make sure Narcan is available in the pyxis when using this medication. In the event of respiratory depression (RR< 8/min): Titrate NARCAN (naloxone) in increments of 0.1 to 0.2 mg IV at 2-3 minute intervals, until desired degree of reversal.  . lactated ringers infusion 1,000 mL  . ropivacaine (PF) 2 mg/mL (0.2%) (NAROPIN) injection 9 mL  . dexamethasone (DECADRON) injection 10 mg   Medications administered: We administered lidocaine, midazolam, fentaNYL, lactated ringers, ropivacaine (PF) 2 mg/mL (0.2%), and dexamethasone.  See the medical record for exact dosing, route, and time of administration.  New Prescriptions   No medications on file   Disposition: Discharge home  Discharge Date & Time: 03/22/2018; 1455 hrs.   Physician-requested Follow-up: Return for post-procedure eval (2 wks), w/ Dr. Dossie Arbour.  Future Appointments  Date Time Provider [place]  04/11/2018  1:15 PM Milinda Pointer, MD [facility] None  05/29/2018  9:30 AM [name], MD [facility]  07/30/2018 12:00 PM [facility] None  08/01/2018 11:00 AM Curt Bears, MD [facility] None   Primary Care Physician: Wenda Low, MD Location: [facility] Note by: Gaspar Cola, MD Date: 03/22/2018; Time: 3:11 PM  Disclaimer:  Medicine is not an Chief Strategy Officer. The only guarantee in medicine is that nothing is guaranteed. It is important to note that the decision to proceed with this intervention was based on the information collected from the patient. The Data and conclusions were drawn from the patient's questionnaire, the interview, and the physical examination. Because the information was provided in large part by the patient, it cannot be guaranteed that it has not been purposely or unconsciously manipulated. Every effort has been made to obtain as much relevant data as possible for this evaluation. It is important to note that the conclusions that lead to this procedure are derived in large part from the available data. Always take into account that the treatment will also be dependent on availability of resources and existing treatment guidelines, considered by other Pain Management Practitioners as being common knowledge and practice, at the time of the intervention. For Medico-Legal purposes, it is also important to point out that variation in procedural techniques and pharmacological choices are the acceptable norm. The indications, contraindications, technique, and results of the above procedure should only be interpreted and judged by a Board-Certified Interventional Pain Specialist with extensive familiarity and expertise in the same exact procedure and technique.

## 2018-03-23 ENCOUNTER — Telehealth: Payer: Self-pay

## 2018-03-23 NOTE — Telephone Encounter (Signed)
Post procedure phone call.  LM 

## 2018-03-26 ENCOUNTER — Emergency Department (HOSPITAL_COMMUNITY): Payer: Medicare Other

## 2018-03-26 ENCOUNTER — Encounter (HOSPITAL_COMMUNITY): Payer: Self-pay

## 2018-03-26 ENCOUNTER — Emergency Department (HOSPITAL_COMMUNITY)
Admission: EM | Admit: 2018-03-26 | Discharge: 2018-03-26 | Disposition: A | Discharge status: Home / Self Care | Payer: Medicare Other | Attending: Emergency Medicine | Admitting: Emergency Medicine

## 2018-03-26 DIAGNOSIS — Z87891 Personal history of nicotine dependence: Secondary | ICD-10-CM | POA: Diagnosis not present

## 2018-03-26 DIAGNOSIS — R0602 Shortness of breath: Secondary | ICD-10-CM | POA: Diagnosis present

## 2018-03-26 DIAGNOSIS — J441 Chronic obstructive pulmonary disease with (acute) exacerbation: Secondary | ICD-10-CM | POA: Insufficient documentation

## 2018-03-26 DIAGNOSIS — I10 Essential (primary) hypertension: Secondary | ICD-10-CM | POA: Insufficient documentation

## 2018-03-26 DIAGNOSIS — Z79899 Other long term (current) drug therapy: Secondary | ICD-10-CM | POA: Diagnosis not present

## 2018-03-26 LAB — BASIC METABOLIC PANEL
Anion gap: 8 (ref 5–15)
BUN: 20 mg/dL (ref 8–23)
CO2: 28 mmol/L (ref 22–32)
Calcium: 8.7 mg/dL — ABNORMAL LOW (ref 8.9–10.3)
Chloride: 106 mmol/L (ref 98–111)
Creatinine, Ser: 1.38 mg/dL — ABNORMAL HIGH (ref 0.44–1.00)
GFR calc Af Amer: 42 mL/min — ABNORMAL LOW (ref >60)
GFR calc non Af Amer: 37 mL/min — ABNORMAL LOW (ref >60)
Glucose, Bld: 191 mg/dL — ABNORMAL HIGH (ref 70–99)
Potassium: 3.2 mmol/L — ABNORMAL LOW (ref 3.5–5.1)
Sodium: 142 mmol/L (ref 135–145)

## 2018-03-26 LAB — CBC WITH DIFFERENTIAL/PLATELET
Abs Immature Granulocytes: 0.1 10*3/uL (ref 0.0–0.1)
Basophils Absolute: 0 10*3/uL (ref 0.0–0.1)
Basophils Relative: 0 %
Eosinophils Absolute: 0 10*3/uL (ref 0.0–0.7)
Eosinophils Relative: 0 %
HCT: 39.1 % (ref 36.0–46.0)
Hemoglobin: 12.4 g/dL (ref 12.0–15.0)
Immature Granulocytes: 1 %
Lymphocytes Relative: 3 %
Lymphs Abs: 0.4 10*3/uL — ABNORMAL LOW (ref 0.7–4.0)
MCH: 29.5 pg (ref 26.0–34.0)
MCHC: 31.7 g/dL (ref 30.0–36.0)
MCV: 92.9 fL (ref 78.0–100.0)
Monocytes Absolute: 0.1 10*3/uL (ref 0.1–1.0)
Monocytes Relative: 1 %
Neutro Abs: 12.4 10*3/uL — ABNORMAL HIGH (ref 1.7–7.7)
Neutrophils Relative %: 95 %
Platelets: 226 10*3/uL (ref 150–400)
RBC: 4.21 MIL/uL (ref 3.87–5.11)
RDW: 13.9 % (ref 11.5–15.5)
WBC: 13.1 10*3/uL — ABNORMAL HIGH (ref 4.0–10.5)

## 2018-03-26 IMAGING — DX DG CHEST 2V
2 series · 2 of 2 positions shown · non-contrast
Comparison: Prior chest x-ray [DATE]; prior CT scan of the
chest [DATE]

CLINICAL DATA: 76-year-old female with dyspnea, cough and history
of COPD and lung cancer

EXAM:
CHEST - 2 VIEW

[x chest ap]
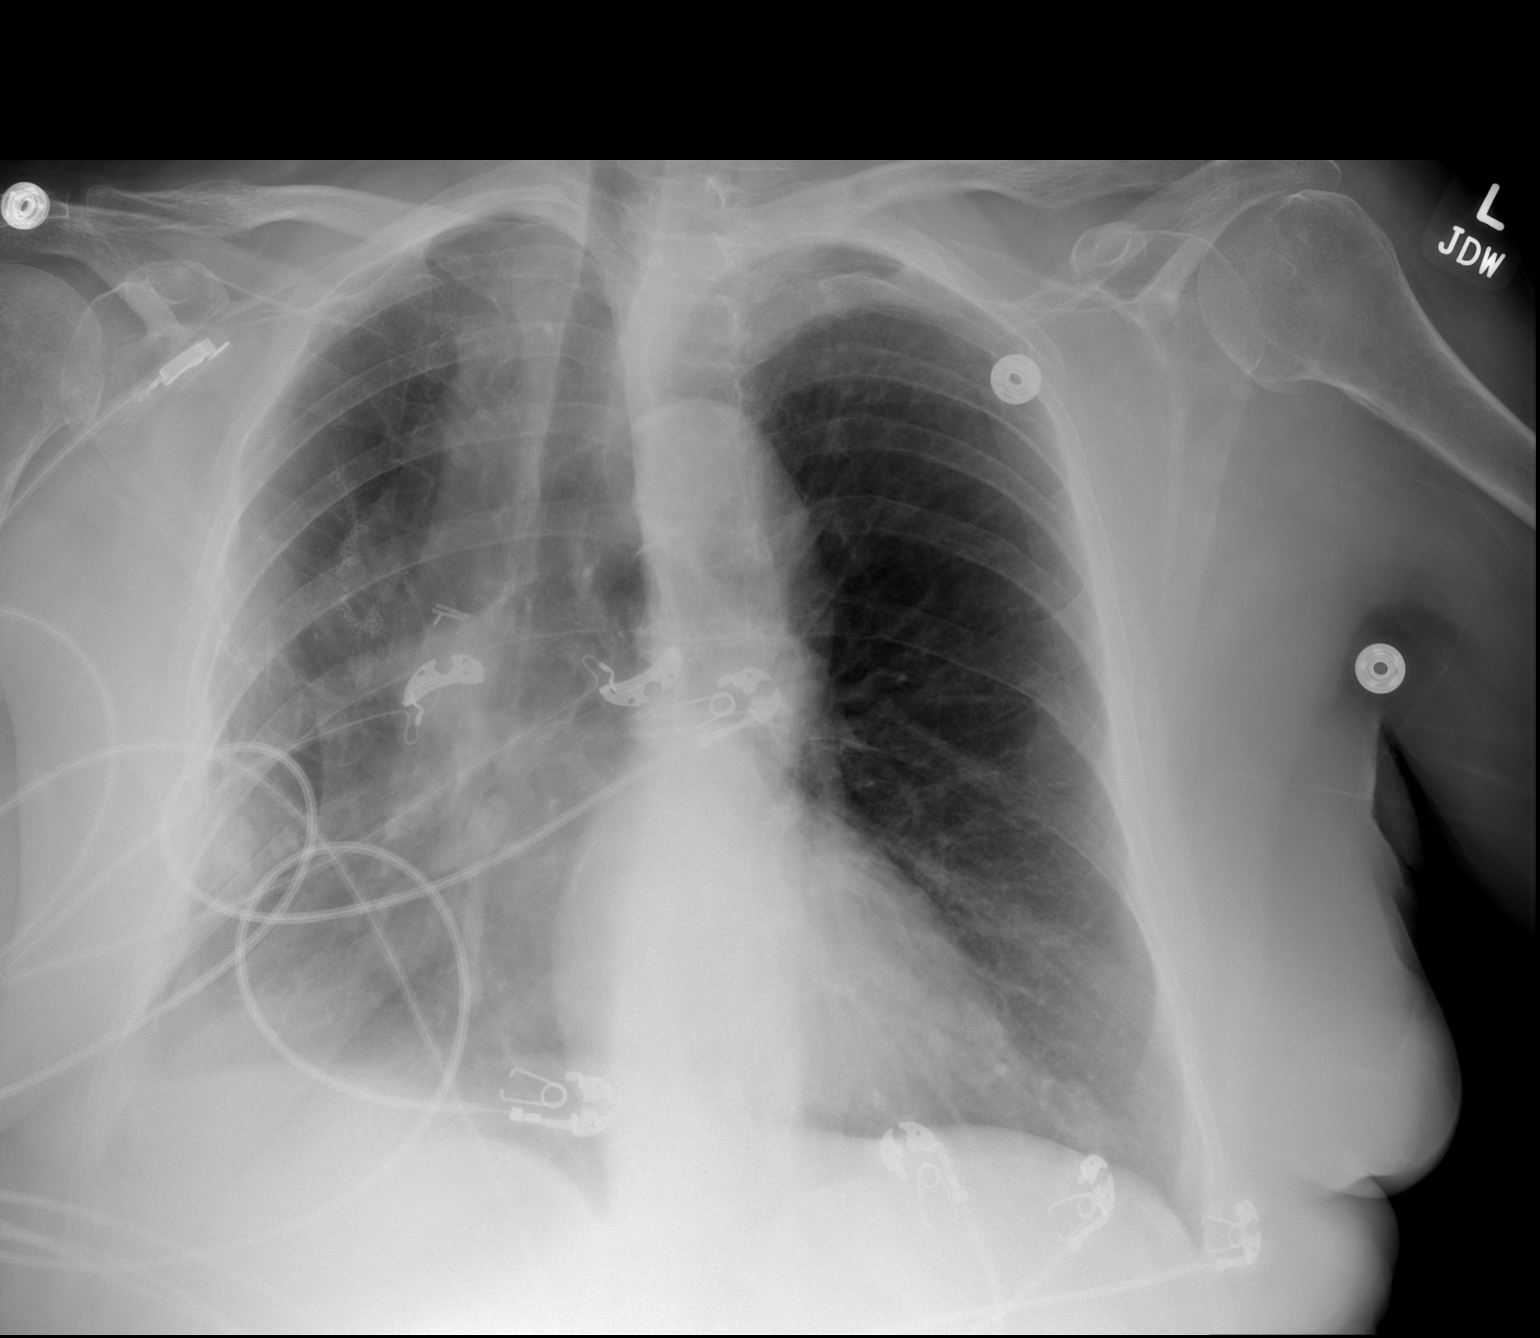

[w chest lat]
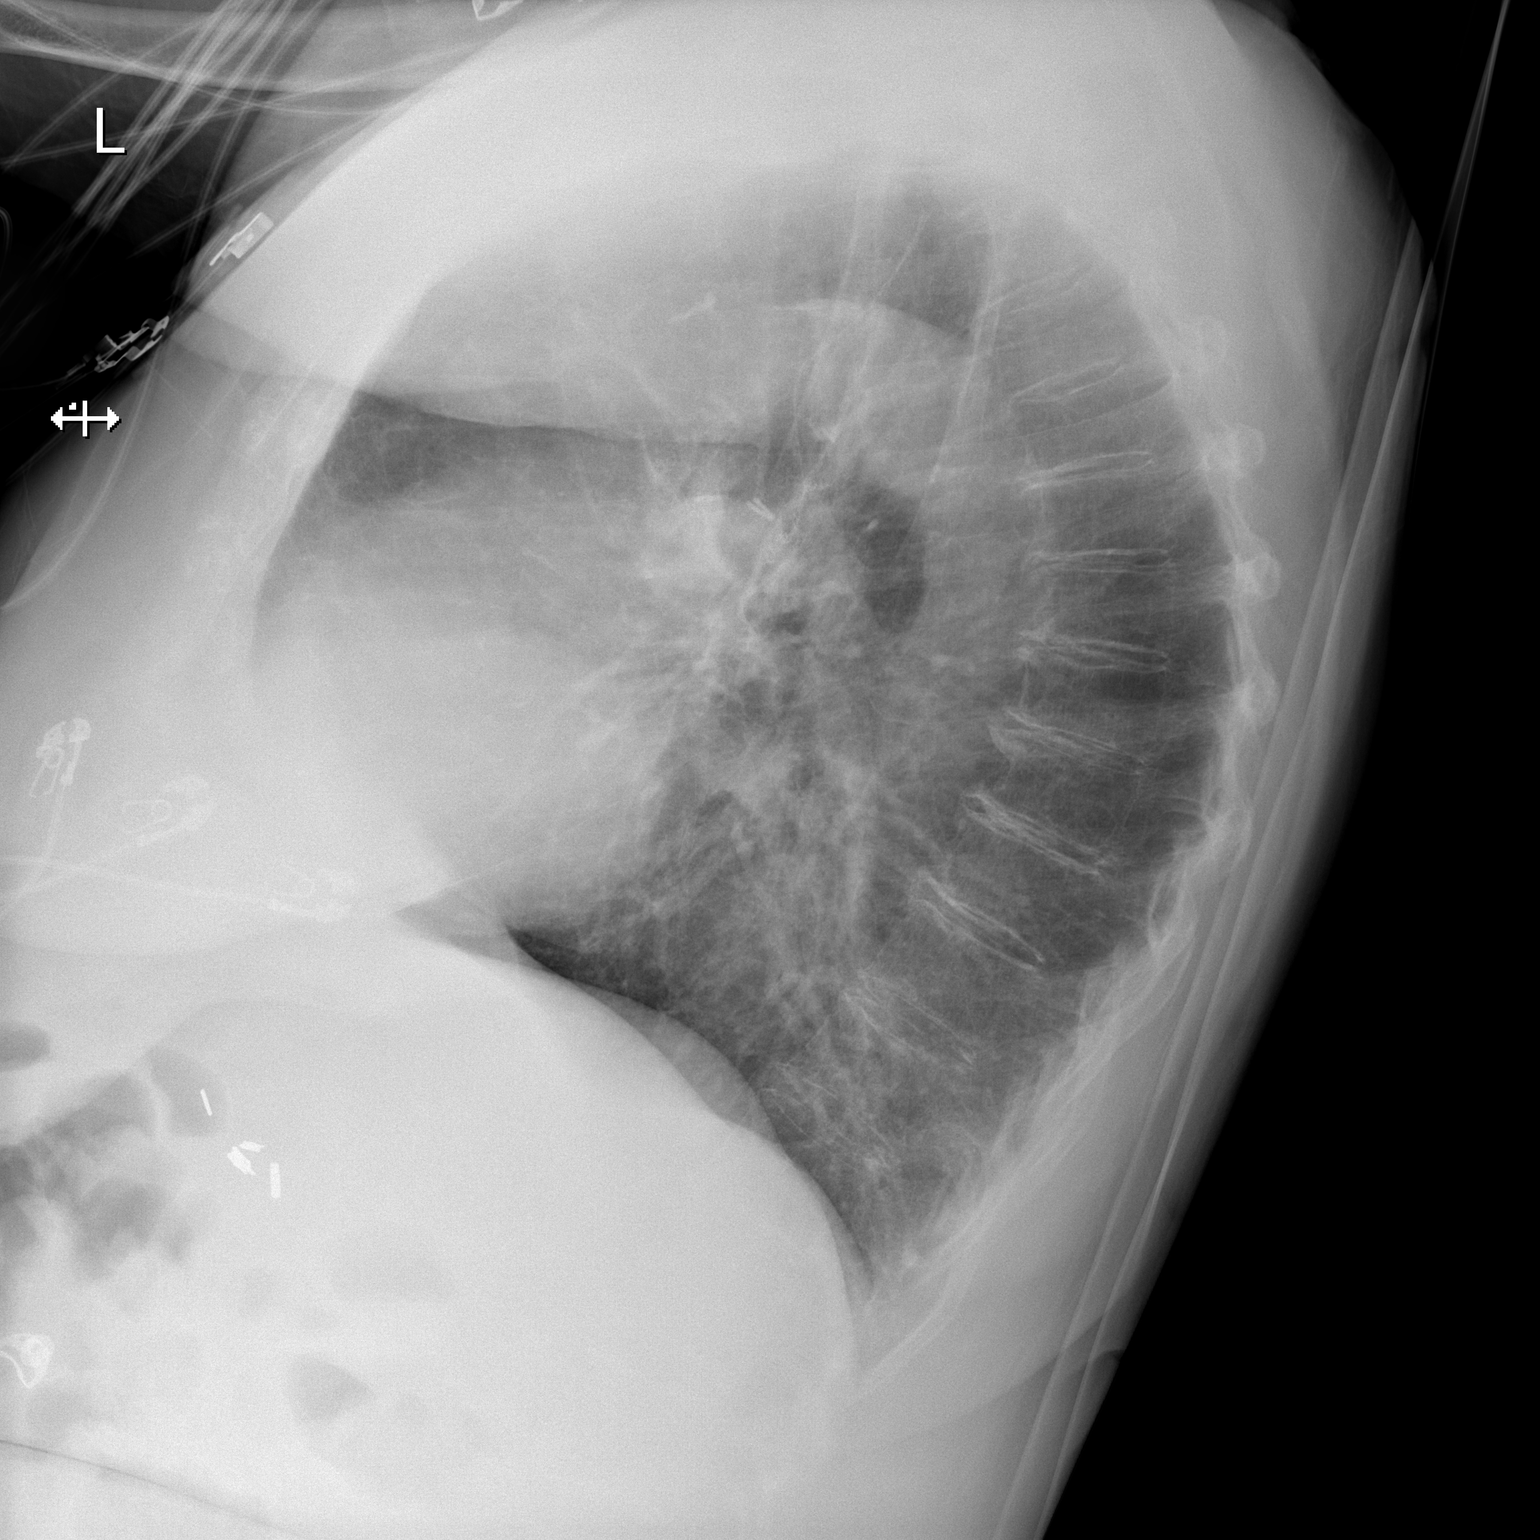

[2 of 2 positions shown; findings below may reference images not displayed]

FINDINGS: Prior surgical changes of right upper lobectomy. Increased volume
loss on the right with progressive left-to-right shift of the
cardiac and mediastinal structures. Imaging findings are confound by
rightward rotated position of the patient. Atherosclerotic
calcifications are again present within the transverse aorta. The
left lung is clear. No definite pleural effusion or focal airspace
consolidation on the lateral view. Suspect right basilar atelectasis
and chronic bronchitic changes. No acute osseous abnormality.
IMPRESSION: No definite acute cardiopulmonary process given rightward rotated
position of the patient. In particular, the lateral chest x-ray
demonstrates no acute disease.

Suspect at least slightly increased right basilar atelectasis.

Surgical changes of prior right upper lobectomy.

Aortic Atherosclerosis ([VU]-170.0)

## 2018-03-26 MED ORDER — PREDNISONE 20 MG PO TABS: 40.0000 mg | ORAL_TABLET | Freq: Every day | ORAL | 0 refills | Status: DC

## 2018-03-26 MED ORDER — ALBUTEROL (5 MG/ML) CONTINUOUS INHALATION SOLN
15.0000 mg/h | INHALATION_SOLUTION | RESPIRATORY_TRACT | Status: DC
  Administered 2018-03-26: 15 mg/h via RESPIRATORY_TRACT
  Filled 2018-03-26: qty 20

## 2018-03-26 MED ORDER — HYDROCODONE-ACETAMINOPHEN 5-325 MG PO TABS: 2.0000 | ORAL_TABLET | Freq: Four times a day (QID) | ORAL | 0 refills | Status: DC | PRN

## 2018-03-26 MED ORDER — MORPHINE SULFATE (PF) 4 MG/ML IV SOLN: 4.0000 mg | Freq: Once | INTRAVENOUS | Status: DC

## 2018-03-26 MED ORDER — LORAZEPAM 2 MG/ML IJ SOLN
1.0000 mg | Freq: Once | INTRAMUSCULAR | Status: AC
  Administered 2018-03-26: 1 mg via INTRAVENOUS
  Filled 2018-03-26: qty 1

## 2018-03-26 MED ORDER — POTASSIUM CHLORIDE CRYS ER 20 MEQ PO TBCR
20.0000 meq | EXTENDED_RELEASE_TABLET | Freq: Once | ORAL | Status: AC
  Administered 2018-03-26: 20 meq via ORAL
  Filled 2018-03-26: qty 1

## 2018-03-26 NOTE — ED Triage Notes (Signed)
Recent broken ribs on left side

## 2018-03-26 NOTE — ED Triage Notes (Signed)
[facility] reports hx of lung cx and sob, which worsened last night. bilat rhonci wheezing  Given 10 albuterol, .5 atrivent, 125 solumedrol 97% RA, 140/84 hr 100 98.1T

## 2018-03-26 NOTE — ED Notes (Signed)
Placed on 2lpm oxygen

## 2018-03-27 ENCOUNTER — Ambulatory Visit: Payer: Medicare Other | Admitting: Pain Medicine

## 2018-03-27 ENCOUNTER — Telehealth: Payer: Self-pay | Admitting: Pain Medicine

## 2018-03-27 NOTE — Telephone Encounter (Signed)
Patient [name] stating she is doing well and not so much pain on Fri at 2:28 pm.  Niece Cath Bess Harvest on Mon 03-26-18 at 8:40 stating her aunt was having severe pain in her ribs. Please call patient

## 2018-03-27 NOTE — Telephone Encounter (Signed)
Attempted to call patient at home.  Left voicemail.  Attempted to call on cell phone.  Left message.

## 2018-03-28 NOTE — Telephone Encounter (Signed)
States pain was worse for a while after the procedure, but is getting better now. Advised that is has been 6 days since the procedure, it usually takes 4-10 days to achieve relief from steroids.

## 2018-04-05 NOTE — ED Provider Notes (Signed)
[facility] EMERGENCY DEPARTMENT Provider Note   CSN: [id number] Arrival date & time: 03/26/18  1347     History   Chief Complaint Chief Complaint  Patient presents with  . Shortness of Breath    HPI [name] is a 77 y.o. female.  HPI   77 year old female with dyspnea.  Worsening last night.  Persisting throughout the morning the day today.  Wheezing on EMS arrival.  She received 10 mg of albuterol, 0.5 mg Atrovent and 125 mg of Solu-Medrol prior to coming to the emergency room.  Currently feeling better although still symptomatic.  She reports that she recently broke her ribs on her left side.  She has had persistent pain since then.  She denies any acute change in this.  Patient nonproductive cough.  No fevers or chills.  No unusual leg pain or swelling.  Past Medical History:  Diagnosis Date  . Adenocarcinoma of right lung, stage 1 ([place]) 07/28/2016  . Anxiety   . Cancer ([place])    uterine  . COPD (chronic obstructive pulmonary disease) ([place])   . Cyst    left side of neck  . Depression   . GERD (gastroesophageal reflux disease)   . Heart murmur    per patient  . Hyperlipidemia   . Hypertension   . Low back pain   . Lumbar back pain   . Osteoarthritis   . Osteoporosis     Patient Active Problem List   Diagnosis Date Noted  . Injury of peripheral nerves of thorax, sequela 03/22/2018  . Left-sided chest wall pain 03/21/2018  . Rib pain (Left) 03/21/2018  . Chronic rib pain (Primary Area of Pain) (Right) 03/20/2018  . DDD (degenerative disc disease), lumbar 03/20/2018  . Chronic fracture of pubic ramus, sequela (Right) 03/20/2018  . History of femoral neck fracture, sequela (Left) 03/20/2018    Class: History of  . Lumbar facet arthropathy (Bilateral) 03/20/2018  . Lumbar facet syndrome (Bilateral) 03/20/2018  . Osteoarthritis of facet joint of lumbar spine 03/20/2018  . Spondylosis without myelopathy or radiculopathy, lumbosacral region  03/20/2018  . Other specified dorsopathies, sacral and sacrococcygeal region 03/20/2018  . Chronic pain syndrome 02/26/2018  . Cancer related pain 02/26/2018  . 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 Chronic low back pain (Secondary Area of Pain) (Bilateral) 02/26/2018  . 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 Chronic lower extremity pain [facility] Area of Pain) (Bilateral) (R>L) 02/26/2018  . Chronic hip pain (Fourth Area of Pain) (Bilateral) (R>L) 02/26/2018  . Neurogenic pain 02/26/2018  . Pharmacologic therapy 02/26/2018  . Problems influencing health status 02/26/2018  . Long term prescription benzodiazepine use 02/26/2018  . Long term prescription opiate use 02/26/2018  . Acute systolic heart failure ([place])   . Non-ST elevation (NSTEMI) myocardial infarction ([place])   . History of uterine cancer 12/08/2017  . Generalized anxiety disorder 12/08/2017  . GERD (gastroesophageal reflux disease) 12/08/2017  . Substernal chest pain 12/08/2017  . Elevated brain natriuretic peptide (BNP) level 12/08/2017  . Elevated troponin 12/08/2017  . Embedded tick of upper back excluding scapular region 12/08/2017  . Chronic sacroiliac joint pain 06/21/2017  . Disorder of skeletal system 06/21/2017  . Other long term (current) drug therapy 06/21/2017  . Other specified health status 06/21/2017  . Chronic chest wall pain (Primary Area of Pain) (Right) 06/21/2017  . Chronic post-thoracotomy pain 03/03/2017  . 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 Adenocarcinoma  of right lung, stage 1 ([place]) 07/28/2016  . S/P lobectomy of lung 05/09/2016  . DVT (deep venous thrombosis) ([place]) 04/26/2016  . Heart murmur   . Acute exacerbation of chronic obstructive pulmonary disease (COPD) ([place]) 08/29/2015  . Essential hypertension 08/29/2015  . Hypercholesterolemia 08/29/2015  . CKD (chronic kidney disease), stage III ([place]) 08/29/2015    . COPD (chronic obstructive pulmonary disease) ([place]) 04/27/2015  . Acute on chronic respiratory failure with hypoxia ([place]) 04/27/2015  . Infected sebaceous cyst 08/16/2011    Past Surgical History:  Procedure Laterality Date  . ANKLE SURGERY Right    horse accident  . APPENDECTOMY    . BACK SURGERY    . CHOLECYSTECTOMY    . EYE SURGERY     lense implant  . FRACTURE SURGERY    . HIP SURGERY  01/2010   Left Hip   . INNER EAR SURGERY Bilateral 1970   related to severe ear infections  . KNEE SURGERY     Right knee  . LEFT HEART CATH AND CORONARY ANGIOGRAPHY N/A 12/11/2017   Procedure: LEFT HEART CATH AND CORONARY ANGIOGRAPHY;  Surgeon: Jettie Booze, MD;  Location: [facility];  Service: Cardiovascular;  Laterality: N/A;  . LOBECTOMY Right 05/09/2016   Procedure: RIGHT UPPER LOBECTOMY;  Surgeon: [name], MD;  Location: [facility];  Service: Thoracic;  Laterality: Right;  . MASS EXCISION  08/25/2011   Procedure: EXCISION MASS;  Surgeon: Harl Bowie, MD;  Location: [facility];  Service: General;  Laterality: N/A;  excision left neck mass  . TOTAL ABDOMINAL HYSTERECTOMY    . VIDEO ASSISTED THORACOSCOPY (VATS)/WEDGE RESECTION Right 05/09/2016   Procedure: VIDEO ASSISTED THORACOSCOPY (VATS)/WEDGE RESECTION;  Surgeon: [name], MD;  Location: [facility];  Service: Thoracic;  Laterality: Right;     OB History   None      Home Medications    Prior to Admission medications   Medication Sig Start Date End Date Taking? Authorizing Provider  albuterol (PROVENTIL HFA;VENTOLIN HFA) 108 (90 BASE) MCG/ACT inhaler Inhale 2 puffs into the lungs every 6 (six) hours as needed. Patient taking differently: Inhale 2 puffs into the lungs every 4 (four) hours as needed for wheezing or shortness of breath.  04/28/15   Hosie Poisson, MD  albuterol (PROVENTIL) (2.5 MG/3ML) 0.083% nebulizer solution Take 3 mLs (2.5 mg total) by nebulization every 2 (two)  hours as needed for wheezing or shortness of breath. 04/28/15   Hosie Poisson, MD  aluminum-magnesium hydroxide-simethicone (MAALOX) 353-614-43 MG/5ML SUSP Take 30 mLs by mouth as needed (indigestion).    [provider]  amLODipine (NORVASC) 5 MG tablet Take 5 mg by mouth daily.    [provider]  atorvastatin (LIPITOR) 20 MG tablet Take 20 mg by mouth daily.     [provider]  diazepam (VALIUM) 5 MG tablet Take 2.5 mg by mouth every 8 (eight) hours as needed for anxiety (for nerves). Must last 30 days. Filled 11-28-17    [provider]  Fluticasone-Salmeterol (ADVAIR DISKUS) 250-50 MCG/DOSE AEPB Inhale 1 puff into the lungs every 12 (twelve) hours. 08/30/15   Debbe Odea, MD  gabapentin (NEURONTIN) 300 MG capsule Take 1 capsule (300 mg total) by mouth 3 (three) times daily. 12/12/17   Domenic Polite, MD  Glucosamine HCl (GLUCOSAMINE PO) Take 1 tablet by mouth daily.    [provider]  HYDROcodone-acetaminophen (NORCO/VICODIN) 5-325 MG tablet Take 2 tablets by mouth every 6 (six) hours  as needed. 03/26/18   Virgel Manifold, MD  lisinopril (PRINIVIL,ZESTRIL) 10 MG tablet Take 1 tablet (10 mg total) by mouth every morning. 12/12/17   Domenic Polite, MD  Magnesium 250 MG TABS Take 250 mg by mouth daily.    [provider]  meloxicam (MOBIC) 7.5 MG tablet Take 7.5 mg by mouth daily.    [provider]  Menthol, Topical Analgesic, (BERRI-FREEZ PAIN RELIEVING) 10 % LIQD Apply 1 application topically as needed (pain).    [provider]  metoCLOPramide (REGLAN) 5 MG tablet Take 5 mg by mouth 2 (two) times daily.     [provider]  metoprolol tartrate (LOPRESSOR) 25 MG tablet Take 0.5 tablets (12.5 mg total) by mouth 2 (two) times daily. 12/12/17   Domenic Polite, MD  Multiple Vitamins-Minerals (WOMENS 50+ ADVANCED PO) Take 1 tablet by mouth daily.    [provider]  omega-3 acid ethyl esters (LOVAZA) 1 g capsule  Take by mouth 2 (two) times daily.    [provider]  pantoprazole (PROTONIX) 40 MG tablet Take 1 tablet (40 mg total) by mouth daily at 6 (six) AM. 04/28/15   Hosie Poisson, MD  predniSONE (DELTASONE) 20 MG tablet Take 20 mg by mouth daily with breakfast.    [provider]  predniSONE (DELTASONE) 20 MG tablet Take 2 tablets (40 mg total) by mouth daily. 03/26/18   Virgel Manifold, MD  raloxifene (EVISTA) 60 MG tablet Take 60 mg by mouth daily.    [provider]  sertraline (ZOLOFT) 100 MG tablet Take 100 mg by mouth daily.    [provider]  tiotropium (SPIRIVA) 18 MCG inhalation capsule Place 18 mcg into inhaler and inhale daily.      [provider]  traMADol (ULTRAM) 50 MG tablet Take 50 mg by mouth 3 (three) times daily as needed. 12/04/17   [provider]    Family History Family History  Problem Relation Age of Onset  . Heart disease Mother   . Heart disease Father   . Cancer Sister        #1, breast  . Heart disease Brother        had CABG    Social History Social History   Tobacco Use  . Smoking status: Former Smoker    Packs/day: 2.00    Years: 51.00    Pack years: 102.00    Types: Cigarettes    Last attempt to quit: 05/04/2012    Years since quitting: 5.9  . Smokeless tobacco: Never Used  Substance Use Topics  . Alcohol use: No    Alcohol/week: 0.0 standard drinks  . Drug use: No     Allergies   Boniva [ibandronate sodium] and Lyrica [pregabalin]   Review of Systems Review of Systems  All systems reviewed and negative, other than as noted in HPI.  Physical Exam Updated Vital Signs BP 118/79   Pulse 92   Temp 98.5 F (36.9 C) (Oral)   Resp 18   Ht 5\' 2"  (1.575 m)   Wt 70.8 kg   SpO2 97%   BMI 28.53 kg/m   Physical Exam  Constitutional: She appears well-developed and well-nourished. No distress.  HENT:  Head: Normocephalic and atraumatic.  Eyes: Conjunctivae are normal. Right eye exhibits  no discharge. Left eye exhibits no discharge.  Neck: Neck supple.  Cardiovascular: Normal rate, regular rhythm and normal heart sounds. Exam reveals no gallop and no friction rub.  No murmur heard. Pulmonary/Chest: Effort normal.  No respiratory distress. She has wheezes.  Abdominal: Soft. She exhibits no distension. There is no tenderness.  Musculoskeletal: She exhibits no edema or tenderness.  Lower extremities symmetric as compared to each other. No calf tenderness. Negative Homan's. No palpable cords.   Neurological: She is alert.  Skin: Skin is warm and dry.  Psychiatric: She has a normal mood and affect. Her behavior is normal. Thought content normal.  Nursing note and vitals reviewed.    ED Treatments / Results  Labs (all labs ordered are listed, but only abnormal results are displayed) Labs Reviewed  CBC WITH DIFFERENTIAL/PLATELET - Abnormal; Notable for the following components:      Result Value   WBC 13.1 (*)    Neutro Abs 12.4 (*)    Lymphs Abs 0.4 (*)    All other components within normal limits  BASIC METABOLIC PANEL - Abnormal; Notable for the following components:   Potassium 3.2 (*)    Glucose, Bld 191 (*)    Creatinine, Ser 1.38 (*)    Calcium 8.7 (*)    GFR calc non Af Amer 37 (*)    GFR calc Af Amer 42 (*)    All other components within normal limits    EKG EKG Interpretation  Date/Time:  Monday March 26 2018 13:57:07 EDT Ventricular Rate:  98 PR Interval:    QRS Duration: 95 QT Interval:  370 QTC Calculation: 473 R Axis:   65 Text Interpretation:  Sinus rhythm Non-specific ST-t changes Confirmed by Virgel Manifold [phone]) on 03/26/2018 2:30:24 PM   Radiology No results found.  Procedures Procedures (including critical care time)  Medications Ordered in ED Medications  LORazepam (ATIVAN) injection 1 mg (1 mg Intravenous Given 03/26/18 1511)  potassium chloride SA (K-DUR,KLOR-CON) CR tablet 20 mEq (20 mEq Oral Given 03/26/18 1642)      Initial Impression / Assessment and Plan / ED Course  I have reviewed the triage vital signs and the nursing notes.  Pertinent labs & imaging results that were available during my care of the patient were reviewed by me and considered in my medical decision making (see chart for details).     77 year old female with dyspnea.  Clinically suspect COPD exacerbation.  She is now feeling much better I feel she is appropriate for further outpatient treatment.  She will be placed on steroids.  Vicodin for her rib pain.  Discussed need to follow-up as an outpatient emergent return precautions sooner discussed.  I have reviewed the triage vital signs and the nursing notes. Prior records were reviewed for additional information.    Pertinent labs & imaging results that were available during my care of the patient were reviewed by me and considered in my medical decision making (see chart for details).   Final Clinical Impressions(s) / ED Diagnoses   Final diagnoses:  COPD exacerbation [facility])    ED Discharge Orders         Ordered    predniSONE (DELTASONE) 20 MG tablet  Daily     03/26/18 1638    HYDROcodone-acetaminophen (NORCO/VICODIN) 5-325 MG tablet  Every 6 hours PRN     03/26/18 1638           Virgel Manifold, MD 04/05/18 1529

## 2018-04-10 NOTE — Progress Notes (Signed)
Patient's Name: [name]  MRN: [id number]  Referring Provider: Husain, Karrar, MD  DOB: 05/28/1941  PCP: Husain, Karrar, MD  DOS: 04/11/2018  Note by: Francisco A Naveira, MD  Service setting: Ambulatory outpatient  Specialty: Interventional Pain Management  Location: [facility] (AMB) Pain Management Facility    Patient type: Established   Primary Reason(s) for Visit: Encounter for post-procedure evaluation of chronic illness with mild to moderate exacerbation CC: Chest Pain  HPI  [name] is a 77 y.o. year old, female patient, who comes today for a post-procedure evaluation. She has Infected sebaceous cyst; COPD (chronic obstructive pulmonary disease) (HCC); Acute on chronic respiratory failure with hypoxia (HCC); Acute exacerbation of chronic obstructive pulmonary disease (COPD) (HCC); Essential hypertension; Hypercholesterolemia; CKD (chronic kidney disease), stage III (HCC); Heart murmur; DVT (deep venous thrombosis) (HCC); S/P lobectomy of lung; Adenocarcinoma of right lung, stage 1 (HCC); Neuropathic pain syndrome (non-herpetic); Chronic post-thoracotomy pain; Chronic sacroiliac joint pain; Disorder of skeletal system; Other long term (current) drug therapy; Other specified health status; Chronic chest wall pain (Primary Area of Pain) (Right); History of uterine cancer; Generalized anxiety disorder; GERD (gastroesophageal reflux disease); Substernal chest pain; Elevated brain natriuretic peptide (BNP) level; Elevated troponin; Embedded tick of upper back excluding scapular region; Non-ST elevation (NSTEMI) myocardial infarction (HCC); Acute systolic heart failure (HCC); Chronic pain syndrome; Cancer related pain; 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); Chronic low back pain (Secondary Area of Pain) (Bilateral); 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); Chronic lower extremity pain (Tertiary Area of Pain) (Bilateral) (R>L); Chronic hip pain (Fourth Area of Pain)  (Bilateral) (R>L); Neurogenic pain; Pharmacologic therapy; Problems influencing health status; Long term prescription benzodiazepine use; Long term prescription opiate use; Chronic rib pain (Primary Area of Pain) (Right); DDD (degenerative disc disease), lumbar; Chronic fracture of pubic ramus, sequela (Right); History of femoral neck fracture, sequela (Left); Lumbar facet arthropathy (Bilateral); Lumbar facet syndrome (Bilateral); Osteoarthritis of facet joint of lumbar spine; Spondylosis without myelopathy or radiculopathy, lumbosacral region; Other specified dorsopathies, sacral and sacrococcygeal region; Left-sided chest wall pain; Rib pain (Left); and Injury of peripheral nerves of thorax, sequela on their problem list. Her primarily concern today is the Chest Pain  Pain Assessment: Location: Left Rib cage Radiating: Pain radiaties across to the back and up under her breast Onset: More than a month ago Duration: Chronic pain Quality: Throbbing, Burning, Aching, Stabbing Severity: 6 /10 (subjective, self-reported pain score)  Note: Reported level is inconsistent with clinical observations. Clinically the patient looks like a 2/10 A 2/10 is viewed as "Mild to Moderate" and described as noticeable and distracting. Impossible to hide from other people. More frequent flare-ups. Still possible to adapt and function close to normal. It can be very annoying and may have occasional stronger flare-ups. With discipline, patients may get used to it and adapt. [name] does not seem to understand the use of our objective pain scale When using our objective Pain Scale, levels between 6 and 10/10 are said to belong in an emergency room, as it progressively worsens from a 6/10, described as severely limiting, requiring emergency care not usually available at an outpatient pain management facility. At a 6/10 level, communication becomes difficult and requires great effort. Assistance to reach the emergency  department may be required. Facial flushing and profuse sweating along with potentially dangerous increases in heart rate and blood pressure will be evident. Effect on ADL: everything Timing: Intermittent Modifying factors: nothing BP: (!) 141/106  HR: 88  Ms.   Hernandez comes in today for post-procedure evaluation after the treatment done on 03/27/2018.  Further details on both, my assessment(s), as well as the proposed treatment plan, please see below.  Post-Procedure Assessment  03/27/2018 Procedure: Diagnostic left-sided T7, T8, T9, T10  intercostal nerve blocks #1 under fluoroscopic guidance and IV sedation Pre-procedure pain score:  6/10 Post-procedure pain score: 0/10 (100% relief) Influential Factors: BMI: 25.79 kg/m Intra-procedural challenges: None observed.         Assessment challenges: None detected.              Reported side-effects: None.        Post-procedural adverse reactions or complications: None reported         Sedation: Sedation provided. When no sedatives are used, the analgesic levels obtained are directly associated to the effectiveness of the local anesthetics. However, when sedation is provided, the level of analgesia obtained during the initial 1 hour following the intervention, is believed to be the result of a combination of factors. These factors may include, but are not limited to: 1. The effectiveness of the local anesthetics used. 2. The effects of the analgesic(s) and/or anxiolytic(s) used. 3. The degree of discomfort experienced by the patient at the time of the procedure. 4. The patients ability and reliability in recalling and recording the events. 5. The presence and influence of possible secondary gains and/or psychosocial factors. Reported result: Relief experienced during the 1st hour after the procedure: 100 % (Ultra-Short Term Relief) [name] has indicated area to have been numb during this time. Interpretative annotation: Clinically  appropriate result. Analgesia during this period is likely to be Local Anesthetic and/or IV Sedative (Analgesic/Anxiolytic) related.          Effects of local anesthetic: The analgesic effects attained during this period are directly associated to the localized infiltration of local anesthetics and therefore cary significant diagnostic value as to the etiological location, or anatomical origin, of the pain. Expected duration of relief is directly dependent on the pharmacodynamics of the local anesthetic used. Long-acting (4-6 hours) anesthetics used.  Reported result: Relief during the next 4 to 6 hour after the procedure: 100 % (as soon as the numbness wear off the pain was back. Numbness lasted until next morning.) (Short-Term Relief) [name] has indicated area to have been numb during this time. Interpretative annotation: Clinically appropriate result. Analgesia during this period is likely to be Local Anesthetic-related.          Long-term benefit: Defined as the period of time past the expected duration of local anesthetics (1 hour for short-acting and 4-6 hours for long-acting). With the possible exception of prolonged sympathetic blockade from the local anesthetics, benefits during this period are typically attributed to, or associated with, other factors such as analgesic sensory neuropraxia, antiinflammatory effects, or beneficial biochemical changes provided by agents other than the local anesthetics.  Reported result: Extended relief following procedure: 0 % (Long-Term Relief)            Interpretative annotation: Clinically possible results. Good relief. No permanent benefit expected. Inflammation plays a part in the etiology to the pain.          Current benefits: Defined as reported results that persistent at this point in time.   Analgesia: 0 %            Function: Back to baseline ROM: Back to baseline Interpretative annotation: Recurrence of symptoms. No permanent benefit  expected. Effective diagnostic intervention.  Interpretation: Results would suggest a successful diagnostic intervention.                  Plan:  Consider diagnostic procedure No.: 2          Laboratory Chemistry  Inflammation Markers (CRP: Acute Phase) (ESR: Chronic Phase) Lab Results  Component Value Date   LATICACIDVEN 1.6 01/31/2010                         Renal Markers Lab Results  Component Value Date   BUN 20 03/26/2018   CREATININE 1.38 (H) 03/26/2018   BCR 7 (L) 01/17/2018   GFRAA 42 (L) 03/26/2018   GFRNONAA 37 (L) 03/26/2018                             Hepatic Markers Lab Results  Component Value Date   AST 17 01/26/2018   ALT 11 01/26/2018   ALBUMIN 3.8 01/26/2018                        Neuropathy Markers Lab Results  Component Value Date   HGBA1C 5.3 08/29/2015                        Hematology Parameters Lab Results  Component Value Date   INR 1.00 12/09/2017   LABPROT 13.1 12/09/2017   APTT 30 05/05/2016   PLT 226 03/26/2018   HGB 12.4 03/26/2018   HCT 39.1 03/26/2018                        CV Markers Lab Results  Component Value Date   BNP 116.2 (H) 12/08/2017   TROPONINI 0.23 ([place]) 12/08/2017                         Note: Lab results reviewed.  Recent Imaging Results   Results for orders placed in visit on 03/22/18  DG C-Arm 1-60 Min-No Report   Narrative Fluoroscopy was utilized by the requesting physician.  No radiographic  interpretation.    Interpretation Report: Fluoroscopy was used during the procedure to assist with needle guidance. The images were interpreted intraoperatively by the requesting physician.  Meds   Current Outpatient Medications:  .  albuterol (PROVENTIL HFA;VENTOLIN HFA) 108 (90 BASE) MCG/ACT inhaler, Inhale 2 puffs into the lungs every 6 (six) hours as needed. (Patient taking differently: Inhale 2 puffs into the lungs every 4 (four) hours as needed for wheezing or shortness of breath. ), Disp: 18 g,  Rfl: 1 .  albuterol (PROVENTIL) (2.5 MG/3ML) 0.083% nebulizer solution, Take 3 mLs (2.5 mg total) by nebulization every 2 (two) hours as needed for wheezing or shortness of breath., Disp: 75 mL, Rfl: 12 .  aluminum-magnesium hydroxide-simethicone (MAALOX) 678-938-10 MG/5ML SUSP, Take 30 mLs by mouth as needed (indigestion)., Disp: , Rfl:  .  amLODipine (NORVASC) 5 MG tablet, Take 5 mg by mouth daily., Disp: , Rfl:  .  atorvastatin (LIPITOR) 20 MG tablet, Take 20 mg by mouth daily. , Disp: , Rfl:  .  diazepam (VALIUM) 5 MG tablet, Take 2.5 mg by mouth every 8 (eight) hours as needed for anxiety (for nerves). Must last 30 days. Filled 11-28-17, Disp: , Rfl:  .  Fluticasone-Salmeterol (ADVAIR DISKUS) 250-50 MCG/DOSE AEPB, Inhale 1 puff into the lungs every 12 (twelve)  hours., Disp: 60 each, Rfl: 0 .  gabapentin (NEURONTIN) 400 MG capsule, Take 1 capsule (400 mg total) by mouth 3 (three) times daily., Disp: 90 capsule, Rfl: 2 .  Glucosamine HCl (GLUCOSAMINE PO), Take 1 tablet by mouth daily., Disp: , Rfl:  .  lisinopril (PRINIVIL,ZESTRIL) 10 MG tablet, Take 1 tablet (10 mg total) by mouth every morning., Disp: 30 tablet, Rfl: 0 .  Magnesium 250 MG TABS, Take 250 mg by mouth daily., Disp: , Rfl:  .  meloxicam (MOBIC) 7.5 MG tablet, Take 7.5 mg by mouth daily., Disp: , Rfl:  .  Menthol, Topical Analgesic, (BERRI-FREEZ PAIN RELIEVING) 10 % LIQD, Apply 1 application topically as needed (pain)., Disp: , Rfl:  .  metoCLOPramide (REGLAN) 5 MG tablet, Take 5 mg by mouth 2 (two) times daily. , Disp: , Rfl:  .  metoprolol tartrate (LOPRESSOR) 25 MG tablet, Take 0.5 tablets (12.5 mg total) by mouth 2 (two) times daily., Disp: 60 tablet, Rfl: 0 .  Multiple Vitamins-Minerals (WOMENS 50+ ADVANCED PO), Take 1 tablet by mouth daily., Disp: , Rfl:  .  omega-3 acid ethyl esters (LOVAZA) 1 g capsule, Take by mouth 2 (two) times daily., Disp: , Rfl:  .  pantoprazole (PROTONIX) 40 MG tablet, Take 1 tablet (40 mg total) by  mouth daily at 6 (six) AM., Disp: 30 tablet, Rfl: 0 .  raloxifene (EVISTA) 60 MG tablet, Take 60 mg by mouth daily., Disp: , Rfl:  .  sertraline (ZOLOFT) 100 MG tablet, Take 100 mg by mouth daily., Disp: , Rfl:  .  tiotropium (SPIRIVA) 18 MCG inhalation capsule, Place 18 mcg into inhaler and inhale daily.  , Disp: , Rfl:  .  traMADol (ULTRAM) 50 MG tablet, Take 50 mg by mouth 3 (three) times daily as needed., Disp: , Rfl:   ROS  Constitutional: Denies any fever or chills Gastrointestinal: No reported hemesis, hematochezia, vomiting, or acute GI distress Musculoskeletal: Denies any acute onset joint swelling, redness, loss of ROM, or weakness Neurological: No reported episodes of acute onset apraxia, aphasia, dysarthria, agnosia, amnesia, paralysis, loss of coordination, or loss of consciousness  Allergies  [name] is allergic to boniva [ibandronate sodium] and lyrica [pregabalin].  [place]  Drug: [name]  reports that she does not use drugs. Alcohol:  reports that she does not drink alcohol. Tobacco:  reports that she quit smoking about 5 years ago. Her smoking use included cigarettes. She has a 102.00 pack-year smoking history. She has never used smokeless tobacco. Medical:  has a past medical history of Adenocarcinoma of right lung, stage 1 ([place]) (07/28/2016), Anxiety, Cancer ([place]), COPD (chronic obstructive pulmonary disease) ([place]), Cyst, Depression, GERD (gastroesophageal reflux disease), Heart murmur, Hyperlipidemia, Hypertension, Low back pain, Lumbar back pain, Osteoarthritis, and Osteoporosis. Surgical: Ms. Perren  has a past surgical history that includes Cholecystectomy; Appendectomy; Total abdominal hysterectomy; Back surgery; Knee surgery; Hip surgery (01/2010); Mass excision (08/25/2011); Ankle surgery (Right); Fracture surgery; Inner ear surgery (Bilateral, 1970); Eye surgery; Video assisted thoracoscopy (vats)/wedge resection (Right, 05/09/2016); Lobectomy (Right, 05/09/2016);  and LEFT HEART CATH AND CORONARY ANGIOGRAPHY (N/A, 12/11/2017). Family: family history includes Cancer in her sister; Heart disease in her brother, father, and mother.  Constitutional Exam  General appearance: Well nourished, well developed, and well hydrated. In no apparent acute distress Vitals:   04/11/18 1253  BP: (!) 141/106  Pulse: 88  Temp: 98 F (36.7 C)  SpO2: 95%  Weight: 141 lb (64 kg)  Height: 5' 2" (1.575 m)  BMI Assessment: Estimated body mass index is 25.79 kg/m as calculated from the following:   Height as of this encounter: 5' 2" (1.575 m).   Weight as of this encounter: 141 lb (64 kg).  BMI interpretation table: BMI level Category Range association with higher incidence of chronic pain  <18 kg/m2 Underweight   18.5-24.9 kg/m2 Ideal body weight   25-29.9 kg/m2 Overweight Increased incidence by 20%  30-34.9 kg/m2 Obese (Class I) Increased incidence by 68%  35-39.9 kg/m2 Severe obesity (Class II) Increased incidence by 136%  >40 kg/m2 Extreme obesity (Class III) Increased incidence by 254%   Patient's current BMI Ideal Body weight  Body mass index is 25.79 kg/m. Ideal body weight: 50.1 kg (110 lb 7.2 oz) Adjusted ideal body weight: 55.6 kg (122 lb 10.7 oz)   BMI Readings from Last 4 Encounters:  04/11/18 25.79 kg/m  03/26/18 28.53 kg/m  03/22/18 29.08 kg/m  03/21/18 29.08 kg/m   Wt Readings from Last 4 Encounters:  04/11/18 141 lb (64 kg)  03/26/18 156 lb (70.8 kg)  03/22/18 159 lb (72.1 kg)  03/21/18 159 lb (72.1 kg)  Psych/Mental status: Alert, oriented x 3 (person, place, & time)       Eyes: PERLA Respiratory: No evidence of acute respiratory distress  Cervical Spine Area Exam  Skin & Axial Inspection: No masses, redness, edema, swelling, or associated skin lesions Alignment: Symmetrical Functional ROM: Unrestricted ROM      Stability: No instability detected Muscle Tone/Strength: Functionally intact. No obvious neuro-muscular anomalies  detected. Sensory (Neurological): Unimpaired Palpation: No palpable anomalies              Upper Extremity (UE) Exam    Side: Right upper extremity  Side: Left upper extremity  Skin & Extremity Inspection: Skin color, temperature, and hair growth are WNL. No peripheral edema or cyanosis. No masses, redness, swelling, asymmetry, or associated skin lesions. No contractures.  Skin & Extremity Inspection: Skin color, temperature, and hair growth are WNL. No peripheral edema or cyanosis. No masses, redness, swelling, asymmetry, or associated skin lesions. No contractures.  Functional ROM: Unrestricted ROM          Functional ROM: Unrestricted ROM          Muscle Tone/Strength: Functionally intact. No obvious neuro-muscular anomalies detected.  Muscle Tone/Strength: Functionally intact. No obvious neuro-muscular anomalies detected.  Sensory (Neurological): Unimpaired          Sensory (Neurological): Unimpaired          Palpation: No palpable anomalies              Palpation: No palpable anomalies              Provocative Test(s):  Phalen's test: deferred Tinel's test: deferred Apley's scratch test (touch opposite shoulder):  Action 1 (Across chest): deferred Action 2 (Overhead): deferred Action 3 (LB reach): deferred   Provocative Test(s):  Phalen's test: deferred Tinel's test: deferred Apley's scratch test (touch opposite shoulder):  Action 1 (Across chest): deferred Action 2 (Overhead): deferred Action 3 (LB reach): deferred    Thoracic Spine Area Exam  Skin & Axial Inspection: No masses, redness, or swelling Alignment: Symmetrical Functional ROM: Unrestricted ROM Stability: No instability detected Muscle Tone/Strength: Functionally intact. No obvious neuro-muscular anomalies detected. Sensory (Neurological): Unimpaired Muscle strength & Tone: No palpable anomalies  Lumbar Spine Area Exam  Skin & Axial Inspection: No masses, redness, or swelling Alignment: Symmetrical Functional  ROM: Unrestricted ROM       Stability: No  instability detected Muscle Tone/Strength: Functionally intact. No obvious neuro-muscular anomalies detected. Sensory (Neurological): Unimpaired Palpation: No palpable anomalies       Provocative Tests: Hyperextension/rotation test: deferred today       Lumbar quadrant test (Kemp's test): deferred today       Lateral bending test: deferred today       Patrick's Maneuver: deferred today                   FABER test: deferred today                   S-I anterior distraction/compression test: deferred today         S-I lateral compression test: deferred today         S-I Thigh-thrust test: deferred today         S-I Gaenslen's test: deferred today          Gait & Posture Assessment  Ambulation: Unassisted Gait: Relatively normal for age and body habitus Posture: WNL   Lower Extremity Exam    Side: Right lower extremity  Side: Left lower extremity  Stability: No instability observed          Stability: No instability observed          Skin & Extremity Inspection: Skin color, temperature, and hair growth are WNL. No peripheral edema or cyanosis. No masses, redness, swelling, asymmetry, or associated skin lesions. No contractures.  Skin & Extremity Inspection: Skin color, temperature, and hair growth are WNL. No peripheral edema or cyanosis. No masses, redness, swelling, asymmetry, or associated skin lesions. No contractures.  Functional ROM: Unrestricted ROM                  Functional ROM: Unrestricted ROM                  Muscle Tone/Strength: Functionally intact. No obvious neuro-muscular anomalies detected.  Muscle Tone/Strength: Functionally intact. No obvious neuro-muscular anomalies detected.  Sensory (Neurological): Unimpaired  Sensory (Neurological): Unimpaired  Palpation: No palpable anomalies  Palpation: No palpable anomalies   Assessment  Primary Diagnosis & Pertinent Problem List: The primary encounter diagnosis was Left-sided chest  wall pain. 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  Status Diagnosis  Controlled Controlled Controlled 1. Left-sided chest wall pain   2. Chronic chest wall pain (Primary Area of Pain) (Right)   3. Chronic post-thoracotomy pain   4. 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 Chronic rib pain (Primary Area of Pain) (Right)   6. Chronic low back pain (Secondary Area of Pain) (Bilateral)   7. Chronic lower extremity pain (Tertiary Area of Pain) (Bilateral) (R>L)   8. Chronic hip pain (Fourth Area of Pain) (Bilateral) (R>L)   9. Neurogenic pain   10. Neuropathic pain syndrome (non-herpetic)     Problems updated and reviewed during this visit: No problems updated. Plan of Care  Pharmacotherapy (Medications Ordered): Meds ordered this encounter  Medications  . gabapentin (NEURONTIN) 400 MG capsule    Sig: Take 1 capsule (400 mg total) by mouth 3 (three) times daily.  Dispense:  90 capsule    Refill:  2    Do not place medication on "Automatic Refill". Fill one day early if pharmacy is closed on scheduled refill date.   Medications administered today: [name] had no medications administered during this visit.  Procedure Orders    No procedure(s) ordered today   Lab Orders  No laboratory test(s) ordered today   Imaging Orders  No imaging studies ordered today   Referral Orders  No referral(s) requested today    Interventional management options: Planned, scheduled, and/or pending:   Diagnostic  Left Intercostal NB #2 (T7, T8, T9, T10) under fluoro and IV sedadtion.   Considering:   Diagnostic right-sided intercostal nerve blocks  Possible right-sided intercostal nerve RFA  Diagnostic right-sided thoracic transforaminal ESI vs. Selective nerve root blocks  Diagnostic bilateral lumbar facet block  Possible bilateral lumbar facet RFA  Diagnostic bilateral sacroiliac joint block  Possible bilateral sacroiliac joint RFA  Diagnostic right-sided L4-5 interlaminar ESI  Diagnostic bilateral L4-5 vs L5-S1transforaminal ESI  Diagnostic (Midline) caudal ESI + diagnostic epidurogram  Diagnostic bilateral intra-articular hip joint injection  Diagnostic bilateral femoral nerve + obturator nerve block  Possible bilateral femoral nerve + obturator nerve RFA  Possible Racz procedure  Possible spinal cord stimulator trial    Palliative PRN treatment(s):   None at this time   Provider-requested follow-up: Return for Procedure (w/ sedation): (L) Intercostal NB #2.  Future Appointments  Date Time Provider [place]  05/03/2018  9:15 AM Milinda Pointer, MD [facility] None  05/29/2018  9:30 AM [name], MD [facility]  07/30/2018 12:00 PM [facility] None  08/01/2018 11:00 AM Curt Bears, MD [facility] None   Primary Care Physician: Wenda Low, MD Location: [facility] Note by: Gaspar Cola, MD Date: 04/11/2018; Time: 1:51 PM

## 2018-04-11 ENCOUNTER — Encounter: Payer: Self-pay | Admitting: Pain Medicine

## 2018-04-11 ENCOUNTER — Ambulatory Visit: Payer: Medicare Other | Attending: Pain Medicine | Admitting: Pain Medicine

## 2018-04-11 ENCOUNTER — Other Ambulatory Visit: Payer: Self-pay

## 2018-04-11 VITALS — BP 141/106 | HR 88 | Temp 98.0°F | Ht 62.0 in | Wt 141.0 lb

## 2018-04-11 DIAGNOSIS — J449 Chronic obstructive pulmonary disease, unspecified: Secondary | ICD-10-CM | POA: Diagnosis not present

## 2018-04-11 DIAGNOSIS — G8929 Other chronic pain: Secondary | ICD-10-CM

## 2018-04-11 DIAGNOSIS — E7800 Pure hypercholesterolemia, unspecified: Secondary | ICD-10-CM | POA: Insufficient documentation

## 2018-04-11 DIAGNOSIS — F411 Generalized anxiety disorder: Secondary | ICD-10-CM | POA: Insufficient documentation

## 2018-04-11 DIAGNOSIS — N183 Chronic kidney disease, stage 3 (moderate): Secondary | ICD-10-CM | POA: Diagnosis not present

## 2018-04-11 DIAGNOSIS — M79605 Pain in left leg: Secondary | ICD-10-CM | POA: Diagnosis not present

## 2018-04-11 DIAGNOSIS — R079 Chest pain, unspecified: Secondary | ICD-10-CM | POA: Insufficient documentation

## 2018-04-11 DIAGNOSIS — Z79899 Other long term (current) drug therapy: Secondary | ICD-10-CM | POA: Diagnosis not present

## 2018-04-11 DIAGNOSIS — Z79891 Long term (current) use of opiate analgesic: Secondary | ICD-10-CM | POA: Diagnosis not present

## 2018-04-11 DIAGNOSIS — M533 Sacrococcygeal disorders, not elsewhere classified: Secondary | ICD-10-CM | POA: Insufficient documentation

## 2018-04-11 DIAGNOSIS — M79604 Pain in right leg: Secondary | ICD-10-CM | POA: Diagnosis not present

## 2018-04-11 DIAGNOSIS — J441 Chronic obstructive pulmonary disease with (acute) exacerbation: Secondary | ICD-10-CM | POA: Insufficient documentation

## 2018-04-11 DIAGNOSIS — M5136 Other intervertebral disc degeneration, lumbar region: Secondary | ICD-10-CM | POA: Diagnosis not present

## 2018-04-11 DIAGNOSIS — G894 Chronic pain syndrome: Secondary | ICD-10-CM | POA: Insufficient documentation

## 2018-04-11 DIAGNOSIS — I130 Hypertensive heart and chronic kidney disease with heart failure and stage 1 through stage 4 chronic kidney disease, or unspecified chronic kidney disease: Secondary | ICD-10-CM | POA: Diagnosis not present

## 2018-04-11 DIAGNOSIS — G8912 Acute post-thoracotomy pain: Secondary | ICD-10-CM | POA: Diagnosis not present

## 2018-04-11 DIAGNOSIS — M25551 Pain in right hip: Secondary | ICD-10-CM

## 2018-04-11 DIAGNOSIS — K219 Gastro-esophageal reflux disease without esophagitis: Secondary | ICD-10-CM | POA: Insufficient documentation

## 2018-04-11 DIAGNOSIS — R0789 Other chest pain: Secondary | ICD-10-CM

## 2018-04-11 DIAGNOSIS — I5021 Acute systolic (congestive) heart failure: Secondary | ICD-10-CM | POA: Diagnosis not present

## 2018-04-11 DIAGNOSIS — Z8542 Personal history of malignant neoplasm of other parts of uterus: Secondary | ICD-10-CM | POA: Diagnosis not present

## 2018-04-11 DIAGNOSIS — C3491 Malignant neoplasm of unspecified part of right bronchus or lung: Secondary | ICD-10-CM | POA: Diagnosis not present

## 2018-04-11 DIAGNOSIS — M792 Neuralgia and neuritis, unspecified: Secondary | ICD-10-CM

## 2018-04-11 DIAGNOSIS — G8922 Chronic post-thoracotomy pain: Secondary | ICD-10-CM

## 2018-04-11 DIAGNOSIS — M25552 Pain in left hip: Secondary | ICD-10-CM

## 2018-04-11 DIAGNOSIS — M545 Low back pain, unspecified: Secondary | ICD-10-CM

## 2018-04-11 DIAGNOSIS — R0781 Pleurodynia: Secondary | ICD-10-CM | POA: Diagnosis not present

## 2018-04-11 DIAGNOSIS — L723 Sebaceous cyst: Secondary | ICD-10-CM | POA: Diagnosis not present

## 2018-04-11 MED ORDER — GABAPENTIN 400 MG PO CAPS: 400.0000 mg | ORAL_CAPSULE | Freq: Three times a day (TID) | ORAL | 2 refills | Status: DC

## 2018-04-11 NOTE — Patient Instructions (Addendum)
____________________________________________________________________________________________  Pain Scale  Introduction: The pain score used by this practice is the Verbal Numerical Rating Scale (VNRS-11). This is an 11-point scale. It is for adults and children 10 years or older. There are significant differences in how the pain score is reported, used, and applied. Forget everything you learned in the past and learn this scoring system.  General Information: The scale should reflect your current level of pain. Unless you are specifically asked for the level of your worst pain, or your average pain. If you are asked for one of these two, then it should be understood that it is over the past 24 hours.  Basic Activities of Daily Living (ADL): Personal hygiene, dressing, eating, transferring, and using restroom.  Instructions: Most patients tend to report their level of pain as a combination of two factors, their physical pain and their psychosocial pain. This last one is also known as "suffering" and it is reflection of how physical pain affects you socially and psychologically. From now on, report them separately. From this point on, when asked to report your pain level, report only your physical pain. Use the following table for reference.  Pain Clinic Pain Levels (0-5/10)  Pain Level Score  Description  No Pain 0   Mild pain 1 Nagging, annoying, but does not interfere with basic activities of daily living (ADL). Patients are able to eat, bathe, get dressed, toileting (being able to get on and off the toilet and perform personal hygiene functions), transfer (move in and out of bed or a chair without assistance), and maintain continence (able to control bladder and bowel functions). Blood pressure and heart rate are unaffected. A normal heart rate for a healthy adult ranges from 60 to 100 bpm (beats per minute).   Mild to moderate pain 2 Noticeable and distracting. Impossible to hide from other  people. More frequent flare-ups. Still possible to adapt and function close to normal. It can be very annoying and may have occasional stronger flare-ups. With discipline, patients may get used to it and adapt.   Moderate pain 3 Interferes significantly with activities of daily living (ADL). It becomes difficult to feed, bathe, get dressed, get on and off the toilet or to perform personal hygiene functions. Difficult to get in and out of bed or a chair without assistance. Very distracting. With effort, it can be ignored when deeply involved in activities.   Moderately severe pain 4 Impossible to ignore for more than a few minutes. With effort, patients may still be able to manage work or participate in some social activities. Very difficult to concentrate. Signs of autonomic nervous system discharge are evident: dilated pupils (mydriasis); mild sweating (diaphoresis); sleep interference. Heart rate becomes elevated (>115 bpm). Diastolic blood pressure (lower number) rises above 100 mmHg. Patients find relief in laying down and not moving.   Severe pain 5 Intense and extremely unpleasant. Associated with frowning face and frequent crying. Pain overwhelms the senses.  Ability to do any activity or maintain social relationships becomes significantly limited. Conversation becomes difficult. Pacing back and forth is common, as getting into a comfortable position is nearly impossible. Pain wakes you up from deep sleep. Physical signs will be obvious: pupillary dilation; increased sweating; goosebumps; brisk reflexes; cold, clammy hands and feet; nausea, vomiting or dry heaves; loss of appetite; significant sleep disturbance with inability to fall asleep or to remain asleep. When persistent, significant weight loss is observed due to the complete loss of appetite and sleep deprivation.  Blood   pressure and heart rate becomes significantly elevated. Caution: If elevated blood pressure triggers a pounding headache  associated with blurred vision, then the patient should immediately seek attention at an urgent or emergency care unit, as these may be signs of an impending stroke.    Emergency Department Pain Levels (6-10/10)  Emergency Room Pain 6 Severely limiting. Requires emergency care and should not be seen or managed at an outpatient pain management facility. Communication becomes difficult and requires great effort. Assistance to reach the emergency department may be required. Facial flushing and profuse sweating along with potentially dangerous increases in heart rate and blood pressure will be evident.   Distressing pain 7 Self-care is very difficult. Assistance is required to transport, or use restroom. Assistance to reach the emergency department will be required. Tasks requiring coordination, such as bathing and getting dressed become very difficult.   Disabling pain 8 Self-care is no longer possible. At this level, pain is disabling. The individual is unable to do even the most "basic" activities such as walking, eating, bathing, dressing, transferring to a bed, or toileting. Fine motor skills are lost. It is difficult to think clearly.   Incapacitating pain 9 Pain becomes incapacitating. Thought processing is no longer possible. Difficult to remember your own name. Control of movement and coordination are lost.   The worst pain imaginable 10 At this level, most patients pass out from pain. When this level is reached, collapse of the autonomic nervous system occurs, leading to a sudden drop in blood pressure and heart rate. This in turn results in a temporary and dramatic drop in blood flow to the brain, leading to a loss of consciousness. Fainting is one of the body's self defense mechanisms. Passing out puts the brain in a calmed state and causes it to shut down for a while, in order to begin the healing process.    Summary: 1. Refer to this scale when providing us with your pain level. 2. Be  accurate and careful when reporting your pain level. This will help with your care. 3. Over-reporting your pain level will lead to loss of credibility. 4. Even a level of 1/10 means that there is pain and will be treated at our facility. 5. High, inaccurate reporting will be documented as "Symptom Exaggeration", leading to loss of credibility and suspicions of possible secondary gains such as obtaining more narcotics, or wanting to appear disabled, for fraudulent reasons. 6. Only pain levels of 5 or below will be seen at our facility. 7. Pain levels of 6 and above will be sent to the Emergency Department and the appointment cancelled. ____________________________________________________________________________________________   ____________________________________________________________________________________________  Preparing for Procedure with Sedation  Instructions: . Oral Intake: Do not eat or drink anything for at least 8 hours prior to your procedure. . Transportation: Public transportation is not allowed. Bring an adult driver. The driver must be physically present in our waiting room before any procedure can be started. . Physical Assistance: Bring an adult physically capable of assisting you, in the event you need help. This adult should keep you company at home for at least 6 hours after the procedure. . Blood Pressure Medicine: Take your blood pressure medicine with a sip of water the morning of the procedure. . Blood thinners: Notify our staff if you are taking any blood thinners. Depending on which one you take, there will be specific instructions on how and when to stop it. . Diabetics on insulin: Notify the staff so that you can be   scheduled 1st case in the morning. If your diabetes requires high dose insulin, take only  of your normal insulin dose the morning of the procedure and notify the staff that you have done so. . Preventing infections: Shower with an antibacterial soap  the morning of your procedure. . Build-up your immune system: Take 1000 mg of Vitamin C with every meal (3 times a day) the day prior to your procedure. . Antibiotics: Inform the staff if you have a condition or reason that requires you to take antibiotics before dental procedures. . Pregnancy: If you are pregnant, call and cancel the procedure. . Sickness: If you have a cold, fever, or any active infections, call and cancel the procedure. . Arrival: You must be in the facility at least 30 minutes prior to your scheduled procedure. . Children: Do not bring children with you. . Dress appropriately: Bring dark clothing that you would not mind if they get stained. . Valuables: Do not bring any jewelry or valuables.  Procedure appointments are reserved for interventional treatments only. . No Prescription Refills. . No medication changes will be discussed during procedure appointments. . No disability issues will be discussed.  Reasons to call and reschedule or cancel your procedure: (Following these recommendations will minimize the risk of a serious complication.) . Surgeries: Avoid having procedures within 2 weeks of any surgery. (Avoid for 2 weeks before or after any surgery). . Flu Shots: Avoid having procedures within 2 weeks of a flu shots or . (Avoid for 2 weeks before or after immunizations). . Barium: Avoid having a procedure within 7-10 days after having had a radiological study involving the use of radiological contrast. (Myelograms, Barium swallow or enema study). . Heart attacks: Avoid any elective procedures or surgeries for the initial 6 months after a "Myocardial Infarction" (Heart Attack). . Blood thinners: It is imperative that you stop these medications before procedures. Let us know if you if you take any blood thinner.  . Infection: Avoid procedures during or within two weeks of an infection (including chest colds or gastrointestinal problems). Symptoms associated with  infections include: Localized redness, fever, chills, night sweats or profuse sweating, burning sensation when voiding, cough, congestion, stuffiness, runny nose, sore throat, diarrhea, nausea, vomiting, cold or Flu symptoms, recent or current infections. It is specially important if the infection is over the area that we intend to treat. . Heart and lung problems: Symptoms that may suggest an active cardiopulmonary problem include: cough, chest pain, breathing difficulties or shortness of breath, dizziness, ankle swelling, uncontrolled high or unusually low blood pressure, and/or palpitations. If you are experiencing any of these symptoms, cancel your procedure and contact your primary care physician for an evaluation.  Remember:  Regular Business hours are:  Monday to Thursday 8:00 AM to 4:00 PM  Provider's Schedule: Francisco Naveira, MD:  Procedure days: Tuesday and Thursday 7:30 AM to 4:00 PM  Bilal Lateef, MD:  Procedure days: Monday and Wednesday 7:30 AM to 4:00 PM ____________________________________________________________________________________________    

## 2018-05-03 ENCOUNTER — Ambulatory Visit
Admission: RE | Admit: 2018-05-03 | Discharge: 2018-05-03 | Disposition: A | Discharge status: Home / Self Care | Payer: Medicare Other | Source: Ambulatory Visit | Attending: Pain Medicine | Admitting: Pain Medicine

## 2018-05-03 ENCOUNTER — Encounter: Payer: Self-pay | Admitting: Pain Medicine

## 2018-05-03 ENCOUNTER — Other Ambulatory Visit: Payer: Self-pay

## 2018-05-03 ENCOUNTER — Ambulatory Visit (HOSPITAL_BASED_OUTPATIENT_CLINIC_OR_DEPARTMENT_OTHER): Payer: Medicare Other | Admitting: Pain Medicine

## 2018-05-03 VITALS — BP 154/74 | HR 64 | Temp 98.8°F | Resp 15 | Ht 62.0 in | Wt 153.0 lb

## 2018-05-03 DIAGNOSIS — M5414 Radiculopathy, thoracic region: Secondary | ICD-10-CM | POA: Insufficient documentation

## 2018-05-03 DIAGNOSIS — M546 Pain in thoracic spine: Secondary | ICD-10-CM

## 2018-05-03 DIAGNOSIS — S243XXS Injury of peripheral nerves of thorax, sequela: Secondary | ICD-10-CM | POA: Diagnosis not present

## 2018-05-03 DIAGNOSIS — G8929 Other chronic pain: Secondary | ICD-10-CM

## 2018-05-03 DIAGNOSIS — G588 Other specified mononeuropathies: Secondary | ICD-10-CM

## 2018-05-03 DIAGNOSIS — R0789 Other chest pain: Secondary | ICD-10-CM | POA: Diagnosis present

## 2018-05-03 MED ORDER — MIDAZOLAM HCL 5 MG/5ML IJ SOLN
1.0000 mg | INTRAMUSCULAR | Status: DC | PRN
  Administered 2018-05-03: 3 mg via INTRAVENOUS

## 2018-05-03 MED ORDER — ROPIVACAINE HCL 2 MG/ML IJ SOLN
9.0000 mL | Freq: Once | INTRAMUSCULAR | Status: AC
  Administered 2018-05-03: 9 mL via PERINEURAL

## 2018-05-03 MED ORDER — ROPIVACAINE HCL 2 MG/ML IJ SOLN
INTRAMUSCULAR | Status: AC
  Filled 2018-05-03: qty 10

## 2018-05-03 MED ORDER — FENTANYL CITRATE (PF) 100 MCG/2ML IJ SOLN
INTRAMUSCULAR | Status: AC
  Filled 2018-05-03: qty 2

## 2018-05-03 MED ORDER — LIDOCAINE HCL 2 % IJ SOLN
20.0000 mL | Freq: Once | INTRAMUSCULAR | Status: AC
  Administered 2018-05-03: 400 mg

## 2018-05-03 MED ORDER — DEXAMETHASONE SODIUM PHOSPHATE 10 MG/ML IJ SOLN
INTRAMUSCULAR | Status: AC
  Filled 2018-05-03: qty 1

## 2018-05-03 MED ORDER — MIDAZOLAM HCL 5 MG/5ML IJ SOLN
INTRAMUSCULAR | Status: AC
  Filled 2018-05-03: qty 5

## 2018-05-03 MED ORDER — FENTANYL CITRATE (PF) 100 MCG/2ML IJ SOLN
25.0000 ug | INTRAMUSCULAR | Status: DC | PRN
  Administered 2018-05-03: 75 ug via INTRAVENOUS

## 2018-05-03 MED ORDER — DEXAMETHASONE SODIUM PHOSPHATE 10 MG/ML IJ SOLN
10.0000 mg | Freq: Once | INTRAMUSCULAR | Status: AC
  Administered 2018-05-03: 10 mg

## 2018-05-03 MED ORDER — LIDOCAINE HCL 2 % IJ SOLN
INTRAMUSCULAR | Status: AC
  Filled 2018-05-03: qty 20

## 2018-05-03 MED ORDER — LACTATED RINGERS IV SOLN
1000.0000 mL | Freq: Once | INTRAVENOUS | Status: AC
  Administered 2018-05-03: 1000 mL via INTRAVENOUS

## 2018-05-03 NOTE — Patient Instructions (Signed)

## 2018-05-03 NOTE — Progress Notes (Signed)
Safety precautions to be maintained throughout the outpatient stay will include: orient to surroundings, keep bed in low position, maintain call bell within reach at all times, provide assistance with transfer out of bed and ambulation.  

## 2018-05-03 NOTE — Progress Notes (Addendum)
Patient's Name: [name]  MRN: [id number]  Referring Provider: Wenda Low, MD  DOB: 05-Jul-1941  PCP: Wenda Low, MD  DOS: 05/03/2018  Note by: Gaspar Cola, MD  Service setting: Ambulatory outpatient  Specialty: Interventional Pain Management  Patient type: Established  Location: [facility] (AMB) Pain Management Facility  Visit type: Interventional Procedure   Primary Reason for Visit: Interventional Pain Management Treatment. CC: No chief complaint on file.  Procedure:          Anesthesia, Analgesia, Anxiolysis:  Type: Diagnostic Posterior Intercostal  Nerve Block  #2  Region: Posterolateral Thoracic Area Level: T7, T8, T9, T10 Ribs Laterality: Left-Sided  Type: Moderate (Conscious) Sedation combined with Local Anesthesia Indication(s): Analgesia and Anxiety Route: Intravenous (IV) IV Access: Secured Sedation: Meaningful verbal contact was maintained at all times during the procedure  Local Anesthetic: Lidocaine 1-2%  Position: Prone   Indications: 1. Injury of peripheral nerves of thorax, sequela   2. Intercostal neuralgia (Left)   3. Chronic chest wall pain (Primary Area of Pain)    Pain Score: Pre-procedure: 5 /10 Post-procedure: 5 /10  NOTE: The patient indicates that her left upper quadrant area is numb, suggesting that the block was effective.  Pre-op Assessment:  Ms. Opperman is a 77 y.o. (year old), female patient, seen today for interventional treatment. She  has a past surgical history that includes Cholecystectomy; Appendectomy; Total abdominal hysterectomy; Back surgery; Knee surgery; Hip surgery (01/2010); Mass excision (08/25/2011); Ankle surgery (Right); Fracture surgery; Inner ear surgery (Bilateral, 1970); Eye surgery; Video assisted thoracoscopy (vats)/wedge resection (Right, 05/09/2016); Lobectomy (Right, 05/09/2016); and LEFT HEART CATH AND CORONARY ANGIOGRAPHY (N/A, 12/11/2017). Ms. Fundora has a current medication list which includes the following  prescription(s): albuterol, albuterol, aluminum-magnesium hydroxide-simethicone, amlodipine, atorvastatin, diazepam, fluticasone-salmeterol, gabapentin, glucosamine hcl, lisinopril, magnesium, meloxicam, menthol (topical analgesic), metoclopramide, metoprolol tartrate, multiple vitamins-minerals, omega-3 acid ethyl esters, pantoprazole, raloxifene, sertraline, tiotropium, and tramadol, and the following Facility-Administered Medications: fentanyl and midazolam. Her primarily concern today is the No chief complaint on file.  Initial Vital Signs:  Pulse/HCG Rate: 64ECG Heart Rate: 69 Temp: 97.8 F (36.6 C) Resp: 18 BP: (!) 157/98 SpO2: 98 %  BMI: Estimated body mass index is 27.98 kg/m as calculated from the following:   Height as of this encounter: 5\' 2"  (1.575 m).   Weight as of this encounter: 153 lb (69.4 kg).  Risk Assessment: Allergies: Reviewed. She is allergic to boniva [ibandronate sodium] and lyrica [pregabalin].  Allergy Precautions: None required Coagulopathies: Reviewed. None identified.  Blood-thinner therapy: None at this time Active Infection(s): Reviewed. None identified. Ms. Fein is afebrile  Site Confirmation: Ms. Beynon was asked to confirm the procedure and laterality before marking the site Procedure checklist: Completed Consent: Before the procedure and under the influence of no sedative(s), amnesic(s), or anxiolytics, the patient was informed of the treatment options, risks and possible complications. To fulfill our ethical and legal obligations, as recommended by the American Medical Association's Code of Ethics, I have informed the patient of my clinical impression; the nature and purpose of the treatment or procedure; the risks, benefits, and possible complications of the intervention; the alternatives, including doing nothing; the risk(s) and benefit(s) of the alternative treatment(s) or procedure(s); and the risk(s) and benefit(s) of doing nothing. The  patient was provided information about the general risks and possible complications associated with the procedure. These may include, but are not limited to: failure to achieve desired goals, infection, bleeding, organ or nerve damage, allergic reactions, paralysis, and death.  In addition, the patient was informed of those risks and complications associated to the procedure, such as failure to decrease pain; infection; bleeding; organ or nerve damage with subsequent damage to sensory, motor, and/or autonomic systems, resulting in permanent pain, numbness, and/or weakness of one or several areas of the body; allergic reactions; (i.e.: anaphylactic reaction); and/or death. Furthermore, the patient was informed of those risks and complications associated with the medications. These include, but are not limited to: allergic reactions (i.e.: anaphylactic or anaphylactoid reaction(s)); adrenal axis suppression; blood sugar elevation that in diabetics may result in ketoacidosis or comma; water retention that in patients with history of congestive heart failure may result in shortness of breath, pulmonary edema, and decompensation with resultant heart failure; weight gain; swelling or edema; medication-induced neural toxicity; particulate matter embolism and blood vessel occlusion with resultant organ, and/or nervous system infarction; and/or aseptic necrosis of one or more joints. Finally, the patient was informed that Medicine is not an exact science; therefore, there is also the possibility of unforeseen or unpredictable risks and/or possible complications that may result in a catastrophic outcome. The patient indicated having understood very clearly. We have given the patient no guarantees and we have made no promises. Enough time was given to the patient to ask questions, all of which were answered to the patient's satisfaction. Ms. Plancarte has indicated that she wanted to continue with the procedure. Attestation:  I, the ordering provider, attest that I have discussed with the patient the benefits, risks, side-effects, alternatives, likelihood of achieving goals, and potential problems during recovery for the procedure that I have provided informed consent. Date  Time: 05/03/2018  8:58 AM  Pre-Procedure Preparation:  Monitoring: As per clinic protocol. Respiration, ETCO2, SpO2, BP, heart rate and rhythm monitor placed and checked for adequate function Safety Precautions: Patient was assessed for positional comfort and pressure points before starting the procedure. Time-out: I initiated and conducted the "Time-out" before starting the procedure, as per protocol. The patient was asked to participate by confirming the accuracy of the "Time Out" information. Verification of the correct person, site, and procedure were performed and confirmed by me, the nursing staff, and the patient. "Time-out" conducted as per Joint Commission's Universal Protocol (UP.01.01.01). Time: 3614  Description of Procedure:          Target Area: The sub-costal neurovascular bundle area Approach: Sub-costal approach Area Prepped: Entire Mid-Axillary Thoracic Region Prepping solution: ChloraPrep (2% chlorhexidine gluconate and 70% isopropyl alcohol) Safety Precautions: Aspiration looking for blood return was conducted prior to all injections. At no point did we inject any substances, as a needle was being advanced. No attempts were made at seeking any paresthesias. Safe injection practices and needle disposal techniques used. Medications properly checked for expiration dates. SDV (single dose vial) medications used. Description of the Procedure: Protocol guidelines were followed. The patient was placed in position over the procedure table. The target area was identified and the area prepped in the usual manner. Skin & deeper tissues infiltrated with local anesthetic. After cleaning the skin with an antiseptic solution, 1-2 mL of dilute  local anesthetic was infiltrated subcutaneously at the planned injection site. The fingers of the palpating hand were used to straddle the insertion site at the inferior border of the rib and fix the skin to avoid unwanted skin movement. Appropriate amount of time allowed to pass for local anesthetics to take effect. The procedure needles were then advanced to the target area at an angle of approximately 20 cephalad to the  skin. Contact with the rib was made. While maintaining the same angle of insertion, the needle was walked off the inferior border of the rib as the skin was allowed to return to its initial position. Then the needle was advanced no more than 3 mm below the inferior margin of the rib. Proper needle placement was secured. Negative aspiration confirmed. Following negative aspiration for blood or air, 3-5 mL of local anesthetic was injected. The solution injected in intermittent fashion, asking for systemic symptoms every 0.5cc of injectate. The needle was then removed and the area cleansed, making sure to leave some of the prepping solution back to take advantage of its long term bactericidal properties. Vitals:   05/03/18 1005 05/03/18 1014 05/03/18 1023 05/03/18 1033  BP: (!) 164/88 (!) 151/99 (!) 169/109 (!) 154/74  Pulse:      Resp: 15 10 19 15   Temp:  98.6 F (37 C)  98.8 F (37.1 C)  TempSrc:  Temporal  Temporal  SpO2: 96% 97% 99% 100%  Weight:      Height:        Start Time: 0954 hrs. End Time: 1004 hrs.   Materials:  Needle(s) Type: Spinal Needle Gauge: 22G Length: 3.5-in Medication(s): Please see orders for medications and dosing details.  Imaging Guidance (Non-Spinal):          Type of Imaging Technique: Fluoroscopy Guidance (Non-Spinal) Indication(s): Assistance in needle guidance and placement for procedures requiring needle placement in or near specific anatomical locations not easily accessible without such assistance. Exposure Time: Please see nurses  notes. Contrast: Before injecting any contrast, we confirmed that the patient did not have an allergy to iodine, shellfish, or radiological contrast. Once satisfactory needle placement was completed at the desired level, radiological contrast was injected. Contrast injected under live fluoroscopy. No contrast complications. See chart for type and volume of contrast used. Fluoroscopic Guidance: I was personally present during the use of fluoroscopy. "Tunnel Vision Technique" used to obtain the best possible view of the target area. Parallax error corrected before commencing the procedure. "Direction-depth-direction" technique used to introduce the needle under continuous pulsed fluoroscopy. Once target was reached, antero-posterior, oblique, and lateral fluoroscopic projection used confirm needle placement in all planes. Images permanently stored in EMR. Interpretation: I personally interpreted the imaging intraoperatively. Adequate needle placement confirmed in multiple planes. Appropriate spread of contrast into desired area was observed. No evidence of afferent or efferent intravascular uptake. Permanent images saved into the patient's record.  Antibiotic Prophylaxis:   Anti-infectives (From admission, onward)   None     Indication(s): None identified  Post-operative Assessment:  Post-procedure Vital Signs:  Pulse/HCG Rate: 6463 Temp: 98.8 F (37.1 C) Resp: 15 BP: (!) 154/74 SpO2: 100 %  EBL: None  Complications: No immediate post-treatment complications observed by team, or reported by patient.  Note: The patient tolerated the entire procedure well. A repeat set of vitals were taken after the procedure and the patient was kept under observation following institutional policy, for this type of procedure. Post-procedural neurological assessment was performed, showing return to baseline, prior to discharge. The patient was provided with post-procedure discharge instructions, including a  section on how to identify potential problems. Should any problems arise concerning this procedure, the patient was given instructions to immediately contact us, at any time, without hesitation. In any case, we plan to contact the patient by telephone for a follow-up status report regarding this interventional procedure.  Comments:  No additional relevant information.   NOTE: The patient  indicates that her left upper quadrant area is not suggesting that the block was effective.  However, she indicates that she still having pain in the area which would suggest that the pain is either viceral, or the injury is more proximal to the spine or within the spine.  Review of the patient's x-rays, CTs, and MRIs revealed no studies looking at this area.  Because of her symptoms and the fact that it is not improving, I believe it to be medically necessary for this patient to undergo a CTA of the thoracic spine to look at the possibility of pathology in that region that could explain the patient's current symptoms.  Plan of Care   Imaging Orders     DG C-Arm 1-60 Min-No Report     CT THORACIC SPINE WO CONTRAST  Procedure Orders     INTERCOSTAL NERVE BLOCK  Medications ordered for procedure: Meds ordered this encounter  Medications  . lidocaine (XYLOCAINE) 2 % (with pres) injection 400 mg  . midazolam (VERSED) 5 MG/5ML injection 1-2 mg    Make sure Flumazenil is available in the pyxis when using this medication. If oversedation occurs, administer 0.2 mg IV over 15 sec. If after 45 sec no response, administer 0.2 mg again over 1 min; may repeat at 1 min intervals; not to exceed 4 doses (1 mg)  . fentaNYL (SUBLIMAZE) injection 25-50 mcg    Make sure Narcan is available in the pyxis when using this medication. In the event of respiratory depression (RR< 8/min): Titrate NARCAN (naloxone) in increments of 0.1 to 0.2 mg IV at 2-3 minute intervals, until desired degree of reversal.  . lactated ringers infusion  1,000 mL  . ropivacaine (PF) 2 mg/mL (0.2%) (NAROPIN) injection 9 mL  . dexamethasone (DECADRON) injection 10 mg   Medications administered: We administered lidocaine, midazolam, fentaNYL, lactated ringers, ropivacaine (PF) 2 mg/mL (0.2%), and dexamethasone.  See the medical record for exact dosing, route, and time of administration.  Disposition: Discharge home  Discharge Date & Time: 05/03/2018; 1044 hrs.   Physician-requested Follow-up: Return for post-procedure eval (2 wks), w/ Dr. Dossie Arbour.  Future Appointments  Date Time Provider [place]  05/21/2018  9:15 AM Milinda Pointer, MD [facility] None  05/29/2018  9:30 AM [name], MD [facility]  07/30/2018 12:00 PM [facility] None  08/01/2018 11:00 AM Curt Bears, MD [facility] None   Primary Care Physician: Wenda Low, MD Location: [facility] Note by: Gaspar Cola, MD Date: 05/03/2018; Time: 11:03 AM  Disclaimer:  Medicine is not an exact science. The only guarantee in medicine is that nothing is guaranteed. It is important to note that the decision to proceed with this intervention was based on the information collected from the patient. The Data and conclusions were drawn from the patient's questionnaire, the interview, and the physical examination. Because the information was provided in large part by the patient, it cannot be guaranteed that it has not been purposely or unconsciously manipulated. Every effort has been made to obtain as much relevant data as possible for this evaluation. It is important to note that the conclusions that lead to this procedure are derived in large part from the available data. Always take into account that the treatment will also be dependent on availability of resources and existing treatment guidelines, considered by other Pain Management Practitioners as being common knowledge and practice, at the time of the  intervention. For Medico-Legal purposes, it is also important  to point out that variation in procedural techniques and pharmacological choices are the acceptable norm. The indications, contraindications, technique, and results of the above procedure should only be interpreted and judged by a Board-Certified Interventional Pain Specialist with extensive familiarity and expertise in the same exact procedure and technique.

## 2018-05-08 ENCOUNTER — Telehealth: Payer: Self-pay | Admitting: *Deleted

## 2018-05-14 ENCOUNTER — Ambulatory Visit: Payer: Medicare Other

## 2018-05-17 NOTE — Progress Notes (Deleted)
Patient's Name: [name]  MRN: [id number]  Referring Provider: Wenda Low, MD  DOB: January 30, 1941  PCP: Wenda Low, MD  DOS: 05/21/2018  Note by: Gaspar Cola, MD  Service setting: Ambulatory outpatient  Specialty: Interventional Pain Management  Location: [facility] (AMB) Pain Management Facility    Patient type: Established   Primary Reason(s) for Visit: Encounter for post-procedure evaluation of chronic illness with mild to moderate exacerbation CC: No chief complaint on file.  HPI  [name] is a 77 y.o. year old, female patient, who comes today for a post-procedure evaluation. She has Infected sebaceous cyst; COPD (chronic obstructive pulmonary disease) ([place]); Acute on chronic respiratory failure with hypoxia ([place]); Acute exacerbation of chronic obstructive pulmonary disease (COPD) ([place]); Essential hypertension; Hypercholesterolemia; CKD (chronic kidney disease), stage III ([place]); Heart murmur; DVT (deep venous thrombosis) ([place]); S/P lobectomy of lung; Adenocarcinoma of right lung, stage 1 ([place]); Neuropathic pain syndrome (non-herpetic); Chronic post-thoracotomy pain; Chronic sacroiliac joint pain; Disorder of skeletal system; Other long term (current) drug therapy; Other specified health status; Chronic chest wall pain (Primary Area of Pain); History of uterine cancer; Generalized anxiety disorder; GERD (gastroesophageal reflux disease); Substernal chest pain; Elevated brain natriuretic peptide (BNP) level; Elevated troponin; Embedded tick of upper back excluding scapular region; Non-ST elevation (NSTEMI) myocardial infarction [facility]); Acute systolic heart failure ([place]); Chronic pain syndrome; Cancer related pain; 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); Chronic low back pain (Secondary Area of Pain) (Bilateral); 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); Chronic lower extremity pain (Tertiary Area of Pain) (Bilateral) (R>L); Chronic hip pain (Fourth Area  of Pain) (Bilateral) (R>L); Neurogenic pain; Pharmacologic therapy; Problems influencing health status; Long term prescription benzodiazepine use; Long term prescription opiate use; Chronic rib pain (Primary Area of Pain) (Right); DDD (degenerative disc disease), lumbar; Chronic fracture of pubic ramus, sequela (Right); History of femoral neck fracture, sequela (Left); Lumbar facet arthropathy (Bilateral); Lumbar facet syndrome (Bilateral); Osteoarthritis of facet joint of lumbar spine; Spondylosis without myelopathy or radiculopathy, lumbosacral region; Other specified dorsopathies, sacral and sacrococcygeal region; Left-sided chest wall pain; Rib pain (Left); Injury of peripheral nerves of thorax, sequela; Intercostal neuralgia (Left); Thoracic radiculitis (Bilateral) (L>R); Chronic thoracic back pain (Left); Bilateral renal cysts; and Hiatal hernia on their problem list. Her primarily concern today is the No chief complaint on file.  Pain Assessment: Location:     Radiating:   Onset:   Duration:   Quality:   Severity:  /10 (subjective, self-reported pain score)  Note: Reported level is compatible with observation.                         When using our objective Pain Scale, levels between 6 and 10/10 are said to belong in an emergency room, as it progressively worsens from a 6/10, described as severely limiting, requiring emergency care not usually available at an outpatient pain management facility. At a 6/10 level, communication becomes difficult and requires great effort. Assistance to reach the emergency department may be required. Facial flushing and profuse sweating along with potentially dangerous increases in heart rate and blood pressure will be evident. Effect on ADL:   Timing:   Modifying factors:   BP:    HR:    [name] comes in today for post-procedure evaluation.  Further details on both, my assessment(s), as well as the proposed treatment plan, please see  below.  Post-Procedure Assessment  05/03/2018 Procedure: Diagnostic Left Intercostal NB #2(T7, T8, T9, T10)under  fluoro and IV sedadtion. Pre-procedure pain score:  5/10 Post-procedure pain score: 5/10 No relief.  The area covered by the T7-T10 intercostal nerves was found to be blocked suggesting that the injection accomplished what it intended to do.  However, on the day of the procedure in question, the patient indicated still having pain, which would suggest that the pain is not in the abdominal wall.  This could then suggest that we are dealing with visceral pain vs pain that is not covered (innervated) by the T7-T10 intercostal nerves. Influential Factors: BMI:   Intra-procedural challenges: None observed.         Assessment challenges: None detected.              Reported side-effects: None.        Post-procedural adverse reactions or complications: None reported         Sedation: Please see nurses note. When no sedatives are used, the analgesic levels obtained are directly associated to the effectiveness of the local anesthetics. However, when sedation is provided, the level of analgesia obtained during the initial 1 hour following the intervention, is believed to be the result of a combination of factors. These factors may include, but are not limited to: 1. The effectiveness of the local anesthetics used. 2. The effects of the analgesic(s) and/or anxiolytic(s) used. 3. The degree of discomfort experienced by the patient at the time of the procedure. 4. The patients ability and reliability in recalling and recording the events. 5. The presence and influence of possible secondary gains and/or psychosocial factors. Reported result: Relief experienced during the 1st hour after the procedure:   (Ultra-Short Term Relief)            Interpretative annotation: Clinically appropriate result. Analgesia during this period is likely to be Local Anesthetic and/or IV Sedative (Analgesic/Anxiolytic)  related.          Effects of local anesthetic: The analgesic effects attained during this period are directly associated to the localized infiltration of local anesthetics and therefore cary significant diagnostic value as to the etiological location, or anatomical origin, of the pain. Expected duration of relief is directly dependent on the pharmacodynamics of the local anesthetic used. Long-acting (4-6 hours) anesthetics used.  Reported result: Relief during the next 4 to 6 hour after the procedure:   (Short-Term Relief)            Interpretative annotation: Clinically appropriate result. Analgesia during this period is likely to be Local Anesthetic-related.          Long-term benefit: Defined as the period of time past the expected duration of local anesthetics (1 hour for short-acting and 4-6 hours for long-acting). With the possible exception of prolonged sympathetic blockade from the local anesthetics, benefits during this period are typically attributed to, or associated with, other factors such as analgesic sensory neuropraxia, antiinflammatory effects, or beneficial biochemical changes provided by agents other than the local anesthetics.  Reported result: Extended relief following procedure:   (Long-Term Relief)            Interpretative annotation: Clinically possible results. Good relief. No permanent benefit expected. Inflammation plays a part in the etiology to the pain.          Current benefits: Defined as reported results that persistent at this point in time.   Analgesia: *** %            Function: Somewhat improved ROM: Somewhat improved Interpretative annotation: Recurrence of symptoms. No permanent benefit expected.  Effective diagnostic intervention.          Interpretation: Results would suggest a successful diagnostic intervention.                  Plan:  Re-assessment of algesic etiology.                Laboratory Chemistry  Inflammation Markers (CRP: Acute Phase) (ESR:  Chronic Phase) Lab Results  Component Value Date   LATICACIDVEN 1.6 01/31/2010                         Rheumatology Markers No results found.  Renal Markers Lab Results  Component Value Date   BUN 20 03/26/2018   CREATININE 1.38 (H) 03/26/2018   BCR 7 (L) 01/17/2018   GFRAA 42 (L) 03/26/2018   GFRNONAA 37 (L) 03/26/2018                             Hepatic Markers Lab Results  Component Value Date   AST 17 01/26/2018   ALT 11 01/26/2018   ALBUMIN 3.8 01/26/2018                        Neuropathy Markers Lab Results  Component Value Date   HGBA1C 5.3 08/29/2015                        Hematology Parameters Lab Results  Component Value Date   INR 1.00 12/09/2017   LABPROT 13.1 12/09/2017   APTT 30 05/05/2016   PLT 226 03/26/2018   HGB 12.4 03/26/2018   HCT 39.1 03/26/2018                        CV Markers Lab Results  Component Value Date   BNP 116.2 (H) 12/08/2017   TROPONINI 0.23 ([place]) 12/08/2017                         Note: Lab results reviewed.  Recent Imaging Results   Results for orders placed in visit on 05/03/18  DG C-Arm 1-60 Min-No Report   Narrative Fluoroscopy was utilized by the requesting physician.  No radiographic  interpretation.    Interpretation Report: Fluoroscopy was used during the procedure to assist with needle guidance. The images were interpreted intraoperatively by the requesting physician.  Meds   Current Outpatient Medications:  .  albuterol (PROVENTIL HFA;VENTOLIN HFA) 108 (90 BASE) MCG/ACT inhaler, Inhale 2 puffs into the lungs every 6 (six) hours as needed. (Patient taking differently: Inhale 2 puffs into the lungs every 4 (four) hours as needed for wheezing or shortness of breath. ), Disp: 18 g, Rfl: 1 .  albuterol (PROVENTIL) (2.5 MG/3ML) 0.083% nebulizer solution, Take 3 mLs (2.5 mg total) by nebulization every 2 (two) hours as needed for wheezing or shortness of breath., Disp: 75 mL, Rfl: 12 .  aluminum-magnesium  hydroxide-simethicone (MAALOX) 443-154-00 MG/5ML SUSP, Take 30 mLs by mouth as needed (indigestion)., Disp: , Rfl:  .  amLODipine (NORVASC) 5 MG tablet, Take 5 mg by mouth daily., Disp: , Rfl:  .  atorvastatin (LIPITOR) 20 MG tablet, Take 20 mg by mouth daily. , Disp: , Rfl:  .  diazepam (VALIUM) 5 MG tablet, Take 2.5 mg by mouth every 8 (eight) hours as needed for anxiety (for nerves). Must last 30  days. Filled 11-28-17, Disp: , Rfl:  .  Fluticasone-Salmeterol (ADVAIR DISKUS) 250-50 MCG/DOSE AEPB, Inhale 1 puff into the lungs every 12 (twelve) hours., Disp: 60 each, Rfl: 0 .  gabapentin (NEURONTIN) 400 MG capsule, Take 1 capsule (400 mg total) by mouth 3 (three) times daily., Disp: 90 capsule, Rfl: 2 .  Glucosamine HCl (GLUCOSAMINE PO), Take 1 tablet by mouth daily., Disp: , Rfl:  .  lisinopril (PRINIVIL,ZESTRIL) 10 MG tablet, Take 1 tablet (10 mg total) by mouth every morning., Disp: 30 tablet, Rfl: 0 .  Magnesium 250 MG TABS, Take 250 mg by mouth daily., Disp: , Rfl:  .  meloxicam (MOBIC) 7.5 MG tablet, Take 7.5 mg by mouth daily., Disp: , Rfl:  .  Menthol, Topical Analgesic, (BERRI-FREEZ PAIN RELIEVING) 10 % LIQD, Apply 1 application topically as needed (pain)., Disp: , Rfl:  .  metoCLOPramide (REGLAN) 5 MG tablet, Take 5 mg by mouth 2 (two) times daily. , Disp: , Rfl:  .  metoprolol tartrate (LOPRESSOR) 25 MG tablet, Take 0.5 tablets (12.5 mg total) by mouth 2 (two) times daily., Disp: 60 tablet, Rfl: 0 .  Multiple Vitamins-Minerals (WOMENS 50+ ADVANCED PO), Take 1 tablet by mouth daily., Disp: , Rfl:  .  omega-3 acid ethyl esters (LOVAZA) 1 g capsule, Take by mouth 2 (two) times daily., Disp: , Rfl:  .  pantoprazole (PROTONIX) 40 MG tablet, Take 1 tablet (40 mg total) by mouth daily at 6 (six) AM., Disp: 30 tablet, Rfl: 0 .  raloxifene (EVISTA) 60 MG tablet, Take 60 mg by mouth daily., Disp: , Rfl:  .  sertraline (ZOLOFT) 100 MG tablet, Take 100 mg by mouth daily., Disp: , Rfl:  .  tiotropium  (SPIRIVA) 18 MCG inhalation capsule, Place 18 mcg into inhaler and inhale daily.  , Disp: , Rfl:  .  traMADol (ULTRAM) 50 MG tablet, Take 50 mg by mouth 3 (three) times daily as needed., Disp: , Rfl:   ROS  Constitutional: Denies any fever or chills Gastrointestinal: No reported hemesis, hematochezia, vomiting, or acute GI distress Musculoskeletal: Denies any acute onset joint swelling, redness, loss of ROM, or weakness Neurological: No reported episodes of acute onset apraxia, aphasia, dysarthria, agnosia, amnesia, paralysis, loss of coordination, or loss of consciousness  Allergies  Ms. Zakrzewski is allergic to boniva [ibandronate sodium] and lyrica [pregabalin].  [place]  Drug: Ms. Minervini  reports that she does not use drugs. Alcohol:  reports that she does not drink alcohol. Tobacco:  reports that she quit smoking about 6 years ago. Her smoking use included cigarettes. She has a 102.00 pack-year smoking history. She has never used smokeless tobacco. Medical:  has a past medical history of Adenocarcinoma of right lung, stage 1 ([place]) (07/28/2016), Anxiety, Cancer ([place]), COPD (chronic obstructive pulmonary disease) ([place]), Cyst, Depression, GERD (gastroesophageal reflux disease), Heart murmur, Hyperlipidemia, Hypertension, Low back pain, Lumbar back pain, Osteoarthritis, and Osteoporosis. Surgical: Ms. Pine  has a past surgical history that includes Cholecystectomy; Appendectomy; Total abdominal hysterectomy; Back surgery; Knee surgery; Hip surgery (01/2010); Mass excision (08/25/2011); Ankle surgery (Right); Fracture surgery; Inner ear surgery (Bilateral, 1970); Eye surgery; Video assisted thoracoscopy (vats)/wedge resection (Right, 05/09/2016); Lobectomy (Right, 05/09/2016); and LEFT HEART CATH AND CORONARY ANGIOGRAPHY (N/A, 12/11/2017). Family: family history includes Cancer in her sister; Heart disease in her brother, father, and mother.  Constitutional Exam  General appearance: Well nourished,  well developed, and well hydrated. In no apparent acute distress There were no vitals filed for this visit. BMI Assessment:  Estimated body mass index is 27.98 kg/m as calculated from the following:   Height as of 05/03/18: _0  (1.575 m).   Weight as of 05/03/18: 153 lb (69.4 kg).  BMI interpretation table: BMI level Category Range association with higher incidence of chronic pain  <18 kg/m2 Underweight   18.5-24.9 kg/m2 Ideal body weight   25-29.9 kg/m2 Overweight Increased incidence by 20%  30-34.9 kg/m2 Obese (Class I) Increased incidence by 68%  35-39.9 kg/m2 Severe obesity (Class II) Increased incidence by 136%  >40 kg/m2 Extreme obesity (Class III) Increased incidence by 254%   Patient's current BMI Ideal Body weight  There is no height or weight on file to calculate BMI. Patient weight not recorded   BMI Readings from Last 4 Encounters:  05/03/18 27.98 kg/m  04/11/18 25.79 kg/m  03/26/18 28.53 kg/m  03/22/18 29.08 kg/m   Wt Readings from Last 4 Encounters:  05/03/18 153 lb (69.4 kg)  04/11/18 141 lb (64 kg)  03/26/18 156 lb (70.8 kg)  03/22/18 159 lb (72.1 kg)  Psych/Mental status: Alert, oriented x 3 (person, place, & time)       Eyes: PERLA Respiratory: No evidence of acute respiratory distress  Cervical Spine Area Exam  Skin & Axial Inspection: No masses, redness, edema, swelling, or associated skin lesions Alignment: Symmetrical Functional ROM: Unrestricted ROM      Stability: No instability detected Muscle Tone/Strength: Functionally intact. No obvious neuro-muscular anomalies detected. Sensory (Neurological): Unimpaired Palpation: No palpable anomalies              Upper Extremity (UE) Exam    Side: Right upper extremity  Side: Left upper extremity  Skin & Extremity Inspection: Skin color, temperature, and hair growth are WNL. No peripheral edema or cyanosis. No masses, redness, swelling, asymmetry, or associated skin lesions. No contractures.  Skin &  Extremity Inspection: Skin color, temperature, and hair growth are WNL. No peripheral edema or cyanosis. No masses, redness, swelling, asymmetry, or associated skin lesions. No contractures.  Functional ROM: Unrestricted ROM          Functional ROM: Unrestricted ROM          Muscle Tone/Strength: Functionally intact. No obvious neuro-muscular anomalies detected.  Muscle Tone/Strength: Functionally intact. No obvious neuro-muscular anomalies detected.  Sensory (Neurological): Unimpaired          Sensory (Neurological): Unimpaired          Palpation: No palpable anomalies              Palpation: No palpable anomalies              Provocative Test(s):  Phalen's test: deferred Tinel's test: deferred Apley's scratch test (touch opposite shoulder):  Action 1 (Across chest): deferred Action 2 (Overhead): deferred Action 3 (LB reach): deferred   Provocative Test(s):  Phalen's test: deferred Tinel's test: deferred Apley's scratch test (touch opposite shoulder):  Action 1 (Across chest): deferred Action 2 (Overhead): deferred Action 3 (LB reach): deferred    Thoracic Spine Area Exam  Skin & Axial Inspection: No masses, redness, or swelling Alignment: Symmetrical Functional ROM: Unrestricted ROM Stability: No instability detected Muscle Tone/Strength: Functionally intact. No obvious neuro-muscular anomalies detected. Sensory (Neurological): Unimpaired Muscle strength & Tone: No palpable anomalies  Lumbar Spine Area Exam  Skin & Axial Inspection: No masses, redness, or swelling Alignment: Symmetrical Functional ROM: Unrestricted ROM       Stability: No instability detected Muscle Tone/Strength: Functionally intact. No obvious neuro-muscular anomalies detected. Sensory (Neurological): Unimpaired Palpation:  No palpable anomalies       Provocative Tests: Hyperextension/rotation test: deferred today       Lumbar quadrant test (Kemp's test): deferred today       Lateral bending test: deferred  today       Patrick's Maneuver: deferred today                   FABER test: deferred today                   S-I anterior distraction/compression test: deferred today         S-I lateral compression test: deferred today         S-I Thigh-thrust test: deferred today         S-I Gaenslen's test: deferred today          Gait & Posture Assessment  Ambulation: Unassisted Gait: Relatively normal for age and body habitus Posture: WNL   Lower Extremity Exam    Side: Right lower extremity  Side: Left lower extremity  Stability: No instability observed          Stability: No instability observed          Skin & Extremity Inspection: Skin color, temperature, and hair growth are WNL. No peripheral edema or cyanosis. No masses, redness, swelling, asymmetry, or associated skin lesions. No contractures.  Skin & Extremity Inspection: Skin color, temperature, and hair growth are WNL. No peripheral edema or cyanosis. No masses, redness, swelling, asymmetry, or associated skin lesions. No contractures.  Functional ROM: Unrestricted ROM                  Functional ROM: Unrestricted ROM                  Muscle Tone/Strength: Functionally intact. No obvious neuro-muscular anomalies detected.  Muscle Tone/Strength: Functionally intact. No obvious neuro-muscular anomalies detected.  Sensory (Neurological): Unimpaired  Sensory (Neurological): Unimpaired  Palpation: No palpable anomalies  Palpation: No palpable anomalies   Assessment  Primary Diagnosis & Pertinent Problem List: The primary encounter diagnosis was Chronic chest wall pain (Primary Area of Pain). 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  Status Diagnosis  Controlled Controlled Controlled 1. Chronic chest wall pain (Primary Area of Pain)   2. 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 Chronic rib pain (Primary Area of Pain) (Right)   4. Chronic low back pain (Secondary Area of Pain) (Bilateral)   5. Chronic lower extremity pain (Tertiary Area of Pain) (Bilateral) (R>L)   6. Chronic hip pain (Fourth Area of Pain) (Bilateral) (R>L)   7. Chronic post-thoracotomy pain   8. Chronic pain syndrome   9. Pharmacologic therapy   10. Disorder of skeletal system   11. Problems influencing health status   12. Hiatal hernia   13. CKD (chronic kidney disease), stage III ([place])   14. Bilateral renal cysts   15. History of uterine cancer     Problems updated and reviewed during this visit: Problem  Bilateral  Renal Cysts  Hiatal Hernia   Plan of Care  Pharmacotherapy (Medications Ordered): No orders of the defined types were placed in this encounter.  Medications administered today: [name] had no medications administered during this visit.  Procedure Orders    No procedure(s) ordered today   Lab Orders  No laboratory test(s) ordered today   Imaging Orders  No imaging studies ordered today   Referral Orders  No referral(s) requested today   Interventional management options: Planned, scheduled, and/or pending:   ***   Considering:   Diagnostic right-sided intercostal nerve blocks Possible right-sided intercostal nerve RFA Diagnostic right-sided thoracic transforaminal ESI vs. Selective nerve root blocks Diagnostic bilateral lumbar facet block Possible bilateral lumbar facet RFA Diagnostic bilateral sacroiliac joint block Possible  bilateral sacroiliac joint RFA Diagnostic right-sided L4-5 interlaminar ESI Diagnostic bilateral L4-5 vs L5-S1transforaminal ESI Diagnostic(Midline)caudal ESI + diagnostic epidurogram Diagnostic bilateral intra-articular hip joint injection Diagnostic bilateral femoral nerve +obturatornerve block Possible bilateral femoral nerve +obturatornerve RFA Possible Racz procedure Possible spinal cord stimulator trial   Palliative PRN treatment(s):   None at this time   Provider-requested follow-up: No follow-ups on file.  Future Appointments  Date Time Provider [place]  05/21/2018  9:15 AM Milinda Pointer, MD [facility] None  05/21/2018  3:30 PM [facility]  05/29/2018  9:30 AM [name], MD [facility]  07/30/2018 12:00 PM [facility] 1 [facility] None  08/01/2018 11:00 AM Curt Bears, MD [facility] None   Primary Care Physician: Wenda Low, MD Location: [facility] Management Facility Note by: Gaspar Cola, MD Date: 05/21/2018; Time: 5:36 AM

## 2018-05-21 ENCOUNTER — Ambulatory Visit: Payer: Medicare Other | Admitting: Pain Medicine

## 2018-05-21 ENCOUNTER — Ambulatory Visit
Admission: RE | Admit: 2018-05-21 | Discharge: 2018-05-21 | Disposition: A | Discharge status: Home / Self Care | Payer: Medicare Other | Source: Ambulatory Visit | Attending: Pain Medicine | Admitting: Pain Medicine

## 2018-05-21 DIAGNOSIS — M5414 Radiculopathy, thoracic region: Secondary | ICD-10-CM | POA: Insufficient documentation

## 2018-05-21 DIAGNOSIS — G8929 Other chronic pain: Secondary | ICD-10-CM | POA: Diagnosis not present

## 2018-05-21 DIAGNOSIS — K449 Diaphragmatic hernia without obstruction or gangrene: Secondary | ICD-10-CM | POA: Insufficient documentation

## 2018-05-21 DIAGNOSIS — M546 Pain in thoracic spine: Secondary | ICD-10-CM

## 2018-05-21 DIAGNOSIS — N281 Cyst of kidney, acquired: Secondary | ICD-10-CM | POA: Insufficient documentation

## 2018-05-21 IMAGING — CT CT T SPINE W/O CM
3 of 4 series · 9 of 33 positions shown, 10 images · non-contrast
Comparison: Chest two-view [DATE], CT chest [DATE]

CLINICAL DATA: Thoracic radiculitis. Left-sided thoracic pain. Lung
cancer

EXAM:
CT THORACIC SPINE WITHOUT CONTRAST
TECHNIQUE: Multidetector CT images of the thoracic were obtained using the
standard protocol without intravenous contrast.

[Series 4: t-spine · axial · 0.23mm/px · z∈[-1043,-1043]mm · 1 of 162 slices shown, 2 images (1 of 3)]
[im 81/162  soft-tissue]
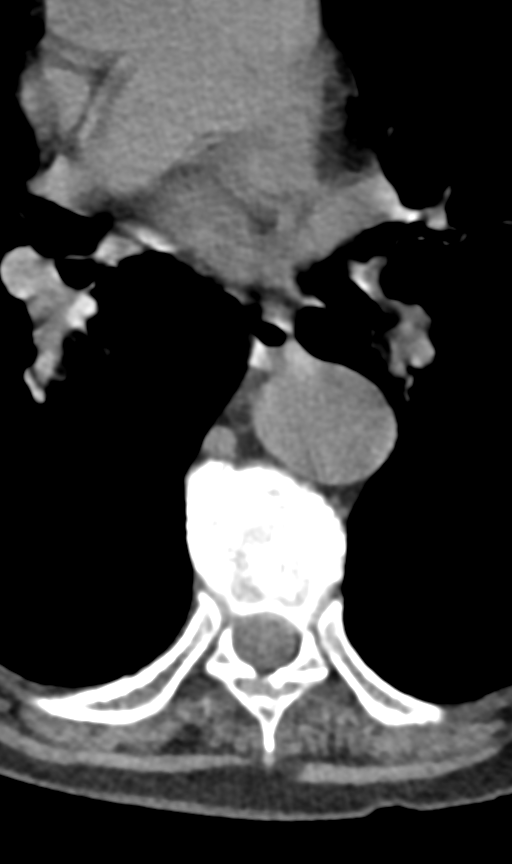
[im 81/162  bone]
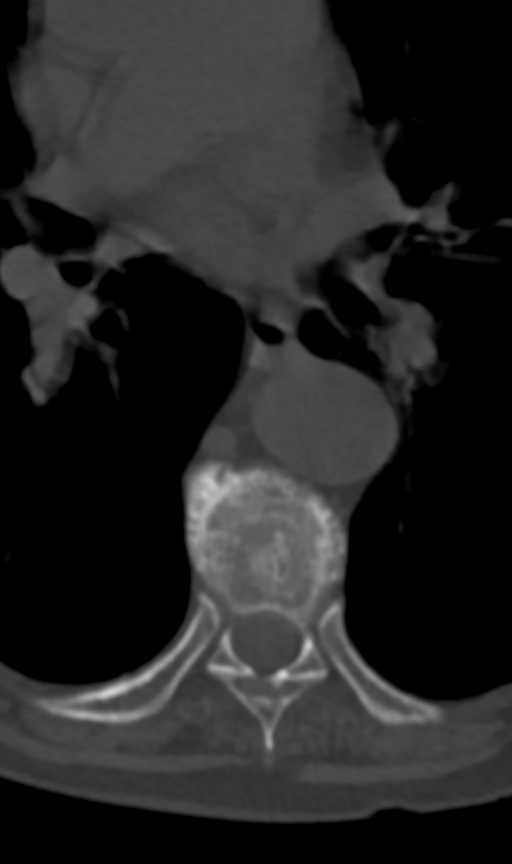

[Series 8: t-spine · sagittal · 0.40mm/px · 5 of 60 slices shown (2 of 3)]
[im 20/60  bone]
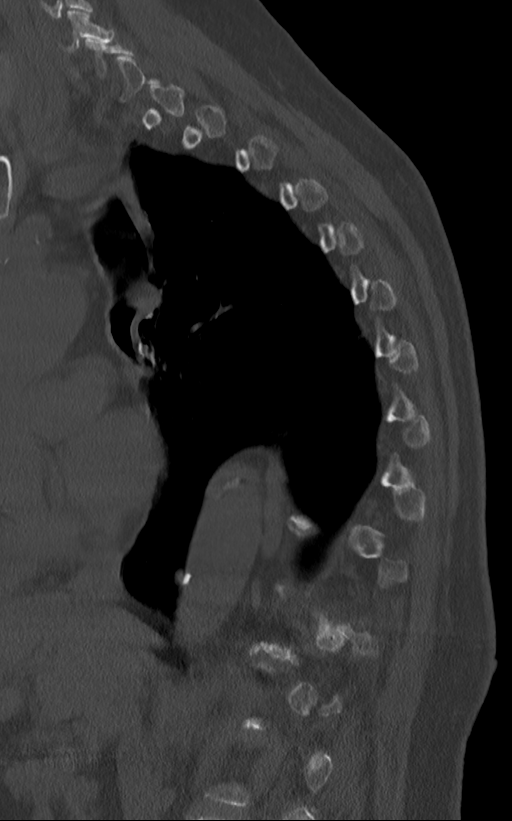
[im 25/60  bone]
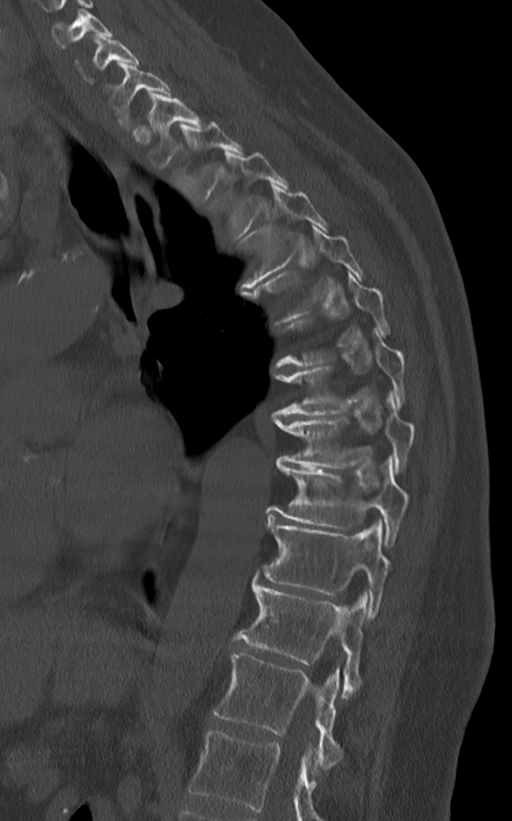
[im 30/60  bone]
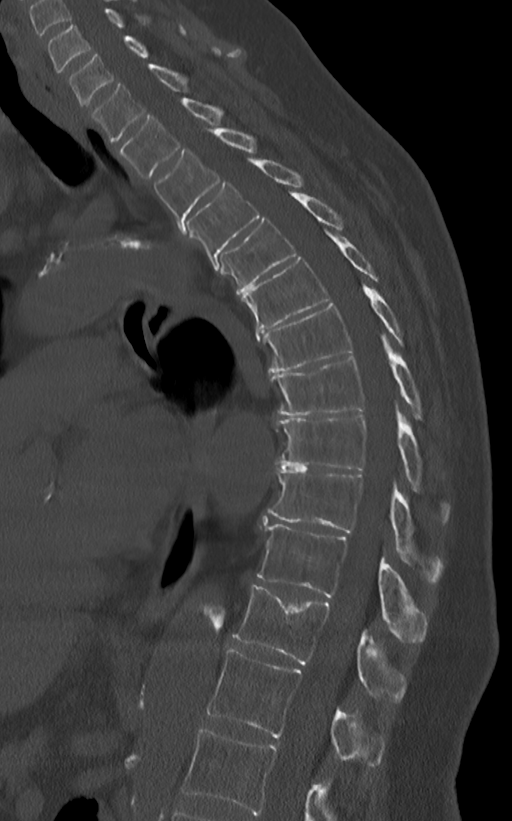
[im 35/60  bone]
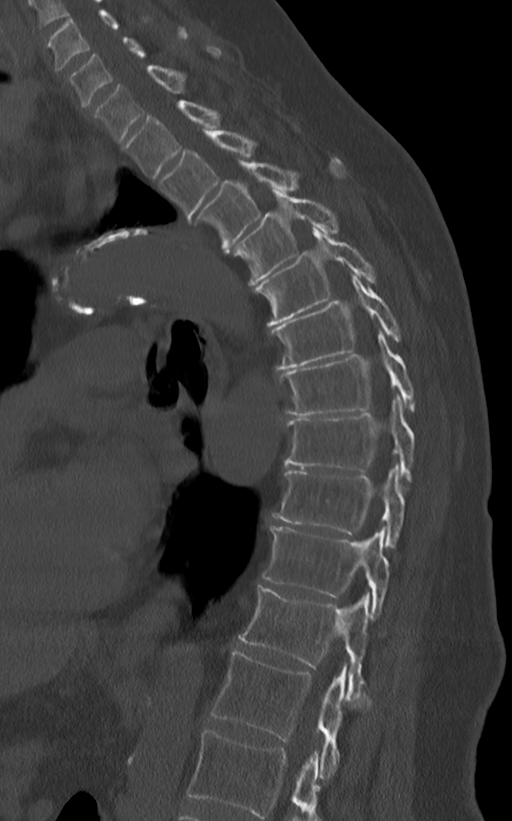
[im 40/60  bone]
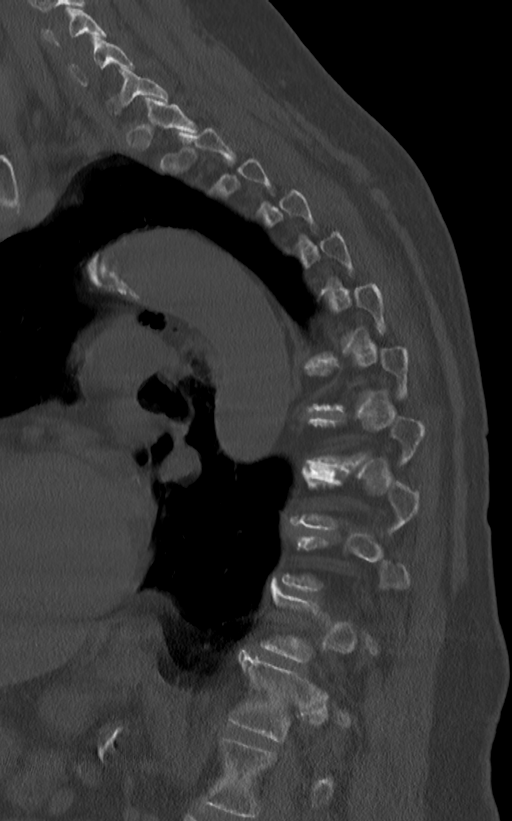

[Series 10: t-spine · coronal · 0.23mm/px · 3 of 101 slices shown (3 of 3)]
[im 21/101  bone]
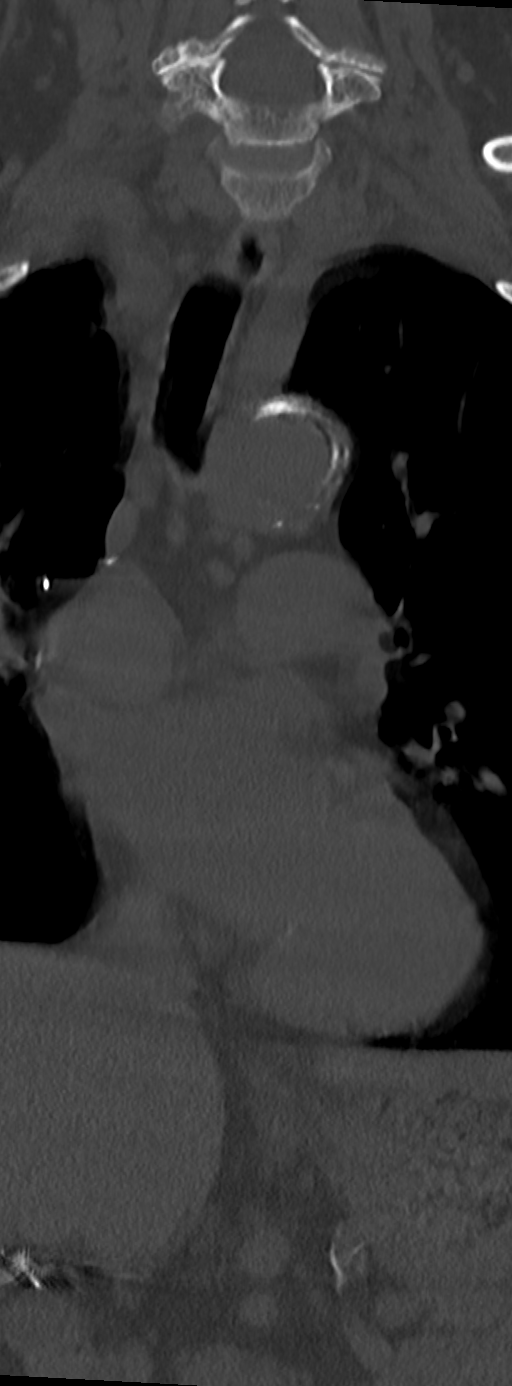
[im 41/101  bone]
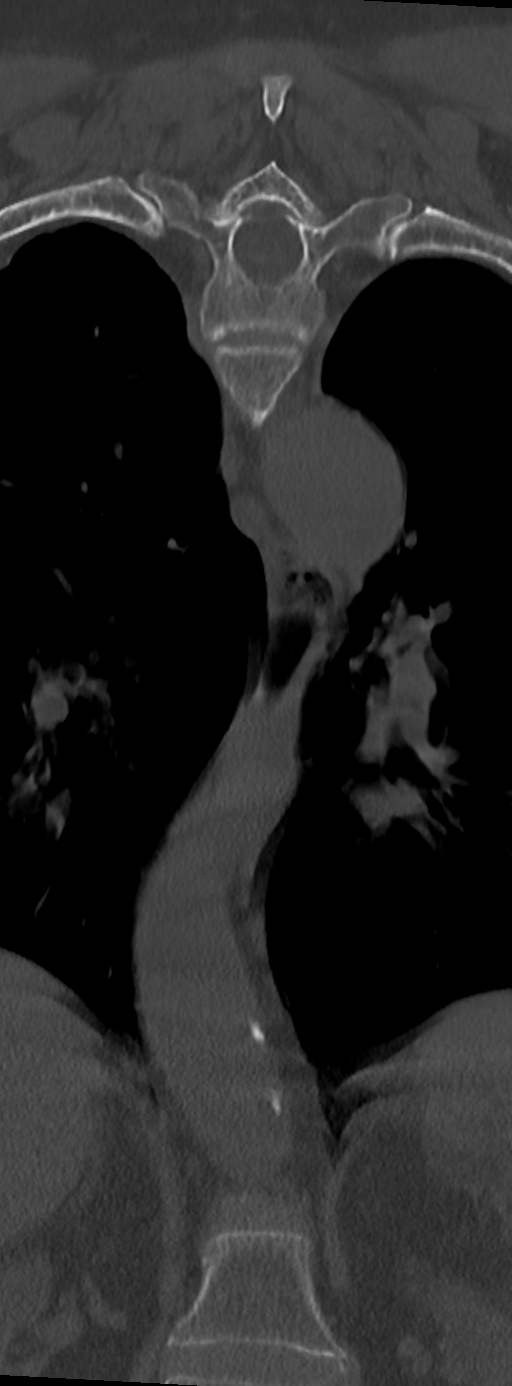
[im 61/101  bone]
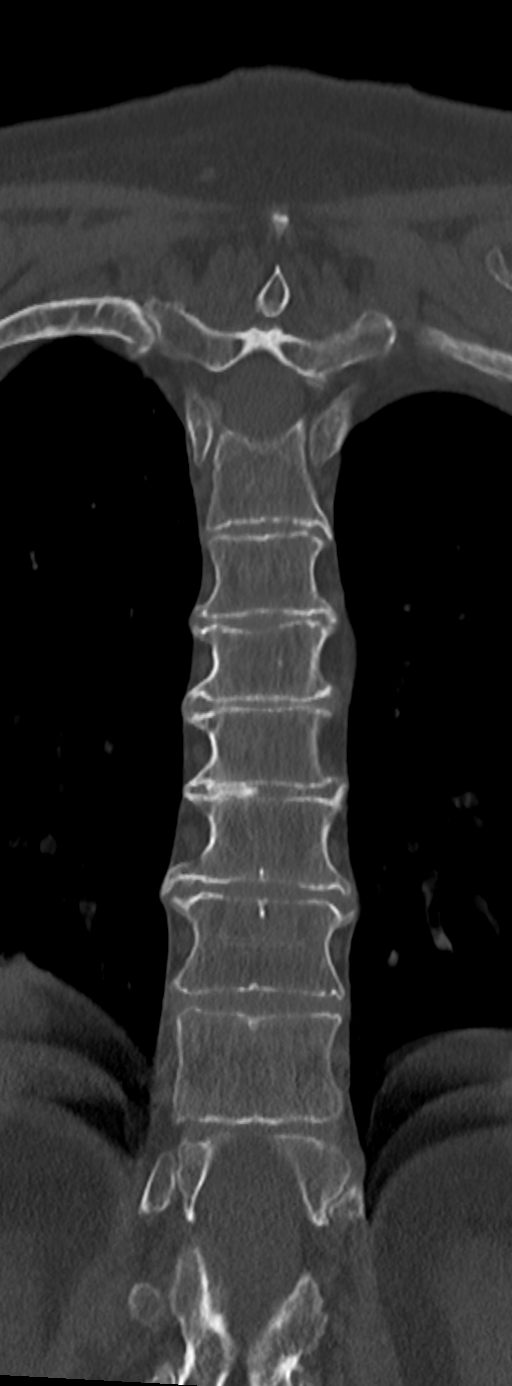

[9 of 33 positions shown; findings below may reference images not displayed]

FINDINGS: Alignment: Normal alignment.  Moderate thoracic kyphosis unchanged.

Vertebrae: Negative for fracture. No lytic or sclerotic bone lesion.

Paraspinal and other soft tissues: Atherosclerotic calcification in
the aorta without aneurysm. No paraspinous mass. Apical emphysema.

Disc levels: Negative for spinal stenosis. Multilevel disc
degeneration. Multiple Schmorl's nodes are present throughout the
mid and lower thoracic spine. Disc protrusions are best evaluated by
MRI.
IMPRESSION: Negative for fracture or mass in the thoracic spine. No significant
spinal stenosis.

## 2018-05-24 ENCOUNTER — Encounter: Payer: Self-pay | Admitting: Nurse Practitioner

## 2018-05-24 ENCOUNTER — Ambulatory Visit: Payer: Medicare Other | Attending: Pain Medicine | Admitting: Nurse Practitioner

## 2018-05-24 ENCOUNTER — Other Ambulatory Visit: Payer: Self-pay

## 2018-05-24 VITALS — BP 102/91 | HR 70 | Temp 97.5°F | Resp 18 | Ht 62.0 in | Wt 154.1 lb

## 2018-05-24 DIAGNOSIS — M5414 Radiculopathy, thoracic region: Secondary | ICD-10-CM

## 2018-05-24 DIAGNOSIS — R0789 Other chest pain: Secondary | ICD-10-CM

## 2018-05-24 DIAGNOSIS — M47817 Spondylosis without myelopathy or radiculopathy, lumbosacral region: Secondary | ICD-10-CM | POA: Diagnosis not present

## 2018-05-24 DIAGNOSIS — G8929 Other chronic pain: Secondary | ICD-10-CM

## 2018-05-24 DIAGNOSIS — M792 Neuralgia and neuritis, unspecified: Secondary | ICD-10-CM | POA: Diagnosis not present

## 2018-05-24 DIAGNOSIS — G894 Chronic pain syndrome: Secondary | ICD-10-CM

## 2018-05-24 MED ORDER — NONFORMULARY OR COMPOUNDED ITEM: 2 refills | Status: AC

## 2018-05-24 MED ORDER — GABAPENTIN 400 MG PO CAPS: 400.0000 mg | ORAL_CAPSULE | Freq: Four times a day (QID) | ORAL | 2 refills | Status: DC

## 2018-05-24 NOTE — Progress Notes (Signed)
Safety precautions to be maintained throughout the outpatient stay will include: orient to surroundings, keep bed in low position, maintain call bell within reach at all times, provide assistance with transfer out of bed and ambulation.  

## 2018-05-24 NOTE — Patient Instructions (Signed)

## 2018-05-24 NOTE — Progress Notes (Signed)
Patient's Name: [name]  MRN: [id number]  Referring Provider: Wenda Low, MD  DOB: 07-13-1941  PCP: Wenda Low, MD  DOS: 05/24/2018  Note by: Vevelyn Francois NP  Service setting: Ambulatory outpatient  Specialty: Interventional Pain Management  Location: [facility] (AMB) Pain Management Facility    Patient type: Established    Primary Reason(s) for Visit: Encounter for prescription drug management & post-procedure evaluation of chronic illness with mild to moderate exacerbation(Level of risk: moderate) CC: Back Pain (low) and Pain (rib area bilaterally)  HPI  [name] is a 77 y.o. year old, female patient, who comes today for a post-procedure evaluation and medication management. She has Infected sebaceous cyst; COPD (chronic obstructive pulmonary disease) ([place]); Acute on chronic respiratory failure with hypoxia ([place]); Acute exacerbation of chronic obstructive pulmonary disease (COPD) ([place]); Essential hypertension; Hypercholesterolemia; CKD (chronic kidney disease), stage III ([place]); Heart murmur; DVT (deep venous thrombosis) ([place]); S/P lobectomy of lung; Adenocarcinoma of right lung, stage 1 ([place]); Neuropathic pain syndrome (non-herpetic); Chronic post-thoracotomy pain; Chronic sacroiliac joint pain; Disorder of skeletal system; Other long term (current) drug therapy; Other specified health status; Chronic chest wall pain (Primary Area of Pain); History of uterine cancer; Generalized anxiety disorder; GERD (gastroesophageal reflux disease); Substernal chest pain; Elevated brain natriuretic peptide (BNP) level; Elevated troponin; Embedded tick of upper back excluding scapular region; Non-ST elevation (NSTEMI) myocardial infarction [facility]); Acute systolic heart failure ([place]); Chronic pain syndrome; Cancer related pain; 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); Chronic low back pain (Secondary Area of Pain) (Bilateral); 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);  Chronic lower extremity pain (Tertiary Area of Pain) (Bilateral) (R>L); Chronic hip pain (Fourth Area of Pain) (Bilateral) (R>L); Neurogenic pain; Pharmacologic therapy; Problems influencing health status; Long term prescription benzodiazepine use; Long term prescription opiate use; Chronic rib pain (Primary Area of Pain) (Right); DDD (degenerative disc disease), lumbar; Chronic fracture of pubic ramus, sequela (Right); History of femoral neck fracture, sequela (Left); Lumbar facet arthropathy (Bilateral); Lumbar facet syndrome (Bilateral); Osteoarthritis of facet joint of lumbar spine; Spondylosis without myelopathy or radiculopathy, lumbosacral region; Other specified dorsopathies, sacral and sacrococcygeal region; Left-sided chest wall pain; Rib pain (Left); Injury of peripheral nerves of thorax, sequela; Intercostal neuralgia (Left); Thoracic radiculitis (Bilateral) (L>R); Chronic thoracic back pain (Left); Bilateral renal cysts; and Hiatal hernia on their problem list. Her primarily concern today is the Back Pain (low) and Pain (rib area bilaterally)  Pain Assessment: Location: Right, Left Rib cage Radiating: mid and low back Onset: More than a month ago Duration: Chronic pain Quality: Throbbing, Burning, Aching, Stabbing, Constant Severity: 6 /10 (subjective, self-reported pain score)  Note: Reported level is compatible with observation. Clinically the patient looks like a 2/10 A 2/10 is viewed as "Mild to Moderate" and described as noticeable and distracting. Impossible to hide from other people. More frequent flare-ups. Still possible to adapt and function close to normal. It can be very annoying and may have occasional stronger flare-ups. With discipline, patients may get used to it and adapt. Information on the proper use of the pain scale provided to the patient today. When using our objective Pain Scale, levels between 6 and 10/10 are said to belong in an emergency room, as it progressively  worsens from a 6/10, described as severely limiting, requiring emergency care not usually available at an outpatient pain management facility. At a 6/10 level, communication becomes difficult and requires great effort. Assistance to reach the emergency department may be required. Facial flushing and  profuse sweating along with potentially dangerous increases in heart rate and blood pressure will be evident. Timing: Constant Modifying factors: rest BP: (!) 102/91  HR: 70  [name] was last seen on 05/08/2018 for a procedure. During today's appointment we reviewed [name]'s post-procedure results, as well as her outpatient medication regimen.  Further details on both, my assessment(s), as well as the proposed treatment plan, please see below.  Post-Procedure Assessment  05/03/2018 procedure: Left sided intercostal nerve block Pre-procedure pain score:  5/10 Post-procedure pain score: 5/10 (100% relief) area was numb. Influential Factors: BMI: 28.19 kg/m Intra-procedural challenges: None observed.         Assessment challenges: None detected.              Reported side-effects: None.        Post-procedural adverse reactions or complications: None reported         Sedation: Please see nurses note. When no sedatives are used, the analgesic levels obtained are directly associated to the effectiveness of the local anesthetics. However, when sedation is provided, the level of analgesia obtained during the initial 1 hour following the intervention, is believed to be the result of a combination of factors. These factors may include, but are not limited to: 1. The effectiveness of the local anesthetics used. 2. The effects of the analgesic(s) and/or anxiolytic(s) used. 3. The degree of discomfort experienced by the patient at the time of the procedure. 4. The patients ability and reliability in recalling and recording the events. 5. The presence and influence of possible secondary gains  and/or psychosocial factors. Reported result: Relief experienced during the 1st hour after the procedure: 100 % (Ultra-Short Term Relief)            Interpretative annotation: Clinically appropriate result. Analgesia during this period is likely to be Local Anesthetic and/or IV Sedative (Analgesic/Anxiolytic) related.          Effects of local anesthetic: The analgesic effects attained during this period are directly associated to the localized infiltration of local anesthetics and therefore cary significant diagnostic value as to the etiological location, or anatomical origin, of the pain. Expected duration of relief is directly dependent on the pharmacodynamics of the local anesthetic used. Long-acting (4-6 hours) anesthetics used.  Reported result: Relief during the next 4 to 6 hour after the procedure: 100 % (Short-Term Relief)            Interpretative annotation: Clinically appropriate result. Analgesia during this period is likely to be Local Anesthetic-related.          Long-term benefit: Defined as the period of time past the expected duration of local anesthetics (1 hour for short-acting and 4-6 hours for long-acting). With the possible exception of prolonged sympathetic blockade from the local anesthetics, benefits during this period are typically attributed to, or associated with, other factors such as analgesic sensory neuropraxia, antiinflammatory effects, or beneficial biochemical changes provided by agents other than the local anesthetics.  Reported result: Extended relief following procedure: 0 % (Long-Term Relief)            Interpretative annotation: Clinically possible results. Good relief. No permanent benefit expected. Inflammation plays a part in the etiology to the pain.          Current benefits: Defined as reported results that persistent at this point in time.   Analgesia: 0 %            Function: Somewhat improved ROM: Somewhat improved Interpretative annotation:  Recurrence  of symptoms. No permanent benefit expected. Effective diagnostic intervention.          Interpretation: Results would suggest a successful diagnostic intervention.                  Plan:  Please see "Plan of Care" for details.                Laboratory Chemistry  Inflammation Markers (CRP: Acute Phase) (ESR: Chronic Phase) Lab Results  Component Value Date   LATICACIDVEN 1.6 01/31/2010                         Rheumatology Markers No results found for: RF, ANA, LABURIC, URICUR, LYMEIGGIGMAB, LYMEABIGMQN, HLAB27                      Renal Function Markers Lab Results  Component Value Date   BUN 20 03/26/2018   CREATININE 1.38 (H) 03/26/2018   BCR 7 (L) 01/17/2018   GFRAA 42 (L) 03/26/2018   GFRNONAA 37 (L) 03/26/2018                             Hepatic Function Markers Lab Results  Component Value Date   AST 17 01/26/2018   ALT 11 01/26/2018   ALBUMIN 3.8 01/26/2018   ALKPHOS 51 01/26/2018   LIPASE 35 01/08/2018                        Electrolytes Lab Results  Component Value Date   NA 142 03/26/2018   K 3.2 (L) 03/26/2018   CL 106 03/26/2018   CALCIUM 8.7 (L) 03/26/2018   MG 2.2 01/17/2018                        Neuropathy Markers Lab Results  Component Value Date   HGBA1C 5.3 08/29/2015                        CNS Tests No results found for: COLORCSF, APPEARCSF, RBCCOUNTCSF, WBCCSF, POLYSCSF, LYMPHSCSF, EOSCSF, PROTEINCSF, GLUCCSF, JCVIRUS, CSFOLI, IGGCSF                      Bone Pathology Markers No results found for: VD25OH, [id number], [id number], [id number], 25OHVITD1, 25OHVITD2, 25OHVITD3, TESTOFREE, TESTOSTERONE                       Coagulation Parameters Lab Results  Component Value Date   INR 1.00 12/09/2017   LABPROT 13.1 12/09/2017   APTT 30 05/05/2016   PLT 226 03/26/2018                        Cardiovascular Markers Lab Results  Component Value Date   BNP 116.2 (H) 12/08/2017   TROPONINI 0.23 (HH) 12/08/2017   HGB 12.4  03/26/2018   HCT 39.1 03/26/2018                         CA Markers No results found for: CEA, CA125, LABCA2                      Note: Lab results reviewed.  Recent Diagnostic Imaging Results  CT THORACIC SPINE WO CONTRAST CLINICAL DATA:  Thoracic radiculitis. Left-sided thoracic pain. Lung  cancer  EXAM: CT THORACIC SPINE WITHOUT CONTRAST  TECHNIQUE: Multidetector CT images of the thoracic were obtained using the standard protocol without intravenous contrast.  COMPARISON:  Chest two-view 03/26/2018, CT chest 01/26/2018  FINDINGS: Alignment: Normal alignment.  Moderate thoracic kyphosis unchanged.  Vertebrae: Negative for fracture. No lytic or sclerotic bone lesion.  Paraspinal and other soft tissues: Atherosclerotic calcification in the aorta without aneurysm. No paraspinous mass. Apical emphysema.  Disc levels: Negative for spinal stenosis. Multilevel disc degeneration. Multiple Schmorl's nodes are present throughout the mid and lower thoracic spine. Disc protrusions are best evaluated by MRI.  IMPRESSION: Negative for fracture or mass in the thoracic spine. No significant spinal stenosis.  Electronically Signed   By: Franchot Gallo M.D.   On: 05/21/2018 19:11  Complexity Note: Imaging results reviewed. Results shared with [name], using Layman's terms.                         Meds   Current Outpatient Medications:  .  albuterol (PROVENTIL HFA;VENTOLIN HFA) 108 (90 BASE) MCG/ACT inhaler, Inhale 2 puffs into the lungs every 6 (six) hours as needed. (Patient taking differently: Inhale 2 puffs into the lungs every 4 (four) hours as needed for wheezing or shortness of breath. ), Disp: 18 g, Rfl: 1 .  albuterol (PROVENTIL) (2.5 MG/3ML) 0.083% nebulizer solution, Take 3 mLs (2.5 mg total) by nebulization every 2 (two) hours as needed for wheezing or shortness of breath., Disp: 75 mL, Rfl: 12 .  aluminum-magnesium hydroxide-simethicone (MAALOX) 270-623-76 MG/5ML  SUSP, Take 30 mLs by mouth as needed (indigestion)., Disp: , Rfl:  .  amLODipine (NORVASC) 5 MG tablet, Take 5 mg by mouth daily., Disp: , Rfl:  .  atorvastatin (LIPITOR) 20 MG tablet, Take 20 mg by mouth daily. , Disp: , Rfl:  .  diazepam (VALIUM) 5 MG tablet, Take 2.5 mg by mouth every 8 (eight) hours as needed for anxiety (for nerves). Must last 30 days. Filled 11-28-17, Disp: , Rfl:  .  Fluticasone-Salmeterol (ADVAIR DISKUS) 250-50 MCG/DOSE AEPB, Inhale 1 puff into the lungs every 12 (twelve) hours., Disp: 60 each, Rfl: 0 .  Glucosamine HCl (GLUCOSAMINE PO), Take 1 tablet by mouth daily., Disp: , Rfl:  .  lisinopril (PRINIVIL,ZESTRIL) 10 MG tablet, Take 1 tablet (10 mg total) by mouth every morning., Disp: 30 tablet, Rfl: 0 .  Magnesium 250 MG TABS, Take 250 mg by mouth daily., Disp: , Rfl:  .  meloxicam (MOBIC) 7.5 MG tablet, Take 7.5 mg by mouth daily., Disp: , Rfl:  .  Menthol, Topical Analgesic, (BERRI-FREEZ PAIN RELIEVING) 10 % LIQD, Apply 1 application topically as needed (pain)., Disp: , Rfl:  .  metoCLOPramide (REGLAN) 5 MG tablet, Take 5 mg by mouth 2 (two) times daily. , Disp: , Rfl:  .  metoprolol tartrate (LOPRESSOR) 25 MG tablet, Take 0.5 tablets (12.5 mg total) by mouth 2 (two) times daily., Disp: 60 tablet, Rfl: 0 .  Multiple Vitamins-Minerals (WOMENS 50+ ADVANCED PO), Take 1 tablet by mouth daily., Disp: , Rfl:  .  omega-3 acid ethyl esters (LOVAZA) 1 g capsule, Take by mouth 2 (two) times daily., Disp: , Rfl:  .  pantoprazole (PROTONIX) 40 MG tablet, Take 1 tablet (40 mg total) by mouth daily at 6 (six) AM., Disp: 30 tablet, Rfl: 0 .  raloxifene (EVISTA) 60 MG tablet, Take 60 mg by mouth daily., Disp: , Rfl:  .  sertraline (ZOLOFT) 100 MG tablet, Take  100 mg by mouth daily., Disp: , Rfl:  .  tiotropium (SPIRIVA) 18 MCG inhalation capsule, Place 18 mcg into inhaler and inhale daily.  , Disp: , Rfl:  .  traMADol (ULTRAM) 50 MG tablet, Take 50 mg by mouth 3 (three) times daily as  needed., Disp: , Rfl:  .  gabapentin (NEURONTIN) 400 MG capsule, Take 1 capsule (400 mg total) by mouth 4 (four) times daily., Disp: 120 capsule, Rfl: 2 .  NONFORMULARY OR COMPOUNDED ITEM, Sig: Apply 1-2 gm(s) (2-4 pumps) to affected area, 3-4 times/day. (1 pump = 0.5 gm), Disp: 1 each, Rfl: 2  ROS  Constitutional: Denies any fever or chills Gastrointestinal: No reported hemesis, hematochezia, vomiting, or acute GI distress Musculoskeletal: Denies any acute onset joint swelling, redness, loss of ROM, or weakness Neurological: No reported episodes of acute onset apraxia, aphasia, dysarthria, agnosia, amnesia, paralysis, loss of coordination, or loss of consciousness  Allergies  [name] is allergic to boniva [ibandronate sodium] and lyrica [pregabalin].  [place]  Drug: [name]  reports that she does not use drugs. Alcohol:  reports that she does not drink alcohol. Tobacco:  reports that she quit smoking about 6 years ago. Her smoking use included cigarettes. She has a 102.00 pack-year smoking history. She has never used smokeless tobacco. Medical:  has a past medical history of Adenocarcinoma of right lung, stage 1 ([place]) (07/28/2016), Anxiety, Cancer ([place]), COPD (chronic obstructive pulmonary disease) ([place]), Cyst, Depression, GERD (gastroesophageal reflux disease), Heart murmur, Hyperlipidemia, Hypertension, Low back pain, Lumbar back pain, Osteoarthritis, and Osteoporosis. Surgical: [name]  has a past surgical history that includes Cholecystectomy; Appendectomy; Total abdominal hysterectomy; Back surgery; Knee surgery; Hip surgery (01/2010); Mass excision (08/25/2011); Ankle surgery (Right); Fracture surgery; Inner ear surgery (Bilateral, 1970); Eye surgery; Video assisted thoracoscopy (vats)/wedge resection (Right, 05/09/2016); Lobectomy (Right, 05/09/2016); and LEFT HEART CATH AND CORONARY ANGIOGRAPHY (N/A, 12/11/2017). Family: family history includes Cancer in her sister; Heart disease  in her brother, father, and mother.  Constitutional Exam  General appearance: Well nourished, well developed, and well hydrated. In no apparent acute distress Vitals:   05/24/18 1022  BP: (!) 102/91  Pulse: 70  Resp: 18  Temp: (!) 97.5 F (36.4 C)  TempSrc: Oral  SpO2: 100%  Weight: 154 lb 1.6 oz (69.9 kg)  Height: _0  (1.575 m)   Psych/Mental status: Alert, oriented x 3 (person, place, & time)       Eyes: PERLA Respiratory: No evidence of acute respiratory distress    Thoracic Spine Area Exam  Skin & Axial Inspection: Well healed scar from previous spine surgery detected to the left Alignment: Symmetrical Functional ROM: Guarding Stability: No instability detected Muscle Tone/Strength: Increased muscle tone over affected area Sensory (Neurological): Hyperalgesia (Increased sensitivity to pain) Muscle strength & Tone: Complains of area being tender to palpation  Lumbar Spine Area Exam  Skin & Axial Inspection: No masses, redness, or swelling Alignment: Asymmetric Functional ROM: Unrestricted ROM       Stability: No instability detected Muscle Tone/Strength: Functionally intact. No obvious neuro-muscular anomalies detected. Sensory (Neurological): Unimpaired Palpation: Complains of area being tender to palpation         Gait & Posture Assessment  Ambulation: Unassisted Gait: Relatively normal for age and body habitus Posture: WNL   Lower Extremity Exam    Side: Right lower extremity  Side: Left lower extremity  Stability: No instability observed          Stability: No instability observed  Skin & Extremity Inspection: Skin color, temperature, and hair growth are WNL. No peripheral edema or cyanosis. No masses, redness, swelling, asymmetry, or associated skin lesions. No contractures.  Skin & Extremity Inspection: Skin color, temperature, and hair growth are WNL. No peripheral edema or cyanosis. No masses, redness, swelling, asymmetry, or associated skin  lesions. No contractures.  Functional ROM: Unrestricted ROM                  Functional ROM: Unrestricted ROM                  Muscle Tone/Strength: Functionally intact. No obvious neuro-muscular anomalies detected.  Muscle Tone/Strength: Functionally intact. No obvious neuro-muscular anomalies detected.  Sensory (Neurological): Unimpaired  Sensory (Neurological): Unimpaired  Palpation: No palpable anomalies  Palpation: No palpable anomalies   Assessment  Primary Diagnosis & Pertinent Problem List: The primary encounter diagnosis was Spondylosis without myelopathy or radiculopathy, lumbosacral region. Diagnoses of Thoracic radiculitis (Bilateral) (L>R), Neurogenic pain, Neuropathic pain syndrome (non-herpetic), Chronic chest wall pain (Primary Area of Pain), and Chronic pain syndrome were also pertinent to this visit.  Status Diagnosis  Persistent Persistent Persistent 1. Spondylosis without myelopathy or radiculopathy, lumbosacral region   2. Thoracic radiculitis (Bilateral) (L>R)   3. Neurogenic pain   4. Neuropathic pain syndrome (non-herpetic)   5. Chronic chest wall pain (Primary Area of Pain)   6. Chronic pain syndrome     Problems updated and reviewed during this visit: Problem  Thoracic radiculitis (Bilateral) (L>R)  Chronic fracture of pubic ramus, sequela (Right)  Cancer Related Pain   Plan of Care  Pharmacotherapy (Medications Ordered): Meds ordered this encounter  Medications  . gabapentin (NEURONTIN) 400 MG capsule    Sig: Take 1 capsule (400 mg total) by mouth 4 (four) times daily.    Dispense:  120 capsule    Refill:  2    Do not place medication on "Automatic Refill". Fill one day early if pharmacy is closed on scheduled refill date.    Order Specific Question:   Supervising Provider    Answer:   Milinda Pointer [phone]  . NONFORMULARY OR COMPOUNDED ITEM    Sig: Sig: Apply 1-2 gm(s) (2-4 pumps) to affected area, 3-4 times/day. (1 pump = 0.5 gm)     Dispense:  1 each    Refill:  2    Compounded cream:5.0% Lidocaine, 2% Gabapentin, 3% Diclofenac, 2% Cyclobenaprine  Dispense: 120 gm Pump Bottle. (Dispenser: 1 pump = 0.5 gm.)    Order Specific Question:   Supervising Provider    Answer:   Milinda Pointer [phone]   New Prescriptions   NONFORMULARY OR COMPOUNDED ITEM    Sig: Apply 1-2 gm(s) (2-4 pumps) to affected area, 3-4 times/day. (1 pump = 0.5 gm)   Medications administered today: [name] had no medications administered during this visit. Lab-work, procedure(s), and/or referral(s): No orders of the defined types were placed in this encounter.  Imaging and/or referral(s): None Interventional management options: Planned, scheduled, and/or pending:   Diagnostic Left Intercostal NB #2(T7, T8, T9, T10)under fluoro and IV sedadtion.   Considering:   Diagnostic right-sided intercostal nerve blocks Possible right-sided intercostal nerve RFA Diagnostic right-sided thoracic transforaminal ESI vs. Selective nerve root blocks Diagnostic bilateral lumbar facet block Possible bilateral lumbar facet RFA Diagnostic bilateral sacroiliac joint block Possible bilateral sacroiliac joint RFA Diagnostic right-sided L4-5 interlaminar ESI Diagnostic bilateral L4-5 vs L5-S1transforaminal ESI Diagnostic(Midline)caudal ESI + diagnostic epidurogram Diagnostic bilateral intra-articular hip joint injection Diagnostic bilateral femoral  nerve +obturatornerve block Possible bilateral femoral nerve +obturatornerve RFA Possible Racz procedure Possible spinal cord stimulator trial   Palliative PRN treatment(s):   None at this time    Provider-requested follow-up: Return in about 3 months (around 08/24/2018) for MedMgmt.  Future Appointments  Date Time Provider [facility]  05/29/2018  9:30 AM [name], MD [facility]  07/30/2018 12:00 PM [facility] 1 [facility] None  08/01/2018 11:00 AM  Curt Bears, MD [facility] None  08/21/2018 11:15 AM Vevelyn Francois, NP [facility] None   Primary Care Physician: Wenda Low, MD Location: [facility] Outpatient Pain Management Facility Note by: Vevelyn Francois NP Date: 05/24/2018; Time: 1:53 PM  Pain Score Disclaimer: We use the NRS-11 scale. This is a self-reported, subjective measurement of pain severity with only modest accuracy. It is used primarily to identify changes within a particular patient. It must be understood that outpatient pain scales are significantly less accurate that those used for research, where they can be applied under ideal controlled circumstances with minimal exposure to variables. In reality, the score is likely to be a combination of pain intensity and pain affect, where pain affect describes the degree of emotional arousal or changes in action readiness caused by the sensory experience of pain. Factors such as social and work situation, setting, emotional state, anxiety levels, expectation, and prior pain experience may influence pain perception and show large inter-individual differences that may also be affected by time variables.  Patient instructions provided during this appointment: Patient Instructions   ____________________________________________________________________________________________  Pain Scale  Introduction: The pain score used by this practice is the Verbal Numerical Rating Scale (VNRS-11). This is an 11-point scale. It is for adults and children 10 years or older. There are significant differences in how the pain score is reported, used, and applied. Forget everything you learned in the past and learn this scoring system.  General Information: The scale should reflect your current level of pain. Unless you are specifically asked for the level of your worst pain, or your average pain. If you are asked for one of these two, then it should be understood that it is over the past 24  hours.  Basic Activities of Daily Living (ADL): Personal hygiene, dressing, eating, transferring, and using restroom.  Instructions: Most patients tend to report their level of pain as a combination of two factors, their physical pain and their psychosocial pain. This last one is also known as "suffering" and it is reflection of how physical pain affects you socially and psychologically. From now on, report them separately. From this point on, when asked to report your pain level, report only your physical pain. Use the following table for reference.  Pain Clinic Pain Levels (0-5/10)  Pain Level Score  Description  No Pain 0   Mild pain 1 Nagging, annoying, but does not interfere with basic activities of daily living (ADL). Patients are able to eat, bathe, get dressed, toileting (being able to get on and off the toilet and perform personal hygiene functions), transfer (move in and out of bed or a chair without assistance), and maintain continence (able to control bladder and bowel functions). Blood pressure and heart rate are unaffected. A normal heart rate for a healthy adult ranges from 60 to 100 bpm (beats per minute).   Mild to moderate pain 2 Noticeable and distracting. Impossible to hide from other people. More frequent flare-ups. Still possible to adapt and function close to normal. It can be very annoying and may  have occasional stronger flare-ups. With discipline, patients may get used to it and adapt.   Moderate pain 3 Interferes significantly with activities of daily living (ADL). It becomes difficult to feed, bathe, get dressed, get on and off the toilet or to perform personal hygiene functions. Difficult to get in and out of bed or a chair without assistance. Very distracting. With effort, it can be ignored when deeply involved in activities.   Moderately severe pain 4 Impossible to ignore for more than a few minutes. With effort, patients may still be able to manage work or participate  in some social activities. Very difficult to concentrate. Signs of autonomic nervous system discharge are evident: dilated pupils (mydriasis); mild sweating (diaphoresis); sleep interference. Heart rate becomes elevated (>115 bpm). Diastolic blood pressure (lower number) rises above 100 mmHg. Patients find relief in laying down and not moving.   Severe pain 5 Intense and extremely unpleasant. Associated with frowning face and frequent crying. Pain overwhelms the senses.  Ability to do any activity or maintain social relationships becomes significantly limited. Conversation becomes difficult. Pacing back and forth is common, as getting into a comfortable position is nearly impossible. Pain wakes you up from deep sleep. Physical signs will be obvious: pupillary dilation; increased sweating; goosebumps; brisk reflexes; cold, clammy hands and feet; nausea, vomiting or dry heaves; loss of appetite; significant sleep disturbance with inability to fall asleep or to remain asleep. When persistent, significant weight loss is observed due to the complete loss of appetite and sleep deprivation.  Blood pressure and heart rate becomes significantly elevated. Caution: If elevated blood pressure triggers a pounding headache associated with blurred vision, then the patient should immediately seek attention at an urgent or emergency care unit, as these may be signs of an impending stroke.    Emergency Department Pain Levels (6-10/10)  Emergency Room Pain 6 Severely limiting. Requires emergency care and should not be seen or managed at an outpatient pain management facility. Communication becomes difficult and requires great effort. Assistance to reach the emergency department may be required. Facial flushing and profuse sweating along with potentially dangerous increases in heart rate and blood pressure will be evident.   Distressing pain 7 Self-care is very difficult. Assistance is required to transport, or use restroom.  Assistance to reach the emergency department will be required. Tasks requiring coordination, such as bathing and getting dressed become very difficult.   Disabling pain 8 Self-care is no longer possible. At this level, pain is disabling. The individual is unable to do even the most "basic" activities such as walking, eating, bathing, dressing, transferring to a bed, or toileting. Fine motor skills are lost. It is difficult to think clearly.   Incapacitating pain 9 Pain becomes incapacitating. Thought processing is no longer possible. Difficult to remember your own name. Control of movement and coordination are lost.   The worst pain imaginable 10 At this level, most patients pass out from pain. When this level is reached, collapse of the autonomic nervous system occurs, leading to a sudden drop in blood pressure and heart rate. This in turn results in a temporary and dramatic drop in blood flow to the brain, leading to a loss of consciousness. Fainting is one of the body's self defense mechanisms. Passing out puts the brain in a calmed state and causes it to shut down for a while, in order to begin the healing process.    Summary: 1. Refer to this scale when providing [place] with your pain level. 2.  Be accurate and careful when reporting your pain level. This will help with your care. 3. Over-reporting your pain level will lead to loss of credibility. 4. Even a level of 1/10 means that there is pain and will be treated at our facility. 5. High, inaccurate reporting will be documented as "Symptom Exaggeration", leading to loss of credibility and suspicions of possible secondary gains such as obtaining more narcotics, or wanting to appear disabled, for fraudulent reasons. 6. Only pain levels of 5 or below will be seen at our facility. 7. Pain levels of 6 and above will be sent to the Emergency Department and the appointment  cancelled. ____________________________________________________________________________________________

## 2018-05-28 ENCOUNTER — Other Ambulatory Visit: Payer: Self-pay | Admitting: Thoracic Surgery (Cardiothoracic Vascular Surgery)

## 2018-05-28 DIAGNOSIS — C3490 Malignant neoplasm of unspecified part of unspecified bronchus or lung: Secondary | ICD-10-CM

## 2018-05-29 ENCOUNTER — Encounter: Payer: Self-pay | Admitting: Thoracic Surgery (Cardiothoracic Vascular Surgery)

## 2018-05-29 ENCOUNTER — Ambulatory Visit
Admission: RE | Admit: 2018-05-29 | Discharge: 2018-05-29 | Disposition: A | Discharge status: Home / Self Care | Payer: Medicare Other | Source: Ambulatory Visit | Attending: Thoracic Surgery (Cardiothoracic Vascular Surgery) | Admitting: Thoracic Surgery (Cardiothoracic Vascular Surgery)

## 2018-05-29 ENCOUNTER — Ambulatory Visit (INDEPENDENT_AMBULATORY_CARE_PROVIDER_SITE_OTHER): Payer: Medicare Other | Admitting: Thoracic Surgery (Cardiothoracic Vascular Surgery)

## 2018-05-29 VITALS — BP 113/81 | HR 67 | Resp 20 | Ht 62.0 in | Wt 153.0 lb

## 2018-05-29 DIAGNOSIS — Z902 Acquired absence of lung [part of]: Secondary | ICD-10-CM | POA: Diagnosis not present

## 2018-05-29 DIAGNOSIS — C3490 Malignant neoplasm of unspecified part of unspecified bronchus or lung: Secondary | ICD-10-CM

## 2018-05-29 DIAGNOSIS — C3491 Malignant neoplasm of unspecified part of right bronchus or lung: Secondary | ICD-10-CM

## 2018-05-29 NOTE — Progress Notes (Signed)
[address]       [place] [address]             [phone]     HPI: [name] returns for a scheduled follow-up visit.   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  She did well in the early postoperative period but had some persistent neuropathic pain after that.  She has been followed by Dr. Julien Nordmann with no evidence of recurrent disease.  I last saw her in November 2018.  She was doing well at that time.  She did still complain of some pain.  Her description of the pain has always been rather dramatic relative to any functional limitation.  She has seen Dr. Dossie Arbour in [place] regarding that.  Over the summer she had some left-sided rib fractures.  She says she still has some pain over there as well.  She complains that she gets dizzy when she bends over and then straightens up quickly.  She has not had any respiratory problems and says that she is not having any wheezing.  Past Medical History:  Diagnosis Date  . Adenocarcinoma of right lung, stage 1 ([facility]) 07/28/2016  . Anxiety   . Cancer ([place])    uterine  . COPD (chronic obstructive pulmonary disease) ([place])   . Cyst    left side of neck  . Depression   . GERD (gastroesophageal reflux disease)   . Heart murmur    per patient  . Hyperlipidemia   . Hypertension   . Low back pain   . Lumbar back pain   . Osteoarthritis   . Osteoporosis      Current Outpatient Medications  Medication Sig Dispense Refill  . albuterol (PROVENTIL HFA;VENTOLIN HFA) 108 (90 BASE) MCG/ACT inhaler Inhale 2 puffs into the lungs every 6 (six) hours as needed. (Patient taking differently: Inhale 2 puffs into the lungs every 4 (four) hours as needed for wheezing or shortness of breath. ) 18 g 1  . albuterol (PROVENTIL) (2.5 MG/3ML) 0.083% nebulizer solution Take 3 mLs (2.5 mg total) by  nebulization every 2 (two) hours as needed for wheezing or shortness of breath. 75 mL 12  . aluminum-magnesium hydroxide-simethicone (MAALOX) 884-166-06 MG/5ML SUSP Take 30 mLs by mouth as needed (indigestion).    Marland Kitchen amLODipine (NORVASC) 5 MG tablet Take 5 mg by mouth daily.    Marland Kitchen atorvastatin (LIPITOR) 20 MG tablet Take 20 mg by mouth daily.     . diazepam (VALIUM) 5 MG tablet Take 2.5 mg by mouth every 8 (eight) hours as needed for anxiety (for nerves). Must last 30 days. Filled 11-28-17    . Fluticasone-Salmeterol (ADVAIR DISKUS) 250-50 MCG/DOSE AEPB Inhale 1 puff into the lungs every 12 (twelve) hours. 60 each 0  . gabapentin (NEURONTIN) 400 MG capsule Take 1 capsule (400 mg total) by mouth 4 (four) times daily. 120 capsule 2  . Glucosamine HCl (GLUCOSAMINE PO) Take 1 tablet by mouth daily.    Marland Kitchen lisinopril (PRINIVIL,ZESTRIL) 10 MG tablet Take 1 tablet (10 mg total) by mouth every morning. 30 tablet 0  . Magnesium 250 MG TABS Take 250 mg by mouth daily.    . meloxicam (MOBIC) 7.5 MG tablet Take 7.5 mg by mouth daily.    . Menthol, Topical Analgesic, (BERRI-FREEZ PAIN RELIEVING) 10 % LIQD Apply 1 application topically  as needed (pain).    Marland Kitchen metoCLOPramide (REGLAN) 5 MG tablet Take 5 mg by mouth 2 (two) times daily.     . metoprolol tartrate (LOPRESSOR) 25 MG tablet Take 0.5 tablets (12.5 mg total) by mouth 2 (two) times daily. 60 tablet 0  . Multiple Vitamins-Minerals (WOMENS 50+ ADVANCED PO) Take 1 tablet by mouth daily.    . NONFORMULARY OR COMPOUNDED ITEM Sig: Apply 1-2 gm(s) (2-4 pumps) to affected area, 3-4 times/day. (1 pump = 0.5 gm) 1 each 2  . omega-3 acid ethyl esters (LOVAZA) 1 g capsule Take by mouth 2 (two) times daily.    . pantoprazole (PROTONIX) 40 MG tablet Take 1 tablet (40 mg total) by mouth daily at 6 (six) AM. 30 tablet 0  . raloxifene (EVISTA) 60 MG tablet Take 60 mg by mouth daily.    . sertraline (ZOLOFT) 100 MG tablet Take 100 mg by mouth daily.    Marland Kitchen tiotropium (SPIRIVA)  18 MCG inhalation capsule Place 18 mcg into inhaler and inhale daily.      . traMADol (ULTRAM) 50 MG tablet Take 50 mg by mouth 3 (three) times daily as needed.     No current facility-administered medications for this visit.     Physical Exam BP 113/81   Pulse 67   Resp 20   Ht 5\' 2"  (1.575 m)   Wt 153 lb (69.4 kg)   SpO2 96% Comment: RA  BMI 27.3 kg/m  77 year old woman in no acute distress Alert and oriented x3 with no focal deficits No cervical or subclavicular adenopathy Lungs slightly diminished at right base, otherwise clear, no wheezing Cardiac regular rate and rhythm normal S1-S2  Diagnostic Tests: CHEST - 2 VIEW  COMPARISON:  Chest x-ray of March 26, 2018  FINDINGS: The lungs are hyperinflated with hemidiaphragm flattening. There is stable pleural thickening along the right lower lateral thoracic wall. There is increased density surrounding a fractured posterior left sixth or seventh rib which has appeared since the previous study. There is no pleural effusion. The heart and pulmonary vascularity are normal. There is mural calcification in the wall of the thoracic aorta. There is prominent thoracic kyphosis and multilevel degenerative disc disease.  IMPRESSION: Chronic bronchitic changes, stable. Stable pleural thickening in the right lung. No definite evidence of recurrent disease.  Increased density surrounding the fractured posterolateral left sixth or seventh rib when compared to the previous study.  Thoracic aortic atherosclerosis.   Electronically Signed   By: David  Martinique M.D.   On: 05/29/2018 08:48 I personally reviewed the chest x-ray images and concur with the findings noted above.  Impression: [name] is a 77 year old woman who is now 2 years out from a thoracoscopic right upper lobectomy for a stage IA non-small cell carcinoma.  She did not require adjuvant therapy and currently is on observation.  She is being followed by  Dr. Julien Nordmann on a regular basis.  She has no evidence of recurrent disease.  Chronic pain-now has pain on both sides, actually her left side is been hurting her more than the right recently.  Chest x-ray shows callus formation in the setting of rib fractures that were found over the summer.  Followed by Dr. Dossie Arbour.  Tobacco abuse-quit in 2013.  Thoracic aortic atherosclerosis-on a beta-blocker and a statin.  Plan: Follow-up as scheduled with Dr. Julien Nordmann  I will see her back in 1 year to check on her progress.  [name], MD [facility] [phone]

## 2018-06-05 ENCOUNTER — Telehealth: Payer: Self-pay | Admitting: Nurse Practitioner

## 2018-06-05 NOTE — Telephone Encounter (Addendum)
Patient came to office today stating the salve is not working for her pain and wants to be seen sooner for something different. Next appt is January. Please advise

## 2018-06-07 NOTE — Progress Notes (Signed)
Patient left w/o completing her visit with me.

## 2018-06-11 ENCOUNTER — Encounter: Payer: Self-pay | Admitting: Pain Medicine

## 2018-06-11 ENCOUNTER — Ambulatory Visit (HOSPITAL_BASED_OUTPATIENT_CLINIC_OR_DEPARTMENT_OTHER): Payer: Medicare Other | Admitting: Pain Medicine

## 2018-06-11 ENCOUNTER — Other Ambulatory Visit: Payer: Self-pay | Admitting: Pain Medicine

## 2018-06-11 ENCOUNTER — Other Ambulatory Visit: Payer: Self-pay

## 2018-06-11 VITALS — BP 158/87 | HR 67 | Temp 98.0°F | Resp 20 | Ht 62.0 in | Wt 164.0 lb

## 2018-06-11 DIAGNOSIS — G8912 Acute post-thoracotomy pain: Secondary | ICD-10-CM

## 2018-06-11 DIAGNOSIS — G894 Chronic pain syndrome: Secondary | ICD-10-CM

## 2018-06-11 DIAGNOSIS — M5414 Radiculopathy, thoracic region: Secondary | ICD-10-CM

## 2018-06-11 DIAGNOSIS — M5134 Other intervertebral disc degeneration, thoracic region: Secondary | ICD-10-CM | POA: Insufficient documentation

## 2018-06-11 NOTE — Progress Notes (Signed)
Safety precautions to be maintained throughout the outpatient stay will include: orient to surroundings, keep bed in low position, maintain call bell within reach at all times, provide assistance with transfer out of bed and ambulation.  

## 2018-06-11 NOTE — Progress Notes (Signed)
Patient's Name: [name]  MRN: [id number]  Referring Provider: Wenda Low, MD  DOB: 03/21/1941  PCP: Wenda Low, MD  DOS: 06/11/2018  Note by: Gaspar Cola, MD  Service setting: Ambulatory outpatient  Specialty: Interventional Pain Management  Location: [facility] (AMB) Pain Management Facility    Patient type: Established   HPI  Today the patient was scheduled to come in and 11:30 AM for an evaluation that she had requested.  Instead, she came in at 9:11 AM, thinking that she may be able to be seen earlier.  Unfortunately, today we had a rather busy day with several complicated cases or we were unable to work her into an earlier appointment.  By the time we were able to get to her, she had already been here for more than 2 hours and she decided not to wait any longer and left.  The information below is what was taken by the nursing staff and what I had reviewed during my pre-charting, this morning.  Because I did not see the patient personally today, I will not be charging her for the visit.  However, because I did review her case and I came up with some alternatives, and I have created this note as a reminder of those possible avenues of action.  Pain Assessment: Location: Left Rib cage Radiating: mid back Onset: More than a month ago Duration: Chronic pain Quality: Aching, Burning, Stabbing Severity: 6 /10 (subjective, self-reported pain score)  Timing: Constant Modifying factors: rest BP: (!) 158/87  HR: 67  Diagnostic Left Intercostal NB(T7, T8, T9, T10)did not provide her with benefit.  However, I did personally evaluate the block after I had completed it, and she did get good numbness over the distribution of the T7-T10 intercostal nerve suggesting that the block was effective however, because it did not relieve the pain this would also suggest that the pathology is more proximal to the area that was blocked and therefore likely to be a radiculitis.   Laboratory  Chemistry  Inflammation Markers (CRP: Acute Phase) (ESR: Chronic Phase) Lab Results  Component Value Date   LATICACIDVEN 1.6 01/31/2010                         Rheumatology Markers No results found.  Renal Function Markers Lab Results  Component Value Date   BUN 20 03/26/2018   CREATININE 1.38 (H) 03/26/2018   BCR 7 (L) 01/17/2018   GFRAA 42 (L) 03/26/2018   GFRNONAA 37 (L) 03/26/2018                             Hepatic Function Markers Lab Results  Component Value Date   AST 17 01/26/2018   ALT 11 01/26/2018   ALBUMIN 3.8 01/26/2018   ALKPHOS 51 01/26/2018   LIPASE 35 01/08/2018                        Electrolytes Lab Results  Component Value Date   NA 142 03/26/2018   K 3.2 (L) 03/26/2018   CL 106 03/26/2018   CALCIUM 8.7 (L) 03/26/2018   MG 2.2 01/17/2018                        Neuropathy Markers Lab Results  Component Value Date   HGBA1C 5.3 08/29/2015  CNS Tests No results found.  Bone Pathology Markers No results found.  Coagulation Parameters Lab Results  Component Value Date   INR 1.00 12/09/2017   LABPROT 13.1 12/09/2017   APTT 30 05/05/2016   PLT 226 03/26/2018                        Cardiovascular Markers Lab Results  Component Value Date   BNP 116.2 (H) 12/08/2017   TROPONINI 0.23 (HH) 12/08/2017   HGB 12.4 03/26/2018   HCT 39.1 03/26/2018                         CA Markers No results found.  Note: Lab results reviewed.  Imaging Review  Cervical Imaging: Cervical CT wo contrast:  Results for orders placed during the hospital encounter of 01/31/10  CT Cervical Spine Wo Contrast   Narrative Clinical Data:  Fall from horse, headache, trauma   CT HEAD WITHOUT CONTRAST CT CERVICAL SPINE WITHOUT CONTRAST   Technique:  Multidetector CT imaging of the head and cervical spine was performed following the standard protocol without intravenous contrast.  Multiplanar CT image reconstructions of the  cervical spine were also generated.   Comparison:  10/08/2008 head CT   CT HEAD   Findings: Diffuse brain atrophy noted of the cerebrum and the cerebellum.  Patchy microvascular ischemic changes in the subcortical and periventricular white matter.  Cisterns patent. Left orbital region soft tissue swelling noted.  No visualized skull fracture.  Exam is limited with some motion artifact. Minimal ethmoid and maxillary mucosal thickening.  Mastoids clear.   IMPRESSION: Stable atrophy and microvascular ischemic changes.  No acute intracranial finding   CT CERVICAL SPINE   Findings: Normal cervical spine alignment.  No compression fracture, wedge shaped deformity or focal kyphosis.  Facets aligned.  Intact odontoid.  Degenerative changes at the C1-2 articulation anteriorly.  Normal prevertebral soft tissues.  No large epidural hematoma noted.  No soft tissue asymmetry in the neck.   IMPRESSION: No acute fracture of the cervical spine.  Provider: Beaulah Corin   Shoulder Imaging: Shoulder-L DG:  Results for orders placed during the hospital encounter of 07/21/05  DG Shoulder Left   Narrative Clinical Data: Shoulder pain. No known injury.   LEFT SHOULDER - 3 VIEW:  There are some mild degenerative changes at the acromioclavicular joint. Otherwise, normal exam.   IMPRESSION:  Mild AC joint arthritis.  Provider: Nancy Nordmann   Thoracic Imaging: Thoracic CT wo contrast:  Results for orders placed during the hospital encounter of 05/21/18  CT THORACIC SPINE WO CONTRAST   Narrative CLINICAL DATA:  Thoracic radiculitis. Left-sided thoracic pain. Lung cancer  EXAM: CT THORACIC SPINE WITHOUT CONTRAST  TECHNIQUE: Multidetector CT images of the thoracic were obtained using the standard protocol without intravenous contrast.  COMPARISON:  Chest two-view 03/26/2018, CT chest 01/26/2018  FINDINGS: Alignment: Normal alignment.  Moderate thoracic kyphosis unchanged.  Vertebrae:  Negative for fracture. No lytic or sclerotic bone lesion.  Paraspinal and other soft tissues: Atherosclerotic calcification in the aorta without aneurysm. No paraspinous mass. Apical emphysema.  Disc levels: Negative for spinal stenosis. Multilevel disc degeneration. Multiple Schmorl's nodes are present throughout the mid and lower thoracic spine. Disc protrusions are best evaluated by MRI.  IMPRESSION: Negative for fracture or mass in the thoracic spine. No significant spinal stenosis.   Electronically Signed   By: Franchot Gallo M.D.   On: 05/21/2018 19:11  Lumbosacral Imaging: Lumbar MR w/wo contrast:  Results for orders placed in visit on 08/11/98  MR Lumbar Spine W Wo Contrast   Narrative FINDINGS CLINICAL DATA:   MID-BACK, RIGHT HIP AND LOWER EXTREMITY PAIN.  HISTORY OF LUMBAR SURGERY. LUMBAR SPINE WITH FLEXION AND EXTENSION VIEWS: AP-OBLIQUE AND ALSO LATERAL NEUTRAL FLEXION POSITION VIEWS WERE OBTAINED.  THERE ARE METALLIC CLIPS IN THE RIGHT UPPER QUADRANT SECONDARY TO CHOLECYSTECTOMY.  THERE ARE PAIRED THREADED INTERBODY RAY CAGES AT L4-5, WHICH APPEAR IN SATISFACTORY POSITION.  THEY ARE UNCHANGED IN POSITION.  EXPECTED POSTERIOR ELEMENT RESECTION DEFECTS ARE NOTED.  THERE IS NO DISC SPACE NARROWING OR SPONDYLOLISTHESIS.  DEGENERATIVE FACET ARTHROPATHIC CHANGES ARE NOTED AT L5-S1 WITH JOINT SPACE NARROWING AND SCLEROSIS. MRI OF THE LUMBAR SPINE WITH AND WITHOUT IV CONTRAST: MULTIPLANAR, MULTISEQUENCE IMAGES WERE ACQUIRED BEFORE AND AFTER IV OMNISCAN INJECTION. DISTAL CONUS MEDULLARIS APPEARS NORMAL.  L1-2, L2-3, L3-4 AND L5-S1 LEVELS ARE NEGATIVE FOR HNP OR STENOSIS.  DEGENERATIVE FACET ARTHROPATHIC CHANGES ARE PRESENT AT L5-S1 WITH MODERATE FACET HYPERTROPHY.  NO BONY STENOSIS IS DETECTED AT ANY LEVEL.  THE RAY CAGES CREATE AN ARTIFACT AT L4-5 AS EXPECTED.  I DETECT NO EVIDENCE  OF ENCROACHMENT ON THE CENTRAL CANAL OR FORAMINA BY THE CAGES. LAMINECTOMY AND POSTERIOR  ELEMENT RESECTION DEFECTS ARE NOTED AT L4-5.  THERE IS CONTRAST-ENHANCING POST-OPERATIVE SOFT TISSUE MATERIAL SURROUNDING THE THECAL SAC AT L4-5, HOWEVER, THERE IS NO DEFORMATION OF THE SAC.  THERE IS NO UNUSUAL NERVE ROOT ENHANCEMENT AND NO MR EVIDENCE FOR ARACHNOIDITIS.  THERE IS CONTRAST ENHANCEMENT OF THE L4 AND L5 VERTEBRAL BODIES WHICH IS NOT AN UNUSUAL FINDING POST-RAY CAGE PLACEMENT. IMPRESSION   Lumbar DG Bending views:  Results for orders placed during the hospital encounter of 06/21/17  DG Lumbar Spine Complete W/Bend   Narrative CLINICAL DATA:  Chronic bilateral low back pain with bilateral sciatica  EXAM: LUMBAR SPINE - COMPLETE WITH BENDING VIEWS  COMPARISON:  12/01/2010  FINDINGS: Negative for fracture.  Extensive osteopenia.  Normal alignment.  Threaded interbody cages L4-5 in good position and unchanged. Negative for pars defect. Mild facet degeneration at L5-S1 without significant disc space narrowing  Mild atherosclerotic calcification  Cholecystectomy clips. Left hip pinning. Chronic fracture right pubic bone  IMPRESSION: Advanced osteopenia.  Negative for fracture.  Interbody fusion L4-5. Otherwise no significant disc degeneration. Facet degeneration L5-S1.   Electronically Signed   By: Franchot Gallo M.D.   On: 06/21/2017 15:34    Sacroiliac Joint Imaging: Sacroiliac Joint DG:  Results for orders placed during the hospital encounter of 06/21/17  DG Si Joints   Narrative CLINICAL DATA:  Chronic sacroiliac pain  EXAM: BILATERAL SACROILIAC JOINTS - 3+ VIEW  COMPARISON:  12/01/2010  FINDINGS: Normal SI joint bilaterally. No erosion or mass. No significant arthropathy. Threaded cage fusion L4-5. Generalized osteopenia.  Chronic fractures of the pubic   on the right.  Left hip pinning  IMPRESSION: Negative SI joints  Chronic right pubic fracture   Electronically Signed   By: Franchot Gallo M.D.   On: 06/21/2017 15:32    Hip  Imaging: Hip-R DG 2-3 views:  Results for orders placed during the hospital encounter of 06/21/17  DG HIP UNILAT W OR W/O PELVIS 2-3 VIEWS RIGHT   Narrative CLINICAL DATA:  Chronic bilateral hip pain  EXAM: DG HIP (WITH OR WITHOUT PELVIS) 2-3V RIGHT  COMPARISON:  None.  FINDINGS: Chronic fracture right superior inferior pubic rami. No acute fracture  Both hip joints normal. Prior fracture fixation  left femoral neck fracture with 3 pins. Interbody fusion L4-5  IMPRESSION: Chronic fracture right pubic bone.  No acute abnormality.   Electronically Signed   By: Franchot Gallo M.D.   On: 06/21/2017 15:36    Hip-L DG 2-3 views:  Results for orders placed during the hospital encounter of 06/21/17  DG HIP UNILAT W OR W/O PELVIS 2-3 VIEWS LEFT   Narrative CLINICAL DATA:  Chronic bilateral hip pain  EXAM: DG HIP (WITH OR WITHOUT PELVIS) 2-3V LEFT  COMPARISON:  None.  FINDINGS: Healed fracture left hip which has been fixed with 3 threaded screws in good position. Left hip joint normal  Right hip joint normal  Chronic healed fracture right inferior and superior pubic rami. Interbody fusion L4-5. Negative for acute fracture  IMPRESSION: Chronic healed fracture left femoral neck  Normal right hip  Chronic fracture right pubic bone   Electronically Signed   By: Franchot Gallo M.D.   On: 06/21/2017 15:35    Knee-L DG 4 views:  Results for orders placed during the hospital encounter of 10/26/09  DG Knee Complete 4 Views Left   Narrative Clinical Data: Twisting knee injury with knee pain.   LEFT KNEE - COMPLETE 4+ VIEW   Comparison: None.   Findings: There is prominent tri-compartmental loss articular space compatible with osteoarthritis.  The overlap of the condyles and tibial plateau due to the loss of articular space and obliquity of imaging reduces sensitivity for fracture, and there is a suggestion of mild impaction of the lateral tibial plateau.   Equivocal appearance for knee effusion.   IMPRESSION:   1.  Prominent tri-compartmental loss of articular space. 2.  Suspected mild impaction along the lateral tibial plateau, although a well-defined fracture line is not visible. 3.  MRI may be helpful for further delineation of internal derangement, if clinically warranted.  Provider: Kathreen Cosier   Ankle Imaging: Ankle-L DG Complete:  Results for orders placed during the hospital encounter of 03/13/05  DG Ankle Complete Left   Narrative Clinical Data: Left ankle injury with pain and swelling.  3-VIEW LEFT ANKLE:  Findings: No evidence of acute fracture, subluxation, or dislocation. The ankle mortise is intact. Small ankle fusion is identified. Remote medial malleolar fracture is present.   IMPRESSION:  Ankle effusion without evidence of acute fracture.  Provider: Paulla Fore   Wrist Imaging: Wrist-L DG Complete:  Results for orders placed during the hospital encounter of 10/16/17  DG Wrist Complete Left   Narrative CLINICAL DATA:  Wrist pain following fall 2 weeks ago, subsequent encounter  EXAM: LEFT WRIST - COMPLETE 3+ VIEW  COMPARISON:  10/09/2017  FINDINGS: Mild degenerative changes are noted at the first [facility] joint. No acute fracture or dislocation is seen. No soft tissue changes are noted.  IMPRESSION: No acute abnormality noted.   Electronically Signed   By: Inez Catalina M.D.   On: 10/16/2017 11:11    Hand Imaging: Hand-R DG Complete:  Results for orders placed during the hospital encounter of 10/06/07  DG Hand Complete Right   Narrative Clinical data: Crush injury   Right hand three-view:   No previous for comparison. Mild osteopenia. Normal alignment. Negative for fracture, dislocation, or other acute bone injury. Early degenerative changes at the first carpometacarpal articulation. No focal soft tissue swelling localized.   Impression: 1. Mild osteopenia without fracture or other acute  bone abnormality  Provider: Alfonso Ellis   Complexity Note: Results reviewed.  Meds   Current Outpatient Medications:  .  albuterol (PROVENTIL HFA;VENTOLIN HFA) 108 (90 BASE) MCG/ACT inhaler, Inhale 2 puffs into the lungs every 6 (six) hours as needed. (Patient taking differently: Inhale 2 puffs into the lungs every 4 (four) hours as needed for wheezing or shortness of breath. ), Disp: 18 g, Rfl: 1 .  albuterol (PROVENTIL) (2.5 MG/3ML) 0.083% nebulizer solution, Take 3 mLs (2.5 mg total) by nebulization every 2 (two) hours as needed for wheezing or shortness of breath., Disp: 75 mL, Rfl: 12 .  aluminum-magnesium hydroxide-simethicone (MAALOX) 235-361-44 MG/5ML SUSP, Take 30 mLs by mouth as needed (indigestion)., Disp: , Rfl:  .  amLODipine (NORVASC) 5 MG tablet, Take 5 mg by mouth daily., Disp: , Rfl:  .  atorvastatin (LIPITOR) 20 MG tablet, Take 20 mg by mouth daily. , Disp: , Rfl:  .  diazepam (VALIUM) 5 MG tablet, Take 2.5 mg by mouth every 8 (eight) hours as needed for anxiety (for nerves). Must last 30 days. Filled 11-28-17, Disp: , Rfl:  .  Fluticasone-Salmeterol (ADVAIR DISKUS) 250-50 MCG/DOSE AEPB, Inhale 1 puff into the lungs every 12 (twelve) hours., Disp: 60 each, Rfl: 0 .  gabapentin (NEURONTIN) 400 MG capsule, Take 1 capsule (400 mg total) by mouth 4 (four) times daily., Disp: 120 capsule, Rfl: 2 .  Glucosamine HCl (GLUCOSAMINE PO), Take 1 tablet by mouth daily., Disp: , Rfl:  .  lisinopril (PRINIVIL,ZESTRIL) 10 MG tablet, Take 1 tablet (10 mg total) by mouth every morning., Disp: 30 tablet, Rfl: 0 .  Magnesium 250 MG TABS, Take 250 mg by mouth daily., Disp: , Rfl:  .  meloxicam (MOBIC) 7.5 MG tablet, Take 7.5 mg by mouth daily., Disp: , Rfl:  .  Menthol, Topical Analgesic, (BERRI-FREEZ PAIN RELIEVING) 10 % LIQD, Apply 1 application topically as needed (pain)., Disp: , Rfl:  .  metoCLOPramide (REGLAN) 5 MG tablet, Take 5 mg by mouth 2 (two) times daily. , Disp:  , Rfl:  .  metoprolol tartrate (LOPRESSOR) 25 MG tablet, Take 0.5 tablets (12.5 mg total) by mouth 2 (two) times daily., Disp: 60 tablet, Rfl: 0 .  Multiple Vitamins-Minerals (WOMENS 50+ ADVANCED PO), Take 1 tablet by mouth daily., Disp: , Rfl:  .  NONFORMULARY OR COMPOUNDED ITEM, Sig: Apply 1-2 gm(s) (2-4 pumps) to affected area, 3-4 times/day. (1 pump = 0.5 gm), Disp: 1 each, Rfl: 2 .  omega-3 acid ethyl esters (LOVAZA) 1 g capsule, Take by mouth 2 (two) times daily., Disp: , Rfl:  .  pantoprazole (PROTONIX) 40 MG tablet, Take 1 tablet (40 mg total) by mouth daily at 6 (six) AM., Disp: 30 tablet, Rfl: 0 .  raloxifene (EVISTA) 60 MG tablet, Take 60 mg by mouth daily., Disp: , Rfl:  .  sertraline (ZOLOFT) 100 MG tablet, Take 100 mg by mouth daily., Disp: , Rfl:  .  tiotropium (SPIRIVA) 18 MCG inhalation capsule, Place 18 mcg into inhaler and inhale daily.  , Disp: , Rfl:  .  traMADol (ULTRAM) 50 MG tablet, Take 50 mg by mouth 3 (three) times daily as needed., Disp: , Rfl:   Constitutional Exam  General appearance: Well nourished, well developed, and well hydrated. In no apparent acute distress Vitals:   06/11/18 1128  BP: (!) 158/87  Pulse: 67  Resp: 20  Temp: 98 F (36.7 C)  TempSrc: Oral  SpO2: 100%  Weight: 164 lb (74.4 kg)  Height: '5\' 2"'  (1.575 m)   BMI Assessment: Estimated body mass index is  30 kg/m as calculated from the following:   Height as of this encounter: '5\' 2"'  (1.575 m).   Weight as of this encounter: 164 lb (74.4 kg).  Patient's current BMI Ideal Body weight  Body mass index is 30 kg/m. Ideal body weight: 50.1 kg (110 lb 7.2 oz) Adjusted ideal body weight: 59.8 kg (131 lb 13.9 oz)   Assessment  Primary Diagnosis & Pertinent Problem List: The primary diagnosis is Chronic chest wall pain (Primary Area of Pain). 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  Plan of Care  Patient left today before we could make arrangements for any further plans.  Interventional management options: Planned, scheduled, and/or pending:   Because the patient did get good numbness over the distribution of the T7, T8, T9, and T10 intercostal nerves after the block, that tells me that the origin of the pain is proximal to the area that we have blocked.  Because this confirms that this is not where the pain is coming from, we will be moving onto all the possibilities.  We recently did a CT of the thoracic spine, which did not reveal any acute pathology.  However, clinically the patient continues to have symptoms compatible with a right sided thoracic radiculitis.  Because of this, I am considering changing the plan to include a diagnostic right sided interlaminar thoracic ESI #1 under fluoroscopic guidance and IV sedation.  This has not been scheduled, but when the patient returns we will consider this alternative, once we have talked to her about it.   Considering:   Diagnostic right-sided thoracic ESI #1 under fluoroscopic guidance and IV sedation Diagnostic right-sided thoracic transforaminal ESI vs. Selective nerve root blocks Diagnostic bilateral lumbar facet block Possible bilateral lumbar facet RFA Diagnostic bilateral sacroiliac joint block Possible bilateral sacroiliac joint RFA Diagnostic right-sided L4-5 interlaminar ESI Diagnostic bilateral L4-5 vs L5-S1transforaminal ESI Diagnostic(Midline)caudal ESI +  diagnostic epidurogram Diagnostic bilateral intra-articular hip joint injection Diagnostic bilateral femoral nerve +obturatornerve block Possible bilateral femoral nerve +obturatornerve RFA Possible Racz procedure   Palliative PRN treatment(s):   None at this time   Provider-requested follow-up: Return for Procedure (w/ sedation): (R) TESI #1.  Future Appointments  Date Time Provider [place]  07/30/2018 12:00 PM [facility] 1 [facility] None  08/01/2018 11:00 AM Curt Bears, MD [facility] None  08/21/2018 11:15 AM Vevelyn Francois, NP [facility] None   Primary Care Physician: Wenda Low, MD Location: [facility] Note by: Gaspar Cola, MD Date: 06/11/2018; Time: 1:01 PM

## 2018-07-30 ENCOUNTER — Inpatient Hospital Stay: Payer: Medicare Other | Attending: Internal Medicine

## 2018-07-30 DIAGNOSIS — Z79899 Other long term (current) drug therapy: Secondary | ICD-10-CM | POA: Insufficient documentation

## 2018-07-30 DIAGNOSIS — C3411 Malignant neoplasm of upper lobe, right bronchus or lung: Secondary | ICD-10-CM | POA: Diagnosis present

## 2018-07-30 DIAGNOSIS — I10 Essential (primary) hypertension: Secondary | ICD-10-CM | POA: Insufficient documentation

## 2018-07-30 DIAGNOSIS — Z902 Acquired absence of lung [part of]: Secondary | ICD-10-CM | POA: Insufficient documentation

## 2018-07-30 DIAGNOSIS — C3490 Malignant neoplasm of unspecified part of unspecified bronchus or lung: Secondary | ICD-10-CM

## 2018-07-30 LAB — CMP (CANCER CENTER ONLY)
ALT: 13 U/L (ref 0–44)
AST: 18 U/L (ref 15–41)
Albumin: 3.6 g/dL (ref 3.5–5.0)
Alkaline Phosphatase: 61 U/L (ref 38–126)
Anion gap: 7 (ref 5–15)
BUN: 14 mg/dL (ref 8–23)
CO2: 28 mmol/L (ref 22–32)
Calcium: 8.8 mg/dL — ABNORMAL LOW (ref 8.9–10.3)
Chloride: 107 mmol/L (ref 98–111)
Creatinine: 1.16 mg/dL — ABNORMAL HIGH (ref 0.44–1.00)
GFR, Est AFR Am: 53 mL/min — ABNORMAL LOW (ref >60)
GFR, Estimated: 46 mL/min — ABNORMAL LOW (ref >60)
Glucose, Bld: 85 mg/dL (ref 70–99)
Potassium: 4.3 mmol/L (ref 3.5–5.1)
Sodium: 142 mmol/L (ref 135–145)
Total Bilirubin: 0.5 mg/dL (ref 0.3–1.2)
Total Protein: 7 g/dL (ref 6.5–8.1)

## 2018-07-30 LAB — CBC WITH DIFFERENTIAL (CANCER CENTER ONLY)
Abs Immature Granulocytes: 0.02 10*3/uL (ref 0.00–0.07)
Basophils Absolute: 0.1 10*3/uL (ref 0.0–0.1)
Basophils Relative: 1 %
Eosinophils Absolute: 0.2 10*3/uL (ref 0.0–0.5)
Eosinophils Relative: 2 %
HCT: 40.1 % (ref 36.0–46.0)
Hemoglobin: 12.8 g/dL (ref 12.0–15.0)
Immature Granulocytes: 0 %
Lymphocytes Relative: 23 %
Lymphs Abs: 1.9 10*3/uL (ref 0.7–4.0)
MCH: 29.4 pg (ref 26.0–34.0)
MCHC: 31.9 g/dL (ref 30.0–36.0)
MCV: 92.2 fL (ref 80.0–100.0)
Monocytes Absolute: 0.6 10*3/uL (ref 0.1–1.0)
Monocytes Relative: 7 %
Neutro Abs: 5.3 10*3/uL (ref 1.7–7.7)
Neutrophils Relative %: 67 %
Platelet Count: 265 10*3/uL (ref 150–400)
RBC: 4.35 MIL/uL (ref 3.87–5.11)
RDW: 14.1 % (ref 11.5–15.5)
WBC Count: 8 10*3/uL (ref 4.0–10.5)
nRBC: 0 % (ref 0.0–0.2)

## 2018-08-01 ENCOUNTER — Inpatient Hospital Stay: Payer: Medicare Other | Admitting: Internal Medicine

## 2018-08-01 ENCOUNTER — Telehealth: Payer: Self-pay | Admitting: Internal Medicine

## 2018-08-01 NOTE — Telephone Encounter (Signed)
R/s appt per 1/8 sch message - pt is aware of appt date and time

## 2018-08-02 ENCOUNTER — Ambulatory Visit (HOSPITAL_COMMUNITY)
Admission: RE | Admit: 2018-08-02 | Discharge: 2018-08-02 | Disposition: A | Discharge status: Home / Self Care | Payer: Medicare Other | Source: Ambulatory Visit | Attending: Internal Medicine | Admitting: Internal Medicine

## 2018-08-02 DIAGNOSIS — C3490 Malignant neoplasm of unspecified part of unspecified bronchus or lung: Secondary | ICD-10-CM | POA: Diagnosis present

## 2018-08-02 IMAGING — CT CT CHEST W/O CM
2 of 4 series · 15 of 36 positions shown, 18 images · non-contrast
Comparison: [DATE]

CLINICAL DATA: Lung cancer. Status post right upper lobectomy.

EXAM:
CT CHEST WITHOUT CONTRAST
TECHNIQUE: Multidetector CT imaging of the chest was performed following the
standard protocol without IV contrast.

[Series 2: thorax · axial · 0.68mm/px · z∈[-283,-43]mm · 12 of 142 slices shown, 15 images]
[im 11/142  mediastinal]
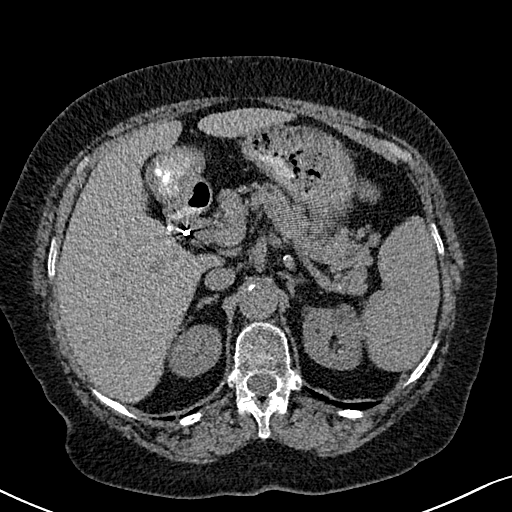
[im 11/142  lung]
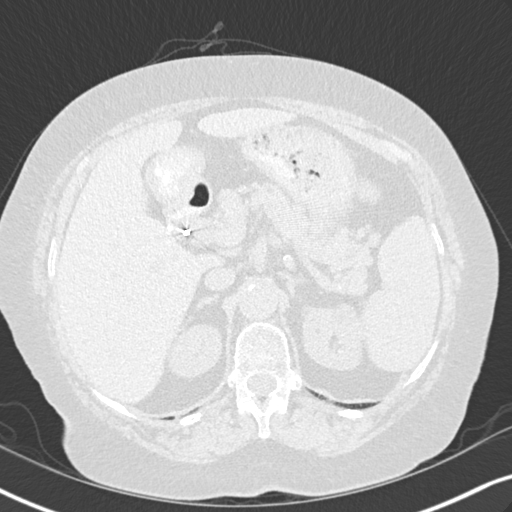
[im 22/142  lung]
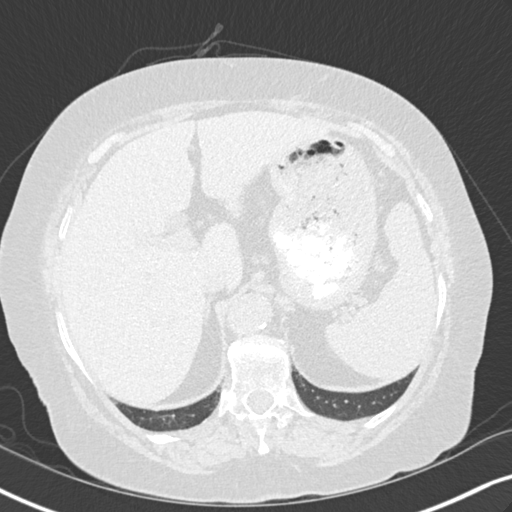
[im 33/142  lung]
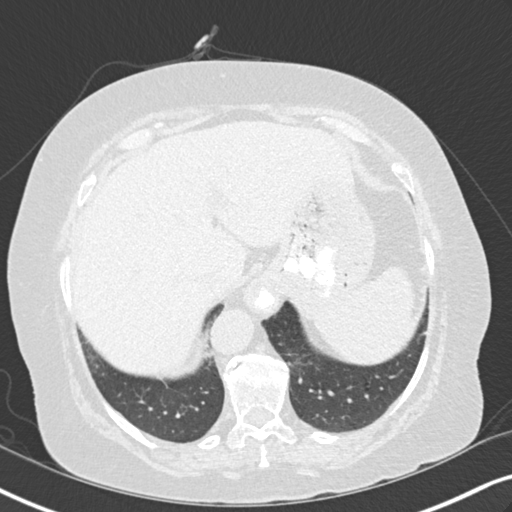
[im 44/142  lung]
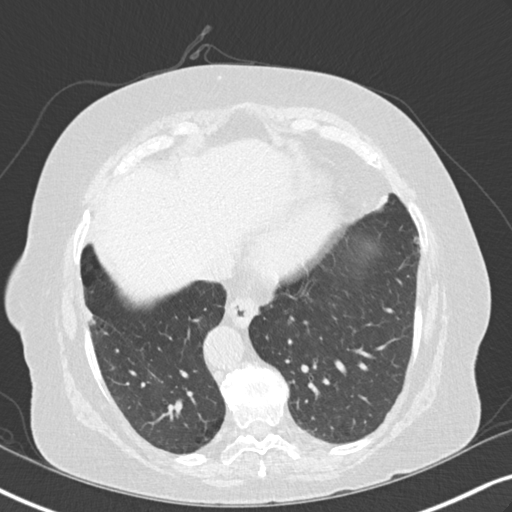
[im 55/142  mediastinal]
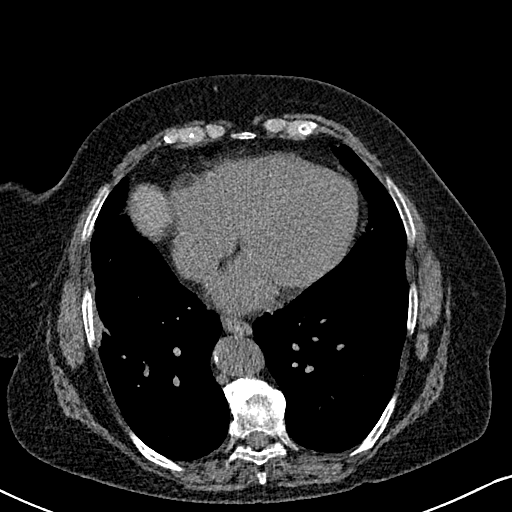
[im 55/142  lung]
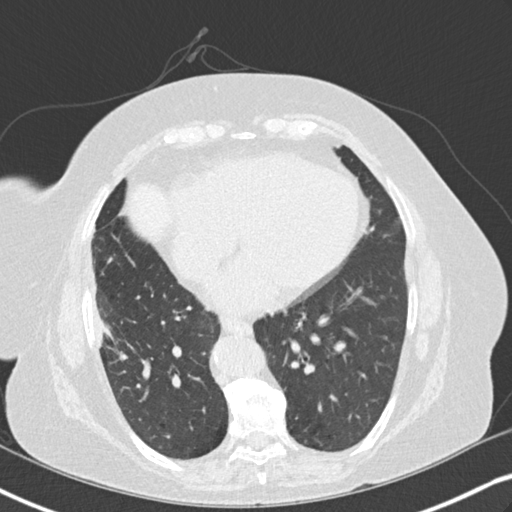
[im 66/142  lung]
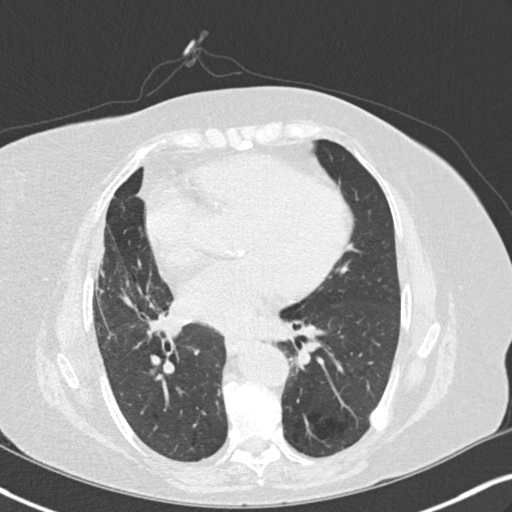
[im 76/142  lung]
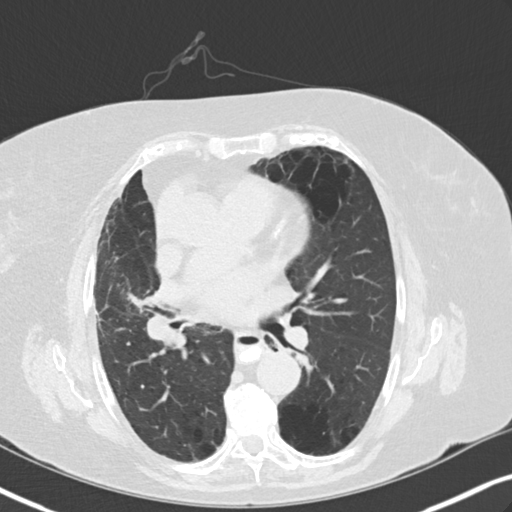
[im 87/142  lung]
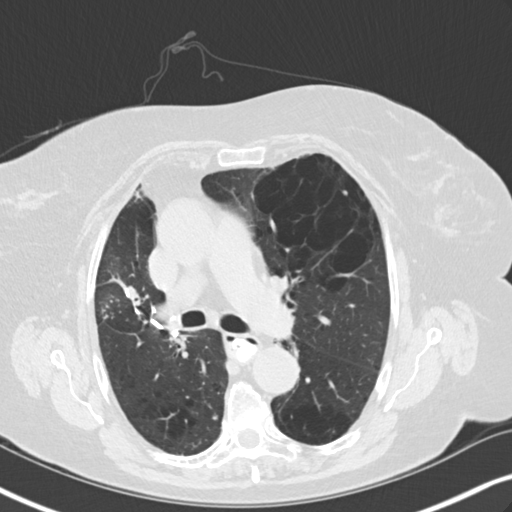
[im 98/142  mediastinal]
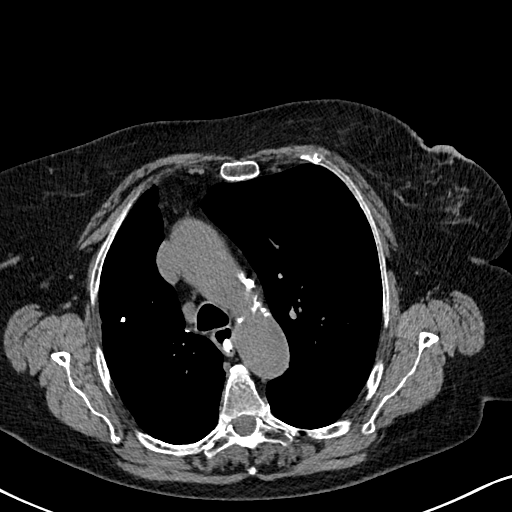
[im 98/142  lung]
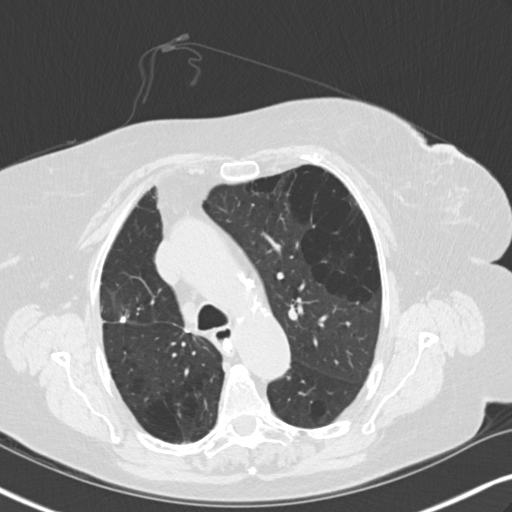
[im 109/142  lung]
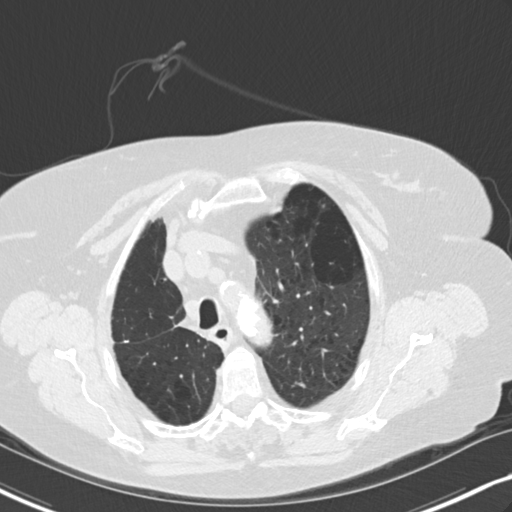
[im 120/142  lung]
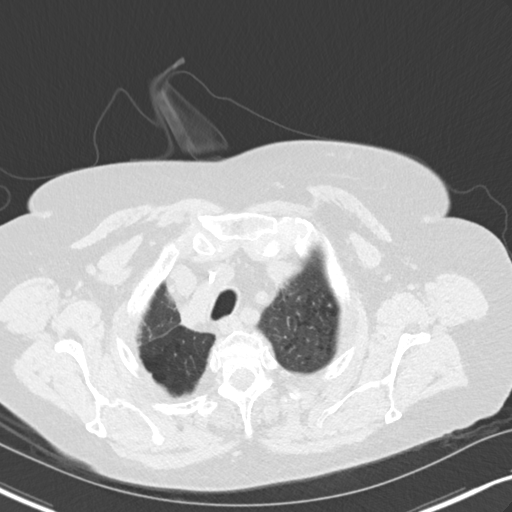
[im 131/142  lung]
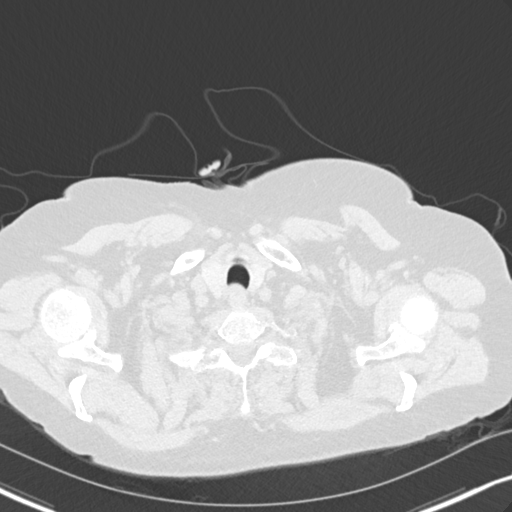

[Series 5: coronal · coronal · 0.57mm/px · 3 of 151 slices shown]
[im 31/151  lung]
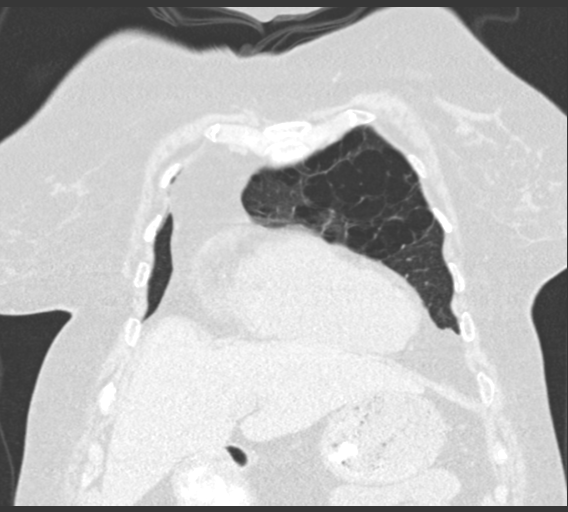
[im 61/151  lung]
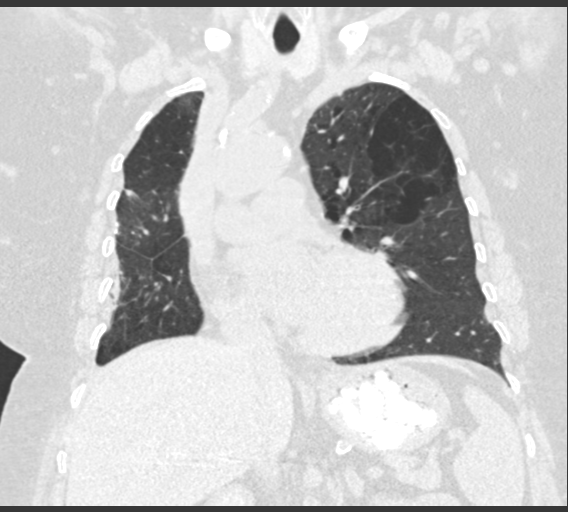
[im 91/151  lung]
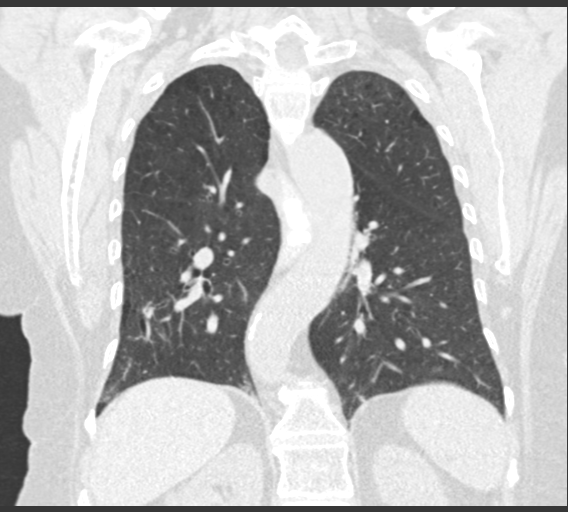

[15 of 36 positions shown; findings below may reference images not displayed]

FINDINGS: Cardiovascular: Heart size mildly enlarged. Coronary artery
calcification is evident. Atherosclerotic calcification is noted in
the wall of the thoracic aorta.

Mediastinum/Nodes: Scattered mediastinal lymph nodes are similar to
prior. Index 8 mm short axis right paratracheal lymph node measured
previously is stable at 8 mm today (34/2). No evidence for gross
hilar lymphadenopathy although assessment is limited by the lack of
intravenous contrast on today's study. Post treatment changes again
noted in the right hilum. Dense food material is identified in the
esophagus, the tiny hiatal hernia, in the proximal stomach. There is
no axillary lymphadenopathy.

Lungs/Pleura: The central tracheobronchial airways are patent.
Volume loss right hemithorax compatible with right upper lobectomy.
Centrilobular paraseptal emphysema noted. Parahilar scarring in the
right lung is similar to prior. Tiny right lung nodules identified
previously are stable. There is new tree-in-bud nodularity posterior
right lung base (104/7). 5 mm left lower lobe nodule (103/7) is
stable as is the 4 mm posterior left lower lobe nodule (96/7). 4 mm
nodule in the anterior left lung (56/7) is stable. No pleural
effusion.

Upper Abdomen: Multiple bilateral renal cysts are incompletely
visualized.

Musculoskeletal: No worrisome lytic or sclerotic osseous
abnormality. Posterior left seventh rib fracture is nonacute but new
since prior study.
IMPRESSION: 1. No new or progressive findings on today's exam to indicate
recurrent or metastatic disease in the thorax..
2. New tree-in-bud nodularity posterior right lung base suggests
atypical infection.
3. Dense, solid food material in the esophagus is concerning for
dysmotility or stricture more so than reflux.
4. Emphysema ([A5]-[A5]).
5.  Aortic Atherosclerois ([A5]-170.0)

## 2018-08-07 ENCOUNTER — Inpatient Hospital Stay (HOSPITAL_BASED_OUTPATIENT_CLINIC_OR_DEPARTMENT_OTHER): Payer: Medicare Other | Admitting: Internal Medicine

## 2018-08-07 ENCOUNTER — Encounter: Payer: Self-pay | Admitting: Internal Medicine

## 2018-08-07 VITALS — BP 153/66 | HR 57 | Temp 97.4°F | Resp 17 | Ht 62.0 in | Wt 165.7 lb

## 2018-08-07 DIAGNOSIS — Z902 Acquired absence of lung [part of]: Secondary | ICD-10-CM | POA: Diagnosis not present

## 2018-08-07 DIAGNOSIS — C3411 Malignant neoplasm of upper lobe, right bronchus or lung: Secondary | ICD-10-CM | POA: Diagnosis not present

## 2018-08-07 DIAGNOSIS — C3490 Malignant neoplasm of unspecified part of unspecified bronchus or lung: Secondary | ICD-10-CM

## 2018-08-07 DIAGNOSIS — I10 Essential (primary) hypertension: Secondary | ICD-10-CM | POA: Diagnosis not present

## 2018-08-07 DIAGNOSIS — C3491 Malignant neoplasm of unspecified part of right bronchus or lung: Secondary | ICD-10-CM

## 2018-08-07 DIAGNOSIS — Z79899 Other long term (current) drug therapy: Secondary | ICD-10-CM | POA: Diagnosis not present

## 2018-08-07 NOTE — Progress Notes (Signed)
[place] [phone]   [phone]  OFFICE PROGRESS NOTE  Wenda Low, MD [address] [place] [address]  DIAGNOSIS: Stage IA (T1a, N0, M0) non-small cell lung cancer, adenocarcinoma presented with right upper lobe pulmonary nodule  PRIOR THERAPY: Status post right upper lobectomy with lymph node dissection on 05/09/2016 by Dr. Roxan Hockey.  CURRENT THERAPY: Observation.  INTERVAL HISTORY: [name] 78 y.o. female returns to the clinic today for 6 months follow-up visit.  The patient is feeling fine today with no concerning complaints.  She denied having any significant fatigue or weakness.  She lost her son last year for cancer.  She denied having any current chest pain, shortness of breath, cough or hemoptysis.  She denied having any fever or chills.  She has no nausea, vomiting, diarrhea or constipation.  She had repeat CT scan of the chest performed recently and she is here for evaluation and discussion of her scan results.  MEDICAL HISTORY: Past Medical History:  Diagnosis Date  . Adenocarcinoma of right lung, stage 1 ([place]) 07/28/2016  . Anxiety   . Cancer ([place])    uterine  . COPD (chronic obstructive pulmonary disease) ([place])   . Cyst    left side of neck  . Depression   . GERD (gastroesophageal reflux disease)   . Heart murmur    per patient  . Hyperlipidemia   . Hypertension   . Low back pain   . Lumbar back pain   . Osteoarthritis   . Osteoporosis     ALLERGIES:  is allergic to boniva [ibandronate sodium] and lyrica [pregabalin].  MEDICATIONS:  Current Outpatient Medications  Medication Sig Dispense Refill  . albuterol (PROVENTIL HFA;VENTOLIN HFA) 108 (90 BASE) MCG/ACT inhaler Inhale 2 puffs into the lungs every 6 (six) hours as needed. (Patient taking differently: Inhale 2 puffs into the lungs every 4 (four) hours as needed for wheezing or shortness of breath. ) 18 g 1  . albuterol (PROVENTIL)  (2.5 MG/3ML) 0.083% nebulizer solution Take 3 mLs (2.5 mg total) by nebulization every 2 (two) hours as needed for wheezing or shortness of breath. 75 mL 12  . aluminum-magnesium hydroxide-simethicone (MAALOX) 751-025-85 MG/5ML SUSP Take 30 mLs by mouth as needed (indigestion).    Marland Kitchen amLODipine (NORVASC) 5 MG tablet Take 5 mg by mouth daily.    Marland Kitchen atorvastatin (LIPITOR) 20 MG tablet Take 20 mg by mouth daily.     . diazepam (VALIUM) 5 MG tablet Take 2.5 mg by mouth every 8 (eight) hours as needed for anxiety (for nerves). Must last 30 days. Filled 11-28-17    . Fluticasone-Salmeterol (ADVAIR DISKUS) 250-50 MCG/DOSE AEPB Inhale 1 puff into the lungs every 12 (twelve) hours. 60 each 0  . gabapentin (NEURONTIN) 400 MG capsule Take 1 capsule (400 mg total) by mouth 4 (four) times daily. 120 capsule 2  . Glucosamine HCl (GLUCOSAMINE PO) Take 1 tablet by mouth daily.    Marland Kitchen lisinopril (PRINIVIL,ZESTRIL) 10 MG tablet Take 1 tablet (10 mg total) by mouth every morning. 30 tablet 0  . Magnesium 250 MG TABS Take 250 mg by mouth daily.    . meloxicam (MOBIC) 7.5 MG tablet Take 7.5 mg by mouth daily.    . Menthol, Topical Analgesic, (BERRI-FREEZ PAIN RELIEVING) 10 % LIQD Apply 1 application topically as needed (pain).    Marland Kitchen metoCLOPramide (REGLAN) 5 MG tablet Take 5 mg by mouth 2 (two) times daily.     Marland Kitchen  metoprolol tartrate (LOPRESSOR) 25 MG tablet Take 0.5 tablets (12.5 mg total) by mouth 2 (two) times daily. 60 tablet 0  . Multiple Vitamins-Minerals (WOMENS 50+ ADVANCED PO) Take 1 tablet by mouth daily.    . NONFORMULARY OR COMPOUNDED ITEM Sig: Apply 1-2 gm(s) (2-4 pumps) to affected area, 3-4 times/day. (1 pump = 0.5 gm) 1 each 2  . omega-3 acid ethyl esters (LOVAZA) 1 g capsule Take by mouth 2 (two) times daily.    . pantoprazole (PROTONIX) 40 MG tablet Take 1 tablet (40 mg total) by mouth daily at 6 (six) AM. 30 tablet 0  . raloxifene (EVISTA) 60 MG tablet Take 60 mg by mouth daily.    . sertraline (ZOLOFT)  100 MG tablet Take 100 mg by mouth daily.    Marland Kitchen tiotropium (SPIRIVA) 18 MCG inhalation capsule Place 18 mcg into inhaler and inhale daily.      . traMADol (ULTRAM) 50 MG tablet Take 50 mg by mouth 3 (three) times daily as needed.     No current facility-administered medications for this visit.     SURGICAL HISTORY:  Past Surgical History:  Procedure Laterality Date  . ANKLE SURGERY Right    horse accident  . APPENDECTOMY    . BACK SURGERY    . CHOLECYSTECTOMY    . EYE SURGERY     lense implant  . FRACTURE SURGERY    . HIP SURGERY  01/2010   Left Hip   . INNER EAR SURGERY Bilateral 1970   related to severe ear infections  . KNEE SURGERY     Right knee  . LEFT HEART CATH AND CORONARY ANGIOGRAPHY N/A 12/11/2017   Procedure: LEFT HEART CATH AND CORONARY ANGIOGRAPHY;  Surgeon: Jettie Booze, MD;  Location: [facility];  Service: Cardiovascular;  Laterality: N/A;  . LOBECTOMY Right 05/09/2016   Procedure: RIGHT UPPER LOBECTOMY;  Surgeon: [name], MD;  Location: [facility];  Service: Thoracic;  Laterality: Right;  . MASS EXCISION  08/25/2011   Procedure: EXCISION MASS;  Surgeon: Harl Bowie, MD;  Location: [facility];  Service: General;  Laterality: N/A;  excision left neck mass  . TOTAL ABDOMINAL HYSTERECTOMY    . VIDEO ASSISTED THORACOSCOPY (VATS)/WEDGE RESECTION Right 05/09/2016   Procedure: VIDEO ASSISTED THORACOSCOPY (VATS)/WEDGE RESECTION;  Surgeon: [name], MD;  Location: [facility];  Service: Thoracic;  Laterality: Right;    REVIEW OF SYSTEMS:  A comprehensive review of systems was negative except for: Constitutional: positive for fatigue   PHYSICAL EXAMINATION: General appearance: alert, cooperative and no distress Head: Normocephalic, without obvious abnormality, atraumatic Neck: no adenopathy, no JVD, supple, symmetrical, trachea midline and thyroid not enlarged, symmetric, no tenderness/mass/nodules Lymph nodes: Cervical,  supraclavicular, and axillary nodes normal. Resp: clear to auscultation bilaterally Back: symmetric, no curvature. ROM normal. No CVA tenderness. Cardio: regular rate and rhythm, S1, S2 normal, no murmur, click, rub or gallop GI: soft, non-tender; bowel sounds normal; no masses,  no organomegaly Extremities: extremities normal, atraumatic, no cyanosis or edema  ECOG PERFORMANCE STATUS: 1 - Symptomatic but completely ambulatory  Blood pressure (!) 166/57, pulse (!) 57, temperature (!) 97.4 F (36.3 C), temperature source Oral, resp. rate 17, height 5\' 2"  (1.575 m), weight 165 lb 11.2 oz (75.2 kg), SpO2 98 %.  LABORATORY DATA: Lab Results  Component Value Date   WBC 8.0 07/30/2018   HGB 12.8 07/30/2018   HCT 40.1 07/30/2018   MCV 92.2 07/30/2018   PLT 265  07/30/2018      Chemistry      Component Value Date/Time   NA 142 07/30/2018 1123   NA 141 01/17/2018 0853   NA 140 07/27/2017 0939   K 4.3 07/30/2018 1123   K 3.7 07/27/2017 0939   CL 107 07/30/2018 1123   CO2 28 07/30/2018 1123   CO2 28 07/27/2017 0939   BUN 14 07/30/2018 1123   BUN 9 01/17/2018 0853   BUN 17.0 07/27/2017 0939   CREATININE 1.16 (H) 07/30/2018 1123   CREATININE 1.3 (H) 07/27/2017 0939      Component Value Date/Time   CALCIUM 8.8 (L) 07/30/2018 1123   CALCIUM 8.8 07/27/2017 0939   ALKPHOS 61 07/30/2018 1123   ALKPHOS 52 07/27/2017 0939   AST 18 07/30/2018 1123   AST 15 07/27/2017 0939   ALT 13 07/30/2018 1123   ALT 15 07/27/2017 0939   BILITOT 0.5 07/30/2018 1123   BILITOT 0.38 07/27/2017 0939       RADIOGRAPHIC STUDIES: Ct Chest Wo Contrast  Result Date: 08/02/2018 CLINICAL DATA:  Lung cancer. Status post right upper lobectomy. EXAM: CT CHEST WITHOUT CONTRAST TECHNIQUE: Multidetector CT imaging of the chest was performed following the standard protocol without IV contrast. COMPARISON:  01/26/2018 FINDINGS: Cardiovascular: Heart size mildly enlarged. Coronary artery calcification is evident.  Atherosclerotic calcification is noted in the wall of the thoracic aorta. Mediastinum/Nodes: Scattered mediastinal lymph nodes are similar to prior. Index 8 mm short axis right paratracheal lymph node measured previously is stable at 8 mm today (34/2). No evidence for gross hilar lymphadenopathy although assessment is limited by the lack of intravenous contrast on today's study. Post treatment changes again noted in the right hilum. Dense food material is identified in the esophagus, the tiny hiatal hernia, in the proximal stomach. There is no axillary lymphadenopathy. Lungs/Pleura: The central tracheobronchial airways are patent. Volume loss right hemithorax compatible with right upper lobectomy. Centrilobular paraseptal emphysema noted. Parahilar scarring in the right lung is similar to prior. Tiny right lung nodules identified previously are stable. There is new tree-in-bud nodularity posterior right lung base (104/7). 5 mm left lower lobe nodule (103/7) is stable as is the 4 mm posterior left lower lobe nodule (96/7). 4 mm nodule in the anterior left lung (56/7) is stable. No pleural effusion. Upper Abdomen: Multiple bilateral renal cysts are incompletely visualized. Musculoskeletal: No worrisome lytic or sclerotic osseous abnormality. Posterior left seventh rib fracture is nonacute but new since prior study. IMPRESSION: 1. No new or progressive findings on today's exam to indicate recurrent or metastatic disease in the thorax. 2. New tree-in-bud nodularity posterior right lung base suggests atypical infection. 3. Dense, solid food material in the esophagus is concerning for dysmotility or stricture more so than reflux. 4. Emphysema (ICD10-J43.9). 5.  Aortic Atherosclerois (ICD10-170.0) Electronically Signed   By: Misty Stanley M.D.   On: 08/02/2018 15:21    ASSESSMENT AND PLAN: This is a very pleasant 78 years old white female with a stage IA non-small cell lung cancer status post right upper lobectomy with  lymph node dissection and currently on observation.  The patient has no concerning complaints except for the baseline shortness of breath and intermittent right-sided chest pain from the surgical scar. The patient had repeat CT scan of the chest performed recently.  I personally and independently reviewed the scans and discussed the results with the patient. Had a scan showed no concerning findings for disease recurrence or progression. I recommended for the patient to continue  on observation with repeat CT scan of the chest in 6 months. The patient was advised to call immediately if she has any concerning symptoms in the interval. The patient voices understanding of current disease status and treatment options and is in agreement with the current care plan. All questions were answered. The patient knows to call the clinic with any problems, questions or concerns. We can certainly see the patient much sooner if necessary.  I spent 10 minutes counseling the patient face to face. The total time spent in the appointment was 15 minutes.  Disclaimer: This note was dictated with voice recognition software. Similar sounding words can inadvertently be transcribed and may not be corrected upon review.

## 2018-08-08 ENCOUNTER — Telehealth: Payer: Self-pay | Admitting: Internal Medicine

## 2018-08-08 NOTE — Telephone Encounter (Signed)
Scheduled appt per 1/14 los - sent reminder letter in the mail with appt date and time

## 2018-08-21 ENCOUNTER — Encounter: Payer: Medicare Other | Admitting: Nurse Practitioner

## 2018-09-18 ENCOUNTER — Ambulatory Visit
Admission: RE | Admit: 2018-09-18 | Discharge: 2018-09-18 | Disposition: A | Discharge status: Home / Self Care | Payer: Medicare Other | Source: Ambulatory Visit | Attending: Internal Medicine | Admitting: Internal Medicine

## 2018-09-18 ENCOUNTER — Other Ambulatory Visit: Payer: Self-pay | Admitting: Internal Medicine

## 2018-09-18 DIAGNOSIS — M25532 Pain in left wrist: Secondary | ICD-10-CM

## 2018-09-18 IMAGING — CR DG WRIST 2V*L*
2 series · 2 of 2 positions shown · non-contrast
Comparison: [DATE]

CLINICAL DATA: Chronic wrist pain for several months, no known
injury, initial encounter

EXAM:
LEFT WRIST - 2 VIEW

[x wrist pa left]
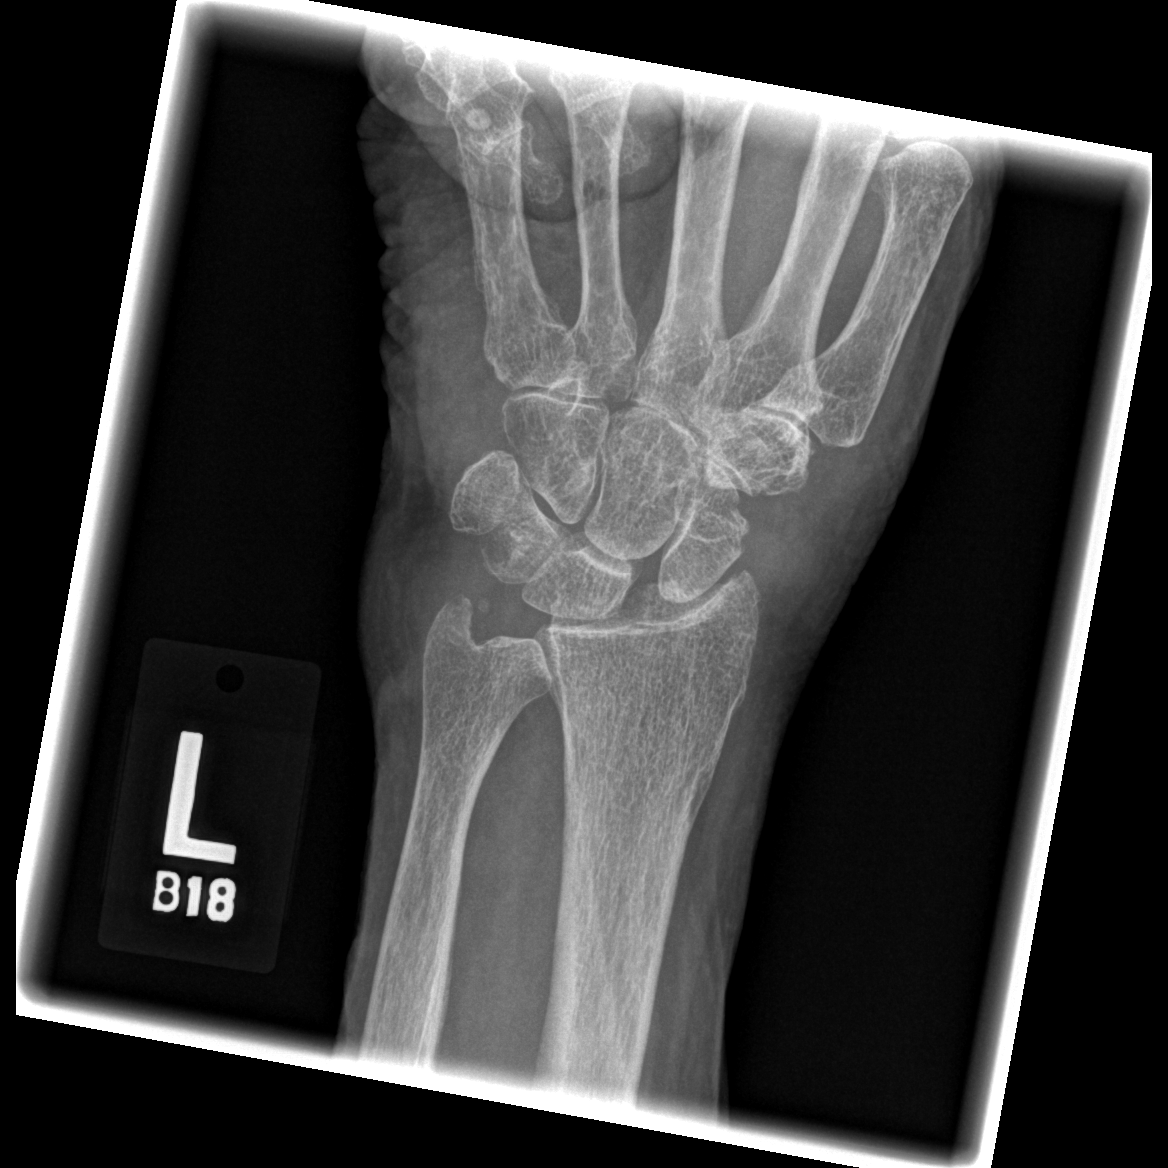

[x wrist lat left]
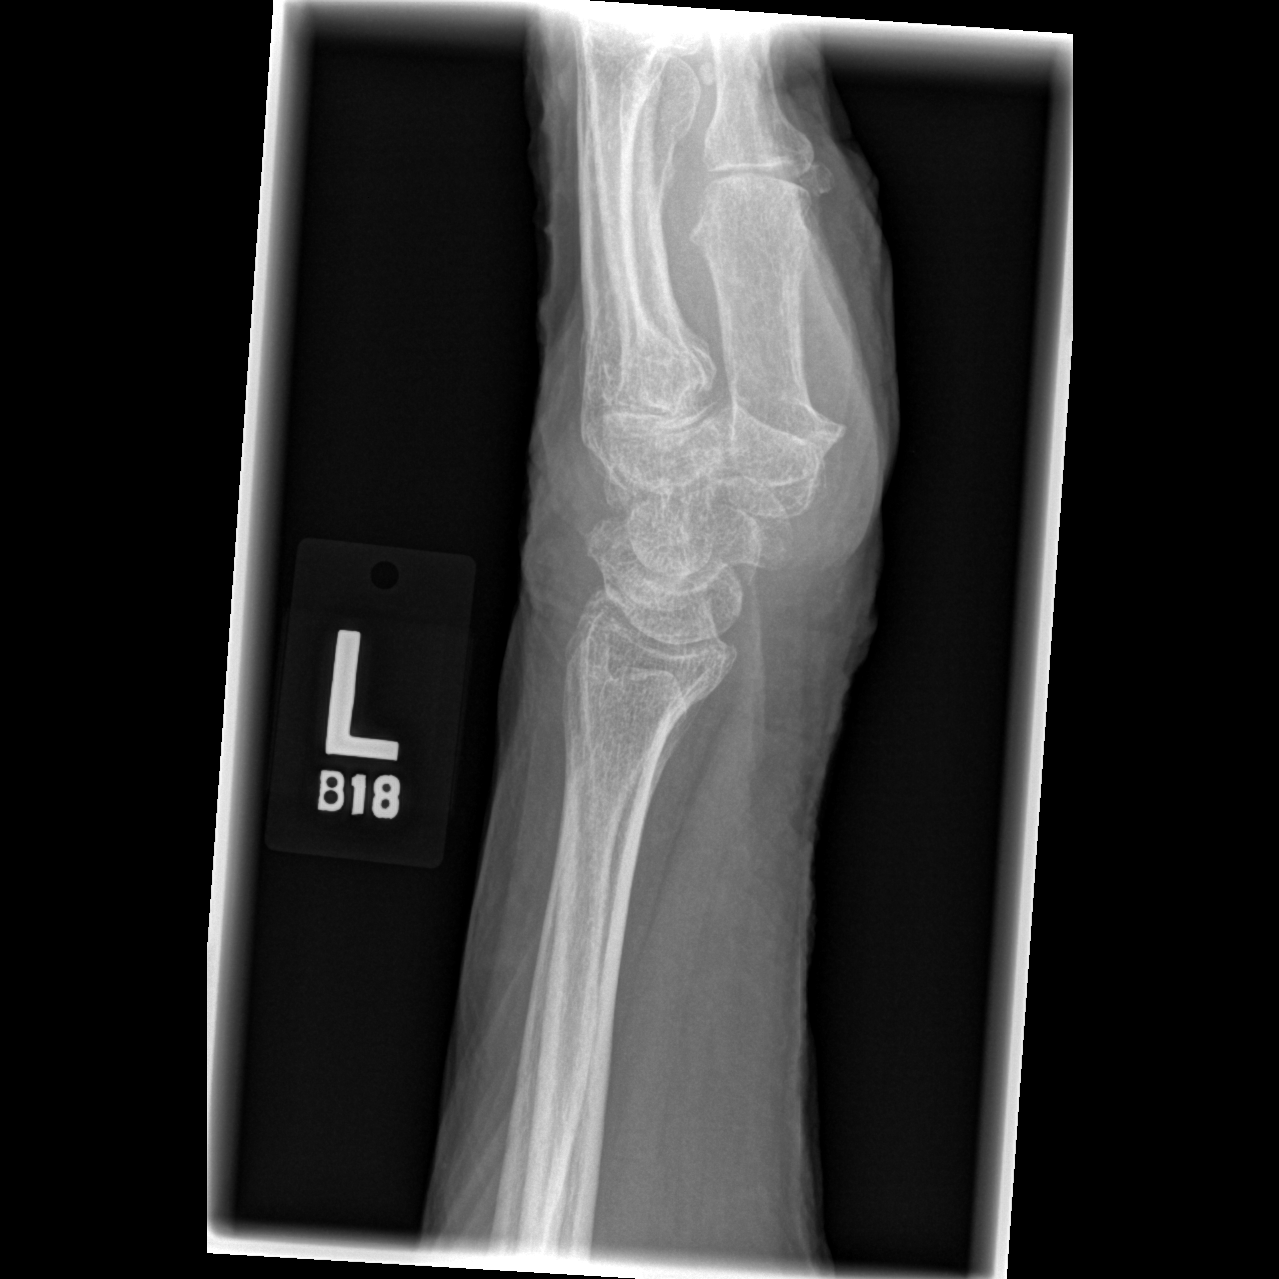

[2 of 2 positions shown; findings below may reference images not displayed]

FINDINGS: Degenerative changes of the first CMC joint are again identified.
There is increased widening of the scapholunate space which may be
related to ligamentous injury of uncertain chronicity. No acute
fracture or dislocation is seen. No soft tissue abnormality is
noted.
IMPRESSION: Increased widening of the scapholunate space as described. This is
likely related to underlying ligamentous injury.

## 2018-10-10 ENCOUNTER — Other Ambulatory Visit: Payer: Self-pay | Admitting: Internal Medicine

## 2018-10-10 ENCOUNTER — Ambulatory Visit
Admission: RE | Admit: 2018-10-10 | Discharge: 2018-10-10 | Disposition: A | Discharge status: Home / Self Care | Payer: Medicare Other | Source: Ambulatory Visit | Attending: Internal Medicine | Admitting: Internal Medicine

## 2018-10-10 DIAGNOSIS — M25511 Pain in right shoulder: Secondary | ICD-10-CM

## 2018-10-10 DIAGNOSIS — S0083XA Contusion of other part of head, initial encounter: Secondary | ICD-10-CM

## 2018-10-10 DIAGNOSIS — R0789 Other chest pain: Secondary | ICD-10-CM

## 2018-10-10 DIAGNOSIS — M25532 Pain in left wrist: Secondary | ICD-10-CM

## 2018-10-10 DIAGNOSIS — R0781 Pleurodynia: Secondary | ICD-10-CM

## 2018-10-10 IMAGING — CR RIGHT SHOULDER - 2+ VIEW
2 series · 2 of 2 positions shown · non-contrast
Comparison: None.

CLINICAL DATA: Fall 3 days ago injuring right shoulder. Pain and
decreased range of motion.

EXAM:
RIGHT SHOULDER - 2+ VIEW

[w shoulder ap internal righ]
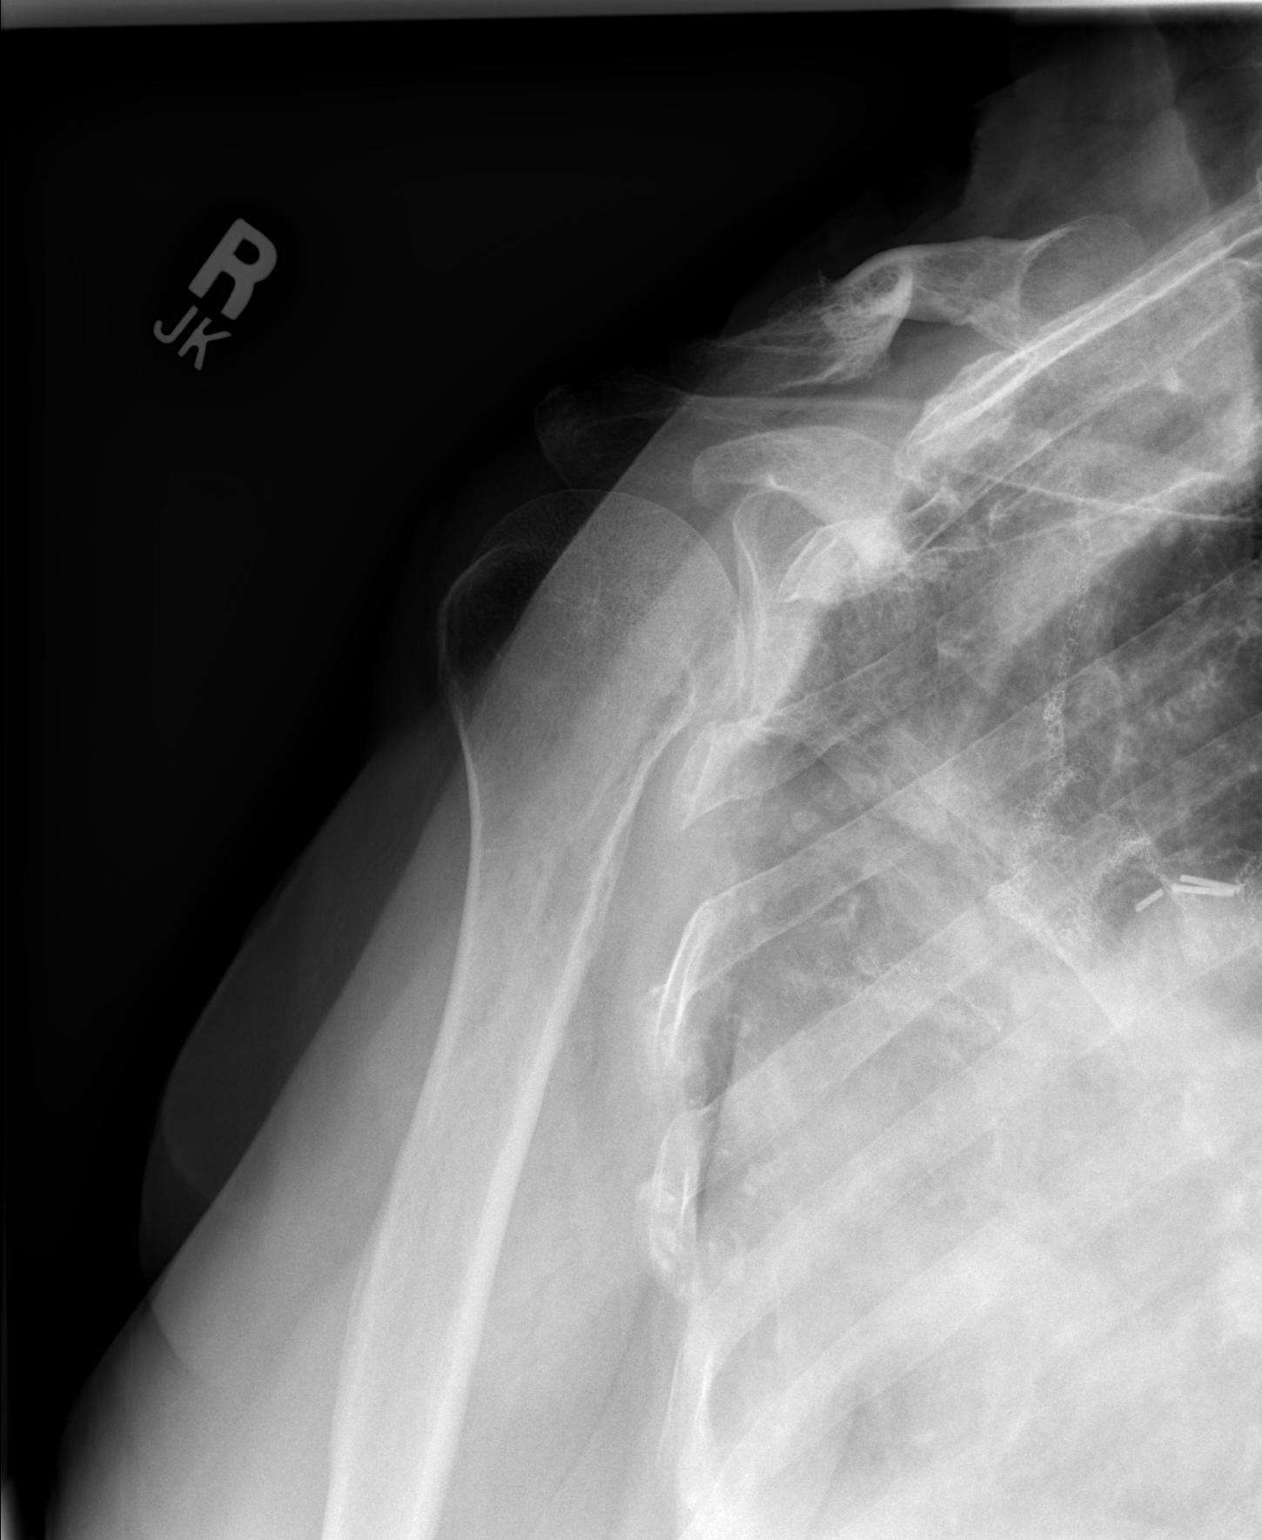

[w shoulder y view right *]
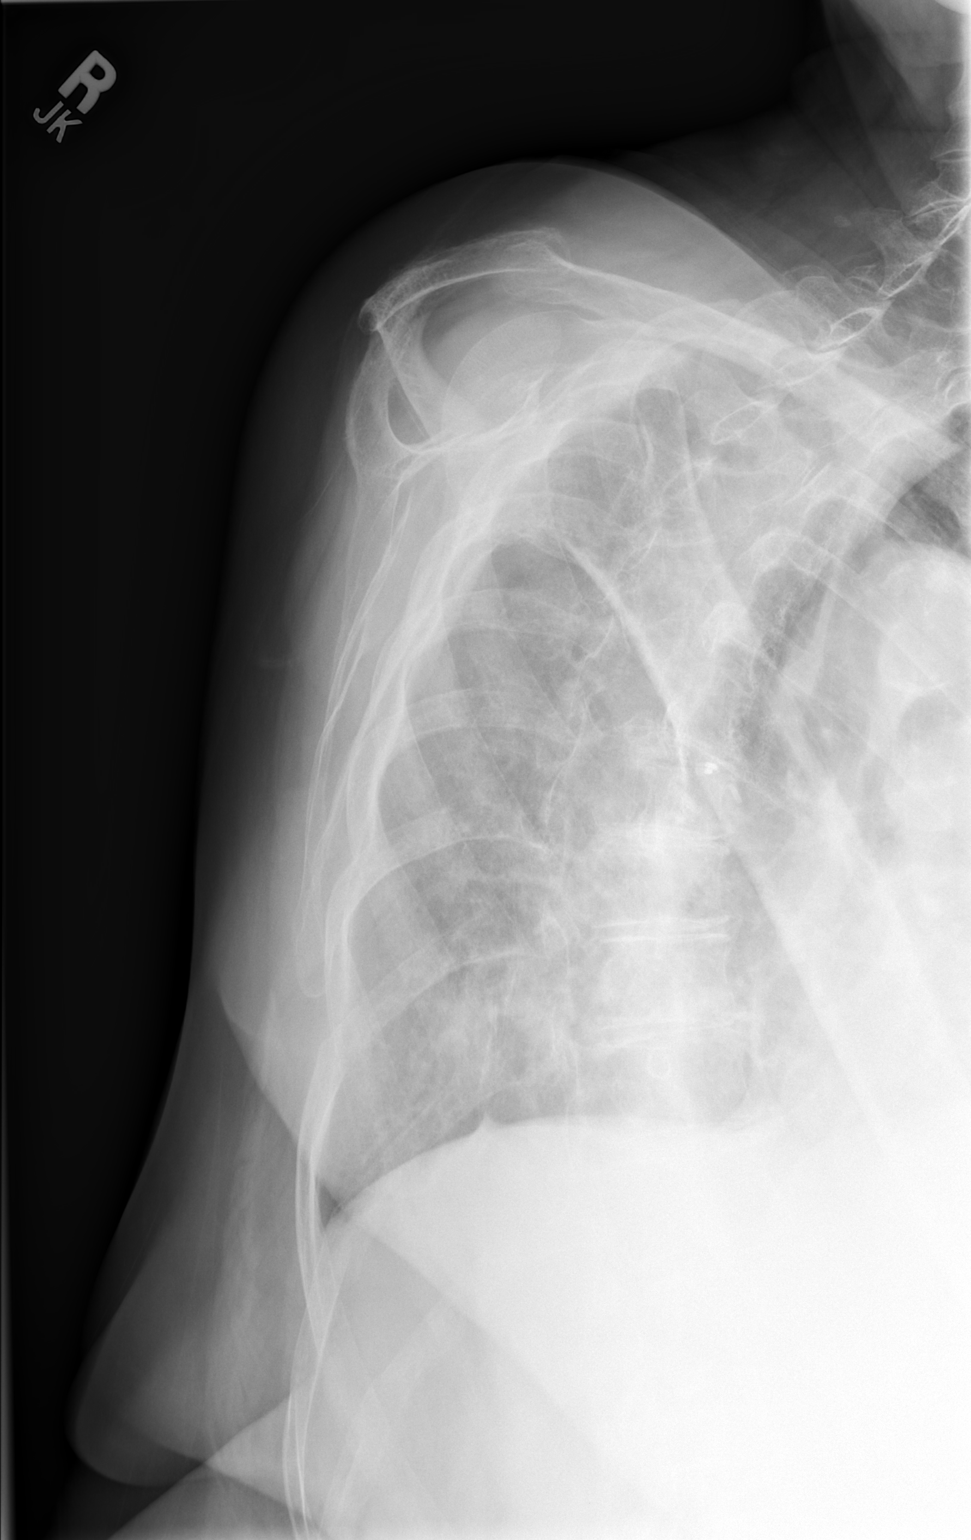

[2 of 2 positions shown; findings below may reference images not displayed]

FINDINGS: No fracture or bone lesion. Glenohumeral and AC joints are normally
aligned.

Skeletal structures are demineralized.

Postsurgical changes are noted in the visualized right lung.
IMPRESSION: 1. No fracture or dislocation.

## 2018-10-10 IMAGING — CR LEFT WRIST - COMPLETE 3+ VIEW
4 series · 4 of 4 positions shown · non-contrast
Comparison: [DATE]

CLINICAL DATA: Fell 3 days ago. Left wrist injury and persistent
pain.

EXAM:
LEFT WRIST - COMPLETE 3+ VIEW

[x wrist pa left]
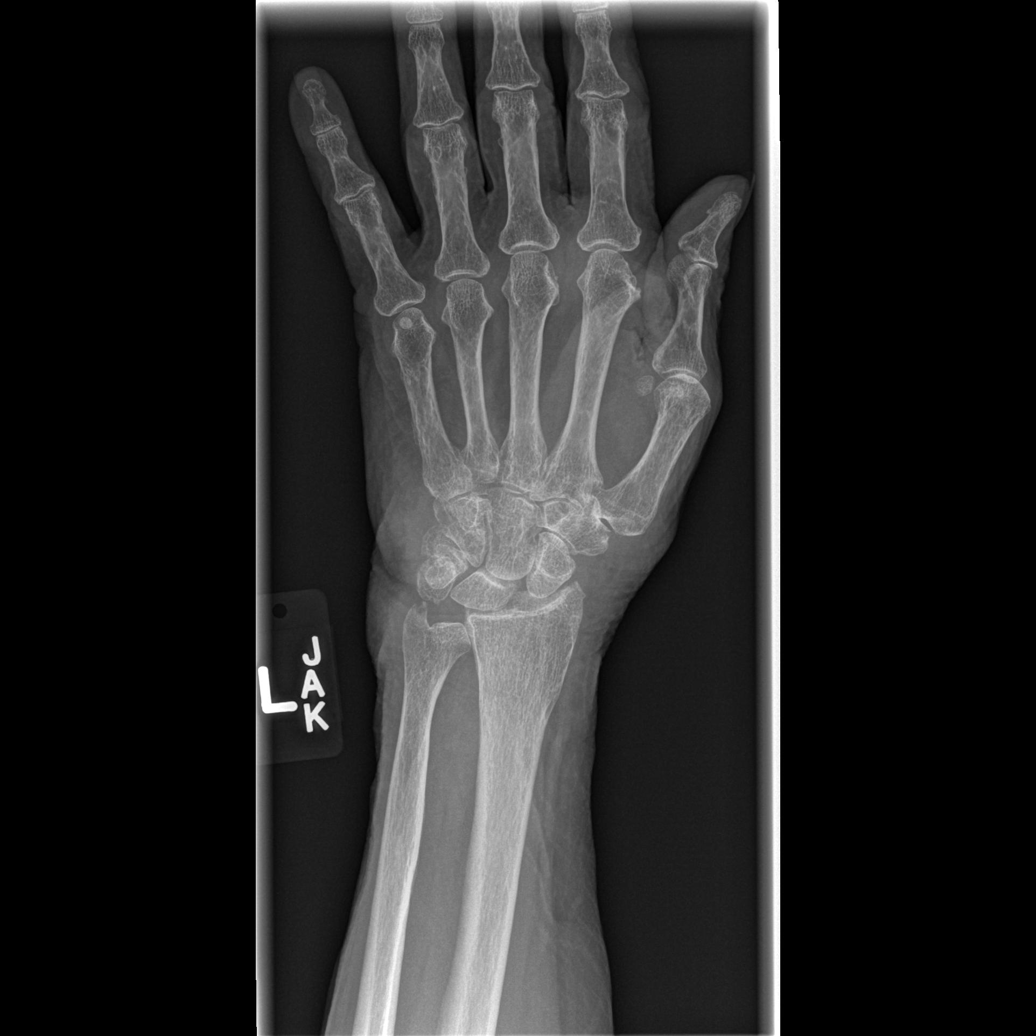

[x wrist obl left]
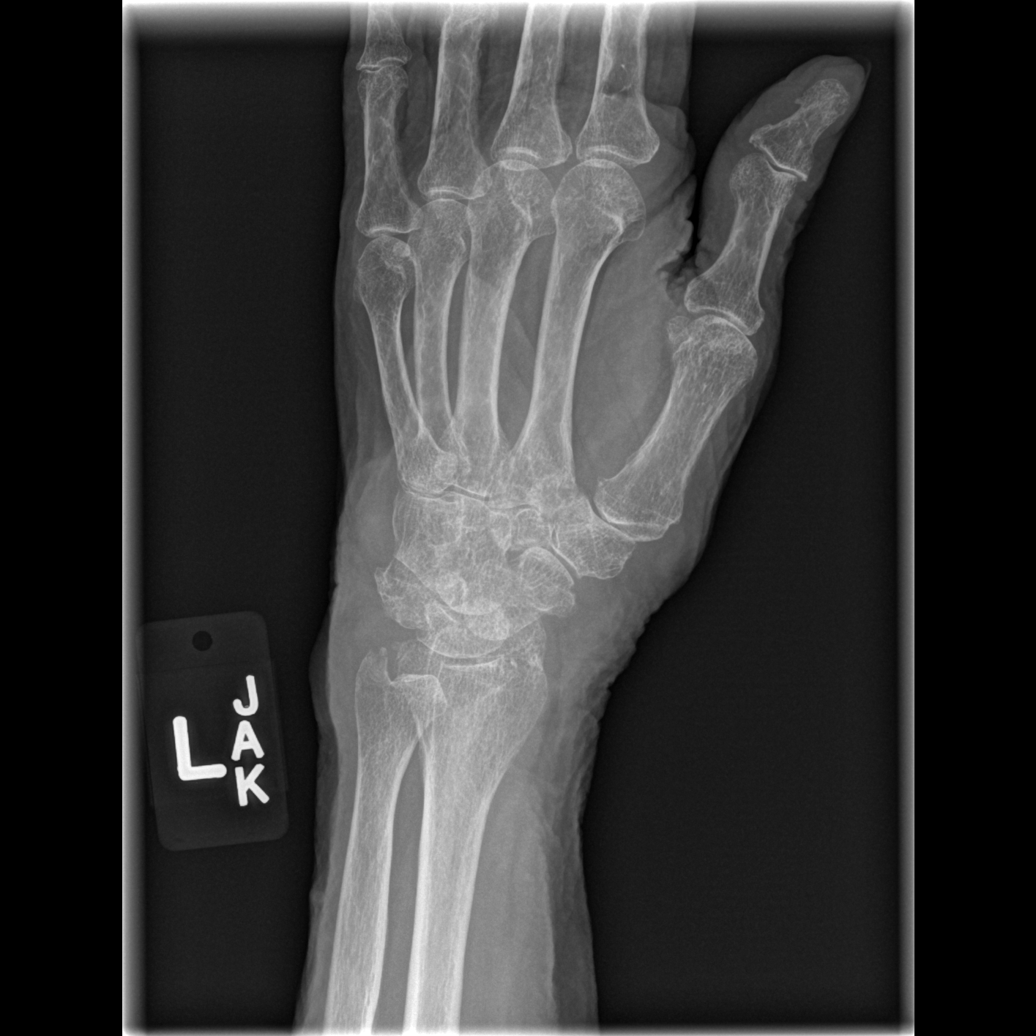

[x wrist lat left]
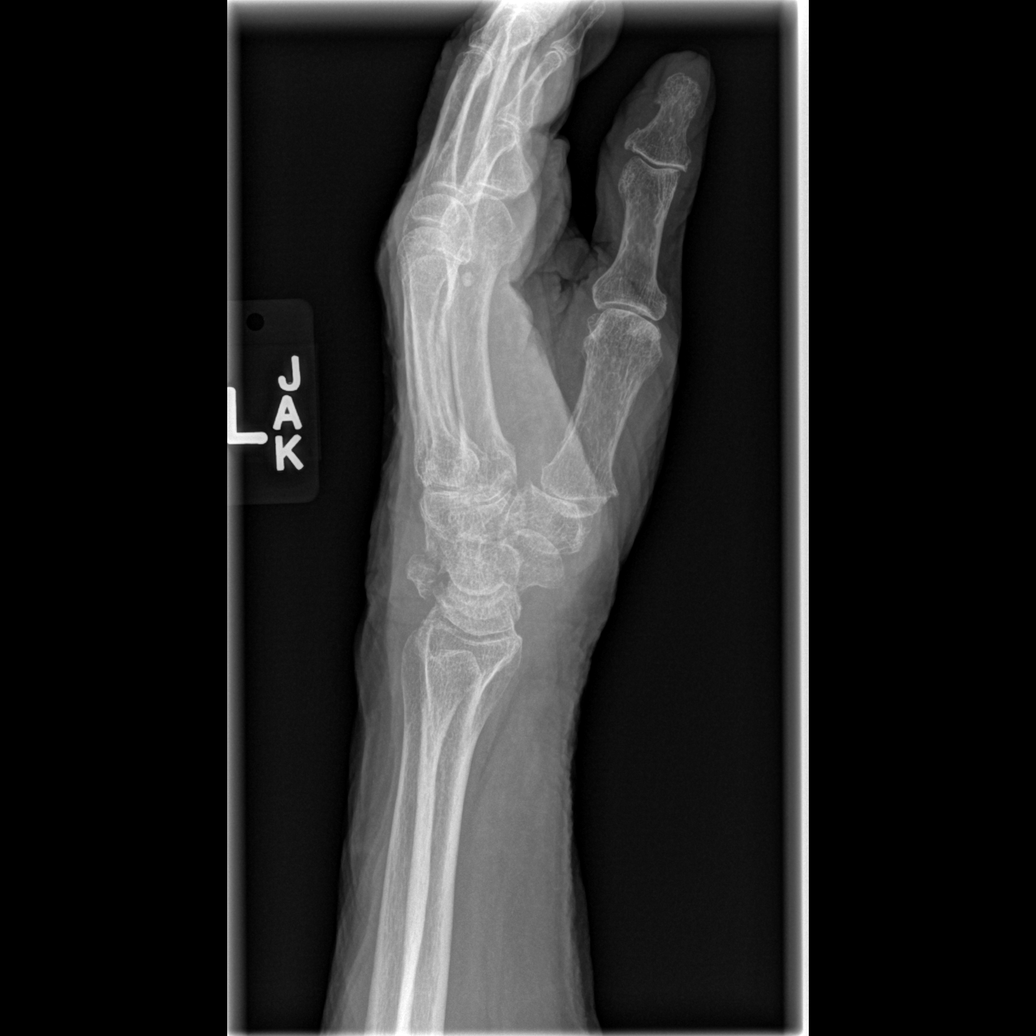

[x navicular]
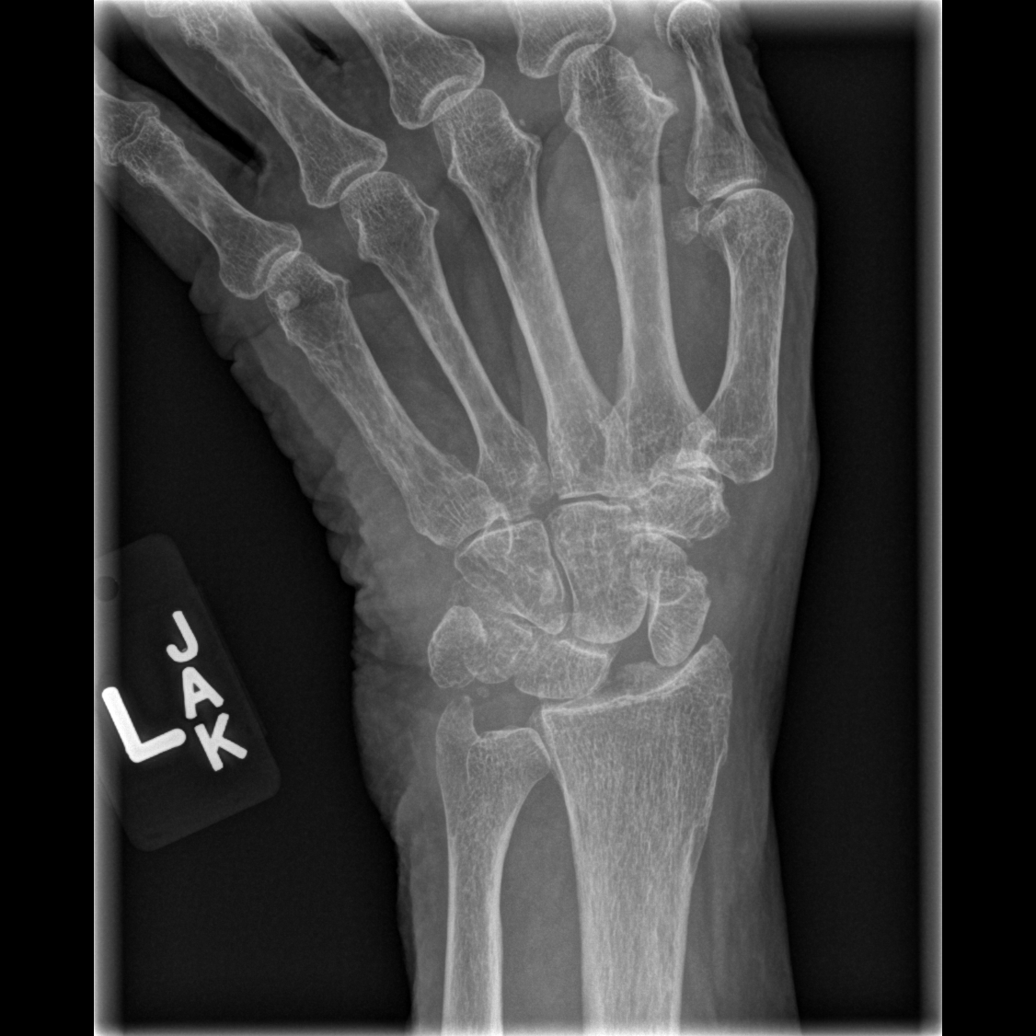

[4 of 4 positions shown; findings below may reference images not displayed]

FINDINGS: On the lateral view, there is a triquetrum fracture, mildly
displaced posteriorly by 2-3 mm. This is new compared to the prior
exam.

Chronic widening of the scapholunate interval is stable from the
prior exam. There are no other fractures. Remaining joints are
normally aligned.

Bones are diffusely demineralized.

There is soft tissue swelling most evident dorsally.
IMPRESSION: 1. Mildly displaced fracture of the triquetrum seen on the lateral
view dorsally.
2. No other fractures.
3. Chronic widening of the scapholunate interval consistent with
scapholunate ligament disruption.

## 2018-10-10 IMAGING — CR RIGHT RIBS AND CHEST - 3+ VIEW
3 series · 3 of 3 positions shown · non-contrast
Comparison: Chest CT dated [DATE]. Chest radiographs dated
[DATE].

CLINICAL DATA: Right anterior upper rib pain following a fall 3
days ago.

EXAM:
RIGHT RIBS AND CHEST - 3+ VIEW

[w chest pa]
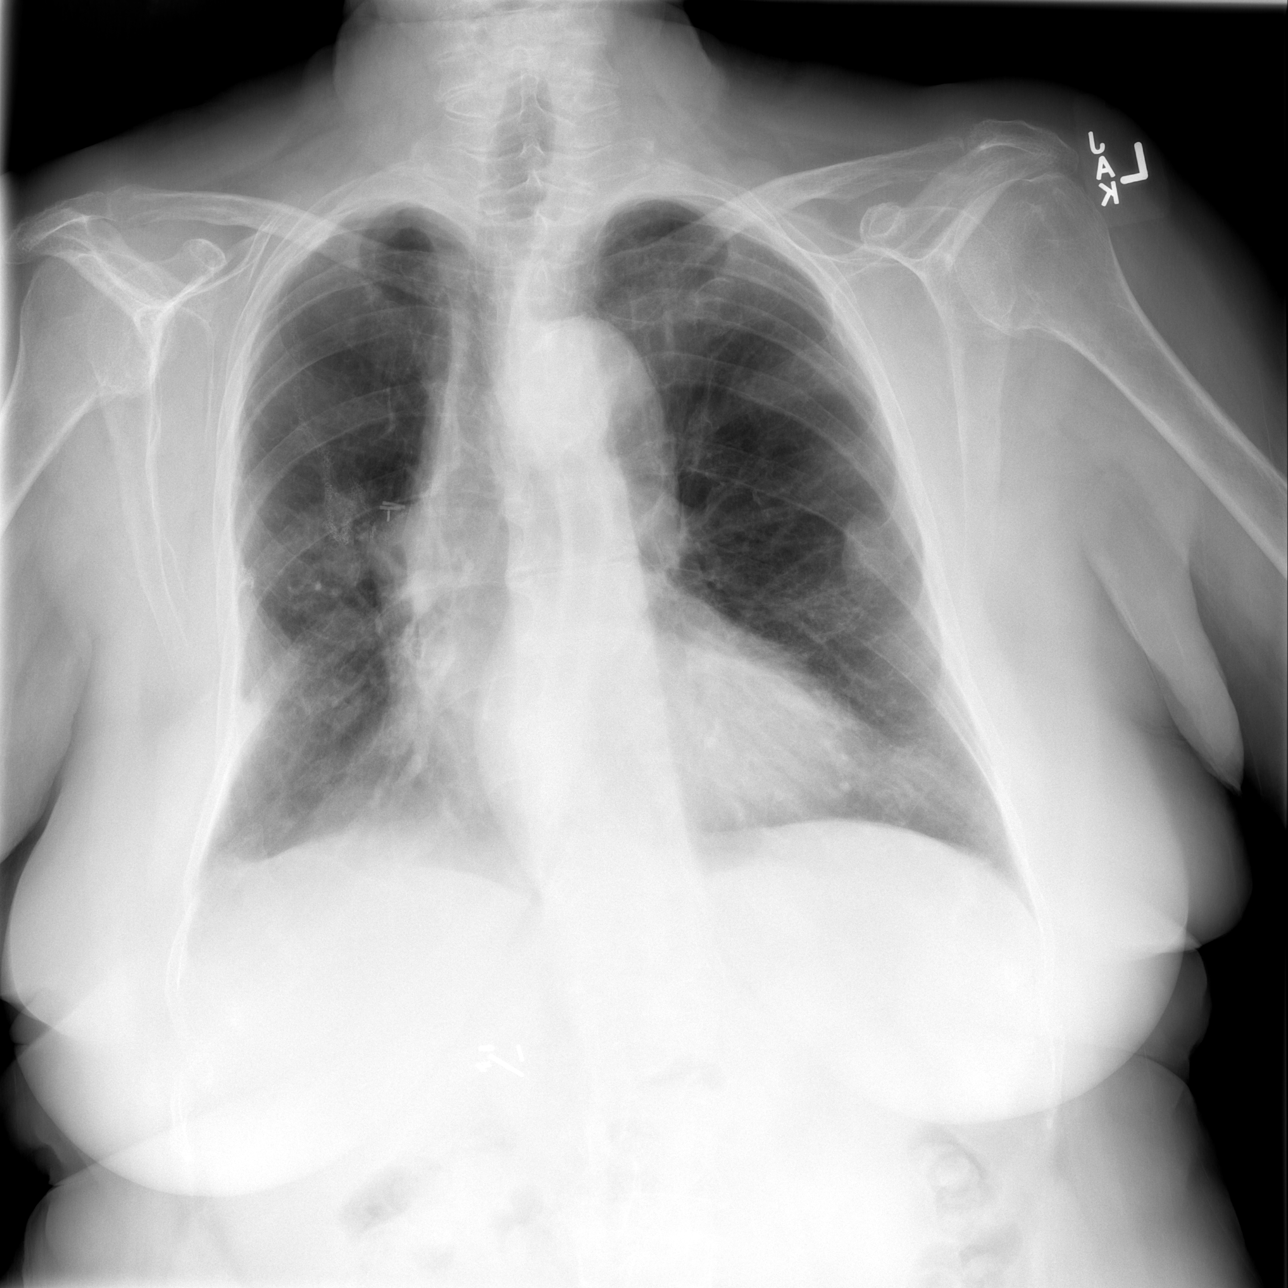

[w ribs ap/pa upper right *]
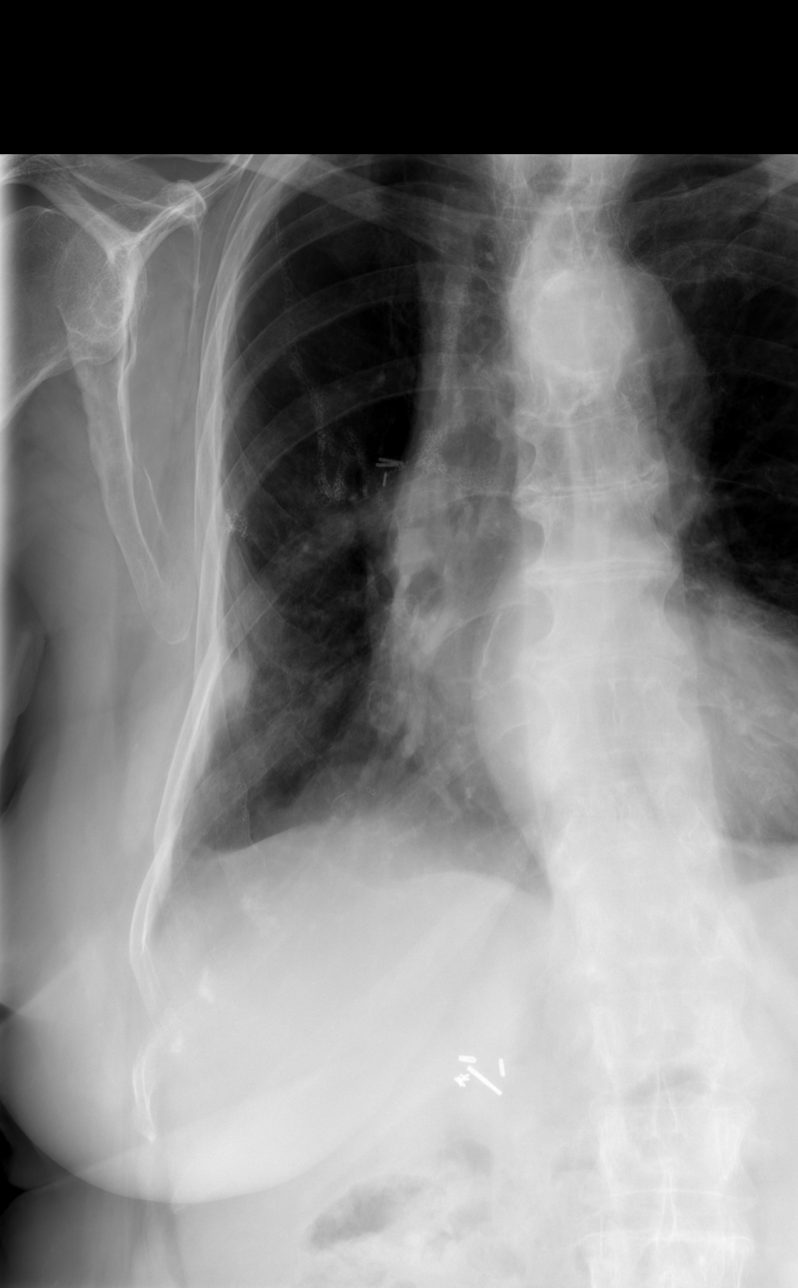

[w ribs oblique right]
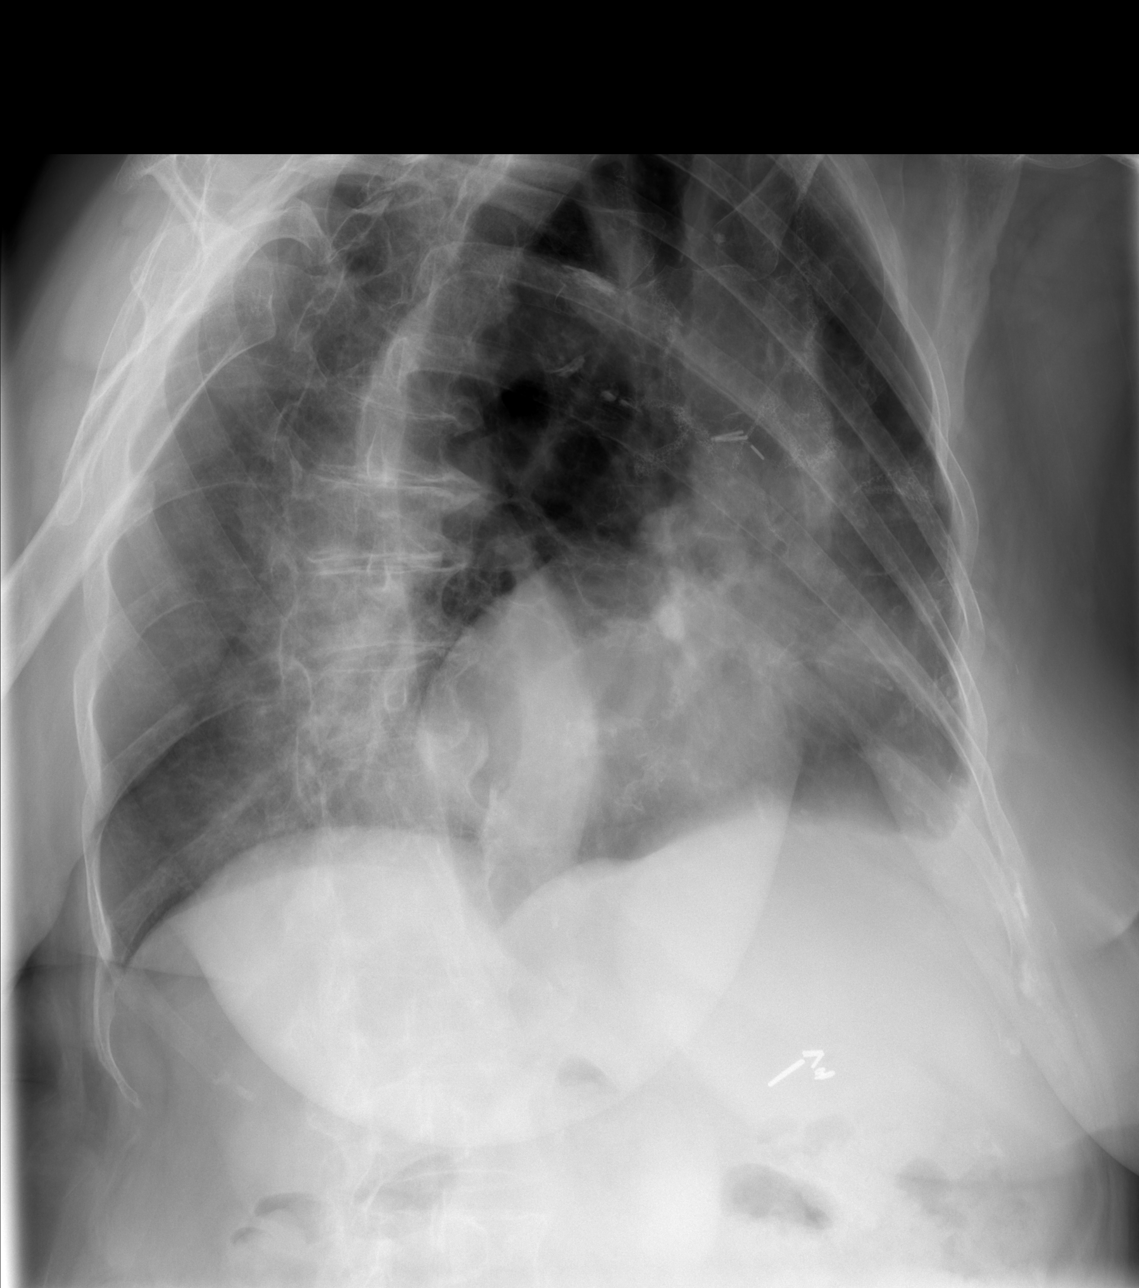

[3 of 3 positions shown; findings below may reference images not displayed]

FINDINGS: The cardiac silhouette remains mildly enlarged and the aorta remains
tortuous and partially calcified. The lungs remain hyperexpanded
with mildly prominent interstitial markings. Old, healed 7th rib
fracture with bridging callus. Right perihilar and right lung
surgical clips and staples. No acute fracture or pneumothorax seen.
Cholecystectomy clips. Thoracic spine degenerative changes.
IMPRESSION: 1. No acute abnormality.
2. Stable mild cardiomegaly and changes of COPD.

## 2018-10-10 IMAGING — CR FACIAL BONES - 1-2 VIEW
2 series · 2 of 2 positions shown · non-contrast
Comparison: None.

CLINICAL DATA: Injury to the face after fall x 3 days , pain and
bruising on the right eye

EXAM:
FACIAL BONES - 1-2 VIEW

[w waters]
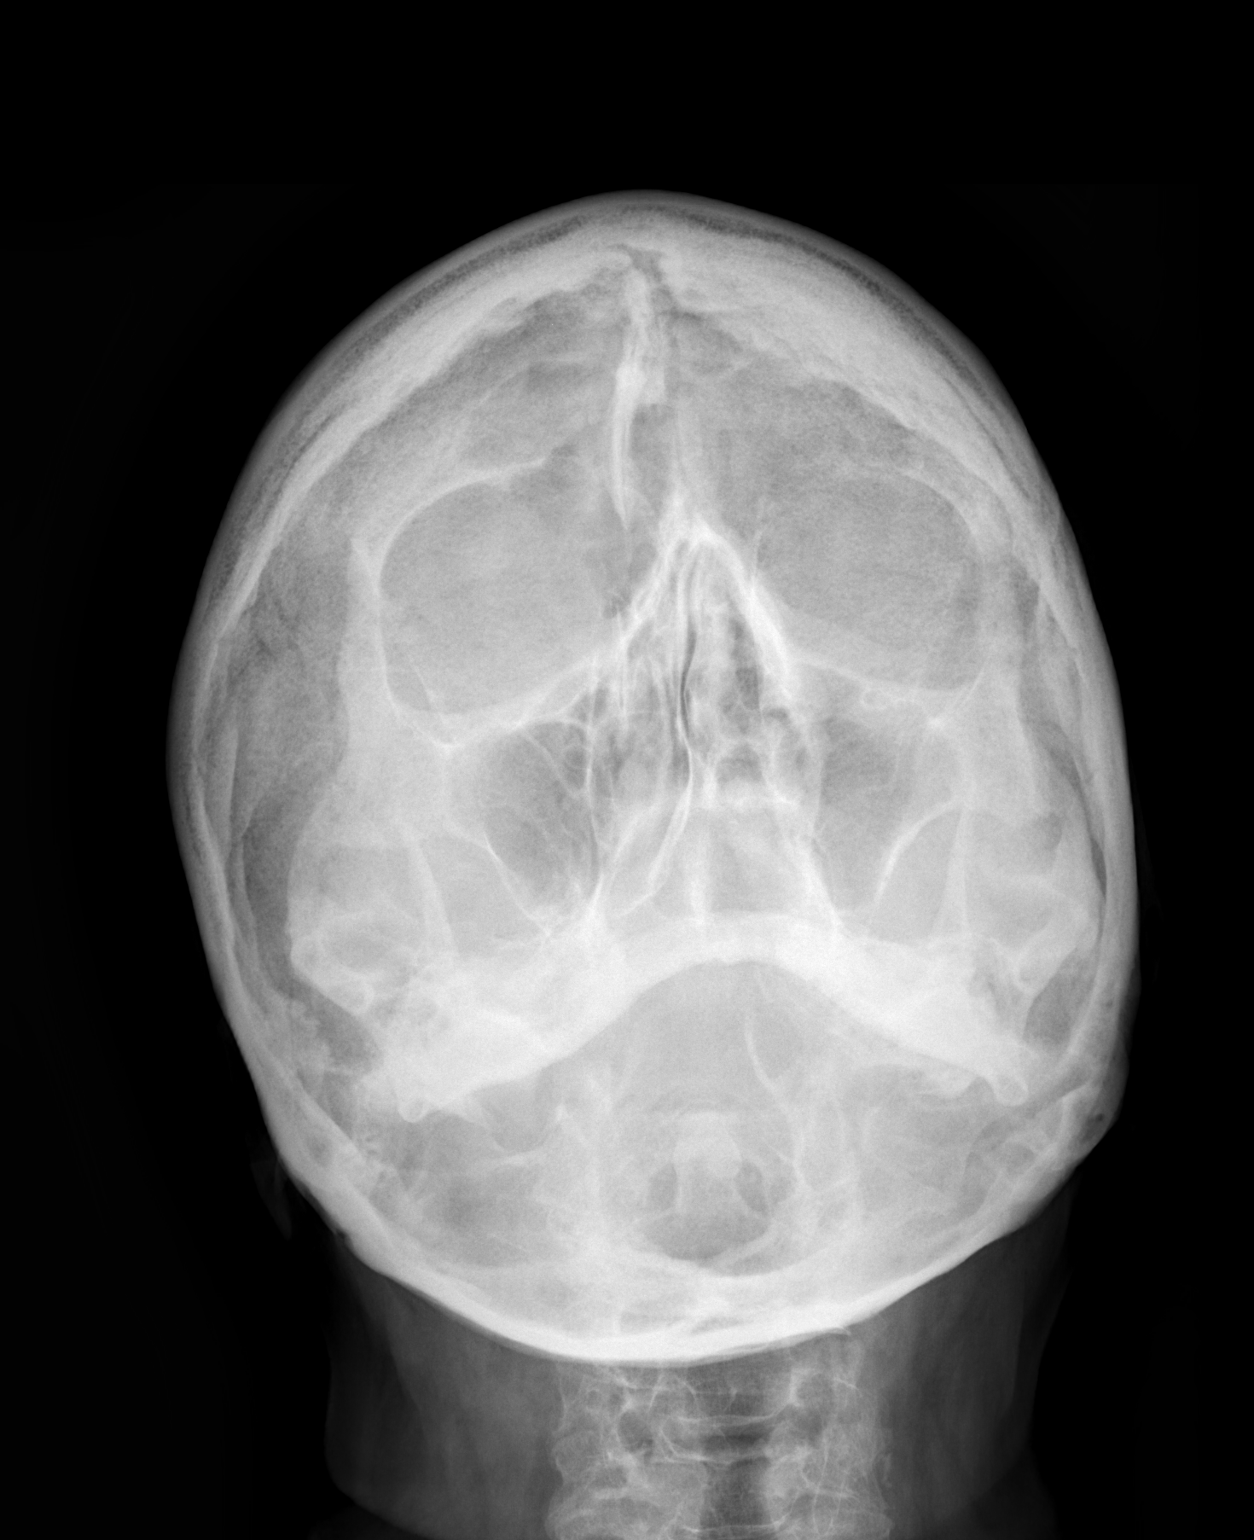

[w skull lat]
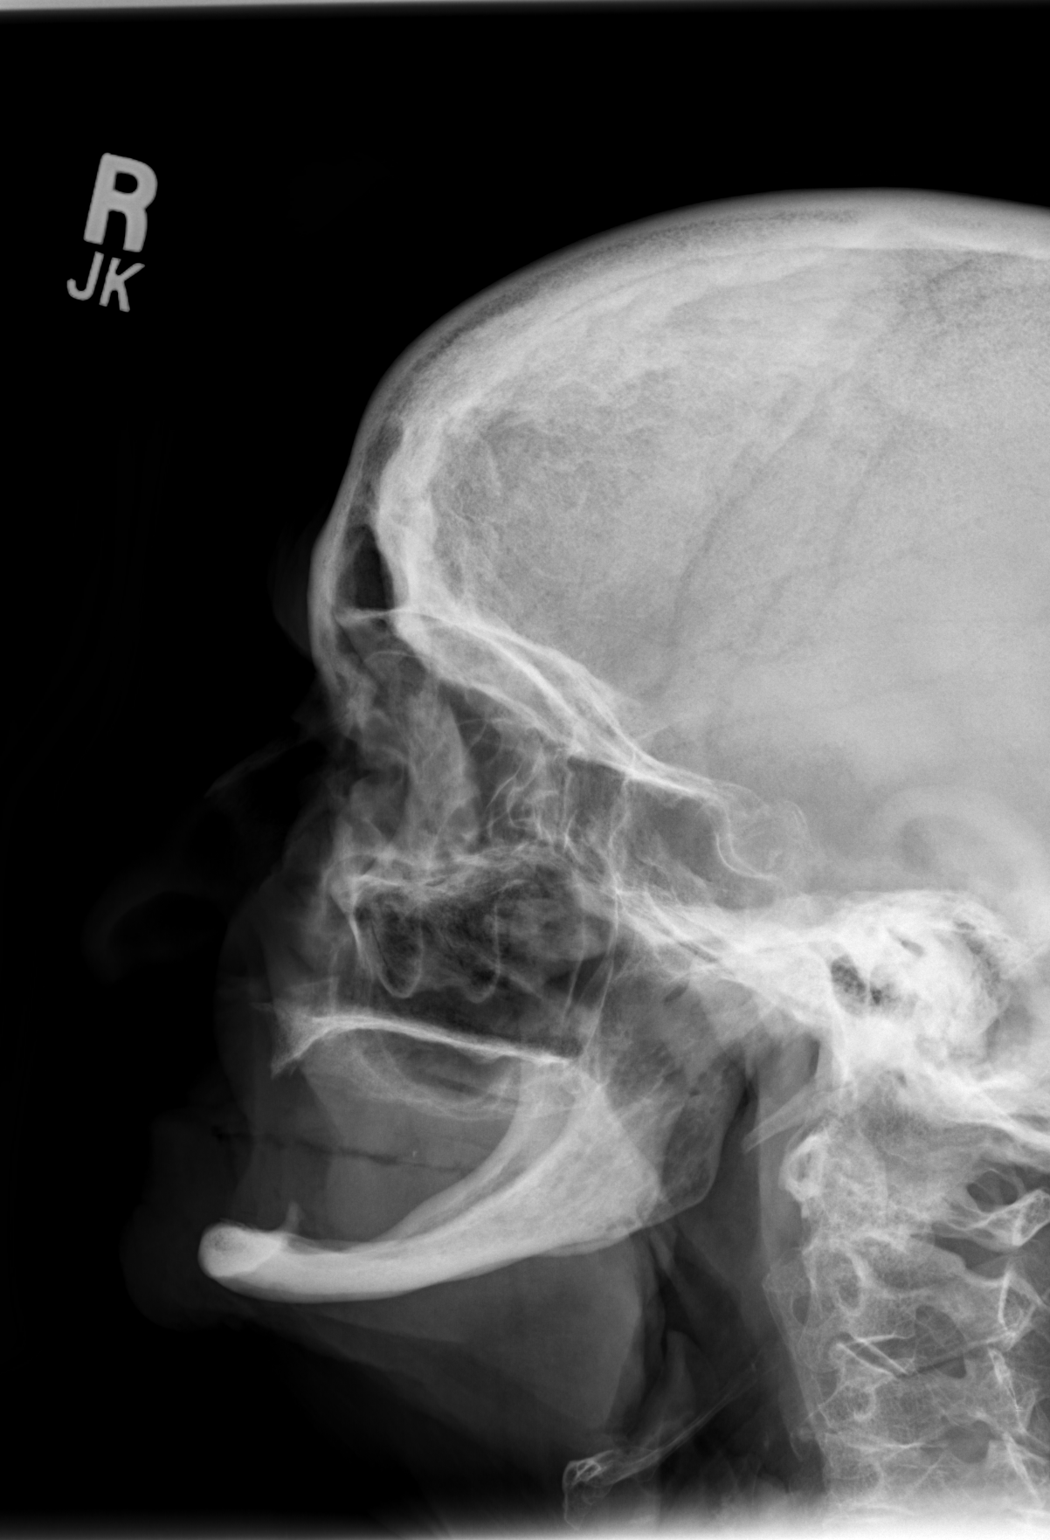

[2 of 2 positions shown; findings below may reference images not displayed]

FINDINGS: No fracture or bone lesion.

Probable mucosal thickening along the left maxillary sinus. Sinuses
otherwise clear with no air-fluid levels.

Soft tissues are unremarkable.
IMPRESSION: No fracture.

## 2018-10-18 ENCOUNTER — Ambulatory Visit (INDEPENDENT_AMBULATORY_CARE_PROVIDER_SITE_OTHER): Payer: Medicare Other | Admitting: Orthopaedic Surgery

## 2018-10-19 ENCOUNTER — Telehealth (INDEPENDENT_AMBULATORY_CARE_PROVIDER_SITE_OTHER): Payer: Self-pay

## 2018-10-19 NOTE — Telephone Encounter (Signed)
I tried to call pt and lvm to return called to be prescreen, but no answer, thank you. Other number listed was busy signal.

## 2018-10-22 ENCOUNTER — Ambulatory Visit (INDEPENDENT_AMBULATORY_CARE_PROVIDER_SITE_OTHER): Payer: Medicare Other | Admitting: Orthopedic Surgery

## 2019-02-04 ENCOUNTER — Inpatient Hospital Stay: Payer: Medicare Other | Attending: Internal Medicine

## 2019-02-04 ENCOUNTER — Ambulatory Visit (HOSPITAL_COMMUNITY): Admission: RE | Admit: 2019-02-04 | Payer: Medicare Other | Source: Ambulatory Visit

## 2019-02-05 ENCOUNTER — Telehealth: Payer: Self-pay | Admitting: *Deleted

## 2019-02-05 NOTE — Telephone Encounter (Signed)
TCT patient as we have received a notice from [facility] that pt cancelled her scans yesterday. Spoke with pt and she stated she just had too much going on.  She would like those appts re-scheduled.  Appt with Dr. Julien Nordmann on 02/06/19 will need to be rescheduled as well

## 2019-02-06 ENCOUNTER — Ambulatory Visit: Payer: Medicare Other | Admitting: Internal Medicine

## 2019-02-06 ENCOUNTER — Telehealth: Payer: Self-pay | Admitting: Internal Medicine

## 2019-02-06 ENCOUNTER — Telehealth: Payer: Self-pay | Admitting: *Deleted

## 2019-02-06 NOTE — Telephone Encounter (Signed)
Pt missed CT scan 7/13, R/s ct scan for 7/22 11am. Message to scheduling for new lab/provider visit.  Notified pt and son of CT scan to expect a call from scheduling regarding labs and provider visit

## 2019-02-06 NOTE — Telephone Encounter (Signed)
Tried to call pt no answer and no vmail. Per 7/14 sch message

## 2019-02-07 ENCOUNTER — Telehealth: Payer: Self-pay | Admitting: Internal Medicine

## 2019-02-07 NOTE — Telephone Encounter (Signed)
Scheduled appt per 7/15 sch message - pt is aware of appt date and time

## 2019-02-13 ENCOUNTER — Ambulatory Visit (HOSPITAL_COMMUNITY): Admission: RE | Admit: 2019-02-13 | Payer: Medicare Other | Source: Ambulatory Visit

## 2019-02-13 ENCOUNTER — Inpatient Hospital Stay: Payer: Medicare Other

## 2019-02-18 ENCOUNTER — Other Ambulatory Visit: Payer: Self-pay | Admitting: Internal Medicine

## 2019-02-18 ENCOUNTER — Ambulatory Visit
Admission: RE | Admit: 2019-02-18 | Discharge: 2019-02-18 | Disposition: A | Discharge status: Home / Self Care | Payer: Medicare Other | Source: Ambulatory Visit | Attending: Internal Medicine | Admitting: Internal Medicine

## 2019-02-18 DIAGNOSIS — R52 Pain, unspecified: Secondary | ICD-10-CM

## 2019-02-18 IMAGING — US RIGHT LOWER EXTREMITY VENOUS ULTRASOUND
1 series · 13 of 24 positions shown · non-contrast
Comparison: None.

CLINICAL DATA: Right leg pain and swelling



[Series 1: right lower extremity venous ultrasound · 0.07mm/px · 13 of 31 slices shown]
[im 1/31]
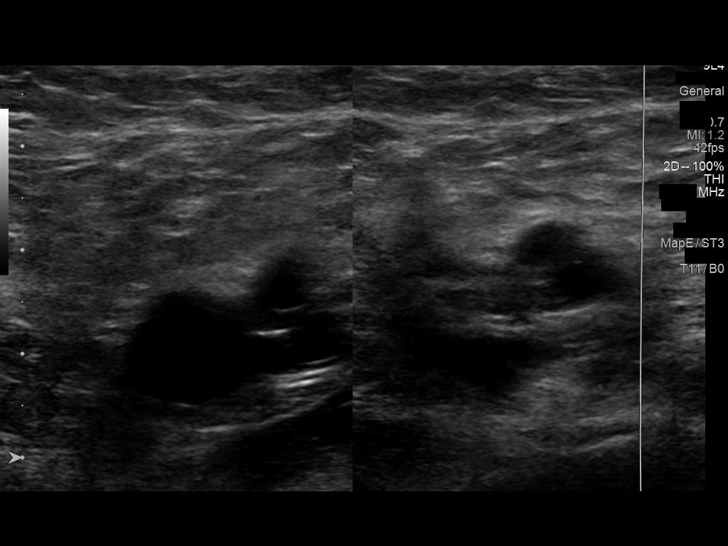
[im 3/31]
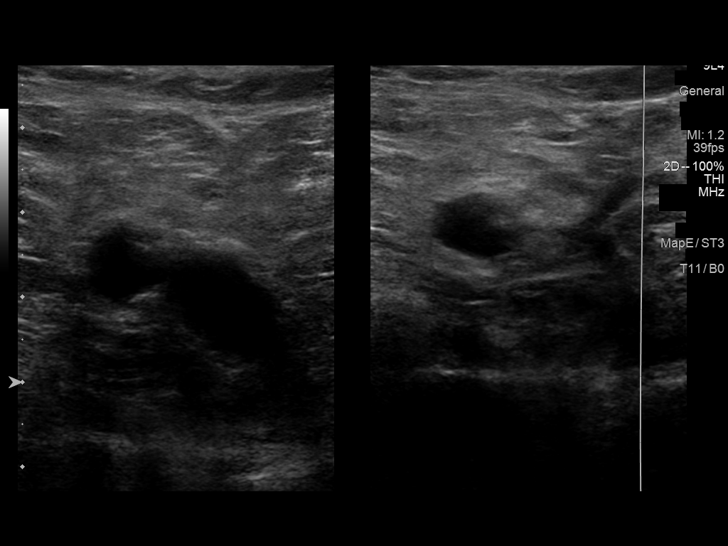
[im 6/31]
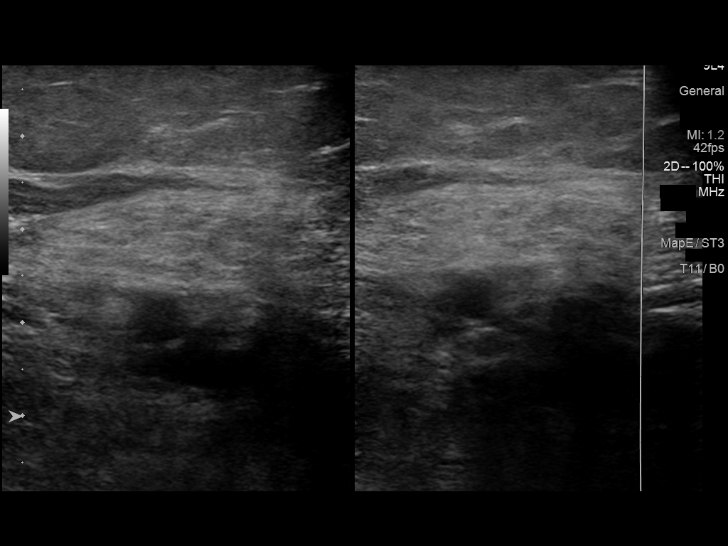
[im 8/31]
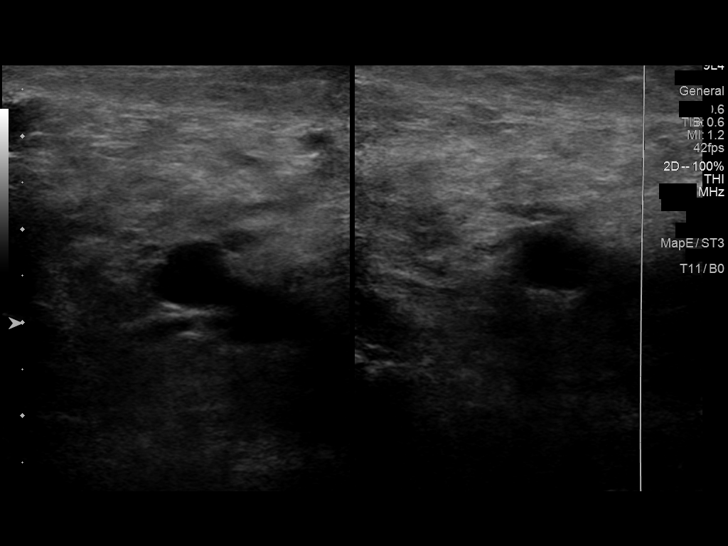
[im 11/31]
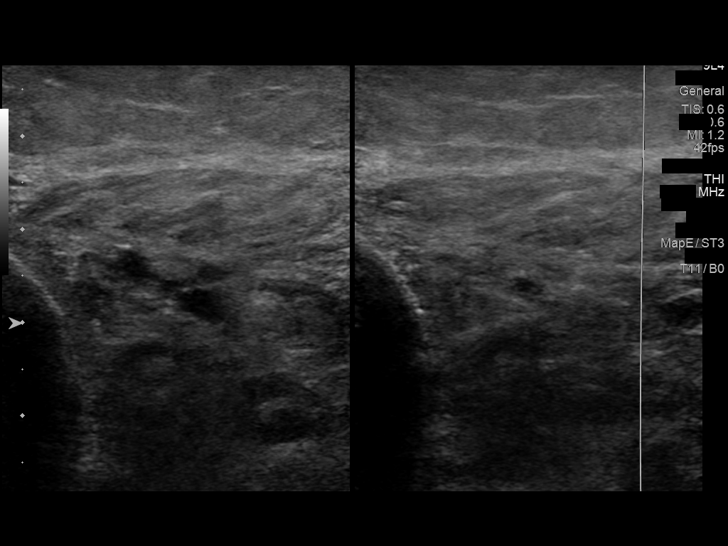
[im 14/31]
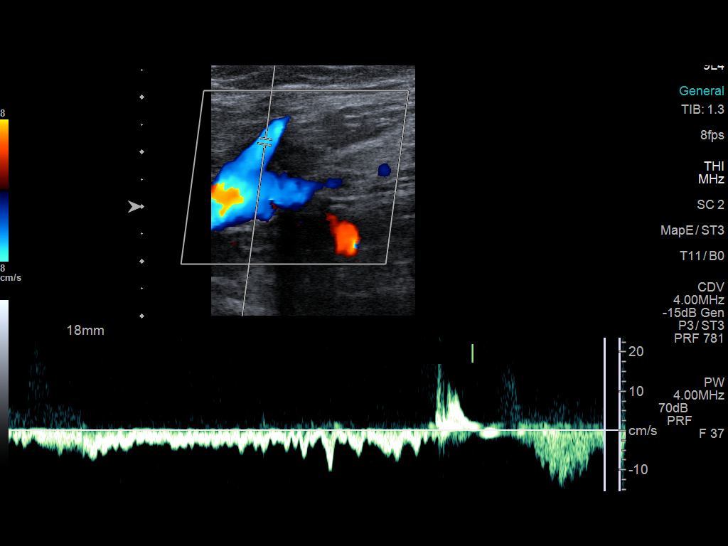
[im 16/31]
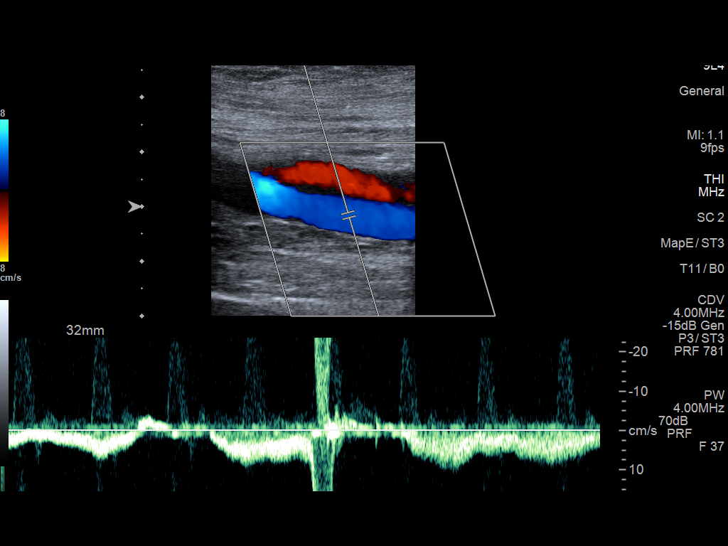
[im 17/31]
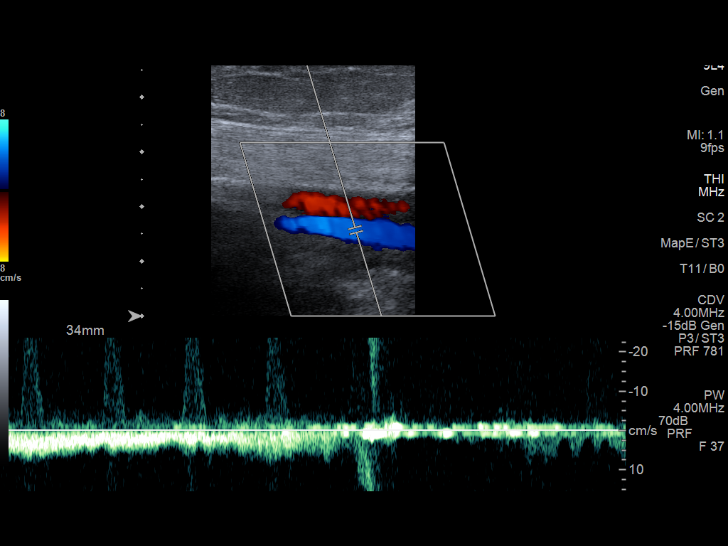
[im 20/31]
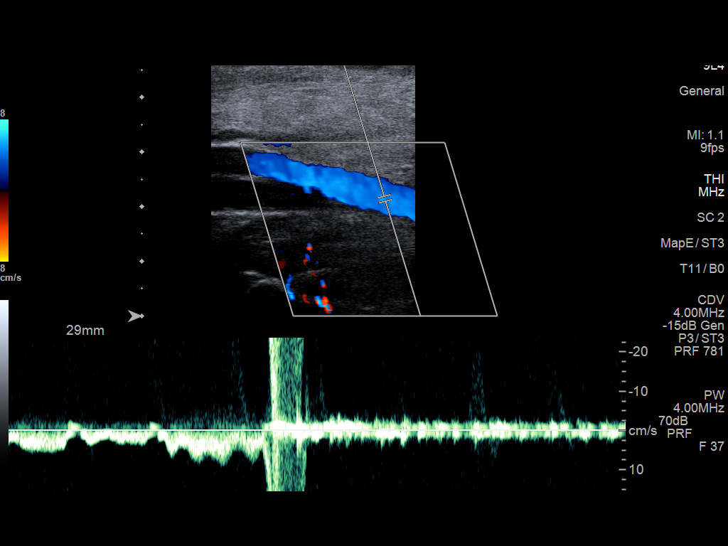
[im 23/31]
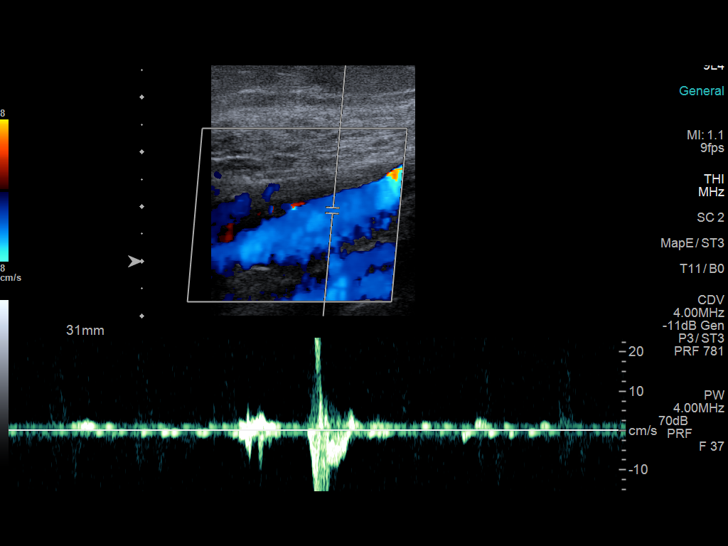
[im 25/31]
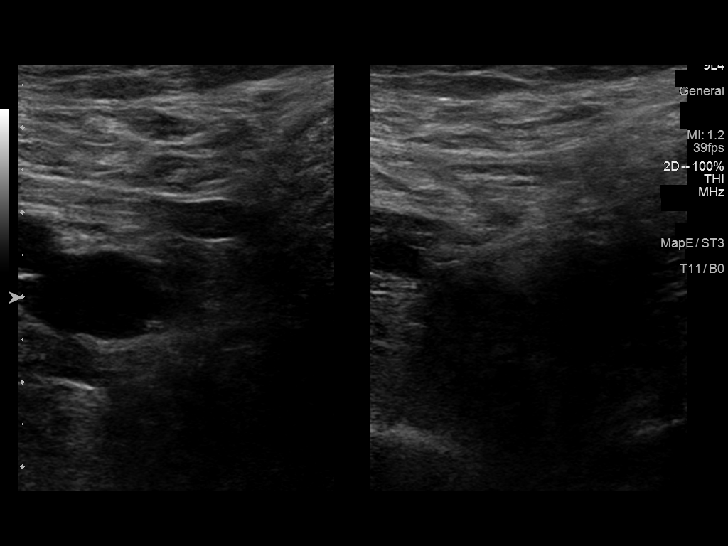
[im 28/31]
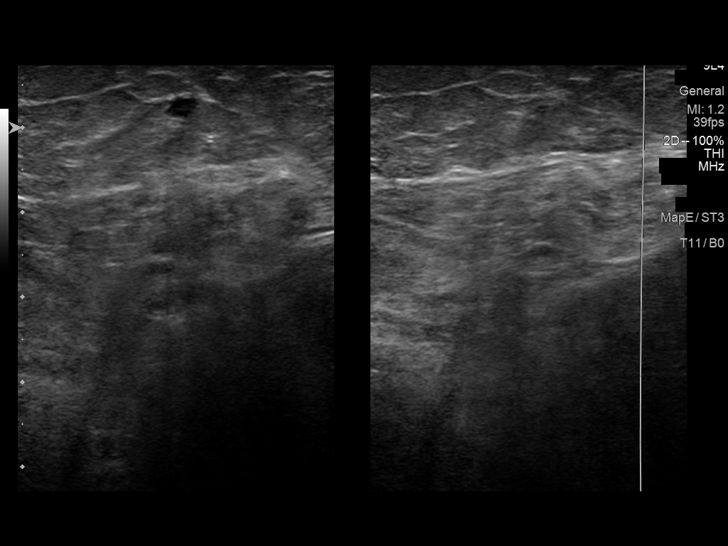
[im 31/31]
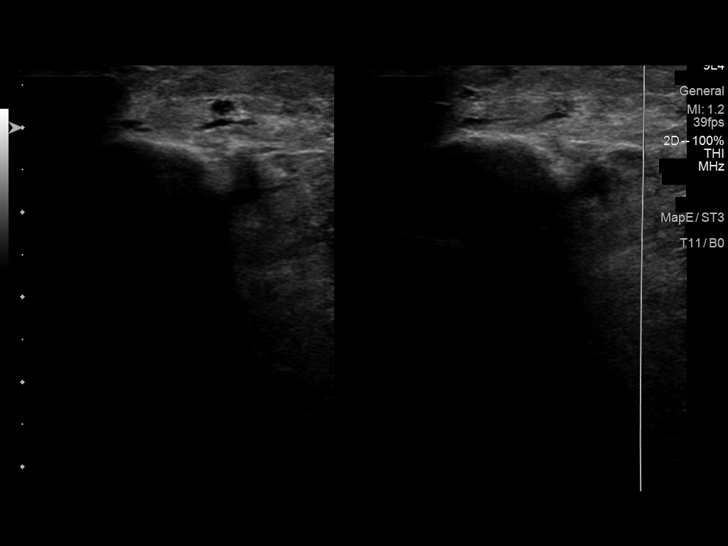

[13 of 24 positions shown; findings below may reference images not displayed]

FINDINGS: Contralateral Common Femoral Vein: Respiratory phasicity is normal
and symmetric with the symptomatic side. No evidence of thrombus.
Normal compressibility.

Common Femoral Vein: No evidence of thrombus. Normal
compressibility, respiratory phasicity and response to augmentation.

Saphenofemoral Junction: No evidence of thrombus. Normal
compressibility and flow on color Doppler imaging.

Profunda Femoral Vein: No evidence of thrombus. Normal
compressibility and flow on color Doppler imaging.

Femoral Vein: No evidence of thrombus. Normal compressibility,
respiratory phasicity and response to augmentation.

Popliteal Vein: No evidence of thrombus. Normal compressibility,
respiratory phasicity and response to augmentation.

Calf Veins: No evidence of thrombus. Normal compressibility and flow
on color Doppler imaging.

Superficial Great Saphenous Vein: No evidence of thrombus. Normal
compressibility.

Venous Reflux:  Not assessed

Other Findings:  None.
IMPRESSION: No evidence of deep venous thrombosis.

## 2019-02-19 ENCOUNTER — Inpatient Hospital Stay: Payer: Medicare Other | Admitting: Internal Medicine

## 2019-04-24 ENCOUNTER — Ambulatory Visit (INDEPENDENT_AMBULATORY_CARE_PROVIDER_SITE_OTHER): Payer: Medicare Other | Admitting: General Practice

## 2019-04-24 ENCOUNTER — Other Ambulatory Visit: Payer: Self-pay

## 2019-04-24 ENCOUNTER — Encounter: Payer: Self-pay | Admitting: Adult Health

## 2019-04-24 VITALS — BP 116/70 | HR 58 | Ht 62.0 in | Wt 164.0 lb

## 2019-04-24 DIAGNOSIS — E7800 Pure hypercholesterolemia, unspecified: Secondary | ICD-10-CM

## 2019-04-24 DIAGNOSIS — I10 Essential (primary) hypertension: Secondary | ICD-10-CM | POA: Diagnosis not present

## 2019-04-24 DIAGNOSIS — Z01818 Encounter for other preprocedural examination: Secondary | ICD-10-CM | POA: Diagnosis not present

## 2019-04-24 NOTE — Progress Notes (Signed)
Cardiology Clinic Note   Patient Name: [name] Date of Encounter: 04/24/2019  Primary Care Provider:  Wenda Low, MD Primary Cardiologist:  Kirk Ruths, MD  Patient Profile    [name] 78 year old female presents today for cardiac evaluation for operative clearance.   Past Medical History    Past Medical History:  Diagnosis Date  . Adenocarcinoma of right lung, stage 1 ([place]) 07/28/2016  . Anxiety   . Cancer ([place])    uterine  . COPD (chronic obstructive pulmonary disease) ([place])   . Cyst    left side of neck  . Depression   . GERD (gastroesophageal reflux disease)   . Heart murmur    per patient  . Hyperlipidemia   . Hypertension   . Low back pain   . Lumbar back pain   . Osteoarthritis   . Osteoporosis    Past Surgical History:  Procedure Laterality Date  . ANKLE SURGERY Right    horse accident  . APPENDECTOMY    . BACK SURGERY    . CHOLECYSTECTOMY    . EYE SURGERY     lense implant  . FRACTURE SURGERY    . HIP SURGERY  01/2010   Left Hip   . INNER EAR SURGERY Bilateral 1970   related to severe ear infections  . KNEE SURGERY     Right knee  . LEFT HEART CATH AND CORONARY ANGIOGRAPHY N/A 12/11/2017   Procedure: LEFT HEART CATH AND CORONARY ANGIOGRAPHY;  Surgeon: Jettie Booze, MD;  Location: [facility];  Service: Cardiovascular;  Laterality: N/A;  . LOBECTOMY Right 05/09/2016   Procedure: RIGHT UPPER LOBECTOMY;  Surgeon: [name], MD;  Location: [facility];  Service: Thoracic;  Laterality: Right;  . MASS EXCISION  08/25/2011   Procedure: EXCISION MASS;  Surgeon: Harl Bowie, MD;  Location: [facility];  Service: General;  Laterality: N/A;  excision left neck mass  . TOTAL ABDOMINAL HYSTERECTOMY    . VIDEO ASSISTED THORACOSCOPY (VATS)/WEDGE RESECTION Right 05/09/2016   Procedure: VIDEO ASSISTED THORACOSCOPY (VATS)/WEDGE RESECTION;  Surgeon: [name], MD;  Location: [facility];  Service:  Thoracic;  Laterality: Right;    Allergies  Allergies  Allergen Reactions  . Boniva [Ibandronate Sodium] Other (See Comments)    UNSPECIFIED REACTION   . Lyrica [Pregabalin] Nausea And Vomiting    Dizziness and blurred vision as well    History of Present Illness    [name] 78 year old female was last seen by Jory Sims, DNP on 01/17/2018.  During that time she was noticing generalized fatigue sharp pain under her right breast and back from her prior thoracotomy.  She denied dizziness, palpitations, and dyspnea and was compliant with her medication.  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  Her echocardiogram on 01/23/2018 showed an LVEF of 50 to 55% left ventricle moderate hypokinesis mid anterior septal myocardium and grade 1 diastolic dysfunction.  Her PMH includes yes essential hypertension, DVT, acute systolic heart failure, acute exacerbation of COPD GERD, thoracic radiculitis, DDD femoral neck fracture, and infected sebaceous cyst.  She presents to the clinic today and states she has been doing well.  She states that she would like clearance for her knee surgery.  She feels she is unable to walk any distance due to knee pain and states that she can no longer walk out to her mailbox due to discomfort in her knees.  She denies chest pain, shortness  of breath, lower extremity edema, fatigue, palpitations, melena, hematuria, hemoptysis, diaphoresis, weakness, presyncope, syncope, orthopnea, and PND.   Home Medications    Prior to Admission medications   Medication Sig Start Date End Date Taking? Authorizing Provider  albuterol (PROVENTIL HFA;VENTOLIN HFA) 108 (90 BASE) MCG/ACT inhaler Inhale 2 puffs into the lungs every 6 (six) hours as needed. Patient taking differently: Inhale 2 puffs into the lungs every 4 (four) hours as needed for wheezing or shortness of breath.  04/28/15   Hosie Poisson, MD  albuterol (PROVENTIL)  (2.5 MG/3ML) 0.083% nebulizer solution Take 3 mLs (2.5 mg total) by nebulization every 2 (two) hours as needed for wheezing or shortness of breath. 04/28/15   Hosie Poisson, MD  aluminum-magnesium hydroxide-simethicone (MAALOX) 277-824-23 MG/5ML SUSP Take 30 mLs by mouth as needed (indigestion).    [provider]  amLODipine (NORVASC) 5 MG tablet Take 5 mg by mouth daily.    [provider]  atorvastatin (LIPITOR) 20 MG tablet Take 20 mg by mouth daily.     [provider]  diazepam (VALIUM) 5 MG tablet Take 2.5 mg by mouth every 8 (eight) hours as needed for anxiety (for nerves). Must last 30 days. Filled 11-28-17    [provider]  Fluticasone-Salmeterol (ADVAIR DISKUS) 250-50 MCG/DOSE AEPB Inhale 1 puff into the lungs every 12 (twelve) hours. 08/30/15   Debbe Odea, MD  gabapentin (NEURONTIN) 400 MG capsule Take 1 capsule (400 mg total) by mouth 4 (four) times daily. 05/24/18 08/22/18  Vevelyn Francois, NP  Glucosamine HCl (GLUCOSAMINE PO) Take 1 tablet by mouth daily.    [provider]  lisinopril (PRINIVIL,ZESTRIL) 10 MG tablet Take 1 tablet (10 mg total) by mouth every morning. 12/12/17   Domenic Polite, MD  Magnesium 250 MG TABS Take 250 mg by mouth daily.    [provider]  meloxicam (MOBIC) 7.5 MG tablet Take 7.5 mg by mouth daily.    [provider]  Menthol, Topical Analgesic, (BERRI-FREEZ PAIN RELIEVING) 10 % LIQD Apply 1 application topically as needed (pain).    [provider]  metoCLOPramide (REGLAN) 5 MG tablet Take 5 mg by mouth 2 (two) times daily.     [provider]  metoprolol tartrate (LOPRESSOR) 25 MG tablet Take 0.5 tablets (12.5 mg total) by mouth 2 (two) times daily. 12/12/17   Domenic Polite, MD  Multiple Vitamins-Minerals (WOMENS 50+ ADVANCED PO) Take 1 tablet by mouth daily.    [provider]  omega-3 acid ethyl esters (LOVAZA) 1 g capsule Take by mouth 2 (two) times daily.     [provider]  pantoprazole (PROTONIX) 40 MG tablet Take 1 tablet (40 mg total) by mouth daily at 6 (six) AM. 04/28/15   Hosie Poisson, MD  raloxifene (EVISTA) 60 MG tablet Take 60 mg by mouth daily.    [provider]  sertraline (ZOLOFT) 100 MG tablet Take 100 mg by mouth daily.    [provider]  tiotropium (SPIRIVA) 18 MCG inhalation capsule Place 18 mcg into inhaler and inhale daily.      [provider]  traMADol (ULTRAM) 50 MG tablet Take 50 mg by mouth 3 (three) times daily as needed. 12/04/17   [provider]    Family History    Family History  Problem Relation Age of Onset  . Heart disease Mother   . Heart disease Father   . Cancer Sister        #1, breast  . Heart  disease Brother        had CABG   She indicated that her mother is deceased. She indicated that her father is deceased. She indicated that the status of her sister is unknown. She indicated that the status of her brother is unknown.  Social History    Social History   Socioeconomic History  . Marital status: Widowed    Spouse name: Not on file  . Number of children: Not on file  . Years of education: Not on file  . Highest education level: Not on file  Occupational History  . Not on file  Social Needs  . Financial resource strain: Not on file  . Food insecurity    Worry: Not on file    Inability: Not on file  . Transportation needs    Medical: Not on file    Non-medical: Not on file  Tobacco Use  . Smoking status: Former Smoker    Packs/day: 2.00    Years: 51.00    Pack years: 102.00    Types: Cigarettes    Quit date: 05/04/2012    Years since quitting: 6.9  . Smokeless tobacco: Never Used  Substance and Sexual Activity  . Alcohol use: No    Alcohol/week: 0.0 standard drinks  . Drug use: No  . Sexual activity: Yes    Birth control/protection: Other-see comments  Lifestyle  . Physical activity    Days per week: Not on file    Minutes per  session: Not on file  . Stress: Not on file  Relationships  . Social Herbalist on phone: Not on file    Gets together: Not on file    Attends religious service: Not on file    Active member of club or organization: Not on file    Attends meetings of clubs or organizations: Not on file    Relationship status: Not on file  . Intimate partner violence    Fear of current or ex partner: Not on file    Emotionally abused: Not on file    Physically abused: Not on file    Forced sexual activity: Not on file  Other Topics Concern  . Not on file  Social History Narrative   Single, lives alone with 2 dogs   3 children   Retired: several careers included Regulatory affairs officer, diners, truck stops   Works now on the weekends at [employer]:  No chills, fever, night sweats or weight changes.  Cardiovascular:  No chest pain, dyspnea on exertion, edema, orthopnea, palpitations, paroxysmal nocturnal dyspnea. Dermatological: No rash, lesions/masses Respiratory: No cough, dyspnea Urologic: No hematuria, dysuria Abdominal:   No nausea, vomiting, diarrhea, bright red blood per rectum, melena, or hematemesis Neurologic:  No visual changes, wkns, changes in mental status. All other systems reviewed and are otherwise negative except as noted above.  Physical Exam    VS:  BP 116/70   Pulse (!) 58   Ht 5\' 2"  (1.575 m)   Wt 164 lb (74.4 kg)   BMI 30.00 kg/m  , BMI Body mass index is 30 kg/m. GEN: Well nourished, well developed, in no acute distress. HEENT: normal. Neck: Supple, no JVD, carotid bruits, or masses. Cardiac: RRR, no murmurs, rubs, or gallops. No clubbing, cyanosis, edema.  Radials/DP/PT 2+ and equal bilaterally.  Respiratory:  Respirations regular and unlabored, clear to auscultation bilaterally. GI: Soft, nontender, nondistended, BS + x 4. MS: no  deformity or atrophy. Skin: warm and dry, no rash. Neuro:  Strength and sensation are intact.  Psych: Normal affect.  Accessory Clinical Findings    ECG personally reviewed by me today-sinus bradycardia 58 bpm- No acute changes  EKG 03/31/2018 Sinus rhythm 98 bpm  Echocardiogram 01/23/2018 Study Conclusions  - Left ventricle: The cavity size was normal. Wall thickness was   normal. Systolic function was normal. The estimated ejection   fraction was in the range of 50% to 55%. Moderate hypokinesis of   the midanteroseptal myocardium. Doppler parameters are consistent   with abnormal left ventricular relaxation (grade 1 diastolic   dysfunction).  Cardiac catheterization 12/11/2017  Prox RCA lesion is 25% stenosed.  Mid LAD lesion is 25% stenosed.  There is moderate left ventricular systolic dysfunction.  LV end diastolic pressure is moderately elevated.  The left ventricular ejection fraction is 25-35% by visual estimate, with apical hypokinesis.  There is no aortic valve stenosis.   Nonobstructive CAD.  Takotsubo cardiomyopathy pattern.  Continue medical therapy.   Assessment & Plan   Essential hypertension-BP today 116/70.  Well-controlled at home Continue amlodipine 5 mg tablet daily Continue lisinopril 10 mg tablet daily Continue metoprolol tartrate 12.5 mg tablet twice daily Increase physical activity as tolerated Heart healthy low-sodium high-fiber diet  Hyperlipidemia- nonobstructive coronary disease on 12/11/2017 cardiac catheterization. Continue atorvastatin 20 mg tablet daily Increase physical activity as tolerated Heart healthy low-sodium high-fiber diet  Preop cardiac evaluation for-  Bilateral total knee artroplasty. Patient statesshe is not able to walk up 2 flights of stairs or walk 4 city blocks to complete 4 METS of physical activity, due to her knee pain.  She underwent cardiac ischemic evaluation on 12/11/2017 which showed nonobstructive coronary disease.  Her echocardiogram on 01/23/2018 showed an ejection fraction of 50 to 55% and grade 1 diastolic  dysfunction.  Therefore,no further cardiac evaluation is recommended at this time.HisRCRI riskis class I,with a 0.4% risk of a major cardiac event.    Primary Cardiologist:Dr. Kirk Ruths  Chart reviewed as part of pre-operative protocol coverage. Given past medical history and time since last visit, based on ACC/AHA guidelines, [facility] be at acceptable risk for the planned procedure without further cardiovascular testing.   I will route this recommendation to the requesting party via Epic fax function and remove from pre-op pool.- Planning bilateral knee arthroplasty  Deberah Pelton, NP-C 04/24/2019, 4:49 PM

## 2019-04-24 NOTE — Patient Instructions (Signed)
Follow-up with Dr Stanford Breed as needed.   You have been cleared for surgery!

## 2019-05-06 ENCOUNTER — Other Ambulatory Visit: Payer: Self-pay | Admitting: Orthopedic Surgery

## 2019-05-20 NOTE — Patient Instructions (Addendum)
DUE TO COVID-19 ONLY ONE VISITOR IS ALLOWED TO COME WITH YOU AND STAY IN THE WAITING ROOM ONLY DURING PRE OP AND PROCEDURE DAY OF SURGERY. THE 1 VISITOR MAY VISIT WITH YOU AFTER SURGERY IN YOUR PRIVATE ROOM DURING VISITING HOURS ONLY!  YOU NEED TO HAVE A COVID 19 TEST ON_10/29______ @__10 :30_____, THIS TEST MUST BE DONE BEFORE SURGERY, COME  [address], [place] , [address].  ([place]) ONCE YOUR COVID TEST IS COMPLETED, PLEASE BEGIN THE QUARANTINE INSTRUCTIONS AS OUTLINED IN YOUR HANDOUT.                [name]    Your procedure is scheduled on: 05/27/19   Report to [facility] Main  Entrance   Report to admitting at 7:20 AM     Call this number if you have problems the morning of surgery [place], NO CHEWING GUM [place].    Do not eat food After Midnight.   YOU MAY HAVE CLEAR LIQUIDS FROM MIDNIGHT UNTIL 7:00 AM  . At 7:00 AM Please finish the prescribed Pre-Surgery  drink.   Nothing by mouth after you finish the  drink !   Take these medicines the morning of surgery with A SIP OF WATER:  Gabapentin, Zoloft, Metoprolol, Amlodipine, Raloxifene,Protonix Use your inhalers and bring them to the hospital with you                                 You may not have any metal on your body including hair pins and              piercings              Do not wear jewelry, make-up, lotions, powders or perfumes, deodorant             Do not wear nail polish on your fingernails.  Do not shave  48 hours prior to surgery.           Do not bring valuables to the hospital. [place].  Contacts, dentures or bridgework may not be worn into surgery.       Special Instructions: N/A              Please read over the following fact sheets you were given: _____________________________________________________________________              [facility] - Preparing for Surgery  Before surgery, you can play an important role.   Because skin is not sterile, your skin needs to be as free of germs as possible.   You can reduce the number of germs on your skin by washing with CHG (chlorahexidine gluconate) soap before surgery.   CHG is an antiseptic cleaner which kills germs and bonds with the skin to continue killing germs even after washing. Please DO NOT use if you have an allergy to CHG or antibacterial soaps .  If your skin becomes reddened/irritated stop using the CHG and inform your nurse when you arrive at Short Stay. Do not shave (including legs and underarms) for at least 48 hours prior to the first CHG shower. Please follow these instructions carefully:   1.  Shower with CHG Soap the night  before surgery and the  morning of Surgery.  2.  If you choose to wash your hair, wash your hair first as usual with your  normal  shampoo.  3.  After you shampoo, rinse your hair and body thoroughly to remove the  shampoo.                                        4.  Use CHG as you would any other liquid soap.  You can apply chg directly  to the skin and wash                       Gently with a scrungie or clean washcloth.  5.  Apply the CHG Soap to your body ONLY FROM THE NECK DOWN.   Do not use on face/ open                           Wound or open sores. Avoid contact with eyes, ears mouth and genitals (private parts).                       Wash face,  Genitals (private parts) with your normal soap.             6.  Wash thoroughly, paying special attention to the area where your surgery  will be performed.  7.  Thoroughly rinse your body with warm water from the neck down.  8.  DO NOT shower/wash with your normal soap after using and rinsing off  the CHG Soap.             9.  Pat yourself dry with a clean towel.            10.  Wear clean pajamas.            11.  Place clean sheets on your bed the night of your first shower and  do not  sleep with pets. Day of Surgery : Do not apply any lotions/deodorants the morning of surgery.  Please wear clean clothes to the hospital/surgery center.  FAILURE TO FOLLOW THESE INSTRUCTIONS MAY RESULT IN THE CANCELLATION OF YOUR SURGERY PATIENT SIGNATURE_________________________________  NURSE SIGNATURE__________________________________  ________________________________________________________________________   Adam Phenix  An incentive spirometer is a tool that can help keep your lungs clear and active. This tool measures how well you are filling your lungs with each breath. Taking long deep breaths may help reverse or decrease the chance of developing breathing (pulmonary) problems (especially infection) following:  A long period of time when you are unable to move or be active. BEFORE THE PROCEDURE   If the spirometer includes an indicator to show your best effort, your nurse or respiratory therapist will set it to a desired goal.  If possible, sit up straight or lean slightly forward. Try not to slouch.  Hold the incentive spirometer in an upright position. INSTRUCTIONS FOR USE  1. Sit on the edge of your bed if possible, or sit up as far as you can in bed or on a chair. 2. Hold the incentive spirometer in an upright position. 3. Breathe out normally. 4. Place the mouthpiece in your mouth and seal your lips tightly around it. 5. Breathe in slowly and as deeply as possible, raising the piston or the ball toward the top of  the column. 6. Hold your breath for 3-5 seconds or for as long as possible. Allow the piston or ball to fall to the bottom of the column. 7. Remove the mouthpiece from your mouth and breathe out normally. 8. Rest for a few seconds and repeat Steps 1 through 7 at least 10 times every 1-2 hours when you are awake. Take your time and take a few normal breaths between deep breaths. 9. The spirometer may include an indicator to show your best effort.  Use the indicator as a goal to work toward during each repetition. 10. After each set of 10 deep breaths, practice coughing to be sure your lungs are clear. If you have an incision (the cut made at the time of surgery), support your incision when coughing by placing a pillow or rolled up towels firmly against it. Once you are able to get out of bed, walk around indoors and cough well. You may stop using the incentive spirometer when instructed by your caregiver.  RISKS AND COMPLICATIONS  Take your time so you do not get dizzy or light-headed.  If you are in pain, you may need to take or ask for pain medication before doing incentive spirometry. It is harder to take a deep breath if you are having pain. AFTER USE  Rest and breathe slowly and easily.  It can be helpful to keep track of a log of your progress. Your caregiver can provide you with a simple table to help with this. If you are using the spirometer at home, follow these instructions: [place] IF:   You are having difficultly using the spirometer.  You have trouble using the spirometer as often as instructed.  Your pain medication is not giving enough relief while using the spirometer.  You develop fever of 100.5 F (38.1 C) or higher. SEEK IMMEDIATE MEDICAL CARE IF:   You cough up bloody sputum that had not been present before.  You develop fever of 102 F (38.9 C) or greater.  You develop worsening pain at or near the incision site. MAKE SURE YOU:   Understand these instructions.  Will watch your condition.  Will get help right away if you are not doing well or get worse. Document Released: 11/21/2006 Document Revised: 10/03/2011 Document Reviewed: 01/22/2007 [facility] Patient Information 2014 [place], [place].   ________________________________________________________________________

## 2019-05-21 ENCOUNTER — Encounter (HOSPITAL_COMMUNITY)
Admission: RE | Admit: 2019-05-21 | Discharge: 2019-05-21 | Disposition: A | Discharge status: Home / Self Care | Payer: Medicare Other | Source: Ambulatory Visit | Attending: Orthopedic Surgery | Admitting: Orthopedic Surgery

## 2019-05-21 ENCOUNTER — Encounter (HOSPITAL_COMMUNITY): Payer: Self-pay

## 2019-05-21 ENCOUNTER — Other Ambulatory Visit: Payer: Self-pay

## 2019-05-21 DIAGNOSIS — M1711 Unilateral primary osteoarthritis, right knee: Secondary | ICD-10-CM | POA: Diagnosis not present

## 2019-05-21 DIAGNOSIS — I10 Essential (primary) hypertension: Secondary | ICD-10-CM | POA: Diagnosis not present

## 2019-05-21 DIAGNOSIS — K219 Gastro-esophageal reflux disease without esophagitis: Secondary | ICD-10-CM | POA: Diagnosis not present

## 2019-05-21 DIAGNOSIS — I2510 Atherosclerotic heart disease of native coronary artery without angina pectoris: Secondary | ICD-10-CM | POA: Insufficient documentation

## 2019-05-21 DIAGNOSIS — Z79899 Other long term (current) drug therapy: Secondary | ICD-10-CM | POA: Diagnosis not present

## 2019-05-21 DIAGNOSIS — J449 Chronic obstructive pulmonary disease, unspecified: Secondary | ICD-10-CM | POA: Diagnosis not present

## 2019-05-21 DIAGNOSIS — Z7901 Long term (current) use of anticoagulants: Secondary | ICD-10-CM | POA: Insufficient documentation

## 2019-05-21 DIAGNOSIS — Z01812 Encounter for preprocedural laboratory examination: Secondary | ICD-10-CM | POA: Insufficient documentation

## 2019-05-21 DIAGNOSIS — F419 Anxiety disorder, unspecified: Secondary | ICD-10-CM | POA: Insufficient documentation

## 2019-05-21 DIAGNOSIS — Z791 Long term (current) use of non-steroidal anti-inflammatories (NSAID): Secondary | ICD-10-CM | POA: Insufficient documentation

## 2019-05-21 DIAGNOSIS — E785 Hyperlipidemia, unspecified: Secondary | ICD-10-CM | POA: Insufficient documentation

## 2019-05-21 HISTORY — DX: Atherosclerotic heart disease of native coronary artery without angina pectoris: I25.10

## 2019-05-21 HISTORY — DX: Takotsubo syndrome: I51.81

## 2019-05-21 HISTORY — DX: Unspecified asthma, uncomplicated: J45.909

## 2019-05-21 LAB — CBC WITH DIFFERENTIAL/PLATELET
Abs Immature Granulocytes: 0.02 10*3/uL (ref 0.00–0.07)
Basophils Absolute: 0 10*3/uL (ref 0.0–0.1)
Basophils Relative: 1 %
Eosinophils Absolute: 0.2 10*3/uL (ref 0.0–0.5)
Eosinophils Relative: 4 %
HCT: 36.7 % (ref 36.0–46.0)
Hemoglobin: 11.3 g/dL — ABNORMAL LOW (ref 12.0–15.0)
Immature Granulocytes: 0 %
Lymphocytes Relative: 22 %
Lymphs Abs: 1.5 10*3/uL (ref 0.7–4.0)
MCH: 27.8 pg (ref 26.0–34.0)
MCHC: 30.8 g/dL (ref 30.0–36.0)
MCV: 90.2 fL (ref 80.0–100.0)
Monocytes Absolute: 0.5 10*3/uL (ref 0.1–1.0)
Monocytes Relative: 8 %
Neutro Abs: 4.5 10*3/uL (ref 1.7–7.7)
Neutrophils Relative %: 65 %
Platelets: 322 10*3/uL (ref 150–400)
RBC: 4.07 MIL/uL (ref 3.87–5.11)
RDW: 15.2 % (ref 11.5–15.5)
WBC: 6.8 10*3/uL (ref 4.0–10.5)
nRBC: 0 % (ref 0.0–0.2)

## 2019-05-21 LAB — COMPREHENSIVE METABOLIC PANEL
ALT: 9 U/L (ref 0–44)
AST: 13 U/L — ABNORMAL LOW (ref 15–41)
Albumin: 3.3 g/dL — ABNORMAL LOW (ref 3.5–5.0)
Alkaline Phosphatase: 80 U/L (ref 38–126)
Anion gap: 9 (ref 5–15)
BUN: 14 mg/dL (ref 8–23)
CO2: 26 mmol/L (ref 22–32)
Calcium: 8.5 mg/dL — ABNORMAL LOW (ref 8.9–10.3)
Chloride: 105 mmol/L (ref 98–111)
Creatinine, Ser: 1.03 mg/dL — ABNORMAL HIGH (ref 0.44–1.00)
GFR calc Af Amer: >60 mL/min (ref >60)
GFR calc non Af Amer: 53 mL/min — ABNORMAL LOW (ref >60)
Glucose, Bld: 94 mg/dL (ref 70–99)
Potassium: 3.7 mmol/L (ref 3.5–5.1)
Sodium: 140 mmol/L (ref 135–145)
Total Bilirubin: 0.1 mg/dL — ABNORMAL LOW (ref 0.3–1.2)
Total Protein: 7.2 g/dL (ref 6.5–8.1)

## 2019-05-21 LAB — SURGICAL PCR SCREEN
MRSA, PCR: POSITIVE — AB
Staphylococcus aureus: POSITIVE — AB

## 2019-05-21 NOTE — Progress Notes (Signed)
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  Cleared for surgery at last office visit 04/24/19 with Coletta Memos, NP   LABS: Labs reviewed: Acceptable for surgery. (all labs ordered are listed, but only abnormal results are displayed)  Labs Reviewed  CBC WITH DIFFERENTIAL/PLATELET - Abnormal; Notable for the following components:      Result Value   Hemoglobin 11.3 (*)    All other components within normal limits  COMPREHENSIVE METABOLIC PANEL - Abnormal; Notable for the following components:   Creatinine, Ser 1.03 (*)    Calcium 8.5 (*)    Albumin 3.3 (*)    AST 13 (*)    Total Bilirubin 0.1 (*)    GFR calc non Af Amer 53 (*)    All other components within normal limits  SURGICAL PCR SCREEN     IMAGES:  CT chest 08/02/18:  1. No new or progressive findings on today's exam to indicate recurrent or metastatic disease in the thorax. 2. New tree-in-bud nodularity posterior right lung base suggests atypical infection. 3. Dense, solid food material in the esophagus is concerning for dysmotility or stricture more  so than reflux. 4. Emphysema  5.  Aortic Atherosclerois   EKG 04/24/19: sinus bradycardia   CV:  Echo 01/23/18:  - Left ventricle: The cavity size was normal. Wall thickness was normal. Systolic function was normal. The estimated ejection fraction was in the range of 50% to 55%. Moderate hypokinesis of the midanteroseptal myocardium. Doppler parameters are consistent with abnormal left ventricular relaxation (grade 1 diastolic dysfunction).  Cardiac cath 12/11/17:   Prox RCA lesion is 25% stenosed.  Mid LAD lesion is 25% stenosed.  There is moderate left ventricular systolic dysfunction.  LV end diastolic pressure is moderately elevated.  The left ventricular ejection fraction is 25-35% by visual estimate, with apical hypokinesis.  There is no aortic valve stenosis. - Nonobstructive CAD.  Takotsubo cardiomyopathy pattern.  Continue medical therapy.    Past Medical History:  Diagnosis Date  . Adenocarcinoma of right lung, stage 1 ([place]) 07/28/2016  . Anxiety   . Asthma   . Cancer ([place])    uterine  . COPD (chronic obstructive pulmonary disease) ([place])   . Coronary artery disease    mild nonobstructive by 2019 cath  . Cyst    left side of neck  . Depression   . GERD (gastroesophageal reflux disease)   . Heart murmur    per patient; no valvular disease by 2019 echo  . Hyperlipidemia   .  Hypertension   . Low back pain   . Lumbar back pain   . Osteoarthritis   . Osteoporosis   . Takotsubo cardiomyopathy    EF recovered to 50-55% by 01/23/18 echo    Past Surgical History:  Procedure Laterality Date  . ANKLE SURGERY Right    horse accident  . APPENDECTOMY    . BACK SURGERY    . CHOLECYSTECTOMY    . EYE SURGERY     lense implant  . FRACTURE SURGERY    . HIP SURGERY  01/2010   Left Hip   . INNER EAR SURGERY Bilateral 1970   related to severe ear infections  . KNEE SURGERY     Right knee  . LEFT HEART CATH AND CORONARY ANGIOGRAPHY N/A 12/11/2017   Procedure: LEFT  HEART CATH AND CORONARY ANGIOGRAPHY;  Surgeon: Jettie Booze, MD;  Location: [facility];  Service: Cardiovascular;  Laterality: N/A;  . LOBECTOMY Right 05/09/2016   Procedure: RIGHT UPPER LOBECTOMY;  Surgeon: [name], MD;  Location: [facility];  Service: Thoracic;  Laterality: Right;  . MASS EXCISION  08/25/2011   Procedure: EXCISION MASS;  Surgeon: Harl Bowie, MD;  Location: [facility];  Service: General;  Laterality: N/A;  excision left neck mass  . TOTAL ABDOMINAL HYSTERECTOMY    . VIDEO ASSISTED THORACOSCOPY (VATS)/WEDGE RESECTION Right 05/09/2016   Procedure: VIDEO ASSISTED THORACOSCOPY (VATS)/WEDGE RESECTION;  Surgeon: [name], MD;  Location: [facility];  Service: Thoracic;  Laterality: Right;    MEDICATIONS: . albuterol (PROVENTIL HFA;VENTOLIN HFA) 108 (90 BASE) MCG/ACT inhaler  . albuterol (PROVENTIL) (2.5 MG/3ML) 0.083% nebulizer solution  . aluminum-magnesium hydroxide-simethicone (MAALOX) 448-185-63 MG/5ML SUSP  . amLODipine (NORVASC) 5 MG tablet  . atorvastatin (LIPITOR) 20 MG tablet  . diazepam (VALIUM) 5 MG tablet  . Fluticasone-Salmeterol (ADVAIR DISKUS) 250-50 MCG/DOSE AEPB  . gabapentin (NEURONTIN) 400 MG capsule  . ketoconazole (NIZORAL) 2 % cream  . lisinopril (PRINIVIL,ZESTRIL) 10 MG tablet  . meloxicam (MOBIC) 7.5 MG tablet  . metoCLOPramide (REGLAN) 5 MG tablet  . metoprolol tartrate (LOPRESSOR) 25 MG tablet  . Multiple Vitamins-Minerals (WOMENS 50+ ADVANCED PO)  . omega-3 acid ethyl esters (LOVAZA) 1 g capsule  . pantoprazole (PROTONIX) 40 MG tablet  . raloxifene (EVISTA) 60 MG tablet  . sertraline (ZOLOFT) 100 MG tablet  . tiotropium (SPIRIVA) 18 MCG inhalation capsule  . traMADol (ULTRAM) 50 MG tablet  . TRELEGY ELLIPTA 100-62.5-25 MCG/INH AEPB   No current facility-administered medications for this encounter.     If no changes, I anticipate pt can proceed with surgery as scheduled.   Willeen Cass,  FNP-BC [facility]/Anesthesiology Phone: [phone] 05/21/2019 2:09 PM

## 2019-05-23 ENCOUNTER — Inpatient Hospital Stay (HOSPITAL_COMMUNITY): Admission: RE | Admit: 2019-05-23 | Payer: Medicare Other | Source: Ambulatory Visit

## 2019-05-25 ENCOUNTER — Other Ambulatory Visit (HOSPITAL_COMMUNITY)
Admission: RE | Admit: 2019-05-25 | Discharge: 2019-05-25 | Disposition: A | Discharge status: Home / Self Care | Payer: Medicare Other | Source: Ambulatory Visit | Attending: Orthopedic Surgery | Admitting: Orthopedic Surgery

## 2019-05-25 ENCOUNTER — Encounter (HOSPITAL_COMMUNITY): Payer: Self-pay | Admitting: Anesthesiology

## 2019-05-25 DIAGNOSIS — Z20828 Contact with and (suspected) exposure to other viral communicable diseases: Secondary | ICD-10-CM | POA: Insufficient documentation

## 2019-05-25 DIAGNOSIS — Z01812 Encounter for preprocedural laboratory examination: Secondary | ICD-10-CM | POA: Insufficient documentation

## 2019-05-25 LAB — SARS CORONAVIRUS 2 (TAT 6-24 HRS): SARS Coronavirus 2: NEGATIVE

## 2019-05-25 NOTE — Anesthesia Preprocedure Evaluation (Addendum)
Anesthesia Evaluation  Patient identified by MRN, date of birth, ID band Patient awake    Reviewed: Allergy & Precautions, NPO status , Patient's Chart, lab work & pertinent test results, reviewed documented beta blocker date and time   History of Anesthesia Complications Negative for: history of anesthetic complications  Airway Mallampati: II  TM Distance: >3 FB Neck ROM: Full    Dental  (+) Edentulous Lower, Edentulous Upper   Pulmonary COPD,  COPD inhaler and oxygen dependent, former smoker,  lung cancer (s/p VATS/RUL lobectomy   Pulmonary exam normal        Cardiovascular hypertension, Pt. on medications and Pt. on home beta blockers + CAD and + Past MI  Normal cardiovascular exam  hx Takotsubo cardiomyopathy (EF recovered to 50-55% by 01/23/18 echo), nonobstructive CAD (by 2019 cath)  Echo 01/23/18: EF 50-55%, moderate hypokinesis ofthe midanteroseptal myocardium, grade 1 diastolic dysfunction   Neuro/Psych Anxiety Depression S/p lumbar fusion    GI/Hepatic Neg liver ROS, hiatal hernia, GERD  Controlled and Medicated,  Endo/Other  negative endocrine ROS  Renal/GU Renal InsufficiencyRenal disease  negative genitourinary   Musculoskeletal  (+) Arthritis ,   Abdominal   Peds  Hematology negative hematology ROS (+)   Anesthesia Other Findings Day of surgery medications reviewed with patient.  Reproductive/Obstetrics negative OB ROS                            Anesthesia Physical Anesthesia Plan  ASA: III  Anesthesia Plan: Spinal   Post-op Pain Management:  Regional for Post-op pain   Induction:   PONV Risk Score and Plan: Treatment may vary due to age or medical condition, Ondansetron, Propofol infusion and Dexamethasone  Airway Management Planned: Natural Airway and Simple Face Mask  Additional Equipment: None  Intra-op Plan:   Post-operative Plan:   Informed Consent: I have  reviewed the patients History and Physical, chart, labs and discussed the procedure including the risks, benefits and alternatives for the proposed anesthesia with the patient or authorized representative who has indicated his/her understanding and acceptance.     Dental advisory given  Plan Discussed with: CRNA  Anesthesia Plan Comments:        Anesthesia Quick Evaluation

## 2019-05-26 MED ORDER — BUPIVACAINE LIPOSOME 1.3 % IJ SUSP
20.0000 mL | Freq: Once | INTRAMUSCULAR | Status: DC
  Filled 2019-05-26: qty 20

## 2019-05-27 ENCOUNTER — Ambulatory Visit (HOSPITAL_COMMUNITY): Payer: Medicare Other | Admitting: Emergency Medicine

## 2019-05-27 ENCOUNTER — Other Ambulatory Visit: Payer: Self-pay

## 2019-05-27 ENCOUNTER — Encounter (HOSPITAL_COMMUNITY): Payer: Self-pay | Admitting: Emergency Medicine

## 2019-05-27 ENCOUNTER — Ambulatory Visit (HOSPITAL_COMMUNITY): Payer: Medicare Other | Admitting: Anesthesiology

## 2019-05-27 ENCOUNTER — Inpatient Hospital Stay (HOSPITAL_COMMUNITY)
Admission: AD | Admit: 2019-05-27 | Discharge: 2019-06-05 | DRG: 469 | Disposition: A | Discharge status: Skilled Nursing Facility | Payer: Medicare Other | Attending: Orthopedic Surgery | Admitting: Orthopedic Surgery

## 2019-05-27 ENCOUNTER — Encounter (HOSPITAL_COMMUNITY)
Admission: AD | Disposition: A | Discharge status: Skilled Nursing Facility | Payer: Self-pay | Source: Home / Self Care | Attending: Orthopedic Surgery

## 2019-05-27 DIAGNOSIS — Z87891 Personal history of nicotine dependence: Secondary | ICD-10-CM

## 2019-05-27 DIAGNOSIS — M25761 Osteophyte, right knee: Secondary | ICD-10-CM | POA: Diagnosis present

## 2019-05-27 DIAGNOSIS — G9341 Metabolic encephalopathy: Secondary | ICD-10-CM

## 2019-05-27 DIAGNOSIS — I129 Hypertensive chronic kidney disease with stage 1 through stage 4 chronic kidney disease, or unspecified chronic kidney disease: Secondary | ICD-10-CM | POA: Diagnosis present

## 2019-05-27 DIAGNOSIS — Z85118 Personal history of other malignant neoplasm of bronchus and lung: Secondary | ICD-10-CM

## 2019-05-27 DIAGNOSIS — M47817 Spondylosis without myelopathy or radiculopathy, lumbosacral region: Secondary | ICD-10-CM | POA: Diagnosis present

## 2019-05-27 DIAGNOSIS — K219 Gastro-esophageal reflux disease without esophagitis: Secondary | ICD-10-CM | POA: Diagnosis present

## 2019-05-27 DIAGNOSIS — C3491 Malignant neoplasm of unspecified part of right bronchus or lung: Secondary | ICD-10-CM | POA: Diagnosis present

## 2019-05-27 DIAGNOSIS — M5114 Intervertebral disc disorders with radiculopathy, thoracic region: Secondary | ICD-10-CM | POA: Diagnosis present

## 2019-05-27 DIAGNOSIS — J189 Pneumonia, unspecified organism: Secondary | ICD-10-CM | POA: Diagnosis not present

## 2019-05-27 DIAGNOSIS — I2510 Atherosclerotic heart disease of native coronary artery without angina pectoris: Secondary | ICD-10-CM | POA: Diagnosis present

## 2019-05-27 DIAGNOSIS — E785 Hyperlipidemia, unspecified: Secondary | ICD-10-CM | POA: Diagnosis present

## 2019-05-27 DIAGNOSIS — E871 Hypo-osmolality and hyponatremia: Secondary | ICD-10-CM | POA: Diagnosis not present

## 2019-05-27 DIAGNOSIS — N281 Cyst of kidney, acquired: Secondary | ICD-10-CM | POA: Diagnosis present

## 2019-05-27 DIAGNOSIS — K449 Diaphragmatic hernia without obstruction or gangrene: Secondary | ICD-10-CM | POA: Diagnosis present

## 2019-05-27 DIAGNOSIS — R05 Cough: Secondary | ICD-10-CM

## 2019-05-27 DIAGNOSIS — H9190 Unspecified hearing loss, unspecified ear: Secondary | ICD-10-CM | POA: Diagnosis present

## 2019-05-27 DIAGNOSIS — Z881 Allergy status to other antibiotic agents status: Secondary | ICD-10-CM

## 2019-05-27 DIAGNOSIS — Z8249 Family history of ischemic heart disease and other diseases of the circulatory system: Secondary | ICD-10-CM

## 2019-05-27 DIAGNOSIS — R109 Unspecified abdominal pain: Secondary | ICD-10-CM

## 2019-05-27 DIAGNOSIS — Z888 Allergy status to other drugs, medicaments and biological substances status: Secondary | ICD-10-CM

## 2019-05-27 DIAGNOSIS — Z902 Acquired absence of lung [part of]: Secondary | ICD-10-CM

## 2019-05-27 DIAGNOSIS — R079 Chest pain, unspecified: Secondary | ICD-10-CM

## 2019-05-27 DIAGNOSIS — Z79899 Other long term (current) drug therapy: Secondary | ICD-10-CM

## 2019-05-27 DIAGNOSIS — J449 Chronic obstructive pulmonary disease, unspecified: Secondary | ICD-10-CM | POA: Diagnosis present

## 2019-05-27 DIAGNOSIS — G894 Chronic pain syndrome: Secondary | ICD-10-CM | POA: Diagnosis present

## 2019-05-27 DIAGNOSIS — Z86718 Personal history of other venous thrombosis and embolism: Secondary | ICD-10-CM

## 2019-05-27 DIAGNOSIS — Z20828 Contact with and (suspected) exposure to other viral communicable diseases: Secondary | ICD-10-CM | POA: Diagnosis present

## 2019-05-27 DIAGNOSIS — M1711 Unilateral primary osteoarthritis, right knee: Principal | ICD-10-CM | POA: Diagnosis present

## 2019-05-27 DIAGNOSIS — M792 Neuralgia and neuritis, unspecified: Secondary | ICD-10-CM | POA: Diagnosis present

## 2019-05-27 DIAGNOSIS — E7800 Pure hypercholesterolemia, unspecified: Secondary | ICD-10-CM | POA: Diagnosis present

## 2019-05-27 DIAGNOSIS — I252 Old myocardial infarction: Secondary | ICD-10-CM

## 2019-05-27 DIAGNOSIS — F411 Generalized anxiety disorder: Secondary | ICD-10-CM | POA: Diagnosis present

## 2019-05-27 DIAGNOSIS — N183 Chronic kidney disease, stage 3 unspecified: Secondary | ICD-10-CM | POA: Diagnosis present

## 2019-05-27 DIAGNOSIS — R627 Adult failure to thrive: Secondary | ICD-10-CM | POA: Diagnosis not present

## 2019-05-27 DIAGNOSIS — Z7951 Long term (current) use of inhaled steroids: Secondary | ICD-10-CM

## 2019-05-27 DIAGNOSIS — M810 Age-related osteoporosis without current pathological fracture: Secondary | ICD-10-CM | POA: Diagnosis present

## 2019-05-27 DIAGNOSIS — M47816 Spondylosis without myelopathy or radiculopathy, lumbar region: Secondary | ICD-10-CM | POA: Diagnosis present

## 2019-05-27 DIAGNOSIS — Z96659 Presence of unspecified artificial knee joint: Secondary | ICD-10-CM

## 2019-05-27 DIAGNOSIS — Z90710 Acquired absence of both cervix and uterus: Secondary | ICD-10-CM

## 2019-05-27 DIAGNOSIS — Z803 Family history of malignant neoplasm of breast: Secondary | ICD-10-CM

## 2019-05-27 DIAGNOSIS — Z9049 Acquired absence of other specified parts of digestive tract: Secondary | ICD-10-CM

## 2019-05-27 DIAGNOSIS — J9611 Chronic respiratory failure with hypoxia: Secondary | ICD-10-CM | POA: Diagnosis present

## 2019-05-27 DIAGNOSIS — J440 Chronic obstructive pulmonary disease with acute lower respiratory infection: Secondary | ICD-10-CM | POA: Diagnosis not present

## 2019-05-27 DIAGNOSIS — I10 Essential (primary) hypertension: Secondary | ICD-10-CM | POA: Diagnosis present

## 2019-05-27 DIAGNOSIS — Z8542 Personal history of malignant neoplasm of other parts of uterus: Secondary | ICD-10-CM

## 2019-05-27 DIAGNOSIS — R059 Cough, unspecified: Secondary | ICD-10-CM

## 2019-05-27 DIAGNOSIS — M5136 Other intervertebral disc degeneration, lumbar region: Secondary | ICD-10-CM | POA: Diagnosis present

## 2019-05-27 DIAGNOSIS — E876 Hypokalemia: Secondary | ICD-10-CM | POA: Diagnosis not present

## 2019-05-27 DIAGNOSIS — Z791 Long term (current) use of non-steroidal anti-inflammatories (NSAID): Secondary | ICD-10-CM

## 2019-05-27 DIAGNOSIS — F329 Major depressive disorder, single episode, unspecified: Secondary | ICD-10-CM | POA: Diagnosis present

## 2019-05-27 DIAGNOSIS — E559 Vitamin D deficiency, unspecified: Secondary | ICD-10-CM | POA: Diagnosis not present

## 2019-05-27 HISTORY — PX: TOTAL KNEE ARTHROPLASTY: SHX125

## 2019-05-27 SURGERY — ARTHROPLASTY, KNEE, TOTAL
Anesthesia: Spinal | Site: Knee | Laterality: Right

## 2019-05-27 MED ORDER — METOPROLOL TARTRATE 12.5 MG HALF TABLET
12.5000 mg | ORAL_TABLET | Freq: Two times a day (BID) | ORAL | Status: DC
  Administered 2019-05-27 – 2019-06-05 (×19): 12.5 mg via ORAL
  Filled 2019-05-27 (×19): qty 1

## 2019-05-27 MED ORDER — BUPIVACAINE LIPOSOME 1.3 % IJ SUSP
INTRAMUSCULAR | Status: DC | PRN
  Administered 2019-05-27: 20 mL

## 2019-05-27 MED ORDER — ONDANSETRON HCL 4 MG/2ML IJ SOLN
INTRAMUSCULAR | Status: AC
  Filled 2019-05-27: qty 2

## 2019-05-27 MED ORDER — OXYCODONE HCL 5 MG PO TABS
5.0000 mg | ORAL_TABLET | ORAL | Status: DC | PRN
  Administered 2019-05-27 – 2019-05-28 (×3): 5 mg via ORAL
  Administered 2019-05-28 (×2): 10 mg via ORAL
  Administered 2019-05-28: 5 mg via ORAL
  Administered 2019-05-29: 10 mg via ORAL
  Administered 2019-06-01: 11:00:00 5 mg via ORAL
  Filled 2019-05-27: qty 1
  Filled 2019-05-27 (×2): qty 2
  Filled 2019-05-27: qty 1
  Filled 2019-05-27: qty 2
  Filled 2019-05-27 (×3): qty 1

## 2019-05-27 MED ORDER — CHLORHEXIDINE GLUCONATE 4 % EX LIQD: 60.0000 mL | Freq: Once | CUTANEOUS | Status: DC

## 2019-05-27 MED ORDER — GABAPENTIN 400 MG PO CAPS: 400.0000 mg | ORAL_CAPSULE | Freq: Four times a day (QID) | ORAL | Status: DC

## 2019-05-27 MED ORDER — CEFAZOLIN SODIUM-DEXTROSE 2-4 GM/100ML-% IV SOLN
2.0000 g | INTRAVENOUS | Status: DC
  Filled 2019-05-27: qty 100

## 2019-05-27 MED ORDER — DEXAMETHASONE SODIUM PHOSPHATE 10 MG/ML IJ SOLN
8.0000 mg | Freq: Once | INTRAMUSCULAR | Status: AC
  Administered 2019-05-27: 8 mg via INTRAVENOUS

## 2019-05-27 MED ORDER — ACETAMINOPHEN 500 MG PO TABS
1000.0000 mg | ORAL_TABLET | Freq: Four times a day (QID) | ORAL | Status: AC
  Administered 2019-05-27 – 2019-05-28 (×3): 1000 mg via ORAL
  Filled 2019-05-27 (×3): qty 2

## 2019-05-27 MED ORDER — ZOLPIDEM TARTRATE 5 MG PO TABS: 5.0000 mg | ORAL_TABLET | Freq: Every evening | ORAL | Status: DC | PRN

## 2019-05-27 MED ORDER — SERTRALINE HCL 100 MG PO TABS
100.0000 mg | ORAL_TABLET | Freq: Every day | ORAL | Status: DC
  Administered 2019-05-27 – 2019-06-05 (×10): 100 mg via ORAL
  Filled 2019-05-27 (×10): qty 1

## 2019-05-27 MED ORDER — FENTANYL CITRATE (PF) 100 MCG/2ML IJ SOLN
25.0000 ug | INTRAMUSCULAR | Status: DC | PRN
  Administered 2019-05-27 (×3): 50 ug via INTRAVENOUS

## 2019-05-27 MED ORDER — PROPOFOL 10 MG/ML IV BOLUS
INTRAVENOUS | Status: AC
  Filled 2019-05-27: qty 20

## 2019-05-27 MED ORDER — FERROUS SULFATE 325 (65 FE) MG PO TABS
325.0000 mg | ORAL_TABLET | Freq: Three times a day (TID) | ORAL | Status: DC
  Administered 2019-05-27 – 2019-06-05 (×21): 325 mg via ORAL
  Filled 2019-05-27 (×21): qty 1

## 2019-05-27 MED ORDER — SODIUM CHLORIDE 0.9 % IR SOLN
Status: DC | PRN
  Administered 2019-05-27: 1000 mL

## 2019-05-27 MED ORDER — HYDROMORPHONE HCL 1 MG/ML IJ SOLN
0.5000 mg | INTRAMUSCULAR | Status: DC | PRN
  Administered 2019-05-29: 1 mg via INTRAVENOUS
  Administered 2019-05-29: 0.5 mg via INTRAVENOUS
  Filled 2019-05-27 (×2): qty 1

## 2019-05-27 MED ORDER — OXYCODONE HCL 5 MG PO TABS: 5.0000 mg | ORAL_TABLET | Freq: Once | ORAL | Status: DC | PRN

## 2019-05-27 MED ORDER — MENTHOL 3 MG MT LOZG: 1.0000 | LOZENGE | OROMUCOSAL | Status: DC | PRN

## 2019-05-27 MED ORDER — ONDANSETRON HCL 4 MG/2ML IJ SOLN: 4.0000 mg | Freq: Four times a day (QID) | INTRAMUSCULAR | Status: DC | PRN

## 2019-05-27 MED ORDER — BISACODYL 5 MG PO TBEC: 5.0000 mg | DELAYED_RELEASE_TABLET | Freq: Every day | ORAL | Status: DC | PRN

## 2019-05-27 MED ORDER — PROPOFOL 500 MG/50ML IV EMUL
INTRAVENOUS | Status: DC | PRN
  Administered 2019-05-27: 50 ug/kg/min via INTRAVENOUS

## 2019-05-27 MED ORDER — CEFAZOLIN SODIUM-DEXTROSE 2-4 GM/100ML-% IV SOLN
2.0000 g | Freq: Four times a day (QID) | INTRAVENOUS | Status: AC
  Administered 2019-05-27 (×2): 2 g via INTRAVENOUS
  Filled 2019-05-27 (×2): qty 100

## 2019-05-27 MED ORDER — UMECLIDINIUM BROMIDE 62.5 MCG/INH IN AEPB
1.0000 | INHALATION_SPRAY | Freq: Every day | RESPIRATORY_TRACT | Status: DC
  Administered 2019-05-28 – 2019-06-05 (×8): 1 via RESPIRATORY_TRACT
  Filled 2019-05-27 (×2): qty 7

## 2019-05-27 MED ORDER — ACETAMINOPHEN 500 MG PO TABS
1000.0000 mg | ORAL_TABLET | Freq: Once | ORAL | Status: AC
  Administered 2019-05-27: 1000 mg via ORAL
  Filled 2019-05-27: qty 2

## 2019-05-27 MED ORDER — METOPROLOL TARTRATE 12.5 MG HALF TABLET
12.5000 mg | ORAL_TABLET | Freq: Once | ORAL | Status: AC
  Administered 2019-05-27: 12.5 mg via ORAL
  Filled 2019-05-27: qty 1

## 2019-05-27 MED ORDER — FLUTICASONE-UMECLIDIN-VILANT 100-62.5-25 MCG/INH IN AEPB: 1.0000 | INHALATION_SPRAY | Freq: Every day | RESPIRATORY_TRACT | Status: DC

## 2019-05-27 MED ORDER — AMLODIPINE BESYLATE 5 MG PO TABS
5.0000 mg | ORAL_TABLET | Freq: Every day | ORAL | Status: DC
  Administered 2019-05-27 – 2019-06-03 (×8): 5 mg via ORAL
  Filled 2019-05-27 (×8): qty 1

## 2019-05-27 MED ORDER — TRANEXAMIC ACID-NACL 1000-0.7 MG/100ML-% IV SOLN
1000.0000 mg | Freq: Once | INTRAVENOUS | Status: AC
  Administered 2019-05-27: 1000 mg via INTRAVENOUS
  Filled 2019-05-27: qty 100

## 2019-05-27 MED ORDER — ATORVASTATIN CALCIUM 20 MG PO TABS
20.0000 mg | ORAL_TABLET | Freq: Every day | ORAL | Status: DC
  Administered 2019-05-27 – 2019-06-05 (×10): 20 mg via ORAL
  Filled 2019-05-27 (×10): qty 1

## 2019-05-27 MED ORDER — METHOCARBAMOL 500 MG IVPB - SIMPLE MED
500.0000 mg | Freq: Four times a day (QID) | INTRAVENOUS | Status: DC | PRN
  Administered 2019-05-27: 500 mg via INTRAVENOUS
  Filled 2019-05-27: qty 50

## 2019-05-27 MED ORDER — SENNOSIDES-DOCUSATE SODIUM 8.6-50 MG PO TABS: 1.0000 | ORAL_TABLET | Freq: Every evening | ORAL | Status: DC | PRN

## 2019-05-27 MED ORDER — PHENYLEPHRINE HCL (PRESSORS) 10 MG/ML IV SOLN
INTRAVENOUS | Status: AC
  Filled 2019-05-27: qty 1

## 2019-05-27 MED ORDER — TRAMADOL HCL 50 MG PO TABS
50.0000 mg | ORAL_TABLET | Freq: Four times a day (QID) | ORAL | Status: DC
  Administered 2019-05-27 – 2019-06-02 (×19): 50 mg via ORAL
  Filled 2019-05-27 (×21): qty 1

## 2019-05-27 MED ORDER — SODIUM CHLORIDE 0.9% FLUSH
INTRAVENOUS | Status: DC | PRN
  Administered 2019-05-27: 20 mL

## 2019-05-27 MED ORDER — ALBUTEROL SULFATE (2.5 MG/3ML) 0.083% IN NEBU: 2.5000 mg | INHALATION_SOLUTION | RESPIRATORY_TRACT | Status: DC | PRN

## 2019-05-27 MED ORDER — DIPHENHYDRAMINE HCL 12.5 MG/5ML PO ELIX: 12.5000 mg | ORAL_SOLUTION | ORAL | Status: DC | PRN

## 2019-05-27 MED ORDER — CELECOXIB 200 MG PO CAPS
400.0000 mg | ORAL_CAPSULE | Freq: Once | ORAL | Status: AC
  Administered 2019-05-27: 06:00:00 400 mg via ORAL
  Filled 2019-05-27: qty 2

## 2019-05-27 MED ORDER — PHENOL 1.4 % MT LIQD: 1.0000 | OROMUCOSAL | Status: DC | PRN

## 2019-05-27 MED ORDER — FENTANYL CITRATE (PF) 100 MCG/2ML IJ SOLN
INTRAMUSCULAR | Status: AC
  Filled 2019-05-27: qty 2

## 2019-05-27 MED ORDER — BUPIVACAINE IN DEXTROSE 0.75-8.25 % IT SOLN
INTRATHECAL | Status: DC | PRN
  Administered 2019-05-27: 1.6 mL via INTRATHECAL

## 2019-05-27 MED ORDER — GABAPENTIN 300 MG PO CAPS
300.0000 mg | ORAL_CAPSULE | Freq: Once | ORAL | Status: AC
  Administered 2019-05-27: 300 mg via ORAL
  Filled 2019-05-27: qty 1

## 2019-05-27 MED ORDER — TRANEXAMIC ACID-NACL 1000-0.7 MG/100ML-% IV SOLN
1000.0000 mg | INTRAVENOUS | Status: AC
  Administered 2019-05-27: 1000 mg via INTRAVENOUS
  Filled 2019-05-27: qty 100

## 2019-05-27 MED ORDER — BUPIVACAINE HCL (PF) 0.25 % IJ SOLN
INTRAMUSCULAR | Status: AC
  Filled 2019-05-27: qty 30

## 2019-05-27 MED ORDER — SODIUM CHLORIDE 0.9 % IV SOLN
INTRAVENOUS | Status: DC
  Administered 2019-05-27 – 2019-05-28 (×2): via INTRAVENOUS

## 2019-05-27 MED ORDER — FLEET ENEMA 7-19 GM/118ML RE ENEM: 1.0000 | ENEMA | Freq: Once | RECTAL | Status: DC | PRN

## 2019-05-27 MED ORDER — GABAPENTIN 400 MG PO CAPS
400.0000 mg | ORAL_CAPSULE | Freq: Three times a day (TID) | ORAL | Status: DC
  Administered 2019-05-27 – 2019-06-05 (×28): 400 mg via ORAL
  Filled 2019-05-27 (×28): qty 1

## 2019-05-27 MED ORDER — ONDANSETRON HCL 4 MG/2ML IJ SOLN
INTRAMUSCULAR | Status: DC | PRN
  Administered 2019-05-27: 4 mg via INTRAVENOUS

## 2019-05-27 MED ORDER — METHOCARBAMOL 500 MG PO TABS
500.0000 mg | ORAL_TABLET | Freq: Four times a day (QID) | ORAL | Status: DC | PRN
  Administered 2019-05-29 – 2019-06-03 (×5): 500 mg via ORAL
  Filled 2019-05-27 (×5): qty 1

## 2019-05-27 MED ORDER — DOCUSATE SODIUM 100 MG PO CAPS
100.0000 mg | ORAL_CAPSULE | Freq: Two times a day (BID) | ORAL | Status: DC
  Administered 2019-05-27 – 2019-06-05 (×15): 100 mg via ORAL
  Filled 2019-05-27 (×16): qty 1

## 2019-05-27 MED ORDER — PROPOFOL 10 MG/ML IV BOLUS
INTRAVENOUS | Status: DC | PRN
  Administered 2019-05-27: 10 mg via INTRAVENOUS
  Administered 2019-05-27: 20 mg via INTRAVENOUS
  Administered 2019-05-27 (×2): 10 mg via INTRAVENOUS

## 2019-05-27 MED ORDER — PROPOFOL 500 MG/50ML IV EMUL
INTRAVENOUS | Status: AC
  Filled 2019-05-27: qty 50

## 2019-05-27 MED ORDER — BUPIVACAINE-EPINEPHRINE 0.25% -1:200000 IJ SOLN
INTRAMUSCULAR | Status: DC | PRN
  Administered 2019-05-27: 30 mL

## 2019-05-27 MED ORDER — WATER FOR IRRIGATION, STERILE IR SOLN
Status: DC | PRN
  Administered 2019-05-27: 2000 mL

## 2019-05-27 MED ORDER — ASPIRIN EC 325 MG PO TBEC
325.0000 mg | DELAYED_RELEASE_TABLET | Freq: Two times a day (BID) | ORAL | Status: DC
  Administered 2019-05-28 – 2019-06-05 (×17): 325 mg via ORAL
  Filled 2019-05-27 (×17): qty 1

## 2019-05-27 MED ORDER — DEXAMETHASONE SODIUM PHOSPHATE 10 MG/ML IJ SOLN
10.0000 mg | Freq: Once | INTRAMUSCULAR | Status: AC
  Administered 2019-05-28: 10:00:00 10 mg via INTRAVENOUS
  Filled 2019-05-27: qty 1

## 2019-05-27 MED ORDER — METOCLOPRAMIDE HCL 5 MG PO TABS: 5.0000 mg | ORAL_TABLET | Freq: Three times a day (TID) | ORAL | Status: DC | PRN

## 2019-05-27 MED ORDER — FENTANYL CITRATE (PF) 100 MCG/2ML IJ SOLN
50.0000 ug | INTRAMUSCULAR | Status: DC
  Administered 2019-05-27: 08:00:00 50 ug via INTRAVENOUS
  Filled 2019-05-27: qty 2

## 2019-05-27 MED ORDER — OXYCODONE HCL 5 MG/5ML PO SOLN: 5.0000 mg | Freq: Once | ORAL | Status: DC | PRN

## 2019-05-27 MED ORDER — DEXAMETHASONE SODIUM PHOSPHATE 10 MG/ML IJ SOLN
INTRAMUSCULAR | Status: AC
  Filled 2019-05-27: qty 1

## 2019-05-27 MED ORDER — MOMETASONE FURO-FORMOTEROL FUM 200-5 MCG/ACT IN AERO
2.0000 | INHALATION_SPRAY | Freq: Two times a day (BID) | RESPIRATORY_TRACT | Status: DC
  Administered 2019-05-27 – 2019-06-05 (×17): 2 via RESPIRATORY_TRACT
  Filled 2019-05-27: qty 8.8

## 2019-05-27 MED ORDER — BUPIVACAINE-EPINEPHRINE 0.25% -1:200000 IJ SOLN
INTRAMUSCULAR | Status: AC
  Filled 2019-05-27: qty 1

## 2019-05-27 MED ORDER — VANCOMYCIN HCL IN DEXTROSE 1-5 GM/200ML-% IV SOLN
1000.0000 mg | INTRAVENOUS | Status: AC
  Administered 2019-05-27: 1000 mg via INTRAVENOUS
  Filled 2019-05-27: qty 200

## 2019-05-27 MED ORDER — PROMETHAZINE HCL 25 MG/ML IJ SOLN: 6.2500 mg | INTRAMUSCULAR | Status: DC | PRN

## 2019-05-27 MED ORDER — FLUTICASONE FUROATE-VILANTEROL 100-25 MCG/INH IN AEPB
1.0000 | INHALATION_SPRAY | Freq: Every day | RESPIRATORY_TRACT | Status: DC
  Administered 2019-05-28 – 2019-06-05 (×9): 1 via RESPIRATORY_TRACT
  Filled 2019-05-27: qty 28

## 2019-05-27 MED ORDER — MIDAZOLAM HCL 2 MG/2ML IJ SOLN
1.0000 mg | INTRAMUSCULAR | Status: DC
  Administered 2019-05-27: 1 mg via INTRAVENOUS
  Filled 2019-05-27: qty 2

## 2019-05-27 MED ORDER — LISINOPRIL 10 MG PO TABS
10.0000 mg | ORAL_TABLET | Freq: Every morning | ORAL | Status: DC
  Administered 2019-05-27 – 2019-06-05 (×10): 10 mg via ORAL
  Filled 2019-05-27 (×10): qty 1

## 2019-05-27 MED ORDER — BUPIVACAINE-EPINEPHRINE (PF) 0.5% -1:200000 IJ SOLN
INTRAMUSCULAR | Status: DC | PRN
  Administered 2019-05-27: 15 mL via PERINEURAL

## 2019-05-27 MED ORDER — ACETAMINOPHEN 500 MG PO TABS: 1000.0000 mg | ORAL_TABLET | Freq: Once | ORAL | Status: DC

## 2019-05-27 MED ORDER — ALUM & MAG HYDROXIDE-SIMETH 200-200-20 MG/5ML PO SUSP: 30.0000 mL | ORAL | Status: DC | PRN

## 2019-05-27 MED ORDER — TIOTROPIUM BROMIDE MONOHYDRATE 18 MCG IN CAPS: 18.0000 ug | ORAL_CAPSULE | Freq: Every day | RESPIRATORY_TRACT | Status: DC

## 2019-05-27 MED ORDER — RALOXIFENE HCL 60 MG PO TABS
60.0000 mg | ORAL_TABLET | Freq: Every day | ORAL | Status: DC
  Administered 2019-05-27 – 2019-06-05 (×10): 60 mg via ORAL
  Filled 2019-05-27 (×11): qty 1

## 2019-05-27 MED ORDER — POVIDONE-IODINE 10 % EX SWAB
2.0000 "application " | Freq: Once | CUTANEOUS | Status: AC
  Administered 2019-05-27: 2 via TOPICAL

## 2019-05-27 MED ORDER — METOCLOPRAMIDE HCL 5 MG PO TABS
5.0000 mg | ORAL_TABLET | Freq: Two times a day (BID) | ORAL | Status: DC
  Administered 2019-05-27 – 2019-06-05 (×18): 5 mg via ORAL
  Filled 2019-05-27 (×18): qty 1

## 2019-05-27 MED ORDER — DIAZEPAM 5 MG PO TABS
2.5000 mg | ORAL_TABLET | Freq: Three times a day (TID) | ORAL | Status: DC | PRN
  Administered 2019-05-28 – 2019-05-30 (×2): 2.5 mg via ORAL
  Filled 2019-05-27 (×2): qty 1

## 2019-05-27 MED ORDER — PANTOPRAZOLE SODIUM 40 MG PO TBEC
40.0000 mg | DELAYED_RELEASE_TABLET | Freq: Every day | ORAL | Status: DC
  Administered 2019-05-27 – 2019-06-05 (×9): 40 mg via ORAL
  Filled 2019-05-27 (×8): qty 1

## 2019-05-27 MED ORDER — ONDANSETRON HCL 4 MG PO TABS: 4.0000 mg | ORAL_TABLET | Freq: Four times a day (QID) | ORAL | Status: DC | PRN

## 2019-05-27 MED ORDER — LACTATED RINGERS IV SOLN
INTRAVENOUS | Status: DC
  Administered 2019-05-27 (×2): via INTRAVENOUS

## 2019-05-27 MED ORDER — METOCLOPRAMIDE HCL 5 MG/ML IJ SOLN: 5.0000 mg | Freq: Three times a day (TID) | INTRAMUSCULAR | Status: DC | PRN

## 2019-05-27 MED ORDER — METHOCARBAMOL 500 MG IVPB - SIMPLE MED
INTRAVENOUS | Status: AC
  Filled 2019-05-27: qty 50

## 2019-05-27 SURGICAL SUPPLY — 58 items
ARTISURF 11M PLY R 6-9CD KNEE (Knees) ×1 IMPLANT
BAG SPEC THK2 15X12 ZIP CLS (MISCELLANEOUS) ×1
BAG ZIPLOCK 12X15 (MISCELLANEOUS) ×2 IMPLANT
BLADE SAGITTAL 13X1.27X60 (BLADE) ×2 IMPLANT
BLADE SAW SGTL 83.5X18.5 (BLADE) ×2 IMPLANT
BLADE SURG 15 STRL LF DISP TIS (BLADE) ×1 IMPLANT
BLADE SURG 15 STRL SS (BLADE) ×2
BLADE SURG SZ10 CARB STEEL (BLADE) ×4 IMPLANT
BNDG ELASTIC 6X5.8 VLCR STR LF (GAUZE/BANDAGES/DRESSINGS) ×2 IMPLANT
BOWL SMART MIX CTS (DISPOSABLE) ×2 IMPLANT
BSPLAT TIB 5D D CMNT STM RT (Knees) ×1 IMPLANT
CEMENT BONE SIMPLEX SPEEDSET (Cement) ×4 IMPLANT
COVER SURGICAL LIGHT HANDLE (MISCELLANEOUS) ×2 IMPLANT
COVER WAND RF STERILE (DRAPES) IMPLANT
CUFF TOURN SGL QUICK 34 (TOURNIQUET CUFF) ×2
CUFF TRNQT CYL 34X4.125X (TOURNIQUET CUFF) ×1 IMPLANT
DECANTER SPIKE VIAL GLASS SM (MISCELLANEOUS) ×4 IMPLANT
DRAPE INCISE IOBAN 66X45 STRL (DRAPES) ×4 IMPLANT
DRAPE U-SHAPE 47X51 STRL (DRAPES) ×2 IMPLANT
DRSG AQUACEL AG ADV 3.5X10 (GAUZE/BANDAGES/DRESSINGS) ×2 IMPLANT
DURAPREP 26ML APPLICATOR (WOUND CARE) ×4 IMPLANT
ELECT REM PT RETURN 15FT ADLT (MISCELLANEOUS) ×2 IMPLANT
FEMUR  CMT CCR STD SZ6 R KNEE (Knees) ×1 IMPLANT
FEMUR CMT CCR STD SZ6 R KNEE (Knees) ×1 IMPLANT
FEMUR CMTD CCR STD SZ6 R KNEE (Knees) IMPLANT
GLOVE BIOGEL M STRL SZ7.5 (GLOVE) ×2 IMPLANT
GLOVE BIOGEL PI IND STRL 7.5 (GLOVE) ×1 IMPLANT
GLOVE BIOGEL PI IND STRL 8.5 (GLOVE) ×2 IMPLANT
GLOVE BIOGEL PI INDICATOR 7.5 (GLOVE) ×1
GLOVE BIOGEL PI INDICATOR 8.5 (GLOVE) ×2
GLOVE SURG ORTHO 8.0 STRL STRW (GLOVE) ×6 IMPLANT
GOWN STRL REUS W/ TWL XL LVL3 (GOWN DISPOSABLE) ×2 IMPLANT
GOWN STRL REUS W/TWL XL LVL3 (GOWN DISPOSABLE) ×4
HANDPIECE INTERPULSE COAX TIP (DISPOSABLE) ×2
HOLDER FOLEY CATH W/STRAP (MISCELLANEOUS) ×2 IMPLANT
HOOD PEEL AWAY FLYTE STAYCOOL (MISCELLANEOUS) ×6 IMPLANT
KIT TURNOVER KIT A (KITS) IMPLANT
MANIFOLD NEPTUNE II (INSTRUMENTS) ×2 IMPLANT
NEEDLE HYPO 22GX1.5 SAFETY (NEEDLE) ×2 IMPLANT
NS IRRIG 1000ML POUR BTL (IV SOLUTION) ×2 IMPLANT
PACK TOTAL KNEE CUSTOM (KITS) ×2 IMPLANT
PROTECTOR NERVE ULNAR (MISCELLANEOUS) ×2 IMPLANT
SET HNDPC FAN SPRY TIP SCT (DISPOSABLE) ×1 IMPLANT
STEM POLY PAT PLY 32M KNEE (Knees) ×1 IMPLANT
STEM TIBIA 5 DEG SZ D R KNEE (Knees) IMPLANT
STRIP CLOSURE SKIN 1/2X4 (GAUZE/BANDAGES/DRESSINGS) ×2 IMPLANT
SUT BONE WAX W31G (SUTURE) ×2 IMPLANT
SUT MNCRL AB 3-0 PS2 18 (SUTURE) ×2 IMPLANT
SUT STRATAFIX 0 PDS 27 VIOLET (SUTURE) ×2
SUT STRATAFIX PDS+ 0 24IN (SUTURE) ×2 IMPLANT
SUT VIC AB 1 CT1 36 (SUTURE) ×2 IMPLANT
SUTURE STRATFX 0 PDS 27 VIOLET (SUTURE) ×1 IMPLANT
SYR CONTROL 10ML LL (SYRINGE) ×4 IMPLANT
TIBIA STEM 5 DEG SZ D R KNEE (Knees) ×2 IMPLANT
TRAY FOLEY MTR SLVR 16FR STAT (SET/KITS/TRAYS/PACK) ×2 IMPLANT
WATER STERILE IRR 1000ML POUR (IV SOLUTION) ×4 IMPLANT
WRAP KNEE MAXI GEL POST OP (GAUZE/BANDAGES/DRESSINGS) ×2 IMPLANT
YANKAUER SUCT BULB TIP 10FT TU (MISCELLANEOUS) ×2 IMPLANT

## 2019-05-27 NOTE — Anesthesia Procedure Notes (Signed)
Anesthesia Regional Block: Adductor canal block   Pre-Anesthetic Checklist: ,, timeout performed, Correct Patient, Correct Site, Correct Laterality, Correct Procedure, Correct Position, site marked, Risks and benefits discussed, pre-op evaluation,  At surgeon's request and post-op pain management  Laterality: Right  Prep: Maximum Sterile Barrier Precautions used, chloraprep       Needles:  Injection technique: Single-shot  Needle Type: Echogenic Stimulator Needle     Needle Length: 9cm  Needle Gauge: 22     Additional Needles:   Procedures:,,,, ultrasound used (permanent image in chart),,,,  Narrative:  Start time: 05/27/2019 7:45 AM End time: 05/27/2019 7:47 AM Injection made incrementally with aspirations every 5 mL.  Performed by: Personally  Anesthesiologist: Brennan Bailey, MD  Additional Notes: Risks, benefits, and alternative discussed. Patient gave consent for procedure. Patient prepped and draped in sterile fashion. Sedation administered, patient remains easily responsive to voice. Relevant anatomy identified with ultrasound guidance. Local anesthetic given in 5cc increments with no signs or symptoms of intravascular injection. No pain or paraesthesias with injection. Patient monitored throughout procedure with signs of LAST or immediate complications. Tolerated well. Ultrasound image placed in chart.  Tawny Asal, MD

## 2019-05-27 NOTE — H&P (Signed)
Felicia KRAHN MRN:  [id number] DOB/SEX:  September 04, 1940/female  CHIEF COMPLAINT:  Painful right Knee  HISTORY: Patient is a 78 y.o. female presented with a history of pain in the right knee. Onset of symptoms was gradual starting a few years ago with gradually worsening course since that time. Patient has been treated conservatively with over-the-counter NSAIDs and activity modification. Patient currently rates pain in the knee at 10 out of 10 with activity. There is pain at night.  PAST MEDICAL HISTORY: Patient Active Problem List   Diagnosis Date Noted  . 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 Bilateral renal cysts 05/21/2018  . Hiatal hernia 05/21/2018  . 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 Thoracic radiculitis (Bilateral) (L>R) 05/03/2018  . Chronic thoracic back pain (Left) 05/03/2018  . Injury of peripheral nerves of thorax, sequela 03/22/2018  . Left-sided chest wall pain 03/21/2018  . Rib pain (Left) 03/21/2018  . Chronic rib pain (Right) 03/20/2018  . DDD (degenerative disc disease), lumbar 03/20/2018  . Chronic fracture of pubic ramus, sequela (Right) 03/20/2018  . History of femoral neck fracture, sequela (Left) 03/20/2018    Class: History of  . Lumbar facet arthropathy (Bilateral) 03/20/2018  . Lumbar facet syndrome (Bilateral) 03/20/2018  . Osteoarthritis of facet joint of lumbar spine 03/20/2018  . Spondylosis without myelopathy or radiculopathy, lumbosacral region 03/20/2018  . Other specified dorsopathies, sacral and sacrococcygeal region 03/20/2018  . Chronic pain syndrome 02/26/2018  . Cancer related pain 02/26/2018  . 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 Chronic low back pain (Secondary Area of Pain) (Bilateral) 02/26/2018  . 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 Chronic lower extremity pain [facility] Area of Pain) (Bilateral) (R>L) 02/26/2018  . Chronic hip pain (Fourth  Area of Pain) (Bilateral) (R>L) 02/26/2018  . Neurogenic pain 02/26/2018  . Pharmacologic therapy 02/26/2018  . Problems influencing health status 02/26/2018  . Long term prescription benzodiazepine use 02/26/2018  . Long term prescription opiate use 02/26/2018  . Acute systolic heart failure ([place])   . Non-ST elevation (NSTEMI) myocardial infarction ([place])   . History of uterine cancer 12/08/2017  . Generalized anxiety disorder 12/08/2017  . GERD (gastroesophageal reflux disease) 12/08/2017  . Substernal chest pain 12/08/2017  . Elevated brain natriuretic peptide (BNP) level 12/08/2017  . Elevated troponin 12/08/2017  . Embedded tick of upper back excluding scapular region 12/08/2017  . Chronic sacroiliac joint pain 06/21/2017  . Disorder of skeletal system 06/21/2017  . Other long term (current) drug therapy 06/21/2017  . Other specified health status 06/21/2017  . Chronic chest wall pain (Primary Area of Pain) 06/21/2017  . Chronic post-thoracotomy pain 03/03/2017  . 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 Adenocarcinoma of right lung, stage 1 ([place]) 07/28/2016  . S/P lobectomy of lung 05/09/2016  . DVT (deep venous thrombosis) ([place]) 04/26/2016  . Heart murmur   . Acute exacerbation of chronic obstructive pulmonary disease (COPD) ([place]) 08/29/2015  . Essential hypertension 08/29/2015  . Hypercholesterolemia 08/29/2015  . CKD (chronic kidney disease), stage III ([place]) 08/29/2015  . COPD (chronic obstructive pulmonary disease) ([place]) 04/27/2015  . Acute on chronic respiratory failure with hypoxia ([place]) 04/27/2015  . Infected sebaceous cyst 08/16/2011   Past Medical History:  Diagnosis Date  . Adenocarcinoma of right lung, stage 1 ([place]) 07/28/2016  . Anxiety   . Asthma   . Cancer ([place])    uterine  . COPD (chronic obstructive pulmonary disease) ([place])   .  Coronary artery disease    mild nonobstructive by 2019 cath  . Cyst    left side of neck  . Depression   . GERD  (gastroesophageal reflux disease)   . Hyperlipidemia   . Hypertension   . Low back pain   . Osteoarthritis   . Osteoporosis   . Takotsubo cardiomyopathy    EF recovered to 50-55% by 01/23/18 echo   Past Surgical History:  Procedure Laterality Date  . ANKLE SURGERY Right    horse accident  . APPENDECTOMY    . BACK SURGERY    . CHOLECYSTECTOMY    . EYE SURGERY     lense implant  . FRACTURE SURGERY    . HIP SURGERY  01/2010   Left Hip   . INNER EAR SURGERY Bilateral 1970   related to severe ear infections  . KNEE SURGERY     Right knee  . LEFT HEART CATH AND CORONARY ANGIOGRAPHY N/A 12/11/2017   Procedure: LEFT HEART CATH AND CORONARY ANGIOGRAPHY;  Surgeon: Jettie Booze, MD;  Location: [facility];  Service: Cardiovascular;  Laterality: N/A;  . LOBECTOMY Right 05/09/2016   Procedure: RIGHT UPPER LOBECTOMY;  Surgeon: [name], MD;  Location: [facility];  Service: Thoracic;  Laterality: Right;  . MASS EXCISION  08/25/2011   Procedure: EXCISION MASS;  Surgeon: Harl Bowie, MD;  Location: [facility];  Service: General;  Laterality: N/A;  excision left neck mass  . TOTAL ABDOMINAL HYSTERECTOMY    . VIDEO ASSISTED THORACOSCOPY (VATS)/WEDGE RESECTION Right 05/09/2016   Procedure: VIDEO ASSISTED THORACOSCOPY (VATS)/WEDGE RESECTION;  Surgeon: [name], MD;  Location: [facility];  Service: Thoracic;  Laterality: Right;     MEDICATIONS:   Medications Prior to Admission  Medication Sig Dispense Refill Last Dose  . albuterol (PROVENTIL HFA;VENTOLIN HFA) 108 (90 BASE) MCG/ACT inhaler Inhale 2 puffs into the lungs every 6 (six) hours as needed. (Patient taking differently: Inhale 2 puffs into the lungs every 4 (four) hours as needed for wheezing or shortness of breath. ) 18 g 1 05/27/2019 at Unknown time  . amLODipine (NORVASC) 5 MG tablet Take 5 mg by mouth daily.   05/26/2019 at Unknown time  . atorvastatin (LIPITOR) 20 MG tablet Take 20 mg by  mouth daily.    05/26/2019 at Unknown time  . diazepam (VALIUM) 5 MG tablet Take 2.5 mg by mouth every 8 (eight) hours as needed for anxiety (for nerves). Must last 30 days. Filled 11-28-17   Past Week at Unknown time  . Fluticasone-Salmeterol (ADVAIR DISKUS) 250-50 MCG/DOSE AEPB Inhale 1 puff into the lungs every 12 (twelve) hours. 60 each 0 05/27/2019 at Unknown time  . gabapentin (NEURONTIN) 400 MG capsule Take 1 capsule (400 mg total) by mouth 4 (four) times daily. 120 capsule 2 05/26/2019 at Unknown time  . ketoconazole (NIZORAL) 2 % cream Apply 1 application topically daily.   Past Week at Unknown time  . lisinopril (PRINIVIL,ZESTRIL) 10 MG tablet Take 1 tablet (10 mg total) by mouth every morning. 30 tablet 0 05/26/2019 at Unknown time  . metoprolol tartrate (LOPRESSOR) 25 MG tablet Take 0.5 tablets (12.5 mg total) by mouth 2 (two) times daily. 60 tablet 0 05/26/2019 at Unknown time  . Multiple Vitamins-Minerals (WOMENS 50+ ADVANCED PO) Take 1 tablet by mouth daily.   Past Week at Unknown time  . omega-3 acid ethyl esters (LOVAZA) 1 g capsule Take by mouth 2 (two) times daily.   Past  Week at Unknown time  . pantoprazole (PROTONIX) 40 MG tablet Take 1 tablet (40 mg total) by mouth daily at 6 (six) AM. 30 tablet 0 05/26/2019 at Unknown time  . raloxifene (EVISTA) 60 MG tablet Take 60 mg by mouth daily.   05/26/2019 at Unknown time  . sertraline (ZOLOFT) 100 MG tablet Take 100 mg by mouth daily.   05/26/2019 at Unknown time  . tiotropium (SPIRIVA) 18 MCG inhalation capsule Place 18 mcg into inhaler and inhale daily.     05/27/2019 at Unknown time  . traMADol (ULTRAM) 50 MG tablet Take 50 mg by mouth 3 (three) times daily as needed.   Past Week at Unknown time  . TRELEGY ELLIPTA 100-62.5-25 MCG/INH AEPB Inhale 1 puff into the lungs daily.   05/27/2019 at Unknown time  . albuterol (PROVENTIL) (2.5 MG/3ML) 0.083% nebulizer solution Take 3 mLs (2.5 mg total) by nebulization every 2 (two) hours as needed for  wheezing or shortness of breath. 75 mL 12 More than a month at Unknown time  . aluminum-magnesium hydroxide-simethicone (MAALOX) 371-696-78 MG/5ML SUSP Take 30 mLs by mouth as needed (indigestion).   More than a month at Unknown time  . meloxicam (MOBIC) 7.5 MG tablet Take 7.5 mg by mouth daily.   Unknown at Unknown time  . metoCLOPramide (REGLAN) 5 MG tablet Take 5 mg by mouth 2 (two) times daily.    More than a month at Unknown time    ALLERGIES:   Allergies  Allergen Reactions  . Boniva [Ibandronate Sodium] Other (See Comments)    UNSPECIFIED REACTION   . Lyrica [Pregabalin] Nausea And Vomiting    Dizziness and blurred vision as well  . Levofloxacin     REVIEW OF SYSTEMS:  A comprehensive review of systems was negative except for: Musculoskeletal: positive for arthralgias and bone pain   FAMILY HISTORY:   Family History  Problem Relation Age of Onset  . Heart disease Mother   . Heart disease Father   . Cancer Sister        #1, breast  . Heart disease Brother        had CABG    SOCIAL HISTORY:   Social History   Tobacco Use  . Smoking status: Former Smoker    Packs/day: 2.00    Years: 51.00    Pack years: 102.00    Types: Cigarettes    Quit date: 05/04/2012    Years since quitting: 7.0  . Smokeless tobacco: Never Used  Substance Use Topics  . Alcohol use: No    Alcohol/week: 0.0 standard drinks     EXAMINATION:  Vital signs in last 24 hours: Temp:  [98.8 F (37.1 C)] 98.8 F (37.1 C) (11/02 0544) Pulse Rate:  [67] 67 (11/02 0544) Resp:  [14] 14 (11/02 0544) BP: (188)/(103) 188/103 (11/02 0544) SpO2:  [100 %] 100 % (11/02 0544) Weight:  [70.7 kg] 70.7 kg (11/02 0613)  BP (!) 188/103   Pulse 67   Temp 98.8 F (37.1 C) (Oral)   Resp 14   Ht 5\' 2"  (1.575 m)   Wt 70.7 kg   SpO2 100%   BMI 28.50 kg/m   General Appearance:    Alert, cooperative, no distress, appears stated age  Head:    Normocephalic, without obvious abnormality, atraumatic  Eyes:     PERRL, conjunctiva/corneas clear, EOM's intact, fundi    benign, both eyes  Ears:    Normal TM's and external ear canals, both ears  Nose:  Nares normal, septum midline, mucosa normal, no drainage    or sinus tenderness  Throat:   Lips, mucosa, and tongue normal; teeth and gums normal  Neck:   Supple, symmetrical, trachea midline, no adenopathy;    thyroid:  no enlargement/tenderness/nodules; no carotid   bruit or JVD  Back:     Symmetric, no curvature, ROM normal, no CVA tenderness  Lungs:     Clear to auscultation bilaterally, respirations unlabored  Chest Wall:    No tenderness or deformity   Heart:    Regular rate and rhythm, S1 and S2 normal, no murmur, rub   or gallop  Breast Exam:    No tenderness, masses, or nipple abnormality  Abdomen:     Soft, non-tender, bowel sounds active all four quadrants,    no masses, no organomegaly  Genitalia:    Normal female without lesion, discharge or tenderness  Rectal:    Normal tone, no masses or tenderness;   guaiac negative stool  Extremities:   Extremities normal, atraumatic, no cyanosis or edema  Pulses:   2+ and symmetric all extremities  Skin:   Skin color, texture, turgor normal, no rashes or lesions  Lymph nodes:   Cervical, supraclavicular, and axillary nodes normal  Neurologic:   CNII-XII intact, normal strength, sensation and reflexes    throughout    Musculoskeletal:  ROM 0-120, Ligaments intact,  Imaging Review Plain radiographs demonstrate severe degenerative joint disease of the right knee. The overall alignment is neutral. The bone quality appears to be good for age and reported activity level.  Assessment/Plan: Primary osteoarthritis, right knee   The patient history, physical examination and imaging studies are consistent with advanced degenerative joint disease of the right knee. The patient has failed conservative treatment.  The clearance notes were reviewed.  After discussion with the patient it was felt that Total  Knee Replacement was indicated. The procedure,  risks, and benefits of total knee arthroplasty were presented and reviewed. The risks including but not limited to aseptic loosening, infection, blood clots, vascular injury, stiffness, patella tracking problems complications among others were discussed. The patient acknowledged the explanation, agreed to proceed with the plan.  Preoperative templating of the joint replacement has been completed, documented, and submitted to the Operating Room personnel in order to optimize intra-operative equipment management.    Patient's anticipated LOS is less than 2 midnights, meeting these requirements: - Lives within 1 hour of care - Has a competent adult at home to recover with post-op recover - NO history of  - Chronic pain requiring opiods  - Diabetes  - Coronary Artery Disease  - Heart failure  - Heart attack  - Stroke  - DVT/VTE  - Cardiac arrhythmia  - Respiratory Failure/COPD  - Renal failure  - Anemia  - Advanced Liver disease       Donia Ast 05/27/2019, 6:14 AM

## 2019-05-27 NOTE — Evaluation (Signed)
Physical Therapy Evaluation Patient Details Name: [name] MRN: [id number] DOB: 05-09-1941 Today's Date: 05/27/2019   History of Present Illness  s/p R TKA. PMH: VATS 2017, HTN, COPD, back pain-chronic pain, R ankle surgery, L hip surgery  Clinical Impression  Pt is s/p TKA resulting in the deficits listed below (see PT Problem List). Pt requirin gincr time, multi-modal cues for basic tasks. Pt able to perform SLR with less than 10 degree quad lag however unable to WB and extend R knee in standing, performed lateral scooting xfer bed to chair.  May progress more slowly d/t chronic pain issues   Pt will benefit from skilled PT to increase their independence and safety with mobility to allow discharge to the venue listed below.      Follow Up Recommendations Follow surgeon's recommendation for DC plan and follow-up therapies;Supervision/Assistance - 24 hour    Equipment Recommendations  None recommended by PT    Recommendations for Other Services       Precautions / Restrictions Precautions Precautions: Fall;Knee Restrictions Weight Bearing Restrictions: No Other Position/Activity Restrictions: WBAT      Mobility  Bed Mobility Overal bed mobility: Needs Assistance Bed Mobility: Supine to Sit     Supine to sit: Min assist;Mod assist     General bed mobility comments: incr time, cues for technique and to self assist  Transfers Overall transfer level: Needs assistance Equipment used: Rolling walker (2 wheeled) Transfers: Sit to/from Stand;Lateral/Scoot Transfers Sit to Stand: Max assist        Lateral/Scoot Transfers: Min assist General transfer comment: pt with good quad control but unable to WB and and extend RLE in standing, therefore laterally scooted bed to chair--unable to complete stand pivot safely with +1 assist  Ambulation/Gait             General Gait Details: unable  Stairs            Wheelchair Mobility    Modified Rankin (Stroke  Patients Only)       Balance Overall balance assessment: Needs assistance Sitting-balance support: Feet supported;Bilateral upper extremity supported Sitting balance-Leahy Scale: Fair Sitting balance - Comments: intermittent posterior lean     Standing balance-Leahy Scale: Zero                               Pertinent Vitals/Pain Pain Assessment: 0-10 Pain Score: 6  Pain Location: right knee Pain Descriptors / Indicators: Aching;Grimacing;Sore;Other (Comment)(yelling) Pain Intervention(s): Limited activity within patient's tolerance;Monitored during session;Premedicated before session;Repositioned    Home Living Family/patient expects to be discharged to:: Private residence Living Arrangements: Children Available Help at Discharge: Family;Available 24 hours/day(lives with son) Type of Home: House Home Access: Stairs to enter   [employer] of Steps: 2-3 Home Layout: One level Home Equipment: Bedside commode;Walker - 2 wheels;Cane - single point;Wheelchair - manual      Prior Function Level of Independence: Independent               Hand Dominance        Extremity/Trunk Assessment   Upper Extremity Assessment Upper Extremity Assessment: Overall WFL for tasks assessed    Lower Extremity Assessment Lower Extremity Assessment: RLE deficits/detail RLE Deficits / Details: ankle WFL; knee extension and hip flexion 2+/5, pt able to SLR with less than 10 degree lag       Communication   Communication: No difficulties  Cognition Arousal/Alertness: Awake/alert Behavior During Therapy: WFL for tasks  assessed/performed Overall Cognitive Status: No family/caregiver present to determine baseline cognitive functioning Area of Impairment: Attention;Safety/judgement;Following commands;Problem solving                   Current Attention Level: Sustained   Following Commands: Follows one step commands inconsistently;Follows multi-step  commands inconsistently Safety/Judgement: Decreased awareness of safety;Decreased awareness of deficits   Problem Solving: Slow processing;Difficulty sequencing;Requires verbal cues;Requires tactile cues        General Comments      Exercises     Assessment/Plan    PT Assessment Patient needs continued PT services  PT Problem List Decreased range of motion;Decreased activity tolerance;Pain;Decreased knowledge of use of DME;Decreased mobility;Decreased strength;Decreased balance;Decreased safety awareness       PT Treatment Interventions DME instruction;Therapeutic exercise;Gait training;Functional mobility training;Therapeutic activities;Patient/family education;Stair training    PT Goals (Current goals can be found in the Care Plan section)  Acute Rehab PT Goals PT Goal Formulation: With patient Time For Goal Achievement: 06/03/19 Potential to Achieve Goals: Fair    Frequency 7X/week   Barriers to discharge        Co-evaluation               AM-PAC PT "6 Clicks" Mobility  Outcome Measure Help needed turning from your back to your side while in a flat bed without using bedrails?: A Lot Help needed moving from lying on your back to sitting on the side of a flat bed without using bedrails?: A Lot Help needed moving to and from a bed to a chair (including a wheelchair)?: Total Help needed standing up from a chair using your arms (e.g., wheelchair or bedside chair)?: Total Help needed to walk in hospital room?: Total Help needed climbing 3-5 steps with a railing? : Total 6 Click Score: 8    End of Session Equipment Utilized During Treatment: Gait belt Activity Tolerance: Patient limited by pain Patient left: in chair;with chair alarm set;with call bell/phone within reach   PT Visit Diagnosis: Difficulty in walking, not elsewhere classified (R26.2)    Time: 3212-2482 PT Time Calculation (min) (ACUTE ONLY): 25 min   Charges:   PT Evaluation $PT Eval Low  Complexity: 1 Low PT Treatments $Therapeutic Activity: 8-22 mins        Kenyon Ana, PT  Pager: [phone] Acute Rehab Dept [facility]): [phone]   05/27/2019   [facility] 05/27/2019, 2:54 PM

## 2019-05-27 NOTE — Anesthesia Procedure Notes (Signed)
Spinal  Patient location during procedure: OR Start time: 05/27/2019 8:05 AM End time: 05/27/2019 8:10 AM Staffing Anesthesiologist: Brennan Bailey, MD Resident/CRNA: Glory Buff, CRNA Performed: resident/CRNA  Preanesthetic Checklist Completed: patient identified, site marked, surgical consent, pre-op evaluation, timeout performed, IV checked, risks and benefits discussed and monitors and equipment checked Spinal Block Patient position: sitting Prep: DuraPrep Patient monitoring: heart rate, cardiac monitor, continuous pulse ox and blood pressure Approach: midline Location: L3-4 Injection technique: single-shot Needle Needle type: Pencan  Needle gauge: 24 G Needle length: 9 cm Assessment Sensory level: T6 Additional Notes Kit expiration date checked and verified.  Sterile prep and draped, Skin local with 1% lidocaine, - heme, - paraesthesia, +CSF pre and post injection, patient tolerated procedure well.

## 2019-05-27 NOTE — Progress Notes (Signed)
Assisted Dr. Howze with right, ultrasound guided, adductor canal block. Side rails up, monitors on throughout procedure. See vital signs in flow sheet. Tolerated Procedure well.  

## 2019-05-27 NOTE — Transfer of Care (Signed)
Immediate Anesthesia Transfer of Care Note  Patient: [name]  Procedure(s) Performed: TOTAL KNEE ARTHROPLASTY (Right Knee)  Patient Location: PACU  Anesthesia Type:Spinal  Level of Consciousness: awake, alert  and oriented  Airway & Oxygen Therapy: Patient Spontanous Breathing and Patient connected to nasal cannula oxygen  Post-op Assessment: Report given to RN and Post -op Vital signs reviewed and stable  Post vital signs: Reviewed and stable  Last Vitals:  Vitals Value Taken Time  BP 147/69 05/27/19 1001  Temp    Pulse 56 05/27/19 1003  Resp 18 05/27/19 1003  SpO2 100 % 05/27/19 1003  Vitals shown include unvalidated device data.  Last Pain:  Vitals:   05/27/19 0544  TempSrc: Oral  PainSc:       Patients Stated Pain Goal: 4 (44/36/01 6580)  Complications: No apparent anesthesia complications

## 2019-05-27 NOTE — Anesthesia Postprocedure Evaluation (Signed)
Anesthesia Post Note  Patient: [name]  Procedure(s) Performed: TOTAL KNEE ARTHROPLASTY (Right Knee)     Patient location during evaluation: PACU Anesthesia Type: Spinal Level of consciousness: awake and alert and oriented Pain management: pain level controlled Vital Signs Assessment: post-procedure vital signs reviewed and stable Respiratory status: spontaneous breathing, nonlabored ventilation and respiratory function stable Cardiovascular status: blood pressure returned to baseline Postop Assessment: no apparent nausea or vomiting, spinal receding, no headache and no backache Anesthetic complications: no    Last Vitals:  Vitals:   05/27/19 1015 05/27/19 1030  BP: (!) 163/82 (!) 176/80  Pulse: (!) 54 (!) 53  Resp: 16 14  Temp:    SpO2: 100% 100%    Last Pain:  Vitals:   05/27/19 1030  TempSrc:   PainSc: 0-No pain                 Brennan Bailey

## 2019-05-27 NOTE — Plan of Care (Signed)
Plan of care 

## 2019-05-27 NOTE — Anesthesia Procedure Notes (Signed)
Date/Time: 05/27/2019 8:01 AM Performed by: Glory Buff, CRNA Oxygen Delivery Method: Nasal cannula

## 2019-05-27 NOTE — Op Note (Signed)
TOTAL KNEE REPLACEMENT OPERATIVE NOTE:  05/27/2019  10:59 AM  PATIENT:  [name]  78 y.o. female  PRE-OPERATIVE DIAGNOSIS:  Primary Osteoarthritis Right Knee  POST-OPERATIVE DIAGNOSIS:  primary osteoarthritis right knee  PROCEDURE:  Procedure(s): TOTAL KNEE ARTHROPLASTY  SURGEON:  Surgeon(s): Vickey Huger, MD  PHYSICIAN ASSISTANT: Nehemiah Massed, PA-C  ANESTHESIA:   spinal  SPECIMEN: None  COUNTS:  Correct  TOURNIQUET:   Total Tourniquet Time Documented: Thigh (Right) - 36 minutes Total: Thigh (Right) - 36 minutes   DICTATION:  Indication for procedure:    The patient is a 78 y.o. female who has failed conservative treatment for Primary Osteoarthritis Right Knee.  Informed consent was obtained prior to anesthesia. The risks versus benefits of the operation were explain and in a way the patient can, and did, understand.    Description of procedure:     The patient was taken to the operating room and placed under anesthesia.  The patient was positioned in the usual fashion taking care that all body parts were adequately padded and/or protected.  A tourniquet was applied and the leg prepped and draped in the usual sterile fashion.  The extremity was exsanguinated with the esmarch and tourniquet inflated to 350 mmHg.  Pre-operative range of motion was normal.    A midline incision approximately 6-7 inches long was made with a #10 blade.  A new blade was used to make a parapatellar arthrotomy going 2-3 cm into the quadriceps tendon, over the patella, and alongside the medial aspect of the patellar tendon.  A synovectomy was then performed with the #10 blade and forceps. I then elevated the deep MCL off the medial tibial metaphysis subperiosteally around to the semimembranosus attachment.    I everted the patella and used calipers to measure patellar thickness.  I used the reamer to ream down to appropriate thickness to recreate the native thickness.  I then removed excess  bone with the rongeur and sagittal saw.  I used the appropriately sized template and drilled the three lug holes.  I then put the trial in place and measured the thickness with the calipers to ensure recreation of the native thickness.  The trial was then removed and the patella subluxed and the knee brought into flexion.  A homan retractor was place to retract and protect the patella and lateral structures.  A Z-retractor was place medially to protect the medial structures.  The extra-medullary alignment system was used to make cut the tibial articular surface perpendicular to the anamotic axis of the tibia and in 3 degrees of posterior slope.  The cut surface and alignment jig was removed.  I then used the intramedullary alignment guide to make a  valgus cut on the distal femur.  I then marked out the epicondylar axis on the distal femur.  I then used the anterior referencing sizer and measured the femur to be a size 6.  The 4-In-1 cutting block was screwed into place in external rotation matching the posterior condylar angle, making our cuts perpendicular to the epicondylar axis.  Anterior, posterior and chamfer cuts were made with the sagittal saw.  The cutting block and cut pieces were removed.  A lamina spreader was placed in 90 degrees of flexion.  The ACL, PCL, menisci, and posterior condylar osteophytes were removed.  A 11 mm spacer blocked was found to offer good flexion and extension gap balance after minimal in degree releasing.   The scoop retractor was then placed and the femoral  finishing block was pinned in place.  The small sagittal saw was used as well as the lug drill to finish the femur.  The block and cut surfaces were removed and the medullary canal hole filled with autograft bone from the cut pieces.  The tibia was delivered forward in deep flexion and external rotation.  A size D tray was selected and pinned into place centered on the medial 1/3 of the tibial tubercle.  The reamer and  keel was used to prepare the tibia through the tray.    I then trialed with the size 6 femur, size D tibia, a 11 mm insert and the 32 patella.  I had excellent flexion/extension gap balance, excellent patella tracking.  Flexion was full and beyond 120 degrees; extension was zero.  These components were chosen and the staff opened them to me on the back table while the knee was lavaged copiously and the cement mixed.  The soft tissue was infiltrated with 60cc of exparel 1.3% through a 21 gauge needle.  I cemented in the components and removed all excess cement.  The polyethylene tibial component was snapped into place and the knee placed in extension while cement was hardening.  The capsule was infilltrated with a 60cc exparel/marcaine/saline mixture.   Once the cement was hard, the tourniquet was let down.  Hemostasis was obtained.  The arthrotomy was closed using a #1 stratofix running suture.  The deep soft tissues were closed with #0 vicryls and the subcuticular layer closed with #2-0 vicryl.  The skin was reapproximated and closed with 3.0 Monocryl.  The wound was covered with steristrips, aquacel dressing, and a TED stocking.   The patient was then awakened, extubated, and taken to the recovery room in stable condition.  BLOOD LOSS:  354TG COMPLICATIONS:  None.  PLAN OF CARE: Admit for overnight observation  PATIENT DISPOSITION:  PACU - hemodynamically stable.     Please fax a copy of this op note to my office at [phone] (please only include page 1 and 2 of the Case Information op note)

## 2019-05-28 ENCOUNTER — Encounter (HOSPITAL_COMMUNITY): Payer: Self-pay | Admitting: Orthopedic Surgery

## 2019-05-28 DIAGNOSIS — R627 Adult failure to thrive: Secondary | ICD-10-CM | POA: Diagnosis not present

## 2019-05-28 DIAGNOSIS — M47816 Spondylosis without myelopathy or radiculopathy, lumbar region: Secondary | ICD-10-CM | POA: Diagnosis present

## 2019-05-28 DIAGNOSIS — M5114 Intervertebral disc disorders with radiculopathy, thoracic region: Secondary | ICD-10-CM | POA: Diagnosis present

## 2019-05-28 DIAGNOSIS — M25761 Osteophyte, right knee: Secondary | ICD-10-CM | POA: Diagnosis present

## 2019-05-28 DIAGNOSIS — N183 Chronic kidney disease, stage 3 unspecified: Secondary | ICD-10-CM | POA: Diagnosis present

## 2019-05-28 DIAGNOSIS — J189 Pneumonia, unspecified organism: Secondary | ICD-10-CM | POA: Diagnosis not present

## 2019-05-28 DIAGNOSIS — M5136 Other intervertebral disc degeneration, lumbar region: Secondary | ICD-10-CM | POA: Diagnosis present

## 2019-05-28 DIAGNOSIS — I129 Hypertensive chronic kidney disease with stage 1 through stage 4 chronic kidney disease, or unspecified chronic kidney disease: Secondary | ICD-10-CM | POA: Diagnosis present

## 2019-05-28 DIAGNOSIS — E559 Vitamin D deficiency, unspecified: Secondary | ICD-10-CM | POA: Diagnosis not present

## 2019-05-28 DIAGNOSIS — E785 Hyperlipidemia, unspecified: Secondary | ICD-10-CM | POA: Diagnosis present

## 2019-05-28 DIAGNOSIS — J440 Chronic obstructive pulmonary disease with acute lower respiratory infection: Secondary | ICD-10-CM | POA: Diagnosis not present

## 2019-05-28 DIAGNOSIS — R52 Pain, unspecified: Secondary | ICD-10-CM | POA: Diagnosis not present

## 2019-05-28 DIAGNOSIS — E876 Hypokalemia: Secondary | ICD-10-CM | POA: Diagnosis not present

## 2019-05-28 DIAGNOSIS — M810 Age-related osteoporosis without current pathological fracture: Secondary | ICD-10-CM | POA: Diagnosis present

## 2019-05-28 DIAGNOSIS — Z20828 Contact with and (suspected) exposure to other viral communicable diseases: Secondary | ICD-10-CM | POA: Diagnosis present

## 2019-05-28 DIAGNOSIS — G9341 Metabolic encephalopathy: Secondary | ICD-10-CM | POA: Diagnosis not present

## 2019-05-28 DIAGNOSIS — M792 Neuralgia and neuritis, unspecified: Secondary | ICD-10-CM | POA: Diagnosis present

## 2019-05-28 DIAGNOSIS — I2510 Atherosclerotic heart disease of native coronary artery without angina pectoris: Secondary | ICD-10-CM | POA: Diagnosis present

## 2019-05-28 DIAGNOSIS — I252 Old myocardial infarction: Secondary | ICD-10-CM | POA: Diagnosis not present

## 2019-05-28 DIAGNOSIS — C3491 Malignant neoplasm of unspecified part of right bronchus or lung: Secondary | ICD-10-CM | POA: Diagnosis not present

## 2019-05-28 DIAGNOSIS — R05 Cough: Secondary | ICD-10-CM | POA: Diagnosis not present

## 2019-05-28 DIAGNOSIS — J9611 Chronic respiratory failure with hypoxia: Secondary | ICD-10-CM | POA: Diagnosis present

## 2019-05-28 DIAGNOSIS — R609 Edema, unspecified: Secondary | ICD-10-CM | POA: Diagnosis not present

## 2019-05-28 DIAGNOSIS — N281 Cyst of kidney, acquired: Secondary | ICD-10-CM | POA: Diagnosis present

## 2019-05-28 DIAGNOSIS — M47817 Spondylosis without myelopathy or radiculopathy, lumbosacral region: Secondary | ICD-10-CM | POA: Diagnosis present

## 2019-05-28 DIAGNOSIS — E871 Hypo-osmolality and hyponatremia: Secondary | ICD-10-CM | POA: Diagnosis not present

## 2019-05-28 DIAGNOSIS — K219 Gastro-esophageal reflux disease without esophagitis: Secondary | ICD-10-CM | POA: Diagnosis present

## 2019-05-28 DIAGNOSIS — M1711 Unilateral primary osteoarthritis, right knee: Secondary | ICD-10-CM | POA: Diagnosis present

## 2019-05-28 LAB — BASIC METABOLIC PANEL
Anion gap: 9 (ref 5–15)
BUN: 16 mg/dL (ref 8–23)
CO2: 24 mmol/L (ref 22–32)
Calcium: 8.3 mg/dL — ABNORMAL LOW (ref 8.9–10.3)
Chloride: 106 mmol/L (ref 98–111)
Creatinine, Ser: 1.16 mg/dL — ABNORMAL HIGH (ref 0.44–1.00)
GFR calc Af Amer: 53 mL/min — ABNORMAL LOW (ref >60)
GFR calc non Af Amer: 46 mL/min — ABNORMAL LOW (ref >60)
Glucose, Bld: 97 mg/dL (ref 70–99)
Potassium: 3.7 mmol/L (ref 3.5–5.1)
Sodium: 139 mmol/L (ref 135–145)

## 2019-05-28 LAB — CBC
HCT: 31.7 % — ABNORMAL LOW (ref 36.0–46.0)
Hemoglobin: 10 g/dL — ABNORMAL LOW (ref 12.0–15.0)
MCH: 28.4 pg (ref 26.0–34.0)
MCHC: 31.5 g/dL (ref 30.0–36.0)
MCV: 90.1 fL (ref 80.0–100.0)
Platelets: 240 10*3/uL (ref 150–400)
RBC: 3.52 MIL/uL — ABNORMAL LOW (ref 3.87–5.11)
RDW: 15.6 % — ABNORMAL HIGH (ref 11.5–15.5)
WBC: 9.7 10*3/uL (ref 4.0–10.5)
nRBC: 0 % (ref 0.0–0.2)

## 2019-05-28 MED ORDER — ASPIRIN 325 MG PO TBEC: 325.0000 mg | DELAYED_RELEASE_TABLET | Freq: Two times a day (BID) | ORAL | 0 refills | Status: DC

## 2019-05-28 MED ORDER — TIZANIDINE HCL 2 MG PO TABS: 2.0000 mg | ORAL_TABLET | Freq: Four times a day (QID) | ORAL | 0 refills | Status: DC | PRN

## 2019-05-28 MED ORDER — ACETAMINOPHEN 500 MG PO TABS: 1000.0000 mg | ORAL_TABLET | Freq: Four times a day (QID) | ORAL | 0 refills | Status: AC

## 2019-05-28 MED ORDER — OXYCODONE HCL 5 MG PO TABS: 5.0000 mg | ORAL_TABLET | Freq: Four times a day (QID) | ORAL | 0 refills | Status: DC | PRN

## 2019-05-28 NOTE — Progress Notes (Signed)
Physical Therapy Treatment Patient Details Name: [name] MRN: [id number] DOB: June 20, 1941 Today's Date: 05/28/2019    History of Present Illness 78 yo s/p R TKA 05/27/19. PMH: VATS 2017, HTN, COPD, back pain-chronic pain, R ankle surgery, L hip surgery    PT Comments    Progressing slowly with mobility. Pt is unsteady and unsafe. She is at risk for falls when mobilizing. Will continue to follow and progress activity as tolerated. Do not anticipate pt meeting her PT goals on today.    Follow Up Recommendations  Follow surgeon's recommendation for DC plan and follow-up therapies;Supervision/Assistance - 24 hour     Equipment Recommendations  None recommended by PT    Recommendations for Other Services       Precautions / Restrictions Precautions Precautions: Fall;Knee Restrictions Weight Bearing Restrictions: No Other Position/Activity Restrictions: WBAT    Mobility  Bed Mobility Overal bed mobility: Needs Assistance Bed Mobility: Sit to Supine       Sit to supine: Min assist;HOB elevated   General bed mobility comments: Assist for R LE. Cues for safety, technique.  Transfers Overall transfer level: Needs assistance Equipment used: Rolling walker (2 wheeled) Transfers: Sit to/from Stand Sit to Stand: Min assist         General transfer comment: Assist to rise, stabilize, control descent. VCs safety, technique, hand placement. Unsteady and unsafe.  Ambulation/Gait Ambulation/Gait assistance: Min assist;+2 safety/equipment Gait Distance (Feet): 10 Feet Assistive device: Rolling walker (2 wheeled) Gait Pattern/deviations: Step-to pattern;Trunk flexed;Antalgic     General Gait Details: VCs safety, technique, sequence, posture, proper use of RW. Followed closely with recliner. Assist to stabilize pt throughout distance. Pt fatigues easily. Distance also limited by pain. Unsteady and unsafe.   Stairs             Wheelchair Mobility    Modified  Rankin (Stroke Patients Only)       Balance Overall balance assessment: Needs assistance         Standing balance support: Bilateral upper extremity supported Standing balance-Leahy Scale: Poor                              Cognition Arousal/Alertness: Awake/alert Behavior During Therapy: WFL for tasks assessed/performed Overall Cognitive Status: No family/caregiver present to determine baseline cognitive functioning Area of Impairment: Attention;Safety/judgement;Following commands;Problem solving                   Current Attention Level: Sustained   Following Commands: Follows one step commands inconsistently Safety/Judgement: Decreased awareness of safety;Decreased awareness of deficits   Problem Solving: Slow processing;Difficulty sequencing;Requires verbal cues;Requires tactile cues        Exercises Total Joint Exercises Ankle Circles/Pumps: AROM;Both;10 reps;Seated Quad Sets: AROM;Both;10 reps;Seated Heel Slides: AAROM;Right;10 reps;Seated Straight Leg Raises: AROM;AAROM;Right;10 reps;Seated Goniometric ROM: ~10-60 degrees    General Comments        Pertinent Vitals/Pain Pain Assessment: Faces Faces Pain Scale: Hurts even more Pain Location: R knee Pain Descriptors / Indicators: Aching;Sore;Sharp(yelling out) Pain Intervention(s): Limited activity within patient's tolerance;Monitored during session;Ice applied    Home Living                      Prior Function            PT Goals (current goals can now be found in the care plan section) Progress towards PT goals: Progressing toward goals    Frequency    7X/week  PT Plan Current plan remains appropriate    Co-evaluation              AM-PAC PT "6 Clicks" Mobility   Outcome Measure  Help needed turning from your back to your side while in a flat bed without using bedrails?: A Little Help needed moving from lying on your back to sitting on the side of a  flat bed without using bedrails?: A Little Help needed moving to and from a bed to a chair (including a wheelchair)?: A Little Help needed standing up from a chair using your arms (e.g., wheelchair or bedside chair)?: A Little Help needed to walk in hospital room?: A Lot Help needed climbing 3-5 steps with a railing? : A Lot 6 Click Score: 16    End of Session Equipment Utilized During Treatment: Gait belt Activity Tolerance: Patient limited by pain;Patient limited by fatigue Patient left: in bed;with call bell/phone within reach;with bed alarm set   PT Visit Diagnosis: Pain;Other abnormalities of gait and mobility (R26.89) Pain - Right/Left: Right Pain - part of body: Knee     Time: 6045-4098 PT Time Calculation (min) (ACUTE ONLY): 31 min  Charges:  $Gait Training: 8-22 mins $Therapeutic Exercise: 8-22 mins                       Weston Anna, PT Acute Rehabilitation Services Pager: [phone] Office: [phone]  '

## 2019-05-28 NOTE — Discharge Summary (Deleted)
SPORTS MEDICINE & JOINT REPLACEMENT   [name], MD   Carlyon Shadow, PA-C [address], [place], [place]  [address]                             [phone]  PATIENT ID: [name]        MRN:  [id number]          DOB/AGE: [birth year] / 78 y.o.    DISCHARGE SUMMARY  ADMISSION DATE:    05/27/2019 DISCHARGE DATE:   05/31/2019   ADMISSION DIAGNOSIS: Primary Osteoarthritis Right Knee    DISCHARGE DIAGNOSIS:  Primary Osteoarthritis Right Knee    ADDITIONAL DIAGNOSIS: Active Problems:   S/P total knee replacement  Past Medical History:  Diagnosis Date  . Adenocarcinoma of right lung, stage 1 ([facility]) 07/28/2016  . Anxiety   . Asthma   . Cancer ([facility])    uterine  . COPD (chronic obstructive pulmonary disease) ([facility])   . Coronary artery disease    mild nonobstructive by 2019 cath  . Cyst    left side of neck  . Depression   . GERD (gastroesophageal reflux disease)   . Hyperlipidemia   . Hypertension   . Low back pain   . Osteoarthritis   . Osteoporosis   . Takotsubo cardiomyopathy    EF recovered to 50-55% by 01/23/18 echo    PROCEDURE: Procedure(s): TOTAL KNEE ARTHROPLASTY on 05/27/2019  CONSULTS:    HISTORY:  See H&P in chart  HOSPITAL COURSE:  [name] is a 78 y.o. admitted on 05/27/2019 and found to have a diagnosis of Primary Osteoarthritis Right Knee.  After appropriate laboratory studies were obtained  they were taken to the operating room on 05/27/2019 and underwent Procedure(s): TOTAL KNEE ARTHROPLASTY.   They were given perioperative antibiotics:  Anti-infectives (From admission, onward)   Start     Dose/Rate Route Frequency Ordered Stop   05/27/19 1400  ceFAZolin (ANCEF) IVPB 2g/100 mL premix     2 g 200 mL/hr over 30 Minutes Intravenous Every 6 hours 05/27/19 1157 05/27/19 2200   05/27/19 0600  vancomycin (VANCOCIN) IVPB 1000 mg/200 mL premix     1,000 mg 200 mL/hr over 60 Minutes Intravenous On call to O.R. 05/27/19 7209 05/27/19 0833   05/27/19 0600  ceFAZolin (ANCEF) IVPB 2g/100 mL premix  Status:  Discontinued     2 g 200 mL/hr over 30 Minutes Intravenous On call to O.R. 05/27/19 0522 05/27/19 1156    .  Patient given tranexamic acid IV or topical and exparel intra-operatively.  Tolerated the procedure well.    POD# 1: Vital signs were stable.  Patient denied Chest pain, shortness of breath, or calf pain.  Patient was started on Aspirin twice daily at 8am.  Consults to PT, OT, and care management were made.  The patient was weight bearing as tolerated.  CPM was placed on the operative leg 0-90 degrees for 6-8 hours a day. When out of the CPM, patient was placed in the foam block to achieve full extension. Incentive spirometry was taught.  Dressing was changed.       POD #2, Continued  PT for ambulation and exercise program.  IV saline locked.  O2 discontinued.    The remainder of the hospital course was dedicated to ambulation and strengthening.   The patient was discharged on 4 days post op.  in  Good condition.  Blood products given:none  DIAGNOSTIC  STUDIES: Recent vital signs:  Patient Vitals for the past 24 hrs:  BP Temp Temp src Pulse Resp SpO2  05/28/19 0531 (!) 170/71 98 F (36.7 C) Oral (!) 59 16 100 %  05/28/19 0235 (!) 167/71 (!) 97.4 F (36.3 C) Oral (!) 58 16 100 %  05/27/19 2126 128/67 98.1 F (36.7 C) Oral 62 - 100 %  05/27/19 2017 - - - - - 99 %  05/27/19 1455 (!) 159/80 97.9 F (36.6 C) Oral 61 17 99 %  05/27/19 1402 (!) 196/84 (!) 97.5 F (36.4 C) Oral 64 17 98 %  05/27/19 1303 (!) 184/91 98.2 F (36.8 C) Axillary 61 17 100 %  05/27/19 1156 (!) 171/86 97.8 F (36.6 C) Oral (!) 55 16 100 %  05/27/19 1130 (!) 158/86 - - (!) 58 (!) 21 99 %  05/27/19 1115 (!) 145/78 - - (!) 53 10 100 %  05/27/19 1100 140/87 - - (!) 54 18 100 %  05/27/19 1045 (!) 175/86 - - (!) 52 14 100 %  05/27/19 1030 (!) 176/80 - - (!) 53 14 100 %  05/27/19 1015 (!) 163/82 - - (!) 54 16 100 %  05/27/19 1000 (!) 147/69  98 F (36.7 C) - (!) 58 (!) 9 98 %  05/27/19 0747 - - - (!) 58 11 97 %  05/27/19 0746 - - - (!) 56 14 95 %  05/27/19 0745 (!) 145/70 - - (!) 56 11 -  05/27/19 0744 - - - (!) 56 10 97 %  05/27/19 0743 - - - (!) 56 11 97 %  05/27/19 0742 - - - (!) 55 14 99 %  05/27/19 0741 - - - (!) 56 15 98 %  05/27/19 0740 (!) 179/73 - - (!) 59 13 97 %  05/27/19 0739 - - - (!) 57 (!) 24 96 %  05/27/19 0738 - - - (!) 58 19 96 %  05/27/19 0737 - - - (!) 58 15 95 %  05/27/19 0736 - - - 63 16 95 %  05/27/19 0735 (!) 155/72 - - (!) 57 15 96 %  05/27/19 0734 - - - (!) 58 14 95 %  05/27/19 0733 - - - (!) 58 18 95 %  05/27/19 0732 - - - (!) 57 18 96 %  05/27/19 0731 - - - 60 17 97 %  05/27/19 0730 (!) 158/94 - - (!) 58 18 95 %       Recent laboratory studies: Recent Labs    05/21/19 1224 05/28/19 0240  WBC 6.8 9.7  HGB 11.3* 10.0*  HCT 36.7 31.7*  PLT 322 240   Recent Labs    05/21/19 1224 05/28/19 0240  NA 140 139  K 3.7 3.7  CL 105 106  CO2 26 24  BUN 14 16  CREATININE 1.03* 1.16*  GLUCOSE 94 97  CALCIUM 8.5* 8.3*   Lab Results  Component Value Date   INR 1.00 12/09/2017   INR 0.99 05/05/2016   INR 1.38 04/08/2010     Recent Radiographic Studies :  No results found.  DISCHARGE INSTRUCTIONS: Discharge Instructions    Call MD / Call 911   Complete by: As directed    If you experience chest pain or shortness of breath, CALL 911 and be transported to the hospital emergency room.  If you develope a fever above 101 F, pus (white drainage) or increased drainage or redness at the wound, or calf pain, call your surgeon's  office.   Constipation Prevention   Complete by: As directed    Drink plenty of fluids.  Prune juice may be helpful.  You may use a stool softener, such as Colace (over the counter) 100 mg twice a day.  Use MiraLax (over the counter) for constipation as needed.   Diet - low sodium heart healthy   Complete by: As directed    Discharge instructions   Complete by: As  directed    INSTRUCTIONS AFTER JOINT REPLACEMENT   Remove items at home which could result in a fall. This includes throw rugs or furniture in walking pathways ICE to the affected joint every three hours while awake for 30 minutes at a time, for at least the first 3-5 days, and then as needed for pain and swelling.  Continue to use ice for pain and swelling. You may notice swelling that will progress down to the foot and ankle.  This is normal after surgery.  Elevate your leg when you are not up walking on it.   Continue to use the breathing machine you got in the hospital (incentive spirometer) which will help keep your temperature down.  It is common for your temperature to cycle up and down following surgery, especially at night when you are not up moving around and exerting yourself.  The breathing machine keeps your lungs expanded and your temperature down.   DIET:  As you were doing prior to hospitalization, we recommend a well-balanced diet.  DRESSING / WOUND CARE / SHOWERING  Keep the surgical dressing until follow up.  The dressing is water proof, so you can shower without any extra covering.  IF THE DRESSING FALLS OFF or the wound gets wet inside, change the dressing with sterile gauze.  Please use good hand washing techniques before changing the dressing.  Do not use any lotions or creams on the incision until instructed by your surgeon.    ACTIVITY  Increase activity slowly as tolerated, but follow the weight bearing instructions below.   No driving for 6 weeks or until further direction given by your physician.  You cannot drive while taking narcotics.  No lifting or carrying greater than 10 lbs. until further directed by your surgeon. Avoid periods of inactivity such as sitting longer than an hour when not asleep. This helps prevent blood clots.  You may return to work once you are authorized by your doctor.     WEIGHT BEARING   Weight bearing as tolerated with assist device  (walker, cane, etc) as directed, use it as long as suggested by your surgeon or therapist, typically at least 4-6 weeks.   EXERCISES  Results after joint replacement surgery are often greatly improved when you follow the exercise, range of motion and muscle strengthening exercises prescribed by your doctor. Safety measures are also important to protect the joint from further injury. Any time any of these exercises cause you to have increased pain or swelling, decrease what you are doing until you are comfortable again and then slowly increase them. If you have problems or questions, call your caregiver or physical therapist for advice.   Rehabilitation is important following a joint replacement. After just a few days of immobilization, the muscles of the leg can become weakened and shrink (atrophy).  These exercises are designed to build up the tone and strength of the thigh and leg muscles and to improve motion. Often times heat used for twenty to thirty minutes before working out will loosen up your  tissues and help with improving the range of motion but do not use heat for the first two weeks following surgery (sometimes heat can increase post-operative swelling).   These exercises can be done on a training (exercise) mat, on the floor, on a table or on a bed. Use whatever works the best and is most comfortable for you.    Use music or television while you are exercising so that the exercises are a pleasant break in your day. This will make your life better with the exercises acting as a break in your routine that you can look forward to.   Perform all exercises about fifteen times, three times per day or as directed.  You should exercise both the operative leg and the other leg as well.   Exercises include:   Quad Sets - Tighten up the muscle on the front of the thigh (Quad) and hold for 5-10 seconds.   Straight Leg Raises - With your knee straight (if you were given a brace, keep it on), lift the  leg to 60 degrees, hold for 3 seconds, and slowly lower the leg.  Perform this exercise against resistance later as your leg gets stronger.  Leg Slides: Lying on your back, slowly slide your foot toward your buttocks, bending your knee up off the floor (only go as far as is comfortable). Then slowly slide your foot back down until your leg is flat on the floor again.  Angel Wings: Lying on your back spread your legs to the side as far apart as you can without causing discomfort.  Hamstring Strength:  Lying on your back, push your heel against the floor with your leg straight by tightening up the muscles of your buttocks.  Repeat, but this time bend your knee to a comfortable angle, and push your heel against the floor.  You may put a pillow under the heel to make it more comfortable if necessary.   A rehabilitation program following joint replacement surgery can speed recovery and prevent re-injury in the future due to weakened muscles. Contact your doctor or a physical therapist for more information on knee rehabilitation.    CONSTIPATION  Constipation is defined medically as fewer than three stools per week and severe constipation as less than one stool per week.  Even if you have a regular bowel pattern at home, your normal regimen is likely to be disrupted due to multiple reasons following surgery.  Combination of anesthesia, postoperative narcotics, change in appetite and fluid intake all can affect your bowels.   YOU MUST use at least one of the following options; they are listed in order of increasing strength to get the job done.  They are all available over the counter, and you may need to use some, POSSIBLY even all of these options:    Drink plenty of fluids (prune juice may be helpful) and high fiber foods Colace 100 mg by mouth twice a day  Senokot for constipation as directed and as needed Dulcolax (bisacodyl), take with full glass of water  Miralax (polyethylene glycol) once or twice  a day as needed.  If you have tried all these things and are unable to have a bowel movement in the first 3-4 days after surgery call either your surgeon or your primary doctor.    If you experience loose stools or diarrhea, hold the medications until you stool forms back up.  If your symptoms do not get better within 1 week or if they get worse,  check with your doctor.  If you experience "the worst abdominal pain ever" or develop nausea or vomiting, please contact the office immediately for further recommendations for treatment.   ITCHING:  If you experience itching with your medications, try taking only a single pain pill, or even half a pain pill at a time.  You can also use Benadryl over the counter for itching or also to help with sleep.   TED HOSE STOCKINGS:  Use stockings on both legs until for at least 2 weeks or as directed by physician office. They may be removed at night for sleeping.  MEDICATIONS:  See your medication summary on the "After Visit Summary" that nursing will review with you.  You may have some home medications which will be placed on hold until you complete the course of blood thinner medication.  It is important for you to complete the blood thinner medication as prescribed.  PRECAUTIONS:  If you experience chest pain or shortness of breath - call 911 immediately for transfer to the hospital emergency department.   If you develop a fever greater that 101 F, purulent drainage from wound, increased redness or drainage from wound, foul odor from the wound/dressing, or calf pain - CONTACT YOUR SURGEON.                                                   FOLLOW-UP APPOINTMENTS:  If you do not already have a post-op appointment, please call the office for an appointment to be seen by your surgeon.  Guidelines for how soon to be seen are listed in your "After Visit Summary", but are typically between 1-4 weeks after surgery.  OTHER INSTRUCTIONS:   Knee Replacement:  Do not  place pillow under knee, focus on keeping the knee straight while resting. CPM instructions: 0-90 degrees, 2 hours in the morning, 2 hours in the afternoon, and 2 hours in the evening. Place foam block, curve side up under heel at all times except when in CPM or when walking.  DO NOT modify, tear, cut, or change the foam block in any way.  MAKE SURE YOU:  Understand these instructions.  Get help right away if you are not doing well or get worse.    Thank you for letting us be a part of your medical care team.  It is a privilege we respect greatly.  We hope these instructions will help you stay on track for a fast and full recovery!   Increase activity slowly as tolerated   Complete by: As directed       DISCHARGE MEDICATIONS:   Allergies as of 05/28/2019      Reactions   Boniva [ibandronate Sodium] Other (See Comments)   UNSPECIFIED REACTION    Lyrica [pregabalin] Nausea And Vomiting   Dizziness and blurred vision as well   Levofloxacin       Medication List    STOP taking these medications   meloxicam 7.5 MG tablet Commonly known as: MOBIC     TAKE these medications   acetaminophen 500 MG tablet Commonly known as: TYLENOL Take 2 tablets (1,000 mg total) by mouth every 6 (six) hours.   albuterol 108 (90 Base) MCG/ACT inhaler Commonly known as: VENTOLIN HFA Inhale 2 puffs into the lungs every 6 (six) hours as needed. What changed:   when to take this  reasons to take this   albuterol (2.5 MG/3ML) 0.083% nebulizer solution Commonly known as: PROVENTIL Take 3 mLs (2.5 mg total) by nebulization every 2 (two) hours as needed for wheezing or shortness of breath. What changed: Another medication with the same name was changed. Make sure you understand how and when to take each.   aluminum-magnesium hydroxide-simethicone 174-081-44 MG/5ML Susp Commonly known as: MAALOX Take 30 mLs by mouth as needed (indigestion).   amLODipine 5 MG tablet Commonly known as: NORVASC Take  5 mg by mouth daily.   aspirin 325 MG EC tablet Take 1 tablet (325 mg total) by mouth 2 (two) times daily.   atorvastatin 20 MG tablet Commonly known as: LIPITOR Take 20 mg by mouth daily.   diazepam 5 MG tablet Commonly known as: VALIUM Take 2.5 mg by mouth every 8 (eight) hours as needed for anxiety (for nerves). Must last 30 days. Filled 11-28-17   Fluticasone-Salmeterol 250-50 MCG/DOSE Aepb Commonly known as: Advair Diskus Inhale 1 puff into the lungs every 12 (twelve) hours.   gabapentin 400 MG capsule Commonly known as: NEURONTIN Take 1 capsule (400 mg total) by mouth 4 (four) times daily.   ketoconazole 2 % cream Commonly known as: NIZORAL Apply 1 application topically daily.   lisinopril 10 MG tablet Commonly known as: ZESTRIL Take 1 tablet (10 mg total) by mouth every morning.   metoCLOPramide 5 MG tablet Commonly known as: REGLAN Take 5 mg by mouth 2 (two) times daily.   metoprolol tartrate 25 MG tablet Commonly known as: LOPRESSOR Take 0.5 tablets (12.5 mg total) by mouth 2 (two) times daily.   omega-3 acid ethyl esters 1 g capsule Commonly known as: LOVAZA Take by mouth 2 (two) times daily.   oxyCODONE 5 MG immediate release tablet Commonly known as: Oxy IR/ROXICODONE Take 1-2 tablets (5-10 mg total) by mouth every 6 (six) hours as needed for moderate pain (pain score 4-6).   pantoprazole 40 MG tablet Commonly known as: PROTONIX Take 1 tablet (40 mg total) by mouth daily at 6 (six) AM.   raloxifene 60 MG tablet Commonly known as: EVISTA Take 60 mg by mouth daily.   sertraline 100 MG tablet Commonly known as: ZOLOFT Take 100 mg by mouth daily.   tiotropium 18 MCG inhalation capsule Commonly known as: SPIRIVA Place 18 mcg into inhaler and inhale daily.   tiZANidine 2 MG tablet Commonly known as: ZANAFLEX Take 1 tablet (2 mg total) by mouth every 6 (six) hours as needed.   traMADol 50 MG tablet Commonly known as: ULTRAM Take 50 mg by mouth 3  (three) times daily as needed.   Trelegy Ellipta 100-62.5-25 MCG/INH Aepb Generic drug: Fluticasone-Umeclidin-Vilant Inhale 1 puff into the lungs daily.   WOMENS 50+ ADVANCED PO Take 1 tablet by mouth daily.            Durable Medical Equipment  (From admission, onward)         Start     Ordered   05/27/19 1158  DME Walker rolling  Once    Question:  Patient needs a walker to treat with the following condition  Answer:  S/P total knee replacement   05/27/19 1157   05/27/19 1158  DME 3 n 1  Once     05/27/19 1157   05/27/19 1158  DME Bedside commode  Once    Question:  Patient needs a bedside commode to treat with the following condition  Answer:  S/P total knee replacement  05/27/19 1157          FOLLOW UP VISIT:    DISPOSITION: HOME VS. SNF  CONDITION:  Good   Donia Ast 05/28/2019, 7:20 AM

## 2019-05-28 NOTE — Progress Notes (Signed)
SPORTS MEDICINE AND JOINT REPLACEMENT  [name], MD    Carlyon Shadow, PA-C [address], [place], [place]  [address]                             [phone]   PROGRESS NOTE  Subjective:  negative for Chest Pain  negative for Shortness of Breath  negative for Nausea/Vomiting   negative for Calf Pain  negative for Bowel Movement   Tolerating Diet: yes         Patient reports pain as 3 on 0-10 scale.    Objective: Vital signs in last 24 hours:    Patient Vitals for the past 24 hrs:  BP Temp Temp src Pulse Resp SpO2  05/28/19 0531 (!) 170/71 98 F (36.7 C) Oral (!) 59 16 100 %  05/28/19 0235 (!) 167/71 (!) 97.4 F (36.3 C) Oral (!) 58 16 100 %  05/27/19 2126 128/67 98.1 F (36.7 C) Oral 62 - 100 %  05/27/19 2017 - - - - - 99 %  05/27/19 1455 (!) 159/80 97.9 F (36.6 C) Oral 61 17 99 %  05/27/19 1402 (!) 196/84 (!) 97.5 F (36.4 C) Oral 64 17 98 %  05/27/19 1303 (!) 184/91 98.2 F (36.8 C) Axillary 61 17 100 %  05/27/19 1156 (!) 171/86 97.8 F (36.6 C) Oral (!) 55 16 100 %  05/27/19 1130 (!) 158/86 - - (!) 58 (!) 21 99 %  05/27/19 1115 (!) 145/78 - - (!) 53 10 100 %  05/27/19 1100 140/87 - - (!) 54 18 100 %  05/27/19 1045 (!) 175/86 - - (!) 52 14 100 %  05/27/19 1030 (!) 176/80 - - (!) 53 14 100 %  05/27/19 1015 (!) 163/82 - - (!) 54 16 100 %  05/27/19 1000 (!) 147/69 98 F (36.7 C) - (!) 58 (!) 9 98 %  05/27/19 0747 - - - (!) 58 11 97 %  05/27/19 0746 - - - (!) 56 14 95 %  05/27/19 0745 (!) 145/70 - - (!) 56 11 -  05/27/19 0744 - - - (!) 56 10 97 %  05/27/19 0743 - - - (!) 56 11 97 %  05/27/19 0742 - - - (!) 55 14 99 %  05/27/19 0741 - - - (!) 56 15 98 %  05/27/19 0740 (!) 179/73 - - (!) 59 13 97 %  05/27/19 0739 - - - (!) 57 (!) 24 96 %  05/27/19 0738 - - - (!) 58 19 96 %  05/27/19 0737 - - - (!) 58 15 95 %  05/27/19 0736 - - - 63 16 95 %  05/27/19 0735 (!) 155/72 - - (!) 57 15 96 %  05/27/19 0734 - - - (!) 58 14 95 %  05/27/19 0733 - - - (!) 58  18 95 %  05/27/19 0732 - - - (!) 57 18 96 %  05/27/19 0731 - - - 60 17 97 %  05/27/19 0730 (!) 158/94 - - (!) 58 18 95 %    @flow {1959:LAST@   Intake/Output from previous day:   11/02 0701 - 11/03 0700 In: 2939.8 [P.O.:360; I.V.:2032.5] Out: 1425 [Urine:1400]   Intake/Output this shift:   No intake/output data recorded.   Intake/Output      11/02 0701 - 11/03 0700 11/03 0701 - 11/04 0700   P.O. 360    I.V. (mL/kg) 2032.5 (28.7)  IV Piggyback 547.3    Total Intake(mL/kg) 2939.8 (41.6)    Urine (mL/kg/hr) 1400 (0.8)    Blood 25    Total Output 1425    Net +1514.8            LABORATORY DATA: Recent Labs    05/21/19 1224 05/28/19 0240  WBC 6.8 9.7  HGB 11.3* 10.0*  HCT 36.7 31.7*  PLT 322 240   Recent Labs    05/21/19 1224 05/28/19 0240  NA 140 139  K 3.7 3.7  CL 105 106  CO2 26 24  BUN 14 16  CREATININE 1.03* 1.16*  GLUCOSE 94 97  CALCIUM 8.5* 8.3*   Lab Results  Component Value Date   INR 1.00 12/09/2017   INR 0.99 05/05/2016   INR 1.38 04/08/2010    Examination:  General appearance: alert, cooperative and no distress Extremities: extremities normal, atraumatic, no cyanosis or edema  Wound Exam: clean, dry, intact   Drainage:  None: wound tissue dry  Motor Exam: Quadriceps and Hamstrings Intact  Sensory Exam: Superficial Peroneal, Deep Peroneal and Tibial normal   Assessment:    1 Day Post-Op  Procedure(s) (LRB): TOTAL KNEE ARTHROPLASTY (Right)  ADDITIONAL DIAGNOSIS:  Active Problems:   S/P total knee replacement     Plan: Physical Therapy as ordered Weight Bearing as Tolerated (WBAT)  DVT Prophylaxis:  Aspirin  DISCHARGE PLAN: Home   Patient may D/C home today if she passes PT     Patient's anticipated LOS is less than 2 midnights, meeting these requirements: - Lives within 1 hour of care - Has a competent adult at home to recover with post-op recover - NO history of  - Chronic pain requiring opiods  - Diabetes  -  Coronary Artery Disease  - Heart failure  - Heart attack  - Stroke  - DVT/VTE  - Cardiac arrhythmia  - Respiratory Failure/COPD  - Renal failure  - Anemia  - Advanced Liver disease        Donia Ast 05/28/2019, 7:17 AM

## 2019-05-28 NOTE — Progress Notes (Signed)
Physical Therapy Treatment Patient Details Name: [name] MRN: [id number] DOB: 02-15-1941 Today's Date: 05/28/2019    History of Present Illness 78 yo s/p R TKA 05/27/19. PMH: VATS 2017, HTN, COPD, back pain-chronic pain, R ankle surgery, L hip surgery    PT Comments    Pt has not met her PT goals. She is a high fall risk. She demonstrates poor safety awareness. Pt c/o moderate-severe pain during both sessions. She is progressing very slowly. Will continue to follow and progress activity as safely able.    Follow Up Recommendations  Follow surgeon's recommendation for DC plan and follow-up therapies;Supervision/Assistance - 24 hour     Equipment Recommendations  None recommended by PT    Recommendations for Other Services       Precautions / Restrictions Precautions Precautions: Fall;Knee Restrictions Weight Bearing Restrictions: No Other Position/Activity Restrictions: WBAT    Mobility  Bed Mobility Overal bed mobility: Needs Assistance Bed Mobility: Supine to Sit;Sit to Supine     Supine to sit: Min assist Sit to supine: Min assist   General bed mobility comments: Assist for R LE. Cues for safety, technique. Increased time. Multiple verbal cues for attention to task/completion of task.  Transfers Overall transfer level: Needs assistance Equipment used: Rolling walker (2 wheeled) Transfers: Sit to/from Stand Sit to Stand: Min assist        Lateral/Scoot Transfers: Min assist General transfer comment: Assist to rise, stabilize, control descent. VCs safety, technique, hand placement. Unsteady and unsafe.  Ambulation/Gait Ambulation/Gait assistance: Min assist;+2 safety/equipment Gait Distance (Feet): 10 Feet Assistive device: Rolling walker (2 wheeled) Gait Pattern/deviations: Step-to pattern;Trunk flexed;Antalgic     General Gait Details: VCs safety, technique, sequence, posture, proper use of RW.  Assist to stabilize pt and manage RW throughout  distance. Pt fatigues easily. Distance also limited by pain. Unsteady and unsafe.   Stairs             Wheelchair Mobility    Modified Rankin (Stroke Patients Only)       Balance Overall balance assessment: Needs assistance;History of Falls         Standing balance support: Bilateral upper extremity supported Standing balance-Leahy Scale: Poor                              Cognition Arousal/Alertness: Awake/alert Behavior During Therapy: WFL for tasks assessed/performed Overall Cognitive Status: No family/caregiver present to determine baseline cognitive functioning Area of Impairment: Attention;Safety/judgement;Following commands;Problem solving                   Current Attention Level: Sustained   Following Commands: Follows one step commands inconsistently Safety/Judgement: Decreased awareness of safety;Decreased awareness of deficits   Problem Solving: Slow processing;Difficulty sequencing;Requires verbal cues;Requires tactile cues        Exercises Total Joint Exercises Ankle Circles/Pumps: AROM;Both;10 reps;Seated Quad Sets: AROM;Both;10 reps;Seated Heel Slides: AAROM;Right;10 reps;Seated Straight Leg Raises: AROM;AAROM;Right;10 reps;Seated Goniometric ROM: ~10-60 degrees    General Comments        Pertinent Vitals/Pain Pain Assessment: Faces Faces Pain Scale: Hurts whole lot Pain Location: R knee Pain Descriptors / Indicators: Aching;Sore;Sharp(yelling out) Pain Intervention(s): Limited activity within patient's tolerance;Monitored during session;Ice applied;Repositioned    Home Living                      Prior Function            PT Goals (current goals  can now be found in the care plan section) Progress towards PT goals: Progressing toward goals    Frequency    7X/week      PT Plan Current plan remains appropriate    Co-evaluation              AM-PAC PT "6 Clicks" Mobility   Outcome  Measure  Help needed turning from your back to your side while in a flat bed without using bedrails?: A Little Help needed moving from lying on your back to sitting on the side of a flat bed without using bedrails?: A Little Help needed moving to and from a bed to a chair (including a wheelchair)?: A Little Help needed standing up from a chair using your arms (e.g., wheelchair or bedside chair)?: A Little Help needed to walk in hospital room?: A Lot Help needed climbing 3-5 steps with a railing? : A Lot 6 Click Score: 16    End of Session Equipment Utilized During Treatment: Gait belt Activity Tolerance: Patient limited by fatigue;Patient limited by pain Patient left: in bed;with call bell/phone within reach;with bed alarm set   PT Visit Diagnosis: Pain;Other abnormalities of gait and mobility (R26.89) Pain - Right/Left: Right Pain - part of body: Knee     Time: 1340-1401 PT Time Calculation (min) (ACUTE ONLY): 21 min  Charges:  $Gait Training: 8-22 mins $Therapeutic Exercise: 8-22 mins                       Weston Anna, PT Acute Rehabilitation Services Pager: [phone] Office: [phone]

## 2019-05-28 NOTE — Progress Notes (Signed)
Therapy Plan: Prearranged in Ortho office [facility] Has DME

## 2019-05-29 LAB — CBC
HCT: 34.1 % — ABNORMAL LOW (ref 36.0–46.0)
Hemoglobin: 10.5 g/dL — ABNORMAL LOW (ref 12.0–15.0)
MCH: 27.5 pg (ref 26.0–34.0)
MCHC: 30.8 g/dL (ref 30.0–36.0)
MCV: 89.3 fL (ref 80.0–100.0)
Platelets: 254 10*3/uL (ref 150–400)
RBC: 3.82 MIL/uL — ABNORMAL LOW (ref 3.87–5.11)
RDW: 15.7 % — ABNORMAL HIGH (ref 11.5–15.5)
WBC: 12.9 10*3/uL — ABNORMAL HIGH (ref 4.0–10.5)
nRBC: 0 % (ref 0.0–0.2)

## 2019-05-29 NOTE — Progress Notes (Signed)
Physical Therapy Treatment Patient Details Name: [name] MRN: [id number] DOB: 1940/11/03 Today's Date: 05/29/2019    History of Present Illness 78 yo s/p R TKA 05/27/19. PMH: VATS 2017, HTN, COPD, back pain-chronic pain, R ankle surgery, L hip surgery    PT Comments    Pt is not progressing. She required +2 assist for mobility this a.m. She remains at high risk for falls. She continues to exhibit poor safety awareness/attention to task. Will continue to follow and progress activity as able. May need to consider SNF placement.     Follow Up Recommendations  Follow surgeon's recommendation for DC plan and follow-up therapies;Supervision/Assistance - 24 hour(may need to consider SNF placement)     Equipment Recommendations  None recommended by PT    Recommendations for Other Services       Precautions / Restrictions Precautions Precautions: Fall;Knee Restrictions Weight Bearing Restrictions: No Other Position/Activity Restrictions: WBAT    Mobility  Bed Mobility Overal bed mobility: Needs Assistance Bed Mobility: Supine to Sit     Supine to sit: Mod assist;HOB elevated     General bed mobility comments: Assist for R LE and scoot to EOB. Cues for safety, technique. Increased time. Multiple verbal cues for attention to task/completion of task.  Transfers Overall transfer level: Needs assistance Equipment used: Rolling walker (2 wheeled) Transfers: Sit to/from Stand Sit to Stand: Mod assist;+2 physical assistance;+2 safety/equipment Stand pivot transfers: Mod assist;+2 physical assistance;+2 safety/equipment       General transfer comment: Assist to rise, stabilize, control descent. VCs attention to task, safety, technique, hand placement, posture, and to maintain extended R knee. Unsteady and unsafe.  Ambulation/Gait             General Gait Details: Pt unable to safely attempt this session.   Stairs             Wheelchair Mobility     Modified Rankin (Stroke Patients Only)       Balance Overall balance assessment: Needs assistance         Standing balance support: Bilateral upper extremity supported Standing balance-Leahy Scale: Poor                              Cognition Arousal/Alertness: Awake/alert Behavior During Therapy: WFL for tasks assessed/performed Overall Cognitive Status: No family/caregiver present to determine baseline cognitive functioning Area of Impairment: Attention;Safety/judgement;Following commands;Problem solving                   Current Attention Level: Sustained   Following Commands: Follows one step commands inconsistently Safety/Judgement: Decreased awareness of safety;Decreased awareness of deficits   Problem Solving: Slow processing;Difficulty sequencing;Requires verbal cues;Requires tactile cues        Exercises Total Joint Exercises Ankle Circles/Pumps: AROM;Both;10 reps;Supine Short Arc Quad: AAROM;Right;10 reps;Seated Heel Slides: AAROM;Right;10 reps;Seated Hip ABduction/ADduction: AAROM;Right;10 reps;Seated Straight Leg Raises: AAROM;Right;10 reps;Seated Goniometric ROM: ~10-55 degrees    General Comments        Pertinent Vitals/Pain Pain Assessment: Faces Faces Pain Scale: Hurts whole lot Pain Location: R knee Pain Descriptors / Indicators: Burning;Aching;Sore;Discomfort(yelling out) Pain Intervention(s): Limited activity within patient's tolerance;Repositioned;Ice applied    Home Living                      Prior Function            PT Goals (current goals can now be found in the care plan section) Acute  Rehab PT Goals PT Goal Formulation: With patient Progress towards PT goals: Not progressing toward goals - comment(high fall risk, poor attention to task/safety awareness, difficulty with pain)    Frequency    7X/week      PT Plan Current plan remains appropriate    Co-evaluation              AM-PAC  PT "6 Clicks" Mobility   Outcome Measure  Help needed turning from your back to your side while in a flat bed without using bedrails?: A Little Help needed moving from lying on your back to sitting on the side of a flat bed without using bedrails?: A Lot Help needed moving to and from a bed to a chair (including a wheelchair)?: A Lot Help needed standing up from a chair using your arms (e.g., wheelchair or bedside chair)?: A Lot Help needed to walk in hospital room?: A Lot Help needed climbing 3-5 steps with a railing? : Total 6 Click Score: 12    End of Session Equipment Utilized During Treatment: Gait belt Activity Tolerance: Patient limited by fatigue;Patient limited by pain Patient left: in chair;with call bell/phone within reach;with chair alarm set   PT Visit Diagnosis: Pain;Other abnormalities of gait and mobility (R26.89);Difficulty in walking, not elsewhere classified (R26.2);History of falling (Z91.81) Pain - Right/Left: Right Pain - part of body: Knee     Time: 0051-1021 PT Time Calculation (min) (ACUTE ONLY): 27 min  Charges:  $Therapeutic Exercise: 8-22 mins $Therapeutic Activity: 8-22 mins                       Weston Anna, PT Acute Rehabilitation Services Pager: [phone] Office: [phone]

## 2019-05-29 NOTE — Progress Notes (Signed)
Physical Therapy Treatment Patient Details Name: [name] MRN: [id number] DOB: 1941-01-01 Today's Date: 05/29/2019    History of Present Illness 78 yo s/p R TKA 05/27/19. PMH: VATS 2017, HTN, COPD, back pain-chronic pain, R ankle surgery, L hip surgery    PT Comments    Pt is not progressing with therapy. She remains a high fall risk. She continues to exhibit poor safety awareness. She continues to c/o significant pain despite being medicated. Will continue to follow and progress activity as tolerated. May need to consider SNF placement.     Follow Up Recommendations  Follow surgeon's recommendation for DC plan and follow-up therapies;Supervision/Assistance - 24 hour     Equipment Recommendations  None recommended by PT    Recommendations for Other Services       Precautions / Restrictions Precautions Precautions: Fall;Knee Restrictions Weight Bearing Restrictions: No Other Position/Activity Restrictions: WBAT    Mobility  Bed Mobility Overal bed mobility: Needs Assistance Bed Mobility: Sit to Supine       Sit to supine: Mod assist;+2 for physical assistance   General bed mobility comments: Assist for trunk and bil LEs. Increased time. Multimodal cueing required for attention to task/completion of task.  Transfers Overall transfer level: Needs assistance Equipment used: Rolling walker (2 wheeled) Transfers: Sit to/from Stand Sit to Stand: Mod assist;+2 physical assistance;+2 safety/equipment Stand pivot transfers: Mod assist;+2 physical assistance;+2 safety/equipment       General transfer comment: Assist to rise, stabilize, control descent. VCs attention to task, safety, technique, hand placement, posture, and to maintain extended R knee. Unsteady and unsafe.  Ambulation/Gait  Mod assist; +2 physical assistance; +2 safety/equipment Gait Distance (Feet): 3 Feet Assistive device: Rolling walker (2 wheeled) Gait Pattern/deviations: Step-to pattern;Trunk  flexed;Antalgic     General Gait Details: Assist to stabilize pt and manage RW. Limited by pain. Cues for safety and for pt not to release grip on walker. Very unsafe. High fall risk.   Stairs             Wheelchair Mobility    Modified Rankin (Stroke Patients Only)       Balance Overall balance assessment: Needs assistance;History of Falls         Standing balance support: Bilateral upper extremity supported Standing balance-Leahy Scale: Poor                              Cognition Arousal/Alertness: Awake/alert Behavior During Therapy: WFL for tasks assessed/performed Overall Cognitive Status: No family/caregiver present to determine baseline cognitive functioning Area of Impairment: Attention;Safety/judgement;Following commands;Problem solving                   Current Attention Level: Sustained   Following Commands: Follows one step commands inconsistently Safety/Judgement: Decreased awareness of safety;Decreased awareness of deficits   Problem Solving: Slow processing;Difficulty sequencing;Requires verbal cues;Requires tactile cues        Exercises      General Comments        Pertinent Vitals/Pain Pain Assessment: Faces Faces Pain Scale: Hurts whole lot Pain Location: R knee Pain Descriptors / Indicators: Burning;Aching;Sore;Discomfort Pain Intervention(s): Limited activity within patient's tolerance;Repositioned;Ice applied    Home Living                      Prior Function            PT Goals (current goals can now be found in the care plan section)  Progress towards PT goals: Not progressing toward goals - comment(high fall risk; poor attention to task/safety awareness; difficulty tolerating)    Frequency    7X/week      PT Plan Current plan remains appropriate    Co-evaluation              AM-PAC PT "6 Clicks" Mobility   Outcome Measure  Help needed turning from your back to your side while  in a flat bed without using bedrails?: A Lot Help needed moving from lying on your back to sitting on the side of a flat bed without using bedrails?: A Lot Help needed moving to and from a bed to a chair (including a wheelchair)?: A Lot Help needed standing up from a chair using your arms (e.g., wheelchair or bedside chair)?: A Lot Help needed to walk in hospital room?: A Lot Help needed climbing 3-5 steps with a railing? : Total 6 Click Score: 11    End of Session Equipment Utilized During Treatment: Gait belt Activity Tolerance: Patient limited by fatigue;Patient limited by pain Patient left: in bed;with call bell/phone within reach;with bed alarm set   PT Visit Diagnosis: Pain;Other abnormalities of gait and mobility (R26.89);Difficulty in walking, not elsewhere classified (R26.2);History of falling (Z91.81) Pain - Right/Left: Right Pain - part of body: Knee     Time: 1443-1501 PT Time Calculation (min) (ACUTE ONLY): 18 min  Charges:  $Therapeutic Activity: 8-22 mins                        Weston Anna, PT Acute Rehabilitation Services Pager: [phone] Office: [phone]

## 2019-05-30 LAB — CBC
HCT: 31.8 % — ABNORMAL LOW (ref 36.0–46.0)
Hemoglobin: 9.9 g/dL — ABNORMAL LOW (ref 12.0–15.0)
MCH: 27.7 pg (ref 26.0–34.0)
MCHC: 31.1 g/dL (ref 30.0–36.0)
MCV: 88.8 fL (ref 80.0–100.0)
Platelets: 258 10*3/uL (ref 150–400)
RBC: 3.58 MIL/uL — ABNORMAL LOW (ref 3.87–5.11)
RDW: 15.8 % — ABNORMAL HIGH (ref 11.5–15.5)
WBC: 9.8 10*3/uL (ref 4.0–10.5)
nRBC: 0 % (ref 0.0–0.2)

## 2019-05-30 NOTE — Progress Notes (Signed)
Physical Therapy Treatment Patient Details Name: [name] MRN: [id number] DOB: [birth year] Today's Date: 05/30/2019    History of Present Illness 78 yo s/p R TKA 05/27/19. PMH: VATS 2017, HTN, COPD, back pain-chronic pain, R ankle surgery, L hip surgery    PT Comments    Pt requires multimodal cues for technique and maintaining attention to task.  Pt high fall risk.  Pt only able to ambulate 3 feet reportedly due to increased pain.  Recommend d/c to SNF due to decreased mobility, increased pain, high fall risk.    Follow Up Recommendations  Follow surgeon's recommendation for DC plan and follow-up therapies;Supervision/Assistance - 24 hour     Equipment Recommendations  None recommended by PT    Recommendations for Other Services       Precautions / Restrictions Precautions Precautions: Fall;Knee Restrictions Other Position/Activity Restrictions: WBAT    Mobility  Bed Mobility Overal bed mobility: Needs Assistance Bed Mobility: Supine to Sit     Supine to sit: Mod assist     General bed mobility comments: Assist for trunk and bil LEs. Increased time. Multimodal cueing required for attention to task/completion of task.  Transfers Overall transfer level: Needs assistance Equipment used: Rolling walker (2 wheeled) Transfers: Sit to/from Stand Sit to Stand: Mod assist;+2 physical assistance;+2 safety/equipment         General transfer comment: Assist to rise, stabilize, control descent. verbal cues for attention to task, safety, technique, hand placement, posture.  pt kept hands on RW.  Unsteady and unsafe.  Ambulation/Gait Ambulation/Gait assistance: Min assist;+2 safety/equipment Gait Distance (Feet): 3 Feet Assistive device: Rolling walker (2 wheeled) Gait Pattern/deviations: Step-to pattern;Trunk flexed;Antalgic     General Gait Details: Assist to stabilize pt and manage RW. Limited by pain even with provided encouragement to continue as able. multimodal  cues for safety and for pt not to release grip on walker. Very unsafe. High fall risk.   Stairs             Wheelchair Mobility    Modified Rankin (Stroke Patients Only)       Balance Overall balance assessment: Needs assistance;History of Falls         Standing balance support: Bilateral upper extremity supported Standing balance-Leahy Scale: Poor                              Cognition Arousal/Alertness: Awake/alert Behavior During Therapy: WFL for tasks assessed/performed Overall Cognitive Status: No family/caregiver present to determine baseline cognitive functioning                         Following Commands: Follows one step commands inconsistently Safety/Judgement: Decreased awareness of safety;Decreased awareness of deficits   Problem Solving: Slow processing;Difficulty sequencing;Requires verbal cues;Requires tactile cues        Exercises      General Comments        Pertinent Vitals/Pain Pain Assessment: Faces Faces Pain Scale: Hurts even more Pain Location: R knee Pain Descriptors / Indicators: Burning;Aching;Sore;Discomfort Pain Intervention(s): Repositioned;Premedicated before session;Ice applied    Home Living                      Prior Function            PT Goals (current goals can now be found in the care plan section) Acute Rehab PT Goals PT Goal Formulation: With patient Time For Goal Achievement:  06/06/19 Potential to Achieve Goals: Fair Progress towards PT goals: Not progressing toward goals - comment(pain limiting per pt)    Frequency    7X/week      PT Plan Current plan remains appropriate    Co-evaluation              AM-PAC PT "6 Clicks" Mobility   Outcome Measure  Help needed turning from your back to your side while in a flat bed without using bedrails?: A Lot Help needed moving from lying on your back to sitting on the side of a flat bed without using bedrails?: A  Lot Help needed moving to and from a bed to a chair (including a wheelchair)?: A Lot Help needed standing up from a chair using your arms (e.g., wheelchair or bedside chair)?: A Lot Help needed to walk in hospital room?: A Lot Help needed climbing 3-5 steps with a railing? : Total 6 Click Score: 11    End of Session Equipment Utilized During Treatment: Gait belt Activity Tolerance: Patient limited by fatigue;Patient limited by pain Patient left: in chair;with chair alarm set;with call bell/phone within reach   PT Visit Diagnosis: Other abnormalities of gait and mobility (R26.89);Difficulty in walking, not elsewhere classified (R26.2);History of falling (Z91.81)     Time: 2111-7356 PT Time Calculation (min) (ACUTE ONLY): 15 min  Charges:  $Gait Training: 8-22 mins                    [name], PT, DPT Acute Rehabilitation Services Office: [phone] Pager: [phone]  Trena Platt 05/30/2019, 1:23 PM

## 2019-05-30 NOTE — Progress Notes (Signed)
Physical Therapy Treatment Patient Details Name: [name] MRN: [id number] DOB: 1941-03-06 Today's Date: 05/30/2019    History of Present Illness 78 yo s/p R TKA 05/27/19. PMH: VATS 2017, HTN, COPD, back pain-chronic pain, R ankle surgery, L hip surgery    PT Comments    Pt with decreased ability to follow simple multimodal cues for assist back to bed.  Pt presents as high fall risk and unable to ambulate this afternoon.  Pt is not safe to return home.   RN notified of pt's limited progression and inability to return home safely.  Continue to recommend SNF.     Follow Up Recommendations  Follow surgeon's recommendation for DC plan and follow-up therapies;Supervision/Assistance - 24 hour     Equipment Recommendations  None recommended by PT    Recommendations for Other Services       Precautions / Restrictions Precautions Precautions: Fall;Knee Restrictions Other Position/Activity Restrictions: WBAT    Mobility  Bed Mobility Overal bed mobility: Needs Assistance Bed Mobility: Sit to Supine     Supine to sit: Mod assist Sit to supine: Max assist;+2 for safety/equipment   General bed mobility comments: Assist for trunk and bil LEs  Transfers Overall transfer level: Needs assistance Equipment used: Rolling walker (2 wheeled) Transfers: Sit to/from Stand Sit to Stand: Mod assist;+2 physical assistance;+2 safety/equipment Stand pivot transfers: Mod assist;+2 physical assistance;+2 safety/equipment       General transfer comment: Assist to rise, stabilize, control descent. verbal cues for attention to task, safety, technique, hand placement, posture.  pt unable to keep RW close and pivot or simply take steps, kept pushing RW away from body; pt provided with multimodal cues and increased time however unable to complete transfer and requested to sit immediately so recliner brought behind pt.  recliner placed right next to bed and pt assisted with 2 person stand pivot (no  walker) for return to bed  Ambulation/Gait Ambulation/Gait assistance: Min assist;+2 safety/equipment Gait Distance (Feet): 3 Feet Assistive device: Rolling walker (2 wheeled) Gait Pattern/deviations: Step-to pattern;Trunk flexed;Antalgic     General Gait Details: pt unable   Stairs             Wheelchair Mobility    Modified Rankin (Stroke Patients Only)       Balance Overall balance assessment: Needs assistance;History of Falls         Standing balance support: Bilateral upper extremity supported Standing balance-Leahy Scale: Poor                              Cognition Arousal/Alertness: Awake/alert Behavior During Therapy: WFL for tasks assessed/performed Overall Cognitive Status: No family/caregiver present to determine baseline cognitive functioning                         Following Commands: Follows one step commands inconsistently Safety/Judgement: Decreased awareness of safety;Decreased awareness of deficits   Problem Solving: Slow processing;Difficulty sequencing;Requires verbal cues;Requires tactile cues        Exercises      General Comments        Pertinent Vitals/Pain Pain Assessment: Faces Faces Pain Scale: Hurts whole lot Pain Location: R knee Pain Descriptors / Indicators: Burning;Aching;Sore;Discomfort Pain Intervention(s): Repositioned;Monitored during session    Home Living                      Prior Function  PT Goals (current goals can now be found in the care plan section) Acute Rehab PT Goals PT Goal Formulation: With patient Time For Goal Achievement: 06/06/19 Potential to Achieve Goals: Fair Progress towards PT goals: Not progressing toward goals - comment(pain and decreased cognition)    Frequency    7X/week      PT Plan Current plan remains appropriate    Co-evaluation              AM-PAC PT "6 Clicks" Mobility   Outcome Measure  Help needed turning  from your back to your side while in a flat bed without using bedrails?: A Lot Help needed moving from lying on your back to sitting on the side of a flat bed without using bedrails?: A Lot Help needed moving to and from a bed to a chair (including a wheelchair)?: A Lot Help needed standing up from a chair using your arms (e.g., wheelchair or bedside chair)?: A Lot Help needed to walk in hospital room?: A Lot Help needed climbing 3-5 steps with a railing? : Total 6 Click Score: 11    End of Session Equipment Utilized During Treatment: Gait belt Activity Tolerance: Patient limited by fatigue;Patient limited by pain Patient left: with call bell/phone within reach;in bed;with bed alarm set;with nursing/sitter in room Nurse Communication: Mobility status PT Visit Diagnosis: Difficulty in walking, not elsewhere classified (R26.2);History of falling (Z91.81)     Time: 9485-4627 PT Time Calculation (min) (ACUTE ONLY): 23 min  Charges:   $Therapeutic Activity: 23-37 mins                     [name], PT, DPT Acute Rehabilitation Services Office: [phone] Pager: [phone] Trena Platt 05/30/2019, 2:25 PM

## 2019-05-31 NOTE — NC FL2 (Signed)
[place] LEVEL OF CARE SCREENING TOOL     IDENTIFICATION  Patient Name: [name] Birthdate: Sep 09, 1940 Sex: female Admission Date (Current Location): 05/27/2019  [facility] and [place] Number:  Herbalist and Address:         Provider Number: [id number]  Attending Physician Name and Address:  Vickey Huger, MD  Relative Name and Phone Number:       Current Level of Care: Hospital Recommended Level of Care: [place] Prior Approval Number:    Date Approved/Denied:   PASRR Number: [id number]  Discharge Plan: SNF    Current Diagnoses: Patient Active Problem List   Diagnosis Date Noted  . S/P total knee replacement 05/27/2019  . 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 Bilateral renal cysts 05/21/2018  . Hiatal hernia 05/21/2018  . 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 Thoracic radiculitis (Bilateral) (L>R) 05/03/2018  . Chronic thoracic back pain (Left) 05/03/2018  . Injury of peripheral nerves of thorax, sequela 03/22/2018  . Left-sided chest wall pain 03/21/2018  . Rib pain (Left) 03/21/2018  . Chronic rib pain (Right) 03/20/2018  . DDD (degenerative disc disease), lumbar 03/20/2018  . Chronic fracture of pubic ramus, sequela (Right) 03/20/2018  . History of femoral neck fracture, sequela (Left) 03/20/2018    Class: History of  . Lumbar facet arthropathy (Bilateral) 03/20/2018  . Lumbar facet syndrome (Bilateral) 03/20/2018  . Osteoarthritis of facet joint of lumbar spine 03/20/2018  . Spondylosis without myelopathy or radiculopathy, lumbosacral region 03/20/2018  . Other specified dorsopathies, sacral and sacrococcygeal region 03/20/2018  . Chronic pain syndrome 02/26/2018  . Cancer related pain 02/26/2018  . 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 Chronic low back pain (Secondary Area of Pain) (Bilateral) 02/26/2018  . 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 Chronic lower extremity pain [facility] Area of Pain) (Bilateral) (R>L) 02/26/2018  . Chronic hip pain (Fourth Area of Pain) (Bilateral) (R>L) 02/26/2018  . Neurogenic pain 02/26/2018  . Pharmacologic therapy 02/26/2018  . Problems influencing health status 02/26/2018  . Long term prescription benzodiazepine use 02/26/2018  . Long term prescription opiate use 02/26/2018  . Acute systolic heart failure ([place])   . Non-ST elevation (NSTEMI) myocardial infarction ([place])   . History of uterine cancer 12/08/2017  . Generalized anxiety disorder 12/08/2017  . GERD (gastroesophageal reflux disease) 12/08/2017  . Substernal chest pain 12/08/2017  . Elevated brain natriuretic peptide (BNP) level 12/08/2017  . Elevated troponin 12/08/2017  . Embedded tick of upper back excluding scapular region 12/08/2017  . Chronic sacroiliac joint pain 06/21/2017  . Disorder of skeletal system 06/21/2017  . Other long term (current) drug therapy 06/21/2017  . Other specified health status 06/21/2017  . Chronic chest wall pain (Primary Area of Pain) 06/21/2017  . Chronic post-thoracotomy pain 03/03/2017  . 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 Adenocarcinoma of right lung, stage 1 ([place]) 07/28/2016  . S/P lobectomy of lung 05/09/2016  . DVT (deep venous thrombosis) ([place]) 04/26/2016  . Heart murmur   . Acute exacerbation of chronic obstructive pulmonary disease (COPD) ([place]) 08/29/2015  . Essential hypertension 08/29/2015  . Hypercholesterolemia 08/29/2015  . CKD (chronic kidney disease), stage III ([place]) 08/29/2015  . COPD (chronic obstructive pulmonary disease) ([place]) 04/27/2015  . Acute on chronic respiratory failure with hypoxia ([place]) 04/27/2015  . Infected sebaceous cyst 08/16/2011    Orientation RESPIRATION BLADDER Height & Weight     Self, Time,  Situation, Place  Normal Continent Weight: 70.7 kg Height:  5\' 2"  (157.5 cm)  BEHAVIORAL SYMPTOMS/MOOD  NEUROLOGICAL BOWEL NUTRITION STATUS      Continent Diet(regular)  AMBULATORY STATUS COMMUNICATION OF NEEDS Skin   Extensive Assist Verbally Normal                       Personal Care Assistance Level of Assistance  Bathing, Feeding, Dressing Bathing Assistance: Limited assistance Feeding assistance: Limited assistance Dressing Assistance: Limited assistance     Functional Limitations Info  Sight Sight Info: Adequate        SPECIAL CARE FACTORS FREQUENCY  PT (By licensed PT)     PT Frequency: 5 x weekly              Contractures Contractures Info: Not present    Additional Factors Info  Code Status Code Status Info: full             Current Medications (05/31/2019):  This is the current hospital active medication list Current Facility-Administered Medications  Medication Dose Route Frequency Provider Last Rate Last Dose  . 0.9 %  sodium chloride infusion   Intravenous Continuous Donia Ast, [place] 75 mL/hr at 05/28/19 0506    . albuterol (PROVENTIL) (2.5 MG/3ML) 0.083% nebulizer solution 2.5 mg  2.5 mg Inhalation Q4H PRN Donia Ast, PA      . alum & mag hydroxide-simeth (MAALOX/MYLANTA) 200-200-20 MG/5ML suspension 30 mL  30 mL Oral Q4H PRN Donia Ast, [place]      . amLODipine (NORVASC) tablet 5 mg  5 mg Oral Daily Donia Ast, [place]   5 mg at 05/31/19 0948  . aspirin EC tablet 325 mg  325 mg Oral BID Donia Ast, [place]   325 mg at 05/31/19 5809  . atorvastatin (LIPITOR) tablet 20 mg  20 mg Oral Daily Donia Ast, [place]   20 mg at 05/31/19 0948  . bisacodyl (DULCOLAX) EC tablet 5 mg  5 mg Oral Daily PRN Donia Ast, [place]      . diazepam (VALIUM) tablet 2.5 mg  2.5 mg Oral Q8H PRN Donia Ast, [place]   2.5 mg at 05/30/19 1808  . diphenhydrAMINE (BENADRYL) 12.5 MG/5ML elixir 12.5-25 mg  12.5-25 mg Oral Q4H PRN Donia Ast, [place]      . docusate sodium (COLACE) capsule 100 mg  100 mg Oral BID Donia Ast, [place]    100 mg at 05/31/19 9833  . ferrous sulfate tablet 325 mg  325 mg Oral TID PC Donia Ast, [place]   325 mg at 05/31/19 8250  . fluticasone furoate-vilanterol (BREO ELLIPTA) 100-25 MCG/INH 1 puff  1 puff Inhalation Daily Wendie, [place], RPH   1 puff at 05/31/19 5397  . gabapentin (NEURONTIN) capsule 400 mg  400 mg Oral TID Donia Ast, [place]   400 mg at 05/31/19 0948  . HYDROmorphone (DILAUDID) injection 0.5-1 mg  0.5-1 mg Intravenous Q4H PRN Donia Ast, [place]   1 mg at 05/29/19 0736  . lisinopril (ZESTRIL) tablet 10 mg  10 mg Oral q morning - 10a Donia Ast, [place]   10 mg at 05/31/19 0948  . menthol-cetylpyridinium (CEPACOL) lozenge 3 mg  1 lozenge Oral PRN Donia Ast, PA       Or  . phenol [facility]) mouth spray 1 spray  1 spray Mouth/Throat PRN Donia Ast, PA      . methocarbamol (ROBAXIN) tablet  500 mg  500 mg Oral Q6H PRN Donia Ast, [place]   500 mg at 05/30/19 1308   Or  . methocarbamol (ROBAXIN) 500 mg in dextrose 5 % 50 mL IVPB  500 mg Intravenous Q6H PRN Donia Ast, PA 100 mL/hr at 05/27/19 1055 500 mg at 05/27/19 1055  . metoCLOPramide (REGLAN) tablet 5 mg  5 mg Oral BID Donia Ast, [place]   5 mg at 05/31/19 2446  . metoprolol tartrate (LOPRESSOR) tablet 12.5 mg  12.5 mg Oral BID Donia Ast, [place]   12.5 mg at 05/31/19 9507  . mometasone-formoterol (DULERA) 200-5 MCG/ACT inhaler 2 puff  2 puff Inhalation BID Donia Ast, [place]   2 puff at 05/31/19 2257  . ondansetron (ZOFRAN) tablet 4 mg  4 mg Oral Q6H PRN Donia Ast, [place]       Or  . ondansetron [facility]) injection 4 mg  4 mg Intravenous Q6H PRN Donia Ast, [place]      . oxyCODONE (Oxy IR/ROXICODONE) immediate release tablet 5-10 mg  5-10 mg Oral Q4H PRN Donia Ast, [place]   10 mg at 05/29/19 5051  . pantoprazole (PROTONIX) EC tablet 40 mg  40 mg Oral Q0600 Donia Ast, [place]   40 mg at 05/31/19 8335  . raloxifene (EVISTA) tablet 60 mg  60 mg  Oral Daily Donia Ast, [place]   60 mg at 05/30/19 1046  . senna-docusate (Senokot-S) tablet 1 tablet  1 tablet Oral QHS PRN Donia Ast, PA      . sertraline (ZOLOFT) tablet 100 mg  100 mg Oral Daily Donia Ast, [place]   100 mg at 05/31/19 0948  . sodium phosphate (FLEET) 7-19 GM/118ML enema 1 enema  1 enema Rectal Once PRN Donia Ast, PA      . traMADol Veatrice Bourbon) tablet 50 mg  50 mg Oral Q6H Carlyon Shadow [place], [place]   50 mg at 05/31/19 8251  . umeclidinium bromide (INCRUSE ELLIPTA) 62.5 MCG/INH 1 puff  1 puff Inhalation Daily Tieasha, [place], RPH   1 puff at 05/31/19 8984  . zolpidem (AMBIEN) tablet 5 mg  5 mg Oral QHS PRN Donia Ast, [place]         Discharge Medications: Please see discharge summary for a list of discharge medications.  Relevant Imaging Results:  Relevant Lab Results:   Additional Information [id number]  Leeroy Cha, RN

## 2019-05-31 NOTE — Progress Notes (Signed)
Physical Therapy Treatment Patient Details Name: [name] MRN: [id number] DOB: [birth year] Today's Date: 05/31/2019    History of Present Illness 78 yo s/p R TKA 05/27/19. PMH: VATS 2017, HTN, COPD, back pain-chronic pain, R ankle surgery, L hip surgery    PT Comments    Pt attempted to mobilize however extremely limited by pain.  Pt requiring total assist to attempt standing x2.  Pt continues to require increased assist and multimodal cues.  Recommend d/c to SNF.   Follow Up Recommendations  Follow surgeon's recommendation for DC plan and follow-up therapies;Supervision/Assistance - 24 hour     Equipment Recommendations  None recommended by PT    Recommendations for Other Services       Precautions / Restrictions Precautions Precautions: Fall;Knee Restrictions Weight Bearing Restrictions: No Other Position/Activity Restrictions: WBAT    Mobility  Bed Mobility Overal bed mobility: Needs Assistance Bed Mobility: Supine to Sit;Sit to Supine     Supine to sit: Max assist Sit to supine: Max assist;+2 for physical assistance   General bed mobility comments: verbal cues for technique, pt requires increased time, assist for upper and lower body  Transfers Overall transfer level: Needs assistance Equipment used: Rolling walker (2 wheeled) Transfers: Sit to/from Stand Sit to Stand: +2 physical assistance;+2 safety/equipment;Total assist         General transfer comment: Assist to rise, stabilize, control descent. verbal cues for attention to task, safety, technique, hand placement, posture.  Pt unable to maintain upright posture due to pain, attempted x2  Ambulation/Gait                 Stairs             Wheelchair Mobility    Modified Rankin (Stroke Patients Only)       Balance                                            Cognition Arousal/Alertness: Awake/alert Behavior During Therapy: WFL for tasks  assessed/performed Overall Cognitive Status: No family/caregiver present to determine baseline cognitive functioning                         Following Commands: Follows one step commands inconsistently Safety/Judgement: Decreased awareness of safety;Decreased awareness of deficits   Problem Solving: Slow processing;Difficulty sequencing;Requires verbal cues;Requires tactile cues        Exercises      General Comments        Pertinent Vitals/Pain Pain Assessment: Faces Faces Pain Scale: Hurts whole lot Pain Location: R knee Pain Descriptors / Indicators: Burning;Aching;Sore;Discomfort Pain Intervention(s): Repositioned;Premedicated before session;Monitored during session;Ice applied    Home Living                      Prior Function            PT Goals (current goals can now be found in the care plan section) Progress towards PT goals: Not progressing toward goals - comment    Frequency           PT Plan Current plan remains appropriate    Co-evaluation              AM-PAC PT "6 Clicks" Mobility   Outcome Measure  Help needed turning from your back to your side while in a flat bed without using  bedrails?: A Lot Help needed moving from lying on your back to sitting on the side of a flat bed without using bedrails?: Total Help needed moving to and from a bed to a chair (including a wheelchair)?: Total Help needed standing up from a chair using your arms (e.g., wheelchair or bedside chair)?: Total Help needed to walk in hospital room?: Total Help needed climbing 3-5 steps with a railing? : Total 6 Click Score: 7    End of Session Equipment Utilized During Treatment: Gait belt Activity Tolerance: Patient limited by fatigue;Patient limited by pain Patient left: with call bell/phone within reach;in bed;with bed alarm set;with family/visitor present   PT Visit Diagnosis: Difficulty in walking, not elsewhere classified (R26.2);History of  falling (Z91.81)     Time: 1011-1030 PT Time Calculation (min) (ACUTE ONLY): 19 min  Charges:  $Therapeutic Activity: 8-22 mins                    [name], PT, DPT Acute Rehabilitation Services Office: [phone] Pager: [phone]  Trena Platt 05/31/2019, 11:55 AM

## 2019-05-31 NOTE — Plan of Care (Signed)
Plan of care reviewed. 

## 2019-05-31 NOTE — TOC Progression Note (Signed)
Transition of Care [facility]) - Progression Note    Patient Details  Name: ANIKKA MARSAN MRN: [id number] Date of Birth: 1940-11-04  [facility] of [facility]) CM/SW Contact  Leeroy Cha, RN Phone Number: 05/31/2019, 4:33 PM  Clinical Narrative:    Bed given to [patient has chosen [facility] at [place] will start insurance auth.   Expected Discharge Plan: Skilled Nursing Facility Barriers to Discharge: Insurance Authorization  Expected Discharge Plan and Services Expected Discharge Plan: [place]         Expected Discharge Date: 05/28/19                                     Social Determinants of Health (SDOH) Interventions    Readmission Risk Interventions No flowsheet data found.

## 2019-05-31 NOTE — TOC Progression Note (Signed)
Transition of Care [facility]) - Progression Note    Patient Details  Name: [name] MRN: [id number] Date of Birth: 12/23/1940  [facility]) CM/SW Contact  Leeroy Cha, RN Phone Number: 05/31/2019, 10:39 AM  Clinical Narrative:    PASSAR number obtained and fl2 faxed out to area snf.         Expected Discharge Plan and Services           Expected Discharge Date: 05/28/19                                     Social Determinants of Health (SDOH) Interventions    Readmission Risk Interventions No flowsheet data found.

## 2019-05-31 NOTE — Care Management Important Message (Signed)
Important Message  Patient Details IM Letter given to Velva Harman RN to present to the Patient Name: [name] MRN: [id number] Date of Birth: 1941/03/05   Medicare Important Message Given:  Yes     Kerin Salen 05/31/2019, 11:38 AM

## 2019-06-01 LAB — URINALYSIS, ROUTINE W REFLEX MICROSCOPIC
Bacteria, UA: NONE SEEN
Bilirubin Urine: NEGATIVE
Glucose, UA: NEGATIVE mg/dL
Ketones, ur: 5 mg/dL — AB
Leukocytes,Ua: NEGATIVE
Nitrite: NEGATIVE
Protein, ur: 100 mg/dL — AB
Specific Gravity, Urine: 1.023 (ref 1.005–1.030)
pH: 6 (ref 5.0–8.0)

## 2019-06-01 NOTE — Progress Notes (Signed)
Made Kathlyn [place] Robbins,PA aware of patients decline in PT participation, weakness, hypertension and that patient has not been discharged to SNF. Made PA aware of patients family request to be discharged home with family. PA ordered UA, stated on call would be come to round on patient and minimal pain medicine use.

## 2019-06-01 NOTE — Progress Notes (Signed)
Physical Therapy Treatment Patient Details Name: [name] MRN: [id number] DOB: 10/29/1940 Today's Date: 06/01/2019    History of Present Illness 78 yo s/p R TKA 05/27/19. PMH: VATS 2017, HTN, COPD, back pain-chronic pain, R ankle surgery, L hip surgery    PT Comments    Pt continues to exhibit cognitive and functional decline. She is not following 1 step commands. Poor attention to task and processing. She was Total assist +2 for bed mobility. She was barely able to get to EOB on today. Once EOB she leaned heavily to R side despite verbal and tactile cueing to encourage correction to midline. Brought RN into room to observe pt's condition. RN assisted with mobility as well. Continue to recommend SNF placement. Unsure if family can properly/safely care for pt in her current state. Will continue to follow and progress activity as able. There have been no visits/notes from PA or surgeon since 11/3.    Follow Up Recommendations  Follow surgeon's recommendation for DC plan and follow-up therapies;SNF;Supervision/Assistance - 24 hour     Equipment Recommendations  None recommended by PT    Recommendations for Other Services       Precautions / Restrictions Precautions Precautions: Fall;Knee Precaution Comments: high fall risk Restrictions Weight Bearing Restrictions: No Other Position/Activity Restrictions: WBAT    Mobility  Bed Mobility Overal bed mobility: Needs Assistance Bed Mobility: Supine to Sit;Sit to Supine     Supine to sit: Total assist;+2 for physical assistance;+2 for safety/equipment;HOB elevated Sit to supine: +2 for physical assistance;+2 for safety/equipment;HOB elevated;Total assist   General bed mobility comments: Max multimodal cueing for technique. Pt does not initiate movement even with cueing. Poor processing. Assist for trunk and bil LEs. Utilized bedpad for scooting, positioning. Sat EOB for ~5 mintues.  Transfers                 General  transfer comment: NT-unable to safely attempt on today.  Ambulation/Gait                 Stairs             Wheelchair Mobility    Modified Rankin (Stroke Patients Only)       Balance Overall balance assessment: History of Falls;Needs assistance Sitting-balance support: Bilateral upper extremity supported;Single extremity supported;Feet supported Sitting balance-Leahy Scale: Poor Sitting balance - Comments: Heavy R lean initially. With time, at least 5 minutes, pt was able to sit with UE support with Min assist. Pt could/would not maintain trunk at midline at any point during session.                                    Cognition Arousal/Alertness: Awake/alert Behavior During Therapy: WFL for tasks assessed/performed Overall Cognitive Status: No family/caregiver present to determine baseline cognitive functioning Area of Impairment: Attention;Memory;Following commands;Safety/judgement;Awareness;Problem solving                   Current Attention Level: Focused Memory: Decreased recall of precautions;Decreased short-term memory Following Commands: Follows one step commands inconsistently Safety/Judgement: Decreased awareness of safety;Decreased awareness of deficits   Problem Solving: Slow processing;Decreased initiation;Difficulty sequencing;Requires verbal cues;Requires tactile cues General Comments: cognition continues to decline      Exercises Total Joint Exercises Ankle Circles/Pumps: AROM;Both;5 reps;Supine Heel Slides: AAROM;5 reps;Supine;Right Hip ABduction/ADduction: AAROM;Right;5 reps;Supine Goniometric ROM: ~10-55 degrees    General Comments        Pertinent Vitals/Pain  Pain Assessment: Faces Faces Pain Scale: Hurts whole lot Pain Location: back, R knee Pain Descriptors / Indicators: Grimacing;Guarding;Discomfort;Aching;Sore;Moaning(yelling out) Pain Intervention(s): Repositioned;Monitored during session;Limited activity  within patient's tolerance    Home Living                      Prior Function            PT Goals (current goals can now be found in the care plan section) Progress towards PT goals: Not progressing toward goals - comment    Frequency    7X/week      PT Plan Current plan remains appropriate    Co-evaluation              AM-PAC PT "6 Clicks" Mobility   Outcome Measure  Help needed turning from your back to your side while in a flat bed without using bedrails?: Total Help needed moving from lying on your back to sitting on the side of a flat bed without using bedrails?: Total Help needed moving to and from a bed to a chair (including a wheelchair)?: Total Help needed standing up from a chair using your arms (e.g., wheelchair or bedside chair)?: Total Help needed to walk in hospital room?: Total Help needed climbing 3-5 steps with a railing? : Total 6 Click Score: 6    End of Session   Activity Tolerance: Patient limited by pain;Patient limited by fatigue(limited by cognition) Patient left: in bed;with call bell/phone within reach;with bed alarm set;with nursing/sitter in room   PT Visit Diagnosis: Other abnormalities of gait and mobility (R26.89);Difficulty in walking, not elsewhere classified (R26.2);Pain;Repeated falls (R29.6);History of falling (Z91.81);Muscle weakness (generalized) (M62.81) Pain - Right/Left: Right Pain - part of body: Knee(back)     Time: 1410-1447 PT Time Calculation (min) (ACUTE ONLY): 37 min  Charges:  $Therapeutic Activity: 23-37 mins                     Weston Anna, PT Acute Rehabilitation Services Pager: [phone] Office: [phone]

## 2019-06-01 NOTE — Progress Notes (Signed)
Spoke with daughter Olevia Bowens, who stated the family is requesting for patient to come home instead of to SNF. Tye [place], daughter, Horris Latino sister and son would be staying with patient to assure safety. Patient to receive PT session after lunch and discuss with PT. Will continue to monitor.

## 2019-06-02 ENCOUNTER — Inpatient Hospital Stay (HOSPITAL_COMMUNITY): Payer: Medicare Other

## 2019-06-02 ENCOUNTER — Encounter (HOSPITAL_COMMUNITY): Payer: Self-pay

## 2019-06-02 DIAGNOSIS — R109 Unspecified abdominal pain: Secondary | ICD-10-CM

## 2019-06-02 DIAGNOSIS — R52 Pain, unspecified: Secondary | ICD-10-CM

## 2019-06-02 DIAGNOSIS — G9341 Metabolic encephalopathy: Secondary | ICD-10-CM

## 2019-06-02 DIAGNOSIS — R609 Edema, unspecified: Secondary | ICD-10-CM

## 2019-06-02 HISTORY — DX: Metabolic encephalopathy: G93.41

## 2019-06-02 LAB — BASIC METABOLIC PANEL
Anion gap: 12 (ref 5–15)
BUN: 22 mg/dL (ref 8–23)
CO2: 23 mmol/L (ref 22–32)
Calcium: 8.2 mg/dL — ABNORMAL LOW (ref 8.9–10.3)
Chloride: 99 mmol/L (ref 98–111)
Creatinine, Ser: 0.78 mg/dL (ref 0.44–1.00)
GFR calc Af Amer: >60 mL/min (ref >60)
GFR calc non Af Amer: >60 mL/min (ref >60)
Glucose, Bld: 95 mg/dL (ref 70–99)
Potassium: 3.4 mmol/L — ABNORMAL LOW (ref 3.5–5.1)
Sodium: 134 mmol/L — ABNORMAL LOW (ref 135–145)

## 2019-06-02 LAB — CBC
HCT: 31.7 % — ABNORMAL LOW (ref 36.0–46.0)
Hemoglobin: 10.2 g/dL — ABNORMAL LOW (ref 12.0–15.0)
MCH: 28.4 pg (ref 26.0–34.0)
MCHC: 32.2 g/dL (ref 30.0–36.0)
MCV: 88.3 fL (ref 80.0–100.0)
Platelets: 320 10*3/uL (ref 150–400)
RBC: 3.59 MIL/uL — ABNORMAL LOW (ref 3.87–5.11)
RDW: 15.6 % — ABNORMAL HIGH (ref 11.5–15.5)
WBC: 10.7 10*3/uL — ABNORMAL HIGH (ref 4.0–10.5)
nRBC: 0 % (ref 0.0–0.2)

## 2019-06-02 LAB — VITAMIN D 25 HYDROXY (VIT D DEFICIENCY, FRACTURES): Vit D, 25-Hydroxy: 9.62 ng/mL — ABNORMAL LOW (ref 30–100)

## 2019-06-02 LAB — TSH: TSH: 1.422 u[IU]/mL (ref 0.350–4.500)

## 2019-06-02 LAB — HEPATIC FUNCTION PANEL
ALT: 12 U/L (ref 0–44)
AST: 17 U/L (ref 15–41)
Albumin: 2.5 g/dL — ABNORMAL LOW (ref 3.5–5.0)
Alkaline Phosphatase: 79 U/L (ref 38–126)
Bilirubin, Direct: 0.8 mg/dL — ABNORMAL HIGH (ref 0.0–0.2)
Indirect Bilirubin: 0.8 mg/dL (ref 0.3–0.9)
Total Bilirubin: 1.6 mg/dL — ABNORMAL HIGH (ref 0.3–1.2)
Total Protein: 6.7 g/dL (ref 6.5–8.1)

## 2019-06-02 LAB — SEDIMENTATION RATE: Sed Rate: 140 mm/hr — ABNORMAL HIGH (ref 0–22)

## 2019-06-02 LAB — C-REACTIVE PROTEIN: CRP: 20.8 mg/dL — ABNORMAL HIGH (ref <1.0)

## 2019-06-02 LAB — MRSA PCR SCREENING: MRSA by PCR: NEGATIVE

## 2019-06-02 LAB — TROPONIN I (HIGH SENSITIVITY)
Troponin I (High Sensitivity): 6 ng/L (ref <18)
Troponin I (High Sensitivity): 7 ng/L (ref <18)

## 2019-06-02 LAB — D-DIMER, QUANTITATIVE: D-Dimer, Quant: 6.09 ug/mL-FEU — ABNORMAL HIGH (ref 0.00–0.50)

## 2019-06-02 LAB — AMMONIA: Ammonia: 18 umol/L (ref 9–35)

## 2019-06-02 IMAGING — DX DG ABDOMEN 1V
1 series · 1 of 1 positions shown · non-contrast
Comparison: [DATE]

CLINICAL DATA: Abdominal pain/distension.

EXAM:
ABDOMEN - 1 VIEW

[abdomen kub]
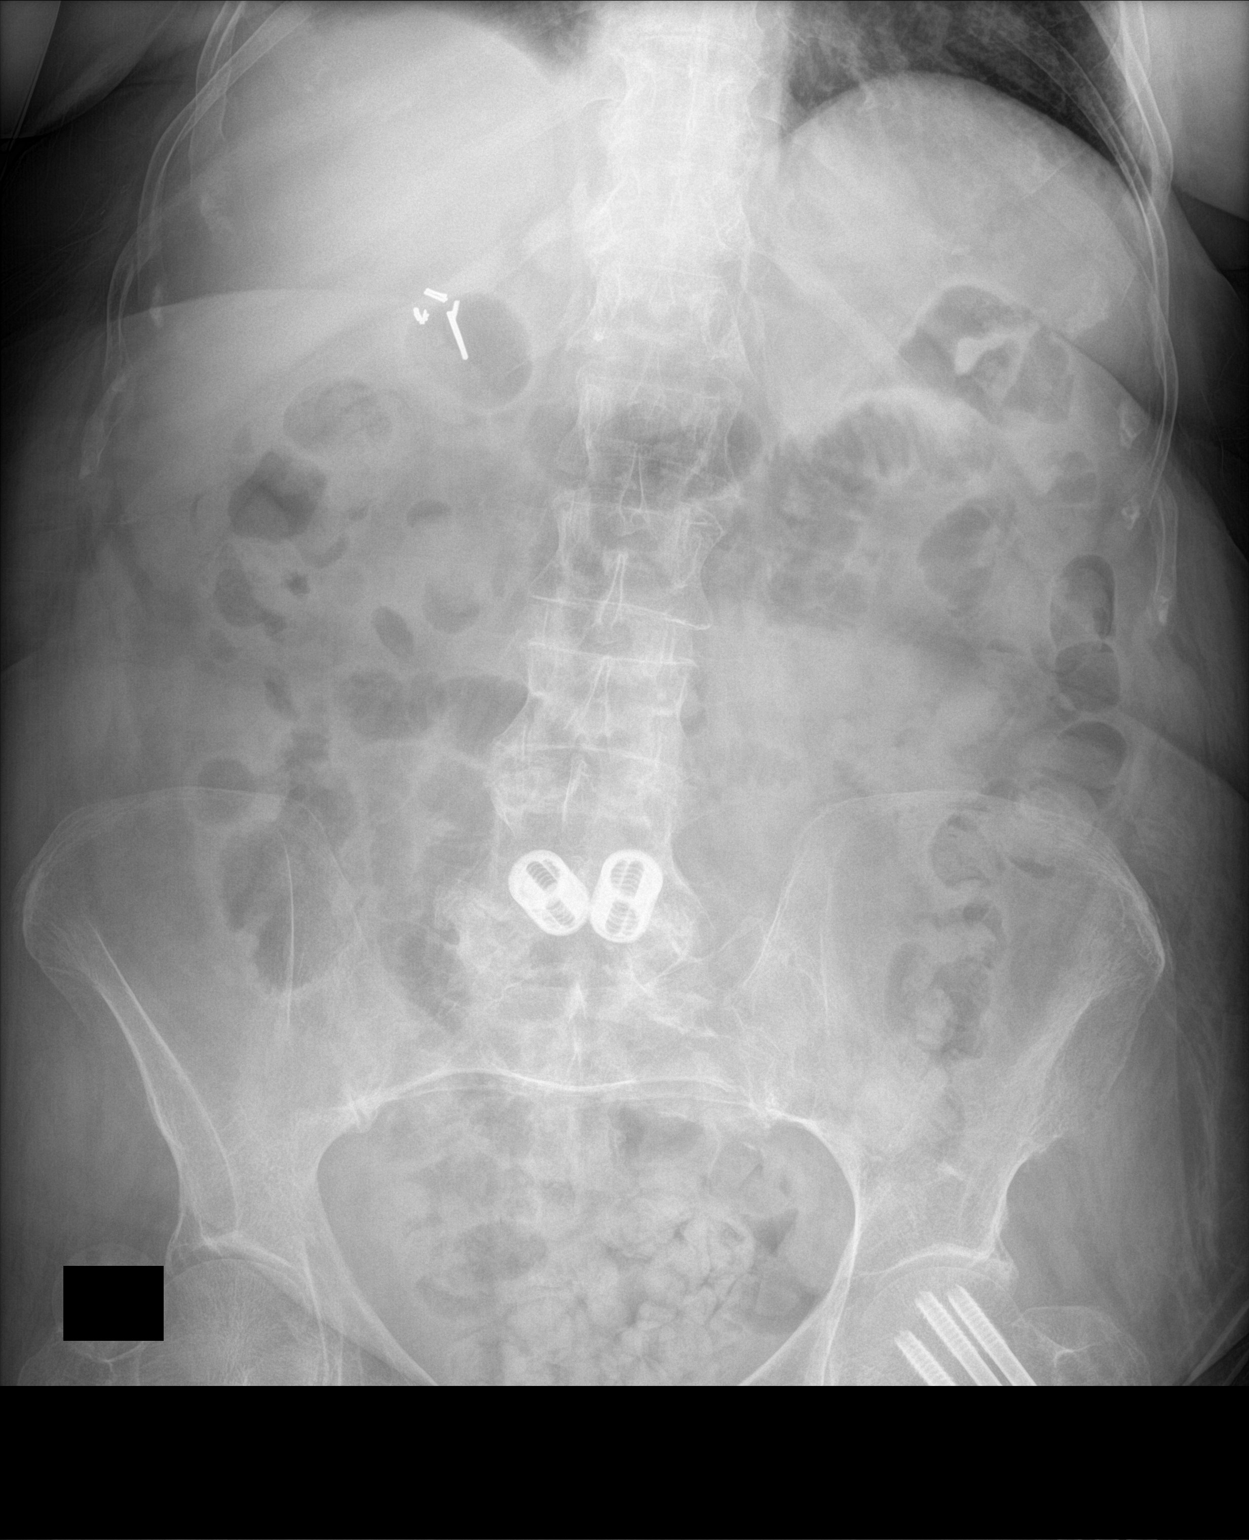

[1 of 1 positions shown; findings below may reference images not displayed]

FINDINGS: Air and stool present throughout the colon with moderate fecal
retention over the rectum. There are a few air-filled nondilated
small bowel loops present. No free peritoneal air. Previous
cholecystectomy. Mild degenerate change of the spine and hips.
Partially visualized hardware over the left femoral neck intact. Old
right pelvic fracture. Intervertebral cage at the L5-S1 level
unchanged.
IMPRESSION: Nonspecific, nonobstructive bowel gas pattern with moderate fecal
retention over the rectum.

## 2019-06-02 IMAGING — CT CT HEAD W/O CM
3 of 6 series · 13 of 47 positions shown, 15 images · non-contrast
Comparison: Report from head CT [DATE] (images unavailable),
head CT [DATE] report from brain MRI [DATE]

CLINICAL DATA: Altered level of consciousness (LOC), unexplained.
Additional history provided: Status post right knee replacement.
Additional history: History of adenocarcinoma status post right
upper lobectomy.

EXAM:
CT HEAD WITHOUT CONTRAST
TECHNIQUE: Contiguous axial images were obtained from the base of the skull
through the vertex without intravenous contrast.

[Series 2: head wo · axial · 0.46mm/px · z∈[-81,+29]mm · 8 of 30 slices shown, 10 images]
[im 4/30  brain]
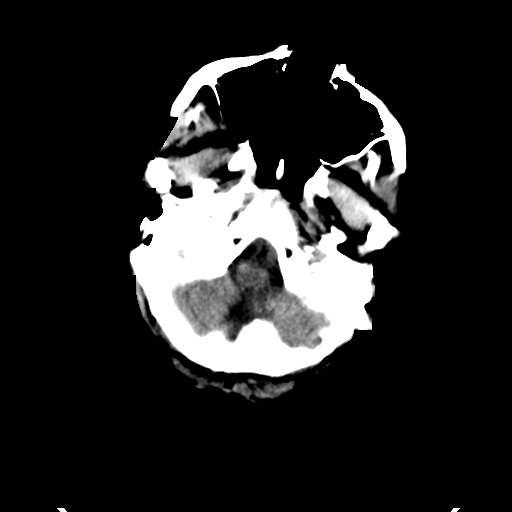
[im 4/30  bone]
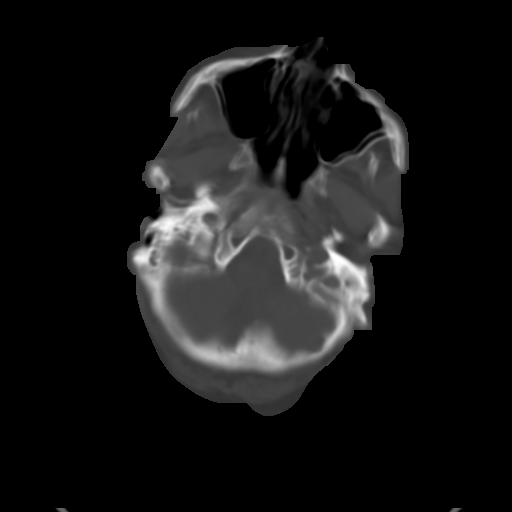
[im 7/30  brain]
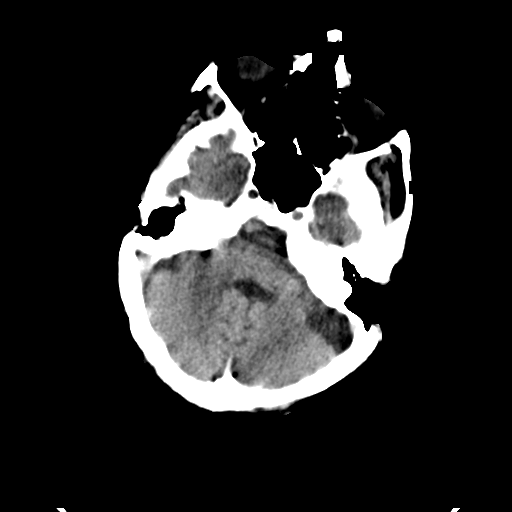
[im 10/30  brain]
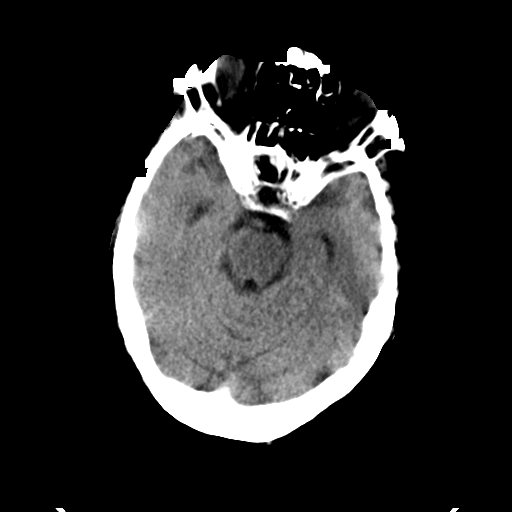
[im 13/30  brain]
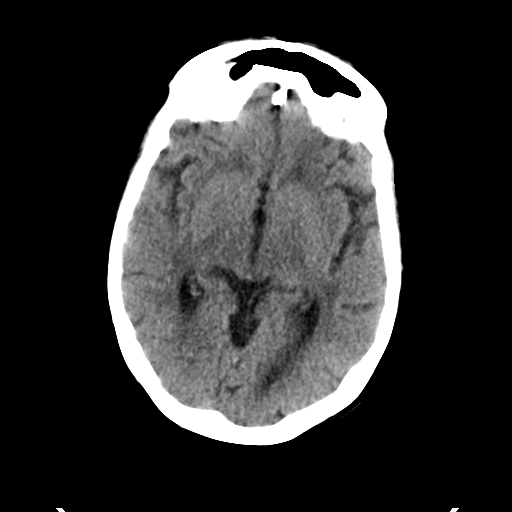
[im 17/30  brain]
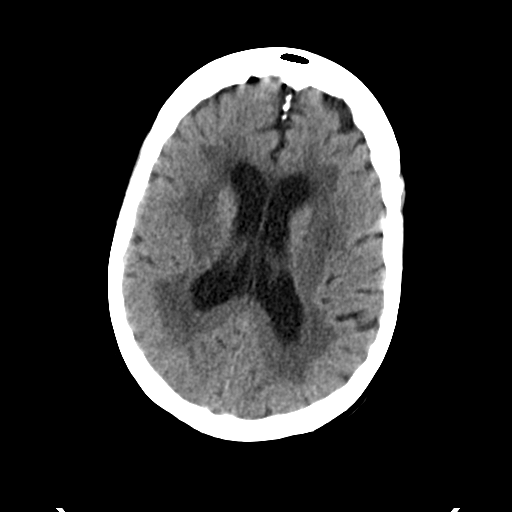
[im 17/30  bone]
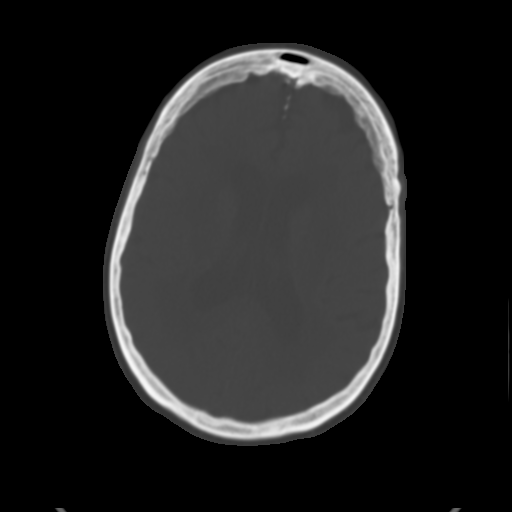
[im 20/30  brain]
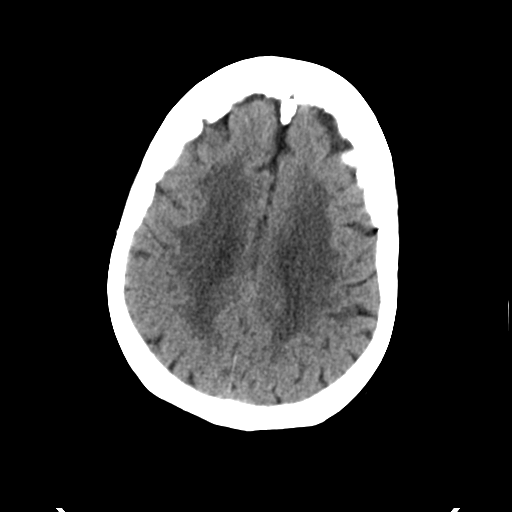
[im 23/30  brain]
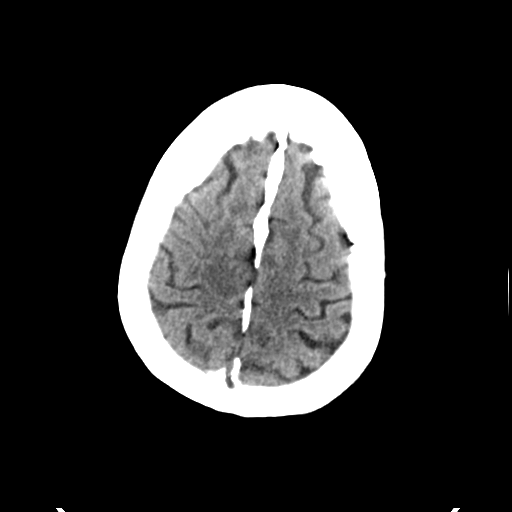
[im 26/30  brain]
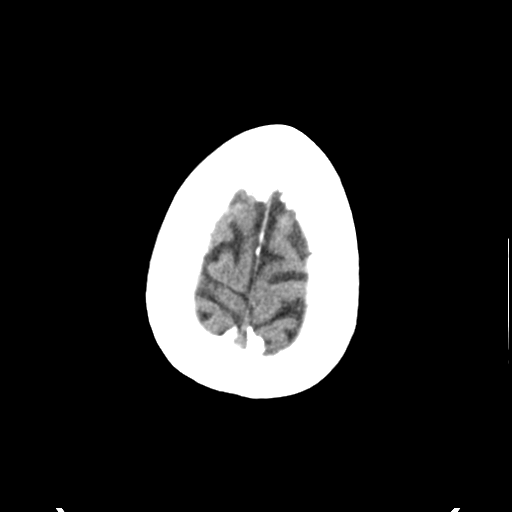

[Series 8: coronal soft tissue · coronal · 0.19mm/px · 3 of 67 slices shown]
[im 17/67  brain]
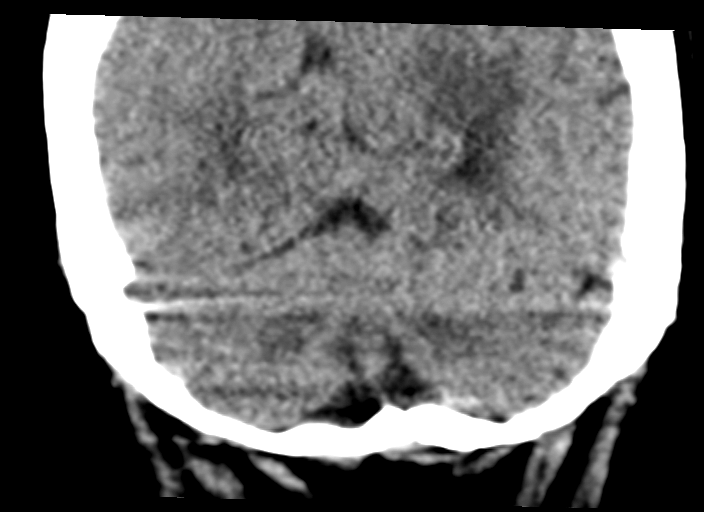
[im 34/67  brain]
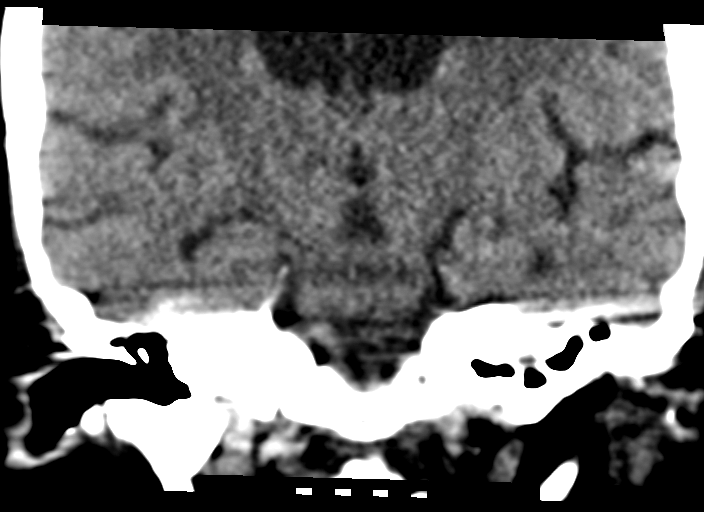
[im 50/67  brain]
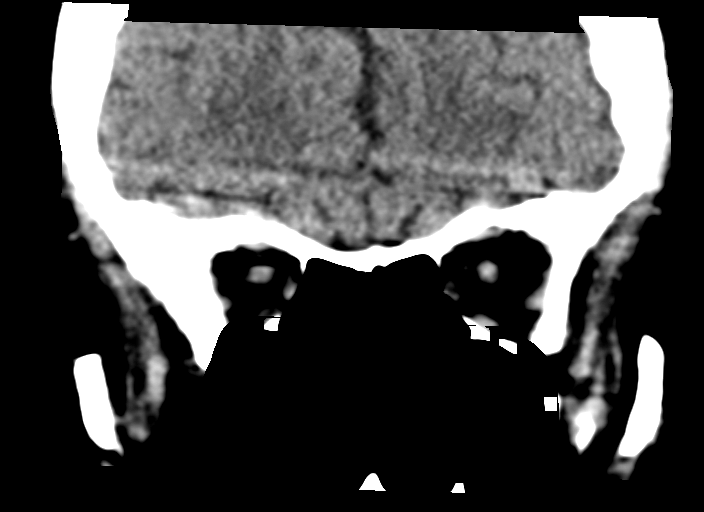

[Series 9: sagittal soft tissue · sagittal · 0.18mm/px · 2 of 49 slices shown]
[im 17/49  brain]
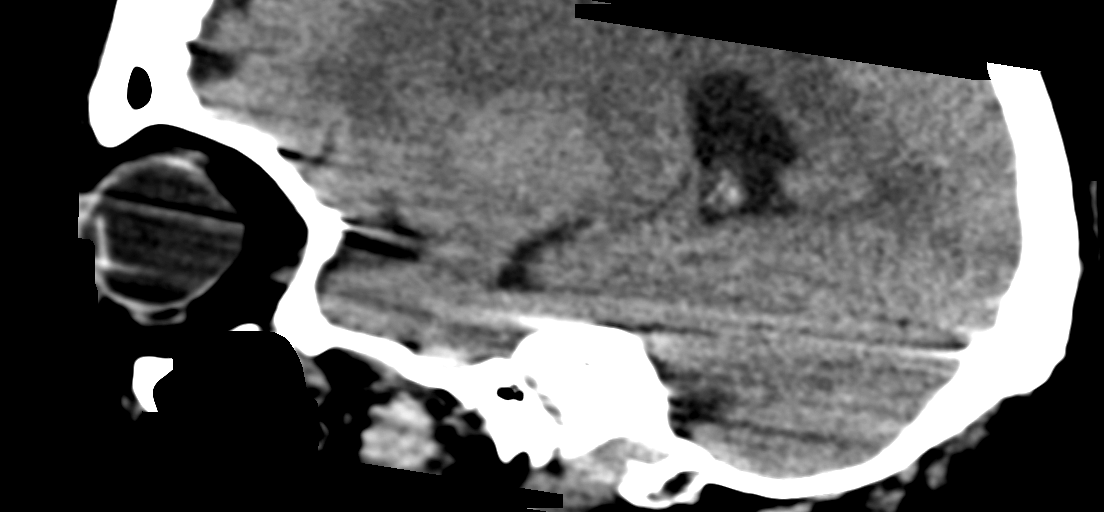
[im 33/49  brain]
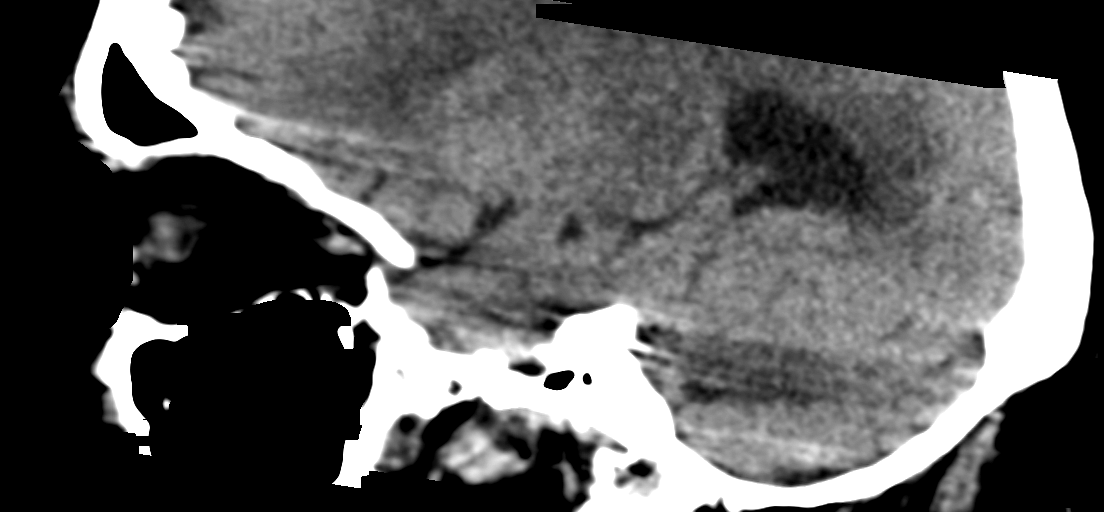

[13 of 47 positions shown; findings below may reference images not displayed]

FINDINGS: Brain:

The examination is motion degraded, limiting evaluation.

There is a punctate focus of hyperdensity within the right
periatrial white matter which likely reflects a tiny calcification.
No convincing evidence of acute intracranial hemorrhage.

No demarcated cortical infarction.

No evidence of intracranial mass.

No midline shift or extra-axial fluid collection.

Extensive nearly confluent hypodensity within the bilateral cerebral
white matter has progressed from prior head CT [DATE], as has
heterogeneity of the bilateral basal ganglia and thalami. This
finding is nonspecific but may reflect advanced chronic small vessel
ischemic disease.

Cerebral volume is normal for age.  Partially empty sella turcica.

Vascular: No hyperdense vessel.  Atherosclerotic calcifications.

Skull: Normal. Negative for fracture or focal lesion.

Sinuses/Orbits: Visualized orbits demonstrate no acute abnormality.
Bilateral posterior staphyloma globe deformities. No significant
paranasal sinus disease or mastoid effusion at the imaged levels.
IMPRESSION: 1. Significantly motion degraded examination.
2. No evidence of acute intracranial abnormality.
3. Advanced cerebral white matter disease and heterogeneity of the
bilateral basal ganglia and thalami has progressed from prior head
CT [DATE]. Findings are nonspecific but likely reflect advanced
chronic small vessel ischemic disease. Consider brain MRI to exclude
superimposed acute infarct, if clinically warranted.

## 2019-06-02 IMAGING — CT CT ANGIO CHEST
2 of 6 series · 18 of 46 positions shown · IV contrast (omnipaque)
Comparison: Chest CT [DATE]

CLINICAL DATA: Patient with recent right knee replacement.
Confusion. Evaluate for pulmonary embolus.

EXAM:
CT ANGIOGRAPHY CHEST WITH CONTRAST
TECHNIQUE: Multidetector CT imaging of the chest was performed using the
standard protocol during bolus administration of intravenous
contrast. Multiplanar CT image reconstructions and MIPs were
obtained to evaluate the vascular anatomy.
CONTRAST:  80mL OMNIPAQUE IOHEXOL 350 MG/ML SOLN

[Series 5: thins · axial · 0.77mm/px · z∈[-279,-20]mm · 16 of 285 slices shown]
[im 13/285  lung]
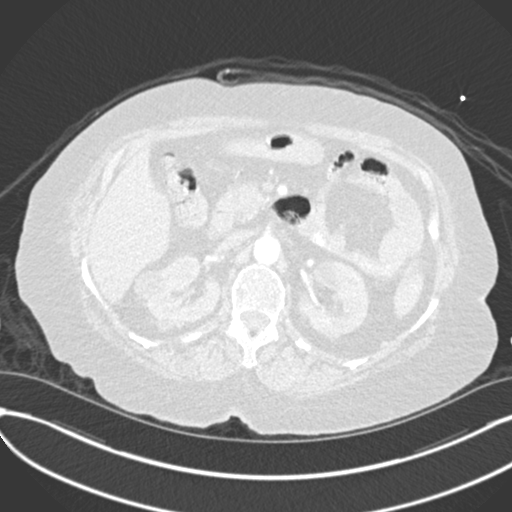
[im 38/285  soft-tissue]
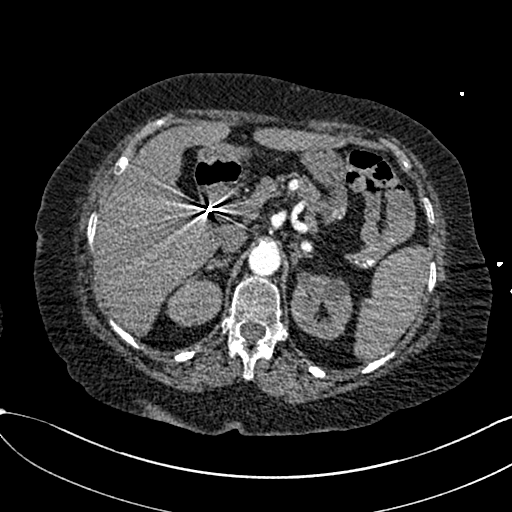
[im 50/285  lung]
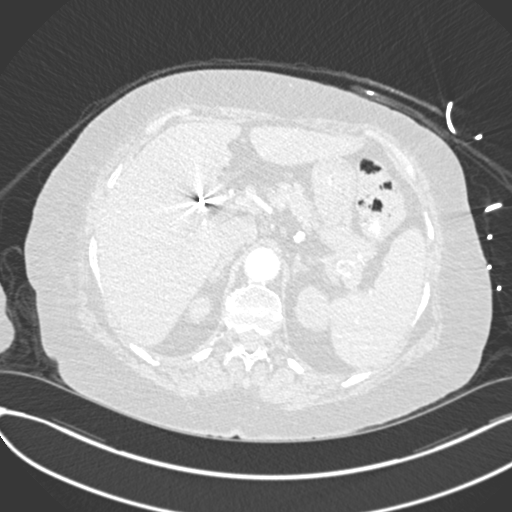
[im 62/285  soft-tissue]
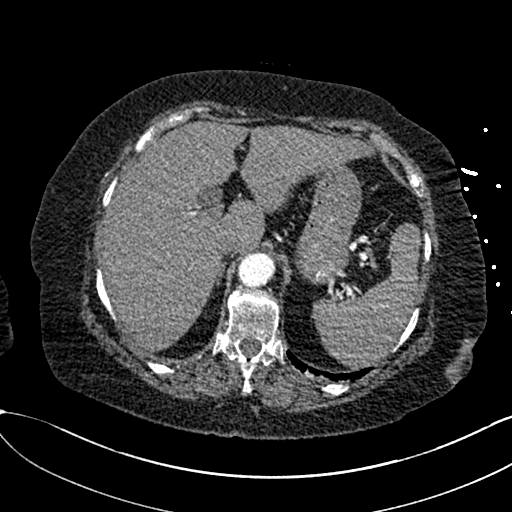
[im 87/285  lung]
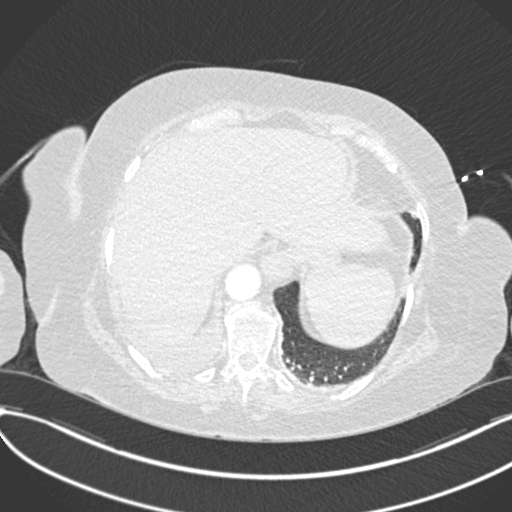
[im 99/285  soft-tissue]
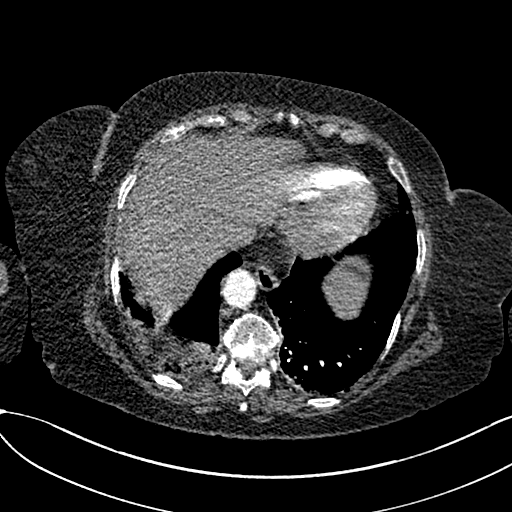
[im 112/285  lung]
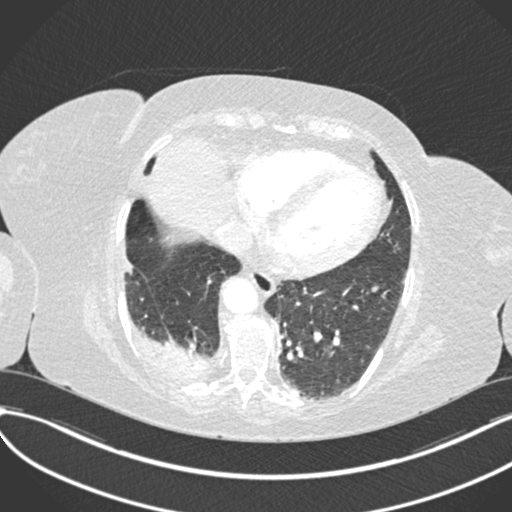
[im 136/285  soft-tissue]
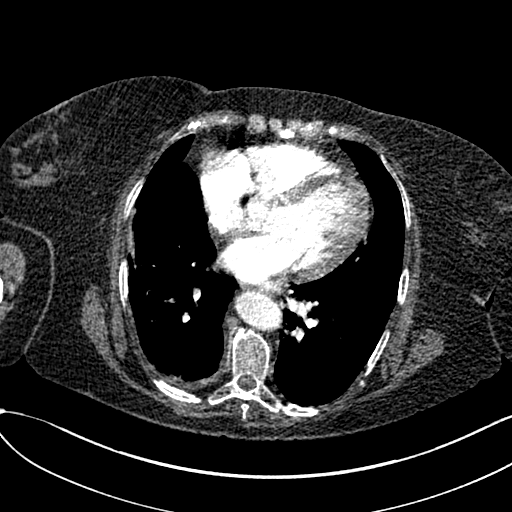
[im 149/285  lung]
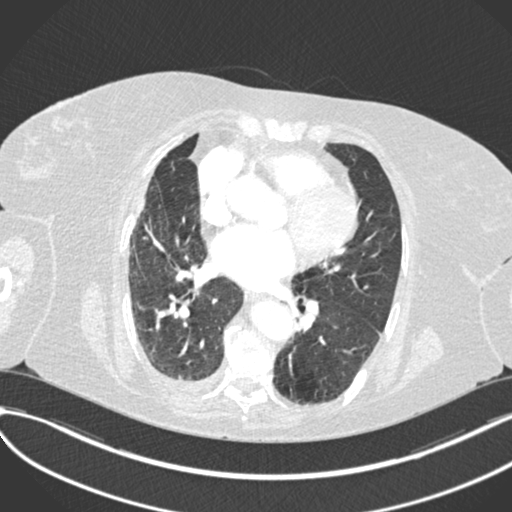
[im 173/285  soft-tissue]
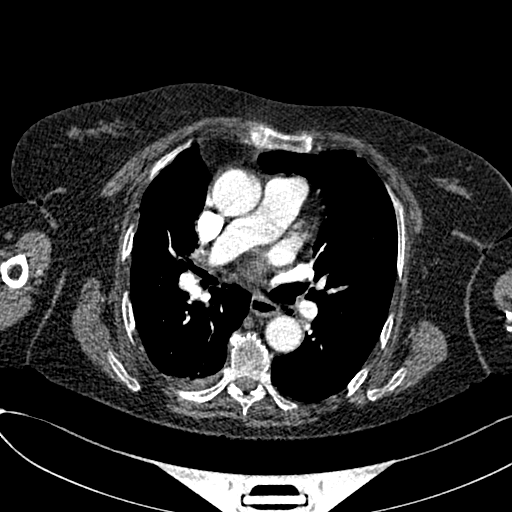
[im 186/285  lung]
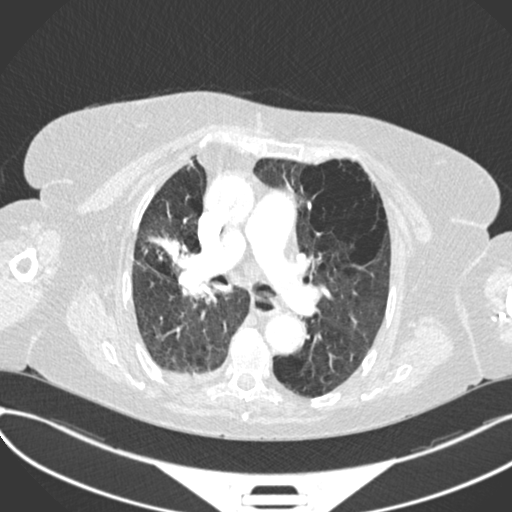
[im 198/285  soft-tissue]
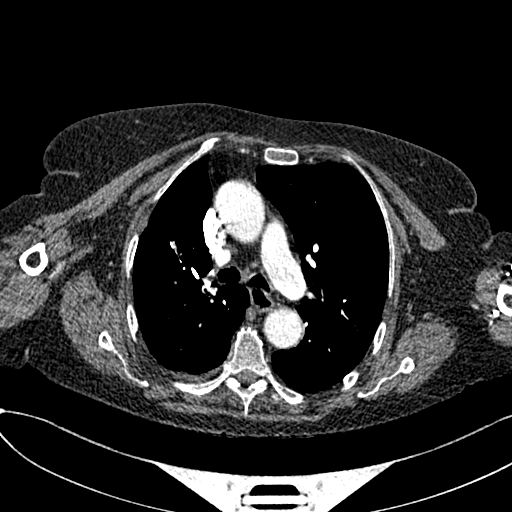
[im 223/285  lung]
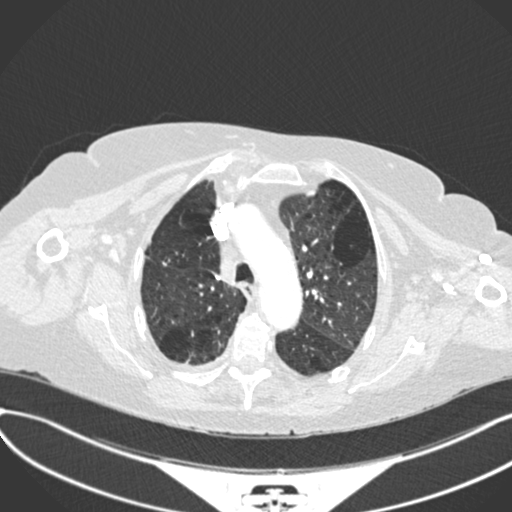
[im 235/285  soft-tissue]
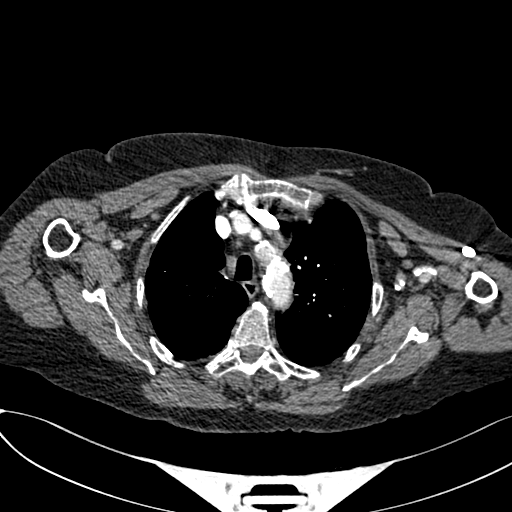
[im 247/285  lung]
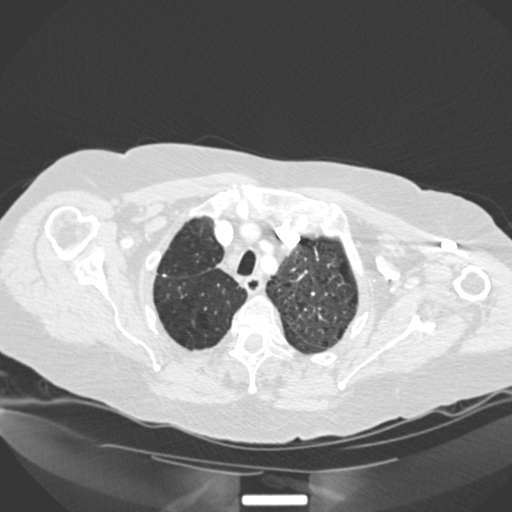
[im 272/285  soft-tissue]
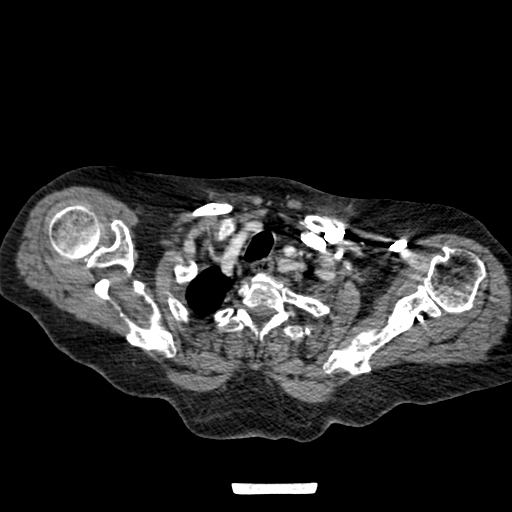

[Series 6: coronal mpr · coronal · 0.56mm/px · 2 of 85 slices shown]
[im 29/85  soft-tissue]
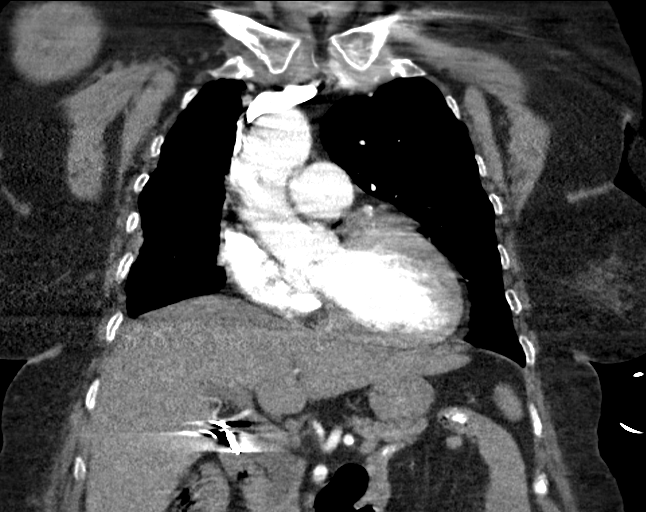
[im 57/85  soft-tissue]
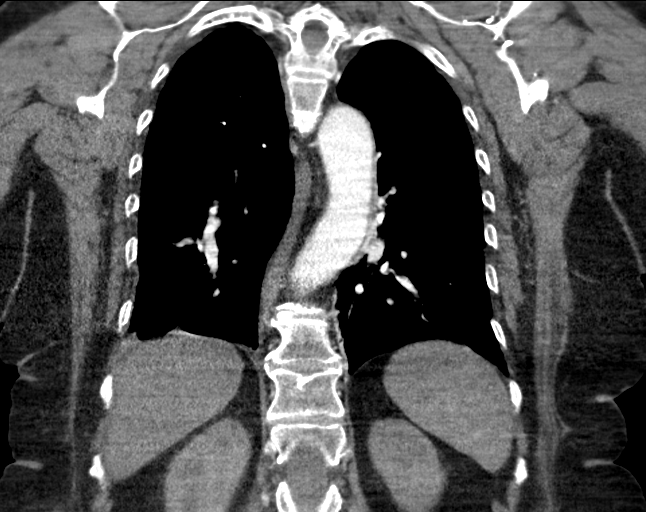

[18 of 46 positions shown; findings below may reference images not displayed]

FINDINGS: Cardiovascular: Normal heart size. Thoracic aortic vascular
calcifications. Adequate opacification of the pulmonary arterial
system. No intraluminal filling defects identified to suggest acute
pulmonary embolus.

Mediastinum/Nodes: No axillary lymphadenopathy. Similar-appearing 8
mm right paratracheal lymph node (image 15; series 4). No definite
hilar adenopathy. Normal appearance of the esophagus.

Lungs/Pleura: Central airways are patent. Dependent atelectasis
within the left lower lobe. Left apical bullous change.
Centrilobular and paraseptal emphysematous change. Unchanged 5 mm
left lower lobe nodule (image 98; series 10). Interval development
of subpleural consolidation within the right lower lobe. Small right
pleural effusion. Stable volume loss paddle with right upper
lobectomy.

Upper Abdomen: No acute process. Renal cysts. Small hiatal hernia.

Musculoskeletal: Thoracic spine degenerative changes. No aggressive
or acute appearing osseous lesions.

Review of the MIP images confirms the above findings.
IMPRESSION: 1. No evidence for acute pulmonary embolus.
2. Interval development of subpleural consolidation within the right
lower lobe which may represent infection. Recommend follow-up chest
CT in 3 months to ensure resolution.
3. Small right pleural effusion.
4. Emphysema and aortic atherosclerosis.
5. Stable postsurgical changes right hemithorax.

Aortic Atherosclerosis ([H9]-[H9]) and Emphysema ([H9]-[H9]).

## 2019-06-02 IMAGING — DX DG CHEST 1V PORT
1 series · 1 of 1 positions shown · non-contrast
Comparison: [DATE]

CLINICAL DATA: Cough.

EXAM:
PORTABLE CHEST 1 VIEW

[chest ap]
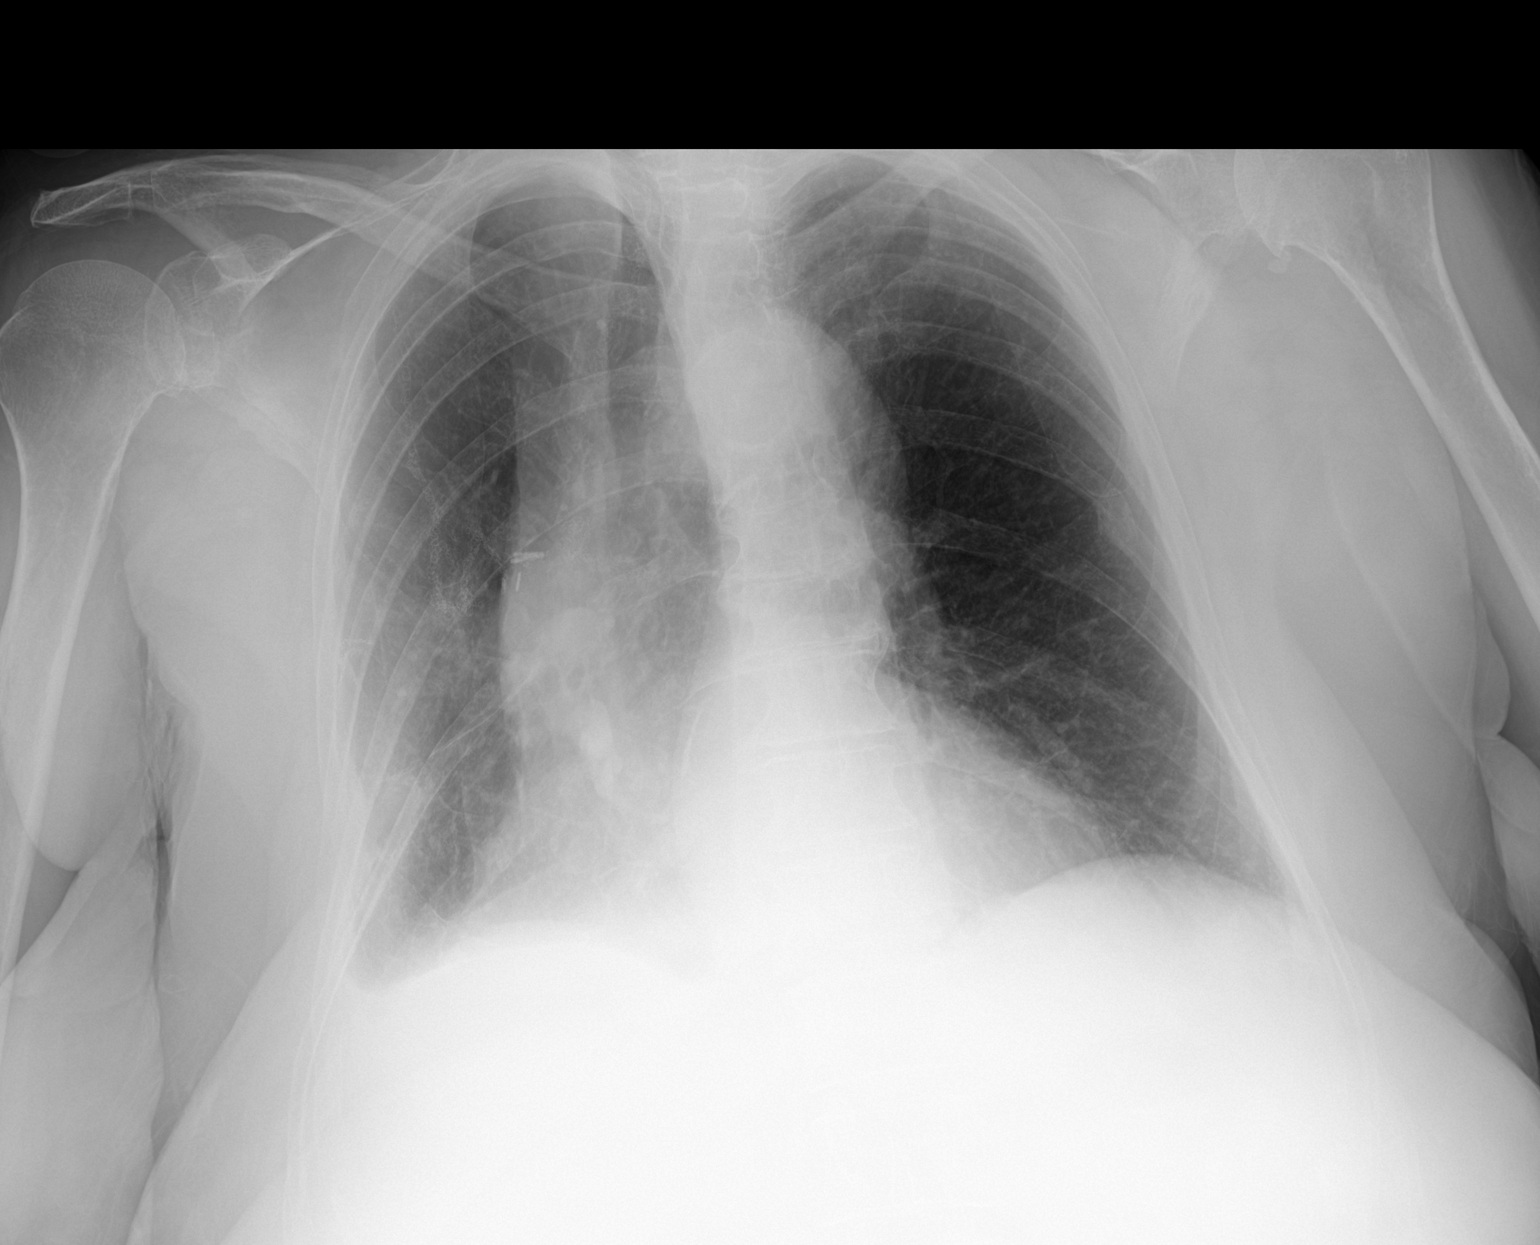

[1 of 1 positions shown; findings below may reference images not displayed]

FINDINGS: Patient slightly rotated to the right. Stable chronic and
postsurgical changes over the right lung. Left lung is clear.
Cardiomediastinal silhouette and remainder of the exam is unchanged.
IMPRESSION: No active disease.

## 2019-06-02 MED ORDER — SODIUM CHLORIDE 0.9 % IV SOLN
2.0000 g | INTRAVENOUS | Status: DC
  Administered 2019-06-02 – 2019-06-04 (×3): 2 g via INTRAVENOUS
  Filled 2019-06-02 (×3): qty 2

## 2019-06-02 MED ORDER — POTASSIUM CHLORIDE CRYS ER 20 MEQ PO TBCR
40.0000 meq | EXTENDED_RELEASE_TABLET | Freq: Once | ORAL | Status: AC
  Administered 2019-06-02: 40 meq via ORAL
  Filled 2019-06-02: qty 2

## 2019-06-02 MED ORDER — ENSURE ENLIVE PO LIQD
237.0000 mL | Freq: Two times a day (BID) | ORAL | Status: DC
  Administered 2019-06-03 – 2019-06-05 (×4): 237 mL via ORAL

## 2019-06-02 MED ORDER — FLEET ENEMA 7-19 GM/118ML RE ENEM
1.0000 | ENEMA | Freq: Once | RECTAL | Status: AC
  Administered 2019-06-02: 1 via RECTAL
  Filled 2019-06-02: qty 1

## 2019-06-02 MED ORDER — SODIUM CHLORIDE 0.9 % IV SOLN
500.0000 mg | INTRAVENOUS | Status: DC
  Administered 2019-06-02: 500 mg via INTRAVENOUS
  Filled 2019-06-02: qty 500

## 2019-06-02 MED ORDER — IOHEXOL 350 MG/ML SOLN
80.0000 mL | Freq: Once | INTRAVENOUS | Status: AC | PRN
  Administered 2019-06-02: 80 mL via INTRAVENOUS

## 2019-06-02 MED ORDER — SODIUM CHLORIDE 0.9 % IV SOLN
INTRAVENOUS | Status: DC
  Administered 2019-06-02 – 2019-06-03 (×2): via INTRAVENOUS

## 2019-06-02 MED ORDER — VANCOMYCIN HCL 10 G IV SOLR
1500.0000 mg | Freq: Once | INTRAVENOUS | Status: AC
  Administered 2019-06-02: 1500 mg via INTRAVENOUS
  Filled 2019-06-02: qty 1500

## 2019-06-02 MED ORDER — SENNOSIDES-DOCUSATE SODIUM 8.6-50 MG PO TABS
1.0000 | ORAL_TABLET | Freq: Two times a day (BID) | ORAL | Status: DC
  Administered 2019-06-02 – 2019-06-05 (×7): 1 via ORAL
  Filled 2019-06-02 (×7): qty 1

## 2019-06-02 MED ORDER — VANCOMYCIN HCL IN DEXTROSE 1-5 GM/200ML-% IV SOLN: 1000.0000 mg | INTRAVENOUS | Status: DC

## 2019-06-02 MED ORDER — TRAMADOL HCL 50 MG PO TABS
50.0000 mg | ORAL_TABLET | Freq: Four times a day (QID) | ORAL | Status: DC | PRN
  Administered 2019-06-02 – 2019-06-03 (×2): 50 mg via ORAL
  Filled 2019-06-02 (×2): qty 1

## 2019-06-02 MED ORDER — LIP MEDEX EX OINT
TOPICAL_OINTMENT | CUTANEOUS | Status: AC
  Administered 2019-06-02: 15:00:00
  Filled 2019-06-02: qty 7

## 2019-06-02 NOTE — Progress Notes (Signed)
Physical Therapy Treatment Patient Details Name: AARIAH GODETTE MRN: [id number] DOB: [birth year] Today's Date: 06/02/2019    History of Present Illness 78 yo s/p R TKA 05/27/19. PMH: VATS 2017, HTN, COPD, back pain-chronic pain, R ankle surgery, L hip surgery    PT Comments    Pt continues to require Total +2 for bed mobility. She sat EOB for ~ 5 minutes with Mod assist for static sitting balance. Pt was again unable to sit unsupported on today. She was leaning to L side on today. Inconsistently following 1 step commands. She continues to process poorly.  Pt c/o back and bil shoulder pain during session on today. Will continue to follow and progress activity as able. Recommend SNF.    Follow Up Recommendations  Follow surgeon's recommendation for DC plan and follow-up therapies;SNF;Supervision/Assistance - 24 hour     Equipment Recommendations  None recommended by PT    Recommendations for Other Services       Precautions / Restrictions Precautions Precautions: Fall;Knee Precaution Comments: high fall risk Restrictions Weight Bearing Restrictions: No Other Position/Activity Restrictions: WBAT    Mobility  Bed Mobility Overal bed mobility: Needs Assistance Bed Mobility: Rolling;Supine to Sit;Sit to Supine Rolling: Mod assist;+2 for physical assistance;+2 for safety/equipment   Supine to sit: Total assist;+2 for physical assistance;+2 for safety/equipment;HOB elevated Sit to supine: Total assist;HOB elevated;+2 for safety/equipment   General bed mobility comments: Multimodal cueing required. Poor processing. Inconsistent command following. Assist for trunk and bil LEs. Utilized bedpad for scooting, positioning. Sat EOB for ~ 5 minutes with Mod assist for static sitting balance. Today, pt was leaning moderately/heavily to L side.  Transfers                 General transfer comment: NT-unable to safely attempt on today. Pt is weak and unable to sit unsupported at  EOB  Ambulation/Gait                 Stairs             Wheelchair Mobility    Modified Rankin (Stroke Patients Only)       Balance Overall balance assessment: Needs assistance;History of Falls   Sitting balance-Leahy Scale: Poor Sitting balance - Comments: Mod-heavy L lean. With time, at least 5 minutes, pt was able to sit briefly with UE support with Min assist. Pt could/would not maintain trunk at midline at any point during session.                                    Cognition Arousal/Alertness: Awake/alert Behavior During Therapy: WFL for tasks assessed/performed Overall Cognitive Status: No family/caregiver present to determine baseline cognitive functioning Area of Impairment: Memory;Following commands;Problem solving;Safety/judgement;Awareness                   Current Attention Level: Sustained Memory: Decreased recall of precautions;Decreased short-term memory Following Commands: Follows one step commands inconsistently Safety/Judgement: Decreased awareness of safety;Decreased awareness of deficits   Problem Solving: Slow processing;Decreased initiation;Difficulty sequencing;Requires verbal cues;Requires tactile cues        Exercises      General Comments        Pertinent Vitals/Pain Pain Assessment: Faces Faces Pain Scale: Hurts whole lot Pain Location: back, bil shoulders Pain Descriptors / Indicators: Grimacing;Guarding;Discomfort;Aching;Sore;Moaning Pain Intervention(s): Limited activity within patient's tolerance;Monitored during session;Repositioned    Home Living  Prior Function            PT Goals (current goals can now be found in the care plan section) Progress towards PT goals: Not progressing toward goals - comment    Frequency    7X/week      PT Plan Current plan remains appropriate    Co-evaluation              AM-PAC PT "6 Clicks" Mobility   Outcome  Measure  Help needed turning from your back to your side while in a flat bed without using bedrails?: Total Help needed moving from lying on your back to sitting on the side of a flat bed without using bedrails?: Total Help needed moving to and from a bed to a chair (including a wheelchair)?: Total Help needed standing up from a chair using your arms (e.g., wheelchair or bedside chair)?: Total Help needed to walk in hospital room?: Total Help needed climbing 3-5 steps with a railing? : Total 6 Click Score: 6    End of Session   Activity Tolerance: Patient limited by pain;Patient limited by fatigue(Limited by cogntion) Patient left: in bed;with call bell/phone within reach;with bed alarm set   PT Visit Diagnosis: Other abnormalities of gait and mobility (R26.89);Difficulty in walking, not elsewhere classified (R26.2);Pain;Repeated falls (R29.6);History of falling (Z91.81);Muscle weakness (generalized) (M62.81) Pain - Right/Left: Right Pain - part of body: Knee(back, shoulders)     Time: 1194-1740 PT Time Calculation (min) (ACUTE ONLY): 22 min  Charges:  $Therapeutic Activity: 8-22 mins             Weston Anna, PT Acute Rehabilitation Services Pager: [phone] Office: [phone]

## 2019-06-02 NOTE — Consult Note (Addendum)
Medical Consultation   [name]  LKG:[id number]  DOB: November 13, 1940  DOA: 05/27/2019  PCP: Wenda Low, MD  Outpatient Specialists: Dr Ronnie Derby   Requesting physician: Patrecia Pace PA   Reason for consultation: Confusion, fever, chest pain.    History of Present Illness: [name] is an 78 y.o. 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  Patient was supposed to be discharged on 05/28/2019.  Patient was not discharged that day because she did not meet PT goals.  Per nurses report patient has been declining over the last couple of days initially requiring 1 assist, now requiring to assist for ADLs.  Patient has been confused over the last couple of days.  On admission she was alert and oriented x3.  Over the course of hospitalization she has become confused.  She has become very weak to the point that she is not able to hold with her hand her medications.  Patient has been complaining of pain generalized, she report pain in her shoulders and her hands chest per nurse report. [facility] was consulted on 06/02/2019 to help assist with medical management, confusion, fever and chest pain.  Patient seen and evaluated.  Patient is alert, she is oriented to person and place.  Not to situation.  She does not know why she is in the hospital.  She is following commands.  She is complaining of abdominal pain.  She is also complaining of shoulders and upper extremities  Pains.  She is unable to tell me when was her last bowel movement.  Patient had a mild fever with a  temperature 101.4 last night 11/7.  Labs sodium 134, potassium 3.4, BUN 22, creatinine 0.78, CRP 20, white blood cell 10, hemoglobin 9, platelets 320.  UA -0-5 white blood cell.  Chest x-ray; no active cardiopulmonary disease.   Review of Systems:  ROS As per HPI otherwise 10 point review of systems negative.     Past Medical History: Past Medical History:  Diagnosis Date  . Adenocarcinoma of right lung, stage 1 ([place]) 07/28/2016  . Anxiety   . Asthma   . Cancer ([place])    uterine  . COPD (chronic obstructive pulmonary disease) ([place])   . Coronary artery disease    mild nonobstructive by 2019 cath  . Cyst    left side of neck  . Depression   . GERD (gastroesophageal reflux disease)   . Hyperlipidemia   . Hypertension   . Low back pain   . Osteoarthritis   . Osteoporosis   . Takotsubo cardiomyopathy    EF recovered to 50-55% by 01/23/18 echo    Past Surgical History: Past Surgical History:  Procedure Laterality Date  . ANKLE SURGERY Right    horse accident  . APPENDECTOMY    . BACK SURGERY    . CHOLECYSTECTOMY    . EYE SURGERY     lense implant  . FRACTURE SURGERY    . HIP SURGERY  01/2010   Left Hip   . INNER EAR SURGERY Bilateral 1970   related to severe  ear infections  . KNEE SURGERY     Right knee  . LEFT HEART CATH AND CORONARY ANGIOGRAPHY N/A 12/11/2017   Procedure: LEFT HEART CATH AND CORONARY ANGIOGRAPHY;  Surgeon: Jettie Booze, MD;  Location: [facility];  Service: Cardiovascular;  Laterality: N/A;  . LOBECTOMY Right 05/09/2016   Procedure: RIGHT UPPER LOBECTOMY;  Surgeon: [name], MD;  Location: [facility];  Service: Thoracic;  Laterality: Right;  . MASS EXCISION  08/25/2011   Procedure: EXCISION MASS;  Surgeon: Harl Bowie, MD;  Location: [facility];  Service: General;  Laterality: N/A;  excision left neck mass  . TOTAL ABDOMINAL HYSTERECTOMY    . TOTAL KNEE ARTHROPLASTY Right 05/27/2019   Procedure: TOTAL KNEE  ARTHROPLASTY;  Surgeon: Vickey Huger, MD;  Location: [facility];  Service: Orthopedics;  Laterality: Right;  75 mins needed for length of case  . VIDEO ASSISTED THORACOSCOPY (VATS)/WEDGE RESECTION Right 05/09/2016   Procedure: VIDEO ASSISTED THORACOSCOPY (VATS)/WEDGE RESECTION;  Surgeon: [name], MD;  Location: [facility];  Service: Thoracic;  Laterality: Right;     Allergies:   Allergies  Allergen Reactions  . Boniva [Ibandronate Sodium] Other (See Comments)    UNSPECIFIED REACTION   . Lyrica [Pregabalin] Nausea And Vomiting    Dizziness and blurred vision as well  . Levofloxacin      Social History:  reports that she quit smoking about 7 years ago. Her smoking use included cigarettes. She has a 102.00 pack-year smoking history. She has never used smokeless tobacco. She reports that she does not drink alcohol or use drugs.   Family History: Family History  Problem Relation Age of Onset  . Heart disease Mother   . Heart disease Father   . Cancer Sister        #1, breast  . Heart disease Brother        had CABG      Physical Exam: Vitals:   06/01/19 2041 06/01/19 2142 06/02/19 0555 06/02/19 0822  BP:  (!) 155/87 (!) 156/84   Pulse:  96 88   Resp:  17 18   Temp:  (!) 100.4 F (38 C) 99.2 F (37.3 C)   TempSrc:  Oral Oral   SpO2: 90% 92% 93% 91%  Weight:      Height:        Constitutional:   Alert and awake, oriented x2, not in any acute distress. Eyes: PERLA, EOMI, irises appear normal, anicteric sclera,  ENMT: external ears and nose appear normal, hard of hearing            Lips appears normal, oropharynx mucosa dry , tongue, posterior pharynx appear normal  Neck: neck appears normal, no masses, normal ROM, no thyromegaly, no JVD  CVS: S1-S2 clear, no murmur rubs or gallops, no LE edema, normal pedal pulses  Respiratory:  clear to auscultation bilaterally, no wheezing, rales or rhonchi. Respiratory effort normal. No accessory muscle use.  Abdomen: soft  nontender, nondistended, normal bowel sounds, no hepatosplenomegaly, no hernias  Musculoskeletal: : no cyanosis, clubbing . Right knee with edema, ecchymosis.                   Neuro: Cranial nerves II-XII intact, strength, sensation, reflexes, upper extremities with limit motion range due to pain.  Psych: unable to assess Skin: no rashes or lesions or ulcers, no induration or nodules     Data reviewed:  I have personally reviewed following labs and imaging studies  Labs:  CBC: Recent Labs  Lab 05/28/19 0240 05/29/19 0230 05/30/19 0249 06/02/19 0707  WBC 9.7 12.9* 9.8 10.7*  HGB 10.0* 10.5* 9.9* 10.2*  HCT 31.7* 34.1* 31.8* 31.7*  MCV 90.1 89.3 88.8 88.3  PLT 240 254 258 382    Basic Metabolic Panel: Recent Labs  Lab 05/28/19 0240 06/02/19 0707  NA 139 134*  K 3.7 3.4*  CL 106 99  CO2 24 23  GLUCOSE 97 95  BUN 16 22  CREATININE 1.16* 0.78  CALCIUM 8.3* 8.2*   GFR Estimated Creatinine Clearance: 55.1 mL/min (by C-G formula based on SCr of 0.78 mg/dL). Liver Function Tests: No results for input(s): AST, ALT, ALKPHOS, BILITOT, PROT, ALBUMIN in the last 168 hours. No results for input(s): LIPASE, AMYLASE in the last 168 hours. No results for input(s): AMMONIA in the last 168 hours. Coagulation profile No results for input(s): INR, PROTIME in the last 168 hours.  Cardiac Enzymes: No results for input(s): CKTOTAL, CKMB, CKMBINDEX, TROPONINI in the last 168 hours. BNP: Invalid input(s): POCBNP CBG: No results for input(s): GLUCAP in the last 168 hours. D-Dimer No results for input(s): DDIMER in the last 72 hours. Hgb A1c No results for input(s): HGBA1C in the last 72 hours. Lipid Profile No results for input(s): CHOL, HDL, LDLCALC, TRIG, CHOLHDL, LDLDIRECT in the last 72 hours. Thyroid function studies No results for input(s): TSH, T4TOTAL, T3FREE, THYROIDAB in the last 72 hours.  Invalid input(s): FREET3 Anemia work up No results for input(s): VITAMINB12,  FOLATE, FERRITIN, TIBC, IRON, RETICCTPCT in the last 72 hours. Urinalysis    Component Value Date/Time   COLORURINE AMBER (A) 06/01/2019 2100   APPEARANCEUR CLEAR 06/01/2019 2100   LABSPEC 1.023 06/01/2019 2100   PHURINE 6.0 06/01/2019 2100   GLUCOSEU NEGATIVE 06/01/2019 2100   HGBUR SMALL (A) 06/01/2019 2100   BILIRUBINUR NEGATIVE 06/01/2019 2100   KETONESUR 5 (A) 06/01/2019 2100   PROTEINUR 100 (A) 06/01/2019 2100   UROBILINOGEN 0.2 04/27/2015 1229   NITRITE NEGATIVE 06/01/2019 2100   LEUKOCYTESUR NEGATIVE 06/01/2019 2100     Microbiology Recent Results (from the past 240 hour(s))  SARS CORONAVIRUS 2 (TAT 6-24 HRS) Nasopharyngeal Nasopharyngeal Swab     Status: None   Collection Time: 05/25/19  5:45 PM   Specimen: Nasopharyngeal Swab  Result Value Ref Range Status   SARS Coronavirus 2 NEGATIVE NEGATIVE Final    Comment: (NOTE) SARS-CoV-2 target nucleic acids are NOT DETECTED. The SARS-CoV-2 RNA is generally detectable in upper and lower respiratory specimens during the acute phase of infection. Negative results do not preclude SARS-CoV-2 infection, do not rule out co-infections with other pathogens, and should not be used as the sole basis for treatment or other patient management decisions. Negative results must be combined with clinical observations, patient history, and epidemiological information. The expected result is Negative. Fact Sheet for Patients: SugarRoll.be Fact Sheet for Healthcare Providers: https://www.woods-mathews.com/ This test is not yet approved or cleared by the [place] FDA and  has been authorized for detection and/or diagnosis of SARS-CoV-2 by FDA under an Emergency Use Authorization (EUA). This EUA will remain  in effect (meaning this test can be used) for the duration of the COVID-19 declaration under Section 56 4(b)(1) of the Act, 21 U.S.C. section 360bbb-3(b)(1), unless the authorization is  terminated or revoked sooner. Performed at [facility], [place] [address], [place], [place] [address]        Inpatient Medications:   Scheduled Meds: . amLODipine  5  mg Oral Daily  . aspirin EC  325 mg Oral BID  . atorvastatin  20 mg Oral Daily  . docusate sodium  100 mg Oral BID  . ferrous sulfate  325 mg Oral TID PC  . fluticasone furoate-vilanterol  1 puff Inhalation Daily  . gabapentin  400 mg Oral TID  . lisinopril  10 mg Oral q morning - 10a  . metoCLOPramide  5 mg Oral BID  . metoprolol tartrate  12.5 mg Oral BID  . mometasone-formoterol  2 puff Inhalation BID  . pantoprazole  40 mg Oral Q0600  . potassium chloride  40 mEq Oral Once  . raloxifene  60 mg Oral Daily  . sertraline  100 mg Oral Daily  . umeclidinium bromide  1 puff Inhalation Daily   Continuous Infusions: . sodium chloride       Radiological Exams on Admission: Dg Chest Port 1 View  Result Date: 06/02/2019 CLINICAL DATA:  Cough. EXAM: PORTABLE CHEST 1 VIEW COMPARISON:  10/10/2018 FINDINGS: Patient slightly rotated to the right. Stable chronic and postsurgical changes over the right lung. Left lung is clear. Cardiomediastinal silhouette and remainder of the exam is unchanged. IMPRESSION: No active disease. Electronically Signed   By: Marin Olp M.D.   On: 06/02/2019 05:32    Impression/Recommendations Active Problems:   COPD (chronic obstructive pulmonary disease) ([facility])   Essential hypertension   Hypercholesterolemia   CKD (chronic kidney disease), stage III ([facility])   Adenocarcinoma of right lung, stage 1 ([facility])   Neuropathic pain syndrome (non-herpetic)   Chest pain   S/P total knee replacement   Acute metabolic encephalopathy  1-Acute Metabolic encephalopathy;  Patient has become more confuse and weak over hospitalization course.  She is known to respond dramatic to pain.  Will check TSH, Ammonia level, CT head. Work up for infection done with negative UA and chest x ray.  Also concern  for adverse effect from medications. Will discontinue oxycodone, known to cause confusion on patient.  Delirium precaution.   2-Fever, mild;  Chest x ray negative, UA negative.  Monitor fever curve.  Check doppler LE rule out DVT.  Incentive spirometry.  Addendum; CT angio negative for PE, showed PNA. Start IV antibiotics.   3-Chest pain;  Per staff patient report chest pain. She denies chest pain to me.  EKG sinus rhythm, no ST elevation notice.  Will cycle cardiac enzymes, she has history of NSTEMI. Will check D dime. If positive will need CT angio rule out PE>  Addendum;  D dimer elevated. CT angio ordered.   4-Abdominal pain; suspect related to constipation.  Will check KUB, might need enema.  KUB negative for obstruction, with moderate  fecal retention rectum. Fleet enema ordered.   5-FTT;  Prior history of neuropathic pain, she is high risk for delirium.  Limits sedatives.  Frequent orientation.  Continue PT, OT, out of bed.  Nutrition consult.   6-Hyponatremia; start gentle hydration.  7-Hypokalemia; replete orally.  8-COPD; stable. Continue with inhales.  Monitor for hypoxemia overnight.  9-HTN; continue with Norvasc and lisinopril, metoprolol/  10-Neuropathic pain; continue with gabapentin.  11-Non Obstructive CAD; continue with statins, BB blocker, aspirin.    Thank you for this consultation.  Our [facility] hospitalist team will follow the patient with you.   Time Spent: 75 minutes.   Elmarie Shiley M.D. [facility] 06/02/2019, 8:24 AM

## 2019-06-02 NOTE — Progress Notes (Signed)
Bilateral lower extremity venous duplex completed. Refer to "CV Proc" under chart review to view preliminary results.  06/02/2019 9:30 AM Kelby Aline., MHA, RVT, RDCS, RDMS

## 2019-06-02 NOTE — Progress Notes (Signed)
Pharmacy Antibiotic Note  [name] is a 78 y.o. female admitted on 05/27/2019 for elective R TKA.  Postoperatively was slow to progress with physical therapy and subsequently developed confusion, fever, and chest pain.  CT chest 06/02/2019 negative for pulmonary embolus but suggestive of possible pneumonia.  Empiric ceftriaxone and azithromycin started by MD and Pharmacy was consulted for vancomycin dosing.  H/O CKD III noted.  Plan: Vancomycin 1500 mg IV x 1, then 1000 mg IV Q 24 hrs.  -Goal AUC 400-550. -Expected AUC: 450 -SCr used: 0.8 Ceftriaxone 2 g IV q24h and Azithromycin 500 mg IV q24h as ordered by MD Follow clinical course, renal function, length of therapy, potential for de-escalation   Height: 5\' 2"  (157.5 cm) Weight: 155 lb 12.8 oz (70.7 kg) IBW/kg (Calculated) : 50.1  Temp (24hrs), Avg:99.5 F (37.5 C), Min:98.8 F (37.1 C), Max:100.4 F (38 C)  Recent Labs  Lab 05/28/19 0240 05/29/19 0230 05/30/19 0249 06/02/19 0707  WBC 9.7 12.9* 9.8 10.7*  CREATININE 1.16*  --   --  0.78    Estimated Creatinine Clearance: 55.1 mL/min (by C-G formula based on SCr of 0.78 mg/dL).    Allergies  Allergen Reactions  . Boniva [Ibandronate Sodium] Other (See Comments)    UNSPECIFIED REACTION   . Lyrica [Pregabalin] Nausea And Vomiting    Dizziness and blurred vision as well  . Levofloxacin     Antimicrobials this admission: 11/8 ceftriaxone >>  11/8 azithromycin >>  11/8 vancomycin>>  Dose adjustments this admission: n/a  Microbiology results: 10/27 MRSA PCR: POSITIVE  Thank you for allowing pharmacy to be a part of this patient's care.  Margretta Sidle K 06/02/2019 4:03 PM

## 2019-06-02 NOTE — Progress Notes (Signed)
Patient returned from CT angio gram; pt is alert; no signs and symptoms of distress; will continue to monitor

## 2019-06-02 NOTE — Progress Notes (Signed)
Orthopaedic Trauma Progress Note  S: Patient seen for the first time by myself this morning. Patient underwent right total knee arthroplasty with Dr. Ronnie Derby on 05/27/2019. Patient was doing decent with physcial therapy at the beginning of the week and was scheduled for discharge on 05/28/2019 but did not pass therapy on that day and was therefor not discharged. Per nursing, the patient has been declining in terms of her mobility and mental status over the past several day. She has moved from a 1 person assist for mobility to a 2 person assist. CXR was was ordered overnight for complaint of deep, persistent cough. CXR was negative for any acute processes. UA was unremarkable. This morning the patient is awake and arousable. Knows she is at [facility] but does not know why. Does not know month or year. Complaining of chest pain. Have ordered CBC, BMP, ESR, CRP.   O:  Vitals:   06/02/19 0555 06/02/19 0822  BP: (!) 156/84   Pulse: 88   Resp: 18   Temp: 99.2 F (37.3 C)   SpO2: 93% 91%    General - Laying in bed, NAD. Awake and alert to person but not to place and time. Follows commands. Symmetric smile. Able to stick her tongue out.   Right Lower Extremity - Dressing in place is clean, dry, intact. Bruising and swelling about the knee and lower leg. Tolerates very minimal knee motion. Mild tenderness with compression of calf. Good strength. Ankle dorsiflexion/plantarflexion intact. Sensation grossly intact. 2+ DP pulse  Left Lower Extremity - Skin without lesions. No tenderness to palpation. Full knee motion. Able to wiggle toes and dorsiflex/plantarflex the ankle but weaker than contralateral side. Sensation grossly intact. 2+ DP pulse  Bilateral Upper Extremities: Skin without lesions. No tenderness to palpation. Moving both arms. Grip strength stronger on the right than the left. Sensation grossly intact. neurovascularly intact.   Imaging: Chest x-ray negative for any acute  processes  Labs:  Results for orders placed or performed during the hospital encounter of 05/27/19 (from the past 24 hour(s))  Urinalysis, Routine w reflex microscopic     Status: Abnormal   Collection Time: 06/01/19  9:00 PM  Result Value Ref Range   Color, Urine AMBER (A) YELLOW   APPearance CLEAR CLEAR   Specific Gravity, Urine 1.023 1.005 - 1.030   pH 6.0 5.0 - 8.0   Glucose, UA NEGATIVE NEGATIVE mg/dL   Hgb urine dipstick SMALL (A) NEGATIVE   Bilirubin Urine NEGATIVE NEGATIVE   Ketones, ur 5 (A) NEGATIVE mg/dL   Protein, ur 100 (A) NEGATIVE mg/dL   Nitrite NEGATIVE NEGATIVE   Leukocytes,Ua NEGATIVE NEGATIVE   RBC / HPF 0-5 0 - 5 RBC/hpf   WBC, UA 0-5 0 - 5 WBC/hpf   Bacteria, UA NONE SEEN NONE SEEN   Squamous Epithelial / LPF 0-5 0 - 5  CBC     Status: Abnormal   Collection Time: 06/02/19  7:07 AM  Result Value Ref Range   WBC 10.7 (H) 4.0 - 10.5 K/uL   RBC 3.59 (L) 3.87 - 5.11 MIL/uL   Hemoglobin 10.2 (L) 12.0 - 15.0 g/dL   HCT 31.7 (L) 36.0 - 46.0 %   MCV 88.3 80.0 - 100.0 fL   MCH 28.4 26.0 - 34.0 pg   MCHC 32.2 30.0 - 36.0 g/dL   RDW 15.6 (H) 11.5 - 15.5 %   Platelets 320 150 - 400 K/uL   nRBC 0.0 0.0 - 0.2 %  Basic metabolic  panel     Status: Abnormal   Collection Time: 06/02/19  7:07 AM  Result Value Ref Range   Sodium 134 (L) 135 - 145 mmol/L   Potassium 3.4 (L) 3.5 - 5.1 mmol/L   Chloride 99 98 - 111 mmol/L   CO2 23 22 - 32 mmol/L   Glucose, Bld 95 70 - 99 mg/dL   BUN 22 8 - 23 mg/dL   Creatinine, Ser 0.78 0.44 - 1.00 mg/dL   Calcium 8.2 (L) 8.9 - 10.3 mg/dL   GFR calc non Af Amer >60 >60 mL/min   GFR calc Af Amer >60 >60 mL/min   Anion gap 12 5 - 15  Sedimentation rate     Status: Abnormal   Collection Time: 06/02/19  7:07 AM  Result Value Ref Range   Sed Rate 140 (H) 0 - 22 mm/hr  C-reactive protein     Status: Abnormal   Collection Time: 06/02/19  7:07 AM  Result Value Ref Range   CRP 20.8 (H) <1.0 mg/dL  D-dimer, quantitative (not at [facility])      Status: Abnormal   Collection Time: 06/02/19  8:01 AM  Result Value Ref Range   D-Dimer, Quant 6.09 (H) 0.00 - 0.50 ug/mL-FEU  Troponin I (High Sensitivity)     Status: None   Collection Time: 06/02/19  8:01 AM  Result Value Ref Range   Troponin I (High Sensitivity) 6 <18 ng/L    Assessment: 78 year old female s/p right TKA on 05/27/2019 with Dr. Ronnie Derby   Weightbearing: WBAT RLE  Insicional and dressing care: Change dressing as needed  Orthopedic device(s): None   CV/Blood loss: Hgb 10.2 this AM  Pain management: Continue to hold all narcotics  ID: post op Ancef compleete  VTE prophylaxis: Aspirin 325 mg  Foley/Lines: No foley, KVO IVFs  Medical co-morbidities: HTN, hx of lung CA, COPD, Asthma,   Dispo: Have consulted hospitalist service this AM, appreciate their assistance. Continue inpatient care. Up with therapy as tolerated.  Follow - up plan: Follow up with Dr. Irven Coe A. [facility] [phone] (office) [email or web address]

## 2019-06-02 NOTE — Plan of Care (Signed)
Patient not participating well with attempts to get her to move.  UA came back basically unremarkable.  Remains drowsy, but able to be aroused.  Refusing need for any pain meds.

## 2019-06-03 ENCOUNTER — Inpatient Hospital Stay (HOSPITAL_COMMUNITY): Payer: Medicare Other

## 2019-06-03 ENCOUNTER — Other Ambulatory Visit: Payer: Self-pay | Admitting: Thoracic Surgery (Cardiothoracic Vascular Surgery)

## 2019-06-03 DIAGNOSIS — C3491 Malignant neoplasm of unspecified part of right bronchus or lung: Secondary | ICD-10-CM

## 2019-06-03 DIAGNOSIS — R059 Cough, unspecified: Secondary | ICD-10-CM

## 2019-06-03 DIAGNOSIS — R05 Cough: Secondary | ICD-10-CM

## 2019-06-03 LAB — BASIC METABOLIC PANEL
Anion gap: 10 (ref 5–15)
BUN: 22 mg/dL (ref 8–23)
CO2: 22 mmol/L (ref 22–32)
Calcium: 8 mg/dL — ABNORMAL LOW (ref 8.9–10.3)
Chloride: 104 mmol/L (ref 98–111)
Creatinine, Ser: 0.83 mg/dL (ref 0.44–1.00)
GFR calc Af Amer: >60 mL/min (ref >60)
GFR calc non Af Amer: >60 mL/min (ref >60)
Glucose, Bld: 115 mg/dL — ABNORMAL HIGH (ref 70–99)
Potassium: 3.6 mmol/L (ref 3.5–5.1)
Sodium: 136 mmol/L (ref 135–145)

## 2019-06-03 LAB — CBC
HCT: 32.5 % — ABNORMAL LOW (ref 36.0–46.0)
Hemoglobin: 9.7 g/dL — ABNORMAL LOW (ref 12.0–15.0)
MCH: 27.5 pg (ref 26.0–34.0)
MCHC: 29.8 g/dL — ABNORMAL LOW (ref 30.0–36.0)
MCV: 92.1 fL (ref 80.0–100.0)
Platelets: 320 10*3/uL (ref 150–400)
RBC: 3.53 MIL/uL — ABNORMAL LOW (ref 3.87–5.11)
RDW: 15.6 % — ABNORMAL HIGH (ref 11.5–15.5)
WBC: 12.6 10*3/uL — ABNORMAL HIGH (ref 4.0–10.5)
nRBC: 0 % (ref 0.0–0.2)

## 2019-06-03 LAB — SARS CORONAVIRUS 2 (TAT 6-24 HRS): SARS Coronavirus 2: NEGATIVE

## 2019-06-03 IMAGING — MR MR HEAD W/O CM
5 series · 48 of 48 positions shown · non-contrast
Comparison: Head CT [DATE]

CLINICAL DATA: Encephalopathy

EXAM:
MRI HEAD WITHOUT CONTRAST
TECHNIQUE: Multiplanar, multiecho pulse sequences of the brain and surrounding
structures were obtained without intravenous contrast.

[Series 3: DWI · axial · 4.0mm · 1.17mm/px · z∈[-62,+80]mm · 20 of 66 slices shown (1 of 2)]
[im 1/66]
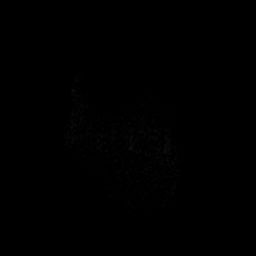
[im 4/66]
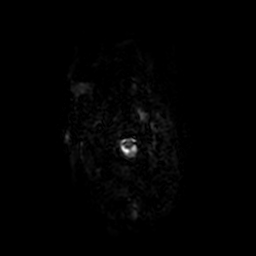
[im 7/66]
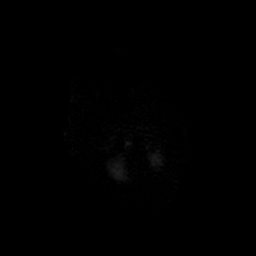
[im 11/66]
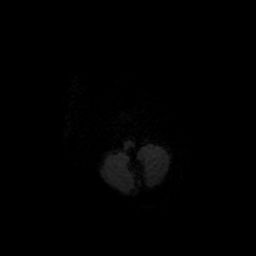
[im 14/66]
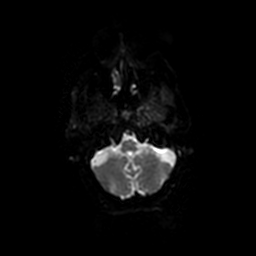
[im 18/66]
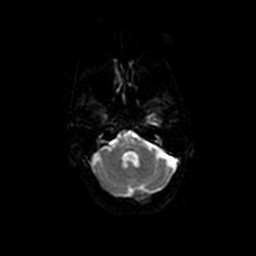
[im 21/66]
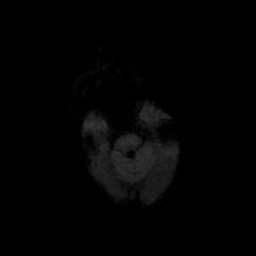
[im 24/66]
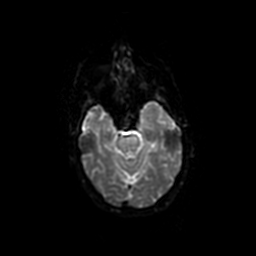
[im 28/66]
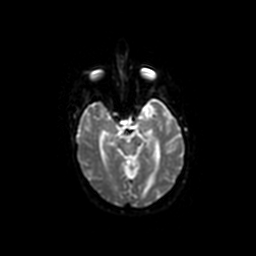
[im 31/66]
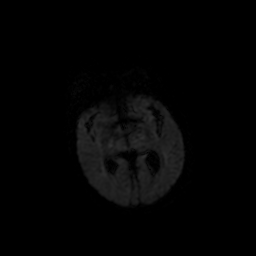
[im 35/66]
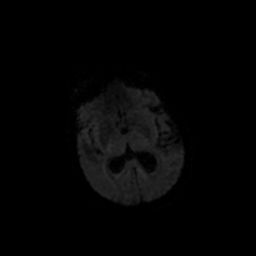
[im 38/66]
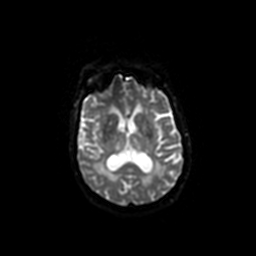
[im 42/66]
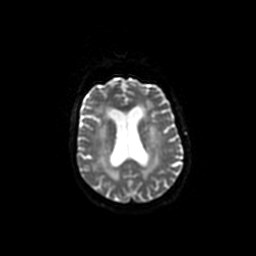
[im 45/66]
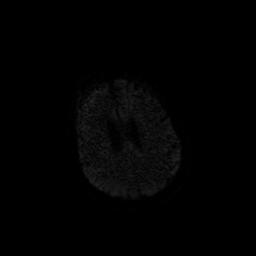
[im 48/66]
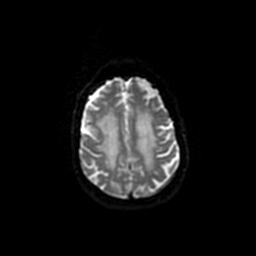
[im 52/66]
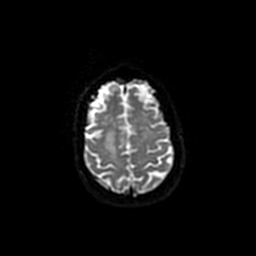
[im 55/66]
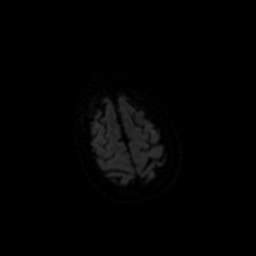
[im 59/66]
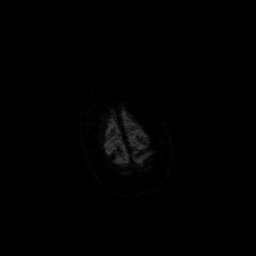
[im 62/66]
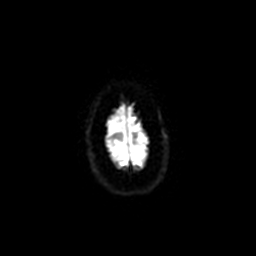
[im 66/66]
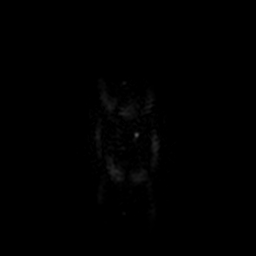

[Series 4: T1 · sagittal · 5.0mm · 0.47mm/px · 5 of 19 slices shown]
[im 1/19]
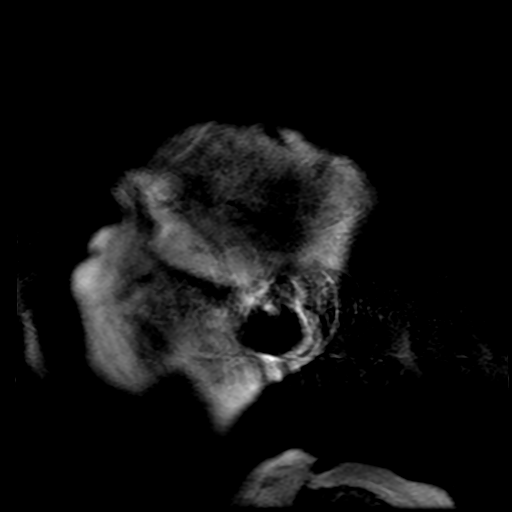
[im 5/19]
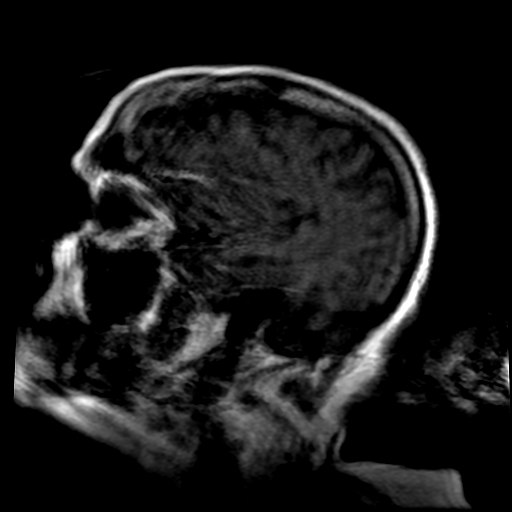
[im 10/19]
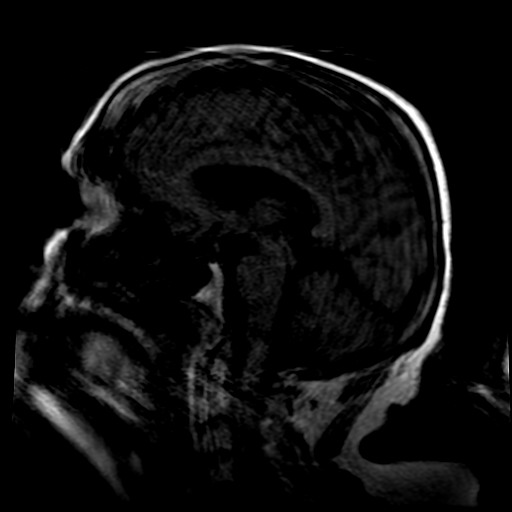
[im 14/19]
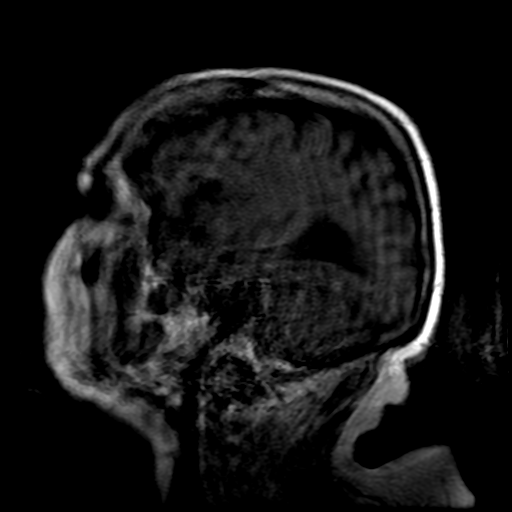
[im 19/19]
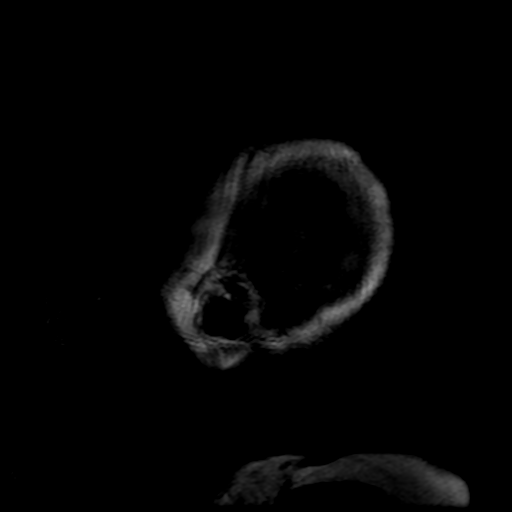

[Series 5: T2 · axial · 5.0mm · 0.86mm/px · z∈[-69,+73]mm · 7 of 25 slices shown]
[im 1/25]
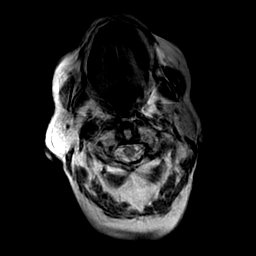
[im 5/25]
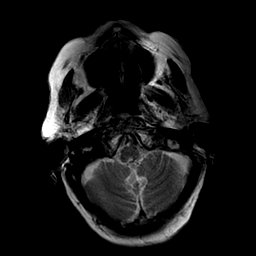
[im 9/25]
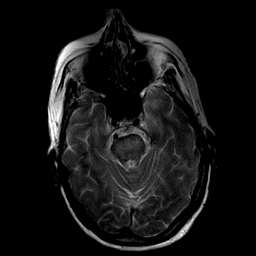
[im 13/25]
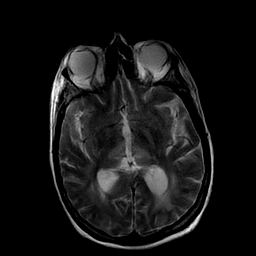
[im 17/25]
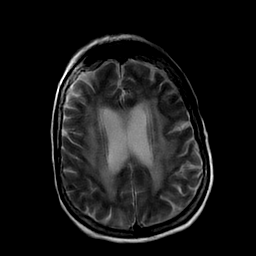
[im 21/25]
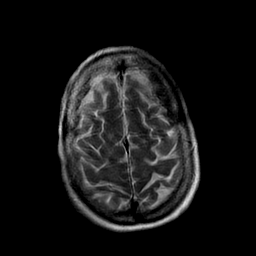
[im 25/25]
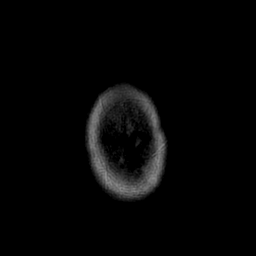

[Series 6: FLAIR · axial · 3.0mm · 0.86mm/px · z∈[-69,+73]mm · 7 of 25 slices shown]
[im 1/25]
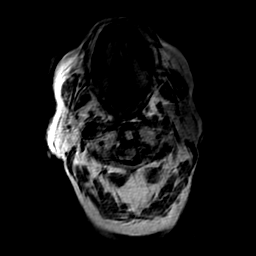
[im 5/25]
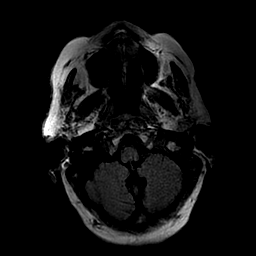
[im 9/25]
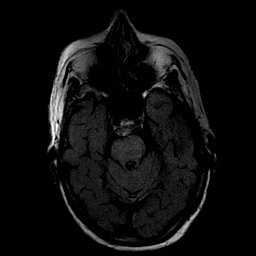
[im 13/25]
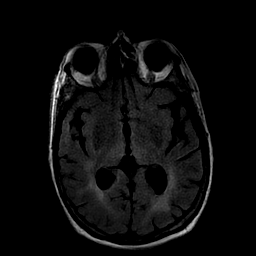
[im 17/25]
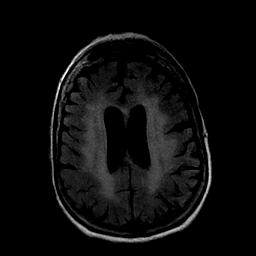
[im 21/25]
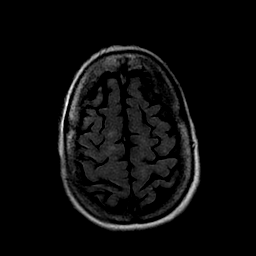
[im 25/25]
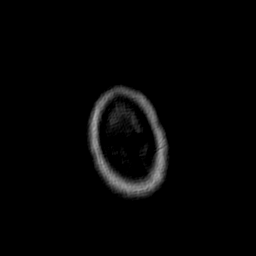

[Series 300: DWI · axial · 4.0mm · 1.17mm/px · z∈[-62,+80]mm · 9 of 33 slices shown (2 of 2)]
[im 1/33]
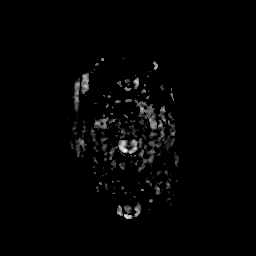
[im 5/33]
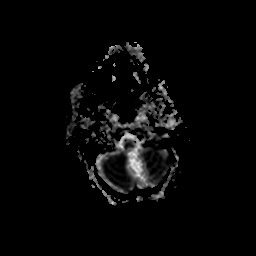
[im 9/33]
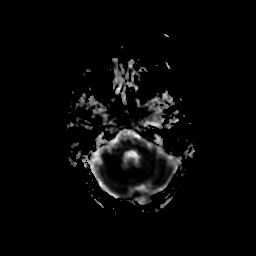
[im 13/33]
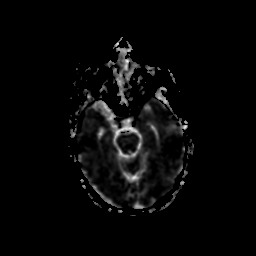
[im 17/33]
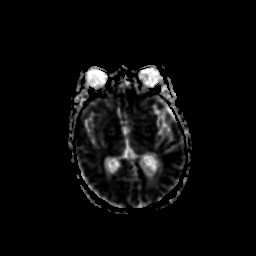
[im 21/33]
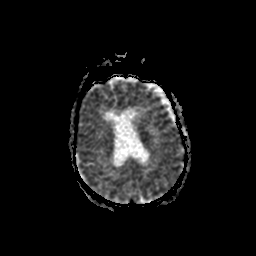
[im 25/33]
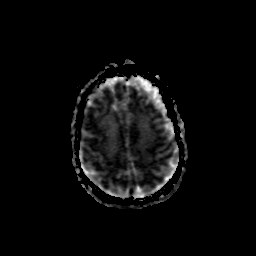
[im 29/33]
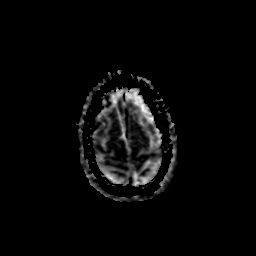
[im 33/33]
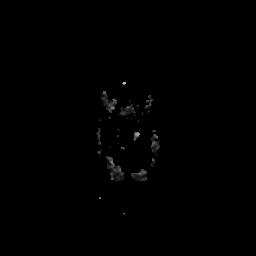

[48 of 48 positions shown; findings below may reference images not displayed]

FINDINGS: The examination had to be discontinued prior to completion due to
patient altered mental status and inability to comply with the
technologist's instructions.

Five series are provided. There is no large acute infarct or
extra-axial collection. There is generalized volume loss, greater
than expected for age. There is diffuse confluent white matter
hyperintensity consistent with chronic small vessel ischemia. No
midline shift or other mass effect. The major flow voids are normal.
Paranasal sinuses are clear. There are bilateral lens replacements.
IMPRESSION: 1. Truncated examination due to patient altered mental status and
inability to comply with the technologist's instructions.
2. Within that limitation, no acute ischemia or other acute
intracranial abnormality.
3. Age advanced volume loss and chronic small vessel ischemia.

## 2019-06-03 MED ORDER — VITAMIN D 25 MCG (1000 UNIT) PO TABS
1000.0000 [IU] | ORAL_TABLET | Freq: Every day | ORAL | Status: DC
  Administered 2019-06-03 – 2019-06-05 (×2): 1000 [IU] via ORAL
  Filled 2019-06-03 (×2): qty 1

## 2019-06-03 MED ORDER — ACETAMINOPHEN 325 MG PO TABS
650.0000 mg | ORAL_TABLET | Freq: Four times a day (QID) | ORAL | Status: DC | PRN
  Administered 2019-06-03 – 2019-06-05 (×5): 650 mg via ORAL
  Filled 2019-06-03 (×5): qty 2

## 2019-06-03 MED ORDER — AMLODIPINE BESYLATE 10 MG PO TABS
10.0000 mg | ORAL_TABLET | Freq: Every day | ORAL | Status: DC
  Administered 2019-06-04 – 2019-06-05 (×2): 10 mg via ORAL
  Filled 2019-06-03 (×2): qty 1

## 2019-06-03 MED ORDER — ADULT MULTIVITAMIN W/MINERALS CH
1.0000 | ORAL_TABLET | Freq: Every day | ORAL | Status: DC
  Administered 2019-06-03 – 2019-06-05 (×3): 1 via ORAL
  Filled 2019-06-03 (×3): qty 1

## 2019-06-03 MED ORDER — VITAMIN D (ERGOCALCIFEROL) 1.25 MG (50000 UNIT) PO CAPS
50000.0000 [IU] | ORAL_CAPSULE | ORAL | Status: DC
  Administered 2019-06-03: 50000 [IU] via ORAL
  Filled 2019-06-03: qty 1

## 2019-06-03 MED ORDER — SODIUM CHLORIDE 0.9 % IV SOLN
500.0000 mg | INTRAVENOUS | Status: DC
  Administered 2019-06-03 – 2019-06-04 (×2): 500 mg via INTRAVENOUS
  Filled 2019-06-03 (×2): qty 500

## 2019-06-03 NOTE — TOC Progression Note (Signed)
Transition of Care [facility]) - Progression Note    Patient Details  Name: [name] MRN: [id number] Date of Birth: 05/02/1941  [facility] CM/SW [place], LCSW Phone Number: 06/03/2019, 9:59 AM  Clinical Narrative:    Authorization received at [facility]. COVID-19 test pending discharge.     Expected Discharge Plan: Skilled Nursing Facility Barriers to Discharge: Other (comment)(COVID-19 test pending discharge)  Expected Discharge Plan and Services Expected Discharge Plan: [place]         Expected Discharge Date: 05/28/19                                     Social Determinants of Health (SDOH) Interventions    Readmission Risk Interventions No flowsheet data found.

## 2019-06-03 NOTE — Progress Notes (Addendum)
Physical Therapy Treatment Patient Details Name: FIONNA MERRIOTT MRN: [id number] DOB: August 03, 1940 Today's Date: 06/03/2019    History of Present Illness 78 yo s/p R TKA 05/27/19. PMH: VATS 2017, HTN, COPD, back pain-chronic pain, R ankle surgery, L hip surgery    PT Comments    Improved cognition on today however pt continues to require significant +2 assistance for mobility. She was able to get OOB and to the recliner on today. Mobility is limited by pain (mostly back and shoulders). Continue to recommend SNF.     Follow Up Recommendations  Follow surgeon's recommendation for DC plan and follow-up therapies;SNF     Equipment Recommendations  None recommended by PT    Recommendations for Other Services       Precautions / Restrictions Precautions Precautions: Fall;Knee Precaution Comments: high fall risk Restrictions Weight Bearing Restrictions: No Other Position/Activity Restrictions: WBAT    Mobility  Bed Mobility Overal bed mobility: Needs Assistance Bed Mobility: Supine to Sit     Supine to sit: Max assist;+2 for physical assistance;+2 for safety/equipment;HOB elevated Sit to supine: Max assist;+2 for physical assistance;+2 for safety/equipment;HOB elevated   General bed mobility comments: Assist for trunk and bil LEs. Multimodal cueing for technique, encouragement. Utilized bedpad for scooting, positioning.  Transfers Overall transfer level: Needs assistance Equipment used: Rolling walker (2 wheeled);None Transfers: Sit to/from [facility] Sit to Stand: Max assist;+2 physical assistance;+2 safety/equipment;From elevated surface Stand pivot transfers: Max assist;+2 physical assistance;+2 safety/equipment       General transfer comment: Sit to stand x 3 with RW. Pt only able to stand for ~10 seconds each attempt. Removed walker and stood pt with 2 person side by side assistance. She was then able to pivot over to recliner.  Ambulation/Gait             General Gait Details: NT-pt unable   Stairs             Wheelchair Mobility    Modified Rankin (Stroke Patients Only)       Balance Overall balance assessment: Needs assistance;History of Falls         Standing balance support: Bilateral upper extremity supported Standing balance-Leahy Scale: Poor                              Cognition Arousal/Alertness: Awake/alert Behavior During Therapy: WFL for tasks assessed/performed Overall Cognitive Status: No family/caregiver present to determine baseline cognitive functioning Area of Impairment: Following commands                     Memory: Decreased short-term memory Following Commands: Follows one step commands with increased time Safety/Judgement: Decreased awareness of safety   Problem Solving: Requires tactile cues;Requires verbal cues;Difficulty sequencing        Exercises  Supine, ankle pumps, bil, 10 reps  Quad Sets, bil, 10 reps  Heel Slides, R, 10 reps, AA  Hip Abduction, R, 5 reps, AA  SLR, R, 10 reps, AA    General Comments        Pertinent Vitals/Pain Pain Assessment: Faces Faces Pain Scale: Hurts even more Pain Location: back, bil shoulders Pain Descriptors / Indicators: Grimacing;Guarding;Discomfort;Aching;Sore;Moaning Pain Intervention(s): Limited activity within patient's tolerance    Home Living                      Prior Function  PT Goals (current goals can now be found in the care plan section) Progress towards PT goals: Progressing toward goals    Frequency    7X/week      PT Plan Current plan remains appropriate    Co-evaluation              AM-PAC PT "6 Clicks" Mobility   Outcome Measure  Help needed turning from your back to your side while in a flat bed without using bedrails?: A Lot Help needed moving from lying on your back to sitting on the side of a flat bed without using bedrails?: A Lot Help needed  moving to and from a bed to a chair (including a wheelchair)?: Total Help needed standing up from a chair using your arms (e.g., wheelchair or bedside chair)?: Total Help needed to walk in hospital room?: Total Help needed climbing 3-5 steps with a railing? : Total 6 Click Score: 8    End of Session Equipment Utilized During Treatment: Gait belt Activity Tolerance: Patient limited by fatigue;Patient limited by pain Patient left: in chair;with call bell/phone within reach Nurse Communication: Need for lift equipment possibly PT Visit Diagnosis: Other abnormalities of gait and mobility (R26.89);Difficulty in walking, not elsewhere classified (R26.2);Pain;Repeated falls (R29.6);History of falling (Z91.81);Muscle weakness (generalized) (M62.81) Pain - Right/Left: Right Pain - part of body: Knee(back, bil shoulders)     Time: 9935-7017 PT Time Calculation (min) (ACUTE ONLY): 24 min  Charges:  $Therapeutic Exercise: 8-22 mins $Therapeutic Activity: 8-22 mins             Weston Anna, PT Acute Rehabilitation Services Pager: [phone] Office: [phone]

## 2019-06-03 NOTE — Progress Notes (Signed)
Called Santa Claus, patients son and provided another update. Pending covid results for placement at [facility]. Made Mr.Collins aware that Mr.Brady has been calling nursing station to try and get update on patient. Mr.Brady is not on contact list and explained I could not give him any patient information. Patient upset and called Vira Agar, CNA and Secretary  "bitch". AC aware. Son to update family on patients care. Will continue to monitor.

## 2019-06-03 NOTE — Progress Notes (Signed)
Initial Nutrition Assessment  RD working remotely.  DOCUMENTATION CODES:   Not applicable  INTERVENTION:   - Continue Ensure Enlive po BID, each supplement provides 350 kcal and 20 grams of protein  - Add MVI with minerals daily  - Encourage adequate PO intake  NUTRITION DIAGNOSIS:   Increased nutrient needs related to post-op healing, chronic illness (COPD) as evidenced by estimated needs.  GOAL:   Patient will meet greater than or equal to 90% of their needs  MONITOR:   PO intake, Supplement acceptance, Labs, Weight trends, Skin  REASON FOR ASSESSMENT:   Consult Assessment of nutrition requirement/status  ASSESSMENT:   78 year old female who was admitted on 11/02 with right knee pain. Pt is s/p total knee arthroplasty on 11/02. 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   Noted pt has become more confused and weak over the course of admission. Plan is for pt to d/c to SNF.  Reviewed weight history in chart. Weight trending up from September 2019 to January 2020 but trending down sine that time. Pt with a 3.7 kg weight loss from 04/24/19 to 05/27/19. This is a 5.0% weight loss in just over 1 month which is not quite significant for timeframe.  Unable to reach pt via phone call to room. RD will continue with Ensure Enlive order that was initiated on 11/08. Will also add daily MVI.  Meal Completion: 0-100%  Medications reviewed and include: Colace, Ensure Enlive BID, ferrous sulfate, Reglan 5 mg BID, Protonix, Senna, IV abx IVF: NS @ 100 ml/hr  Labs reviewed: sodium 134, potassium 3.4  UOP: 1065 ml x 24 hours  NUTRITION - FOCUSED PHYSICAL EXAM:  Unable to complete at this time. RD working remotely.  Diet Order:   Diet Order            Diet - low sodium heart healthy        Diet regular Room service appropriate? Yes; Fluid consistency: Thin  Diet effective now              EDUCATION  NEEDS:   No education needs have been identified at this time  Skin:  Skin Assessment: Skin Integrity Issues: Skin Integrity Issues: Incisions: right leg  Last BM:  06/03/19 small type 4  Height:   Ht Readings from Last 1 Encounters:  05/27/19 5\' 2"  (1.575 m)    Weight:   Wt Readings from Last 1 Encounters:  05/27/19 70.7 kg    Ideal Body Weight:  50 kg  BMI:  Body mass index is 28.5 kg/m.  Estimated Nutritional Needs:   Kcal:  1400-1600  Protein:  75-90 grams  Fluid:  1.4-1.6 L    Gaynell Face, MS, RD, LDN Inpatient Clinical Dietitian Pager: [phone] Weekend/After Hours: [phone]

## 2019-06-03 NOTE — Plan of Care (Signed)
Continue current POC 

## 2019-06-03 NOTE — Progress Notes (Signed)
PROGRESS NOTE    [name]  TIR:[id number] DOB: September 02, 1940 DOA: 05/27/2019 PCP: Wenda Low, MD   Brief Narrative: 78 y.o. 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  Patient was supposed to be discharged on 05/28/2019.  Patient was not discharged that day because she did not meet PT goals.  Per nurses report patient has been declining over the last couple of days initially requiring 1 assist, now requiring to assist for ADLs.  Patient has been confused over the last couple of days.  On admission she was alert and oriented x3.  Over the course of hospitalization she has become confused.  She has become very weak to the point that she is not able to hold with her hand her medications.  Patient has been complaining of pain generalized, she report pain in her shoulders and her hands chest per nurse report. [facility] was consulted on 06/02/2019 to help assist with medical management, confusion, fever and chest pain.  Patient seen and evaluated.  Patient is alert, she is oriented to person and place.  Not to situation.  She does not know why she is in the hospital.  She is following commands.  She is complaining of abdominal pain.  She is also complaining of shoulders and upper extremities  Pains.  She is unable to tell me when was her last bowel movement.  Patient had a mild fever with a temperature 101.4 last night 11/7.  Labs sodium 134, potassium 3.4, BUN 22, creatinine 0.78, CRP 20, white blood cell 10, hemoglobin 9, platelets 320.  UA -0-5 white blood cell.  Chest x-ray; no active  cardiopulmonary disease.   Assessment & Plan:   Active Problems:   COPD (chronic obstructive pulmonary disease) (HCC)   Essential hypertension   Hypercholesterolemia   CKD (chronic kidney disease), stage III (HCC)   Adenocarcinoma of right lung, stage 1 (HCC)   Neuropathic pain syndrome (non-herpetic)   Chest pain   S/P total knee replacement   Acute metabolic encephalopathy   #1 acute metabolic encephalopathy likely secondary to pneumonia.  She was started on Rocephin and azithromycin and vancomycin 06/02/2019.  Will DC vancomycin MRSA PCR negative.  Continue Rocephin and azithromycin.  She is awake and answers all my questions appropriately.  She is on 2-1/2 L of oxygen saturation above 99%.  Her fever curve is trending down.  #2 hypertension uncontrolled increase Norvasc continue metoprolol and zestril  #3 right knee arthroplasty continue aspirin 325 twice a day for DVT prophylaxis per Ortho.  Ultram for pain control. COVID PENDING FOR DC  #4 depression continue Zoloft  #5 osteoporosis continue Evista and  #6 hyperlipidemia continue Lipitor  #7 severe vitamin D deficiency replete     Nutrition Problem: Increased nutrient needs Etiology: post-op healing, chronic illness(COPD)     Signs/Symptoms: estimated needs    Interventions: Ensure Enlive (each supplement provides 350kcal and 20 grams of protein), MVI  Estimated body mass index is 28.5 kg/m as calculated from the following:   Height as of this encounter: 5\' 2"  (1.575 m).   Weight as of this encounter: 70.7 kg.  DVT prophylaxis: Aspirin per Ortho  code Status: Full code Family Communication: dw son Disposition Plan: Per Ortho   Consultants:      Subjective: Patient is resting in bed awake and alert said good morning and asked me how are you denies any new complaints denies any chest pain abdominal pain nausea vomiting she is anxious to have her breakfast  Objective: Vitals:   06/02/19 2032  06/02/19 2219 06/03/19 0611 06/03/19 0851  BP:  (!) 172/74 (!) 159/76 (!) 163/84  Pulse:  92 74 78  Resp:  18 16   Temp:  98.9 F (37.2 C) 98.7 F (37.1 C)   TempSrc:  Oral Oral   SpO2: 96% 97% 99%   Weight:      Height:        Intake/Output Summary (Last 24 hours) at 06/03/2019 1039 Last data filed at 06/03/2019 0900 Gross per 24 hour  Intake 2401.6 ml  Output 865 ml  Net 1536.6 ml   Filed Weights   05/27/19 0613  Weight: 70.7 kg    Examination:  General exam: Appears calm and comfortable  Respiratory system few scattered rhonchi to auscultation. Respiratory effort normal. Cardiovascular system: S1 & S2 heard, RRR. No JVD, murmurs, rubs, gallops or clicks. No pedal edema. Gastrointestinal system: Abdomen is nondistended, soft and nontender. No organomegaly or masses felt. Normal bowel sounds heard. Central nervous system: Alert and oriented. No focal neurological deficits. Extremities: Right knee covered with dressing Skin: No rashes, lesions or ulcers Psychiatry: Judgement and insight impaired   Data Reviewed: I have personally reviewed following labs and imaging studies  CBC: Recent Labs  Lab 05/28/19 0240 05/29/19 0230 05/30/19 0249 06/02/19 0707  WBC 9.7 12.9* 9.8 10.7*  HGB 10.0* 10.5* 9.9* 10.2*  HCT 31.7* 34.1* 31.8* 31.7*  MCV 90.1 89.3 88.8 88.3  PLT 240 254 258 073   Basic Metabolic Panel: Recent Labs  Lab 05/28/19 0240 06/02/19 0707  NA 139 134*  K 3.7 3.4*  CL 106 99  CO2 24 23  GLUCOSE 97 95  BUN 16 22  CREATININE 1.16* 0.78  CALCIUM 8.3* 8.2*   GFR: Estimated Creatinine Clearance: 55.1 mL/min (by C-G formula based on SCr of 0.78 mg/dL). Liver Function Tests: Recent Labs  Lab 06/02/19 0806  AST 17  ALT 12  ALKPHOS 79  BILITOT 1.6*  PROT 6.7  ALBUMIN 2.5*   No results for input(s): LIPASE, AMYLASE in the last 168 hours. Recent Labs  Lab 06/02/19 1014  AMMONIA 18   Coagulation Profile: No results for input(s): INR,  PROTIME in the last 168 hours. Cardiac Enzymes: No results for input(s): CKTOTAL, CKMB, CKMBINDEX, TROPONINI in the last 168 hours. BNP (last 3 results) No results for input(s): PROBNP in the last 8760 hours. HbA1C: No results for input(s): HGBA1C in the last 72 hours. CBG: No results for input(s): GLUCAP in the last 168 hours. Lipid Profile: No results for input(s): CHOL, HDL, LDLCALC, TRIG, CHOLHDL, LDLDIRECT in the last 72 hours. Thyroid Function Tests: Recent Labs    06/02/19 0806  TSH 1.422   Anemia Panel: No results for input(s): VITAMINB12, FOLATE, FERRITIN, TIBC, IRON, RETICCTPCT in the last 72 hours. Sepsis Labs: No results for input(s): PROCALCITON, LATICACIDVEN in the last 168 hours.  Recent Results (from the past 240 hour(s))  SARS CORONAVIRUS 2 (TAT 6-24 HRS) Nasopharyngeal Nasopharyngeal Swab     Status: None   Collection Time: 05/25/19  5:45 PM   Specimen: Nasopharyngeal Swab  Result Value Ref Range Status   SARS Coronavirus  2 NEGATIVE NEGATIVE Final    Comment: (NOTE) SARS-CoV-2 target nucleic acids are NOT DETECTED. The SARS-CoV-2 RNA is generally detectable in upper and lower respiratory specimens during the acute phase of infection. Negative results do not preclude SARS-CoV-2 infection, do not rule out co-infections with other pathogens, and should not be used as the sole basis for treatment or other patient management decisions. Negative results must be combined with clinical observations, patient history, and epidemiological information. The expected result is Negative. Fact Sheet for Patients: SugarRoll.be Fact Sheet for Healthcare Providers: https://www.woods-.com/ This test is not yet approved or cleared by the [place] FDA and  has been authorized for detection and/or diagnosis of SARS-CoV-2 by FDA under an Emergency Use Authorization (EUA). This EUA will remain  in effect (meaning this test can  be used) for the duration of the COVID-19 declaration under Section 56 4(b)(1) of the Act, 21 U.S.C. section 360bbb-3(b)(1), unless the authorization is terminated or revoked sooner. Performed at [facility], [place] [address], [place], [place] [address]   MRSA PCR Screening     Status: None   Collection Time: 06/02/19  2:24 PM   Specimen: Nasal Mucosa; Nasopharyngeal  Result Value Ref Range Status   MRSA by PCR NEGATIVE NEGATIVE Final    Comment:        The GeneXpert MRSA Assay (FDA approved for NASAL specimens only), is one component of a comprehensive MRSA colonization surveillance program. It is not intended to diagnose MRSA infection nor to guide or monitor treatment for MRSA infections. Performed at [facility], [place] [address], [place], [place] [address]          Radiology Studies: Dg Abd 1 View  Result Date: 06/02/2019 CLINICAL DATA:  Abdominal pain/distension. EXAM: ABDOMEN - 1 VIEW COMPARISON:  08/29/2017 FINDINGS: Air and stool present throughout the colon with moderate fecal retention over the rectum. There are a few air-filled nondilated small bowel loops present. No free peritoneal air. Previous cholecystectomy. Mild degenerate change of the spine and hips. Partially visualized hardware over the left femoral neck intact. Old right pelvic fracture. Intervertebral cage at the L5-S1 level unchanged. IMPRESSION: Nonspecific, nonobstructive bowel gas pattern with moderate fecal retention over the rectum. Electronically Signed   By: Marin Olp M.D.   On: 06/02/2019 09:43   Ct Head Wo Contrast  Result Date: 06/02/2019 CLINICAL DATA:  Altered level of consciousness (LOC), unexplained. Additional history provided: Status post right knee replacement. Additional history: History of adenocarcinoma status post right upper lobectomy. EXAM: CT HEAD WITHOUT CONTRAST TECHNIQUE: Contiguous axial images were obtained from the base of the skull through the  vertex without intravenous contrast. COMPARISON:  Report from head CT 01/31/2010 (images unavailable), head CT 10/08/2008 report from brain MRI 12/01/2000 FINDINGS: Brain: The examination is motion degraded, limiting evaluation. There is a punctate focus of hyperdensity within the right periatrial white matter which likely reflects a tiny calcification. No convincing evidence of acute intracranial hemorrhage. No demarcated cortical infarction. No evidence of intracranial mass. No midline shift or extra-axial fluid collection. Extensive nearly confluent hypodensity within the bilateral cerebral white matter has progressed from prior head CT 10/08/2008, as has heterogeneity of the bilateral basal ganglia and thalami. This finding is nonspecific but may reflect advanced chronic small vessel ischemic disease. Cerebral volume is normal for age.  Partially empty sella turcica. Vascular: No hyperdense vessel.  Atherosclerotic calcifications. Skull: Normal. Negative for fracture or focal lesion. Sinuses/Orbits: Visualized orbits demonstrate no acute abnormality. Bilateral  posterior staphyloma globe deformities. No significant paranasal sinus disease or mastoid effusion at the imaged levels. IMPRESSION: 1. Significantly motion degraded examination. 2. No evidence of acute intracranial abnormality. 3. Advanced cerebral white matter disease and heterogeneity of the bilateral basal ganglia and thalami has progressed from prior head CT 10/08/2008. Findings are nonspecific but likely reflect advanced chronic small vessel ischemic disease. Consider brain MRI to exclude superimposed acute infarct, if clinically warranted. Electronically Signed   By: Kellie Simmering DO   On: 06/02/2019 11:56   Ct Angio Chest Pe W Or Wo Contrast  Result Date: 06/02/2019 CLINICAL DATA:  Patient with recent right knee replacement. Confusion. Evaluate for pulmonary embolus. EXAM: CT ANGIOGRAPHY CHEST WITH CONTRAST TECHNIQUE: Multidetector CT imaging of  the chest was performed using the standard protocol during bolus administration of intravenous contrast. Multiplanar CT image reconstructions and MIPs were obtained to evaluate the vascular anatomy. CONTRAST:  42mL OMNIPAQUE IOHEXOL 350 MG/ML SOLN COMPARISON:  Chest CT 08/02/2018 FINDINGS: Cardiovascular: Normal heart size. Thoracic aortic vascular calcifications. Adequate opacification of the pulmonary arterial system. No intraluminal filling defects identified to suggest acute pulmonary embolus. Mediastinum/Nodes: No axillary lymphadenopathy. Similar-appearing 8 mm right paratracheal lymph node (image 15; series 4). No definite hilar adenopathy. Normal appearance of the esophagus. Lungs/Pleura: Central airways are patent. Dependent atelectasis within the left lower lobe. Left apical bullous change. Centrilobular and paraseptal emphysematous change. Unchanged 5 mm left lower lobe nodule (image 98; series 10). Interval development of subpleural consolidation within the right lower lobe. Small right pleural effusion. Stable volume loss paddle with right upper lobectomy. Upper Abdomen: No acute process. Renal cysts. Small hiatal hernia. Musculoskeletal: Thoracic spine degenerative changes. No aggressive or acute appearing osseous lesions. Review of the MIP images confirms the above findings. IMPRESSION: 1. No evidence for acute pulmonary embolus. 2. Interval development of subpleural consolidation within the right lower lobe which may represent infection. Recommend follow-up chest CT in 3 months to ensure resolution. 3. Small right pleural effusion. 4. Emphysema and aortic atherosclerosis. 5. Stable postsurgical changes right hemithorax. Aortic Atherosclerosis (ICD10-I70.0) and Emphysema (ICD10-J43.9). Electronically Signed   By: Lovey Newcomer M.D.   On: 06/02/2019 12:20   Dg Chest Port 1 View  Result Date: 06/02/2019 CLINICAL DATA:  Cough. EXAM: PORTABLE CHEST 1 VIEW COMPARISON:  10/10/2018 FINDINGS: Patient  slightly rotated to the right. Stable chronic and postsurgical changes over the right lung. Left lung is clear. Cardiomediastinal silhouette and remainder of the exam is unchanged. IMPRESSION: No active disease. Electronically Signed   By: Marin Olp M.D.   On: 06/02/2019 05:32   Vas [place] Lower Extremity Venous (dvt)  Result Date: 06/02/2019  Lower Venous Study Indications: Pain, and Edema.  Limitations: Poor patient cooperation, resticted mobility, and pain. Comparison Study: No prior study. Performing Technologist: Maudry Mayhew MHA, RDMS, RVT, RDCS  Examination Guidelines: A complete evaluation includes B-mode imaging, spectral Doppler, color Doppler, and power Doppler as needed of all accessible portions of each vessel. Bilateral testing is considered an integral part of a complete examination. Limited examinations for reoccurring indications may be performed as noted.  +---------+---------------+---------+-----------+----------+--------------+ RIGHT    CompressibilityPhasicitySpontaneityPropertiesThrombus Aging +---------+---------------+---------+-----------+----------+--------------+ CFV      Full           Yes      Yes                                 +---------+---------------+---------+-----------+----------+--------------+ SFJ  Full                                                        +---------+---------------+---------+-----------+----------+--------------+ FV Prox  Full                                                        +---------+---------------+---------+-----------+----------+--------------+ FV Mid   Full                                                        +---------+---------------+---------+-----------+----------+--------------+ FV Distal                        Yes                                 +---------+---------------+---------+-----------+----------+--------------+ PFV      Full                                                         +---------+---------------+---------+-----------+----------+--------------+ PTV      Full                                                        +---------+---------------+---------+-----------+----------+--------------+ PERO     Full                                                        +---------+---------------+---------+-----------+----------+--------------+ Patient unable to tolerate distal femoral vein compression, however it is patent by color Doppler. Unable to visualize popliteal vein due to technical limitations.  +---------+---------------+---------+-----------+----------+--------------+ LEFT     CompressibilityPhasicitySpontaneityPropertiesThrombus Aging +---------+---------------+---------+-----------+----------+--------------+ CFV      Full           Yes      Yes                                 +---------+---------------+---------+-----------+----------+--------------+ SFJ      Full                                                        +---------+---------------+---------+-----------+----------+--------------+ FV Prox  Full                                                        +---------+---------------+---------+-----------+----------+--------------+  FV Mid   Full                                                        +---------+---------------+---------+-----------+----------+--------------+ FV DistalFull                                                        +---------+---------------+---------+-----------+----------+--------------+ PFV      Full                                                        +---------+---------------+---------+-----------+----------+--------------+ PTV      Full                                                        +---------+---------------+---------+-----------+----------+--------------+ Unable to visualize popliteal vein and peroneal veins due to technical limitations.  Summary: Right:  There is no evidence of deep vein thrombosis in the lower extremity. However, portions of this examination were limited- see technologist comments above. Left: There is no evidence of deep vein thrombosis in the lower extremity. However, portions of this examination were limited- see technologist comments above.  *See table(s) above for measurements and observations.    Preliminary         Scheduled Meds: . amLODipine  5 mg Oral Daily  . aspirin EC  325 mg Oral BID  . atorvastatin  20 mg Oral Daily  . docusate sodium  100 mg Oral BID  . feeding supplement (ENSURE ENLIVE)  237 mL Oral BID BM  . ferrous sulfate  325 mg Oral TID PC  . fluticasone furoate-vilanterol  1 puff Inhalation Daily  . gabapentin  400 mg Oral TID  . lisinopril  10 mg Oral q morning - 10a  . metoCLOPramide  5 mg Oral BID  . metoprolol tartrate  12.5 mg Oral BID  . mometasone-formoterol  2 puff Inhalation BID  . multivitamin with minerals  1 tablet Oral Daily  . pantoprazole  40 mg Oral Q0600  . raloxifene  60 mg Oral Daily  . senna-docusate  1 tablet Oral BID  . sertraline  100 mg Oral Daily  . umeclidinium bromide  1 puff Inhalation Daily   Continuous Infusions: . sodium chloride 100 mL/hr at 06/03/19 0600  . azithromycin Stopped (06/02/19 1851)  . cefTRIAXone (ROCEPHIN)  IV Stopped (06/02/19 1645)  . vancomycin       LOS: 6 days     Georgette Shell, MD [facility]  If 7PM-7AM, please contact night-coverage [email or web address] Password [facility] 06/03/2019, 10:39 AM

## 2019-06-03 NOTE — Care Management Important Message (Signed)
Important Message  Patient Details IM Letter given to Kathrin Greathouse SW to present to the Patient Name: [name] MRN: [id number] Date of Birth: February 15, 1941   Medicare Important Message Given:  Yes     Kerin Salen 06/03/2019, 1:30 PM

## 2019-06-04 ENCOUNTER — Inpatient Hospital Stay (HOSPITAL_COMMUNITY): Payer: Medicare Other

## 2019-06-04 ENCOUNTER — Ambulatory Visit: Payer: Medicare Other | Admitting: Thoracic Surgery (Cardiothoracic Vascular Surgery)

## 2019-06-04 DIAGNOSIS — G9341 Metabolic encephalopathy: Secondary | ICD-10-CM

## 2019-06-04 IMAGING — DX DG ABDOMEN 1V
1 series · 1 of 1 positions shown · non-contrast
Comparison: [DATE].

CLINICAL DATA: Acute generalized abdominal pain.

EXAM:
ABDOMEN - 1 VIEW

[abdomen kub]
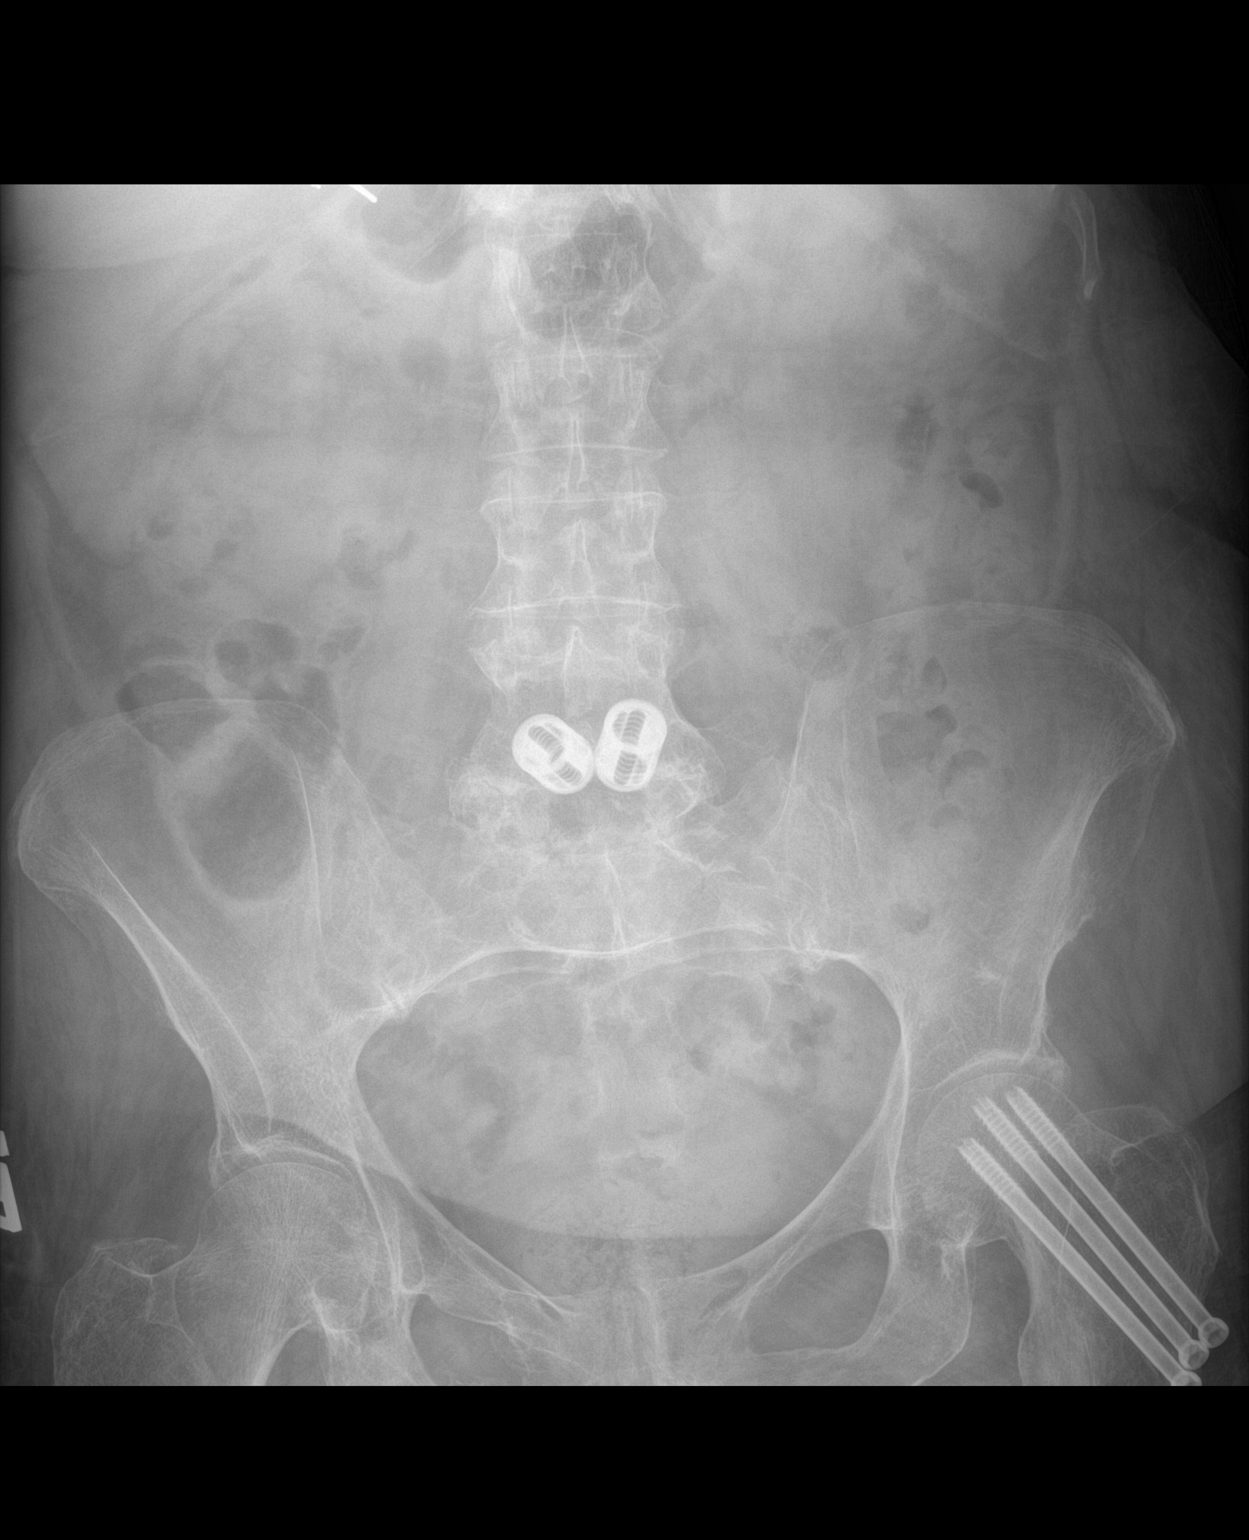

[1 of 1 positions shown; findings below may reference images not displayed]

FINDINGS: No evidence of bowel obstruction or ileus. Status post
cholecystectomy. Old fractures are seen involving the right inferior
and superior pubic rami.
IMPRESSION: No evidence of bowel obstruction or ileus.

## 2019-06-04 NOTE — Progress Notes (Signed)
SPORTS MEDICINE AND JOINT REPLACEMENT  [name], MD    Carlyon Shadow, PA-C [place], [place], [place]  [address]                             [phone]   PROGRESS NOTE  Subjective:  negative for Chest Pain  negative for Shortness of Breath  negative for Nausea/Vomiting   negative for Calf Pain  negative for Bowel Movement   Tolerating Diet: yes         Patient reports pain as 3 on 0-10 scale.    Objective: Vital signs in last 24 hours:    Patient Vitals for the past 24 hrs:  BP Temp Temp src Pulse Resp SpO2  06/04/19 0830 - - - - - 95 %  06/04/19 0431 (!) 151/64 98 F (36.7 C) Oral 66 18 99 %  06/03/19 2247 (!) 154/90 99 F (37.2 C) Oral 100 20 99 %  06/03/19 1326 (!) 158/81 98.9 F (37.2 C) - 81 16 99 %    @flow {1959:LAST@   Intake/Output from previous day:   11/09 0701 - 11/10 0700 In: 1506.7 [P.O.:540; I.V.:616.7] Out: 750 [Urine:750]   Intake/Output this shift:   11/10 0701 - 11/10 1900 In: 120 [P.O.:120] Out: 300 [Urine:300]   Intake/Output      11/09 0701 - 11/10 0700 11/10 0701 - 11/11 0700   P.O. 540 120   I.V. (mL/kg) 616.7 (8.7)    IV Piggyback 350    Total Intake(mL/kg) 1506.7 (21.3) 120 (1.7)   Urine (mL/kg/hr) 750 (0.4) 300 (0.8)   Stool 0    Total Output 750 300   Net +756.7 -180        Stool Occurrence 3 x       LABORATORY DATA: Recent Labs    05/29/19 0230 05/30/19 0249 06/02/19 0707 06/03/19 1310  WBC 12.9* 9.8 10.7* 12.6*  HGB 10.5* 9.9* 10.2* 9.7*  HCT 34.1* 31.8* 31.7* 32.5*  PLT 254 258 320 320   Recent Labs    06/02/19 0707 06/03/19 1310  NA 134* 136  K 3.4* 3.6  CL 99 104  CO2 23 22  BUN 22 22  CREATININE 0.78 0.83  GLUCOSE 95 115*  CALCIUM 8.2* 8.0*   Lab Results  Component Value Date   INR 1.00 12/09/2017   INR 0.99 05/05/2016   INR 1.38 04/08/2010    Examination:  General appearance: alert, cooperative and no distress Extremities: extremities normal, atraumatic, no cyanosis or  edema  Wound Exam: clean, dry, intact   Drainage:  None: wound tissue dry  Motor Exam: Quadriceps and Hamstrings Intact  Sensory Exam: Superficial Peroneal, Deep Peroneal and Tibial normal   Assessment:    8 Days Post-Op  Procedure(s) (LRB): TOTAL KNEE ARTHROPLASTY (Right)  ADDITIONAL DIAGNOSIS:  Active Problems:   COPD (chronic obstructive pulmonary disease) (HCC)   Essential hypertension   Hypercholesterolemia   CKD (chronic kidney disease), stage III (HCC)   Adenocarcinoma of right lung, stage 1 (HCC)   Neuropathic pain syndrome (non-herpetic)   Chest pain   S/P total knee replacement   Acute metabolic encephalopathy   Cough     Plan: Physical Therapy as ordered Weight Bearing as Tolerated (WBAT)  DVT Prophylaxis:  Aspirin  DISCHARGE PLAN: Skilled Nursing Facility/Rehab  Patient examined and all labs/imaging reviewed. She is ok to D/C to SNF from an orthopedic standpoint. Medicine following and will need to sign off  prior to D/C  Donia Ast 06/04/2019, 12:19 PM

## 2019-06-04 NOTE — TOC Progression Note (Signed)
Transition of Care [facility]) - Progression Note    Patient Details  Name: [name] MRN: [id number] Date of Birth: [birth year]  Transition of Care [facility]) CM/SW Gordon, LCSW Phone Number: 06/04/2019, 8:38 AM  Clinical Narrative:    COVID-19 test negative. No barriers to discharge.    Expected Discharge Plan: Skilled Nursing Facility Barriers to Discharge: Barriers Resolved  Expected Discharge Plan and Services Expected Discharge Plan: [place]         Expected Discharge Date: 05/28/19                                     Social Determinants of Health (SDOH) Interventions    Readmission Risk Interventions No flowsheet data found.

## 2019-06-04 NOTE — Progress Notes (Signed)
Physical Therapy Treatment Patient Details Name: [name] MRN: [id number] DOB: [birth year] Today's Date: 06/04/2019    History of Present Illness 78 yo s/p R TKA 05/27/19. PMH: VATS 2017, HTN, COPD, back pain-chronic pain, R ankle surgery, L hip surgery    PT Comments    Pt continues to require significant +2 assist for mobility. She was able to stand and pivot to the recliner again on today. She will need SNF for continued rehab.    Follow Up Recommendations  Follow surgeon's recommendation for DC plan and follow-up therapies;SNF     Equipment Recommendations  None recommended by PT    Recommendations for Other Services       Precautions / Restrictions Precautions Precautions: Fall;Knee Restrictions Weight Bearing Restrictions: No Other Position/Activity Restrictions: WBAT    Mobility  Bed Mobility Overal bed mobility: Needs Assistance Bed Mobility: Supine to Sit     Supine to sit: Mod assist;+2 for physical assistance;+2 for safety/equipment;HOB elevated     General bed mobility comments: Assist for trunk and bil LEs. Multimodal cueing for technique, encouragement. Utilized bedpad for scooting, positioning.  Transfers Overall transfer level: Needs assistance Equipment used: None Transfers: Sit to/from [facility] Sit to Stand: Mod assist;+2 physical assistance;+2 safety/equipment Stand pivot transfers: Mod assist;+2 physical assistance;+2 safety/equipment       General transfer comment: Stood with 2 person side by side assistance. Stand pivot, bed to recliner. Cues for safety, technique.  Ambulation/Gait             General Gait Details: NT-bil shoulder and L side pain affecting pt's ability to use walker on today. Deferred ambulation.   Stairs             Wheelchair Mobility    Modified Rankin (Stroke Patients Only)       Balance Overall balance assessment: Needs assistance   Sitting balance-Leahy Scale:  Poor Sitting balance - Comments: Posterior lean with cues and assist to maintain midline. Postural control: Posterior lean Standing balance support: Bilateral upper extremity supported Standing balance-Leahy Scale: Poor                              Cognition Arousal/Alertness: Awake/alert Behavior During Therapy: WFL for tasks assessed/performed Overall Cognitive Status: No family/caregiver present to determine baseline cognitive functioning                       Memory: Decreased short-term memory Following Commands: Follows one step commands with increased time Safety/Judgement: Decreased awareness of safety   Problem Solving: Requires verbal cues;Requires tactile cues        Exercises      General Comments        Pertinent Vitals/Pain Pain Assessment: Faces Faces Pain Scale: Hurts even more Pain Location: back, bil shoulders, L side Pain Descriptors / Indicators: Grimacing;Guarding;Discomfort;Aching;Sore;Moaning Pain Intervention(s): Limited activity within patient's tolerance;Monitored during session;Repositioned    Home Living                      Prior Function            PT Goals (current goals can now be found in the care plan section) Progress towards PT goals: Progressing toward goals    Frequency    7X/week      PT Plan Current plan remains appropriate    Co-evaluation  AM-PAC PT "6 Clicks" Mobility   Outcome Measure  Help needed turning from your back to your side while in a flat bed without using bedrails?: A Lot Help needed moving from lying on your back to sitting on the side of a flat bed without using bedrails?: A Lot Help needed moving to and from a bed to a chair (including a wheelchair)?: A Lot Help needed standing up from a chair using your arms (e.g., wheelchair or bedside chair)?: Total Help needed to walk in hospital room?: Total Help needed climbing 3-5 steps with a railing? :  Total 6 Click Score: 9    End of Session Equipment Utilized During Treatment: Gait belt Activity Tolerance: Patient limited by fatigue;Patient limited by pain Patient left: in chair;with call bell/phone within reach;with chair alarm set   PT Visit Diagnosis: Other abnormalities of gait and mobility (R26.89);Difficulty in walking, not elsewhere classified (R26.2);Pain;Repeated falls (R29.6);History of falling (Z91.81);Muscle weakness (generalized) (M62.81) Pain - Right/Left: Right Pain - part of body: Knee     Time: 1100-1115 PT Time Calculation (min) (ACUTE ONLY): 15 min  Charges:  $Therapeutic Activity: 8-22 mins                        Weston Anna, PT Acute Rehabilitation Services Pager: [phone] Office: [phone]

## 2019-06-04 NOTE — Progress Notes (Signed)
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 Per nurses report patient has been declining over the last couple of days initially requiring 1 assist, now requiring to assist for ADLs. Patient has been confused over the last couple of days. On admission she was alert and oriented x3. Over the course of hospitalization she has become confused. She has become very weak to the point that she is not able to hold with her hand her medications. Patient has been complaining of pain generalized, she report pain in her shoulders and her hands chest per nurse report. [facility] was consulted on 06/02/2019 to help assist with medical management, confusion, fever and chest pain.  Patient seen and evaluated. Patient is alert, she is oriented to person and place. Not to situation. She does not know why she is in the hospital. She is following commands. She is complaining of abdominal pain. She is also complaining of shoulders and upper extremitiesPains. She is unable to tell me when was her last bowel movement.  Patient had a mild fever with a temperature 101.4 last night 11/7.Labs sodium 134, potassium 3.4, BUN 22, creatinine 0.78, CRP 20, white blood cell 10, hemoglobin 9, platelets 320.UA -0-5 white blood cell. Chest x-ray;no active  cardiopulmonary disease.  Assessment & Plan:   Active Problems:   COPD (chronic obstructive pulmonary disease) (HCC)   Essential hypertension   Hypercholesterolemia   CKD (chronic kidney disease), stage III (HCC)   Adenocarcinoma of right lung, stage 1 (HCC)   Neuropathic pain syndrome (non-herpetic)   Chest pain   S/P total knee replacement   Acute metabolic encephalopathy   Cough   #1 acute metabolic encephalopathy likely secondary to pneumonia.  She was started on Rocephin and azithromycin and vancomycin 06/02/2019.  Will DC vancomycin MRSA PCR negative.  Continue Rocephin and azithromycin.  She is awake and answers all my questions appropriately.    She is afebrile this morning with a T-max of 99.  She is saturating 95% on 2 L of oxygen.  CT of the chest showed right lower lobe consolidation.  Small right pleural effusion.  She still has some periods of confusion but improved.  She thinks this is a hospital but she does not know why she came here for.  CT of the head 06/02/2019 shows advanced chronic small vessel ischemic disease.    #2 hypertension uncontrolled continue Norvasc metoprolol Zestril the dose of Norvasc was increased 06/03/2019.  Increase Norvasc continue metoprolol and zestril.  Blood pressure 151/64 prior to morning medications.  #3 right knee arthroplasty continue aspirin 325 twice a day for DVT prophylaxis per Ortho.  Ultram for pain control. COVID - 06/03/2019.  #4 depression continue Zoloft  #5 osteoporosis continue Evista and  #6 hyperlipidemia continue Lipitor  #7 severe vitamin D deficiency replete       Nutrition Problem: Increased nutrient needs Etiology: post-op healing, chronic illness(COPD)  Signs/Symptoms: estimated needs    Interventions: Ensure Enlive (each supplement provides 350kcal and 20 grams of protein), MVI  Estimated body mass index is 28.5 kg/m as calculated from the following:   Height as of this encounter: 5\' 2"   (1.575 m).   Weight as of this encounter: 70.7 kg.  DVT prophylaxis: Aspirin per Ortho  code Status: Full code Family Communication: dw son Disposition Plan: Per Ortho    Subjective: Complaining of left upper quadrant left-sided abdominal pain started 2 days review no nausea vomiting diarrhea or constipation. KUB today shows no evidence of bowel obstruction or ileus. Objective: Vitals:   06/03/19 1326 06/03/19 2247 06/04/19 0431 06/04/19 0830  BP: (!) 158/81 (!) 154/90 (!) 151/64   Pulse: 81 100 66   Resp: 16 20 18    Temp: 98.9 F (37.2 C) 99 F (37.2 C) 98 F (36.7 C)   TempSrc:  Oral Oral   SpO2: 99% 99% 99% 95%  Weight:      Height:        Intake/Output Summary (Last 24 hours) at 06/04/2019 1031 Last data filed at 06/04/2019 1007 Gross per 24 hour  Intake 986.67 ml  Output 600 ml  Net 386.67 ml   Filed Weights   05/27/19 0613  Weight: 70.7 kg    Examination:  General exam: Appears calm and comfortable  Respiratory system: scattered rhonchi right  to auscultation. Respiratory effort normal. Cardiovascular system: S1 & S2 heard, RRR. No JVD, murmurs, rubs, gallops or clicks. No pedal edema. Gastrointestinal system: Abdomen is nondistended, soft and nontender. No organomegaly or masses felt. Normal bowel sounds heard. Central nervous system: Alert and oriented. No focal neurological deficits. Extremities: Right knee covered with dressings 1+ right lower extremity edema  skin: No rashes, lesions or ulcers Psychiatry: Judgement and insight appear normal. Mood & affect appropriate.     Data Reviewed: I have personally reviewed following labs and imaging studies  CBC: Recent Labs  Lab 05/29/19 0230 05/30/19 0249 06/02/19 0707 06/03/19 1310  WBC 12.9* 9.8 10.7* 12.6*  HGB 10.5* 9.9* 10.2* 9.7*  HCT 34.1* 31.8* 31.7* 32.5*  MCV 89.3 88.8 88.3 92.1  PLT 254 258 320 342   Basic Metabolic Panel: Recent Labs  Lab 06/02/19 0707 06/03/19 1310  NA 134*  136  K 3.4* 3.6  CL 99 104  CO2 23 22  GLUCOSE 95 115*  BUN 22 22  CREATININE 0.78 0.83  CALCIUM 8.2* 8.0*   GFR: Estimated Creatinine Clearance: 53.1 mL/min (by C-G formula based on SCr of 0.83 mg/dL). Liver Function Tests: Recent Labs  Lab 06/02/19 0806  AST 17  ALT 12  ALKPHOS 79  BILITOT 1.6*  PROT 6.7  ALBUMIN 2.5*   No results for input(s): LIPASE, AMYLASE in the last 168 hours. Recent Labs  Lab 06/02/19 1014  AMMONIA 18   Coagulation Profile: No results for input(s): INR, PROTIME in the last 168 hours. Cardiac Enzymes: No results for input(s): CKTOTAL, CKMB, CKMBINDEX, TROPONINI in the last 168 hours. BNP (last 3 results) No results for input(s): PROBNP in the last 8760 hours. HbA1C: No results for input(s): HGBA1C in the last 72 hours. CBG: No results for input(s): GLUCAP in the last 168 hours. Lipid Profile: No results for input(s): CHOL, HDL, LDLCALC, TRIG, CHOLHDL, LDLDIRECT in the last 72 hours. Thyroid Function Tests: Recent Labs    06/02/19 0806  TSH 1.422   Anemia Panel: No results for input(s): VITAMINB12, FOLATE, FERRITIN, TIBC, IRON, RETICCTPCT in the last  72 hours. Sepsis Labs: No results for input(s): PROCALCITON, LATICACIDVEN in the last 168 hours.  Recent Results (from the past 240 hour(s))  SARS CORONAVIRUS 2 (TAT 6-24 HRS) Nasopharyngeal Nasopharyngeal Swab     Status: None   Collection Time: 05/25/19  5:45 PM   Specimen: Nasopharyngeal Swab  Result Value Ref Range Status   SARS Coronavirus 2 NEGATIVE NEGATIVE Final    Comment: (NOTE) SARS-CoV-2 target nucleic acids are NOT DETECTED. The SARS-CoV-2 RNA is generally detectable in upper and lower respiratory specimens during the acute phase of infection. Negative results do not preclude SARS-CoV-2 infection, do not rule out co-infections with other pathogens, and should not be used as the sole basis for treatment or other patient management decisions. Negative results must be  combined with clinical observations, patient history, and epidemiological information. The expected result is Negative. Fact Sheet for Patients: SugarRoll.be Fact Sheet for Healthcare Providers: https://www.woods-.com/ This test is not yet approved or cleared by the [place] FDA and  has been authorized for detection and/or diagnosis of SARS-CoV-2 by FDA under an Emergency Use Authorization (EUA). This EUA will remain  in effect (meaning this test can be used) for the duration of the COVID-19 declaration under Section 56 4(b)(1) of the Act, 21 U.S.C. section 360bbb-3(b)(1), unless the authorization is terminated or revoked sooner. Performed at [facility], [place] [address] [place], [place] [address]   MRSA PCR Screening     Status: None   Collection Time: 06/02/19  2:24 PM   Specimen: Nasal Mucosa; Nasopharyngeal  Result Value Ref Range Status   MRSA by PCR NEGATIVE NEGATIVE Final    Comment:        The GeneXpert MRSA Assay (FDA approved for NASAL specimens only), is one component of a comprehensive MRSA colonization surveillance program. It is not intended to diagnose MRSA infection nor to guide or monitor treatment for MRSA infections. Performed at [facility], [place] [address], [place], [place] [address]   SARS CORONAVIRUS 2 (TAT 6-24 HRS) Nasopharyngeal Nasopharyngeal Swab     Status: None   Collection Time: 06/03/19  1:30 PM   Specimen: Nasopharyngeal Swab  Result Value Ref Range Status   SARS Coronavirus 2 NEGATIVE NEGATIVE Final    Comment: (NOTE) SARS-CoV-2 target nucleic acids are NOT DETECTED. The SARS-CoV-2 RNA is generally detectable in upper and lower respiratory specimens during the acute phase of infection. Negative results do not preclude SARS-CoV-2 infection, do not rule out co-infections with other pathogens, and should not be used as the sole basis for treatment or other  patient management decisions. Negative results must be combined with clinical observations, patient history, and epidemiological information. The expected result is Negative. Fact Sheet for Patients: SugarRoll.be Fact Sheet for Healthcare Providers: https://www.woods-.com/ This test is not yet approved or cleared by the [place] FDA and  has been authorized for detection and/or diagnosis of SARS-CoV-2 by FDA under an Emergency Use Authorization (EUA). This EUA will remain  in effect (meaning this test can be used) for the duration of the COVID-19 declaration under Section 56 4(b)(1) of the Act, 21 U.S.C. section 360bbb-3(b)(1), unless the authorization is terminated or revoked sooner. Performed at [facility], [place] [address], [place], [place] [address]          Radiology Studies: Dg Abd 1 View  Result Date: 06/04/2019 CLINICAL DATA:  Acute generalized abdominal pain. EXAM: ABDOMEN - 1 VIEW COMPARISON:  June 02, 2019. FINDINGS: No evidence of  bowel obstruction or ileus. Status post cholecystectomy. Old fractures are seen involving the right inferior and superior pubic rami. IMPRESSION: No evidence of bowel obstruction or ileus. Electronically Signed   By: Marijo Conception M.D.   On: 06/04/2019 09:53   Ct Head Wo Contrast  Result Date: 06/02/2019 CLINICAL DATA:  Altered level of consciousness (LOC), unexplained. Additional history provided: Status post right knee replacement. Additional history: History of adenocarcinoma status post right upper lobectomy. EXAM: CT HEAD WITHOUT CONTRAST TECHNIQUE: Contiguous axial images were obtained from the base of the skull through the vertex without intravenous contrast. COMPARISON:  Report from head CT 01/31/2010 (images unavailable), head CT 10/08/2008 report from brain MRI 12/01/2000 FINDINGS: Brain: The examination is motion degraded, limiting evaluation. There is a punctate focus of  hyperdensity within the right periatrial white matter which likely reflects a tiny calcification. No convincing evidence of acute intracranial hemorrhage. No demarcated cortical infarction. No evidence of intracranial mass. No midline shift or extra-axial fluid collection. Extensive nearly confluent hypodensity within the bilateral cerebral white matter has progressed from prior head CT 10/08/2008, as has heterogeneity of the bilateral basal ganglia and thalami. This finding is nonspecific but may reflect advanced chronic small vessel ischemic disease. Cerebral volume is normal for age.  Partially empty sella turcica. Vascular: No hyperdense vessel.  Atherosclerotic calcifications. Skull: Normal. Negative for fracture or focal lesion. Sinuses/Orbits: Visualized orbits demonstrate no acute abnormality. Bilateral posterior staphyloma globe deformities. No significant paranasal sinus disease or mastoid effusion at the imaged levels. IMPRESSION: 1. Significantly motion degraded examination. 2. No evidence of acute intracranial abnormality. 3. Advanced cerebral white matter disease and heterogeneity of the bilateral basal ganglia and thalami has progressed from prior head CT 10/08/2008. Findings are nonspecific but likely reflect advanced chronic small vessel ischemic disease. Consider brain MRI to exclude superimposed acute infarct, if clinically warranted. Electronically Signed   By: Kellie Simmering DO   On: 06/02/2019 11:56   Ct Angio Chest Pe W Or Wo Contrast  Result Date: 06/02/2019 CLINICAL DATA:  Patient with recent right knee replacement. Confusion. Evaluate for pulmonary embolus. EXAM: CT ANGIOGRAPHY CHEST WITH CONTRAST TECHNIQUE: Multidetector CT imaging of the chest was performed using the standard protocol during bolus administration of intravenous contrast. Multiplanar CT image reconstructions and MIPs were obtained to evaluate the vascular anatomy. CONTRAST:  23mL OMNIPAQUE IOHEXOL 350 MG/ML SOLN  COMPARISON:  Chest CT 08/02/2018 FINDINGS: Cardiovascular: Normal heart size. Thoracic aortic vascular calcifications. Adequate opacification of the pulmonary arterial system. No intraluminal filling defects identified to suggest acute pulmonary embolus. Mediastinum/Nodes: No axillary lymphadenopathy. Similar-appearing 8 mm right paratracheal lymph node (image 15; series 4). No definite hilar adenopathy. Normal appearance of the esophagus. Lungs/Pleura: Central airways are patent. Dependent atelectasis within the left lower lobe. Left apical bullous change. Centrilobular and paraseptal emphysematous change. Unchanged 5 mm left lower lobe nodule (image 98; series 10). Interval development of subpleural consolidation within the right lower lobe. Small right pleural effusion. Stable volume loss paddle with right upper lobectomy. Upper Abdomen: No acute process. Renal cysts. Small hiatal hernia. Musculoskeletal: Thoracic spine degenerative changes. No aggressive or acute appearing osseous lesions. Review of the MIP images confirms the above findings. IMPRESSION: 1. No evidence for acute pulmonary embolus. 2. Interval development of subpleural consolidation within the right lower lobe which may represent infection. Recommend follow-up chest CT in 3 months to ensure resolution. 3. Small right pleural effusion. 4. Emphysema and aortic atherosclerosis. 5. Stable postsurgical changes right hemithorax. Aortic Atherosclerosis (ICD10-I70.0)  and Emphysema (ICD10-J43.9). Electronically Signed   By: Lovey Newcomer M.D.   On: 06/02/2019 12:20   Mr Brain Wo Contrast  Result Date: 06/03/2019 CLINICAL DATA:  Encephalopathy EXAM: MRI HEAD WITHOUT CONTRAST TECHNIQUE: Multiplanar, multiecho pulse sequences of the brain and surrounding structures were obtained without intravenous contrast. COMPARISON:  Head CT 06/02/2019 FINDINGS: The examination had to be discontinued prior to completion due to patient altered mental status and  inability to comply with the technologist's instructions. Five series are provided. There is no large acute infarct or extra-axial collection. There is generalized volume loss, greater than expected for age. There is diffuse confluent white matter hyperintensity consistent with chronic small vessel ischemia. No midline shift or other mass effect. The major flow voids are normal. Paranasal sinuses are clear. There are bilateral lens replacements. IMPRESSION: 1. Truncated examination due to patient altered mental status and inability to comply with the technologist's instructions. 2. Within that limitation, no acute ischemia or other acute intracranial abnormality. 3. Age advanced volume loss and chronic small vessel ischemia. Electronically Signed   By: Ulyses Jarred M.D.   On: 06/03/2019 20:22        Scheduled Meds: . amLODipine  10 mg Oral Daily  . aspirin EC  325 mg Oral BID  . atorvastatin  20 mg Oral Daily  . cholecalciferol  1,000 Units Oral Daily  . docusate sodium  100 mg Oral BID  . feeding supplement (ENSURE ENLIVE)  237 mL Oral BID BM  . ferrous sulfate  325 mg Oral TID PC  . fluticasone furoate-vilanterol  1 puff Inhalation Daily  . gabapentin  400 mg Oral TID  . lisinopril  10 mg Oral q morning - 10a  . metoCLOPramide  5 mg Oral BID  . metoprolol tartrate  12.5 mg Oral BID  . mometasone-formoterol  2 puff Inhalation BID  . multivitamin with minerals  1 tablet Oral Daily  . pantoprazole  40 mg Oral Q0600  . raloxifene  60 mg Oral Daily  . senna-docusate  1 tablet Oral BID  . sertraline  100 mg Oral Daily  . umeclidinium bromide  1 puff Inhalation Daily  . Vitamin D (Ergocalciferol)  50,000 Units Oral Q7 days   Continuous Infusions: . sodium chloride 100 mL/hr at 06/03/19 0600  . azithromycin 500 mg (06/03/19 1954)  . cefTRIAXone (ROCEPHIN)  IV 2 g (06/03/19 2005)     LOS: 7 days     Georgette Shell, MD [facility]   If 7PM-7AM, please contact  night-coverage [email or web address] Password [email or web address] 06/04/2019, 10:31 AM

## 2019-06-04 NOTE — Plan of Care (Signed)
Continue current POC 

## 2019-06-05 MED ORDER — AMOXICILLIN-POT CLAVULANATE 875-125 MG PO TABS: 1.0000 | ORAL_TABLET | Freq: Two times a day (BID) | ORAL | 0 refills | Status: AC

## 2019-06-05 MED ORDER — DIAZEPAM 5 MG PO TABS: 2.5000 mg | ORAL_TABLET | Freq: Three times a day (TID) | ORAL | 0 refills | Status: DC | PRN

## 2019-06-05 MED ORDER — AMOXICILLIN-POT CLAVULANATE 875-125 MG PO TABS
1.0000 | ORAL_TABLET | Freq: Two times a day (BID) | ORAL | Status: DC
  Administered 2019-06-05: 11:00:00 1 via ORAL
  Filled 2019-06-05: qty 1

## 2019-06-05 MED ORDER — AZITHROMYCIN 250 MG PO TABS
500.0000 mg | ORAL_TABLET | Freq: Every day | ORAL | Status: DC
  Administered 2019-06-05: 500 mg via ORAL
  Filled 2019-06-05: qty 2

## 2019-06-05 MED ORDER — TRAMADOL HCL 50 MG PO TABS: 50.0000 mg | ORAL_TABLET | Freq: Three times a day (TID) | ORAL | 0 refills | Status: DC | PRN

## 2019-06-05 NOTE — Discharge Summary (Signed)
SPORTS MEDICINE & JOINT REPLACEMENT   Felicia Mulch, MD   Felicia Shadow, PA-C [place], [place], [place]  [address]                             [phone]  PATIENT ID: [name]        MRN:  [id number]          DOB/AGE: [birth year] / 78 y.o.    DISCHARGE SUMMARY  ADMISSION DATE:    05/27/2019 DISCHARGE DATE:   06/05/2019   ADMISSION DIAGNOSIS: Primary Osteoarthritis Right Knee    DISCHARGE DIAGNOSIS:  Primary Osteoarthritis Right Knee    ADDITIONAL DIAGNOSIS: Active Problems:   COPD (chronic obstructive pulmonary disease) (HCC)   Essential hypertension   Hypercholesterolemia   CKD (chronic kidney disease), stage III (HCC)   Adenocarcinoma of right lung, stage 1 (HCC)   Neuropathic pain syndrome (non-herpetic)   Chest pain   S/P total knee replacement   Acute metabolic encephalopathy   Cough  Past Medical History:  Diagnosis Date  . Adenocarcinoma of right lung, stage 1 ([facility]) 07/28/2016  . Anxiety   . Asthma   . Cancer ([place])    uterine  . COPD (chronic obstructive pulmonary disease) ([place])   . Coronary artery disease    mild nonobstructive by 2019 cath  . Cyst    left side of neck  . Depression   . GERD (gastroesophageal reflux disease)   . Hyperlipidemia   . Hypertension   . Low back pain   . Osteoarthritis   . Osteoporosis   . Takotsubo cardiomyopathy    EF recovered to 50-55% by 01/23/18 echo    PROCEDURE: Procedure(s): TOTAL KNEE ARTHROPLASTY on 05/27/2019  CONSULTS: Treatment Team:  Fatima Blank, MD   HISTORY:  See H&P in chart  HOSPITAL COURSE:  [name] is a 78 y.o. admitted on 05/27/2019 and found to have a diagnosis of Primary Osteoarthritis Right Knee.  After appropriate laboratory studies were obtained  they were taken to the operating room on 05/27/2019 and underwent Procedure(s): TOTAL KNEE ARTHROPLASTY.   They were given perioperative antibiotics:  Anti-infectives (From admission, onward)   Start     Dose/Rate Route  Frequency Ordered Stop   06/05/19 1000  amoxicillin-clavulanate (AUGMENTIN) 875-125 MG per tablet 1 tablet     1 tablet Oral Every 12 hours 06/05/19 0842 06/07/19 0959   06/05/19 1000  azithromycin (ZITHROMAX) tablet 500 mg     500 mg Oral Daily 06/05/19 0842 06/07/19 0959   06/05/19 0000  amoxicillin-clavulanate (AUGMENTIN) 875-125 MG tablet     1 tablet Oral 2 times daily 06/05/19 1003 06/09/19 2359   06/03/19 1800  vancomycin (VANCOCIN) IVPB 1000 mg/200 mL premix  Status:  Discontinued     1,000 mg 200 mL/hr over 60 Minutes Intravenous Every 24 hours 06/02/19 1601 06/03/19 1106   06/03/19 1800  azithromycin (ZITHROMAX) 500 mg in sodium chloride 0.9 % 250 mL IVPB  Status:  Discontinued     500 mg 250 mL/hr over 60 Minutes Intravenous Every 24 hours 06/03/19 1108 06/05/19 0842   06/02/19 1800  vancomycin (VANCOCIN) 1,500 mg in sodium chloride 0.9 % 500 mL IVPB     1,500 mg 250 mL/hr over 120 Minutes Intravenous  Once 06/02/19 1601 06/02/19 2111   06/02/19 1600  cefTRIAXone (ROCEPHIN) 2 g in sodium chloride 0.9 % 100 mL IVPB  Status:  Discontinued  2 g 200 mL/hr over 30 Minutes Intravenous Every 24 hours 06/02/19 1424 06/05/19 0842   06/02/19 1600  azithromycin (ZITHROMAX) 500 mg in sodium chloride 0.9 % 250 mL IVPB  Status:  Discontinued     500 mg 250 mL/hr over 60 Minutes Intravenous Every 24 hours 06/02/19 1424 06/03/19 1055   05/27/19 1400  ceFAZolin (ANCEF) IVPB 2g/100 mL premix     2 g 200 mL/hr over 30 Minutes Intravenous Every 6 hours 05/27/19 1157 05/27/19 2200   05/27/19 0600  vancomycin (VANCOCIN) IVPB 1000 mg/200 mL premix     1,000 mg 200 mL/hr over 60 Minutes Intravenous On call to O.R. 05/27/19 4128 05/27/19 0833   05/27/19 0600  ceFAZolin (ANCEF) IVPB 2g/100 mL premix  Status:  Discontinued     2 g 200 mL/hr over 30 Minutes Intravenous On call to O.R. 05/27/19 0522 05/27/19 1156    .  Patient given tranexamic acid IV or topical and exparel  intra-operatively.  Tolerated the procedure well.    POD# 1: Vital signs were stable.  Patient denied Chest pain, shortness of breath, or calf pain.  Patient was started on Aspirin twice daily at 8am.  Consults to PT, OT, and care management were made.  The patient was weight bearing as tolerated.  CPM was placed on the operative leg 0-90 degrees for 6-8 hours a day. When out of the CPM, patient was placed in the foam block to achieve full extension. Incentive spirometry was taught.  Dressing was changed.       POD #2, Continued  PT for ambulation and exercise program.  IV saline locked.  O2 discontinued.    The remainder of the hospital course was dedicated to ambulation and strengthening.   The patient was discharged on 9 Days Post-Op in  Good condition.  Blood products given:none  DIAGNOSTIC STUDIES: Recent vital signs:  Patient Vitals for the past 24 hrs:  BP Temp Temp src Pulse Resp SpO2  06/05/19 1319 (!) 174/66 98.1 F (36.7 C) Oral 87 16 97 %  06/05/19 1026 (!) 176/75 98 F (36.7 C) Oral 87 19 98 %  06/05/19 0906 - - - - - 90 %  06/05/19 0902 - - - - - 90 %  06/05/19 0618 (!) 184/82 99.1 F (37.3 C) Oral 73 16 94 %  06/04/19 2231 (!) 156/75 99.8 F (37.7 C) Oral 86 16 97 %  06/04/19 2159 - - - - - 95 %  06/04/19 1428 (!) 158/82 98.8 F (37.1 C) Oral 73 16 97 %       Recent laboratory studies: Recent Labs    05/30/19 0249 06/02/19 0707 06/03/19 1310  WBC 9.8 10.7* 12.6*  HGB 9.9* 10.2* 9.7*  HCT 31.8* 31.7* 32.5*  PLT 258 320 320   Recent Labs    06/02/19 0707 06/03/19 1310  NA 134* 136  K 3.4* 3.6  CL 99 104  CO2 23 22  BUN 22 22  CREATININE 0.78 0.83  GLUCOSE 95 115*  CALCIUM 8.2* 8.0*   Lab Results  Component Value Date   INR 1.00 12/09/2017   INR 0.99 05/05/2016   INR 1.38 04/08/2010     Recent Radiographic Studies :  Dg Abd 1 View  Result Date: 06/04/2019 CLINICAL DATA:  Acute generalized abdominal pain. EXAM: ABDOMEN - 1 VIEW  COMPARISON:  June 02, 2019. FINDINGS: No evidence of bowel obstruction or ileus. Status post cholecystectomy. Old fractures are seen involving the right inferior and superior  pubic rami. IMPRESSION: No evidence of bowel obstruction or ileus. Electronically Signed   By: Marijo Conception M.D.   On: 06/04/2019 09:53   Dg Abd 1 View  Result Date: 06/02/2019 CLINICAL DATA:  Abdominal pain/distension. EXAM: ABDOMEN - 1 VIEW COMPARISON:  08/29/2017 FINDINGS: Air and stool present throughout the colon with moderate fecal retention over the rectum. There are a few air-filled nondilated small bowel loops present. No free peritoneal air. Previous cholecystectomy. Mild degenerate change of the spine and hips. Partially visualized hardware over the left femoral neck intact. Old right pelvic fracture. Intervertebral cage at the L5-S1 level unchanged. IMPRESSION: Nonspecific, nonobstructive bowel gas pattern with moderate fecal retention over the rectum. Electronically Signed   By: Marin Olp M.D.   On: 06/02/2019 09:43   Ct Head Wo Contrast  Result Date: 06/02/2019 CLINICAL DATA:  Altered level of consciousness (LOC), unexplained. Additional history provided: Status post right knee replacement. Additional history: History of adenocarcinoma status post right upper lobectomy. EXAM: CT HEAD WITHOUT CONTRAST TECHNIQUE: Contiguous axial images were obtained from the base of the skull through the vertex without intravenous contrast. COMPARISON:  Report from head CT 01/31/2010 (images unavailable), head CT 10/08/2008 report from brain MRI 12/01/2000 FINDINGS: Brain: The examination is motion degraded, limiting evaluation. There is a punctate focus of hyperdensity within the right periatrial white matter which likely reflects a tiny calcification. No convincing evidence of acute intracranial hemorrhage. No demarcated cortical infarction. No evidence of intracranial mass. No midline shift or extra-axial fluid collection.  Extensive nearly confluent hypodensity within the bilateral cerebral white matter has progressed from prior head CT 10/08/2008, as has heterogeneity of the bilateral basal ganglia and thalami. This finding is nonspecific but may reflect advanced chronic small vessel ischemic disease. Cerebral volume is normal for age.  Partially empty sella turcica. Vascular: No hyperdense vessel.  Atherosclerotic calcifications. Skull: Normal. Negative for fracture or focal lesion. Sinuses/Orbits: Visualized orbits demonstrate no acute abnormality. Bilateral posterior staphyloma globe deformities. No significant paranasal sinus disease or mastoid effusion at the imaged levels. IMPRESSION: 1. Significantly motion degraded examination. 2. No evidence of acute intracranial abnormality. 3. Advanced cerebral white matter disease and heterogeneity of the bilateral basal ganglia and thalami has progressed from prior head CT 10/08/2008. Findings are nonspecific but likely reflect advanced chronic small vessel ischemic disease. Consider brain MRI to exclude superimposed acute infarct, if clinically warranted. Electronically Signed   By: Kellie Simmering DO   On: 06/02/2019 11:56   Ct Angio Chest Pe W Or Wo Contrast  Result Date: 06/02/2019 CLINICAL DATA:  Patient with recent right knee replacement. Confusion. Evaluate for pulmonary embolus. EXAM: CT ANGIOGRAPHY CHEST WITH CONTRAST TECHNIQUE: Multidetector CT imaging of the chest was performed using the standard protocol during bolus administration of intravenous contrast. Multiplanar CT image reconstructions and MIPs were obtained to evaluate the vascular anatomy. CONTRAST:  3mL OMNIPAQUE IOHEXOL 350 MG/ML SOLN COMPARISON:  Chest CT 08/02/2018 FINDINGS: Cardiovascular: Normal heart size. Thoracic aortic vascular calcifications. Adequate opacification of the pulmonary arterial system. No intraluminal filling defects identified to suggest acute pulmonary embolus. Mediastinum/Nodes: No  axillary lymphadenopathy. Similar-appearing 8 mm right paratracheal lymph node (image 15; series 4). No definite hilar adenopathy. Normal appearance of the esophagus. Lungs/Pleura: Central airways are patent. Dependent atelectasis within the left lower lobe. Left apical bullous change. Centrilobular and paraseptal emphysematous change. Unchanged 5 mm left lower lobe nodule (image 98; series 10). Interval development of subpleural consolidation within the right lower lobe. Small right pleural  effusion. Stable volume loss paddle with right upper lobectomy. Upper Abdomen: No acute process. Renal cysts. Small hiatal hernia. Musculoskeletal: Thoracic spine degenerative changes. No aggressive or acute appearing osseous lesions. Review of the MIP images confirms the above findings. IMPRESSION: 1. No evidence for acute pulmonary embolus. 2. Interval development of subpleural consolidation within the right lower lobe which may represent infection. Recommend follow-up chest CT in 3 months to ensure resolution. 3. Small right pleural effusion. 4. Emphysema and aortic atherosclerosis. 5. Stable postsurgical changes right hemithorax. Aortic Atherosclerosis (ICD10-I70.0) and Emphysema (ICD10-J43.9). Electronically Signed   By: Lovey Newcomer M.D.   On: 06/02/2019 12:20   Mr Brain Wo Contrast  Result Date: 06/03/2019 CLINICAL DATA:  Encephalopathy EXAM: MRI HEAD WITHOUT CONTRAST TECHNIQUE: Multiplanar, multiecho pulse sequences of the brain and surrounding structures were obtained without intravenous contrast. COMPARISON:  Head CT 06/02/2019 FINDINGS: The examination had to be discontinued prior to completion due to patient altered mental status and inability to comply with the technologist's instructions. Five series are provided. There is no large acute infarct or extra-axial collection. There is generalized volume loss, greater than expected for age. There is diffuse confluent white matter hyperintensity consistent with  chronic small vessel ischemia. No midline shift or other mass effect. The major flow voids are normal. Paranasal sinuses are clear. There are bilateral lens replacements. IMPRESSION: 1. Truncated examination due to patient altered mental status and inability to comply with the technologist's instructions. 2. Within that limitation, no acute ischemia or other acute intracranial abnormality. 3. Age advanced volume loss and chronic small vessel ischemia. Electronically Signed   By: Ulyses Jarred M.D.   On: 06/03/2019 20:22   Dg Chest Port 1 View  Result Date: 06/02/2019 CLINICAL DATA:  Cough. EXAM: PORTABLE CHEST 1 VIEW COMPARISON:  10/10/2018 FINDINGS: Patient slightly rotated to the right. Stable chronic and postsurgical changes over the right lung. Left lung is clear. Cardiomediastinal silhouette and remainder of the exam is unchanged. IMPRESSION: No active disease. Electronically Signed   By: Marin Olp M.D.   On: 06/02/2019 05:32   Vas [place] Lower Extremity Venous (dvt)  Result Date: 06/04/2019  Lower Venous Study Indications: Pain, and Edema.  Limitations: Poor patient cooperation, resticted mobility, and pain. Comparison Study: No prior study. Performing Technologist: Maudry Mayhew MHA, RDMS, RVT, RDCS  Examination Guidelines: A complete evaluation includes B-mode imaging, spectral Doppler, color Doppler, and power Doppler as needed of all accessible portions of each vessel. Bilateral testing is considered an integral part of a complete examination. Limited examinations for reoccurring indications may be performed as noted.  +---------+---------------+---------+-----------+----------+--------------+ RIGHT    CompressibilityPhasicitySpontaneityPropertiesThrombus Aging +---------+---------------+---------+-----------+----------+--------------+ CFV      Full           Yes      Yes                                 +---------+---------------+---------+-----------+----------+--------------+  SFJ      Full                                                        +---------+---------------+---------+-----------+----------+--------------+ FV Prox  Full                                                        +---------+---------------+---------+-----------+----------+--------------+  FV Mid   Full                                                        +---------+---------------+---------+-----------+----------+--------------+ FV Distal                        Yes                                 +---------+---------------+---------+-----------+----------+--------------+ PFV      Full                                                        +---------+---------------+---------+-----------+----------+--------------+ PTV      Full                                                        +---------+---------------+---------+-----------+----------+--------------+ PERO     Full                                                        +---------+---------------+---------+-----------+----------+--------------+ Patient unable to tolerate distal femoral vein compression, however it is patent by color Doppler. Unable to visualize popliteal vein due to technical limitations.  +---------+---------------+---------+-----------+----------+--------------+ LEFT     CompressibilityPhasicitySpontaneityPropertiesThrombus Aging +---------+---------------+---------+-----------+----------+--------------+ CFV      Full           Yes      Yes                                 +---------+---------------+---------+-----------+----------+--------------+ SFJ      Full                                                        +---------+---------------+---------+-----------+----------+--------------+ FV Prox  Full                                                        +---------+---------------+---------+-----------+----------+--------------+ FV Mid   Full                                                         +---------+---------------+---------+-----------+----------+--------------+ FV DistalFull                                                        +---------+---------------+---------+-----------+----------+--------------+  PFV      Full                                                        +---------+---------------+---------+-----------+----------+--------------+ PTV      Full                                                        +---------+---------------+---------+-----------+----------+--------------+ Unable to visualize popliteal vein and peroneal veins due to technical limitations.  Summary: Right: There is no evidence of deep vein thrombosis in the lower extremity. However, portions of this examination were limited- see technologist comments above. Left: There is no evidence of deep vein thrombosis in the lower extremity. However, portions of this examination were limited- see technologist comments above.  *See table(s) above for measurements and observations. Electronically signed by Harold Barban MD on 06/04/2019 at 5:57:41 PM.    Final     DISCHARGE INSTRUCTIONS: Discharge Instructions    Call MD / Call 911   Complete by: As directed    If you experience chest pain or shortness of breath, CALL 911 and be transported to the hospital emergency room.  If you develope a fever above 101 F, pus (white drainage) or increased drainage or redness at the wound, or calf pain, call your surgeon's office.   Call MD / Call 911   Complete by: As directed    If you experience chest pain or shortness of breath, CALL 911 and be transported to the hospital emergency room.  If you develope a fever above 101 F, pus (white drainage) or increased drainage or redness at the wound, or calf pain, call your surgeon's office.   Constipation Prevention   Complete by: As directed    Drink plenty of fluids.  Prune juice may be helpful.  You may use a stool  softener, such as Colace (over the counter) 100 mg twice a day.  Use MiraLax (over the counter) for constipation as needed.   Constipation Prevention   Complete by: As directed    Drink plenty of fluids.  Prune juice may be helpful.  You may use a stool softener, such as Colace (over the counter) 100 mg twice a day.  Use MiraLax (over the counter) for constipation as needed.   Diet - low sodium heart healthy   Complete by: As directed    Diet - low sodium heart healthy   Complete by: As directed    Discharge instructions   Complete by: As directed    INSTRUCTIONS AFTER JOINT REPLACEMENT   Remove items at home which could result in a fall. This includes throw rugs or furniture in walking pathways ICE to the affected joint every three hours while awake for 30 minutes at a time, for at least the first 3-5 days, and then as needed for pain and swelling.  Continue to use ice for pain and swelling. You may notice swelling that will progress down to the foot and ankle.  This is normal after surgery.  Elevate your leg when you are not up walking on it.   Continue to use the breathing machine you got in the hospital (incentive  spirometer) which will help keep your temperature down.  It is common for your temperature to cycle up and down following surgery, especially at night when you are not up moving around and exerting yourself.  The breathing machine keeps your lungs expanded and your temperature down.   DIET:  As you were doing prior to hospitalization, we recommend a well-balanced diet.  DRESSING / WOUND CARE / SHOWERING  Keep the surgical dressing until follow up.  The dressing is water proof, so you can shower without any extra covering.  IF THE DRESSING FALLS OFF or the wound gets wet inside, change the dressing with sterile gauze.  Please use good hand washing techniques before changing the dressing.  Do not use any lotions or creams on the incision until instructed by your surgeon.     ACTIVITY  Increase activity slowly as tolerated, but follow the weight bearing instructions below.   No driving for 6 weeks or until further direction given by your physician.  You cannot drive while taking narcotics.  No lifting or carrying greater than 10 lbs. until further directed by your surgeon. Avoid periods of inactivity such as sitting longer than an hour when not asleep. This helps prevent blood clots.  You may return to work once you are authorized by your doctor.     WEIGHT BEARING   Weight bearing as tolerated with assist device (walker, cane, etc) as directed, use it as long as suggested by your surgeon or therapist, typically at least 4-6 weeks.   EXERCISES  Results after joint replacement surgery are often greatly improved when you follow the exercise, range of motion and muscle strengthening exercises prescribed by your doctor. Safety measures are also important to protect the joint from further injury. Any time any of these exercises cause you to have increased pain or swelling, decrease what you are doing until you are comfortable again and then slowly increase them. If you have problems or questions, call your caregiver or physical therapist for advice.   Rehabilitation is important following a joint replacement. After just a few days of immobilization, the muscles of the leg can become weakened and shrink (atrophy).  These exercises are designed to build up the tone and strength of the thigh and leg muscles and to improve motion. Often times heat used for twenty to thirty minutes before working out will loosen up your tissues and help with improving the range of motion but do not use heat for the first two weeks following surgery (sometimes heat can increase post-operative swelling).   These exercises can be done on a training (exercise) mat, on the floor, on a table or on a bed. Use whatever works the best and is most comfortable for you.    Use music or television  while you are exercising so that the exercises are a pleasant break in your day. This will make your life better with the exercises acting as a break in your routine that you can look forward to.   Perform all exercises about fifteen times, three times per day or as directed.  You should exercise both the operative leg and the other leg as well.   Exercises include:   Quad Sets - Tighten up the muscle on the front of the thigh (Quad) and hold for 5-10 seconds.   Straight Leg Raises - With your knee straight (if you were given a brace, keep it on), lift the leg to 60 degrees, hold for 3 seconds, and slowly lower  the leg.  Perform this exercise against resistance later as your leg gets stronger.  Leg Slides: Lying on your back, slowly slide your foot toward your buttocks, bending your knee up off the floor (only go as far as is comfortable). Then slowly slide your foot back down until your leg is flat on the floor again.  Angel Wings: Lying on your back spread your legs to the side as far apart as you can without causing discomfort.  Hamstring Strength:  Lying on your back, push your heel against the floor with your leg straight by tightening up the muscles of your buttocks.  Repeat, but this time bend your knee to a comfortable angle, and push your heel against the floor.  You may put a pillow under the heel to make it more comfortable if necessary.   A rehabilitation program following joint replacement surgery can speed recovery and prevent re-injury in the future due to weakened muscles. Contact your doctor or a physical therapist for more information on knee rehabilitation.    CONSTIPATION  Constipation is defined medically as fewer than three stools per week and severe constipation as less than one stool per week.  Even if you have a regular bowel pattern at home, your normal regimen is likely to be disrupted due to multiple reasons following surgery.  Combination of anesthesia, postoperative  narcotics, change in appetite and fluid intake all can affect your bowels.   YOU MUST use at least one of the following options; they are listed in order of increasing strength to get the job done.  They are all available over the counter, and you may need to use some, POSSIBLY even all of these options:    Drink plenty of fluids (prune juice may be helpful) and high fiber foods Colace 100 mg by mouth twice a day  Senokot for constipation as directed and as needed Dulcolax (bisacodyl), take with full glass of water  Miralax (polyethylene glycol) once or twice a day as needed.  If you have tried all these things and are unable to have a bowel movement in the first 3-4 days after surgery call either your surgeon or your primary doctor.    If you experience loose stools or diarrhea, hold the medications until you stool forms back up.  If your symptoms do not get better within 1 week or if they get worse, check with your doctor.  If you experience "the worst abdominal pain ever" or develop nausea or vomiting, please contact the office immediately for further recommendations for treatment.   ITCHING:  If you experience itching with your medications, try taking only a single pain pill, or even half a pain pill at a time.  You can also use Benadryl over the counter for itching or also to help with sleep.   TED HOSE STOCKINGS:  Use stockings on both legs until for at least 2 weeks or as directed by physician office. They may be removed at night for sleeping.  MEDICATIONS:  See your medication summary on the "After Visit Summary" that nursing will review with you.  You may have some home medications which will be placed on hold until you complete the course of blood thinner medication.  It is important for you to complete the blood thinner medication as prescribed.  PRECAUTIONS:  If you experience chest pain or shortness of breath - call 911 immediately for transfer to the hospital emergency department.    If you develop a fever greater that 101  F, purulent drainage from wound, increased redness or drainage from wound, foul odor from the wound/dressing, or calf pain - CONTACT YOUR SURGEON.                                                   FOLLOW-UP APPOINTMENTS:  If you do not already have a post-op appointment, please call the office for an appointment to be seen by your surgeon.  Guidelines for how soon to be seen are listed in your "After Visit Summary", but are typically between 1-4 weeks after surgery.  OTHER INSTRUCTIONS:   Knee Replacement:  Do not place pillow under knee, focus on keeping the knee straight while resting. CPM instructions: 0-90 degrees, 2 hours in the morning, 2 hours in the afternoon, and 2 hours in the evening. Place foam block, curve side up under heel at all times except when in CPM or when walking.  DO NOT modify, tear, cut, or change the foam block in any way.  MAKE SURE YOU:  Understand these instructions.  Get help right away if you are not doing well or get worse.    Thank you for letting us be a part of your medical care team.  It is a privilege we respect greatly.  We hope these instructions will help you stay on track for a fast and full recovery!   Discharge instructions   Complete by: As directed    INSTRUCTIONS AFTER JOINT REPLACEMENT   Remove items at home which could result in a fall. This includes throw rugs or furniture in walking pathways ICE to the affected joint every three hours while awake for 30 minutes at a time, for at least the first 3-5 days, and then as needed for pain and swelling.  Continue to use ice for pain and swelling. You may notice swelling that will progress down to the foot and ankle.  This is normal after surgery.  Elevate your leg when you are not up walking on it.   Continue to use the breathing machine you got in the hospital (incentive spirometer) which will help keep your temperature down.  It is common for your temperature  to cycle up and down following surgery, especially at night when you are not up moving around and exerting yourself.  The breathing machine keeps your lungs expanded and your temperature down.   DIET:  As you were doing prior to hospitalization, we recommend a well-balanced diet.  DRESSING / WOUND CARE / SHOWERING  Keep the surgical dressing until follow up.  The dressing is water proof, so you can shower without any extra covering.  IF THE DRESSING FALLS OFF or the wound gets wet inside, change the dressing with sterile gauze.  Please use good hand washing techniques before changing the dressing.  Do not use any lotions or creams on the incision until instructed by your surgeon.    ACTIVITY  Increase activity slowly as tolerated, but follow the weight bearing instructions below.   No driving for 6 weeks or until further direction given by your physician.  You cannot drive while taking narcotics.  No lifting or carrying greater than 10 lbs. until further directed by your surgeon. Avoid periods of inactivity such as sitting longer than an hour when not asleep. This helps prevent blood clots.  You may return to work once you  are authorized by your doctor.     WEIGHT BEARING   Weight bearing as tolerated with assist device (walker, cane, etc) as directed, use it as long as suggested by your surgeon or therapist, typically at least 4-6 weeks.   EXERCISES  Results after joint replacement surgery are often greatly improved when you follow the exercise, range of motion and muscle strengthening exercises prescribed by your doctor. Safety measures are also important to protect the joint from further injury. Any time any of these exercises cause you to have increased pain or swelling, decrease what you are doing until you are comfortable again and then slowly increase them. If you have problems or questions, call your caregiver or physical therapist for advice.   Rehabilitation is important  following a joint replacement. After just a few days of immobilization, the muscles of the leg can become weakened and shrink (atrophy).  These exercises are designed to build up the tone and strength of the thigh and leg muscles and to improve motion. Often times heat used for twenty to thirty minutes before working out will loosen up your tissues and help with improving the range of motion but do not use heat for the first two weeks following surgery (sometimes heat can increase post-operative swelling).   These exercises can be done on a training (exercise) mat, on the floor, on a table or on a bed. Use whatever works the best and is most comfortable for you.    Use music or television while you are exercising so that the exercises are a pleasant break in your day. This will make your life better with the exercises acting as a break in your routine that you can look forward to.   Perform all exercises about fifteen times, three times per day or as directed.  You should exercise both the operative leg and the other leg as well.   Exercises include:   Quad Sets - Tighten up the muscle on the front of the thigh (Quad) and hold for 5-10 seconds.   Straight Leg Raises - With your knee straight (if you were given a brace, keep it on), lift the leg to 60 degrees, hold for 3 seconds, and slowly lower the leg.  Perform this exercise against resistance later as your leg gets stronger.  Leg Slides: Lying on your back, slowly slide your foot toward your buttocks, bending your knee up off the floor (only go as far as is comfortable). Then slowly slide your foot back down until your leg is flat on the floor again.  Angel Wings: Lying on your back spread your legs to the side as far apart as you can without causing discomfort.  Hamstring Strength:  Lying on your back, push your heel against the floor with your leg straight by tightening up the muscles of your buttocks.  Repeat, but this time bend your knee to a  comfortable angle, and push your heel against the floor.  You may put a pillow under the heel to make it more comfortable if necessary.   A rehabilitation program following joint replacement surgery can speed recovery and prevent re-injury in the future due to weakened muscles. Contact your doctor or a physical therapist for more information on knee rehabilitation.    CONSTIPATION  Constipation is defined medically as fewer than three stools per week and severe constipation as less than one stool per week.  Even if you have a regular bowel pattern at home, your normal regimen is likely  to be disrupted due to multiple reasons following surgery.  Combination of anesthesia, postoperative narcotics, change in appetite and fluid intake all can affect your bowels.   YOU MUST use at least one of the following options; they are listed in order of increasing strength to get the job done.  They are all available over the counter, and you may need to use some, POSSIBLY even all of these options:    Drink plenty of fluids (prune juice may be helpful) and high fiber foods Colace 100 mg by mouth twice a day  Senokot for constipation as directed and as needed Dulcolax (bisacodyl), take with full glass of water  Miralax (polyethylene glycol) once or twice a day as needed.  If you have tried all these things and are unable to have a bowel movement in the first 3-4 days after surgery call either your surgeon or your primary doctor.    If you experience loose stools or diarrhea, hold the medications until you stool forms back up.  If your symptoms do not get better within 1 week or if they get worse, check with your doctor.  If you experience "the worst abdominal pain ever" or develop nausea or vomiting, please contact the office immediately for further recommendations for treatment.   ITCHING:  If you experience itching with your medications, try taking only a single pain pill, or even half a pain pill at a time.   You can also use Benadryl over the counter for itching or also to help with sleep.   TED HOSE STOCKINGS:  Use stockings on both legs until for at least 2 weeks or as directed by physician office. They may be removed at night for sleeping.  MEDICATIONS:  See your medication summary on the "After Visit Summary" that nursing will review with you.  You may have some home medications which will be placed on hold until you complete the course of blood thinner medication.  It is important for you to complete the blood thinner medication as prescribed.  PRECAUTIONS:  If you experience chest pain or shortness of breath - call 911 immediately for transfer to the hospital emergency department.   If you develop a fever greater that 101 F, purulent drainage from wound, increased redness or drainage from wound, foul odor from the wound/dressing, or calf pain - CONTACT YOUR SURGEON.                                                   FOLLOW-UP APPOINTMENTS:  If you do not already have a post-op appointment, please call the office for an appointment to be seen by your surgeon.  Guidelines for how soon to be seen are listed in your "After Visit Summary", but are typically between 1-4 weeks after surgery.  OTHER INSTRUCTIONS:   Knee Replacement:  Do not place pillow under knee, focus on keeping the knee straight while resting. CPM instructions: 0-90 degrees, 2 hours in the morning, 2 hours in the afternoon, and 2 hours in the evening. Place foam block, curve side up under heel at all times except when in CPM or when walking.  DO NOT modify, tear, cut, or change the foam block in any way.  MAKE SURE YOU:  Understand these instructions.  Get help right away if you are not doing well or get worse.  Thank you for letting us be a part of your medical care team.  It is a privilege we respect greatly.  We hope these instructions will help you stay on track for a fast and full recovery!   Increase activity slowly as  tolerated   Complete by: As directed    Increase activity slowly as tolerated   Complete by: As directed       DISCHARGE MEDICATIONS:   Allergies as of 06/05/2019      Reactions   Boniva [ibandronate Sodium] Other (See Comments)   UNSPECIFIED REACTION    Lyrica [pregabalin] Nausea And Vomiting   Dizziness and blurred vision as well   Levofloxacin       Medication List    STOP taking these medications   meloxicam 7.5 MG tablet Commonly known as: MOBIC     TAKE these medications   acetaminophen 500 MG tablet Commonly known as: TYLENOL Take 2 tablets (1,000 mg total) by mouth every 6 (six) hours.   albuterol 108 (90 Base) MCG/ACT inhaler Commonly known as: VENTOLIN HFA Inhale 2 puffs into the lungs every 6 (six) hours as needed. What changed:   when to take this  reasons to take this   albuterol (2.5 MG/3ML) 0.083% nebulizer solution Commonly known as: PROVENTIL Take 3 mLs (2.5 mg total) by nebulization every 2 (two) hours as needed for wheezing or shortness of breath. What changed: Another medication with the same name was changed. Make sure you understand how and when to take each.   aluminum-magnesium hydroxide-simethicone 671-245-80 MG/5ML Susp Commonly known as: MAALOX Take 30 mLs by mouth as needed (indigestion).   amLODipine 5 MG tablet Commonly known as: NORVASC Take 5 mg by mouth daily.   amoxicillin-clavulanate 875-125 MG tablet Commonly known as: Augmentin Take 1 tablet by mouth 2 (two) times daily for 4 days.   aspirin 325 MG EC tablet Take 1 tablet (325 mg total) by mouth 2 (two) times daily.   atorvastatin 20 MG tablet Commonly known as: LIPITOR Take 20 mg by mouth daily.   diazepam 5 MG tablet Commonly known as: VALIUM Take 0.5 tablets (2.5 mg total) by mouth every 8 (eight) hours as needed for anxiety (for nerves). Must last 30 days. Filled 11-28-17   Fluticasone-Salmeterol 250-50 MCG/DOSE Aepb Commonly known as: Advair Diskus Inhale 1  puff into the lungs every 12 (twelve) hours.   gabapentin 400 MG capsule Commonly known as: NEURONTIN Take 1 capsule (400 mg total) by mouth 4 (four) times daily.   ketoconazole 2 % cream Commonly known as: NIZORAL Apply 1 application topically daily.   lisinopril 10 MG tablet Commonly known as: ZESTRIL Take 1 tablet (10 mg total) by mouth every morning.   metoCLOPramide 5 MG tablet Commonly known as: REGLAN Take 5 mg by mouth 2 (two) times daily.   metoprolol tartrate 25 MG tablet Commonly known as: LOPRESSOR Take 0.5 tablets (12.5 mg total) by mouth 2 (two) times daily.   omega-3 acid ethyl esters 1 g capsule Commonly known as: LOVAZA Take by mouth 2 (two) times daily.   pantoprazole 40 MG tablet Commonly known as: PROTONIX Take 1 tablet (40 mg total) by mouth daily at 6 (six) AM.   raloxifene 60 MG tablet Commonly known as: EVISTA Take 60 mg by mouth daily.   sertraline 100 MG tablet Commonly known as: ZOLOFT Take 100 mg by mouth daily.   tiotropium 18 MCG inhalation capsule Commonly known as: SPIRIVA Place 18 mcg into inhaler and  inhale daily.   tiZANidine 2 MG tablet Commonly known as: ZANAFLEX Take 1 tablet (2 mg total) by mouth every 6 (six) hours as needed.   traMADol 50 MG tablet Commonly known as: ULTRAM Take 1 tablet (50 mg total) by mouth 3 (three) times daily as needed.   Trelegy Ellipta 100-62.5-25 MCG/INH Aepb Generic drug: Fluticasone-Umeclidin-Vilant Inhale 1 puff into the lungs daily.   WOMENS 50+ ADVANCED PO Take 1 tablet by mouth daily.            Durable Medical Equipment  (From admission, onward)         Start     Ordered   05/27/19 1158  DME Walker rolling  Once    Question:  Patient needs a walker to treat with the following condition  Answer:  S/P total knee replacement   05/27/19 1157   05/27/19 1158  DME 3 n 1  Once     05/27/19 1157   05/27/19 1158  DME Bedside commode  Once    Question:  Patient needs a bedside  commode to treat with the following condition  Answer:  S/P total knee replacement   05/27/19 1157          FOLLOW UP VISIT:    Contact information for follow-up providers    [facility] Follow up.   Specialty: Home Health Services Contact information: [place] [address] [phone]            Contact information for after-discharge care    Destination    [facility] .   Service: Skilled Nursing Contact information: [place] [phone]                  DISPOSITION: HOME VS. SNF  CONDITION:  Keenan Bachelor 06/05/2019, 1:23 PM

## 2019-06-05 NOTE — [place] FL2 (Addendum)
[place] MEDICAID FL2 LEVEL OF CARE SCREENING TOOL     IDENTIFICATION  Patient Name: [name] Birthdate: Feb 16, 1941 Sex: female Admission Date (Current Location): 05/27/2019  [facility] and [place] Number:  Herbalist and Address:  [facility],  [place], [place]      Provider Number: [id number]  Attending Physician Name and Address:  Vickey Huger, MD  Relative Name and Phone Number:  Mechele Dawley [phone]  [phone]    Current Level of Care: Hospital Recommended Level of Care: [place] Prior Approval Number:    Date Approved/Denied:   PASRR Number: [id number]  Discharge Plan: SNF    Current Diagnoses: Patient Active Problem List   Diagnosis Date Noted  . Cough   . Acute metabolic encephalopathy 99/24/2683  . S/P total knee replacement 05/27/2019  . 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 Bilateral renal cysts 05/21/2018  . Hiatal hernia 05/21/2018  . 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 Thoracic radiculitis (Bilateral) (L>R) 05/03/2018  . Chronic thoracic back pain (Left) 05/03/2018  . Injury of peripheral nerves of thorax, sequela 03/22/2018  . Chest pain 03/21/2018  . Rib pain (Left) 03/21/2018  . Chronic rib pain (Right) 03/20/2018  . DDD (degenerative disc disease), lumbar 03/20/2018  . Chronic fracture of pubic ramus, sequela (Right) 03/20/2018  . History of femoral neck fracture, sequela (Left) 03/20/2018    Class: History of  . Lumbar facet arthropathy (Bilateral) 03/20/2018  . Lumbar facet syndrome (Bilateral) 03/20/2018  . Osteoarthritis of facet joint of lumbar spine 03/20/2018  . Spondylosis without myelopathy or radiculopathy, lumbosacral region 03/20/2018  . Other specified dorsopathies, sacral and sacrococcygeal region 03/20/2018  . Chronic pain syndrome 02/26/2018  . Cancer related pain 02/26/2018  . 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 Chronic low back pain (Secondary Area of Pain) (Bilateral) 02/26/2018  . 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 Chronic lower extremity pain [facility] Area of Pain) (Bilateral) (R>L) 02/26/2018  . Chronic hip pain (Fourth Area of Pain) (Bilateral) (R>L) 02/26/2018  . Neurogenic pain 02/26/2018  . Pharmacologic therapy 02/26/2018  . Problems influencing health status 02/26/2018  . Long term prescription benzodiazepine use 02/26/2018  . Long term prescription opiate use 02/26/2018  . Acute systolic heart failure ([place])   . Non-ST elevation (NSTEMI) myocardial infarction ([place])   . History of uterine cancer 12/08/2017  . Generalized anxiety disorder 12/08/2017  . GERD (gastroesophageal reflux disease) 12/08/2017  . Substernal chest pain 12/08/2017  . Elevated brain natriuretic peptide (BNP) level 12/08/2017  . Elevated troponin 12/08/2017  . Embedded tick of upper back excluding scapular region 12/08/2017  . Chronic sacroiliac joint pain 06/21/2017  . Disorder of skeletal system 06/21/2017  . Other long term (current) drug therapy 06/21/2017  . Other specified health status 06/21/2017  . Chronic chest wall pain (Primary Area of Pain) 06/21/2017  . Chronic post-thoracotomy pain 03/03/2017  . 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 Adenocarcinoma of right lung, stage 1 ([place]) 07/28/2016  . S/P lobectomy of lung 05/09/2016  . DVT (deep venous thrombosis) ([place]) 04/26/2016  . Heart murmur   . Acute exacerbation of chronic obstructive pulmonary disease (COPD) ([place]) 08/29/2015  . Essential hypertension 08/29/2015  . Hypercholesterolemia 08/29/2015  . CKD (chronic kidney disease), stage III ([place]) 08/29/2015  . COPD (chronic obstructive pulmonary disease) ([place]) 04/27/2015  . Acute on chronic respiratory failure with hypoxia ([place]) 04/27/2015  .  Infected sebaceous cyst 08/16/2011    Orientation RESPIRATION BLADDER Height &  Weight     Self, Time, Place  O2 Continent Weight: 155 lb 12.8 oz (70.7 kg) Height:  5\' 2"  (157.5 cm)  BEHAVIORAL SYMPTOMS/MOOD NEUROLOGICAL BOWEL NUTRITION STATUS      Continent Diet(Low Sodium Heart Healthy)  AMBULATORY STATUS COMMUNICATION OF NEEDS Skin   Extensive Assist Verbally Surgical wounds                       Personal Care Assistance Level of Assistance  Bathing, Feeding, Dressing Bathing Assistance: Maximum assistance Feeding assistance: Independent Dressing Assistance: Maximum assistance     Functional Limitations Info  Sight, Hearing, Speech Sight Info: Impaired Hearing Info: Adequate Speech Info: Adequate    SPECIAL CARE FACTORS FREQUENCY  PT (By licensed PT), OT (By licensed OT)     PT Frequency: 5x/week OT Frequency: 5x/week            Contractures Contractures Info: Not present    Additional Factors Info  Code Status, Allergies, Psychotropic Code Status Info: Fullcode Allergies Info: Allergies: Boniva Ibandronate Sodium, Lyrica Pregabalin, Levofloxacin Psychotropic Info: Zoloft         Current Medications (06/05/2019):  This is the current hospital active medication list Current Facility-Administered Medications  Medication Dose Route Frequency Provider Last Rate Last Dose  . 0.9 %  sodium chloride infusion   Intravenous Continuous Regalado, Belkys A, MD 100 mL/hr at 06/03/19 0600    . acetaminophen (TYLENOL) tablet 650 mg  650 mg Oral Q6H PRN Karmen Bongo, MD   650 mg at 06/04/19 2152  . albuterol (PROVENTIL) (2.5 MG/3ML) 0.083% nebulizer solution 2.5 mg  2.5 mg Inhalation Q4H PRN Donia Ast, PA      . alum & mag hydroxide-simeth (MAALOX/MYLANTA) 200-200-20 MG/5ML suspension 30 mL  30 mL Oral Q4H PRN Donia Ast, [place]      . amLODipine (NORVASC) tablet 10 mg  10 mg Oral Daily Georgette Shell, MD   10 mg at 06/04/19 1107  . amoxicillin-clavulanate (AUGMENTIN) 875-125 MG per tablet 1 tablet  1 tablet Oral Q12H  Georgette Shell, MD      . aspirin EC tablet 325 mg  325 mg Oral BID Donia Ast, [place]   325 mg at 06/04/19 2152  . atorvastatin (LIPITOR) tablet 20 mg  20 mg Oral Daily Donia Ast, [place]   20 mg at 06/04/19 1108  . azithromycin (ZITHROMAX) tablet 500 mg  500 mg Oral Daily Georgette Shell, MD      . bisacodyl (DULCOLAX) EC tablet 5 mg  5 mg Oral Daily PRN Donia Ast, [place]      . cholecalciferol (VITAMIN D3) tablet 1,000 Units  1,000 Units Oral Daily Georgette Shell, MD   1,000 Units at 06/03/19 1348  . diazepam (VALIUM) tablet 2.5 mg  2.5 mg Oral Q8H PRN Donia Ast, [place]   2.5 mg at 05/30/19 1808  . docusate sodium (COLACE) capsule 100 mg  100 mg Oral BID Donia Ast, [place]   100 mg at 06/04/19 1106  . feeding supplement (ENSURE ENLIVE) (ENSURE ENLIVE) liquid 237 mL  237 mL Oral BID BM Regalado, Belkys A, MD   237 mL at 06/03/19 1348  . ferrous sulfate tablet 325 mg  325 mg Oral TID PC Donia Ast, [place]   325 mg at 06/04/19 1801  . fluticasone furoate-vilanterol (BREO ELLIPTA) 100-25 MCG/INH 1 puff  1 puff Inhalation Daily Annye, [place], [place]   1 puff at 06/05/19 1540  . gabapentin (NEURONTIN) capsule 400 mg  400 mg Oral TID Donia Ast, [place]   400 mg at 06/04/19 2153  . lisinopril (ZESTRIL) tablet 10 mg  10 mg Oral q morning - 10a Donia Ast, [place]   10 mg at 06/04/19 1107  . menthol-cetylpyridinium (CEPACOL) lozenge 3 mg  1 lozenge Oral PRN Donia Ast, PA       Or  . phenol [facility]) mouth spray 1 spray  1 spray Mouth/Throat PRN Donia Ast, [place]      . methocarbamol (ROBAXIN) tablet 500 mg  500 mg Oral Q6H PRN Donia Ast, [place]   500 mg at 06/03/19 1720  . metoCLOPramide (REGLAN) tablet 5 mg  5 mg Oral BID Donia Ast, [place]   5 mg at 06/04/19 2152  . metoprolol tartrate (LOPRESSOR) tablet 12.5 mg  12.5 mg Oral BID Donia Ast, [place]   12.5 mg at 06/04/19 2152  . mometasone-formoterol (DULERA)  200-5 MCG/ACT inhaler 2 puff  2 puff Inhalation BID Donia Ast, [place]   2 puff at 06/05/19 0867  . multivitamin with minerals tablet 1 tablet  1 tablet Oral Daily Vickey Huger, MD   1 tablet at 06/04/19 1108  . ondansetron (ZOFRAN) tablet 4 mg  4 mg Oral Q6H PRN Donia Ast, [place]       Or  . ondansetron [facility]) injection 4 mg  4 mg Intravenous Q6H PRN Donia Ast, [place]      . pantoprazole (PROTONIX) EC tablet 40 mg  40 mg Oral Q0600 Donia Ast, [place]   40 mg at 06/05/19 6195  . raloxifene (EVISTA) tablet 60 mg  60 mg Oral Daily Donia Ast, [place]   60 mg at 06/04/19 1107  . senna-docusate (Senokot-S) tablet 1 tablet  1 tablet Oral BID Regalado, Belkys A, MD   1 tablet at 06/04/19 2152  . sertraline (ZOLOFT) tablet 100 mg  100 mg Oral Daily Donia Ast, [place]   100 mg at 06/04/19 1108  . traMADol (ULTRAM) tablet 50 mg  50 mg Oral Q6H PRN Regalado, Belkys A, MD   50 mg at 06/03/19 1631  . umeclidinium bromide (INCRUSE ELLIPTA) 62.5 MCG/INH 1 puff  1 puff Inhalation Daily Jamile, Rekowski, RPH   1 puff at 06/05/19 0932  . Vitamin D (Ergocalciferol) (DRISDOL) capsule 50,000 Units  50,000 Units Oral Q7 days Georgette Shell, MD   50,000 Units at 06/03/19 1349     Discharge Medications: Please see discharge summary for a list of discharge medications.  Relevant Imaging Results:  Relevant Lab Results:   Additional Information IZT:[id number]  Lia Hopping, LCSW

## 2019-06-05 NOTE — Progress Notes (Signed)
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 Per nurses report patient has been declining over the last couple of days initially requiring 1 assist, now requiring to assist for ADLs. Patient has been confused over the last couple of days. On admission she was alert and oriented x3. Over the course of hospitalization she has become confused. She has become very weak to the point that she is not able to hold with her hand her medications. Patient has been complaining of pain generalized, she report pain in her shoulders and her hands chest per nurse report. [facility] was consulted on 06/02/2019 to help assist with medical management, confusion, fever and chest pain.  Patient seen and evaluated. Patient is alert, she is oriented to person and place. Not to situation. She does not know why she is in the hospital. She is following commands. She is complaining of abdominal pain. She is also complaining of shoulders and upper extremitiesPains. She is unable to tell me when was her last bowel movement.  Patient had a mild fever with a temperature 101.4 last night 11/7.Labs sodium 134, potassium 3.4, BUN 22, creatinine 0.78, CRP 20, white blood cell 10, hemoglobin 9, platelets 320.UA -0-5 white blood cell. Chest x-ray;no active  cardiopulmonary disease.   Assessment & Plan:   Active Problems:   COPD (chronic obstructive pulmonary disease) (HCC)   Essential hypertension   Hypercholesterolemia   CKD (chronic kidney disease), stage III (HCC)   Adenocarcinoma of right lung, stage 1 (HCC)   Neuropathic pain syndrome (non-herpetic)   Chest pain   S/P total knee replacement   Acute metabolic encephalopathy   Cough  #1 acute metabolic encephalopathy .likely secondary to pneumonia. She was started on Rocephin and azithromycin and vancomycin 06/02/2019. Vancomycin was DC'd once MRSA PCR came back negative.  She can be discharged today to rehab on Augmentin to finish the course.  CT of the chest showed right lower lobe consolidation.  Small right pleural effusion.  She still has some periods of confusion but improved.  She thinks this is a hospital but she does not know why she came here for.  CT of the head 06/02/2019 shows advanced chronic small vessel ischemic disease.    #2 hypertension uncontrolled continue Norvasc metoprolol Zestril the dose of Norvasc was increased 06/03/2019.  Increase Norvasc continue metoprololand zestril.  Blood pressure 151/64 prior to morning medications.  #3 right knee arthroplasty continue aspirin 325 twice a day for DVT prophylaxis per Ortho. Ultram for pain control. COVID - 06/03/2019.  #4 depression continue Zoloft  #5 osteoporosis continue Evista and  #6 hyperlipidemia continue Lipitor  #7 severe vitamin D deficiency replete    Nutrition Problem: Increased nutrient needs Etiology: post-op healing, chronic illness(COPD)     Signs/Symptoms: estimated needs    Interventions: Ensure Enlive (each supplement provides 350kcal and 20 grams of protein), MVI  Estimated body mass index is 28.5 kg/m as calculated  from the following:   Height as of this encounter: 5\' 2"  (1.575 m).   Weight as of this encounter: 70.7 kg.    Subjective:  She is resting in bed denies  shortness of breath cough nausea vomiting or diarrhea.  She is watching TV.  She looked at [facility] and asked me is Biden the president   Objective: Vitals:   06/04/19 2231 06/05/19 0618 06/05/19 0902 06/05/19 0906  BP: (!) 156/75 (!) 184/82    Pulse: 86 73    Resp: 16 16    Temp: 99.8 F (37.7 C) 99.1 F (37.3 C)    TempSrc: Oral Oral    SpO2: 97% 94% 90% 90%  Weight:      Height:        Intake/Output Summary (Last 24 hours) at 06/05/2019 0952 Last data filed at 06/05/2019 0618 Gross per 24 hour  Intake 930 ml  Output 2110 ml  Net -1180 ml   Filed Weights   05/27/19 0613  Weight: 70.7 kg    Examination:  General exam: Appears calm and comfortable  Respiratory system: Clear to auscultation. Respiratory effort normal. Cardiovascular system: S1 & S2 heard, RRR. No JVD, murmurs, rubs, gallops or clicks. No pedal edema. Gastrointestinal system: Abdomen is nondistended, soft and nontender. No organomegaly or masses felt. Normal bowel sounds heard. Central nervous system: Alert and oriented. No focal neurological deficits. Extremities: Right knee covered in dressings Skin: No rashes, lesions or ulcers  Data Reviewed: I have personally reviewed following labs and imaging studies  CBC: Recent Labs  Lab 05/30/19 0249 06/02/19 0707 06/03/19 1310  WBC 9.8 10.7* 12.6*  HGB 9.9* 10.2* 9.7*  HCT 31.8* 31.7* 32.5*  MCV 88.8 88.3 92.1  PLT 258 320 562   Basic Metabolic Panel: Recent Labs  Lab 06/02/19 0707 06/03/19 1310  NA 134* 136  K 3.4* 3.6  CL 99 104  CO2 23 22  GLUCOSE 95 115*  BUN 22 22  CREATININE 0.78 0.83  CALCIUM 8.2* 8.0*   GFR: Estimated Creatinine Clearance: 53.1 mL/min (by C-G formula based on SCr of 0.83 mg/dL). Liver Function Tests: Recent Labs  Lab 06/02/19 0806  AST 17  ALT 12  ALKPHOS 79  BILITOT 1.6*  PROT 6.7  ALBUMIN 2.5*   No results for input(s): LIPASE, AMYLASE in the last 168 hours. Recent Labs  Lab 06/02/19 1014  AMMONIA 18    Coagulation Profile: No results for input(s): INR, PROTIME in the last 168 hours. Cardiac Enzymes: No results for input(s): CKTOTAL, CKMB, CKMBINDEX, TROPONINI in the last 168 hours. BNP (last 3 results) No results for input(s): PROBNP in the last 8760 hours. HbA1C: No results for input(s): HGBA1C in the last 72 hours. CBG: No results for input(s): GLUCAP in the last 168 hours. Lipid Profile: No results for input(s): CHOL, HDL, LDLCALC, TRIG, CHOLHDL, LDLDIRECT in the last 72 hours. Thyroid Function Tests: No results for input(s): TSH, T4TOTAL, FREET4, T3FREE, THYROIDAB in the last 72 hours. Anemia Panel: No results for input(s): VITAMINB12, FOLATE, FERRITIN, TIBC, IRON, RETICCTPCT in the last 72 hours. Sepsis Labs: No results for input(s): PROCALCITON, LATICACIDVEN in the last 168 hours.  Recent Results (from the past 240 hour(s))  MRSA PCR Screening     Status: None   Collection Time: 06/02/19  2:24 PM   Specimen: Nasal Mucosa; Nasopharyngeal  Result Value Ref Range Status   MRSA by PCR NEGATIVE NEGATIVE Final    Comment:        The  GeneXpert MRSA Assay (FDA approved for NASAL specimens only), is one component of a comprehensive MRSA colonization surveillance program. It is not intended to diagnose MRSA infection nor to guide or monitor treatment for MRSA infections. Performed at [facility], [place] [address] [place], [place] [address]   SARS CORONAVIRUS 2 (TAT 6-24 HRS) Nasopharyngeal Nasopharyngeal Swab     Status: None   Collection Time: 06/03/19  1:30 PM   Specimen: Nasopharyngeal Swab  Result Value Ref Range Status   SARS Coronavirus 2 NEGATIVE NEGATIVE Final    Comment: (NOTE) SARS-CoV-2 target nucleic acids are NOT DETECTED. The SARS-CoV-2 RNA is generally detectable in upper and lower respiratory specimens during the acute phase of infection. Negative results do not preclude SARS-CoV-2 infection, do not rule out co-infections with other  pathogens, and should not be used as the sole basis for treatment or other patient management decisions. Negative results must be combined with clinical observations, patient history, and epidemiological information. The expected result is Negative. Fact Sheet for Patients: SugarRoll.be Fact Sheet for Healthcare Providers: https://www.woods-mathews.com/ This test is not yet approved or cleared by the [place] FDA and  has been authorized for detection and/or diagnosis of SARS-CoV-2 by FDA under an Emergency Use Authorization (EUA). This EUA will remain  in effect (meaning this test can be used) for the duration of the COVID-19 declaration under Section 56 4(b)(1) of the Act, 21 U.S.C. section 360bbb-3(b)(1), unless the authorization is terminated or revoked sooner. Performed at [facility], [place] [address], [place], [place] [address]          Radiology Studies: Dg Abd 1 View  Result Date: 06/04/2019 CLINICAL DATA:  Acute generalized abdominal pain. EXAM: ABDOMEN - 1 VIEW COMPARISON:  June 02, 2019. FINDINGS: No evidence of bowel obstruction or ileus. Status post cholecystectomy. Old fractures are seen involving the right inferior and superior pubic rami. IMPRESSION: No evidence of bowel obstruction or ileus. Electronically Signed   By: Marijo Conception M.D.   On: 06/04/2019 09:53   Mr Brain Wo Contrast  Result Date: 06/03/2019 CLINICAL DATA:  Encephalopathy EXAM: MRI HEAD WITHOUT CONTRAST TECHNIQUE: Multiplanar, multiecho pulse sequences of the brain and surrounding structures were obtained without intravenous contrast. COMPARISON:  Head CT 06/02/2019 FINDINGS: The examination had to be discontinued prior to completion due to patient altered mental status and inability to comply with the technologist's instructions. Five series are provided. There is no large acute infarct or extra-axial collection. There is generalized volume  loss, greater than expected for age. There is diffuse confluent white matter hyperintensity consistent with chronic small vessel ischemia. No midline shift or other mass effect. The major flow voids are normal. Paranasal sinuses are clear. There are bilateral lens replacements. IMPRESSION: 1. Truncated examination due to patient altered mental status and inability to comply with the technologist's instructions. 2. Within that limitation, no acute ischemia or other acute intracranial abnormality. 3. Age advanced volume loss and chronic small vessel ischemia. Electronically Signed   By: Ulyses Jarred M.D.   On: 06/03/2019 20:22        Scheduled Meds: . amLODipine  10 mg Oral Daily  . amoxicillin-clavulanate  1 tablet Oral Q12H  . aspirin EC  325 mg Oral BID  . atorvastatin  20 mg Oral Daily  . azithromycin  500 mg Oral Daily  . cholecalciferol  1,000 Units Oral Daily  . docusate sodium  100 mg Oral BID  . feeding supplement (ENSURE ENLIVE)  237 mL Oral BID BM  . ferrous sulfate  325 mg Oral TID PC  . fluticasone furoate-vilanterol  1 puff Inhalation Daily  . gabapentin  400 mg Oral TID  . lisinopril  10 mg Oral q morning - 10a  . metoCLOPramide  5 mg Oral BID  . metoprolol tartrate  12.5 mg Oral BID  . mometasone-formoterol  2 puff Inhalation BID  . multivitamin with minerals  1 tablet Oral Daily  . pantoprazole  40 mg Oral Q0600  . raloxifene  60 mg Oral Daily  . senna-docusate  1 tablet Oral BID  . sertraline  100 mg Oral Daily  . umeclidinium bromide  1 puff Inhalation Daily  . Vitamin D (Ergocalciferol)  50,000 Units Oral Q7 days   Continuous Infusions: . sodium chloride 100 mL/hr at 06/03/19 0600     LOS: 8 days    Time spent:    Georgette Shell, MD [facility]  If 7PM-7AM, please contact night-coverage [email or web address] Password [email or web address] 06/05/2019, 9:52 AM

## 2019-06-05 NOTE — TOC Transition Note (Signed)
Transition of Care [facility]) - CM/SW Discharge Note   Patient Details  Name: MYLEY BAHNER MRN: [id number] Date of Birth: [birth year]  Transition of Care [facility]) CM/SW Contact:  Lia Hopping, [place] Phone Number: 06/05/2019, 10:08 AM   Clinical Narrative:    Therapy Plan: [place] [facility]    Final next level of care: [place] Barriers to Discharge: Barriers Resolved   Patient Goals and CMS Choice Patient states their goals for this hospitalization and ongoing recovery are:: "get better at [facility] (SNF)" CMS Medicare.gov Compare Post Acute Care list provided to:: Patient Choice offered to / list presented to : Patient  Discharge Placement   Existing PASRR number confirmed : 06/05/19          Patient chooses bed at: ([place] [facility]) Patient to be transferred to facility by: [place] Name of family member notified: Son-Wilie Patient and family notified of of transfer: 06/05/19  Discharge Plan and Services                                     Social Determinants of Health (SDOH) Interventions     Readmission Risk Interventions No flowsheet data found.

## 2019-06-05 NOTE — Progress Notes (Signed)
Attempted call to [facility], left message with admission nurse number at 1400 to call back for report. Bethann Punches RN

## 2019-06-05 NOTE — Discharge Summary (Deleted)
SPORTS MEDICINE & JOINT REPLACEMENT   [name], MD   Carlyon Shadow, PA-C [address], [place], [place]  [address]                             [phone]  PATIENT ID: [name]        MRN:  [id number]          DOB/AGE: [birth year] / 78 y.o.    DISCHARGE SUMMARY  ADMISSION DATE:    05/27/2019 DISCHARGE DATE:   06/05/2019   ADMISSION DIAGNOSIS: Primary Osteoarthritis Right Knee    DISCHARGE DIAGNOSIS:  Primary Osteoarthritis Right Knee    ADDITIONAL DIAGNOSIS: Active Problems:   COPD (chronic obstructive pulmonary disease) (HCC)   Essential hypertension   Hypercholesterolemia   CKD (chronic kidney disease), stage III (HCC)   Adenocarcinoma of right lung, stage 1 (HCC)   Neuropathic pain syndrome (non-herpetic)   Chest pain   S/P total knee replacement   Acute metabolic encephalopathy   Cough  Past Medical History:  Diagnosis Date  . Adenocarcinoma of right lung, stage 1 ([facility]) 07/28/2016  . Anxiety   . Asthma   . Cancer ([place])    uterine  . COPD (chronic obstructive pulmonary disease) ([facility])   . Coronary artery disease    mild nonobstructive by 2019 cath  . Cyst    left side of neck  . Depression   . GERD (gastroesophageal reflux disease)   . Hyperlipidemia   . Hypertension   . Low back pain   . Osteoarthritis   . Osteoporosis   . Takotsubo cardiomyopathy    EF recovered to 50-55% by 01/23/18 echo    PROCEDURE: Procedure(s): TOTAL KNEE ARTHROPLASTY on 05/27/2019  CONSULTS: Treatment Team:  Fatima Blank, MD   HISTORY:  See H&P in chart  HOSPITAL COURSE:  [name] is a 78 y.o. admitted on 05/27/2019 and found to have a diagnosis of Primary Osteoarthritis Right Knee.  After appropriate laboratory studies were obtained  they were taken to the operating room on 05/27/2019 and underwent Procedure(s): TOTAL KNEE ARTHROPLASTY.   They were given perioperative antibiotics:  Anti-infectives (From admission, onward)   Start     Dose/Rate Route  Frequency Ordered Stop   06/05/19 1000  amoxicillin-clavulanate (AUGMENTIN) 875-125 MG per tablet 1 tablet     1 tablet Oral Every 12 hours 06/05/19 0842 06/07/19 0959   06/05/19 1000  azithromycin (ZITHROMAX) tablet 500 mg     500 mg Oral Daily 06/05/19 0842 06/07/19 0959   06/03/19 1800  vancomycin (VANCOCIN) IVPB 1000 mg/200 mL premix  Status:  Discontinued     1,000 mg 200 mL/hr over 60 Minutes Intravenous Every 24 hours 06/02/19 1601 06/03/19 1106   06/03/19 1800  azithromycin (ZITHROMAX) 500 mg in sodium chloride 0.9 % 250 mL IVPB  Status:  Discontinued     500 mg 250 mL/hr over 60 Minutes Intravenous Every 24 hours 06/03/19 1108 06/05/19 0842   06/02/19 1800  vancomycin (VANCOCIN) 1,500 mg in sodium chloride 0.9 % 500 mL IVPB     1,500 mg 250 mL/hr over 120 Minutes Intravenous  Once 06/02/19 1601 06/02/19 2111   06/02/19 1600  cefTRIAXone (ROCEPHIN) 2 g in sodium chloride 0.9 % 100 mL IVPB  Status:  Discontinued     2 g 200 mL/hr over 30 Minutes Intravenous Every 24 hours 06/02/19 1424 06/05/19 0842   06/02/19 1600  azithromycin (ZITHROMAX) 500  mg in sodium chloride 0.9 % 250 mL IVPB  Status:  Discontinued     500 mg 250 mL/hr over 60 Minutes Intravenous Every 24 hours 06/02/19 1424 06/03/19 1055   05/27/19 1400  ceFAZolin (ANCEF) IVPB 2g/100 mL premix     2 g 200 mL/hr over 30 Minutes Intravenous Every 6 hours 05/27/19 1157 05/27/19 2200   05/27/19 0600  vancomycin (VANCOCIN) IVPB 1000 mg/200 mL premix     1,000 mg 200 mL/hr over 60 Minutes Intravenous On call to O.R. 05/27/19 1856 05/27/19 0833   05/27/19 0600  ceFAZolin (ANCEF) IVPB 2g/100 mL premix  Status:  Discontinued     2 g 200 mL/hr over 30 Minutes Intravenous On call to O.R. 05/27/19 0522 05/27/19 1156    .  Patient given tranexamic acid IV or topical and exparel intra-operatively.  Tolerated the procedure well.    POD# 1: Vital signs were stable.  Patient denied Chest pain, shortness of breath, or calf pain.   Patient was started on Aspirin twice daily at 8am.  Consults to PT, OT, and care management were made.  The patient was weight bearing as tolerated.  CPM was placed on the operative leg 0-90 degrees for 6-8 hours a day. When out of the CPM, patient was placed in the foam block to achieve full extension. Incentive spirometry was taught.  Dressing was changed.       POD #2, Continued  PT for ambulation and exercise program.  IV saline locked.  O2 discontinued.    The remainder of the hospital course was dedicated to ambulation and strengthening.   The patient was discharged on 9 Days Post-Op in  Good condition.  Blood products given:none  DIAGNOSTIC STUDIES: Recent vital signs:  Patient Vitals for the past 24 hrs:  BP Temp Temp src Pulse Resp SpO2  06/05/19 0906 - - - - - 90 %  06/05/19 0902 - - - - - 90 %  06/05/19 0618 (!) 184/82 99.1 F (37.3 C) Oral 73 16 94 %  06/04/19 2231 (!) 156/75 99.8 F (37.7 C) Oral 86 16 97 %  06/04/19 2159 - - - - - 95 %  06/04/19 1428 (!) 158/82 98.8 F (37.1 C) Oral 73 16 97 %       Recent laboratory studies: Recent Labs    05/30/19 0249 06/02/19 0707 06/03/19 1310  WBC 9.8 10.7* 12.6*  HGB 9.9* 10.2* 9.7*  HCT 31.8* 31.7* 32.5*  PLT 258 320 320   Recent Labs    06/02/19 0707 06/03/19 1310  NA 134* 136  K 3.4* 3.6  CL 99 104  CO2 23 22  BUN 22 22  CREATININE 0.78 0.83  GLUCOSE 95 115*  CALCIUM 8.2* 8.0*   Lab Results  Component Value Date   INR 1.00 12/09/2017   INR 0.99 05/05/2016   INR 1.38 04/08/2010     Recent Radiographic Studies :  Dg Abd 1 View  Result Date: 06/04/2019 CLINICAL DATA:  Acute generalized abdominal pain. EXAM: ABDOMEN - 1 VIEW COMPARISON:  June 02, 2019. FINDINGS: No evidence of bowel obstruction or ileus. Status post cholecystectomy. Old fractures are seen involving the right inferior and superior pubic rami. IMPRESSION: No evidence of bowel obstruction or ileus. Electronically Signed   By: Marijo Conception M.D.   On: 06/04/2019 09:53   Dg Abd 1 View  Result Date: 06/02/2019 CLINICAL DATA:  Abdominal pain/distension. EXAM: ABDOMEN - 1 VIEW COMPARISON:  08/29/2017 FINDINGS: Air and  stool present throughout the colon with moderate fecal retention over the rectum. There are a few air-filled nondilated small bowel loops present. No free peritoneal air. Previous cholecystectomy. Mild degenerate change of the spine and hips. Partially visualized hardware over the left femoral neck intact. Old right pelvic fracture. Intervertebral cage at the L5-S1 level unchanged. IMPRESSION: Nonspecific, nonobstructive bowel gas pattern with moderate fecal retention over the rectum. Electronically Signed   By: Marin Olp M.D.   On: 06/02/2019 09:43   Ct Head Wo Contrast  Result Date: 06/02/2019 CLINICAL DATA:  Altered level of consciousness (LOC), unexplained. Additional history provided: Status post right knee replacement. Additional history: History of adenocarcinoma status post right upper lobectomy. EXAM: CT HEAD WITHOUT CONTRAST TECHNIQUE: Contiguous axial images were obtained from the base of the skull through the vertex without intravenous contrast. COMPARISON:  Report from head CT 01/31/2010 (images unavailable), head CT 10/08/2008 report from brain MRI 12/01/2000 FINDINGS: Brain: The examination is motion degraded, limiting evaluation. There is a punctate focus of hyperdensity within the right periatrial white matter which likely reflects a tiny calcification. No convincing evidence of acute intracranial hemorrhage. No demarcated cortical infarction. No evidence of intracranial mass. No midline shift or extra-axial fluid collection. Extensive nearly confluent hypodensity within the bilateral cerebral white matter has progressed from prior head CT 10/08/2008, as has heterogeneity of the bilateral basal ganglia and thalami. This finding is nonspecific but may reflect advanced chronic small vessel ischemic disease.  Cerebral volume is normal for age.  Partially empty sella turcica. Vascular: No hyperdense vessel.  Atherosclerotic calcifications. Skull: Normal. Negative for fracture or focal lesion. Sinuses/Orbits: Visualized orbits demonstrate no acute abnormality. Bilateral posterior staphyloma globe deformities. No significant paranasal sinus disease or mastoid effusion at the imaged levels. IMPRESSION: 1. Significantly motion degraded examination. 2. No evidence of acute intracranial abnormality. 3. Advanced cerebral white matter disease and heterogeneity of the bilateral basal ganglia and thalami has progressed from prior head CT 10/08/2008. Findings are nonspecific but likely reflect advanced chronic small vessel ischemic disease. Consider brain MRI to exclude superimposed acute infarct, if clinically warranted. Electronically Signed   By: Kellie Simmering DO   On: 06/02/2019 11:56   Ct Angio Chest Pe W Or Wo Contrast  Result Date: 06/02/2019 CLINICAL DATA:  Patient with recent right knee replacement. Confusion. Evaluate for pulmonary embolus. EXAM: CT ANGIOGRAPHY CHEST WITH CONTRAST TECHNIQUE: Multidetector CT imaging of the chest was performed using the standard protocol during bolus administration of intravenous contrast. Multiplanar CT image reconstructions and MIPs were obtained to evaluate the vascular anatomy. CONTRAST:  1mL OMNIPAQUE IOHEXOL 350 MG/ML SOLN COMPARISON:  Chest CT 08/02/2018 FINDINGS: Cardiovascular: Normal heart size. Thoracic aortic vascular calcifications. Adequate opacification of the pulmonary arterial system. No intraluminal filling defects identified to suggest acute pulmonary embolus. Mediastinum/Nodes: No axillary lymphadenopathy. Similar-appearing 8 mm right paratracheal lymph node (image 15; series 4). No definite hilar adenopathy. Normal appearance of the esophagus. Lungs/Pleura: Central airways are patent. Dependent atelectasis within the left lower lobe. Left apical bullous change.  Centrilobular and paraseptal emphysematous change. Unchanged 5 mm left lower lobe nodule (image 98; series 10). Interval development of subpleural consolidation within the right lower lobe. Small right pleural effusion. Stable volume loss paddle with right upper lobectomy. Upper Abdomen: No acute process. Renal cysts. Small hiatal hernia. Musculoskeletal: Thoracic spine degenerative changes. No aggressive or acute appearing osseous lesions. Review of the MIP images confirms the above findings. IMPRESSION: 1. No evidence for acute pulmonary embolus. 2. Interval development  of subpleural consolidation within the right lower lobe which may represent infection. Recommend follow-up chest CT in 3 months to ensure resolution. 3. Small right pleural effusion. 4. Emphysema and aortic atherosclerosis. 5. Stable postsurgical changes right hemithorax. Aortic Atherosclerosis (ICD10-I70.0) and Emphysema (ICD10-J43.9). Electronically Signed   By: Lovey Newcomer M.D.   On: 06/02/2019 12:20   Mr Brain Wo Contrast  Result Date: 06/03/2019 CLINICAL DATA:  Encephalopathy EXAM: MRI HEAD WITHOUT CONTRAST TECHNIQUE: Multiplanar, multiecho pulse sequences of the brain and surrounding structures were obtained without intravenous contrast. COMPARISON:  Head CT 06/02/2019 FINDINGS: The examination had to be discontinued prior to completion due to patient altered mental status and inability to comply with the technologist's instructions. Five series are provided. There is no large acute infarct or extra-axial collection. There is generalized volume loss, greater than expected for age. There is diffuse confluent white matter hyperintensity consistent with chronic small vessel ischemia. No midline shift or other mass effect. The major flow voids are normal. Paranasal sinuses are clear. There are bilateral lens replacements. IMPRESSION: 1. Truncated examination due to patient altered mental status and inability to comply with the technologist's  instructions. 2. Within that limitation, no acute ischemia or other acute intracranial abnormality. 3. Age advanced volume loss and chronic small vessel ischemia. Electronically Signed   By: Ulyses Jarred M.D.   On: 06/03/2019 20:22   Dg Chest Port 1 View  Result Date: 06/02/2019 CLINICAL DATA:  Cough. EXAM: PORTABLE CHEST 1 VIEW COMPARISON:  10/10/2018 FINDINGS: Patient slightly rotated to the right. Stable chronic and postsurgical changes over the right lung. Left lung is clear. Cardiomediastinal silhouette and remainder of the exam is unchanged. IMPRESSION: No active disease. Electronically Signed   By: Marin Olp M.D.   On: 06/02/2019 05:32   Vas [place] Lower Extremity Venous (dvt)  Result Date: 06/04/2019  Lower Venous Study Indications: Pain, and Edema.  Limitations: Poor patient cooperation, resticted mobility, and pain. Comparison Study: No prior study. Performing Technologist: Maudry Mayhew MHA, RDMS, RVT, RDCS  Examination Guidelines: A complete evaluation includes B-mode imaging, spectral Doppler, color Doppler, and power Doppler as needed of all accessible portions of each vessel. Bilateral testing is considered an integral part of a complete examination. Limited examinations for reoccurring indications may be performed as noted.  +---------+---------------+---------+-----------+----------+--------------+ RIGHT    CompressibilityPhasicitySpontaneityPropertiesThrombus Aging +---------+---------------+---------+-----------+----------+--------------+ CFV      Full           Yes      Yes                                 +---------+---------------+---------+-----------+----------+--------------+ SFJ      Full                                                        +---------+---------------+---------+-----------+----------+--------------+ FV Prox  Full                                                         +---------+---------------+---------+-----------+----------+--------------+ FV Mid   Full                                                        +---------+---------------+---------+-----------+----------+--------------+  FV Distal                        Yes                                 +---------+---------------+---------+-----------+----------+--------------+ PFV      Full                                                        +---------+---------------+---------+-----------+----------+--------------+ PTV      Full                                                        +---------+---------------+---------+-----------+----------+--------------+ PERO     Full                                                        +---------+---------------+---------+-----------+----------+--------------+ Patient unable to tolerate distal femoral vein compression, however it is patent by color Doppler. Unable to visualize popliteal vein due to technical limitations.  +---------+---------------+---------+-----------+----------+--------------+ LEFT     CompressibilityPhasicitySpontaneityPropertiesThrombus Aging +---------+---------------+---------+-----------+----------+--------------+ CFV      Full           Yes      Yes                                 +---------+---------------+---------+-----------+----------+--------------+ SFJ      Full                                                        +---------+---------------+---------+-----------+----------+--------------+ FV Prox  Full                                                        +---------+---------------+---------+-----------+----------+--------------+ FV Mid   Full                                                        +---------+---------------+---------+-----------+----------+--------------+ FV DistalFull                                                         +---------+---------------+---------+-----------+----------+--------------+ PFV      Full                                                        +---------+---------------+---------+-----------+----------+--------------+  PTV      Full                                                        +---------+---------------+---------+-----------+----------+--------------+ Unable to visualize popliteal vein and peroneal veins due to technical limitations.  Summary: Right: There is no evidence of deep vein thrombosis in the lower extremity. However, portions of this examination were limited- see technologist comments above. Left: There is no evidence of deep vein thrombosis in the lower extremity. However, portions of this examination were limited- see technologist comments above.  *See table(s) above for measurements and observations. Electronically signed by Harold Barban MD on 06/04/2019 at 5:57:41 PM.    Final     DISCHARGE INSTRUCTIONS: Discharge Instructions    Call MD / Call 911   Complete by: As directed    If you experience chest pain or shortness of breath, CALL 911 and be transported to the hospital emergency room.  If you develope a fever above 101 F, pus (white drainage) or increased drainage or redness at the wound, or calf pain, call your surgeon's office.   Call MD / Call 911   Complete by: As directed    If you experience chest pain or shortness of breath, CALL 911 and be transported to the hospital emergency room.  If you develope a fever above 101 F, pus (white drainage) or increased drainage or redness at the wound, or calf pain, call your surgeon's office.   Constipation Prevention   Complete by: As directed    Drink plenty of fluids.  Prune juice may be helpful.  You may use a stool softener, such as Colace (over the counter) 100 mg twice a day.  Use MiraLax (over the counter) for constipation as needed.   Constipation Prevention   Complete by: As directed    Drink plenty of  fluids.  Prune juice may be helpful.  You may use a stool softener, such as Colace (over the counter) 100 mg twice a day.  Use MiraLax (over the counter) for constipation as needed.   Diet - low sodium heart healthy   Complete by: As directed    Diet - low sodium heart healthy   Complete by: As directed    Discharge instructions   Complete by: As directed    INSTRUCTIONS AFTER JOINT REPLACEMENT   Remove items at home which could result in a fall. This includes throw rugs or furniture in walking pathways ICE to the affected joint every three hours while awake for 30 minutes at a time, for at least the first 3-5 days, and then as needed for pain and swelling.  Continue to use ice for pain and swelling. You may notice swelling that will progress down to the foot and ankle.  This is normal after surgery.  Elevate your leg when you are not up walking on it.   Continue to use the breathing machine you got in the hospital (incentive spirometer) which will help keep your temperature down.  It is common for your temperature to cycle up and down following surgery, especially at night when you are not up moving around and exerting yourself.  The breathing machine keeps your lungs expanded and your temperature down.   DIET:  As you were doing prior to hospitalization, we recommend a well-balanced diet.  DRESSING / WOUND CARE / SHOWERING  Keep the surgical dressing until follow up.  The dressing is water proof, so you can shower without any extra covering.  IF THE DRESSING FALLS OFF or the wound gets wet inside, change the dressing with sterile gauze.  Please use good hand washing techniques before changing the dressing.  Do not use any lotions or creams on the incision until instructed by your surgeon.    ACTIVITY  Increase activity slowly as tolerated, but follow the weight bearing instructions below.   No driving for 6 weeks or until further direction given by your physician.  You cannot drive while  taking narcotics.  No lifting or carrying greater than 10 lbs. until further directed by your surgeon. Avoid periods of inactivity such as sitting longer than an hour when not asleep. This helps prevent blood clots.  You may return to work once you are authorized by your doctor.     WEIGHT BEARING   Weight bearing as tolerated with assist device (walker, cane, etc) as directed, use it as long as suggested by your surgeon or therapist, typically at least 4-6 weeks.   EXERCISES  Results after joint replacement surgery are often greatly improved when you follow the exercise, range of motion and muscle strengthening exercises prescribed by your doctor. Safety measures are also important to protect the joint from further injury. Any time any of these exercises cause you to have increased pain or swelling, decrease what you are doing until you are comfortable again and then slowly increase them. If you have problems or questions, call your caregiver or physical therapist for advice.   Rehabilitation is important following a joint replacement. After just a few days of immobilization, the muscles of the leg can become weakened and shrink (atrophy).  These exercises are designed to build up the tone and strength of the thigh and leg muscles and to improve motion. Often times heat used for twenty to thirty minutes before working out will loosen up your tissues and help with improving the range of motion but do not use heat for the first two weeks following surgery (sometimes heat can increase post-operative swelling).   These exercises can be done on a training (exercise) mat, on the floor, on a table or on a bed. Use whatever works the best and is most comfortable for you.    Use music or television while you are exercising so that the exercises are a pleasant break in your day. This will make your life better with the exercises acting as a break in your routine that you can look forward to.   Perform all  exercises about fifteen times, three times per day or as directed.  You should exercise both the operative leg and the other leg as well.   Exercises include:   Quad Sets - Tighten up the muscle on the front of the thigh (Quad) and hold for 5-10 seconds.   Straight Leg Raises - With your knee straight (if you were given a brace, keep it on), lift the leg to 60 degrees, hold for 3 seconds, and slowly lower the leg.  Perform this exercise against resistance later as your leg gets stronger.  Leg Slides: Lying on your back, slowly slide your foot toward your buttocks, bending your knee up off the floor (only go as far as is comfortable). Then slowly slide your foot back down until your leg is flat on the floor again.  Angel Wings: Lying on your  back spread your legs to the side as far apart as you can without causing discomfort.  Hamstring Strength:  Lying on your back, push your heel against the floor with your leg straight by tightening up the muscles of your buttocks.  Repeat, but this time bend your knee to a comfortable angle, and push your heel against the floor.  You may put a pillow under the heel to make it more comfortable if necessary.   A rehabilitation program following joint replacement surgery can speed recovery and prevent re-injury in the future due to weakened muscles. Contact your doctor or a physical therapist for more information on knee rehabilitation.    CONSTIPATION  Constipation is defined medically as fewer than three stools per week and severe constipation as less than one stool per week.  Even if you have a regular bowel pattern at home, your normal regimen is likely to be disrupted due to multiple reasons following surgery.  Combination of anesthesia, postoperative narcotics, change in appetite and fluid intake all can affect your bowels.   YOU MUST use at least one of the following options; they are listed in order of increasing strength to get the job done.  They are all  available over the counter, and you may need to use some, POSSIBLY even all of these options:    Drink plenty of fluids (prune juice may be helpful) and high fiber foods Colace 100 mg by mouth twice a day  Senokot for constipation as directed and as needed Dulcolax (bisacodyl), take with full glass of water  Miralax (polyethylene glycol) once or twice a day as needed.  If you have tried all these things and are unable to have a bowel movement in the first 3-4 days after surgery call either your surgeon or your primary doctor.    If you experience loose stools or diarrhea, hold the medications until you stool forms back up.  If your symptoms do not get better within 1 week or if they get worse, check with your doctor.  If you experience "the worst abdominal pain ever" or develop nausea or vomiting, please contact the office immediately for further recommendations for treatment.   ITCHING:  If you experience itching with your medications, try taking only a single pain pill, or even half a pain pill at a time.  You can also use Benadryl over the counter for itching or also to help with sleep.   TED HOSE STOCKINGS:  Use stockings on both legs until for at least 2 weeks or as directed by physician office. They may be removed at night for sleeping.  MEDICATIONS:  See your medication summary on the "After Visit Summary" that nursing will review with you.  You may have some home medications which will be placed on hold until you complete the course of blood thinner medication.  It is important for you to complete the blood thinner medication as prescribed.  PRECAUTIONS:  If you experience chest pain or shortness of breath - call 911 immediately for transfer to the hospital emergency department.   If you develop a fever greater that 101 F, purulent drainage from wound, increased redness or drainage from wound, foul odor from the wound/dressing, or calf pain - CONTACT YOUR SURGEON.  FOLLOW-UP APPOINTMENTS:  If you do not already have a post-op appointment, please call the office for an appointment to be seen by your surgeon.  Guidelines for how soon to be seen are listed in your "After Visit Summary", but are typically between 1-4 weeks after surgery.  OTHER INSTRUCTIONS:   Knee Replacement:  Do not place pillow under knee, focus on keeping the knee straight while resting. CPM instructions: 0-90 degrees, 2 hours in the morning, 2 hours in the afternoon, and 2 hours in the evening. Place foam block, curve side up under heel at all times except when in CPM or when walking.  DO NOT modify, tear, cut, or change the foam block in any way.  MAKE SURE YOU:  Understand these instructions.  Get help right away if you are not doing well or get worse.    Thank you for letting us be a part of your medical care team.  It is a privilege we respect greatly.  We hope these instructions will help you stay on track for a fast and full recovery!   Discharge instructions   Complete by: As directed    INSTRUCTIONS AFTER JOINT REPLACEMENT   Remove items at home which could result in a fall. This includes throw rugs or furniture in walking pathways ICE to the affected joint every three hours while awake for 30 minutes at a time, for at least the first 3-5 days, and then as needed for pain and swelling.  Continue to use ice for pain and swelling. You may notice swelling that will progress down to the foot and ankle.  This is normal after surgery.  Elevate your leg when you are not up walking on it.   Continue to use the breathing machine you got in the hospital (incentive spirometer) which will help keep your temperature down.  It is common for your temperature to cycle up and down following surgery, especially at night when you are not up moving around and exerting yourself.  The breathing machine keeps your lungs expanded and your temperature down.   DIET:  As you were  doing prior to hospitalization, we recommend a well-balanced diet.  DRESSING / WOUND CARE / SHOWERING  Keep the surgical dressing until follow up.  The dressing is water proof, so you can shower without any extra covering.  IF THE DRESSING FALLS OFF or the wound gets wet inside, change the dressing with sterile gauze.  Please use good hand washing techniques before changing the dressing.  Do not use any lotions or creams on the incision until instructed by your surgeon.    ACTIVITY  Increase activity slowly as tolerated, but follow the weight bearing instructions below.   No driving for 6 weeks or until further direction given by your physician.  You cannot drive while taking narcotics.  No lifting or carrying greater than 10 lbs. until further directed by your surgeon. Avoid periods of inactivity such as sitting longer than an hour when not asleep. This helps prevent blood clots.  You may return to work once you are authorized by your doctor.     WEIGHT BEARING   Weight bearing as tolerated with assist device (walker, cane, etc) as directed, use it as long as suggested by your surgeon or therapist, typically at least 4-6 weeks.   EXERCISES  Results after joint replacement surgery are often greatly improved when you follow the exercise, range of motion and muscle strengthening exercises prescribed by your doctor. Safety measures are  also important to protect the joint from further injury. Any time any of these exercises cause you to have increased pain or swelling, decrease what you are doing until you are comfortable again and then slowly increase them. If you have problems or questions, call your caregiver or physical therapist for advice.   Rehabilitation is important following a joint replacement. After just a few days of immobilization, the muscles of the leg can become weakened and shrink (atrophy).  These exercises are designed to build up the tone and strength of the thigh and leg  muscles and to improve motion. Often times heat used for twenty to thirty minutes before working out will loosen up your tissues and help with improving the range of motion but do not use heat for the first two weeks following surgery (sometimes heat can increase post-operative swelling).   These exercises can be done on a training (exercise) mat, on the floor, on a table or on a bed. Use whatever works the best and is most comfortable for you.    Use music or television while you are exercising so that the exercises are a pleasant break in your day. This will make your life better with the exercises acting as a break in your routine that you can look forward to.   Perform all exercises about fifteen times, three times per day or as directed.  You should exercise both the operative leg and the other leg as well.   Exercises include:   Quad Sets - Tighten up the muscle on the front of the thigh (Quad) and hold for 5-10 seconds.   Straight Leg Raises - With your knee straight (if you were given a brace, keep it on), lift the leg to 60 degrees, hold for 3 seconds, and slowly lower the leg.  Perform this exercise against resistance later as your leg gets stronger.  Leg Slides: Lying on your back, slowly slide your foot toward your buttocks, bending your knee up off the floor (only go as far as is comfortable). Then slowly slide your foot back down until your leg is flat on the floor again.  Angel Wings: Lying on your back spread your legs to the side as far apart as you can without causing discomfort.  Hamstring Strength:  Lying on your back, push your heel against the floor with your leg straight by tightening up the muscles of your buttocks.  Repeat, but this time bend your knee to a comfortable angle, and push your heel against the floor.  You may put a pillow under the heel to make it more comfortable if necessary.   A rehabilitation program following joint replacement surgery can speed recovery and  prevent re-injury in the future due to weakened muscles. Contact your doctor or a physical therapist for more information on knee rehabilitation.    CONSTIPATION  Constipation is defined medically as fewer than three stools per week and severe constipation as less than one stool per week.  Even if you have a regular bowel pattern at home, your normal regimen is likely to be disrupted due to multiple reasons following surgery.  Combination of anesthesia, postoperative narcotics, change in appetite and fluid intake all can affect your bowels.   YOU MUST use at least one of the following options; they are listed in order of increasing strength to get the job done.  They are all available over the counter, and you may need to use some, POSSIBLY even all of these options:  Drink plenty of fluids (prune juice may be helpful) and high fiber foods Colace 100 mg by mouth twice a day  Senokot for constipation as directed and as needed Dulcolax (bisacodyl), take with full glass of water  Miralax (polyethylene glycol) once or twice a day as needed.  If you have tried all these things and are unable to have a bowel movement in the first 3-4 days after surgery call either your surgeon or your primary doctor.    If you experience loose stools or diarrhea, hold the medications until you stool forms back up.  If your symptoms do not get better within 1 week or if they get worse, check with your doctor.  If you experience "the worst abdominal pain ever" or develop nausea or vomiting, please contact the office immediately for further recommendations for treatment.   ITCHING:  If you experience itching with your medications, try taking only a single pain pill, or even half a pain pill at a time.  You can also use Benadryl over the counter for itching or also to help with sleep.   TED HOSE STOCKINGS:  Use stockings on both legs until for at least 2 weeks or as directed by physician office. They may be removed at  night for sleeping.  MEDICATIONS:  See your medication summary on the "After Visit Summary" that nursing will review with you.  You may have some home medications which will be placed on hold until you complete the course of blood thinner medication.  It is important for you to complete the blood thinner medication as prescribed.  PRECAUTIONS:  If you experience chest pain or shortness of breath - call 911 immediately for transfer to the hospital emergency department.   If you develop a fever greater that 101 F, purulent drainage from wound, increased redness or drainage from wound, foul odor from the wound/dressing, or calf pain - CONTACT YOUR SURGEON.                                                   FOLLOW-UP APPOINTMENTS:  If you do not already have a post-op appointment, please call the office for an appointment to be seen by your surgeon.  Guidelines for how soon to be seen are listed in your "After Visit Summary", but are typically between 1-4 weeks after surgery.  OTHER INSTRUCTIONS:   Knee Replacement:  Do not place pillow under knee, focus on keeping the knee straight while resting. CPM instructions: 0-90 degrees, 2 hours in the morning, 2 hours in the afternoon, and 2 hours in the evening. Place foam block, curve side up under heel at all times except when in CPM or when walking.  DO NOT modify, tear, cut, or change the foam block in any way.  MAKE SURE YOU:  Understand these instructions.  Get help right away if you are not doing well or get worse.    Thank you for letting us be a part of your medical care team.  It is a privilege we respect greatly.  We hope these instructions will help you stay on track for a fast and full recovery!   Increase activity slowly as tolerated   Complete by: As directed    Increase activity slowly as tolerated   Complete by: As directed       DISCHARGE MEDICATIONS:  Allergies as of 06/05/2019      Reactions   Boniva [ibandronate Sodium] Other  (See Comments)   UNSPECIFIED REACTION    Lyrica [pregabalin] Nausea And Vomiting   Dizziness and blurred vision as well   Levofloxacin       Medication List    STOP taking these medications   meloxicam 7.5 MG tablet Commonly known as: MOBIC     TAKE these medications   acetaminophen 500 MG tablet Commonly known as: TYLENOL Take 2 tablets (1,000 mg total) by mouth every 6 (six) hours.   albuterol 108 (90 Base) MCG/ACT inhaler Commonly known as: VENTOLIN HFA Inhale 2 puffs into the lungs every 6 (six) hours as needed. What changed:   when to take this  reasons to take this   albuterol (2.5 MG/3ML) 0.083% nebulizer solution Commonly known as: PROVENTIL Take 3 mLs (2.5 mg total) by nebulization every 2 (two) hours as needed for wheezing or shortness of breath. What changed: Another medication with the same name was changed. Make sure you understand how and when to take each.   aluminum-magnesium hydroxide-simethicone 902-409-73 MG/5ML Susp Commonly known as: MAALOX Take 30 mLs by mouth as needed (indigestion).   amLODipine 5 MG tablet Commonly known as: NORVASC Take 5 mg by mouth daily.   aspirin 325 MG EC tablet Take 1 tablet (325 mg total) by mouth 2 (two) times daily.   atorvastatin 20 MG tablet Commonly known as: LIPITOR Take 20 mg by mouth daily.   diazepam 5 MG tablet Commonly known as: VALIUM Take 2.5 mg by mouth every 8 (eight) hours as needed for anxiety (for nerves). Must last 30 days. Filled 11-28-17   Fluticasone-Salmeterol 250-50 MCG/DOSE Aepb Commonly known as: Advair Diskus Inhale 1 puff into the lungs every 12 (twelve) hours.   gabapentin 400 MG capsule Commonly known as: NEURONTIN Take 1 capsule (400 mg total) by mouth 4 (four) times daily.   ketoconazole 2 % cream Commonly known as: NIZORAL Apply 1 application topically daily.   lisinopril 10 MG tablet Commonly known as: ZESTRIL Take 1 tablet (10 mg total) by mouth every morning.    metoCLOPramide 5 MG tablet Commonly known as: REGLAN Take 5 mg by mouth 2 (two) times daily.   metoprolol tartrate 25 MG tablet Commonly known as: LOPRESSOR Take 0.5 tablets (12.5 mg total) by mouth 2 (two) times daily.   omega-3 acid ethyl esters 1 g capsule Commonly known as: LOVAZA Take by mouth 2 (two) times daily.   oxyCODONE 5 MG immediate release tablet Commonly known as: Oxy IR/ROXICODONE Take 1-2 tablets (5-10 mg total) by mouth every 6 (six) hours as needed for moderate pain (pain score 4-6).   pantoprazole 40 MG tablet Commonly known as: PROTONIX Take 1 tablet (40 mg total) by mouth daily at 6 (six) AM.   raloxifene 60 MG tablet Commonly known as: EVISTA Take 60 mg by mouth daily.   sertraline 100 MG tablet Commonly known as: ZOLOFT Take 100 mg by mouth daily.   tiotropium 18 MCG inhalation capsule Commonly known as: SPIRIVA Place 18 mcg into inhaler and inhale daily.   tiZANidine 2 MG tablet Commonly known as: ZANAFLEX Take 1 tablet (2 mg total) by mouth every 6 (six) hours as needed.   traMADol 50 MG tablet Commonly known as: ULTRAM Take 50 mg by mouth 3 (three) times daily as needed.   Trelegy Ellipta 100-62.5-25 MCG/INH Aepb Generic drug: Fluticasone-Umeclidin-Vilant Inhale 1 puff into the lungs daily.   WOMENS  50+ ADVANCED PO Take 1 tablet by mouth daily.            Durable Medical Equipment  (From admission, onward)         Start     Ordered   05/27/19 1158  DME Walker rolling  Once    Question:  Patient needs a walker to treat with the following condition  Answer:  S/P total knee replacement   05/27/19 1157   05/27/19 1158  DME 3 n 1  Once     05/27/19 1157   05/27/19 1158  DME Bedside commode  Once    Question:  Patient needs a bedside commode to treat with the following condition  Answer:  S/P total knee replacement   05/27/19 1157          FOLLOW UP VISIT:    Contact information for follow-up providers    [facility]  Follow up.   Specialty: Home Health Services Contact information: [place] [address] [phone]            Contact information for after-discharge care    Destination    [facility] .   Service: Skilled Nursing Contact information: [place] [phone]                  DISPOSITION:  SNF  CONDITION:  Keenan Bachelor 06/05/2019, 9:10 AM

## 2019-06-05 NOTE — Progress Notes (Signed)
[facility] called back for report, report given to female nurse unable to understand his name. Bethann Punches RN

## 2019-06-11 DIAGNOSIS — Z96659 Presence of unspecified artificial knee joint: Secondary | ICD-10-CM | POA: Insufficient documentation

## 2019-07-09 ENCOUNTER — Emergency Department (HOSPITAL_COMMUNITY)
Admission: EM | Admit: 2019-07-09 | Discharge: 2019-07-09 | Disposition: A | Discharge status: Home / Self Care | Payer: Medicare Other | Attending: Emergency Medicine | Admitting: Emergency Medicine

## 2019-07-09 ENCOUNTER — Encounter (HOSPITAL_COMMUNITY): Payer: Self-pay

## 2019-07-09 ENCOUNTER — Other Ambulatory Visit: Payer: Self-pay

## 2019-07-09 DIAGNOSIS — N183 Chronic kidney disease, stage 3 unspecified: Secondary | ICD-10-CM | POA: Insufficient documentation

## 2019-07-09 DIAGNOSIS — R1110 Vomiting, unspecified: Secondary | ICD-10-CM | POA: Diagnosis present

## 2019-07-09 DIAGNOSIS — R112 Nausea with vomiting, unspecified: Secondary | ICD-10-CM | POA: Diagnosis not present

## 2019-07-09 DIAGNOSIS — I2510 Atherosclerotic heart disease of native coronary artery without angina pectoris: Secondary | ICD-10-CM | POA: Insufficient documentation

## 2019-07-09 DIAGNOSIS — Z8542 Personal history of malignant neoplasm of other parts of uterus: Secondary | ICD-10-CM | POA: Insufficient documentation

## 2019-07-09 DIAGNOSIS — Z87891 Personal history of nicotine dependence: Secondary | ICD-10-CM | POA: Diagnosis not present

## 2019-07-09 DIAGNOSIS — Z85118 Personal history of other malignant neoplasm of bronchus and lung: Secondary | ICD-10-CM | POA: Insufficient documentation

## 2019-07-09 DIAGNOSIS — J449 Chronic obstructive pulmonary disease, unspecified: Secondary | ICD-10-CM | POA: Insufficient documentation

## 2019-07-09 DIAGNOSIS — Z86718 Personal history of other venous thrombosis and embolism: Secondary | ICD-10-CM | POA: Insufficient documentation

## 2019-07-09 DIAGNOSIS — Z7982 Long term (current) use of aspirin: Secondary | ICD-10-CM | POA: Diagnosis not present

## 2019-07-09 DIAGNOSIS — Z79899 Other long term (current) drug therapy: Secondary | ICD-10-CM | POA: Insufficient documentation

## 2019-07-09 DIAGNOSIS — I130 Hypertensive heart and chronic kidney disease with heart failure and stage 1 through stage 4 chronic kidney disease, or unspecified chronic kidney disease: Secondary | ICD-10-CM | POA: Diagnosis not present

## 2019-07-09 DIAGNOSIS — I5021 Acute systolic (congestive) heart failure: Secondary | ICD-10-CM | POA: Diagnosis not present

## 2019-07-09 LAB — COMPREHENSIVE METABOLIC PANEL
ALT: 9 U/L (ref 0–44)
AST: 12 U/L — ABNORMAL LOW (ref 15–41)
Albumin: 3.2 g/dL — ABNORMAL LOW (ref 3.5–5.0)
Alkaline Phosphatase: 61 U/L (ref 38–126)
Anion gap: 10 (ref 5–15)
BUN: 15 mg/dL (ref 8–23)
CO2: 27 mmol/L (ref 22–32)
Calcium: 8.6 mg/dL — ABNORMAL LOW (ref 8.9–10.3)
Chloride: 101 mmol/L (ref 98–111)
Creatinine, Ser: 0.8 mg/dL (ref 0.44–1.00)
GFR calc Af Amer: >60 mL/min (ref >60)
GFR calc non Af Amer: >60 mL/min (ref >60)
Glucose, Bld: 105 mg/dL — ABNORMAL HIGH (ref 70–99)
Potassium: 3.5 mmol/L (ref 3.5–5.1)
Sodium: 138 mmol/L (ref 135–145)
Total Bilirubin: 0.4 mg/dL (ref 0.3–1.2)
Total Protein: 6.9 g/dL (ref 6.5–8.1)

## 2019-07-09 LAB — CBC WITH DIFFERENTIAL/PLATELET
Abs Immature Granulocytes: 0.04 10*3/uL (ref 0.00–0.07)
Basophils Absolute: 0 10*3/uL (ref 0.0–0.1)
Basophils Relative: 0 %
Eosinophils Absolute: 0.1 10*3/uL (ref 0.0–0.5)
Eosinophils Relative: 1 %
HCT: 37.9 % (ref 36.0–46.0)
Hemoglobin: 11.5 g/dL — ABNORMAL LOW (ref 12.0–15.0)
Immature Granulocytes: 0 %
Lymphocytes Relative: 13 %
Lymphs Abs: 1.2 10*3/uL (ref 0.7–4.0)
MCH: 27.3 pg (ref 26.0–34.0)
MCHC: 30.3 g/dL (ref 30.0–36.0)
MCV: 89.8 fL (ref 80.0–100.0)
Monocytes Absolute: 0.6 10*3/uL (ref 0.1–1.0)
Monocytes Relative: 7 %
Neutro Abs: 7.6 10*3/uL (ref 1.7–7.7)
Neutrophils Relative %: 79 %
Platelets: 310 10*3/uL (ref 150–400)
RBC: 4.22 MIL/uL (ref 3.87–5.11)
RDW: 16.4 % — ABNORMAL HIGH (ref 11.5–15.5)
WBC: 9.7 10*3/uL (ref 4.0–10.5)
nRBC: 0 % (ref 0.0–0.2)

## 2019-07-09 LAB — LIPASE, BLOOD: Lipase: 32 U/L (ref 11–51)

## 2019-07-09 MED ORDER — ALUM & MAG HYDROXIDE-SIMETH 200-200-20 MG/5ML PO SUSP
30.0000 mL | Freq: Once | ORAL | Status: AC
  Administered 2019-07-09: 30 mL via ORAL
  Filled 2019-07-09: qty 30

## 2019-07-09 MED ORDER — ONDANSETRON 4 MG PO TBDP: ORAL_TABLET | ORAL | 0 refills | Status: DC

## 2019-07-09 MED ORDER — SODIUM CHLORIDE 0.9 % IV BOLUS
1000.0000 mL | Freq: Once | INTRAVENOUS | Status: AC
  Administered 2019-07-09: 1000 mL via INTRAVENOUS

## 2019-07-09 MED ORDER — ONDANSETRON HCL 4 MG/2ML IJ SOLN
4.0000 mg | Freq: Once | INTRAMUSCULAR | Status: AC
  Administered 2019-07-09: 4 mg via INTRAVENOUS
  Filled 2019-07-09: qty 2

## 2019-07-09 NOTE — ED Triage Notes (Signed)
Patient arrived with complaints of nausea and vomiting over the last 12 hours that started  30 minutes after eating [employer]. Reports intermittent Epigastric pain over the last week.

## 2019-07-09 NOTE — ED Provider Notes (Signed)
[facility] DEPT Provider Note   CSN: [id number] Arrival date & time: 07/09/19  0455     History Chief Complaint  Patient presents with  . Emesis    [name] is a 78 y.o. female.  78 yo F with a chief complaints of nausea and vomiting.  Patient states that she ate a meal at [employer] and about 30 minutes later had nausea and vomiting.  Lasted for a few hours.  Started having some burning to her epigastrium.  She thought she was having a heart attack.  Decided to call 911 and come.  Symptoms actually are much better now.  Feels like her abdomen is little sore from all the vomiting.  Denies exertional symptoms denies chest pain.  Denied diarrhea.  The history is provided by the patient.  Emesis Associated symptoms: abdominal pain   Associated symptoms: no arthralgias, no chills, no fever, no headaches and no myalgias   Illness Severity:  Moderate Onset quality:  Gradual Duration:  2 hours Timing:  Constant Progression:  Worsening Chronicity:  New Associated symptoms: abdominal pain, nausea and vomiting   Associated symptoms: no chest pain, no congestion, no fever, no headaches, no myalgias, no rhinorrhea, no shortness of breath and no wheezing        Past Medical History:  Diagnosis Date  . Adenocarcinoma of right lung, stage 1 ([place]) 07/28/2016  . Anxiety   . Asthma   . Cancer ([place])    uterine  . COPD (chronic obstructive pulmonary disease) ([place])   . Coronary artery disease    mild nonobstructive by 2019 cath  . Cyst    left side of neck  . Depression   . GERD (gastroesophageal reflux disease)   . Hyperlipidemia   . Hypertension   . Low back pain   . Osteoarthritis   . Osteoporosis   . Takotsubo cardiomyopathy    EF recovered to 50-55% by 01/23/18 echo    Patient Active Problem List   Diagnosis Date Noted  . Cough   . Acute metabolic encephalopathy 61/44/3154  . S/P total knee replacement 05/27/2019  . DDD (degenerative  disc disease), 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  . Bilateral renal cysts 05/21/2018  . Hiatal hernia 05/21/2018  . 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 Thoracic radiculitis (Bilateral) (L>R) 05/03/2018  . Chronic thoracic back pain (Left) 05/03/2018  . Injury of peripheral nerves of thorax, sequela 03/22/2018  . Chest pain 03/21/2018  . Rib pain (Left) 03/21/2018  . Chronic rib pain (Right) 03/20/2018  . DDD (degenerative disc disease), lumbar 03/20/2018  . Chronic fracture of pubic ramus, sequela (Right) 03/20/2018  . History of femoral neck fracture, sequela (Left) 03/20/2018    Class: History of  . Lumbar facet arthropathy (Bilateral) 03/20/2018  . Lumbar facet syndrome (Bilateral) 03/20/2018  . Osteoarthritis of facet joint of lumbar spine 03/20/2018  . Spondylosis without myelopathy or radiculopathy, lumbosacral region 03/20/2018  . Other specified dorsopathies, sacral and sacrococcygeal region 03/20/2018  . Chronic pain syndrome 02/26/2018  . Cancer related pain 02/26/2018  . 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 Chronic low back pain (Secondary Area of Pain) (Bilateral) 02/26/2018  . 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 Chronic lower extremity pain [facility] Area of Pain) (Bilateral) (R>L) 02/26/2018  . Chronic hip pain (Fourth Area of Pain) (Bilateral) (R>L) 02/26/2018  . Neurogenic pain 02/26/2018  . Pharmacologic therapy 02/26/2018  . Problems influencing health status 02/26/2018  . Long  term prescription benzodiazepine use 02/26/2018  . Long term prescription opiate use 02/26/2018  . Acute systolic heart failure ([place])   . Non-ST elevation (NSTEMI) myocardial infarction ([place])   . History of uterine cancer 12/08/2017  . Generalized anxiety disorder 12/08/2017  . GERD (gastroesophageal reflux disease) 12/08/2017  . Substernal chest pain 12/08/2017  . Elevated brain natriuretic peptide (BNP) level 12/08/2017  .  Elevated troponin 12/08/2017  . Embedded tick of upper back excluding scapular region 12/08/2017  . Chronic sacroiliac joint pain 06/21/2017  . Disorder of skeletal system 06/21/2017  . Other long term (current) drug therapy 06/21/2017  . Other specified health status 06/21/2017  . Chronic chest wall pain (Primary Area of Pain) 06/21/2017  . Chronic post-thoracotomy pain 03/03/2017  . 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 Adenocarcinoma of right lung, stage 1 ([place]) 07/28/2016  . S/P lobectomy of lung 05/09/2016  . DVT (deep venous thrombosis) ([place]) 04/26/2016  . Heart murmur   . Acute exacerbation of chronic obstructive pulmonary disease (COPD) ([place]) 08/29/2015  . Essential hypertension 08/29/2015  . Hypercholesterolemia 08/29/2015  . CKD (chronic kidney disease), stage III ([place]) 08/29/2015  . COPD (chronic obstructive pulmonary disease) ([place]) 04/27/2015  . Acute on chronic respiratory failure with hypoxia ([place]) 04/27/2015  . Infected sebaceous cyst 08/16/2011    Past Surgical History:  Procedure Laterality Date  . ANKLE SURGERY Right    horse accident  . APPENDECTOMY    . BACK SURGERY    . CHOLECYSTECTOMY    . EYE SURGERY     lense implant  . FRACTURE SURGERY    . HIP SURGERY  01/2010   Left Hip   . INNER EAR SURGERY Bilateral 1970   related to severe ear infections  . KNEE SURGERY     Right knee  . LEFT HEART CATH AND CORONARY ANGIOGRAPHY N/A 12/11/2017   Procedure: LEFT HEART CATH AND CORONARY ANGIOGRAPHY;  Surgeon: Jettie Booze, MD;  Location: [facility];  Service: Cardiovascular;  Laterality: N/A;  . LOBECTOMY Right 05/09/2016   Procedure: RIGHT UPPER LOBECTOMY;  Surgeon: [name], MD;  Location: [facility];  Service: Thoracic;  Laterality: Right;  . MASS EXCISION  08/25/2011   Procedure: EXCISION MASS;  Surgeon: Harl Bowie, MD;  Location: [facility];  Service: General;  Laterality: N/A;  excision left neck  mass  . TOTAL ABDOMINAL HYSTERECTOMY    . TOTAL KNEE ARTHROPLASTY Right 05/27/2019   Procedure: TOTAL KNEE ARTHROPLASTY;  Surgeon: Vickey Huger, MD;  Location: [facility];  Service: Orthopedics;  Laterality: Right;  75 mins needed for length of case  . VIDEO ASSISTED THORACOSCOPY (VATS)/WEDGE RESECTION Right 05/09/2016   Procedure: VIDEO ASSISTED THORACOSCOPY (VATS)/WEDGE RESECTION;  Surgeon: [name], MD;  Location: [facility];  Service: Thoracic;  Laterality: Right;     OB History   No obstetric history on file.     Family History  Problem Relation Age of Onset  . Heart disease Mother   . Heart disease Father   . Cancer Sister        #1, breast  . Heart disease Brother        had CABG    Social History   Tobacco Use  . Smoking status: Former Smoker    Packs/day: 2.00    Years: 51.00    Pack years: 102.00    Types: Cigarettes    Quit date: 05/04/2012    Years since quitting: 7.1  .  Smokeless tobacco: Never Used  Substance Use Topics  . Alcohol use: No    Alcohol/week: 0.0 standard drinks  . Drug use: No    Home Medications Prior to Admission medications   Medication Sig Start Date End Date Taking? Authorizing Provider  acetaminophen (TYLENOL) 500 MG tablet Take 2 tablets (1,000 mg total) by mouth every 6 (six) hours. 05/28/19   Donia Ast, PA  albuterol (PROVENTIL HFA;VENTOLIN HFA) 108 (90 BASE) MCG/ACT inhaler Inhale 2 puffs into the lungs every 6 (six) hours as needed. Patient taking differently: Inhale 2 puffs into the lungs every 4 (four) hours as needed for wheezing or shortness of breath.  04/28/15   Hosie Poisson, MD  albuterol (PROVENTIL) (2.5 MG/3ML) 0.083% nebulizer solution Take 3 mLs (2.5 mg total) by nebulization every 2 (two) hours as needed for wheezing or shortness of breath. 04/28/15   Hosie Poisson, MD  aluminum-magnesium hydroxide-simethicone (MAALOX) 244-010-27 MG/5ML SUSP Take 30 mLs by mouth as needed (indigestion).    [provider]  amLODipine (NORVASC) 5 MG tablet Take 5 mg by mouth daily.    [provider]  aspirin EC 325 MG EC tablet Take 1 tablet (325 mg total) by mouth 2 (two) times daily. 05/28/19   Donia Ast, PA  atorvastatin (LIPITOR) 20 MG tablet Take 20 mg by mouth daily.     [provider]  diazepam (VALIUM) 5 MG tablet Take 0.5 tablets (2.5 mg total) by mouth every 8 (eight) hours as needed for anxiety (for nerves). Must last 30 days. Filled 11-28-17 06/05/19   Donia Ast, PA  Fluticasone-Salmeterol (ADVAIR DISKUS) 250-50 MCG/DOSE AEPB Inhale 1 puff into the lungs every 12 (twelve) hours. 08/30/15   Debbe Odea, MD  gabapentin (NEURONTIN) 400 MG capsule Take 1 capsule (400 mg total) by mouth 4 (four) times daily. 05/24/18 05/21/19  Vevelyn Francois, NP  ketoconazole (NIZORAL) 2 % cream Apply 1 application topically daily. 05/14/19   [provider]  lisinopril (PRINIVIL,ZESTRIL) 10 MG tablet Take 1 tablet (10 mg total) by mouth every morning. 12/12/17   Domenic Polite, MD  metoCLOPramide (REGLAN) 5 MG tablet Take 5 mg by mouth 2 (two) times daily.     [provider]  metoprolol tartrate (LOPRESSOR) 25 MG tablet Take 0.5 tablets (12.5 mg total) by mouth 2 (two) times daily. 12/12/17   Domenic Polite, MD  Multiple Vitamins-Minerals (WOMENS 50+ ADVANCED PO) Take 1 tablet by mouth daily.    [provider]  omega-3 acid ethyl esters (LOVAZA) 1 g capsule Take by mouth 2 (two) times daily.    [provider]  ondansetron (ZOFRAN ODT) 4 MG disintegrating tablet 4mg  ODT q4 hours prn nausea/vomit 07/09/19   Deno Etienne, DO  pantoprazole (PROTONIX) 40 MG tablet Take 1 tablet (40 mg total) by mouth daily at 6 (six) AM. 04/28/15   Hosie Poisson, MD  raloxifene (EVISTA) 60 MG tablet Take 60 mg by mouth daily.    [provider]  sertraline (ZOLOFT) 100 MG tablet Take 100 mg by mouth daily.    [provider]  tiotropium  (SPIRIVA) 18 MCG inhalation capsule Place 18 mcg into inhaler and inhale daily.      [provider]  tiZANidine (ZANAFLEX) 2 MG tablet Take 1 tablet (2 mg total) by mouth every 6 (six) hours as needed. 05/28/19   Donia Ast, PA  traMADol (ULTRAM) 50 MG tablet Take 1 tablet (50 mg total) by mouth 3 (three) times  daily as needed. 06/05/19   Donia Ast, PA  TRELEGY ELLIPTA 100-62.5-25 MCG/INH AEPB Inhale 1 puff into the lungs daily. 05/14/19   [provider]    Allergies    Boniva [ibandronate sodium], Lyrica [pregabalin], and Levofloxacin  Review of Systems   Review of Systems  Constitutional: Negative for chills and fever.  HENT: Negative for congestion and rhinorrhea.   Eyes: Negative for redness and visual disturbance.  Respiratory: Negative for shortness of breath and wheezing.   Cardiovascular: Negative for chest pain and palpitations.  Gastrointestinal: Positive for abdominal pain, nausea and vomiting.  Genitourinary: Negative for dysuria and urgency.  Musculoskeletal: Negative for arthralgias and myalgias.  Skin: Negative for pallor and wound.  Neurological: Negative for dizziness and headaches.    Physical Exam Updated Vital Signs BP (!) 191/81 Comment: Patient due for BP meds, provider made aware   Pulse 78   Temp 98.1 F (36.7 C) (Oral)   Resp 20   SpO2 96%   Physical Exam Vitals and nursing note reviewed.  Constitutional:      General: She is not in acute distress.    Appearance: She is well-developed. She is not diaphoretic.  HENT:     Head: Normocephalic and atraumatic.  Eyes:     Pupils: Pupils are equal, round, and reactive to light.  Cardiovascular:     Rate and Rhythm: Normal rate and regular rhythm.     Heart sounds: No murmur. No friction rub. No gallop.   Pulmonary:     Effort: Pulmonary effort is normal.     Breath sounds: No wheezing or rales.  Abdominal:     General: There is no distension.     Palpations:  Abdomen is soft.     Tenderness: There is no abdominal tenderness. There is no guarding.     Comments: Benign abdominal exam  Musculoskeletal:        General: No tenderness.     Cervical back: Normal range of motion and neck supple.  Skin:    General: Skin is warm and dry.  Neurological:     Mental Status: She is alert and oriented to person, place, and time.  Psychiatric:        Behavior: Behavior normal.     ED Results / Procedures / Treatments   Labs (all labs ordered are listed, but only abnormal results are displayed) Labs Reviewed  CBC WITH DIFFERENTIAL/PLATELET - Abnormal; Notable for the following components:      Result Value   Hemoglobin 11.5 (*)    RDW 16.4 (*)    All other components within normal limits  COMPREHENSIVE METABOLIC PANEL - Abnormal; Notable for the following components:   Glucose, Bld 105 (*)    Calcium 8.6 (*)    Albumin 3.2 (*)    AST 12 (*)    All other components within normal limits  LIPASE, BLOOD    EKG EKG Interpretation  Date/Time:  Tuesday July 09 2019 05:12:03 EST Ventricular Rate:  80 PR Interval:    QRS Duration: 90 QT Interval:  408 QTC Calculation: 471 R Axis:   66 Text Interpretation: Sinus rhythm Baseline wander in lead(s) V6 No significant change since last tracing Confirmed by Deno Etienne [id number]) on 07/09/2019 5:48:52 AM   Radiology No results found.  Procedures Procedures (including critical care time)  Medications Ordered in ED Medications  sodium chloride 0.9 % bolus 1,000 mL (0 mLs Intravenous Stopped 07/09/19 0614)  ondansetron (ZOFRAN) injection 4 mg (4  mg Intravenous Given 07/09/19 0539)  alum & mag hydroxide-simeth (MAALOX/MYLANTA) 200-200-20 MG/5ML suspension 30 mL (30 mLs Oral Given 07/09/19 0540)    ED Course  I have reviewed the triage vital signs and the nursing notes.  Pertinent labs & imaging results that were available during my care of the patient were reviewed by me and considered in my  medical decision making (see chart for details).    MDM Rules/Calculators/A&P                      78 yo F with a chief complaints of nausea and vomiting after eating at [employer].  This resolved and she feels better.  Benign abdominal exam.  Will obtain abdominal labs oral trial and reassess.  Labwork unremarkable.  LFT lipase normal.  Patient tolerating PO. D/c home.   6:56 AM:  I have discussed the diagnosis/risks/treatment options with the patient and believe the pt to be eligible for discharge home to follow-up with PCP. We also discussed returning to the ED immediately if new or worsening sx occur. We discussed the sx which are most concerning (e.g., sudden worsening pain, fever, inability to tolerate by mouth) that necessitate immediate return. Medications administered to the patient during their visit and any new prescriptions provided to the patient are listed below.  Medications given during this visit Medications  sodium chloride 0.9 % bolus 1,000 mL (0 mLs Intravenous Stopped 07/09/19 0614)  ondansetron (ZOFRAN) injection 4 mg (4 mg Intravenous Given 07/09/19 0539)  alum & mag hydroxide-simeth (MAALOX/MYLANTA) 200-200-20 MG/5ML suspension 30 mL (30 mLs Oral Given 07/09/19 0540)     The patient appears reasonably screen and/or stabilized for discharge and I doubt any other medical condition or other [facility] requiring further screening, evaluation, or treatment in the ED at this time prior to discharge.     Final Clinical Impression(s) / ED Diagnoses Final diagnoses:  Nausea and vomiting in adult    Rx / DC Orders ED Discharge Orders         Ordered    ondansetron (ZOFRAN ODT) 4 MG disintegrating tablet     07/09/19 0625           Deno Etienne, DO 07/09/19 [phone]

## 2019-07-09 NOTE — Discharge Instructions (Signed)
Return for worsening abdominal pain, fever, inability to eat or drink.

## 2019-09-14 ENCOUNTER — Emergency Department (HOSPITAL_COMMUNITY): Payer: Medicare Other

## 2019-09-14 ENCOUNTER — Ambulatory Visit: Payer: Medicare Other

## 2019-09-14 ENCOUNTER — Other Ambulatory Visit: Payer: Self-pay

## 2019-09-14 ENCOUNTER — Emergency Department (HOSPITAL_COMMUNITY)
Admission: EM | Admit: 2019-09-14 | Discharge: 2019-09-14 | Disposition: A | Discharge status: Home / Self Care | Payer: Medicare Other | Attending: Emergency Medicine | Admitting: Emergency Medicine

## 2019-09-14 ENCOUNTER — Encounter (HOSPITAL_COMMUNITY): Payer: Self-pay | Admitting: Emergency Medicine

## 2019-09-14 DIAGNOSIS — Z8542 Personal history of malignant neoplasm of other parts of uterus: Secondary | ICD-10-CM | POA: Insufficient documentation

## 2019-09-14 DIAGNOSIS — Z87891 Personal history of nicotine dependence: Secondary | ICD-10-CM | POA: Diagnosis not present

## 2019-09-14 DIAGNOSIS — I130 Hypertensive heart and chronic kidney disease with heart failure and stage 1 through stage 4 chronic kidney disease, or unspecified chronic kidney disease: Secondary | ICD-10-CM | POA: Diagnosis not present

## 2019-09-14 DIAGNOSIS — R531 Weakness: Secondary | ICD-10-CM | POA: Insufficient documentation

## 2019-09-14 DIAGNOSIS — I2510 Atherosclerotic heart disease of native coronary artery without angina pectoris: Secondary | ICD-10-CM | POA: Diagnosis not present

## 2019-09-14 DIAGNOSIS — Z85118 Personal history of other malignant neoplasm of bronchus and lung: Secondary | ICD-10-CM | POA: Insufficient documentation

## 2019-09-14 DIAGNOSIS — R630 Anorexia: Secondary | ICD-10-CM | POA: Diagnosis not present

## 2019-09-14 DIAGNOSIS — I5032 Chronic diastolic (congestive) heart failure: Secondary | ICD-10-CM | POA: Insufficient documentation

## 2019-09-14 DIAGNOSIS — Z86718 Personal history of other venous thrombosis and embolism: Secondary | ICD-10-CM | POA: Diagnosis not present

## 2019-09-14 DIAGNOSIS — N183 Chronic kidney disease, stage 3 unspecified: Secondary | ICD-10-CM | POA: Insufficient documentation

## 2019-09-14 DIAGNOSIS — J449 Chronic obstructive pulmonary disease, unspecified: Secondary | ICD-10-CM | POA: Diagnosis not present

## 2019-09-14 DIAGNOSIS — Z79899 Other long term (current) drug therapy: Secondary | ICD-10-CM | POA: Diagnosis not present

## 2019-09-14 DIAGNOSIS — Z7982 Long term (current) use of aspirin: Secondary | ICD-10-CM | POA: Diagnosis not present

## 2019-09-14 DIAGNOSIS — R001 Bradycardia, unspecified: Secondary | ICD-10-CM | POA: Diagnosis not present

## 2019-09-14 LAB — URINALYSIS, ROUTINE W REFLEX MICROSCOPIC
Bilirubin Urine: NEGATIVE
Glucose, UA: NEGATIVE mg/dL
Hgb urine dipstick: NEGATIVE
Ketones, ur: NEGATIVE mg/dL
Leukocytes,Ua: NEGATIVE
Nitrite: NEGATIVE
Protein, ur: NEGATIVE mg/dL
Specific Gravity, Urine: 1.008 (ref 1.005–1.030)
pH: 6 (ref 5.0–8.0)

## 2019-09-14 LAB — DIFFERENTIAL
Abs Immature Granulocytes: 0.02 10*3/uL (ref 0.00–0.07)
Basophils Absolute: 0.1 10*3/uL (ref 0.0–0.1)
Basophils Relative: 1 %
Eosinophils Absolute: 0.2 10*3/uL (ref 0.0–0.5)
Eosinophils Relative: 3 %
Immature Granulocytes: 0 %
Lymphocytes Relative: 23 %
Lymphs Abs: 1.4 10*3/uL (ref 0.7–4.0)
Monocytes Absolute: 0.5 10*3/uL (ref 0.1–1.0)
Monocytes Relative: 8 %
Neutro Abs: 4 10*3/uL (ref 1.7–7.7)
Neutrophils Relative %: 65 %

## 2019-09-14 LAB — COMPREHENSIVE METABOLIC PANEL
ALT: 10 U/L (ref 0–44)
AST: 16 U/L (ref 15–41)
Albumin: 3.1 g/dL — ABNORMAL LOW (ref 3.5–5.0)
Alkaline Phosphatase: 70 U/L (ref 38–126)
Anion gap: 9 (ref 5–15)
BUN: 17 mg/dL (ref 8–23)
CO2: 24 mmol/L (ref 22–32)
Calcium: 8.4 mg/dL — ABNORMAL LOW (ref 8.9–10.3)
Chloride: 103 mmol/L (ref 98–111)
Creatinine, Ser: 1.28 mg/dL — ABNORMAL HIGH (ref 0.44–1.00)
GFR calc Af Amer: 47 mL/min — ABNORMAL LOW (ref >60)
GFR calc non Af Amer: 41 mL/min — ABNORMAL LOW (ref >60)
Glucose, Bld: 101 mg/dL — ABNORMAL HIGH (ref 70–99)
Potassium: 4.5 mmol/L (ref 3.5–5.1)
Sodium: 136 mmol/L (ref 135–145)
Total Bilirubin: 0.4 mg/dL (ref 0.3–1.2)
Total Protein: 6.9 g/dL (ref 6.5–8.1)

## 2019-09-14 LAB — CBC
HCT: 33.6 % — ABNORMAL LOW (ref 36.0–46.0)
Hemoglobin: 10.2 g/dL — ABNORMAL LOW (ref 12.0–15.0)
MCH: 28.3 pg (ref 26.0–34.0)
MCHC: 30.4 g/dL (ref 30.0–36.0)
MCV: 93.1 fL (ref 80.0–100.0)
Platelets: 320 10*3/uL (ref 150–400)
RBC: 3.61 MIL/uL — ABNORMAL LOW (ref 3.87–5.11)
RDW: 15.6 % — ABNORMAL HIGH (ref 11.5–15.5)
WBC: 6.2 10*3/uL (ref 4.0–10.5)
nRBC: 0 % (ref 0.0–0.2)

## 2019-09-14 LAB — ETHANOL: Alcohol, Ethyl (B): <10 mg/dL (ref <10)

## 2019-09-14 LAB — RAPID URINE DRUG SCREEN, HOSP PERFORMED
Amphetamines: NOT DETECTED
Barbiturates: NOT DETECTED
Benzodiazepines: POSITIVE — AB
Cocaine: NOT DETECTED
Opiates: NOT DETECTED
Tetrahydrocannabinol: NOT DETECTED

## 2019-09-14 LAB — POC OCCULT BLOOD, ED: Fecal Occult Bld: NEGATIVE

## 2019-09-14 LAB — APTT: aPTT: 29 seconds (ref 24–36)

## 2019-09-14 LAB — PROTIME-INR
INR: 1 (ref 0.8–1.2)
Prothrombin Time: 13.4 seconds (ref 11.4–15.2)

## 2019-09-14 LAB — CBG MONITORING, ED: Glucose-Capillary: 89 mg/dL (ref 70–99)

## 2019-09-14 IMAGING — CT CT HEAD W/O CM
4 series · 16 of 47 positions shown, 18 images · non-contrast
Comparison: CT head dated [DATE]

CLINICAL DATA: Focal neurologic deficit-weakness and
lightheadedness this morning.

EXAM:
CT HEAD WITHOUT CONTRAST
TECHNIQUE: Contiguous axial images were obtained from the base of the skull
through the vertex without intravenous contrast.

[Series 3: head without · axial · non-contrast · 0.43mm/px · z∈[-118,+2]mm · 7 of 33 slices shown, 9 images]
[im 5/33  brain]
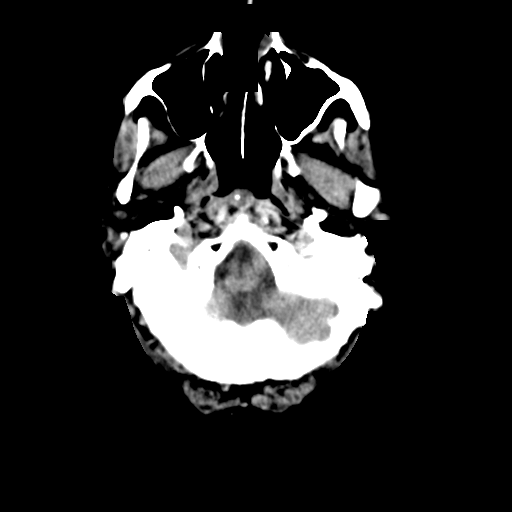
[im 5/33  bone]
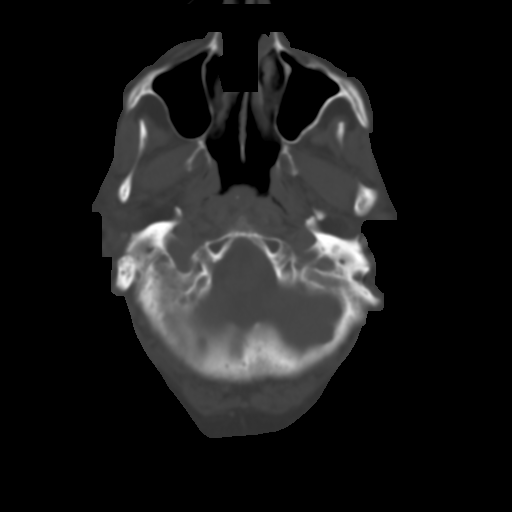
[im 9/33  brain]
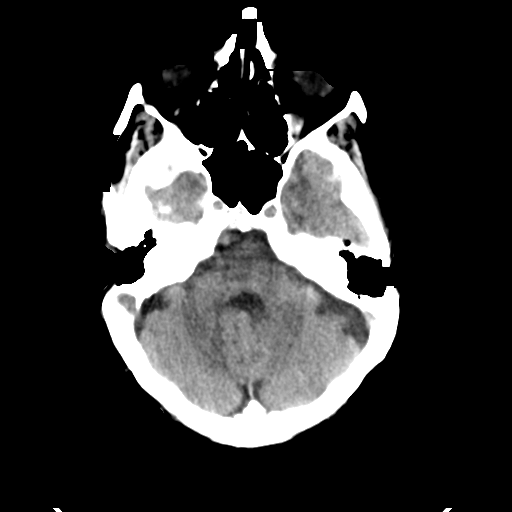
[im 13/33  brain]
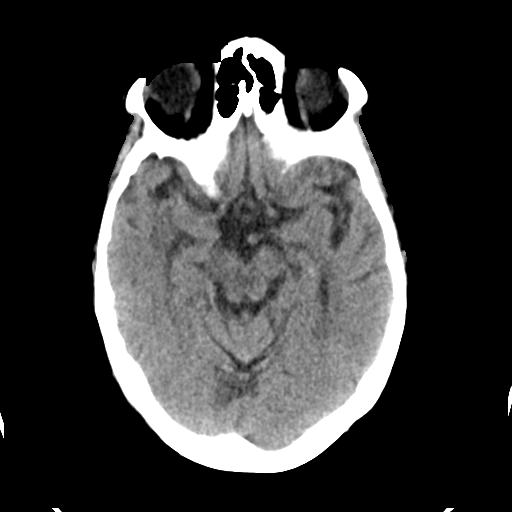
[im 17/33  brain]
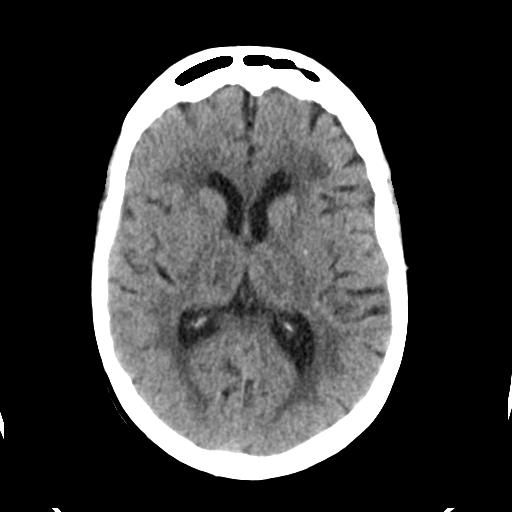
[im 21/33  brain]
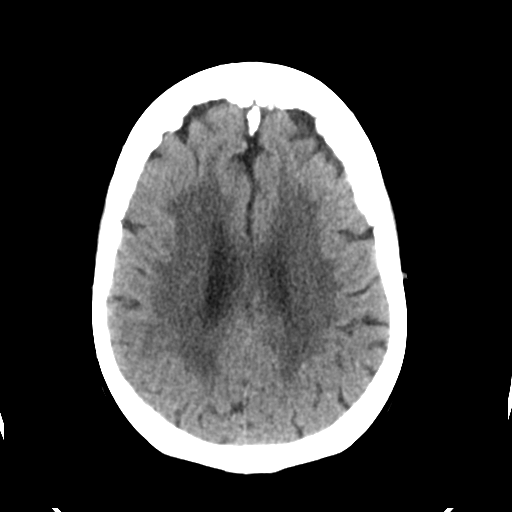
[im 21/33  bone]
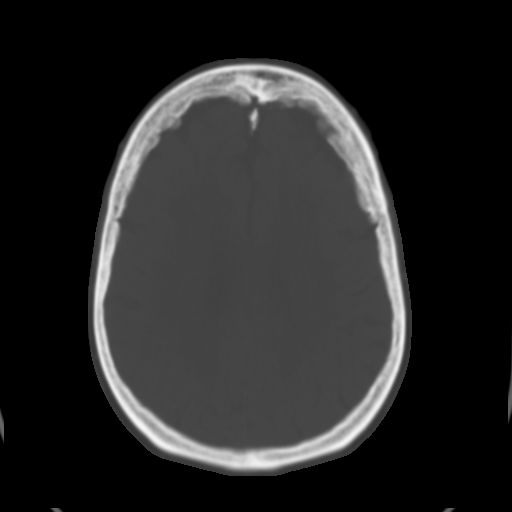
[im 25/33  brain]
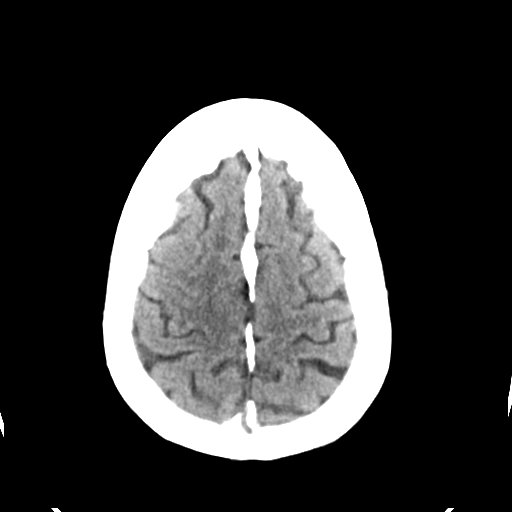
[im 29/33  brain]
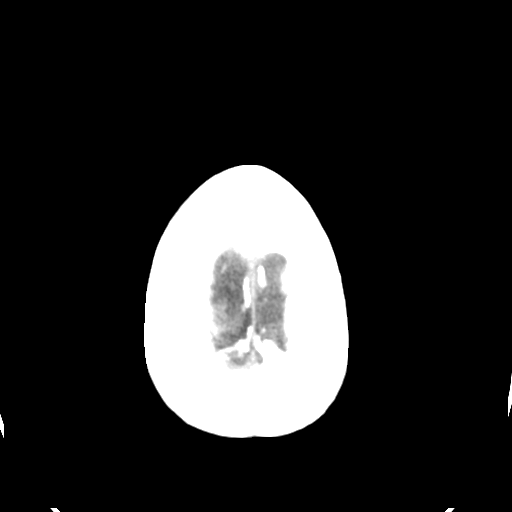

[Series 4: head bone · axial · 0.43mm/px · z∈[-122,-90]mm · 3 of 83 slices shown]
[im 9/83  bone]
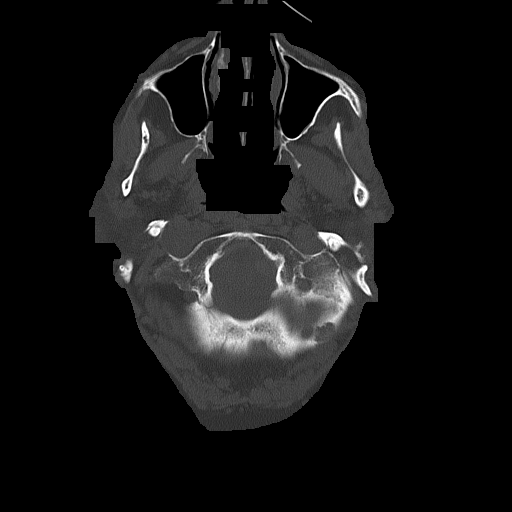
[im 17/83  bone]
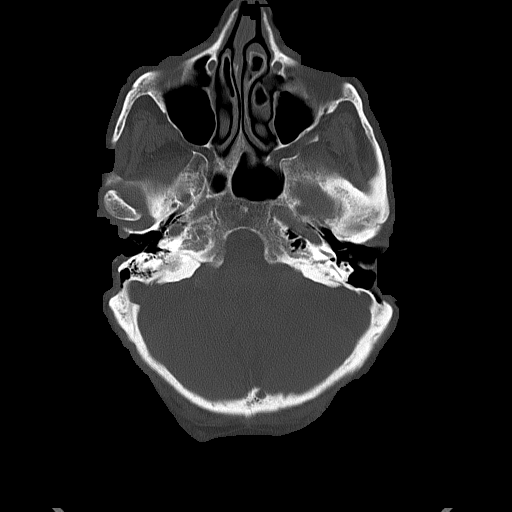
[im 25/83  bone]
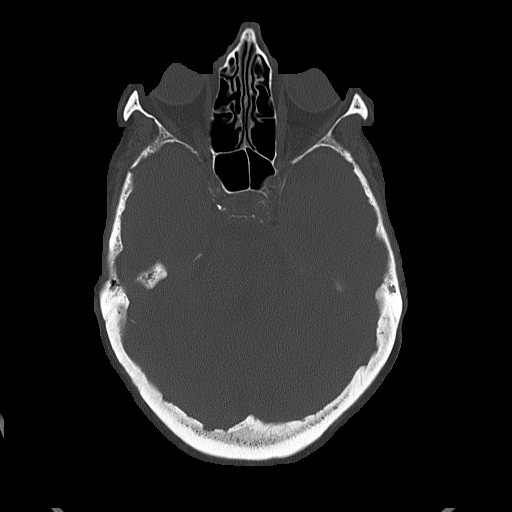

[Series 5: head without cor · coronal · non-contrast · 0.30mm/px · 3 of 65 slices shown]
[im 22/65  brain]
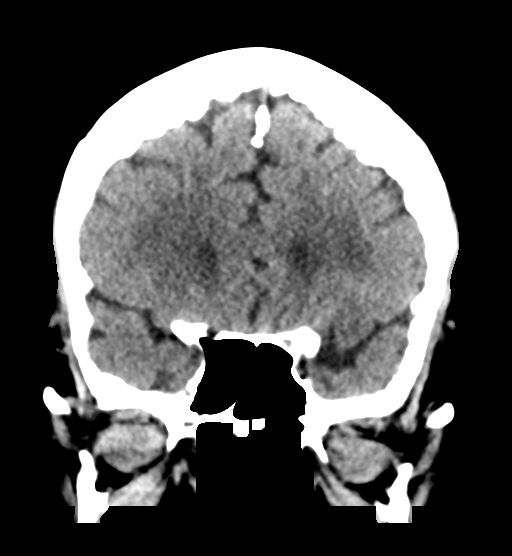
[im 29/65  brain]
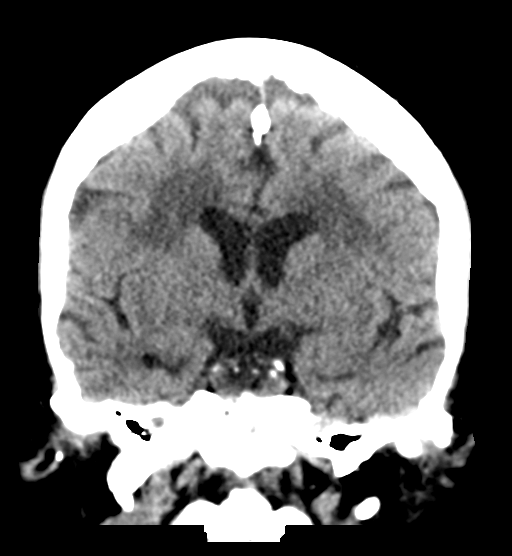
[im 36/65  brain]
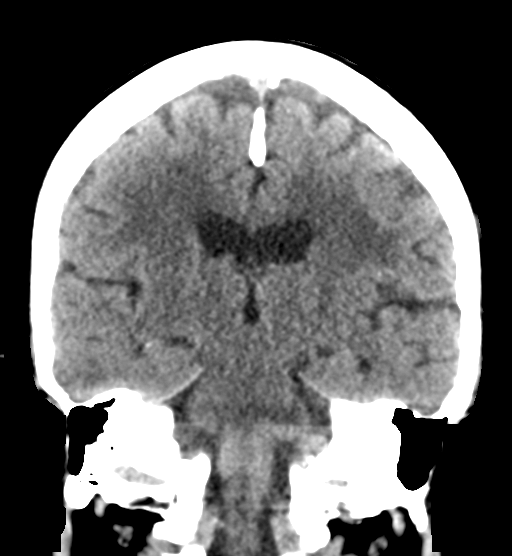

[Series 6: head without sag · sagittal · non-contrast · 0.32mm/px · 3 of 51 slices shown]
[im 17/51  brain]
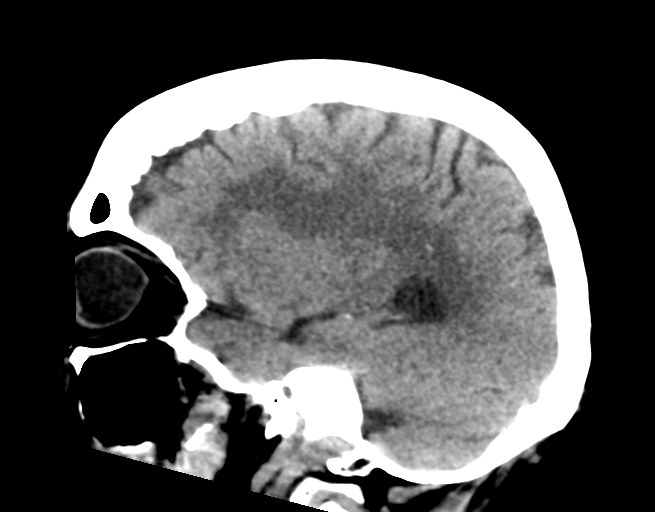
[im 26/51  brain]
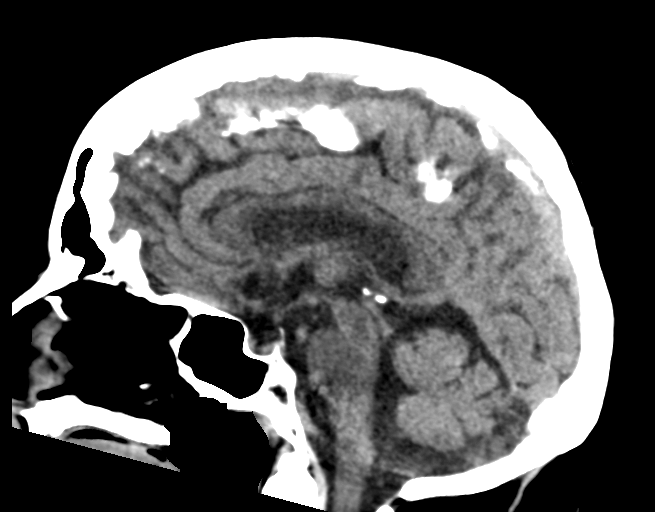
[im 34/51  brain]
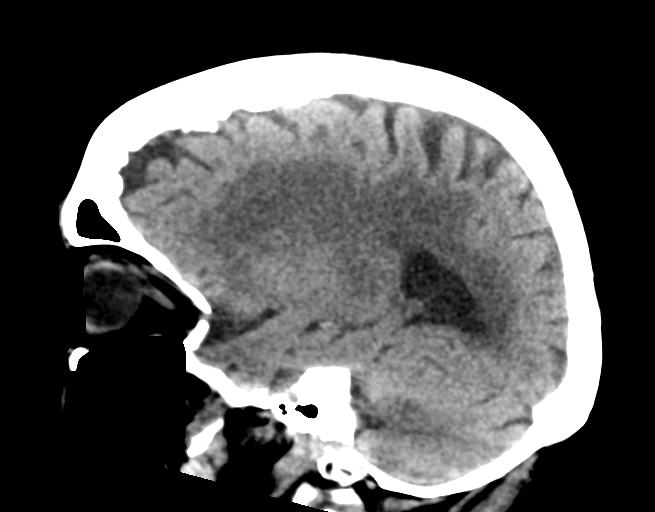

[16 of 47 positions shown; findings below may reference images not displayed]

FINDINGS: Brain: No evidence of acute infarction, hemorrhage, hydrocephalus,
extra-axial collection or mass lesion/mass effect. There is mild
cerebral volume loss with associated ex vacuo dilatation.
Periventricular white matter hypoattenuation likely represents
chronic small vessel ischemic disease.

Vascular: There are vascular calcifications in the carotid siphons.

Skull: Normal. Negative for fracture. Bilateral mastoidectomies are
noted.

Sinuses/Orbits: No acute finding.

Other: None.
IMPRESSION: No acute intracranial process.

## 2019-09-14 IMAGING — DX DG CHEST 1V PORT
1 series · 1 of 1 positions shown · non-contrast
Comparison: [DATE]

CLINICAL DATA: Weakness and lightheadedness

EXAM:
PORTABLE CHEST 1 VIEW

[chest]
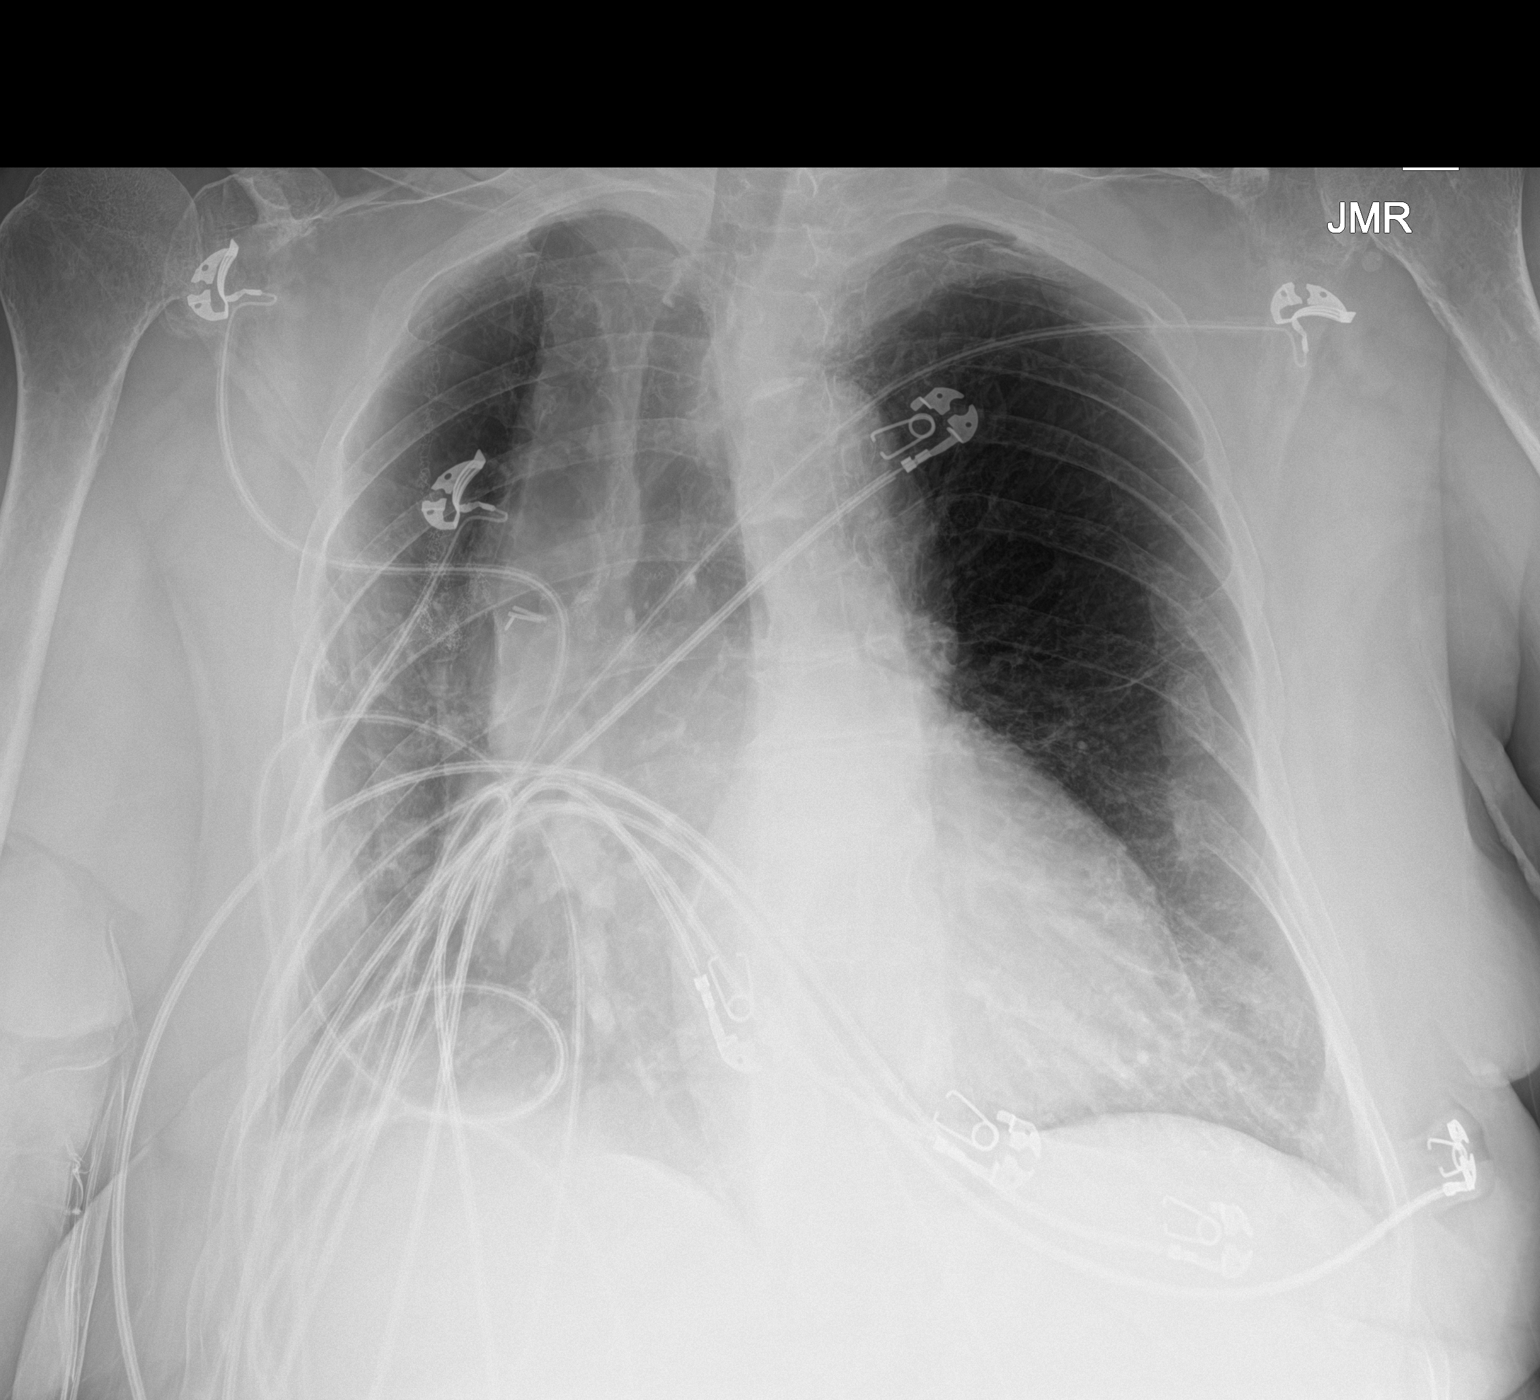

[1 of 1 positions shown; findings below may reference images not displayed]

FINDINGS: Chronic volume loss on the right where there has been prior surgery.
Emphysema. Remote left rib fractures. Chronic cardiomegaly.
IMPRESSION: Stable postoperative chest.  No acute finding.

## 2019-09-14 MED ORDER — SODIUM CHLORIDE 0.9 % IV BOLUS
1000.0000 mL | Freq: Once | INTRAVENOUS | Status: AC
  Administered 2019-09-14: 11:00:00 1000 mL via INTRAVENOUS

## 2019-09-14 NOTE — ED Notes (Addendum)
Pt was able to walk from room to bathroom without assistance, with no apparent difficulty, and no reported pain.

## 2019-09-14 NOTE — ED Notes (Signed)
Pt taken to CT.

## 2019-09-14 NOTE — ED Triage Notes (Signed)
Pt in from home with c/o weakness and felt lightheaded when she woke this am. BP 98 palpated for PTAR, 84/65 on arrival. HR in 40's. Hx of HTN, reports taking all her BP meds this am. Has not eaten since yesterday, CBG 89. Denies any cp, n/v or sob

## 2019-09-14 NOTE — ED Notes (Signed)
Pt ambulated without any problems

## 2019-09-14 NOTE — Discharge Instructions (Signed)
Do not take Lopressor tonight or tomorrow. Recheck with your primary care provider Monday morning to decide whether or not to restart at full dose. Drink plenty of fluids.  Return to the emergency department if you are worse at any time.

## 2019-09-14 NOTE — ED Provider Notes (Addendum)
[facility] EMERGENCY DEPARTMENT Provider Note   CSN: [id number] Arrival date & time: 09/14/19  1019     History Chief Complaint  Patient presents with  . Weakness  . Dizziness    [name] is a 79 y.o. female.  HPI     79 yo female ho lung ca, copd, cad presents today with generalized weakness.  She reports some decreased appetite over the last few days but no other complaints.  Taking meds as prescribed. No fever, chest pain, n/v/ or diarrhea. No uti symptoms- no complaints of frequency, burning or pain.     Past Medical History:  Diagnosis Date  . Adenocarcinoma of right lung, stage 1 ([place]) 07/28/2016  . Anxiety   . Asthma   . Cancer ([place])    uterine  . COPD (chronic obstructive pulmonary disease) ([place])   . Coronary artery disease    mild nonobstructive by 2019 cath  . Cyst    left side of neck  . Depression   . GERD (gastroesophageal reflux disease)   . Hyperlipidemia   . Hypertension   . Low back pain   . Osteoarthritis   . Osteoporosis   . Takotsubo cardiomyopathy    EF recovered to 50-55% by 01/23/18 echo    Patient Active Problem List   Diagnosis Date Noted  . Cough   . Acute metabolic encephalopathy 40/98/1191  . S/P total knee replacement 05/27/2019  . 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 Bilateral renal cysts 05/21/2018  . Hiatal hernia 05/21/2018  . 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 Thoracic radiculitis (Bilateral) (L>R) 05/03/2018  . Chronic thoracic back pain (Left) 05/03/2018  . Injury of peripheral nerves of thorax, sequela 03/22/2018  . Chest pain 03/21/2018  . Rib pain (Left) 03/21/2018  . Chronic rib pain (Right) 03/20/2018  . DDD (degenerative disc disease), lumbar 03/20/2018  . Chronic fracture of pubic ramus, sequela (Right) 03/20/2018  . History of femoral neck fracture, sequela (Left) 03/20/2018    Class: History of  . Lumbar facet arthropathy (Bilateral) 03/20/2018  . Lumbar  facet syndrome (Bilateral) 03/20/2018  . Osteoarthritis of facet joint of lumbar spine 03/20/2018  . Spondylosis without myelopathy or radiculopathy, lumbosacral region 03/20/2018  . Other specified dorsopathies, sacral and sacrococcygeal region 03/20/2018  . Chronic pain syndrome 02/26/2018  . Cancer related pain 02/26/2018  . 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 Chronic low back pain (Secondary Area of Pain) (Bilateral) 02/26/2018  . 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 Chronic lower extremity pain [facility] Area of Pain) (Bilateral) (R>L) 02/26/2018  . Chronic hip pain (Fourth Area of Pain) (Bilateral) (R>L) 02/26/2018  . Neurogenic pain 02/26/2018  . Pharmacologic therapy 02/26/2018  . Problems influencing health status 02/26/2018  . Long term prescription benzodiazepine use 02/26/2018  . Long term prescription opiate use 02/26/2018  . Acute systolic heart failure ([place])   . Non-ST elevation (NSTEMI) myocardial infarction ([place])   . History of uterine cancer 12/08/2017  . Generalized anxiety disorder 12/08/2017  . GERD (gastroesophageal reflux disease) 12/08/2017  . Substernal chest pain 12/08/2017  . Elevated brain natriuretic peptide (BNP) level 12/08/2017  . Elevated troponin 12/08/2017  . Embedded tick of upper back excluding scapular region 12/08/2017  . Chronic sacroiliac joint pain 06/21/2017  . Disorder of skeletal system 06/21/2017  . Other long term (current) drug therapy 06/21/2017  . Other specified health status 06/21/2017  . Chronic chest wall  pain (Primary Area of Pain) 06/21/2017  . Chronic post-thoracotomy pain 03/03/2017  . 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 Adenocarcinoma of right lung, stage 1 ([place]) 07/28/2016  . S/P lobectomy of lung 05/09/2016  . DVT (deep venous thrombosis) ([place]) 04/26/2016  . Heart murmur   . Acute exacerbation of chronic obstructive pulmonary  disease (COPD) ([place]) 08/29/2015  . Essential hypertension 08/29/2015  . Hypercholesterolemia 08/29/2015  . CKD (chronic kidney disease), stage III ([place]) 08/29/2015  . COPD (chronic obstructive pulmonary disease) ([place]) 04/27/2015  . Acute on chronic respiratory failure with hypoxia ([place]) 04/27/2015  . Infected sebaceous cyst 08/16/2011    Past Surgical History:  Procedure Laterality Date  . ANKLE SURGERY Right    horse accident  . APPENDECTOMY    . BACK SURGERY    . CHOLECYSTECTOMY    . EYE SURGERY     lense implant  . FRACTURE SURGERY    . HIP SURGERY  01/2010   Left Hip   . INNER EAR SURGERY Bilateral 1970   related to severe ear infections  . KNEE SURGERY     Right knee  . LEFT HEART CATH AND CORONARY ANGIOGRAPHY N/A 12/11/2017   Procedure: LEFT HEART CATH AND CORONARY ANGIOGRAPHY;  Surgeon: Jettie Booze, MD;  Location: [facility];  Service: Cardiovascular;  Laterality: N/A;  . LOBECTOMY Right 05/09/2016   Procedure: RIGHT UPPER LOBECTOMY;  Surgeon: [name], MD;  Location: [facility];  Service: Thoracic;  Laterality: Right;  . MASS EXCISION  08/25/2011   Procedure: EXCISION MASS;  Surgeon: Harl Bowie, MD;  Location: [facility];  Service: General;  Laterality: N/A;  excision left neck mass  . TOTAL ABDOMINAL HYSTERECTOMY    . TOTAL KNEE ARTHROPLASTY Right 05/27/2019   Procedure: TOTAL KNEE ARTHROPLASTY;  Surgeon: Vickey Huger, MD;  Location: [facility];  Service: Orthopedics;  Laterality: Right;  75 mins needed for length of case  . VIDEO ASSISTED THORACOSCOPY (VATS)/WEDGE RESECTION Right 05/09/2016   Procedure: VIDEO ASSISTED THORACOSCOPY (VATS)/WEDGE RESECTION;  Surgeon: [name], MD;  Location: [facility];  Service: Thoracic;  Laterality: Right;     OB History   No obstetric history on file.     Family History  Problem Relation Age of Onset  . Heart disease Mother   . Heart disease Father   . Cancer Sister        #1,  breast  . Heart disease Brother        had CABG    Social History   Tobacco Use  . Smoking status: Former Smoker    Packs/day: 2.00    Years: 51.00    Pack years: 102.00    Types: Cigarettes    Quit date: 05/04/2012    Years since quitting: 7.3  . Smokeless tobacco: Never Used  Substance Use Topics  . Alcohol use: No    Alcohol/week: 0.0 standard drinks  . Drug use: No    Home Medications Prior to Admission medications   Medication Sig Start Date End Date Taking? Authorizing Provider  acetaminophen (TYLENOL) 500 MG tablet Take 2 tablets (1,000 mg total) by mouth every 6 (six) hours. 05/28/19   Donia Ast, PA  albuterol (PROVENTIL HFA;VENTOLIN HFA) 108 (90 BASE) MCG/ACT inhaler Inhale 2 puffs into the lungs every 6 (six) hours as needed. Patient taking differently: Inhale 2 puffs into the lungs every 4 (four) hours as needed for wheezing or shortness of breath.  04/28/15  Hosie Poisson, MD  albuterol (PROVENTIL) (2.5 MG/3ML) 0.083% nebulizer solution Take 3 mLs (2.5 mg total) by nebulization every 2 (two) hours as needed for wheezing or shortness of breath. 04/28/15   Hosie Poisson, MD  aluminum-magnesium hydroxide-simethicone (MAALOX) 277-824-23 MG/5ML SUSP Take 30 mLs by mouth as needed (indigestion).    [provider]  amLODipine (NORVASC) 5 MG tablet Take 5 mg by mouth daily.    [provider]  aspirin EC 325 MG EC tablet Take 1 tablet (325 mg total) by mouth 2 (two) times daily. 05/28/19   Donia Ast, PA  atorvastatin (LIPITOR) 20 MG tablet Take 20 mg by mouth daily.     [provider]  diazepam (VALIUM) 5 MG tablet Take 0.5 tablets (2.5 mg total) by mouth every 8 (eight) hours as needed for anxiety (for nerves). Must last 30 days. Filled 11-28-17 06/05/19   Donia Ast, PA  Fluticasone-Salmeterol (ADVAIR DISKUS) 250-50 MCG/DOSE AEPB Inhale 1 puff into the lungs every 12 (twelve) hours. 08/30/15   Debbe Odea, MD  gabapentin  (NEURONTIN) 400 MG capsule Take 1 capsule (400 mg total) by mouth 4 (four) times daily. 05/24/18 05/21/19  Vevelyn Francois, NP  ketoconazole (NIZORAL) 2 % cream Apply 1 application topically daily. 05/14/19   [provider]  lisinopril (PRINIVIL,ZESTRIL) 10 MG tablet Take 1 tablet (10 mg total) by mouth every morning. 12/12/17   Domenic Polite, MD  metoCLOPramide (REGLAN) 5 MG tablet Take 5 mg by mouth 2 (two) times daily.     [provider]  metoprolol tartrate (LOPRESSOR) 25 MG tablet Take 0.5 tablets (12.5 mg total) by mouth 2 (two) times daily. 12/12/17   Domenic Polite, MD  Multiple Vitamins-Minerals (WOMENS 50+ ADVANCED PO) Take 1 tablet by mouth daily.    [provider]  omega-3 acid ethyl esters (LOVAZA) 1 g capsule Take by mouth 2 (two) times daily.    [provider]  ondansetron (ZOFRAN ODT) 4 MG disintegrating tablet 4mg  ODT q4 hours prn nausea/vomit 07/09/19   Deno Etienne, DO  pantoprazole (PROTONIX) 40 MG tablet Take 1 tablet (40 mg total) by mouth daily at 6 (six) AM. 04/28/15   Hosie Poisson, MD  raloxifene (EVISTA) 60 MG tablet Take 60 mg by mouth daily.    [provider]  sertraline (ZOLOFT) 100 MG tablet Take 100 mg by mouth daily.    [provider]  tiotropium (SPIRIVA) 18 MCG inhalation capsule Place 18 mcg into inhaler and inhale daily.      [provider]  tiZANidine (ZANAFLEX) 2 MG tablet Take 1 tablet (2 mg total) by mouth every 6 (six) hours as needed. 05/28/19   Donia Ast, PA  traMADol (ULTRAM) 50 MG tablet Take 1 tablet (50 mg total) by mouth 3 (three) times daily as needed. 06/05/19   Donia Ast, PA  TRELEGY ELLIPTA 100-62.5-25 MCG/INH AEPB Inhale 1 puff into the lungs daily. 05/14/19   [provider]    Allergies    Boniva [ibandronate sodium], Lyrica [pregabalin], and Levofloxacin  Review of Systems   Review of Systems  All other systems reviewed and are  negative.   Physical Exam Updated Vital Signs BP 102/65   Pulse (!) 48   Resp 15   Wt 63.5 kg   SpO2 100%   BMI 25.61 kg/m   Physical Exam Vitals and nursing note reviewed.  Constitutional:      Appearance: Normal appearance.  HENT:  Head: Normocephalic and atraumatic.     Right Ear: External ear normal.     Left Ear: External ear normal.     Nose: Nose normal.     Mouth/Throat:     Mouth: Mucous membranes are moist.     Pharynx: Oropharynx is clear.  Eyes:     Extraocular Movements: Extraocular movements intact.     Pupils: Pupils are equal, round, and reactive to light.  Cardiovascular:     Rate and Rhythm: Normal rate and regular rhythm.     Pulses: Normal pulses.     Heart sounds: Normal heart sounds.  Pulmonary:     Effort: Pulmonary effort is normal.     Breath sounds: Normal breath sounds.  Abdominal:     General: Abdomen is flat.     Palpations: Abdomen is soft.  Musculoskeletal:        General: Normal range of motion.     Cervical back: Normal range of motion.  Skin:    General: Skin is warm.     Capillary Refill: Capillary refill takes less than 2 seconds.  Neurological:     General: No focal deficit present.     Mental Status: She is alert and oriented to person, place, and time.     Cranial Nerves: No cranial nerve deficit.     Sensory: No sensory deficit.     Motor: No weakness.     Coordination: Coordination normal.     Deep Tendon Reflexes: Reflexes normal.  Psychiatric:        Mood and Affect: Mood normal.     ED Results / Procedures / Treatments   Labs (all labs ordered are listed, but only abnormal results are displayed) Labs Reviewed  CBC - Abnormal; Notable for the following components:      Result Value   RBC 3.61 (*)    Hemoglobin 10.2 (*)    HCT 33.6 (*)    RDW 15.6 (*)    All other components within normal limits  COMPREHENSIVE METABOLIC PANEL - Abnormal; Notable for the following components:   Glucose, Bld 101 (*)     Creatinine, Ser 1.28 (*)    Calcium 8.4 (*)    Albumin 3.1 (*)    GFR calc non Af Amer 41 (*)    GFR calc Af Amer 47 (*)    All other components within normal limits  ETHANOL  PROTIME-INR  APTT  DIFFERENTIAL  RAPID URINE DRUG SCREEN, HOSP PERFORMED  URINALYSIS, ROUTINE W REFLEX MICROSCOPIC  OCCULT BLOOD X 1 CARD TO LAB, STOOL  CBG MONITORING, ED  I-STAT CHEM 8, ED    EKG EKG Interpretation  Date/Time:  Saturday September 14 2019 10:31:29 EST Ventricular Rate:  49 PR Interval:    QRS Duration: 96 QT Interval:  526 QTC Calculation: 475 R Axis:   40 Text Interpretation: Sinus bradycardia Confirmed by Pattricia Boss [phone]) on 09/14/2019 11:04:57 AM   Radiology CT HEAD WO CONTRAST  Result Date: 09/14/2019 CLINICAL DATA:  Focal neurologic deficit-weakness and lightheadedness this morning. EXAM: CT HEAD WITHOUT CONTRAST TECHNIQUE: Contiguous axial images were obtained from the base of the skull through the vertex without intravenous contrast. COMPARISON:  CT head dated 06/02/2019 FINDINGS: Brain: No evidence of acute infarction, hemorrhage, hydrocephalus, extra-axial collection or mass lesion/mass effect. There is mild cerebral volume loss with associated ex vacuo dilatation. Periventricular white matter hypoattenuation likely represents chronic small vessel ischemic disease. Vascular: There are vascular calcifications in the carotid siphons. Skull: Normal. Negative  for fracture. Bilateral mastoidectomies are noted. Sinuses/Orbits: No acute finding. Other: None. IMPRESSION: No acute intracranial process. Electronically Signed   By: Zerita Boers M.D.   On: 09/14/2019 11:49    Procedures Procedures (including critical care time)  Medications Ordered in ED Medications  sodium chloride 0.9 % bolus 1,000 mL (1,000 mLs Intravenous New Bag/Given 09/14/19 1125)    ED Course  I have reviewed the triage vital signs and the nursing notes.  Pertinent labs & imaging results that were  available during my care of the patient were reviewed by me and considered in my medical decision making (see chart for details).    MDM Rules/Calculators/A&P                     Patient with some generalized weakness.  No focal deficits.  Mild bradycardia at 50 but no hypotension.  Patient ambulated in ED and feels improved after iv fluids Plan hold lopressor and recheck on Monday Discussed return precautions, need for follow up, and patient voices understanding.   Final Clinical Impression(s) / ED Diagnoses Final diagnoses:  Weakness  Bradycardia    Rx / DC Orders ED Discharge Orders    None       Pattricia Boss, MD 09/15/19 7847    Pattricia Boss, MD 09/15/19 [phone]

## 2019-09-23 DIAGNOSIS — Z743 Need for continuous supervision: Secondary | ICD-10-CM | POA: Diagnosis not present

## 2019-09-23 DIAGNOSIS — R42 Dizziness and giddiness: Secondary | ICD-10-CM | POA: Diagnosis not present

## 2019-09-23 DIAGNOSIS — I959 Hypotension, unspecified: Secondary | ICD-10-CM | POA: Diagnosis not present

## 2019-10-01 DIAGNOSIS — M179 Osteoarthritis of knee, unspecified: Secondary | ICD-10-CM | POA: Diagnosis not present

## 2019-10-01 DIAGNOSIS — F322 Major depressive disorder, single episode, severe without psychotic features: Secondary | ICD-10-CM | POA: Diagnosis not present

## 2019-10-01 DIAGNOSIS — M810 Age-related osteoporosis without current pathological fracture: Secondary | ICD-10-CM | POA: Diagnosis not present

## 2019-10-01 DIAGNOSIS — J441 Chronic obstructive pulmonary disease with (acute) exacerbation: Secondary | ICD-10-CM | POA: Diagnosis not present

## 2019-10-01 DIAGNOSIS — N183 Chronic kidney disease, stage 3 unspecified: Secondary | ICD-10-CM | POA: Diagnosis not present

## 2019-10-01 DIAGNOSIS — J449 Chronic obstructive pulmonary disease, unspecified: Secondary | ICD-10-CM | POA: Diagnosis not present

## 2019-10-01 DIAGNOSIS — M1990 Unspecified osteoarthritis, unspecified site: Secondary | ICD-10-CM | POA: Diagnosis not present

## 2019-10-01 DIAGNOSIS — I10 Essential (primary) hypertension: Secondary | ICD-10-CM | POA: Diagnosis not present

## 2019-10-01 DIAGNOSIS — E782 Mixed hyperlipidemia: Secondary | ICD-10-CM | POA: Diagnosis not present

## 2019-10-01 DIAGNOSIS — M1712 Unilateral primary osteoarthritis, left knee: Secondary | ICD-10-CM | POA: Diagnosis not present

## 2019-10-29 ENCOUNTER — Other Ambulatory Visit: Payer: Self-pay | Admitting: Internal Medicine

## 2019-10-29 DIAGNOSIS — M7989 Other specified soft tissue disorders: Secondary | ICD-10-CM

## 2019-10-30 ENCOUNTER — Ambulatory Visit
Admission: RE | Admit: 2019-10-30 | Discharge: 2019-10-30 | Disposition: A | Discharge status: Home / Self Care | Payer: Medicare Other | Source: Ambulatory Visit | Attending: Internal Medicine | Admitting: Internal Medicine

## 2019-10-30 DIAGNOSIS — M7989 Other specified soft tissue disorders: Secondary | ICD-10-CM

## 2019-10-30 IMAGING — US US EXTREM LOW VENOUS*L*
1 series · 13 of 24 positions shown · non-contrast
Comparison: None.

CLINICAL DATA: Left leg swelling



[Series 1: us extrem low venous*left* · 0.08mm/px · 13 of 34 slices shown]
[im 1/34]
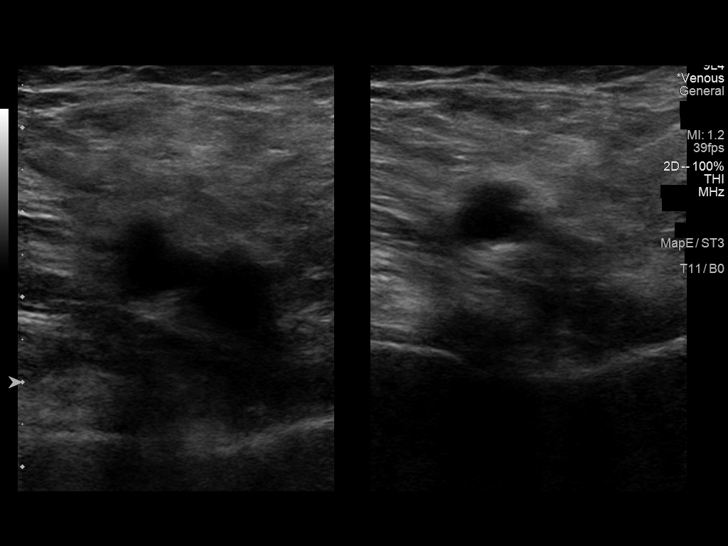
[im 3/34]
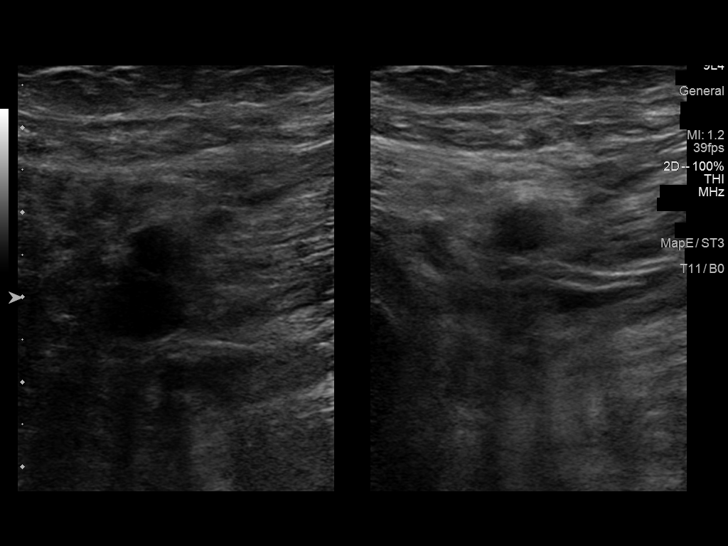
[im 6/34]
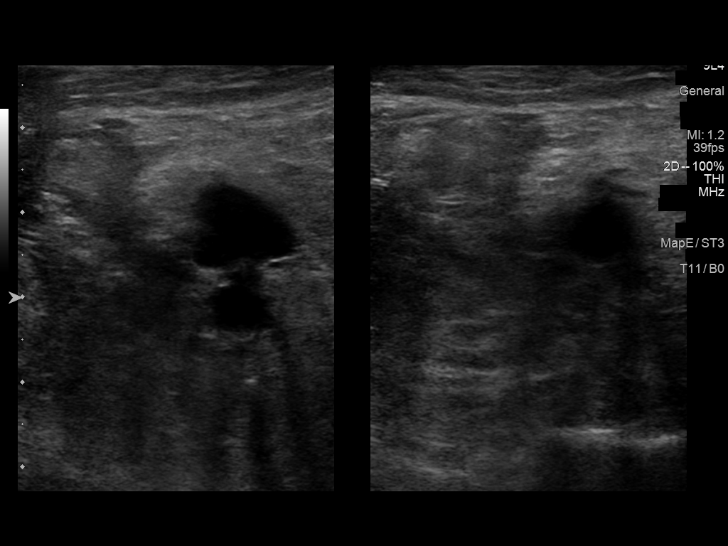
[im 9/34]
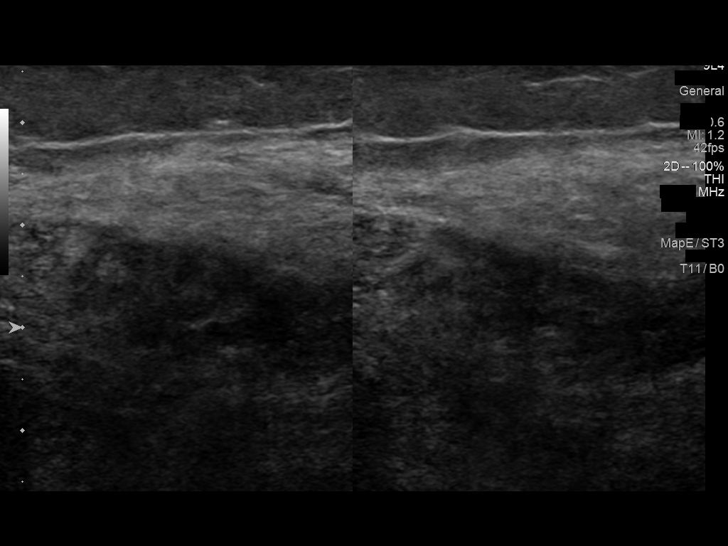
[im 12/34]
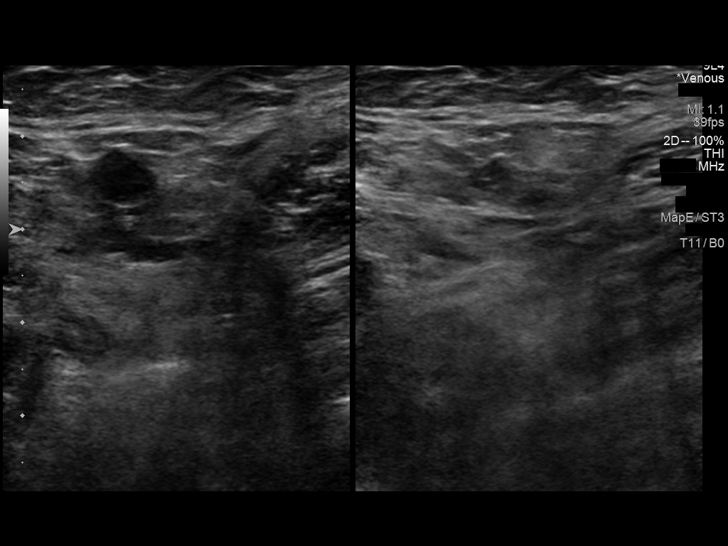
[im 15/34]
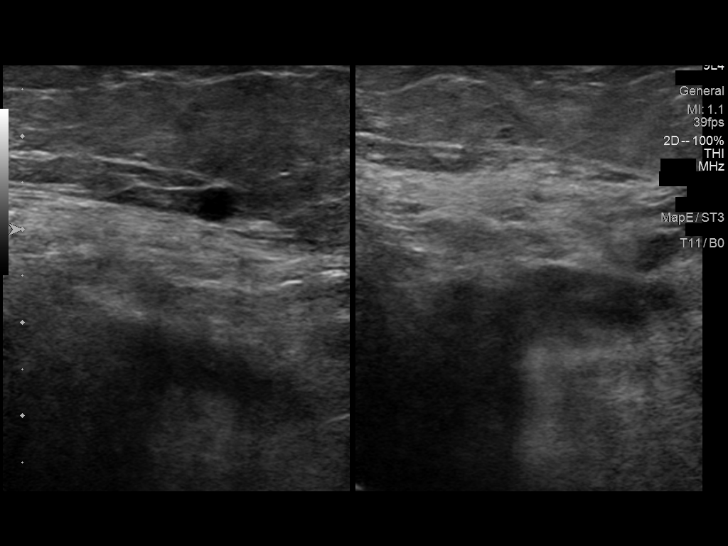
[im 18/34]
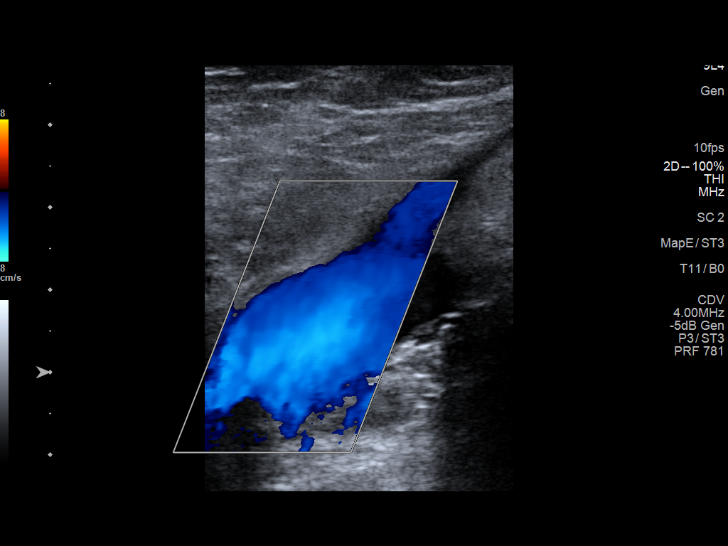
[im 19/34]
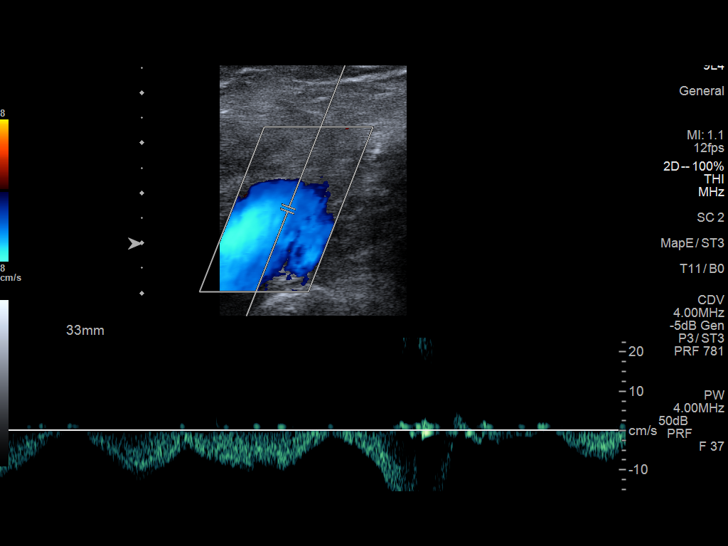
[im 22/34]
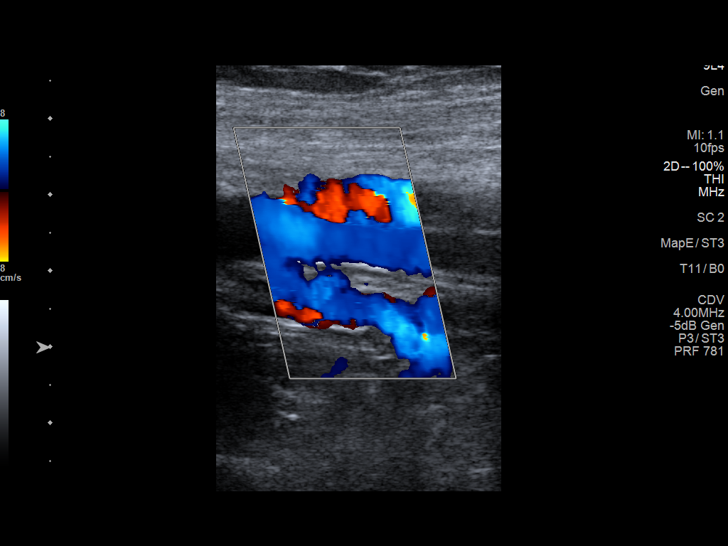
[im 25/34]
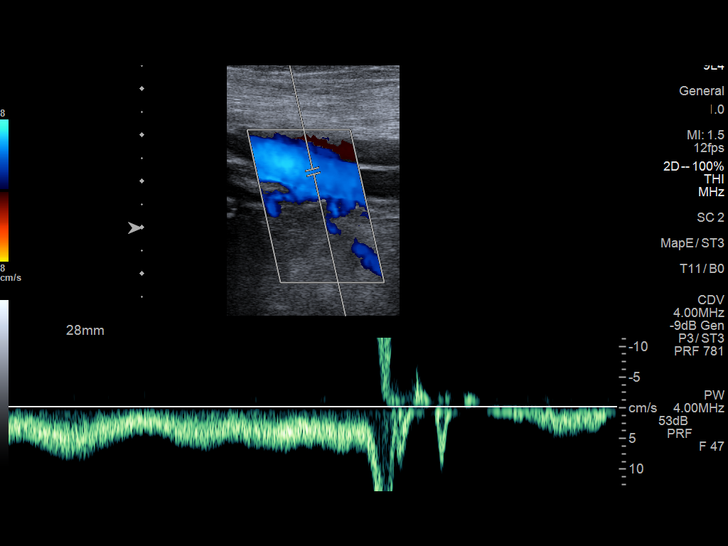
[im 28/34]
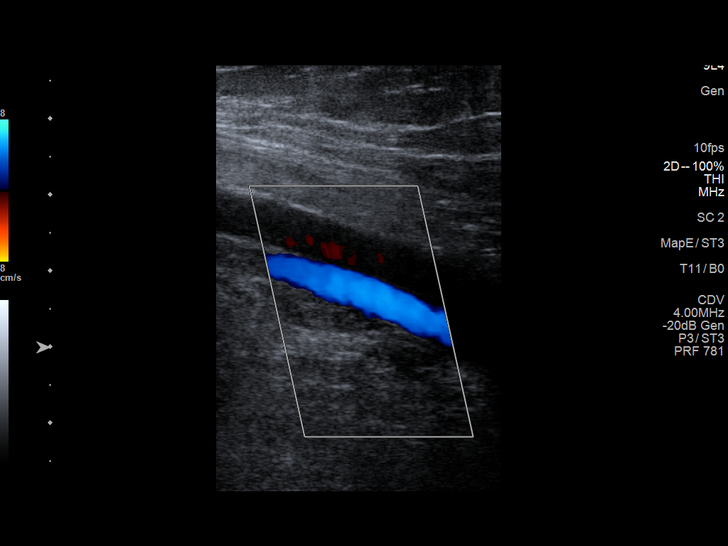
[im 31/34]
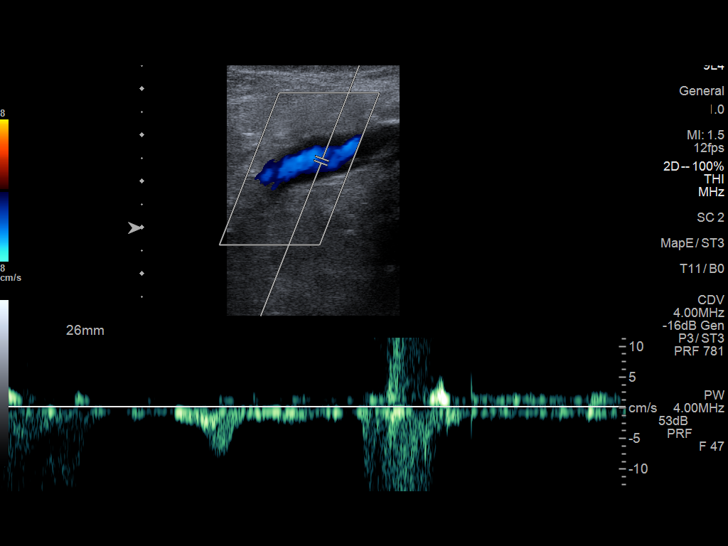
[im 34/34]
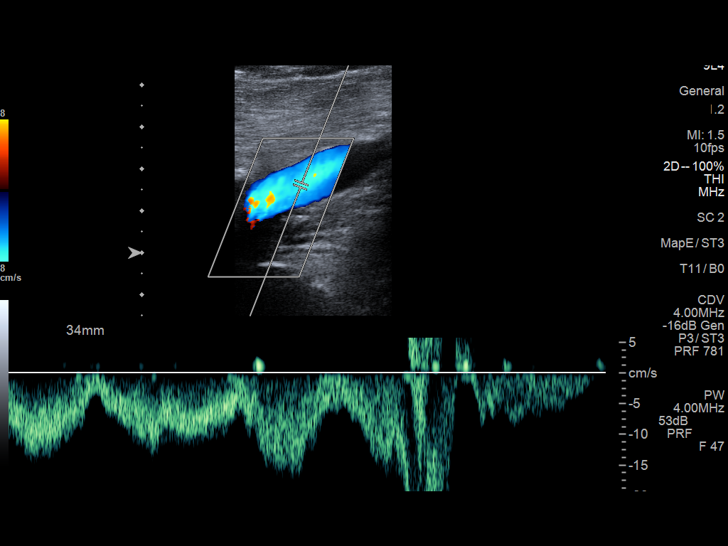

[13 of 24 positions shown; findings below may reference images not displayed]

FINDINGS: Contralateral Common Femoral Vein: Respiratory phasicity is normal
and symmetric with the symptomatic side. No evidence of thrombus.
Normal compressibility.

Common Femoral Vein: No evidence of thrombus. Normal
compressibility, respiratory phasicity and response to augmentation.

Saphenofemoral Junction: No evidence of thrombus. Normal
compressibility and flow on color Doppler imaging.

Profunda Femoral Vein: No evidence of thrombus. Normal
compressibility and flow on color Doppler imaging.

Femoral Vein: No evidence of thrombus. Normal compressibility,
respiratory phasicity and response to augmentation.

Popliteal Vein: No evidence of thrombus. Normal compressibility,
respiratory phasicity and response to augmentation.

Calf Veins: No evidence of thrombus. Normal compressibility and flow
on color Doppler imaging.

Superficial Great Saphenous Vein: No evidence of thrombus. Normal
compressibility.

Venous Reflux:  None.

Other Findings:  None.
IMPRESSION: No evidence of left lower extremity deep venous thrombosis.

## 2019-11-21 ENCOUNTER — Telehealth: Payer: Self-pay | Admitting: General Practice

## 2019-11-21 NOTE — Telephone Encounter (Signed)
   [facility] Pre-operative Risk Assessment    Request for surgical clearance:  1. What type of surgery is being performed? Total Knee Replacement, LEFT   2. When is this surgery scheduled? TBD   3. What type of clearance is required (medical clearance vs. Pharmacy clearance to hold med vs. Both)?  Medical Clearance  4. Are there any medications that need to be held prior to surgery and how long? Not specified   5. Practice name and name of physician performing surgery?  [facility]/ Dr. [name]   6. What is your office phone number [phone]    7.   What is your office fax number [phone]  8.   Anesthesia type (None, local, MAC, general) ? Not specified   Therisa Doyne 11/21/2019, 2:25 PM  _________________________________________________________________   (provider comments below)

## 2019-11-22 NOTE — Telephone Encounter (Signed)
   Primary Cardiologist: Kirk Ruths, MD  Chart reviewed as part of pre-operative protocol coverage.   She has an appointment to see Coletta Memos, NP 11/25/19 for preoperative assessment.   I will route this to [facility] as an FYI and remove from pre-op pool.   Abigail Butts, PA-C 11/22/2019, 8:22 AM

## 2019-11-24 NOTE — Progress Notes (Signed)
Cardiology Clinic Note   Patient Name: [name] Date of Encounter: 11/25/2019  Primary Care Provider:  Wenda Low, MD Primary Cardiologist:  Kirk Ruths, MD  Patient Profile    [name] 79 year old female presents today for follow-up evaluation of her essential hypertension, NSTEMI, acute systolic heart failure, and preoperative evaluation.  Past Medical History    Past Medical History:  Diagnosis Date  . Adenocarcinoma of right lung, stage 1 ([place]) 07/28/2016  . Anxiety   . Asthma   . Cancer ([place])    uterine  . COPD (chronic obstructive pulmonary disease) ([place])   . Coronary artery disease    mild nonobstructive by 2019 cath  . Cyst    left side of neck  . Depression   . GERD (gastroesophageal reflux disease)   . Hyperlipidemia   . Hypertension   . Low back pain   . Osteoarthritis   . Osteoporosis   . Takotsubo cardiomyopathy    EF recovered to 50-55% by 01/23/18 echo   Past Surgical History:  Procedure Laterality Date  . ANKLE SURGERY Right    horse accident  . APPENDECTOMY    . BACK SURGERY    . CHOLECYSTECTOMY    . EYE SURGERY     lense implant  . FRACTURE SURGERY    . HIP SURGERY  01/2010   Left Hip   . INNER EAR SURGERY Bilateral 1970   related to severe ear infections  . KNEE SURGERY     Right knee  . LEFT HEART CATH AND CORONARY ANGIOGRAPHY N/A 12/11/2017   Procedure: LEFT HEART CATH AND CORONARY ANGIOGRAPHY;  Surgeon: Jettie Booze, MD;  Location: [facility];  Service: Cardiovascular;  Laterality: N/A;  . LOBECTOMY Right 05/09/2016   Procedure: RIGHT UPPER LOBECTOMY;  Surgeon: [name], MD;  Location: [facility];  Service: Thoracic;  Laterality: Right;  . MASS EXCISION  08/25/2011   Procedure: EXCISION MASS;  Surgeon: Harl Bowie, MD;  Location: [facility];  Service: General;  Laterality: N/A;  excision left neck mass  . TOTAL ABDOMINAL HYSTERECTOMY    . TOTAL KNEE ARTHROPLASTY Right  05/27/2019   Procedure: TOTAL KNEE ARTHROPLASTY;  Surgeon: Vickey Huger, MD;  Location: [facility];  Service: Orthopedics;  Laterality: Right;  75 mins needed for length of case  . VIDEO ASSISTED THORACOSCOPY (VATS)/WEDGE RESECTION Right 05/09/2016   Procedure: VIDEO ASSISTED THORACOSCOPY (VATS)/WEDGE RESECTION;  Surgeon: [name], MD;  Location: [facility];  Service: Thoracic;  Laterality: Right;    Allergies  Allergies  Allergen Reactions  . Boniva [Ibandronate Sodium] Other (See Comments)    UNSPECIFIED REACTION   . Lyrica [Pregabalin] Nausea And Vomiting    Dizziness and blurred vision as well  . Levofloxacin     History of Present Illness    She has a PMH of essential hypertension, DVT, NSTEMI, acute systolic heart failure, COPD, GERD, DDD, CKD stage III, neuropathic pain syndrome, chest wall pain, heart murmur, and hiatal hernia.  She was seen by Jory Sims, DNP on 01/17/2018.  During that time she was noticing generalized fatigue sharp pain under her right breast and back from her prior thoracotomy.  She denied dizziness, palpitations, and dyspnea and was compliant with her medication.  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  Her echocardiogram on 01/23/2018 showed an LVEF of 50 to 55% left ventricle moderate hypokinesis mid anterior septal myocardium and grade 1 diastolic dysfunction.  She presented to the clinic 04/24/2019 and stated she had been doing well.  She stated that she would like clearance for her knee surgery.  She felt she was unable to walk any distance due to knee pain and stated that she could no longer walk out to her mailbox due to discomfort in her knees.  She presented to the emergency department on 09/14/2019 with generalized weakness and dizziness.  She had reported decreased appetite over the last few days but had no other complaints.  She was taking her medications as prescribed.  She denied chest  pain, nausea, vomiting, or diarrhea.  Her EKG showed sinus bradycardia 49 bpm.  She had no hypotension.  She was given IV fluids ambulated in the ED and felt better.  She was instructed to hold her Lopressor and recheck her blood pressure on the following Monday.   She again presents to the cardiology office for follow-up and clearance for her left total knee replacement.  She states she did well after her right knee replacement and is much more active now.  She continues to follow a low-sodium diet.  She was recently in the emergency department due to weakness but recovered quickly after receiving IV fluids.  She has no cardiac complaints at this time and states she overall feels well.  I will clear her for her upcoming left knee replacement and have her follow-up with Dr. Stanford Breed in 6 months.  Today she denies chest pain, shortness of breath, lower extremity edema, fatigue, palpitations, melena, hematuria, hemoptysis, diaphoresis, weakness, presyncope, syncope, orthopnea, and PND.  Home Medications    Prior to Admission medications   Medication Sig Start Date End Date Taking? Authorizing Provider  acetaminophen (TYLENOL) 500 MG tablet Take 2 tablets (1,000 mg total) by mouth every 6 (six) hours. 05/28/19   Donia Ast, PA  albuterol (PROVENTIL HFA;VENTOLIN HFA) 108 (90 BASE) MCG/ACT inhaler Inhale 2 puffs into the lungs every 6 (six) hours as needed. Patient taking differently: Inhale 2 puffs into the lungs every 4 (four) hours as needed for wheezing or shortness of breath.  04/28/15   Hosie Poisson, MD  albuterol (PROVENTIL) (2.5 MG/3ML) 0.083% nebulizer solution Take 3 mLs (2.5 mg total) by nebulization every 2 (two) hours as needed for wheezing or shortness of breath. 04/28/15   Hosie Poisson, MD  aluminum-magnesium hydroxide-simethicone (MAALOX) 366-440-34 MG/5ML SUSP Take 30 mLs by mouth as needed (indigestion).    [provider]  amLODipine (NORVASC) 5 MG tablet Take 5 mg by  mouth daily.    [provider]  aspirin EC 325 MG EC tablet Take 1 tablet (325 mg total) by mouth 2 (two) times daily. Patient taking differently: Take 325 mg by mouth daily.  05/28/19   Donia Ast, PA  atorvastatin (LIPITOR) 20 MG tablet Take 20 mg by mouth every evening.     [provider]  diazepam (VALIUM) 5 MG tablet Take 0.5 tablets (2.5 mg total) by mouth every 8 (eight) hours as needed for anxiety (for nerves). Must last 30 days. Filled 11-28-17 Patient taking differently: Take 5 mg by mouth every 8 (eight) hours as needed for anxiety (for nerves). Must last 30 days. Filled 11-28-17 06/05/19   Donia Ast, PA  Fluticasone-Salmeterol (ADVAIR DISKUS) 250-50 MCG/DOSE AEPB Inhale 1 puff into the lungs every 12 (twelve) hours. Patient not taking: Reported on 09/14/2019 08/30/15   Debbe Odea, MD  gabapentin (NEURONTIN) 400 MG capsule Take 1 capsule (400 mg total) by mouth  4 (four) times daily. 05/24/18 09/14/19  Vevelyn Francois, NP  lisinopril (PRINIVIL,ZESTRIL) 10 MG tablet Take 1 tablet (10 mg total) by mouth every morning. 12/12/17   Domenic Polite, MD  meloxicam (MOBIC) 7.5 MG tablet Take 7.5-15 mg by mouth daily as needed for pain. 09/06/19   [provider]  metoCLOPramide (REGLAN) 5 MG tablet Take 5 mg by mouth 2 (two) times daily.     [provider]  metoprolol tartrate (LOPRESSOR) 25 MG tablet Take 0.5 tablets (12.5 mg total) by mouth 2 (two) times daily. Patient taking differently: Take 12.5 mg by mouth every morning.  12/12/17   Domenic Polite, MD  omega-3 acid ethyl esters (LOVAZA) 1 g capsule Take 1 g by mouth at bedtime.     [provider]  ondansetron (ZOFRAN ODT) 4 MG disintegrating tablet 4mg  ODT q4 hours prn nausea/vomit Patient taking differently: Take 4 mg by mouth every 8 (eight) hours as needed for nausea or vomiting.  07/09/19   Deno Etienne, DO  oxybutynin (DITROPAN-XL) 5 MG 24 hr tablet Take 5 mg by mouth at  bedtime. 09/06/19   [provider]  pantoprazole (PROTONIX) 40 MG tablet Take 1 tablet (40 mg total) by mouth daily at 6 (six) AM. 04/28/15   Hosie Poisson, MD  raloxifene (EVISTA) 60 MG tablet Take 60 mg by mouth daily.    [provider]  sertraline (ZOLOFT) 100 MG tablet Take 100 mg by mouth daily.    [provider]  tiZANidine (ZANAFLEX) 2 MG tablet Take 1 tablet (2 mg total) by mouth every 6 (six) hours as needed. Patient taking differently: Take 2 mg by mouth every 6 (six) hours as needed for muscle spasms.  05/28/19   Donia Ast, PA  traMADol (ULTRAM) 50 MG tablet Take 1 tablet (50 mg total) by mouth 3 (three) times daily as needed. 06/05/19   Donia Ast, PA  TRELEGY ELLIPTA 100-62.5-25 MCG/INH AEPB Inhale 1 puff into the lungs daily. 05/14/19   [provider]    Family History    Family History  Problem Relation Age of Onset  . Heart disease Mother   . Heart disease Father   . Cancer Sister        #1, breast  . Heart disease Brother        had CABG   She indicated that her mother is deceased. She indicated that her father is deceased. She indicated that the status of her sister is unknown. She indicated that the status of her brother is unknown.  Social History    Social History   Socioeconomic History  . Marital status: Widowed    Spouse name: Not on file  . Number of children: Not on file  . Years of education: Not on file  . Highest education level: Not on file  Occupational History  . Not on file  Tobacco Use  . Smoking status: Former Smoker    Packs/day: 2.00    Years: 51.00    Pack years: 102.00    Types: Cigarettes    Quit date: 05/04/2012    Years since quitting: 7.5  . Smokeless tobacco: Never Used  Substance and Sexual Activity  . Alcohol use: No    Alcohol/week: 0.0 standard drinks  . Drug use: No  . Sexual activity: Yes    Birth control/protection: Other-see comments  Other Topics Concern  .  Not on file  Social History Narrative   Single, lives alone with  2 dogs   3 children   Retired: several careers Lawyer, diners, truck stops   Works now on the weekends at [employer]:   . Difficulty of Paying Living Expenses:   Food Insecurity:   . Worried About Charity fundraiser in the Last Year:   . Arboriculturist in the Last Year:   Transportation Needs:   . Film/video editor (Medical):   Marland Kitchen Lack of Transportation (Non-Medical):   Physical Activity:   . Days of Exercise per Week:   . Minutes of Exercise per Session:   Stress:   . Feeling of Stress :   Social Connections:   . Frequency of Communication with Friends and Family:   . Frequency of Social Gatherings with Friends and Family:   . Attends Religious Services:   . Active Member of Clubs or Organizations:   . Attends Archivist Meetings:   Marland Kitchen Marital Status:   Intimate Partner Violence:   . Fear of Current or Ex-Partner:   . Emotionally Abused:   Marland Kitchen Physically Abused:   . Sexually Abused:      Review of Systems    General:  No chills, fever, night sweats or weight changes.  Cardiovascular:  No chest pain, dyspnea on exertion, edema, orthopnea, palpitations, paroxysmal nocturnal dyspnea. Dermatological: No rash, lesions/masses Respiratory: No cough, dyspnea Urologic: No hematuria, dysuria Abdominal:   No nausea, vomiting, diarrhea, bright red blood per rectum, melena, or hematemesis Neurologic:  No visual changes, wkns, changes in mental status. All other systems reviewed and are otherwise negative except as noted above.  Physical Exam    VS:  BP 140/82   Pulse 64   Temp (!) 97 F (36.1 C)   Ht 5\' 3"  (1.6 m)   Wt 153 lb (69.4 kg)   SpO2 96%   BMI 27.10 kg/m  , BMI Body mass index is 27.1 kg/m. GEN: Well nourished, well developed, in no acute distress. HEENT: normal. Neck: Supple, no JVD, carotid bruits, or  masses. Cardiac: RRR, no murmurs, rubs, or gallops. No clubbing, cyanosis, edema.  Radials/DP/PT 2+ and equal bilaterally.  Respiratory:  Respirations regular and unlabored, clear to auscultation bilaterally. GI: Soft, nontender, nondistended, BS + x 4. MS: no deformity or atrophy. Skin: warm and dry, no rash. Neuro:  Strength and sensation are intact. Psych: Normal affect.  Accessory Clinical Findings    ECG personally reviewed by me today-normal sinus rhythm 65 bpm- No acute changes  EKG 09/16/2019 Sinus bradycardia 49 bpm  EKG 04/24/2019 sinus bradycardia 58 bpm- No acute changes  EKG 03/31/2018 Sinus rhythm 98 bpm  Echocardiogram 01/23/2018 Study Conclusions  - Left ventricle: The cavity size was normal. Wall thickness was normal. Systolic function was normal. The estimated ejection fraction was in the range of 50% to 55%. Moderate hypokinesis of the midanteroseptal myocardium. Doppler parameters are consistent with abnormal left ventricular relaxation (grade 1 diastolic dysfunction).  Cardiac catheterization 12/11/2017  Prox RCA lesion is 25% stenosed.  Mid LAD lesion is 25% stenosed.  There is moderate left ventricular systolic dysfunction.  LV end diastolic pressure is moderately elevated.  The left ventricular ejection fraction is 25-35% by visual estimate, with apical hypokinesis.  There is no aortic valve stenosis.  Nonobstructive CAD. Takotsubo cardiomyopathy pattern. Continue medical therapy.   Assessment & Plan   1.  Essential hypertension-BP today 140/82.  Well-controlled at home 130s over 80s. Continue  amlodipine 5 mg tablet daily Continue lisinopril 10 mg tablet daily Continue metoprolol tartrate 12.5 mg tablet twice daily Increase physical activity as tolerated Heart healthy low-sodium high-fiber diet  Hyperlipidemia- LDL 52 on 03/15/2019. nonobstructive coronary disease on 12/11/2017 cardiac catheterization. Continue atorvastatin 20  mg tablet daily Increase physical activity as tolerated Heart healthy low-sodium high-fiber diet  Preop cardiac evaluation for- left total knee artroplasty. Patient statesshe is not able to walk up 2 flights of stairs or walk 4 city blocks to complete 4 METS of physical activity, due to her knee pain.  She underwent cardiac ischemic evaluation on 12/11/2017 which showed nonobstructive coronary disease.  Her echocardiogram on 01/23/2018 showed an ejection fraction of 50 to 55% and grade 1 diastolic dysfunction.  Therefore,no further cardiac evaluation is recommended at this time.HisRCRI riskis classI,with a0.4% risk of a major cardiac event.       Primary Cardiologist: Kirk Ruths, MD  Chart reviewed as part of pre-operative protocol coverage. Given past medical history and time since last visit, based on ACC/AHA guidelines, DORLIS JUDICE would be at acceptable risk for the planned procedure without further cardiovascular testing.   I will route this recommendation to the requesting party via Epic fax function and remove from pre-op pool.  Please call with questions.   Disposition follow-up with Dr. Stanford Breed in 6 months.   Jossie Ng. Ayden Apodaca NP-C    11/25/2019, 2:59 PM [address] Office [phone] Fax [phone]

## 2019-11-25 ENCOUNTER — Encounter: Payer: Self-pay | Admitting: General Practice

## 2019-11-25 ENCOUNTER — Other Ambulatory Visit: Payer: Self-pay

## 2019-11-25 ENCOUNTER — Ambulatory Visit (INDEPENDENT_AMBULATORY_CARE_PROVIDER_SITE_OTHER): Payer: Medicare Other | Admitting: General Practice

## 2019-11-25 VITALS — BP 140/82 | HR 64 | Temp 97.0°F | Ht 63.0 in | Wt 153.0 lb

## 2019-11-25 DIAGNOSIS — Z01818 Encounter for other preprocedural examination: Secondary | ICD-10-CM | POA: Diagnosis not present

## 2019-11-25 DIAGNOSIS — E7800 Pure hypercholesterolemia, unspecified: Secondary | ICD-10-CM

## 2019-11-25 DIAGNOSIS — I10 Essential (primary) hypertension: Secondary | ICD-10-CM

## 2019-11-25 NOTE — Patient Instructions (Signed)
Medication Instructions:  Your physician recommends that you continue on your current medications as directed. Please refer to the Current Medication list given to you today.  *If you need a refill on your cardiac medications before your next appointment, please call your pharmacy*  Follow-Up: At [facility], you and your health needs are our priority.  As part of our continuing mission to provide you with exceptional heart care, we have created designated Provider Care Teams.  These Care Teams include your primary Cardiologist (physician) and Advanced Practice Providers (APPs -  Physician Assistants and Nurse Practitioners) who all work together to provide you with the care you need, when you need it.  We recommend signing up for the patient portal called "MyChart".  Sign up information is provided on this After Visit Summary.  MyChart is used to connect with patients for Virtual Visits (Telemedicine).  Patients are able to view lab/test results, encounter notes, upcoming appointments, etc.  Non-urgent messages can be sent to your provider as well.   To learn more about what you can do with MyChart, go to [email or web address].    Your next appointment:   6 month(s)  The format for your next appointment:   In Person  Provider:   You may see Kirk Ruths, MD or one of the following Advanced Practice Providers on your designated Care Team:    Kerin Ransom, PA-C  [place], [place]  Coletta Memos, [place]    Other Instructions

## 2019-12-05 NOTE — Telephone Encounter (Signed)
Patient calling stating Dr. Ruel Favors office has not received the clearance for her surgery.

## 2019-12-05 NOTE — Telephone Encounter (Signed)
   Primary Cardiologist: Kirk Ruths, MD  Chart reviewed as part of pre-operative protocol coverage. Given past medical history and time since last visit, based on ACC/AHA guidelines, [name] would be at acceptable risk for the planned procedure without further cardiovascular testing.   HisRCRI riskis classI,with a0.4% risk of a major cardiac event.  She is able to complete greater than 4 METS of physical activity.  I will route this recommendation to the requesting party via Epic fax function and remove from pre-op pool.  Please call with questions.  Jossie Ng. Ala Capri NP-C    12/05/2019, 2:21 PM [facility] [place] [address] Office [phone] Fax [phone]

## 2019-12-16 ENCOUNTER — Other Ambulatory Visit: Payer: Self-pay | Admitting: Orthopedic Surgery

## 2019-12-16 NOTE — Patient Instructions (Addendum)
DUE TO COVID-19 ONLY ONE VISITOR IS ALLOWED TO COME WITH YOU AND STAY IN THE WAITING ROOM ONLY DURING PRE OP AND PROCEDURE DAY OF SURGERY. Two  VISITOR MAY VISIT WITH YOU AFTER SURGERY IN YOUR PRIVATE ROOM DURING VISITING HOURS ONLY!  10a 8pm  YOU NEED TO HAVE A COVID 19 TEST ON   6-3-21_______ @__1005_____ , THIS TEST MUST BE DONE BEFORE SURGERY, COME  [address], [place] , [address].  ([place]) ONCE YOUR COVID TEST IS COMPLETED, PLEASE BEGIN THE QUARANTINE INSTRUCTIONS AS OUTLINED IN YOUR HANDOUT.                [name]  12/16/2019   Your procedure is scheduled on: 12-30-19   Report to [facility] Main  Entrance   Report to short stay  at 0530 AM     Call this number if you have problems the morning of surgery [phone]    Remember:  NO SOLID FOOD AFTER MIDNIGHT THE NIGHT PRIOR TO SURGERY. NOTHING BY MOUTH EXCEPT CLEAR LIQUIDS UNTIL  0500 am  . PLEASE FINISH ENSURE DRINK PER SURGEON ORDER  WHICH NEEDS TO BE COMPLETED AT    0500 am then nothing by mouth .    CLEAR LIQUID DIET   Foods Allowed                                                                                     Foods Excluded   Not allowed  Coffee and tea, regular and decaf NO CREAMER                            liquids that you cannot  Plain Jell-O any favor except red or purple                                           see through such as: Fruit ices (not with fruit pulp)                                                            milk, soups, orange juice  Iced Popsicles                                                           All solid food Carbonated beverages, regular and diet                                    Cranberry, grape and apple juices Sports drinks like Gatorade Lightly seasoned clear broth or consume(fat free) Sugar, honey syrup  _____________________________________________________________________   BRUSH YOUR TEETH MORNING OF SURGERY AND RINSE YOUR  MOUTH OUT, NO CHEWING GUM CANDY OR MINTS.     Take these medicines the morning of surgery with A SIP OF WATER: inhalers and bring , zoloft, pantoprazole(protonix), metoprolol, reglan, neurotin, amlodipine                                  You may not have any metal on your body including hair pins and              piercings  Do not wear jewelry, make-up, lotions, powders or perfumes, deodorant             Do not wear nail polish on your fingernails.  Do not shave  48 hours prior to surgery.                Do not bring valuables to the hospital. [place].  Contacts, dentures or bridgework may not be worn into surgery.  Leave suitcase in the car. After surgery it may be brought to your room.     Patients discharged the day of surgery will not be allowed to drive home. IF YOU ARE HAVING SURGERY AND GOING HOME THE SAME DAY, YOU MUST HAVE AN ADULT TO DRIVE YOU HOME AND BE WITH YOU FOR 24 HOURS. YOU MAY GO HOME BY TAXI OR UBER OR ORTHERWISE, BUT AN ADULT MUST ACCOMPANY YOU HOME AND STAY WITH YOU FOR 24 HOURS.  Name and phone number of your driver:  Special Instructions: N/A              Please read over the following fact sheets you were given: _____________________________________________________________________             [facility] - Preparing for Surgery Before surgery, you can play an important role.  Because skin is not sterile, your skin needs to be as free of germs as possible.  You can reduce the number of germs on your skin by washing with CHG (chlorahexidine gluconate) soap before surgery.  CHG is an antiseptic cleaner which kills germs and bonds with the skin to continue killing germs even after washing. Please DO NOT use if you have an allergy to CHG or antibacterial soaps.  If your skin becomes reddened/irritated stop using the CHG and inform your nurse when you arrive at Short Stay. Do not shave (including legs and underarms)  for at least 48 hours prior to the first CHG shower.  You may shave your face/neck. Please follow these instructions carefully:  1.  Shower with CHG Soap the night before surgery and the  morning of Surgery.  2.  If you choose to wash your hair, wash your hair first as usual with your  normal  shampoo.  3.  After you shampoo, rinse your hair and body thoroughly to remove the  shampoo.                           4.  Use CHG as you would any other liquid soap.  You can apply chg directly  to the skin and wash                       Gently with a scrungie  or clean washcloth.  5.  Apply the CHG Soap to your body ONLY FROM THE NECK DOWN.   Do not use on face/ open                           Wound or open sores. Avoid contact with eyes, ears mouth and genitals (private parts).                       Wash face,  Genitals (private parts) with your normal soap.             6.  Wash thoroughly, paying special attention to the area where your surgery  will be performed.  7.  Thoroughly rinse your body with warm water from the neck down.  8.  DO NOT shower/wash with your normal soap after using and rinsing off  the CHG Soap.                9.  Pat yourself dry with a clean towel.            10.  Wear clean pajamas.            11.  Place clean sheets on your bed the night of your first shower and do not  sleep with pets. Day of Surgery : Do not apply any lotions/deodorants the morning of surgery.  Please wear clean clothes to the hospital/surgery center.  FAILURE TO FOLLOW THESE INSTRUCTIONS MAY RESULT IN THE CANCELLATION OF YOUR SURGERY PATIENT SIGNATURE_________________________________  NURSE SIGNATURE__________________________________  ________________________________________________________________________   [name]  An incentive spirometer is a tool that can help keep your lungs clear and active. This tool measures how well you are filling your lungs with each breath. Taking long deep  breaths may help reverse or decrease the chance of developing breathing (pulmonary) problems (especially infection) following:  A long period of time when you are unable to move or be active. BEFORE THE PROCEDURE   If the spirometer includes an indicator to show your best effort, your nurse or respiratory therapist will set it to a desired goal.  If possible, sit up straight or lean slightly forward. Try not to slouch.  Hold the incentive spirometer in an upright position. INSTRUCTIONS FOR USE  1. Sit on the edge of your bed if possible, or sit up as far as you can in bed or on a chair. 2. Hold the incentive spirometer in an upright position. 3. Breathe out normally. 4. Place the mouthpiece in your mouth and seal your lips tightly around it. 5. Breathe in slowly and as deeply as possible, raising the piston or the ball toward the top of the column. 6. Hold your breath for 3-5 seconds or for as long as possible. Allow the piston or ball to fall to the bottom of the column. 7. Remove the mouthpiece from your mouth and breathe out normally. 8. Rest for a few seconds and repeat Steps 1 through 7 at least 10 times every 1-2 hours when you are awake. Take your time and take a few normal breaths between deep breaths. 9. The spirometer may include an indicator to show your best effort. Use the indicator as a goal to work toward during each repetition. 10. After each set of 10 deep breaths, practice coughing to be sure your lungs are clear. If you have an incision (the cut made at the time of surgery), support your incision when  coughing by placing a pillow or rolled up towels firmly against it. Once you are able to get out of bed, walk around indoors and cough well. You may stop using the incentive spirometer when instructed by your caregiver.  RISKS AND COMPLICATIONS  Take your time so you do not get dizzy or light-headed.  If you are in pain, you may need to take or ask for pain medication before  doing incentive spirometry. It is harder to take a deep breath if you are having pain. AFTER USE  Rest and breathe slowly and easily.  It can be helpful to keep track of a log of your progress. Your caregiver can provide you with a simple table to help with this. If you are using the spirometer at home, follow these instructions: [place] IF:   You are having difficultly using the spirometer.  You have trouble using the spirometer as often as instructed.  Your pain medication is not giving enough relief while using the spirometer.  You develop fever of 100.5 F (38.1 C) or higher. SEEK IMMEDIATE MEDICAL CARE IF:   You cough up bloody sputum that had not been present before.  You develop fever of 102 F (38.9 C) or greater.  You develop worsening pain at or near the incision site. MAKE SURE YOU:   Understand these instructions.  Will watch your condition.  Will get help right away if you are not doing well or get worse. Document Released: 11/21/2006 Document Revised: 10/03/2011 Document Reviewed: 01/22/2007 ExitCare Patient Information 2014 ExitCare, [place].   ________________________________________________________________________  WHAT IS A BLOOD TRANSFUSION? Blood Transfusion Information  A transfusion is the replacement of blood or some of its parts. Blood is made up of multiple cells which provide different functions.  Red blood cells carry oxygen and are used for blood loss replacement.  White blood cells fight against infection.  Platelets control bleeding.  Plasma helps clot blood.  Other blood products are available for specialized needs, such as hemophilia or other clotting disorders. BEFORE THE TRANSFUSION  Who gives blood for transfusions?   Healthy volunteers who are fully evaluated to make sure their blood is safe. This is blood bank blood. Transfusion therapy is the safest it has ever been in the practice of medicine. Before blood is taken  from a donor, a complete history is taken to make sure that person has no history of diseases nor engages in risky social behavior (examples are intravenous drug use or sexual activity with multiple partners). The donor's travel history is screened to minimize risk of transmitting infections, such as malaria. The donated blood is tested for signs of infectious diseases, such as HIV and hepatitis. The blood is then tested to be sure it is compatible with you in order to minimize the chance of a transfusion reaction. If you or a relative donates blood, this is often done in anticipation of surgery and is not appropriate for emergency situations. It takes many days to process the donated blood. RISKS AND COMPLICATIONS Although transfusion therapy is very safe and saves many lives, the main dangers of transfusion include:   Getting an infectious disease.  Developing a transfusion reaction. This is an allergic reaction to something in the blood you were given. Every precaution is taken to prevent this. The decision to have a blood transfusion has been considered carefully by your caregiver before blood is given. Blood is not given unless the benefits outweigh the risks. AFTER THE TRANSFUSION  Right after receiving  a blood transfusion, you will usually feel much better and more energetic. This is especially true if your red blood cells have gotten low (anemic). The transfusion raises the level of the red blood cells which carry oxygen, and this usually causes an energy increase.  The nurse administering the transfusion will monitor you carefully for complications. HOME CARE INSTRUCTIONS  No special instructions are needed after a transfusion. You may find your energy is better. Speak with your caregiver about any limitations on activity for underlying diseases you may have. SEEK MEDICAL CARE IF:   Your condition is not improving after your transfusion.  You develop redness or irritation at the  intravenous (IV) site. SEEK IMMEDIATE MEDICAL CARE IF:  Any of the following symptoms occur over the next 12 hours:  Shaking chills.  You have a temperature by mouth above 102 F (38.9 C), not controlled by medicine.  Chest, back, or muscle pain.  People around you feel you are not acting correctly or are confused.  Shortness of breath or difficulty breathing.  Dizziness and fainting.  You get a rash or develop hives.  You have a decrease in urine output.  Your urine turns a dark color or changes to pink, red, or brown. Any of the following symptoms occur over the next 10 days:  You have a temperature by mouth above 102 F (38.9 C), not controlled by medicine.  Shortness of breath.  Weakness after normal activity.  The white part of the eye turns yellow (jaundice).  You have a decrease in the amount of urine or are urinating less often.  Your urine turns a dark color or changes to pink, red, or brown. Document Released: 07/08/2000 Document Revised: 10/03/2011 Document Reviewed: 02/25/2008 [facility] Patient Information 2014 [place], [place].  _______________________________________________________________________

## 2019-12-16 NOTE — Progress Notes (Signed)
PCP - Wenda Low, MD Cardiologist - Kirk Ruths lov 11-25-19 with Coletta Memos ,NP and clearance note  Chest x-ray - 1 view 09-14-19 epic EKG - 11-25-19 epic Stress Test -  ECHO - 01-23-18 epic Cardiac Cath - 12-11-17 epic Sleep Study -  CPAP -   Fasting Blood Sugar -  Checks Blood Sugar _____ times a day  Blood Thinner Instructions: Aspirin Instructions:325 asa stop 1 week prior to surgery Last Dose:  Anesthesia review: RUL lobectomy, copd asthma   Patient denies shortness of breath, fever, cough and chest pain at PAT appointment  none   Patient verbalized understanding of instructions that were given to them at the PAT appointment. Patient was also instructed that they will need to review over the PAT instructions again at home before surgery.

## 2019-12-17 ENCOUNTER — Other Ambulatory Visit: Payer: Self-pay

## 2019-12-17 ENCOUNTER — Encounter (HOSPITAL_COMMUNITY)
Admission: RE | Admit: 2019-12-17 | Discharge: 2019-12-17 | Disposition: A | Discharge status: Home / Self Care | Payer: Medicare Other | Source: Ambulatory Visit | Attending: Orthopedic Surgery | Admitting: Orthopedic Surgery

## 2019-12-17 ENCOUNTER — Encounter (HOSPITAL_COMMUNITY): Payer: Self-pay

## 2019-12-17 DIAGNOSIS — Z01812 Encounter for preprocedural laboratory examination: Secondary | ICD-10-CM | POA: Insufficient documentation

## 2019-12-17 LAB — CBC WITH DIFFERENTIAL/PLATELET
Abs Immature Granulocytes: 0.03 10*3/uL (ref 0.00–0.07)
Basophils Absolute: 0.1 10*3/uL (ref 0.0–0.1)
Basophils Relative: 1 %
Eosinophils Absolute: 0.3 10*3/uL (ref 0.0–0.5)
Eosinophils Relative: 4 %
HCT: 33.8 % — ABNORMAL LOW (ref 36.0–46.0)
Hemoglobin: 10 g/dL — ABNORMAL LOW (ref 12.0–15.0)
Immature Granulocytes: 0 %
Lymphocytes Relative: 24 %
Lymphs Abs: 1.9 10*3/uL (ref 0.7–4.0)
MCH: 26.2 pg (ref 26.0–34.0)
MCHC: 29.6 g/dL — ABNORMAL LOW (ref 30.0–36.0)
MCV: 88.7 fL (ref 80.0–100.0)
Monocytes Absolute: 0.6 10*3/uL (ref 0.1–1.0)
Monocytes Relative: 8 %
Neutro Abs: 5.2 10*3/uL (ref 1.7–7.7)
Neutrophils Relative %: 63 %
Platelets: 305 10*3/uL (ref 150–400)
RBC: 3.81 MIL/uL — ABNORMAL LOW (ref 3.87–5.11)
RDW: 16.2 % — ABNORMAL HIGH (ref 11.5–15.5)
WBC: 8.1 10*3/uL (ref 4.0–10.5)
nRBC: 0 % (ref 0.0–0.2)

## 2019-12-17 LAB — COMPREHENSIVE METABOLIC PANEL
ALT: 8 U/L (ref 0–44)
AST: 14 U/L — ABNORMAL LOW (ref 15–41)
Albumin: 3.4 g/dL — ABNORMAL LOW (ref 3.5–5.0)
Alkaline Phosphatase: 63 U/L (ref 38–126)
Anion gap: 8 (ref 5–15)
BUN: 22 mg/dL (ref 8–23)
CO2: 28 mmol/L (ref 22–32)
Calcium: 8.2 mg/dL — ABNORMAL LOW (ref 8.9–10.3)
Chloride: 105 mmol/L (ref 98–111)
Creatinine, Ser: 1.3 mg/dL — ABNORMAL HIGH (ref 0.44–1.00)
GFR calc Af Amer: 46 mL/min — ABNORMAL LOW (ref >60)
GFR calc non Af Amer: 40 mL/min — ABNORMAL LOW (ref >60)
Glucose, Bld: 104 mg/dL — ABNORMAL HIGH (ref 70–99)
Potassium: 4.2 mmol/L (ref 3.5–5.1)
Sodium: 141 mmol/L (ref 135–145)
Total Bilirubin: 0.5 mg/dL (ref 0.3–1.2)
Total Protein: 7.4 g/dL (ref 6.5–8.1)

## 2019-12-18 LAB — SURGICAL PCR SCREEN
MRSA, PCR: POSITIVE — AB
Staphylococcus aureus: POSITIVE — AB

## 2019-12-18 NOTE — Progress Notes (Signed)
Anesthesia Chart Review   Case: [id number] Date/Time: 12/30/19 0745   Procedure: TOTAL KNEE ARTHROPLASTY (Left Knee)   Anesthesia type: Spinal   Pre-op diagnosis: Osteoarthritis left knee   Location[facility] ROOM 05 / [facility]   Surgeons: Vickey Huger, MD      DISCUSSION:79 y.o. former smoker (102 pack years, quit 05/04/12) with h/o HTN, GERD, COPD, HLD, asthma, Takotsubo cardiomyopathy (EF recovered to 50-55% on 01/23/18 Echo), nonobstructive CAD by cath 2019, left knee OA scheduled for above procedure 12/30/2019 with Dr. Vickey Huger.   Pt last seen by cardiology 11/25/2019.  Per OV note, "left total knee artroplasty.Patient statesshe isnotable to walk up 2 flights of stairs or walk 4 city blocksto complete 4 METS of physical activity, due to her knee pain.She underwent cardiac ischemic evaluation on 5/20/2019which showed nonobstructive coronary disease. Her echocardiogram on 01/23/2018 showed an ejection fraction of 50 to 55% and grade 1 diastolic dysfunction. Therefore,no further cardiac evaluationisrecommended at this time.HisRCRI riskis classI,with a0.4% risk of a major cardiac event."  Anticipate pt can proceed with planned procedure barring acute status change.   VS: BP (!) 156/73   Pulse 65   Temp 37 C (Oral)   Resp 18   Ht 5\' 2"  (1.575 m)   SpO2 100%   BMI 26.52 kg/m   PROVIDERS: Wenda Low, MD is PCP   Kirk Ruths, MD is Cardiologist  LABS: Labs reviewed: Acceptable for surgery. (all labs ordered are listed, but only abnormal results are displayed)  Labs Reviewed - No data to display   IMAGES:   EKG: 11/25/2019 Rate 65 bpm Normal sinus rhythm   CV: Echo 01/23/2018 Study Conclusions   - Left ventricle: The cavity size was normal. Wall thickness was  normal. Systolic function was normal. The estimated ejection  fraction was in the range of 50% to 55%. Moderate hypokinesis of  the midanteroseptal myocardium. Doppler parameters are consistent   with abnormal left ventricular relaxation (grade 1 diastolic  dysfunction).   Cardiac Cath 12/11/2017  Prox RCA lesion is 25% stenosed.  Mid LAD lesion is 25% stenosed.  There is moderate left ventricular systolic dysfunction.  LV end diastolic pressure is moderately elevated.  The left ventricular ejection fraction is 25-35% by visual estimate, with apical hypokinesis.  There is no aortic valve stenosis.   Nonobstructive CAD.  Takotsubo cardiomyopathy pattern.  Continue medical therapy.  Past Medical History:  Diagnosis Date  . Adenocarcinoma of right lung, stage 1 ([place]) 07/28/2016  . Anxiety   . Asthma   . Cancer ([place])    uterine  . COPD (chronic obstructive pulmonary disease) ([place])   . Coronary artery disease    mild nonobstructive by 2019 cath  . Cyst    left side of neck  . Depression   . GERD (gastroesophageal reflux disease)   . Heart murmur   . Hyperlipidemia   . Hypertension   . Low back pain   . Osteoarthritis   . Osteoporosis   . Takotsubo cardiomyopathy    EF recovered to 50-55% by 01/23/18 echo    Past Surgical History:  Procedure Laterality Date  . ANKLE SURGERY Right    horse accident  . APPENDECTOMY    . BACK SURGERY    . CHOLECYSTECTOMY    . EYE SURGERY     lense implant  . FRACTURE SURGERY    . HIP SURGERY  01/2010   Left Hip   . INNER EAR SURGERY Bilateral 1970   related to severe  ear infections  . KNEE SURGERY     Right knee  . LEFT HEART CATH AND CORONARY ANGIOGRAPHY N/A 12/11/2017   Procedure: LEFT HEART CATH AND CORONARY ANGIOGRAPHY;  Surgeon: Jettie Booze, MD;  Location: [facility];  Service: Cardiovascular;  Laterality: N/A;  . LOBECTOMY Right 05/09/2016   Procedure: RIGHT UPPER LOBECTOMY;  Surgeon: [name], MD;  Location: [facility];  Service: Thoracic;  Laterality: Right;  . MASS EXCISION  08/25/2011   Procedure: EXCISION MASS;  Surgeon: Harl Bowie, MD;  Location: [facility];   Service: General;  Laterality: N/A;  excision left neck mass  . TOTAL ABDOMINAL HYSTERECTOMY    . TOTAL KNEE ARTHROPLASTY Right 05/27/2019   Procedure: TOTAL KNEE ARTHROPLASTY;  Surgeon: Vickey Huger, MD;  [facility];  Service: Orthopedics;  Laterality: Right;  75 mins needed for length of case  . VIDEO ASSISTED THORACOSCOPY (VATS)/WEDGE RESECTION Right 05/09/2016   Procedure: VIDEO ASSISTED THORACOSCOPY (VATS)/WEDGE RESECTION;  Surgeon: [name], MD;  Location: [facility];  Service: Thoracic;  Laterality: Right;    MEDICATIONS: . acetaminophen (TYLENOL) 500 MG tablet  . albuterol (PROVENTIL HFA;VENTOLIN HFA) 108 (90 BASE) MCG/ACT inhaler  . albuterol (PROVENTIL) (2.5 MG/3ML) 0.083% nebulizer solution  . aluminum-magnesium hydroxide-simethicone (MAALOX) 790-240-97 MG/5ML SUSP  . amLODipine (NORVASC) 5 MG tablet  . aspirin EC 325 MG EC tablet  . atorvastatin (LIPITOR) 20 MG tablet  . Calcium Carb-Cholecalciferol (CALCIUM-VITAMIN D) 600-400 MG-UNIT TABS  . diazepam (VALIUM) 5 MG tablet  . ferrous sulfate 325 (65 FE) MG tablet  . furosemide (LASIX) 20 MG tablet  . gabapentin (NEURONTIN) 400 MG capsule  . lisinopril (PRINIVIL,ZESTRIL) 10 MG tablet  . meloxicam (MOBIC) 7.5 MG tablet  . metoCLOPramide (REGLAN) 5 MG tablet  . metoprolol tartrate (LOPRESSOR) 25 MG tablet  . mirtazapine (REMERON) 7.5 MG tablet  . ondansetron (ZOFRAN ODT) 4 MG disintegrating tablet  . oxybutynin (DITROPAN-XL) 5 MG 24 hr tablet  . pantoprazole (PROTONIX) 40 MG tablet  . raloxifene (EVISTA) 60 MG tablet  . sertraline (ZOLOFT) 100 MG tablet  . tiZANidine (ZANAFLEX) 2 MG tablet  . traMADol (ULTRAM) 50 MG tablet  . TRELEGY ELLIPTA 100-62.5-25 MCG/INH AEPB   No current facility-administered medications for this encounter.    Maia Plan [facility] Pre-Surgical Testing [phone] 12/18/19  2:37 PM

## 2019-12-26 ENCOUNTER — Other Ambulatory Visit (HOSPITAL_COMMUNITY): Payer: Medicare Other

## 2019-12-27 NOTE — Progress Notes (Signed)
Called patient and her son. Left message to call back regarding covid test for 12/30/19 surgery. Patient did not keep her 12/26/19 appt for covid test.

## 2019-12-29 MED ORDER — BUPIVACAINE LIPOSOME 1.3 % IJ SUSP
20.0000 mL | Freq: Once | INTRAMUSCULAR | Status: AC
  Filled 2019-12-29: qty 20

## 2019-12-29 NOTE — Anesthesia Preprocedure Evaluation (Addendum)
Anesthesia Evaluation  Patient identified by MRN, date of birth, ID band Patient awake    Reviewed: Allergy & Precautions, NPO status , Patient's Chart, lab work & pertinent test results  Airway Mallampati: II  TM Distance: >3 FB Neck ROM: Full    Dental no notable dental hx. (+) Lower Dentures, Upper Dentures   Pulmonary asthma , COPD,  COPD inhaler, former smoker,    Pulmonary exam normal breath sounds clear to auscultation       Cardiovascular Exercise Tolerance: Good hypertension, Pt. on medications and Pt. on home beta blockers + CAD and + Past MI  Normal cardiovascular exam Rhythm:Regular Rate:Normal  7/19 Echo  Left ventricle: The cavity size was normal. Wall thickness was  normal. Systolic function was normal. The estimated ejection  fraction was in the range of 50% to 55%. Moderate hypokinesis of  the midanteroseptal myocardium. Doppler parameters are consistent  with abnormal left ventricular relaxation (grade 1 diastolic  dysfunction).    Neuro/Psych PSYCHIATRIC DISORDERS negative neurological ROS     GI/Hepatic Neg liver ROS, hiatal hernia, GERD  ,  Endo/Other  negative endocrine ROS  Renal/GU K+ 4.2 Cr 1.30     Musculoskeletal  (+) Arthritis ,   Abdominal   Peds  Hematology Lab Results      Component                Value               Date                      WBC                      8.1                 12/17/2019                HGB                      10.0 (L)            12/17/2019                HCT                      33.8 (L)            12/17/2019                MCV                      88.7                12/17/2019                PLT                      305                 12/17/2019             Anesthesia Other Findings   Reproductive/Obstetrics negative OB ROS                            Anesthesia Physical Anesthesia Plan  ASA: III  Anesthesia  Plan: Spinal   Post-op Pain Management:    Induction: Intravenous  PONV  Risk Score and Plan: 3 and Treatment may vary due to age or medical condition, Ondansetron and Dexamethasone  Airway Management Planned: Nasal Cannula and Natural Airway  Additional Equipment: None  Intra-op Plan:   Post-operative Plan:   Informed Consent: I have reviewed the patients History and Physical, chart, labs and discussed the procedure including the risks, benefits and alternatives for the proposed anesthesia with the patient or authorized representative who has indicated his/her understanding and acceptance.     Dental advisory given  Plan Discussed with: CRNA  Anesthesia Plan Comments: (Spinal w L adductor canal)       Anesthesia Quick Evaluation

## 2019-12-30 ENCOUNTER — Observation Stay (HOSPITAL_COMMUNITY)
Admission: RE | Admit: 2019-12-30 | Discharge: 2019-12-31 | Disposition: A | Discharge status: Home / Self Care | Payer: Medicare Other | Source: Ambulatory Visit | Attending: Orthopedic Surgery | Admitting: Orthopedic Surgery

## 2019-12-30 ENCOUNTER — Ambulatory Visit (HOSPITAL_COMMUNITY): Payer: Medicare Other | Admitting: Anesthesiology

## 2019-12-30 ENCOUNTER — Encounter (HOSPITAL_COMMUNITY): Payer: Self-pay | Admitting: Orthopedic Surgery

## 2019-12-30 ENCOUNTER — Ambulatory Visit (HOSPITAL_COMMUNITY): Payer: Medicare Other | Admitting: Physician Assistant

## 2019-12-30 ENCOUNTER — Other Ambulatory Visit: Payer: Self-pay

## 2019-12-30 ENCOUNTER — Encounter (HOSPITAL_COMMUNITY)
Admission: RE | Disposition: A | Discharge status: Home / Self Care | Payer: Self-pay | Source: Ambulatory Visit | Attending: Orthopedic Surgery

## 2019-12-30 DIAGNOSIS — Z803 Family history of malignant neoplasm of breast: Secondary | ICD-10-CM | POA: Insufficient documentation

## 2019-12-30 DIAGNOSIS — Z90710 Acquired absence of both cervix and uterus: Secondary | ICD-10-CM | POA: Insufficient documentation

## 2019-12-30 DIAGNOSIS — E7800 Pure hypercholesterolemia, unspecified: Secondary | ICD-10-CM | POA: Diagnosis not present

## 2019-12-30 DIAGNOSIS — I2510 Atherosclerotic heart disease of native coronary artery without angina pectoris: Secondary | ICD-10-CM | POA: Insufficient documentation

## 2019-12-30 DIAGNOSIS — Z881 Allergy status to other antibiotic agents status: Secondary | ICD-10-CM | POA: Insufficient documentation

## 2019-12-30 DIAGNOSIS — Z20822 Contact with and (suspected) exposure to covid-19: Secondary | ICD-10-CM | POA: Diagnosis not present

## 2019-12-30 DIAGNOSIS — Z79899 Other long term (current) drug therapy: Secondary | ICD-10-CM | POA: Insufficient documentation

## 2019-12-30 DIAGNOSIS — M5114 Intervertebral disc disorders with radiculopathy, thoracic region: Secondary | ICD-10-CM | POA: Insufficient documentation

## 2019-12-30 DIAGNOSIS — F411 Generalized anxiety disorder: Secondary | ICD-10-CM | POA: Diagnosis not present

## 2019-12-30 DIAGNOSIS — Z9049 Acquired absence of other specified parts of digestive tract: Secondary | ICD-10-CM | POA: Insufficient documentation

## 2019-12-30 DIAGNOSIS — Z8249 Family history of ischemic heart disease and other diseases of the circulatory system: Secondary | ICD-10-CM | POA: Insufficient documentation

## 2019-12-30 DIAGNOSIS — D631 Anemia in chronic kidney disease: Secondary | ICD-10-CM | POA: Diagnosis not present

## 2019-12-30 DIAGNOSIS — M5136 Other intervertebral disc degeneration, lumbar region: Secondary | ICD-10-CM | POA: Insufficient documentation

## 2019-12-30 DIAGNOSIS — K449 Diaphragmatic hernia without obstruction or gangrene: Secondary | ICD-10-CM | POA: Insufficient documentation

## 2019-12-30 DIAGNOSIS — M810 Age-related osteoporosis without current pathological fracture: Secondary | ICD-10-CM | POA: Diagnosis not present

## 2019-12-30 DIAGNOSIS — N183 Chronic kidney disease, stage 3 unspecified: Secondary | ICD-10-CM | POA: Insufficient documentation

## 2019-12-30 DIAGNOSIS — F329 Major depressive disorder, single episode, unspecified: Secondary | ICD-10-CM | POA: Insufficient documentation

## 2019-12-30 DIAGNOSIS — Z87891 Personal history of nicotine dependence: Secondary | ICD-10-CM | POA: Insufficient documentation

## 2019-12-30 DIAGNOSIS — E785 Hyperlipidemia, unspecified: Secondary | ICD-10-CM | POA: Diagnosis not present

## 2019-12-30 DIAGNOSIS — I252 Old myocardial infarction: Secondary | ICD-10-CM | POA: Diagnosis not present

## 2019-12-30 DIAGNOSIS — J449 Chronic obstructive pulmonary disease, unspecified: Secondary | ICD-10-CM | POA: Insufficient documentation

## 2019-12-30 DIAGNOSIS — Z96659 Presence of unspecified artificial knee joint: Secondary | ICD-10-CM | POA: Diagnosis present

## 2019-12-30 DIAGNOSIS — Z7982 Long term (current) use of aspirin: Secondary | ICD-10-CM | POA: Insufficient documentation

## 2019-12-30 DIAGNOSIS — G894 Chronic pain syndrome: Secondary | ICD-10-CM | POA: Insufficient documentation

## 2019-12-30 DIAGNOSIS — M1712 Unilateral primary osteoarthritis, left knee: Secondary | ICD-10-CM | POA: Diagnosis not present

## 2019-12-30 DIAGNOSIS — E1122 Type 2 diabetes mellitus with diabetic chronic kidney disease: Secondary | ICD-10-CM | POA: Diagnosis not present

## 2019-12-30 DIAGNOSIS — I130 Hypertensive heart and chronic kidney disease with heart failure and stage 1 through stage 4 chronic kidney disease, or unspecified chronic kidney disease: Secondary | ICD-10-CM | POA: Insufficient documentation

## 2019-12-30 DIAGNOSIS — K219 Gastro-esophageal reflux disease without esophagitis: Secondary | ICD-10-CM | POA: Diagnosis not present

## 2019-12-30 DIAGNOSIS — Z85118 Personal history of other malignant neoplasm of bronchus and lung: Secondary | ICD-10-CM | POA: Insufficient documentation

## 2019-12-30 DIAGNOSIS — Z902 Acquired absence of lung [part of]: Secondary | ICD-10-CM | POA: Insufficient documentation

## 2019-12-30 DIAGNOSIS — I5021 Acute systolic (congestive) heart failure: Secondary | ICD-10-CM | POA: Diagnosis not present

## 2019-12-30 DIAGNOSIS — Z888 Allergy status to other drugs, medicaments and biological substances status: Secondary | ICD-10-CM | POA: Insufficient documentation

## 2019-12-30 DIAGNOSIS — J441 Chronic obstructive pulmonary disease with (acute) exacerbation: Secondary | ICD-10-CM | POA: Diagnosis not present

## 2019-12-30 DIAGNOSIS — Z791 Long term (current) use of non-steroidal anti-inflammatories (NSAID): Secondary | ICD-10-CM | POA: Insufficient documentation

## 2019-12-30 HISTORY — PX: TOTAL KNEE ARTHROPLASTY: SHX125

## 2019-12-30 LAB — SARS CORONAVIRUS 2 BY RT PCR (HOSPITAL ORDER, PERFORMED IN ~~LOC~~ HOSPITAL LAB): SARS Coronavirus 2: NEGATIVE

## 2019-12-30 SURGERY — ARTHROPLASTY, KNEE, TOTAL
Anesthesia: Spinal | Site: Knee | Laterality: Left

## 2019-12-30 MED ORDER — DIPHENHYDRAMINE HCL 12.5 MG/5ML PO ELIX: 12.5000 mg | ORAL_SOLUTION | ORAL | Status: DC | PRN

## 2019-12-30 MED ORDER — GABAPENTIN 300 MG PO CAPS: 300.0000 mg | ORAL_CAPSULE | Freq: Three times a day (TID) | ORAL | Status: DC

## 2019-12-30 MED ORDER — PHENOL 1.4 % MT LIQD: 1.0000 | OROMUCOSAL | Status: DC | PRN

## 2019-12-30 MED ORDER — BUPIVACAINE HCL (PF) 0.25 % IJ SOLN
INTRAMUSCULAR | Status: DC | PRN
  Administered 2019-12-30: 15 mL

## 2019-12-30 MED ORDER — OXYBUTYNIN CHLORIDE ER 5 MG PO TB24
5.0000 mg | ORAL_TABLET | Freq: Every day | ORAL | Status: DC
  Administered 2019-12-30: 5 mg via ORAL
  Filled 2019-12-30: qty 1

## 2019-12-30 MED ORDER — POVIDONE-IODINE 10 % EX SWAB
2.0000 "application " | Freq: Once | CUTANEOUS | Status: AC
  Administered 2019-12-30: 2 via TOPICAL

## 2019-12-30 MED ORDER — TRANEXAMIC ACID-NACL 1000-0.7 MG/100ML-% IV SOLN
1000.0000 mg | Freq: Once | INTRAVENOUS | Status: AC
  Administered 2019-12-30: 1000 mg via INTRAVENOUS
  Filled 2019-12-30: qty 100

## 2019-12-30 MED ORDER — PANTOPRAZOLE SODIUM 40 MG PO TBEC
40.0000 mg | DELAYED_RELEASE_TABLET | Freq: Every day | ORAL | Status: DC
  Administered 2019-12-31: 40 mg via ORAL
  Filled 2019-12-30: qty 1

## 2019-12-30 MED ORDER — BUPIVACAINE IN DEXTROSE 0.75-8.25 % IT SOLN
INTRATHECAL | Status: DC | PRN
Start: 2019-12-30 — End: 2019-12-30
  Administered 2019-12-30: 1.6 mL via INTRATHECAL

## 2019-12-30 MED ORDER — GABAPENTIN 400 MG PO CAPS
400.0000 mg | ORAL_CAPSULE | Freq: Four times a day (QID) | ORAL | Status: DC
  Administered 2019-12-30 – 2019-12-31 (×4): 400 mg via ORAL
  Filled 2019-12-30 (×4): qty 1

## 2019-12-30 MED ORDER — PROPOFOL 10 MG/ML IV BOLUS
INTRAVENOUS | Status: AC
  Filled 2019-12-30: qty 20

## 2019-12-30 MED ORDER — SODIUM CHLORIDE (PF) 0.9 % IJ SOLN
INTRAMUSCULAR | Status: AC
  Filled 2019-12-30: qty 20

## 2019-12-30 MED ORDER — PROPOFOL 10 MG/ML IV BOLUS
INTRAVENOUS | Status: DC | PRN
  Administered 2019-12-30: 20 mg via INTRAVENOUS
  Administered 2019-12-30: 10 mg via INTRAVENOUS
  Administered 2019-12-30: 20 mg via INTRAVENOUS
  Administered 2019-12-30: 30 mg via INTRAVENOUS
  Administered 2019-12-30: 20 mg via INTRAVENOUS

## 2019-12-30 MED ORDER — ONDANSETRON HCL 4 MG/2ML IJ SOLN: 4.0000 mg | Freq: Four times a day (QID) | INTRAMUSCULAR | Status: DC | PRN

## 2019-12-30 MED ORDER — FENTANYL CITRATE (PF) 100 MCG/2ML IJ SOLN: 25.0000 ug | INTRAMUSCULAR | Status: DC | PRN

## 2019-12-30 MED ORDER — ORAL CARE MOUTH RINSE: 15.0000 mL | Freq: Once | OROMUCOSAL | Status: AC

## 2019-12-30 MED ORDER — HYDROMORPHONE HCL 1 MG/ML IJ SOLN
0.5000 mg | INTRAMUSCULAR | Status: DC | PRN
  Administered 2019-12-30: 1 mg via INTRAVENOUS
  Filled 2019-12-30: qty 1

## 2019-12-30 MED ORDER — FERROUS SULFATE 325 (65 FE) MG PO TABS
325.0000 mg | ORAL_TABLET | Freq: Three times a day (TID) | ORAL | Status: DC
  Administered 2019-12-31 (×2): 325 mg via ORAL
  Filled 2019-12-30 (×2): qty 1

## 2019-12-30 MED ORDER — CELECOXIB 200 MG PO CAPS
400.0000 mg | ORAL_CAPSULE | Freq: Once | ORAL | Status: AC
  Administered 2019-12-30: 400 mg via ORAL
  Filled 2019-12-30: qty 2

## 2019-12-30 MED ORDER — UMECLIDINIUM BROMIDE 62.5 MCG/INH IN AEPB
1.0000 | INHALATION_SPRAY | Freq: Every day | RESPIRATORY_TRACT | Status: DC
  Administered 2019-12-31: 1 via RESPIRATORY_TRACT
  Filled 2019-12-30: qty 7

## 2019-12-30 MED ORDER — SODIUM CHLORIDE 0.9 % IR SOLN
Status: DC | PRN
  Administered 2019-12-30: 1000 mL

## 2019-12-30 MED ORDER — TRAMADOL HCL 50 MG PO TABS
50.0000 mg | ORAL_TABLET | Freq: Four times a day (QID) | ORAL | Status: DC
  Administered 2019-12-30 – 2019-12-31 (×5): 50 mg via ORAL
  Filled 2019-12-30 (×5): qty 1

## 2019-12-30 MED ORDER — OXYCODONE HCL 5 MG PO TABS
5.0000 mg | ORAL_TABLET | ORAL | Status: DC | PRN
  Administered 2019-12-30 (×2): 5 mg via ORAL
  Administered 2019-12-31: 10 mg via ORAL
  Administered 2019-12-31: 5 mg via ORAL
  Filled 2019-12-30: qty 2
  Filled 2019-12-30 (×2): qty 1

## 2019-12-30 MED ORDER — FLUTICASONE-UMECLIDIN-VILANT 100-62.5-25 MCG/INH IN AEPB: 1.0000 | INHALATION_SPRAY | Freq: Every day | RESPIRATORY_TRACT | Status: DC

## 2019-12-30 MED ORDER — CHLORHEXIDINE GLUCONATE 0.12 % MT SOLN
15.0000 mL | Freq: Once | OROMUCOSAL | Status: AC
  Administered 2019-12-30: 15 mL via OROMUCOSAL

## 2019-12-30 MED ORDER — PROPOFOL 500 MG/50ML IV EMUL
INTRAVENOUS | Status: AC
  Filled 2019-12-30: qty 50

## 2019-12-30 MED ORDER — ASPIRIN EC 325 MG PO TBEC
325.0000 mg | DELAYED_RELEASE_TABLET | Freq: Two times a day (BID) | ORAL | Status: DC
  Administered 2019-12-31: 325 mg via ORAL
  Filled 2019-12-30: qty 1

## 2019-12-30 MED ORDER — EPHEDRINE SULFATE-NACL 50-0.9 MG/10ML-% IV SOSY
PREFILLED_SYRINGE | INTRAVENOUS | Status: DC | PRN
  Administered 2019-12-30 (×4): 5 mg via INTRAVENOUS

## 2019-12-30 MED ORDER — PANTOPRAZOLE SODIUM 40 MG PO TBEC: 40.0000 mg | DELAYED_RELEASE_TABLET | Freq: Every day | ORAL | Status: DC

## 2019-12-30 MED ORDER — DEXAMETHASONE SODIUM PHOSPHATE 10 MG/ML IJ SOLN
10.0000 mg | Freq: Once | INTRAMUSCULAR | Status: AC
  Administered 2019-12-31: 10 mg via INTRAVENOUS
  Filled 2019-12-30: qty 1

## 2019-12-30 MED ORDER — WATER FOR IRRIGATION, STERILE IR SOLN
Status: DC | PRN
  Administered 2019-12-30: 2000 mL

## 2019-12-30 MED ORDER — LISINOPRIL 10 MG PO TABS
10.0000 mg | ORAL_TABLET | Freq: Every morning | ORAL | Status: DC
  Administered 2019-12-30 – 2019-12-31 (×2): 10 mg via ORAL
  Filled 2019-12-30 (×2): qty 1

## 2019-12-30 MED ORDER — MIDAZOLAM HCL 2 MG/2ML IJ SOLN
INTRAMUSCULAR | Status: AC
  Filled 2019-12-30: qty 2

## 2019-12-30 MED ORDER — BUPIVACAINE HCL 0.25 % IJ SOLN
INTRAMUSCULAR | Status: AC
  Filled 2019-12-30: qty 1

## 2019-12-30 MED ORDER — ONDANSETRON HCL 4 MG/2ML IJ SOLN
INTRAMUSCULAR | Status: DC | PRN
  Administered 2019-12-30: 4 mg via INTRAVENOUS

## 2019-12-30 MED ORDER — METOCLOPRAMIDE HCL 5 MG/ML IJ SOLN: 5.0000 mg | Freq: Three times a day (TID) | INTRAMUSCULAR | Status: DC | PRN

## 2019-12-30 MED ORDER — ROPIVACAINE HCL 7.5 MG/ML IJ SOLN
INTRAMUSCULAR | Status: DC | PRN
Start: 2019-12-30 — End: 2019-12-30
  Administered 2019-12-30: 20 mL via PERINEURAL

## 2019-12-30 MED ORDER — LIDOCAINE 2% (20 MG/ML) 5 ML SYRINGE
INTRAMUSCULAR | Status: AC
  Filled 2019-12-30: qty 5

## 2019-12-30 MED ORDER — ONDANSETRON HCL 4 MG/2ML IJ SOLN: 4.0000 mg | Freq: Once | INTRAMUSCULAR | Status: DC | PRN

## 2019-12-30 MED ORDER — ACETAMINOPHEN 500 MG PO TABS
1000.0000 mg | ORAL_TABLET | Freq: Four times a day (QID) | ORAL | Status: AC
  Administered 2019-12-30 – 2019-12-31 (×4): 1000 mg via ORAL
  Filled 2019-12-30 (×4): qty 2

## 2019-12-30 MED ORDER — EPHEDRINE 5 MG/ML INJ
INTRAVENOUS | Status: AC
  Filled 2019-12-30: qty 10

## 2019-12-30 MED ORDER — VANCOMYCIN HCL IN DEXTROSE 1-5 GM/200ML-% IV SOLN
1000.0000 mg | INTRAVENOUS | Status: AC
  Administered 2019-12-30: 1000 mg via INTRAVENOUS
  Filled 2019-12-30: qty 200

## 2019-12-30 MED ORDER — PROPOFOL 500 MG/50ML IV EMUL
INTRAVENOUS | Status: DC | PRN
  Administered 2019-12-30: 75 ug/kg/min via INTRAVENOUS

## 2019-12-30 MED ORDER — FLEET ENEMA 7-19 GM/118ML RE ENEM: 1.0000 | ENEMA | Freq: Once | RECTAL | Status: DC | PRN

## 2019-12-30 MED ORDER — SODIUM CHLORIDE 0.9 % IV SOLN: INTRAVENOUS | Status: DC

## 2019-12-30 MED ORDER — METOCLOPRAMIDE HCL 5 MG PO TABS
5.0000 mg | ORAL_TABLET | Freq: Two times a day (BID) | ORAL | Status: DC
  Administered 2019-12-30 – 2019-12-31 (×2): 5 mg via ORAL
  Filled 2019-12-30 (×2): qty 1

## 2019-12-30 MED ORDER — TRANEXAMIC ACID-NACL 1000-0.7 MG/100ML-% IV SOLN
1000.0000 mg | INTRAVENOUS | Status: AC
  Administered 2019-12-30: 1000 mg via INTRAVENOUS
  Filled 2019-12-30: qty 100

## 2019-12-30 MED ORDER — MIRTAZAPINE 7.5 MG PO TABS
7.5000 mg | ORAL_TABLET | Freq: Every day | ORAL | Status: DC
  Administered 2019-12-30: 7.5 mg via ORAL
  Filled 2019-12-30: qty 1

## 2019-12-30 MED ORDER — ONDANSETRON HCL 4 MG PO TABS: 4.0000 mg | ORAL_TABLET | Freq: Four times a day (QID) | ORAL | Status: DC | PRN

## 2019-12-30 MED ORDER — METOPROLOL TARTRATE 12.5 MG HALF TABLET
12.5000 mg | ORAL_TABLET | Freq: Every morning | ORAL | Status: DC
  Administered 2019-12-30 – 2019-12-31 (×2): 12.5 mg via ORAL
  Filled 2019-12-30 (×2): qty 1

## 2019-12-30 MED ORDER — 0.9 % SODIUM CHLORIDE (POUR BTL) OPTIME
TOPICAL | Status: DC | PRN
  Administered 2019-12-30: 1000 mL

## 2019-12-30 MED ORDER — ACETAMINOPHEN 10 MG/ML IV SOLN: 1000.0000 mg | Freq: Once | INTRAVENOUS | Status: DC | PRN

## 2019-12-30 MED ORDER — ACETAMINOPHEN 500 MG PO TABS
1000.0000 mg | ORAL_TABLET | Freq: Once | ORAL | Status: AC
  Administered 2019-12-30: 1000 mg via ORAL
  Filled 2019-12-30: qty 2

## 2019-12-30 MED ORDER — CEFAZOLIN SODIUM-DEXTROSE 2-4 GM/100ML-% IV SOLN
2.0000 g | Freq: Four times a day (QID) | INTRAVENOUS | Status: AC
  Administered 2019-12-30 (×2): 2 g via INTRAVENOUS
  Filled 2019-12-30 (×2): qty 100

## 2019-12-30 MED ORDER — LACTATED RINGERS IV SOLN: INTRAVENOUS | Status: DC

## 2019-12-30 MED ORDER — METOCLOPRAMIDE HCL 5 MG PO TABS: 5.0000 mg | ORAL_TABLET | Freq: Three times a day (TID) | ORAL | Status: DC | PRN

## 2019-12-30 MED ORDER — SENNOSIDES-DOCUSATE SODIUM 8.6-50 MG PO TABS: 1.0000 | ORAL_TABLET | Freq: Every evening | ORAL | Status: DC | PRN

## 2019-12-30 MED ORDER — SODIUM CHLORIDE 0.9% FLUSH
INTRAVENOUS | Status: DC | PRN
  Administered 2019-12-30: 20 mL

## 2019-12-30 MED ORDER — FUROSEMIDE 20 MG PO TABS
20.0000 mg | ORAL_TABLET | ORAL | Status: DC
  Administered 2019-12-30: 20 mg via ORAL
  Filled 2019-12-30: qty 1

## 2019-12-30 MED ORDER — RALOXIFENE HCL 60 MG PO TABS
60.0000 mg | ORAL_TABLET | Freq: Every day | ORAL | Status: DC
  Administered 2019-12-30 – 2019-12-31 (×2): 60 mg via ORAL
  Filled 2019-12-30 (×2): qty 1

## 2019-12-30 MED ORDER — ALBUTEROL SULFATE HFA 108 (90 BASE) MCG/ACT IN AERS
2.0000 | INHALATION_SPRAY | RESPIRATORY_TRACT | Status: DC | PRN
  Administered 2019-12-30 – 2019-12-31 (×2): 2 via RESPIRATORY_TRACT
  Filled 2019-12-30: qty 6.7

## 2019-12-30 MED ORDER — DIAZEPAM 2 MG PO TABS
5.0000 mg | ORAL_TABLET | Freq: Three times a day (TID) | ORAL | Status: DC | PRN
  Administered 2019-12-31: 5 mg via ORAL
  Filled 2019-12-30: qty 3

## 2019-12-30 MED ORDER — DEXAMETHASONE SODIUM PHOSPHATE 10 MG/ML IJ SOLN
INTRAMUSCULAR | Status: AC
  Filled 2019-12-30: qty 1

## 2019-12-30 MED ORDER — ALUM & MAG HYDROXIDE-SIMETH 200-200-20 MG/5ML PO SUSP: 30.0000 mL | ORAL | Status: DC | PRN

## 2019-12-30 MED ORDER — ATORVASTATIN CALCIUM 20 MG PO TABS
20.0000 mg | ORAL_TABLET | Freq: Every evening | ORAL | Status: DC
  Administered 2019-12-30: 20 mg via ORAL
  Filled 2019-12-30: qty 1

## 2019-12-30 MED ORDER — ONDANSETRON HCL 4 MG/2ML IJ SOLN
INTRAMUSCULAR | Status: AC
  Filled 2019-12-30: qty 2

## 2019-12-30 MED ORDER — CEFAZOLIN SODIUM-DEXTROSE 2-4 GM/100ML-% IV SOLN
2.0000 g | INTRAVENOUS | Status: AC
  Administered 2019-12-30: 2 g via INTRAVENOUS
  Filled 2019-12-30: qty 100

## 2019-12-30 MED ORDER — CLONIDINE HCL (ANALGESIA) 100 MCG/ML EP SOLN
EPIDURAL | Status: DC | PRN
Start: 2019-12-30 — End: 2019-12-30
  Administered 2019-12-30: 100 ug

## 2019-12-30 MED ORDER — BISACODYL 5 MG PO TBEC: 5.0000 mg | DELAYED_RELEASE_TABLET | Freq: Every day | ORAL | Status: DC | PRN

## 2019-12-30 MED ORDER — FENTANYL CITRATE (PF) 100 MCG/2ML IJ SOLN
50.0000 ug | INTRAMUSCULAR | Status: AC
  Administered 2019-12-30: 50 ug via INTRAVENOUS

## 2019-12-30 MED ORDER — DEXAMETHASONE SODIUM PHOSPHATE 10 MG/ML IJ SOLN
INTRAMUSCULAR | Status: DC | PRN
  Administered 2019-12-30: 5 mg via INTRAVENOUS

## 2019-12-30 MED ORDER — LIDOCAINE HCL (CARDIAC) PF 100 MG/5ML IV SOSY
PREFILLED_SYRINGE | INTRAVENOUS | Status: DC | PRN
  Administered 2019-12-30: 60 mg via INTRAVENOUS

## 2019-12-30 MED ORDER — FENTANYL CITRATE (PF) 100 MCG/2ML IJ SOLN
INTRAMUSCULAR | Status: AC
  Filled 2019-12-30: qty 2

## 2019-12-30 MED ORDER — DOCUSATE SODIUM 100 MG PO CAPS
100.0000 mg | ORAL_CAPSULE | Freq: Two times a day (BID) | ORAL | Status: DC
  Administered 2019-12-30 – 2019-12-31 (×2): 100 mg via ORAL
  Filled 2019-12-30 (×2): qty 1

## 2019-12-30 MED ORDER — DEXAMETHASONE SODIUM PHOSPHATE 10 MG/ML IJ SOLN: 8.0000 mg | Freq: Once | INTRAMUSCULAR | Status: AC

## 2019-12-30 MED ORDER — GABAPENTIN 300 MG PO CAPS
300.0000 mg | ORAL_CAPSULE | Freq: Once | ORAL | Status: AC
  Administered 2019-12-30: 300 mg via ORAL
  Filled 2019-12-30: qty 1

## 2019-12-30 MED ORDER — FLUTICASONE FUROATE-VILANTEROL 100-25 MCG/INH IN AEPB
1.0000 | INHALATION_SPRAY | Freq: Every day | RESPIRATORY_TRACT | Status: DC
  Administered 2019-12-31: 1 via RESPIRATORY_TRACT
  Filled 2019-12-30: qty 28

## 2019-12-30 MED ORDER — MENTHOL 3 MG MT LOZG: 1.0000 | LOZENGE | OROMUCOSAL | Status: DC | PRN

## 2019-12-30 MED ORDER — MIDAZOLAM HCL 2 MG/2ML IJ SOLN: 1.0000 mg | INTRAMUSCULAR | Status: DC

## 2019-12-30 MED ORDER — ZOLPIDEM TARTRATE 5 MG PO TABS: 5.0000 mg | ORAL_TABLET | Freq: Every evening | ORAL | Status: DC | PRN

## 2019-12-30 MED ORDER — BUPIVACAINE LIPOSOME 1.3 % IJ SUSP
INTRAMUSCULAR | Status: DC | PRN
  Administered 2019-12-30: 20 mL

## 2019-12-30 MED ORDER — SERTRALINE HCL 100 MG PO TABS
100.0000 mg | ORAL_TABLET | Freq: Every day | ORAL | Status: DC
  Administered 2019-12-30 – 2019-12-31 (×2): 100 mg via ORAL
  Filled 2019-12-30 (×2): qty 1

## 2019-12-30 MED ORDER — OXYCODONE HCL 5 MG PO TABS
ORAL_TABLET | ORAL | Status: AC
  Filled 2019-12-30: qty 1

## 2019-12-30 MED ORDER — AMLODIPINE BESYLATE 5 MG PO TABS
5.0000 mg | ORAL_TABLET | Freq: Every day | ORAL | Status: DC
  Administered 2019-12-30 – 2019-12-31 (×2): 5 mg via ORAL
  Filled 2019-12-30 (×2): qty 1

## 2019-12-30 SURGICAL SUPPLY — 58 items
ARTISURF 10M PLY L 3-5CD KNEE (Knees) ×1 IMPLANT
BAG SPEC THK2 15X12 ZIP CLS (MISCELLANEOUS) ×1
BAG ZIPLOCK 12X15 (MISCELLANEOUS) ×2 IMPLANT
BLADE SAGITTAL 13X1.27X60 (BLADE) ×2 IMPLANT
BLADE SAW SGTL 83.5X18.5 (BLADE) ×2 IMPLANT
BLADE SURG 15 STRL LF DISP TIS (BLADE) ×1 IMPLANT
BLADE SURG 15 STRL SS (BLADE) ×2
BLADE SURG SZ10 CARB STEEL (BLADE) ×4 IMPLANT
BNDG ELASTIC 6X5.8 VLCR STR LF (GAUZE/BANDAGES/DRESSINGS) ×2 IMPLANT
BOWL SMART MIX CTS (DISPOSABLE) ×2 IMPLANT
BSPLAT TIB 5D D CMNT STM LT (Knees) ×1 IMPLANT
CEMENT BONE SIMPLEX SPEEDSET (Cement) ×4 IMPLANT
COMP FEM PERSONA STD SZ5 LT (Joint) ×2 IMPLANT
COMPONENT FEM PERSNA STD SZ5LT (Joint) IMPLANT
COVER SURGICAL LIGHT HANDLE (MISCELLANEOUS) ×2 IMPLANT
COVER WAND RF STERILE (DRAPES) IMPLANT
CUFF TOURN SGL QUICK 34 (TOURNIQUET CUFF) ×2
CUFF TRNQT CYL 34X4.125X (TOURNIQUET CUFF) ×1 IMPLANT
DECANTER SPIKE VIAL GLASS SM (MISCELLANEOUS) ×4 IMPLANT
DRAPE INCISE IOBAN 66X45 STRL (DRAPES) ×4 IMPLANT
DRAPE U-SHAPE 47X51 STRL (DRAPES) ×2 IMPLANT
DRSG AQUACEL AG ADV 3.5X10 (GAUZE/BANDAGES/DRESSINGS) ×2 IMPLANT
DURAPREP 26ML APPLICATOR (WOUND CARE) ×4 IMPLANT
ELECT REM PT RETURN 15FT ADLT (MISCELLANEOUS) ×2 IMPLANT
GLOVE BIOGEL M STRL SZ7.5 (GLOVE) ×2 IMPLANT
GLOVE BIOGEL PI IND STRL 7.5 (GLOVE) ×1 IMPLANT
GLOVE BIOGEL PI IND STRL 8.5 (GLOVE) ×2 IMPLANT
GLOVE BIOGEL PI INDICATOR 7.5 (GLOVE) ×1
GLOVE BIOGEL PI INDICATOR 8.5 (GLOVE) ×2
GLOVE SURG ORTHO 8.0 STRL STRW (GLOVE) ×6 IMPLANT
GOWN STRL REUS W/ TWL XL LVL3 (GOWN DISPOSABLE) ×2 IMPLANT
GOWN STRL REUS W/TWL XL LVL3 (GOWN DISPOSABLE) ×4
HANDPIECE INTERPULSE COAX TIP (DISPOSABLE) ×2
HOLDER FOLEY CATH W/STRAP (MISCELLANEOUS) ×2 IMPLANT
HOOD PEEL AWAY FLYTE STAYCOOL (MISCELLANEOUS) ×6 IMPLANT
KIT TURNOVER KIT A (KITS) IMPLANT
MANIFOLD NEPTUNE II (INSTRUMENTS) ×2 IMPLANT
NEEDLE HYPO 22GX1.5 SAFETY (NEEDLE) ×2 IMPLANT
NS IRRIG 1000ML POUR BTL (IV SOLUTION) ×2 IMPLANT
PACK TOTAL KNEE CUSTOM (KITS) ×2 IMPLANT
PENCIL SMOKE EVACUATOR (MISCELLANEOUS) IMPLANT
PROTECTOR NERVE ULNAR (MISCELLANEOUS) ×2 IMPLANT
SET HNDPC FAN SPRY TIP SCT (DISPOSABLE) ×1 IMPLANT
STEM POLY PAT PLY 32M KNEE (Knees) ×1 IMPLANT
STEM TIBIA 5 DEG SZ D L KNEE (Knees) IMPLANT
STRIP CLOSURE SKIN 1/2X4 (GAUZE/BANDAGES/DRESSINGS) ×3 IMPLANT
SUT BONE WAX W31G (SUTURE) ×2 IMPLANT
SUT MNCRL AB 3-0 PS2 18 (SUTURE) ×2 IMPLANT
SUT STRATAFIX 0 PDS 27 VIOLET (SUTURE) ×2
SUT STRATAFIX PDS+ 0 24IN (SUTURE) ×2 IMPLANT
SUT VIC AB 1 CT1 36 (SUTURE) ×2 IMPLANT
SUTURE STRATFX 0 PDS 27 VIOLET (SUTURE) ×1 IMPLANT
SYR CONTROL 10ML LL (SYRINGE) ×4 IMPLANT
TIBIA STEM 5 DEG SZ D L KNEE (Knees) ×2 IMPLANT
TRAY FOLEY MTR SLVR 16FR STAT (SET/KITS/TRAYS/PACK) ×2 IMPLANT
WATER STERILE IRR 1000ML POUR (IV SOLUTION) ×4 IMPLANT
WRAP KNEE MAXI GEL POST OP (GAUZE/BANDAGES/DRESSINGS) ×2 IMPLANT
YANKAUER SUCT BULB TIP 10FT TU (MISCELLANEOUS) ×2 IMPLANT

## 2019-12-30 NOTE — Anesthesia Postprocedure Evaluation (Signed)
Anesthesia Post Note  Patient: [name]  Procedure(s) Performed: TOTAL KNEE ARTHROPLASTY (Left Knee)     Patient location during evaluation: Nursing Unit Anesthesia Type: Spinal Level of consciousness: oriented and awake and alert Pain management: pain level controlled Vital Signs Assessment: post-procedure vital signs reviewed and stable Respiratory status: spontaneous breathing and respiratory function stable Cardiovascular status: blood pressure returned to baseline and stable Postop Assessment: no headache, no backache, no apparent nausea or vomiting and patient able to bend at knees Anesthetic complications: no    Last Vitals:  Vitals:   12/30/19 1115 12/30/19 1130  BP: (!) 162/103 (!) 156/75  Pulse: (!) 56 (!) 56  Resp: 15 13  Temp:  (!) 36.4 C  SpO2: 98% 94%    Last Pain:  Vitals:   12/30/19 1146  TempSrc:   PainSc: 4                  [place]

## 2019-12-30 NOTE — Progress Notes (Signed)
Orthopedic Tech Progress Note Patient Details:  [name] [birth year] [id number]  CPM Left Knee CPM Left Knee: On Left Knee Flexion (Degrees): 90 Left Knee Extension (Degrees): 0 Additional Comments: Trapeze bar and foot roll  Post Interventions Patient Tolerated: Well Instructions Provided: Care of device  [name] 12/30/2019, 11:09 AM

## 2019-12-30 NOTE — Op Note (Signed)
TOTAL KNEE REPLACEMENT OPERATIVE NOTE:  12/30/2019  9:45 AM  PATIENT:  [name]  79 y.o. female  PRE-OPERATIVE DIAGNOSIS:  Osteoarthritis left knee  POST-OPERATIVE DIAGNOSIS:  * No post-op diagnosis entered *  PROCEDURE:  Procedure(s): TOTAL KNEE ARTHROPLASTY  SURGEON:  Surgeon(s): Vickey Huger, MD  PHYSICIAN ASSISTANT:  Nehemiah Massed, PA-C  ANESTHESIA:   spinal  SPECIMEN: None  COUNTS:  Correct  TOURNIQUET:   Total Tourniquet Time Documented: Thigh (Left) - 43 minutes Total: Thigh (Left) - 43 minutes   DICTATION:  Indication for procedure:    The patient is a 79 y.o. female who has failed conservative treatment for Osteoarthritis left knee.  Informed consent was obtained prior to anesthesia. The risks versus benefits of the operation were explain and in a way the patient can, and did, understand.    Description of procedure:     The patient was taken to the operating room and placed under anesthesia.  The patient was positioned in the usual fashion taking care that all body parts were adequately padded and/or protected.  A tourniquet was applied and the leg prepped and draped in the usual sterile fashion.  The extremity was exsanguinated with the esmarch and tourniquet inflated to 350 mmHg.  Pre-operative range of motion was normal.    A midline incision approximately 6-7 inches long was made with a #10 blade.  A new blade was used to make a parapatellar arthrotomy going 2-3 cm into the quadriceps tendon, over the patella, and alongside the medial aspect of the patellar tendon.  A synovectomy was then performed with the #10 blade and forceps. I then elevated the deep MCL off the medial tibial metaphysis subperiosteally around to the semimembranosus attachment.    I everted the patella and used calipers to measure patellar thickness.  I used the reamer to ream down to appropriate thickness to recreate the native thickness.  I then removed excess bone with the rongeur  and sagittal saw.  I used the appropriately sized template and drilled the three lug holes.  I then put the trial in place and measured the thickness with the calipers to ensure recreation of the native thickness.  The trial was then removed and the patella subluxed and the knee brought into flexion.  A homan retractor was place to retract and protect the patella and lateral structures.  A Z-retractor was place medially to protect the medial structures.  The extra-medullary alignment system was used to make cut the tibial articular surface perpendicular to the anamotic axis of the tibia and in 3 degrees of posterior slope.  The cut surface and alignment jig was removed.  I then used the intramedullary alignment guide to make a  valgus cut on the distal femur.  I then marked out the epicondylar axis on the distal femur.    I then used the anterior referencing sizer and measured the femur to be a size 5.  The 4-In-1 cutting block was screwed into place in external rotation matching the posterior condylar angle, making our cuts perpendicular to the epicondylar axis.  Anterior, posterior and chamfer cuts were made with the sagittal saw.  The cutting block and cut pieces were removed.  A lamina spreader was placed in 90 degrees of flexion.  The ACL, PCL, menisci, and posterior condylar osteophytes were removed.  A 10 mm spacer blocked was found to offer good flexion and extension gap balance after minimal in degree releasing.   The scoop retractor was then placed  and the femoral finishing block was pinned in place.  The small sagittal saw was used as well as the lug drill to finish the femur.  The block and cut surfaces were removed and the medullary canal hole filled with autograft bone from the cut pieces.  The tibia was delivered forward in deep flexion and external rotation.  A size D tray was selected and pinned into place centered on the medial 1/3 of the tibial tubercle.  The reamer and keel was used to  prepare the tibia through the tray.    I then trialed with the size 5 femur, size D tibia, a 10 mm insert and the 32 patella.  I had excellent flexion/extension gap balance, excellent patella tracking.  Flexion was full and beyond 120 degrees; extension was zero.  These components were chosen and the staff opened them to me on the back table while the knee was lavaged copiously and the cement mixed.  The soft tissue was infiltrated with 60cc of exparel 1.3% through a 21 gauge needle.  I cemented in the components and removed all excess cement.  The polyethylene tibial component was snapped into place and the knee placed in extension while cement was hardening.  The capsule was infilltrated with a 60cc exparel/marcaine/saline mixture.   Once the cement was hard, the tourniquet was let down.  Hemostasis was obtained.  The arthrotomy was closed using a #1 stratofix running suture.  The deep soft tissues were closed with #0 vicryls and the subcuticular layer closed with #2-0 vicryl.  The skin was reapproximated and closed with 3.0 Monocryl.  The wound was covered with steristrips, aquacel dressing, and a TED stocking.   The patient was then awakened, extubated, and taken to the recovery room in stable condition.  BLOOD LOSS:  321YY COMPLICATIONS:  None.  PLAN OF CARE: Admit for overnight observation  PATIENT DISPOSITION:  PACU - hemodynamically stable.     Please fax a copy of this op note to my office at [phone] (please only include page 1 and 2 of the Case Information op note)

## 2019-12-30 NOTE — Evaluation (Signed)
Physical Therapy Evaluation Patient Details Name: [name] MRN: [id number] DOB: 1940/12/18 Today's Date: 12/30/2019   History of Present Illness  Patient is 79 y.o. female s/p Lt TKA on 12/30/19 with PMH significant for osteoporosis, OA, HTN, HLD, GERD, CAD, COPD, uterine cancer, lung cancer, asthma, anxitey, Rt TKA in 2020, Lt hip surgery.    Clinical Impression  [name] is a 79 y.o. female POD 0 s/p Lt TKA. Patient reports independence with rollator/SPC for mobility at baseline. Patient is now limited by functional impairments (see PT problem list below) and requires min assist for transfers and gait with RW. Patient was able to ambulate ~40 feet with RW and min assist. Patient instructed in exercise to facilitate ROM and circulation. Patient will benefit from continued skilled PT interventions to address impairments and progress towards PLOF. Acute PT will follow to progress mobility and stair training in preparation for safe discharge home.     Follow Up Recommendations Follow surgeon's recommendation for DC plan and follow-up therapies;Home health PT    Equipment Recommendations  None recommended by PT    Recommendations for Other Services       Precautions / Restrictions Precautions Precautions: Fall Restrictions Weight Bearing Restrictions: No      Mobility  Bed Mobility Overal bed mobility: Needs Assistance Bed Mobility: Supine to Sit     Supine to sit: Min assist;HOB elevated     General bed mobility comments: cues for sequencing LE mobility and use of bed rail to move towards EOB. assist required to raise trunk.  Transfers Overall transfer level: Needs assistance Equipment used: Rolling walker (2 wheeled) Transfers: Sit to/from Stand Sit to Stand: Min assist         General transfer comment: cues for safe hand placement/technique with RW for power up, assist to complete and steady with rising.   Ambulation/Gait Ambulation/Gait assistance: Min  assist Gait Distance (Feet): 40 Feet Assistive device: Rolling walker (2 wheeled) Gait Pattern/deviations: Step-to pattern;Decreased stride length;Decreased stance time - left;Decreased weight shift to left Gait velocity: decreased   General Gait Details: cues for safety with step pattern and safe proximity to RW. assist required throughout for safe walker positioning/management.  Stairs            Wheelchair Mobility    Modified Rankin (Stroke Patients Only)       Balance Overall balance assessment: Needs assistance Sitting-balance support: Feet supported Sitting balance-Leahy Scale: Good     Standing balance support: Bilateral upper extremity supported;During functional activity Standing balance-Leahy Scale: Poor            Pertinent Vitals/Pain Pain Assessment: 0-10 Pain Score: 7  Pain Location: Lt knee Pain Descriptors / Indicators: Aching;Discomfort;Grimacing;Guarding;Moaning Pain Intervention(s): Ice applied;Limited activity within patient's tolerance;Monitored during session;Repositioned;Patient requesting pain meds-RN notified    Home Living Family/patient expects to be discharged to:: Private residence Living Arrangements: Children Available Help at Discharge: Family;Available 24 hours/day Type of Home: House Home Access: Stairs to enter Entrance Stairs-Rails: Right Entrance Stairs-Number of Steps: 3-4 at front porch Home Layout: One level Home Equipment: Bedside commode;Walker - 2 wheels;Cane - single point;Wheelchair - manual;Grab bars - tub/shower;Walker - 4 wheels      Prior Function Level of Independence: Independent with assistive device(s)         Comments: pt reports she has been using RW or SPC out on porch or out of home.     Hand Dominance   Dominant Hand: Right    Extremity/Trunk Assessment  Upper Extremity Assessment Upper Extremity Assessment: Overall WFL for tasks assessed    Lower Extremity Assessment Lower Extremity  Assessment: Overall WFL for tasks assessed;LLE deficits/detail LLE Deficits / Details: pt with good quad activation, able to complete SLR with no extensor lag LLE: Unable to fully assess due to pain LLE Sensation: WNL LLE Coordination: WNL    Cervical / Trunk Assessment Cervical / Trunk Assessment: Normal  Communication   Communication: No difficulties  Cognition Arousal/Alertness: Awake/alert Behavior During Therapy: WFL for tasks assessed/performed Overall Cognitive Status: Within Functional Limits for tasks assessed           General Comments      Exercises Total Joint Exercises Ankle Circles/Pumps: AROM;Both;20 reps;Seated Quad Sets: AROM;Left;5 reps;Seated Heel Slides: AROM;Left;5 reps;Seated   Assessment/Plan    PT Assessment Patient needs continued PT services  PT Problem List Decreased range of motion;Decreased strength;Decreased activity tolerance;Decreased balance;Decreased mobility;Decreased knowledge of use of DME       PT Treatment Interventions DME instruction;Gait training;Functional mobility training;Stair training;Therapeutic activities;Therapeutic exercise;Balance training;Patient/family education    PT Goals (Current goals can be found in the Care Plan section)  Acute Rehab PT Goals Patient Stated Goal: to get independent again PT Goal Formulation: With patient Time For Goal Achievement: 01/06/20 Potential to Achieve Goals: Good    Frequency 7X/week    AM-PAC PT "6 Clicks" Mobility  Outcome Measure Help needed turning from your back to your side while in a flat bed without using bedrails?: A Little Help needed moving from lying on your back to sitting on the side of a flat bed without using bedrails?: A Little Help needed moving to and from a bed to a chair (including a wheelchair)?: A Little Help needed standing up from a chair using your arms (e.g., wheelchair or bedside chair)?: A Little Help needed to walk in hospital room?: A Little Help  needed climbing 3-5 steps with a railing? : A Lot 6 Click Score: 17    End of Session Equipment Utilized During Treatment: Gait belt Activity Tolerance: Patient tolerated treatment well Patient left: in chair;with call bell/phone within reach;with chair alarm set Nurse Communication: Mobility status;Patient requests pain meds PT Visit Diagnosis: Muscle weakness (generalized) (M62.81);Difficulty in walking, not elsewhere classified (R26.2)    Time: 5462-7035 PT Time Calculation (min) (ACUTE ONLY): 19 min   Charges:   PT Evaluation $PT Eval Low Complexity: 1 Low        Verner Mould, DPT Physical Therapist with [facility] [phone]  12/30/2019 2:32 PM

## 2019-12-30 NOTE — Anesthesia Procedure Notes (Addendum)
Anesthesia Regional Block: Adductor canal block   Pre-Anesthetic Checklist: ,, timeout performed, Correct Patient, Correct Site, Correct Laterality, Correct Procedure, Correct Position, site marked, Risks and benefits discussed,  Surgical consent,  Pre-op evaluation,  At surgeon's request and post-op pain management  Laterality: Lower and Left  Prep: chloraprep       Needles:  Injection technique: Single-shot  Needle Type: Echogenic Needle     Needle Length: 9cm  Needle Gauge: 22     Additional Needles:   Procedures:,,,, ultrasound used (permanent image in chart),,,,  Narrative:  Start time: 12/30/2019 7:58 AM End time: 12/30/2019 8:06 AM Injection made incrementally with aspirations every 5 mL.  Performed by: Personally  Anesthesiologist: Barnet Glasgow, MD  Additional Notes: Block assessed prior to surgery. Pt tolerated procedure well.

## 2019-12-30 NOTE — Transfer of Care (Signed)
Immediate Anesthesia Transfer of Care Note  Patient: [name]  Procedure(s) Performed: TOTAL KNEE ARTHROPLASTY (Left Knee)  Patient Location: PACU  Anesthesia Type:Spinal  Level of Consciousness: awake, alert , oriented, drowsy and patient cooperative  Airway & Oxygen Therapy: Patient Spontanous Breathing and Patient connected to face mask oxygen  Post-op Assessment: Report given to RN and Post -op Vital signs reviewed and stable  Post vital signs: Reviewed and stable  Last Vitals:  Vitals Value Taken Time  BP 113/68 12/30/19 1034  Temp    Pulse 58 12/30/19 1036  Resp 14 12/30/19 1036  SpO2 99 % 12/30/19 1036  Vitals shown include unvalidated device data.  Last Pain:  Vitals:   12/30/19 0729  TempSrc:   PainSc: 5          Complications: No apparent anesthesia complications

## 2019-12-30 NOTE — Progress Notes (Signed)
Orthopedic Tech Progress Note Patient Details:  [name] 02-02-1941 [id number]  CPM Left Knee CPM Left Knee: Off Left Knee Flexion (Degrees): 90 Left Knee Extension (Degrees): 0 Additional Comments: Trapeze bar and foot roll  Post Interventions Patient Tolerated: Well Instructions Provided: Care of device  [name] 12/30/2019, 1:51 PM

## 2019-12-30 NOTE — H&P (Signed)
[name] MRN:  [id number] DOB/SEX:  03/27/1941/female  CHIEF COMPLAINT:  Painful left Knee  HISTORY: Patient is a 79 y.o. female presented with a history of pain in the left knee. Onset of symptoms was gradual starting a few years ago with gradually worsening course since that time. Patient has been treated conservatively with over-the-counter NSAIDs and activity modification. Patient currently rates pain in the knee at 10 out of 10 with activity. There is pain at night.  PAST MEDICAL HISTORY: Patient Active Problem List   Diagnosis Date Noted  . Cough   . Acute metabolic encephalopathy 76/19/5093  . S/P total knee replacement 05/27/2019  . 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 Bilateral renal cysts 05/21/2018  . Hiatal hernia 05/21/2018  . 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 Thoracic radiculitis (Bilateral) (L>R) 05/03/2018  . Chronic thoracic back pain (Left) 05/03/2018  . Injury of peripheral nerves of thorax, sequela 03/22/2018  . Chest pain 03/21/2018  . Rib pain (Left) 03/21/2018  . Chronic rib pain (Right) 03/20/2018  . DDD (degenerative disc disease), lumbar 03/20/2018  . Chronic fracture of pubic ramus, sequela (Right) 03/20/2018  . History of femoral neck fracture, sequela (Left) 03/20/2018  . Lumbar facet arthropathy (Bilateral) 03/20/2018  . Lumbar facet syndrome (Bilateral) 03/20/2018  . Osteoarthritis of facet joint of lumbar spine 03/20/2018  . Spondylosis without myelopathy or radiculopathy, lumbosacral region 03/20/2018  . Other specified dorsopathies, sacral and sacrococcygeal region 03/20/2018  . Chronic pain syndrome 02/26/2018  . Cancer related pain 02/26/2018  . 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 Chronic low back pain (Secondary Area of Pain) (Bilateral) 02/26/2018  . 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 Chronic lower extremity pain [facility] Area of Pain)  (Bilateral) (R>L) 02/26/2018  . Chronic hip pain (Fourth Area of Pain) (Bilateral) (R>L) 02/26/2018  . Neurogenic pain 02/26/2018  . Pharmacologic therapy 02/26/2018  . Problems influencing health status 02/26/2018  . Long term prescription benzodiazepine use 02/26/2018  . Long term prescription opiate use 02/26/2018  . Acute systolic heart failure ([place])   . Non-ST elevation (NSTEMI) myocardial infarction ([place])   . History of uterine cancer 12/08/2017  . Generalized anxiety disorder 12/08/2017  . GERD (gastroesophageal reflux disease) 12/08/2017  . Substernal chest pain 12/08/2017  . Elevated brain natriuretic peptide (BNP) level 12/08/2017  . Elevated troponin 12/08/2017  . Embedded tick of upper back excluding scapular region 12/08/2017  . Chronic sacroiliac joint pain 06/21/2017  . Disorder of skeletal system 06/21/2017  . Other long term (current) drug therapy 06/21/2017  . Other specified health status 06/21/2017  . Chronic chest wall pain (Primary Area of Pain) 06/21/2017  . Chronic post-thoracotomy pain 03/03/2017  . 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 Adenocarcinoma of right lung, stage 1 ([place]) 07/28/2016  . S/P lobectomy of lung 05/09/2016  . DVT (deep venous thrombosis) ([place]) 04/26/2016  . Heart murmur   . Acute exacerbation of chronic obstructive pulmonary disease (COPD) ([place]) 08/29/2015  . Essential hypertension 08/29/2015  . Hypercholesterolemia 08/29/2015  . CKD (chronic kidney disease), stage III ([place]) 08/29/2015  . COPD (chronic obstructive pulmonary disease) ([name]) 04/27/2015  . Acute on chronic respiratory failure with hypoxia ([place]) 04/27/2015  . Infected sebaceous cyst 08/16/2011   Past Medical History:  Diagnosis Date  . Adenocarcinoma of right lung, stage 1 ([place]) 07/28/2016  . Anxiety   . Asthma   . Cancer ([place])    uterine  .  COPD (chronic obstructive pulmonary disease) ([place])   . Coronary artery disease    mild nonobstructive by 2019  cath  . Cyst    left side of neck  . Depression   . GERD (gastroesophageal reflux disease)   . Heart murmur   . Hyperlipidemia   . Hypertension   . Low back pain   . Osteoarthritis   . Osteoporosis   . Takotsubo cardiomyopathy    EF recovered to 50-55% by 01/23/18 echo   Past Surgical History:  Procedure Laterality Date  . ANKLE SURGERY Right    horse accident  . APPENDECTOMY    . BACK SURGERY    . CHOLECYSTECTOMY    . EYE SURGERY     lense implant  . FRACTURE SURGERY    . HIP SURGERY  01/2010   Left Hip   . INNER EAR SURGERY Bilateral 1970   related to severe ear infections  . KNEE SURGERY     Right knee  . LEFT HEART CATH AND CORONARY ANGIOGRAPHY N/A 12/11/2017   Procedure: LEFT HEART CATH AND CORONARY ANGIOGRAPHY;  Surgeon: Jettie Booze, MD;  Location: [facility];  Service: Cardiovascular;  Laterality: N/A;  . LOBECTOMY Right 05/09/2016   Procedure: RIGHT UPPER LOBECTOMY;  Surgeon: [name], MD;  Location: [facility];  Service: Thoracic;  Laterality: Right;  . MASS EXCISION  08/25/2011   Procedure: EXCISION MASS;  Surgeon: Harl Bowie, MD;  Location: [facility];  Service: General;  Laterality: N/A;  excision left neck mass  . TOTAL ABDOMINAL HYSTERECTOMY    . TOTAL KNEE ARTHROPLASTY Right 05/27/2019   Procedure: TOTAL KNEE ARTHROPLASTY;  Surgeon: Vickey Huger, MD;  Location: [facility];  Service: Orthopedics;  Laterality: Right;  75 mins needed for length of case  . VIDEO ASSISTED THORACOSCOPY (VATS)/WEDGE RESECTION Right 05/09/2016   Procedure: VIDEO ASSISTED THORACOSCOPY (VATS)/WEDGE RESECTION;  Surgeon: [name], MD;  Location: [facility];  Service: Thoracic;  Laterality: Right;     MEDICATIONS:   Medications Prior to Admission  Medication Sig Dispense Refill Last Dose  . acetaminophen (TYLENOL) 500 MG tablet Take 2 tablets (1,000 mg total) by mouth every 6 (six) hours. 30 tablet 0   . albuterol (PROVENTIL HFA;VENTOLIN  HFA) 108 (90 BASE) MCG/ACT inhaler Inhale 2 puffs into the lungs every 6 (six) hours as needed. (Patient taking differently: Inhale 2 puffs into the lungs every 4 (four) hours as needed for wheezing or shortness of breath. ) 18 g 1 Past Month at Unknown time  . albuterol (PROVENTIL) (2.5 MG/3ML) 0.083% nebulizer solution Take 3 mLs (2.5 mg total) by nebulization every 2 (two) hours as needed for wheezing or shortness of breath. 75 mL 12 Past Month at Unknown time  . aluminum-magnesium hydroxide-simethicone (MAALOX) 353-299-24 MG/5ML SUSP Take 30 mLs by mouth as needed (indigestion).   Past Month at Unknown time  . amLODipine (NORVASC) 5 MG tablet Take 5 mg by mouth daily.     Marland Kitchen aspirin EC 325 MG EC tablet Take 1 tablet (325 mg total) by mouth 2 (two) times daily. (Patient taking differently: Take 325 mg by mouth daily. ) 30 tablet 0   . atorvastatin (LIPITOR) 20 MG tablet Take 20 mg by mouth every evening.      . Calcium Carb-Cholecalciferol (CALCIUM-VITAMIN D) 600-400 MG-UNIT TABS Take 1 tablet by mouth daily.     . diazepam (VALIUM) 5 MG tablet Take 0.5 tablets (2.5 mg total) by mouth every  8 (eight) hours as needed for anxiety (for nerves). Must last 30 days. Filled 11-28-17 (Patient taking differently: Take 5 mg by mouth every 8 (eight) hours as needed for anxiety (for nerves). Must last 30 days. Filled 11-28-17) 30 tablet 0   . ferrous sulfate 325 (65 FE) MG tablet Take 325 mg by mouth daily with breakfast.     . furosemide (LASIX) 20 MG tablet Take 20 mg by mouth 2 (two) times a week. Monday and Wednesday     . gabapentin (NEURONTIN) 400 MG capsule Take 1 capsule (400 mg total) by mouth 4 (four) times daily. 120 capsule 2   . lisinopril (PRINIVIL,ZESTRIL) 10 MG tablet Take 1 tablet (10 mg total) by mouth every morning. 30 tablet 0   . meloxicam (MOBIC) 7.5 MG tablet Take 7.5-15 mg by mouth daily as needed for pain.     Marland Kitchen metoCLOPramide (REGLAN) 5 MG tablet Take 5 mg by mouth 2 (two) times daily.       . metoprolol tartrate (LOPRESSOR) 25 MG tablet Take 0.5 tablets (12.5 mg total) by mouth 2 (two) times daily. (Patient taking differently: Take 12.5 mg by mouth every morning. ) 60 tablet 0   . mirtazapine (REMERON) 7.5 MG tablet Take 7.5 mg by mouth at bedtime.     Marland Kitchen oxybutynin (DITROPAN-XL) 5 MG 24 hr tablet Take 5 mg by mouth at bedtime.     . pantoprazole (PROTONIX) 40 MG tablet Take 1 tablet (40 mg total) by mouth daily at 6 (six) AM. 30 tablet 0   . raloxifene (EVISTA) 60 MG tablet Take 60 mg by mouth daily.     . sertraline (ZOLOFT) 100 MG tablet Take 100 mg by mouth in the morning and at bedtime.      Marland Kitchen tiZANidine (ZANAFLEX) 2 MG tablet Take 1 tablet (2 mg total) by mouth every 6 (six) hours as needed. (Patient taking differently: Take 2 mg by mouth every 6 (six) hours as needed for muscle spasms. ) 50 tablet 0   . traMADol (ULTRAM) 50 MG tablet Take 1 tablet (50 mg total) by mouth 3 (three) times daily as needed. 30 tablet 0   . TRELEGY ELLIPTA 100-62.5-25 MCG/INH AEPB Inhale 1 puff into the lungs daily.     . ondansetron (ZOFRAN ODT) 4 MG disintegrating tablet 4mg  ODT q4 hours prn nausea/vomit (Patient not taking: Reported on 12/17/2019) 20 tablet 0 Not Taking    ALLERGIES:   Allergies  Allergen Reactions  . Boniva [Ibandronate Sodium] Other (See Comments)    UNSPECIFIED REACTION   . Lyrica [Pregabalin] Nausea And Vomiting    Dizziness and blurred vision as well  . Levofloxacin     REVIEW OF SYSTEMS:  A comprehensive review of systems was negative except for: Musculoskeletal: positive for arthralgias and bone pain   FAMILY HISTORY:   Family History  Problem Relation Age of Onset  . Heart disease Mother   . Heart disease Father   . Cancer Sister        #1, breast  . Heart disease Brother        had CABG    SOCIAL HISTORY:   Social History   Tobacco Use  . Smoking status: Former Smoker    Packs/day: 2.00    Years: 51.00    Pack years: 102.00    Types:  Cigarettes    Quit date: 05/04/2012    Years since quitting: 7.6  . Smokeless tobacco: Never Used  Substance Use Topics  .  Alcohol use: No    Alcohol/week: 0.0 standard drinks     EXAMINATION:  Vital signs in last 24 hours: Temp:  [98.6 F (37 C)] 98.6 F (37 C) (06/07 2671) Pulse Rate:  [76] 76 (06/07 0652) Resp:  [16] 16 (06/07 0652) BP: (188)/(90) 188/90 (06/07 0652) SpO2:  [98 %] 98 % (06/07 0652)  BP (!) 188/90   Pulse 76   Temp 98.6 F (37 C) (Oral)   Resp 16   SpO2 98%   General Appearance:    Alert, cooperative, no distress, appears stated age  Head:    Normocephalic, without obvious abnormality, atraumatic  Eyes:    PERRL, conjunctiva/corneas clear, EOM's intact, fundi    benign, both eyes  Ears:    Normal TM's and external ear canals, both ears  Nose:   Nares normal, septum midline, mucosa normal, no drainage    or sinus tenderness  Throat:   Lips, mucosa, and tongue normal; teeth and gums normal  Neck:   Supple, symmetrical, trachea midline, no adenopathy;    thyroid:  no enlargement/tenderness/nodules; no carotid   bruit or JVD  Back:     Symmetric, no curvature, ROM normal, no CVA tenderness  Lungs:     Clear to auscultation bilaterally, respirations unlabored  Chest Wall:    No tenderness or deformity   Heart:    Regular rate and rhythm, S1 and S2 normal, no murmur, rub   or gallop  Breast Exam:    No tenderness, masses, or nipple abnormality  Abdomen:     Soft, non-tender, bowel sounds active all four quadrants,    no masses, no organomegaly  Genitalia:    Normal female without lesion, discharge or tenderness  Rectal:    Normal tone,  no masses or tenderness;   guaiac negative stool  Extremities:   Extremities normal, atraumatic, no cyanosis or edema  Pulses:   2+ and symmetric all extremities  Skin:   Skin color, texture, turgor normal, no rashes or lesions  Lymph nodes:   Cervical, supraclavicular, and axillary nodes normal  Neurologic:   CNII-XII  intact, normal strength, sensation and reflexes    throughout    Musculoskeletal:  ROM 0-120, Ligaments intact,  Imaging Review Plain radiographs demonstrate severe degenerative joint disease of the left knee. The overall alignment is neutral. The bone quality appears to be good for age and reported activity level.  Assessment/Plan: Primary osteoarthritis, left knee   The patient history, physical examination and imaging studies are consistent with advanced degenerative joint disease of the left knee. The patient has failed conservative treatment.  The clearance notes were reviewed.  After discussion with the patient it was felt that Total Knee Replacement was indicated. The procedure,  risks, and benefits of total knee arthroplasty were presented and reviewed. The risks including but not limited to aseptic loosening, infection, blood clots, vascular injury, stiffness, patella tracking problems complications among others were discussed. The patient acknowledged the explanation, agreed to proceed with the plan.  Preoperative templating of the joint replacement has been completed, documented, and submitted to the Operating Room personnel in order to optimize intra-operative equipment management.    Patient's anticipated LOS is less than 2 midnights, meeting these requirements: - Lives within 1 hour of care - Has a competent adult at home to recover with post-op recover - NO history of  - Chronic pain requiring opiods  - Diabetes  - Coronary Artery Disease  - Heart failure  - Heart attack  -  Stroke  - DVT/VTE  - Cardiac arrhythmia  - Respiratory Failure/COPD  - Renal failure  - Anemia  - Advanced Liver disease       Donia Ast 12/30/2019, 7:00 AM

## 2019-12-30 NOTE — Progress Notes (Signed)
Assisted Dr. Andres Shad with left adductor canal block. Side rails up, monitors on throughout procedure. See vital signs in flow sheet. Tolerated Procedure well.

## 2019-12-30 NOTE — Anesthesia Procedure Notes (Signed)
Spinal  Patient location during procedure: OR Start time: 12/30/2019 8:25 AM End time: 12/30/2019 8:28 AM Staffing Performed: resident/CRNA  Anesthesiologist: Barnet Glasgow, MD Resident/CRNA: Raenette Rover, CRNA Preanesthetic Checklist Completed: patient identified, IV checked, site marked, risks and benefits discussed, surgical consent, monitors and equipment checked, pre-op evaluation and timeout performed Spinal Block Patient position: sitting Prep: DuraPrep Patient monitoring: heart rate, continuous pulse ox and blood pressure Approach: midline Location: L3-4 Injection technique: single-shot Needle Needle type: Pencan  Needle gauge: 24 G Assessment Sensory level: T6

## 2019-12-31 ENCOUNTER — Encounter: Payer: Self-pay | Admitting: *Deleted

## 2019-12-31 DIAGNOSIS — M1712 Unilateral primary osteoarthritis, left knee: Secondary | ICD-10-CM | POA: Diagnosis not present

## 2019-12-31 LAB — CBC
HCT: 26.8 % — ABNORMAL LOW (ref 36.0–46.0)
Hemoglobin: 8 g/dL — ABNORMAL LOW (ref 12.0–15.0)
MCH: 26.1 pg (ref 26.0–34.0)
MCHC: 29.9 g/dL — ABNORMAL LOW (ref 30.0–36.0)
MCV: 87.3 fL (ref 80.0–100.0)
Platelets: 227 10*3/uL (ref 150–400)
RBC: 3.07 MIL/uL — ABNORMAL LOW (ref 3.87–5.11)
RDW: 16.6 % — ABNORMAL HIGH (ref 11.5–15.5)
WBC: 8.7 10*3/uL (ref 4.0–10.5)
nRBC: 0 % (ref 0.0–0.2)

## 2019-12-31 LAB — BASIC METABOLIC PANEL
Anion gap: 9 (ref 5–15)
BUN: 17 mg/dL (ref 8–23)
CO2: 23 mmol/L (ref 22–32)
Calcium: 7.9 mg/dL — ABNORMAL LOW (ref 8.9–10.3)
Chloride: 106 mmol/L (ref 98–111)
Creatinine, Ser: 1.31 mg/dL — ABNORMAL HIGH (ref 0.44–1.00)
GFR calc Af Amer: 46 mL/min — ABNORMAL LOW (ref >60)
GFR calc non Af Amer: 39 mL/min — ABNORMAL LOW (ref >60)
Glucose, Bld: 111 mg/dL — ABNORMAL HIGH (ref 70–99)
Potassium: 4.2 mmol/L (ref 3.5–5.1)
Sodium: 138 mmol/L (ref 135–145)

## 2019-12-31 MED ORDER — ASPIRIN 325 MG PO TBEC: 325.0000 mg | DELAYED_RELEASE_TABLET | Freq: Two times a day (BID) | ORAL | 0 refills | Status: DC

## 2019-12-31 MED ORDER — OXYCODONE HCL 5 MG PO TABS: 5.0000 mg | ORAL_TABLET | Freq: Four times a day (QID) | ORAL | 0 refills | Status: DC | PRN

## 2019-12-31 NOTE — TOC Transition Note (Signed)
Transition of Care [facility]) - CM/SW Discharge Note   Patient Details  Name: [name] MRN: [id number] Date of Birth: 09/15/1940  [facility]) CM/SW Contact:  Lennart Pall, LCSW Phone Number: 12/31/2019, 10:10 AM   Clinical Narrative:   Pt with no DME needs.  [facility] to follow. NO TOC needs.    Final next level of care: [place] Barriers to Discharge: No Barriers Identified   Patient Goals and CMS Choice        Discharge Placement                       Discharge Plan and Services                DME Arranged: N/A DME Agency: NA       HH Arranged: PT(pre-arranged with ortho office) [place] Agency: [facility] (now [facility])        Social Determinants of Health (SDOH) Interventions     Readmission Risk Interventions No flowsheet data found.

## 2019-12-31 NOTE — Progress Notes (Signed)
SPORTS MEDICINE AND JOINT REPLACEMENT  [name], MD    Carlyon Shadow, PA-C [place], [place],   [id number]                             [phone]   PROGRESS NOTE  Subjective:  negative for Chest Pain  negative for Shortness of Breath  negative for Nausea/Vomiting   negative for Calf Pain  negative for Bowel Movement   Tolerating Diet: yes         Patient reports pain as 3 on 0-10 scale.    Objective: Vital signs in last 24 hours:    Patient Vitals for the past 24 hrs:  BP Temp Temp src Pulse Resp SpO2  12/31/19 0516 127/71 (!) 97.5 F (36.4 C) Oral (!) 57 17 98 %  12/31/19 0118 123/68 98 F (36.7 C) Axillary (!) 55 17 98 %  12/30/19 2106 111/63 97.8 F (36.6 C) Oral 64 16 97 %  12/30/19 1801 -- -- -- -- -- 97 %  12/30/19 1752 121/70 97.8 F (36.6 C) -- 62 18 97 %  12/30/19 1533 105/83 98.2 F (36.8 C) Oral 92 18 --  12/30/19 1440 (!) 150/78 -- -- 63 16 94 %  12/30/19 1328 (!) 143/82 98.4 F (36.9 C) Oral (!) 59 16 95 %  12/30/19 1231 (!) 138/95 97.6 F (36.4 C) Oral (!) 59 16 99 %  12/30/19 1200 (!) 172/89 (!) 97.5 F (36.4 C) -- (!) 58 12 97 %  12/30/19 1130 (!) 156/75 (!) 97.5 F (36.4 C) -- (!) 56 13 94 %  12/30/19 1115 (!) 162/103 -- -- (!) 56 15 98 %  12/30/19 1100 (!) 108/97 -- -- (!) 56 12 100 %  12/30/19 1045 134/63 -- -- (!) 55 13 100 %  12/30/19 1034 113/68 97.6 F (36.4 C) -- 60 14 97 %  12/30/19 0813 (!) 155/74 -- -- 60 15 100 %  12/30/19 0808 (!) 159/80 -- -- 61 17 99 %  12/30/19 0803 (!) 167/82 -- -- 62 13 97 %  12/30/19 0758 (!) 181/78 -- -- 62 17 100 %    @flow {1959:LAST@   Intake/Output from previous day:   06/07 0701 - 06/08 0700 In: 3500.2 [P.O.:896; I.V.:2304.2] Out: 2100 [Urine:2025]   Intake/Output this shift:   No intake/output data recorded.   Intake/Output      06/07 0701 - 06/08 0700 06/08 0701 - 06/09 0700   P.O. 896    I.V. (mL/kg) 2304.2 (35)    IV Piggyback 300    Total Intake(mL/kg) 3500.2  (53.2)    Urine (mL/kg/hr) 2025 (1.3)    Blood 75    Total Output 2100    Net +1400.2            LABORATORY DATA: Recent Labs    12/31/19 0418  WBC 8.7  HGB 8.0*  HCT 26.8*  PLT 227   Recent Labs    12/31/19 0418  NA 138  K 4.2  CL 106  CO2 23  BUN 17  CREATININE 1.31*  GLUCOSE 111*  CALCIUM 7.9*   Lab Results  Component Value Date   INR 1.0 09/14/2019   INR 1.00 12/09/2017   INR 0.99 05/05/2016    Examination:  General appearance: alert, cooperative and no distress Extremities: extremities normal, atraumatic, no cyanosis or edema  Wound Exam: clean, dry, intact   Drainage:  None: wound tissue dry  Motor Exam: Quadriceps and Hamstrings Intact  Sensory Exam: Superficial Peroneal, Deep Peroneal and Tibial normal   Assessment:    1 Day Post-Op  Procedure(s) (LRB): TOTAL KNEE ARTHROPLASTY (Left)  ADDITIONAL DIAGNOSIS:  Active Problems:   S/P total knee replacement     Plan: Physical Therapy as ordered Weight Bearing as Tolerated (WBAT)  DVT Prophylaxis:  Aspirin  DISCHARGE PLAN: Home  DISCHARGE NEEDS: HHPT   patient doing well, will need to pass PT prior to D/C home    Patient's anticipated LOS is less than 2 midnights, meeting these requirements: - Lives within 1 hour of care - Has a competent adult at home to recover with post-op recover - NO history of  - Chronic pain requiring opiods  - Diabetes  - Coronary Artery Disease  - Heart failure  - Heart attack  - Stroke  - DVT/VTE  - Cardiac arrhythmia  - Respiratory Failure/COPD  - Renal failure  - Anemia  - Advanced Liver disease        Donia Ast 12/31/2019, 7:32 AM

## 2019-12-31 NOTE — Plan of Care (Signed)
Plan of care reviewed and discussed with the patient. 

## 2019-12-31 NOTE — Discharge Summary (Signed)
SPORTS MEDICINE & JOINT REPLACEMENT   Felicia Mulch, MD   Felicia Shadow, PA-C [place], [place], [place]  [address]                             [phone]  PATIENT ID: [name]        MRN:  [id number]          DOB/AGE: 1940/10/07 / 79 y.o.    DISCHARGE SUMMARY  ADMISSION DATE:    12/30/2019 DISCHARGE DATE:   12/31/2019   ADMISSION DIAGNOSIS: S/P total knee replacement [Z96.659]    DISCHARGE DIAGNOSIS:  Osteoarthritis left knee    ADDITIONAL DIAGNOSIS: Active Problems:   S/P total knee replacement  Past Medical History:  Diagnosis Date  . Adenocarcinoma of right lung, stage 1 ([place]) 07/28/2016  . Anxiety   . Asthma   . Cancer ([place])    uterine  . COPD (chronic obstructive pulmonary disease) ([place])   . Coronary artery disease    mild nonobstructive by 2019 cath  . Cyst    left side of neck  . Depression   . GERD (gastroesophageal reflux disease)   . Heart murmur   . Hyperlipidemia   . Hypertension   . Low back pain   . Osteoarthritis   . Osteoporosis   . Takotsubo cardiomyopathy    EF recovered to 50-55% by 01/23/18 echo    PROCEDURE: Procedure(s): TOTAL KNEE ARTHROPLASTY on 12/30/2019  CONSULTS:    HISTORY:  See H&P in chart  HOSPITAL COURSE:  [name] is a 79 y.o. admitted on 12/30/2019 and found to have a diagnosis of Osteoarthritis left knee.  After appropriate laboratory studies were obtained  they were taken to the operating room on 12/30/2019 and underwent Procedure(s): TOTAL KNEE ARTHROPLASTY.   They were given perioperative antibiotics:  Anti-infectives (From admission, onward)   Start     Dose/Rate Route Frequency Ordered Stop   12/30/19 1430  ceFAZolin (ANCEF) IVPB 2g/100 mL premix     2 g 200 mL/hr over 30 Minutes Intravenous Every 6 hours 12/30/19 1222 12/30/19 2049   12/30/19 0645  vancomycin (VANCOCIN) IVPB 1000 mg/200 mL premix     1,000 mg 200 mL/hr over 60 Minutes Intravenous On call to O.R. 12/30/19 0630 12/30/19 0823    12/30/19 0645  ceFAZolin (ANCEF) IVPB 2g/100 mL premix     2 g 200 mL/hr over 30 Minutes Intravenous On call to O.R. 12/30/19 1601 12/30/19 0932    .  Patient given tranexamic acid IV or topical and exparel intra-operatively.  Tolerated the procedure well.    POD# 1: Vital signs were stable.  Patient denied Chest pain, shortness of breath, or calf pain.  Patient was started on Aspirin twice daily at 8am.  Consults to PT, OT, and care management were made.  The patient was weight bearing as tolerated.  CPM was placed on the operative leg 0-90 degrees for 6-8 hours a day. When out of the CPM, patient was placed in the foam block to achieve full extension. Incentive spirometry was taught.  Dressing was changed.       POD #2, Continued  PT for ambulation and exercise program.  IV saline locked.  O2 discontinued.    The remainder of the hospital course was dedicated to ambulation and strengthening.   The patient was discharged on 1 Day Post-Op in  Good condition.  Blood products given:none  DIAGNOSTIC STUDIES:  Recent vital signs:  Patient Vitals for the past 24 hrs:  BP Temp Temp src Pulse Resp SpO2  12/31/19 0516 127/71 (!) 97.5 F (36.4 C) Oral (!) 57 17 98 %  12/31/19 0118 123/68 98 F (36.7 C) Axillary (!) 55 17 98 %  12/30/19 2106 111/63 97.8 F (36.6 C) Oral 64 16 97 %  12/30/19 1801 -- -- -- -- -- 97 %  12/30/19 1752 121/70 97.8 F (36.6 C) -- 62 18 97 %  12/30/19 1533 105/83 98.2 F (36.8 C) Oral 92 18 --  12/30/19 1440 (!) 150/78 -- -- 63 16 94 %  12/30/19 1328 (!) 143/82 98.4 F (36.9 C) Oral (!) 59 16 95 %  12/30/19 1231 (!) 138/95 97.6 F (36.4 C) Oral (!) 59 16 99 %  12/30/19 1200 (!) 172/89 (!) 97.5 F (36.4 C) -- (!) 58 12 97 %  12/30/19 1130 (!) 156/75 (!) 97.5 F (36.4 C) -- (!) 56 13 94 %  12/30/19 1115 (!) 162/103 -- -- (!) 56 15 98 %  12/30/19 1100 (!) 108/97 -- -- (!) 56 12 100 %  12/30/19 1045 134/63 -- -- (!) 55 13 100 %  12/30/19 1034 113/68 97.6 F  (36.4 C) -- 60 14 97 %  12/30/19 0813 (!) 155/74 -- -- 60 15 100 %  12/30/19 0808 (!) 159/80 -- -- 61 17 99 %  12/30/19 0803 (!) 167/82 -- -- 62 13 97 %  12/30/19 0758 (!) 181/78 -- -- 62 17 100 %       Recent laboratory studies: Recent Labs    12/31/19 0418  WBC 8.7  HGB 8.0*  HCT 26.8*  PLT 227   Recent Labs    12/31/19 0418  NA 138  K 4.2  CL 106  CO2 23  BUN 17  CREATININE 1.31*  GLUCOSE 111*  CALCIUM 7.9*   Lab Results  Component Value Date   INR 1.0 09/14/2019   INR 1.00 12/09/2017   INR 0.99 05/05/2016     Recent Radiographic Studies :  No results found.  DISCHARGE INSTRUCTIONS: Discharge Instructions    Call MD / Call 911   Complete by: As directed    If you experience chest pain or shortness of breath, CALL 911 and be transported to the hospital emergency room.  If you develope a fever above 101 F, pus (white drainage) or increased drainage or redness at the wound, or calf pain, call your surgeon's office.   Constipation Prevention   Complete by: As directed    Drink plenty of fluids.  Prune juice may be helpful.  You may use a stool softener, such as Colace (over the counter) 100 mg twice a day.  Use MiraLax (over the counter) for constipation as needed.   Diet - low sodium heart healthy   Complete by: As directed    Discharge instructions   Complete by: As directed    INSTRUCTIONS AFTER JOINT REPLACEMENT   Remove items at home which could result in a fall. This includes throw rugs or furniture in walking pathways ICE to the affected joint every three hours while awake for 30 minutes at a time, for at least the first 3-5 days, and then as needed for pain and swelling.  Continue to use ice for pain and swelling. You may notice swelling that will progress down to the foot and ankle.  This is normal after surgery.  Elevate your leg when you are not up walking on  it.   Continue to use the breathing machine you got in the hospital (incentive spirometer)  which will help keep your temperature down.  It is common for your temperature to cycle up and down following surgery, especially at night when you are not up moving around and exerting yourself.  The breathing machine keeps your lungs expanded and your temperature down.   DIET:  As you were doing prior to hospitalization, we recommend a well-balanced diet.  DRESSING / WOUND CARE / SHOWERING  Keep the surgical dressing until follow up.  The dressing is water proof, so you can shower without any extra covering.  IF THE DRESSING FALLS OFF or the wound gets wet inside, change the dressing with sterile gauze.  Please use good hand washing techniques before changing the dressing.  Do not use any lotions or creams on the incision until instructed by your surgeon.    ACTIVITY  Increase activity slowly as tolerated, but follow the weight bearing instructions below.   No driving for 6 weeks or until further direction given by your physician.  You cannot drive while taking narcotics.  No lifting or carrying greater than 10 lbs. until further directed by your surgeon. Avoid periods of inactivity such as sitting longer than an hour when not asleep. This helps prevent blood clots.  You may return to work once you are authorized by your doctor.     WEIGHT BEARING   Weight bearing as tolerated with assist device (walker, cane, etc) as directed, use it as long as suggested by your surgeon or therapist, typically at least 4-6 weeks.   EXERCISES  Results after joint replacement surgery are often greatly improved when you follow the exercise, range of motion and muscle strengthening exercises prescribed by your doctor. Safety measures are also important to protect the joint from further injury. Any time any of these exercises cause you to have increased pain or swelling, decrease what you are doing until you are comfortable again and then slowly increase them. If you have problems or questions, call your  caregiver or physical therapist for advice.   Rehabilitation is important following a joint replacement. After just a few days of immobilization, the muscles of the leg can become weakened and shrink (atrophy).  These exercises are designed to build up the tone and strength of the thigh and leg muscles and to improve motion. Often times heat used for twenty to thirty minutes before working out will loosen up your tissues and help with improving the range of motion but do not use heat for the first two weeks following surgery (sometimes heat can increase post-operative swelling).   These exercises can be done on a training (exercise) mat, on the floor, on a table or on a bed. Use whatever works the best and is most comfortable for you.    Use music or television while you are exercising so that the exercises are a pleasant break in your day. This will make your life better with the exercises acting as a break in your routine that you can look forward to.   Perform all exercises about fifteen times, three times per day or as directed.  You should exercise both the operative leg and the other leg as well.   Exercises include:   Quad Sets - Tighten up the muscle on the front of the thigh (Quad) and hold for 5-10 seconds.   Straight Leg Raises - With your knee straight (if you were given a brace, keep  it on), lift the leg to 60 degrees, hold for 3 seconds, and slowly lower the leg.  Perform this exercise against resistance later as your leg gets stronger.  Leg Slides: Lying on your back, slowly slide your foot toward your buttocks, bending your knee up off the floor (only go as far as is comfortable). Then slowly slide your foot back down until your leg is flat on the floor again.  Angel Wings: Lying on your back spread your legs to the side as far apart as you can without causing discomfort.  Hamstring Strength:  Lying on your back, push your heel against the floor with your leg straight by tightening up the  muscles of your buttocks.  Repeat, but this time bend your knee to a comfortable angle, and push your heel against the floor.  You may put a pillow under the heel to make it more comfortable if necessary.   A rehabilitation program following joint replacement surgery can speed recovery and prevent re-injury in the future due to weakened muscles. Contact your doctor or a physical therapist for more information on knee rehabilitation.    CONSTIPATION  Constipation is defined medically as fewer than three stools per week and severe constipation as less than one stool per week.  Even if you have a regular bowel pattern at home, your normal regimen is likely to be disrupted due to multiple reasons following surgery.  Combination of anesthesia, postoperative narcotics, change in appetite and fluid intake all can affect your bowels.   YOU MUST use at least one of the following options; they are listed in order of increasing strength to get the job done.  They are all available over the counter, and you may need to use some, POSSIBLY even all of these options:    Drink plenty of fluids (prune juice may be helpful) and high fiber foods Colace 100 mg by mouth twice a day  Senokot for constipation as directed and as needed Dulcolax (bisacodyl), take with full glass of water  Miralax (polyethylene glycol) once or twice a day as needed.  If you have tried all these things and are unable to have a bowel movement in the first 3-4 days after surgery call either your surgeon or your primary doctor.    If you experience loose stools or diarrhea, hold the medications until you stool forms back up.  If your symptoms do not get better within 1 week or if they get worse, check with your doctor.  If you experience "the worst abdominal pain ever" or develop nausea or vomiting, please contact the office immediately for further recommendations for treatment.   ITCHING:  If you experience itching with your medications,  try taking only a single pain pill, or even half a pain pill at a time.  You can also use Benadryl over the counter for itching or also to help with sleep.   TED HOSE STOCKINGS:  Use stockings on both legs until for at least 2 weeks or as directed by physician office. They may be removed at night for sleeping.  MEDICATIONS:  See your medication summary on the "After Visit Summary" that nursing will review with you.  You may have some home medications which will be placed on hold until you complete the course of blood thinner medication.  It is important for you to complete the blood thinner medication as prescribed.  PRECAUTIONS:  If you experience chest pain or shortness of breath - call 911 immediately for transfer  to the hospital emergency department.   If you develop a fever greater that 101 F, purulent drainage from wound, increased redness or drainage from wound, foul odor from the wound/dressing, or calf pain - CONTACT YOUR SURGEON.                                                   FOLLOW-UP APPOINTMENTS:  If you do not already have a post-op appointment, please call the office for an appointment to be seen by your surgeon.  Guidelines for how soon to be seen are listed in your "After Visit Summary", but are typically between 1-4 weeks after surgery.  OTHER INSTRUCTIONS:   Knee Replacement:  Do not place pillow under knee, focus on keeping the knee straight while resting. CPM instructions: 0-90 degrees, 2 hours in the morning, 2 hours in the afternoon, and 2 hours in the evening. Place foam block, curve side up under heel at all times except when in CPM or when walking.  DO NOT modify, tear, cut, or change the foam block in any way.   DENTAL ANTIBIOTICS:  In most cases prophylactic antibiotics for Dental procdeures after total joint surgery are not necessary.  Exceptions are as follows:  1. History of prior total joint infection  2. Severely immunocompromised (Organ Transplant, cancer  chemotherapy, Rheumatoid biologic meds such as [place])  3. Poorly controlled diabetes (A1C &gt; 8.0, blood glucose over 200)  If you have one of these conditions, contact your surgeon for an antibiotic prescription, prior to your dental procedure.   MAKE SURE YOU:  Understand these instructions.  Get help right away if you are not doing well or get worse.    Thank you for letting us be a part of your medical care team.  It is a privilege we respect greatly.  We hope these instructions will help you stay on track for a fast and full recovery!   Increase activity slowly as tolerated   Complete by: As directed       DISCHARGE MEDICATIONS:   Allergies as of 12/31/2019      Reactions   Boniva [ibandronate Sodium] Other (See Comments)   UNSPECIFIED REACTION    Lyrica [pregabalin] Nausea And Vomiting   Dizziness and blurred vision as well   Levofloxacin       Medication List    STOP taking these medications   meloxicam 7.5 MG tablet Commonly known as: MOBIC     TAKE these medications   acetaminophen 500 MG tablet Commonly known as: TYLENOL Take 2 tablets (1,000 mg total) by mouth every 6 (six) hours.   albuterol 108 (90 Base) MCG/ACT inhaler Commonly known as: VENTOLIN HFA Inhale 2 puffs into the lungs every 6 (six) hours as needed. What changed:   when to take this  reasons to take this   albuterol (2.5 MG/3ML) 0.083% nebulizer solution Commonly known as: PROVENTIL Take 3 mLs (2.5 mg total) by nebulization every 2 (two) hours as needed for wheezing or shortness of breath. What changed: Another medication with the same name was changed. Make sure you understand how and when to take each.   aluminum-magnesium hydroxide-simethicone 427-062-37 MG/5ML Susp Commonly known as: MAALOX Take 30 mLs by mouth as needed (indigestion).   amLODipine 5 MG tablet Commonly known as: NORVASC Take 5 mg by mouth daily.   aspirin  325 MG EC tablet Take 1 tablet (325 mg total) by  mouth 2 (two) times daily. What changed: when to take this   atorvastatin 20 MG tablet Commonly known as: LIPITOR Take 20 mg by mouth every evening.   Calcium-Vitamin D 600-400 MG-UNIT Tabs Take 1 tablet by mouth daily.   diazepam 5 MG tablet Commonly known as: VALIUM Take 0.5 tablets (2.5 mg total) by mouth every 8 (eight) hours as needed for anxiety (for nerves). Must last 30 days. Filled 11-28-17 What changed: how much to take   ferrous sulfate 325 (65 FE) MG tablet Take 325 mg by mouth daily with breakfast.   furosemide 20 MG tablet Commonly known as: LASIX Take 20 mg by mouth 2 (two) times a week. Monday and Wednesday   gabapentin 400 MG capsule Commonly known as: NEURONTIN Take 1 capsule (400 mg total) by mouth 4 (four) times daily.   lisinopril 10 MG tablet Commonly known as: ZESTRIL Take 1 tablet (10 mg total) by mouth every morning.   metoCLOPramide 5 MG tablet Commonly known as: REGLAN Take 5 mg by mouth 2 (two) times daily.   metoprolol tartrate 25 MG tablet Commonly known as: LOPRESSOR Take 0.5 tablets (12.5 mg total) by mouth 2 (two) times daily. What changed: when to take this   mirtazapine 7.5 MG tablet Commonly known as: REMERON Take 7.5 mg by mouth at bedtime.   ondansetron 4 MG disintegrating tablet Commonly known as: Zofran ODT 4mg  ODT q4 hours prn nausea/vomit   oxybutynin 5 MG 24 hr tablet Commonly known as: DITROPAN-XL Take 5 mg by mouth at bedtime.   oxyCODONE 5 MG immediate release tablet Commonly known as: Oxy IR/ROXICODONE Take 1-2 tablets (5-10 mg total) by mouth every 6 (six) hours as needed for moderate pain (pain score 4-6).   pantoprazole 40 MG tablet Commonly known as: PROTONIX Take 1 tablet (40 mg total) by mouth daily at 6 (six) AM.   raloxifene 60 MG tablet Commonly known as: EVISTA Take 60 mg by mouth daily.   sertraline 100 MG tablet Commonly known as: ZOLOFT Take 100 mg by mouth in the morning and at bedtime.    tiZANidine 2 MG tablet Commonly known as: ZANAFLEX Take 1 tablet (2 mg total) by mouth every 6 (six) hours as needed. What changed: reasons to take this   traMADol 50 MG tablet Commonly known as: ULTRAM Take 1 tablet (50 mg total) by mouth 3 (three) times daily as needed.   Trelegy Ellipta 100-62.5-25 MCG/INH Aepb Generic drug: Fluticasone-Umeclidin-Vilant Inhale 1 puff into the lungs daily.            Durable Medical Equipment  (From admission, onward)         Start     Ordered   12/30/19 1222  DME Walker rolling  Once    Question:  Patient needs a walker to treat with the following condition  Answer:  S/P total knee replacement   12/30/19 1222   12/30/19 1222  DME 3 n 1  Once     12/30/19 1222   12/30/19 1222  DME Bedside commode  Once    Question:  Patient needs a bedside commode to treat with the following condition  Answer:  S/P total knee replacement   12/30/19 1222          FOLLOW UP VISIT:    DISPOSITION: HOME VS. SNF  CONDITION:  Good   Donia Ast 12/31/2019, 7:34 AM

## 2019-12-31 NOTE — Progress Notes (Signed)
Physical Therapy Treatment Patient Details Name: [name] MRN: [id number] DOB: Oct 24, 1940 Today's Date: 12/31/2019    History of Present Illness Patient is 79 y.o. female s/p Lt TKA on 12/30/19 with PMH significant for osteoporosis, OA, HTN, HLD, GERD, CAD, COPD, uterine cancer, lung cancer, asthma, anxitey, Rt TKA in 2020, Lt hip surgery.    PT Comments    The patient states, I need a nerve Pill". RN notified. Multimodal cues for safety, redirection frequently. Patient did perform 2 steps, left knee slightly buckling. Will practice again prior to Dc today.   Follow Up Recommendations  Follow surgeon's recommendation for DC plan and follow-up therapies;Home health PT     Equipment Recommendations  None recommended by PT    Recommendations for Other Services       Precautions / Restrictions Precautions Precautions: Fall;Knee Restrictions Weight Bearing Restrictions: (P) No    Mobility  Bed Mobility Overal bed mobility: Needs Assistance Bed Mobility: Supine to Sit     Supine to sit: Supervision;HOB elevated     General bed mobility comments: multimodal cues for safety and technique  Transfers Overall transfer level: Needs assistance Equipment used: Rolling walker (2 wheeled) Transfers: Sit to/from Stand Sit to Stand: Min assist         General transfer comment: cues for safe hand placement/technique with RW for power up, assist to complete and steady with rising.   Ambulation/Gait Ambulation/Gait assistance: Min assist Gait Distance (Feet): 40 Feet Assistive device: Rolling walker (2 wheeled) Gait Pattern/deviations: Step-to pattern;Decreased stride length;Decreased stance time - left;Decreased weight shift to left Gait velocity: decreased   General Gait Details: multomodal cues for  safety , sequence, safety for preparing to go up steps.   Stairs Stairs: Yes Stairs assistance: Min assist Stair Management: One rail Right;Forwards Number of Stairs:  2 General stair comments: HHA on left also, multimodal cues for safety, sequence.   Wheelchair Mobility    Modified Rankin (Stroke Patients Only)       Balance Overall balance assessment: Needs assistance   Sitting balance-Leahy Scale: Good     Standing balance support: Bilateral upper extremity supported;During functional activity Standing balance-Leahy Scale: Fair Standing balance comment: when concentrating on left knee                            Cognition   Behavior During Therapy: WFL for tasks assessed/performed Overall Cognitive Status: No family/caregiver present to determine baseline cognitive functioning                                 General Comments: requires frequent redirection to concentrte on safety      Exercises Total Joint Exercises Quad Sets: AROM;Left;Seated;10 reps Short Arc Quad: AROM;10 reps;Seated;Left Long Arc Quad: AROM;10 reps;Left;Seated Knee Flexion: AROM;Left;10 reps;Seated Goniometric ROM: 10- 60 knee flexion. left    General Comments        Pertinent Vitals/Pain Pain Assessment: Faces Faces Pain Scale: Hurts little more Pain Location: Lt knee, when bending Pain Descriptors / Indicators: Aching;Discomfort;Grimacing;Guarding;Moaning Pain Intervention(s): Monitored during session;Premedicated before session;Ice applied    Home Living                      Prior Function            PT Goals (current goals can now be found in the care plan section) Progress towards PT  goals: Progressing toward goals    Frequency    7X/week      PT Plan Current plan remains appropriate    Co-evaluation              AM-PAC PT "6 Clicks" Mobility   Outcome Measure  Help needed turning from your back to your side while in a flat bed without using bedrails?: A Little Help needed moving from lying on your back to sitting on the side of a flat bed without using bedrails?: A Little Help needed  moving to and from a bed to a chair (including a wheelchair)?: A Little Help needed standing up from a chair using your arms (e.g., wheelchair or bedside chair)?: A Little Help needed to walk in hospital room?: A Little Help needed climbing 3-5 steps with a railing? : A Lot 6 Click Score: 17    End of Session Equipment Utilized During Treatment: Gait belt Activity Tolerance: Patient tolerated treatment well Patient left: in chair;with call bell/phone within reach;with chair alarm set Nurse Communication: Mobility status PT Visit Diagnosis: Muscle weakness (generalized) (M62.81);Difficulty in walking, not elsewhere classified (R26.2)     Time: 8721-5872 PT Time Calculation (min) (ACUTE ONLY): 49 min  Charges:  $Gait Training: 8-22 mins $Therapeutic Exercise: 8-22 mins $Self Care/Home Management: [place] Pager [phone] Office [phone]    Claretha Cooper 12/31/2019, 9:03 AM

## 2019-12-31 NOTE — Progress Notes (Signed)
   12/31/19 1400  PT Visit Information  Last PT Received On 12/31/19  Assistance Needed +1  History of Present Illness Patient is 79 y.o. female s/p Lt TKA on 12/30/19 with PMH significant for osteoporosis, OA, HTN, HLD, GERD, CAD, COPD, uterine cancer, lung cancer, asthma, anxitey, Rt TKA in 2020, Lt hip surgery.  Precautions  Precautions Fall;Knee  Pain Assessment  Faces Pain Scale 4  Pain Location Lt knee, when bending  Pain Descriptors / Indicators Aching;Discomfort;Grimacing;Guarding;Moaning  Pain Intervention(s) Premedicated before session  Cognition  Arousal/Alertness Awake/alert  Behavior During Therapy WFL for tasks assessed/performed  Overall Cognitive Status No family/caregiver present to determine baseline cognitive functioning  General Comments requires frequent redirection to concentrte on safety  Bed Mobility  Overal bed mobility Needs Assistance  Bed Mobility Sit to Supine  Sit to supine Supervision  Transfers  Overall transfer level Needs assistance  Equipment used Rolling walker (2 wheeled)  Transfers Sit to/from Stand  Sit to Stand Min guard  General transfer comment cues for safe hand placement/technique with RW for power up, assist to complete and steady with rising.   Ambulation/Gait  Ambulation/Gait assistance Min guard  Gait Distance (Feet) 60 Feet  Assistive device Rolling walker (2 wheeled)  Gait Pattern/deviations Step-to pattern;Decreased stride length;Decreased stance time - left;Decreased weight shift to left  Gait velocity decreased  Stairs assistance Min assist  Stair Management One rail Right;Forwards  Number of Stairs 2  General stair comments HHA on left also, multimodal cues for safety, sequence.  Balance  Overall balance assessment Needs assistance  Sitting-balance support Feet supported  Sitting balance-Leahy Scale Good  Standing balance support Bilateral upper extremity supported;During functional activity  Standing balance-Leahy Scale  Fair  PT - End of Session  Equipment Utilized During Treatment Gait belt  Activity Tolerance Patient tolerated treatment well  Patient left in bed;with call bell/phone within reach;with bed alarm set  Nurse Communication Mobility status   PT - Assessment/Plan  PT Plan Current plan remains appropriate  PT Visit Diagnosis Muscle weakness (generalized) (M62.81);Difficulty in walking, not elsewhere classified (R26.2)  PT Frequency (ACUTE ONLY) 7X/week  Follow Up Recommendations Follow surgeon's recommendation for DC plan and follow-up therapies;Home health PT  PT equipment None recommended by PT  AM-PAC PT "6 Clicks" Mobility Outcome Measure (Version 2)  Help needed turning from your back to your side while in a flat bed without using bedrails? 3  Help needed moving from lying on your back to sitting on the side of a flat bed without using bedrails? 3  Help needed moving to and from a bed to a chair (including a wheelchair)? 3  Help needed standing up from a chair using your arms (e.g., wheelchair or bedside chair)? 3  Help needed to walk in hospital room? 3  Help needed climbing 3-5 steps with a railing?  2  6 Click Score 17  Consider Recommendation of Discharge To: Home with [facility]  PT Goal Progression  Progress towards PT goals Progressing toward goals  PT Time Calculation  PT Start Time (ACUTE ONLY) 0932  PT Stop Time (ACUTE ONLY) 1016  PT Time Calculation (min) (ACUTE ONLY) 24 min  PT General Charges  $$ ACUTE PT VISIT 1 Visit  PT Treatments  $Gait Training 23-37 mins  Tresa Endo PT Acute Rehabilitation Services Pager [phone] Office [phone]

## 2019-12-31 NOTE — Progress Notes (Signed)
Patient discharged to home w/ son. Given all belongings, instructions, equipment. Verbalized understanding of all instructions. All questions answered. Escorted to pov via w/c.

## 2020-01-19 ENCOUNTER — Other Ambulatory Visit: Payer: Self-pay

## 2020-01-19 ENCOUNTER — Emergency Department (HOSPITAL_COMMUNITY)
Admission: EM | Admit: 2020-01-19 | Discharge: 2020-01-19 | Disposition: A | Discharge status: Home / Self Care | Payer: Medicare Other | Attending: Emergency Medicine | Admitting: Emergency Medicine

## 2020-01-19 ENCOUNTER — Emergency Department (HOSPITAL_COMMUNITY): Payer: Medicare Other

## 2020-01-19 DIAGNOSIS — I129 Hypertensive chronic kidney disease with stage 1 through stage 4 chronic kidney disease, or unspecified chronic kidney disease: Secondary | ICD-10-CM | POA: Insufficient documentation

## 2020-01-19 DIAGNOSIS — Z96651 Presence of right artificial knee joint: Secondary | ICD-10-CM | POA: Insufficient documentation

## 2020-01-19 DIAGNOSIS — Z96652 Presence of left artificial knee joint: Secondary | ICD-10-CM | POA: Insufficient documentation

## 2020-01-19 DIAGNOSIS — T40601A Poisoning by unspecified narcotics, accidental (unintentional), initial encounter: Secondary | ICD-10-CM | POA: Insufficient documentation

## 2020-01-19 DIAGNOSIS — R4182 Altered mental status, unspecified: Secondary | ICD-10-CM | POA: Diagnosis present

## 2020-01-19 DIAGNOSIS — Z87891 Personal history of nicotine dependence: Secondary | ICD-10-CM | POA: Insufficient documentation

## 2020-01-19 DIAGNOSIS — Z8542 Personal history of malignant neoplasm of other parts of uterus: Secondary | ICD-10-CM | POA: Insufficient documentation

## 2020-01-19 DIAGNOSIS — J449 Chronic obstructive pulmonary disease, unspecified: Secondary | ICD-10-CM | POA: Diagnosis not present

## 2020-01-19 DIAGNOSIS — Z7982 Long term (current) use of aspirin: Secondary | ICD-10-CM | POA: Insufficient documentation

## 2020-01-19 DIAGNOSIS — Z79899 Other long term (current) drug therapy: Secondary | ICD-10-CM | POA: Insufficient documentation

## 2020-01-19 DIAGNOSIS — I252 Old myocardial infarction: Secondary | ICD-10-CM | POA: Insufficient documentation

## 2020-01-19 DIAGNOSIS — N183 Chronic kidney disease, stage 3 unspecified: Secondary | ICD-10-CM | POA: Diagnosis not present

## 2020-01-19 LAB — CBC WITH DIFFERENTIAL/PLATELET
Abs Immature Granulocytes: 0.02 10*3/uL (ref 0.00–0.07)
Basophils Absolute: 0 10*3/uL (ref 0.0–0.1)
Basophils Relative: 1 %
Eosinophils Absolute: 0.3 10*3/uL (ref 0.0–0.5)
Eosinophils Relative: 5 %
HCT: 28.6 % — ABNORMAL LOW (ref 36.0–46.0)
Hemoglobin: 8.3 g/dL — ABNORMAL LOW (ref 12.0–15.0)
Immature Granulocytes: 0 %
Lymphocytes Relative: 20 %
Lymphs Abs: 1.3 10*3/uL (ref 0.7–4.0)
MCH: 25.5 pg — ABNORMAL LOW (ref 26.0–34.0)
MCHC: 29 g/dL — ABNORMAL LOW (ref 30.0–36.0)
MCV: 88 fL (ref 80.0–100.0)
Monocytes Absolute: 0.5 10*3/uL (ref 0.1–1.0)
Monocytes Relative: 7 %
Neutro Abs: 4.3 10*3/uL (ref 1.7–7.7)
Neutrophils Relative %: 67 %
Platelets: 314 10*3/uL (ref 150–400)
RBC: 3.25 MIL/uL — ABNORMAL LOW (ref 3.87–5.11)
RDW: 17 % — ABNORMAL HIGH (ref 11.5–15.5)
WBC: 6.5 10*3/uL (ref 4.0–10.5)
nRBC: 0 % (ref 0.0–0.2)

## 2020-01-19 LAB — COMPREHENSIVE METABOLIC PANEL
ALT: 7 U/L (ref 0–44)
AST: 15 U/L (ref 15–41)
Albumin: 2.8 g/dL — ABNORMAL LOW (ref 3.5–5.0)
Alkaline Phosphatase: 60 U/L (ref 38–126)
Anion gap: 11 (ref 5–15)
BUN: 23 mg/dL (ref 8–23)
CO2: 23 mmol/L (ref 22–32)
Calcium: 7.6 mg/dL — ABNORMAL LOW (ref 8.9–10.3)
Chloride: 105 mmol/L (ref 98–111)
Creatinine, Ser: 1.48 mg/dL — ABNORMAL HIGH (ref 0.44–1.00)
GFR calc Af Amer: 39 mL/min — ABNORMAL LOW (ref >60)
GFR calc non Af Amer: 34 mL/min — ABNORMAL LOW (ref >60)
Glucose, Bld: 108 mg/dL — ABNORMAL HIGH (ref 70–99)
Potassium: 3.6 mmol/L (ref 3.5–5.1)
Sodium: 139 mmol/L (ref 135–145)
Total Bilirubin: 0.4 mg/dL (ref 0.3–1.2)
Total Protein: 6 g/dL — ABNORMAL LOW (ref 6.5–8.1)

## 2020-01-19 LAB — CBG MONITORING, ED: Glucose-Capillary: 110 mg/dL — ABNORMAL HIGH (ref 70–99)

## 2020-01-19 IMAGING — DX DG CHEST 1V PORT
1 series · 1 of 1 positions shown · non-contrast
Comparison: [DATE]

CLINICAL DATA: Altered mental status.

EXAM:
PORTABLE CHEST 1 VIEW

[chest]
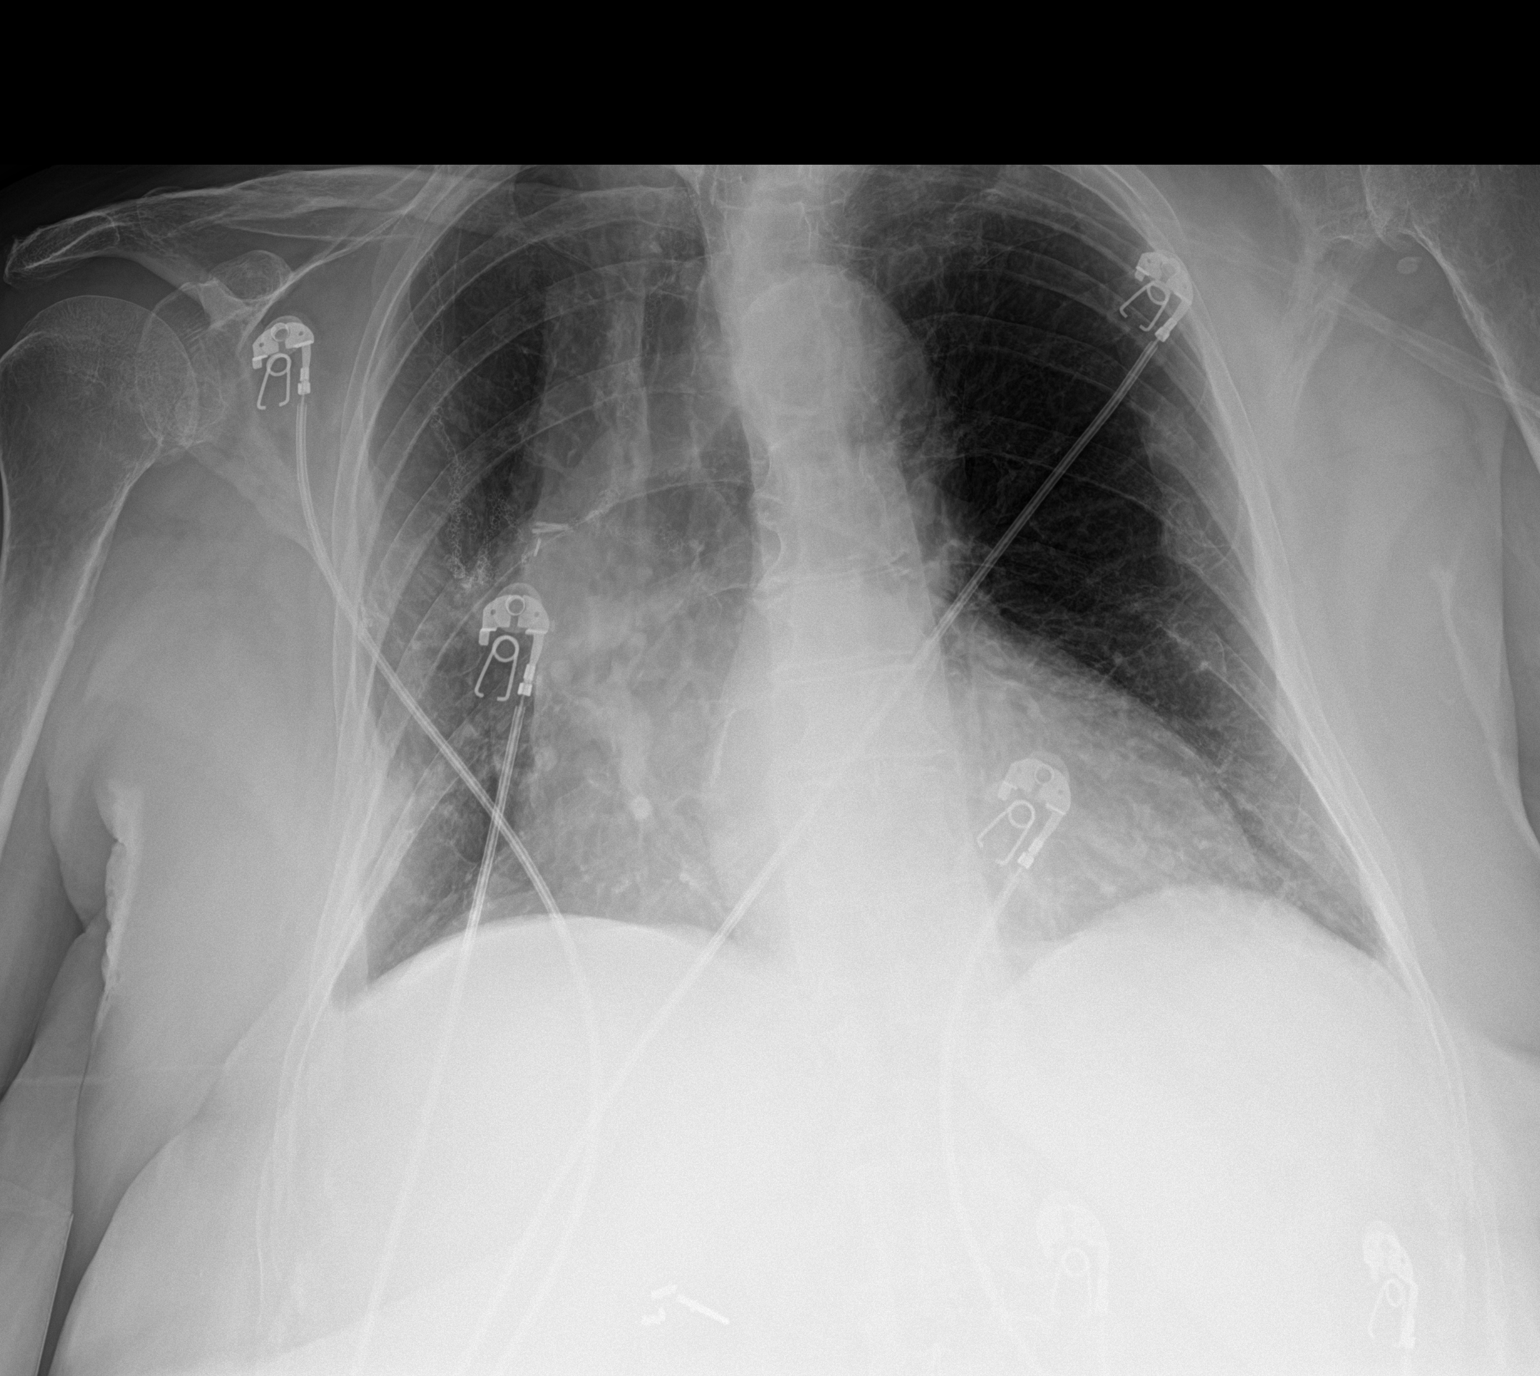

[1 of 1 positions shown; findings below may reference images not displayed]

FINDINGS: Mild enlargement of the cardiopericardial silhouette, stable. No
mediastinal or hilar masses. There are pulmonary anastomosis staples
and vascular clips extending laterally from the right hilum with
anastomosis staples extending from the mid to upper right lung,
stable from the prior exam. Lungs otherwise clear. No pleural
effusion or pneumothorax.

Old healed left rib fractures. Skeletal structures are demineralized
with no convincing acute abnormality.
IMPRESSION: No acute cardiopulmonary disease.

## 2020-01-19 NOTE — ED Triage Notes (Signed)
Pt arrives via EMS from home with complaints of AMS. Pt could not stay awake with EMS, could not hold head up, cold, clammy and pressure 60/30. Pt son reports possible tramadol OD. EMS gave 1mg  narcan with pt becoming alert and oriented X4. 1L of NS given.   76/46 with 1 liter NS 54 HR 89% RA .Marland Kitchen Placed on 2 liters [place] 98% CBG 135  18g LAC

## 2020-01-19 NOTE — Discharge Instructions (Addendum)
Do not take any other pain medication today.  And in the future when you do knee pain medication make sure you are only taking 1 tablet.

## 2020-01-19 NOTE — ED Notes (Signed)
Pt ambulated to nursing station and back with no assistance and steady gait.

## 2020-01-19 NOTE — ED Provider Notes (Signed)
[place] EMERGENCY DEPARTMENT Provider Note   CSN: [id number] Arrival date & time: 01/19/20  1044     History Chief Complaint  Patient presents with  . Drug Overdose    [name] is a 79 y.o. female.  Patient is a 79 year old female with a history of CAD, COPD, recent left knee replacement, CKD who is presenting today with altered mental status.  Spoke with patient and her son and son reports she was her normal self this morning until she took her morning medication.  Patient reports that the medication today she thought looked different than what she normally takes but she thought that maybe the labeling had just changed so she took all the medications.  However son reports approximately 30 minutes after she took the medication she started going to sleep was hard to arouse and not acting herself.  When EMS arrived they did administer Narcan and patient woke up immediately with improved blood pressure and mental status and has been at her baseline since this episode happened.  Patient reports that she feels fine and she denies any symptoms of pain or otherwise at this time.  The history is provided by the patient and a relative.  Drug Overdose This is a new problem. The current episode started less than 1 hour ago. The problem occurs constantly. The problem has been resolved. Nothing aggravates the symptoms. Nothing relieves the symptoms.       Past Medical History:  Diagnosis Date  . Adenocarcinoma of right lung, stage 1 ([place]) 07/28/2016  . Anxiety   . Asthma   . Cancer ([place])    uterine  . COPD (chronic obstructive pulmonary disease) ([place])   . Coronary artery disease    mild nonobstructive by 2019 cath  . Cyst    left side of neck  . Depression   . GERD (gastroesophageal reflux disease)   . Heart murmur   . Hyperlipidemia   . Hypertension   . Low back pain   . Osteoarthritis   . Osteoporosis   . Takotsubo cardiomyopathy    EF recovered to 50-55%  by 01/23/18 echo    Patient Active Problem List   Diagnosis Date Noted  . Cough   . Acute metabolic encephalopathy 31/54/0086  . S/P total knee replacement 05/27/2019  . 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 Bilateral renal cysts 05/21/2018  . Hiatal hernia 05/21/2018  . 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 Thoracic radiculitis (Bilateral) (L>R) 05/03/2018  . Chronic thoracic back pain (Left) 05/03/2018  . Injury of peripheral nerves of thorax, sequela 03/22/2018  . Chest pain 03/21/2018  . Rib pain (Left) 03/21/2018  . Chronic rib pain (Right) 03/20/2018  . DDD (degenerative disc disease), lumbar 03/20/2018  . Chronic fracture of pubic ramus, sequela (Right) 03/20/2018  . History of femoral neck fracture, sequela (Left) 03/20/2018    Class: History of  . Lumbar facet arthropathy (Bilateral) 03/20/2018  . Lumbar facet syndrome (Bilateral) 03/20/2018  . Osteoarthritis of facet joint of lumbar spine 03/20/2018  . Spondylosis without myelopathy or radiculopathy, lumbosacral region 03/20/2018  . Other specified dorsopathies, sacral and sacrococcygeal region 03/20/2018  . Chronic pain syndrome 02/26/2018  . Cancer related pain 02/26/2018  . 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 Chronic low back pain (Secondary Area of Pain) (Bilateral) 02/26/2018  . 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 Chronic lower extremity pain [facility] Area of Pain) (Bilateral) (R>L) 02/26/2018  .  Chronic hip pain (Fourth Area of Pain) (Bilateral) (R>L) 02/26/2018  . Neurogenic pain 02/26/2018  . Pharmacologic therapy 02/26/2018  . Problems influencing health status 02/26/2018  . Long term prescription benzodiazepine use 02/26/2018  . Long term prescription opiate use 02/26/2018  . Acute systolic heart failure ([place])   . Non-ST elevation (NSTEMI) myocardial infarction ([place])   . History of uterine cancer 12/08/2017  .  Generalized anxiety disorder 12/08/2017  . GERD (gastroesophageal reflux disease) 12/08/2017  . Substernal chest pain 12/08/2017  . Elevated brain natriuretic peptide (BNP) level 12/08/2017  . Elevated troponin 12/08/2017  . Embedded tick of upper back excluding scapular region 12/08/2017  . Chronic sacroiliac joint pain 06/21/2017  . Disorder of skeletal system 06/21/2017  . Other long term (current) drug therapy 06/21/2017  . Other specified health status 06/21/2017  . Chronic chest wall pain (Primary Area of Pain) 06/21/2017  . Chronic post-thoracotomy pain 03/03/2017  . 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 Adenocarcinoma of right lung, stage 1 ([place]) 07/28/2016  . S/P lobectomy of lung 05/09/2016  . DVT (deep venous thrombosis) ([place]) 04/26/2016  . Heart murmur   . Acute exacerbation of chronic obstructive pulmonary disease (COPD) ([place]) 08/29/2015  . Essential hypertension 08/29/2015  . Hypercholesterolemia 08/29/2015  . CKD (chronic kidney disease), stage III ([place]) 08/29/2015  . COPD (chronic obstructive pulmonary disease) ([place]) 04/27/2015  . Acute on chronic respiratory failure with hypoxia ([place]) 04/27/2015  . Infected sebaceous cyst 08/16/2011    Past Surgical History:  Procedure Laterality Date  . ANKLE SURGERY Right    horse accident  . APPENDECTOMY    . BACK SURGERY    . CHOLECYSTECTOMY    . EYE SURGERY     lense implant  . FRACTURE SURGERY    . HIP SURGERY  01/2010   Left Hip   . INNER EAR SURGERY Bilateral 1970   related to severe ear infections  . KNEE SURGERY     Right knee  . LEFT HEART CATH AND CORONARY ANGIOGRAPHY N/A 12/11/2017   Procedure: LEFT HEART CATH AND CORONARY ANGIOGRAPHY;  Surgeon: Jettie Booze, MD;  Location: [facility];  Service: Cardiovascular;  Laterality: N/A;  . LOBECTOMY Right 05/09/2016   Procedure: RIGHT UPPER LOBECTOMY;  Surgeon: [name], MD;  Location: [facility];  Service: Thoracic;  Laterality:  Right;  . MASS EXCISION  08/25/2011   Procedure: EXCISION MASS;  Surgeon: Harl Bowie, MD;  Location: [facility];  Service: General;  Laterality: N/A;  excision left neck mass  . TOTAL ABDOMINAL HYSTERECTOMY    . TOTAL KNEE ARTHROPLASTY Right 05/27/2019   Procedure: TOTAL KNEE ARTHROPLASTY;  Surgeon: Vickey Huger, MD;  Location: [facility];  Service: Orthopedics;  Laterality: Right;  75 mins needed for length of case  . TOTAL KNEE ARTHROPLASTY Left 12/30/2019   Procedure: TOTAL KNEE ARTHROPLASTY;  Surgeon: Vickey Huger, MD;  Location: [facility];  Service: Orthopedics;  Laterality: Left;  Marland Kitchen VIDEO ASSISTED THORACOSCOPY (VATS)/WEDGE RESECTION Right 05/09/2016   Procedure: VIDEO ASSISTED THORACOSCOPY (VATS)/WEDGE RESECTION;  Surgeon: [name], MD;  Location: [place];  Service: Thoracic;  Laterality: Right;     OB History   No obstetric history on file.     Family History  Problem Relation Age of Onset  . Heart disease Mother   . Heart disease Father   . Cancer Sister        #1, breast  . Heart disease Brother  had CABG    Social History   Tobacco Use  . Smoking status: Former Smoker    Packs/day: 2.00    Years: 51.00    Pack years: 102.00    Types: Cigarettes    Quit date: 05/04/2012    Years since quitting: 7.7  . Smokeless tobacco: Never Used  Vaping Use  . Vaping Use: Former  . Quit date: 12/01/2017  Substance Use Topics  . Alcohol use: No    Alcohol/week: 0.0 standard drinks  . Drug use: No    Home Medications Prior to Admission medications   Medication Sig Start Date End Date Taking? Authorizing Provider  acetaminophen (TYLENOL) 500 MG tablet Take 2 tablets (1,000 mg total) by mouth every 6 (six) hours. 05/28/19   Donia Ast, PA  albuterol (PROVENTIL HFA;VENTOLIN HFA) 108 (90 BASE) MCG/ACT inhaler Inhale 2 puffs into the lungs every 6 (six) hours as needed. Patient taking differently: Inhale 2 puffs into the lungs every 4 (four)  hours as needed for wheezing or shortness of breath.  04/28/15   Hosie Poisson, MD  albuterol (PROVENTIL) (2.5 MG/3ML) 0.083% nebulizer solution Take 3 mLs (2.5 mg total) by nebulization every 2 (two) hours as needed for wheezing or shortness of breath. 04/28/15   Hosie Poisson, MD  aluminum-magnesium hydroxide-simethicone (MAALOX) [phone] MG/5ML SUSP Take 30 mLs by mouth as needed (indigestion).    [provider]  amLODipine (NORVASC) 5 MG tablet Take 5 mg by mouth daily.    [provider]  aspirin EC 325 MG EC tablet Take 1 tablet (325 mg total) by mouth 2 (two) times daily. 12/31/19   Donia Ast, PA  atorvastatin (LIPITOR) 20 MG tablet Take 20 mg by mouth every evening.     [provider]  Calcium Carb-Cholecalciferol (CALCIUM-VITAMIN D) 600-400 MG-UNIT TABS Take 1 tablet by mouth daily.    [provider]  diazepam (VALIUM) 5 MG tablet Take 0.5 tablets (2.5 mg total) by mouth every 8 (eight) hours as needed for anxiety (for nerves). Must last 30 days. Filled 11-28-17 Patient taking differently: Take 5 mg by mouth every 8 (eight) hours as needed for anxiety (for nerves). Must last 30 days. Filled 11-28-17 06/05/19   Donia Ast, PA  ferrous sulfate 325 (65 FE) MG tablet Take 325 mg by mouth daily with breakfast.    [provider]  furosemide (LASIX) 20 MG tablet Take 20 mg by mouth 2 (two) times a week. Monday and Wednesday 11/04/19   [provider]  gabapentin (NEURONTIN) 400 MG capsule Take 1 capsule (400 mg total) by mouth 4 (four) times daily. 05/24/18 12/17/19  Vevelyn Francois, NP  lisinopril (PRINIVIL,ZESTRIL) 10 MG tablet Take 1 tablet (10 mg total) by mouth every morning. 12/12/17   Domenic Polite, MD  metoCLOPramide (REGLAN) 5 MG tablet Take 5 mg by mouth 2 (two) times daily.     [provider]  metoprolol tartrate (LOPRESSOR) 25 MG tablet Take 0.5 tablets (12.5 mg total) by mouth 2 (two) times  daily. Patient taking differently: Take 12.5 mg by mouth every morning.  12/12/17   Domenic Polite, MD  mirtazapine (REMERON) 7.5 MG tablet Take 7.5 mg by mouth at bedtime. 11/04/19   [provider]  ondansetron (ZOFRAN ODT) 4 MG disintegrating tablet 4mg  ODT q4 hours prn nausea/vomit Patient not taking: Reported on 12/17/2019 07/09/19   Deno Etienne, DO  oxybutynin (DITROPAN-XL) 5 MG 24 hr tablet Take 5 mg by mouth at bedtime.  09/06/19   [provider]  oxyCODONE (OXY IR/ROXICODONE) 5 MG immediate release tablet Take 1-2 tablets (5-10 mg total) by mouth every 6 (six) hours as needed for moderate pain (pain score 4-6). 12/31/19   Donia Ast, PA  pantoprazole (PROTONIX) 40 MG tablet Take 1 tablet (40 mg total) by mouth daily at 6 (six) AM. 04/28/15   Hosie Poisson, MD  raloxifene (EVISTA) 60 MG tablet Take 60 mg by mouth daily.    [provider]  sertraline (ZOLOFT) 100 MG tablet Take 100 mg by mouth in the morning and at bedtime.     [provider]  tiZANidine (ZANAFLEX) 2 MG tablet Take 1 tablet (2 mg total) by mouth every 6 (six) hours as needed. Patient taking differently: Take 2 mg by mouth every 6 (six) hours as needed for muscle spasms.  05/28/19   Donia Ast, PA  traMADol (ULTRAM) 50 MG tablet Take 1 tablet (50 mg total) by mouth 3 (three) times daily as needed. 06/05/19   Donia Ast, PA  TRELEGY ELLIPTA 100-62.5-25 MCG/INH AEPB Inhale 1 puff into the lungs daily. 05/14/19   [provider]    Allergies    Boniva [ibandronate sodium], Lyrica [pregabalin], and Levofloxacin  Review of Systems   Review of Systems  All other systems reviewed and are negative.   Physical Exam Updated Vital Signs BP 94/61 (BP Location: Right Arm)   Pulse (!) 53   Temp 97.6 F (36.4 C) (Oral)   Resp 16   Ht 5\' 2"  (1.575 m)   Wt 65.8 kg   SpO2 99%   BMI 26.53 kg/m   Physical Exam Vitals and nursing note reviewed.  Constitutional:       General: She is in acute distress.     Appearance: She is well-developed.  HENT:     Head: Normocephalic and atraumatic.  Eyes:     Pupils: Pupils are equal, round, and reactive to light.  Cardiovascular:     Rate and Rhythm: Normal rate and regular rhythm.     Heart sounds: Normal heart sounds. No murmur heard.  No friction rub.  Pulmonary:     Effort: Pulmonary effort is normal.     Breath sounds: Normal breath sounds. No wheezing or rales.  Abdominal:     General: Bowel sounds are normal. There is no distension.     Palpations: Abdomen is soft.     Tenderness: There is no abdominal tenderness. There is no guarding or rebound.  Musculoskeletal:        General: No tenderness. Normal range of motion.     Comments: No edema.  Healing left knee replacement without significant erythema or drainage.  Mild swelling and warmth is still present  Skin:    General: Skin is warm and dry.     Capillary Refill: Capillary refill takes less than 2 seconds.     Findings: No rash.  Neurological:     General: No focal deficit present.     Mental Status: She is alert and oriented to person, place, and time. Mental status is at baseline.     Cranial Nerves: No cranial nerve deficit.  Psychiatric:        Mood and Affect: Mood normal.        Behavior: Behavior normal.        Thought Content: Thought content normal.     ED Results / Procedures / Treatments   Labs (all labs ordered are listed, but  only abnormal results are displayed) Labs Reviewed  CBC WITH DIFFERENTIAL/PLATELET - Abnormal; Notable for the following components:      Result Value   RBC 3.25 (*)    Hemoglobin 8.3 (*)    HCT 28.6 (*)    MCH 25.5 (*)    MCHC 29.0 (*)    RDW 17.0 (*)    All other components within normal limits  COMPREHENSIVE METABOLIC PANEL - Abnormal; Notable for the following components:   Glucose, Bld 108 (*)    Creatinine, Ser 1.48 (*)    Calcium 7.6 (*)    Total Protein 6.0 (*)    Albumin 2.8 (*)     GFR calc non Af Amer 34 (*)    GFR calc Af Amer 39 (*)    All other components within normal limits  CBG MONITORING, ED - Abnormal; Notable for the following components:   Glucose-Capillary 110 (*)    All other components within normal limits    EKG EKG Interpretation  Date/Time:  Sunday January 19 2020 11:06:07 EDT Ventricular Rate:  53 PR Interval:    QRS Duration: 95 QT Interval:  559 QTC Calculation: 525 R Axis:   43 Text Interpretation: Sinus rhythm new Prolonged QT interval Confirmed by Blanchie Dessert [phone]) on 01/19/2020 11:13:15 AM   Radiology No results found.  Procedures Procedures (including critical care time)  Medications Ordered in ED Medications - No data to display  ED Course  I have reviewed the triage vital signs and the nursing notes.  Pertinent labs & imaging results that were available during my care of the patient were reviewed by me and considered in my medical decision making (see chart for details).    MDM Rules/Calculators/A&P                          Patient presenting today with symptoms most consistent with accidental overdose of an opiate medication.  Spoke with patient's son Merry Proud on the phone and it is difficult to say exactly what medication it was however he is can bring all of her medications up to the hospital.  Patient did receive Narcan and is now at her baseline.  Will monitor to ensure there is no recurrent symptoms.  Otherwise patient initially was hypotensive which has has improved.  At this time will monitor but no intervention is required.  EKG without acute findings except for prolonged QT interval.  Labs pending.  1:06 PM Patient's labs without acute findings with persistent anemia after her knee replacement but slightly improved now at 8.3.  CMP without acute changes.  On reevaluation patient is still awake and alert.  Blood pressure is now improved to 932 systolic.  Patient's son arrived and brought her bag of medications.   When looking through patient had 5 bottles of tramadol and patient reported she did not realize that they were repeated medications and she was just taking 1 of each.  This is most likely what caused her symptoms today.  She also had duplicate bottles of tizanidine and diazepam.  These medications were separated from her and her son is now going to help her to ensure she is taking the appropriate medication.  Patient was able to ambulate here and otherwise remained well-appearing for greater than 2 hours.  Feel that she is stable for discharge home.  MDM Number of Diagnoses or Management Options   Amount and/or Complexity of Data Reviewed Clinical lab tests: ordered and reviewed Tests  in the medicine section of CPT: ordered and reviewed Decide to obtain previous medical records or to obtain history from someone other than the patient: yes Obtain history from someone other than the patient: yes Review and summarize past medical records: yes Discuss the patient with other providers: no Independent visualization of images, tracings, or specimens: yes  Risk of Complications, Morbidity, and/or Mortality Presenting problems: moderate Diagnostic procedures: low Management options: low  Patient Progress Patient progress: improved   Final Clinical Impression(s) / ED Diagnoses Final diagnoses:  Overdose of opiate or related narcotic, accidental or unintentional, initial encounter [facility])    Rx / [place] Orders ED Discharge Orders    None       Blanchie Dessert, MD 01/19/20 1308

## 2020-06-10 ENCOUNTER — Emergency Department (HOSPITAL_COMMUNITY): Payer: Medicare Other

## 2020-06-10 ENCOUNTER — Inpatient Hospital Stay (HOSPITAL_COMMUNITY)
Admission: EM | Admit: 2020-06-10 | Discharge: 2020-06-16 | DRG: 510 | Disposition: A | Discharge status: Skilled Nursing Facility | Payer: Medicare Other | Attending: Internal Medicine | Admitting: Internal Medicine

## 2020-06-10 ENCOUNTER — Encounter (HOSPITAL_COMMUNITY): Payer: Self-pay | Admitting: Emergency Medicine

## 2020-06-10 DIAGNOSIS — I2510 Atherosclerotic heart disease of native coronary artery without angina pectoris: Secondary | ICD-10-CM | POA: Diagnosis present

## 2020-06-10 DIAGNOSIS — Z881 Allergy status to other antibiotic agents status: Secondary | ICD-10-CM

## 2020-06-10 DIAGNOSIS — S52612A Displaced fracture of left ulna styloid process, initial encounter for closed fracture: Secondary | ICD-10-CM | POA: Diagnosis present

## 2020-06-10 DIAGNOSIS — Y92009 Unspecified place in unspecified non-institutional (private) residence as the place of occurrence of the external cause: Secondary | ICD-10-CM

## 2020-06-10 DIAGNOSIS — S52502A Unspecified fracture of the lower end of left radius, initial encounter for closed fracture: Secondary | ICD-10-CM | POA: Diagnosis present

## 2020-06-10 DIAGNOSIS — G40909 Epilepsy, unspecified, not intractable, without status epilepticus: Secondary | ICD-10-CM | POA: Diagnosis present

## 2020-06-10 DIAGNOSIS — Z8542 Personal history of malignant neoplasm of other parts of uterus: Secondary | ICD-10-CM

## 2020-06-10 DIAGNOSIS — S52552A Other extraarticular fracture of lower end of left radius, initial encounter for closed fracture: Secondary | ICD-10-CM | POA: Diagnosis not present

## 2020-06-10 DIAGNOSIS — Z20822 Contact with and (suspected) exposure to covid-19: Secondary | ICD-10-CM | POA: Diagnosis present

## 2020-06-10 DIAGNOSIS — Z888 Allergy status to other drugs, medicaments and biological substances status: Secondary | ICD-10-CM

## 2020-06-10 DIAGNOSIS — E559 Vitamin D deficiency, unspecified: Secondary | ICD-10-CM | POA: Diagnosis present

## 2020-06-10 DIAGNOSIS — M1990 Unspecified osteoarthritis, unspecified site: Secondary | ICD-10-CM | POA: Diagnosis present

## 2020-06-10 DIAGNOSIS — Z803 Family history of malignant neoplasm of breast: Secondary | ICD-10-CM

## 2020-06-10 DIAGNOSIS — Z79891 Long term (current) use of opiate analgesic: Secondary | ICD-10-CM

## 2020-06-10 DIAGNOSIS — F0390 Unspecified dementia without behavioral disturbance: Secondary | ICD-10-CM | POA: Diagnosis present

## 2020-06-10 DIAGNOSIS — S72113A Displaced fracture of greater trochanter of unspecified femur, initial encounter for closed fracture: Secondary | ICD-10-CM | POA: Diagnosis present

## 2020-06-10 DIAGNOSIS — Z90710 Acquired absence of both cervix and uterus: Secondary | ICD-10-CM

## 2020-06-10 DIAGNOSIS — Z8249 Family history of ischemic heart disease and other diseases of the circulatory system: Secondary | ICD-10-CM

## 2020-06-10 DIAGNOSIS — N179 Acute kidney failure, unspecified: Secondary | ICD-10-CM | POA: Diagnosis present

## 2020-06-10 DIAGNOSIS — W1809XA Striking against other object with subsequent fall, initial encounter: Secondary | ICD-10-CM | POA: Diagnosis present

## 2020-06-10 DIAGNOSIS — E8889 Other specified metabolic disorders: Secondary | ICD-10-CM | POA: Diagnosis present

## 2020-06-10 DIAGNOSIS — S72102A Unspecified trochanteric fracture of left femur, initial encounter for closed fracture: Secondary | ICD-10-CM

## 2020-06-10 DIAGNOSIS — E785 Hyperlipidemia, unspecified: Secondary | ICD-10-CM | POA: Diagnosis present

## 2020-06-10 DIAGNOSIS — I10 Essential (primary) hypertension: Secondary | ICD-10-CM | POA: Diagnosis present

## 2020-06-10 DIAGNOSIS — Z96653 Presence of artificial knee joint, bilateral: Secondary | ICD-10-CM | POA: Diagnosis present

## 2020-06-10 DIAGNOSIS — Z853 Personal history of malignant neoplasm of breast: Secondary | ICD-10-CM

## 2020-06-10 DIAGNOSIS — S72112A Displaced fracture of greater trochanter of left femur, initial encounter for closed fracture: Secondary | ICD-10-CM | POA: Diagnosis present

## 2020-06-10 DIAGNOSIS — K219 Gastro-esophageal reflux disease without esophagitis: Secondary | ICD-10-CM | POA: Diagnosis present

## 2020-06-10 DIAGNOSIS — Z79899 Other long term (current) drug therapy: Secondary | ICD-10-CM

## 2020-06-10 DIAGNOSIS — D62 Acute posthemorrhagic anemia: Secondary | ICD-10-CM | POA: Diagnosis not present

## 2020-06-10 DIAGNOSIS — F419 Anxiety disorder, unspecified: Secondary | ICD-10-CM | POA: Diagnosis present

## 2020-06-10 DIAGNOSIS — J449 Chronic obstructive pulmonary disease, unspecified: Secondary | ICD-10-CM | POA: Diagnosis present

## 2020-06-10 DIAGNOSIS — Z7951 Long term (current) use of inhaled steroids: Secondary | ICD-10-CM

## 2020-06-10 DIAGNOSIS — M810 Age-related osteoporosis without current pathological fracture: Secondary | ICD-10-CM | POA: Diagnosis present

## 2020-06-10 DIAGNOSIS — E875 Hyperkalemia: Secondary | ICD-10-CM | POA: Diagnosis present

## 2020-06-10 DIAGNOSIS — Z419 Encounter for procedure for purposes other than remedying health state, unspecified: Secondary | ICD-10-CM

## 2020-06-10 DIAGNOSIS — Z8781 Personal history of (healed) traumatic fracture: Secondary | ICD-10-CM

## 2020-06-10 DIAGNOSIS — T148XXA Other injury of unspecified body region, initial encounter: Secondary | ICD-10-CM

## 2020-06-10 DIAGNOSIS — Z85118 Personal history of other malignant neoplasm of bronchus and lung: Secondary | ICD-10-CM

## 2020-06-10 DIAGNOSIS — Z7982 Long term (current) use of aspirin: Secondary | ICD-10-CM

## 2020-06-10 DIAGNOSIS — Z87891 Personal history of nicotine dependence: Secondary | ICD-10-CM

## 2020-06-10 DIAGNOSIS — R0602 Shortness of breath: Secondary | ICD-10-CM

## 2020-06-10 DIAGNOSIS — F32A Depression, unspecified: Secondary | ICD-10-CM | POA: Diagnosis present

## 2020-06-10 DIAGNOSIS — M25552 Pain in left hip: Secondary | ICD-10-CM | POA: Diagnosis not present

## 2020-06-10 LAB — CBC
HCT: 32 % — ABNORMAL LOW (ref 36.0–46.0)
Hemoglobin: 9.3 g/dL — ABNORMAL LOW (ref 12.0–15.0)
MCH: 25.2 pg — ABNORMAL LOW (ref 26.0–34.0)
MCHC: 29.1 g/dL — ABNORMAL LOW (ref 30.0–36.0)
MCV: 86.7 fL (ref 80.0–100.0)
Platelets: 301 10*3/uL (ref 150–400)
RBC: 3.69 MIL/uL — ABNORMAL LOW (ref 3.87–5.11)
RDW: 19.3 % — ABNORMAL HIGH (ref 11.5–15.5)
WBC: 11.7 10*3/uL — ABNORMAL HIGH (ref 4.0–10.5)
nRBC: 0 % (ref 0.0–0.2)

## 2020-06-10 LAB — BASIC METABOLIC PANEL
Anion gap: 12 (ref 5–15)
BUN: 22 mg/dL (ref 8–23)
CO2: 25 mmol/L (ref 22–32)
Calcium: 8.7 mg/dL — ABNORMAL LOW (ref 8.9–10.3)
Chloride: 104 mmol/L (ref 98–111)
Creatinine, Ser: 1.55 mg/dL — ABNORMAL HIGH (ref 0.44–1.00)
GFR, Estimated: 34 mL/min — ABNORMAL LOW (ref >60)
Glucose, Bld: 114 mg/dL — ABNORMAL HIGH (ref 70–99)
Potassium: 5.5 mmol/L — ABNORMAL HIGH (ref 3.5–5.1)
Sodium: 141 mmol/L (ref 135–145)

## 2020-06-10 IMAGING — CR DG WRIST COMPLETE 3+V*L*
4 series · 4 of 4 positions shown · non-contrast
Comparison: Left hand radiograph dated [DATE].

CLINICAL DATA: 79-year-old female with fall and left wrist pain.

EXAM:
LEFT WRIST - COMPLETE 3+ VIEW

[wrist pa]
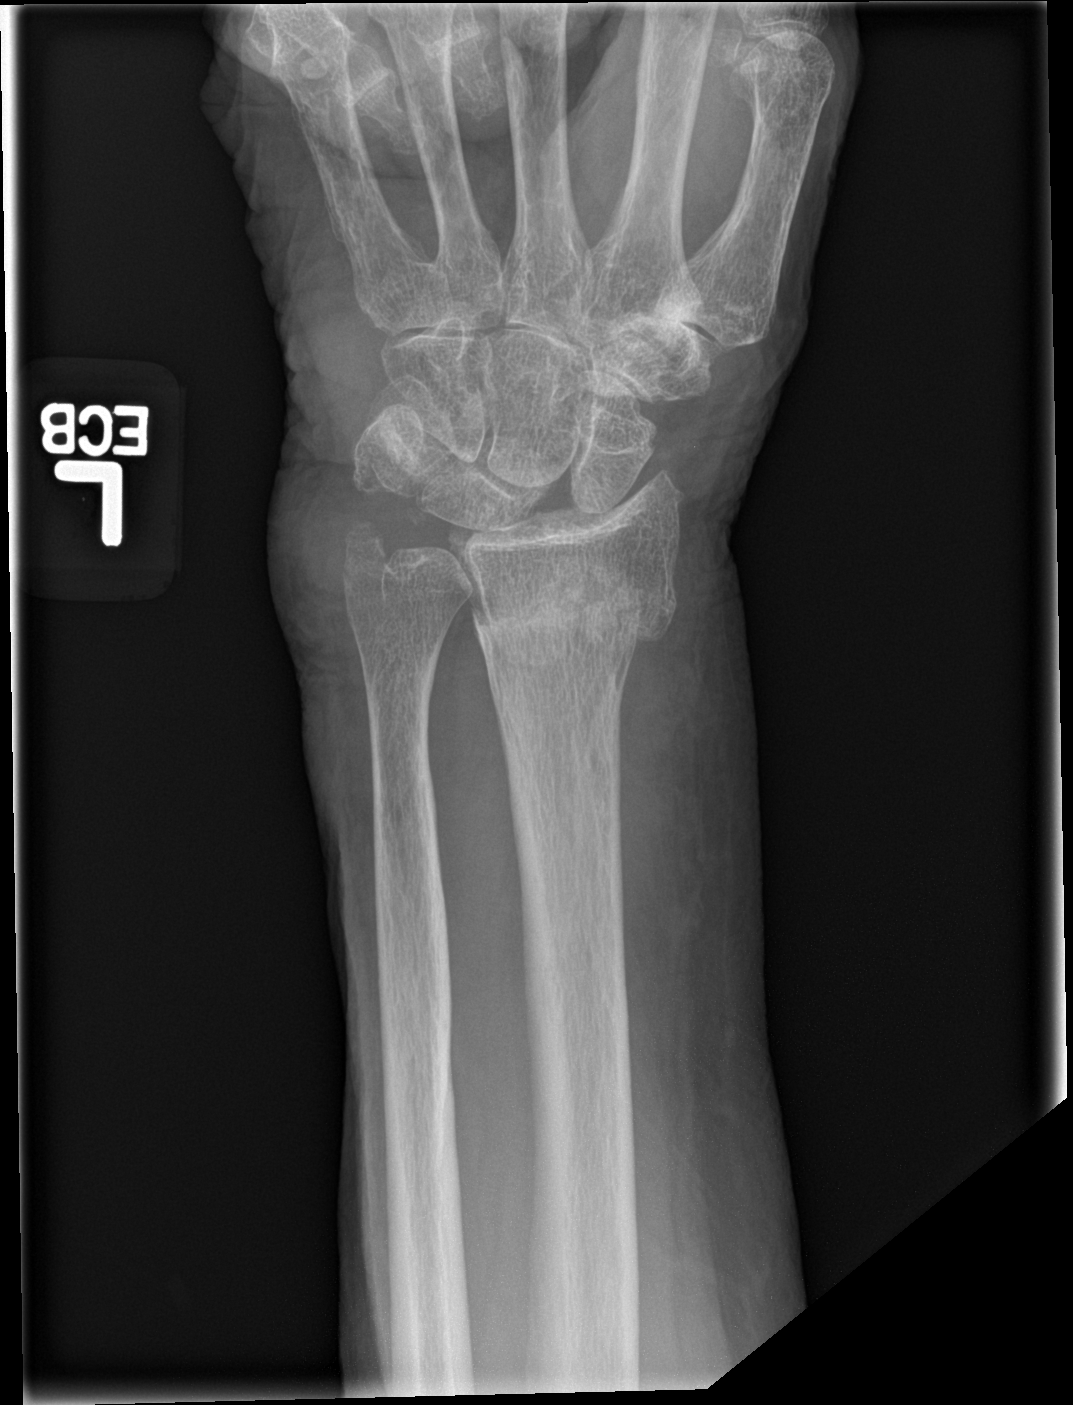

[wrist obl]
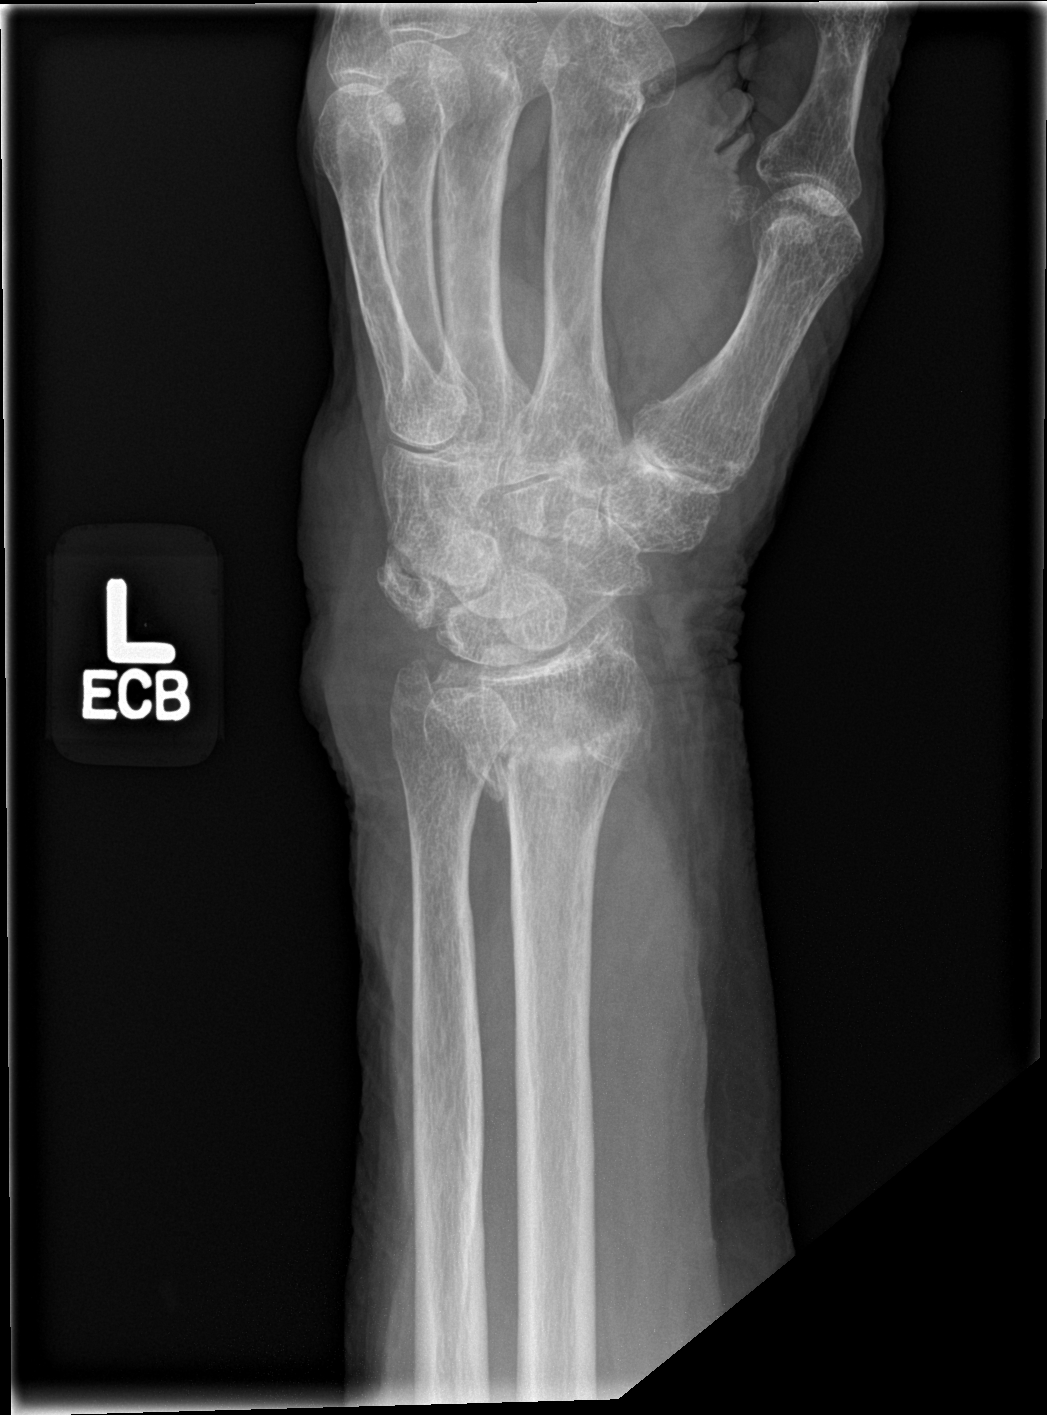

[wrist lat]
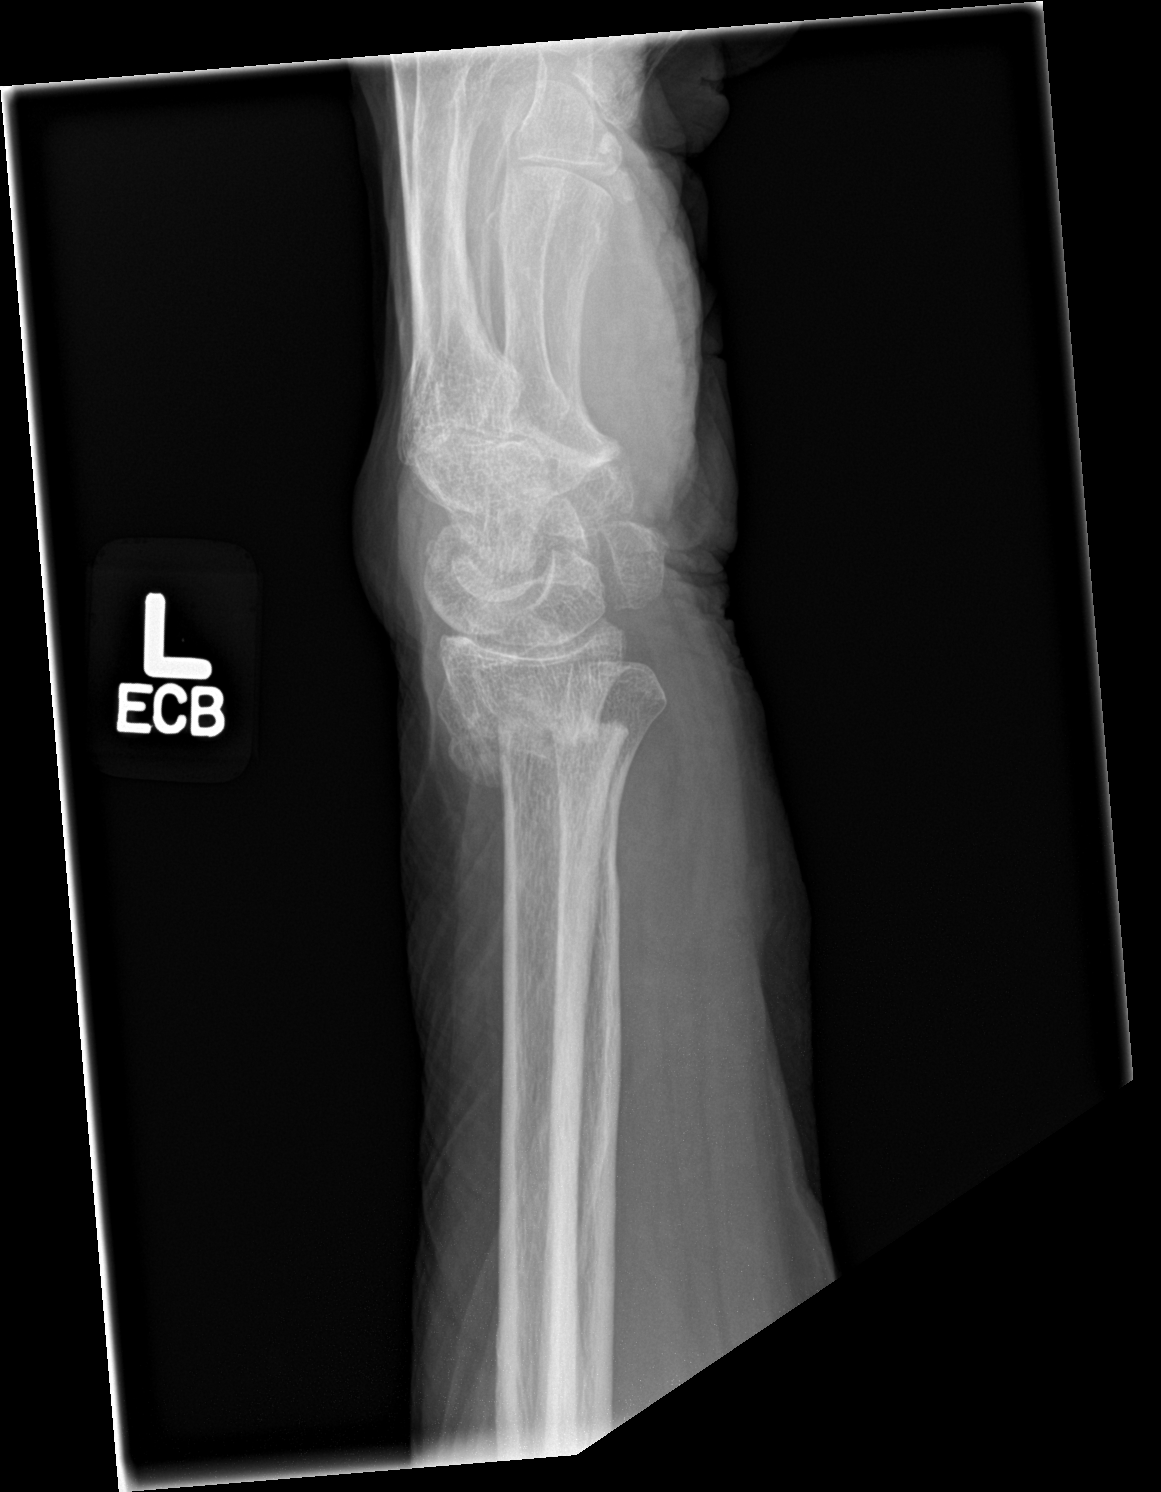

[wrist navicular]
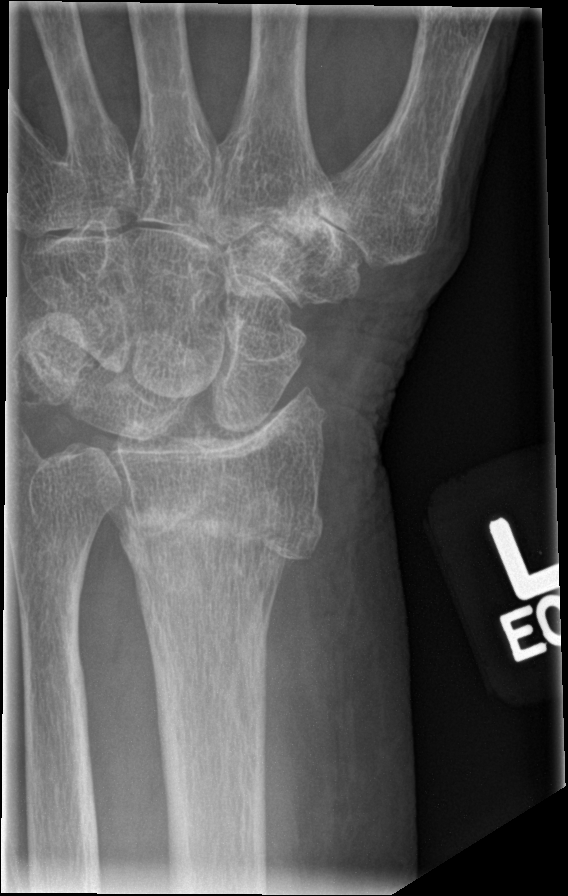

[4 of 4 positions shown; findings below may reference images not displayed]

FINDINGS: There is a mildly displaced and impacted fracture of the distal
radial metaphysis with approximately 4 mm dorsal displacement and
angulation of the distal fracture fragment. Probable nondisplaced
fracture of the ulnar styloid. There is no dislocation. There is
widening of the scapholunate distance. Advanced osteopenia. The soft
tissue swelling of the wrist.
IMPRESSION: Mildly displaced and angulated fracture of the distal radial
metaphysis and probable nondisplaced fracture of the ulnar styloid.

## 2020-06-10 IMAGING — CT CT HEAD W/O CM
3 series · 14 of 47 positions shown, 16 images · non-contrast
Comparison: [DATE]

CLINICAL DATA: Fall facial trauma

EXAM:
CT HEAD WITHOUT CONTRAST
TECHNIQUE: Contiguous axial images were obtained from the base of the skull
through the vertex without intravenous contrast.

[Series 3: head 5.0 h30s · axial · 0.43mm/px · z∈[-119,+11]mm · 8 of 32 slices shown, 10 images]
[im 3/32  brain]
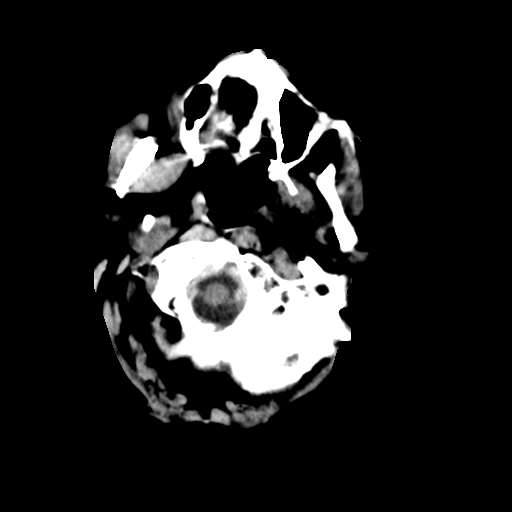
[im 3/32  bone]
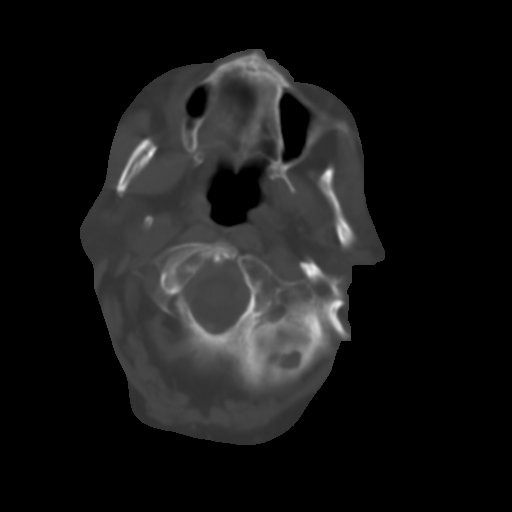
[im 7/32  brain]
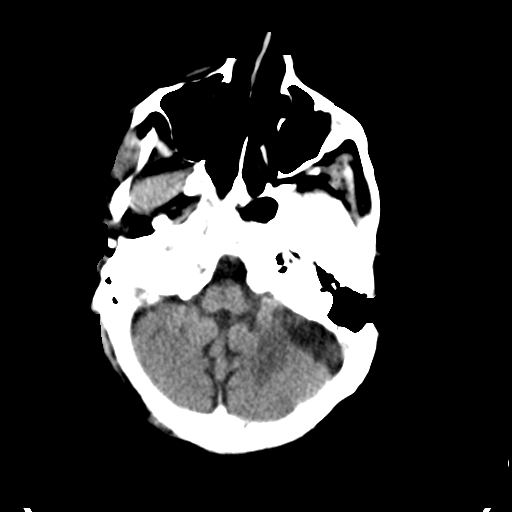
[im 10/32  brain]
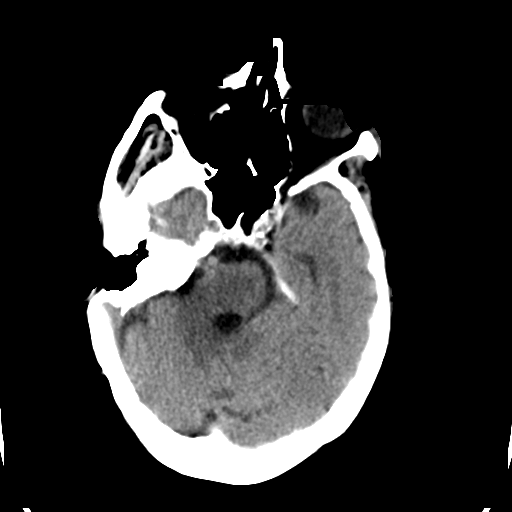
[im 14/32  brain]
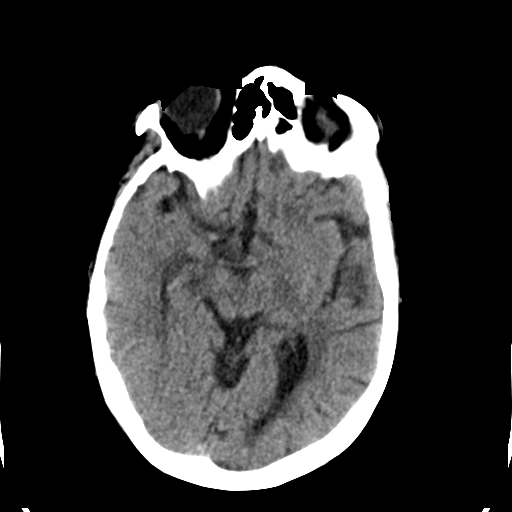
[im 18/32  brain]
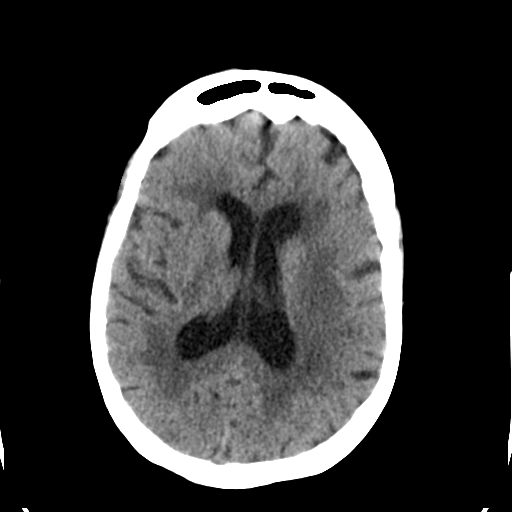
[im 18/32  bone]
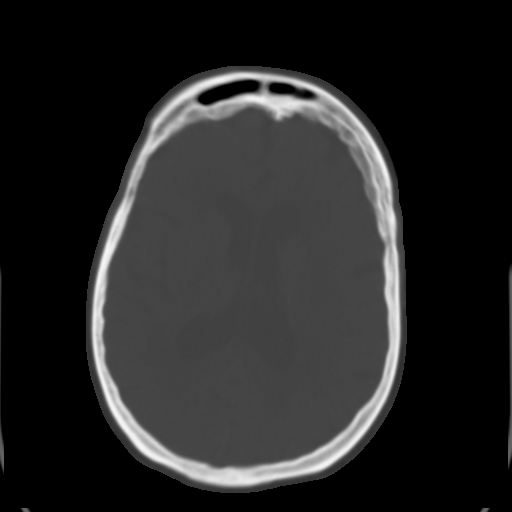
[im 22/32  brain]
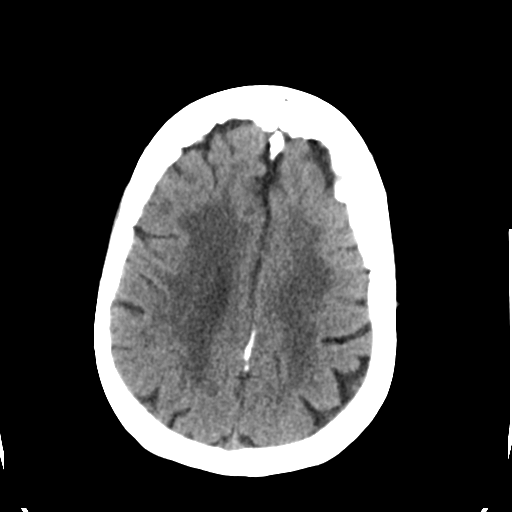
[im 25/32  brain]
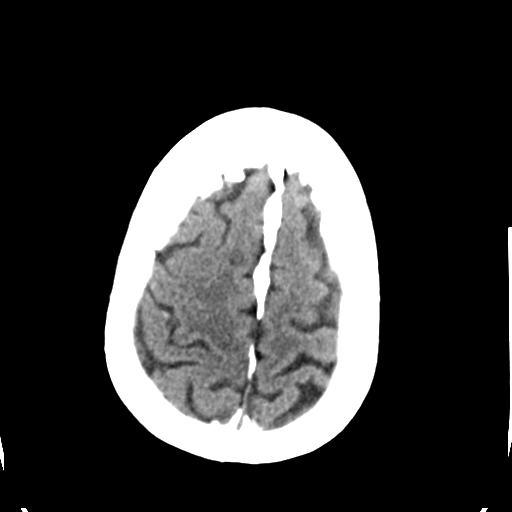
[im 29/32  brain]
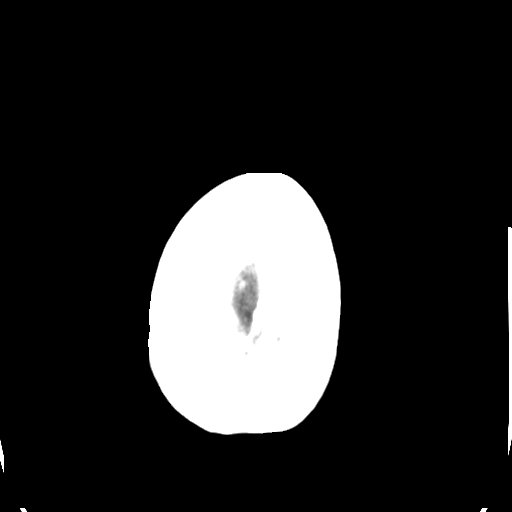

[Series 5: head 3.0 mpr cor · coronal · 0.31mm/px · 3 of 77 slices shown]
[im 26/77  brain]
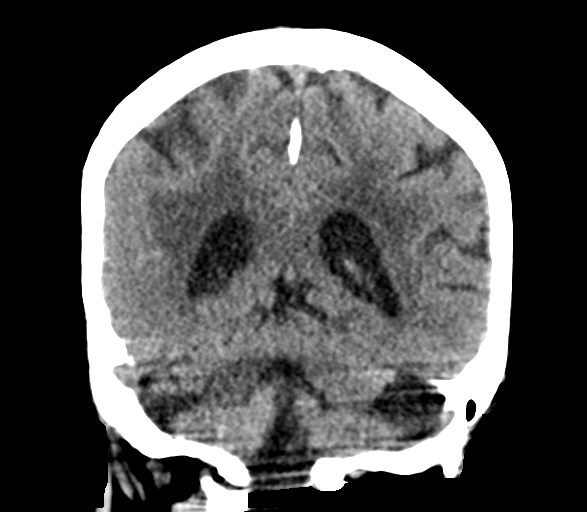
[im 34/77  brain]
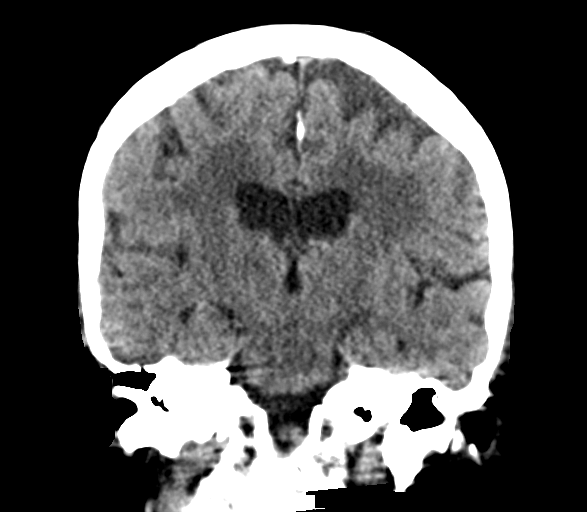
[im 43/77  brain]
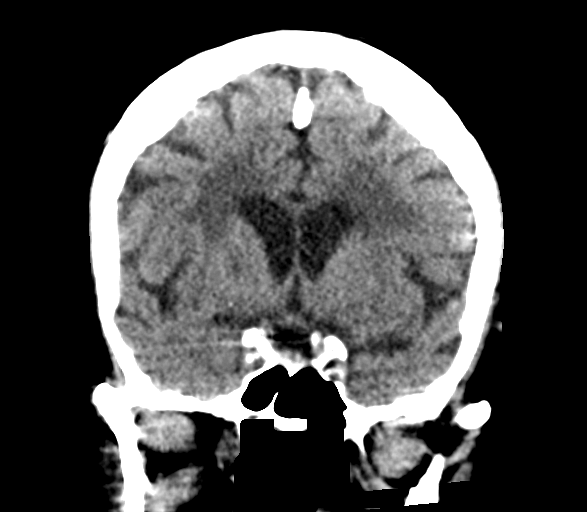

[Series 6: head 3.0 mpr sag · sagittal · 0.31mm/px · 3 of 66 slices shown]
[im 25/66  brain]
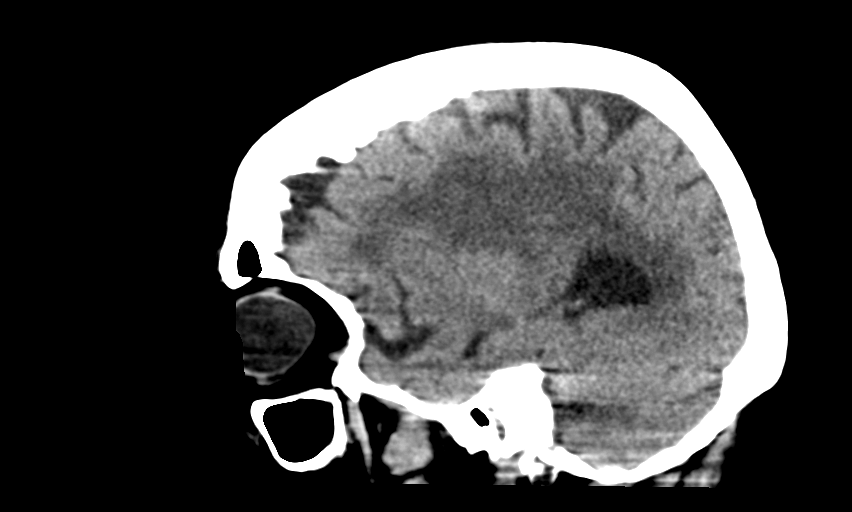
[im 33/66  brain]
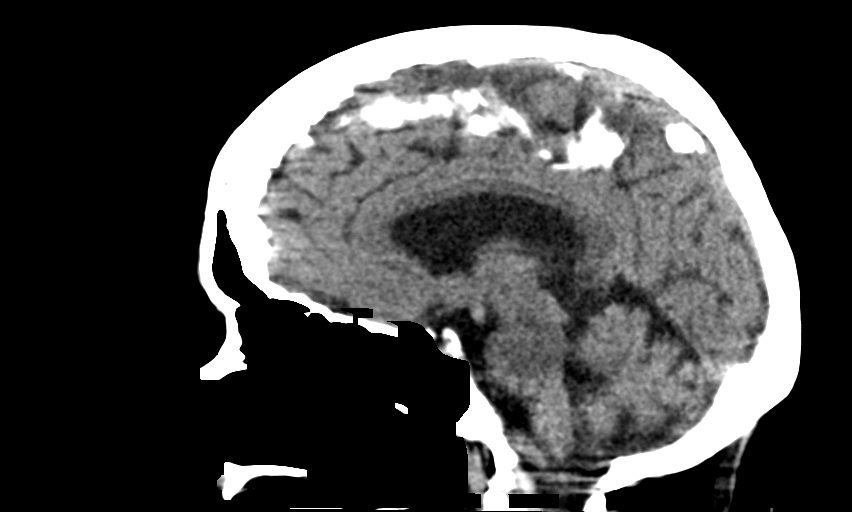
[im 41/66  brain]
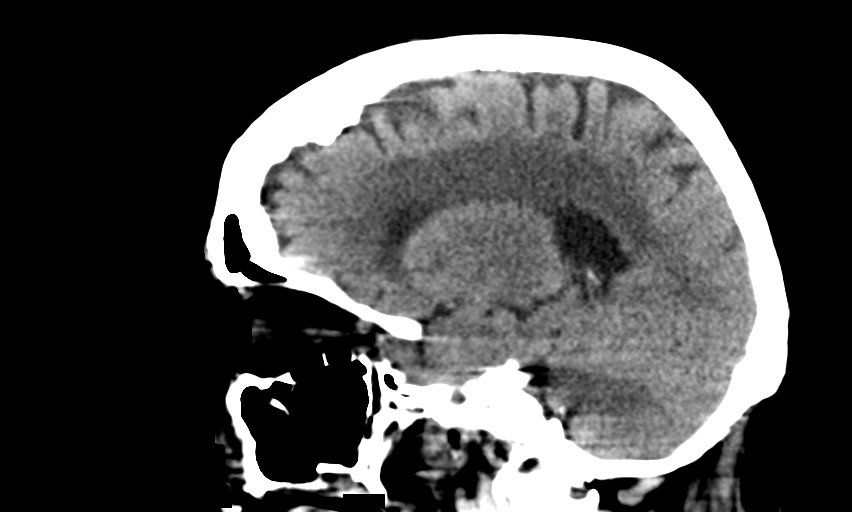

[14 of 47 positions shown; findings below may reference images not displayed]

FINDINGS: Brain: No evidence of acute territorial infarction, hemorrhage,
hydrocephalus,extra-axial collection or mass lesion/mass effect.
There is dilatation the ventricles and sulci consistent with
age-related atrophy. Low-attenuation changes in the deep white
matter consistent with small vessel ischemia.

Vascular: No hyperdense vessel or unexpected calcification.

Skull: The skull is intact. No fracture or focal lesion identified.

Sinuses/Orbits: The visualized paranasal sinuses and mastoid air
cells are clear. The orbits and globes intact.

Other: None
IMPRESSION: No acute intracranial abnormality.

Findings consistent with age related atrophy and chronic small
vessel ischemia

## 2020-06-10 IMAGING — CR DG HIP (WITH OR WITHOUT PELVIS) 2-3V*L*
3 series · 3 of 3 positions shown · non-contrast
Comparison: [DATE]

CLINICAL DATA: Fall left hip pain

EXAM:
DG HIP (WITH OR WITHOUT PELVIS) 2-3V LEFT

[pelvis ap]
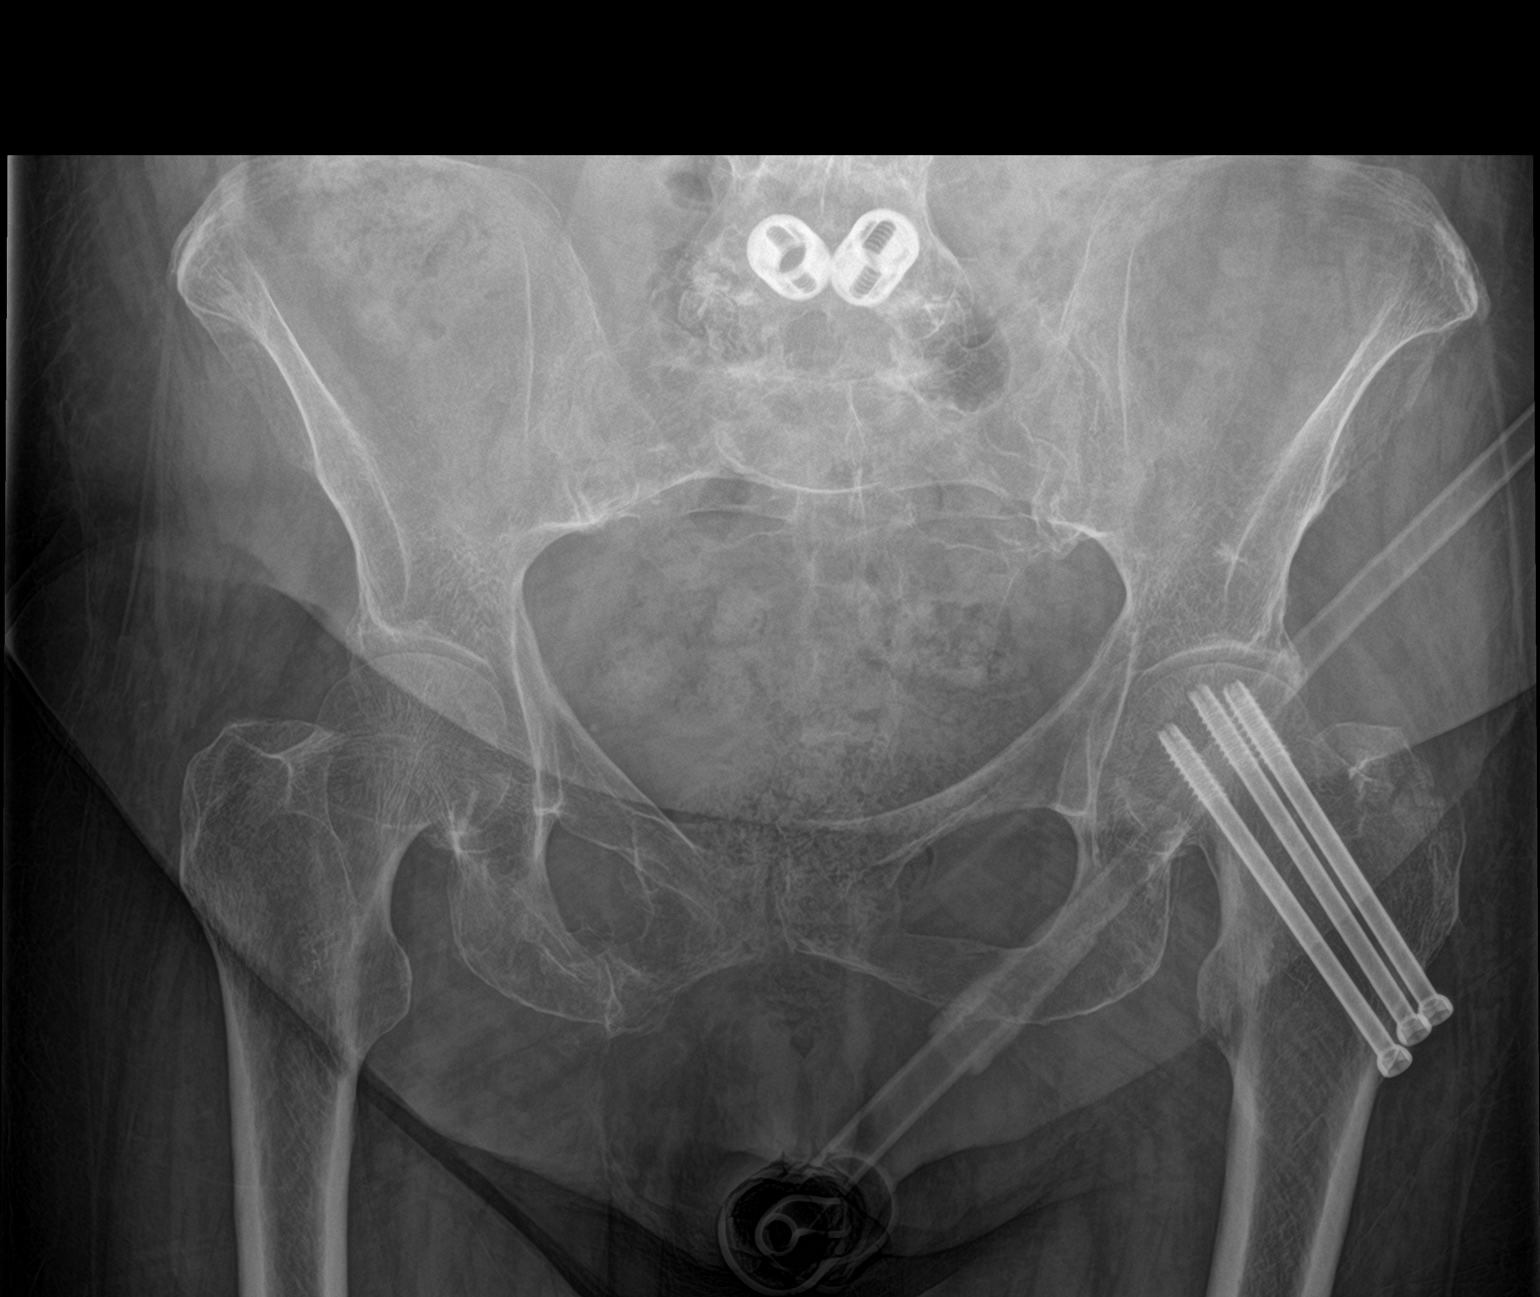

[hip ap]
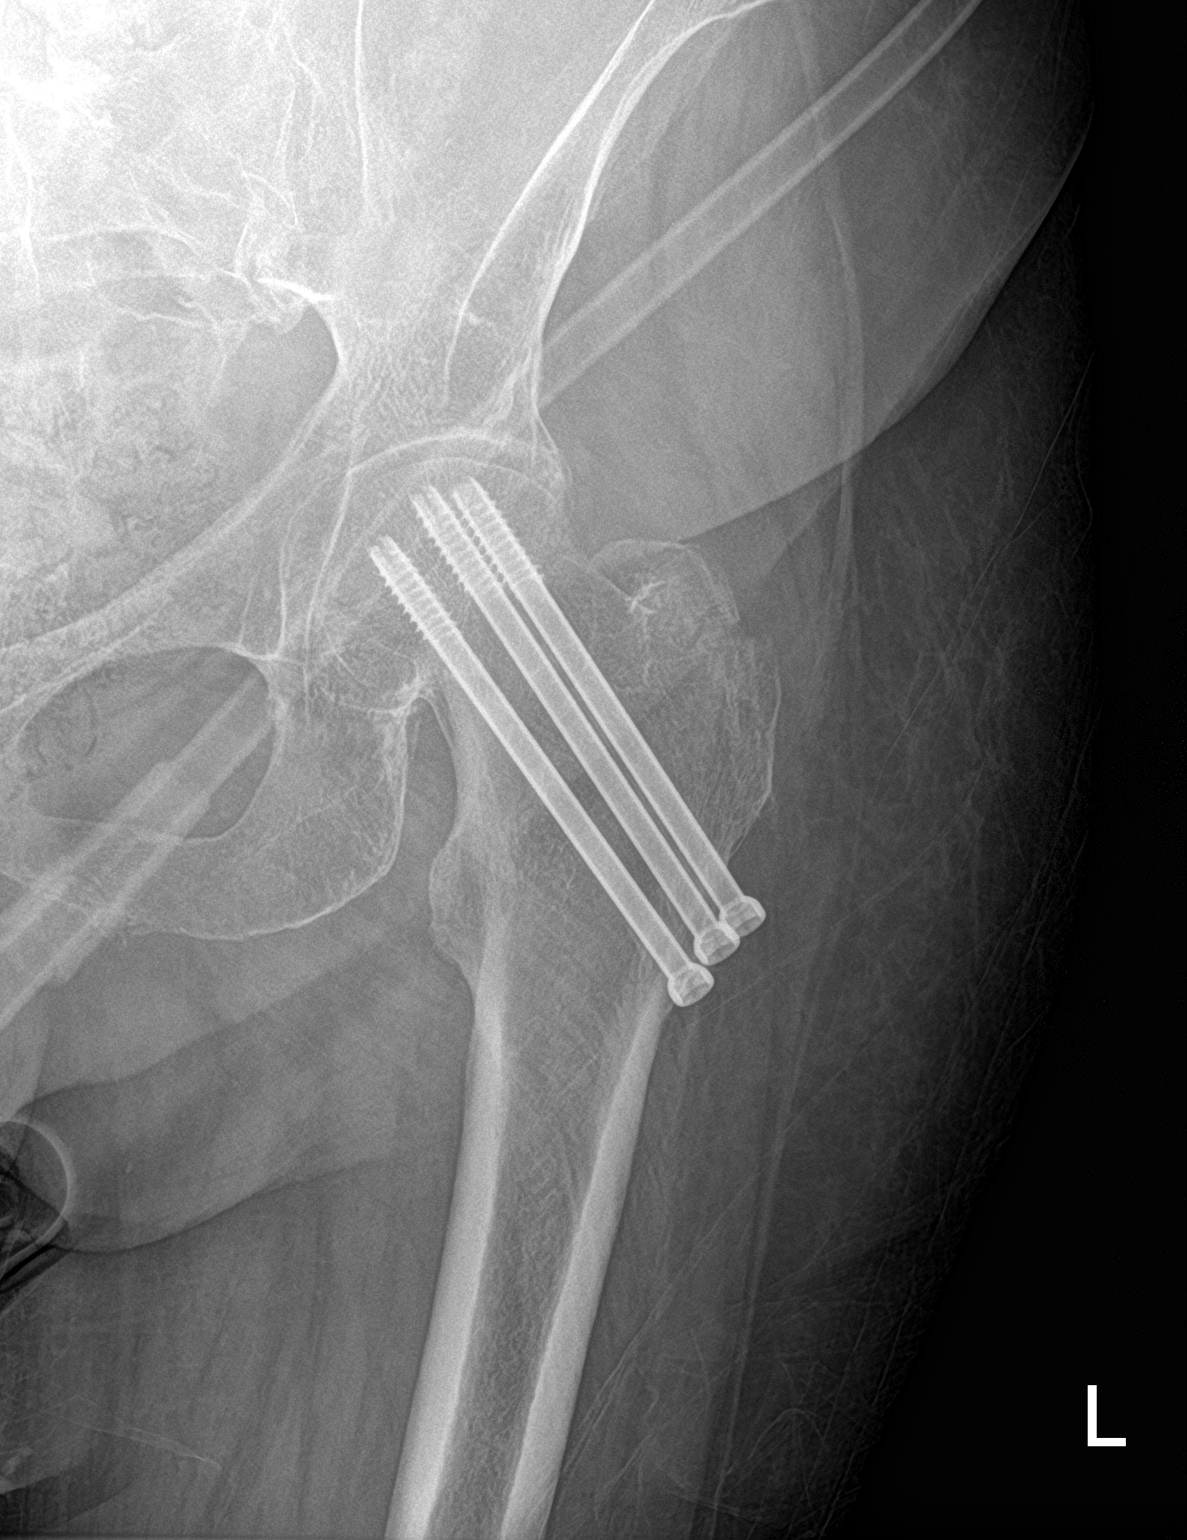

[hip lat]
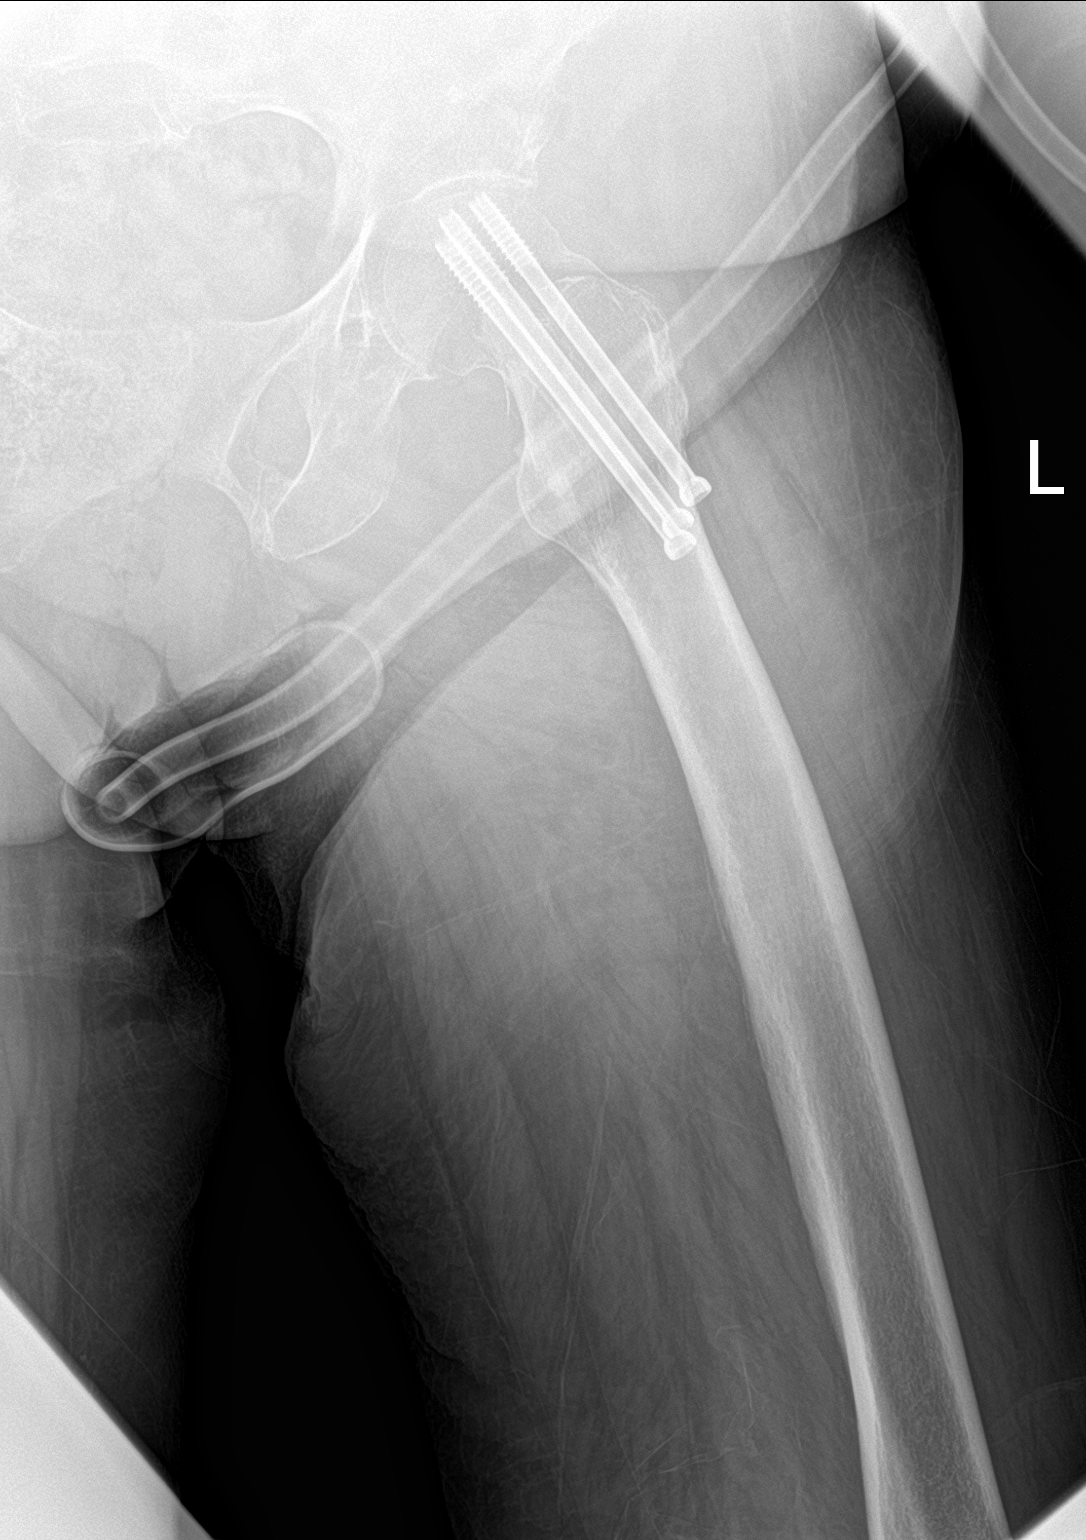

[3 of 3 positions shown; findings below may reference images not displayed]

FINDINGS: The patient is status post screw fixation of the proximal left
femur. There appears to be in acute intratrochanteric fracture with
mildly displaced fractures through the greater trochanter. Overlying
soft tissue swelling is seen. Healed fracture deformity seen at the
inferior right pubic rami. There is diffuse osteopenia.
IMPRESSION: Acute left intratrochanteric femur fracture

## 2020-06-10 MED ORDER — FENTANYL CITRATE (PF) 100 MCG/2ML IJ SOLN
50.0000 ug | Freq: Once | INTRAMUSCULAR | Status: AC
  Administered 2020-06-10: 50 ug via INTRAVENOUS
  Filled 2020-06-10: qty 2

## 2020-06-10 NOTE — ED Notes (Signed)
[phone] Daughter would like an update Tammy Hunt

## 2020-06-10 NOTE — ED Provider Notes (Signed)
[facility] Emergency Department Provider Note MRN:  [id number]  Arrival date & time: 06/11/20     Chief Complaint   Fall   History of Present Illness   [name] is a 79 y.o. year-old female with a history of lung cancer, COPD presenting to the ED with chief complaint of fall.  Patient tripped on a toy that was left out by a grandchild.  She fell and struck her head against the ground, she is endorsing left wrist pain and left hip pain.  Pain is moderate to severe, worse with motion or palpation to the areas.  Patient takes a full dose aspirin daily.  No neck pain, no back pain, no chest pain or shortness of breath, no abdominal pain.  Review of Systems  A complete 10 system review of systems was obtained and all systems are negative except as noted in the HPI and PMH.   Patient's Health History    Past Medical History:  Diagnosis Date  . Adenocarcinoma of right lung, stage 1 ([place]) 07/28/2016  . Anxiety   . Asthma   . Cancer ([place])    uterine  . COPD (chronic obstructive pulmonary disease) ([place])   . Coronary artery disease    mild nonobstructive by 2019 cath  . Cyst    left side of neck  . Depression   . GERD (gastroesophageal reflux disease)   . Heart murmur   . Hyperlipidemia   . Hypertension   . Low back pain   . Osteoarthritis   . Osteoporosis   . Takotsubo cardiomyopathy    EF recovered to 50-55% by 01/23/18 echo    Past Surgical History:  Procedure Laterality Date  . ANKLE SURGERY Right    horse accident  . APPENDECTOMY    . BACK SURGERY    . CHOLECYSTECTOMY    . EYE SURGERY     lense implant  . FRACTURE SURGERY    . HIP SURGERY  01/2010   Left Hip   . INNER EAR SURGERY Bilateral 1970   related to severe ear infections  . KNEE SURGERY     Right knee  . LEFT HEART CATH AND CORONARY ANGIOGRAPHY N/A 12/11/2017   Procedure: LEFT HEART CATH AND CORONARY ANGIOGRAPHY;  Surgeon: Jettie Booze, MD;  Location: [place] [facility];   Service: Cardiovascular;  Laterality: N/A;  . LOBECTOMY Right 05/09/2016   Procedure: RIGHT UPPER LOBECTOMY;  Surgeon: [name], MD;  Location: [place];  Service: Thoracic;  Laterality: Right;  . MASS EXCISION  08/25/2011   Procedure: EXCISION MASS;  Surgeon: Harl Bowie, MD;  Location: [place];  Service: General;  Laterality: N/A;  excision left neck mass  . TOTAL ABDOMINAL HYSTERECTOMY    . TOTAL KNEE ARTHROPLASTY Right 05/27/2019   Procedure: TOTAL KNEE ARTHROPLASTY;  Surgeon: Vickey Huger, MD;  Location: [facility];  Service: Orthopedics;  Laterality: Right;  75 mins needed for length of case  . TOTAL KNEE ARTHROPLASTY Left 12/30/2019   Procedure: TOTAL KNEE ARTHROPLASTY;  Surgeon: Vickey Huger, MD;  Location: [facility];  Service: Orthopedics;  Laterality: Left;  Marland Kitchen VIDEO ASSISTED THORACOSCOPY (VATS)/WEDGE RESECTION Right 05/09/2016   Procedure: VIDEO ASSISTED THORACOSCOPY (VATS)/WEDGE RESECTION;  Surgeon: [name], MD;  Location: [place];  Service: Thoracic;  Laterality: Right;    Family History  Problem Relation Age of Onset  . Heart disease Mother   . Heart disease Father   . Cancer Sister        #  1, breast  . Heart disease Brother        had CABG    Social History   Socioeconomic History  . Marital status: Widowed    Spouse name: Not on file  . Number of children: Not on file  . Years of education: Not on file  . Highest education level: Not on file  Occupational History  . Not on file  Tobacco Use  . Smoking status: Former Smoker    Packs/day: 2.00    Years: 51.00    Pack years: 102.00    Types: Cigarettes    Quit date: 05/04/2012    Years since quitting: 8.1  . Smokeless tobacco: Never Used  Vaping Use  . Vaping Use: Former  . Quit date: 12/01/2017  Substance and Sexual Activity  . Alcohol use: No    Alcohol/week: 0.0 standard drinks  . Drug use: No  . Sexual activity: Yes    Birth control/protection: Other-see comments   Other Topics Concern  . Not on file  Social History Narrative   Single, lives alone with 2 dogs   3 children   Retired: several careers included Regulatory affairs officer, diners, truck stops   Works now on the weekends at [employer]:   . Difficulty of Paying Living Expenses: Not on file  Food Insecurity:   . Worried About Charity fundraiser in the Last Year: Not on file  . Ran Out of Food in the Last Year: Not on file  Transportation Needs:   . Lack of Transportation (Medical): Not on file  . Lack of Transportation (Non-Medical): Not on file  Physical Activity:   . Days of Exercise per Week: Not on file  . Minutes of Exercise per Session: Not on file  Stress:   . Feeling of Stress : Not on file  Social Connections:   . Frequency of Communication with Friends and Family: Not on file  . Frequency of Social Gatherings with Friends and Family: Not on file  . Attends Religious Services: Not on file  . Active Member of Clubs or Organizations: Not on file  . Attends Archivist Meetings: Not on file  . Marital Status: Not on file  Intimate Partner Violence:   . Fear of Current or Ex-Partner: Not on file  . Emotionally Abused: Not on file  . Physically Abused: Not on file  . Sexually Abused: Not on file     Physical Exam   Vitals:   06/10/20 2300 06/10/20 2315  BP: (!) 139/93 (!) 144/74  Pulse: 87 83  Resp: 19 19  Temp:    SpO2: 96% 97%    CONSTITUTIONAL: Chronically ill-appearing, NAD NEURO:  Alert and oriented x 3, no focal deficits EYES:  eyes equal and reactive ENT/NECK:  no LAD, no JVD CARDIO: Regular rate, well-perfused, normal S1 and S2 PULM:  CTAB no wheezing or rhonchi GI/GU:  normal bowel sounds, non-distended, non-tender MSK/SPINE: Mild deformity to the left wrist with tenderness to palpation, neurovascularly intact distally.  There is reduced range of motion of the left hip due to discomfort and it is  tender to palpation.  Neurovascularly intact distally. SKIN: Bruising to the left periorbital region with small abrasion PSYCH:  Appropriate speech and behavior  *Additional and/or pertinent findings included in MDM below  Diagnostic and Interventional Summary    EKG Interpretation  Date/Time:    Ventricular Rate:    PR Interval:  QRS Duration:   QT Interval:    QTC Calculation:   R Axis:     Text Interpretation:        Labs Reviewed  CBC - Abnormal; Notable for the following components:      Result Value   WBC 11.7 (*)    RBC 3.69 (*)    Hemoglobin 9.3 (*)    HCT 32.0 (*)    MCH 25.2 (*)    MCHC 29.1 (*)    RDW 19.3 (*)    All other components within normal limits  BASIC METABOLIC PANEL - Abnormal; Notable for the following components:   Potassium 5.5 (*)    Glucose, Bld 114 (*)    Creatinine, Ser 1.55 (*)    Calcium 8.7 (*)    GFR, Estimated 34 (*)    All other components within normal limits  RESPIRATORY PANEL BY RT PCR (FLU A&B, COVID)    CT HEAD WO CONTRAST  Final Result    DG Hip Unilat W or Wo Pelvis 2-3 Views Left  Final Result    DG Wrist Complete Left  Final Result      Medications  fentaNYL (SUBLIMAZE) injection 50 mcg (50 mcg Intravenous Given 06/10/20 2134)     Procedures  /  Critical Care Procedures  ED Course and Medical Decision Making  I have reviewed the triage vital signs, the nursing notes, and pertinent available records from the EMR.  Listed above are laboratory and imaging tests that I personally ordered, reviewed, and interpreted and then considered in my medical decision making (see below for details).  Mechanical fall, awaiting imaging of the head, hip.  X-ray confirms fracture of the wrist which we will splint and have her follow-up with the orthopedic surgeons.     XR reveals hip fx, admitted to medicine for further care.  Barth Kirks. Sedonia Small, MD [place] [email or web address]  Final Clinical Impressions(s) / ED Diagnoses     ICD-10-CM   1. Closed fracture of trochanter of left femur, initial encounter [facility])  S72.102A     ED Discharge Orders    None       Discharge Instructions Discussed with and Provided to Patient:   Discharge Instructions   None       Maudie Flakes, MD 06/11/20 [phone]

## 2020-06-10 NOTE — ED Triage Notes (Addendum)
Patient BIB [facility] after a fall, denies LOC. Laceration on forehead. Patient states she fell because she slipped on a toy shark on the floor.   EMS vitals 96% room air HR 76 BP 146/92

## 2020-06-11 ENCOUNTER — Inpatient Hospital Stay (HOSPITAL_COMMUNITY): Payer: Medicare Other

## 2020-06-11 DIAGNOSIS — S52552A Other extraarticular fracture of lower end of left radius, initial encounter for closed fracture: Secondary | ICD-10-CM | POA: Diagnosis present

## 2020-06-11 DIAGNOSIS — Z8542 Personal history of malignant neoplasm of other parts of uterus: Secondary | ICD-10-CM | POA: Diagnosis not present

## 2020-06-11 DIAGNOSIS — Z85118 Personal history of other malignant neoplasm of bronchus and lung: Secondary | ICD-10-CM | POA: Diagnosis not present

## 2020-06-11 DIAGNOSIS — M25552 Pain in left hip: Secondary | ICD-10-CM | POA: Diagnosis present

## 2020-06-11 DIAGNOSIS — S72112A Displaced fracture of greater trochanter of left femur, initial encounter for closed fracture: Secondary | ICD-10-CM | POA: Diagnosis present

## 2020-06-11 DIAGNOSIS — I2510 Atherosclerotic heart disease of native coronary artery without angina pectoris: Secondary | ICD-10-CM | POA: Diagnosis present

## 2020-06-11 DIAGNOSIS — J449 Chronic obstructive pulmonary disease, unspecified: Secondary | ICD-10-CM | POA: Diagnosis present

## 2020-06-11 DIAGNOSIS — Z853 Personal history of malignant neoplasm of breast: Secondary | ICD-10-CM | POA: Diagnosis not present

## 2020-06-11 DIAGNOSIS — D62 Acute posthemorrhagic anemia: Secondary | ICD-10-CM | POA: Diagnosis not present

## 2020-06-11 DIAGNOSIS — G40909 Epilepsy, unspecified, not intractable, without status epilepticus: Secondary | ICD-10-CM | POA: Diagnosis present

## 2020-06-11 DIAGNOSIS — Z8781 Personal history of (healed) traumatic fracture: Secondary | ICD-10-CM | POA: Diagnosis not present

## 2020-06-11 DIAGNOSIS — S52612A Displaced fracture of left ulna styloid process, initial encounter for closed fracture: Secondary | ICD-10-CM | POA: Diagnosis present

## 2020-06-11 DIAGNOSIS — E8889 Other specified metabolic disorders: Secondary | ICD-10-CM | POA: Diagnosis present

## 2020-06-11 DIAGNOSIS — E559 Vitamin D deficiency, unspecified: Secondary | ICD-10-CM | POA: Diagnosis present

## 2020-06-11 DIAGNOSIS — I10 Essential (primary) hypertension: Secondary | ICD-10-CM | POA: Diagnosis present

## 2020-06-11 DIAGNOSIS — F0390 Unspecified dementia without behavioral disturbance: Secondary | ICD-10-CM | POA: Diagnosis present

## 2020-06-11 DIAGNOSIS — K219 Gastro-esophageal reflux disease without esophagitis: Secondary | ICD-10-CM | POA: Diagnosis present

## 2020-06-11 DIAGNOSIS — F32A Depression, unspecified: Secondary | ICD-10-CM | POA: Diagnosis present

## 2020-06-11 DIAGNOSIS — S72102A Unspecified trochanteric fracture of left femur, initial encounter for closed fracture: Secondary | ICD-10-CM | POA: Diagnosis not present

## 2020-06-11 DIAGNOSIS — Z96653 Presence of artificial knee joint, bilateral: Secondary | ICD-10-CM | POA: Diagnosis present

## 2020-06-11 DIAGNOSIS — M810 Age-related osteoporosis without current pathological fracture: Secondary | ICD-10-CM | POA: Diagnosis present

## 2020-06-11 DIAGNOSIS — S72113A Displaced fracture of greater trochanter of unspecified femur, initial encounter for closed fracture: Secondary | ICD-10-CM

## 2020-06-11 DIAGNOSIS — F419 Anxiety disorder, unspecified: Secondary | ICD-10-CM | POA: Diagnosis present

## 2020-06-11 DIAGNOSIS — E785 Hyperlipidemia, unspecified: Secondary | ICD-10-CM | POA: Diagnosis present

## 2020-06-11 DIAGNOSIS — Y92009 Unspecified place in unspecified non-institutional (private) residence as the place of occurrence of the external cause: Secondary | ICD-10-CM | POA: Diagnosis not present

## 2020-06-11 DIAGNOSIS — E875 Hyperkalemia: Secondary | ICD-10-CM | POA: Diagnosis present

## 2020-06-11 DIAGNOSIS — M1990 Unspecified osteoarthritis, unspecified site: Secondary | ICD-10-CM | POA: Diagnosis present

## 2020-06-11 DIAGNOSIS — N179 Acute kidney failure, unspecified: Secondary | ICD-10-CM | POA: Diagnosis present

## 2020-06-11 DIAGNOSIS — W1809XA Striking against other object with subsequent fall, initial encounter: Secondary | ICD-10-CM | POA: Diagnosis present

## 2020-06-11 DIAGNOSIS — Z20822 Contact with and (suspected) exposure to covid-19: Secondary | ICD-10-CM | POA: Diagnosis present

## 2020-06-11 HISTORY — DX: Displaced fracture of greater trochanter of unspecified femur, initial encounter for closed fracture: S72.113A

## 2020-06-11 LAB — COMPREHENSIVE METABOLIC PANEL
ALT: 10 U/L (ref 0–44)
AST: 18 U/L (ref 15–41)
Albumin: 2.9 g/dL — ABNORMAL LOW (ref 3.5–5.0)
Alkaline Phosphatase: 45 U/L (ref 38–126)
Anion gap: 9 (ref 5–15)
BUN: 22 mg/dL (ref 8–23)
CO2: 24 mmol/L (ref 22–32)
Calcium: 8.4 mg/dL — ABNORMAL LOW (ref 8.9–10.3)
Chloride: 108 mmol/L (ref 98–111)
Creatinine, Ser: 1.49 mg/dL — ABNORMAL HIGH (ref 0.44–1.00)
GFR, Estimated: 36 mL/min — ABNORMAL LOW (ref >60)
Glucose, Bld: 99 mg/dL (ref 70–99)
Potassium: 4.9 mmol/L (ref 3.5–5.1)
Sodium: 141 mmol/L (ref 135–145)
Total Bilirubin: 0.5 mg/dL (ref 0.3–1.2)
Total Protein: 6.3 g/dL — ABNORMAL LOW (ref 6.5–8.1)

## 2020-06-11 LAB — CBC WITH DIFFERENTIAL/PLATELET
Abs Immature Granulocytes: 0.04 10*3/uL (ref 0.00–0.07)
Basophils Absolute: 0 10*3/uL (ref 0.0–0.1)
Basophils Relative: 1 %
Eosinophils Absolute: 0.1 10*3/uL (ref 0.0–0.5)
Eosinophils Relative: 2 %
HCT: 26.6 % — ABNORMAL LOW (ref 36.0–46.0)
Hemoglobin: 7.8 g/dL — ABNORMAL LOW (ref 12.0–15.0)
Immature Granulocytes: 1 %
Lymphocytes Relative: 18 %
Lymphs Abs: 1.2 10*3/uL (ref 0.7–4.0)
MCH: 24.9 pg — ABNORMAL LOW (ref 26.0–34.0)
MCHC: 29.3 g/dL — ABNORMAL LOW (ref 30.0–36.0)
MCV: 85 fL (ref 80.0–100.0)
Monocytes Absolute: 0.5 10*3/uL (ref 0.1–1.0)
Monocytes Relative: 8 %
Neutro Abs: 4.6 10*3/uL (ref 1.7–7.7)
Neutrophils Relative %: 70 %
Platelets: 245 10*3/uL (ref 150–400)
RBC: 3.13 MIL/uL — ABNORMAL LOW (ref 3.87–5.11)
RDW: 19.6 % — ABNORMAL HIGH (ref 11.5–15.5)
WBC: 6.5 10*3/uL (ref 4.0–10.5)
nRBC: 0 % (ref 0.0–0.2)

## 2020-06-11 LAB — CBC
HCT: 24.3 % — ABNORMAL LOW (ref 36.0–46.0)
Hemoglobin: 7 g/dL — ABNORMAL LOW (ref 12.0–15.0)
MCH: 25 pg — ABNORMAL LOW (ref 26.0–34.0)
MCHC: 28.8 g/dL — ABNORMAL LOW (ref 30.0–36.0)
MCV: 86.8 fL (ref 80.0–100.0)
Platelets: 216 10*3/uL (ref 150–400)
RBC: 2.8 MIL/uL — ABNORMAL LOW (ref 3.87–5.11)
RDW: 19.8 % — ABNORMAL HIGH (ref 11.5–15.5)
WBC: 5.8 10*3/uL (ref 4.0–10.5)
nRBC: 0 % (ref 0.0–0.2)

## 2020-06-11 LAB — PROTIME-INR
INR: 1.1 (ref 0.8–1.2)
Prothrombin Time: 13.5 seconds (ref 11.4–15.2)

## 2020-06-11 LAB — RESPIRATORY PANEL BY RT PCR (FLU A&B, COVID)
Influenza A by PCR: NEGATIVE
Influenza B by PCR: NEGATIVE
SARS Coronavirus 2 by RT PCR: NEGATIVE

## 2020-06-11 LAB — SURGICAL PCR SCREEN
MRSA, PCR: POSITIVE — AB
Staphylococcus aureus: POSITIVE — AB

## 2020-06-11 LAB — RETICULOCYTES
Immature Retic Fract: 25.5 % — ABNORMAL HIGH (ref 2.3–15.9)
RBC.: 2.8 MIL/uL — ABNORMAL LOW (ref 3.87–5.11)
Retic Count, Absolute: 67.8 10*3/uL (ref 19.0–186.0)
Retic Ct Pct: 2.4 % (ref 0.4–3.1)

## 2020-06-11 IMAGING — CT CT HIP*L* W/O CM
2 of 3 series · 15 of 42 positions shown, 18 images · non-contrast
Comparison: X-ray [DATE]

CLINICAL DATA: Evaluate left hip fracture

EXAM:
CT OF THE LEFT HIP WITHOUT CONTRAST
TECHNIQUE: Multidetector CT imaging of the left hip was performed according to
the standard protocol. Multiplanar CT image reconstructions were
also generated.

[Series 5: hip 2.0 (person_name) (person_name) · axial · 0.44mm/px · z∈[-288,-98]mm · 12 of 106 slices shown, 15 images]
[im 7/106  soft-tissue]
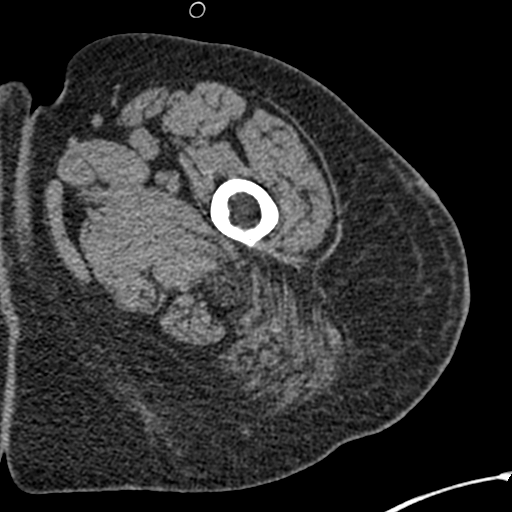
[im 7/106  bone]
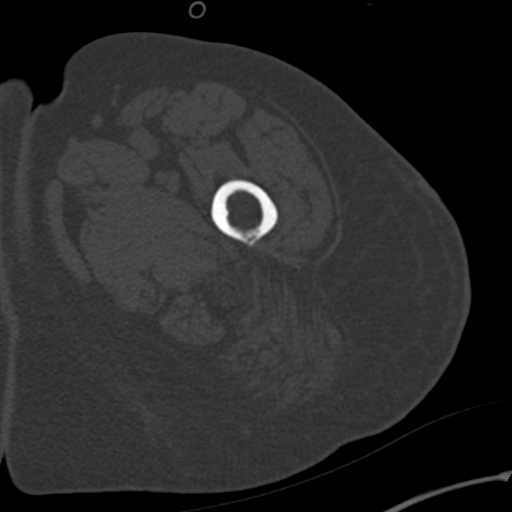
[im 21/106  soft-tissue]
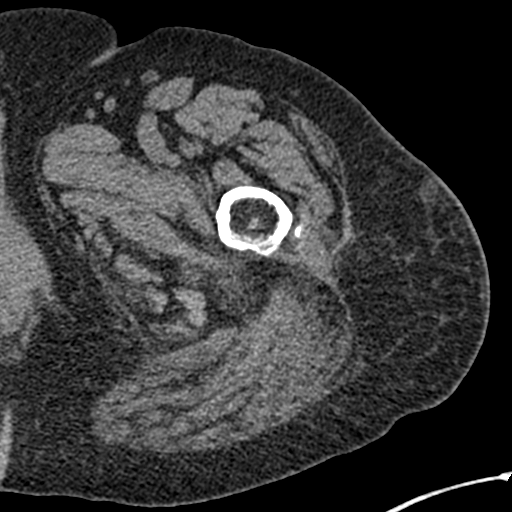
[im 31/106  soft-tissue]
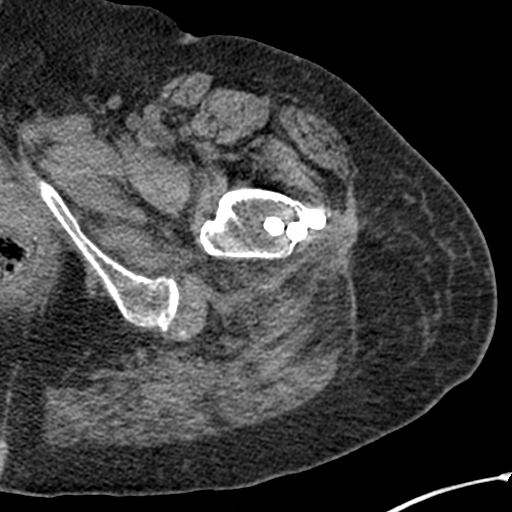
[im 41/106  soft-tissue]
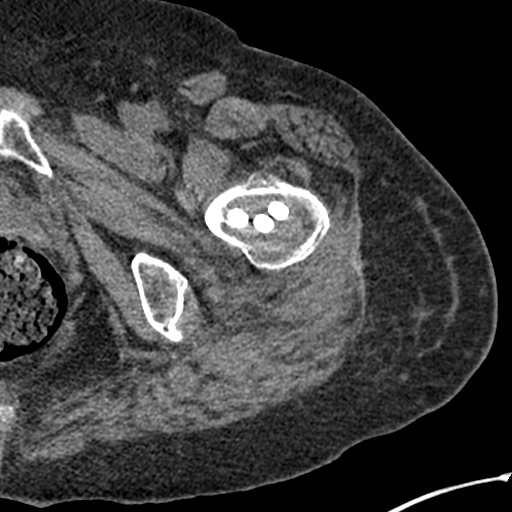
[im 55/106  soft-tissue]
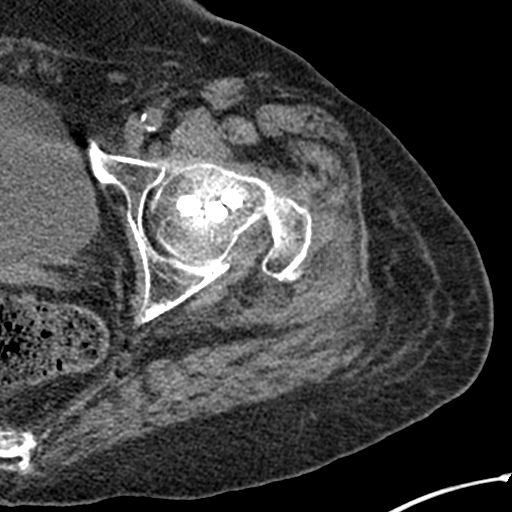
[im 65/106  soft-tissue]
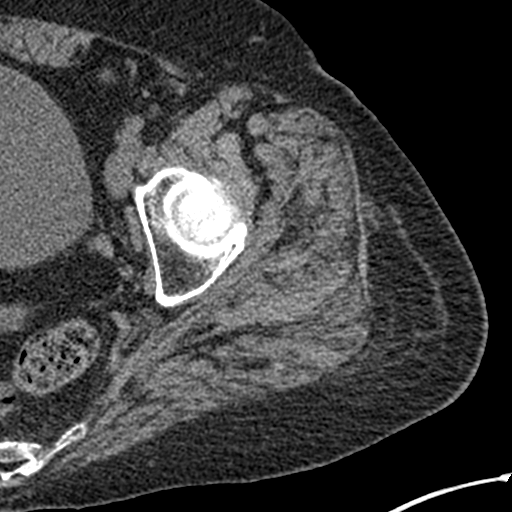
[im 75/106  soft-tissue]
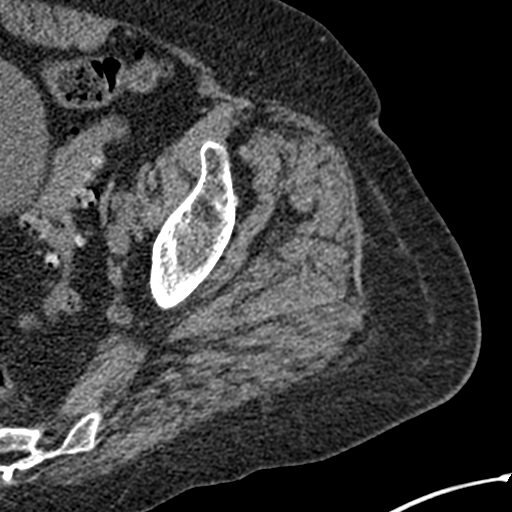
[im 89/106  soft-tissue]
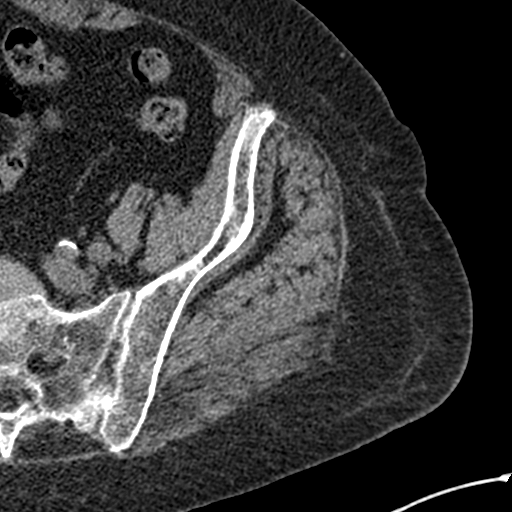
[im 92/106  lung]
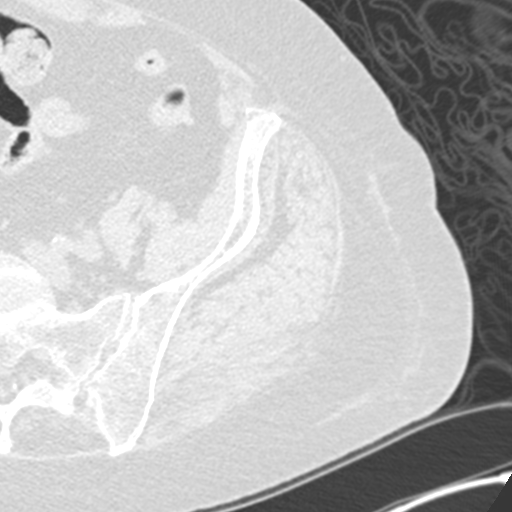
[im 95/106  lung]
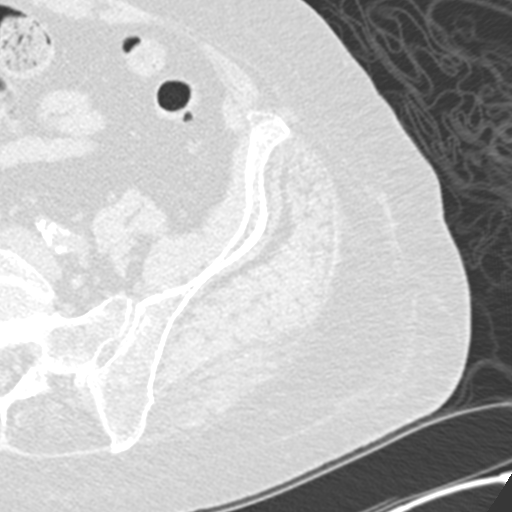
[im 99/106  soft-tissue]
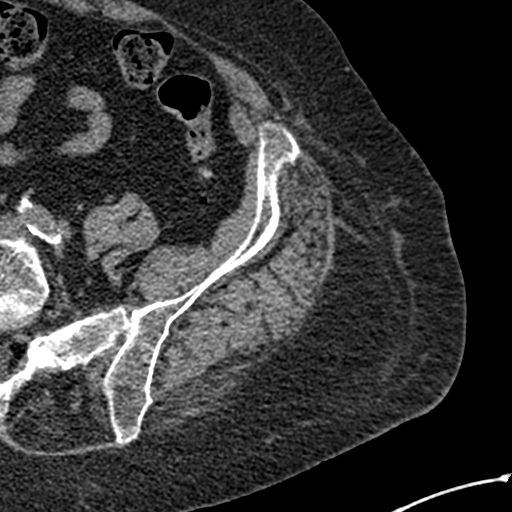
[im 99/106  lung]
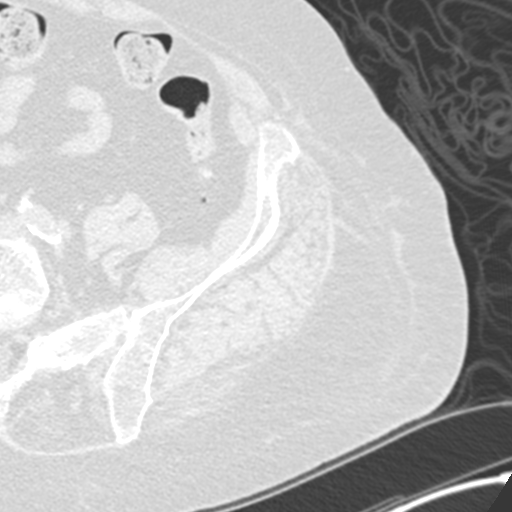
[im 99/106  bone]
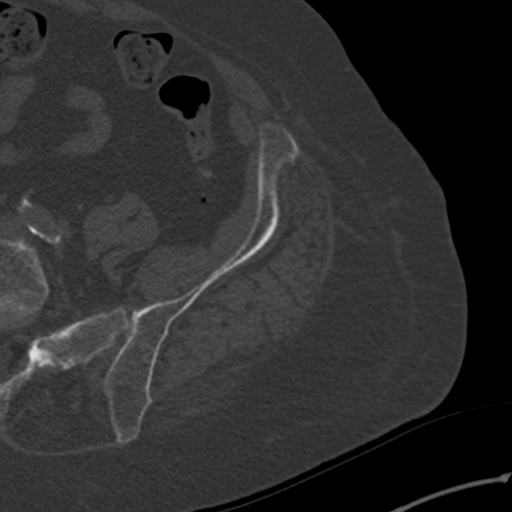
[im 102/106  lung]
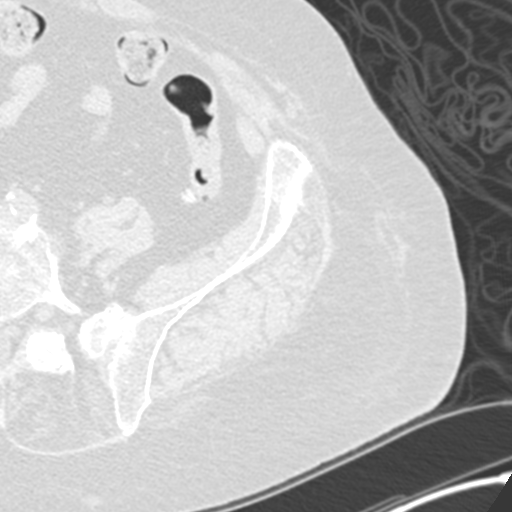

[Series 8: hip 2.0 cor. st · coronal · 0.41mm/px · 3 of 122 slices shown]
[im 31/122  soft-tissue]
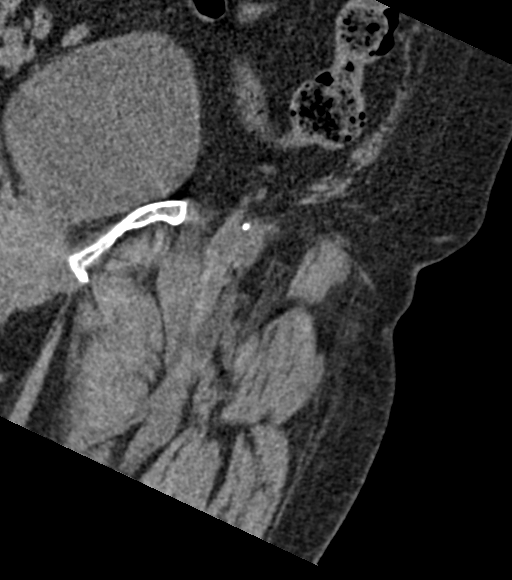
[im 61/122  soft-tissue]
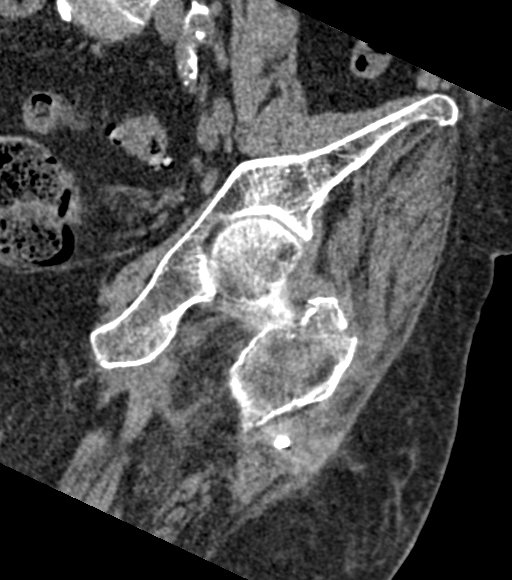
[im 91/122  soft-tissue]
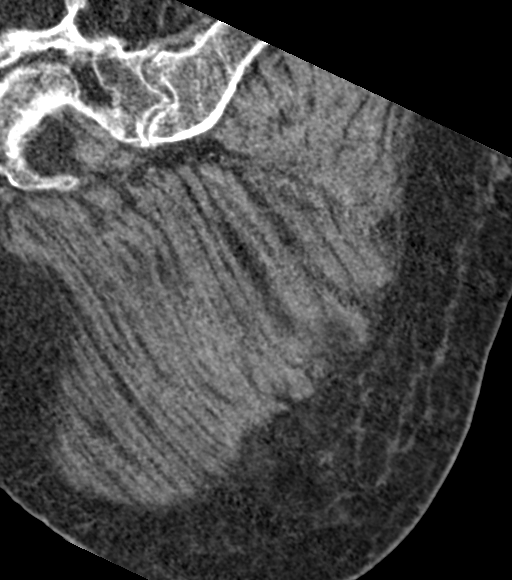

[15 of 42 positions shown; findings below may reference images not displayed]

FINDINGS: Bones/Joint/Cartilage

Patient is status post prior left hip ORIF via 3 cannulated
partially threaded screws. Hardware alignment remains unchanged from
prior. There is an acute fracture involving the tip of the left
greater trochanter with minimal medial displacement (series 3,
images 55-60; series 7, image 63). Fracture lines do not appear to
extend into the intertrochanteric portion of the femur. Fracture
lines do not appear to definitively extend to the adjacent hardware.
Hip joint is intact without dislocation. The visualized portion of
the left hemipelvis is intact. No left SI joint diastasis. No
suspicious bone lesion.

Ligaments

Suboptimally assessed by CT.

Muscles and Tendons

Musculotendinous structures appear within normal limits.

Soft tissues

There is mild induration within the subcutaneous soft tissues
overlying the lateral aspect of the left hip. No organized soft
tissue fluid collection or hematoma. Sigmoid diverticulosis.
Atherosclerosis is seen. No left inguinal lymphadenopathy.
IMPRESSION: 1. Acute minimally displaced fracture involving the tip of the left
greater trochanter.
2. Status post prior left hip ORIF with 3 cannulated partially
threaded screws. Hardware alignment remains unchanged from prior.
3. Mild induration within the subcutaneous soft tissues overlying
the lateral aspect of the left hip without organized soft tissue
fluid collection or hematoma.

## 2020-06-11 IMAGING — DX DG CHEST 1V PORT
1 series · 1 of 1 positions shown · non-contrast
Comparison: [DATE]

CLINICAL DATA: Recent fall. Reported history of previous lung
carcinoma

EXAM:
PORTABLE CHEST 1 VIEW

[chest]
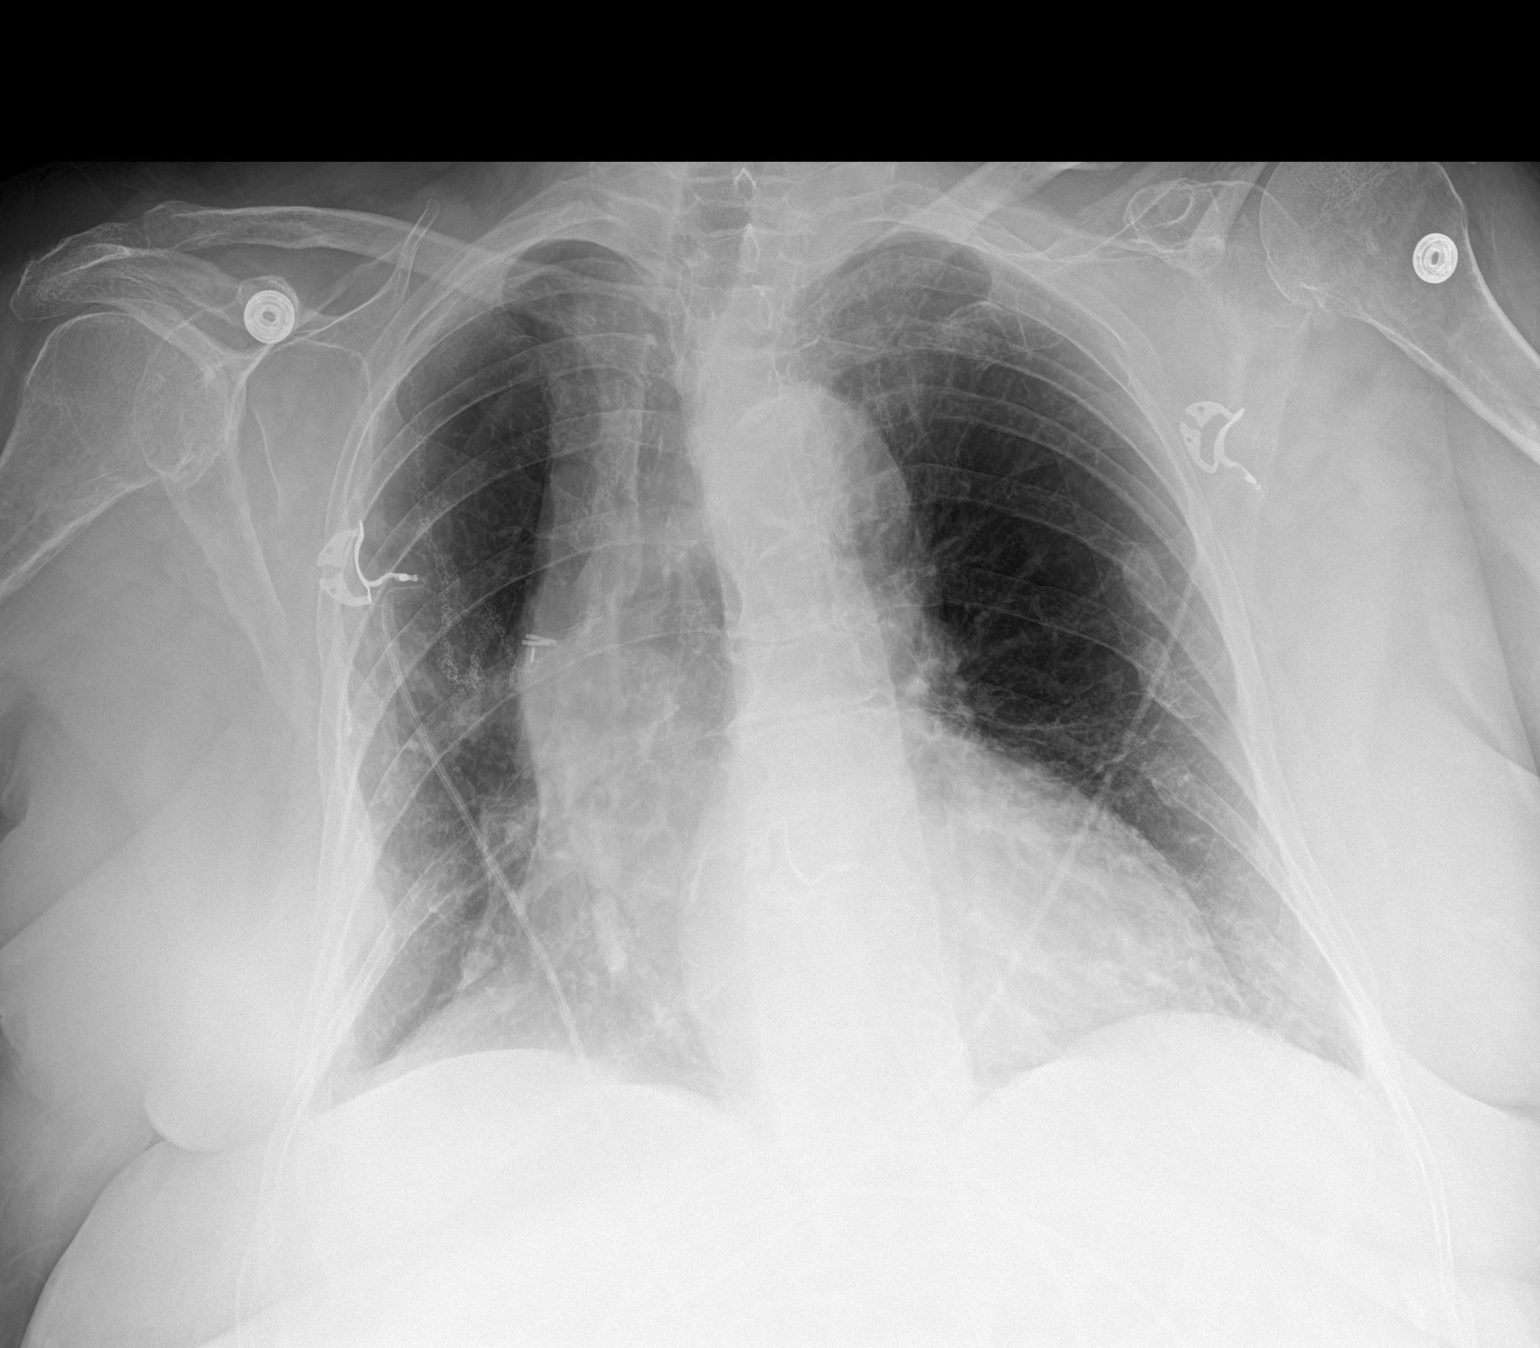

[1 of 1 positions shown; findings below may reference images not displayed]

FINDINGS: Postoperative change with volume loss on the right. No edema or
airspace opacity. Heart size and contour within normal limits. No
adenopathy. Old healed rib fractures noted on the left.
IMPRESSION: Postoperative changes with volume loss on the right. No edema or
airspace opacity. Stable cardiac silhouette. No adenopathy evident.

## 2020-06-11 MED ORDER — MIRTAZAPINE 15 MG PO TABS
7.5000 mg | ORAL_TABLET | Freq: Every day | ORAL | Status: DC
  Administered 2020-06-11 – 2020-06-14 (×4): 7.5 mg via ORAL
  Filled 2020-06-11 (×5): qty 1

## 2020-06-11 MED ORDER — HEPARIN SODIUM (PORCINE) 5000 UNIT/ML IJ SOLN
5000.0000 [IU] | Freq: Three times a day (TID) | INTRAMUSCULAR | Status: DC
  Administered 2020-06-12 – 2020-06-16 (×11): 5000 [IU] via SUBCUTANEOUS
  Filled 2020-06-11 (×12): qty 1

## 2020-06-11 MED ORDER — HYDROMORPHONE HCL 1 MG/ML IJ SOLN
1.0000 mg | INTRAMUSCULAR | Status: DC | PRN
  Administered 2020-06-11 (×2): 1 mg via INTRAVENOUS
  Filled 2020-06-11 (×2): qty 1

## 2020-06-11 MED ORDER — SODIUM CHLORIDE 0.9 % IV SOLN: INTRAVENOUS | Status: DC

## 2020-06-11 MED ORDER — FERROUS SULFATE 325 (65 FE) MG PO TABS
325.0000 mg | ORAL_TABLET | Freq: Every day | ORAL | Status: DC
  Administered 2020-06-11 – 2020-06-16 (×6): 325 mg via ORAL
  Filled 2020-06-11 (×6): qty 1

## 2020-06-11 MED ORDER — METOPROLOL TARTRATE 12.5 MG HALF TABLET
12.5000 mg | ORAL_TABLET | Freq: Two times a day (BID) | ORAL | Status: DC
  Administered 2020-06-11 – 2020-06-16 (×10): 12.5 mg via ORAL
  Filled 2020-06-11 (×11): qty 1

## 2020-06-11 MED ORDER — FLUTICASONE-UMECLIDIN-VILANT 100-62.5-25 MCG/INH IN AEPB: 1.0000 | INHALATION_SPRAY | Freq: Every day | RESPIRATORY_TRACT | Status: DC

## 2020-06-11 MED ORDER — DIAZEPAM 5 MG PO TABS
2.5000 mg | ORAL_TABLET | Freq: Three times a day (TID) | ORAL | Status: DC | PRN
  Administered 2020-06-16: 2.5 mg via ORAL
  Filled 2020-06-11: qty 1

## 2020-06-11 MED ORDER — HEPARIN SODIUM (PORCINE) 5000 UNIT/ML IJ SOLN
5000.0000 [IU] | Freq: Three times a day (TID) | INTRAMUSCULAR | Status: DC
  Administered 2020-06-11: 5000 [IU] via SUBCUTANEOUS
  Filled 2020-06-11: qty 1

## 2020-06-11 MED ORDER — SENNOSIDES-DOCUSATE SODIUM 8.6-50 MG PO TABS
2.0000 | ORAL_TABLET | Freq: Two times a day (BID) | ORAL | Status: DC
  Administered 2020-06-11 – 2020-06-14 (×7): 2 via ORAL
  Filled 2020-06-11 (×7): qty 2

## 2020-06-11 MED ORDER — MUPIROCIN 2 % EX OINT
1.0000 "application " | TOPICAL_OINTMENT | Freq: Two times a day (BID) | CUTANEOUS | Status: DC
  Administered 2020-06-12 – 2020-06-16 (×9): 1 via NASAL
  Filled 2020-06-11 (×3): qty 22

## 2020-06-11 MED ORDER — CHLORHEXIDINE GLUCONATE CLOTH 2 % EX PADS
6.0000 | MEDICATED_PAD | Freq: Every day | CUTANEOUS | Status: DC
  Administered 2020-06-12 – 2020-06-15 (×3): 6 via TOPICAL

## 2020-06-11 MED ORDER — ALBUTEROL SULFATE (2.5 MG/3ML) 0.083% IN NEBU: 2.5000 mg | INHALATION_SOLUTION | RESPIRATORY_TRACT | Status: DC | PRN

## 2020-06-11 MED ORDER — ALBUTEROL SULFATE HFA 108 (90 BASE) MCG/ACT IN AERS: 2.0000 | INHALATION_SPRAY | Freq: Four times a day (QID) | RESPIRATORY_TRACT | Status: DC

## 2020-06-11 MED ORDER — UMECLIDINIUM BROMIDE 62.5 MCG/INH IN AEPB
1.0000 | INHALATION_SPRAY | Freq: Every day | RESPIRATORY_TRACT | Status: DC
  Administered 2020-06-11 – 2020-06-16 (×6): 1 via RESPIRATORY_TRACT
  Filled 2020-06-11 (×2): qty 7

## 2020-06-11 MED ORDER — PANTOPRAZOLE SODIUM 40 MG PO TBEC
40.0000 mg | DELAYED_RELEASE_TABLET | Freq: Every day | ORAL | Status: DC
  Administered 2020-06-11 – 2020-06-16 (×5): 40 mg via ORAL
  Filled 2020-06-11 (×5): qty 1

## 2020-06-11 MED ORDER — ATORVASTATIN CALCIUM 10 MG PO TABS
20.0000 mg | ORAL_TABLET | Freq: Every evening | ORAL | Status: DC
  Administered 2020-06-11 – 2020-06-16 (×6): 20 mg via ORAL
  Filled 2020-06-11 (×6): qty 2

## 2020-06-11 MED ORDER — GABAPENTIN 400 MG PO CAPS
400.0000 mg | ORAL_CAPSULE | Freq: Four times a day (QID) | ORAL | Status: DC
  Administered 2020-06-11 – 2020-06-16 (×21): 400 mg via ORAL
  Filled 2020-06-11 (×22): qty 1

## 2020-06-11 MED ORDER — SERTRALINE HCL 100 MG PO TABS
100.0000 mg | ORAL_TABLET | Freq: Every day | ORAL | Status: DC
  Administered 2020-06-11 – 2020-06-16 (×6): 100 mg via ORAL
  Filled 2020-06-11 (×6): qty 1

## 2020-06-11 MED ORDER — OXYCODONE HCL 5 MG PO TABS: 5.0000 mg | ORAL_TABLET | ORAL | Status: DC | PRN

## 2020-06-11 MED ORDER — OXYBUTYNIN CHLORIDE ER 5 MG PO TB24
5.0000 mg | ORAL_TABLET | Freq: Every day | ORAL | Status: DC
  Administered 2020-06-11 – 2020-06-14 (×4): 5 mg via ORAL
  Filled 2020-06-11 (×8): qty 1

## 2020-06-11 MED ORDER — AMLODIPINE BESYLATE 5 MG PO TABS
5.0000 mg | ORAL_TABLET | Freq: Every day | ORAL | Status: DC
  Administered 2020-06-11 – 2020-06-16 (×6): 5 mg via ORAL
  Filled 2020-06-11 (×6): qty 1

## 2020-06-11 MED ORDER — FLUTICASONE FUROATE-VILANTEROL 100-25 MCG/INH IN AEPB
1.0000 | INHALATION_SPRAY | Freq: Every day | RESPIRATORY_TRACT | Status: DC
  Administered 2020-06-11 – 2020-06-16 (×6): 1 via RESPIRATORY_TRACT
  Filled 2020-06-11: qty 28

## 2020-06-11 MED ORDER — METOPROLOL TARTRATE 5 MG/5ML IV SOLN: 2.5000 mg | Freq: Four times a day (QID) | INTRAVENOUS | Status: DC

## 2020-06-11 MED ORDER — LACTATED RINGERS IV SOLN: INTRAVENOUS | Status: DC

## 2020-06-11 NOTE — Progress Notes (Signed)
Ortho Note  Received consult this AM around 6:15 from Dr. [place] Crane. I have reviewed imaging. There appears to be fracture of greater trochanter around previous femoral neck screws. I have ordered a CT scan to assess full involvement to determine operative vs nonoperative treatment.  Shona Needles, MD [facility] [phone] (office) [email or web address]

## 2020-06-11 NOTE — Progress Notes (Signed)
Orthopedic Tech Progress Note Patient Details:  [name] [birth year] [id number]  Ortho Devices Type of Ortho Device: Sugartong splint Ortho Device/Splint Location: LUE Ortho Device/Splint Interventions: Ordered, Application   Post Interventions Patient Tolerated: Well Instructions Provided: Care of device   Janit Pagan 06/11/2020, 12:43 AM

## 2020-06-11 NOTE — H&P (Addendum)
History and Physical  [name] CWC:[id number] DOB: March 13, 1941 DOA: 06/10/2020  Referring physician: Dr. Sedonia Small, [place]  PCP: Wenda Low, MD  Outpatient Specialists: Oncology. Patient coming from: Home.  Chief Complaint: Fall  HPI: [name] is a 80 y.o. female with medical history significant for lung cancer, COPD who presented from home after mechanical fall.  She tripped over her grandchild toy, fell and struck her head against the ground.  Endorses left wrist pain and left hip pain.  She was in her usual state of health prior to this.  Upon arrival at [facility] ED CT head nonacute.  Work-up revealed acute left intratrochanteric femur fracture, and mildly displaced and angulated fracture of the distal radial metaphysis and probable nondisplaced fracture of the ulnar styloid.  TRH, hospitalist team, asked to admit.  ED Course: Elevated BP likely contributed by pain.  Lab studies remarkable for elevated potassium 5.5, creatinine 1.55 with GFR of 34, baseline creatinine is 0.8 with GFR greater than 60.  Drop of hemoglobin 9.3 with baseline hemoglobin of 10  Review of Systems: Review of systems as noted in the HPI. All other systems reviewed and are negative.   Past Medical History:  Diagnosis Date  . Adenocarcinoma of right lung, stage 1 ([place]) 07/28/2016  . Anxiety   . Asthma   . Cancer ([place])    uterine  . COPD (chronic obstructive pulmonary disease) ([place])   . Coronary artery disease    mild nonobstructive by 2019 cath  . Cyst    left side of neck  . Depression   . GERD (gastroesophageal reflux disease)   . Heart murmur   . Hyperlipidemia   . Hypertension   . Low back pain   . Osteoarthritis   . Osteoporosis   . Takotsubo cardiomyopathy    EF recovered to 50-55% by 01/23/18 echo   Past Surgical History:  Procedure Laterality Date  . ANKLE SURGERY Right    horse accident  . APPENDECTOMY    . BACK SURGERY    . CHOLECYSTECTOMY    . EYE SURGERY     lense implant  .  FRACTURE SURGERY    . HIP SURGERY  01/2010   Left Hip   . INNER EAR SURGERY Bilateral 1970   related to severe ear infections  . KNEE SURGERY     Right knee  . LEFT HEART CATH AND CORONARY ANGIOGRAPHY N/A 12/11/2017   Procedure: LEFT HEART CATH AND CORONARY ANGIOGRAPHY;  Surgeon: Jettie Booze, MD;  Location: [place] [facility];  Service: Cardiovascular;  Laterality: N/A;  . LOBECTOMY Right 05/09/2016   Procedure: RIGHT UPPER LOBECTOMY;  Surgeon: [name], MD;  Location: [place];  Service: Thoracic;  Laterality: Right;  . MASS EXCISION  08/25/2011   Procedure: EXCISION MASS;  Surgeon: Harl Bowie, MD;  Location: [place];  Service: General;  Laterality: N/A;  excision left neck mass  . TOTAL ABDOMINAL HYSTERECTOMY    . TOTAL KNEE ARTHROPLASTY Right 05/27/2019   Procedure: TOTAL KNEE ARTHROPLASTY;  Surgeon: Vickey Huger, MD;  Location: [facility];  Service: Orthopedics;  Laterality: Right;  75 mins needed for length of case  . TOTAL KNEE ARTHROPLASTY Left 12/30/2019   Procedure: TOTAL KNEE ARTHROPLASTY;  Surgeon: Vickey Huger, MD;  Location: [facility];  Service: Orthopedics;  Laterality: Left;  Marland Kitchen VIDEO ASSISTED THORACOSCOPY (VATS)/WEDGE RESECTION Right 05/09/2016   Procedure: VIDEO ASSISTED THORACOSCOPY (VATS)/WEDGE RESECTION;  Surgeon: [name], MD;  Location: [facility]  OR;  Service: Thoracic;  Laterality: Right;    Social History:  reports that she quit smoking about 8 years ago. Her smoking use included cigarettes. She has a 102.00 pack-year smoking history. She has never used smokeless tobacco. She reports that she does not drink alcohol and does not use drugs.   Allergies  Allergen Reactions  . Boniva [Ibandronate Sodium] Other (See Comments)    UNSPECIFIED REACTION   . Lyrica [Pregabalin] Nausea And Vomiting    Dizziness and blurred vision as well  . Levofloxacin     Pt states she isn't sure     Family History  Problem Relation Age of Onset   . Heart disease Mother   . Heart disease Father   . Cancer Sister        #1, breast  . Heart disease Brother        had CABG      Prior to Admission medications   Medication Sig Start Date End Date Taking? Authorizing Provider  acetaminophen (TYLENOL) 500 MG tablet Take 2 tablets (1,000 mg total) by mouth every 6 (six) hours. Patient taking differently: Take 1,000 mg by mouth every 6 (six) hours as needed for moderate pain.  05/28/19  Yes Donia Ast, PA  albuterol (PROVENTIL HFA;VENTOLIN HFA) 108 (90 BASE) MCG/ACT inhaler Inhale 2 puffs into the lungs every 6 (six) hours as needed. Patient taking differently: Inhale 2 puffs into the lungs every 4 (four) hours as needed for wheezing or shortness of breath.  04/28/15  Yes Hosie Poisson, MD  albuterol (PROVENTIL) (2.5 MG/3ML) 0.083% nebulizer solution Take 3 mLs (2.5 mg total) by nebulization every 2 (two) hours as needed for wheezing or shortness of breath. 04/28/15  Yes Hosie Poisson, MD  aluminum-magnesium hydroxide-simethicone (MAALOX) 242-353-61 MG/5ML SUSP Take 30 mLs by mouth as needed (indigestion).   Yes [provider]  amLODipine (NORVASC) 5 MG tablet Take 5 mg by mouth daily.    [provider]  aspirin EC 325 MG EC tablet Take 1 tablet (325 mg total) by mouth 2 (two) times daily. 12/31/19   Donia Ast, PA  atorvastatin (LIPITOR) 20 MG tablet Take 20 mg by mouth every evening.     [provider]  Calcium Carb-Cholecalciferol (CALCIUM-VITAMIN D) 600-400 MG-UNIT TABS Take 1 tablet by mouth daily.    [provider]  diazepam (VALIUM) 5 MG tablet Take 0.5 tablets (2.5 mg total) by mouth every 8 (eight) hours as needed for anxiety (for nerves). Must last 30 days. Filled 11-28-17 Patient taking differently: Take 5 mg by mouth every 8 (eight) hours as needed for anxiety (for nerves). Must last 30 days. Filled 11-28-17 06/05/19   Donia Ast, PA  ferrous sulfate 325 (65 FE) MG  tablet Take 325 mg by mouth daily with breakfast.    [provider]  furosemide (LASIX) 20 MG tablet Take 20 mg by mouth 2 (two) times a week. Monday and Wednesday 11/04/19   [provider]  gabapentin (NEURONTIN) 400 MG capsule Take 1 capsule (400 mg total) by mouth 4 (four) times daily. 05/24/18 12/17/19  Vevelyn Francois, NP  lisinopril (PRINIVIL,ZESTRIL) 10 MG tablet Take 1 tablet (10 mg total) by mouth every morning. 12/12/17   Domenic Polite, MD  metoCLOPramide (REGLAN) 5 MG tablet Take 5 mg by mouth 2 (two) times daily.     [provider]  metoprolol tartrate (LOPRESSOR) 25 MG tablet Take 0.5 tablets (12.5 mg total) by mouth 2 (two)  times daily. Patient taking differently: Take 12.5 mg by mouth every morning.  12/12/17   Domenic Polite, MD  mirtazapine (REMERON) 7.5 MG tablet Take 7.5 mg by mouth at bedtime. 11/04/19   [provider]  ondansetron (ZOFRAN ODT) 4 MG disintegrating tablet 4mg  ODT q4 hours prn nausea/vomit Patient not taking: Reported on 12/17/2019 07/09/19   Deno Etienne, DO  oxybutynin (DITROPAN-XL) 5 MG 24 hr tablet Take 5 mg by mouth at bedtime. 09/06/19   [provider]  oxyCODONE (OXY IR/ROXICODONE) 5 MG immediate release tablet Take 1-2 tablets (5-10 mg total) by mouth every 6 (six) hours as needed for moderate pain (pain score 4-6). 12/31/19   Donia Ast, PA  pantoprazole (PROTONIX) 40 MG tablet Take 1 tablet (40 mg total) by mouth daily at 6 (six) AM. 04/28/15   Hosie Poisson, MD  raloxifene (EVISTA) 60 MG tablet Take 60 mg by mouth daily.    [provider]  sertraline (ZOLOFT) 100 MG tablet Take 100 mg by mouth in the morning and at bedtime.     [provider]  tiZANidine (ZANAFLEX) 2 MG tablet Take 1 tablet (2 mg total) by mouth every 6 (six) hours as needed. Patient taking differently: Take 2 mg by mouth every 6 (six) hours as needed for muscle spasms.  05/28/19   Donia Ast, PA  traMADol  (ULTRAM) 50 MG tablet Take 1 tablet (50 mg total) by mouth 3 (three) times daily as needed. 06/05/19   Donia Ast, PA  TRELEGY ELLIPTA 100-62.5-25 MCG/INH AEPB Inhale 1 puff into the lungs daily. 05/14/19   [provider]    Physical Exam: BP (!) 144/74   Pulse 83   Temp 99.3 F (37.4 C) (Oral)   Resp 19   SpO2 97%   . General: 79 y.o. year-old female well developed well nourished in no acute distress.  Alert and oriented x3. . Cardiovascular: Regular rate and rhythm with no rubs or gallops.  No thyromegaly or JVD noted.  No lower extremity edema. 2/4 pulses in all 4 extremities. Marland Kitchen Respiratory: Clear to auscultation with no wheezes or rales. Good inspiratory effort. . Abdomen: Soft nontender nondistended with normal bowel sounds x4 quadrants. . Muskuloskeletal: No cyanosis, clubbing or edema noted bilaterally . Neuro: CN II-XII intact, strength, sensation, reflexes . Skin: No ulcerative lesions noted or rashes . Psychiatry: Judgement and insight appear normal. Mood is appropriate for condition and setting          Labs on Admission:  Basic Metabolic Panel: Recent Labs  Lab 06/10/20 2122  NA 141  K 5.5*  CL 104  CO2 25  GLUCOSE 114*  BUN 22  CREATININE 1.55*  CALCIUM 8.7*   Liver Function Tests: No results for input(s): AST, ALT, ALKPHOS, BILITOT, PROT, ALBUMIN in the last 168 hours. No results for input(s): LIPASE, AMYLASE in the last 168 hours. No results for input(s): AMMONIA in the last 168 hours. CBC: Recent Labs  Lab 06/10/20 2122  WBC 11.7*  HGB 9.3*  HCT 32.0*  MCV 86.7  PLT 301   Cardiac Enzymes: No results for input(s): CKTOTAL, CKMB, CKMBINDEX, TROPONINI in the last 168 hours.  BNP (last 3 results) No results for input(s): BNP in the last 8760 hours.  ProBNP (last 3 results) No results for input(s): PROBNP in the last 8760 hours.  CBG: No results for input(s): GLUCAP in the last 168 hours.  Radiological Exams on  Admission: DG Wrist Complete Left  Result  Date: 06/10/2020 CLINICAL DATA:  79 year old female with fall and left wrist pain. EXAM: LEFT WRIST - COMPLETE 3+ VIEW COMPARISON:  Left hand radiograph dated 10/10/2018. FINDINGS: There is a mildly displaced and impacted fracture of the distal radial metaphysis with approximately 4 mm dorsal displacement and angulation of the distal fracture fragment. Probable nondisplaced fracture of the ulnar styloid. There is no dislocation. There is widening of the scapholunate distance. Advanced osteopenia. The soft tissue swelling of the wrist. IMPRESSION: Mildly displaced and angulated fracture of the distal radial metaphysis and probable nondisplaced fracture of the ulnar styloid. Electronically Signed   By: Anner Crete M.D.   On: 06/10/2020 17:15   CT HEAD WO CONTRAST  Result Date: 06/10/2020 CLINICAL DATA:  Fall facial trauma EXAM: CT HEAD WITHOUT CONTRAST TECHNIQUE: Contiguous axial images were obtained from the base of the skull through the vertex without intravenous contrast. COMPARISON:  September 14, 2019 FINDINGS: Brain: No evidence of acute territorial infarction, hemorrhage, hydrocephalus,extra-axial collection or mass lesion/mass effect. There is dilatation the ventricles and sulci consistent with age-related atrophy. Low-attenuation changes in the deep white matter consistent with small vessel ischemia. Vascular: No hyperdense vessel or unexpected calcification. Skull: The skull is intact. No fracture or focal lesion identified. Sinuses/Orbits: The visualized paranasal sinuses and mastoid air cells are clear. The orbits and globes intact. Other: None IMPRESSION: No acute intracranial abnormality. Findings consistent with age related atrophy and chronic small vessel ischemia Electronically Signed   By: Prudencio Pair M.D.   On: 06/10/2020 21:26   DG Hip Unilat W or Wo Pelvis 2-3 Views Left  Result Date: 06/10/2020 CLINICAL DATA:  Fall left hip pain EXAM:  DG HIP (WITH OR WITHOUT PELVIS) 2-3V LEFT COMPARISON:  June 21, 2017 FINDINGS: The patient is status post screw fixation of the proximal left femur. There appears to be in acute intratrochanteric fracture with mildly displaced fractures through the greater trochanter. Overlying soft tissue swelling is seen. Healed fracture deformity seen at the inferior right pubic rami. There is diffuse osteopenia. IMPRESSION: Acute left intratrochanteric femur fracture Electronically Signed   By: Prudencio Pair M.D.   On: 06/10/2020 21:19    EKG:  None available at the time of this visit.  Assessment/Plan Present on Admission: **None**  Active Problems:   S/p left hip fracture  Acute left hip fracture status post mechanical fall at home N.p.o. due to possible surgical intervention Start IV fluid and LR at 50 cc/h x 2 days. Pain control along with bowel regimen and IV antiemetics as needed Ortho consult  Acute left wrist fracture Currently in a splint Pain control as above  AKI likely prerenal Baseline creatinine appears to be 0.8 with GFR greater than 60 Presented with creatinine of 1.5 with GFR 34 Avoid nephrotoxins and dehydration Monitor urine output Renal panel in the morning  Hyperkalemia Serum potassium 5.5 Get twelve-lead EKG Continue IV fluid Repeat level in the morning  Acute blood loss anemia from fracture Monitor H&H Transfuse hemoglobin less than 7.0.  Essential hypertension BP is not controlled Resume home oral antihypertensives Monitor vital signs  Chronic anxiety/depression Resume home regimen  History of lung cancer Restart when appropriate, curbside with oncology in the morning  COPD Resume home regimen   DVT prophylaxis: Subcu heparin 3 times daily  Code Status: Full code as stable patient himself.  Family Communication: None at bedside  Disposition Plan: Admit to telemetry medical  Consults called: Ortho, Dr. Doreatha Martin.  Admission status: Inpatient  status.  Status is: Inpatient    Dispo: The patient is from: Home.               Anticipated d/c is to: Home with home services versus SNF               Anticipated d/c date is: 06/13/2020               Patient currently not stable for discharge due to ongoing management of acute left hip fracture.        Kayleen Memos MD [facility] Pager [phone]  If 7PM-7AM, please contact night-coverage [email or web address] Password [facility]  06/11/2020, 12:26 AM

## 2020-06-11 NOTE — Consult Note (Signed)
Reason for Consult:Left wrist, hip fxs Referring Physician: Marlowe Sax  [name] is an 79 y.o. female.  HPI: [name] was at home when she slipped on one of her grandchildren's balls and fell. She fell onto her left side and had immediate wrist and hip pain. She could not get up or bear weight. She was brought to the ED where x-rays showed left wrist and hip fxs and orthopedic surgery was consulted. She c/o localized pain to those areas.  Past Medical History:  Diagnosis Date  . Adenocarcinoma of right lung, stage 1 ([place]) 07/28/2016  . Anxiety   . Asthma   . Cancer ([place])    uterine  . COPD (chronic obstructive pulmonary disease) ([place])   . Coronary artery disease    mild nonobstructive by 2019 cath  . Cyst    left side of neck  . Depression   . GERD (gastroesophageal reflux disease)   . Heart murmur   . Hyperlipidemia   . Hypertension   . Low back pain   . Osteoarthritis   . Osteoporosis   . Takotsubo cardiomyopathy    EF recovered to 50-55% by 01/23/18 echo    Past Surgical History:  Procedure Laterality Date  . ANKLE SURGERY Right    horse accident  . APPENDECTOMY    . BACK SURGERY    . CHOLECYSTECTOMY    . EYE SURGERY     lense implant  . FRACTURE SURGERY    . HIP SURGERY  01/2010   Left Hip   . INNER EAR SURGERY Bilateral 1970   related to severe ear infections  . KNEE SURGERY     Right knee  . LEFT HEART CATH AND CORONARY ANGIOGRAPHY N/A 12/11/2017   Procedure: LEFT HEART CATH AND CORONARY ANGIOGRAPHY;  Surgeon: Jettie Booze, MD;  Location: [facility];  Service: Cardiovascular;  Laterality: N/A;  . LOBECTOMY Right 05/09/2016   Procedure: RIGHT UPPER LOBECTOMY;  Surgeon: [name], MD;  Location: [facility];  Service: Thoracic;  Laterality: Right;  . MASS EXCISION  08/25/2011   Procedure: EXCISION MASS;  Surgeon: Harl Bowie, MD;  Location: [facility];  Service: General;  Laterality: N/A;  excision left neck mass  . TOTAL  ABDOMINAL HYSTERECTOMY    . TOTAL KNEE ARTHROPLASTY Right 05/27/2019   Procedure: TOTAL KNEE ARTHROPLASTY;  Surgeon: Vickey Huger, MD;  Location: [facility];  Service: Orthopedics;  Laterality: Right;  75 mins needed for length of case  . TOTAL KNEE ARTHROPLASTY Left 12/30/2019   Procedure: TOTAL KNEE ARTHROPLASTY;  Surgeon: Vickey Huger, MD;  Location: [facility];  Service: Orthopedics;  Laterality: Left;  Marland Kitchen VIDEO ASSISTED THORACOSCOPY (VATS)/WEDGE RESECTION Right 05/09/2016   Procedure: VIDEO ASSISTED THORACOSCOPY (VATS)/WEDGE RESECTION;  Surgeon: [name], MD;  Location: [facility];  Service: Thoracic;  Laterality: Right;    Family History  Problem Relation Age of Onset  . Heart disease Mother   . Heart disease Father   . Cancer Sister        #1, breast  . Heart disease Brother        had CABG    Social History:  reports that she quit smoking about 8 years ago. Her smoking use included cigarettes. She has a 102.00 pack-year smoking history. She has never used smokeless tobacco. She reports that she does not drink alcohol and does not use drugs.  Allergies:  Allergies  Allergen Reactions  . Boniva [Ibandronate Sodium] Other (See  Comments)    UNSPECIFIED REACTION   . Lyrica [Pregabalin] Nausea And Vomiting    Dizziness and blurred vision as well  . Levofloxacin     Pt states she isn't sure     Medications: I have reviewed the patient's current medications.  Results for orders placed or performed during the hospital encounter of 06/10/20 (from the past 48 hour(s))  CBC     Status: Abnormal   Collection Time: 06/10/20  9:22 PM  Result Value Ref Range   WBC 11.7 (H) 4.0 - 10.5 K/uL   RBC 3.69 (L) 3.87 - 5.11 MIL/uL   Hemoglobin 9.3 (L) 12.0 - 15.0 g/dL   HCT 32.0 (L) 36 - 46 %   MCV 86.7 80.0 - 100.0 fL   MCH 25.2 (L) 26.0 - 34.0 pg   MCHC 29.1 (L) 30.0 - 36.0 g/dL   RDW 19.3 (H) 11.5 - 15.5 %   Platelets 301 150 - 400 K/uL   nRBC 0.0 0.0 - 0.2 %    Comment: Performed at  [facility], [address], [place], [place] [address]  Basic metabolic panel     Status: Abnormal   Collection Time: 06/10/20  9:22 PM  Result Value Ref Range   Sodium 141 135 - 145 mmol/L   Potassium 5.5 (H) 3.5 - 5.1 mmol/L   Chloride 104 98 - 111 mmol/L   CO2 25 22 - 32 mmol/L   Glucose, Bld 114 (H) 70 - 99 mg/dL    Comment: Glucose reference range applies only to samples taken after fasting for at least 8 hours.   BUN 22 8 - 23 mg/dL   Creatinine, Ser 1.55 (H) 0.44 - 1.00 mg/dL   Calcium 8.7 (L) 8.9 - 10.3 mg/dL   GFR, Estimated 34 (L) >60 mL/min    Comment: (NOTE) Calculated using the CKD-EPI Creatinine Equation (2021)    Anion gap 12 5 - 15    Comment: Performed at [facility] [address] [place], [place] [address]  Respiratory Panel by RT PCR (Flu A&B, Covid) - Nasopharyngeal Swab     Status: None   Collection Time: 06/10/20 11:18 PM   Specimen: Nasopharyngeal Swab  Result Value Ref Range   SARS Coronavirus 2 by RT PCR NEGATIVE NEGATIVE    Comment: (NOTE) SARS-CoV-2 target nucleic acids are NOT DETECTED.  The SARS-CoV-2 RNA is generally detectable in upper respiratoy specimens during the acute phase of infection. The lowest concentration of SARS-CoV-2 viral copies this assay can detect is 131 copies/mL. A negative result does not preclude SARS-Cov-2 infection and should not be used as the sole basis for treatment or other patient management decisions. A negative result may occur with  improper specimen collection/handling, submission of specimen other than nasopharyngeal swab, presence of viral mutation(s) within the areas targeted by this assay, and inadequate number of viral copies (<131 copies/mL). A negative result must be combined with clinical observations, patient history, and epidemiological information. The expected result is Negative.  Fact Sheet for Patients:  PinkCheek.be  Fact Sheet for Healthcare  Providers:  GravelBags.it  This test is no t yet approved or cleared by the [place] FDA and  has been authorized for detection and/or diagnosis of SARS-CoV-2 by FDA under an Emergency Use Authorization (EUA). This EUA will remain  in effect (meaning this test can be used) for the duration of the COVID-19 declaration under Section 564(b)(1) of the Act, 21 U.S.C. section 360bbb-3(b)(1), unless the authorization is  terminated or revoked sooner.     Influenza A by PCR NEGATIVE NEGATIVE   Influenza B by PCR NEGATIVE NEGATIVE    Comment: (NOTE) The Xpert Xpress SARS-CoV-2/FLU/RSV assay is intended as an aid in  the diagnosis of influenza from Nasopharyngeal swab specimens and  should not be used as a sole basis for treatment. Nasal washings and  aspirates are unacceptable for Xpert Xpress SARS-CoV-2/FLU/RSV  testing.  Fact Sheet for Patients: PinkCheek.be  Fact Sheet for Healthcare Providers: GravelBags.it  This test is not yet approved or cleared by the [place] FDA and  has been authorized for detection and/or diagnosis of SARS-CoV-2 by  FDA under an Emergency Use Authorization (EUA). This EUA will remain  in effect (meaning this test can be used) for the duration of the  Covid-19 declaration under Section 564(b)(1) of the Act, 21  U.S.C. section 360bbb-3(b)(1), unless the authorization is  terminated or revoked. Performed at [facility], [place] [address], [place], [place] [address]     DG Wrist Complete Left  Result Date: 06/10/2020 CLINICAL DATA:  79 year old female with fall and left wrist pain. EXAM: LEFT WRIST - COMPLETE 3+ VIEW COMPARISON:  Left hand radiograph dated 10/10/2018. FINDINGS: There is a mildly displaced and impacted fracture of the distal radial metaphysis with approximately 4 mm dorsal displacement and angulation of the distal fracture fragment. Probable  nondisplaced fracture of the ulnar styloid. There is no dislocation. There is widening of the scapholunate distance. Advanced osteopenia. The soft tissue swelling of the wrist. IMPRESSION: Mildly displaced and angulated fracture of the distal radial metaphysis and probable nondisplaced fracture of the ulnar styloid. Electronically Signed   By: Anner Crete M.D.   On: 06/10/2020 17:15   CT HEAD WO CONTRAST  Result Date: 06/10/2020 CLINICAL DATA:  Fall facial trauma EXAM: CT HEAD WITHOUT CONTRAST TECHNIQUE: Contiguous axial images were obtained from the base of the skull through the vertex without intravenous contrast. COMPARISON:  September 14, 2019 FINDINGS: Brain: No evidence of acute territorial infarction, hemorrhage, hydrocephalus,extra-axial collection or mass lesion/mass effect. There is dilatation the ventricles and sulci consistent with age-related atrophy. Low-attenuation changes in the deep white matter consistent with small vessel ischemia. Vascular: No hyperdense vessel or unexpected calcification. Skull: The skull is intact. No fracture or focal lesion identified. Sinuses/Orbits: The visualized paranasal sinuses and mastoid air cells are clear. The orbits and globes intact. Other: None IMPRESSION: No acute intracranial abnormality. Findings consistent with age related atrophy and chronic small vessel ischemia Electronically Signed   By: Prudencio Pair M.D.   On: 06/10/2020 21:26   CT HIP LEFT WO CONTRAST  Result Date: 06/11/2020 CLINICAL DATA:  Evaluate left hip fracture EXAM: CT OF THE LEFT HIP WITHOUT CONTRAST TECHNIQUE: Multidetector CT imaging of the left hip was performed according to the standard protocol. Multiplanar CT image reconstructions were also generated. COMPARISON:  X-ray 06/10/2020 FINDINGS: Bones/Joint/Cartilage Patient is status post prior left hip ORIF via 3 cannulated partially threaded screws. Hardware alignment remains unchanged from prior. There is an acute fracture  involving the tip of the left greater trochanter with minimal medial displacement (series 3, images 55-60; series 7, image 63). Fracture lines do not appear to extend into the intertrochanteric portion of the femur. Fracture lines do not appear to definitively extend to the adjacent hardware. Hip joint is intact without dislocation. The visualized portion of the left hemipelvis is intact. No left SI joint diastasis. No suspicious bone lesion. Ligaments Suboptimally assessed by CT.  Muscles and Tendons Musculotendinous structures appear within normal limits. Soft tissues There is mild induration within the subcutaneous soft tissues overlying the lateral aspect of the left hip. No organized soft tissue fluid collection or hematoma. Sigmoid diverticulosis. Atherosclerosis is seen. No left inguinal lymphadenopathy. IMPRESSION: 1. Acute minimally displaced fracture involving the tip of the left greater trochanter. 2. Status post prior left hip ORIF with 3 cannulated partially threaded screws. Hardware alignment remains unchanged from prior. 3. Mild induration within the subcutaneous soft tissues overlying the lateral aspect of the left hip without organized soft tissue fluid collection or hematoma. Electronically Signed   By: Davina Poke D.O.   On: 06/11/2020 08:05   DG Hip Unilat W or Wo Pelvis 2-3 Views Left  Result Date: 06/10/2020 CLINICAL DATA:  Fall left hip pain EXAM: DG HIP (WITH OR WITHOUT PELVIS) 2-3V LEFT COMPARISON:  June 21, 2017 FINDINGS: The patient is status post screw fixation of the proximal left femur. There appears to be in acute intratrochanteric fracture with mildly displaced fractures through the greater trochanter. Overlying soft tissue swelling is seen. Healed fracture deformity seen at the inferior right pubic rami. There is diffuse osteopenia. IMPRESSION: Acute left intratrochanteric femur fracture Electronically Signed   By: Prudencio Pair M.D.   On: 06/10/2020 21:19    Review of  Systems  HENT: Negative for ear discharge, ear pain, hearing loss and tinnitus.   Eyes: Negative for photophobia and pain.  Respiratory: Negative for cough and shortness of breath.   Cardiovascular: Negative for chest pain.  Gastrointestinal: Negative for abdominal pain, nausea and vomiting.  Genitourinary: Negative for dysuria, flank pain, frequency and urgency.  Musculoskeletal: Positive for arthralgias (Left wrist, hip). Negative for back pain, myalgias and neck pain.  Neurological: Negative for dizziness and headaches.  Hematological: Does not bruise/bleed easily.  Psychiatric/Behavioral: The patient is not nervous/anxious.    Blood pressure (!) 150/85, pulse 83, temperature 99.1 F (37.3 C), temperature source Oral, resp. rate 20, SpO2 98 %. Physical Exam Constitutional:      General: She is not in acute distress.    Appearance: She is well-developed. She is not diaphoretic.  HENT:     Head: Normocephalic and atraumatic.  Eyes:     General: No scleral icterus.       Right eye: No discharge.        Left eye: No discharge.     Conjunctiva/sclera: Conjunctivae normal.  Cardiovascular:     Rate and Rhythm: Normal rate and regular rhythm.  Pulmonary:     Effort: Pulmonary effort is normal. No respiratory distress.  Musculoskeletal:     Cervical back: Normal range of motion.     Comments: Left shoulder, elbow, wrist, digits- no skin wounds, sugar tong splint in place, nontender, no instability, no blocks to motion  Sens  Ax/R/M/U grossly intact  Mot   Ax/ R/ PIN/ M/ AIN/ U grossly intact  Fingers perfused  LLE No traumatic wounds, ecchymosis, or rash  Mild TTP hip  No knee or ankle effusion  Knee stable to varus/ valgus and anterior/posterior stress  Sens DPN, SPN, TN intact  Motor EHL, ext, flex, evers 5/5  DP 1+, PT 2+, No significant edema  Skin:    General: Skin is warm and dry.  Neurological:     Mental Status: She is alert.  Psychiatric:        Behavior: Behavior  normal.     Assessment/Plan: Left wrist fx -- Non-operative management in splint, NWB.  F/u with Dr. Doreatha Martin in 2 weeks. Left greater trochanter fx -- Non-operative management with WBAT. Multiple medical problems including lung cancer, HTN, HLD, GERD, and COPD -- per primary service    Lisette Abu, PA-C Orthopedic Surgery [phone] 06/11/2020, 8:35 AM

## 2020-06-11 NOTE — H&P (View-Only) (Signed)
Reason for Consult:Left wrist, hip fxs Referring Physician: Marlowe Sax  [name] is an 79 y.o. female.  HPI: [name] was at home when she slipped on one of her grandchildren's balls and fell. She fell onto her left side and had immediate wrist and hip pain. She could not get up or bear weight. She was brought to the ED where x-rays showed left wrist and hip fxs and orthopedic surgery was consulted. She c/o localized pain to those areas.  Past Medical History:  Diagnosis Date  . Adenocarcinoma of right lung, stage 1 ([place]) 07/28/2016  . Anxiety   . Asthma   . Cancer ([place])    uterine  . COPD (chronic obstructive pulmonary disease) ([place])   . Coronary artery disease    mild nonobstructive by 2019 cath  . Cyst    left side of neck  . Depression   . GERD (gastroesophageal reflux disease)   . Heart murmur   . Hyperlipidemia   . Hypertension   . Low back pain   . Osteoarthritis   . Osteoporosis   . Takotsubo cardiomyopathy    EF recovered to 50-55% by 01/23/18 echo    Past Surgical History:  Procedure Laterality Date  . ANKLE SURGERY Right    horse accident  . APPENDECTOMY    . BACK SURGERY    . CHOLECYSTECTOMY    . EYE SURGERY     lense implant  . FRACTURE SURGERY    . HIP SURGERY  01/2010   Left Hip   . INNER EAR SURGERY Bilateral 1970   related to severe ear infections  . KNEE SURGERY     Right knee  . LEFT HEART CATH AND CORONARY ANGIOGRAPHY N/A 12/11/2017   Procedure: LEFT HEART CATH AND CORONARY ANGIOGRAPHY;  Surgeon: Jettie Booze, MD;  Location: [facility];  Service: Cardiovascular;  Laterality: N/A;  . LOBECTOMY Right 05/09/2016   Procedure: RIGHT UPPER LOBECTOMY;  Surgeon: [name], MD;  Location: [facility];  Service: Thoracic;  Laterality: Right;  . MASS EXCISION  08/25/2011   Procedure: EXCISION MASS;  Surgeon: Harl Bowie, MD;  Location: [facility];  Service: General;  Laterality: N/A;  excision left neck mass  . TOTAL  ABDOMINAL HYSTERECTOMY    . TOTAL KNEE ARTHROPLASTY Right 05/27/2019   Procedure: TOTAL KNEE ARTHROPLASTY;  Surgeon: Vickey Huger, MD;  Location: [facility];  Service: Orthopedics;  Laterality: Right;  75 mins needed for length of case  . TOTAL KNEE ARTHROPLASTY Left 12/30/2019   Procedure: TOTAL KNEE ARTHROPLASTY;  Surgeon: Vickey Huger, MD;  Location: [facility];  Service: Orthopedics;  Laterality: Left;  Marland Kitchen VIDEO ASSISTED THORACOSCOPY (VATS)/WEDGE RESECTION Right 05/09/2016   Procedure: VIDEO ASSISTED THORACOSCOPY (VATS)/WEDGE RESECTION;  Surgeon: [name], MD;  Location: [facility];  Service: Thoracic;  Laterality: Right;    Family History  Problem Relation Age of Onset  . Heart disease Mother   . Heart disease Father   . Cancer Sister        #1, breast  . Heart disease Brother        had CABG    Social History:  reports that she quit smoking about 8 years ago. Her smoking use included cigarettes. She has a 102.00 pack-year smoking history. She has never used smokeless tobacco. She reports that she does not drink alcohol and does not use drugs.  Allergies:  Allergies  Allergen Reactions  . Boniva [Ibandronate Sodium] Other (See  Comments)    UNSPECIFIED REACTION   . Lyrica [Pregabalin] Nausea And Vomiting    Dizziness and blurred vision as well  . Levofloxacin     Pt states she isn't sure     Medications: I have reviewed the patient's current medications.  Results for orders placed or performed during the hospital encounter of 06/10/20 (from the past 48 hour(s))  CBC     Status: Abnormal   Collection Time: 06/10/20  9:22 PM  Result Value Ref Range   WBC 11.7 (H) 4.0 - 10.5 K/uL   RBC 3.69 (L) 3.87 - 5.11 MIL/uL   Hemoglobin 9.3 (L) 12.0 - 15.0 g/dL   HCT 32.0 (L) 36 - 46 %   MCV 86.7 80.0 - 100.0 fL   MCH 25.2 (L) 26.0 - 34.0 pg   MCHC 29.1 (L) 30.0 - 36.0 g/dL   RDW 19.3 (H) 11.5 - 15.5 %   Platelets 301 150 - 400 K/uL   nRBC 0.0 0.0 - 0.2 %    Comment: Performed at  [facility], [address], [place], [place] [address]  Basic metabolic panel     Status: Abnormal   Collection Time: 06/10/20  9:22 PM  Result Value Ref Range   Sodium 141 135 - 145 mmol/L   Potassium 5.5 (H) 3.5 - 5.1 mmol/L   Chloride 104 98 - 111 mmol/L   CO2 25 22 - 32 mmol/L   Glucose, Bld 114 (H) 70 - 99 mg/dL    Comment: Glucose reference range applies only to samples taken after fasting for at least 8 hours.   BUN 22 8 - 23 mg/dL   Creatinine, Ser 1.55 (H) 0.44 - 1.00 mg/dL   Calcium 8.7 (L) 8.9 - 10.3 mg/dL   GFR, Estimated 34 (L) >60 mL/min    Comment: (NOTE) Calculated using the CKD-EPI Creatinine Equation (2021)    Anion gap 12 5 - 15    Comment: Performed at [facility] [address] [place], [place] [address]  Respiratory Panel by RT PCR (Flu A&B, Covid) - Nasopharyngeal Swab     Status: None   Collection Time: 06/10/20 11:18 PM   Specimen: Nasopharyngeal Swab  Result Value Ref Range   SARS Coronavirus 2 by RT PCR NEGATIVE NEGATIVE    Comment: (NOTE) SARS-CoV-2 target nucleic acids are NOT DETECTED.  The SARS-CoV-2 RNA is generally detectable in upper respiratoy specimens during the acute phase of infection. The lowest concentration of SARS-CoV-2 viral copies this assay can detect is 131 copies/mL. A negative result does not preclude SARS-Cov-2 infection and should not be used as the sole basis for treatment or other patient management decisions. A negative result may occur with  improper specimen collection/handling, submission of specimen other than nasopharyngeal swab, presence of viral mutation(s) within the areas targeted by this assay, and inadequate number of viral copies (<131 copies/mL). A negative result must be combined with clinical observations, patient history, and epidemiological information. The expected result is Negative.  Fact Sheet for Patients:  PinkCheek.be  Fact Sheet for Healthcare  Providers:  GravelBags.it  This test is no t yet approved or cleared by the [place] FDA and  has been authorized for detection and/or diagnosis of SARS-CoV-2 by FDA under an Emergency Use Authorization (EUA). This EUA will remain  in effect (meaning this test can be used) for the duration of the COVID-19 declaration under Section 564(b)(1) of the Act, 21 U.S.C. section 360bbb-3(b)(1), unless the authorization is  terminated or revoked sooner.     Influenza A by PCR NEGATIVE NEGATIVE   Influenza B by PCR NEGATIVE NEGATIVE    Comment: (NOTE) The Xpert Xpress SARS-CoV-2/FLU/RSV assay is intended as an aid in  the diagnosis of influenza from Nasopharyngeal swab specimens and  should not be used as a sole basis for treatment. Nasal washings and  aspirates are unacceptable for Xpert Xpress SARS-CoV-2/FLU/RSV  testing.  Fact Sheet for Patients: PinkCheek.be  Fact Sheet for Healthcare Providers: GravelBags.it  This test is not yet approved or cleared by the [place] FDA and  has been authorized for detection and/or diagnosis of SARS-CoV-2 by  FDA under an Emergency Use Authorization (EUA). This EUA will remain  in effect (meaning this test can be used) for the duration of the  Covid-19 declaration under Section 564(b)(1) of the Act, 21  U.S.C. section 360bbb-3(b)(1), unless the authorization is  terminated or revoked. Performed at [facility], [place] [address] [place], [place] [address]     DG Wrist Complete Left  Result Date: 06/10/2020 CLINICAL DATA:  79 year old female with fall and left wrist pain. EXAM: LEFT WRIST - COMPLETE 3+ VIEW COMPARISON:  Left hand radiograph dated 10/10/2018. FINDINGS: There is a mildly displaced and impacted fracture of the distal radial metaphysis with approximately 4 mm dorsal displacement and angulation of the distal fracture fragment. Probable  nondisplaced fracture of the ulnar styloid. There is no dislocation. There is widening of the scapholunate distance. Advanced osteopenia. The soft tissue swelling of the wrist. IMPRESSION: Mildly displaced and angulated fracture of the distal radial metaphysis and probable nondisplaced fracture of the ulnar styloid. Electronically Signed   By: Anner Crete M.D.   On: 06/10/2020 17:15   CT HEAD WO CONTRAST  Result Date: 06/10/2020 CLINICAL DATA:  Fall facial trauma EXAM: CT HEAD WITHOUT CONTRAST TECHNIQUE: Contiguous axial images were obtained from the base of the skull through the vertex without intravenous contrast. COMPARISON:  September 14, 2019 FINDINGS: Brain: No evidence of acute territorial infarction, hemorrhage, hydrocephalus,extra-axial collection or mass lesion/mass effect. There is dilatation the ventricles and sulci consistent with age-related atrophy. Low-attenuation changes in the deep white matter consistent with small vessel ischemia. Vascular: No hyperdense vessel or unexpected calcification. Skull: The skull is intact. No fracture or focal lesion identified. Sinuses/Orbits: The visualized paranasal sinuses and mastoid air cells are clear. The orbits and globes intact. Other: None IMPRESSION: No acute intracranial abnormality. Findings consistent with age related atrophy and chronic small vessel ischemia Electronically Signed   By: Prudencio Pair M.D.   On: 06/10/2020 21:26   CT HIP LEFT WO CONTRAST  Result Date: 06/11/2020 CLINICAL DATA:  Evaluate left hip fracture EXAM: CT OF THE LEFT HIP WITHOUT CONTRAST TECHNIQUE: Multidetector CT imaging of the left hip was performed according to the standard protocol. Multiplanar CT image reconstructions were also generated. COMPARISON:  X-ray 06/10/2020 FINDINGS: Bones/Joint/Cartilage Patient is status post prior left hip ORIF via 3 cannulated partially threaded screws. Hardware alignment remains unchanged from prior. There is an acute fracture  involving the tip of the left greater trochanter with minimal medial displacement (series 3, images 55-60; series 7, image 63). Fracture lines do not appear to extend into the intertrochanteric portion of the femur. Fracture lines do not appear to definitively extend to the adjacent hardware. Hip joint is intact without dislocation. The visualized portion of the left hemipelvis is intact. No left SI joint diastasis. No suspicious bone lesion. Ligaments Suboptimally assessed by CT.  Muscles and Tendons Musculotendinous structures appear within normal limits. Soft tissues There is mild induration within the subcutaneous soft tissues overlying the lateral aspect of the left hip. No organized soft tissue fluid collection or hematoma. Sigmoid diverticulosis. Atherosclerosis is seen. No left inguinal lymphadenopathy. IMPRESSION: 1. Acute minimally displaced fracture involving the tip of the left greater trochanter. 2. Status post prior left hip ORIF with 3 cannulated partially threaded screws. Hardware alignment remains unchanged from prior. 3. Mild induration within the subcutaneous soft tissues overlying the lateral aspect of the left hip without organized soft tissue fluid collection or hematoma. Electronically Signed   By: Davina Poke D.O.   On: 06/11/2020 08:05   DG Hip Unilat W or Wo Pelvis 2-3 Views Left  Result Date: 06/10/2020 CLINICAL DATA:  Fall left hip pain EXAM: DG HIP (WITH OR WITHOUT PELVIS) 2-3V LEFT COMPARISON:  June 21, 2017 FINDINGS: The patient is status post screw fixation of the proximal left femur. There appears to be in acute intratrochanteric fracture with mildly displaced fractures through the greater trochanter. Overlying soft tissue swelling is seen. Healed fracture deformity seen at the inferior right pubic rami. There is diffuse osteopenia. IMPRESSION: Acute left intratrochanteric femur fracture Electronically Signed   By: Prudencio Pair M.D.   On: 06/10/2020 21:19    Review of  Systems  HENT: Negative for ear discharge, ear pain, hearing loss and tinnitus.   Eyes: Negative for photophobia and pain.  Respiratory: Negative for cough and shortness of breath.   Cardiovascular: Negative for chest pain.  Gastrointestinal: Negative for abdominal pain, nausea and vomiting.  Genitourinary: Negative for dysuria, flank pain, frequency and urgency.  Musculoskeletal: Positive for arthralgias (Left wrist, hip). Negative for back pain, myalgias and neck pain.  Neurological: Negative for dizziness and headaches.  Hematological: Does not bruise/bleed easily.  Psychiatric/Behavioral: The patient is not nervous/anxious.    Blood pressure (!) 150/85, pulse 83, temperature 99.1 F (37.3 C), temperature source Oral, resp. rate 20, SpO2 98 %. Physical Exam Constitutional:      General: She is not in acute distress.    Appearance: She is well-developed. She is not diaphoretic.  HENT:     Head: Normocephalic and atraumatic.  Eyes:     General: No scleral icterus.       Right eye: No discharge.        Left eye: No discharge.     Conjunctiva/sclera: Conjunctivae normal.  Cardiovascular:     Rate and Rhythm: Normal rate and regular rhythm.  Pulmonary:     Effort: Pulmonary effort is normal. No respiratory distress.  Musculoskeletal:     Cervical back: Normal range of motion.     Comments: Left shoulder, elbow, wrist, digits- no skin wounds, sugar tong splint in place, nontender, no instability, no blocks to motion  Sens  Ax/R/M/U grossly intact  Mot   Ax/ R/ PIN/ M/ AIN/ U grossly intact  Fingers perfused  LLE No traumatic wounds, ecchymosis, or rash  Mild TTP hip  No knee or ankle effusion  Knee stable to varus/ valgus and anterior/posterior stress  Sens DPN, SPN, TN intact  Motor EHL, ext, flex, evers 5/5  DP 1+, PT 2+, No significant edema  Skin:    General: Skin is warm and dry.  Neurological:     Mental Status: She is alert.  Psychiatric:        Behavior: Behavior  normal.     Assessment/Plan: Left wrist fx -- Non-operative management in splint, NWB.  F/u with Dr. Doreatha Martin in 2 weeks. Left greater trochanter fx -- Non-operative management with WBAT. Multiple medical problems including lung cancer, HTN, HLD, GERD, and COPD -- per primary service    Lisette Abu, PA-C Orthopedic Surgery [phone] 06/11/2020, 8:35 AM

## 2020-06-11 NOTE — Progress Notes (Signed)
[facility] PLAN OF CARE NOTE Patient: [name] BOF:[id number]   PCP: Wenda Low, MD DOB: 13-Feb-1941   DOA: 06/10/2020   DOS: 06/11/2020    Patient was admitted by my colleague earlier on 06/11/2020. I have reviewed the H&P as well as assessment and plan and agree with the same. Important changes in the plan are listed below.  Plan of care: Active Problems:   S/p left hip fracture Acute kidney injury.  Continue with IV hydration although appears to be having some wheezing. Chest x-ray shows no evidence of edema or acute abnormality. We will continue to monitor. Albuterol nebulizations ordered as well. Continue inhalers.  Dementia. Seizure disorder age-related atrophy. At risk for delirium. Monitor postop.  Anemia. Likely dilutional in nature. Hemoglobin 8.3. On presentation 9.3. Repeat 7.8. Continue to monitor twice daily. Transfuse hemoglobin less than 7. Postop transfuse for hemoglobin less than 8.  Author: Berle Mull, MD [facility] 06/11/2020 6:24 PM   If 7PM-7AM, please contact night-coverage at [email or web address]

## 2020-06-11 NOTE — Progress Notes (Signed)
Received patient from ED, AOx4, VSS with elevated BP at 150/85 d/t pain at 8/10 after bed transfer.  Oriented to room, bed controls, call light and plan of care.  Paged [facility] on-call for orders.  Will monitor.

## 2020-06-11 NOTE — ED Notes (Signed)
RN has called ortho tech and informed this of ortho order

## 2020-06-12 ENCOUNTER — Inpatient Hospital Stay (HOSPITAL_COMMUNITY): Payer: Medicare Other | Admitting: Anesthesiology

## 2020-06-12 ENCOUNTER — Encounter (HOSPITAL_COMMUNITY): Payer: Self-pay | Admitting: Internal Medicine

## 2020-06-12 ENCOUNTER — Inpatient Hospital Stay (HOSPITAL_COMMUNITY): Payer: Medicare Other

## 2020-06-12 ENCOUNTER — Encounter (HOSPITAL_COMMUNITY)
Admission: EM | Disposition: A | Discharge status: Skilled Nursing Facility | Payer: Self-pay | Source: Home / Self Care | Attending: Internal Medicine

## 2020-06-12 DIAGNOSIS — S72102A Unspecified trochanteric fracture of left femur, initial encounter for closed fracture: Secondary | ICD-10-CM | POA: Diagnosis not present

## 2020-06-12 HISTORY — PX: OPEN REDUCTION INTERNAL FIXATION (ORIF) DISTAL RADIAL FRACTURE: SHX5989

## 2020-06-12 LAB — HEMOGLOBIN AND HEMATOCRIT, BLOOD
HCT: 25 % — ABNORMAL LOW (ref 36.0–46.0)
Hemoglobin: 7.4 g/dL — ABNORMAL LOW (ref 12.0–15.0)

## 2020-06-12 LAB — CBC
HCT: 24.5 % — ABNORMAL LOW (ref 36.0–46.0)
HCT: 29.6 % — ABNORMAL LOW (ref 36.0–46.0)
Hemoglobin: 7.4 g/dL — ABNORMAL LOW (ref 12.0–15.0)
Hemoglobin: 9 g/dL — ABNORMAL LOW (ref 12.0–15.0)
MCH: 26.1 pg (ref 26.0–34.0)
MCH: 25.8 pg — ABNORMAL LOW (ref 26.0–34.0)
MCHC: 30.2 g/dL (ref 30.0–36.0)
MCHC: 30.4 g/dL (ref 30.0–36.0)
MCV: 86.6 fL (ref 80.0–100.0)
MCV: 84.8 fL (ref 80.0–100.0)
Platelets: 209 10*3/uL (ref 150–400)
Platelets: 225 10*3/uL (ref 150–400)
RBC: 2.83 MIL/uL — ABNORMAL LOW (ref 3.87–5.11)
RBC: 3.49 MIL/uL — ABNORMAL LOW (ref 3.87–5.11)
RDW: 19.6 % — ABNORMAL HIGH (ref 11.5–15.5)
RDW: 17.9 % — ABNORMAL HIGH (ref 11.5–15.5)
WBC: 5.7 10*3/uL (ref 4.0–10.5)
WBC: 8 10*3/uL (ref 4.0–10.5)
nRBC: 0 % (ref 0.0–0.2)
nRBC: 0.2 % (ref 0.0–0.2)

## 2020-06-12 LAB — BASIC METABOLIC PANEL
Anion gap: 7 (ref 5–15)
BUN: 20 mg/dL (ref 8–23)
CO2: 23 mmol/L (ref 22–32)
Calcium: 8.3 mg/dL — ABNORMAL LOW (ref 8.9–10.3)
Chloride: 110 mmol/L (ref 98–111)
Creatinine, Ser: 1.51 mg/dL — ABNORMAL HIGH (ref 0.44–1.00)
GFR, Estimated: 35 mL/min — ABNORMAL LOW (ref >60)
Glucose, Bld: 91 mg/dL (ref 70–99)
Potassium: 5.1 mmol/L (ref 3.5–5.1)
Sodium: 140 mmol/L (ref 135–145)

## 2020-06-12 LAB — PREPARE RBC (CROSSMATCH)

## 2020-06-12 IMAGING — RF DG C-ARM 1-60 MIN
1 series · 5 of 5 positions shown · non-contrast
Comparison: None.

CLINICAL DATA: ORIF of left wrist.

EXAM:
DG C-ARM 1-60 MIN
FLUOROSCOPY TIME:  Fluoroscopy Time:  33 seconds
Radiation Exposure Index (if provided by the fluoroscopic device):
0.57 mGy
Number of Acquired Spot Images: 5

[Series 1: run · 5 of 5 slices shown]
[im 1/5]
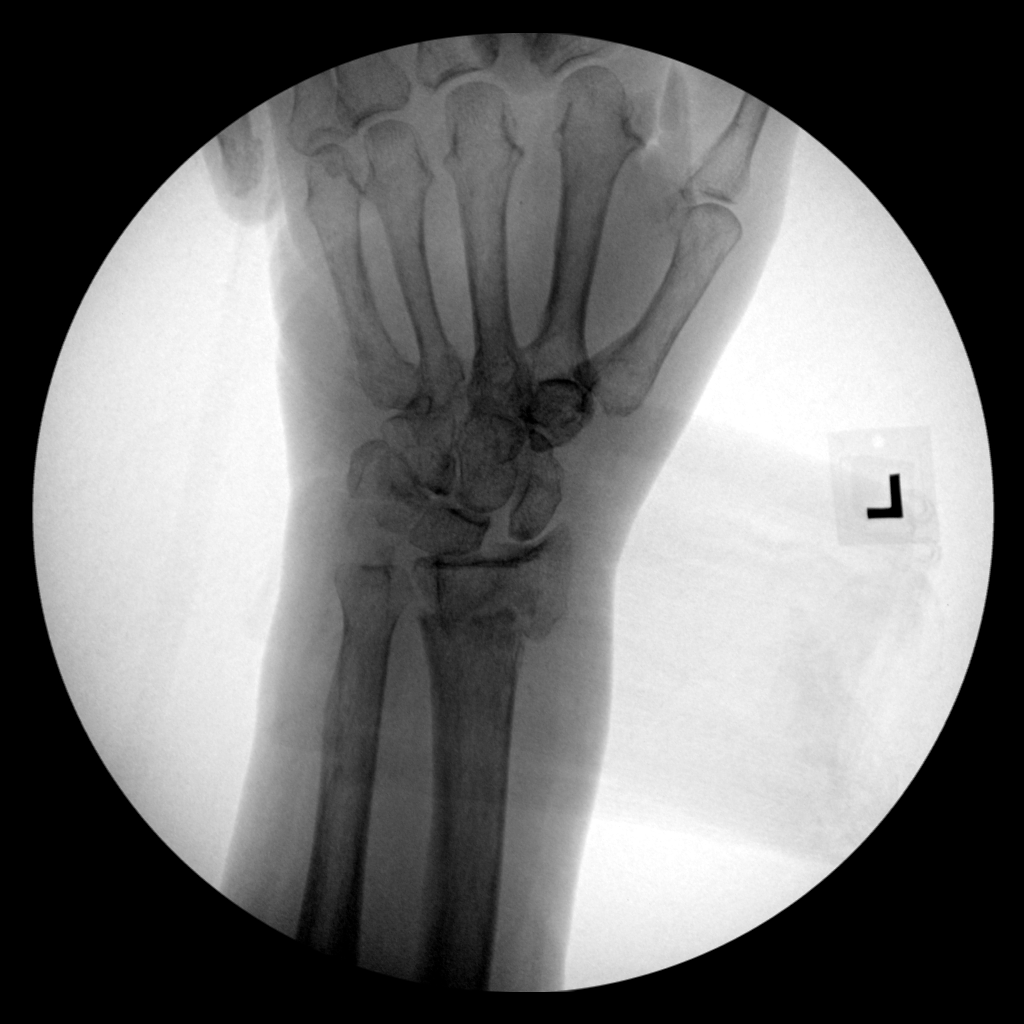
[im 2/5]
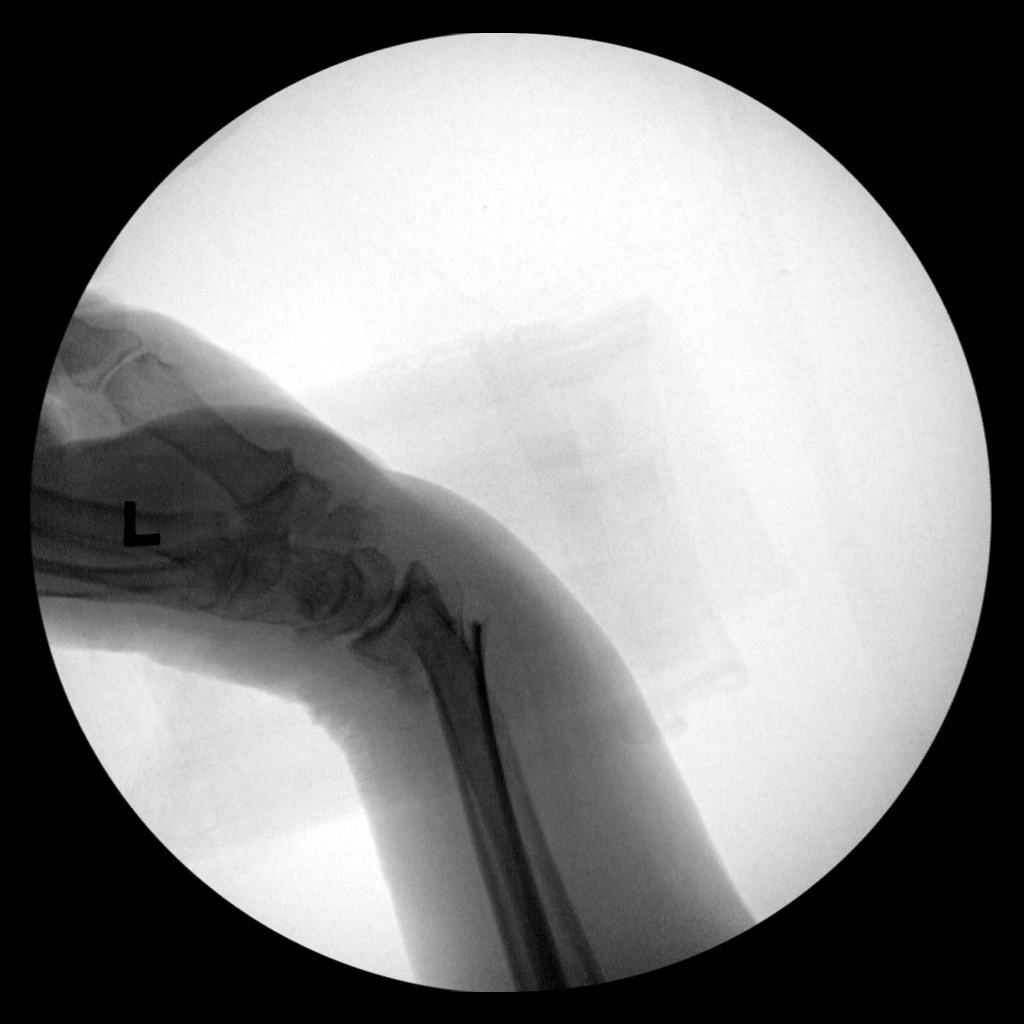
[im 3/5]
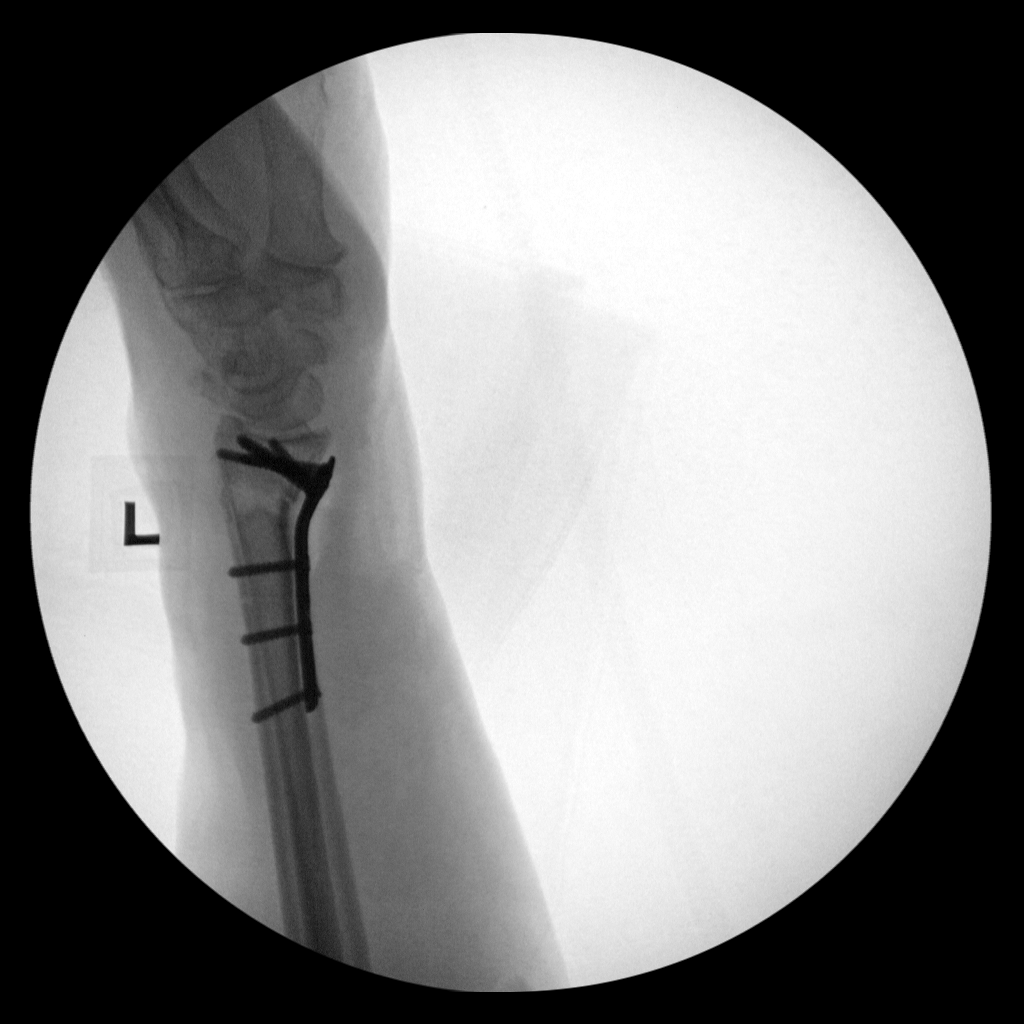
[im 4/5]
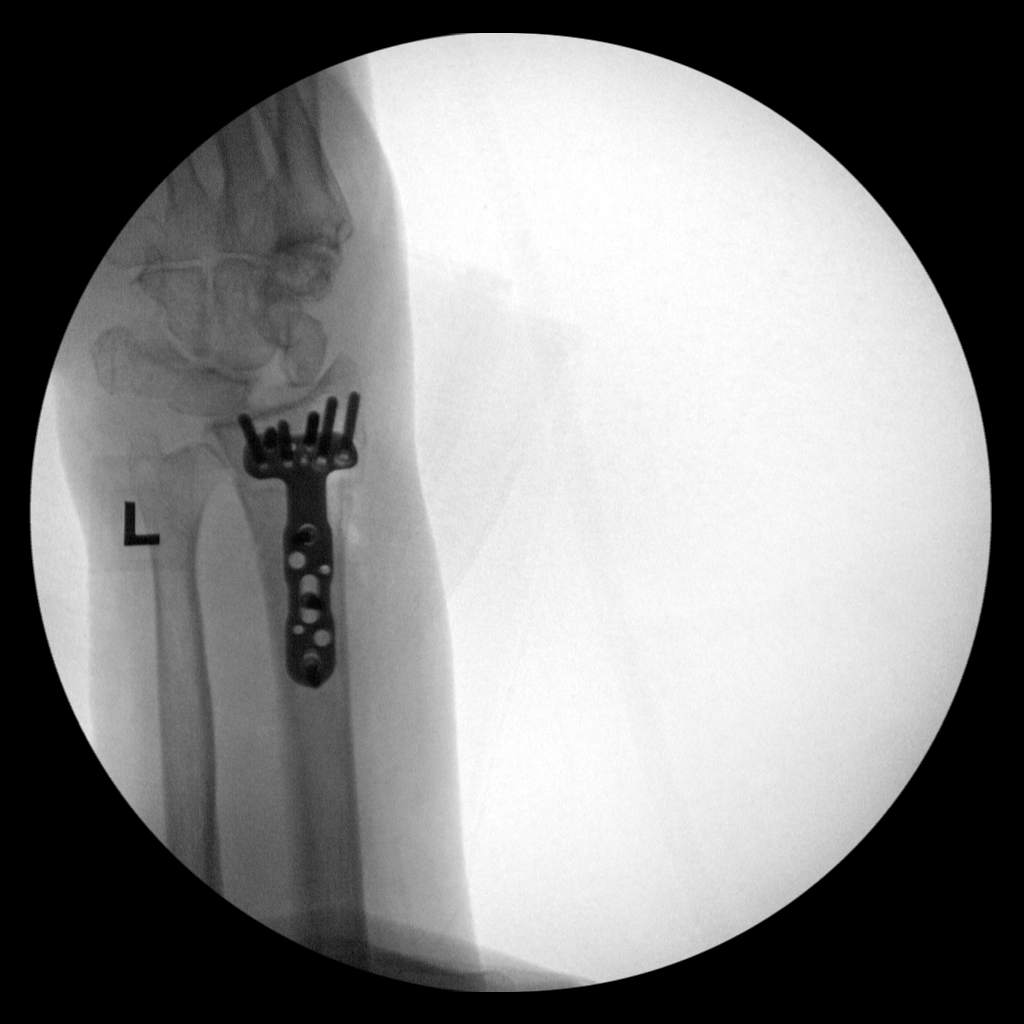
[im 5/5]
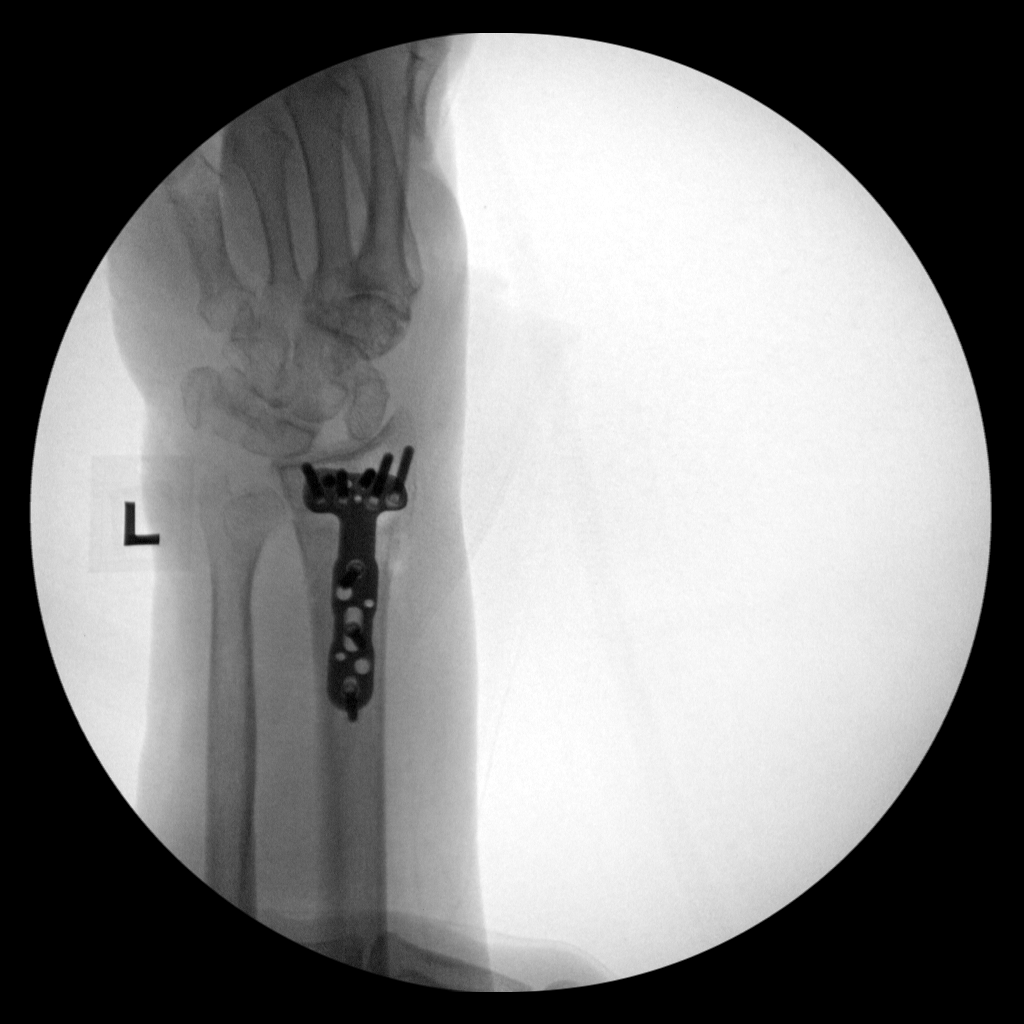

[5 of 5 positions shown; findings below may reference images not displayed]

FINDINGS: Five images were obtained during ORIF of the distal radial fracture.
Hardware is in good position by the end of the study. Alignment
appears to be nearly anatomic by the end of the study.
IMPRESSION: Left radius ORIF as above.

## 2020-06-12 IMAGING — RF DG WRIST COMPLETE 3+V*L*
1 series · 5 of 5 positions shown · non-contrast
Comparison: None.

CLINICAL DATA: ORIF of left wrist.

FLUOROSCOPY TIME:  33 seconds.
Images: 5
EXAM:
LEFT WRIST - COMPLETE 3+ VIEW

[Series 1: run · 5 of 5 slices shown]
[im 1/5]
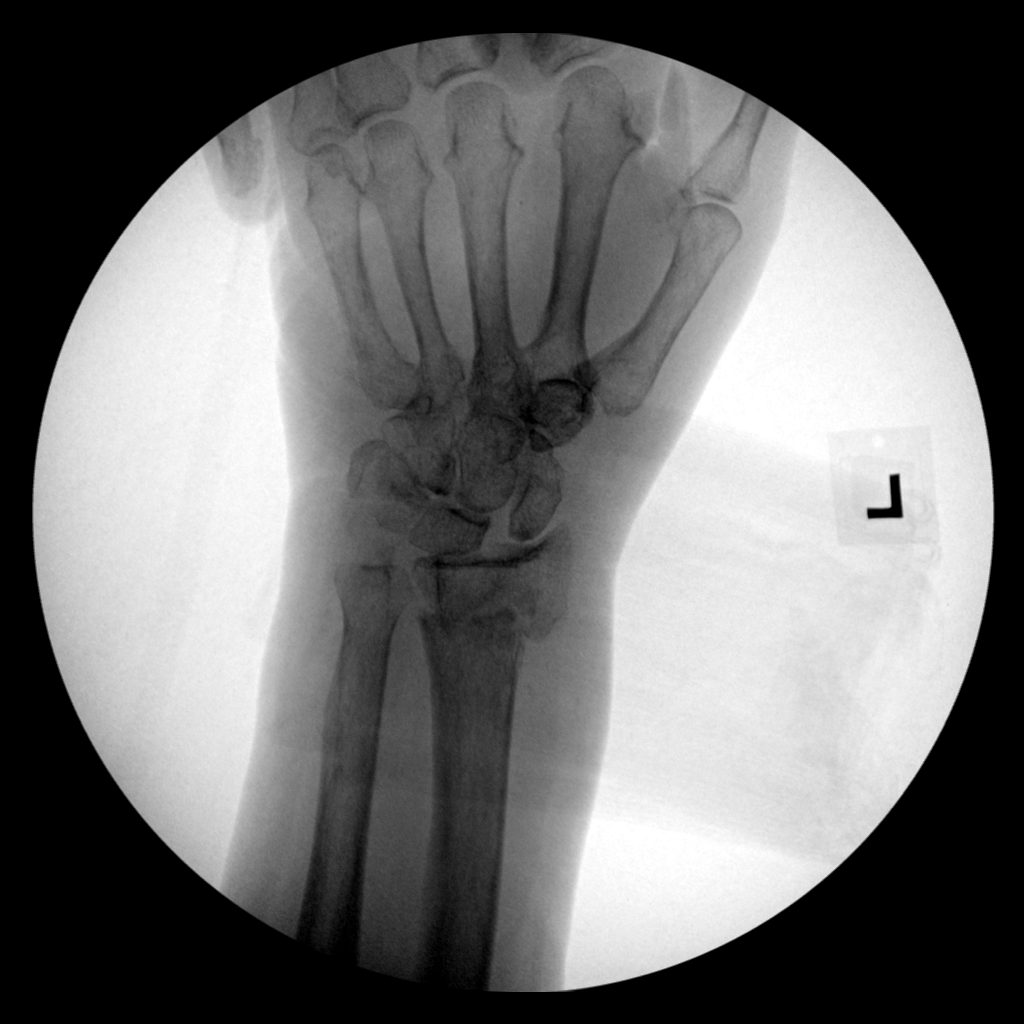
[im 2/5]
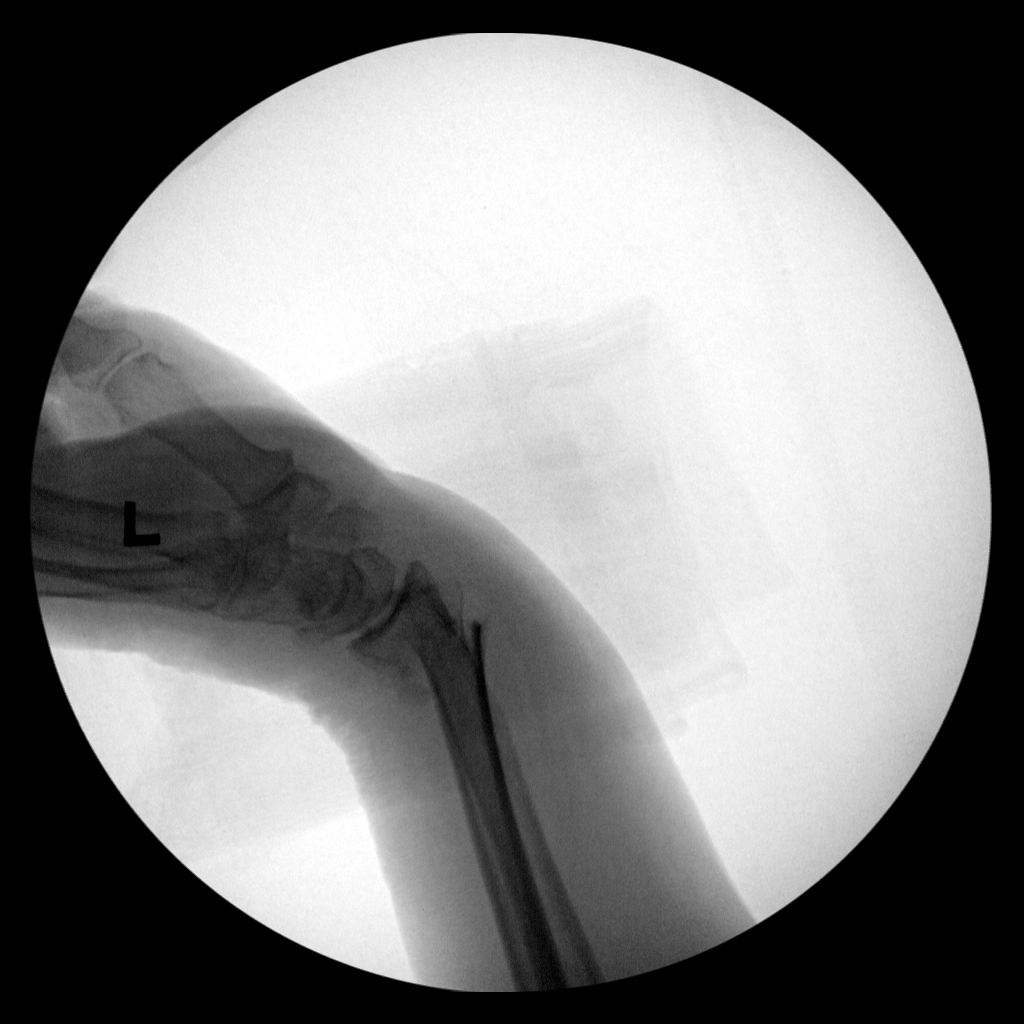
[im 3/5]
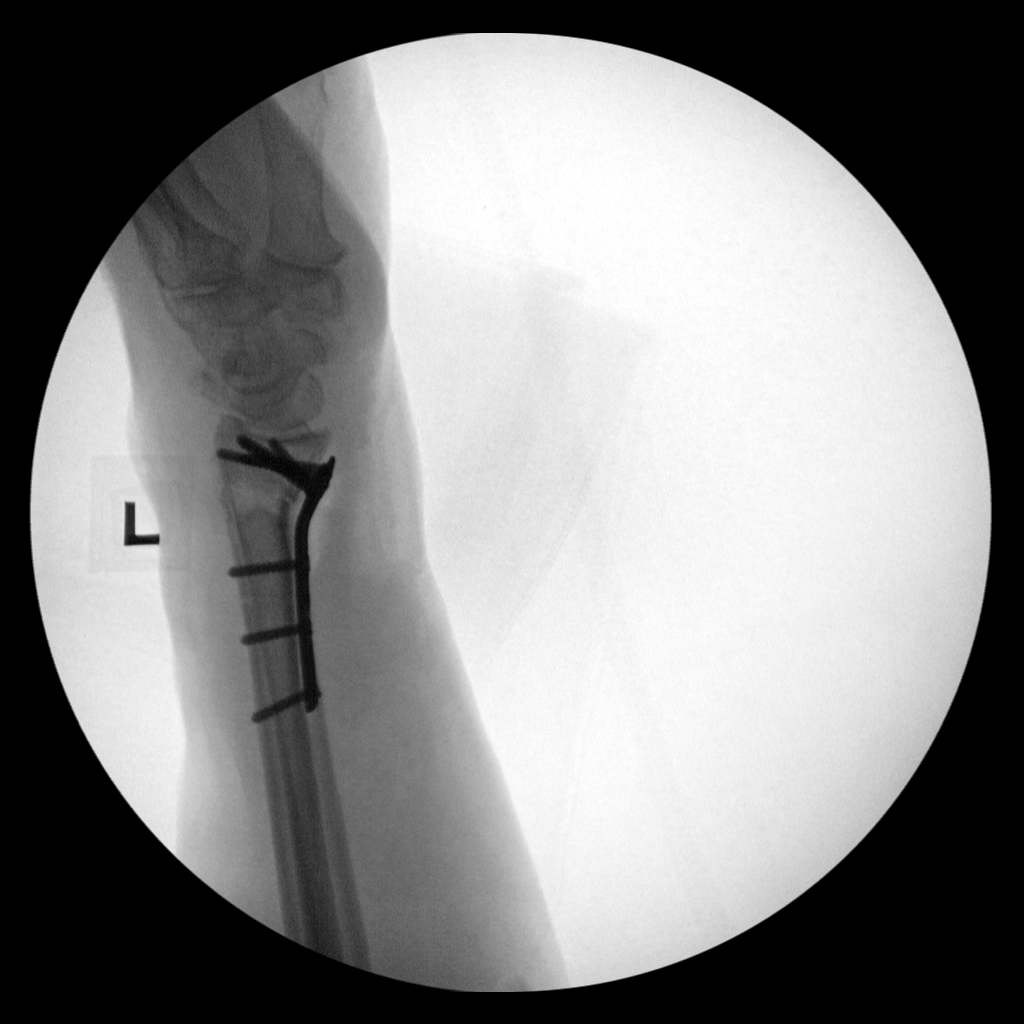
[im 4/5]
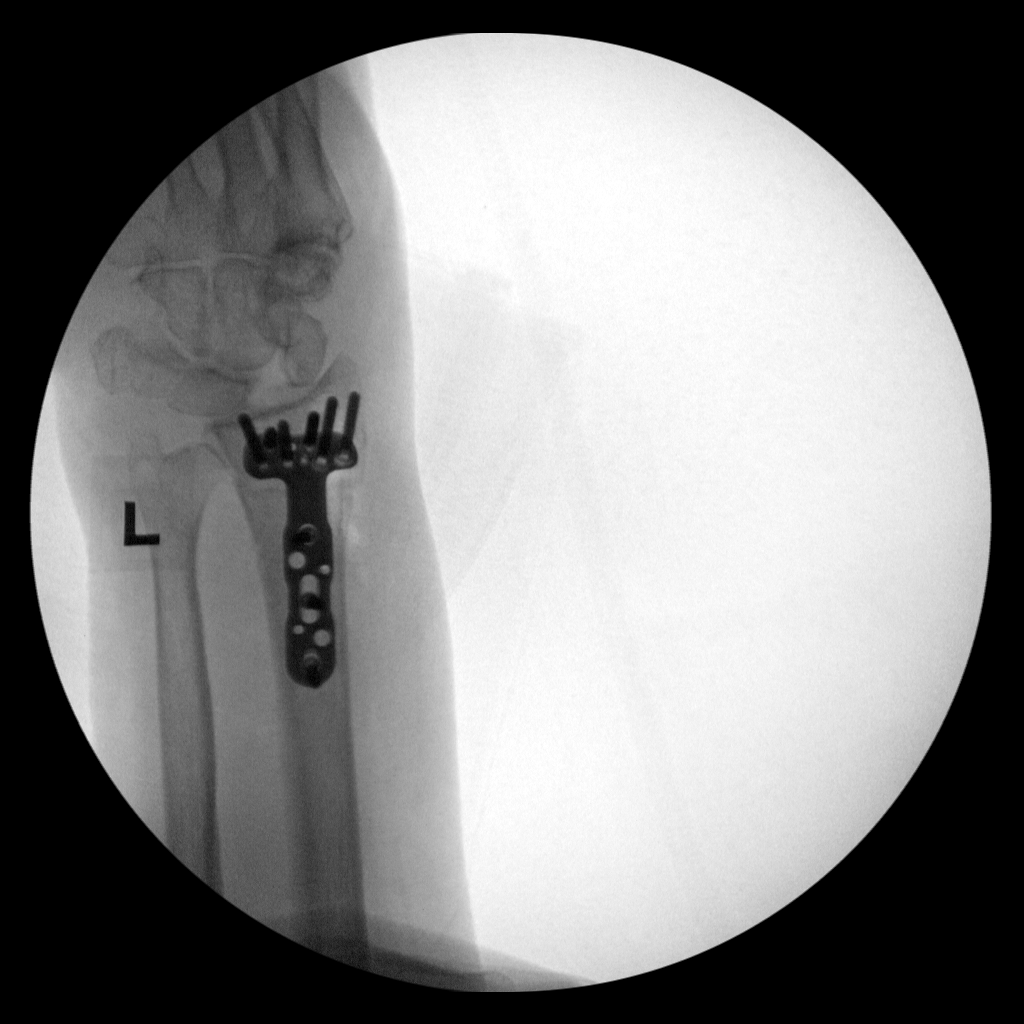
[im 5/5]
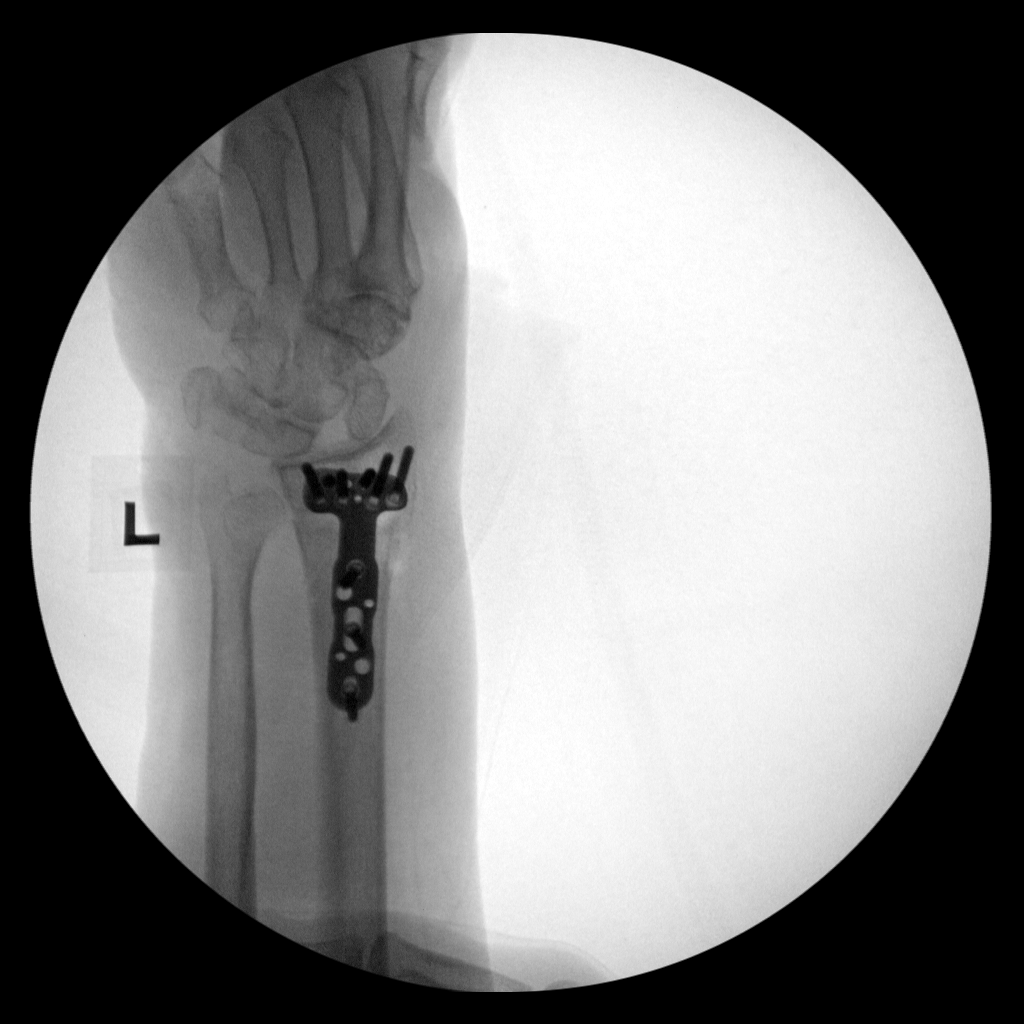

[5 of 5 positions shown; findings below may reference images not displayed]

FINDINGS: Five images were obtained during left wrist ORIF. By the end of the
study, hardware is in good position and alignment is near anatomic.
IMPRESSION: Left wrist ORIF as above.

## 2020-06-12 IMAGING — DX DG WRIST 2V*L*
2 series · 2 of 2 positions shown · non-contrast
Comparison: None.

CLINICAL DATA: ORIF of left distal radius fracture.

EXAM:
LEFT WRIST - 2 VIEW

[wrist ap]
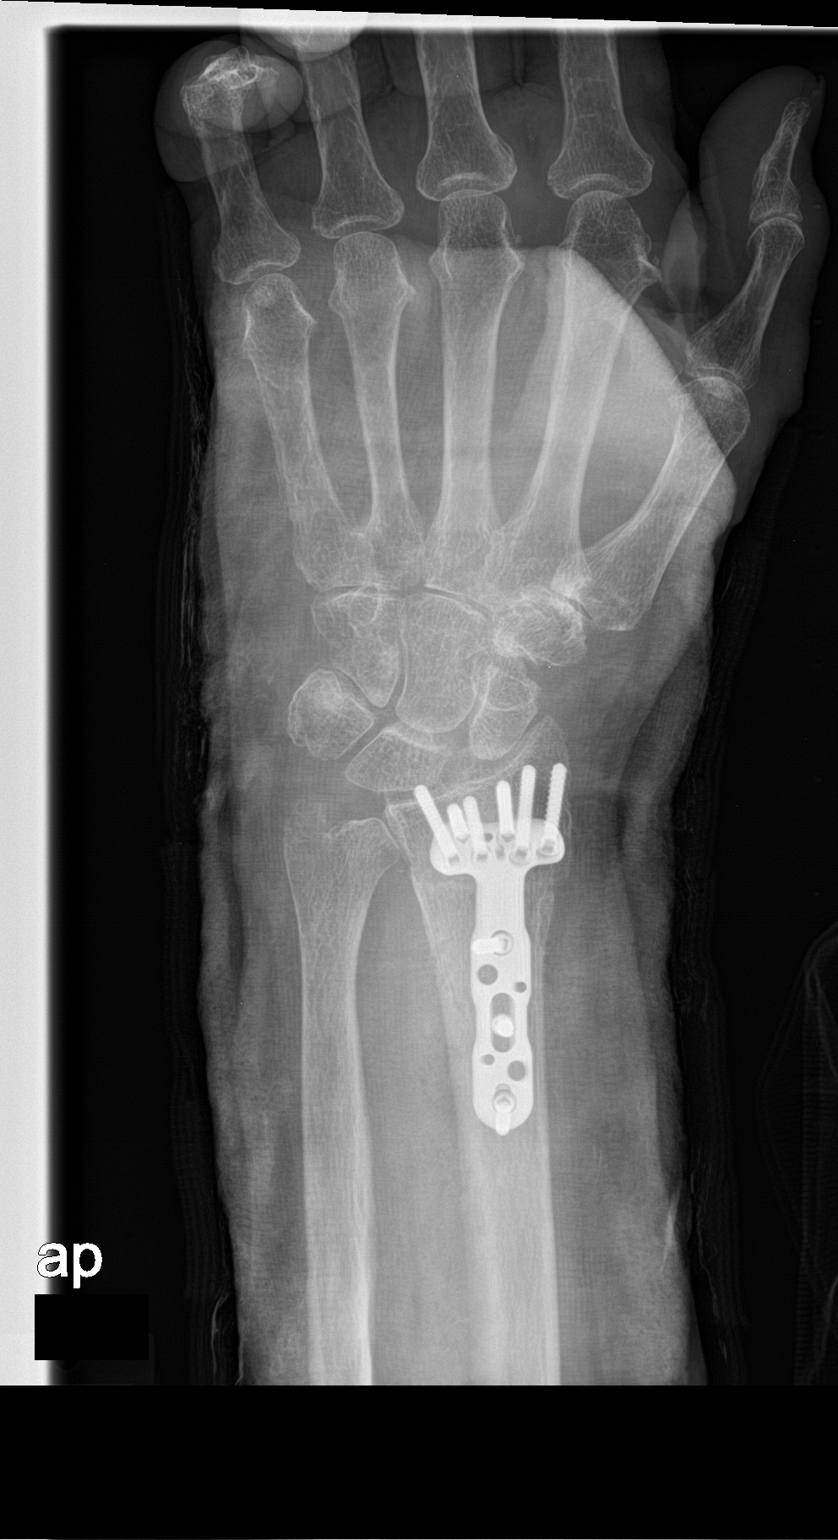

[wrist lat]
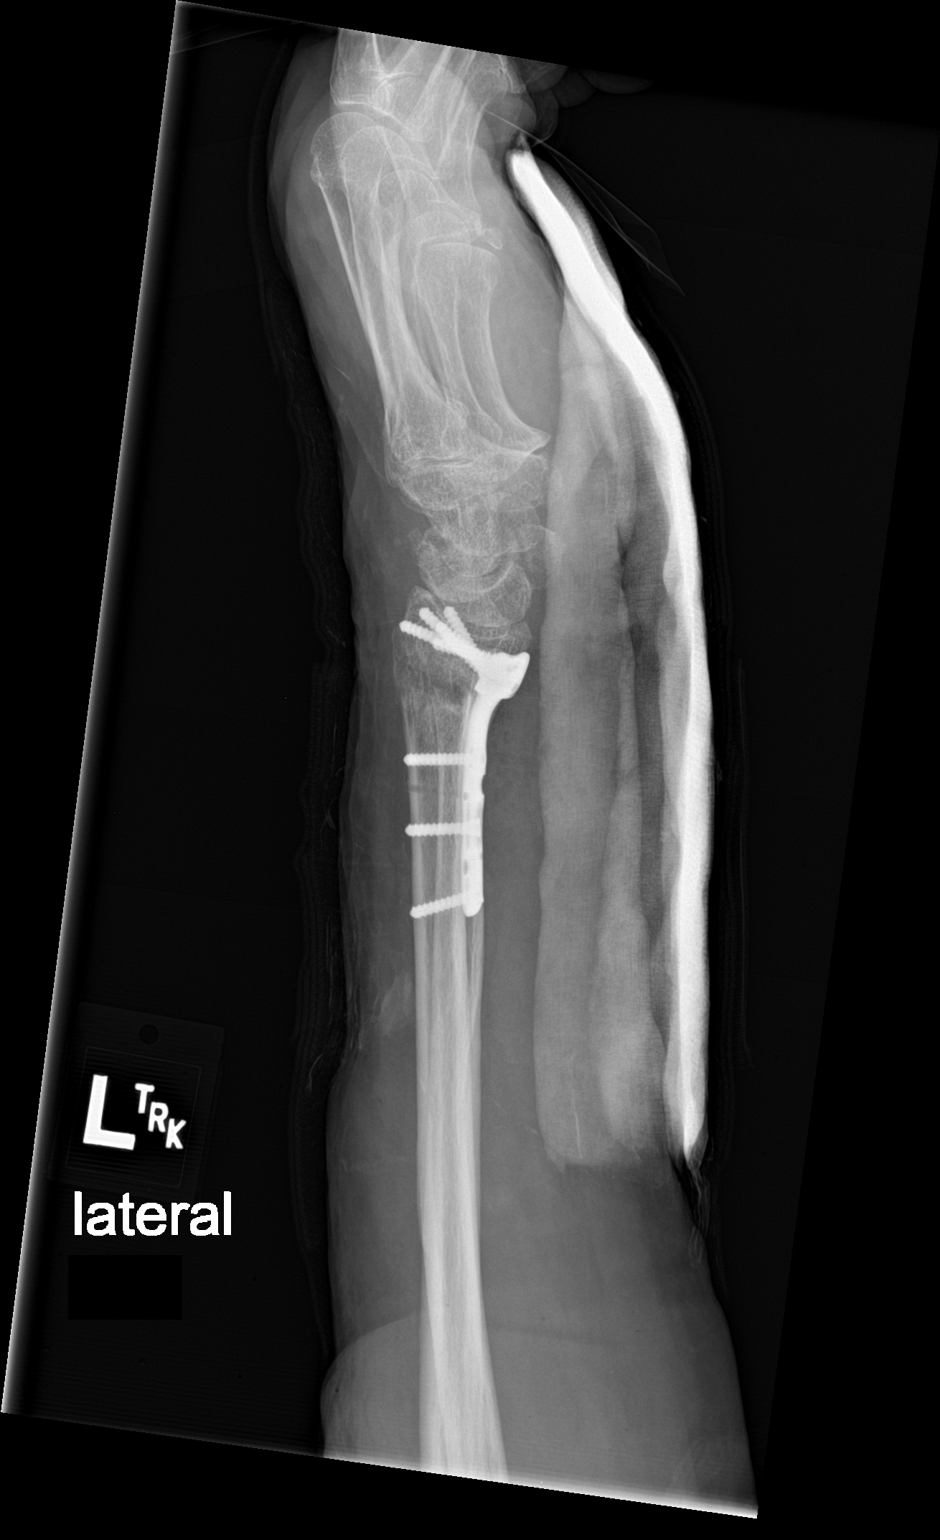

[2 of 2 positions shown; findings below may reference images not displayed]

FINDINGS: The patient is status post left distal radius fracture ORIF.
Hardware is in good position. Alignment is significantly improved.
IMPRESSION: ORIF left distal radius fracture.

## 2020-06-12 SURGERY — OPEN REDUCTION INTERNAL FIXATION (ORIF) DISTAL RADIUS FRACTURE
Anesthesia: Regional | Site: Wrist | Laterality: Left

## 2020-06-12 MED ORDER — FENTANYL CITRATE (PF) 250 MCG/5ML IJ SOLN
INTRAMUSCULAR | Status: DC | PRN
  Administered 2020-06-12: 50 ug via INTRAVENOUS
  Administered 2020-06-12 (×2): 25 ug via INTRAVENOUS

## 2020-06-12 MED ORDER — LACTATED RINGERS IV SOLN: INTRAVENOUS | Status: DC

## 2020-06-12 MED ORDER — FENTANYL CITRATE (PF) 250 MCG/5ML IJ SOLN
INTRAMUSCULAR | Status: AC
  Filled 2020-06-12: qty 5

## 2020-06-12 MED ORDER — ACETAMINOPHEN 10 MG/ML IV SOLN: 1000.0000 mg | Freq: Once | INTRAVENOUS | Status: DC | PRN

## 2020-06-12 MED ORDER — CEFAZOLIN SODIUM-DEXTROSE 2-4 GM/100ML-% IV SOLN
INTRAVENOUS | Status: AC
  Filled 2020-06-12: qty 100

## 2020-06-12 MED ORDER — FENTANYL CITRATE (PF) 100 MCG/2ML IJ SOLN: 50.0000 ug | Freq: Once | INTRAMUSCULAR | Status: AC

## 2020-06-12 MED ORDER — VANCOMYCIN HCL 1000 MG IV SOLR
INTRAVENOUS | Status: AC
  Filled 2020-06-12: qty 1000

## 2020-06-12 MED ORDER — CEFAZOLIN SODIUM 1 G IJ SOLR
INTRAMUSCULAR | Status: AC
  Filled 2020-06-12: qty 20

## 2020-06-12 MED ORDER — MIDAZOLAM HCL 5 MG/5ML IJ SOLN
INTRAMUSCULAR | Status: DC | PRN
  Administered 2020-06-12: 1 mg via INTRAVENOUS

## 2020-06-12 MED ORDER — MIDAZOLAM HCL 2 MG/2ML IJ SOLN
INTRAMUSCULAR | Status: AC
  Filled 2020-06-12: qty 2

## 2020-06-12 MED ORDER — 0.9 % SODIUM CHLORIDE (POUR BTL) OPTIME
TOPICAL | Status: DC | PRN
  Administered 2020-06-12: 1000 mL

## 2020-06-12 MED ORDER — VANCOMYCIN HCL 1000 MG IV SOLR
INTRAVENOUS | Status: DC | PRN
  Administered 2020-06-12: 1000 mg

## 2020-06-12 MED ORDER — HYDROMORPHONE HCL 1 MG/ML IJ SOLN: 0.2500 mg | INTRAMUSCULAR | Status: DC | PRN

## 2020-06-12 MED ORDER — CHLORHEXIDINE GLUCONATE 0.12 % MT SOLN
15.0000 mL | OROMUCOSAL | Status: AC
  Filled 2020-06-12: qty 15

## 2020-06-12 MED ORDER — HYDROMORPHONE HCL 1 MG/ML IJ SOLN: 0.5000 mg | INTRAMUSCULAR | Status: DC | PRN

## 2020-06-12 MED ORDER — CHLORHEXIDINE GLUCONATE 0.12 % MT SOLN
15.0000 mL | OROMUCOSAL | Status: AC
  Administered 2020-06-12: 15 mL via OROMUCOSAL
  Filled 2020-06-12: qty 15

## 2020-06-12 MED ORDER — OXYCODONE HCL 5 MG PO TABS: 5.0000 mg | ORAL_TABLET | ORAL | Status: DC | PRN

## 2020-06-12 MED ORDER — CEFAZOLIN SODIUM-DEXTROSE 2-4 GM/100ML-% IV SOLN
2.0000 g | Freq: Two times a day (BID) | INTRAVENOUS | Status: AC
  Administered 2020-06-12 – 2020-06-13 (×2): 2 g via INTRAVENOUS
  Filled 2020-06-12 (×2): qty 100

## 2020-06-12 MED ORDER — ACETAMINOPHEN 500 MG PO TABS
1000.0000 mg | ORAL_TABLET | Freq: Three times a day (TID) | ORAL | Status: DC
  Administered 2020-06-12 – 2020-06-16 (×11): 1000 mg via ORAL
  Filled 2020-06-12 (×11): qty 2

## 2020-06-12 MED ORDER — PROPOFOL 10 MG/ML IV BOLUS
INTRAVENOUS | Status: AC
  Filled 2020-06-12: qty 20

## 2020-06-12 MED ORDER — ALBUTEROL SULFATE (2.5 MG/3ML) 0.083% IN NEBU
INHALATION_SOLUTION | RESPIRATORY_TRACT | Status: AC
  Filled 2020-06-12: qty 3

## 2020-06-12 MED ORDER — FENTANYL CITRATE (PF) 100 MCG/2ML IJ SOLN
INTRAMUSCULAR | Status: AC
  Administered 2020-06-12: 50 ug via INTRAVENOUS
  Filled 2020-06-12: qty 2

## 2020-06-12 MED ORDER — ONDANSETRON HCL 4 MG/2ML IJ SOLN: 4.0000 mg | Freq: Once | INTRAMUSCULAR | Status: DC | PRN

## 2020-06-12 MED ORDER — METHOCARBAMOL 500 MG PO TABS: 500.0000 mg | ORAL_TABLET | Freq: Four times a day (QID) | ORAL | Status: DC | PRN

## 2020-06-12 MED ORDER — SODIUM CHLORIDE 0.9% IV SOLUTION: Freq: Once | INTRAVENOUS | Status: AC

## 2020-06-12 MED ORDER — DEXAMETHASONE SODIUM PHOSPHATE 10 MG/ML IJ SOLN
INTRAMUSCULAR | Status: DC | PRN
  Administered 2020-06-12 (×2): 5 mg

## 2020-06-12 MED ORDER — CEFAZOLIN SODIUM-DEXTROSE 1-4 GM/50ML-% IV SOLN
INTRAVENOUS | Status: DC | PRN
  Administered 2020-06-12: 2 g via INTRAVENOUS

## 2020-06-12 MED ORDER — ONDANSETRON HCL 4 MG PO TABS: 4.0000 mg | ORAL_TABLET | Freq: Three times a day (TID) | ORAL | Status: DC | PRN

## 2020-06-12 MED ORDER — ONDANSETRON HCL 4 MG/2ML IJ SOLN
INTRAMUSCULAR | Status: DC | PRN
  Administered 2020-06-12: 4 mg via INTRAVENOUS

## 2020-06-12 MED ORDER — DEXAMETHASONE SODIUM PHOSPHATE 10 MG/ML IJ SOLN
INTRAMUSCULAR | Status: AC
  Filled 2020-06-12: qty 1

## 2020-06-12 MED ORDER — PROPOFOL 10 MG/ML IV BOLUS
INTRAVENOUS | Status: DC | PRN
  Administered 2020-06-12: 130 mg via INTRAVENOUS

## 2020-06-12 MED ORDER — ALBUTEROL SULFATE (2.5 MG/3ML) 0.083% IN NEBU
2.5000 mg | INHALATION_SOLUTION | Freq: Once | RESPIRATORY_TRACT | Status: AC
  Administered 2020-06-12: 2.5 mg via RESPIRATORY_TRACT

## 2020-06-12 MED ORDER — ONDANSETRON HCL 4 MG/2ML IJ SOLN
INTRAMUSCULAR | Status: AC
  Filled 2020-06-12: qty 2

## 2020-06-12 MED ORDER — ROPIVACAINE HCL 5 MG/ML IJ SOLN
INTRAMUSCULAR | Status: DC | PRN
  Administered 2020-06-12: 20 mL via PERINEURAL

## 2020-06-12 SURGICAL SUPPLY — 65 items
BIT DRILL 2.2 SS TIBIAL (BIT) ×1 IMPLANT
BLADE CLIPPER SURG (BLADE) ×1 IMPLANT
BNDG CMPR 9X4 STRL LF SNTH (GAUZE/BANDAGES/DRESSINGS) ×1
BNDG ELASTIC 3X5.8 VLCR STR LF (GAUZE/BANDAGES/DRESSINGS) ×1 IMPLANT
BNDG ELASTIC 4X5.8 VLCR STR LF (GAUZE/BANDAGES/DRESSINGS) ×1 IMPLANT
BNDG ESMARK 4X9 LF (GAUZE/BANDAGES/DRESSINGS) ×2 IMPLANT
BRUSH SCRUB EZ PLAIN DRY (MISCELLANEOUS) ×3 IMPLANT
COVER SURGICAL LIGHT HANDLE (MISCELLANEOUS) ×2 IMPLANT
COVER WAND RF STERILE (DRAPES) ×1 IMPLANT
CUFF TOURN SGL QUICK 18X4 (TOURNIQUET CUFF) ×1 IMPLANT
DECANTER SPIKE VIAL GLASS SM (MISCELLANEOUS) IMPLANT
DRAPE C-ARM 42X72 X-RAY (DRAPES) ×2 IMPLANT
DRIVER BIT SQUARE 1.7/2.2 (TRAUMA) ×1 IMPLANT
DRSG EMULSION OIL 3X3 NADH (GAUZE/BANDAGES/DRESSINGS) ×1 IMPLANT
ELECT REM PT RETURN 9FT ADLT (ELECTROSURGICAL) ×2
ELECTRODE REM PT RTRN 9FT ADLT (ELECTROSURGICAL) ×1 IMPLANT
GAUZE SPONGE 4X4 12PLY STRL (GAUZE/BANDAGES/DRESSINGS) ×2 IMPLANT
GLOVE BIO SURGEON STRL SZ7.5 (GLOVE) ×2 IMPLANT
GLOVE BIO SURGEON STRL SZ8 (GLOVE) ×2 IMPLANT
GLOVE BIOGEL PI IND STRL 7.5 (GLOVE) ×1 IMPLANT
GLOVE BIOGEL PI IND STRL 8 (GLOVE) ×1 IMPLANT
GLOVE BIOGEL PI INDICATOR 7.5 (GLOVE) ×1
GLOVE BIOGEL PI INDICATOR 8 (GLOVE) ×1
GOWN STRL REUS W/ TWL LRG LVL3 (GOWN DISPOSABLE) ×2 IMPLANT
GOWN STRL REUS W/ TWL XL LVL3 (GOWN DISPOSABLE) ×1 IMPLANT
GOWN STRL REUS W/TWL LRG LVL3 (GOWN DISPOSABLE) ×4
GOWN STRL REUS W/TWL XL LVL3 (GOWN DISPOSABLE) ×2
K-WIRE 1.6 (WIRE) ×6
K-WIRE FX5X1.6XNS BN SS (WIRE) ×3
KIT BASIN OR (CUSTOM PROCEDURE TRAY) ×2 IMPLANT
KIT TURNOVER KIT B (KITS) ×2 IMPLANT
KWIRE FX5X1.6XNS BN SS (WIRE) IMPLANT
NDL HYPO 25GX1X1/2 BEV (NEEDLE) IMPLANT
NEEDLE HYPO 25GX1X1/2 BEV (NEEDLE) IMPLANT
NS IRRIG 1000ML POUR BTL (IV SOLUTION) ×2 IMPLANT
PACK ORTHO EXTREMITY (CUSTOM PROCEDURE TRAY) ×2 IMPLANT
PAD ARMBOARD 7.5X6 YLW CONV (MISCELLANEOUS) ×4 IMPLANT
PAD CAST 3X4 CTTN HI CHSV (CAST SUPPLIES) ×1 IMPLANT
PADDING CAST ABS 3INX4YD NS (CAST SUPPLIES) ×1
PADDING CAST ABS COTTON 3X4 (CAST SUPPLIES) IMPLANT
PADDING CAST COTTON 3X4 STRL (CAST SUPPLIES) ×2
PLATE NARROW DVR LEFT (Plate) ×1 IMPLANT
SCREW LOCK 14X2.7X 3 LD TPR (Screw) IMPLANT
SCREW LOCK 16X2.7X 3 LD TPR (Screw) IMPLANT
SCREW LOCK 18X2.7X 3 LD TPR (Screw) IMPLANT
SCREW LOCK 20X2.7X 3 LD TPR (Screw) IMPLANT
SCREW LOCK 22X2.7X 3 LD TPR (Screw) IMPLANT
SCREW LOCKING 2.7X14 (Screw) ×4 IMPLANT
SCREW LOCKING 2.7X16 (Screw) ×6 IMPLANT
SCREW LOCKING 2.7X18 (Screw) ×2 IMPLANT
SCREW LOCKING 2.7X20MM (Screw) ×4 IMPLANT
SCREW LOCKING 2.7X22MM (Screw) ×2 IMPLANT
SPLINT PLASTER CAST XFAST 4X15 (CAST SUPPLIES) IMPLANT
SPLINT PLASTER XTRA FAST SET 4 (CAST SUPPLIES) ×1
STRIP CLOSURE SKIN 1/2X4 (GAUZE/BANDAGES/DRESSINGS) ×1 IMPLANT
SUT ETHILON 3 0 PS 1 (SUTURE) ×2 IMPLANT
SUT MNCRL AB 3-0 PS2 27 (SUTURE) ×1 IMPLANT
SUT VIC AB 0 CT3 27 (SUTURE) ×2 IMPLANT
SUT VIC AB 2-0 CT1 27 (SUTURE) ×4
SUT VIC AB 2-0 CT1 TAPERPNT 27 (SUTURE) IMPLANT
SYR CONTROL 10ML LL (SYRINGE) IMPLANT
TOWEL GREEN STERILE (TOWEL DISPOSABLE) ×4 IMPLANT
TOWEL GREEN STERILE FF (TOWEL DISPOSABLE) ×2 IMPLANT
TUBE CONNECTING 12X1/4 (SUCTIONS) ×2 IMPLANT
UNDERPAD 30X36 HEAVY ABSORB (UNDERPADS AND DIAPERS) ×2 IMPLANT

## 2020-06-12 NOTE — Progress Notes (Signed)
PT Cancellation Note  Patient Details Name: [name] MRN: [id number] DOB: Oct 08, 1940   Cancelled Treatment:    Reason Eval/Treat Not Completed: Patient at procedure or test/unavailable Patient off unit for sx. PT will re-attempt eval as time allows.   Perrin Maltese, PT, DPT Acute Rehabilitation Services Pager [phone] Office [phone]    Alda Lea 06/12/2020, 12:21 PM

## 2020-06-12 NOTE — Progress Notes (Signed)
OT Cancellation Note  Patient Details Name: [name] MRN: [id number] DOB: 06-Jan-1941   Cancelled Treatment:    Reason Eval/Treat Not Completed: Patient at procedure or test/ unavailable (Off the floor for sx. Will return as schedule allows.)  [place], OTR/L Acute Rehab Pager: [phone] Office: [phone] 06/12/2020, 11:38 AM

## 2020-06-12 NOTE — Progress Notes (Signed)
[facility] Progress Note  Patient: [name]    SHF:[id number]  DOA: 06/10/2020     Date of Service: the patient was seen and examined on 06/12/2020  Brief hospital course: [name] is a 79 y.o. female with medical history significant for lung cancer, COPD who presented from home after mechanical fall.  She tripped over her grandchild toy, fell and struck her head against the ground.  Endorses left wrist pain and left hip pain.  She was in her usual state of health prior to this.  Upon arrival at [facility] ED CT head nonacute.  Work-up revealed acute left intratrochanteric femur fracture, and mildly displaced and angulated fracture of the distal radial metaphysis and probable nondisplaced fracture of the ulnar styloid.   Currently plan is monitor postop recovery.  Assessment and Plan: Acute left hip fracture status post mechanical fall at home N.p.o. due to possible surgical intervention Start IV fluid and LR at 50 cc/h x 2 days. Pain control along with bowel regimen and IV antiemetics as needed Ortho consult  Acute left wrist fracture Currently in a splint Pain control as above  AKI likely prerenal Baseline creatinine appears to be 0.8 with GFR greater than 60 Presented with creatinine of 1.5 with GFR 34 Avoid nephrotoxins and dehydration Monitor urine output Renal panel in the morning  Hyperkalemia Serum potassium 5.5 Get twelve-lead EKG Continue IV fluid Repeat level in the morning  Acute blood loss anemia from fracture Patient received 1 PRBC transfusion. Monitor.  CBC as well.  Essential hypertension BP is not controlled Resume home oral antihypertensives Monitor vital signs  Chronic anxiety/depression Resume home regimen  History of lung cancer Restart when appropriate, curbside with oncology in the morning  COPD Resume home regimen  Diet: Heart healthy diet DVT Prophylaxis:   heparin injection 5,000 Units Start: 06/12/20  0600  Advance goals of care discussion: Full code  Family Communication: no family was present at bedside, at the time of interview.   Disposition:  Status is: Inpatient  Remains inpatient appropriate because:Inpatient level of care appropriate due to severity of illness  Dispo:  Patient From: Home  Planned Disposition: Home with [facility]  Expected discharge date: 06/15/20  Medically stable for discharge: No  Subjective: No nausea no vomiting.  Pain well controlled.  No fever no chills.  No bleeding reported with the patient.  Physical Exam:  General: Appear in mild distress, no Rash; Oral Mucosa Clear, moist. no Abnormal Neck Mass Or lumps, Conjunctiva normal  Cardiovascular: S1 and S2 Present, no Murmur, Respiratory: good respiratory effort, Bilateral Air entry present and CTA, no Crackles, no wheezes Abdomen: Bowel Sound present, Soft and no tenderness Extremities: no Pedal edema Neurology: alert and oriented to time, place, and person affect appropriate. no new focal deficit Gait not checked due to patient safety concerns  Vitals:   06/12/20 1515 06/12/20 1530 06/12/20 1545 06/12/20 1559  BP: (!) 157/69 (!) 172/88 (!) 158/72 (!) 166/81  Pulse: 81 84 93 85  Resp: 14 15 15 18   Temp:   97.6 F (36.4 C) 97.9 F (36.6 C)  TempSrc:    Oral  SpO2: 100% 95% 96% 96%  Weight:      Height:        Intake/Output Summary (Last 24 hours) at 06/12/2020 1858 Last data filed at 06/12/2020 1434 Gross per 24 hour  Intake 2451.44 ml  Output 980 ml  Net 1471.44 ml   Filed Weights   06/11/20 1300  06/12/20 1113  Weight: 63.5 kg 63.5 kg    Data Reviewed: I have personally reviewed and interpreted daily labs, tele strips, imagings as discussed above. I reviewed all nursing notes, pharmacy notes, vitals, pertinent old records I have discussed plan of care as described above with RN and patient/family.  CBC: Recent Labs  Lab 06/10/20 2122 06/11/20 0818 06/11/20 1754  06/12/20 0442 06/12/20 0831  WBC 11.7* 6.5 5.8 5.7  --   NEUTROABS  --  4.6  --   --   --   HGB 9.3* 7.8* 7.0* 7.4* 7.4*  HCT 32.0* 26.6* 24.3* 24.5* 25.0*  MCV 86.7 85.0 86.8 86.6  --   PLT 301 245 216 209  --    Basic Metabolic Panel: Recent Labs  Lab 06/10/20 2122 06/11/20 0818 06/12/20 0831  NA 141 141 140  K 5.5* 4.9 5.1  CL 104 108 110  CO2 25 24 23   GLUCOSE 114* 99 91  BUN 22 22 20   CREATININE 1.55* 1.49* 1.51*  CALCIUM 8.7* 8.4* 8.3*    Studies: DG Wrist 2 Views Left  Result Date: 06/12/2020 CLINICAL DATA:  ORIF of left distal radius fracture. EXAM: LEFT WRIST - 2 VIEW COMPARISON:  None. FINDINGS: The patient is status post left distal radius fracture ORIF. Hardware is in good position. Alignment is significantly improved. IMPRESSION: ORIF left distal radius fracture. Electronically Signed   By: Dorise Bullion III M.D   On: 06/12/2020 16:52   DG Wrist Complete Left  Result Date: 06/12/2020 CLINICAL DATA:  ORIF of left wrist. FLUOROSCOPY TIME:  33 seconds. Images: 5 EXAM: LEFT WRIST - COMPLETE 3+ VIEW COMPARISON:  None. FINDINGS: Five images were obtained during left wrist ORIF. By the end of the study, hardware is in good position and alignment is near anatomic. IMPRESSION: Left wrist ORIF as above. Electronically Signed   By: Dorise Bullion III M.D   On: 06/12/2020 16:51   DG C-Arm 1-60 Min  Result Date: 06/12/2020 CLINICAL DATA:  ORIF of left wrist. EXAM: DG C-ARM 1-60 MIN FLUOROSCOPY TIME:  Fluoroscopy Time:  33 seconds Radiation Exposure Index (if provided by the fluoroscopic device): 0.57 mGy Number of Acquired Spot Images: 5 COMPARISON:  None. FINDINGS: Five images were obtained during ORIF of the distal radial fracture. Hardware is in good position by the end of the study. Alignment appears to be nearly anatomic by the end of the study. IMPRESSION: Left radius ORIF as above. Electronically Signed   By: Dorise Bullion III M.D   On: 06/12/2020 16:50     Scheduled Meds: . acetaminophen  1,000 mg Oral Q8H  . albuterol      . amLODipine  5 mg Oral Daily  . atorvastatin  20 mg Oral QPM  . chlorhexidine  15 mL Mouth/Throat NOW  . Chlorhexidine Gluconate Cloth  6 each Topical Q0600  . ferrous sulfate  325 mg Oral Q breakfast  . fluticasone furoate-vilanterol  1 puff Inhalation Daily  . gabapentin  400 mg Oral QID  . heparin injection (subcutaneous)  5,000 Units Subcutaneous Q8H  . metoprolol tartrate  12.5 mg Oral BID  . mirtazapine  7.5 mg Oral QHS  . mupirocin ointment  1 application Nasal BID  . oxybutynin  5 mg Oral QHS  . pantoprazole  40 mg Oral Q0600  . senna-docusate  2 tablet Oral BID  . sertraline  100 mg Oral Daily  . umeclidinium bromide  1 puff Inhalation Daily   Continuous Infusions: .  ceFAZolin    .  ceFAZolin (ANCEF) IV    . lactated ringers 10 mL/hr at 06/12/20 1135  . lactated ringers 10 mL/hr at 06/12/20 1140   PRN Meds: albuterol, diazepam, HYDROmorphone (DILAUDID) injection, methocarbamol, ondansetron, oxyCODONE  Time spent: 35 minutes  Author: Berle Mull, MD [facility] 06/12/2020 6:58 PM  To reach On-call, see care teams to locate the attending and reach out via [email or web address]. Between 7PM-7AM, please contact night-coverage If you still have difficulty reaching the attending provider, please page the [facility] (Director on Call) for [facility] on amion for assistance.

## 2020-06-12 NOTE — Interval H&P Note (Signed)
History and Physical Interval Note:  06/12/2020 11:59 AM  [name]  has presented today for surgery, with the diagnosis of Left distal radius fracture.  The various methods of treatment have been discussed with the patient and family. After consideration of risks, benefits and other options for treatment, the patient has consented to  Procedure(s): OPEN REDUCTION INTERNAL FIXATION (ORIF) DISTAL RADIAL FRACTURE (Left) as a surgical intervention.  The patient's history has been reviewed, patient examined, no change in status, stable for surgery.  I have reviewed the patient's chart and labs.  Questions were answered to the patient's satisfaction.     Lennette Bihari P Terez Montee

## 2020-06-12 NOTE — Anesthesia Procedure Notes (Signed)
Procedure Name: LMA Insertion Date/Time: 06/12/2020 1:37 PM Performed by: Michele Rockers, CRNA Pre-anesthesia Checklist: Patient identified, Patient being monitored, Timeout performed, Emergency Drugs available and Suction available Patient Re-evaluated:Patient Re-evaluated prior to induction Oxygen Delivery Method: Circle System Utilized Preoxygenation: Pre-oxygenation with 100% oxygen Induction Type: IV induction Ventilation: Mask ventilation without difficulty LMA: LMA inserted LMA Size: 4.0 Number of attempts: 1 Placement Confirmation: positive ETCO2 and breath sounds checked- equal and bilateral Tube secured with: Tape Dental Injury: Teeth and Oropharynx as per pre-operative assessment

## 2020-06-12 NOTE — Anesthesia Preprocedure Evaluation (Addendum)
Anesthesia Evaluation  Patient identified by MRN, date of birth, ID band Patient awake    Reviewed: Patient's Chart, lab work & pertinent test results  Airway Mallampati: II  TM Distance: >3 FB Neck ROM: Full    Dental  (+) Edentulous Upper, Edentulous Lower   Pulmonary asthma , COPD,  COPD inhaler, former smoker,  Adenocarcinoma right lung 2018   Pulmonary exam normal        Cardiovascular hypertension, Pt. on medications and Pt. 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 Bowel sounds: normal.  Peds  Hematology negative hematology ROS (+)   Anesthesia Other Findings   Reproductive/Obstetrics                            Anesthesia Physical Anesthesia Plan  ASA: III  Anesthesia Plan: General and Regional   Post-op Pain Management:  Regional for Post-op pain   Induction: Intravenous  PONV Risk Score and Plan: 3 and Ondansetron, Dexamethasone, Midazolam and Treatment may vary due to age or medical condition  Airway Management Planned: Mask and LMA  Additional Equipment: None  Intra-op Plan:   Post-operative Plan: Extubation in OR  Informed Consent: I have reviewed the patients History and Physical, chart, labs and discussed the procedure including the risks, benefits and alternatives for the proposed anesthesia with the patient or authorized representative who has indicated his/her understanding and acceptance.     Dental advisory given  Plan Discussed with: CRNA  Anesthesia Plan Comments: (Lab Results      Component                 Value               Date                      WBC                      5.7                 06/12/2020                HGB                      7.4 (L)             06/12/2020                HCT                      25.0 (L)            06/12/2020                MCV                      86.6                06/12/2020                PLT  209                 06/12/2020           ECHO 2019: Left ventricle: The cavity size was normal. Wall thickness was  normal. Systolic function was normal. The estimated ejection  fraction was in the range of 50% to 55%. Moderate hypokinesis of  the midanteroseptal myocardium. Doppler parameters are consistent  with abnormal left ventricular relaxation (grade 1 diastolic  dysfunction). )        Anesthesia Quick Evaluation

## 2020-06-12 NOTE — Anesthesia Procedure Notes (Addendum)
Anesthesia Regional Block: Supraclavicular block   Pre-Anesthetic Checklist: ,, timeout performed, Correct Patient, Correct Site, Correct Laterality, Correct Procedure, Correct Position, site marked, Risks and benefits discussed,  Surgical consent,  Pre-op evaluation,  At surgeon's request and post-op pain management  Laterality: Left  Prep: Dura Prep       Needles:  Injection technique: Single-shot  Needle Type: Echogenic Stimulator Needle     Needle Length: 5cm  Needle Gauge: 20     Additional Needles:   Procedures:,,,, ultrasound used (permanent image in chart),,,,  Narrative:  Start time: 06/12/2020 11:55 AM End time: 06/12/2020 12:00 PM Injection made incrementally with aspirations every 5 mL.  Performed by: Personally  Anesthesiologist: Darral Dash, DO  Additional Notes: Patient identified. Risks/Benefits/Options discussed with patient including but not limited to bleeding, infection, nerve damage, failed block, incomplete pain control. Patient expressed understanding and wished to proceed. All questions were answered. Sterile technique was used throughout the entire procedure. Please see nursing notes for vital signs. Aspirated in 5cc intervals with injection for negative confirmation. Patient was given instructions on fall risk and not to get out of bed. All questions and concerns addressed with instructions to call with any issues or inadequate analgesia.

## 2020-06-12 NOTE — Transfer of Care (Signed)
Immediate Anesthesia Transfer of Care Note  Patient: [name]  Procedure(s) Performed: OPEN REDUCTION INTERNAL FIXATION (ORIF) DISTAL RADIAL FRACTURE (Left Wrist)  Patient Location: PACU  Anesthesia Type:General  Level of Consciousness: drowsy, patient cooperative and responds to stimulation  Airway & Oxygen Therapy: Patient Spontanous Breathing and Patient connected to face mask oxygen  Post-op Assessment: Report given to RN and Post -op Vital signs reviewed and stable  Post vital signs: Reviewed and stable  Last Vitals:  Vitals Value Taken Time  BP    Temp    Pulse 81 06/12/20 1446  Resp    SpO2 100 % 06/12/20 1446  Vitals shown include unvalidated device data.  Last Pain:  Vitals:   06/12/20 1219  TempSrc: Oral  PainSc:          Complications: No complications documented.

## 2020-06-12 NOTE — Op Note (Signed)
Orthopaedic Surgery Operative Note (CSN: [id number] ) Date of Surgery: 06/12/2020  Admit Date: 06/10/2020   Diagnoses: Pre-Op Diagnoses: Left distal radius fracture   Post-Op Diagnosis: Same  Procedures: CPT 25607-Open reduction internal fixation of left distal radius fracture  Surgeons : Primary: Shona Needles, MD  Assistant: Patrecia Pace, PA-C  Location: OR 4   Anesthesia:General  Antibiotics: Ancef 2g preop with 1 gm vancomycin powder placed topically   Tourniquet time: Total Tourniquet Time Documented: Upper Arm (Left) - 26 minutes Total: Upper Arm (Left) - 26 minutes    Estimated Blood WFUX:32 mL  Complications:None   Specimens:None   Implants: Implant Name Type Inv. Item Serial No. Manufacturer Lot No. LRB No. Used Action  PLATE NARROW DVR LEFT - [id number] Plate PLATE NARROW DVR LEFT  [employer](ORTH,TRAU,BIO,SG)  Left 1 Implanted  SCREW LOCKING 2.7X14 - [id number] Screw SCREW LOCKING 2.7X14  [employer](ORTH,TRAU,BIO,SG)  Left 2 Implanted  SCREW LOCKING 2.7X16 - [id number] Screw SCREW LOCKING 2.7X16  [employer](ORTH,TRAU,BIO,SG)  Left 3 Implanted  SCREW LOCKING 2.7X18 - [id number] Screw SCREW LOCKING 2.7X18  [employer](ORTH,TRAU,BIO,SG)  Left 1 Implanted  SCREW LOCKING 2.7X20MM - [id number] Screw SCREW LOCKING 2.7X20MM  [employer](ORTH,TRAU,BIO,SG)  Left 2 Implanted  SCREW LOCKING 2.7X22MM - [id number] Screw SCREW LOCKING 2.7X22MM  [employer](ORTH,TRAU,BIO,SG)  Left 1 Implanted     Indications for Surgery: 79 year old female who sustained a ground-level fall.  She had a left greater trochanteric femur fracture that I recommended nonoperative treatment for.  She also had a left extra-articular distal radius fracture.  I discussed with her and her son risks and benefits of pursuing surgical intervention.  I felt that mobilization would be quicker as well and allowing for movement of the elbow and weightbearing through a platform walker.  I also discussed the  earlier weightbearing and improved range of motion and functional outcomes with open reduction internal fixation.  After full discussion of risks and benefits the patient and her son agreed to proceed with surgery and consent was obtained.  Operative Findings: Open reduction internal fixation of left extra-articular distal radius fracture using [employer] DVR volar distal radius locking plate.  Procedure: The patient was identified in the preoperative holding area. Consent was confirmed with the patient and their family and all questions were answered. The operative extremity was marked after confirmation with the patient. she was then brought back to the operating room by our anesthesia colleagues.  She was placed under general anesthetic and carefully transferred over to a regular OR table.  A nonsterile tourniquet was placed to her upper arm.  The left upper extremity was prepped and draped in usual sterile fashion.  A timeout was performed to verify the patient, the procedure, and the extremity.  Preoperative antibiotics were dosed.  Fluoroscopic imaging was obtained to show the unstable nature of her injury.  An Esmarch was used to exsanguinate the extremity and tourniquet was inflated to 250 mmHg.  Total tourniquet time as noted above. A standard FCR approach to the distal radius was carried down through skin and subcutaneous tissue.  I incised through the FCR sheath both volarly and dorsally and a sweep the finger flexors out of the way.  I then released the pronator quadratus off of the distal radius and expose the fracture.  A reduction maneuver was performed and a K wire was placed from the radial styloid across the fracture into the radial shaft.  I confirmed reduction with fluoroscopy.  I then placed a volar [employer]  [employer] DVR distal radius plate held it provisionally with K wires and confirmed positioning with fluoroscopy.  I then drilled and placed locking screws in the distal articular  segment.  I then drilled and placed a screws into the radial shaft to bring the plate flush to bone.  I then remove the K wire and placed a radial styloid screws.  I confirmed with fluoroscopy that all the screws were extra-articular.  Final fluoroscopic imaging was obtained.  The incision was copiously irrigated.  The tourniquet was deflated and cautery was used to obtain hemostasis.  A gram of vancomycin powder was placed into the incision.  A layer closure of 2-0 Vicryl and 3-0 Monocryl with Steri-Strips was placed.  A volar resting splint was applied to the wrist.  The patient was then awoken from anesthesia and taken to the PACU in stable condition.  Post Op Plan/Instructions: Patient  will be weightbearing as tolerated through the elbow to the left upper extremity.  She may use the arm for functional ADLs with no lifting through the wrist or hand.  She will receive postoperative Ancef.  She will receive Lovenox for DVT prophylaxis.  She will mobilize with physical and Occupational Therapy.  I was present and performed the entire surgery.  Patrecia Pace, PA-C did assist me throughout the case. An assistant was necessary given the difficulty in approach, maintenance of reduction and ability to instrument the fracture.   Katha Hamming, MD [facility]

## 2020-06-12 NOTE — Anesthesia Postprocedure Evaluation (Signed)
Anesthesia Post Note  Patient: [name]  Procedure(s) Performed: OPEN REDUCTION INTERNAL FIXATION (ORIF) DISTAL RADIAL FRACTURE (Left Wrist)     Patient location during evaluation: PACU Anesthesia Type: Regional and General Level of consciousness: awake and alert Pain management: pain level controlled Vital Signs Assessment: post-procedure vital signs reviewed and stable Respiratory status: spontaneous breathing, nonlabored ventilation, respiratory function stable and patient connected to nasal cannula oxygen Cardiovascular status: blood pressure returned to baseline and stable Postop Assessment: no apparent nausea or vomiting Anesthetic complications: no   No complications documented.  Last Vitals:  Vitals:   06/12/20 1545 06/12/20 1559  BP: (!) 158/72 (!) 166/81  Pulse: 93 85  Resp: 15 18  Temp: 36.4 C 36.6 C  SpO2: 96% 96%    Last Pain:  Vitals:   06/12/20 1559  TempSrc: Oral  PainSc:                  March Rummage Carisha Kantor

## 2020-06-13 ENCOUNTER — Encounter (HOSPITAL_COMMUNITY): Payer: Self-pay | Admitting: Internal Medicine

## 2020-06-13 DIAGNOSIS — S72102A Unspecified trochanteric fracture of left femur, initial encounter for closed fracture: Secondary | ICD-10-CM | POA: Diagnosis not present

## 2020-06-13 DIAGNOSIS — E559 Vitamin D deficiency, unspecified: Secondary | ICD-10-CM

## 2020-06-13 DIAGNOSIS — S52502A Unspecified fracture of the lower end of left radius, initial encounter for closed fracture: Secondary | ICD-10-CM

## 2020-06-13 HISTORY — DX: Vitamin D deficiency, unspecified: E55.9

## 2020-06-13 HISTORY — DX: Unspecified fracture of the lower end of left radius, initial encounter for closed fracture: S52.502A

## 2020-06-13 LAB — CBC
HCT: 27.3 % — ABNORMAL LOW (ref 36.0–46.0)
Hemoglobin: 8.4 g/dL — ABNORMAL LOW (ref 12.0–15.0)
MCH: 26.2 pg (ref 26.0–34.0)
MCHC: 30.8 g/dL (ref 30.0–36.0)
MCV: 85 fL (ref 80.0–100.0)
Platelets: 217 10*3/uL (ref 150–400)
RBC: 3.21 MIL/uL — ABNORMAL LOW (ref 3.87–5.11)
RDW: 17.9 % — ABNORMAL HIGH (ref 11.5–15.5)
WBC: 7 10*3/uL (ref 4.0–10.5)
nRBC: 0 % (ref 0.0–0.2)

## 2020-06-13 LAB — TYPE AND SCREEN
ABO/RH(D): A POS
Antibody Screen: NEGATIVE
Unit division: 0

## 2020-06-13 LAB — BPAM RBC
Blood Product Expiration Date: 202112032359
ISSUE DATE / TIME: 202111191014
Unit Type and Rh: 6200

## 2020-06-13 LAB — BASIC METABOLIC PANEL
Anion gap: 7 (ref 5–15)
BUN: 24 mg/dL — ABNORMAL HIGH (ref 8–23)
CO2: 24 mmol/L (ref 22–32)
Calcium: 8.3 mg/dL — ABNORMAL LOW (ref 8.9–10.3)
Chloride: 108 mmol/L (ref 98–111)
Creatinine, Ser: 1.4 mg/dL — ABNORMAL HIGH (ref 0.44–1.00)
GFR, Estimated: 39 mL/min — ABNORMAL LOW (ref >60)
Glucose, Bld: 88 mg/dL (ref 70–99)
Potassium: 4.2 mmol/L (ref 3.5–5.1)
Sodium: 139 mmol/L (ref 135–145)

## 2020-06-13 LAB — VITAMIN D 25 HYDROXY (VIT D DEFICIENCY, FRACTURES): Vit D, 25-Hydroxy: 23.21 ng/mL — ABNORMAL LOW (ref 30–100)

## 2020-06-13 MED ORDER — VITAMIN D 25 MCG (1000 UNIT) PO TABS
2000.0000 [IU] | ORAL_TABLET | Freq: Two times a day (BID) | ORAL | Status: DC
  Administered 2020-06-13 – 2020-06-16 (×6): 2000 [IU] via ORAL
  Filled 2020-06-13 (×7): qty 2

## 2020-06-13 MED ORDER — ADULT MULTIVITAMIN W/MINERALS CH
1.0000 | ORAL_TABLET | Freq: Every day | ORAL | Status: DC
  Administered 2020-06-13 – 2020-06-16 (×4): 1 via ORAL
  Filled 2020-06-13 (×4): qty 1

## 2020-06-13 MED ORDER — VITAMIN D (ERGOCALCIFEROL) 1.25 MG (50000 UNIT) PO CAPS
50000.0000 [IU] | ORAL_CAPSULE | ORAL | Status: DC
  Administered 2020-06-13: 50000 [IU] via ORAL
  Filled 2020-06-13: qty 1

## 2020-06-13 NOTE — Progress Notes (Signed)
[facility] Progress Note  Patient: [name]    KDT:[id number]  DOA: 06/10/2020     Date of Service: the patient was seen and examined on 06/13/2020  Brief hospital course: [name] is a 79 y.o. female with medical history significant for lung cancer, COPD who presented from home after mechanical fall.  She tripped over her grandchild toy, fell and struck her head against the ground.  Endorses left wrist pain and left hip pain.  She was in her usual state of health prior to this.  Upon arrival at [facility] ED CT head nonacute.  Work-up revealed acute left intratrochanteric femur fracture, and mildly displaced and angulated fracture of the distal radial metaphysis and probable nondisplaced fracture of the ulnar styloid.   Currently plan is monitor postop recovery.  Assessment and Plan: Acute left hip fracture status post mechanical fall at home N.p.o. due to possible surgical intervention Start IV fluid and LR at 50 cc/h x 2 days. Pain control along with bowel regimen and IV antiemetics as needed Ortho consult  Acute left wrist fracture Currently in a splint Pain control as above  AKI likely prerenal Baseline creatinine appears to be 0.8 with GFR greater than 60 Presented with creatinine of 1.5 with GFR 34 Avoid nephrotoxins and dehydration Monitor urine output Renal panel in the morning  Hyperkalemia Serum potassium 5.5 Get twelve-lead EKG Continue IV fluid Repeat level in the morning  Acute blood loss anemia from fracture Patient received 1 PRBC transfusion. Monitor.  CBC as well.  Essential hypertension BP is not controlled Resume home oral antihypertensives Monitor vital signs  Chronic anxiety/depression Resume home regimen  History of breast cancer Outpatient follow-up with oncology.  Resume [facility] outpatient  COPD Resume home regimen  Diet: Heart healthy diet DVT Prophylaxis:   heparin injection 5,000 Units Start: 06/12/20  0600  Advance goals of care discussion: Full code  Family Communication: no family was present at bedside, at the time of interview.   Disposition:  Status is: Inpatient  Remains inpatient appropriate because:Inpatient level of care appropriate due to severity of illness  Dispo:  Patient From: Home  Planned Disposition: Home with [facility] Care [facility]  Expected discharge date: 06/15/20  Medically stable for discharge: No  Subjective: No nausea no vomiting.  No fever no chills.  No chest pain.  Physical Exam:  General: Appear in mild distress, no Rash; Oral Mucosa Clear, moist. no Abnormal Neck Mass Or lumps, Conjunctiva normal  Cardiovascular: S1 and S2 Present, no Murmur, Respiratory: good respiratory effort, Bilateral Air entry present and CTA, no Crackles, no wheezes Abdomen: Bowel Sound present, Soft and no tenderness Extremities: no Pedal edema Neurology: alert and oriented to time, place, and person affect appropriate. no new focal deficit Gait not checked due to patient safety concerns  Vitals:   06/13/20 0439 06/13/20 0818 06/13/20 1033 06/13/20 1400  BP: (!) 149/68  (!) 150/75 (!) 142/78  Pulse: 66  75 69  Resp: 18  18 20   Temp: 98.2 F (36.8 C)  98.3 F (36.8 C) 98.4 F (36.9 C)  TempSrc: Oral  Oral Oral  SpO2: 99% 98% 96% 98%  Weight:      Height:        Intake/Output Summary (Last 24 hours) at 06/13/2020 1818 Last data filed at 06/13/2020 1034 Gross per 24 hour  Intake 300 ml  Output 500 ml  Net -200 ml   Filed Weights   06/11/20 1300 06/12/20 1113  Weight: 63.5 kg  63.5 kg    Data Reviewed: I have personally reviewed and interpreted daily labs, tele strips, imagings as discussed above. I reviewed all nursing notes, pharmacy notes, vitals, pertinent old records I have discussed plan of care as described above with RN and patient/family.  CBC: Recent Labs  Lab 06/11/20 0818 06/11/20 0818 06/11/20 1754 06/12/20 0442 06/12/20 0831 06/12/20 1805  06/13/20 0459  WBC 6.5  --  5.8 5.7  --  8.0 7.0  NEUTROABS 4.6  --   --   --   --   --   --   HGB 7.8*   < > 7.0* 7.4* 7.4* 9.0* 8.4*  HCT 26.6*   < > 24.3* 24.5* 25.0* 29.6* 27.3*  MCV 85.0  --  86.8 86.6  --  84.8 85.0  PLT 245  --  216 209  --  225 217   < > = values in this interval not displayed.   Basic Metabolic Panel: Recent Labs  Lab 06/10/20 2122 06/11/20 0818 06/12/20 0831 06/13/20 0459  NA 141 141 140 139  K 5.5* 4.9 5.1 4.2  CL 104 108 110 108  CO2 25 24 23 24   GLUCOSE 114* 99 91 88  BUN 22 22 20  24*  CREATININE 1.55* 1.49* 1.51* 1.40*  CALCIUM 8.7* 8.4* 8.3* 8.3*    Studies: No results found.  Scheduled Meds: . acetaminophen  1,000 mg Oral Q8H  . amLODipine  5 mg Oral Daily  . atorvastatin  20 mg Oral QPM  . Chlorhexidine Gluconate Cloth  6 each Topical Q0600  . cholecalciferol  2,000 Units Oral BID  . ferrous sulfate  325 mg Oral Q breakfast  . fluticasone furoate-vilanterol  1 puff Inhalation Daily  . gabapentin  400 mg Oral QID  . heparin injection (subcutaneous)  5,000 Units Subcutaneous Q8H  . metoprolol tartrate  12.5 mg Oral BID  . mirtazapine  7.5 mg Oral QHS  . multivitamin with minerals  1 tablet Oral Daily  . mupirocin ointment  1 application Nasal BID  . oxybutynin  5 mg Oral QHS  . pantoprazole  40 mg Oral Q0600  . senna-docusate  2 tablet Oral BID  . sertraline  100 mg Oral Daily  . umeclidinium bromide  1 puff Inhalation Daily  . Vitamin D (Ergocalciferol)  50,000 Units Oral Q7 days   Continuous Infusions: . lactated ringers 10 mL/hr at 06/12/20 1140   PRN Meds: albuterol, diazepam, HYDROmorphone (DILAUDID) injection, methocarbamol, ondansetron, oxyCODONE  Time spent: 35 minutes  Author: Berle Mull, MD [facility] 06/13/2020 6:18 PM  To reach On-call, see care teams to locate the attending and reach out via [email or web address]. Between 7PM-7AM, please contact night-coverage If you still have difficulty reaching the  attending provider, please page the [facility] (Director on Call) for [facility] on amion for assistance.

## 2020-06-13 NOTE — Progress Notes (Signed)
   06/13/20 0909  OTHER  Substance Abuse Education Offered No  Substance abuse interventions Other (must comment) (Patient denied any alcohol or substance use. Patient denied any prior history or concerns related to substance use.)  (CAGE-AID) Substance Abuse Screening Tool  Have You Ever Felt You Ought to Cut Down on Your Drinking or Drug Use? 0  Have People Annoyed You By Critizing Your Drinking Or Drug Use? 0  Have You Felt Bad Or Guilty About Your Drinking Or Drug Use? 0  Have You Ever Had a Drink or Used Drugs First Thing In The Morning to Steady Your Nerves or to Get Rid of a Hangover? 0  CAGE-AID Score 0

## 2020-06-13 NOTE — Evaluation (Signed)
Occupational Therapy Evaluation Patient Details Name: [name] MRN: [id number] DOB: 12/25/1940 Today's Date: 06/13/2020    History of Present Illness Pt is a 79 y/o female admitted after a mechanical fall at home in which she sustained a L wrist fx and a L greater trochanter fx. Pt is s/p ORIF left distal radius fracture. Ortho decided to manage her L LE fx non-operatively. PMH including but not limited to COPD, CAD and HTN.   Clinical Impression   Patient admitted for the above diagnosis and procedure.  PTA she reports independence with ADL, toilet, light home management and meal prep, and use of AD for mobility.  Currently she requires up to Mod A for mobility and Mod A for lower body ADL.  OT educated patient regarding HEP to R hand, elbow and shoulder.  Educated on positioning of R arm on pillows for edema control, and continued education on WB restrictions to R wrist.  Patient verbalizes understanding, but is very impulsive.  SNF has been recommended.  OT will continue to follow in the acute setting.      Follow Up Recommendations  SNF;Supervision/Assistance - 24 hour    Equipment Recommendations  3 in 1 bedside commode;Wheelchair (measurements OT);Wheelchair cushion (measurements OT)    Recommendations for Other Services       Precautions / Restrictions Precautions Precautions: Fall Restrictions Weight Bearing Restrictions: Yes LUE Weight Bearing: Weight bearing as tolerated LLE Weight Bearing: Weight bearing as tolerated Other Position/Activity Restrictions: LUE sugartong splint. WB through elbow for mobility per othro note.      Mobility Bed Mobility Overal bed mobility: Needs Assistance Bed Mobility: Sit to Supine     Supine to sit: Mod assist     General bed mobility comments: use of bed rails, increased time and effort needed    Transfers Overall transfer level: Needs assistance Equipment used: 1 person hand held assist Transfers: Sit to/from  Stand;Stand Pivot Transfers Sit to Stand: Mod assist Stand pivot transfers: Mod assist       General transfer comment: greatly increased time and effort needed; pt unable to achieve an upright standing position and maintained trunk and hips flexed throughout. She was very slow with the pivot to the recliner chair on the pt's R side    Balance Overall balance assessment: Needs assistance;History of Falls Sitting-balance support: Feet supported Sitting balance-Leahy Scale: Fair     Standing balance support: Bilateral upper extremity supported Standing balance-Leahy Scale: Poor                             ADL either performed or assessed with clinical judgement   ADL Overall ADL's : Needs assistance/impaired Eating/Feeding: Independent;Sitting   Grooming: Wash/dry face;Wash/dry hands;Set up;Sitting   Upper Body Bathing: Minimal assistance;Sitting   Lower Body Bathing: Maximal assistance;Sit to/from stand   Upper Body Dressing : Minimal assistance;Sitting   Lower Body Dressing: Sitting/lateral leans;Maximal assistance Lower Body Dressing Details (indicate cue type and reason): limited by discomfort to hip Toilet Transfer: Moderate assistance;Squat-pivot           Functional mobility during ADLs: Moderate assistance       Vision Baseline Vision/History: Wears glasses Wears Glasses: At all times Patient Visual Report: No change from baseline       Perception     Praxis      Pertinent Vitals/Pain Pain Assessment: Faces Faces Pain Scale: Hurts little more Pain Location: L wrist, L LE with movement  Pain Descriptors / Indicators: Grimacing;Guarding Pain Intervention(s): Monitored during session     Hand Dominance Right   Extremity/Trunk Assessment Upper Extremity Assessment Upper Extremity Assessment: RUE deficits/detail RUE Deficits / Details: ORIF to L wrist.  L wrist immobilized, but moving all other pivots without issue. RUE Sensation: WNL RUE  Coordination: WNL   Lower Extremity Assessment Lower Extremity Assessment: Defer to PT evaluation LLE Deficits / Details: pt with decreased strength and AROM limitations secondary to pain and weakness       Communication Communication Communication: No difficulties   Cognition Arousal/Alertness: Awake/alert Behavior During Therapy: Impulsive;Restless;Anxious Overall Cognitive Status: Within Functional Limits for tasks assessed                                 General Comments: cognition not formally assessed but [facility] for general conversations   General Comments       Exercises General Exercises - Upper Extremity Shoulder Flexion: AROM;Both Shoulder Extension: AROM;Both;10 reps Elbow Flexion: AROM;Both;10 reps Elbow Extension: AROM;Both;10 reps Digit Composite Flexion: AROM;Both;10 reps Composite Extension: AROM;Both;10 reps   Shoulder Instructions      Home Living Family/patient expects to be discharged to:: Private residence Living Arrangements: Children Available Help at Discharge: Family;Available 24 hours/day Type of Home: House Home Access: Stairs to enter [employer] of Steps: 3 Entrance Stairs-Rails: Right Home Layout: One level     Bathroom Shower/Tub: Teacher, early years/pre: Standard Bathroom Accessibility: Yes How Accessible: Accessible via walker Home Equipment: [place] - 2 wheels;Cane - single point;Bedside commode;Wheelchair - manual;Shower seat          Prior Functioning/Environment Level of Independence: Independent                 OT Problem List: Impaired balance (sitting and/or standing);Pain      OT Treatment/Interventions: Self-care/ADL training;Therapeutic exercise;Balance training;Therapeutic activities    OT Goals(Current goals can be found in the care plan section) Acute Rehab OT Goals Patient Stated Goal: I just need to move better OT Goal Formulation: With patient Time For Goal  Achievement: 06/27/20 Potential to Achieve Goals: Good  OT Frequency: Min 2X/week   Barriers to D/C:    none noted       Co-evaluation              AM-PAC OT "6 Clicks" Daily Activity     Outcome Measure Help from another person eating meals?: None Help from another person taking care of personal grooming?: A Little Help from another person toileting, which includes using toliet, bedpan, or urinal?: A Lot Help from another person bathing (including washing, rinsing, drying)?: A Lot Help from another person to put on and taking off regular upper body clothing?: A Little Help from another person to put on and taking off regular lower body clothing?: A Lot 6 Click Score: 16   End of Session Nurse Communication: Weight bearing status  Activity Tolerance: Patient tolerated treatment well Patient left: in bed;with call bell/phone within reach;with bed alarm set  OT Visit Diagnosis: Unsteadiness on feet (R26.81);Pain Pain - Right/Left: Right Pain - part of body: Arm                Time: 1202-1223 OT Time Calculation (min): 21 min Charges:  OT General Charges $OT Visit: 1 Visit OT Evaluation $OT Eval Moderate Complexity: 1 Mod  06/13/2020  Rich, OTR/L  Acute Rehabilitation Services  Office:  [phone]  Kanai Hilger D Sorin Frimpong 06/13/2020, 12:36 PM

## 2020-06-13 NOTE — Progress Notes (Signed)
[facility] Progress Note  Patient ID: GUSTAVO MEDITZ MRN: [id number] DOB/AGE: Jun 20, 1941 79 y.o.  Subjective:  Doing okay this morning, no specific complaints Some left hip soreness with mobilizing to the bedside commode but she states she slowed down and felt much better. No other specific concerns  ROS As above Objective:   VITALS:   Vitals:   06/13/20 0220 06/13/20 0439 06/13/20 0818 06/13/20 1033  BP: 118/63 (!) 149/68  (!) 150/75  Pulse: 69 66  75  Resp: 16 18  18   Temp: 97.8 F (36.6 C) 98.2 F (36.8 C)  98.3 F (36.8 C)  TempSrc: Oral Oral  Oral  SpO2: 97% 99% 98% 96%  Weight:      Height:        Estimated body mass index is 25.61 kg/m as calculated from the following:   Height as of this encounter: 5\' 2"  (1.575 m).   Weight as of this encounter: 63.5 kg.   Intake/Output      11/19 0701 - 11/20 0700 11/20 0701 - 11/21 0700   P.O. 0 300   I.V. (mL/kg) 400 (6.3)    Blood 212.5    Total Intake(mL/kg) 612.5 (9.6) 300 (4.7)   Urine (mL/kg/hr) 500 (0.3) 100 (0.3)   Stool  0   Blood 30    Total Output 530 100   Net +82.5 +200        Urine Occurrence  2 x   Stool Occurrence  1 x     LABS  Results for orders placed or performed during the hospital encounter of 06/10/20 (from the past 24 hour(s))  CBC     Status: Abnormal   Collection Time: 06/12/20  6:05 PM  Result Value Ref Range   WBC 8.0 4.0 - 10.5 K/uL   RBC 3.49 (L) 3.87 - 5.11 MIL/uL   Hemoglobin 9.0 (L) 12.0 - 15.0 g/dL   HCT 29.6 (L) 36 - 46 %   MCV 84.8 80.0 - 100.0 fL   MCH 25.8 (L) 26.0 - 34.0 pg   MCHC 30.4 30.0 - 36.0 g/dL   RDW 17.9 (H) 11.5 - 15.5 %   Platelets 225 150 - 400 K/uL   nRBC 0.2 0.0 - 0.2 %  CBC     Status: Abnormal   Collection Time: 06/13/20  4:59 AM  Result Value Ref Range   WBC 7.0 4.0 - 10.5 K/uL   RBC 3.21 (L) 3.87 - 5.11 MIL/uL   Hemoglobin 8.4 (L) 12.0 - 15.0 g/dL   HCT  27.3 (L) 36 - 46 %   MCV 85.0 80.0 - 100.0 fL   MCH 26.2 26.0 - 34.0 pg   MCHC 30.8 30.0 - 36.0 g/dL   RDW 17.9 (H) 11.5 - 15.5 %   Platelets 217 150 - 400 K/uL   nRBC 0.0 0.0 - 0.2 %  VITAMIN D 25 Hydroxy (Vit-D Deficiency, Fractures)     Status: Abnormal   Collection Time: 06/13/20  4:59 AM  Result Value Ref Range   Vit D, 25-Hydroxy 23.21 (L) 30 - 100 ng/mL  Basic metabolic panel     Status: Abnormal   Collection Time: 06/13/20  4:59 AM  Result Value Ref Range   Sodium 139 135 - 145 mmol/L   Potassium 4.2 3.5 - 5.1 mmol/L  Chloride 108 98 - 111 mmol/L   CO2 24 22 - 32 mmol/L   Glucose, Bld 88 70 - 99 mg/dL   BUN 24 (H) 8 - 23 mg/dL   Creatinine, Ser 1.40 (H) 0.44 - 1.00 mg/dL   Calcium 8.3 (L) 8.9 - 10.3 mg/dL   GFR, Estimated 39 (L) >60 mL/min   Anion gap 7 5 - 15     PHYSICAL EXAM:   Gen: Resting comfortably in bed, no acute distress, pleasant.  Communicating well Lungs: Unlabored Ext:    Left Upper Extremity    Splint is fitting well.  Clean, dry and intact   Swelling is well controlled, as expected   Extremity is warm   Radial, ulnar, median nerve sensory functions intact   Radial, ulnar, median, AIN, PIN motor function intact   No pain out of proportion with passive stretching of her digits   Elbow and shoulder are unremarkable.  Nontender   Left Lower Extremity    No acute changes to exam left leg  Assessment/Plan: 1 Day Post-Op   Active Problems:   Long term prescription opiate use   Greater trochanter fracture ([place]), left   Closed fracture of left distal radius   Vitamin D insufficiency   Anti-infectives (From admission, onward)   Start     Dose/Rate Route Frequency Ordered Stop   06/12/20 2200  ceFAZolin (ANCEF) IVPB 2g/100 mL premix        2 g 200 mL/hr over 30 Minutes Intravenous Every 12 hours 06/12/20 1800 06/13/20 1059   06/12/20 1415  vancomycin (VANCOCIN) powder  Status:  Discontinued          As needed 06/12/20 1425 06/12/20 1441    06/12/20 1118  ceFAZolin (ANCEF) 2-4 GM/100ML-% IVPB       Note to Pharmacy: Jasmine Pang   : cabinet override      06/12/20 1118 06/12/20 2329    .  POD/HD#: 42  79 year old female fall with left distal radius fracture and left greater trochanter fracture  -Fall  -Left distal radius fracture s/p ORIF   Nonweightbearing left wrist  No lifting with left wrist or hand.  No pushing up with left wrist  Okay to weight-bear through left elbow  Unrestricted range of motion left elbow and shoulder  Ice and elevate for swelling and pain control  PT and OT    Splint will remain in place for 2 weeks conversion to [facility] or removable wrist brace  -Left greater trochanter fracture  Nonoperative treatment  Weight-bear as tolerated  - Pain management:  Multimodal.  Continue with current regimen  - ABL anemia/Hemodynamics  Acute blood loss anemia, CBC in the morning, monitor  - Medical issues   Per primary  - DVT/PE prophylaxis:  Per primary  - ID:   Perioperative antibiotics  - Metabolic Bone Disease:  Vitamin D insufficiency---> supplement  Fracture mechanism suggestive of osteoporosis  Meets criteria for fracture liaison consult  Recommend bone density scan in the next 4 to 8 weeks  Primary goal is to prevent secondary fractures at this point  - Impediments to fracture healing:  Poor bone quality  Vitamin D insufficiency  Chronic opiate use (Rx pain meds)  - Dispo:  Ortho issues stable  May need SNF but patient is adamantly refusing   We will see how she progresses over the weekend  Jari Pigg, PA-C [phone] (C) 06/13/2020, 12:04 PM  [facility] [place]  [phone] (O)  [phone] (F)    After 5pm and on the weekends please log on to Amion, go to orthopaedics and the look under the [facility] Call for the provider(s) on call. You can also call our office at [phone] and then follow the  prompts to be connected to the call team.

## 2020-06-13 NOTE — Evaluation (Signed)
Physical Therapy Evaluation Patient Details Name: [name] MRN: [id number] DOB: Jan 08, 1941 Today's Date: 06/13/2020   History of Present Illness  Pt is a 79 y/o female admitted after a mechanical fall at home in which she sustained a L wrist fx and a L greater trochanter fx. Pt is s/p ORIF left distal radius fracture. Ortho decided to manage her L LE fx non-operatively. PMH including but not limited to COPD, CAD and HTN.    Clinical Impression  Pt presented supine in bed with HOB elevated, awake and willing to participate in therapy session. Prior to admission, pt reported that she was independent with all functional mobility and ADLs. Pt lives with her children in a single level home with three steps to enter. At the time of evaluation, pt limited overall secondary to pain, weakness and fatigue. She was able to perform bed mobility with min guard and transfers with mod A. She required an excess amount of time and effort to complete the transfers. Hopeful to progress to initiation of gait training at next session if tolerable. Pt would continue to benefit from skilled physical therapy services at this time while admitted and after d/c to address the below listed limitations in order to improve overall safety and independence with functional mobility.     Follow Up Recommendations SNF;Supervision/Assistance - 24 hour    Equipment Recommendations  Other (comment) (Platform attachment for a RW if pt d/c's home)    Recommendations for Other Services       Precautions / Restrictions Precautions Precautions: Fall Restrictions Weight Bearing Restrictions: Yes LUE Weight Bearing: Weight bear through elbow only LLE Weight Bearing: Weight bearing as tolerated      Mobility  Bed Mobility Overal bed mobility: Needs Assistance Bed Mobility: Supine to Sit     Supine to sit: Min guard;HOB elevated     General bed mobility comments: use of bed rails, increased time and effort needed     Transfers Overall transfer level: Needs assistance Equipment used: 1 person hand held assist Transfers: Sit to/from [facility] Sit to Stand: Mod assist Stand pivot transfers: Mod assist       General transfer comment: greatly increased time and effort needed; pt unable to achieve an upright standing position and maintained trunk and hips flexed throughout. She was very slow with the pivot to the recliner chair on the pt's R side  Ambulation/Gait             General Gait Details: deferred, pt unable to tolerate at this time  Stairs            Wheelchair Mobility    Modified Rankin (Stroke Patients Only)       Balance Overall balance assessment: Needs assistance;History of Falls Sitting-balance support: Feet supported Sitting balance-Leahy Scale: Fair     Standing balance support: During functional activity;Single extremity supported;Bilateral upper extremity supported Standing balance-Leahy Scale: Poor                               Pertinent Vitals/Pain Pain Assessment: Faces Faces Pain Scale: Hurts even more Pain Location: L wrist, L LE with WB'ing Pain Descriptors / Indicators: Grimacing;Guarding Pain Intervention(s): Monitored during session;Repositioned    Home Living Family/patient expects to be discharged to:: Private residence Living Arrangements: Children Available Help at Discharge: Family;Available 24 hours/day Type of Home: House Home Access: Stairs to enter Entrance Stairs-Rails: Right Entrance Stairs-Number of Steps: 3 Home  Layout: One level Home Equipment: Walker - 2 wheels;Cane - single point;Bedside commode;Wheelchair - manual      Prior Function Level of Independence: Independent               Hand Dominance   Dominant Hand: Right    Extremity/Trunk Assessment   Upper Extremity Assessment Upper Extremity Assessment: Defer to OT evaluation    Lower Extremity Assessment Lower Extremity  Assessment: LLE deficits/detail LLE Deficits / Details: pt with decreased strength and AROM limitations secondary to pain and weakness       Communication   Communication: No difficulties  Cognition Arousal/Alertness: Awake/alert Behavior During Therapy: Anxious Overall Cognitive Status: Within Functional Limits for tasks assessed                                 General Comments: cognition not formally assessed but [facility] for general conversations      General Comments      Exercises     Assessment/Plan    PT Assessment Patient needs continued PT services  PT Problem List Decreased strength;Decreased range of motion;Decreased activity tolerance;Decreased balance;Decreased mobility;Decreased coordination;Decreased knowledge of use of DME;Decreased safety awareness;Decreased knowledge of precautions;Pain       PT Treatment Interventions DME instruction;Stair training;Gait training;Therapeutic exercise;Therapeutic activities;Functional mobility training;Balance training;Neuromuscular re-education;Patient/family education    PT Goals (Current goals can be found in the Care Plan section)  Acute Rehab PT Goals Patient Stated Goal: decrease pain PT Goal Formulation: With patient Time For Goal Achievement: 06/27/20 Potential to Achieve Goals: Good    Frequency Min 3X/week   Barriers to discharge        Co-evaluation               AM-PAC PT "6 Clicks" Mobility  Outcome Measure Help needed turning from your back to your side while in a flat bed without using bedrails?: None Help needed moving from lying on your back to sitting on the side of a flat bed without using bedrails?: None Help needed moving to and from a bed to a chair (including a wheelchair)?: A Lot Help needed standing up from a chair using your arms (e.g., wheelchair or bedside chair)?: A Lot Help needed to walk in hospital room?: A Lot Help needed climbing 3-5 steps with a railing? : Total 6  Click Score: 15    End of Session Equipment Utilized During Treatment: Gait belt Activity Tolerance: Patient limited by fatigue;Patient limited by pain Patient left: in chair;with call bell/phone within reach;with chair alarm set Nurse Communication: Mobility status PT Visit Diagnosis: Other abnormalities of gait and mobility (R26.89);Pain Pain - Right/Left: Left Pain - part of body: Arm;Leg    Time: 0920-0955 PT Time Calculation (min) (ACUTE ONLY): 35 min   Charges:   PT Evaluation $PT Eval Moderate Complexity: 1 Mod PT Treatments $Therapeutic Activity: 8-22 mins        Anastasio Champion, DPT  Acute Rehabilitation Services Pager [phone] Office [place] 06/13/2020, 10:35 AM

## 2020-06-14 ENCOUNTER — Other Ambulatory Visit: Payer: Self-pay

## 2020-06-14 DIAGNOSIS — S72102A Unspecified trochanteric fracture of left femur, initial encounter for closed fracture: Secondary | ICD-10-CM | POA: Diagnosis not present

## 2020-06-14 LAB — CBC WITH DIFFERENTIAL/PLATELET
Abs Immature Granulocytes: 0.03 10*3/uL (ref 0.00–0.07)
Basophils Absolute: 0 10*3/uL (ref 0.0–0.1)
Basophils Relative: 0 %
Eosinophils Absolute: 0.3 10*3/uL (ref 0.0–0.5)
Eosinophils Relative: 4 %
HCT: 28.7 % — ABNORMAL LOW (ref 36.0–46.0)
Hemoglobin: 8.5 g/dL — ABNORMAL LOW (ref 12.0–15.0)
Immature Granulocytes: 0 %
Lymphocytes Relative: 20 %
Lymphs Abs: 1.4 10*3/uL (ref 0.7–4.0)
MCH: 25.3 pg — ABNORMAL LOW (ref 26.0–34.0)
MCHC: 29.6 g/dL — ABNORMAL LOW (ref 30.0–36.0)
MCV: 85.4 fL (ref 80.0–100.0)
Monocytes Absolute: 0.5 10*3/uL (ref 0.1–1.0)
Monocytes Relative: 8 %
Neutro Abs: 4.6 10*3/uL (ref 1.7–7.7)
Neutrophils Relative %: 68 %
Platelets: 242 10*3/uL (ref 150–400)
RBC: 3.36 MIL/uL — ABNORMAL LOW (ref 3.87–5.11)
RDW: 18.8 % — ABNORMAL HIGH (ref 11.5–15.5)
WBC: 6.9 10*3/uL (ref 4.0–10.5)
nRBC: 0 % (ref 0.0–0.2)

## 2020-06-14 LAB — CBC
HCT: 25.4 % — ABNORMAL LOW (ref 36.0–46.0)
Hemoglobin: 7.7 g/dL — ABNORMAL LOW (ref 12.0–15.0)
MCH: 26.1 pg (ref 26.0–34.0)
MCHC: 30.3 g/dL (ref 30.0–36.0)
MCV: 86.1 fL (ref 80.0–100.0)
Platelets: 214 10*3/uL (ref 150–400)
RBC: 2.95 MIL/uL — ABNORMAL LOW (ref 3.87–5.11)
RDW: 18.6 % — ABNORMAL HIGH (ref 11.5–15.5)
WBC: 6.2 10*3/uL (ref 4.0–10.5)
nRBC: 0 % (ref 0.0–0.2)

## 2020-06-14 LAB — BASIC METABOLIC PANEL
Anion gap: 8 (ref 5–15)
BUN: 25 mg/dL — ABNORMAL HIGH (ref 8–23)
CO2: 25 mmol/L (ref 22–32)
Calcium: 8 mg/dL — ABNORMAL LOW (ref 8.9–10.3)
Chloride: 107 mmol/L (ref 98–111)
Creatinine, Ser: 1.37 mg/dL — ABNORMAL HIGH (ref 0.44–1.00)
GFR, Estimated: 40 mL/min — ABNORMAL LOW (ref >60)
Glucose, Bld: 100 mg/dL — ABNORMAL HIGH (ref 70–99)
Potassium: 3.5 mmol/L (ref 3.5–5.1)
Sodium: 140 mmol/L (ref 135–145)

## 2020-06-14 LAB — FIBRINOGEN: Fibrinogen: 404 mg/dL (ref 210–475)

## 2020-06-14 LAB — RETICULOCYTES
Immature Retic Fract: 38.9 % — ABNORMAL HIGH (ref 2.3–15.9)
RBC.: 3.24 MIL/uL — ABNORMAL LOW (ref 3.87–5.11)
Retic Count, Absolute: 89.1 10*3/uL (ref 19.0–186.0)
Retic Ct Pct: 2.8 % (ref 0.4–3.1)

## 2020-06-14 NOTE — Progress Notes (Signed)
[facility] Progress Note  Patient: [name]    UTM:[id number]  DOA: 06/10/2020     Date of Service: the patient was seen and examined on 06/14/2020  Brief hospital course: [name] is a 79 y.o. female with medical history significant for lung cancer, COPD who presented from home after mechanical fall.  She tripped over her grandchild toy, fell and struck her head against the ground.  Endorses left wrist pain and left hip pain.  She was in her usual state of health prior to this.  Upon [facility] at [facility] ED CT head nonacute.  Work-up revealed acute left intratrochanteric femur fracture, and mildly displaced and angulated fracture of the distal radial metaphysis and probable nondisplaced fracture of the ulnar styloid.   Currently plan is monitor postop recovery.  Assessment and Plan: Acute left hip fracture status post mechanical fall at home Underwent surgery. Tolerated very well. Currently pain is well controlled.  Perioperative blood loss anemia. Hemoglobin dropped significantly from her baseline on admission. SP 1 PRBC transfusion. After the transfusion repeat hemoglobin was also on the lower side this morning at 7.7. Repeat is 8.5. Continue to monitor closely. Reticulocyte count normal therefore not suggesting significant acute bleed. Fibrinogen count normal therefore not suggesting any hemolytic process.  Diarrhea from stool softener. Discontinue regimen for now.  Monitor.  Acute left wrist fracture Currently in a splint Pain control as above  AKI likely prerenal Baseline creatinine appears to be 0.8 with GFR greater than 60 Presented with creatinine of 1.5 with GFR 34 Avoid nephrotoxins and dehydration Monitor urine output Treated with IV hydration.  Hyperkalemia Potassium significantly better.  Currently monitor BMP.  Essential hypertension BP is not controlled Resume home oral antihypertensives Monitor vital signs  Chronic  anxiety/depression Resume home regimen  History of breast cancer Outpatient follow-up with oncology.  Resume [facility] outpatient  COPD Resume home regimen  Diet: Heart healthy diet DVT Prophylaxis:   heparin injection 5,000 Units Start: 06/12/20 0600  Advance goals of care discussion: Full code  Family Communication: no family was present at bedside, at the time of interview.   Disposition:  Status is: Inpatient  Remains inpatient appropriate because:Inpatient level of care appropriate due to severity of illness  Dispo:  Patient From: Home  Planned Disposition: Home with Health Care Svc although ideally patient should benefit from going to rehab.  Expected discharge date: 06/15/20  Medically stable for discharge: No  Subjective: No acute complaint.  No no bleeding.  No fever no chills.  Physical Exam:  General: Appear in mild distress, no Rash; Oral Mucosa Clear, moist. no Abnormal Neck Mass Or lumps, Conjunctiva normal  Cardiovascular: S1 and S2 Present, no Murmur, Respiratory: good respiratory effort, Bilateral Air entry present and CTA, no Crackles, no wheezes Abdomen: Bowel Sound present, Soft and no tenderness Extremities: no Pedal edema Neurology: alert and oriented to time, place, and person affect appropriate. no new focal deficit Gait not checked due to patient safety concerns  Vitals:   06/13/20 2025 06/14/20 0507 06/14/20 0852 06/14/20 1325  BP: (!) 149/64 (!) 156/65  (!) 151/58  Pulse: 79 62  64  Resp: 17 17  (!) 21  Temp: 98.2 F (36.8 C) 98.4 F (36.9 C)  98 F (36.7 C)  TempSrc: Oral Oral    SpO2: 96% 96% 97% 98%  Weight:      Height:        Intake/Output Summary (Last 24 hours) at 06/14/2020 1957 Last data filed at  06/14/2020 0518 Gross per 24 hour  Intake -  Output 450 ml  Net -450 ml   Filed Weights   06/11/20 1300 06/12/20 1113  Weight: 63.5 kg 63.5 kg    Data Reviewed: I have personally reviewed and interpreted daily labs, tele  strips, imagings as discussed above. I reviewed all nursing notes, pharmacy notes, vitals, pertinent old records I have discussed plan of care as described above with RN and patient/family.  CBC: Recent Labs  Lab 06/11/20 0818 06/11/20 1754 06/12/20 0442 06/12/20 0442 06/12/20 0831 06/12/20 1805 06/13/20 0459 06/14/20 0312 06/14/20 1609  WBC 6.5   < > 5.7  --   --  8.0 7.0 6.2 6.9  NEUTROABS 4.6  --   --   --   --   --   --   --  4.6  HGB 7.8*   < > 7.4*   < > 7.4* 9.0* 8.4* 7.7* 8.5*  HCT 26.6*   < > 24.5*   < > 25.0* 29.6* 27.3* 25.4* 28.7*  MCV 85.0   < > 86.6  --   --  84.8 85.0 86.1 85.4  PLT 245   < > 209  --   --  225 217 214 242   < > = values in this interval not displayed.   Basic Metabolic Panel: Recent Labs  Lab 06/10/20 2122 06/11/20 0818 06/12/20 0831 06/13/20 0459 06/14/20 0312  NA 141 141 140 139 140  K 5.5* 4.9 5.1 4.2 3.5  CL 104 108 110 108 107  CO2 25 24 23 24 25   GLUCOSE 114* 99 91 88 100*  BUN 22 22 20  24* 25*  CREATININE 1.55* 1.49* 1.51* 1.40* 1.37*  CALCIUM 8.7* 8.4* 8.3* 8.3* 8.0*    Studies: No results found.  Scheduled Meds: . acetaminophen  1,000 mg Oral Q8H  . amLODipine  5 mg Oral Daily  . atorvastatin  20 mg Oral QPM  . Chlorhexidine Gluconate Cloth  6 each Topical Q0600  . cholecalciferol  2,000 Units Oral BID  . ferrous sulfate  325 mg Oral Q breakfast  . fluticasone furoate-vilanterol  1 puff Inhalation Daily  . gabapentin  400 mg Oral QID  . heparin injection (subcutaneous)  5,000 Units Subcutaneous Q8H  . metoprolol tartrate  12.5 mg Oral BID  . mirtazapine  7.5 mg Oral QHS  . multivitamin with minerals  1 tablet Oral Daily  . mupirocin ointment  1 application Nasal BID  . oxybutynin  5 mg Oral QHS  . pantoprazole  40 mg Oral Q0600  . sertraline  100 mg Oral Daily  . umeclidinium bromide  1 puff Inhalation Daily  . Vitamin D (Ergocalciferol)  50,000 Units Oral Q7 days   Continuous Infusions:  PRN Meds: albuterol,  diazepam, HYDROmorphone (DILAUDID) injection, methocarbamol, ondansetron, oxyCODONE  Time spent: 35 minutes  Author: Berle Mull, MD [facility] 06/14/2020 7:57 PM  To reach On-call, see care teams to locate the attending and reach out via [email or web address]. Between 7PM-7AM, please contact night-coverage If you still have difficulty reaching the attending provider, please page the [facility] (Director on Call) for [facility] on amion for assistance.

## 2020-06-15 ENCOUNTER — Encounter (HOSPITAL_COMMUNITY): Payer: Self-pay | Admitting: Student

## 2020-06-15 DIAGNOSIS — S72102A Unspecified trochanteric fracture of left femur, initial encounter for closed fracture: Secondary | ICD-10-CM | POA: Diagnosis not present

## 2020-06-15 LAB — BASIC METABOLIC PANEL
Anion gap: 10 (ref 5–15)
BUN: 21 mg/dL (ref 8–23)
CO2: 24 mmol/L (ref 22–32)
Calcium: 8.3 mg/dL — ABNORMAL LOW (ref 8.9–10.3)
Chloride: 108 mmol/L (ref 98–111)
Creatinine, Ser: 1.15 mg/dL — ABNORMAL HIGH (ref 0.44–1.00)
GFR, Estimated: 49 mL/min — ABNORMAL LOW (ref >60)
Glucose, Bld: 104 mg/dL — ABNORMAL HIGH (ref 70–99)
Potassium: 3.6 mmol/L (ref 3.5–5.1)
Sodium: 142 mmol/L (ref 135–145)

## 2020-06-15 LAB — CBC
HCT: 26.8 % — ABNORMAL LOW (ref 36.0–46.0)
Hemoglobin: 8.1 g/dL — ABNORMAL LOW (ref 12.0–15.0)
MCH: 26 pg (ref 26.0–34.0)
MCHC: 30.2 g/dL (ref 30.0–36.0)
MCV: 86.2 fL (ref 80.0–100.0)
Platelets: 238 10*3/uL (ref 150–400)
RBC: 3.11 MIL/uL — ABNORMAL LOW (ref 3.87–5.11)
RDW: 19.3 % — ABNORMAL HIGH (ref 11.5–15.5)
WBC: 5.8 10*3/uL (ref 4.0–10.5)
nRBC: 0 % (ref 0.0–0.2)

## 2020-06-15 NOTE — NC FL2 (Addendum)
[place] MEDICAID FL2 LEVEL OF CARE SCREENING TOOL     IDENTIFICATION  Patient Name: [name] Birthdate: [birth year] Sex: female Admission Date (Current Location): 06/10/2020  [facility] and [place] Number:  Herbalist and Address:  [facility] [place], [place] [address], [place], [place] [address]      Provider Number: [id number]  Attending Physician Name and Address:  Lavina Hamman, MD  Relative Name and Phone Number:  Cleaster Corin Merry Proud) (Son) [phone] [facility])    Current Level of Care: Hospital Recommended Level of Care: [place] Prior Approval Number:    Date Approved/Denied:   PASRR Number: [id number]  Discharge Plan: SNF    Current Diagnoses: Patient Active Problem List   Diagnosis Date Noted  . Closed fracture of left distal radius 06/13/2020  . Vitamin D insufficiency 06/13/2020  . Greater trochanter fracture ([place]), left 06/11/2020  . Cough   . Acute metabolic encephalopathy 03/47/4259  . S/P total knee replacement 05/27/2019  . 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 Bilateral renal cysts 05/21/2018  . Hiatal hernia 05/21/2018  . 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 Thoracic radiculitis (Bilateral) (L>R) 05/03/2018  . Chronic thoracic back pain (Left) 05/03/2018  . Injury of peripheral nerves of thorax, sequela 03/22/2018  . Chest pain 03/21/2018  . Rib pain (Left) 03/21/2018  . Chronic rib pain (Right) 03/20/2018  . DDD (degenerative disc disease), lumbar 03/20/2018  . Chronic fracture of pubic ramus, sequela (Right) 03/20/2018  . History of femoral neck fracture, sequela (Left) 03/20/2018  . Lumbar facet arthropathy (Bilateral) 03/20/2018  . Lumbar facet syndrome (Bilateral) 03/20/2018  . Osteoarthritis of facet joint of lumbar spine 03/20/2018  . Spondylosis without myelopathy or radiculopathy, lumbosacral region 03/20/2018  . Other specified dorsopathies, sacral and  sacrococcygeal region 03/20/2018  . Chronic pain syndrome 02/26/2018  . Cancer related pain 02/26/2018  . 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 Chronic low back pain (Secondary Area of Pain) (Bilateral) 02/26/2018  . 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 Chronic lower extremity pain [facility] Area of Pain) (Bilateral) (R>L) 02/26/2018  . Chronic hip pain (Fourth Area of Pain) (Bilateral) (R>L) 02/26/2018  . Neurogenic pain 02/26/2018  . Pharmacologic therapy 02/26/2018  . Problems influencing health status 02/26/2018  . Long term prescription benzodiazepine use 02/26/2018  . Long term prescription opiate use 02/26/2018  . Acute systolic heart failure ([place])   . Non-ST elevation (NSTEMI) myocardial infarction ([place])   . History of uterine cancer 12/08/2017  . Generalized anxiety disorder 12/08/2017  . GERD (gastroesophageal reflux disease) 12/08/2017  . Substernal chest pain 12/08/2017  . Elevated brain natriuretic peptide (BNP) level 12/08/2017  . Elevated troponin 12/08/2017  . Embedded tick of upper back excluding scapular region 12/08/2017  . Chronic sacroiliac joint pain 06/21/2017  . Disorder of skeletal system 06/21/2017  . Other long term (current) drug therapy 06/21/2017  . Other specified health status 06/21/2017  . Chronic chest wall pain (Primary Area of Pain) 06/21/2017  . Chronic post-thoracotomy pain 03/03/2017  . 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 Adenocarcinoma of right lung, stage 1 ([place]) 07/28/2016  . S/P lobectomy of lung 05/09/2016  . DVT (deep venous thrombosis) ([place]) 04/26/2016  . Heart murmur   . Acute exacerbation of chronic obstructive pulmonary disease (COPD) ([place]) 08/29/2015  . Essential hypertension 08/29/2015  . Hypercholesterolemia 08/29/2015  . CKD (chronic kidney disease), stage III ([place]) 08/29/2015  .  COPD (chronic obstructive pulmonary disease) ([place]) 04/27/2015   . Acute on chronic respiratory failure with hypoxia ([place]) 04/27/2015  . Infected sebaceous cyst 08/16/2011    Orientation RESPIRATION BLADDER Height & Weight     Self, Time, Situation, Place  Normal External catheter Weight: 140 lb (63.5 kg) Height:  5\' 2"  (157.5 cm)  BEHAVIORAL SYMPTOMS/MOOD NEUROLOGICAL BOWEL NUTRITION STATUS      Continent Diet (see d/c summary)  AMBULATORY STATUS COMMUNICATION OF NEEDS Skin   Extensive Assist Verbally Surgical wounds (Incision left wrist)                       Personal Care Assistance Level of Assistance  Bathing, Feeding, Dressing Bathing Assistance: Limited assistance Feeding assistance: Independent Dressing Assistance: Limited assistance     Functional Limitations Info  Sight, Hearing, Speech Sight Info: Adequate Hearing Info: Adequate Speech Info: Adequate    SPECIAL CARE FACTORS FREQUENCY  PT (By licensed PT), OT (By licensed OT)     PT Frequency: 5x/week OT Frequency: 5x/week            Contractures Contractures Info: Not present    Additional Factors Info  Code Status, Allergies Code Status Info: Full code Allergies Info: Boniva (ibandronate Sodium), lyrica, Levofloxacin           Current Medications (06/15/2020):  This is the current hospital active medication list Current Facility-Administered Medications  Medication Dose Route Frequency Provider Last Rate Last Admin  . acetaminophen (TYLENOL) tablet 1,000 mg  1,000 mg Oral Q8H Patrecia Pace A, PA-C   1,000 mg at 06/15/20 0534  . albuterol (PROVENTIL) (2.5 MG/3ML) 0.083% nebulizer solution 2.5 mg  2.5 mg Nebulization Q2H PRN Delray Alt, PA-C      . amLODipine (NORVASC) tablet 5 mg  5 mg Oral Daily Patrecia Pace A, PA-C   5 mg at 06/15/20 1015  . atorvastatin (LIPITOR) tablet 20 mg  20 mg Oral QPM Patrecia Pace A, PA-C   20 mg at 06/14/20 1745  . Chlorhexidine Gluconate Cloth 2 % PADS 6 each  6 each Topical Q0600 Delray Alt, PA-C   6 each at  06/15/20 0535  . cholecalciferol (VITAMIN D3) tablet 2,000 Units  2,000 Units Oral BID Ainsley Spinner, PA-C   2,000 Units at 06/15/20 1015  . diazepam (VALIUM) tablet 2.5 mg  2.5 mg Oral Q8H PRN Delray Alt, PA-C      . ferrous sulfate tablet 325 mg  325 mg Oral Q breakfast Patrecia Pace A, PA-C   325 mg at 06/15/20 1015  . fluticasone furoate-vilanterol (BREO ELLIPTA) 100-25 MCG/INH 1 puff  1 puff Inhalation Daily Delray Alt, PA-C   1 puff at 06/15/20 0817  . gabapentin (NEURONTIN) capsule 400 mg  400 mg Oral QID Patrecia Pace A, PA-C   400 mg at 06/15/20 1015  . heparin injection 5,000 Units  5,000 Units Subcutaneous Q8H Delray Alt, PA-C   5,000 Units at 06/15/20 0535  . HYDROmorphone (DILAUDID) injection 0.5-1 mg  0.5-1 mg Intravenous Q4H PRN Delray Alt, PA-C      . methocarbamol (ROBAXIN) tablet 500 mg  500 mg Oral Q6H PRN Delray Alt, PA-C      . metoprolol tartrate (LOPRESSOR) tablet 12.5 mg  12.5 mg Oral BID Patrecia Pace A, PA-C   12.5 mg at 06/15/20 1015  . mirtazapine (REMERON) tablet 7.5 mg  7.5 mg Oral QHS Patrecia Pace A, PA-C   7.5 mg at  06/14/20 2113  . multivitamin with minerals tablet 1 tablet  1 tablet Oral Daily Ainsley Spinner, PA-C   1 tablet at 06/15/20 1015  . mupirocin ointment (BACTROBAN) 2 % 1 application  1 application Nasal BID Delray Alt, PA-C   1 application at 85/92/92 1016  . ondansetron (ZOFRAN) tablet 4 mg  4 mg Oral Q8H PRN Delray Alt, PA-C      . oxybutynin (DITROPAN-XL) 24 hr tablet 5 mg  5 mg Oral QHS Patrecia Pace A, PA-C   5 mg at 06/14/20 2113  . oxyCODONE (Oxy IR/ROXICODONE) immediate release tablet 5-10 mg  5-10 mg Oral Q4H PRN Delray Alt, PA-C      . pantoprazole (PROTONIX) EC tablet 40 mg  40 mg Oral Q0600 Patrecia Pace A, PA-C   40 mg at 06/15/20 0535  . sertraline (ZOLOFT) tablet 100 mg  100 mg Oral Daily Patrecia Pace A, PA-C   100 mg at 06/15/20 1016  . umeclidinium bromide (INCRUSE ELLIPTA) 62.5 MCG/INH 1 puff  1 puff  Inhalation Daily Delray Alt, PA-C   1 puff at 06/15/20 0817  . Vitamin D (Ergocalciferol) (DRISDOL) capsule 50,000 Units  50,000 Units Oral Q7 days Lavina Hamman, MD   50,000 Units at 06/13/20 1241     Discharge Medications: Please see discharge summary for a list of discharge medications.  Relevant Imaging Results:  Relevant Lab Results:   Additional Information [id number]  Bethann Berkshire, LCSW

## 2020-06-15 NOTE — Progress Notes (Signed)
Orthopaedic Trauma Progress Note  SUBJECTIVE: Reports mild pain in left hip with moving from bedside commode. No issues with left wrist. No chest pain. No SOB. No nausea/vomiting. No other specific complaints. Has no required any oxycodone over the past several days  OBJECTIVE:  Vitals:   06/15/20 0440 06/15/20 0820  BP: 128/67   Pulse: 65 68  Resp: 17 18  Temp: 97.7 F (36.5 C)   SpO2: 93% 95%    General: Transitioning back to bed from bedside commode. NAD Respiratory: No increased work of breathing.  :eft Upper Extremity: Short arm splint in place, swelling in fingers appropriate. Significant swelling above splint. Full elbow motion.  Motor and sensory function intact through median, ulnar, radial nerve distributions. Hand warm and well perfused Left Lower extremity: No significant tenderness with palpation. Reports some discomfort with certain movements. Knee and ankle motion intact. Motor and sensory intact distally. +DP pulse  IMAGING: Stable post op imaging.   LABS:  Results for orders placed or performed during the hospital encounter of 06/10/20 (from the past 24 hour(s))  Reticulocytes     Status: Abnormal   Collection Time: 06/14/20  4:09 PM  Result Value Ref Range   Retic Ct Pct 2.8 0.4 - 3.1 %   RBC. 3.24 (L) 3.87 - 5.11 MIL/uL   Retic Count, Absolute 89.1 19.0 - 186.0 K/uL   Immature Retic Fract 38.9 (H) 2.3 - 15.9 %  CBC with Differential/Platelet     Status: Abnormal   Collection Time: 06/14/20  4:09 PM  Result Value Ref Range   WBC 6.9 4.0 - 10.5 K/uL   RBC 3.36 (L) 3.87 - 5.11 MIL/uL   Hemoglobin 8.5 (L) 12.0 - 15.0 g/dL   HCT 28.7 (L) 36 - 46 %   MCV 85.4 80.0 - 100.0 fL   MCH 25.3 (L) 26.0 - 34.0 pg   MCHC 29.6 (L) 30.0 - 36.0 g/dL   RDW 18.8 (H) 11.5 - 15.5 %   Platelets 242 150 - 400 K/uL   nRBC 0.0 0.0 - 0.2 %   Neutrophils Relative % 68 %   Neutro Abs 4.6 1.7 - 7.7 K/uL   Lymphocytes Relative 20 %   Lymphs Abs 1.4 0.7 - 4.0 K/uL   Monocytes  Relative 8 %   Monocytes Absolute 0.5 0.1 - 1.0 K/uL   Eosinophils Relative 4 %   Eosinophils Absolute 0.3 0.0 - 0.5 K/uL   Basophils Relative 0 %   Basophils Absolute 0.0 0.0 - 0.1 K/uL   Immature Granulocytes 0 %   Abs Immature Granulocytes 0.03 0.00 - 0.07 K/uL  Fibrinogen     Status: None   Collection Time: 06/14/20  4:40 PM  Result Value Ref Range   Fibrinogen 404 210 - 475 mg/dL  Basic metabolic panel     Status: Abnormal   Collection Time: 06/15/20  2:59 AM  Result Value Ref Range   Sodium 142 135 - 145 mmol/L   Potassium 3.6 3.5 - 5.1 mmol/L   Chloride 108 98 - 111 mmol/L   CO2 24 22 - 32 mmol/L   Glucose, Bld 104 (H) 70 - 99 mg/dL   BUN 21 8 - 23 mg/dL   Creatinine, Ser 1.15 (H) 0.44 - 1.00 mg/dL   Calcium 8.3 (L) 8.9 - 10.3 mg/dL   GFR, Estimated 49 (L) >60 mL/min   Anion gap 10 5 - 15  CBC     Status: Abnormal   Collection Time: 06/15/20  2:59 AM  Result Value Ref Range   WBC 5.8 4.0 - 10.5 K/uL   RBC 3.11 (L) 3.87 - 5.11 MIL/uL   Hemoglobin 8.1 (L) 12.0 - 15.0 g/dL   HCT 26.8 (L) 36 - 46 %   MCV 86.2 80.0 - 100.0 fL   MCH 26.0 26.0 - 34.0 pg   MCHC 30.2 30.0 - 36.0 g/dL   RDW 19.3 (H) 11.5 - 15.5 %   Platelets 238 150 - 400 K/uL   nRBC 0.0 0.0 - 0.2 %    ASSESSMENT: [name] is a 79 y.o. female s/p fall, 3 Days Post-Op  Injuries: 1. L distal radius fractire s/p ORIF s/p Procedure(s): 2. L greater trochanter fracture s/p non-op treatment  CV/Blood loss: Acute blood loss anemia, Hgb 8.1 this morning. Hemodynamically stable  PLAN: Weightbearing: WBAT LLE, WB thru L elbow, NWB thru wrist/hand Incisional and dressing care:  Splint LUE x 2 weeks Showering: Ok to shower with assistance. Keep LUE splint dry Orthopedic device(s): Splint LUE Pain management:  1. Tylenol 1000 mg q 8 hours scheduled 2. Robaxin 500 mg q 6 hours PRN 3. Oxycodone 5-10 mg q 4 hours PRN 4. Neurontin 400 mg QID 5. Dilaudid 0.5-1 mg q 4 hours PRN VTE prophylaxis: Heparin,  SCDs ID:  Ancef 2gm post op completed Foley/Lines:  No foley, KVO IVFs Impediments to Fracture Healing: Vit D level 23, continue supplementation Dispo: Therapies as tolerated. PT/OT recommending SNF. Okay for discharge from ortho standpoint once cleared by medicine team and therapies. Would recommend continuing Vit D supplementation at discharge. Do not plan to write any rx for narcotic pain medications at discharge as patient has not required these over past several days  Follow - up plan: 2 weeks for repeat x-rays and transition to removable splint  Contact information:  Katha Hamming MD, Patrecia Pace PA-C. After hours and holidays please check [email or web address] for group call information for [facility]   Felicia Topete A. Ricci Barker, PA-C [phone] (office) [email or web address]

## 2020-06-15 NOTE — Progress Notes (Signed)
   06/15/20 1500  Clinical Encounter Type  Visited With Patient  Visit Type Initial  Spiritual Encounters  Spiritual Needs Prayer  Stress Factors  Patient Stress Factors Family relationships  Family Stress Factors Major life changes  Chaplain visited with patient, who asked that chaplain pray for her two sisters, one of which is also in the hospital. Patient was grateful for the support and prayer.    [place]

## 2020-06-15 NOTE — Progress Notes (Signed)
[facility] Progress Note  Patient: [name]    BTD:[id number]  DOA: 06/10/2020     Date of Service: the patient was seen and examined on 06/15/2020  Brief hospital course: [name] is a 79 y.o. female with medical history significant for lung cancer, COPD who presented from home after mechanical fall.  She tripped over her grandchild toy, fell and struck her head against the ground. Endorses left wrist pain and left hip pain. She was in her usual state of health prior to this. Upon arrival at [facility] ED CT head nonacute.  Work-up revealed acute left intratrochanteric femur fracture, and mildly displaced and angulated fracture of the distal radial metaphysis and probable nondisplaced fracture of the ulnar styloid.   Currently plan is monitor postop recovery.  Assessment and Plan: Acute left hip fracture status post mechanical fall at home Underwent surgery. Tolerated very well. Currently pain is well controlled.  Perioperative blood loss anemia. Hemoglobin dropped significantly from her baseline on admission. SP 1 PRBC transfusion. H&H stable.  Monitor. Reticulocyte count normal therefore not suggesting significant acute bleed. Fibrinogen count normal therefore not suggesting any hemolytic process.  Diarrhea from stool softener. Discontinue regimen for now.  Monitor.  Acute left wrist fracture Currently in a splint Pain control as above  AKI likely prerenal Baseline creatinine appears to be 0.8 with GFR greater than 60 Presented with creatinine of 1.5 with GFR 34 Avoid nephrotoxins and dehydration Monitor urine output Treated with IV hydration.  Hyperkalemia Potassium significantly better.  Currently monitor BMP.  Essential hypertension BP is not controlled Resume home oral antihypertensives Monitor vital signs  Chronic anxiety/depression Resume home regimen  History of breast cancer Outpatient follow-up with oncology.  Resume [facility]  outpatient  COPD Resume home regimen  Diet: Heart healthy diet DVT Prophylaxis:   heparin injection 5,000 Units Start: 06/12/20 0600  Advance goals of care discussion: Full code  Family Communication: no family was present at bedside, at the time of interview.   Disposition:  Status is: Inpatient  Remains inpatient appropriate because: Unsafe discharge for now willing to go to SNF.  Dispo:  Patient From: Home  Planned Disposition: [place]  Expected discharge date: 06/17/20  Medically stable for discharge: No  Subjective: No nausea no vomiting.  No fever no chills.  No diarrhea.  No blood in the stool.  Physical Exam:  General: Appear in mild distress, no Rash; Oral Mucosa Clear, moist. no Abnormal Neck Mass Or lumps, Conjunctiva normal  Cardiovascular: S1 and S2 Present, no Murmur, Respiratory: good respiratory effort, Bilateral Air entry present and CTA, no Crackles, no wheezes Abdomen: Bowel Sound present, Soft and no tenderness Extremities: no Pedal edema Neurology: alert and oriented to time, place, and person affect appropriate. no new focal deficit Gait not checked due to patient safety concerns  Vitals:   06/15/20 0431 06/15/20 0440 06/15/20 0820 06/15/20 1414  BP: 128/67 128/67  131/66  Pulse: 65 65 68 70  Resp: 17 17 18 18   Temp: 97.7 F (36.5 C) 97.7 F (36.5 C)  98.4 F (36.9 C)  TempSrc: Oral Oral    SpO2: 93% 93% 95% 99%  Weight:      Height:        Intake/Output Summary (Last 24 hours) at 06/15/2020 1910 Last data filed at 06/15/2020 1630 Gross per 24 hour  Intake 340 ml  Output 400 ml  Net -60 ml   Filed Weights   06/11/20 1300 06/12/20 1113  Weight:  63.5 kg 63.5 kg    Data Reviewed: I have personally reviewed and interpreted daily labs, tele strips, imagings as discussed above. I reviewed all nursing notes, pharmacy notes, vitals, pertinent old records I have discussed plan of care as described above with RN and  patient/family.  CBC: Recent Labs  Lab 06/11/20 0818 06/11/20 1754 06/12/20 1805 06/13/20 0459 06/14/20 0312 06/14/20 1609 06/15/20 0259  WBC 6.5   < > 8.0 7.0 6.2 6.9 5.8  NEUTROABS 4.6  --   --   --   --  4.6  --   HGB 7.8*   < > 9.0* 8.4* 7.7* 8.5* 8.1*  HCT 26.6*   < > 29.6* 27.3* 25.4* 28.7* 26.8*  MCV 85.0   < > 84.8 85.0 86.1 85.4 86.2  PLT 245   < > 225 217 214 242 238   < > = values in this interval not displayed.   Basic Metabolic Panel: Recent Labs  Lab 06/11/20 0818 06/12/20 0831 06/13/20 0459 06/14/20 0312 06/15/20 0259  NA 141 140 139 140 142  K 4.9 5.1 4.2 3.5 3.6  CL 108 110 108 107 108  CO2 24 23 24 25 24   GLUCOSE 99 91 88 100* 104*  BUN 22 20 24* 25* 21  CREATININE 1.49* 1.51* 1.40* 1.37* 1.15*  CALCIUM 8.4* 8.3* 8.3* 8.0* 8.3*    Studies: No results found.  Scheduled Meds: . acetaminophen  1,000 mg Oral Q8H  . amLODipine  5 mg Oral Daily  . atorvastatin  20 mg Oral QPM  . Chlorhexidine Gluconate Cloth  6 each Topical Q0600  . cholecalciferol  2,000 Units Oral BID  . ferrous sulfate  325 mg Oral Q breakfast  . fluticasone furoate-vilanterol  1 puff Inhalation Daily  . gabapentin  400 mg Oral QID  . heparin injection (subcutaneous)  5,000 Units Subcutaneous Q8H  . metoprolol tartrate  12.5 mg Oral BID  . mirtazapine  7.5 mg Oral QHS  . multivitamin with minerals  1 tablet Oral Daily  . mupirocin ointment  1 application Nasal BID  . oxybutynin  5 mg Oral QHS  . pantoprazole  40 mg Oral Q0600  . sertraline  100 mg Oral Daily  . umeclidinium bromide  1 puff Inhalation Daily  . Vitamin D (Ergocalciferol)  50,000 Units Oral Q7 days   Continuous Infusions:  PRN Meds: albuterol, diazepam, HYDROmorphone (DILAUDID) injection, methocarbamol, ondansetron, oxyCODONE  Time spent: 35 minutes  Author: Berle Mull, MD [facility] 06/15/2020 7:10 PM  To reach On-call, see care teams to locate the attending and reach out via  [email or web address]. Between 7PM-7AM, please contact night-coverage If you still have difficulty reaching the attending provider, please page the [facility] (Director on Call) for [facility] on amion for assistance.

## 2020-06-15 NOTE — TOC Progression Note (Signed)
Transition of Care [facility]) - Progression Note    Patient Details  Name: [name] MRN: [id number] Date of Birth: [birth year]  Transition of Care [facility]) CM/SW [place], [place] Phone Number: 06/15/2020, 3:24 PM  Clinical Narrative:     CSW met with pt and presented offers. Pt chooses [facility]. Pt states she has been there before. Pt consents to [place] updating son. CSW called pt son who is agreeable to plan. CSW confirmed with [facility].   Auth started. CSH#[id number]   Expected Discharge Plan: [place] Barriers to Discharge: SNF Pending bed offer  Expected Discharge Plan and Services Expected Discharge Plan: [place] arrangements for the past 2 months: Single Family Home                                       Social Determinants of Health (SDOH) Interventions    Readmission Risk Interventions No flowsheet data found.

## 2020-06-15 NOTE — Plan of Care (Signed)
  Problem: Activity: Goal: Ability to ambulate and perform ADLs will improve Outcome: Progressing   Problem: Pain Management: Goal: Pain level will decrease Outcome: Progressing   Problem: Education: Goal: Knowledge of General Education information will improve Description: Including pain rating scale, medication(s)/side effects and non-pharmacologic comfort measures Outcome: Progressing

## 2020-06-15 NOTE — TOC Initial Note (Signed)
Transition of Care [facility]) - Initial/Assessment Note    Patient Details  Name: [name] MRN: [id number] Date of Birth: 1940/10/03  Transition of Care [facility]) CM/SW Contact:    Bethann [place], [place] Phone Number: 06/15/2020, 12:36 PM  Clinical Narrative:                 CSW met with pt for SNF consult. Pt lives at home with her son. She states her son and her nephew who lives next door were going to take care of her but now she is agreeable to SNF. She has a preference for facilities closer to her home. CSW explained bed offer and insurance auth process. Fl2 complete and bed requests sent. Will need to upload additional documents to [facility].   Expected Discharge Plan: Skilled Nursing Facility Barriers to Discharge: SNF Pending bed offer   Patient Goals and CMS Choice Patient states their goals for this hospitalization and ongoing recovery are:: SNF CMS Medicare.gov Compare Post Acute Care list provided to:: Patient Choice offered to / list presented to : Patient  Expected Discharge Plan and Services Expected Discharge Plan: [place]       Living arrangements for the past 2 months: Single Family Home                                      Prior Living Arrangements/Services Living arrangements for the past 2 months: Single Family Home Lives with:: Adult Children Patient language and need for interpreter reviewed:: Yes Do you feel safe going back to the place where you live?: Yes      Need for Family Participation in Patient Care: No (Comment) Care giver support system in place?: Yes (comment)   Criminal Activity/Legal Involvement Pertinent to Current Situation/Hospitalization: No - Comment as needed  Activities of Daily Living Home Assistive Devices/Equipment: Wheelchair, Environmental consultant (specify type) ADL Screening (condition at time of admission) Patient's cognitive ability adequate to safely complete daily activities?: Yes Is the patient deaf or have  difficulty hearing?: Yes Does the patient have difficulty seeing, even when wearing glasses/contacts?: No Does the patient have difficulty concentrating, remembering, or making decisions?: No Patient able to express need for assistance with ADLs?: Yes Does the patient have difficulty dressing or bathing?: No Independently performs ADLs?: Yes (appropriate for developmental age) Does the patient have difficulty walking or climbing stairs?: No Weakness of Legs: Left Weakness of Arms/Hands: Left  Permission Sought/Granted                  Emotional Assessment Appearance:: Appears stated age Attitude/Demeanor/Rapport: Engaged Affect (typically observed): Accepting, Adaptable, Pleasant Orientation: : Oriented to Self, Oriented to Place, Oriented to  Time, Oriented to Situation Alcohol / Substance Use: Not Applicable Psych Involvement: No (comment)  Admission diagnosis:  Closed fracture of trochanter of left femur, initial encounter ([place]) [S72.102A] S/p left hip fracture [Z87.81] Patient Active Problem List   Diagnosis Date Noted  . Closed fracture of left distal radius 06/13/2020  . Vitamin D insufficiency 06/13/2020  . Greater trochanter fracture ([place]), left 06/11/2020  . Cough   . Acute metabolic encephalopathy 26/71/2458  . S/P total knee replacement 05/27/2019  . 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 Bilateral renal cysts 05/21/2018  . Hiatal hernia 05/21/2018  . 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 Thoracic radiculitis (Bilateral) (L>R) 05/03/2018  . Chronic thoracic back pain (Left) 05/03/2018  .  Injury of peripheral nerves of thorax, sequela 03/22/2018  . Chest pain 03/21/2018  . Rib pain (Left) 03/21/2018  . Chronic rib pain (Right) 03/20/2018  . DDD (degenerative disc disease), lumbar 03/20/2018  . Chronic fracture of pubic ramus, sequela (Right) 03/20/2018  . History of femoral neck fracture, sequela (Left) 03/20/2018    Class: History of   . Lumbar facet arthropathy (Bilateral) 03/20/2018  . Lumbar facet syndrome (Bilateral) 03/20/2018  . Osteoarthritis of facet joint of lumbar spine 03/20/2018  . Spondylosis without myelopathy or radiculopathy, lumbosacral region 03/20/2018  . Other specified dorsopathies, sacral and sacrococcygeal region 03/20/2018  . Chronic pain syndrome 02/26/2018  . Cancer related pain 02/26/2018  . 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 Chronic low back pain (Secondary Area of Pain) (Bilateral) 02/26/2018  . 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 Chronic lower extremity pain [facility] Area of Pain) (Bilateral) (R>L) 02/26/2018  . Chronic hip pain (Fourth Area of Pain) (Bilateral) (R>L) 02/26/2018  . Neurogenic pain 02/26/2018  . Pharmacologic therapy 02/26/2018  . Problems influencing health status 02/26/2018  . Long term prescription benzodiazepine use 02/26/2018  . Long term prescription opiate use 02/26/2018  . Acute systolic heart failure ([place])   . Non-ST elevation (NSTEMI) myocardial infarction ([place])   . History of uterine cancer 12/08/2017  . Generalized anxiety disorder 12/08/2017  . GERD (gastroesophageal reflux disease) 12/08/2017  . Substernal chest pain 12/08/2017  . Elevated brain natriuretic peptide (BNP) level 12/08/2017  . Elevated troponin 12/08/2017  . Embedded tick of upper back excluding scapular region 12/08/2017  . Chronic sacroiliac joint pain 06/21/2017  . Disorder of skeletal system 06/21/2017  . Other long term (current) drug therapy 06/21/2017  . Other specified health status 06/21/2017  . Chronic chest wall pain (Primary Area of Pain) 06/21/2017  . Chronic post-thoracotomy pain 03/03/2017  . 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 Adenocarcinoma of right lung, stage 1 ([place]) 07/28/2016  . S/P lobectomy of lung 05/09/2016  . DVT (deep venous thrombosis) ([place]) 04/26/2016  . Heart murmur    . Acute exacerbation of chronic obstructive pulmonary disease (COPD) ([place]) 08/29/2015  . Essential hypertension 08/29/2015  . Hypercholesterolemia 08/29/2015  . CKD (chronic kidney disease), stage III ([place]) 08/29/2015  . COPD (chronic obstructive pulmonary disease) ([place]) 04/27/2015  . Acute on chronic respiratory failure with hypoxia ([place]) 04/27/2015  . Infected sebaceous cyst 08/16/2011   PCP:  Wenda Low, MD Pharmacy:   [place], [place], [address], [place] [address] Phone: [phone] Fax: [phone]  [facility] - [place], [place] - [address] [place] [address] Phone: [phone] Fax: [phone]     Social Determinants of Health (SDOH) Interventions    Readmission Risk Interventions No flowsheet data found.

## 2020-06-15 NOTE — Progress Notes (Signed)
Physical Therapy Treatment Patient Details Name: [name] MRN: [id number] DOB: 1940-12-20 Today's Date: 06/15/2020    History of Present Illness Pt is a 79 y/o female admitted after a mechanical fall at home in which she sustained a L wrist fx and a L greater trochanter fx. Pt is s/p ORIF left distal radius fracture. Ortho decided to manage her L LE fx non-operatively. PMH including but not limited to COPD, CAD and HTN.    PT Comments    Patient progressing towards physical therapy goals. Patient required modA for bed mobility and minA+2 for sit to stand transfer with HHAx1. Patient performed stand pivot with minA+2 and HHAx1, cues for sequencing and safety. Patient continues to be limited by pain, decreased activity tolerance, impaired balance, generalized weakness, and impaired functional mobility. Continue to recommend SNF for ongoing Physical Therapy.      Follow Up Recommendations  SNF;Supervision/Assistance - 24 hour     Equipment Recommendations  Other (comment) (platform attachment for a RW if pt d/c's home)    Recommendations for Other Services       Precautions / Restrictions Precautions Precautions: Fall Restrictions Weight Bearing Restrictions: Yes LUE Weight Bearing: Weight bear through elbow only LLE Weight Bearing: Weight bearing as tolerated    Mobility  Bed Mobility Overal bed mobility: Needs Assistance Bed Mobility: Supine to Sit     Supine to sit: Mod assist     General bed mobility comments: use of bed rails, increased time and effort needed  Transfers Overall transfer level: Needs assistance Equipment used: 1 person hand held assist   Sit to Stand: Min assist;+2 physical assistance;+2 safety/equipment Stand pivot transfers: Min assist;+2 physical assistance;+2 safety/equipment          Ambulation/Gait                 Stairs             Wheelchair Mobility    Modified Rankin (Stroke Patients Only)       Balance  Overall balance assessment: Needs assistance;History of Falls Sitting-balance support: Feet supported Sitting balance-Leahy Scale: Poor Sitting balance - Comments: patient demos posterior lean during sitting EOB, required up to modA to recover Postural control: Posterior lean Standing balance support: Bilateral upper extremity supported Standing balance-Leahy Scale: Poor                              Cognition Arousal/Alertness: Awake/alert Behavior During Therapy: Impulsive;Restless;Anxious Overall Cognitive Status: Within Functional Limits for tasks assessed                                 General Comments: cognition not formally assessed but [facility] for general conversations      Exercises General Exercises - Lower Extremity Ankle Circles/Pumps: Both;10 reps Straight Leg Raises: Both;10 reps    General Comments        Pertinent Vitals/Pain Pain Assessment: Faces Faces Pain Scale: Hurts even more Pain Location: L wrist, L LE with movement Pain Descriptors / Indicators: Grimacing;Guarding Pain Intervention(s): Monitored during session;Repositioned    Home Living                      Prior Function            PT Goals (current goals can now be found in the care plan section) Acute Rehab PT Goals Patient  Stated Goal: to go home PT Goal Formulation: With patient Time For Goal Achievement: 06/27/20 Potential to Achieve Goals: Good Progress towards PT goals: Progressing toward goals    Frequency    Min 3X/week      PT Plan Current plan remains appropriate    Co-evaluation              AM-PAC PT "6 Clicks" Mobility   Outcome Measure  Help needed turning from your back to your side while in a flat bed without using bedrails?: A Little Help needed moving from lying on your back to sitting on the side of a flat bed without using bedrails?: A Lot Help needed moving to and from a bed to a chair (including a wheelchair)?: A  Little Help needed standing up from a chair using your arms (e.g., wheelchair or bedside chair)?: A Little Help needed to walk in hospital room?: A Lot Help needed climbing 3-5 steps with a railing? : Total 6 Click Score: 14    End of Session Equipment Utilized During Treatment: Gait belt Activity Tolerance: Patient limited by pain;Patient limited by fatigue Patient left: in chair;with call bell/phone within reach;with chair alarm set Nurse Communication: Mobility status PT Visit Diagnosis: Other abnormalities of gait and mobility (R26.89);Pain Pain - Right/Left: Left Pain - part of body: Arm;Leg     Time: 0569-7948 PT Time Calculation (min) (ACUTE ONLY): 20 min  Charges:  $Therapeutic Activity: 8-22 mins                     [name], PT, DPT Acute Rehabilitation Services Pager [phone] Office [phone]    [name] 06/15/2020, 3:16 PM

## 2020-06-15 NOTE — Progress Notes (Signed)
       RE:   [name]      Date of Birth:  11-21-1940     Date:   06/15/20       To Whom It May Concern:  Please be advised that the above-named patient will require a short-term nursing home stay - anticipated 30 days or less for rehabilitation and strengthening.  The plan is for return home.

## 2020-06-16 DIAGNOSIS — S72102A Unspecified trochanteric fracture of left femur, initial encounter for closed fracture: Secondary | ICD-10-CM | POA: Diagnosis not present

## 2020-06-16 LAB — CBC
HCT: 28 % — ABNORMAL LOW (ref 36.0–46.0)
Hemoglobin: 8.4 g/dL — ABNORMAL LOW (ref 12.0–15.0)
MCH: 26 pg (ref 26.0–34.0)
MCHC: 30 g/dL (ref 30.0–36.0)
MCV: 86.7 fL (ref 80.0–100.0)
Platelets: 225 10*3/uL (ref 150–400)
RBC: 3.23 MIL/uL — ABNORMAL LOW (ref 3.87–5.11)
RDW: 19.6 % — ABNORMAL HIGH (ref 11.5–15.5)
WBC: 6.4 10*3/uL (ref 4.0–10.5)
nRBC: 0 % (ref 0.0–0.2)

## 2020-06-16 LAB — BASIC METABOLIC PANEL
Anion gap: 10 (ref 5–15)
BUN: 19 mg/dL (ref 8–23)
CO2: 26 mmol/L (ref 22–32)
Calcium: 8.4 mg/dL — ABNORMAL LOW (ref 8.9–10.3)
Chloride: 104 mmol/L (ref 98–111)
Creatinine, Ser: 1.07 mg/dL — ABNORMAL HIGH (ref 0.44–1.00)
GFR, Estimated: 53 mL/min — ABNORMAL LOW (ref >60)
Glucose, Bld: 109 mg/dL — ABNORMAL HIGH (ref 70–99)
Potassium: 4 mmol/L (ref 3.5–5.1)
Sodium: 140 mmol/L (ref 135–145)

## 2020-06-16 LAB — RESPIRATORY PANEL BY RT PCR (FLU A&B, COVID)
Influenza A by PCR: NEGATIVE
Influenza B by PCR: NEGATIVE
SARS Coronavirus 2 by RT PCR: NEGATIVE

## 2020-06-16 LAB — HAPTOGLOBIN: Haptoglobin: 200 mg/dL (ref 42–346)

## 2020-06-16 MED ORDER — VITAMIN D (ERGOCALCIFEROL) 1.25 MG (50000 UNIT) PO CAPS
50000.0000 [IU] | ORAL_CAPSULE | ORAL | 0 refills | Status: DC
Start: 2020-06-20 — End: 2021-05-12

## 2020-06-16 MED ORDER — DIAZEPAM 5 MG PO TABS: 2.5000 mg | ORAL_TABLET | Freq: Three times a day (TID) | ORAL | 0 refills | Status: DC | PRN

## 2020-06-16 MED ORDER — TRAMADOL HCL 50 MG PO TABS: 50.0000 mg | ORAL_TABLET | Freq: Three times a day (TID) | ORAL | 0 refills | Status: DC | PRN

## 2020-06-16 MED ORDER — FERROUS SULFATE 325 (65 FE) MG PO TABS
325.0000 mg | ORAL_TABLET | Freq: Every day | ORAL | 0 refills | Status: DC
Start: 2020-06-16 — End: 2022-01-18

## 2020-06-16 MED ORDER — ADULT MULTIVITAMIN W/MINERALS CH
1.0000 | ORAL_TABLET | Freq: Every day | ORAL | 0 refills | Status: DC
Start: 2020-06-17 — End: 2022-01-18

## 2020-06-16 NOTE — Plan of Care (Signed)
  Problem: Education: Goal: Verbalization of understanding the information provided (i.e., activity precautions, restrictions, etc) will improve Outcome: Progressing Goal: Individualized Educational Video(s) Outcome: Progressing   Problem: Activity: Goal: Ability to ambulate and perform ADLs will improve Outcome: Progressing   Problem: Clinical Measurements: Goal: Postoperative complications will be avoided or minimized Outcome: Progressing   Problem: Self-Concept: Goal: Ability to maintain and perform role responsibilities to the fullest extent possible will improve Outcome: Progressing   Problem: Pain Management: Goal: Pain level will decrease Outcome: Progressing   Problem: Education: Goal: Knowledge of General Education information will improve Description: Including pain rating scale, medication(s)/side effects and non-pharmacologic comfort measures Outcome: Progressing   Problem: Health Behavior/Discharge Planning: Goal: Ability to manage health-related needs will improve Outcome: Progressing   Problem: Clinical Measurements: Goal: Ability to maintain clinical measurements within normal limits will improve Outcome: Progressing Goal: Will remain free from infection Outcome: Progressing Goal: Diagnostic test results will improve Outcome: Progressing Goal: Respiratory complications will improve Outcome: Progressing Goal: Cardiovascular complication will be avoided Outcome: Progressing   Problem: Activity: Goal: Risk for activity intolerance will decrease Outcome: Progressing   Problem: Nutrition: Goal: Adequate nutrition will be maintained Outcome: Progressing   Problem: Coping: Goal: Level of anxiety will decrease Outcome: Progressing   Problem: Elimination: Goal: Will not experience complications related to bowel motility Outcome: Progressing Goal: Will not experience complications related to urinary retention Outcome: Progressing   Problem: Pain  Managment: Goal: General experience of comfort will improve Outcome: Progressing   Problem: Safety: Goal: Ability to remain free from injury will improve Outcome: Progressing   Problem: Skin Integrity: Goal: Risk for impaired skin integrity will decrease Outcome: Progressing   Problem: Education: Goal: Knowledge of General Education information will improve Description: Including pain rating scale, medication(s)/side effects and non-pharmacologic comfort measures Outcome: Progressing   Problem: Health Behavior/Discharge Planning: Goal: Ability to manage health-related needs will improve Outcome: Progressing   Problem: Clinical Measurements: Goal: Ability to maintain clinical measurements within normal limits will improve Outcome: Progressing Goal: Will remain free from infection Outcome: Progressing Goal: Diagnostic test results will improve Outcome: Progressing Goal: Respiratory complications will improve Outcome: Progressing Goal: Cardiovascular complication will be avoided Outcome: Progressing   Problem: Activity: Goal: Risk for activity intolerance will decrease Outcome: Progressing   Problem: Nutrition: Goal: Adequate nutrition will be maintained Outcome: Progressing   Problem: Coping: Goal: Level of anxiety will decrease Outcome: Progressing   Problem: Elimination: Goal: Will not experience complications related to bowel motility Outcome: Progressing Goal: Will not experience complications related to urinary retention Outcome: Progressing   Problem: Pain Managment: Goal: General experience of comfort will improve Outcome: Progressing   Problem: Safety: Goal: Ability to remain free from injury will improve Outcome: Progressing   Problem: Skin Integrity: Goal: Risk for impaired skin integrity will decrease Outcome: Progressing

## 2020-06-16 NOTE — Discharge Summary (Signed)
[facility] Discharge Summary   Patient: [name] EPP:[id number]  PCP: Wenda Low, MD  Date of admission: 06/10/2020   Date of discharge:  06/16/2020     Discharge Diagnoses:  Principal diagnosis Left distal radius fracture and left greater trochanter fracture Active Problems:   Long term prescription opiate use   Greater trochanter fracture ([place]), left   Closed fracture of left distal radius   Vitamin D insufficiency   Admitted From: home Disposition:  SNF   Recommendations for Outpatient Follow-up:  1. PCP: please follow up with PCP and Orthopedics as recommended 2. Follow up LABS/TEST:  none   Contact information for follow-up providers    Wenda Low, MD. Schedule an appointment as soon as possible for a visit in 1 week(s).   Specialty: Internal Medicine Contact information: [address] [place] [address] [place] [address] [phone]        Shona Needles, MD. Schedule an appointment as soon as possible for a visit in 1 week(s).   Specialty: Orthopedic Surgery Contact information: [place] [address] [phone]            Contact information for after-discharge care    Destination    [facility] .   Service: Skilled Nursing Contact information: [place] [phone]                 Diet recommendation: Cardiac diet  Activity: The patient is advised to gradually reintroduce usual activities, as tolerated  Discharge Condition: stable  Code Status: Full code   History of present illness: As per the H and P dictated on admission, "[name] is a 79 y.o. female with medical history significant for lung cancer, COPD who presented from home after mechanical fall.  She tripped over her grandchild toy, fell and struck her head against the ground.  Endorses left wrist pain and left hip pain.  She was in her usual state of health prior to  this.  Upon arrival at [facility] ED CT head nonacute.  Work-up revealed acute left intratrochanteric femur fracture, and mildly displaced and angulated fracture of the distal radial metaphysis and probable nondisplaced fracture of the ulnar styloid.  TRH, hospitalist team, asked to admit.  ED Course: Elevated BP likely contributed by pain.  Lab studies remarkable for elevated potassium 5.5, creatinine 1.55 with GFR of 34, baseline creatinine is 0.8 with GFR greater than 60.  Drop of hemoglobin 9.3 with baseline hemoglobin of 10"  Hospital Course:  Summary of her active problems in the hospital is as following. Acute left hip fracture status post mechanical fall at home L distal radius fractire s/p ORIF  Hip fracture managed non-operatively.   Weightbearing: WBAT LLE, WB thru L elbow, NWB thru wrist/hand Incisional and dressing care:  Splint LUE x 2 weeks Showering: Ok to shower with assistance. Keep LUE splint dry Orthopedic device(s): Splint LUE Currently pain is well controlled.  Perioperative blood loss anemia. Hemoglobin dropped significantly from her baseline on admission. SP 1 PRBC transfusion. H&H stable.  Monitor. Reticulocyte count normal therefore not suggesting significant acute bleed. Fibrinogen count normal therefore not suggesting any hemolytic process.  Diarrhea from stool softener. Resolved  Discontinue regimen for now.  Monitor.  AKI likely prerenal Baseline creatinine appears to be 0.8 with GFR greater than 60 Presented with creatinine of 1.5 with GFR 34 Treated with IV hydration.  Hyperkalemia Potassium significantly better  Essential hypertension Resume home  oral antihypertensives  Chronic anxiety/depression Resume home regimen  History of breast cancer Outpatient follow-up with oncology.  Resume [facility] outpatient  COPD Resume home regimen  Pain control  - [place] Controlled Substance Reporting System database was reviewed. - 2 day  supply was provided. - Patient was instructed, not to drive, operate heavy machinery, perform activities at heights, swimming or participation in water activities or provide baby sitting services while on Pain, Sleep and Anxiety Medications; until her outpatient Physician has advised to do so again.  - Also recommended to not to take more than prescribed Pain, Sleep and Anxiety Medications.  Patient was seen by physical therapy, who recommended SNF,  was arranged. On the day of the discharge the patient's vitals were stable, and no other acute medical condition were reported by patient. The patient was felt safe to be discharge at SNF with therapy.  Consultants: Orthopedics  Procedures: ORIF of distal radius fracture  Discharge Exam: General: Appear in no distress, no Rash; Oral Mucosa Clear, moist. no Abnormal Neck Mass Or lumps, Conjunctiva normal  Cardiovascular: S1 and S2 Present, no Murmur Respiratory: good respiratory effort, Bilateral Air entry present and CTA, no Crackles, no wheezes Abdomen: Bowel Sound present, Soft and no tenderness Extremities: no Pedal edema Neurology: alert and oriented to time, place, and person affect appropriate. no new focal deficit  Filed Weights   06/11/20 1300 06/12/20 1113  Weight: 63.5 kg 63.5 kg   Vitals:   06/16/20 0420 06/16/20 0848  BP: (!) 150/67   Pulse: 75 70  Resp: 18 18  Temp: 98 F (36.7 C)   SpO2: 96% 95%    DISCHARGE MEDICATION: Allergies as of 06/16/2020      Reactions   Boniva [ibandronate Sodium] Other (See Comments)   UNSPECIFIED REACTION    Lyrica [pregabalin] Nausea And Vomiting   Dizziness and blurred vision as well   Levofloxacin    Pt states she isn't sure       Medication List    STOP taking these medications   lisinopril 10 MG tablet Commonly known as: ZESTRIL   ondansetron 4 MG disintegrating tablet Commonly known as: Zofran ODT   oxyCODONE 5 MG immediate release tablet Commonly known as: Oxy  IR/ROXICODONE     TAKE these medications   acetaminophen 500 MG tablet Commonly known as: TYLENOL Take 2 tablets (1,000 mg total) by mouth every 6 (six) hours. What changed:   when to take this  reasons to take this   albuterol 108 (90 Base) MCG/ACT inhaler Commonly known as: VENTOLIN HFA Inhale 2 puffs into the lungs every 6 (six) hours as needed. What changed:   when to take this  reasons to take this   albuterol (2.5 MG/3ML) 0.083% nebulizer solution Commonly known as: PROVENTIL Take 3 mLs (2.5 mg total) by nebulization every 2 (two) hours as needed for wheezing or shortness of breath. What changed: Another medication with the same name was changed. Make sure you understand how and when to take each.   aluminum-magnesium hydroxide-simethicone 294-765-46 MG/5ML Susp Commonly known as: MAALOX Take 30 mLs by mouth 3 (three) times daily as needed (indigestion).   amLODipine 5 MG tablet Commonly known as: NORVASC Take 5 mg by mouth daily.   aspirin 325 MG EC tablet Take 1 tablet (325 mg total) by mouth 2 (two) times daily. What changed: how much to take   atorvastatin 20 MG tablet Commonly known as: LIPITOR Take 20 mg by mouth every evening.  diazepam 5 MG tablet Commonly known as: VALIUM Take 0.5 tablets (2.5 mg total) by mouth every 8 (eight) hours as needed for anxiety. Must last 30 days.   ferrous sulfate 325 (65 FE) MG tablet Take 1 tablet (325 mg total) by mouth daily with breakfast.   furosemide 20 MG tablet Commonly known as: LASIX Take 20 mg by mouth every Monday, Wednesday, and Friday. Monday and Wednesday   gabapentin 400 MG capsule Commonly known as: NEURONTIN Take 1 capsule (400 mg total) by mouth 4 (four) times daily. What changed: when to take this   meloxicam 7.5 MG tablet Commonly known as: MOBIC Take 7.5 mg by mouth daily.   metoCLOPramide 5 MG tablet Commonly known as: REGLAN Take 5 mg by mouth 2 (two) times daily.   metoprolol  tartrate 25 MG tablet Commonly known as: LOPRESSOR Take 0.5 tablets (12.5 mg total) by mouth 2 (two) times daily.   mirtazapine 7.5 MG tablet Commonly known as: REMERON Take 7.5 mg by mouth at bedtime.   multivitamin with minerals Tabs tablet Take 1 tablet by mouth daily. Start taking on: June 17, 2020   oxybutynin 5 MG 24 hr tablet Commonly known as: DITROPAN-XL Take 5 mg by mouth at bedtime.   pantoprazole 40 MG tablet Commonly known as: PROTONIX Take 1 tablet (40 mg total) by mouth daily at 6 (six) AM.   raloxifene 60 MG tablet Commonly known as: EVISTA Take 60 mg by mouth daily.   sertraline 100 MG tablet Commonly known as: ZOLOFT Take 100 mg by mouth in the morning and at bedtime.   tiZANidine 2 MG tablet Commonly known as: ZANAFLEX Take 1 tablet (2 mg total) by mouth every 6 (six) hours as needed. What changed: reasons to take this   traMADol 50 MG tablet Commonly known as: ULTRAM Take 1 tablet (50 mg total) by mouth 3 (three) times daily as needed for moderate pain.   Trelegy Ellipta 100-62.5-25 MCG/INH Aepb Generic drug: Fluticasone-Umeclidin-Vilant Inhale 1 puff into the lungs daily.   Vitamin D (Ergocalciferol) 1.25 MG (50000 UNIT) Caps capsule Commonly known as: DRISDOL Take 1 capsule (50,000 Units total) by mouth every 7 (seven) days. Start taking on: June 20, 2020            Discharge Care Instructions  (From admission, onward)         Start     Ordered   06/16/20 0000  Leave dressing on - Keep it clean, dry, and intact until clinic visit        06/16/20 1106         Allergies  Allergen Reactions  . Boniva [Ibandronate Sodium] Other (See Comments)    UNSPECIFIED REACTION   . Lyrica [Pregabalin] Nausea And Vomiting    Dizziness and blurred vision as well  . Levofloxacin     Pt states she isn't sure    Discharge Instructions    Diet - low sodium heart healthy   Complete by: As directed    Increase activity slowly   Complete  by: As directed    Leave dressing on - Keep it clean, dry, and intact until clinic visit   Complete by: As directed       The results of significant diagnostics from this hospitalization (including imaging, microbiology, ancillary and laboratory) are listed below for reference.    Significant Diagnostic Studies: DG Wrist 2 Views Left  Result Date: 06/12/2020 CLINICAL DATA:  ORIF of left distal radius fracture. EXAM: LEFT WRIST -  2 VIEW COMPARISON:  None. FINDINGS: The patient is status post left distal radius fracture ORIF. Hardware is in good position. Alignment is significantly improved. IMPRESSION: ORIF left distal radius fracture. Electronically Signed   By: Dorise Bullion III M.D   On: 06/12/2020 16:52   DG Wrist Complete Left  Result Date: 06/12/2020 CLINICAL DATA:  ORIF of left wrist. FLUOROSCOPY TIME:  33 seconds. Images: 5 EXAM: LEFT WRIST - COMPLETE 3+ VIEW COMPARISON:  None. FINDINGS: Five images were obtained during left wrist ORIF. By the end of the study, hardware is in good position and alignment is near anatomic. IMPRESSION: Left wrist ORIF as above. Electronically Signed   By: Dorise Bullion III M.D   On: 06/12/2020 16:51   DG Wrist Complete Left  Result Date: 06/10/2020 CLINICAL DATA:  79 year old female with fall and left wrist pain. EXAM: LEFT WRIST - COMPLETE 3+ VIEW COMPARISON:  Left hand radiograph dated 10/10/2018. FINDINGS: There is a mildly displaced and impacted fracture of the distal radial metaphysis with approximately 4 mm dorsal displacement and angulation of the distal fracture fragment. Probable nondisplaced fracture of the ulnar styloid. There is no dislocation. There is widening of the scapholunate distance. Advanced osteopenia. The soft tissue swelling of the wrist. IMPRESSION: Mildly displaced and angulated fracture of the distal radial metaphysis and probable nondisplaced fracture of the ulnar styloid. Electronically Signed   By: Anner Crete M.D.    On: 06/10/2020 17:15   CT HEAD WO CONTRAST  Result Date: 06/10/2020 CLINICAL DATA:  Fall facial trauma EXAM: CT HEAD WITHOUT CONTRAST TECHNIQUE: Contiguous axial images were obtained from the base of the skull through the vertex without intravenous contrast. COMPARISON:  September 14, 2019 FINDINGS: Brain: No evidence of acute territorial infarction, hemorrhage, hydrocephalus,extra-axial collection or mass lesion/mass effect. There is dilatation the ventricles and sulci consistent with age-related atrophy. Low-attenuation changes in the deep white matter consistent with small vessel ischemia. Vascular: No hyperdense vessel or unexpected calcification. Skull: The skull is intact. No fracture or focal lesion identified. Sinuses/Orbits: The visualized paranasal sinuses and mastoid air cells are clear. The orbits and globes intact. Other: None IMPRESSION: No acute intracranial abnormality. Findings consistent with age related atrophy and chronic small vessel ischemia Electronically Signed   By: Prudencio Pair M.D.   On: 06/10/2020 21:26   CT HIP LEFT WO CONTRAST  Result Date: 06/11/2020 CLINICAL DATA:  Evaluate left hip fracture EXAM: CT OF THE LEFT HIP WITHOUT CONTRAST TECHNIQUE: Multidetector CT imaging of the left hip was performed according to the standard protocol. Multiplanar CT image reconstructions were also generated. COMPARISON:  X-ray 06/10/2020 FINDINGS: Bones/Joint/Cartilage Patient is status post prior left hip ORIF via 3 cannulated partially threaded screws. Hardware alignment remains unchanged from prior. There is an acute fracture involving the tip of the left greater trochanter with minimal medial displacement (series 3, images 55-60; series 7, image 63). Fracture lines do not appear to extend into the intertrochanteric portion of the femur. Fracture lines do not appear to definitively extend to the adjacent hardware. Hip joint is intact without dislocation. The visualized portion of the left  hemipelvis is intact. No left SI joint diastasis. No suspicious bone lesion. Ligaments Suboptimally assessed by CT. Muscles and Tendons Musculotendinous structures appear within normal limits. Soft tissues There is mild induration within the subcutaneous soft tissues overlying the lateral aspect of the left hip. No organized soft tissue fluid collection or hematoma. Sigmoid diverticulosis. Atherosclerosis is seen. No left inguinal lymphadenopathy. IMPRESSION: 1. Acute  minimally displaced fracture involving the tip of the left greater trochanter. 2. Status post prior left hip ORIF with 3 cannulated partially threaded screws. Hardware alignment remains unchanged from prior. 3. Mild induration within the subcutaneous soft tissues overlying the lateral aspect of the left hip without organized soft tissue fluid collection or hematoma. Electronically Signed   By: Davina Poke D.O.   On: 06/11/2020 08:05   DG CHEST PORT 1 VIEW  Result Date: 06/11/2020 CLINICAL DATA:  Recent fall. Reported history of previous lung carcinoma EXAM: PORTABLE CHEST 1 VIEW COMPARISON:  January 19, 2020 FINDINGS: Postoperative change with volume loss on the right. No edema or airspace opacity. Heart size and contour within normal limits. No adenopathy. Old healed rib fractures noted on the left. IMPRESSION: Postoperative changes with volume loss on the right. No edema or airspace opacity. Stable cardiac silhouette. No adenopathy evident. Electronically Signed   By: Lowella Grip III M.D.   On: 06/11/2020 16:51   DG C-Arm 1-60 Min  Result Date: 06/12/2020 CLINICAL DATA:  ORIF of left wrist. EXAM: DG C-ARM 1-60 MIN FLUOROSCOPY TIME:  Fluoroscopy Time:  33 seconds Radiation Exposure Index (if provided by the fluoroscopic device): 0.57 mGy Number of Acquired Spot Images: 5 COMPARISON:  None. FINDINGS: Five images were obtained during ORIF of the distal radial fracture. Hardware is in good position by the end of the study. Alignment  appears to be nearly anatomic by the end of the study. IMPRESSION: Left radius ORIF as above. Electronically Signed   By: Dorise Bullion III M.D   On: 06/12/2020 16:50   DG Hip Unilat W or Wo Pelvis 2-3 Views Left  Result Date: 06/10/2020 CLINICAL DATA:  Fall left hip pain EXAM: DG HIP (WITH OR WITHOUT PELVIS) 2-3V LEFT COMPARISON:  June 21, 2017 FINDINGS: The patient is status post screw fixation of the proximal left femur. There appears to be in acute intratrochanteric fracture with mildly displaced fractures through the greater trochanter. Overlying soft tissue swelling is seen. Healed fracture deformity seen at the inferior right pubic rami. There is diffuse osteopenia. IMPRESSION: Acute left intratrochanteric femur fracture Electronically Signed   By: Prudencio Pair M.D.   On: 06/10/2020 21:19    Microbiology: Recent Results (from the past 240 hour(s))  Respiratory Panel by RT PCR (Flu A&B, Covid) - Nasopharyngeal Swab     Status: None   Collection Time: 06/10/20 11:18 PM   Specimen: Nasopharyngeal Swab  Result Value Ref Range Status   SARS Coronavirus 2 by RT PCR NEGATIVE NEGATIVE Final    Comment: (NOTE) SARS-CoV-2 target nucleic acids are NOT DETECTED.  The SARS-CoV-2 RNA is generally detectable in upper respiratoy specimens during the acute phase of infection. The lowest concentration of SARS-CoV-2 viral copies this assay can detect is 131 copies/mL. A negative result does not preclude SARS-Cov-2 infection and should not be used as the sole basis for treatment or other patient management decisions. A negative result may occur with  improper specimen collection/handling, submission of specimen other than nasopharyngeal swab, presence of viral mutation(s) within the areas targeted by this assay, and inadequate number of viral copies (<131 copies/mL). A negative result must be combined with clinical observations, patient history, and epidemiological information. The expected  result is Negative.  Fact Sheet for Patients:  PinkCheek.be  Fact Sheet for Healthcare Providers:  GravelBags.it  This test is no t yet approved or cleared by the [place] FDA and  has been authorized for detection and/or diagnosis of  SARS-CoV-2 by FDA under an Emergency Use Authorization (EUA). This EUA will remain  in effect (meaning this test can be used) for the duration of the COVID-19 declaration under Section 564(b)(1) of the Act, 21 U.S.C. section 360bbb-3(b)(1), unless the authorization is terminated or revoked sooner.     Influenza A by PCR NEGATIVE NEGATIVE Final   Influenza B by PCR NEGATIVE NEGATIVE Final    Comment: (NOTE) The Xpert Xpress SARS-CoV-2/FLU/RSV assay is intended as an aid in  the diagnosis of influenza from Nasopharyngeal swab specimens and  should not be used as a sole basis for treatment. Nasal washings and  aspirates are unacceptable for Xpert Xpress SARS-CoV-2/FLU/RSV  testing.  Fact Sheet for Patients: PinkCheek.be  Fact Sheet for Healthcare Providers: GravelBags.it  This test is not yet approved or cleared by the [place] FDA and  has been authorized for detection and/or diagnosis of SARS-CoV-2 by  FDA under an Emergency Use Authorization (EUA). This EUA will remain  in effect (meaning this test can be used) for the duration of the  Covid-19 declaration under Section 564(b)(1) of the Act, 21  U.S.C. section 360bbb-3(b)(1), unless the authorization is  terminated or revoked. Performed at [facility], [place] [address], [place], [place] [address]   Surgical pcr screen     Status: Abnormal   Collection Time: 06/11/20  8:14 PM   Specimen: Nasal Mucosa; Nasal Swab  Result Value Ref Range Status   MRSA, PCR POSITIVE (A) NEGATIVE Final    Comment: RESULT CALLED TO, READ BACK BY AND VERIFIED WITH: A LOWE RN 06/11/20  AT 2337 SK    Staphylococcus aureus POSITIVE (A) NEGATIVE Final    Comment: RESULT CALLED TO, READ BACK BY AND VERIFIED WITH: A LOWE RN 06/11/20 AT 2337 SK (NOTE) The Xpert SA Assay (FDA approved for NASAL specimens in patients 33 years of age and older), is one component of a comprehensive surveillance program. It is not intended to diagnose infection nor to guide or monitor treatment. Performed at [facility], [place] [address] [place], [place] [address]      Labs: CBC: Recent Labs  Lab 06/11/20 0818 06/11/20 1754 06/13/20 0459 06/14/20 0312 06/14/20 1609 06/15/20 0259 06/16/20 0148  WBC 6.5   < > 7.0 6.2 6.9 5.8 6.4  NEUTROABS 4.6  --   --   --  4.6  --   --   HGB 7.8*   < > 8.4* 7.7* 8.5* 8.1* 8.4*  HCT 26.6*   < > 27.3* 25.4* 28.7* 26.8* 28.0*  MCV 85.0   < > 85.0 86.1 85.4 86.2 86.7  PLT 245   < > 217 214 242 238 225   < > = values in this interval not displayed.   Basic Metabolic Panel: Recent Labs  Lab 06/12/20 0831 06/13/20 0459 06/14/20 0312 06/15/20 0259 06/16/20 0148  NA 140 139 140 142 140  K 5.1 4.2 3.5 3.6 4.0  CL 110 108 107 108 104  CO2 23 24 25 24 26   GLUCOSE 91 88 100* 104* 109*  BUN 20 24* 25* 21 19  CREATININE 1.51* 1.40* 1.37* 1.15* 1.07*  CALCIUM 8.3* 8.3* 8.0* 8.3* 8.4*   Liver Function Tests: Recent Labs  Lab 06/11/20 0818  AST 18  ALT 10  ALKPHOS 45  BILITOT 0.5  PROT 6.3*  ALBUMIN 2.9*   CBG: No results for input(s): GLUCAP in the last 168 hours.  Time spent: 35 minutes  Signed:  Diamantina Providence  Posey Pronto  [facility]  06/16/2020 11:07 AM

## 2020-06-16 NOTE — TOC Transition Note (Signed)
Transition of Care [facility]) - CM/SW Discharge Note   Patient Details  Name: [name] MRN: [id number] Date of Birth: Oct 19, 1940  [facility] CM/SW Contact:  Bethann Berkshire, [place] Phone Number: 06/16/2020, 2:02 PM   Clinical Narrative:    Patient will DC to: [facility] Anticipated DC date: 06/16/20 Family notified: Pt will notify her son Transport by: Corey Harold   Per MD patient ready for DC to [facility]. RN, patient, patient's family, and facility notified of DC. Discharge Summary and FL2 sent to facility. RN to call report prior to discharge ([phone]). DC packet on chart. Ambulance transport requested for patient.   CSW will sign off for now as social work intervention is no longer needed. Please consult [place] again if new needs arise.    Final next level of care: Skilled Nursing Facility Barriers to Discharge: No Barriers Identified   Patient Goals and CMS Choice Patient states their goals for this hospitalization and ongoing recovery are:: SNF CMS Medicare.gov Compare Post Acute Care list provided to:: Patient Choice offered to / list presented to : Patient  Discharge Placement              Patient chooses bed at:  ([facility]) Patient to be transferred to facility by: PTar Name of family member notified: Pt will notify her family Patient and family notified of of transfer: 06/16/20  Discharge Plan and Services                                     Social Determinants of Health (SDOH) Interventions     Readmission Risk Interventions No flowsheet data found.

## 2020-06-16 NOTE — Social Work (Signed)
  Re:  Mare Loan Date of Birth: 1941-07-17 Date: 06/16/2020  To Whom It May Concern:  Please be advised that the above-named patient will require a short-term nursing home stay-anticipated 30 days or less for rehabilitation and strengthening. The plan is to return home.

## 2020-06-16 NOTE — Progress Notes (Signed)
PASSR Received [id number]

## 2020-06-16 NOTE — Progress Notes (Signed)
[name] to be D/C'd  per MD order. Discussed with the patient and all questions fully answered.   An After Visit Summary was printed and given to the [facility] for receiving facility.  D/c education completed with patient/family including follow up instructions, medication list, d/c activities limitations if indicated, with other d/c instructions as indicated by MD - patient able to verbalize understanding, all questions fully answered.   Patient instructed to return to ED, call 911, or call MD for any changes in condition.   Patient to be escorted via [facility]

## 2020-06-16 NOTE — Progress Notes (Signed)
Physical Therapy Treatment Patient Details Name: [name] MRN: [id number] DOB: 1940/10/12 Today's Date: 06/16/2020    History of Present Illness Pt is a 79 y/o female admitted after a mechanical fall at home in which she sustained a L wrist fx and a L greater trochanter fx. Pt is s/p ORIF left distal radius fracture. Ortho decided to manage her L LE fx non-operatively. PMH including but not limited to COPD, CAD and HTN.    PT Comments    Patient up in recliner on arrival and states she has been there for a couple of hours. Patient requested to use BSC, modA+2 for sit to stand transfer and minA+2 for stand pivot transfer with HHAx2. Patient able to ambulate 3' to bed with minA+2 and HHAx2. Patient demonstrates poor sitting balance with feet supported and no UE support, no correction by patient for posterior or lateral LOB, cues required for awareness of sitting balance. Patient continues to be limited by pain, decreased activity tolerance, impaired balance, and generalized weakness. Continue to recommend SNF for ongoing Physical Therapy.      Follow Up Recommendations  SNF;Supervision/Assistance - 24 hour     Equipment Recommendations  Other (comment) (platform attachment for RW if pt d/c's home)    Recommendations for Other Services       Precautions / Restrictions Precautions Precautions: Fall Restrictions Weight Bearing Restrictions: Yes LUE Weight Bearing: Weight bear through elbow only LUE Partial Weight Bearing Percentage or Pounds: non weight bearing wrist LLE Weight Bearing: Weight bearing as tolerated    Mobility  Bed Mobility Overal bed mobility: Needs Assistance Bed Mobility: Sit to Supine       Sit to supine: Mod assist;+2 for physical assistance   General bed mobility comments: assist for trunk and B LEs   Transfers Overall transfer level: Needs assistance Equipment used: 2 person hand held assist Transfers: Sit to/from [employer]  Transfers Sit to Stand: Mod assist;+2 safety/equipment;+2 physical assistance Stand pivot transfers: Min assist;+2 physical assistance;+2 safety/equipment       General transfer comment: patient with flexed posture throughout transfer. Patient required minA+2 for stand pivot recliner>BSC  Ambulation/Gait Ambulation/Gait assistance: Min assist;+2 physical assistance;+2 safety/equipment Gait Distance (Feet): 3 Feet Assistive device: 2 person hand held assist Gait Pattern/deviations: Step-to pattern;Decreased stride length Gait velocity: decreased       Stairs             Wheelchair Mobility    Modified Rankin (Stroke Patients Only)       Balance Overall balance assessment: Needs assistance;History of Falls Sitting-balance support: Feet supported Sitting balance-Leahy Scale: Poor Sitting balance - Comments: min-modA for upright posture sitting EOB with no UE support, patient with no correction of posterior/lateral LOB sitting EOB.  Postural control: Posterior lean Standing balance support: Single extremity supported;During functional activity Standing balance-Leahy Scale: Poor                              Cognition Arousal/Alertness: Awake/alert Behavior During Therapy: Restless;Anxious;Impulsive Overall Cognitive Status: Within Functional Limits for tasks assessed                                 General Comments: cognition not formally assessed but [facility] for general conversations      Exercises General Exercises - Lower Extremity Long Arc Quad: Both;10 reps Hip Flexion/Marching: Both;10 reps    General Comments  Pertinent Vitals/Pain Pain Assessment: Faces Faces Pain Scale: Hurts even more Pain Location: L wrist, L LE with movement Pain Descriptors / Indicators: Grimacing;Guarding Pain Intervention(s): Monitored during session;Repositioned    Home Living                      Prior Function            PT  Goals (current goals can now be found in the care plan section) Acute Rehab PT Goals Patient Stated Goal: to go home PT Goal Formulation: With patient Time For Goal Achievement: 06/27/20 Potential to Achieve Goals: Good Progress towards PT goals: Progressing toward goals    Frequency    Min 3X/week      PT Plan Current plan remains appropriate    Co-evaluation PT/OT/SLP Co-Evaluation/Treatment: Yes Reason for Co-Treatment: For patient/therapist safety;To address functional/ADL transfers PT goals addressed during session: Mobility/safety with mobility;Strengthening/ROM        AM-PAC PT "6 Clicks" Mobility   Outcome Measure  Help needed turning from your back to your side while in a flat bed without using bedrails?: A Little Help needed moving from lying on your back to sitting on the side of a flat bed without using bedrails?: A Lot Help needed moving to and from a bed to a chair (including a wheelchair)?: A Little Help needed standing up from a chair using your arms (e.g., wheelchair or bedside chair)?: A Lot   Help needed climbing 3-5 steps with a railing? : Total 6 Click Score: 11    End of Session Equipment Utilized During Treatment: Gait belt Activity Tolerance: Patient limited by fatigue;Patient limited by pain Patient left: in bed;with bed alarm set;with call bell/phone within reach Nurse Communication: Mobility status PT Visit Diagnosis: Other abnormalities of gait and mobility (R26.89);Pain Pain - Right/Left: Left Pain - part of body: Arm;Leg     Time: 1610-9604 PT Time Calculation (min) (ACUTE ONLY): 27 min  Charges:  $Therapeutic Activity: 8-22 mins                     [name], PT, DPT Acute Rehabilitation Services Pager [phone] Office [phone]    [name] 06/16/2020, 12:09 PM

## 2020-06-16 NOTE — Progress Notes (Signed)
Occupational Therapy Treatment Patient Details Name: [name] MRN: [id number] DOB: 04-19-1941 Today's Date: 06/16/2020    History of present illness Pt is a 79 y/o female admitted after a mechanical fall at home in which she sustained a L wrist fx and a L greater trochanter fx. Pt is s/p ORIF left distal radius fracture. Ortho decided to manage her L LE fx non-operatively. PMH including but not limited to COPD, CAD and HTN.   OT comments  Pt progressing well. Pt limited by decreased recall of NWB status of LUE with constant cues not to use for transfers. Pt requires ModA overall for self care. Pt standing for toilet hygiene standing at [facility] <2 mins before requiring seated rest break. Pt modA for sitting upright at EOB intermittently. Pt would greatly benefit from continued OT skilled services. OT following acutely.    Follow Up Recommendations  SNF;Supervision/Assistance - 24 hour    Equipment Recommendations  3 in 1 bedside commode;Wheelchair (measurements OT);Wheelchair cushion (measurements OT)    Recommendations for Other Services      Precautions / Restrictions Precautions Precautions: Fall Restrictions Weight Bearing Restrictions: Yes LUE Weight Bearing: Weight bear through elbow only LLE Weight Bearing: Weight bearing as tolerated       Mobility Bed Mobility Overal bed mobility: Needs Assistance Bed Mobility: Sit to Supine       Sit to supine: Mod assist;+2 for physical assistance   General bed mobility comments: assist for trunk and B LEs   Transfers Overall transfer level: Needs assistance Equipment used: 2 person hand held assist Transfers: Sit to/from [employer] Transfers Sit to Stand: Mod assist;+2 safety/equipment;+2 physical assistance Stand pivot transfers: Min assist;+2 physical assistance;+2 safety/equipment       General transfer comment: patient with flexed posture throughout transfer. Patient required minA+2 for stand pivot  recliner>BSC    Balance Overall balance assessment: Needs assistance;History of Falls Sitting-balance support: Feet supported Sitting balance-Leahy Scale: Poor Sitting balance - Comments: modA overall for stability as pt with posterior lean and not always able to right reaction to sit erect. Postural control: Posterior lean Standing balance support: Single extremity supported;During functional activity Standing balance-Leahy Scale: Poor                             ADL either performed or assessed with clinical judgement   ADL Overall ADL's : Needs assistance/impaired Eating/Feeding: Modified independent;Sitting   Grooming: Set up;Sitting                   Toilet Transfer: Moderate assistance;+2 for physical assistance;+2 for safety/equipment;Stand-pivot;BSC Toilet Transfer Details (indicate cue type and reason): HHA due to LUE NWB Toileting- Clothing Manipulation and Hygiene: Moderate assistance;+2 for physical assistance;Sitting/lateral lean;Sit to/from stand Toileting - Clothing Manipulation Details (indicate cue type and reason): able to reach pericare, but requiring assist to clean area due to instability with standing task     Functional mobility during ADLs: Moderate assistance;+2 for physical assistance;+2 for safety/equipment;Cueing for safety;Cueing for sequencing General ADL Comments: Pt limited by decreased raecll of NWB status of LUE with constant cues not to use. Pt requires ModA overall for self care. Pt standing for toilet hygiene <2 mins before requiring seated rest break.     Vision       Perception     Praxis      Cognition Arousal/Alertness: Awake/alert Behavior During Therapy: Restless;Anxious;Impulsive Overall Cognitive Status: Within Functional Limits for tasks assessed  General Comments: Pt with memory deficits require increased assist for following commands and assist for recalling NWB  LUE and to abide by it        Exercises Exercises: General Lower Extremity General Exercises - Lower Extremity Long Arc Quad: Both;10 reps Hip Flexion/Marching: Both;10 reps   Shoulder Instructions       General Comments Pt easily distracted.    Pertinent Vitals/ Pain       Pain Assessment: Faces Faces Pain Scale: Hurts even more Pain Location: L wrist, L LE with movement Pain Descriptors / Indicators: Grimacing;Guarding Pain Intervention(s): Monitored during session;Repositioned  Home Living                                          Prior Functioning/Environment              Frequency  Min 2X/week        Progress Toward Goals  OT Goals(current goals can now be found in the care plan section)  Progress towards OT goals: Progressing toward goals  Acute Rehab OT Goals Patient Stated Goal: to go home OT Goal Formulation: With patient Time For Goal Achievement: 06/27/20 Potential to Achieve Goals: Good ADL Goals Pt Will Perform Grooming: with modified independence;standing Pt Will Perform Lower Body Bathing: with supervision;sit to/from stand Pt Will Perform Lower Body Dressing: with supervision;sit to/from stand Pt Will Transfer to Toilet: with min guard assist;stand pivot transfer;bedside commode  Plan Discharge plan remains appropriate    Co-evaluation    PT/OT/SLP Co-Evaluation/Treatment: Yes Reason for Co-Treatment: For patient/therapist safety;To address functional/ADL transfers PT goals addressed during session: Mobility/safety with mobility;Strengthening/ROM OT goals addressed during session: ADL's and self-care      AM-PAC OT "6 Clicks" Daily Activity     Outcome Measure   Help from another person eating meals?: None Help from another person taking care of personal grooming?: A Little Help from another person toileting, which includes using toliet, bedpan, or urinal?: A Lot Help from another person bathing (including  washing, rinsing, drying)?: A Lot Help from another person to put on and taking off regular upper body clothing?: A Little Help from another person to put on and taking off regular lower body clothing?: A Lot 6 Click Score: 16    End of Session Equipment Utilized During Treatment: Gait belt;Rolling walker  Pain - Right/Left: Left Pain - part of body: Arm   Activity Tolerance Patient limited by pain   Patient Left in bed;with bed alarm set;with call bell/phone within reach   Nurse Communication Mobility status;Weight bearing status        Time: 3212-2482 OT Time Calculation (min): 33 min  Charges: OT General Charges $OT Visit: 1 Visit OT Treatments $Self Care/Home Management : 8-22 mins  Jefferey Pica, OTR/L Acute Rehabilitation Services Pager: [phone] Office: [phone]   Ameia Morency C 06/16/2020, 1:59 PM

## 2020-07-29 DIAGNOSIS — I2510 Atherosclerotic heart disease of native coronary artery without angina pectoris: Secondary | ICD-10-CM | POA: Diagnosis not present

## 2020-07-29 DIAGNOSIS — M545 Low back pain, unspecified: Secondary | ICD-10-CM | POA: Diagnosis not present

## 2020-07-29 DIAGNOSIS — J449 Chronic obstructive pulmonary disease, unspecified: Secondary | ICD-10-CM | POA: Diagnosis not present

## 2020-07-29 DIAGNOSIS — I129 Hypertensive chronic kidney disease with stage 1 through stage 4 chronic kidney disease, or unspecified chronic kidney disease: Secondary | ICD-10-CM | POA: Diagnosis not present

## 2020-07-29 DIAGNOSIS — G629 Polyneuropathy, unspecified: Secondary | ICD-10-CM | POA: Diagnosis not present

## 2020-07-29 DIAGNOSIS — M800AXD Age-related osteoporosis with current pathological fracture, other site, subsequent encounter for fracture with routine healing: Secondary | ICD-10-CM | POA: Diagnosis not present

## 2020-07-29 DIAGNOSIS — Z96653 Presence of artificial knee joint, bilateral: Secondary | ICD-10-CM | POA: Diagnosis not present

## 2020-07-29 DIAGNOSIS — Z7982 Long term (current) use of aspirin: Secondary | ICD-10-CM | POA: Diagnosis not present

## 2020-07-29 DIAGNOSIS — N183 Chronic kidney disease, stage 3 unspecified: Secondary | ICD-10-CM | POA: Diagnosis not present

## 2020-07-29 DIAGNOSIS — M80052D Age-related osteoporosis with current pathological fracture, left femur, subsequent encounter for fracture with routine healing: Secondary | ICD-10-CM | POA: Diagnosis not present

## 2020-07-29 DIAGNOSIS — E559 Vitamin D deficiency, unspecified: Secondary | ICD-10-CM | POA: Diagnosis not present

## 2020-07-29 DIAGNOSIS — Z853 Personal history of malignant neoplasm of breast: Secondary | ICD-10-CM | POA: Diagnosis not present

## 2020-07-29 DIAGNOSIS — Z9181 History of falling: Secondary | ICD-10-CM | POA: Diagnosis not present

## 2020-07-29 DIAGNOSIS — F32A Depression, unspecified: Secondary | ICD-10-CM | POA: Diagnosis not present

## 2020-07-30 DIAGNOSIS — I129 Hypertensive chronic kidney disease with stage 1 through stage 4 chronic kidney disease, or unspecified chronic kidney disease: Secondary | ICD-10-CM | POA: Diagnosis not present

## 2020-07-30 DIAGNOSIS — J449 Chronic obstructive pulmonary disease, unspecified: Secondary | ICD-10-CM | POA: Diagnosis not present

## 2020-07-30 DIAGNOSIS — M545 Low back pain, unspecified: Secondary | ICD-10-CM | POA: Diagnosis not present

## 2020-07-30 DIAGNOSIS — Z96653 Presence of artificial knee joint, bilateral: Secondary | ICD-10-CM | POA: Diagnosis not present

## 2020-07-30 DIAGNOSIS — Z9181 History of falling: Secondary | ICD-10-CM | POA: Diagnosis not present

## 2020-07-30 DIAGNOSIS — G629 Polyneuropathy, unspecified: Secondary | ICD-10-CM | POA: Diagnosis not present

## 2020-07-30 DIAGNOSIS — I2510 Atherosclerotic heart disease of native coronary artery without angina pectoris: Secondary | ICD-10-CM | POA: Diagnosis not present

## 2020-07-30 DIAGNOSIS — Z853 Personal history of malignant neoplasm of breast: Secondary | ICD-10-CM | POA: Diagnosis not present

## 2020-07-30 DIAGNOSIS — M800AXD Age-related osteoporosis with current pathological fracture, other site, subsequent encounter for fracture with routine healing: Secondary | ICD-10-CM | POA: Diagnosis not present

## 2020-07-30 DIAGNOSIS — F32A Depression, unspecified: Secondary | ICD-10-CM | POA: Diagnosis not present

## 2020-07-30 DIAGNOSIS — E559 Vitamin D deficiency, unspecified: Secondary | ICD-10-CM | POA: Diagnosis not present

## 2020-07-30 DIAGNOSIS — N183 Chronic kidney disease, stage 3 unspecified: Secondary | ICD-10-CM | POA: Diagnosis not present

## 2020-07-30 DIAGNOSIS — M80052D Age-related osteoporosis with current pathological fracture, left femur, subsequent encounter for fracture with routine healing: Secondary | ICD-10-CM | POA: Diagnosis not present

## 2020-07-30 DIAGNOSIS — Z7982 Long term (current) use of aspirin: Secondary | ICD-10-CM | POA: Diagnosis not present

## 2020-07-31 DIAGNOSIS — Z96653 Presence of artificial knee joint, bilateral: Secondary | ICD-10-CM | POA: Diagnosis not present

## 2020-07-31 DIAGNOSIS — E559 Vitamin D deficiency, unspecified: Secondary | ICD-10-CM | POA: Diagnosis not present

## 2020-07-31 DIAGNOSIS — J449 Chronic obstructive pulmonary disease, unspecified: Secondary | ICD-10-CM | POA: Diagnosis not present

## 2020-07-31 DIAGNOSIS — Z7982 Long term (current) use of aspirin: Secondary | ICD-10-CM | POA: Diagnosis not present

## 2020-07-31 DIAGNOSIS — I2510 Atherosclerotic heart disease of native coronary artery without angina pectoris: Secondary | ICD-10-CM | POA: Diagnosis not present

## 2020-07-31 DIAGNOSIS — G629 Polyneuropathy, unspecified: Secondary | ICD-10-CM | POA: Diagnosis not present

## 2020-07-31 DIAGNOSIS — M545 Low back pain, unspecified: Secondary | ICD-10-CM | POA: Diagnosis not present

## 2020-07-31 DIAGNOSIS — M800AXD Age-related osteoporosis with current pathological fracture, other site, subsequent encounter for fracture with routine healing: Secondary | ICD-10-CM | POA: Diagnosis not present

## 2020-07-31 DIAGNOSIS — Z9181 History of falling: Secondary | ICD-10-CM | POA: Diagnosis not present

## 2020-07-31 DIAGNOSIS — M80052D Age-related osteoporosis with current pathological fracture, left femur, subsequent encounter for fracture with routine healing: Secondary | ICD-10-CM | POA: Diagnosis not present

## 2020-07-31 DIAGNOSIS — F32A Depression, unspecified: Secondary | ICD-10-CM | POA: Diagnosis not present

## 2020-07-31 DIAGNOSIS — N183 Chronic kidney disease, stage 3 unspecified: Secondary | ICD-10-CM | POA: Diagnosis not present

## 2020-07-31 DIAGNOSIS — I129 Hypertensive chronic kidney disease with stage 1 through stage 4 chronic kidney disease, or unspecified chronic kidney disease: Secondary | ICD-10-CM | POA: Diagnosis not present

## 2020-07-31 DIAGNOSIS — Z853 Personal history of malignant neoplasm of breast: Secondary | ICD-10-CM | POA: Diagnosis not present

## 2020-08-03 DIAGNOSIS — M545 Low back pain, unspecified: Secondary | ICD-10-CM | POA: Diagnosis not present

## 2020-08-03 DIAGNOSIS — M80052D Age-related osteoporosis with current pathological fracture, left femur, subsequent encounter for fracture with routine healing: Secondary | ICD-10-CM | POA: Diagnosis not present

## 2020-08-03 DIAGNOSIS — F32A Depression, unspecified: Secondary | ICD-10-CM | POA: Diagnosis not present

## 2020-08-03 DIAGNOSIS — G629 Polyneuropathy, unspecified: Secondary | ICD-10-CM | POA: Diagnosis not present

## 2020-08-03 DIAGNOSIS — Z853 Personal history of malignant neoplasm of breast: Secondary | ICD-10-CM | POA: Diagnosis not present

## 2020-08-03 DIAGNOSIS — J449 Chronic obstructive pulmonary disease, unspecified: Secondary | ICD-10-CM | POA: Diagnosis not present

## 2020-08-03 DIAGNOSIS — I129 Hypertensive chronic kidney disease with stage 1 through stage 4 chronic kidney disease, or unspecified chronic kidney disease: Secondary | ICD-10-CM | POA: Diagnosis not present

## 2020-08-03 DIAGNOSIS — E559 Vitamin D deficiency, unspecified: Secondary | ICD-10-CM | POA: Diagnosis not present

## 2020-08-03 DIAGNOSIS — Z96653 Presence of artificial knee joint, bilateral: Secondary | ICD-10-CM | POA: Diagnosis not present

## 2020-08-03 DIAGNOSIS — I2510 Atherosclerotic heart disease of native coronary artery without angina pectoris: Secondary | ICD-10-CM | POA: Diagnosis not present

## 2020-08-03 DIAGNOSIS — Z9181 History of falling: Secondary | ICD-10-CM | POA: Diagnosis not present

## 2020-08-03 DIAGNOSIS — Z7982 Long term (current) use of aspirin: Secondary | ICD-10-CM | POA: Diagnosis not present

## 2020-08-03 DIAGNOSIS — M800AXD Age-related osteoporosis with current pathological fracture, other site, subsequent encounter for fracture with routine healing: Secondary | ICD-10-CM | POA: Diagnosis not present

## 2020-08-03 DIAGNOSIS — N183 Chronic kidney disease, stage 3 unspecified: Secondary | ICD-10-CM | POA: Diagnosis not present

## 2020-08-05 DIAGNOSIS — Z7982 Long term (current) use of aspirin: Secondary | ICD-10-CM | POA: Diagnosis not present

## 2020-08-05 DIAGNOSIS — I129 Hypertensive chronic kidney disease with stage 1 through stage 4 chronic kidney disease, or unspecified chronic kidney disease: Secondary | ICD-10-CM | POA: Diagnosis not present

## 2020-08-05 DIAGNOSIS — J449 Chronic obstructive pulmonary disease, unspecified: Secondary | ICD-10-CM | POA: Diagnosis not present

## 2020-08-05 DIAGNOSIS — Z9181 History of falling: Secondary | ICD-10-CM | POA: Diagnosis not present

## 2020-08-05 DIAGNOSIS — M545 Low back pain, unspecified: Secondary | ICD-10-CM | POA: Diagnosis not present

## 2020-08-05 DIAGNOSIS — N183 Chronic kidney disease, stage 3 unspecified: Secondary | ICD-10-CM | POA: Diagnosis not present

## 2020-08-05 DIAGNOSIS — Z853 Personal history of malignant neoplasm of breast: Secondary | ICD-10-CM | POA: Diagnosis not present

## 2020-08-05 DIAGNOSIS — I2510 Atherosclerotic heart disease of native coronary artery without angina pectoris: Secondary | ICD-10-CM | POA: Diagnosis not present

## 2020-08-05 DIAGNOSIS — Z96653 Presence of artificial knee joint, bilateral: Secondary | ICD-10-CM | POA: Diagnosis not present

## 2020-08-05 DIAGNOSIS — E559 Vitamin D deficiency, unspecified: Secondary | ICD-10-CM | POA: Diagnosis not present

## 2020-08-05 DIAGNOSIS — M800AXD Age-related osteoporosis with current pathological fracture, other site, subsequent encounter for fracture with routine healing: Secondary | ICD-10-CM | POA: Diagnosis not present

## 2020-08-05 DIAGNOSIS — G629 Polyneuropathy, unspecified: Secondary | ICD-10-CM | POA: Diagnosis not present

## 2020-08-05 DIAGNOSIS — M80052D Age-related osteoporosis with current pathological fracture, left femur, subsequent encounter for fracture with routine healing: Secondary | ICD-10-CM | POA: Diagnosis not present

## 2020-08-05 DIAGNOSIS — F32A Depression, unspecified: Secondary | ICD-10-CM | POA: Diagnosis not present

## 2020-08-06 DIAGNOSIS — M800AXD Age-related osteoporosis with current pathological fracture, other site, subsequent encounter for fracture with routine healing: Secondary | ICD-10-CM | POA: Diagnosis not present

## 2020-08-06 DIAGNOSIS — E559 Vitamin D deficiency, unspecified: Secondary | ICD-10-CM | POA: Diagnosis not present

## 2020-08-06 DIAGNOSIS — M545 Low back pain, unspecified: Secondary | ICD-10-CM | POA: Diagnosis not present

## 2020-08-06 DIAGNOSIS — I2510 Atherosclerotic heart disease of native coronary artery without angina pectoris: Secondary | ICD-10-CM | POA: Diagnosis not present

## 2020-08-06 DIAGNOSIS — N183 Chronic kidney disease, stage 3 unspecified: Secondary | ICD-10-CM | POA: Diagnosis not present

## 2020-08-06 DIAGNOSIS — I129 Hypertensive chronic kidney disease with stage 1 through stage 4 chronic kidney disease, or unspecified chronic kidney disease: Secondary | ICD-10-CM | POA: Diagnosis not present

## 2020-08-06 DIAGNOSIS — J449 Chronic obstructive pulmonary disease, unspecified: Secondary | ICD-10-CM | POA: Diagnosis not present

## 2020-08-06 DIAGNOSIS — Z9181 History of falling: Secondary | ICD-10-CM | POA: Diagnosis not present

## 2020-08-06 DIAGNOSIS — F32A Depression, unspecified: Secondary | ICD-10-CM | POA: Diagnosis not present

## 2020-08-06 DIAGNOSIS — G629 Polyneuropathy, unspecified: Secondary | ICD-10-CM | POA: Diagnosis not present

## 2020-08-06 DIAGNOSIS — M80052D Age-related osteoporosis with current pathological fracture, left femur, subsequent encounter for fracture with routine healing: Secondary | ICD-10-CM | POA: Diagnosis not present

## 2020-08-06 DIAGNOSIS — Z96653 Presence of artificial knee joint, bilateral: Secondary | ICD-10-CM | POA: Diagnosis not present

## 2020-08-06 DIAGNOSIS — Z853 Personal history of malignant neoplasm of breast: Secondary | ICD-10-CM | POA: Diagnosis not present

## 2020-08-06 DIAGNOSIS — Z7982 Long term (current) use of aspirin: Secondary | ICD-10-CM | POA: Diagnosis not present

## 2020-08-12 DIAGNOSIS — F32A Depression, unspecified: Secondary | ICD-10-CM | POA: Diagnosis not present

## 2020-08-12 DIAGNOSIS — E559 Vitamin D deficiency, unspecified: Secondary | ICD-10-CM | POA: Diagnosis not present

## 2020-08-12 DIAGNOSIS — M800AXD Age-related osteoporosis with current pathological fracture, other site, subsequent encounter for fracture with routine healing: Secondary | ICD-10-CM | POA: Diagnosis not present

## 2020-08-12 DIAGNOSIS — Z96653 Presence of artificial knee joint, bilateral: Secondary | ICD-10-CM | POA: Diagnosis not present

## 2020-08-12 DIAGNOSIS — J449 Chronic obstructive pulmonary disease, unspecified: Secondary | ICD-10-CM | POA: Diagnosis not present

## 2020-08-12 DIAGNOSIS — I2510 Atherosclerotic heart disease of native coronary artery without angina pectoris: Secondary | ICD-10-CM | POA: Diagnosis not present

## 2020-08-12 DIAGNOSIS — Z7982 Long term (current) use of aspirin: Secondary | ICD-10-CM | POA: Diagnosis not present

## 2020-08-12 DIAGNOSIS — G629 Polyneuropathy, unspecified: Secondary | ICD-10-CM | POA: Diagnosis not present

## 2020-08-12 DIAGNOSIS — M80052D Age-related osteoporosis with current pathological fracture, left femur, subsequent encounter for fracture with routine healing: Secondary | ICD-10-CM | POA: Diagnosis not present

## 2020-08-12 DIAGNOSIS — Z853 Personal history of malignant neoplasm of breast: Secondary | ICD-10-CM | POA: Diagnosis not present

## 2020-08-12 DIAGNOSIS — Z9181 History of falling: Secondary | ICD-10-CM | POA: Diagnosis not present

## 2020-08-12 DIAGNOSIS — M545 Low back pain, unspecified: Secondary | ICD-10-CM | POA: Diagnosis not present

## 2020-08-12 DIAGNOSIS — N183 Chronic kidney disease, stage 3 unspecified: Secondary | ICD-10-CM | POA: Diagnosis not present

## 2020-08-12 DIAGNOSIS — I129 Hypertensive chronic kidney disease with stage 1 through stage 4 chronic kidney disease, or unspecified chronic kidney disease: Secondary | ICD-10-CM | POA: Diagnosis not present

## 2020-08-13 DIAGNOSIS — Z96653 Presence of artificial knee joint, bilateral: Secondary | ICD-10-CM | POA: Diagnosis not present

## 2020-08-13 DIAGNOSIS — I2510 Atherosclerotic heart disease of native coronary artery without angina pectoris: Secondary | ICD-10-CM | POA: Diagnosis not present

## 2020-08-13 DIAGNOSIS — Z9181 History of falling: Secondary | ICD-10-CM | POA: Diagnosis not present

## 2020-08-13 DIAGNOSIS — I129 Hypertensive chronic kidney disease with stage 1 through stage 4 chronic kidney disease, or unspecified chronic kidney disease: Secondary | ICD-10-CM | POA: Diagnosis not present

## 2020-08-13 DIAGNOSIS — G629 Polyneuropathy, unspecified: Secondary | ICD-10-CM | POA: Diagnosis not present

## 2020-08-13 DIAGNOSIS — Z853 Personal history of malignant neoplasm of breast: Secondary | ICD-10-CM | POA: Diagnosis not present

## 2020-08-13 DIAGNOSIS — J449 Chronic obstructive pulmonary disease, unspecified: Secondary | ICD-10-CM | POA: Diagnosis not present

## 2020-08-13 DIAGNOSIS — E559 Vitamin D deficiency, unspecified: Secondary | ICD-10-CM | POA: Diagnosis not present

## 2020-08-13 DIAGNOSIS — M80052D Age-related osteoporosis with current pathological fracture, left femur, subsequent encounter for fracture with routine healing: Secondary | ICD-10-CM | POA: Diagnosis not present

## 2020-08-13 DIAGNOSIS — M800AXD Age-related osteoporosis with current pathological fracture, other site, subsequent encounter for fracture with routine healing: Secondary | ICD-10-CM | POA: Diagnosis not present

## 2020-08-13 DIAGNOSIS — N183 Chronic kidney disease, stage 3 unspecified: Secondary | ICD-10-CM | POA: Diagnosis not present

## 2020-08-13 DIAGNOSIS — Z7982 Long term (current) use of aspirin: Secondary | ICD-10-CM | POA: Diagnosis not present

## 2020-08-13 DIAGNOSIS — F32A Depression, unspecified: Secondary | ICD-10-CM | POA: Diagnosis not present

## 2020-08-13 DIAGNOSIS — M545 Low back pain, unspecified: Secondary | ICD-10-CM | POA: Diagnosis not present

## 2020-08-14 DIAGNOSIS — Z853 Personal history of malignant neoplasm of breast: Secondary | ICD-10-CM | POA: Diagnosis not present

## 2020-08-14 DIAGNOSIS — M545 Low back pain, unspecified: Secondary | ICD-10-CM | POA: Diagnosis not present

## 2020-08-14 DIAGNOSIS — J449 Chronic obstructive pulmonary disease, unspecified: Secondary | ICD-10-CM | POA: Diagnosis not present

## 2020-08-14 DIAGNOSIS — G629 Polyneuropathy, unspecified: Secondary | ICD-10-CM | POA: Diagnosis not present

## 2020-08-14 DIAGNOSIS — F32A Depression, unspecified: Secondary | ICD-10-CM | POA: Diagnosis not present

## 2020-08-14 DIAGNOSIS — Z9181 History of falling: Secondary | ICD-10-CM | POA: Diagnosis not present

## 2020-08-14 DIAGNOSIS — Z7982 Long term (current) use of aspirin: Secondary | ICD-10-CM | POA: Diagnosis not present

## 2020-08-14 DIAGNOSIS — I2510 Atherosclerotic heart disease of native coronary artery without angina pectoris: Secondary | ICD-10-CM | POA: Diagnosis not present

## 2020-08-14 DIAGNOSIS — E559 Vitamin D deficiency, unspecified: Secondary | ICD-10-CM | POA: Diagnosis not present

## 2020-08-14 DIAGNOSIS — N183 Chronic kidney disease, stage 3 unspecified: Secondary | ICD-10-CM | POA: Diagnosis not present

## 2020-08-14 DIAGNOSIS — I129 Hypertensive chronic kidney disease with stage 1 through stage 4 chronic kidney disease, or unspecified chronic kidney disease: Secondary | ICD-10-CM | POA: Diagnosis not present

## 2020-08-14 DIAGNOSIS — Z96653 Presence of artificial knee joint, bilateral: Secondary | ICD-10-CM | POA: Diagnosis not present

## 2020-08-14 DIAGNOSIS — M800AXD Age-related osteoporosis with current pathological fracture, other site, subsequent encounter for fracture with routine healing: Secondary | ICD-10-CM | POA: Diagnosis not present

## 2020-08-14 DIAGNOSIS — M80052D Age-related osteoporosis with current pathological fracture, left femur, subsequent encounter for fracture with routine healing: Secondary | ICD-10-CM | POA: Diagnosis not present

## 2020-08-20 DIAGNOSIS — G8929 Other chronic pain: Secondary | ICD-10-CM | POA: Diagnosis not present

## 2020-08-20 DIAGNOSIS — K219 Gastro-esophageal reflux disease without esophagitis: Secondary | ICD-10-CM | POA: Diagnosis not present

## 2020-08-20 DIAGNOSIS — E782 Mixed hyperlipidemia: Secondary | ICD-10-CM | POA: Diagnosis not present

## 2020-08-20 DIAGNOSIS — I10 Essential (primary) hypertension: Secondary | ICD-10-CM | POA: Diagnosis not present

## 2020-08-20 DIAGNOSIS — J441 Chronic obstructive pulmonary disease with (acute) exacerbation: Secondary | ICD-10-CM | POA: Diagnosis not present

## 2020-08-20 DIAGNOSIS — I5020 Unspecified systolic (congestive) heart failure: Secondary | ICD-10-CM | POA: Diagnosis not present

## 2020-08-20 DIAGNOSIS — N183 Chronic kidney disease, stage 3 unspecified: Secondary | ICD-10-CM | POA: Diagnosis not present

## 2020-08-20 DIAGNOSIS — M810 Age-related osteoporosis without current pathological fracture: Secondary | ICD-10-CM | POA: Diagnosis not present

## 2020-08-20 DIAGNOSIS — J449 Chronic obstructive pulmonary disease, unspecified: Secondary | ICD-10-CM | POA: Diagnosis not present

## 2020-09-21 DIAGNOSIS — E782 Mixed hyperlipidemia: Secondary | ICD-10-CM | POA: Diagnosis not present

## 2020-09-21 DIAGNOSIS — K219 Gastro-esophageal reflux disease without esophagitis: Secondary | ICD-10-CM | POA: Diagnosis not present

## 2020-09-21 DIAGNOSIS — N183 Chronic kidney disease, stage 3 unspecified: Secondary | ICD-10-CM | POA: Diagnosis not present

## 2020-09-21 DIAGNOSIS — J449 Chronic obstructive pulmonary disease, unspecified: Secondary | ICD-10-CM | POA: Diagnosis not present

## 2020-09-21 DIAGNOSIS — G8929 Other chronic pain: Secondary | ICD-10-CM | POA: Diagnosis not present

## 2020-09-21 DIAGNOSIS — I5020 Unspecified systolic (congestive) heart failure: Secondary | ICD-10-CM | POA: Diagnosis not present

## 2020-09-21 DIAGNOSIS — I10 Essential (primary) hypertension: Secondary | ICD-10-CM | POA: Diagnosis not present

## 2020-09-21 DIAGNOSIS — M1990 Unspecified osteoarthritis, unspecified site: Secondary | ICD-10-CM | POA: Diagnosis not present

## 2020-09-21 DIAGNOSIS — M810 Age-related osteoporosis without current pathological fracture: Secondary | ICD-10-CM | POA: Diagnosis not present

## 2020-10-12 DIAGNOSIS — D501 Sideropenic dysphagia: Secondary | ICD-10-CM | POA: Diagnosis not present

## 2020-10-12 DIAGNOSIS — J449 Chronic obstructive pulmonary disease, unspecified: Secondary | ICD-10-CM | POA: Diagnosis not present

## 2020-10-12 DIAGNOSIS — I5020 Unspecified systolic (congestive) heart failure: Secondary | ICD-10-CM | POA: Diagnosis not present

## 2020-10-12 DIAGNOSIS — G8929 Other chronic pain: Secondary | ICD-10-CM | POA: Diagnosis not present

## 2020-10-12 DIAGNOSIS — M810 Age-related osteoporosis without current pathological fracture: Secondary | ICD-10-CM | POA: Diagnosis not present

## 2020-10-12 DIAGNOSIS — N183 Chronic kidney disease, stage 3 unspecified: Secondary | ICD-10-CM | POA: Diagnosis not present

## 2020-10-12 DIAGNOSIS — E782 Mixed hyperlipidemia: Secondary | ICD-10-CM | POA: Diagnosis not present

## 2020-10-12 DIAGNOSIS — J441 Chronic obstructive pulmonary disease with (acute) exacerbation: Secondary | ICD-10-CM | POA: Diagnosis not present

## 2020-10-12 DIAGNOSIS — K219 Gastro-esophageal reflux disease without esophagitis: Secondary | ICD-10-CM | POA: Diagnosis not present

## 2020-10-12 DIAGNOSIS — I10 Essential (primary) hypertension: Secondary | ICD-10-CM | POA: Diagnosis not present

## 2020-10-28 ENCOUNTER — Emergency Department (HOSPITAL_COMMUNITY)
Admission: EM | Admit: 2020-10-28 | Discharge: 2020-10-29 | Disposition: A | Discharge status: Home / Self Care | Payer: Medicare Other | Attending: Emergency Medicine | Admitting: Emergency Medicine

## 2020-10-28 ENCOUNTER — Other Ambulatory Visit: Payer: Self-pay

## 2020-10-28 ENCOUNTER — Emergency Department (HOSPITAL_COMMUNITY): Payer: Medicare Other

## 2020-10-28 DIAGNOSIS — Z881 Allergy status to other antibiotic agents status: Secondary | ICD-10-CM | POA: Insufficient documentation

## 2020-10-28 DIAGNOSIS — Z951 Presence of aortocoronary bypass graft: Secondary | ICD-10-CM | POA: Diagnosis not present

## 2020-10-28 DIAGNOSIS — R519 Headache, unspecified: Secondary | ICD-10-CM | POA: Insufficient documentation

## 2020-10-28 DIAGNOSIS — Z8249 Family history of ischemic heart disease and other diseases of the circulatory system: Secondary | ICD-10-CM | POA: Diagnosis not present

## 2020-10-28 DIAGNOSIS — R531 Weakness: Secondary | ICD-10-CM | POA: Diagnosis not present

## 2020-10-28 DIAGNOSIS — H53129 Transient visual loss, unspecified eye: Secondary | ICD-10-CM | POA: Insufficient documentation

## 2020-10-28 DIAGNOSIS — Z96653 Presence of artificial knee joint, bilateral: Secondary | ICD-10-CM | POA: Insufficient documentation

## 2020-10-28 DIAGNOSIS — I6501 Occlusion and stenosis of right vertebral artery: Secondary | ICD-10-CM | POA: Diagnosis not present

## 2020-10-28 DIAGNOSIS — M542 Cervicalgia: Secondary | ICD-10-CM | POA: Insufficient documentation

## 2020-10-28 DIAGNOSIS — R7989 Other specified abnormal findings of blood chemistry: Secondary | ICD-10-CM | POA: Insufficient documentation

## 2020-10-28 DIAGNOSIS — R4702 Dysphasia: Secondary | ICD-10-CM | POA: Diagnosis not present

## 2020-10-28 DIAGNOSIS — N183 Chronic kidney disease, stage 3 unspecified: Secondary | ICD-10-CM | POA: Diagnosis not present

## 2020-10-28 DIAGNOSIS — I1310 Hypertensive heart and chronic kidney disease without heart failure, with stage 1 through stage 4 chronic kidney disease, or unspecified chronic kidney disease: Secondary | ICD-10-CM | POA: Diagnosis not present

## 2020-10-28 DIAGNOSIS — I639 Cerebral infarction, unspecified: Secondary | ICD-10-CM | POA: Diagnosis not present

## 2020-10-28 DIAGNOSIS — J45909 Unspecified asthma, uncomplicated: Secondary | ICD-10-CM | POA: Diagnosis not present

## 2020-10-28 DIAGNOSIS — I6782 Cerebral ischemia: Secondary | ICD-10-CM | POA: Insufficient documentation

## 2020-10-28 DIAGNOSIS — Z8542 Personal history of malignant neoplasm of other parts of uterus: Secondary | ICD-10-CM | POA: Insufficient documentation

## 2020-10-28 DIAGNOSIS — G4489 Other headache syndrome: Secondary | ICD-10-CM | POA: Diagnosis not present

## 2020-10-28 DIAGNOSIS — Z87891 Personal history of nicotine dependence: Secondary | ICD-10-CM | POA: Insufficient documentation

## 2020-10-28 DIAGNOSIS — J449 Chronic obstructive pulmonary disease, unspecified: Secondary | ICD-10-CM | POA: Insufficient documentation

## 2020-10-28 DIAGNOSIS — I2510 Atherosclerotic heart disease of native coronary artery without angina pectoris: Secondary | ICD-10-CM | POA: Insufficient documentation

## 2020-10-28 DIAGNOSIS — R471 Dysarthria and anarthria: Secondary | ICD-10-CM | POA: Diagnosis not present

## 2020-10-28 DIAGNOSIS — N3000 Acute cystitis without hematuria: Secondary | ICD-10-CM | POA: Diagnosis not present

## 2020-10-28 DIAGNOSIS — R29810 Facial weakness: Secondary | ICD-10-CM | POA: Diagnosis not present

## 2020-10-28 DIAGNOSIS — R0902 Hypoxemia: Secondary | ICD-10-CM | POA: Diagnosis not present

## 2020-10-28 DIAGNOSIS — G459 Transient cerebral ischemic attack, unspecified: Secondary | ICD-10-CM | POA: Diagnosis not present

## 2020-10-28 DIAGNOSIS — Z7982 Long term (current) use of aspirin: Secondary | ICD-10-CM | POA: Insufficient documentation

## 2020-10-28 DIAGNOSIS — Z79899 Other long term (current) drug therapy: Secondary | ICD-10-CM | POA: Insufficient documentation

## 2020-10-28 DIAGNOSIS — R4781 Slurred speech: Secondary | ICD-10-CM | POA: Diagnosis not present

## 2020-10-28 DIAGNOSIS — R42 Dizziness and giddiness: Secondary | ICD-10-CM | POA: Insufficient documentation

## 2020-10-28 DIAGNOSIS — N179 Acute kidney failure, unspecified: Secondary | ICD-10-CM | POA: Diagnosis not present

## 2020-10-28 DIAGNOSIS — H539 Unspecified visual disturbance: Secondary | ICD-10-CM

## 2020-10-28 LAB — COMPREHENSIVE METABOLIC PANEL
ALT: 11 U/L (ref 0–44)
AST: 21 U/L (ref 15–41)
Albumin: 3.3 g/dL — ABNORMAL LOW (ref 3.5–5.0)
Alkaline Phosphatase: 63 U/L (ref 38–126)
Anion gap: 7 (ref 5–15)
BUN: 16 mg/dL (ref 8–23)
CO2: 26 mmol/L (ref 22–32)
Calcium: 8.9 mg/dL (ref 8.9–10.3)
Chloride: 104 mmol/L (ref 98–111)
Creatinine, Ser: 1.87 mg/dL — ABNORMAL HIGH (ref 0.44–1.00)
GFR, Estimated: 27 mL/min — ABNORMAL LOW (ref >60)
Glucose, Bld: 101 mg/dL — ABNORMAL HIGH (ref 70–99)
Potassium: 4.6 mmol/L (ref 3.5–5.1)
Sodium: 137 mmol/L (ref 135–145)
Total Bilirubin: 0.4 mg/dL (ref 0.3–1.2)
Total Protein: 7.4 g/dL (ref 6.5–8.1)

## 2020-10-28 LAB — CBC WITH DIFFERENTIAL/PLATELET
Abs Immature Granulocytes: 0.02 10*3/uL (ref 0.00–0.07)
Basophils Absolute: 0.1 10*3/uL (ref 0.0–0.1)
Basophils Relative: 1 %
Eosinophils Absolute: 0.3 10*3/uL (ref 0.0–0.5)
Eosinophils Relative: 3 %
HCT: 38.1 % (ref 36.0–46.0)
Hemoglobin: 11.6 g/dL — ABNORMAL LOW (ref 12.0–15.0)
Immature Granulocytes: 0 %
Lymphocytes Relative: 26 %
Lymphs Abs: 2 10*3/uL (ref 0.7–4.0)
MCH: 27 pg (ref 26.0–34.0)
MCHC: 30.4 g/dL (ref 30.0–36.0)
MCV: 88.8 fL (ref 80.0–100.0)
Monocytes Absolute: 0.7 10*3/uL (ref 0.1–1.0)
Monocytes Relative: 8 %
Neutro Abs: 4.9 10*3/uL (ref 1.7–7.7)
Neutrophils Relative %: 62 %
Platelets: 361 10*3/uL (ref 150–400)
RBC: 4.29 MIL/uL (ref 3.87–5.11)
RDW: 16.1 % — ABNORMAL HIGH (ref 11.5–15.5)
WBC: 8 10*3/uL (ref 4.0–10.5)
nRBC: 0 % (ref 0.0–0.2)

## 2020-10-28 LAB — PROTIME-INR
INR: 1 (ref 0.8–1.2)
Prothrombin Time: 12.7 seconds (ref 11.4–15.2)

## 2020-10-28 LAB — APTT: aPTT: 27 seconds (ref 24–36)

## 2020-10-28 IMAGING — CT CT HEAD W/O CM
4 series · 17 of 47 positions shown, 19 images · non-contrast
Comparison: [DATE]

CLINICAL DATA: Acute neurological deficit.  Dizziness.

EXAM:
CT HEAD WITHOUT CONTRAST
TECHNIQUE: Contiguous axial images were obtained from the base of the skull
through the vertex without intravenous contrast.

[Series 3: head without · axial · non-contrast · 0.44mm/px · z∈[-216,-101]mm · 7 of 31 slices shown, 9 images]
[im 4/31  brain]
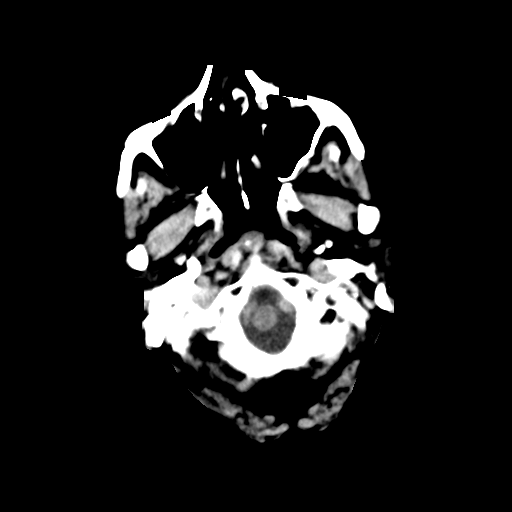
[im 4/31  bone]
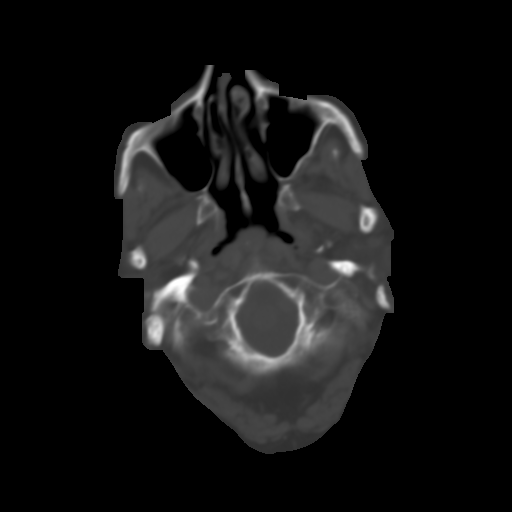
[im 8/31  brain]
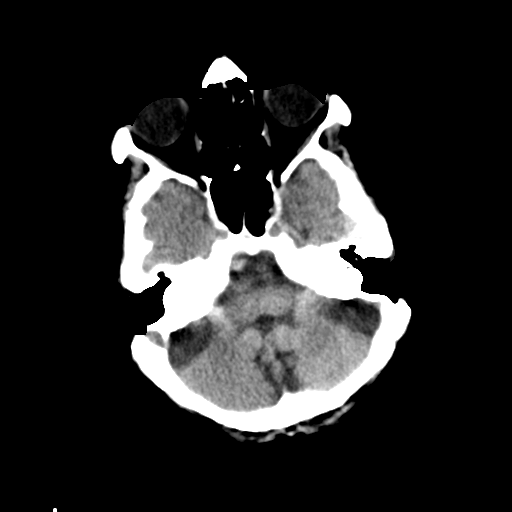
[im 12/31  brain]
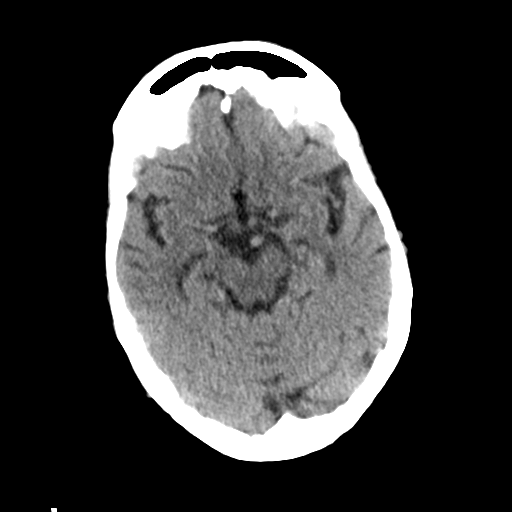
[im 16/31  brain]
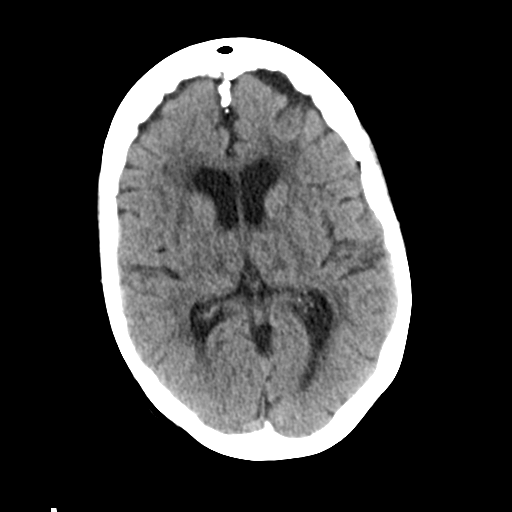
[im 19/31  brain]
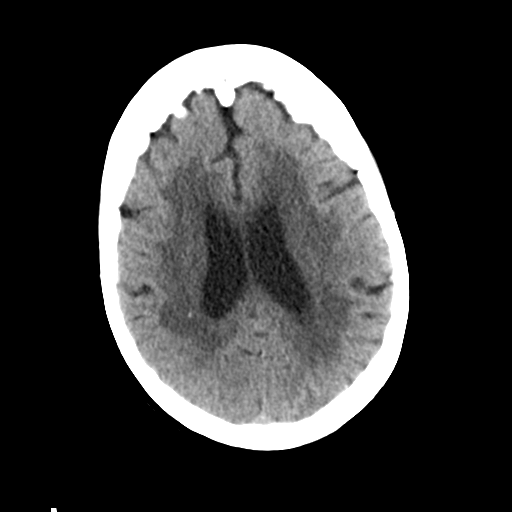
[im 19/31  bone]
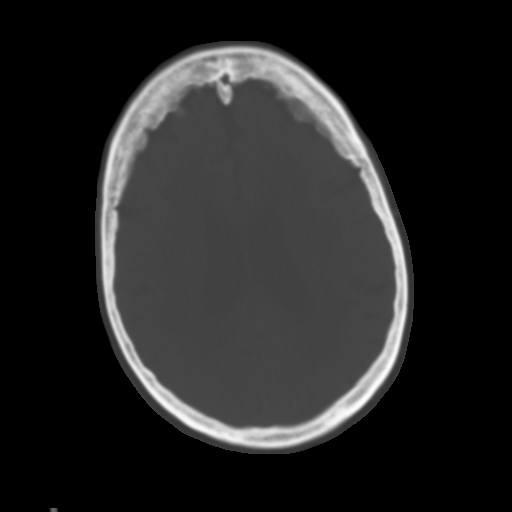
[im 23/31  brain]
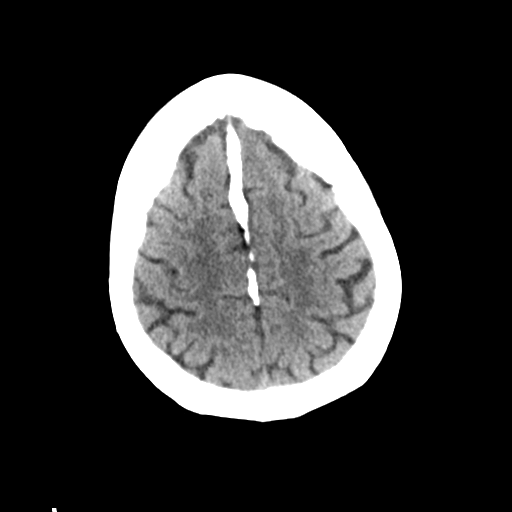
[im 27/31  brain]
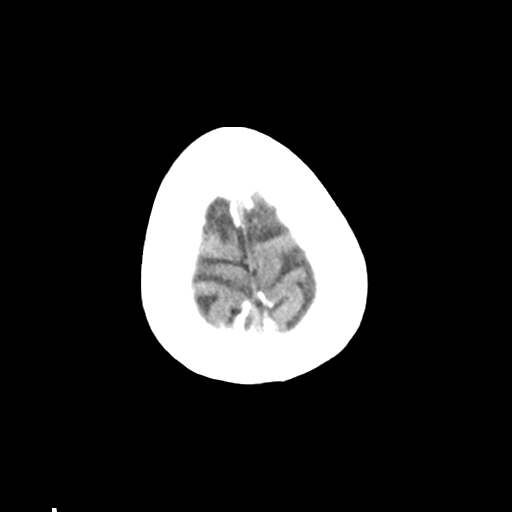

[Series 4: head bone · axial · 0.44mm/px · z∈[-217,-163]mm · 4 of 78 slices shown]
[im 8/78  bone]
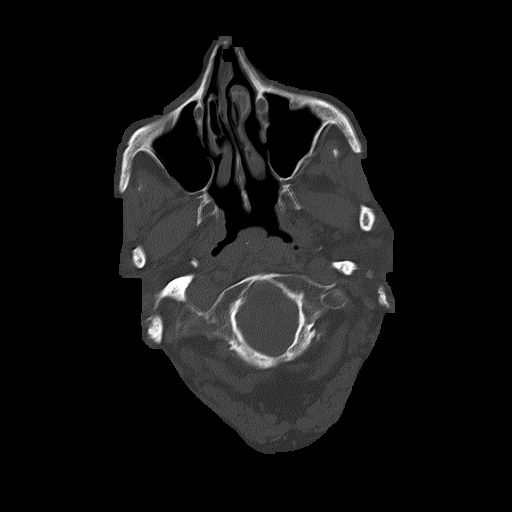
[im 16/78  bone]
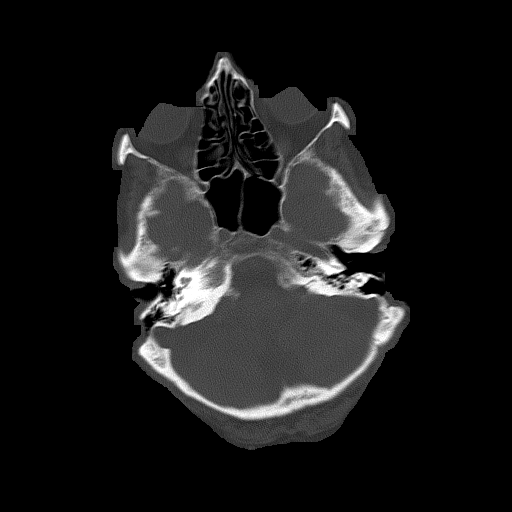
[im 24/78  bone]
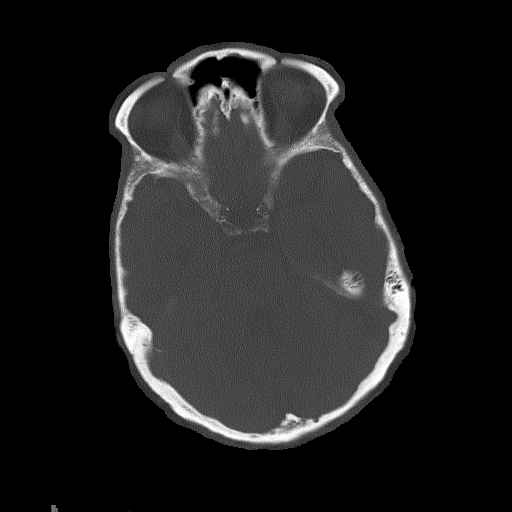
[im 35/78  bone]
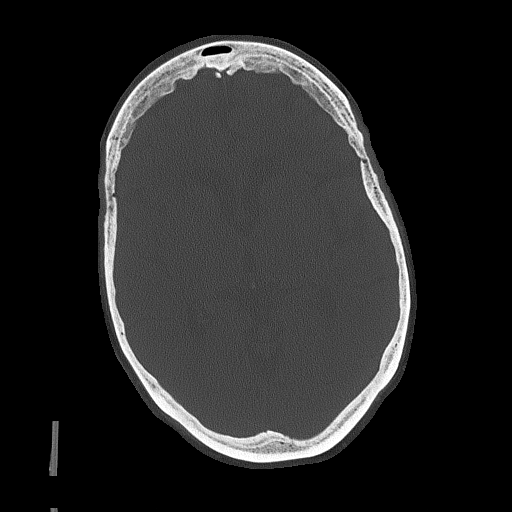

[Series 5: head without cor · coronal · non-contrast · 0.33mm/px · 3 of 71 slices shown]
[im 24/71  brain]
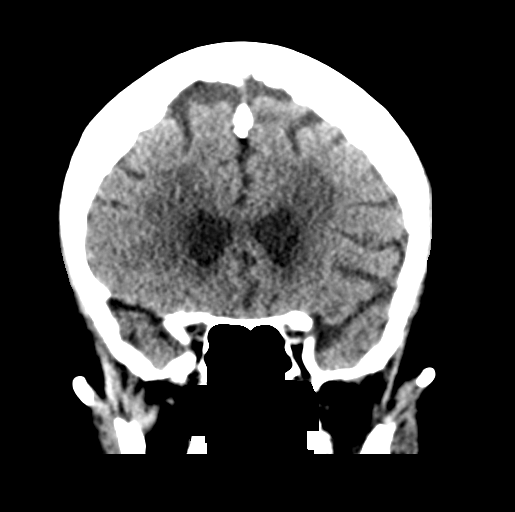
[im 32/71  brain]
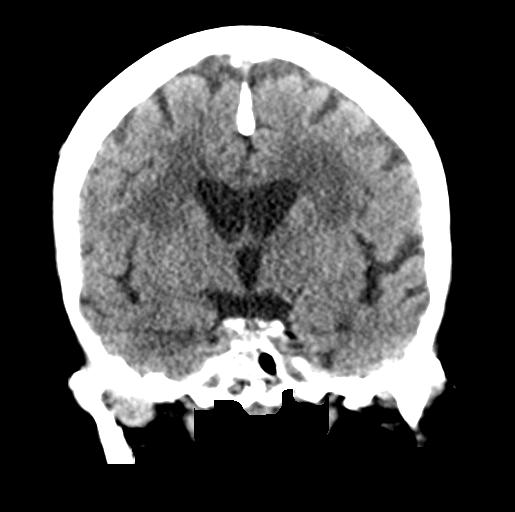
[im 39/71  brain]
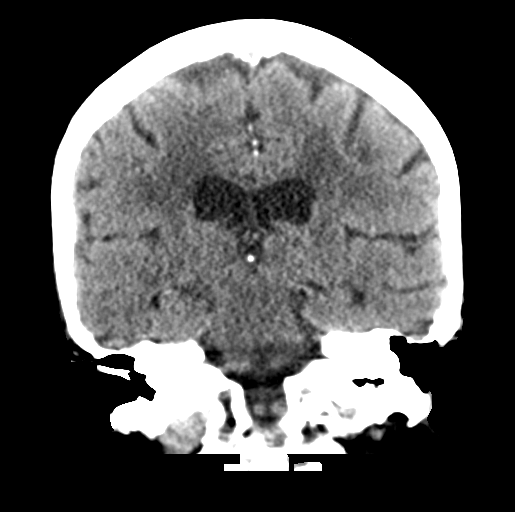

[Series 6: head without sag · sagittal · non-contrast · 0.36mm/px · 3 of 50 slices shown]
[im 17/50  brain]
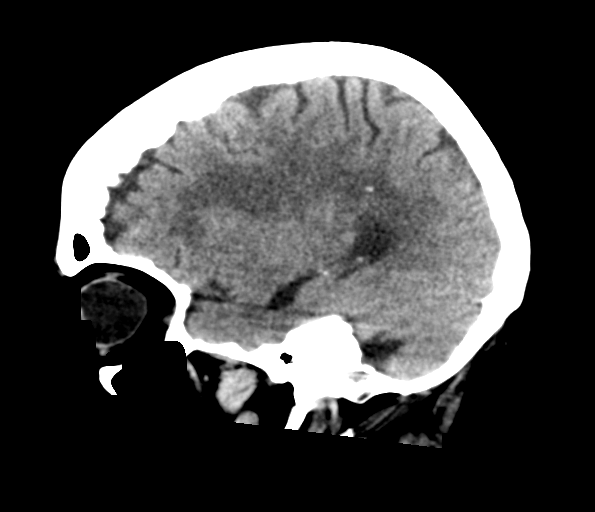
[im 25/50  brain]
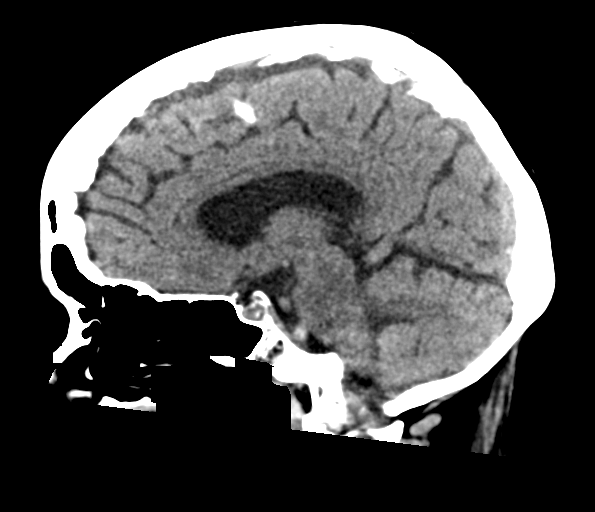
[im 33/50  brain]
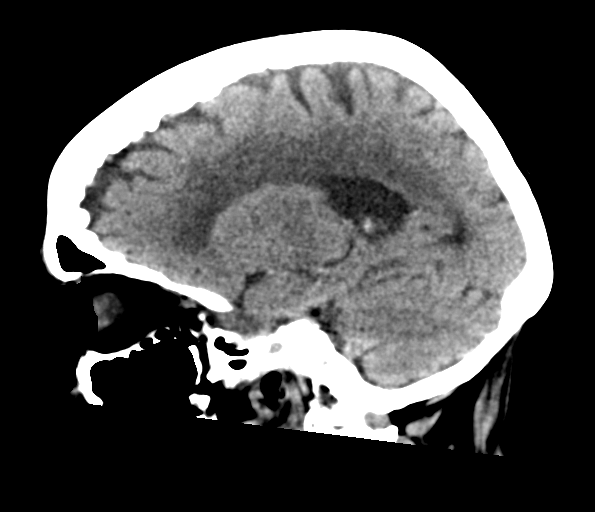

[17 of 47 positions shown; findings below may reference images not displayed]

FINDINGS: Brain: No evidence of acute infarction, hemorrhage, hydrocephalus,
extra-axial collection or mass lesion/mass effect. Diffuse cerebral
atrophy. Ventricular dilatation likely due to central atrophy.
Low-attenuation changes in the deep white matter consistent small
vessel ischemia.

Vascular: Moderate intracranial arterial vascular calcifications.

Skull: Calvarium appears intact.

Sinuses/Orbits: Paranasal sinuses and mastoid air cells are clear.
Loss of mastoid septations likely representing previous surgery
bilaterally.

Other: No change since prior study.
IMPRESSION: 1. No acute intracranial abnormalities.
2. Chronic atrophy and small vessel ischemia.

## 2020-10-28 MED ORDER — SODIUM CHLORIDE 0.9% FLUSH
3.0000 mL | Freq: Once | INTRAVENOUS | Status: AC
Start: 2020-10-28 — End: 2020-10-29
  Administered 2020-10-29: 3 mL via INTRAVENOUS

## 2020-10-28 NOTE — ED Notes (Signed)
[name], cousin, Collin, son, [phone] Numbers for pt

## 2020-10-28 NOTE — ED Triage Notes (Signed)
Ems said son said some slurred speech per son little weaker on left more than right. Some pain in the back of her head. 3 to 4 hrs ago. EMS 1800 no deficits. No slurred speech.

## 2020-10-28 NOTE — ED Provider Notes (Signed)
Patient placed in Quick Look pathway, seen and evaluated   Chief Complaint: weakness  HPI:  Weak since 2pm today.  EMS reports family reported pt is back to normal  ROS: no fever no cough  Physical Exam:   Gen: No distress  Neuro: Awake and Alert  Skin: Warm    Focused Exam:no facial weakness, from all ext   Initiation of care has begun. The patient has been counseled on the process, plan, and necessity for staying for the completion/evaluation, and the remainder of the medical screening examination   Sidney Ace 10/28/20 1904    Blanchie Dessert, MD 10/28/20 2303

## 2020-10-29 ENCOUNTER — Emergency Department (HOSPITAL_COMMUNITY): Payer: Medicare Other

## 2020-10-29 DIAGNOSIS — R519 Headache, unspecified: Secondary | ICD-10-CM | POA: Diagnosis not present

## 2020-10-29 DIAGNOSIS — I639 Cerebral infarction, unspecified: Secondary | ICD-10-CM | POA: Diagnosis not present

## 2020-10-29 DIAGNOSIS — R4702 Dysphasia: Secondary | ICD-10-CM | POA: Diagnosis not present

## 2020-10-29 DIAGNOSIS — I6501 Occlusion and stenosis of right vertebral artery: Secondary | ICD-10-CM | POA: Diagnosis not present

## 2020-10-29 DIAGNOSIS — R42 Dizziness and giddiness: Secondary | ICD-10-CM | POA: Diagnosis not present

## 2020-10-29 LAB — BASIC METABOLIC PANEL
Anion gap: 4 — ABNORMAL LOW (ref 5–15)
Anion gap: 5 (ref 5–15)
BUN: 17 mg/dL (ref 8–23)
BUN: 16 mg/dL (ref 8–23)
CO2: 27 mmol/L (ref 22–32)
CO2: 25 mmol/L (ref 22–32)
Calcium: 8.1 mg/dL — ABNORMAL LOW (ref 8.9–10.3)
Calcium: 8 mg/dL — ABNORMAL LOW (ref 8.9–10.3)
Chloride: 108 mmol/L (ref 98–111)
Chloride: 108 mmol/L (ref 98–111)
Creatinine, Ser: 1.57 mg/dL — ABNORMAL HIGH (ref 0.44–1.00)
Creatinine, Ser: 1.37 mg/dL — ABNORMAL HIGH (ref 0.44–1.00)
GFR, Estimated: 34 mL/min — ABNORMAL LOW (ref >60)
GFR, Estimated: 40 mL/min — ABNORMAL LOW (ref >60)
Glucose, Bld: 92 mg/dL (ref 70–99)
Glucose, Bld: 82 mg/dL (ref 70–99)
Potassium: 4.9 mmol/L (ref 3.5–5.1)
Potassium: 4.4 mmol/L (ref 3.5–5.1)
Sodium: 139 mmol/L (ref 135–145)
Sodium: 138 mmol/L (ref 135–145)

## 2020-10-29 LAB — RAPID URINE DRUG SCREEN, HOSP PERFORMED
Amphetamines: NOT DETECTED
Barbiturates: NOT DETECTED
Benzodiazepines: POSITIVE — AB
Cocaine: NOT DETECTED
Opiates: NOT DETECTED
Tetrahydrocannabinol: NOT DETECTED

## 2020-10-29 LAB — CBG MONITORING, ED: Glucose-Capillary: 94 mg/dL (ref 70–99)

## 2020-10-29 LAB — URINALYSIS, ROUTINE W REFLEX MICROSCOPIC
Bilirubin Urine: NEGATIVE
Glucose, UA: NEGATIVE mg/dL
Hgb urine dipstick: NEGATIVE
Ketones, ur: NEGATIVE mg/dL
Nitrite: POSITIVE — AB
Protein, ur: NEGATIVE mg/dL
Specific Gravity, Urine: 1.016 (ref 1.005–1.030)
pH: 5 (ref 5.0–8.0)

## 2020-10-29 IMAGING — MR MR HEAD W/O CM
9 of 17 series · 18 of 48 positions shown · IV contrast (gadavist)
Comparison: Head CT [DATE]. MRI of the brain [DATE].

CLINICAL DATA: Neuro deficit, acute, stroke suspected. Waxing and
waning dysarthria. Dizziness with pain in head and neck. Rule out
stroke.

EXAM:
MR HEAD WITHOUT CONTRAST
MR CIRCLE OF WILLIS WITHOUT CONTRAST
MRA OF THE NECK WITHOUT AND WITH CONTRAST
TECHNIQUE: Multiplanar, multiecho pulse sequences of the brain, circle of
willis and surrounding structures were obtained without intravenous
contrast. Angiographic images of the neck were obtained using MRA
technique without and with intravenous contrast.
CONTRAST:  6mL GADAVIST GADOBUTROL 1 MMOL/ML IV SOLN

[Series 2: DWI · axial · 3.0mm · 0.94mm/px · z∈[-65,+81]mm · 2 of 99 slices shown (1 of 2)]
[im 1/99]
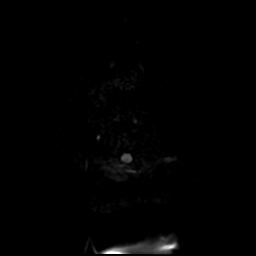
[im 99/99]
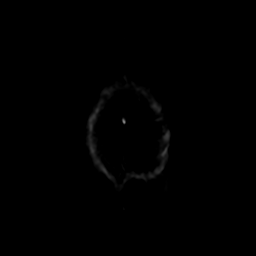

[Series 3: ax (id) · axial · 1.0mm · 0.43mm/px · z∈[-53,+34]mm · 6 of 176 slices shown]
[im 1/176]
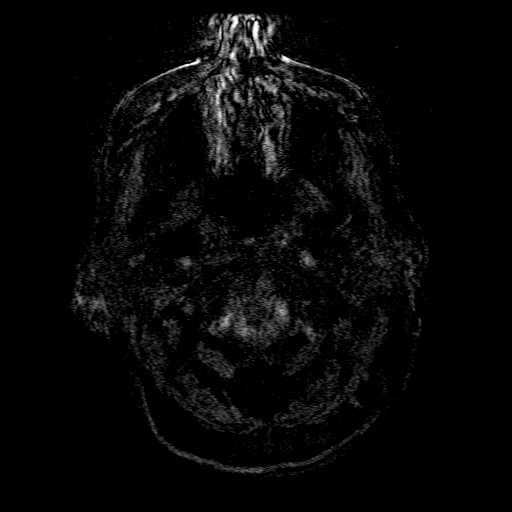
[im 36/176]
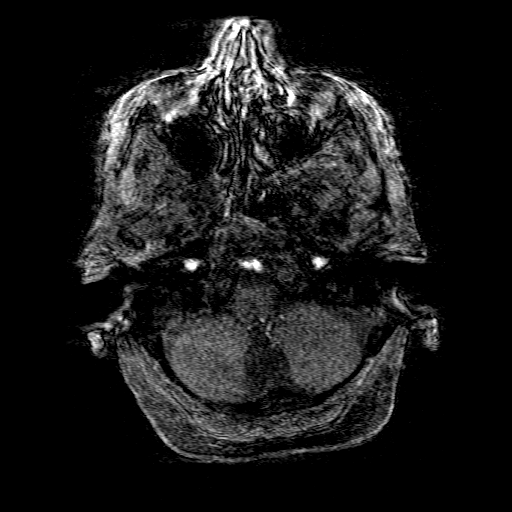
[im 71/176]
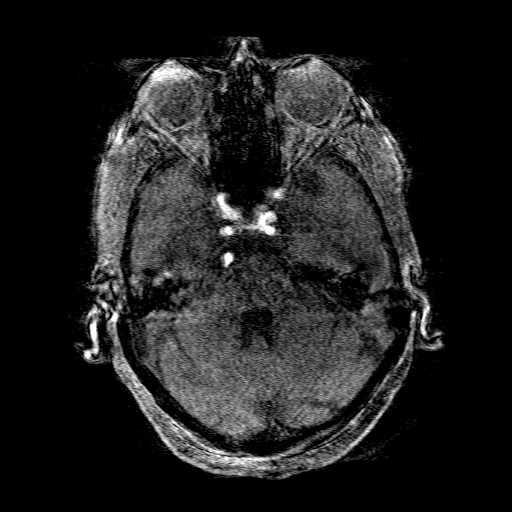
[im 106/176]
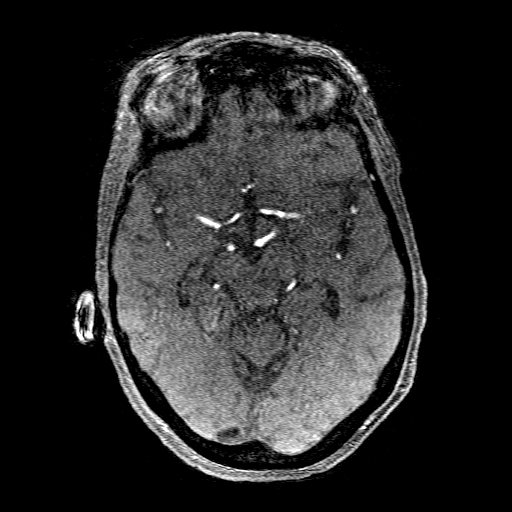
[im 141/176]
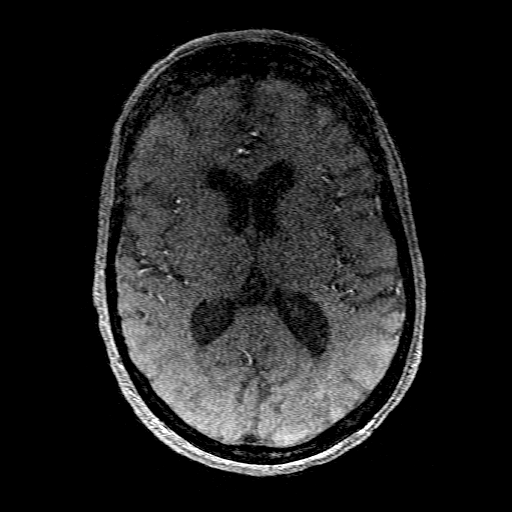
[im 176/176]
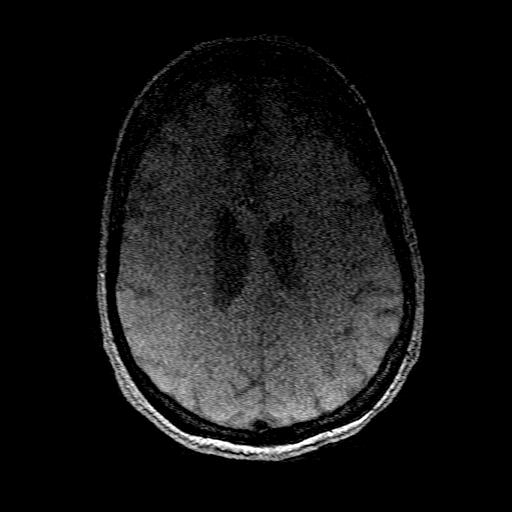

[Series 4: DWI · coronal · 4.0mm · 0.94mm/px · 3 of 74 slices shown (2 of 2)]
[im 1/74]
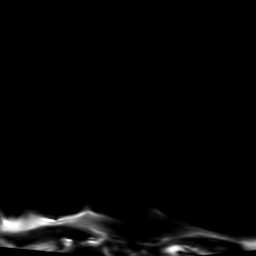
[im 37/74]
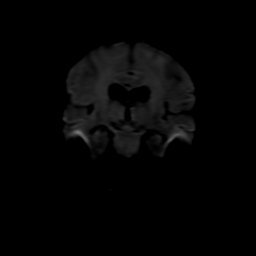
[im 74/74]
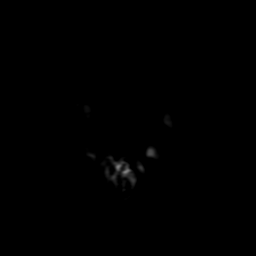

[Series 5: FLAIR · sagittal · 5.0mm · 0.23mm/px · 1 of 23 slices shown (1 of 2)]
[im 1/23]
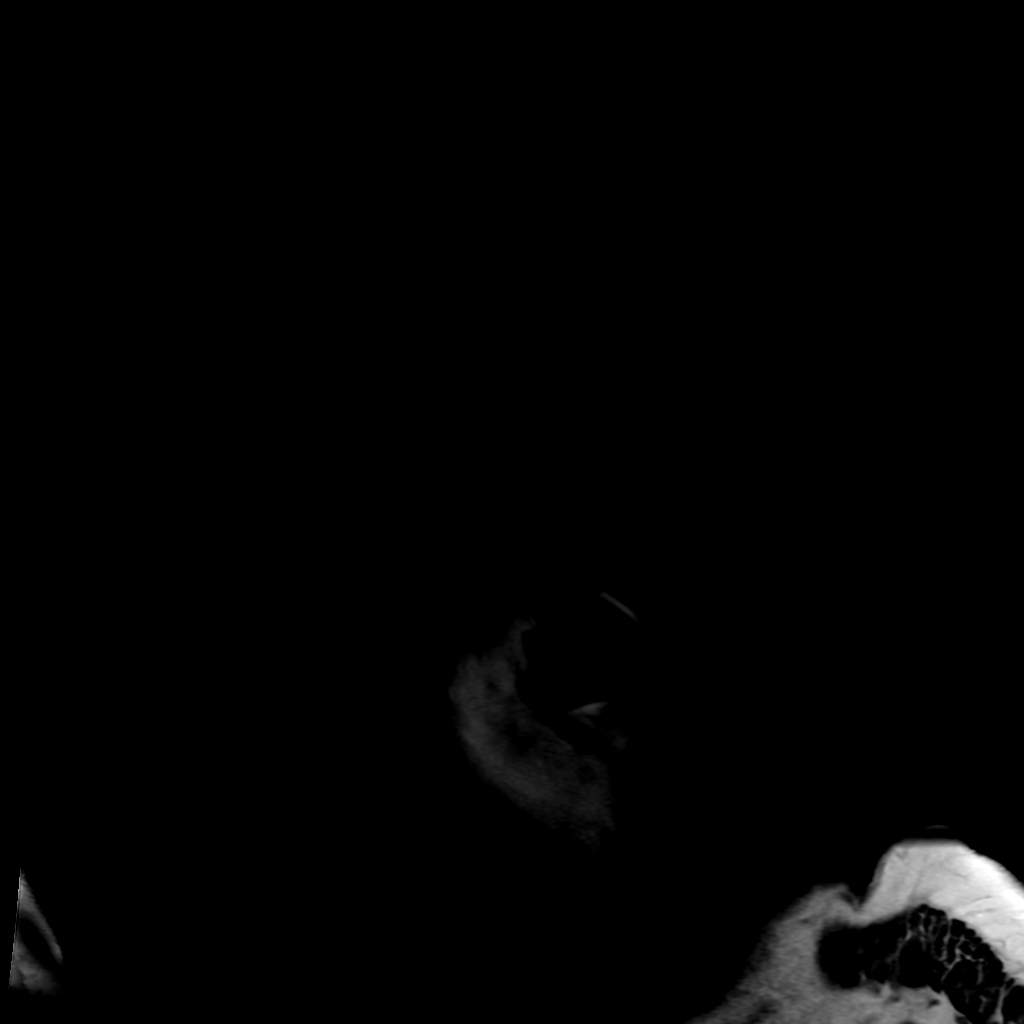

[Series 6: T2 · axial · 5.0mm · 0.23mm/px · 1 of 24 slices shown (1 of 2)]
[im 1/24]
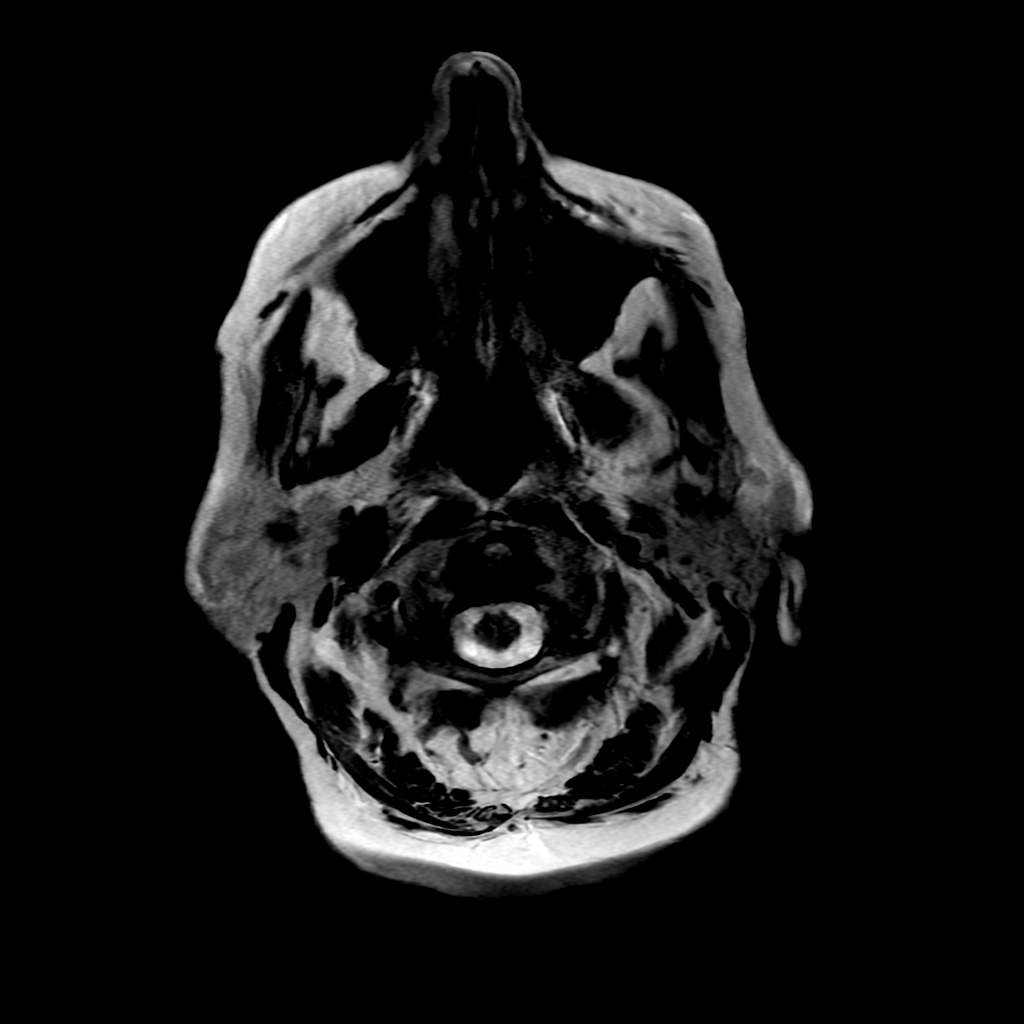

[Series 8: FLAIR · axial · 3.0mm · 0.45mm/px · 1 of 23 slices shown (2 of 2)]
[im 1/23]
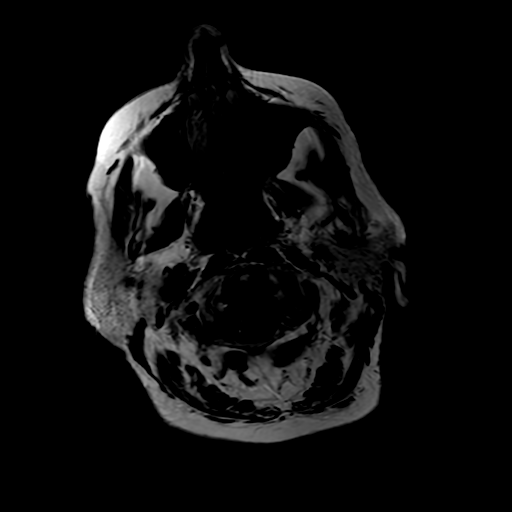

[Series 11: T2 · coronal · 5.0mm · 0.39mm/px · 1 of 29 slices shown (2 of 2)]
[im 1/29]
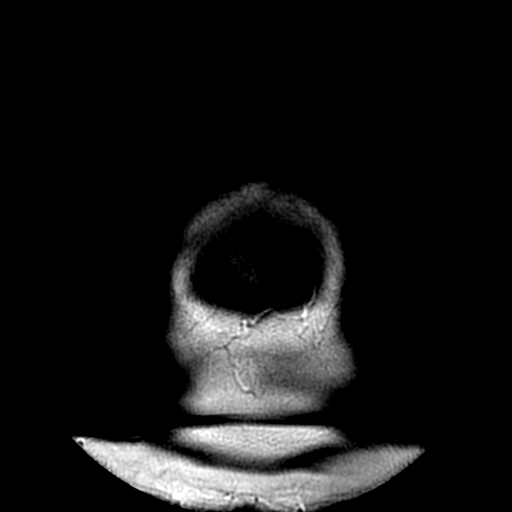

[Series 250: ADC · axial · 3.0mm · 0.94mm/px · z∈[-65,+81]mm · 2 of 50 slices shown (1 of 2)]
[im 1/50]
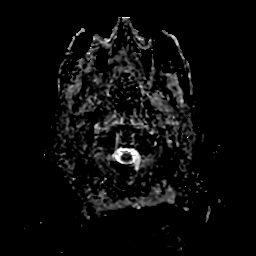
[im 50/50]
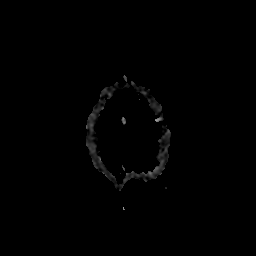

[Series 450: ADC · coronal · 4.0mm · 0.94mm/px · 1 of 37 slices shown (2 of 2)]
[im 1/37]
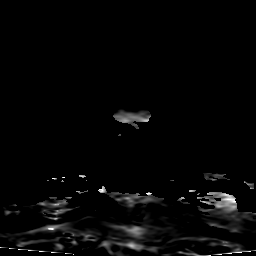

[18 of 48 positions shown; findings below may reference images not displayed]

FINDINGS: MR HEAD FINDINGS

Brain: No acute infarction, hemorrhage, hydrocephalus, extra-axial
collection or mass lesion. Scattered and confluent foci of T2
hyperintensity are seen within the white matter of the cerebral
hemispheres, thalami and within the pons, nonspecific, stable since
prior MRI.

Vascular: Normal flow voids.

Skull and upper cervical spine: Normal marrow signal.

Sinuses/Orbits: Bilateral lens surgery. Paranasal sinuses are clear.

Other: None.

MR CIRCLE OF WILLIS FINDINGS

Images are partially degraded by motion.

The visualized portions of the distal cervical and intracranial
internal carotid arteries are widely patent with normal flow related
enhancement. The bilateral anterior cerebral arteries and middle
cerebral arteries shows normal flow related enhancement. Decreased
flow related enhancement within the distal right M1/proximal M2
segment is likely artifactual. No intracranial aneurysm within the
anterior circulation.

The vertebral arteries are widely patent with antegrade flow.
Vertebrobasilar junction and basilar artery are widely patent with
antegrade flow without evidence of basilar stenosis or aneurysm.
Posterior cerebral arteries are normal bilaterally. No intracranial
aneurysm within the posterior circulation.

MRA NECK FINDINGS

Aortic arch: Normal 3 vessel aortic branching pattern. The
visualized subclavian arteries are normal.

Right carotid system: Increased tortuosity of the cervical segment
of the right ICA. The carotid bifurcation is maintained. No
significant stenosis.

Left carotid system: Increased tortuosity of the cervical segment of
the left ICA. The carotid bifurcation is maintained. No significant
stenosis.

Vertebral arteries: Mild stenosis at the origin of the right
vertebral artery. Increased tortuosity at the origin of the left
vertebral artery without significant stenosis. Normal course and
caliber of the remainder of the bilateral vertebral arteries. No
evidence of dissection.
IMPRESSION: 1. No evidence of acute intracranial abnormality.
2. Stable advanced chronic small vessel ischemic changes.
3. No intracranial large vessel occlusion or high-grade stenosis.
4. Mild stenosis at the origin of the right vertebral artery.
5. No high-grade stenosis in the neck.

## 2020-10-29 MED ORDER — MECLIZINE HCL 25 MG PO TABS: 25.0000 mg | ORAL_TABLET | Freq: Three times a day (TID) | ORAL | 0 refills | Status: DC | PRN

## 2020-10-29 MED ORDER — SODIUM CHLORIDE 0.9 % IV SOLN: INTRAVENOUS | Status: DC

## 2020-10-29 MED ORDER — GADOBUTROL 1 MMOL/ML IV SOLN
6.0000 mL | Freq: Once | INTRAVENOUS | Status: AC | PRN
  Administered 2020-10-29: 6 mL via INTRAVENOUS

## 2020-10-29 MED ORDER — CEPHALEXIN 500 MG PO CAPS: 500.0000 mg | ORAL_CAPSULE | Freq: Four times a day (QID) | ORAL | 0 refills | Status: DC

## 2020-10-29 MED ORDER — SODIUM CHLORIDE 0.9 % IV BOLUS
1000.0000 mL | Freq: Once | INTRAVENOUS | Status: AC
  Administered 2020-10-29: 1000 mL via INTRAVENOUS

## 2020-10-29 MED ORDER — SODIUM CHLORIDE 0.9 % IV SOLN
1.0000 g | Freq: Once | INTRAVENOUS | Status: AC
  Administered 2020-10-29: 1 g via INTRAVENOUS
  Filled 2020-10-29: qty 10

## 2020-10-29 MED ORDER — LORAZEPAM 2 MG/ML IJ SOLN
1.0000 mg | Freq: Once | INTRAMUSCULAR | Status: AC
  Administered 2020-10-29: 1 mg via INTRAVENOUS
  Filled 2020-10-29: qty 1

## 2020-10-29 NOTE — ED Notes (Signed)
[facility] performed with this RN and Dr. Sherry Ruffing at pt bedside. Pt stated to Dr. Sherry Ruffing that dizziness lasted around 3 hours at home

## 2020-10-29 NOTE — ED Provider Notes (Signed)
[facility] EMERGENCY DEPARTMENT Provider Note   CSN: [id number] Arrival date & time: 10/28/20  1858     History Chief Complaint  Patient presents with  . Dizziness    [name] is a 80 y.o. female.  The history is provided by the patient and medical records. No language interpreter was used.  Dizziness Quality:  Head spinning and room spinning Severity:  Severe Onset quality:  Sudden Duration:  15 hours Timing:  Unable to specify Progression:  Resolved Chronicity:  New Relieved by:  Nothing Worsened by:  Nothing Ineffective treatments:  None tried Associated symptoms: headaches, vision changes and weakness (and numbness of all extermities)   Associated symptoms: no chest pain, no diarrhea, no nausea, no palpitations, no shortness of breath and no vomiting        Past Medical History:  Diagnosis Date  . Adenocarcinoma of right lung, stage 1 ([place]) 07/28/2016  . Anxiety   . Asthma   . Cancer ([place])    uterine  . Closed fracture of left distal radius 06/13/2020  . COPD (chronic obstructive pulmonary disease) ([place])   . Coronary artery disease    mild nonobstructive by 2019 cath  . Cyst    left side of neck  . Depression   . GERD (gastroesophageal reflux disease)   . Greater trochanter fracture ([place]), left 06/11/2020  . Heart murmur   . Hyperlipidemia   . Hypertension   . Low back pain   . Osteoarthritis   . Osteoporosis   . Takotsubo cardiomyopathy    EF recovered to 50-55% by 01/23/18 echo  . Vitamin D insufficiency 06/13/2020    Patient Active Problem List   Diagnosis Date Noted  . Closed fracture of left distal radius 06/13/2020  . Vitamin D insufficiency 06/13/2020  . Greater trochanter fracture ([place]), left 06/11/2020  . Cough   . Acute metabolic encephalopathy 10/62/6948  . S/P total knee replacement 05/27/2019  . 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 Bilateral renal cysts 05/21/2018  . Hiatal hernia  05/21/2018  . 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 Thoracic radiculitis (Bilateral) (L>R) 05/03/2018  . Chronic thoracic back pain (Left) 05/03/2018  . Injury of peripheral nerves of thorax, sequela 03/22/2018  . Chest pain 03/21/2018  . Rib pain (Left) 03/21/2018  . Chronic rib pain (Right) 03/20/2018  . DDD (degenerative disc disease), lumbar 03/20/2018  . Chronic fracture of pubic ramus, sequela (Right) 03/20/2018  . History of femoral neck fracture, sequela (Left) 03/20/2018    Class: History of  . Lumbar facet arthropathy (Bilateral) 03/20/2018  . Lumbar facet syndrome (Bilateral) 03/20/2018  . Osteoarthritis of facet joint of lumbar spine 03/20/2018  . Spondylosis without myelopathy or radiculopathy, lumbosacral region 03/20/2018  . Other specified dorsopathies, sacral and sacrococcygeal region 03/20/2018  . Chronic pain syndrome 02/26/2018  . Cancer related pain 02/26/2018  . 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 Chronic low back pain (Secondary Area of Pain) (Bilateral) 02/26/2018  . 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 Chronic lower extremity pain [facility] Area of Pain) (Bilateral) (R>L) 02/26/2018  . Chronic hip pain (Fourth Area of Pain) (Bilateral) (R>L) 02/26/2018  . Neurogenic pain 02/26/2018  . Pharmacologic therapy 02/26/2018  . Problems influencing health status 02/26/2018  . Long term prescription benzodiazepine use 02/26/2018  . Long term prescription opiate use 02/26/2018  . Acute systolic heart failure ([place])   . Non-ST elevation (NSTEMI) myocardial infarction ([place])   .  History of uterine cancer 12/08/2017  . Generalized anxiety disorder 12/08/2017  . GERD (gastroesophageal reflux disease) 12/08/2017  . Substernal chest pain 12/08/2017  . Elevated brain natriuretic peptide (BNP) level 12/08/2017  . Elevated troponin 12/08/2017  . Embedded tick of upper back excluding scapular region  12/08/2017  . Chronic sacroiliac joint pain 06/21/2017  . Disorder of skeletal system 06/21/2017  . Other long term (current) drug therapy 06/21/2017  . Other specified health status 06/21/2017  . Chronic chest wall pain (Primary Area of Pain) 06/21/2017  . Chronic post-thoracotomy pain 03/03/2017  . 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 Adenocarcinoma of right lung, stage 1 ([place]) 07/28/2016  . S/P lobectomy of lung 05/09/2016  . DVT (deep venous thrombosis) ([place]) 04/26/2016  . Heart murmur   . Acute exacerbation of chronic obstructive pulmonary disease (COPD) ([place]) 08/29/2015  . Essential hypertension 08/29/2015  . Hypercholesterolemia 08/29/2015  . CKD (chronic kidney disease), stage III ([place]) 08/29/2015  . COPD (chronic obstructive pulmonary disease) ([place]) 04/27/2015  . Acute on chronic respiratory failure with hypoxia ([place]) 04/27/2015  . Infected sebaceous cyst 08/16/2011    Past Surgical History:  Procedure Laterality Date  . ANKLE SURGERY Right    horse accident  . APPENDECTOMY    . BACK SURGERY    . CHOLECYSTECTOMY    . EYE SURGERY     lense implant  . FRACTURE SURGERY    . HIP SURGERY  01/2010   Left Hip   . INNER EAR SURGERY Bilateral 1970   related to severe ear infections  . KNEE SURGERY     Right knee  . LEFT HEART CATH AND CORONARY ANGIOGRAPHY N/A 12/11/2017   Procedure: LEFT HEART CATH AND CORONARY ANGIOGRAPHY;  Surgeon: Jettie Booze, MD;  Location: [facility];  Service: Cardiovascular;  Laterality: N/A;  . LOBECTOMY Right 05/09/2016   Procedure: RIGHT UPPER LOBECTOMY;  Surgeon: [name], MD;  Location: [facility];  Service: Thoracic;  Laterality: Right;  . MASS EXCISION  08/25/2011   Procedure: EXCISION MASS;  Surgeon: Harl Bowie, MD;  Location: [facility];  Service: General;  Laterality: N/A;  excision left neck mass  . OPEN REDUCTION INTERNAL FIXATION (ORIF) DISTAL RADIAL FRACTURE Left 06/12/2020    Procedure: OPEN REDUCTION INTERNAL FIXATION (ORIF) DISTAL RADIAL FRACTURE;  Surgeon: Shona Needles, MD;  Location: [facility];  Service: Orthopedics;  Laterality: Left;  . TOTAL ABDOMINAL HYSTERECTOMY    . TOTAL KNEE ARTHROPLASTY Right 05/27/2019   Procedure: TOTAL KNEE ARTHROPLASTY;  Surgeon: Vickey Huger, MD;  Location: [facility];  Service: Orthopedics;  Laterality: Right;  75 mins needed for length of case  . TOTAL KNEE ARTHROPLASTY Left 12/30/2019   Procedure: TOTAL KNEE ARTHROPLASTY;  Surgeon: Vickey Huger, MD;  Location: [facility];  Service: Orthopedics;  Laterality: Left;  Marland Kitchen VIDEO ASSISTED THORACOSCOPY (VATS)/WEDGE RESECTION Right 05/09/2016   Procedure: VIDEO ASSISTED THORACOSCOPY (VATS)/WEDGE RESECTION;  Surgeon: [name], MD;  Location: [facility];  Service: Thoracic;  Laterality: Right;     OB History   No obstetric history on file.     Family History  Problem Relation Age of Onset  . Heart disease Mother   . Heart disease Father   . Cancer Sister        #1, breast  . Heart disease Brother        had CABG    Social History   Tobacco Use  . Smoking status: Former Smoker  Packs/day: 2.00    Years: 51.00    Pack years: 102.00    Types: Cigarettes    Quit date: 05/04/2012    Years since quitting: 8.4  . Smokeless tobacco: Never Used  Vaping Use  . Vaping Use: Former  . Quit date: 12/01/2017  Substance Use Topics  . Alcohol use: No    Alcohol/week: 0.0 standard drinks  . Drug use: No    Home Medications Prior to Admission medications   Medication Sig Start Date End Date Taking? Authorizing Provider  acetaminophen (TYLENOL) 500 MG tablet Take 2 tablets (1,000 mg total) by mouth every 6 (six) hours. Patient taking differently: Take 1,000 mg by mouth every 6 (six) hours as needed for moderate pain.  05/28/19   Donia Ast, PA  albuterol (PROVENTIL HFA;VENTOLIN HFA) 108 (90 BASE) MCG/ACT inhaler Inhale 2 puffs into the lungs every 6 (six) hours as  needed. Patient taking differently: Inhale 2 puffs into the lungs every 4 (four) hours as needed for wheezing or shortness of breath.  04/28/15   Hosie Poisson, MD  albuterol (PROVENTIL) (2.5 MG/3ML) 0.083% nebulizer solution Take 3 mLs (2.5 mg total) by nebulization every 2 (two) hours as needed for wheezing or shortness of breath. 04/28/15   Hosie Poisson, MD  aluminum-magnesium hydroxide-simethicone (MAALOX) 373-428-76 MG/5ML SUSP Take 30 mLs by mouth 3 (three) times daily as needed (indigestion).     [provider]  amLODipine (NORVASC) 5 MG tablet Take 5 mg by mouth daily.    [provider]  aspirin EC 325 MG EC tablet Take 1 tablet (325 mg total) by mouth 2 (two) times daily. Patient taking differently: Take 162.5 mg by mouth 2 (two) times daily.  12/31/19   Donia Ast, PA  atorvastatin (LIPITOR) 20 MG tablet Take 20 mg by mouth every evening.     [provider]  diazepam (VALIUM) 5 MG tablet Take 0.5 tablets (2.5 mg total) by mouth every 8 (eight) hours as needed for anxiety. Must last 30 days. 06/16/20   Lavina Hamman, MD  ferrous sulfate 325 (65 FE) MG tablet Take 1 tablet (325 mg total) by mouth daily with breakfast. 06/16/20   Lavina Hamman, MD  furosemide (LASIX) 20 MG tablet Take 20 mg by mouth every Monday, Wednesday, and Friday. Monday and Wednesday 11/04/19   [provider]  gabapentin (NEURONTIN) 400 MG capsule Take 1 capsule (400 mg total) by mouth 4 (four) times daily. Patient taking differently: Take 400 mg by mouth 3 (three) times daily.  05/24/18 06/11/20  Vevelyn Francois, NP  meloxicam (MOBIC) 7.5 MG tablet Take 7.5 mg by mouth daily.    [provider]  metoCLOPramide (REGLAN) 5 MG tablet Take 5 mg by mouth 2 (two) times daily.     [provider]  metoprolol tartrate (LOPRESSOR) 25 MG tablet Take 0.5 tablets (12.5 mg total) by mouth 2 (two) times daily. 12/12/17   Domenic Polite, MD  mirtazapine (REMERON) 7.5  MG tablet Take 7.5 mg by mouth at bedtime. 11/04/19   [provider]  Multiple Vitamin (MULTIVITAMIN WITH MINERALS) TABS tablet Take 1 tablet by mouth daily. 06/17/20   Lavina Hamman, MD  oxybutynin (DITROPAN-XL) 5 MG 24 hr tablet Take 5 mg by mouth at bedtime. 09/06/19   [provider]  pantoprazole (PROTONIX) 40 MG tablet Take 1 tablet (40 mg total) by mouth daily at 6 (six) AM. 04/28/15   Hosie Poisson, MD  raloxifene (EVISTA) 60  MG tablet Take 60 mg by mouth daily.    [provider]  sertraline (ZOLOFT) 100 MG tablet Take 100 mg by mouth in the morning and at bedtime.     [provider]  tiZANidine (ZANAFLEX) 2 MG tablet Take 1 tablet (2 mg total) by mouth every 6 (six) hours as needed. Patient taking differently: Take 2 mg by mouth every 6 (six) hours as needed for muscle spasms.  05/28/19   Donia Ast, PA  traMADol (ULTRAM) 50 MG tablet Take 1 tablet (50 mg total) by mouth 3 (three) times daily as needed for moderate pain. 06/16/20   Lavina Hamman, MD  TRELEGY ELLIPTA 100-62.5-25 MCG/INH AEPB Inhale 1 puff into the lungs daily. 05/14/19   [provider]  Vitamin D, Ergocalciferol, (DRISDOL) 1.25 MG (50000 UNIT) CAPS capsule Take 1 capsule (50,000 Units total) by mouth every 7 (seven) days. 06/20/20   Lavina Hamman, MD    Allergies    Nicki [place] sodium], Lyrica [pregabalin], and Levofloxacin  Review of Systems   Review of Systems  Constitutional: Negative for chills, diaphoresis, fatigue and fever.  HENT: Negative for congestion.   Eyes: Positive for visual disturbance. Negative for photophobia.  Respiratory: Negative for cough, chest tightness and shortness of breath.   Cardiovascular: Negative for chest pain and palpitations.  Gastrointestinal: Negative for abdominal pain, diarrhea, nausea and vomiting.  Genitourinary: Negative for flank pain and frequency.  Musculoskeletal: Positive for neck pain. Negative for back  pain.  Skin: Negative for rash and wound.  Neurological: Positive for dizziness, speech difficulty, weakness (and numbness of all extermities), numbness and headaches. Negative for seizures and facial asymmetry.  Psychiatric/Behavioral: Negative for agitation and confusion.  All other systems reviewed and are negative.   Physical Exam Updated Vital Signs BP (!) 149/82   Pulse 70   Temp (!) 97.4 F (36.3 C) (Oral)   Resp 16   SpO2 97%   Physical Exam Vitals and nursing note reviewed.  Constitutional:      General: She is not in acute distress.    Appearance: She is well-developed. She is not ill-appearing, toxic-appearing or diaphoretic.  HENT:     Head: Normocephalic and atraumatic.     Mouth/Throat:     Mouth: Mucous membranes are moist.     Pharynx: No oropharyngeal exudate or posterior oropharyngeal erythema.  Eyes:     Extraocular Movements:     Right eye: Nystagmus present.     Left eye: Nystagmus present.     Conjunctiva/sclera: Conjunctivae normal.     Pupils: Pupils are equal, round, and reactive to light.     Comments: Mild horizontal nystagmus with eye movement.  Neck:     Vascular: No carotid bruit.  Cardiovascular:     Rate and Rhythm: Normal rate and regular rhythm.     Pulses: Normal pulses.     Heart sounds: No murmur heard.   Pulmonary:     Effort: Pulmonary effort is normal. No respiratory distress.     Breath sounds: Normal breath sounds.  Abdominal:     General: Abdomen is flat.     Palpations: Abdomen is soft.     Tenderness: There is no abdominal tenderness. There is no right CVA tenderness, left CVA tenderness, guarding or rebound.  Musculoskeletal:        General: No tenderness.     Cervical back: Neck supple. No tenderness.     Right lower leg: No edema.  Left lower leg: No edema.  Skin:    General: Skin is warm and dry.     Capillary Refill: Capillary refill takes less than 2 seconds.     Findings: No erythema.  Neurological:      General: No focal deficit present.     Mental Status: She is alert and oriented to person, place, and time.     Cranial Nerves: No cranial nerve deficit.     Sensory: No sensory deficit.     Motor: No weakness.  Psychiatric:        Mood and Affect: Mood normal.     ED Results / Procedures / Treatments   Labs (all labs ordered are listed, but only abnormal results are displayed) Labs Reviewed  CBC WITH DIFFERENTIAL/PLATELET - Abnormal; Notable for the following components:      Result Value   Hemoglobin 11.6 (*)    RDW 16.1 (*)    All other components within normal limits  COMPREHENSIVE METABOLIC PANEL - Abnormal; Notable for the following components:   Glucose, Bld 101 (*)    Creatinine, Ser 1.87 (*)    Albumin 3.3 (*)    GFR, Estimated 27 (*)    All other components within normal limits  PROTIME-INR  APTT  URINALYSIS, ROUTINE W REFLEX MICROSCOPIC  CBC  DIFFERENTIAL  RAPID URINE DRUG SCREEN, HOSP PERFORMED  BASIC METABOLIC PANEL  I-STAT CHEM 8, ED  CBG MONITORING, ED    EKG EKG Interpretation  Date/Time:  Wednesday October 28 2020 19:06:08 EDT Ventricular Rate:  71 PR Interval:  148 QRS Duration: 72 QT Interval:  426 QTC Calculation: 462 R Axis:   29 Text Interpretation: Normal sinus rhythm Normal ECG When compared to prior, shorter QTc. No STEMI Confirmed by Antony Blackbird [phone]) on 10/29/2020 4:03:39 AM   Radiology CT HEAD WO CONTRAST  Result Date: 10/28/2020 CLINICAL DATA:  Acute neurological deficit.  Dizziness. EXAM: CT HEAD WITHOUT CONTRAST TECHNIQUE: Contiguous axial images were obtained from the base of the skull through the vertex without intravenous contrast. COMPARISON:  06/10/2020 FINDINGS: Brain: No evidence of acute infarction, hemorrhage, hydrocephalus, extra-axial collection or mass lesion/mass effect. Diffuse cerebral atrophy. Ventricular dilatation likely due to central atrophy. Low-attenuation changes in the deep white matter consistent small vessel  ischemia. Vascular: Moderate intracranial arterial vascular calcifications. Skull: Calvarium appears intact. Sinuses/Orbits: Paranasal sinuses and mastoid air cells are clear. Loss of mastoid septations likely representing previous surgery bilaterally. Other: No change since prior study. IMPRESSION: 1. No acute intracranial abnormalities. 2. Chronic atrophy and small vessel ischemia. Electronically Signed   By: Lucienne Capers M.D.   On: 10/28/2020 20:23    Procedures Procedures   Medications Ordered in ED Medications  sodium chloride flush (NS) 0.9 % injection 3 mL (3 mLs Intravenous Given 10/29/20 0423)  sodium chloride 0.9 % bolus 1,000 mL (0 mLs Intravenous Stopped 10/29/20 7371)    ED Course  I have reviewed the triage vital signs and the nursing notes.  Pertinent labs & imaging results that were available during my care of the patient were reviewed by me and considered in my medical decision making (see chart for details).    MDM Rules/Calculators/A&P                          [name] is a 80 y.o. female with a past medical history significant for CAD, prior Takotsubo cardiomyopathy, asthma, depression, hypertension, GERD, COPD, anxiety, hyperlipidemia, and prior uterine  cancer who presents with sudden onset of pain in her neck going into head and associated dysarthria, dizziness, diplopia/blurry vision, and generalized numbness and tingling.  Patient reports that around 2 PM today, patient had onset of symptoms.  She reports that in the morning and last several days she has been feeling well.  She reports some pain in her neck going into her head that was moderate to severe.  She says that she started having dizziness and could barely stand up.  She felt like everything was spinning.  This is new.  She denies history of vertigo.  She reports she was having some double vision and what she thought was blurry vision as well.  She denied nausea or vomiting but reports when she was  speaking to family, she felt her voice was garbled and sounded abnormal.  She denies difficulty getting the words out.  She reports all extremities feeling tingly and slightly weaker than baseline.  She denies any chest pain or palpitations.  Denies urinary changes, constipation, diarrhea.  Denies any pain in her torso.  Says she is never had this before.  On exam, lungs clear and chest nontender.  Abdomen nontender.  Patient has no focal neurologic deficits on my cell exam with normal sensation and strength in extremities.  Normal finger-nose-finger testing bilaterally.  Symmetric smile.  Normal sensation of the face.  Pupils are symmetric reactive.  She does have some mild horizontal nystagmus with lateral extraocular movement testing.  Speech sounded clear to me the patient feels that her speech still sounds slightly different.  Exam otherwise unremarkable.    Patient was seen in triage initially.  Patient has been in the ED approximately 9-1/2 hours prior to my initial evaluation.  Patient had some screening labs and imaging collected including a normal CBG.  Metabolic panel shows elevated creatinine with an AKI compared to prior.  CBC reassuring.  CT head showed chronic microvascular changes with no acute abnormality seen.  No large hemorrhagic stroke seen.  Clinically I am concerned about vertebral artery dissection with the pain in her neck going to her head associated with the symptoms.  As her symptoms of speech are reportedly very mild but still persistent, I feel she will need MRI.  Her creatinine is elevated compared to baseline and GFR is very close to 30 at 27.  Will discuss with neurology best imaging given these findings.   5:01 AM As patient cannot get CTA of the head and neck, neurology recommends MRA head and neck and MRI brain.  Will reassess after imaging to determine disposition if she is having dissection or acute stroke.  5:06 AM As the patient is very borderline unable to get  contrast for a CTa of the head and neck, neurology recommended 1 L of normal saline bolus and a recheck of the GFR.  If it is improved, they would recommend a CTA head and neck and canceled the MRA head and neck as it is not as good as the CTA for dissection.  We will keep the MRI ordered in place as it may take several hours to get the MRI collected.   7:29 AM Care transferred to oncoming team while waiting for results of imaging of the head and neck and reassessment by neurology.  Final Clinical Impression(s) / ED Diagnoses Final diagnoses:  Dizziness  Dysarthria  Transient vision disturbance  Neck pain  Acute nonintractable headache, unspecified headache type    Clinical Impression: 1. Dizziness   2. Dysarthria  3. Transient vision disturbance   4. Neck pain   5. Acute nonintractable headache, unspecified headache type     Disposition: Admit  This note was prepared with assistance of Dragon voice recognition software. Occasional wrong-word or sound-a-like substitutions may have occurred due to the inherent limitations of voice recognition software.      Charee Tumblin, Gwenyth Allegra, MD 10/29/20 0730

## 2020-10-29 NOTE — ED Provider Notes (Signed)
Patient's MRI/MRA results discussed with Dr. Malen Gauze from neuro hospitalist service.  Felt that they were fine and patient showed no evidence of any stroke.  Urinalysis did show evidence of a urinary tract infection urine culture sent.  Patient given 1 g of Rocephin here and will be continued on Keflex at home.  Patient still with occasional dizziness spells which could be a component of vertigo so patient will be treated with a low-dose of Ativan for this.  Patient's renal function which was concern initially showed significant improvement with IV fluid hydration.  Her GFR now is 40.  Patient stable for discharge home and follow-up with her primary care doctor in the next week for reevaluation of this.  As well for the dizziness and vertigo.  Patient lives at home with her son.   Fredia Sorrow, MD 10/29/20 [phone]

## 2020-10-29 NOTE — Discharge Instructions (Addendum)
Work-up here today negative for any evidence of stroke.  Does appear that you have a urinary tract infection.  Urine culture has been sent.  Take the Keflex as directed for the next 7 days.  Also take the Antivert for any of the dizziness spells.  You will need to make an appointment to follow-up with your primary care doctor for the dizzy spells and also for the renal insufficiency that we found today which improved with fluids.  Make sure you hydrate yourself well.  Recommend you get seen by your primary care doctor in the next week.  Return for any new or worse symptoms

## 2020-10-31 LAB — URINE CULTURE: Culture: >=100000 — AB

## 2020-11-01 ENCOUNTER — Telehealth: Payer: Self-pay | Admitting: Emergency Medicine

## 2020-11-01 NOTE — Telephone Encounter (Signed)
Post ED Visit - Positive Culture Follow-up  Culture report reviewed by antimicrobial stewardship pharmacist: [facility] []  Elenor Quinones, Pharm.D. []  Heide Guile, Pharm.D., BCPS AQ-ID []  Parks Neptune, Pharm.D., BCPS []  Alycia Rossetti, Pharm.D., BCPS []  [place], Pharm.D., BCPS, AAHIVP []  Legrand Como, Pharm.D., BCPS, AAHIVP [x]  Salome Arnt, PharmD, BCPS []  Johnnette Gourd, PharmD, BCPS []  Hughes Better, PharmD, BCPS []  Leeroy Cha, PharmD []  Laqueta Linden, PharmD, BCPS []  Albertina Parr, PharmD  [place] Team []  Leodis Sias, PharmD []  Lindell Spar, PharmD []  Royetta Asal, PharmD []  Graylin Shiver, Rph []  Rema Fendt) Glennon Mac, PharmD []  Arlyn Dunning, PharmD []  Netta Cedars, PharmD []  Dia Sitter, PharmD []  Leone Haven, PharmD []  Gretta Arab, PharmD []  Theodis Shove, PharmD []  Peggyann Juba, PharmD []  Reuel Boom, PharmD   Positive urine culture Treated with Cephalexin, organism sensitive to the same and no further patient follow-up is required at this time.  Sandi Raveling Jamyla Ard 11/01/2020, 4:22 PM

## 2020-11-09 ENCOUNTER — Other Ambulatory Visit: Payer: Self-pay | Admitting: Internal Medicine

## 2020-11-09 ENCOUNTER — Ambulatory Visit
Admission: RE | Admit: 2020-11-09 | Discharge: 2020-11-09 | Disposition: A | Discharge status: Home / Self Care | Payer: Medicare Other | Source: Ambulatory Visit | Attending: Internal Medicine | Admitting: Internal Medicine

## 2020-11-09 DIAGNOSIS — G8929 Other chronic pain: Secondary | ICD-10-CM | POA: Diagnosis not present

## 2020-11-09 DIAGNOSIS — N183 Chronic kidney disease, stage 3 unspecified: Secondary | ICD-10-CM | POA: Diagnosis not present

## 2020-11-09 DIAGNOSIS — M25522 Pain in left elbow: Secondary | ICD-10-CM

## 2020-11-09 DIAGNOSIS — K219 Gastro-esophageal reflux disease without esophagitis: Secondary | ICD-10-CM | POA: Diagnosis not present

## 2020-11-09 DIAGNOSIS — M810 Age-related osteoporosis without current pathological fracture: Secondary | ICD-10-CM | POA: Diagnosis not present

## 2020-11-09 DIAGNOSIS — C3491 Malignant neoplasm of unspecified part of right bronchus or lung: Secondary | ICD-10-CM | POA: Diagnosis not present

## 2020-11-09 DIAGNOSIS — S6991XA Unspecified injury of right wrist, hand and finger(s), initial encounter: Secondary | ICD-10-CM

## 2020-11-09 DIAGNOSIS — I10 Essential (primary) hypertension: Secondary | ICD-10-CM | POA: Diagnosis not present

## 2020-11-09 DIAGNOSIS — I5020 Unspecified systolic (congestive) heart failure: Secondary | ICD-10-CM | POA: Diagnosis not present

## 2020-11-09 DIAGNOSIS — M19041 Primary osteoarthritis, right hand: Secondary | ICD-10-CM | POA: Diagnosis not present

## 2020-11-09 DIAGNOSIS — M7989 Other specified soft tissue disorders: Secondary | ICD-10-CM | POA: Diagnosis not present

## 2020-11-09 DIAGNOSIS — E782 Mixed hyperlipidemia: Secondary | ICD-10-CM | POA: Diagnosis not present

## 2020-11-09 DIAGNOSIS — S62390A Other fracture of second metacarpal bone, right hand, initial encounter for closed fracture: Secondary | ICD-10-CM | POA: Diagnosis not present

## 2020-11-09 DIAGNOSIS — J449 Chronic obstructive pulmonary disease, unspecified: Secondary | ICD-10-CM | POA: Diagnosis not present

## 2020-11-09 DIAGNOSIS — M1990 Unspecified osteoarthritis, unspecified site: Secondary | ICD-10-CM | POA: Diagnosis not present

## 2020-11-09 DIAGNOSIS — D501 Sideropenic dysphagia: Secondary | ICD-10-CM | POA: Diagnosis not present

## 2020-11-09 IMAGING — DX DG HAND COMPLETE 3+V*R*
3 series · 3 of 3 positions shown · non-contrast
Comparison: None.

CLINICAL DATA: Right hand pain and swelling

EXAM:
RIGHT HAND - COMPLETE 3+ VIEW

[dg hand complete right (1 of 3)]
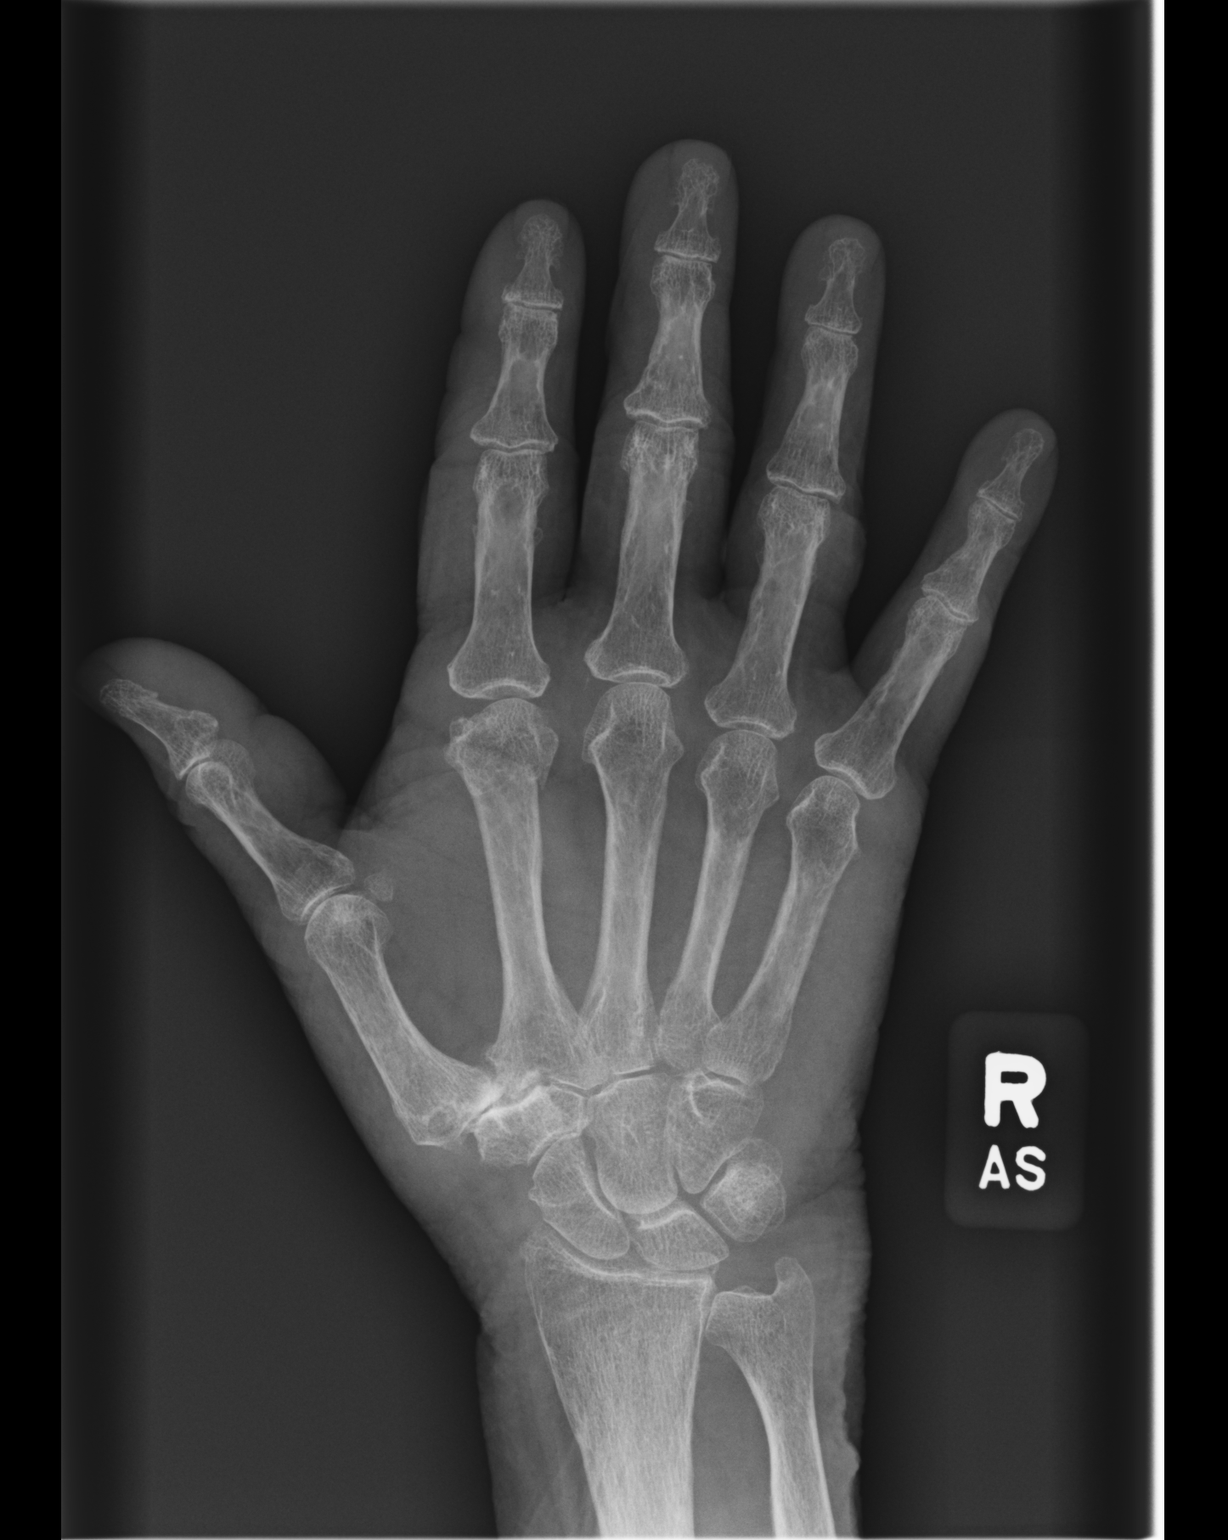

[dg hand complete right (2 of 3)]
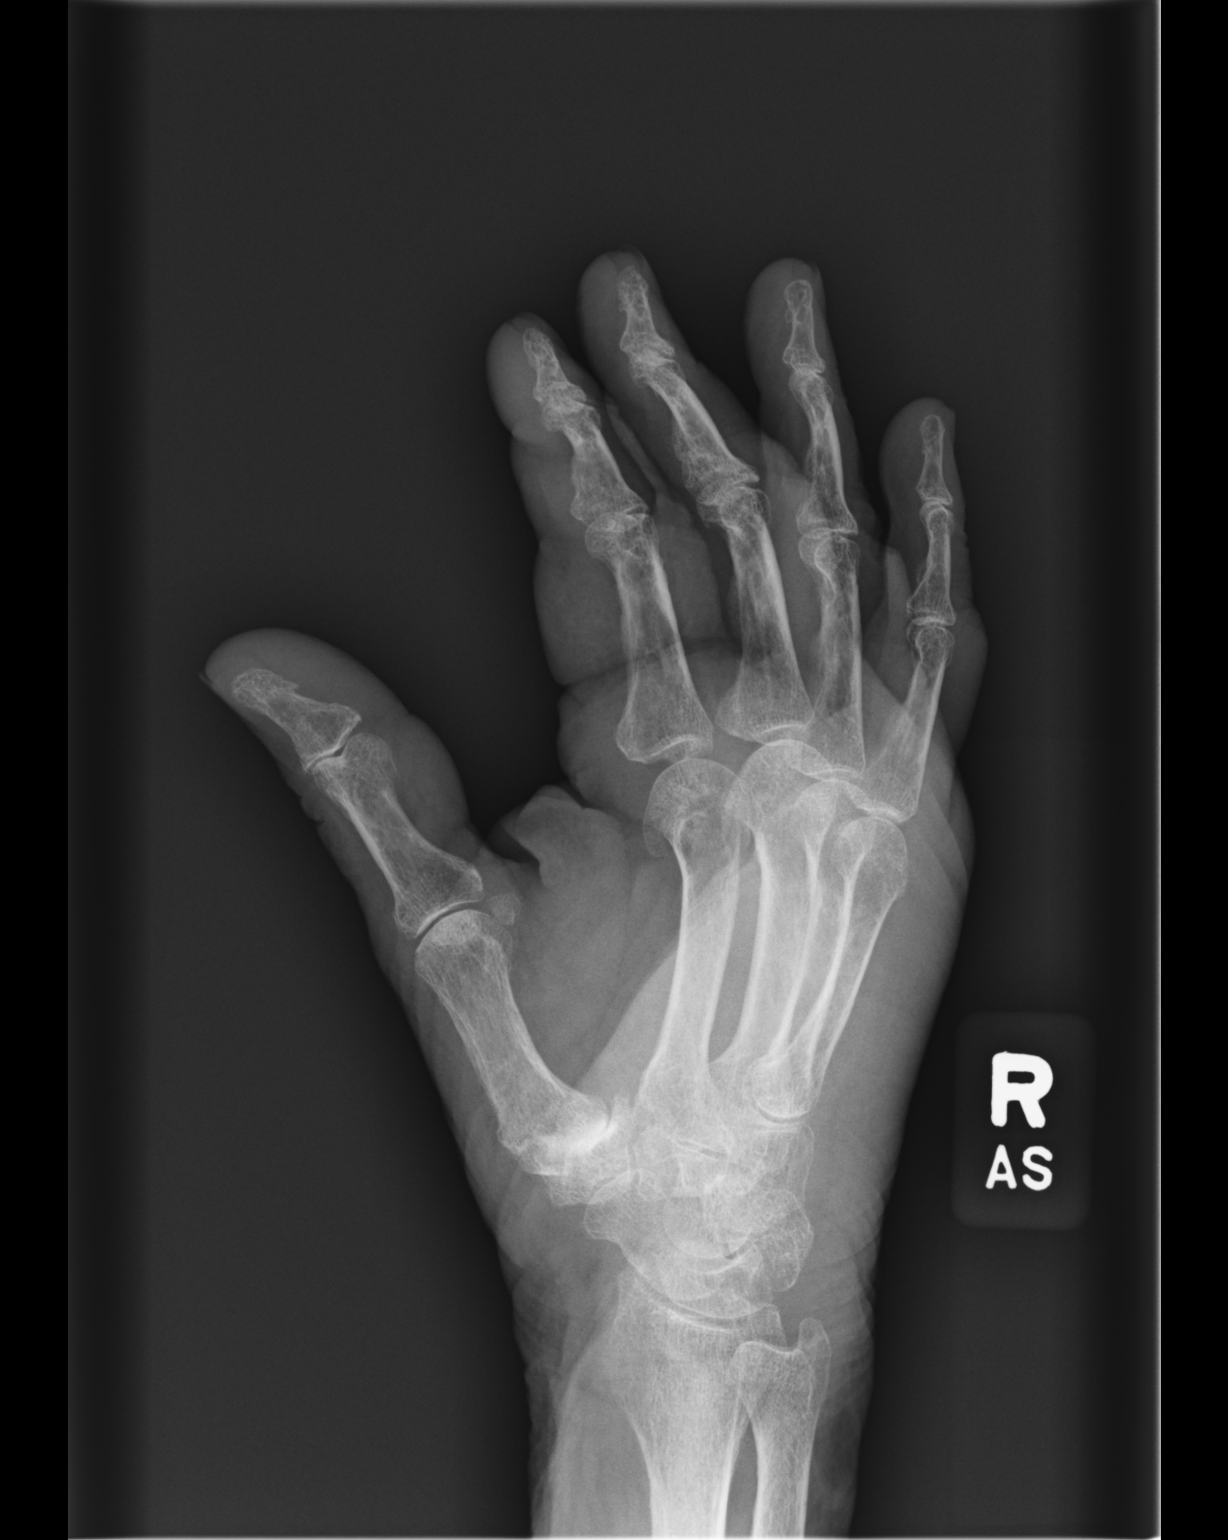

[dg hand complete right (3 of 3)]
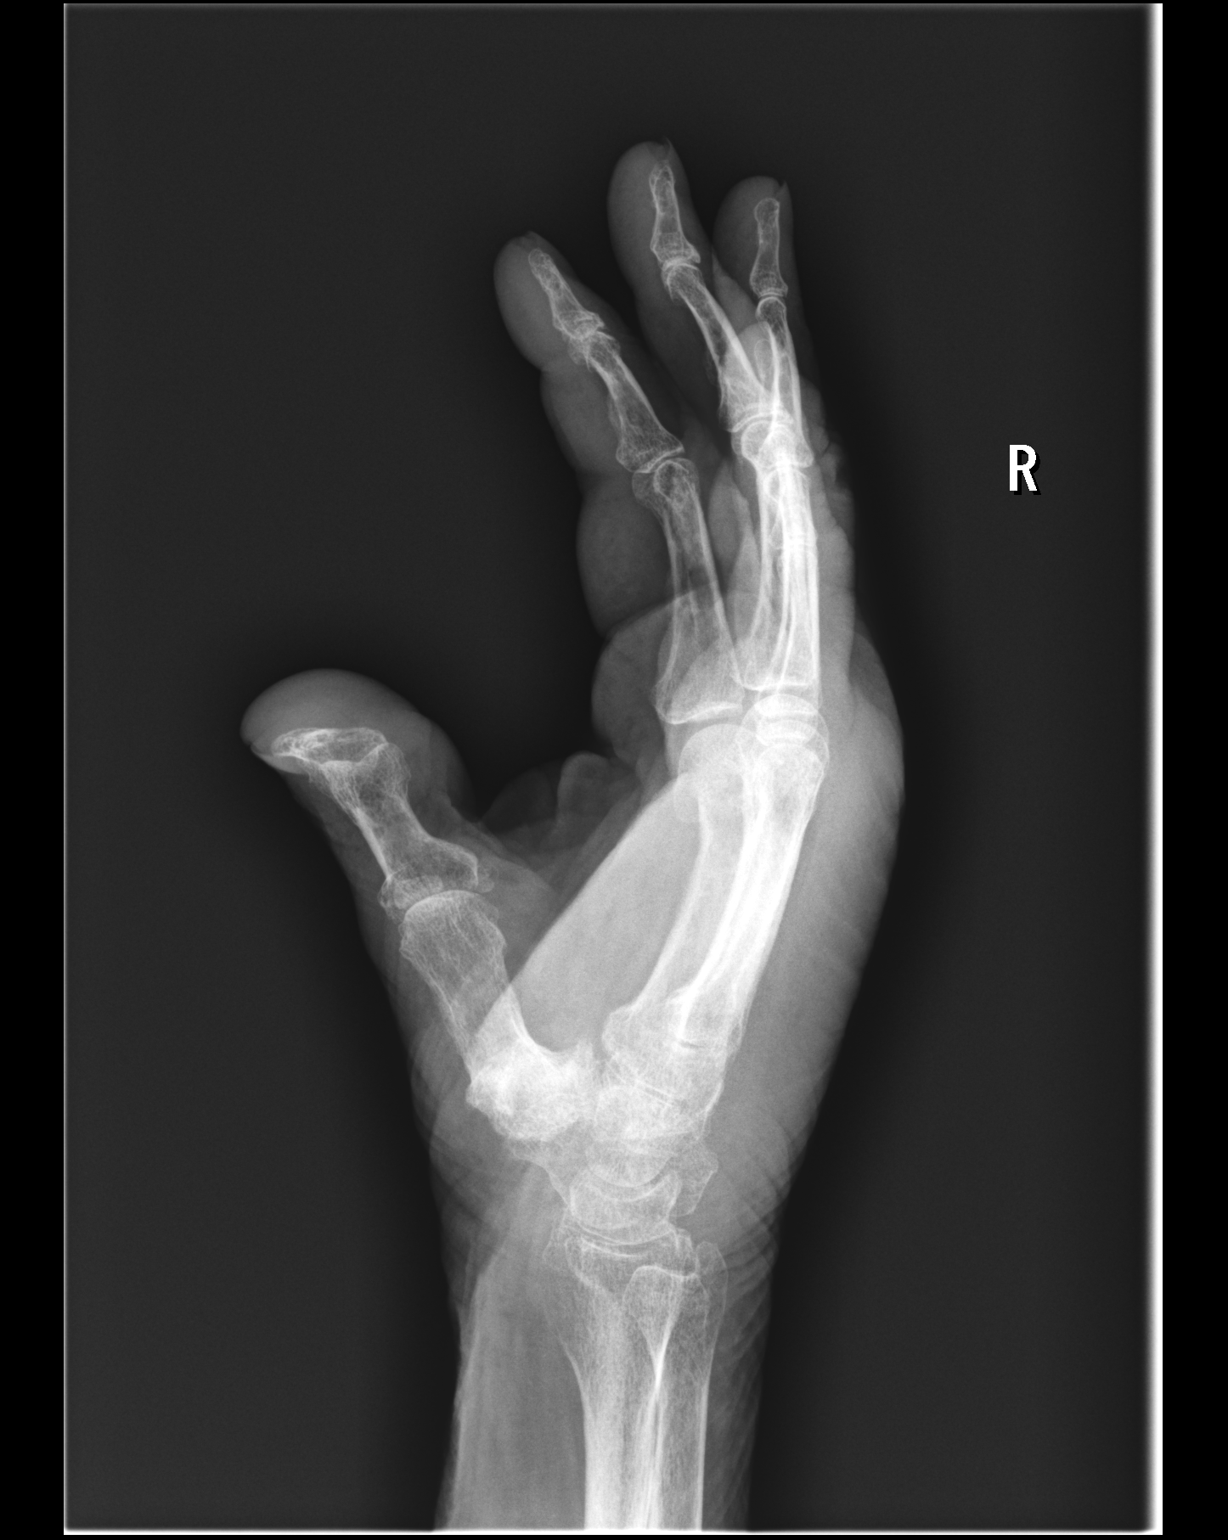

[3 of 3 positions shown; findings below may reference images not displayed]

FINDINGS: Generalized osteopenia. Nondisplaced fracture of the right second
metacarpal head. No other fracture or dislocation. Severe
osteoarthritis of the first CMC joint. Mild osteoarthritis of the
scaphotrapeziotrapezoid joint. Mild osteoarthritis of the PIP and D
IP joints. Soft tissue swelling along the dorsal aspect of the hand.
IMPRESSION: 1. Acute nondisplaced fracture of the right second metacarpal head.
2. Osteoarthritis of the right hand as described above.

## 2020-11-09 IMAGING — DX DG ELBOW COMPLETE 3+V*L*
4 series · 4 of 4 positions shown · non-contrast
Comparison: None.

CLINICAL DATA: Left elbow pain

EXAM:
LEFT ELBOW - COMPLETE 3+ VIEW

[dg elbow complete left (3+view) (1 of 4)]
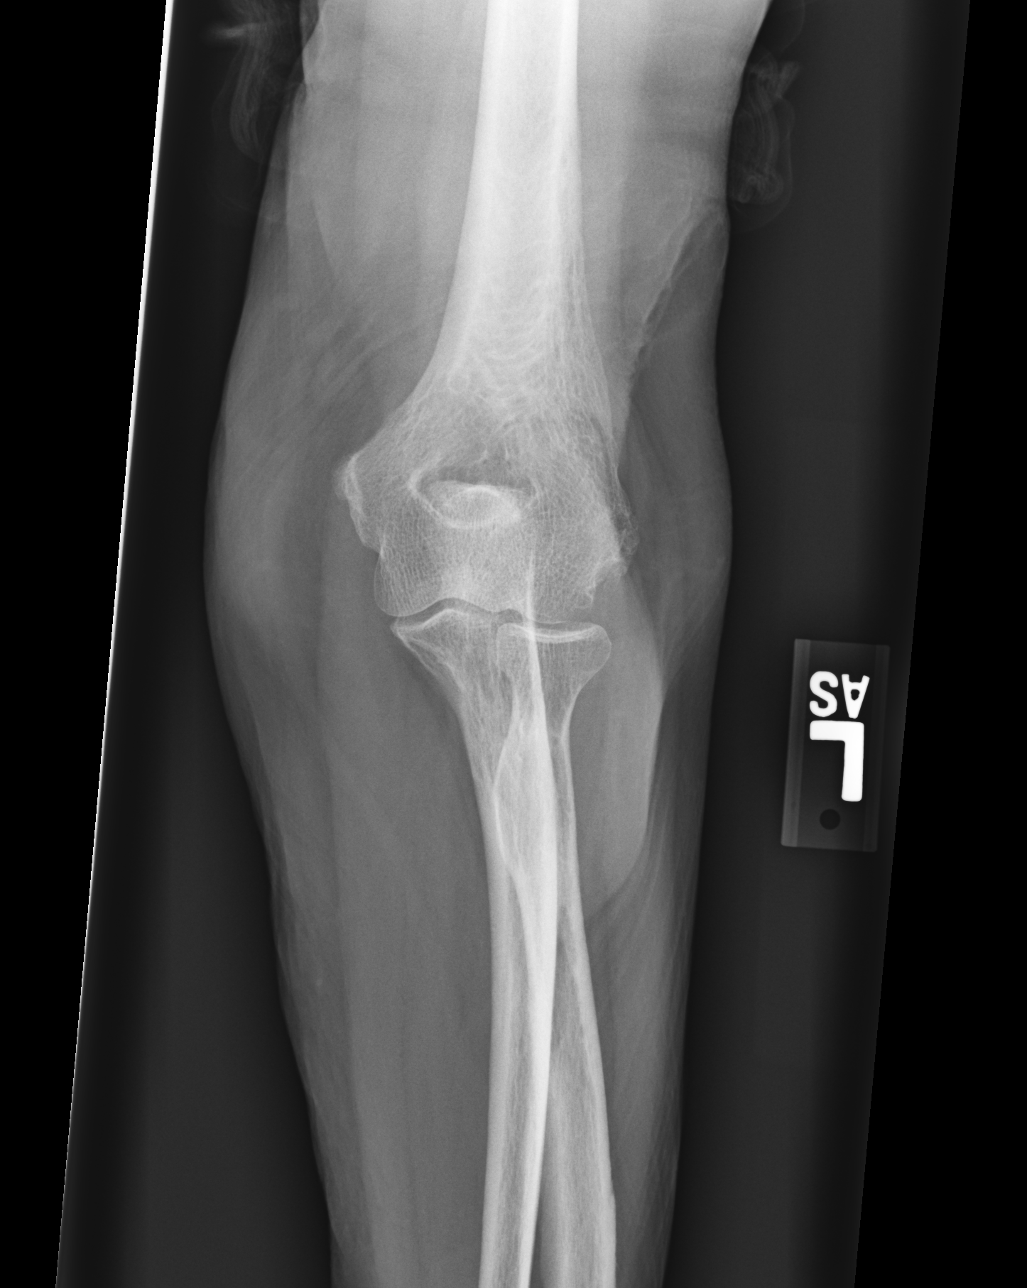

[dg elbow complete left (3+view) (2 of 4)]
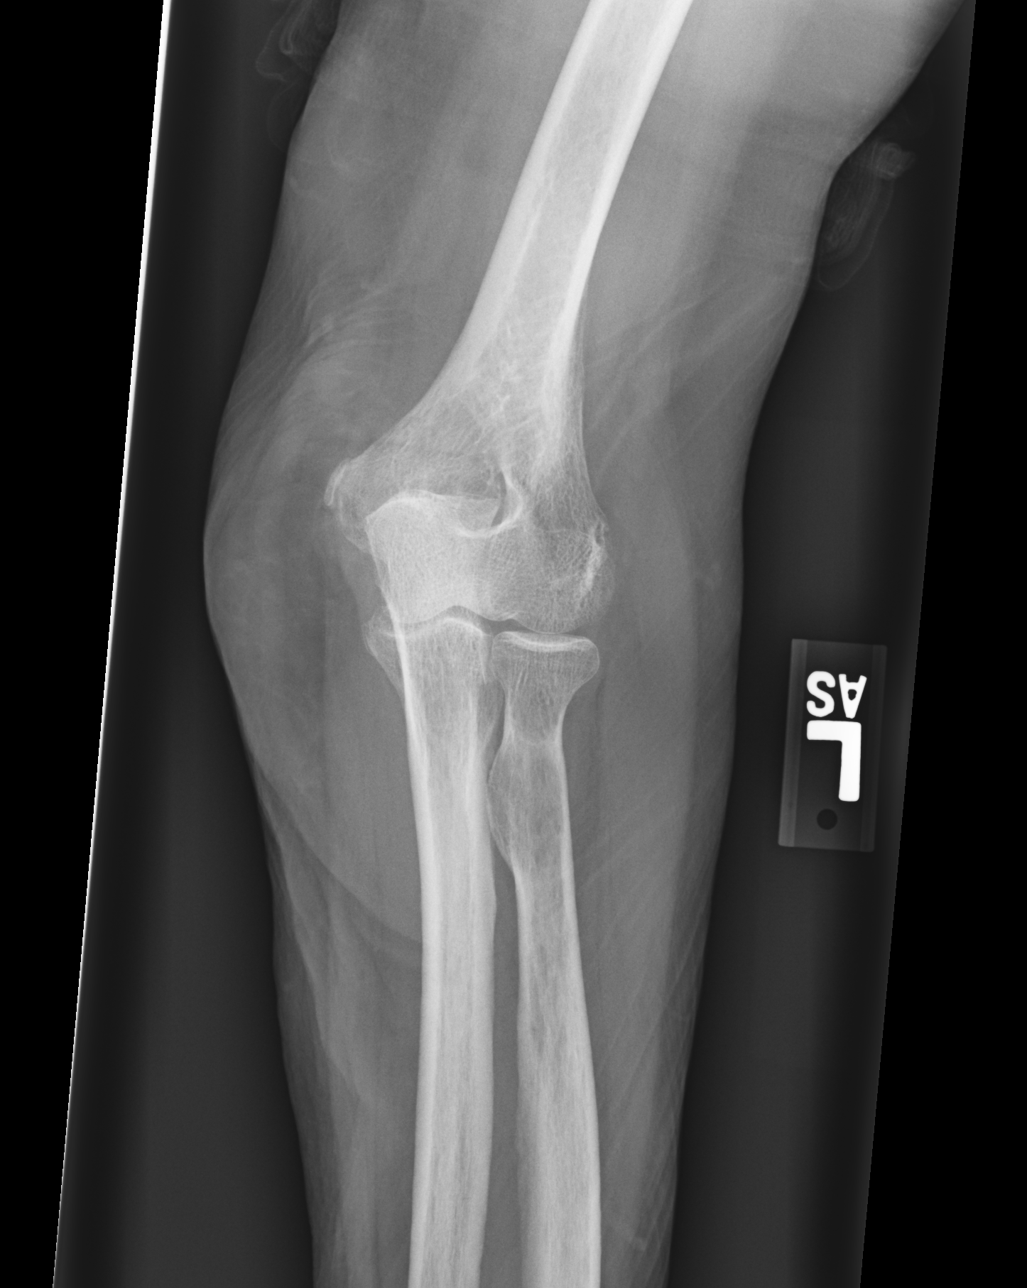

[dg elbow complete left (3+view) (3 of 4)]
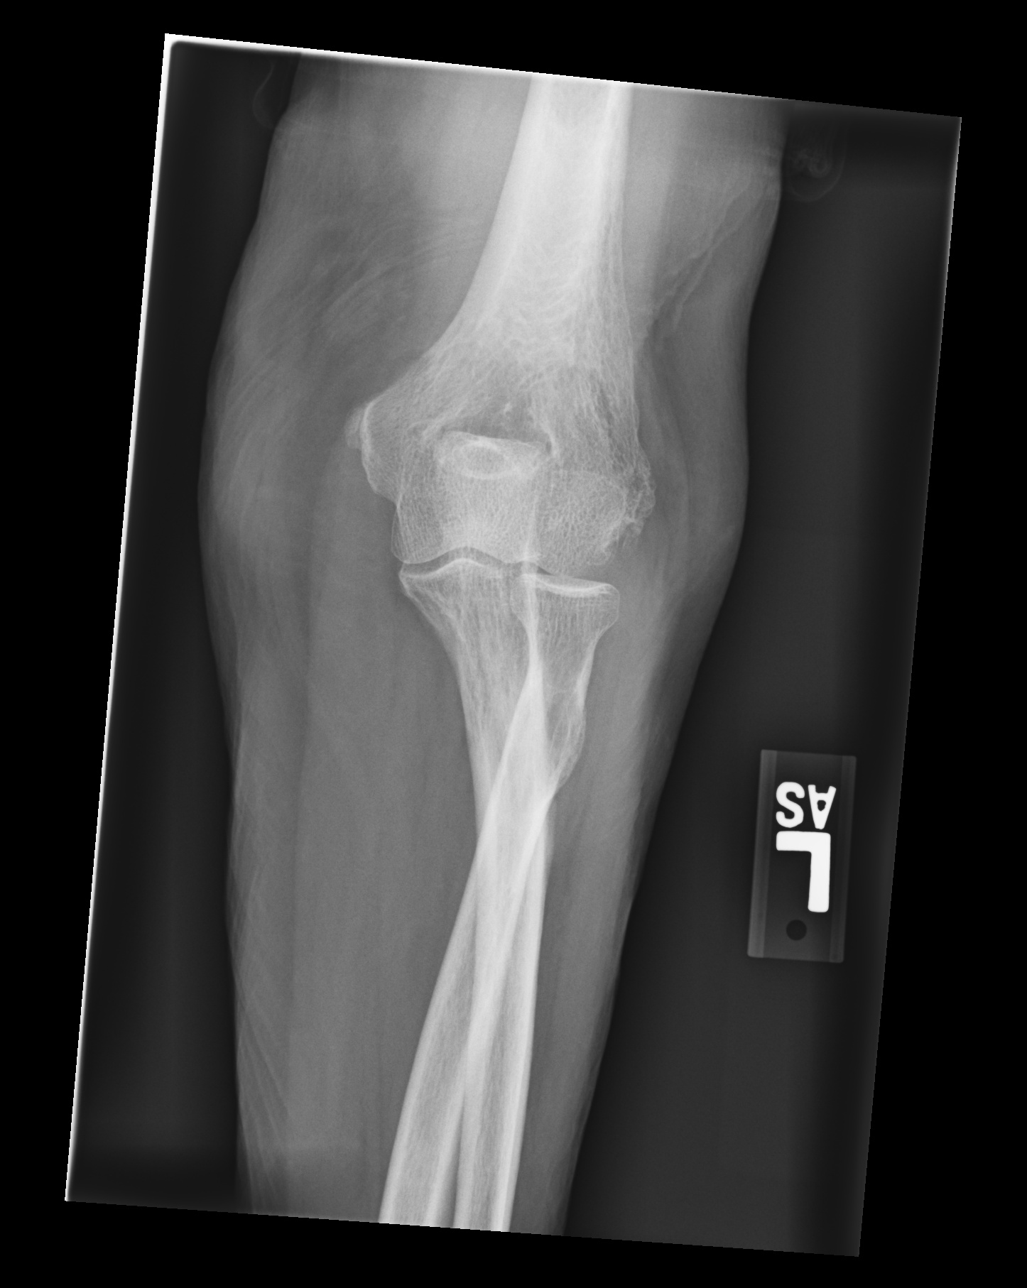

[dg elbow complete left (3+view) (4 of 4)]
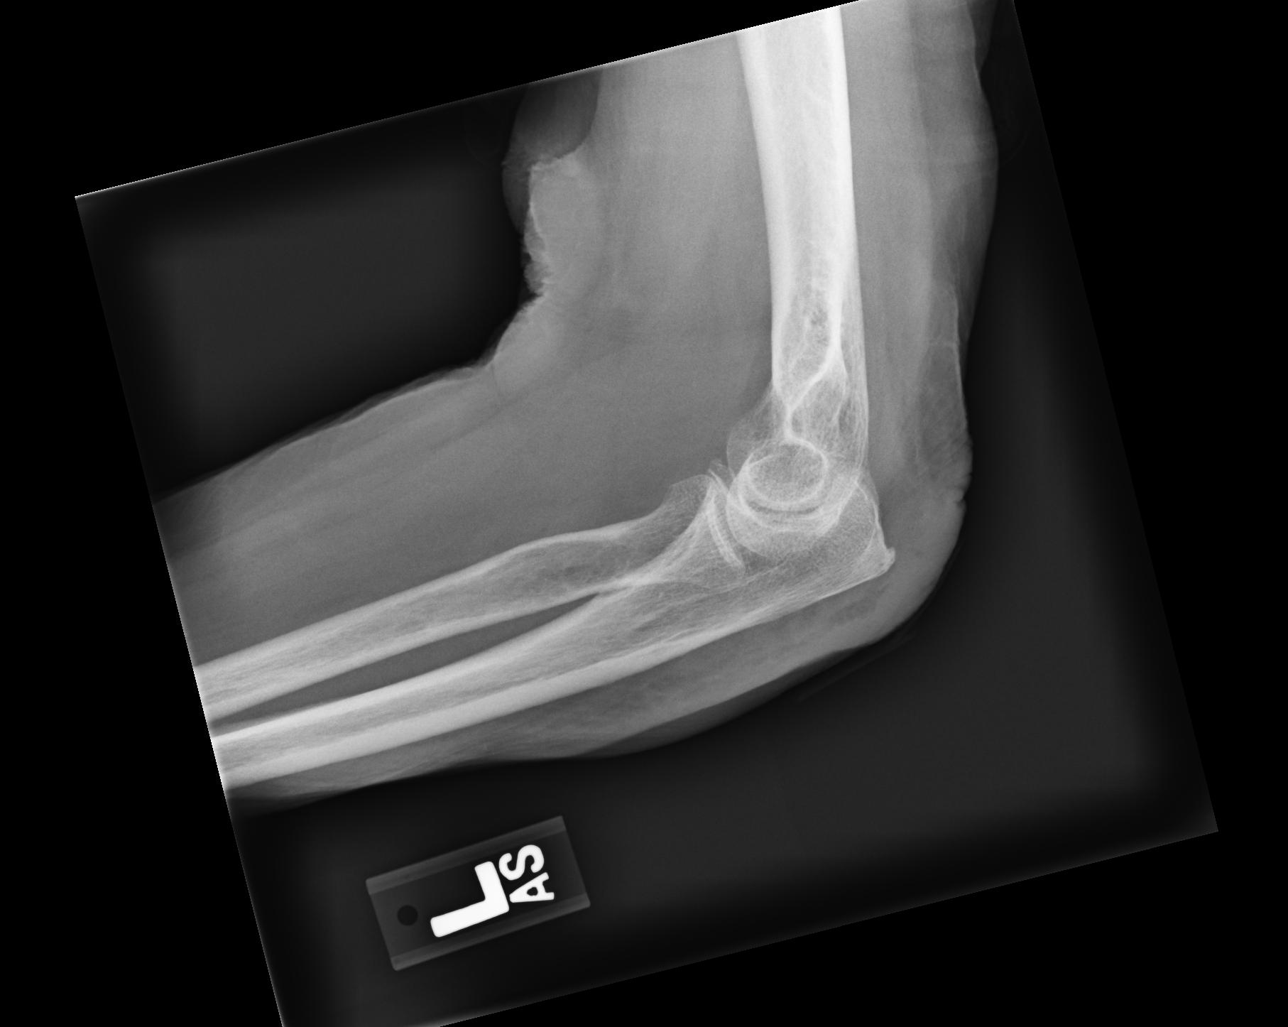

[4 of 4 positions shown; findings below may reference images not displayed]

FINDINGS: Generalized osteopenia. No acute fracture or dislocation. No joint
effusion. No aggressive osseous lesion. Soft tissue swelling over
the olecranon.
IMPRESSION: 1.  No acute osseous injury of the left elbow.

## 2020-11-09 NOTE — Progress Notes (Deleted)
HPI: FU CAD. Cardiac cath 5/19 showed 25 RCA and 25 LAD; EF 25-35 with apical hypokinesis felt c/w takotsubo CM. FU echo 7/19 showed EF 50-55, grade 1 DD. Since last seen,   Current Outpatient Medications  Medication Sig Dispense Refill  . acetaminophen (TYLENOL) 500 MG tablet Take 2 tablets (1,000 mg total) by mouth every 6 (six) hours. (Patient taking differently: Take 1,000 mg by mouth every 6 (six) hours as needed for moderate pain. ) 30 tablet 0  . albuterol (PROVENTIL HFA;VENTOLIN HFA) 108 (90 BASE) MCG/ACT inhaler Inhale 2 puffs into the lungs every 6 (six) hours as needed. (Patient taking differently: Inhale 2 puffs into the lungs every 4 (four) hours as needed for wheezing or shortness of breath. ) 18 g 1  . albuterol (PROVENTIL) (2.5 MG/3ML) 0.083% nebulizer solution Take 3 mLs (2.5 mg total) by nebulization every 2 (two) hours as needed for wheezing or shortness of breath. 75 mL 12  . aluminum-magnesium hydroxide-simethicone (MAALOX) 627-035-00 MG/5ML SUSP Take 30 mLs by mouth 3 (three) times daily as needed (indigestion).     Marland Kitchen amLODipine (NORVASC) 5 MG tablet Take 5 mg by mouth daily.    Marland Kitchen aspirin EC 325 MG EC tablet Take 1 tablet (325 mg total) by mouth 2 (two) times daily. (Patient taking differently: Take 162.5 mg by mouth 2 (two) times daily. ) 30 tablet 0  . atorvastatin (LIPITOR) 20 MG tablet Take 20 mg by mouth every evening.     . cephALEXin (KEFLEX) 500 MG capsule Take 1 capsule (500 mg total) by mouth 4 (four) times daily. 28 capsule 0  . diazepam (VALIUM) 5 MG tablet Take 0.5 tablets (2.5 mg total) by mouth every 8 (eight) hours as needed for anxiety. Must last 30 days. 6 tablet 0  . ferrous sulfate 325 (65 FE) MG tablet Take 1 tablet (325 mg total) by mouth daily with breakfast. 30 tablet 0  . furosemide (LASIX) 20 MG tablet Take 20 mg by mouth every Monday, Wednesday, and Friday. Monday and Wednesday    . gabapentin (NEURONTIN) 400 MG capsule Take 1 capsule (400 mg  total) by mouth 4 (four) times daily. (Patient taking differently: Take 400 mg by mouth 3 (three) times daily. ) 120 capsule 2  . meclizine (ANTIVERT) 25 MG tablet Take 1 tablet (25 mg total) by mouth 3 (three) times daily as needed for dizziness. 30 tablet 0  . meloxicam (MOBIC) 7.5 MG tablet Take 7.5 mg by mouth daily.    . metoCLOPramide (REGLAN) 5 MG tablet Take 5 mg by mouth 2 (two) times daily.     . metoprolol tartrate (LOPRESSOR) 25 MG tablet Take 0.5 tablets (12.5 mg total) by mouth 2 (two) times daily. 60 tablet 0  . mirtazapine (REMERON) 7.5 MG tablet Take 7.5 mg by mouth at bedtime.    . Multiple Vitamin (MULTIVITAMIN WITH MINERALS) TABS tablet Take 1 tablet by mouth daily. 30 tablet 0  . oxybutynin (DITROPAN-XL) 5 MG 24 hr tablet Take 5 mg by mouth at bedtime.    . pantoprazole (PROTONIX) 40 MG tablet Take 1 tablet (40 mg total) by mouth daily at 6 (six) AM. 30 tablet 0  . raloxifene (EVISTA) 60 MG tablet Take 60 mg by mouth daily.    . sertraline (ZOLOFT) 100 MG tablet Take 100 mg by mouth in the morning and at bedtime.     Marland Kitchen tiZANidine (ZANAFLEX) 2 MG tablet Take 1 tablet (2 mg total) by mouth  every 6 (six) hours as needed. (Patient taking differently: Take 2 mg by mouth every 6 (six) hours as needed for muscle spasms. ) 50 tablet 0  . traMADol (ULTRAM) 50 MG tablet Take 1 tablet (50 mg total) by mouth 3 (three) times daily as needed for moderate pain. 6 tablet 0  . TRELEGY ELLIPTA 100-62.5-25 MCG/INH AEPB Inhale 1 puff into the lungs daily.    . Vitamin D, Ergocalciferol, (DRISDOL) 1.25 MG (50000 UNIT) CAPS capsule Take 1 capsule (50,000 Units total) by mouth every 7 (seven) days. 5 capsule 0   No current facility-administered medications for this visit.     Past Medical History:  Diagnosis Date  . Adenocarcinoma of right lung, stage 1 ([place]) 07/28/2016  . Anxiety   . Asthma   . Cancer ([place])    uterine  . Closed fracture of left distal radius 06/13/2020  . COPD (chronic  obstructive pulmonary disease) ([place])   . Coronary artery disease    mild nonobstructive by 2019 cath  . Cyst    left side of neck  . Depression   . GERD (gastroesophageal reflux disease)   . Greater trochanter fracture ([place]), left 06/11/2020  . Heart murmur   . Hyperlipidemia   . Hypertension   . Low back pain   . Osteoarthritis   . Osteoporosis   . Takotsubo cardiomyopathy    EF recovered to 50-55% by 01/23/18 echo  . Vitamin D insufficiency 06/13/2020    Past Surgical History:  Procedure Laterality Date  . ANKLE SURGERY Right    horse accident  . APPENDECTOMY    . BACK SURGERY    . CHOLECYSTECTOMY    . EYE SURGERY     lense implant  . FRACTURE SURGERY    . HIP SURGERY  01/2010   Left Hip   . INNER EAR SURGERY Bilateral 1970   related to severe ear infections  . KNEE SURGERY     Right knee  . LEFT HEART CATH AND CORONARY ANGIOGRAPHY N/A 12/11/2017   Procedure: LEFT HEART CATH AND CORONARY ANGIOGRAPHY;  Surgeon: Jettie Booze, MD;  Location: [facility];  Service: Cardiovascular;  Laterality: N/A;  . LOBECTOMY Right 05/09/2016   Procedure: RIGHT UPPER LOBECTOMY;  Surgeon: [name], MD;  Location: [facility];  Service: Thoracic;  Laterality: Right;  . MASS EXCISION  08/25/2011   Procedure: EXCISION MASS;  Surgeon: Harl Bowie, MD;  Location: [facility];  Service: General;  Laterality: N/A;  excision left neck mass  . OPEN REDUCTION INTERNAL FIXATION (ORIF) DISTAL RADIAL FRACTURE Left 06/12/2020   Procedure: OPEN REDUCTION INTERNAL FIXATION (ORIF) DISTAL RADIAL FRACTURE;  Surgeon: Shona Needles, MD;  Location: [facility];  Service: Orthopedics;  Laterality: Left;  . TOTAL ABDOMINAL HYSTERECTOMY    . TOTAL KNEE ARTHROPLASTY Right 05/27/2019   Procedure: TOTAL KNEE ARTHROPLASTY;  Surgeon: Vickey Huger, MD;  Location: [facility];  Service: Orthopedics;  Laterality: Right;  75 mins needed for length of case  . TOTAL KNEE ARTHROPLASTY Left 12/30/2019    Procedure: TOTAL KNEE ARTHROPLASTY;  Surgeon: Vickey Huger, MD;  Location: [facility];  Service: Orthopedics;  Laterality: Left;  Marland Kitchen VIDEO ASSISTED THORACOSCOPY (VATS)/WEDGE RESECTION Right 05/09/2016   Procedure: VIDEO ASSISTED THORACOSCOPY (VATS)/WEDGE RESECTION;  Surgeon: [name], MD;  Location: [facility];  Service: Thoracic;  Laterality: Right;    Social History   Socioeconomic History  . Marital status: Widowed    Spouse name: Not on  file  . Number of children: Not on file  . Years of education: Not on file  . Highest education level: Not on file  Occupational History  . Not on file  Tobacco Use  . Smoking status: Former Smoker    Packs/day: 2.00    Years: 51.00    Pack years: 102.00    Types: Cigarettes    Quit date: 05/04/2012    Years since quitting: 8.5  . Smokeless tobacco: Never Used  Vaping Use  . Vaping Use: Former  . Quit date: 12/01/2017  Substance and Sexual Activity  . Alcohol use: No    Alcohol/week: 0.0 standard drinks  . Drug use: No  . Sexual activity: Yes    Birth control/protection: Other-see comments  Other Topics Concern  . Not on file  Social History Narrative   Single, lives alone with 2 dogs   3 children   Retired: several careers included Regulatory affairs officer, diners, truck stops   Works now on the weekends at [employer]: Not on [employer] Insecurity: Not on file  Transportation Needs: Not on file  Physical Activity: Not on file  Stress: Not on file  Social Connections: Not on file  Intimate Partner Violence: Not on file    Family History  Problem Relation Age of Onset  . Heart disease Mother   . Heart disease Father   . Cancer Sister        #1, breast  . Heart disease Brother        had CABG    ROS: no fevers or chills, productive cough, hemoptysis, dysphasia, odynophagia, melena, hematochezia, dysuria, hematuria, rash, seizure activity, orthopnea, PND, pedal edema,  claudication. Remaining systems are negative.  Physical Exam: Well-developed well-nourished in no acute distress.  Skin is warm and dry.  HEENT is normal.  Neck is supple.  Chest is clear to auscultation with normal expansion.  Cardiovascular exam is regular rate and rhythm.  Abdominal exam nontender or distended. No masses palpated. Extremities show no edema. neuro grossly intact  ECG- personally reviewed  A/P  1 coronary artery disease-mild on previous catheterization.  Continue medical therapy with aspirin and statin.  Patient denies chest pain.  2 history of takotsubo cardiomyopathy-LV function has improved on most recent echocardiogram.  We will repeat.  3 hypertension-blood pressure controlled.  Continue present medications.  4 hyperlipidemia-continue statin.  Kirk Ruths, MD

## 2020-11-13 DIAGNOSIS — M79641 Pain in right hand: Secondary | ICD-10-CM | POA: Diagnosis not present

## 2020-11-16 ENCOUNTER — Ambulatory Visit: Payer: Medicare Other | Admitting: Cardiology

## 2020-12-09 DIAGNOSIS — D501 Sideropenic dysphagia: Secondary | ICD-10-CM | POA: Diagnosis not present

## 2020-12-09 DIAGNOSIS — K219 Gastro-esophageal reflux disease without esophagitis: Secondary | ICD-10-CM | POA: Diagnosis not present

## 2020-12-09 DIAGNOSIS — J441 Chronic obstructive pulmonary disease with (acute) exacerbation: Secondary | ICD-10-CM | POA: Diagnosis not present

## 2020-12-09 DIAGNOSIS — G8929 Other chronic pain: Secondary | ICD-10-CM | POA: Diagnosis not present

## 2020-12-09 DIAGNOSIS — I5020 Unspecified systolic (congestive) heart failure: Secondary | ICD-10-CM | POA: Diagnosis not present

## 2020-12-09 DIAGNOSIS — J449 Chronic obstructive pulmonary disease, unspecified: Secondary | ICD-10-CM | POA: Diagnosis not present

## 2020-12-09 DIAGNOSIS — E782 Mixed hyperlipidemia: Secondary | ICD-10-CM | POA: Diagnosis not present

## 2020-12-09 DIAGNOSIS — N183 Chronic kidney disease, stage 3 unspecified: Secondary | ICD-10-CM | POA: Diagnosis not present

## 2020-12-09 DIAGNOSIS — M810 Age-related osteoporosis without current pathological fracture: Secondary | ICD-10-CM | POA: Diagnosis not present

## 2020-12-09 DIAGNOSIS — I10 Essential (primary) hypertension: Secondary | ICD-10-CM | POA: Diagnosis not present

## 2021-01-08 DIAGNOSIS — N183 Chronic kidney disease, stage 3 unspecified: Secondary | ICD-10-CM | POA: Diagnosis not present

## 2021-01-08 DIAGNOSIS — J449 Chronic obstructive pulmonary disease, unspecified: Secondary | ICD-10-CM | POA: Diagnosis not present

## 2021-01-08 DIAGNOSIS — E782 Mixed hyperlipidemia: Secondary | ICD-10-CM | POA: Diagnosis not present

## 2021-01-08 DIAGNOSIS — I10 Essential (primary) hypertension: Secondary | ICD-10-CM | POA: Diagnosis not present

## 2021-01-08 DIAGNOSIS — D501 Sideropenic dysphagia: Secondary | ICD-10-CM | POA: Diagnosis not present

## 2021-01-08 DIAGNOSIS — G8929 Other chronic pain: Secondary | ICD-10-CM | POA: Diagnosis not present

## 2021-01-08 DIAGNOSIS — M1990 Unspecified osteoarthritis, unspecified site: Secondary | ICD-10-CM | POA: Diagnosis not present

## 2021-01-08 DIAGNOSIS — J441 Chronic obstructive pulmonary disease with (acute) exacerbation: Secondary | ICD-10-CM | POA: Diagnosis not present

## 2021-01-08 DIAGNOSIS — I5020 Unspecified systolic (congestive) heart failure: Secondary | ICD-10-CM | POA: Diagnosis not present

## 2021-01-08 DIAGNOSIS — M810 Age-related osteoporosis without current pathological fracture: Secondary | ICD-10-CM | POA: Diagnosis not present

## 2021-01-08 DIAGNOSIS — K219 Gastro-esophageal reflux disease without esophagitis: Secondary | ICD-10-CM | POA: Diagnosis not present

## 2021-01-27 ENCOUNTER — Other Ambulatory Visit: Payer: Self-pay

## 2021-01-27 ENCOUNTER — Other Ambulatory Visit: Payer: Self-pay | Admitting: Internal Medicine

## 2021-01-27 ENCOUNTER — Ambulatory Visit
Admission: RE | Admit: 2021-01-27 | Discharge: 2021-01-27 | Disposition: A | Discharge status: Home / Self Care | Payer: Medicare Other | Source: Ambulatory Visit | Attending: Internal Medicine | Admitting: Internal Medicine

## 2021-01-27 DIAGNOSIS — M25552 Pain in left hip: Secondary | ICD-10-CM | POA: Diagnosis not present

## 2021-01-27 DIAGNOSIS — M1612 Unilateral primary osteoarthritis, left hip: Secondary | ICD-10-CM | POA: Diagnosis not present

## 2021-01-27 DIAGNOSIS — S72002A Fracture of unspecified part of neck of left femur, initial encounter for closed fracture: Secondary | ICD-10-CM | POA: Diagnosis not present

## 2021-01-27 DIAGNOSIS — Z9889 Other specified postprocedural states: Secondary | ICD-10-CM | POA: Diagnosis not present

## 2021-01-27 IMAGING — CR DG HIP (WITH OR WITHOUT PELVIS) 2-3V*L*
2 series · 2 of 2 positions shown · non-contrast
Comparison: [DATE].

CLINICAL DATA: Left hip pain without known injury.

EXAM:
DG HIP (WITH OR WITHOUT PELVIS) 2-3V LEFT

[t hip ap left]
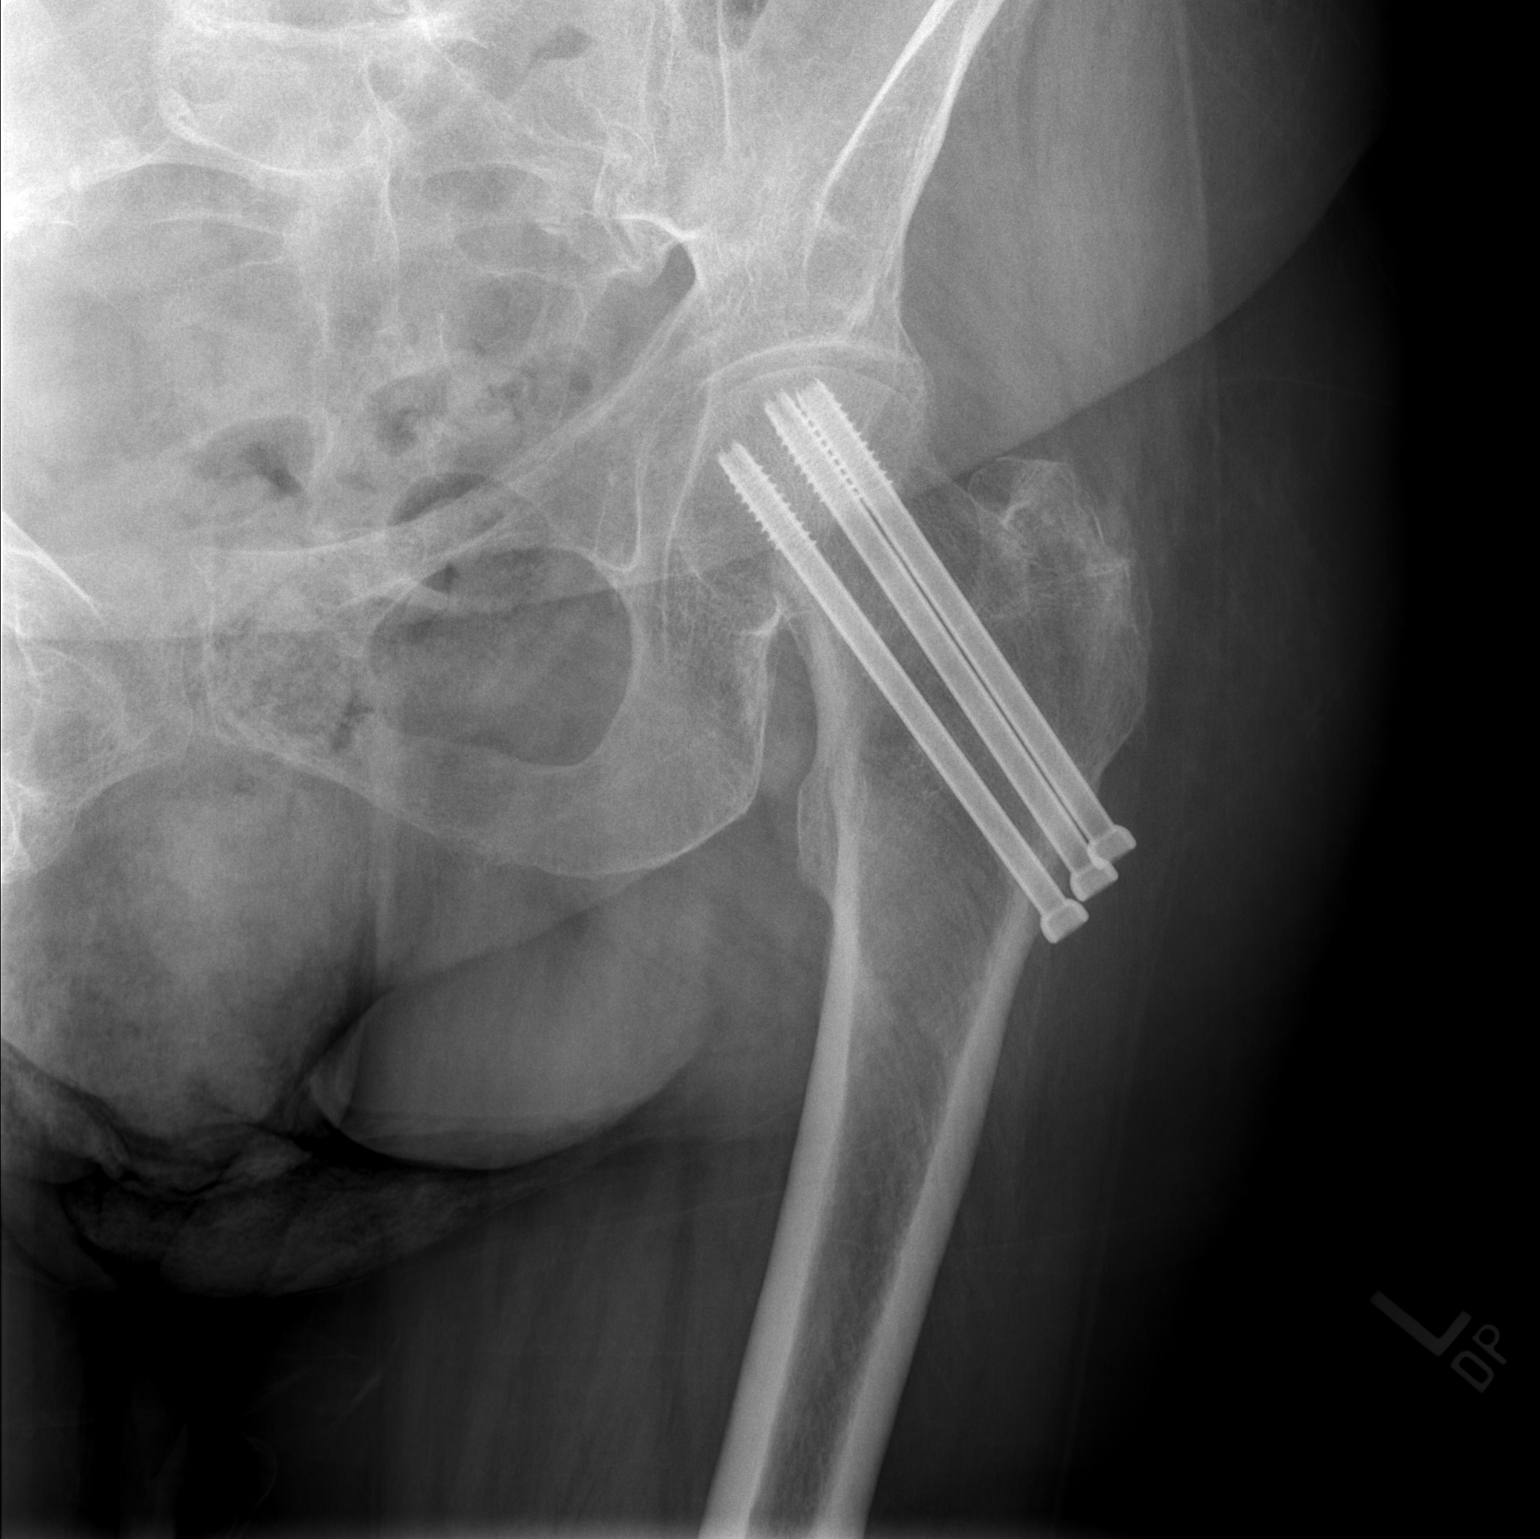

[t hip frog leg left]
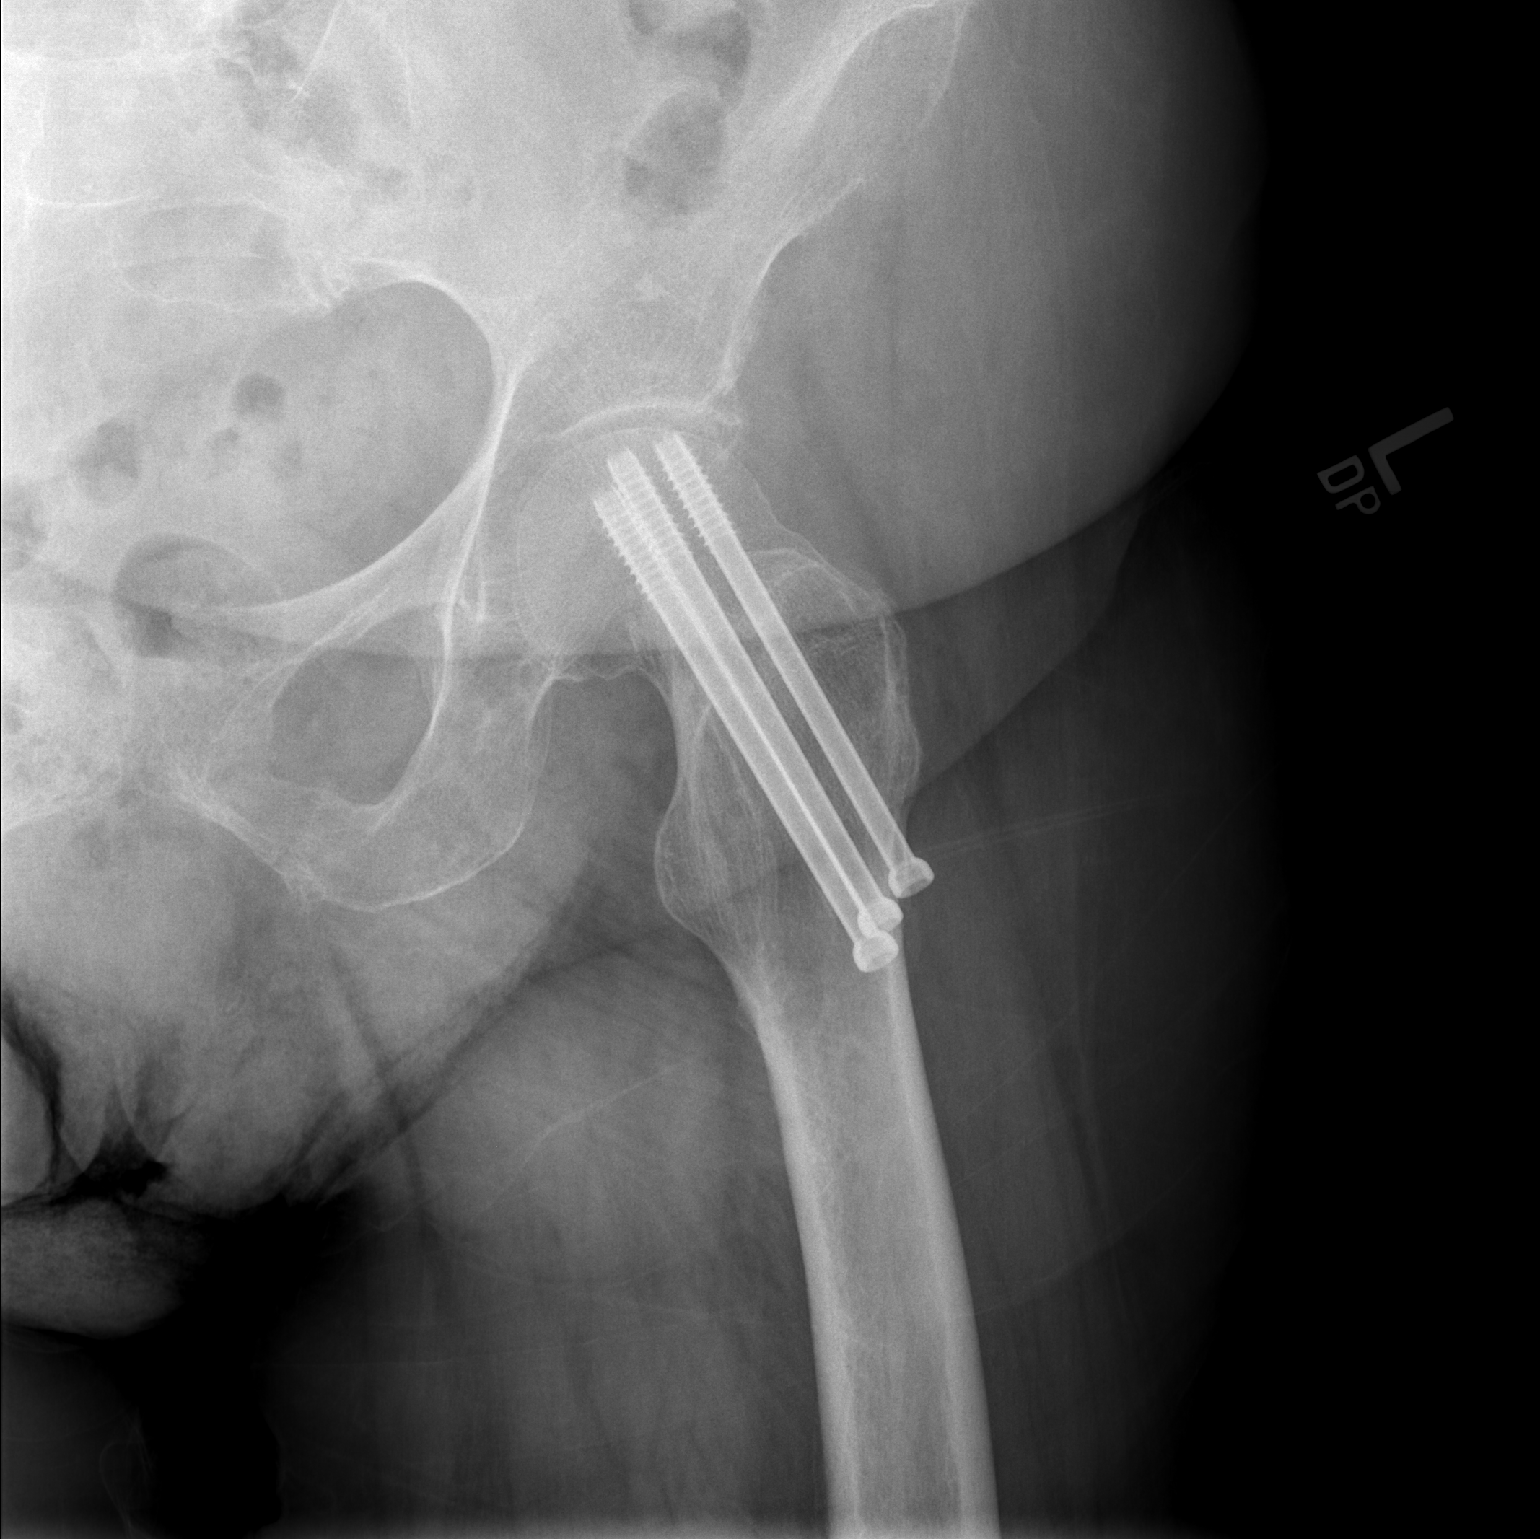

[2 of 2 positions shown; findings below may reference images not displayed]

FINDINGS: Status post surgical internal fixation of old proximal left femoral
neck fracture. No acute fracture or dislocation is noted. Mild
osteophyte formation is seen involving the left hip.
IMPRESSION: Postsurgical and degenerative changes as described above. No acute
abnormality is noted.

## 2021-02-08 DIAGNOSIS — N183 Chronic kidney disease, stage 3 unspecified: Secondary | ICD-10-CM | POA: Diagnosis not present

## 2021-02-08 DIAGNOSIS — J449 Chronic obstructive pulmonary disease, unspecified: Secondary | ICD-10-CM | POA: Diagnosis not present

## 2021-02-08 DIAGNOSIS — I5020 Unspecified systolic (congestive) heart failure: Secondary | ICD-10-CM | POA: Diagnosis not present

## 2021-02-08 DIAGNOSIS — I10 Essential (primary) hypertension: Secondary | ICD-10-CM | POA: Diagnosis not present

## 2021-02-08 DIAGNOSIS — M1990 Unspecified osteoarthritis, unspecified site: Secondary | ICD-10-CM | POA: Diagnosis not present

## 2021-02-08 DIAGNOSIS — C3491 Malignant neoplasm of unspecified part of right bronchus or lung: Secondary | ICD-10-CM | POA: Diagnosis not present

## 2021-02-08 DIAGNOSIS — K219 Gastro-esophageal reflux disease without esophagitis: Secondary | ICD-10-CM | POA: Diagnosis not present

## 2021-02-08 DIAGNOSIS — M810 Age-related osteoporosis without current pathological fracture: Secondary | ICD-10-CM | POA: Diagnosis not present

## 2021-02-08 DIAGNOSIS — E782 Mixed hyperlipidemia: Secondary | ICD-10-CM | POA: Diagnosis not present

## 2021-02-08 DIAGNOSIS — G8929 Other chronic pain: Secondary | ICD-10-CM | POA: Diagnosis not present

## 2021-03-08 DIAGNOSIS — I5020 Unspecified systolic (congestive) heart failure: Secondary | ICD-10-CM | POA: Diagnosis not present

## 2021-03-08 DIAGNOSIS — G8929 Other chronic pain: Secondary | ICD-10-CM | POA: Diagnosis not present

## 2021-03-08 DIAGNOSIS — K219 Gastro-esophageal reflux disease without esophagitis: Secondary | ICD-10-CM | POA: Diagnosis not present

## 2021-03-08 DIAGNOSIS — D501 Sideropenic dysphagia: Secondary | ICD-10-CM | POA: Diagnosis not present

## 2021-03-08 DIAGNOSIS — J449 Chronic obstructive pulmonary disease, unspecified: Secondary | ICD-10-CM | POA: Diagnosis not present

## 2021-03-08 DIAGNOSIS — I10 Essential (primary) hypertension: Secondary | ICD-10-CM | POA: Diagnosis not present

## 2021-03-08 DIAGNOSIS — E782 Mixed hyperlipidemia: Secondary | ICD-10-CM | POA: Diagnosis not present

## 2021-03-08 DIAGNOSIS — C3491 Malignant neoplasm of unspecified part of right bronchus or lung: Secondary | ICD-10-CM | POA: Diagnosis not present

## 2021-03-08 DIAGNOSIS — N183 Chronic kidney disease, stage 3 unspecified: Secondary | ICD-10-CM | POA: Diagnosis not present

## 2021-03-08 DIAGNOSIS — M810 Age-related osteoporosis without current pathological fracture: Secondary | ICD-10-CM | POA: Diagnosis not present

## 2021-03-08 DIAGNOSIS — M1990 Unspecified osteoarthritis, unspecified site: Secondary | ICD-10-CM | POA: Diagnosis not present

## 2021-04-06 DIAGNOSIS — J441 Chronic obstructive pulmonary disease with (acute) exacerbation: Secondary | ICD-10-CM | POA: Diagnosis not present

## 2021-04-06 DIAGNOSIS — K219 Gastro-esophageal reflux disease without esophagitis: Secondary | ICD-10-CM | POA: Diagnosis not present

## 2021-04-06 DIAGNOSIS — G8929 Other chronic pain: Secondary | ICD-10-CM | POA: Diagnosis not present

## 2021-04-06 DIAGNOSIS — N183 Chronic kidney disease, stage 3 unspecified: Secondary | ICD-10-CM | POA: Diagnosis not present

## 2021-04-06 DIAGNOSIS — E782 Mixed hyperlipidemia: Secondary | ICD-10-CM | POA: Diagnosis not present

## 2021-04-06 DIAGNOSIS — M810 Age-related osteoporosis without current pathological fracture: Secondary | ICD-10-CM | POA: Diagnosis not present

## 2021-04-06 DIAGNOSIS — J449 Chronic obstructive pulmonary disease, unspecified: Secondary | ICD-10-CM | POA: Diagnosis not present

## 2021-04-06 DIAGNOSIS — M1990 Unspecified osteoarthritis, unspecified site: Secondary | ICD-10-CM | POA: Diagnosis not present

## 2021-04-06 DIAGNOSIS — I10 Essential (primary) hypertension: Secondary | ICD-10-CM | POA: Diagnosis not present

## 2021-04-15 DIAGNOSIS — H52203 Unspecified astigmatism, bilateral: Secondary | ICD-10-CM | POA: Diagnosis not present

## 2021-04-15 DIAGNOSIS — H353111 Nonexudative age-related macular degeneration, right eye, early dry stage: Secondary | ICD-10-CM | POA: Diagnosis not present

## 2021-04-15 DIAGNOSIS — H31012 Macula scars of posterior pole (postinflammatory) (post-traumatic), left eye: Secondary | ICD-10-CM | POA: Diagnosis not present

## 2021-04-15 DIAGNOSIS — H5203 Hypermetropia, bilateral: Secondary | ICD-10-CM | POA: Diagnosis not present

## 2021-04-15 DIAGNOSIS — H524 Presbyopia: Secondary | ICD-10-CM | POA: Diagnosis not present

## 2021-04-16 DIAGNOSIS — H5213 Myopia, bilateral: Secondary | ICD-10-CM | POA: Diagnosis not present

## 2021-05-05 DIAGNOSIS — K219 Gastro-esophageal reflux disease without esophagitis: Secondary | ICD-10-CM | POA: Diagnosis not present

## 2021-05-05 DIAGNOSIS — I10 Essential (primary) hypertension: Secondary | ICD-10-CM | POA: Diagnosis not present

## 2021-05-05 DIAGNOSIS — J449 Chronic obstructive pulmonary disease, unspecified: Secondary | ICD-10-CM | POA: Diagnosis not present

## 2021-05-05 DIAGNOSIS — M810 Age-related osteoporosis without current pathological fracture: Secondary | ICD-10-CM | POA: Diagnosis not present

## 2021-05-05 DIAGNOSIS — M1990 Unspecified osteoarthritis, unspecified site: Secondary | ICD-10-CM | POA: Diagnosis not present

## 2021-05-05 DIAGNOSIS — E782 Mixed hyperlipidemia: Secondary | ICD-10-CM | POA: Diagnosis not present

## 2021-05-05 DIAGNOSIS — N183 Chronic kidney disease, stage 3 unspecified: Secondary | ICD-10-CM | POA: Diagnosis not present

## 2021-05-05 DIAGNOSIS — G8929 Other chronic pain: Secondary | ICD-10-CM | POA: Diagnosis not present

## 2021-05-05 DIAGNOSIS — J441 Chronic obstructive pulmonary disease with (acute) exacerbation: Secondary | ICD-10-CM | POA: Diagnosis not present

## 2021-05-11 ENCOUNTER — Other Ambulatory Visit: Payer: Self-pay

## 2021-05-11 ENCOUNTER — Emergency Department (HOSPITAL_COMMUNITY): Payer: Medicare Other

## 2021-05-11 ENCOUNTER — Observation Stay (HOSPITAL_COMMUNITY)
Admission: EM | Admit: 2021-05-11 | Discharge: 2021-05-13 | Disposition: A | Discharge status: Home / Self Care | Payer: Medicare Other | Attending: Emergency Medicine | Admitting: Emergency Medicine

## 2021-05-11 ENCOUNTER — Encounter (HOSPITAL_COMMUNITY): Payer: Self-pay | Admitting: Internal Medicine

## 2021-05-11 DIAGNOSIS — R404 Transient alteration of awareness: Secondary | ICD-10-CM | POA: Diagnosis not present

## 2021-05-11 DIAGNOSIS — Z20822 Contact with and (suspected) exposure to covid-19: Secondary | ICD-10-CM | POA: Insufficient documentation

## 2021-05-11 DIAGNOSIS — Z96653 Presence of artificial knee joint, bilateral: Secondary | ICD-10-CM | POA: Diagnosis not present

## 2021-05-11 DIAGNOSIS — Z8542 Personal history of malignant neoplasm of other parts of uterus: Secondary | ICD-10-CM | POA: Insufficient documentation

## 2021-05-11 DIAGNOSIS — J449 Chronic obstructive pulmonary disease, unspecified: Secondary | ICD-10-CM | POA: Diagnosis present

## 2021-05-11 DIAGNOSIS — I10 Essential (primary) hypertension: Secondary | ICD-10-CM | POA: Diagnosis present

## 2021-05-11 DIAGNOSIS — R4182 Altered mental status, unspecified: Secondary | ICD-10-CM | POA: Diagnosis present

## 2021-05-11 DIAGNOSIS — J45909 Unspecified asthma, uncomplicated: Secondary | ICD-10-CM | POA: Diagnosis not present

## 2021-05-11 DIAGNOSIS — J439 Emphysema, unspecified: Secondary | ICD-10-CM | POA: Diagnosis not present

## 2021-05-11 DIAGNOSIS — I129 Hypertensive chronic kidney disease with stage 1 through stage 4 chronic kidney disease, or unspecified chronic kidney disease: Secondary | ICD-10-CM | POA: Diagnosis not present

## 2021-05-11 DIAGNOSIS — N183 Chronic kidney disease, stage 3 unspecified: Secondary | ICD-10-CM | POA: Diagnosis not present

## 2021-05-11 DIAGNOSIS — Z7982 Long term (current) use of aspirin: Secondary | ICD-10-CM | POA: Diagnosis not present

## 2021-05-11 DIAGNOSIS — Z79899 Other long term (current) drug therapy: Secondary | ICD-10-CM | POA: Insufficient documentation

## 2021-05-11 DIAGNOSIS — R001 Bradycardia, unspecified: Secondary | ICD-10-CM

## 2021-05-11 DIAGNOSIS — Y900 Blood alcohol level of less than 20 mg/100 ml: Secondary | ICD-10-CM | POA: Diagnosis not present

## 2021-05-11 DIAGNOSIS — Z743 Need for continuous supervision: Secondary | ICD-10-CM | POA: Diagnosis not present

## 2021-05-11 DIAGNOSIS — Z87891 Personal history of nicotine dependence: Secondary | ICD-10-CM | POA: Diagnosis not present

## 2021-05-11 DIAGNOSIS — I499 Cardiac arrhythmia, unspecified: Secondary | ICD-10-CM | POA: Diagnosis not present

## 2021-05-11 DIAGNOSIS — D649 Anemia, unspecified: Secondary | ICD-10-CM | POA: Diagnosis not present

## 2021-05-11 DIAGNOSIS — N3000 Acute cystitis without hematuria: Principal | ICD-10-CM | POA: Insufficient documentation

## 2021-05-11 DIAGNOSIS — Z85118 Personal history of other malignant neoplasm of bronchus and lung: Secondary | ICD-10-CM | POA: Insufficient documentation

## 2021-05-11 DIAGNOSIS — R6889 Other general symptoms and signs: Secondary | ICD-10-CM | POA: Diagnosis not present

## 2021-05-11 DIAGNOSIS — N3090 Cystitis, unspecified without hematuria: Secondary | ICD-10-CM | POA: Diagnosis present

## 2021-05-11 DIAGNOSIS — I2510 Atherosclerotic heart disease of native coronary artery without angina pectoris: Secondary | ICD-10-CM | POA: Diagnosis not present

## 2021-05-11 DIAGNOSIS — R410 Disorientation, unspecified: Secondary | ICD-10-CM | POA: Diagnosis not present

## 2021-05-11 DIAGNOSIS — I517 Cardiomegaly: Secondary | ICD-10-CM | POA: Diagnosis not present

## 2021-05-11 HISTORY — DX: Non-ST elevation (NSTEMI) myocardial infarction: I21.4

## 2021-05-11 LAB — POC OCCULT BLOOD, ED: Fecal Occult Bld: NEGATIVE

## 2021-05-11 LAB — CBC WITH DIFFERENTIAL/PLATELET
Abs Immature Granulocytes: 0.02 10*3/uL (ref 0.00–0.07)
Basophils Absolute: 0 10*3/uL (ref 0.0–0.1)
Basophils Relative: 1 %
Eosinophils Absolute: 0.2 10*3/uL (ref 0.0–0.5)
Eosinophils Relative: 3 %
HCT: 26.6 % — ABNORMAL LOW (ref 36.0–46.0)
Hemoglobin: 7.6 g/dL — ABNORMAL LOW (ref 12.0–15.0)
Immature Granulocytes: 0 %
Lymphocytes Relative: 23 %
Lymphs Abs: 1.4 10*3/uL (ref 0.7–4.0)
MCH: 23.9 pg — ABNORMAL LOW (ref 26.0–34.0)
MCHC: 28.6 g/dL — ABNORMAL LOW (ref 30.0–36.0)
MCV: 83.6 fL (ref 80.0–100.0)
Monocytes Absolute: 0.5 10*3/uL (ref 0.1–1.0)
Monocytes Relative: 9 %
Neutro Abs: 3.8 10*3/uL (ref 1.7–7.7)
Neutrophils Relative %: 64 %
Platelets: 275 10*3/uL (ref 150–400)
RBC: 3.18 MIL/uL — ABNORMAL LOW (ref 3.87–5.11)
RDW: 19.7 % — ABNORMAL HIGH (ref 11.5–15.5)
WBC: 5.8 10*3/uL (ref 4.0–10.5)
nRBC: 0 % (ref 0.0–0.2)

## 2021-05-11 LAB — URINALYSIS, COMPLETE (UACMP) WITH MICROSCOPIC
Bilirubin Urine: NEGATIVE
Glucose, UA: NEGATIVE mg/dL
Hgb urine dipstick: NEGATIVE
Ketones, ur: NEGATIVE mg/dL
Nitrite: POSITIVE — AB
Protein, ur: NEGATIVE mg/dL
Specific Gravity, Urine: 1.004 — ABNORMAL LOW (ref 1.005–1.030)
pH: 7 (ref 5.0–8.0)

## 2021-05-11 LAB — COMPREHENSIVE METABOLIC PANEL
ALT: 11 U/L (ref 0–44)
AST: 18 U/L (ref 15–41)
Albumin: 2.6 g/dL — ABNORMAL LOW (ref 3.5–5.0)
Alkaline Phosphatase: 59 U/L (ref 38–126)
Anion gap: 7 (ref 5–15)
BUN: 13 mg/dL (ref 8–23)
CO2: 25 mmol/L (ref 22–32)
Calcium: 8.1 mg/dL — ABNORMAL LOW (ref 8.9–10.3)
Chloride: 105 mmol/L (ref 98–111)
Creatinine, Ser: 1.26 mg/dL — ABNORMAL HIGH (ref 0.44–1.00)
GFR, Estimated: 44 mL/min — ABNORMAL LOW (ref >60)
Glucose, Bld: 126 mg/dL — ABNORMAL HIGH (ref 70–99)
Potassium: 4.4 mmol/L (ref 3.5–5.1)
Sodium: 137 mmol/L (ref 135–145)
Total Bilirubin: 0.4 mg/dL (ref 0.3–1.2)
Total Protein: 6 g/dL — ABNORMAL LOW (ref 6.5–8.1)

## 2021-05-11 LAB — CBG MONITORING, ED: Glucose-Capillary: 120 mg/dL — ABNORMAL HIGH (ref 70–99)

## 2021-05-11 LAB — RESP PANEL BY RT-PCR (FLU A&B, COVID) ARPGX2
Influenza A by PCR: NEGATIVE
Influenza B by PCR: NEGATIVE
SARS Coronavirus 2 by RT PCR: NEGATIVE

## 2021-05-11 LAB — IRON AND TIBC
Iron: 18 ug/dL — ABNORMAL LOW (ref 28–170)
Saturation Ratios: 5 % — ABNORMAL LOW (ref 10.4–31.8)
TIBC: 342 ug/dL (ref 250–450)
UIBC: 324 ug/dL

## 2021-05-11 LAB — AMMONIA: Ammonia: <10 umol/L (ref 9–35)

## 2021-05-11 LAB — ETHANOL: Alcohol, Ethyl (B): <10 mg/dL (ref <10)

## 2021-05-11 IMAGING — CT CT HEAD W/O CM
4 series · 17 of 47 positions shown, 19 images · non-contrast
Comparison: CT and MRI studies from [facility] of this year.

CLINICAL DATA: Mental status changes of unknown cause.

EXAM:
CT HEAD WITHOUT CONTRAST
TECHNIQUE: Contiguous axial images were obtained from the base of the skull
through the vertex without intravenous contrast.

[Series 2: head without · axial · non-contrast · 0.39mm/px · z∈[-124,-4]mm · 7 of 33 slices shown, 9 images]
[im 5/33  brain]
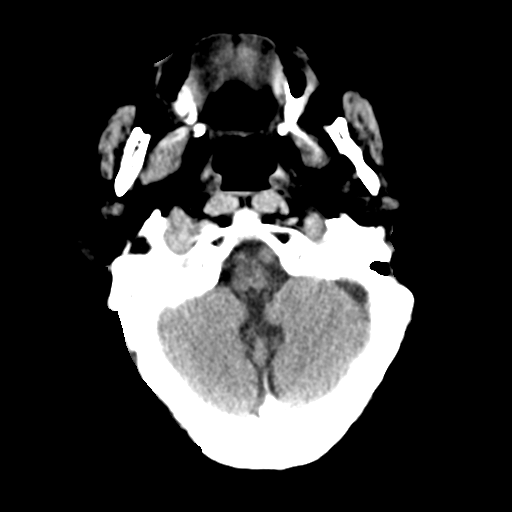
[im 5/33  bone]
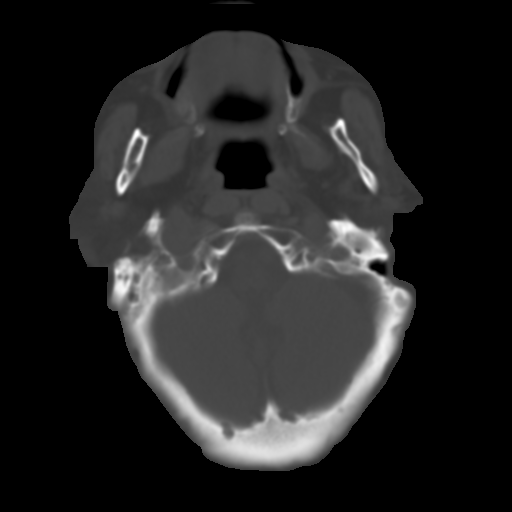
[im 9/33  brain]
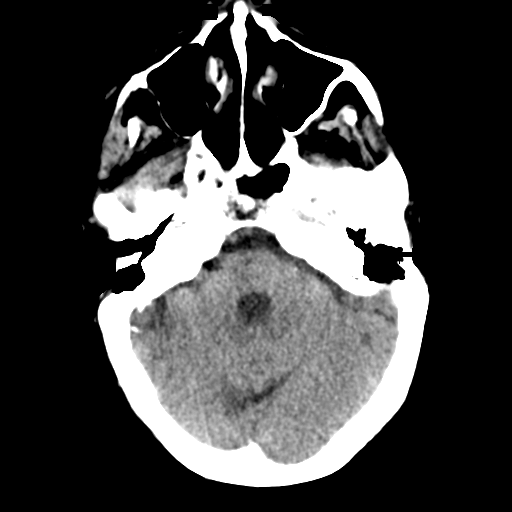
[im 13/33  brain]
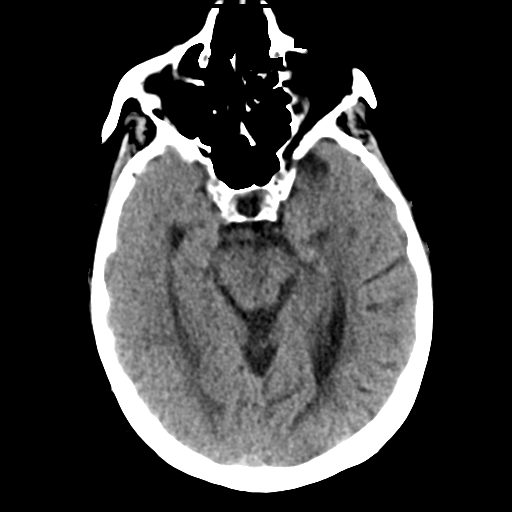
[im 17/33  brain]
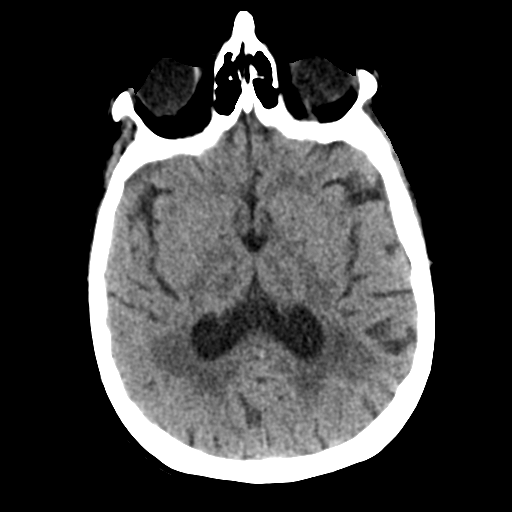
[im 21/33  brain]
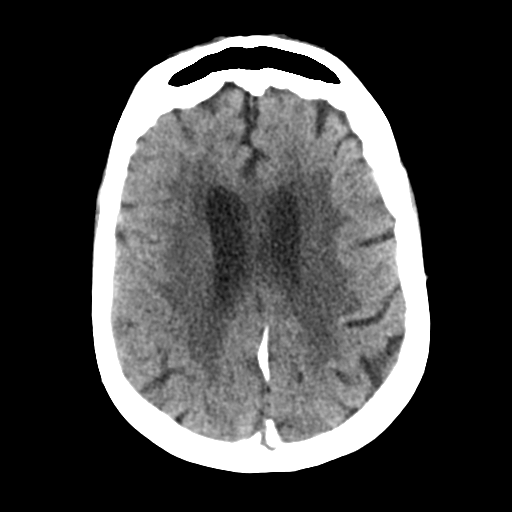
[im 21/33  bone]
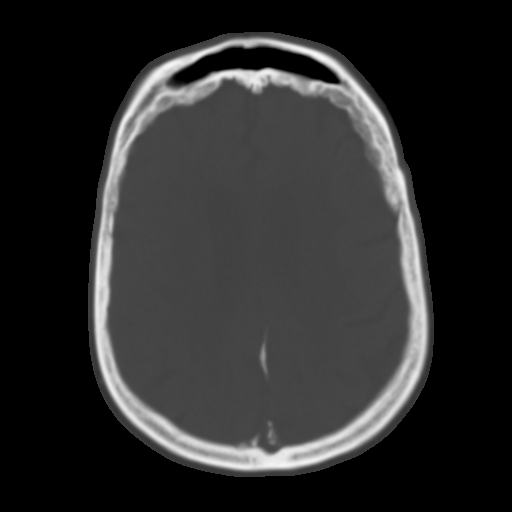
[im 25/33  brain]
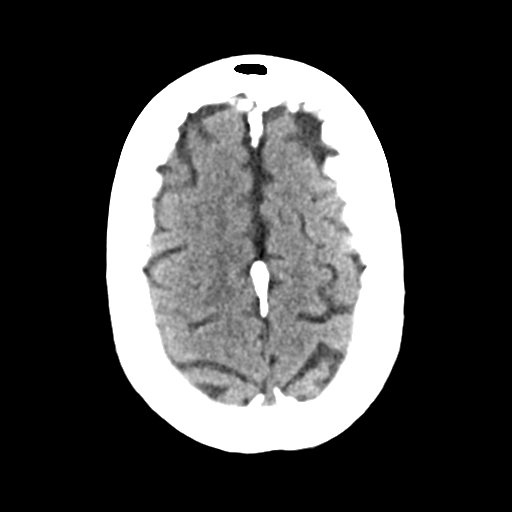
[im 29/33  brain]
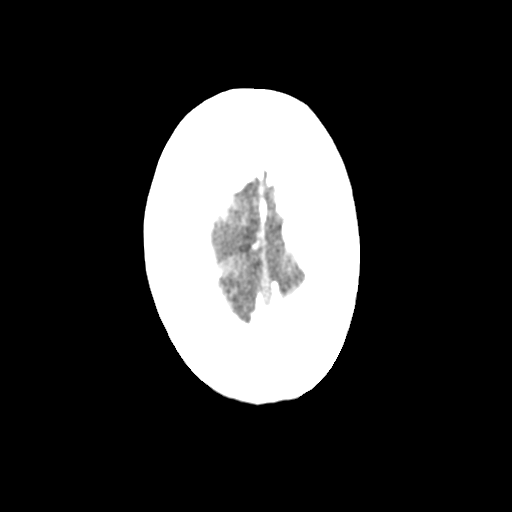

[Series 3: head bone · axial · 0.39mm/px · z∈[-128,-72]mm · 4 of 81 slices shown]
[im 9/81  bone]
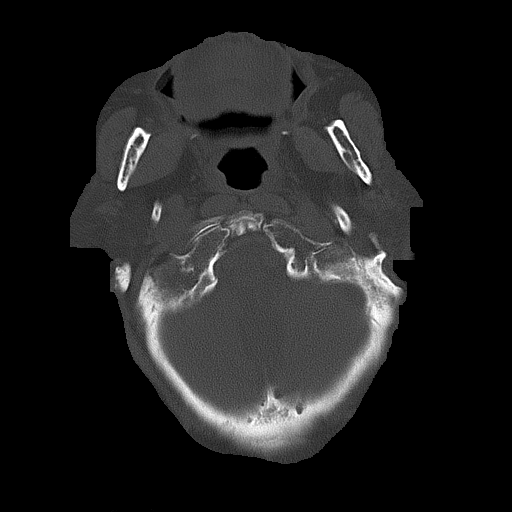
[im 17/81  bone]
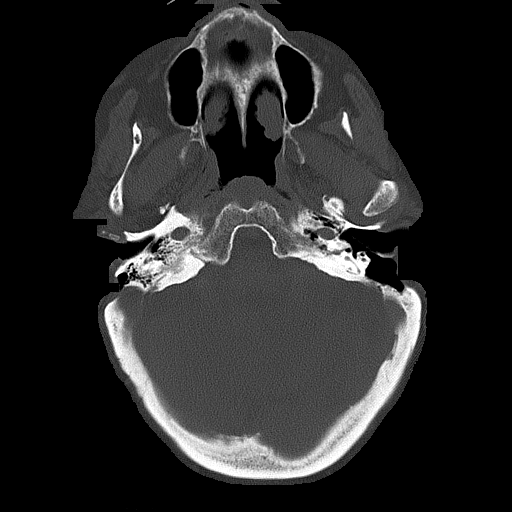
[im 25/81  bone]
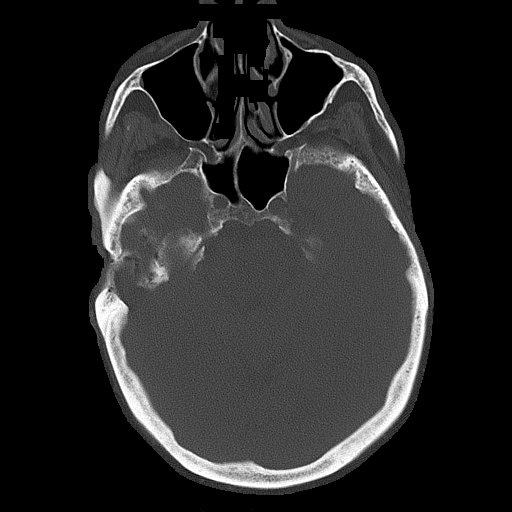
[im 37/81  bone]
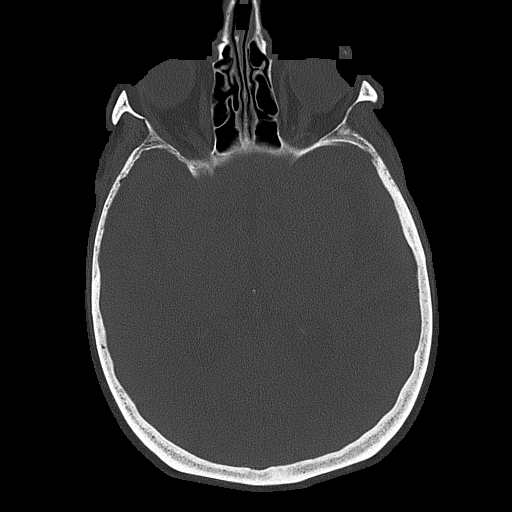

[Series 4: head without cor · coronal · non-contrast · 0.31mm/px · 3 of 67 slices shown]
[im 23/67  brain]
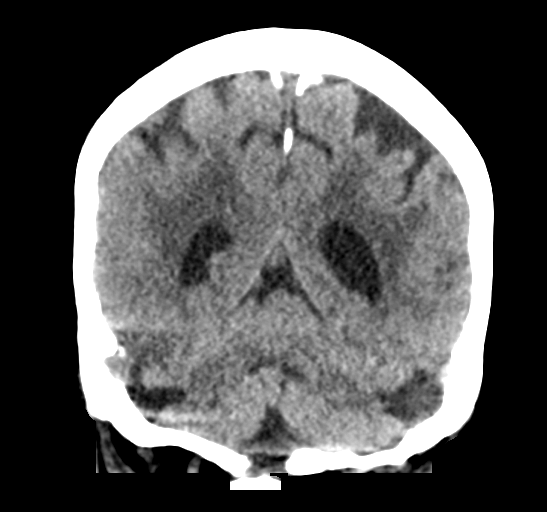
[im 30/67  brain]
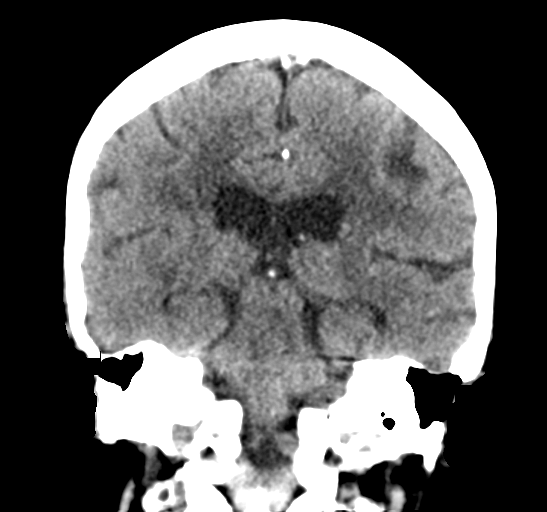
[im 37/67  brain]
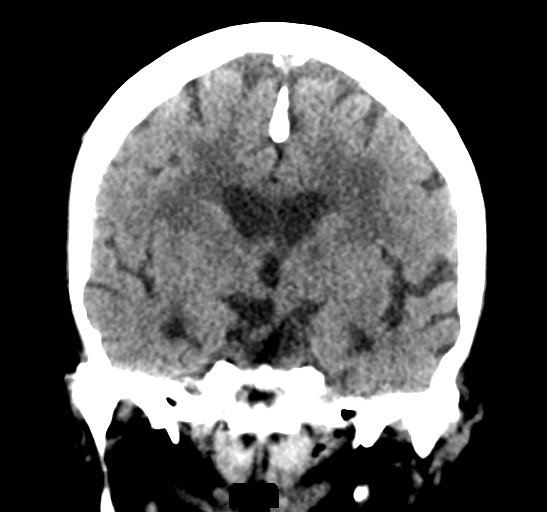

[Series 5: head without sag · sagittal · non-contrast · 0.31mm/px · 3 of 53 slices shown]
[im 18/53  brain]
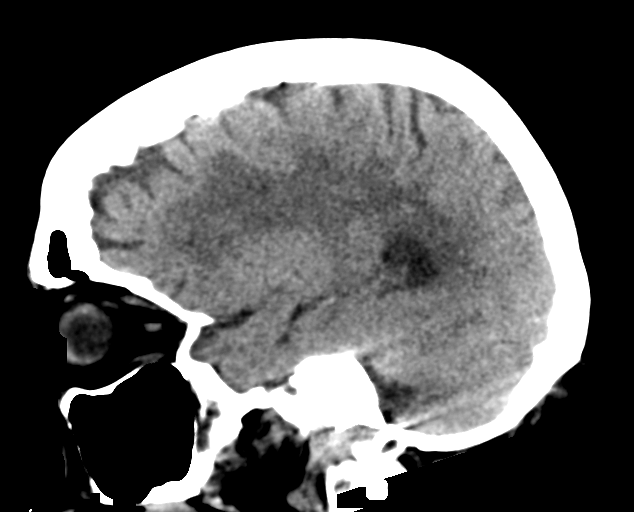
[im 27/53  brain]
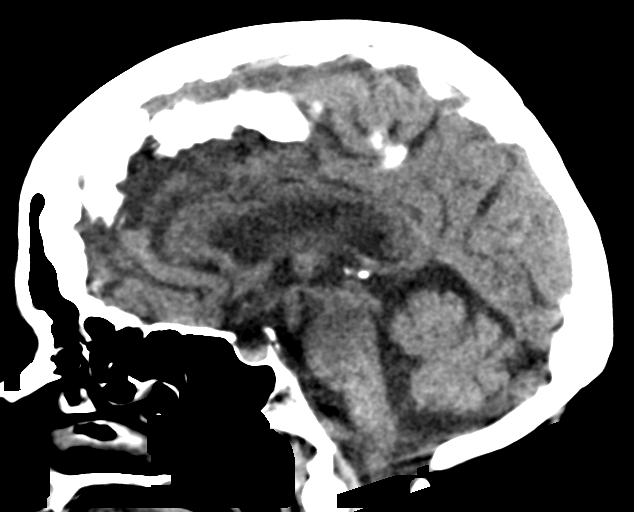
[im 35/53  brain]
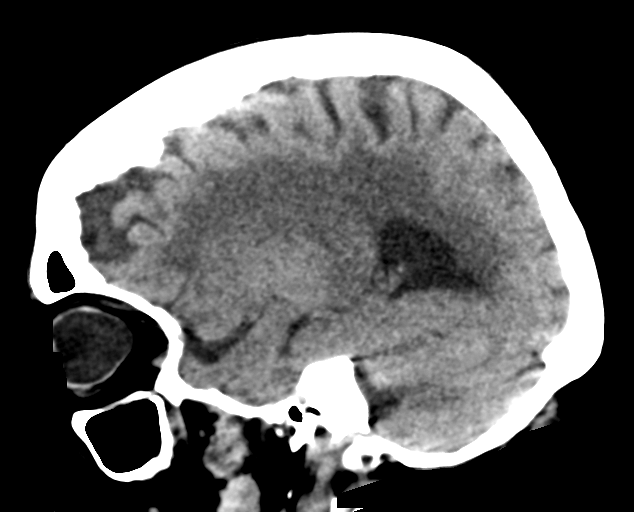

[17 of 47 positions shown; findings below may reference images not displayed]

FINDINGS: Brain: Extensive chronic small-vessel ischemic changes are seen
throughout the pons, thalami and cerebral hemispheric white matter.
No sign of acute infarction, mass lesion, hemorrhage, hydrocephalus
or extra-axial collection.

Vascular: There is atherosclerotic calcification of the major
vessels at the base of the brain.

Skull: Normal

Sinuses/Orbits: Sinuses are clear. Bilateral coloboma or severe
axial myopia.

Other: None
IMPRESSION: No acute finding by CT. Advanced chronic small-vessel ischemic
changes affecting the pons, thalami and cerebral hemispheric white
matter.

Chronic coloboma or axial myopia.

## 2021-05-11 IMAGING — DX DG CHEST 1V PORT
1 series · 1 of 1 positions shown · non-contrast
Comparison: None.

CLINICAL DATA: Altered mental status.

EXAM:
PORTABLE CHEST 1 VIEW

[chest ap]
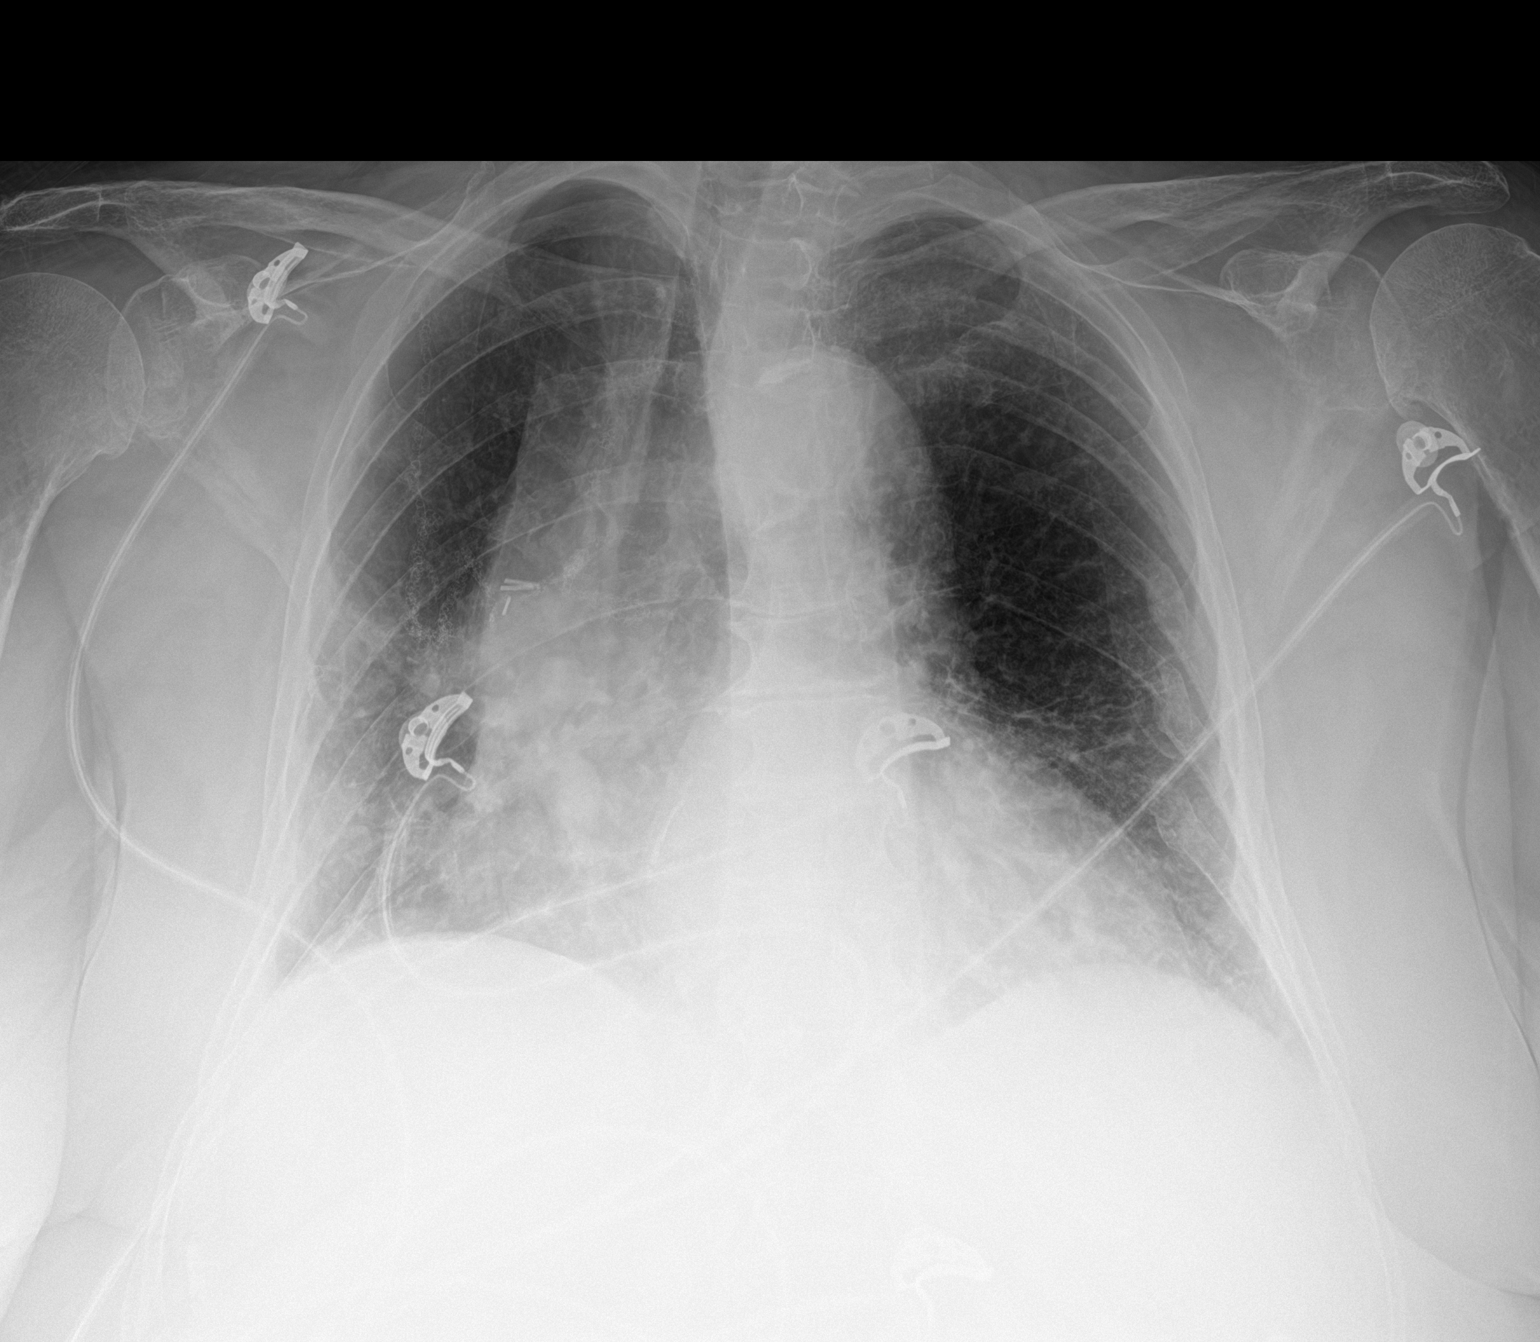

[1 of 1 positions shown; findings below may reference images not displayed]

FINDINGS: Heart is enlarged. Chronic interstitial changes are stable. No edema
or effusion is present. No focal airspace disease is present.
Atherosclerotic changes are noted in the aortic arch. Remote
left-sided rib fractures again noted.
IMPRESSION: 1. Stable cardiomegaly without failure.
2. No acute cardiopulmonary disease.
3. Stable chronic interstitial changes.

## 2021-05-11 MED ORDER — MIRTAZAPINE 15 MG PO TABS
7.5000 mg | ORAL_TABLET | Freq: Every day | ORAL | Status: DC
  Administered 2021-05-11 – 2021-05-12 (×2): 7.5 mg via ORAL
  Filled 2021-05-11 (×2): qty 1

## 2021-05-11 MED ORDER — ACETAMINOPHEN 650 MG RE SUPP: 650.0000 mg | Freq: Four times a day (QID) | RECTAL | Status: DC | PRN

## 2021-05-11 MED ORDER — SODIUM CHLORIDE 0.9 % IV SOLN
1.0000 g | INTRAVENOUS | Status: DC
  Administered 2021-05-12: 1 g via INTRAVENOUS
  Filled 2021-05-11 (×2): qty 10

## 2021-05-11 MED ORDER — ASPIRIN EC 81 MG PO TBEC
81.0000 mg | DELAYED_RELEASE_TABLET | Freq: Every day | ORAL | Status: DC
  Administered 2021-05-12 – 2021-05-13 (×2): 81 mg via ORAL
  Filled 2021-05-11 (×2): qty 1

## 2021-05-11 MED ORDER — ENOXAPARIN SODIUM 40 MG/0.4ML IJ SOSY: 40.0000 mg | PREFILLED_SYRINGE | INTRAMUSCULAR | Status: DC

## 2021-05-11 MED ORDER — ENOXAPARIN SODIUM 40 MG/0.4ML IJ SOSY
40.0000 mg | PREFILLED_SYRINGE | INTRAMUSCULAR | Status: DC
  Administered 2021-05-12 – 2021-05-13 (×2): 40 mg via SUBCUTANEOUS
  Filled 2021-05-11 (×2): qty 0.4

## 2021-05-11 MED ORDER — ATORVASTATIN CALCIUM 10 MG PO TABS
20.0000 mg | ORAL_TABLET | Freq: Every evening | ORAL | Status: DC
  Administered 2021-05-11 – 2021-05-12 (×2): 20 mg via ORAL
  Filled 2021-05-11 (×2): qty 2

## 2021-05-11 MED ORDER — ONDANSETRON HCL 4 MG PO TABS: 4.0000 mg | ORAL_TABLET | Freq: Four times a day (QID) | ORAL | Status: DC | PRN

## 2021-05-11 MED ORDER — PANTOPRAZOLE SODIUM 40 MG PO TBEC
40.0000 mg | DELAYED_RELEASE_TABLET | Freq: Every day | ORAL | Status: DC
  Administered 2021-05-12 – 2021-05-13 (×2): 40 mg via ORAL
  Filled 2021-05-11 (×2): qty 1

## 2021-05-11 MED ORDER — OXYBUTYNIN CHLORIDE ER 5 MG PO TB24
5.0000 mg | ORAL_TABLET | Freq: Every day | ORAL | Status: DC
  Administered 2021-05-11 – 2021-05-12 (×2): 5 mg via ORAL
  Filled 2021-05-11 (×3): qty 1

## 2021-05-11 MED ORDER — ACETAMINOPHEN 325 MG PO TABS: 650.0000 mg | ORAL_TABLET | Freq: Four times a day (QID) | ORAL | Status: DC | PRN

## 2021-05-11 MED ORDER — ONDANSETRON HCL 4 MG/2ML IJ SOLN: 4.0000 mg | Freq: Four times a day (QID) | INTRAMUSCULAR | Status: DC | PRN

## 2021-05-11 MED ORDER — SERTRALINE HCL 100 MG PO TABS
100.0000 mg | ORAL_TABLET | Freq: Every day | ORAL | Status: DC
  Administered 2021-05-11 – 2021-05-12 (×2): 100 mg via ORAL
  Filled 2021-05-11 (×2): qty 1

## 2021-05-11 MED ORDER — DIAZEPAM 5 MG PO TABS: 2.5000 mg | ORAL_TABLET | Freq: Three times a day (TID) | ORAL | Status: DC | PRN

## 2021-05-11 MED ORDER — SODIUM CHLORIDE 0.9 % IV SOLN
1.0000 g | Freq: Once | INTRAVENOUS | Status: AC
  Administered 2021-05-11: 1 g via INTRAVENOUS
  Filled 2021-05-11: qty 10

## 2021-05-11 NOTE — ED Provider Notes (Signed)
[facility] EMERGENCY DEPARTMENT Provider Note   CSN: [id number] Arrival date & time: 05/11/21  1356     History Chief Complaint  Patient presents with   Altered Mental Status    [name] is a 80 y.o. female who presents the emergency department with a chief complaint of altered mental status.  EMS reports that her son states that he has noticed she has been acting more confused over the past few days.  He is not sure if she is taking too many of her medications.  EMS reports that she smelled of urine and was mildly hypotensive when they arrived to see her.  Patient admits that she feels like she is not thinking clearly.  She denies abdominal pain, chest pain, shortness of breath, burning with urination.   Altered Mental Status     Past Medical History:  Diagnosis Date   Adenocarcinoma of right lung, stage 1 ([place]) 07/28/2016   Anxiety    Asthma    Cancer ([place])    uterine   Closed fracture of left distal radius 06/13/2020   COPD (chronic obstructive pulmonary disease) (HCC)    Coronary artery disease    mild nonobstructive by 2019 cath   Cyst    left side of neck   Depression    GERD (gastroesophageal reflux disease)    Greater trochanter fracture ([place]), left 06/11/2020   Heart murmur    Hyperlipidemia    Hypertension    Low back pain    Osteoarthritis    Osteoporosis    Takotsubo cardiomyopathy    EF recovered to 50-55% by 01/23/18 echo   Vitamin D insufficiency 06/13/2020    Patient Active Problem List   Diagnosis Date Noted   Closed fracture of left distal radius 06/13/2020   Vitamin D insufficiency 06/13/2020   Greater trochanter fracture ([place]), left 06/11/2020   Cough    Acute metabolic encephalopathy 96/29/5284   S/P total knee replacement 05/27/2019   DDD (degenerative disc disease), 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   Bilateral renal cysts 05/21/2018   Hiatal hernia 05/21/2018   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   Thoracic radiculitis  (Bilateral) (L>R) 05/03/2018   Chronic thoracic back pain (Left) 05/03/2018   Injury of peripheral nerves of thorax, sequela 03/22/2018   Chest pain 03/21/2018   Rib pain (Left) 03/21/2018   Chronic rib pain (Right) 03/20/2018   DDD (degenerative disc disease), lumbar 03/20/2018   Chronic fracture of pubic ramus, sequela (Right) 03/20/2018   History of femoral neck fracture, sequela (Left) 03/20/2018    Class: History of   Lumbar facet arthropathy (Bilateral) 03/20/2018   Lumbar facet syndrome (Bilateral) 03/20/2018   Osteoarthritis of facet joint of lumbar spine 03/20/2018   Spondylosis without myelopathy or radiculopathy, lumbosacral region 03/20/2018   Other specified dorsopathies, sacral and sacrococcygeal region 03/20/2018   Chronic pain syndrome 02/26/2018   Cancer related pain 02/26/2018   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   Chronic low back pain (Secondary Area of Pain) (Bilateral) 02/26/2018   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   Chronic lower extremity pain (Tertiary Area of Pain) (Bilateral) (R>L) 02/26/2018   Chronic hip pain (Fourth Area of Pain) (Bilateral) (R>L) 02/26/2018   Neurogenic pain 02/26/2018   Pharmacologic therapy 02/26/2018   Problems influencing health status 02/26/2018   Long term prescription benzodiazepine use 02/26/2018   Long term prescription opiate use 13/24/4010   Acute systolic heart failure (HCC)    Non-ST elevation (  NSTEMI) myocardial infarction [facility])    History of uterine cancer 12/08/2017   Generalized anxiety disorder 12/08/2017   GERD (gastroesophageal reflux disease) 12/08/2017   Substernal chest pain 12/08/2017   Elevated brain natriuretic peptide (BNP) level 12/08/2017   Elevated troponin 12/08/2017   Embedded tick of upper back excluding scapular region 12/08/2017   Chronic sacroiliac joint pain 06/21/2017   Disorder of skeletal system 06/21/2017   Other long term  (current) drug therapy 06/21/2017   Other specified health status 06/21/2017   Chronic chest wall pain (Primary Area of Pain) 06/21/2017   Chronic post-thoracotomy pain 03/03/2017   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   Adenocarcinoma of right lung, stage 1 ([place]) 07/28/2016   S/P lobectomy of lung 05/09/2016   DVT (deep venous thrombosis) ([place]) 04/26/2016   Heart murmur    Acute exacerbation of chronic obstructive pulmonary disease (COPD) ([place]) 08/29/2015   Essential hypertension 08/29/2015   Hypercholesterolemia 08/29/2015   CKD (chronic kidney disease), stage III ([place]) 08/29/2015   COPD (chronic obstructive pulmonary disease) ([place]) 04/27/2015   Acute on chronic respiratory failure with hypoxia ([place]) 04/27/2015   Infected sebaceous cyst 08/16/2011    Past Surgical History:  Procedure Laterality Date   ANKLE SURGERY Right    horse accident   APPENDECTOMY     BACK SURGERY     CHOLECYSTECTOMY     EYE SURGERY     lense implant   FRACTURE SURGERY     HIP SURGERY  01/2010   Left Hip    INNER EAR SURGERY Bilateral 1970   related to severe ear infections   KNEE SURGERY     Right knee   LEFT HEART CATH AND CORONARY ANGIOGRAPHY N/A 12/11/2017   Procedure: LEFT HEART CATH AND CORONARY ANGIOGRAPHY;  Surgeon: Jettie Booze, MD;  Location: [facility];  Service: Cardiovascular;  Laterality: N/A;   LOBECTOMY Right 05/09/2016   Procedure: RIGHT UPPER LOBECTOMY;  Surgeon: [name], MD;  Location: [facility];  Service: Thoracic;  Laterality: Right;   MASS EXCISION  08/25/2011   Procedure: EXCISION MASS;  Surgeon: Harl Bowie, MD;  Location: [facility];  Service: General;  Laterality: N/A;  excision left neck mass   OPEN REDUCTION INTERNAL FIXATION (ORIF) DISTAL RADIAL FRACTURE Left 06/12/2020   Procedure: OPEN REDUCTION INTERNAL FIXATION (ORIF) DISTAL RADIAL FRACTURE;  Surgeon: Shona Needles, MD;  Location: [facility];  Service:  Orthopedics;  Laterality: Left;   TOTAL ABDOMINAL HYSTERECTOMY     TOTAL KNEE ARTHROPLASTY Right 05/27/2019   Procedure: TOTAL KNEE ARTHROPLASTY;  Surgeon: Vickey Huger, MD;  Location: [facility];  Service: Orthopedics;  Laterality: Right;  75 mins needed for length of case   TOTAL KNEE ARTHROPLASTY Left 12/30/2019   Procedure: TOTAL KNEE ARTHROPLASTY;  Surgeon: Vickey Huger, MD;  Location: [facility];  Service: Orthopedics;  Laterality: Left;   VIDEO ASSISTED THORACOSCOPY (VATS)/WEDGE RESECTION Right 05/09/2016   Procedure: VIDEO ASSISTED THORACOSCOPY (VATS)/WEDGE RESECTION;  Surgeon: [name], MD;  Location: [facility];  Service: Thoracic;  Laterality: Right;     OB History   No obstetric history on file.     Family History  Problem Relation Age of Onset   Heart disease Mother    Heart disease Father    Cancer Sister        #1, breast   Heart disease Brother        had CABG    Social History   Tobacco Use  Smoking status: Former    Packs/day: 2.00    Years: 51.00    Pack years: 102.00    Types: Cigarettes    Quit date: 05/04/2012    Years since quitting: 9.0   Smokeless tobacco: Never  Vaping Use   Vaping Use: Former   Quit date: 12/01/2017  Substance Use Topics   Alcohol use: No    Alcohol/week: 0.0 standard drinks   Drug use: No    Home Medications Prior to Admission medications   Medication Sig Start Date End Date Taking? Authorizing Provider  acetaminophen (TYLENOL) 500 MG tablet Take 2 tablets (1,000 mg total) by mouth every 6 (six) hours. Patient taking differently: Take 1,000 mg by mouth every 6 (six) hours as needed for moderate pain.  05/28/19   Donia Ast, PA  albuterol (PROVENTIL HFA;VENTOLIN HFA) 108 (90 BASE) MCG/ACT inhaler Inhale 2 puffs into the lungs every 6 (six) hours as needed. Patient taking differently: Inhale 2 puffs into the lungs every 4 (four) hours as needed for wheezing or shortness of breath.  04/28/15   Hosie Poisson, MD   albuterol (PROVENTIL) (2.5 MG/3ML) 0.083% nebulizer solution Take 3 mLs (2.5 mg total) by nebulization every 2 (two) hours as needed for wheezing or shortness of breath. 04/28/15   Hosie Poisson, MD  aluminum-magnesium hydroxide-simethicone (MAALOX) 401-027-25 MG/5ML SUSP Take 30 mLs by mouth 3 (three) times daily as needed (indigestion).     [provider]  amLODipine (NORVASC) 5 MG tablet Take 5 mg by mouth daily.    [provider]  aspirin EC 325 MG EC tablet Take 1 tablet (325 mg total) by mouth 2 (two) times daily. Patient taking differently: Take 162.5 mg by mouth 2 (two) times daily.  12/31/19   Donia Ast, PA  atorvastatin (LIPITOR) 20 MG tablet Take 20 mg by mouth every evening.     [provider]  cephALEXin (KEFLEX) 500 MG capsule Take 1 capsule (500 mg total) by mouth 4 (four) times daily. 10/29/20   Fredia Sorrow, MD  diazepam (VALIUM) 5 MG tablet Take 0.5 tablets (2.5 mg total) by mouth every 8 (eight) hours as needed for anxiety. Must last 30 days. 06/16/20   Lavina Hamman, MD  ferrous sulfate 325 (65 FE) MG tablet Take 1 tablet (325 mg total) by mouth daily with breakfast. 06/16/20   Lavina Hamman, MD  furosemide (LASIX) 20 MG tablet Take 20 mg by mouth every Monday, Wednesday, and Friday. Monday and Wednesday 11/04/19   [provider]  gabapentin (NEURONTIN) 400 MG capsule Take 1 capsule (400 mg total) by mouth 4 (four) times daily. Patient taking differently: Take 400 mg by mouth 3 (three) times daily.  05/24/18 06/11/20  Vevelyn Francois, NP  meclizine (ANTIVERT) 25 MG tablet Take 1 tablet (25 mg total) by mouth 3 (three) times daily as needed for dizziness. 10/29/20   Fredia Sorrow, MD  meloxicam (MOBIC) 7.5 MG tablet Take 7.5 mg by mouth daily.    [provider]  metoCLOPramide (REGLAN) 5 MG tablet Take 5 mg by mouth 2 (two) times daily.     [provider]  metoprolol tartrate (LOPRESSOR) 25 MG tablet Take 0.5  tablets (12.5 mg total) by mouth 2 (two) times daily. 12/12/17   Domenic Polite, MD  mirtazapine (REMERON) 7.5 MG tablet Take 7.5 mg by mouth at bedtime. 11/04/19   [provider]  Multiple Vitamin (MULTIVITAMIN WITH MINERALS) TABS tablet Take 1 tablet by mouth daily.  06/17/20   Lavina Hamman, MD  oxybutynin (DITROPAN-XL) 5 MG 24 hr tablet Take 5 mg by mouth at bedtime. 09/06/19   [provider]  pantoprazole (PROTONIX) 40 MG tablet Take 1 tablet (40 mg total) by mouth daily at 6 (six) AM. 04/28/15   Hosie Poisson, MD  raloxifene (EVISTA) 60 MG tablet Take 60 mg by mouth daily.    [provider]  sertraline (ZOLOFT) 100 MG tablet Take 100 mg by mouth in the morning and at bedtime.     [provider]  tiZANidine (ZANAFLEX) 2 MG tablet Take 1 tablet (2 mg total) by mouth every 6 (six) hours as needed. Patient taking differently: Take 2 mg by mouth every 6 (six) hours as needed for muscle spasms.  05/28/19   Donia Ast, PA  traMADol (ULTRAM) 50 MG tablet Take 1 tablet (50 mg total) by mouth 3 (three) times daily as needed for moderate pain. 06/16/20   Lavina Hamman, MD  TRELEGY ELLIPTA 100-62.5-25 MCG/INH AEPB Inhale 1 puff into the lungs daily. 05/14/19   [provider]  Vitamin D, Ergocalciferol, (DRISDOL) 1.25 MG (50000 UNIT) CAPS capsule Take 1 capsule (50,000 Units total) by mouth every 7 (seven) days. 06/20/20   Lavina Hamman, MD    Allergies    Nicki [place] sodium], Lyrica [pregabalin], and Levofloxacin  Review of Systems   Review of Systems  Unable to perform ROS: Mental status change   Physical Exam Updated Vital Signs BP 118/75   Pulse (!) 50   Resp 17   SpO2 100%   Physical Exam Vitals and nursing note reviewed.  Constitutional:      General: She is not in acute distress.    Appearance: She is well-developed. She is not diaphoretic.  HENT:     Head: Normocephalic and atraumatic.     Right Ear: External ear  normal.     Left Ear: External ear normal.     Nose: Nose normal.     Mouth/Throat:     Mouth: Mucous membranes are moist.  Eyes:     General: No scleral icterus.    Conjunctiva/sclera: Conjunctivae normal.  Cardiovascular:     Rate and Rhythm: Normal rate and regular rhythm.     Heart sounds: Normal heart sounds. No murmur heard.   No friction rub. No gallop.  Pulmonary:     Effort: Pulmonary effort is normal. No respiratory distress.     Breath sounds: Normal breath sounds.  Abdominal:     General: Bowel sounds are normal. There is no distension.     Palpations: Abdomen is soft. There is no mass.     Tenderness: There is no abdominal tenderness. There is no guarding.  Musculoskeletal:     Cervical back: Normal range of motion.  Skin:    General: Skin is warm and dry.  Neurological:     Mental Status: She is alert and oriented to person, place, and time. She is confused.     GCS: GCS eye subscore is 4. GCS verbal subscore is 5. GCS motor subscore is 6.  Psychiatric:        Behavior: Behavior normal.    ED Results / Procedures / Treatments   Labs (all labs ordered are listed, but only abnormal results are displayed) Labs Reviewed  CBG MONITORING, ED - Abnormal; Notable for the following components:      Result Value   Glucose-Capillary 120 (*)    All other components within  normal limits    EKG None  Radiology No results found.  Procedures Procedures   Medications Ordered in ED Medications - No data to display  ED Course  I have reviewed the triage vital signs and the nursing notes.  Pertinent labs & imaging results that were available during my care of the patient were reviewed by me and considered in my medical decision making (see chart for details).    MDM Rules/Calculators/A&P                           Patient here with AMS. The differential diagnosis for AMS is extensive and includes, but is not limited to: drug overdose - opioids, alcohol,  sedatives, antipsychotics, drug withdrawal, others; Metabolic: hypoxia, hypoglycemia, hyperglycemia, hypercalcemia, hypernatremia, hyponatremia, uremia, hepatic encephalopathy, hypothyroidism, hyperthyroidism, vitamin B12 or thiamine deficiency, carbon monoxide poisoning, Wilson's disease, Lactic acidosis, DKA/HHOS; Infectious: meningitis, encephalitis, bacteremia/sepsis, urinary tract infection, pneumonia, neurosyphilis; Structural: Space-occupying lesion, (brain tumor, subdural hematoma, hydrocephalus,); Vascular: stroke, subarachnoid hemorrhage, coronary ischemia, hypertensive encephalopathy, CNS vasculitis, thrombotic thrombocytopenic purpura, disseminated intravascular coagulation, hyperviscosity; Psychiatric: Schizophrenia, depression; Other: Seizure, hypothermia, heat stroke, ICU psychosis, dementia -"sundowning."  I ordered labs. CBC shows HGB of 7.6 down from 11.6 six months ago. Remainder oflabs and imaging are pending. I have given signout to PA [place] at shift change. Final Clinical Impression(s) / ED Diagnoses Final diagnoses:  None    Rx / DC Orders ED Discharge Orders     None        Margarita Mail, PA-C 05/11/21 1619    Valarie Merino, MD 05/16/21 [phone]

## 2021-05-11 NOTE — ED Triage Notes (Signed)
BIB [facility] after pt son called to pt being altered x 3 days. Per EMS, pt also having increase weakness and fall.   HX: lung cancer, back pain

## 2021-05-11 NOTE — Assessment & Plan Note (Signed)
Stable.  We will hold Lopressor due to bradycardia.  May need to increase her Norvasc.  Patient reportedly hypotensive when EMS arrived.  She did receive fluid bolus.  Hold her home blood pressure medications for now.  Monitor blood pressure.

## 2021-05-11 NOTE — Assessment & Plan Note (Signed)
Stable.  Currently not exacerbated.

## 2021-05-11 NOTE — Assessment & Plan Note (Addendum)
-   Urinalysis noted with nitrites and leukocyte esterase but low WBC and rare bacteria - Weakness may be multifactorial on admission from UTI and/or anemia - Continue Rocephin and follow-up cultures

## 2021-05-11 NOTE — ED Notes (Signed)
Received verbal report from [facility] B RN at this time

## 2021-05-11 NOTE — H&P (Signed)
History and Physical    [name] KXF:[id number] DOB: 1941/04/14 DOA: 05/11/2021  PCP: Wenda Low, MD   Patient coming from: Home  I have personally briefly reviewed patient's old medical records in [place]  CC: weakness HPI: 80 year old female with a history of COPD, hypertension, prior history of lung cancer, currently does not use home oxygen, obesity, chronic pain syndrome presents to the ER today via EMS.  Patient states that she got up this morning about 1 or 2:00 in the morning to use the restroom.  She was using her walker.  She states that she went to the bathroom.  She came back out the bathroom.  She states she felt weak in the legs.  He states that she slid to the floor.  She denies striking her head.  Reportedly son stated the patient has been altered for 3 days.  Son is left the ER.  On arrival to the ER, temperature 97.5, heart rate 50 blood pressure 118/75 satting 100% on room air.  Laboratory evaluation in the ER  Hemoglobin of 7.6, platelets of 275.  Prior hemoglobin in April 2022 was 11.6.  Patient does have a baseline hemoglobin approximately 8 to 9 g/dL  Chemistry sodium 137, potassium 4.4, bicarb 25, BUN of 13, creatinine 1.26.  Urinalysis demonstrated positive nitrates and leukocyte esterase.  Rare bacteria.  Chest x-ray demonstrated no acute car pulm disease.  CT head was negative for acute intracranial abnormality.  Due to continued weakness, [facility] hospitalist contacted for admission.   ED Course: UA positive for nitrites and LE. CXR negative for pna, CT head negative for CVA. Pt still c/o weakness.  Review of Systems:  Review of Systems  Constitutional:  Positive for malaise/fatigue. Negative for chills, fever and weight loss.  HENT: Negative.    Eyes: Negative.   Respiratory: Negative.  Negative for cough, hemoptysis and sputum production.   Cardiovascular: Negative.  Negative for chest pain, palpitations and orthopnea.   Gastrointestinal: Negative.  Negative for abdominal pain, heartburn, nausea and vomiting.  Genitourinary: Negative.  Negative for dysuria, frequency and urgency.  Musculoskeletal:  Positive for back pain.  Skin: Negative.   Neurological:  Positive for dizziness and weakness.  Endo/Heme/Allergies: Negative.   Psychiatric/Behavioral: Negative.    All other systems reviewed and are negative.  Past Medical History:  Diagnosis Date  . Acute exacerbation of chronic obstructive pulmonary disease (COPD) ([place]) 08/29/2015  . Acute metabolic encephalopathy 69/12/7891  . Acute on chronic respiratory failure with hypoxia ([place]) 04/27/2015  . Adenocarcinoma of right lung, stage 1 ([place]) 07/28/2016  . Anxiety   . Asthma   . Cancer ([place])    uterine  . Closed fracture of left distal radius 06/13/2020  . COPD (chronic obstructive pulmonary disease) ([place])   . Coronary artery disease    mild nonobstructive by 2019 cath  . Cyst    left side of neck  . Depression   . GERD (gastroesophageal reflux disease)   . Greater trochanter fracture ([place]), left 06/11/2020  . Heart murmur   . Hyperlipidemia   . Hypertension   . Low back pain   . Non-ST elevation (NSTEMI) myocardial infarction ([place])   . Osteoarthritis   . Osteoporosis   . Takotsubo cardiomyopathy    EF recovered to 50-55% by 01/23/18 echo  . Vitamin D insufficiency 06/13/2020    Past Surgical History:  Procedure Laterality Date  . ANKLE SURGERY Right    horse accident  . APPENDECTOMY    .  BACK SURGERY    . CHOLECYSTECTOMY    . EYE SURGERY     lense implant  . FRACTURE SURGERY    . HIP SURGERY  01/2010   Left Hip   . INNER EAR SURGERY Bilateral 1970   related to severe ear infections  . KNEE SURGERY     Right knee  . LEFT HEART CATH AND CORONARY ANGIOGRAPHY N/A 12/11/2017   Procedure: LEFT HEART CATH AND CORONARY ANGIOGRAPHY;  Surgeon: Jettie Booze, MD;  Location: [facility];  Service: Cardiovascular;  Laterality: N/A;  .  LOBECTOMY Right 05/09/2016   Procedure: RIGHT UPPER LOBECTOMY;  Surgeon: [name], MD;  Location: [facility];  Service: Thoracic;  Laterality: Right;  . MASS EXCISION  08/25/2011   Procedure: EXCISION MASS;  Surgeon: Harl Bowie, MD;  Location: [facility];  Service: General;  Laterality: N/A;  excision left neck mass  . OPEN REDUCTION INTERNAL FIXATION (ORIF) DISTAL RADIAL FRACTURE Left 06/12/2020   Procedure: OPEN REDUCTION INTERNAL FIXATION (ORIF) DISTAL RADIAL FRACTURE;  Surgeon: Shona Needles, MD;  Location: [facility];  Service: Orthopedics;  Laterality: Left;  . TOTAL ABDOMINAL HYSTERECTOMY    . TOTAL KNEE ARTHROPLASTY Right 05/27/2019   Procedure: TOTAL KNEE ARTHROPLASTY;  Surgeon: Vickey Huger, MD;  Location: [facility];  Service: Orthopedics;  Laterality: Right;  75 mins needed for length of case  . TOTAL KNEE ARTHROPLASTY Left 12/30/2019   Procedure: TOTAL KNEE ARTHROPLASTY;  Surgeon: Vickey Huger, MD;  Location: [facility];  Service: Orthopedics;  Laterality: Left;  Marland Kitchen VIDEO ASSISTED THORACOSCOPY (VATS)/WEDGE RESECTION Right 05/09/2016   Procedure: VIDEO ASSISTED THORACOSCOPY (VATS)/WEDGE RESECTION;  Surgeon: [name], MD;  Location: [facility];  Service: Thoracic;  Laterality: Right;     reports that she quit smoking about 9 years ago. Her smoking use included cigarettes. She has a 102.00 pack-year smoking history. She has never used smokeless tobacco. She reports that she does not drink alcohol and does not use drugs.  Allergies  Allergen Reactions  . Boniva [Ibandronate Sodium] Other (See Comments)    UNSPECIFIED REACTION   . Lyrica [Pregabalin] Nausea And Vomiting    Dizziness and blurred vision as well  . Levofloxacin     Pt states she isn't sure     Family History  Problem Relation Age of Onset  . Heart disease Mother   . Heart disease Father   . Cancer Sister        #1, breast  . Heart disease Brother        had CABG    Prior to Admission  medications   Medication Sig Start Date End Date Taking? Authorizing Provider  acetaminophen (TYLENOL) 500 MG tablet Take 2 tablets (1,000 mg total) by mouth every 6 (six) hours. Patient taking differently: Take 1,000 mg by mouth every 6 (six) hours as needed for moderate pain.  05/28/19   Donia Ast, PA  albuterol (PROVENTIL HFA;VENTOLIN HFA) 108 (90 BASE) MCG/ACT inhaler Inhale 2 puffs into the lungs every 6 (six) hours as needed. Patient taking differently: Inhale 2 puffs into the lungs every 4 (four) hours as needed for wheezing or shortness of breath.  04/28/15   Hosie Poisson, MD  albuterol (PROVENTIL) (2.5 MG/3ML) 0.083% nebulizer solution Take 3 mLs (2.5 mg total) by nebulization every 2 (two) hours as needed for wheezing or shortness of breath. 04/28/15   Hosie Poisson, MD  aluminum-magnesium hydroxide-simethicone (MAALOX) 200-200-20 MG/5ML SUSP Take 30 mLs  by mouth 3 (three) times daily as needed (indigestion).     [provider]  amLODipine (NORVASC) 5 MG tablet Take 5 mg by mouth daily.    [provider]  aspirin EC 325 MG EC tablet Take 1 tablet (325 mg total) by mouth 2 (two) times daily. Patient taking differently: Take 162.5 mg by mouth 2 (two) times daily.  12/31/19   Donia Ast, PA  atorvastatin (LIPITOR) 20 MG tablet Take 20 mg by mouth every evening.     [provider]  cephALEXin (KEFLEX) 500 MG capsule Take 1 capsule (500 mg total) by mouth 4 (four) times daily. 10/29/20   Fredia Sorrow, MD  diazepam (VALIUM) 5 MG tablet Take 0.5 tablets (2.5 mg total) by mouth every 8 (eight) hours as needed for anxiety. Must last 30 days. 06/16/20   Lavina Hamman, MD  ferrous sulfate 325 (65 FE) MG tablet Take 1 tablet (325 mg total) by mouth daily with breakfast. 06/16/20   Lavina Hamman, MD  furosemide (LASIX) 20 MG tablet Take 20 mg by mouth every Monday, Wednesday, and Friday. Monday and Wednesday 11/04/19   [provider]   gabapentin (NEURONTIN) 400 MG capsule Take 1 capsule (400 mg total) by mouth 4 (four) times daily. Patient taking differently: Take 400 mg by mouth 3 (three) times daily.  05/24/18 06/11/20  Vevelyn Francois, NP  meclizine (ANTIVERT) 25 MG tablet Take 1 tablet (25 mg total) by mouth 3 (three) times daily as needed for dizziness. 10/29/20   Fredia Sorrow, MD  meloxicam (MOBIC) 7.5 MG tablet Take 7.5 mg by mouth daily.    [provider]  metoCLOPramide (REGLAN) 5 MG tablet Take 5 mg by mouth 2 (two) times daily.     [provider]  metoprolol tartrate (LOPRESSOR) 25 MG tablet Take 0.5 tablets (12.5 mg total) by mouth 2 (two) times daily. 12/12/17   Domenic Polite, MD  mirtazapine (REMERON) 7.5 MG tablet Take 7.5 mg by mouth at bedtime. 11/04/19   [provider]  Multiple Vitamin (MULTIVITAMIN WITH MINERALS) TABS tablet Take 1 tablet by mouth daily. 06/17/20   Lavina Hamman, MD  oxybutynin (DITROPAN-XL) 5 MG 24 hr tablet Take 5 mg by mouth at bedtime. 09/06/19   [provider]  pantoprazole (PROTONIX) 40 MG tablet Take 1 tablet (40 mg total) by mouth daily at 6 (six) AM. 04/28/15   Hosie Poisson, MD  raloxifene (EVISTA) 60 MG tablet Take 60 mg by mouth daily.    [provider]  sertraline (ZOLOFT) 100 MG tablet Take 100 mg by mouth in the morning and at bedtime.     [provider]  tiZANidine (ZANAFLEX) 2 MG tablet Take 1 tablet (2 mg total) by mouth every 6 (six) hours as needed. Patient taking differently: Take 2 mg by mouth every 6 (six) hours as needed for muscle spasms.  05/28/19   Donia Ast, PA  traMADol (ULTRAM) 50 MG tablet Take 1 tablet (50 mg total) by mouth 3 (three) times daily as needed for moderate pain. 06/16/20   Lavina Hamman, MD  TRELEGY ELLIPTA 100-62.5-25 MCG/INH AEPB Inhale 1 puff into the lungs daily. 05/14/19   [provider]  Vitamin D, Ergocalciferol, (DRISDOL) 1.25 MG (50000 UNIT) CAPS capsule  Take 1 capsule (50,000 Units total) by mouth every 7 (seven) days. 06/20/20   Lavina Hamman, MD    Physical Exam: Vitals:   05/11/21 1615 05/11/21 1645 05/11/21 1800  05/11/21 1930  BP: 138/66 121/64 130/62 108/78  Pulse: (!) 48 (!) 47 (!) 48 (!) 52  Resp: 15 14 14 14   Temp:      TempSrc:      SpO2: 98% 98% 99% 99%    Physical Exam Vitals and nursing note reviewed.  Constitutional:      General: She is not in acute distress.    Appearance: She is obese. She is not toxic-appearing or diaphoretic.     Comments: Appears chronically ill  HENT:     Head: Normocephalic and atraumatic.     Comments: edentulous    Nose: Nose normal. No rhinorrhea.  Eyes:     General:        Right eye: No discharge.        Left eye: No discharge.  Cardiovascular:     Rate and Rhythm: Regular rhythm. Bradycardia present.     Pulses: Normal pulses.  Pulmonary:     Effort: Pulmonary effort is normal. No respiratory distress.     Breath sounds: Normal breath sounds. No wheezing or rales.  Abdominal:     General: Bowel sounds are normal. There is no distension.     Palpations: Abdomen is soft.     Tenderness: There is no abdominal tenderness. There is no guarding or rebound.  Musculoskeletal:     Right lower leg: No edema.     Left lower leg: No edema.  Skin:    General: Skin is warm and dry.     Capillary Refill: Capillary refill takes less than 2 seconds.  Neurological:     General: No focal deficit present.     Mental Status: She is alert and oriented to person, place, and time.     Labs on Admission: I have personally reviewed following labs and imaging studies  CBC: Recent Labs  Lab 05/11/21 1418  WBC 5.8  NEUTROABS 3.8  HGB 7.6*  HCT 26.6*  MCV 83.6  PLT 185   Basic Metabolic Panel: Recent Labs  Lab 05/11/21 1418  NA 137  K 4.4  CL 105  CO2 25  GLUCOSE 126*  BUN 13  CREATININE 1.26*  CALCIUM 8.1*   GFR: CrCl cannot be calculated (Unknown ideal weight.). Liver  Function Tests: Recent Labs  Lab 05/11/21 1418  AST 18  ALT 11  ALKPHOS 59  BILITOT 0.4  PROT 6.0*  ALBUMIN 2.6*   No results for input(s): LIPASE, AMYLASE in the last 168 hours. Recent Labs  Lab 05/11/21 1418  AMMONIA <10   Coagulation Profile: No results for input(s): INR, PROTIME in the last 168 hours. Cardiac Enzymes: No results for input(s): CKTOTAL, CKMB, CKMBINDEX, TROPONINI in the last 168 hours. BNP (last 3 results) No results for input(s): PROBNP in the last 8760 hours. HbA1C: No results for input(s): HGBA1C in the last 72 hours. CBG: Recent Labs  Lab 05/11/21 1401  GLUCAP 120*   Lipid Profile: No results for input(s): CHOL, HDL, LDLCALC, TRIG, CHOLHDL, LDLDIRECT in the last 72 hours. Thyroid Function Tests: No results for input(s): TSH, T4TOTAL, FREET4, T3FREE, THYROIDAB in the last 72 hours. Anemia Panel: No results for input(s): VITAMINB12, FOLATE, FERRITIN, TIBC, IRON, RETICCTPCT in the last 72 hours. Urine analysis:    Component Value Date/Time   COLORURINE YELLOW 05/11/2021 1733   APPEARANCEUR CLEAR 05/11/2021 1733   LABSPEC 1.004 (L) 05/11/2021 1733   PHURINE 7.0 05/11/2021 1733   GLUCOSEU NEGATIVE 05/11/2021 1733   HGBUR NEGATIVE 05/11/2021 1733   BILIRUBINUR  NEGATIVE 05/11/2021 [place] 05/11/2021 1733   PROTEINUR NEGATIVE 05/11/2021 1733   UROBILINOGEN 0.2 04/27/2015 1229   NITRITE POSITIVE (A) 05/11/2021 1733   LEUKOCYTESUR SMALL (A) 05/11/2021 1733    Radiological Exams on Admission: I have personally reviewed images CT HEAD WO CONTRAST  Result Date: 05/11/2021 CLINICAL DATA:  Mental status changes of unknown cause. EXAM: CT HEAD WITHOUT CONTRAST TECHNIQUE: Contiguous axial images were obtained from the base of the skull through the vertex without intravenous contrast. COMPARISON:  CT and MRI studies from April of this year. FINDINGS: Brain: Extensive chronic small-vessel ischemic changes are seen throughout the pons,  thalami and cerebral hemispheric white matter. No sign of acute infarction, mass lesion, hemorrhage, hydrocephalus or extra-axial collection. Vascular: There is atherosclerotic calcification of the major vessels at the base of the brain. Skull: Normal Sinuses/Orbits: Sinuses are clear. Bilateral coloboma or severe axial myopia. Other: None IMPRESSION: No acute finding by CT. Advanced chronic small-vessel ischemic changes affecting the pons, thalami and cerebral hemispheric white matter. Chronic coloboma or axial myopia. Electronically Signed   By: Nelson Chimes M.D.   On: 05/11/2021 15:35   DG Chest Port 1 View  Result Date: 05/11/2021 CLINICAL DATA:  Altered mental status. EXAM: PORTABLE CHEST 1 VIEW COMPARISON:  None. FINDINGS: Heart is enlarged. Chronic interstitial changes are stable. No edema or effusion is present. No focal airspace disease is present. Atherosclerotic changes are noted in the aortic arch. Remote left-sided rib fractures again noted. IMPRESSION: 1. Stable cardiomegaly without failure. 2. No acute cardiopulmonary disease. 3. Stable chronic interstitial changes. Electronically Signed   By: San Morelle M.D.   On: 05/11/2021 15:07    EKG: I have personally reviewed EKG: sinus bradycardia    Assessment/Plan Principal Problem:   Acute cystitis without hematuria Active Problems:   Sinus bradycardia   Acute on chronic anemia   COPD (chronic obstructive pulmonary disease) (HCC)   Essential hypertension   CKD (chronic kidney disease), stage III (HCC) - baseline SCr 1.3-1.5    Acute cystitis without hematuria Assign to observation. IV rocephin.  Await urine cultures.  Sinus bradycardia Patient with sinus bradycardia with heart rates in the mid 40s.  Patient on Lopressor at home.  Hold Lopressor.  Patient complaining of weakness.  Bradycardic could be source for weakness.  Acute on chronic anemia Patient with worsening anemia compared to 6 months ago.  Hemoccult  negative in the ER.  Check iron studies.  Hemodynamically stable.  Hold on transfusion for now.  COPD (chronic obstructive pulmonary disease) (HCC) Stable.  Currently not exacerbated.  Essential hypertension Stable.  We will hold Lopressor due to bradycardia.  May need to increase her Norvasc.  Patient reportedly hypotensive when EMS arrived.  She did receive fluid bolus.  Hold her home blood pressure medications for now.  Monitor blood pressure.  CKD (chronic kidney disease), stage III (HCC) - baseline SCr 1.3-1.5 Stable.  Baseline creatinine approximately 1.3-1.5.  DVT prophylaxis: Lovenox Code Status: Full Code Family Communication: no family at bedside  Disposition Plan: return home  Consults called: none  Admission status: Observation, Med-Surg   Kristopher Oppenheim, DO [facility] 05/11/2021, 8:02 PM

## 2021-05-11 NOTE — Subjective & Objective (Signed)
CC: weakness HPI: 80 year old female with a history of COPD, hypertension, prior history of lung cancer, currently does not use home oxygen, obesity, chronic pain syndrome presents to the ER today via EMS.  Patient states that she got up this morning about 1 or 2:00 in the morning to use the restroom.  She was using her walker.  She states that she went to the bathroom.  She came back out the bathroom.  She states she felt weak in the legs.  He states that she slid to the floor.  She denies striking her head.  Reportedly son stated the patient has been altered for 3 days.  Son is left the ER.  On arrival to the ER, temperature 97.5, heart rate 50 blood pressure 118/75 satting 100% on room air.  Laboratory evaluation in the ER  Hemoglobin of 7.6, platelets of 275.  Prior hemoglobin in April 2022 was 11.6.  Patient does have a baseline hemoglobin approximately 8 to 9 g/dL  Chemistry sodium 137, potassium 4.4, bicarb 25, BUN of 13, creatinine 1.26.  Urinalysis demonstrated positive nitrates and leukocyte esterase.  Rare bacteria.  Chest x-ray demonstrated no acute car pulm disease.  CT head was negative for acute intracranial abnormality.  Due to continued weakness, [facility] hospitalist contacted for admission.

## 2021-05-11 NOTE — ED Provider Notes (Signed)
  Physical Exam  BP 118/75   Pulse (!) 50   Temp (!) 97.5 F (36.4 C) (Oral)   Resp 17   SpO2 100%   Physical Exam Vitals and nursing note reviewed. Exam conducted with a chaperone present.  Constitutional:      General: She is not in acute distress.    Appearance: She is well-developed.  HENT:     Head: Normocephalic and atraumatic.  Eyes:     Extraocular Movements: Extraocular movements intact.  Cardiovascular:     Rate and Rhythm: Normal rate.  Pulmonary:     Effort: Pulmonary effort is normal.  Abdominal:     General: There is no distension.  Genitourinary:    Comments: No gross hematuria Musculoskeletal:        General: Normal range of motion.     Cervical back: Normal range of motion.  Skin:    General: Skin is warm.     Findings: No rash.  Neurological:     Mental Status: She is alert and oriented to person, place, and time.    ED Course/Procedures     Procedures  MDM   Pt signed out to me by A Harris, PA-C. Please see previous notes for further history.  In brief, pt presenting for evaluation of ams/confusion. This has been progressive over several days. Per EMS, she was mildly hypotensive, normotensive in the ED. Pt and family states she is confused from baseline. Pt lives at home with her son.   Work-up shows mild anemia at 7.6, most recent 11, however she has been anemic in the eights in the past.  Electrolytes otherwise stable.  Hemoccult negative.  Patient denies known bleeding.  Of note, patient is bradycardic, however per chart review, this is not new for her.  CT head negative.  Chest x-ray viewed and independently interpreted by me, no pneumonia, pnx, effusion.  Urine pending.  Urine c/w infection.  On further discussion with patient, she reports that she was so weak today that she is unable to walk, which is her baseline.  She is feeling slightly better now.  However I am concerned about her confusion, weakness, anemia, and urine infection.  Discussed  with her son, who is her primary caretaker, who does not feel she is safe to go home.  Patient does not feel comfortable going home either.  As such, will call for admission.  Discussed with Dr. Bridgett Larsson from [facility], patient to be admitted    Franchot Heidelberg, PA-C 05/11/21 2003    Regan Lemming, MD 05/11/21 2012

## 2021-05-11 NOTE — Assessment & Plan Note (Signed)
Stable.  Baseline creatinine approximately 1.3-1.5.

## 2021-05-11 NOTE — Assessment & Plan Note (Addendum)
-   Looking at trend, baseline more likely around 8 to 8.5 g/dL.  Slightly under baseline at 7.6 g/dL on admission with no reported blood loss.  May be some bone marrow suppression from acute illness - Hemoglobin is improved this morning, 8.1 g/dL.  Hold off on further work-up at this time - Iron stores are low but holding off on repletion in setting of possible infection.  Could discharge with at least oral supplementation

## 2021-05-11 NOTE — ED Notes (Signed)
Transport arrived for pt and pt transported to the floor

## 2021-05-11 NOTE — Assessment & Plan Note (Addendum)
Patient with sinus bradycardia with heart rates in the mid 40s.  Patient on Lopressor at home.  Hold Lopressor.  Patient complaining of weakness.  Bradycardic could be source for weakness.

## 2021-05-12 ENCOUNTER — Encounter (HOSPITAL_COMMUNITY): Payer: Self-pay | Admitting: Internal Medicine

## 2021-05-12 DIAGNOSIS — N3000 Acute cystitis without hematuria: Secondary | ICD-10-CM | POA: Diagnosis not present

## 2021-05-12 DIAGNOSIS — D649 Anemia, unspecified: Secondary | ICD-10-CM | POA: Diagnosis not present

## 2021-05-12 LAB — MAGNESIUM: Magnesium: 2.1 mg/dL (ref 1.7–2.4)

## 2021-05-12 LAB — COMPREHENSIVE METABOLIC PANEL
ALT: 11 U/L (ref 0–44)
AST: 18 U/L (ref 15–41)
Albumin: 2.7 g/dL — ABNORMAL LOW (ref 3.5–5.0)
Alkaline Phosphatase: 58 U/L (ref 38–126)
Anion gap: 7 (ref 5–15)
BUN: 12 mg/dL (ref 8–23)
CO2: 23 mmol/L (ref 22–32)
Calcium: 8.2 mg/dL — ABNORMAL LOW (ref 8.9–10.3)
Chloride: 104 mmol/L (ref 98–111)
Creatinine, Ser: 1.19 mg/dL — ABNORMAL HIGH (ref 0.44–1.00)
GFR, Estimated: 47 mL/min — ABNORMAL LOW (ref >60)
Glucose, Bld: 81 mg/dL (ref 70–99)
Potassium: 3.6 mmol/L (ref 3.5–5.1)
Sodium: 134 mmol/L — ABNORMAL LOW (ref 135–145)
Total Bilirubin: 0.3 mg/dL (ref 0.3–1.2)
Total Protein: 6.3 g/dL — ABNORMAL LOW (ref 6.5–8.1)

## 2021-05-12 LAB — CBC WITH DIFFERENTIAL/PLATELET
Abs Immature Granulocytes: 0.02 10*3/uL (ref 0.00–0.07)
Basophils Absolute: 0 10*3/uL (ref 0.0–0.1)
Basophils Relative: 1 %
Eosinophils Absolute: 0.2 10*3/uL (ref 0.0–0.5)
Eosinophils Relative: 5 %
HCT: 28.4 % — ABNORMAL LOW (ref 36.0–46.0)
Hemoglobin: 8.1 g/dL — ABNORMAL LOW (ref 12.0–15.0)
Immature Granulocytes: 0 %
Lymphocytes Relative: 29 %
Lymphs Abs: 1.4 10*3/uL (ref 0.7–4.0)
MCH: 23.8 pg — ABNORMAL LOW (ref 26.0–34.0)
MCHC: 28.5 g/dL — ABNORMAL LOW (ref 30.0–36.0)
MCV: 83.3 fL (ref 80.0–100.0)
Monocytes Absolute: 0.4 10*3/uL (ref 0.1–1.0)
Monocytes Relative: 8 %
Neutro Abs: 2.7 10*3/uL (ref 1.7–7.7)
Neutrophils Relative %: 57 %
Platelets: 283 10*3/uL (ref 150–400)
RBC: 3.41 MIL/uL — ABNORMAL LOW (ref 3.87–5.11)
RDW: 19.8 % — ABNORMAL HIGH (ref 11.5–15.5)
WBC: 4.8 10*3/uL (ref 4.0–10.5)
nRBC: 0 % (ref 0.0–0.2)

## 2021-05-12 NOTE — Plan of Care (Signed)
  Problem: Nutrition: Goal: Adequate nutrition will be maintained Outcome: Progressing   Problem: Safety: Goal: Ability to remain free from injury will improve Outcome: Progressing   

## 2021-05-12 NOTE — Evaluation (Signed)
Physical Therapy Evaluation Patient Details Name: [name] MRN: [id number] DOB: 26-Dec-1940 Today's Date: 05/12/2021  History of Present Illness  80 yo female with onset of fall at home in which legs became unexpectedly weak was admitted on 10/18, now found to have acute cystitis and hematuria, new sinus bradycardia, lethargic.  PMHx:  lung CA, HTN, COPD, chronic pain, obesity, CKD,  Clinical Impression  Pt was seen for mobility on RW first to get to [facility] and then to walk in the room.  Pt became a bit light headed initially but did not get positive orthostatics.  O2 sats were 98% and pt mainly demonstrated some difficulty with mild instability on RW.  Pt followed instructions to get her across the room and back to position up in chair comfortably.   Follow for acute PT goals, focusing on her tolerances for standing, her balance skills and to increase LE strength for support of all upright mobility.  Pt is motivated and wants to go home with son.  If pt is not going to have 24/7 care will need to go to rehab setting, but if she does, will have HHPT follow up as planned.         Recommendations for follow up therapy are one component of a multi-disciplinary discharge planning process, led by the attending physician.  Recommendations may be updated based on patient status, additional functional criteria and insurance authorization.  Follow Up Recommendations Home health PT;Supervision/Assistance - 24 hour    Equipment Recommendations  Rolling walker with 5" wheels (if needed)    Recommendations for Other Services       Precautions / Restrictions Precautions Precautions: Fall Precaution Comments: mild confusion Restrictions Weight Bearing Restrictions: No      Mobility  Bed Mobility Overal bed mobility: Needs Assistance Bed Mobility: Supine to Sit     Supine to sit: Min assist     General bed mobility comments: min assist to support trunk and control sit balance initially     Transfers Overall transfer level: Needs assistance Equipment used: Rolling walker (2 wheeled);1 person hand held assist Transfers: Sit to/from Stand Sit to Stand: Min assist         General transfer comment: min assist to power up and steady her  Ambulation/Gait Ambulation/Gait assistance: Min assist Gait Distance (Feet): 40 Feet Assistive device: Rolling walker (2 wheeled);1 person hand held assist Gait Pattern/deviations: Step-to pattern;Step-through pattern;Decreased stride length;Wide base of support;Trunk flexed Gait velocity: reduced   General Gait Details: mild unsteadiness, turns are wider  Science writer    Modified Rankin (Stroke Patients Only)       Balance Overall balance assessment: Needs assistance Sitting-balance support: Feet supported Sitting balance-Leahy Scale: Fair     Standing balance support: Bilateral upper extremity supported;During functional activity Standing balance-Leahy Scale: Poor                               Pertinent Vitals/Pain Pain Assessment: No/denies pain    Home Living Family/patient expects to be discharged to:: Private residence Living Arrangements: Children Available Help at Discharge: Family;Available 24 hours/day Type of Home: House Home Access: Stairs to enter Entrance Stairs-Rails: Right Entrance Stairs-Number of Steps: 3 Home Layout: One level Home Equipment: Walker - 2 wheels;Cane - single point;Bedside commode;Wheelchair - manual;Shower seat      Prior Function Level of Independence: Independent  Comments: reports she used rollator at times and when she fell was with it     Hand Dominance   Dominant Hand: Right    Extremity/Trunk Assessment   Upper Extremity Assessment Upper Extremity Assessment: Generalized weakness    Lower Extremity Assessment Lower Extremity Assessment: Generalized weakness    Cervical / Trunk Assessment Cervical /  Trunk Assessment: Kyphotic  Communication   Communication: No difficulties  Cognition Arousal/Alertness: Awake/alert Behavior During Therapy: WFL for tasks assessed/performed Overall Cognitive Status: Impaired/Different from baseline Area of Impairment: Problem solving;Awareness;Safety/judgement;Following commands                       Following Commands: Follows one step commands with increased time Safety/Judgement: Decreased awareness of deficits Awareness: Intellectual Problem Solving: Requires verbal cues;Requires tactile cues General Comments: pt is a bit impulsive with walker without instructions, but also light headed      General Comments General comments (skin integrity, edema, etc.): pt was seen for mobility with RW in room and used [facility] for brief time to be sure her head was going to be steady.  Pt agreed to sit OOB afterward    Exercises     Assessment/Plan    PT Assessment Patient needs continued PT services  PT Problem List Decreased strength;Decreased range of motion;Decreased activity tolerance;Decreased balance;Decreased mobility;Decreased coordination;Decreased cognition;Decreased safety awareness;Decreased skin integrity       PT Treatment Interventions DME instruction;Gait training;Stair training;Functional mobility training;Therapeutic activities;Therapeutic exercise;Balance training;Neuromuscular re-education;Patient/family education    PT Goals (Current goals can be found in the Care Plan section)  Acute Rehab PT Goals Patient Stated Goal: to go home with her son PT Goal Formulation: With patient Time For Goal Achievement: 05/26/21 Potential to Achieve Goals: Good    Frequency Min 3X/week   Barriers to discharge Inaccessible home environment;Decreased caregiver support home with stairs to enter house, has son there    Co-evaluation               AM-PAC PT "6 Clicks" Mobility  Outcome Measure Help needed turning from your back to  your side while in a flat bed without using bedrails?: A Little Help needed moving from lying on your back to sitting on the side of a flat bed without using bedrails?: A Little Help needed moving to and from a bed to a chair (including a wheelchair)?: A Little Help needed standing up from a chair using your arms (e.g., wheelchair or bedside chair)?: A Little Help needed to walk in hospital room?: A Little Help needed climbing 3-5 steps with a railing? : Total 6 Click Score: 16    End of Session Equipment Utilized During Treatment: Gait belt Activity Tolerance: Patient tolerated treatment well;Patient limited by fatigue Patient left: in chair;with call bell/phone within reach;with chair alarm set Nurse Communication: Mobility status PT Visit Diagnosis: Unsteadiness on feet (R26.81);Muscle weakness (generalized) (M62.81);History of falling (Z91.81);Difficulty in walking, not elsewhere classified (R26.2)    Time: 5631-4970 PT Time Calculation (min) (ACUTE ONLY): 29 min   Charges:   PT Evaluation $PT Eval Moderate Complexity: 1 Mod PT Treatments $Gait Training: 8-22 mins       Ramond Dial 05/12/2021, 4:58 PM  Mee Hives, PT PhD Acute Rehab Dept. Number: [place] [facility] [place]

## 2021-05-12 NOTE — Progress Notes (Signed)
Progress Note    [name]   ZLD:[id number]  DOB: Aug 29, 1940  DOA: 05/11/2021     0 Date of Service: 05/12/2021   Clinical Course 80 year old female with a history of COPD, hypertension, prior history of lung cancer, currently does not use home oxygen, obesity, chronic pain syndrome presented to the ER via EMS.   Patient has stated that she felt weak at home when she got up to use the bathroom and she slid to the floor.  She had denied striking her head. Her son also reported on admission that the patient has been confused for approximately 3 days prior to admission. She underwent work-up on arrival.  Only perceived abnormalities included a urinalysis positive for small leukocyte esterase and positive nitrites.  Rare bacteria and only 0-5 WBCs.  She was started on Rocephin for presumed UTI. Hemoglobin was also noted to be lower than baseline, 7.6 g/dL on admission.  Assessment and Plan * Acute cystitis without hematuria - Urinalysis noted with nitrites and leukocyte esterase but low WBC and rare bacteria - Weakness may be multifactorial on admission from UTI and/or anemia - Continue Rocephin and follow-up cultures  Acute on chronic anemia - Looking at trend, baseline more likely around 8 to 8.5 g/dL.  Slightly under baseline at 7.6 g/dL on admission with no reported blood loss.  May be some bone marrow suppression from acute illness - Hemoglobin is improved this morning, 8.1 g/dL.  Hold off on further work-up at this time - Iron stores are low but holding off on repletion in setting of possible infection.  Could discharge with at least oral supplementation  Sinus bradycardia Patient with sinus bradycardia with heart rates in the mid 40s.  Patient on Lopressor at home.  Hold Lopressor.  Patient complaining of weakness.  Bradycardic could be source for weakness.  CKD (chronic kidney disease), stage III (HCC) - baseline SCr 1.3-1.5 Stable.  Baseline creatinine approximately  1.3-1.5.  Essential hypertension Stable.  We will hold Lopressor due to bradycardia.  May need to increase her Norvasc.  Patient reportedly hypotensive when EMS arrived.  She did receive fluid bolus.  Hold her home blood pressure medications for now.  Monitor blood pressure.  COPD (chronic obstructive pulmonary disease) (HCC) Stable.  Currently not exacerbated.    Subjective:  Seen this morning resting in bed.  Still appears lethargic and fatigued but states that she has had some improvement since admission.  Reviewed findings of her anemia panel and blood work and stated we would continue hospitalization 1 more night for monitoring of cultures, hemoglobin, and blood pressure.  Objective Vitals:   05/12/21 0411 05/12/21 0829 05/12/21 0842 05/12/21 1224  BP: (!) 141/66 (!) 157/76 (!) 150/66 131/85  Pulse: 61 74 64 71  Resp:  14 14 16   Temp: 98.1 F (36.7 C) 98 F (36.7 C) 98.8 F (37.1 C) 98.1 F (36.7 C)  TempSrc: Oral Oral Oral Oral  SpO2: 98% 99% 100% 100%      Vital signs were reviewed and unremarkable.   Exam Physical Exam Constitutional:      General: She is not in acute distress.    Appearance: Normal appearance.  HENT:     Head: Normocephalic and atraumatic.     Mouth/Throat:     Mouth: Mucous membranes are moist.  Eyes:     Extraocular Movements: Extraocular movements intact.  Cardiovascular:     Rate and Rhythm: Normal rate and regular rhythm.  Pulmonary:     Effort:  Pulmonary effort is normal.     Breath sounds: Normal breath sounds.  Abdominal:     General: Bowel sounds are normal. There is no distension.     Palpations: Abdomen is soft.     Tenderness: There is no abdominal tenderness.  Musculoskeletal:        General: Normal range of motion.     Cervical back: Normal range of motion and neck supple.  Skin:    General: Skin is warm and dry.  Neurological:     General: No focal deficit present.     Mental Status: She is alert.  Psychiatric:         Mood and Affect: Mood normal.        Behavior: Behavior normal.     Labs / Other Information My review of labs, imaging, notes and other tests shows no new significant findings.    Disposition Plan: Status is: Observation  The patient will require care spanning > 2 midnights and should be moved to inpatient because: Ongoing monitoring of hemoglobin, heart rate, cultures    Time spent: Greater than 50% of the 35 minute visit was spent in counseling/coordination of care for the patient as laid out in the A&P.  Dwyane Dee, MD [facility] 05/12/2021, 1:03 PM

## 2021-05-13 DIAGNOSIS — N3000 Acute cystitis without hematuria: Secondary | ICD-10-CM | POA: Diagnosis not present

## 2021-05-13 LAB — CBC WITH DIFFERENTIAL/PLATELET
Abs Immature Granulocytes: 0.02 10*3/uL (ref 0.00–0.07)
Basophils Absolute: 0 10*3/uL (ref 0.0–0.1)
Basophils Relative: 1 %
Eosinophils Absolute: 0.2 10*3/uL (ref 0.0–0.5)
Eosinophils Relative: 4 %
HCT: 27.8 % — ABNORMAL LOW (ref 36.0–46.0)
Hemoglobin: 8.3 g/dL — ABNORMAL LOW (ref 12.0–15.0)
Immature Granulocytes: 0 %
Lymphocytes Relative: 28 %
Lymphs Abs: 1.6 10*3/uL (ref 0.7–4.0)
MCH: 24.3 pg — ABNORMAL LOW (ref 26.0–34.0)
MCHC: 29.9 g/dL — ABNORMAL LOW (ref 30.0–36.0)
MCV: 81.5 fL (ref 80.0–100.0)
Monocytes Absolute: 0.4 10*3/uL (ref 0.1–1.0)
Monocytes Relative: 7 %
Neutro Abs: 3.4 10*3/uL (ref 1.7–7.7)
Neutrophils Relative %: 60 %
Platelets: 297 10*3/uL (ref 150–400)
RBC: 3.41 MIL/uL — ABNORMAL LOW (ref 3.87–5.11)
RDW: 19.6 % — ABNORMAL HIGH (ref 11.5–15.5)
WBC: 5.7 10*3/uL (ref 4.0–10.5)
nRBC: 0 % (ref 0.0–0.2)

## 2021-05-13 LAB — BASIC METABOLIC PANEL
Anion gap: 10 (ref 5–15)
BUN: 12 mg/dL (ref 8–23)
CO2: 22 mmol/L (ref 22–32)
Calcium: 8.2 mg/dL — ABNORMAL LOW (ref 8.9–10.3)
Chloride: 107 mmol/L (ref 98–111)
Creatinine, Ser: 1.16 mg/dL — ABNORMAL HIGH (ref 0.44–1.00)
GFR, Estimated: 48 mL/min — ABNORMAL LOW (ref >60)
Glucose, Bld: 83 mg/dL (ref 70–99)
Potassium: 3.6 mmol/L (ref 3.5–5.1)
Sodium: 139 mmol/L (ref 135–145)

## 2021-05-13 LAB — MAGNESIUM: Magnesium: 1.9 mg/dL (ref 1.7–2.4)

## 2021-05-13 MED ORDER — CEPHALEXIN 500 MG PO CAPS: 500.0000 mg | ORAL_CAPSULE | Freq: Three times a day (TID) | ORAL | 0 refills | Status: AC

## 2021-05-13 MED ORDER — AMLODIPINE BESYLATE 5 MG PO TABS
5.0000 mg | ORAL_TABLET | Freq: Every day | ORAL | Status: DC
  Administered 2021-05-13: 5 mg via ORAL
  Filled 2021-05-13: qty 1

## 2021-05-13 MED ORDER — ASPIRIN 81 MG PO TBEC: 81.0000 mg | DELAYED_RELEASE_TABLET | Freq: Every day | ORAL | 11 refills | Status: DC

## 2021-05-13 NOTE — Progress Notes (Signed)
Attempted to contact patient's son with no answer to notify of patient's discharge.  Message left on voicemail.  Will continue to try to contact him.

## 2021-05-13 NOTE — TOC Initial Note (Addendum)
Transition of Care [facility]) - Initial/Assessment Note    Patient Details  Name: [name] MRN: [id number] Date of Birth: Dec 12, 1940  Transition of [facility]) CM/SW Contact:    Marilu Favre, RN Phone Number: 05/13/2021, 10:46 AM  Clinical Narrative:                 Talked to patient at bedside and to son [name] via phone.   Discussed PT recommendations for HHPT with 24/7 supervision ( provided by family).   Patient will have 24/7 supervision.   She has used [facility] last  and would like to have their services again. Referral given to [facility] with [facility]. Ramond Marrow will review the referral and call NCM back with determination.   Ramond Marrow returned call, per records patient's last [facility] agency was [facility]. [facility] unable to accept due to staffing.  NCM messaged Stac[facility] with [facility] awaiting response . [facility] unable to accept   [facility] with [facility] . Tommi Rumps accepted   Patient already has a rolling walker at home.   Expected Discharge Plan: [place]     Patient Goals and CMS Choice Patient states their goals for this hospitalization and ongoing recovery are:: to return home CMS Medicare.gov Compare Post Acute Care list provided to:: Patient Choice offered to / list presented to : Patient, Adult Children  Expected Discharge Plan and Services Expected Discharge Plan: [place]   Discharge Planning Services: CM Consult Post Acute Care Choice: [place] arrangements for the past 2 months: Single Family Home                 DME Arranged: N/A         HH Arranged: PT HH Agency: [facility] ([place]) Date HH Agency Contacted: 05/13/21 Time HH Agency Contacted: 1045 Representative spoke with at [place]: [place] Arrangements/Services Living arrangements for the past 2 months: [place] with:: Adult Children Patient language and need for interpreter reviewed:: Yes Do you  feel safe going back to the place where you live?: Yes      Need for Family Participation in Patient Care: Yes (Comment) Care giver support system in place?: Yes (comment) Current home services: DME Criminal Activity/Legal Involvement Pertinent to Current Situation/Hospitalization: No - Comment as needed  Activities of Daily Living Home Assistive Devices/Equipment: Eyeglasses, Dentures (specify type), Walker (specify type) ADL Screening (condition at time of admission) Patient's cognitive ability adequate to safely complete daily activities?: Yes Is the patient deaf or have difficulty hearing?: Yes Does the patient have difficulty seeing, even when wearing glasses/contacts?: No Does the patient have difficulty concentrating, remembering, or making decisions?: No Patient able to express need for assistance with ADLs?: Yes Does the patient have difficulty dressing or bathing?: No Independently performs ADLs?: Yes (appropriate for developmental age) Does the patient have difficulty walking or climbing stairs?: Yes Weakness of Legs: Both Weakness of Arms/Hands: None  Permission Sought/Granted   Permission granted to share information with : Yes, Verbal Permission Granted  Share Information with NAME: son [name]  Permission granted to share info w AGENCY: [facility]        Emotional Assessment Appearance:: Appears stated age Attitude/Demeanor/Rapport: Engaged Affect (typically observed): Accepting Orientation: : Oriented to Self, Oriented to Place, Oriented to  Time, Oriented to Situation Alcohol / Substance Use: Not Applicable Psych Involvement: No (comment)  Admission diagnosis:  Acute cystitis without hematuria [N30.00] Anemia, unspecified type [  D64.9] Patient Active Problem List   Diagnosis Date Noted   Acute cystitis without hematuria 05/11/2021   Sinus bradycardia 05/11/2021   Acute on chronic anemia 05/11/2021   Closed fracture of left distal radius 06/13/2020    Vitamin D insufficiency 06/13/2020   Greater trochanter fracture ([place]), left 06/11/2020   S/P total knee replacement 05/27/2019   DDD (degenerative disc disease), 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   Bilateral renal cysts 05/21/2018   Hiatal hernia 05/21/2018   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   Thoracic radiculitis (Bilateral) (L>R) 05/03/2018   Chronic thoracic back pain (Left) 05/03/2018   Injury of peripheral nerves of thorax, sequela 03/22/2018   Chest pain 03/21/2018   Rib pain (Left) 03/21/2018   Chronic rib pain (Right) 03/20/2018   DDD (degenerative disc disease), lumbar 03/20/2018   Chronic fracture of pubic ramus, sequela (Right) 03/20/2018   History of femoral neck fracture, sequela (Left) 03/20/2018    Class: History of   Lumbar facet arthropathy (Bilateral) 03/20/2018   Lumbar facet syndrome (Bilateral) 03/20/2018   Osteoarthritis of facet joint of lumbar spine 03/20/2018   Spondylosis without myelopathy or radiculopathy, lumbosacral region 03/20/2018   Other specified dorsopathies, sacral and sacrococcygeal region 03/20/2018   Chronic pain syndrome 02/26/2018   Cancer related pain 02/26/2018   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   Chronic low back pain (Secondary Area of Pain) (Bilateral) 02/26/2018   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   Chronic lower extremity pain (Tertiary Area of Pain) (Bilateral) (R>L) 02/26/2018   Chronic hip pain (Fourth Area of Pain) (Bilateral) (R>L) 02/26/2018   Neurogenic pain 02/26/2018   Pharmacologic therapy 02/26/2018   Problems influencing health status 02/26/2018   Long term prescription benzodiazepine use 02/26/2018   Long term prescription opiate use 02/26/2018   History of uterine cancer 12/08/2017   Generalized anxiety disorder 12/08/2017   GERD (gastroesophageal reflux disease) 12/08/2017   Substernal chest pain 12/08/2017   Chronic sacroiliac joint pain 06/21/2017    Disorder of skeletal system 06/21/2017   Other long term (current) drug therapy 06/21/2017   Other specified health status 06/21/2017   Chronic chest wall pain (Primary Area of Pain) 06/21/2017   Chronic post-thoracotomy pain 03/03/2017   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   Adenocarcinoma of right lung, stage 1 ([place]) 07/28/2016   S/P lobectomy of lung 05/09/2016   DVT (deep venous thrombosis) ([place]) 04/26/2016   Heart murmur    Essential hypertension 08/29/2015   Hypercholesterolemia 08/29/2015   CKD (chronic kidney disease), stage III (HCC) - baseline SCr 1.3-1.5 08/29/2015   COPD (chronic obstructive pulmonary disease) ([place]) 04/27/2015   Infected sebaceous cyst 08/16/2011   PCP:  Wenda Low, MD Pharmacy:   [facility], [place], [address], [place] [address] Phone: [phone] Fax: [phone]  [facility] - [place], [place] - [address] [place] [address] Phone: [phone] Fax: [phone]     Social Determinants of Health (SDOH) Interventions    Readmission Risk Interventions No flowsheet data found.

## 2021-05-13 NOTE — Discharge Summary (Signed)
Physician Discharge Summary  [name] PJK:[id number] DOB: [birth year] DOA: 05/11/2021  PCP: Wenda Low, MD  Admit date: 05/11/2021 Discharge date: 05/13/2021  Admitted From: home Disposition:  [facility]  Recommendations for Outpatient Follow-up:  Follow up with PCP in 1-2 weeks Please obtain BMP/CBC in one week  Home Health:Yes  Equipment/Devices: Yes  Discharge Condition: Stable Code Status:   Code Status: Full Code Diet recommendation:  Diet Order             Diet regular Room service appropriate? Yes; Fluid consistency: Thin  Diet effective now                    Brief/Interim Summary: 80 year old female with a history of COPD, hypertension, prior history of lung cancer, currently does not use home oxygen, obesity, chronic pain syndrome presented to the ER via EMS.   Patient has stated that she felt weak at home when she got up to use the bathroom and she slid to the floor.  She had denied striking her head. Her son also reported on admission that the patient has been confused for approximately 3 days prior to admission.She underwent work-up on arrival.  Only perceived abnormalities included a urinalysis positive for small leukocyte esterase and positive nitrites.  Rare bacteria and only 0-5 WBCs.  She was started on Rocephin for presumed UTI.Hemoglobin was also noted to be lower than baseline, 7.6 g/dL on admission. Pt was admitted. She was treated with antibiotics.  Urinary symptoms minimal/none at this time blood culture negative.  Hemoglobin overall stable Hemoccult test was negative.  Iron work-up with low iron at 18, saturation 5%.  She will go home on iron supplementation.  Follow-up with PCP with CBC check in a week.  Seen by PT OT advised home health PT.  She is medically stable for discharge.  She feels well and wants to go home today and son lives with her.  Will discharge on short course of oral antibiotics for cystitis.  She had bradycardia on admission which is  resolved on holding metoprolol.  Multiple discontinued continue her amlodipine for blood pressure management.  Discharge Diagnoses:  Acute cystitis without hematuria: Acute cystitis at this time given IV antibiotics here will change to Keflex f to complete 3 days course COPD--not in exacerbation  Essential hypertension: Continue amlodipine resumed today.  Follow-up with PCP of metoprolol due to bradycardia CKD (chronic kidney disease), stage III (HCC) - baseline SCr 1.3-1.5: Renal function stable Sinus bradycardia-Metropol discontinued.  Heart rate is stable Acute on chronic anemia: Stable 8.3 g.  Has iron deficiency continue iron supplementation follow-up with PCP, continue to follow-up with GI for age-appropriate cancer screening. Recent Labs  Lab 05/11/21 1418 05/12/21 0137 05/13/21 0208  HGB 7.6* 8.1* 8.3*  HCT 26.6* 28.4* 27.8*    Deconditioning/debility continue PT OT and home health.  Consults: toc  Subjective: Alert awake oriented not in distress.  Feels ready for home today.  No urinary symptoms nausea vomiting.  Able to walk well with PT OT Discharge Exam: Vitals:   05/13/21 0250 05/13/21 0828  BP: (!) 187/73 (!) 175/73  Pulse: 78 78  Resp:  16  Temp: 98.4 F (36.9 C) 98.6 F (37 C)  SpO2: 97% 98%   General: Pt is alert, awake, not in acute distress Cardiovascular: RRR, S1/S2 +, no rubs, no gallops Respiratory: CTA bilaterally, no wheezing, no rhonchi Abdominal: Soft, NT, ND, bowel sounds + Extremities: no edema, no cyanosis  Discharge Instructions  Discharge  Instructions     Discharge instructions   Complete by: As directed    Check CBC in 1 week, continue work-up for iron deficiency anemia may need GI evaluation  Please call call MD or return to ER for similar or worsening recurring problem that brought you to hospital or if any fever,nausea/vomiting,abdominal pain, uncontrolled pain, chest pain,  shortness of breath or any other alarming  symptoms.  Please follow-up your doctor as instructed in a week time and call the office for appointment.  Please avoid alcohol, smoking, or any other illicit substance and maintain healthy habits including taking your regular medications as prescribed.  You were cared for by a hospitalist during your hospital stay. If you have any questions about your discharge medications or the care you received while you were in the hospital after you are discharged, you can call the unit and ask to speak with the hospitalist on call if the hospitalist that took care of you is not available.  Once you are discharged, your primary care physician will handle any further medical issues. Please note that NO REFILLS for any discharge medications will be authorized once you are discharged, as it is imperative that you return to your primary care physician (or establish a relationship with a primary care physician if you do not have one) for your aftercare needs so that they can reassess your need for medications and monitor your lab values   Increase activity slowly   Complete by: As directed       Allergies as of 05/13/2021       Reactions   Boniva [ibandronate Sodium] Other (See Comments)   UNSPECIFIED REACTION    Lyrica [pregabalin] Nausea And Vomiting   Dizziness and blurred vision as well   Levofloxacin    Pt states she isn't sure         Medication List     STOP taking these medications    metoprolol tartrate 25 MG tablet Commonly known as: LOPRESSOR       TAKE these medications    acetaminophen 500 MG tablet Commonly known as: TYLENOL Take 2 tablets (1,000 mg total) by mouth every 6 (six) hours. What changed:  when to take this reasons to take this   albuterol 108 (90 Base) MCG/ACT inhaler Commonly known as: VENTOLIN HFA Inhale 2 puffs into the lungs every 6 (six) hours as needed. What changed:  when to take this reasons to take this   aluminum-magnesium  hydroxide-simethicone 322-025-42 MG/5ML Susp Commonly known as: MAALOX Take 30 mLs by mouth 3 (three) times daily as needed (indigestion).   amLODipine 5 MG tablet Commonly known as: NORVASC Take 5 mg by mouth daily.   aspirin 81 MG EC tablet Take 1 tablet (81 mg total) by mouth daily. Swallow whole. Start taking on: May 14, 2021 What changed:  medication strength how much to take when to take this additional instructions   atorvastatin 20 MG tablet Commonly known as: LIPITOR Take 20 mg by mouth every evening.   cephALEXin 500 MG capsule Commonly known as: KEFLEX Take 1 capsule (500 mg total) by mouth 3 (three) times daily for 2 days.   diazepam 5 MG tablet Commonly known as: VALIUM Take 0.5 tablets (2.5 mg total) by mouth every 8 (eight) hours as needed for anxiety. Must last 30 days.   ferrous sulfate 325 (65 FE) MG tablet Take 1 tablet (325 mg total) by mouth daily with breakfast.   furosemide 20 MG tablet Commonly known  as: LASIX Take 20 mg by mouth every Monday, Wednesday, and Friday. Monday and Wednesday   gabapentin 400 MG capsule Commonly known as: NEURONTIN Take 1 capsule (400 mg total) by mouth 4 (four) times daily. What changed: when to take this   lisinopril 10 MG tablet Commonly known as: ZESTRIL Take 10 mg by mouth every morning.   meclizine 25 MG tablet Commonly known as: ANTIVERT Take 1 tablet (25 mg total) by mouth 3 (three) times daily as needed for dizziness.   meloxicam 7.5 MG tablet Commonly known as: MOBIC Take 7.5 mg by mouth daily.   metoCLOPramide 5 MG tablet Commonly known as: REGLAN Take 5 mg by mouth 2 (two) times daily.   mirtazapine 7.5 MG tablet Commonly known as: REMERON Take 7.5 mg by mouth at bedtime.   multivitamin with minerals Tabs tablet Take 1 tablet by mouth daily.   oxybutynin 5 MG 24 hr tablet Commonly known as: DITROPAN-XL Take 5 mg by mouth at bedtime.   pantoprazole 40 MG tablet Commonly known as:  PROTONIX Take 1 tablet (40 mg total) by mouth daily at 6 (six) AM.   raloxifene 60 MG tablet Commonly known as: EVISTA Take 60 mg by mouth daily.   sertraline 100 MG tablet Commonly known as: ZOLOFT Take 100 mg by mouth in the morning and at bedtime.   tiZANidine 2 MG tablet Commonly known as: ZANAFLEX Take 1 tablet (2 mg total) by mouth every 6 (six) hours as needed. What changed: reasons to take this   traMADol 50 MG tablet Commonly known as: ULTRAM Take 1 tablet (50 mg total) by mouth 3 (three) times daily as needed for moderate pain.   Trelegy Ellipta 100-62.5-25 MCG/ACT Aepb Generic drug: Fluticasone-Umeclidin-Vilant Inhale 1 puff into the lungs daily.        Follow-up Information     Wenda Low, MD Follow up.   Specialty: Internal Medicine Why: Check CBC in 1 week, continue work-up for iron deficiency anemia may need GI evaluation Contact information: [address] [address] [phone]         Lelon Perla, MD .   Specialty: Cardiology Contact information: [address] [place] [address] [phone]                Allergies  Allergen Reactions   Boniva [Ibandronate Sodium] Other (See Comments)    UNSPECIFIED REACTION    Lyrica [Pregabalin] Nausea And Vomiting    Dizziness and blurred vision as well   Levofloxacin     Pt states she isn't sure     The results of significant diagnostics from this hospitalization (including imaging, microbiology, ancillary and laboratory) are listed below for reference.    Microbiology: Recent Results (from the past 240 hour(s))  Blood Cultures (routine x 2)     Status: None (Preliminary result)   Collection Time: 05/11/21  2:27 PM   Specimen: BLOOD RIGHT FOREARM  Result Value Ref Range Status   Specimen Description BLOOD RIGHT FOREARM  Final   Special Requests   Final    BOTTLES DRAWN AEROBIC ONLY Blood Culture results may not be optimal due to an  inadequate volume of blood received in culture bottles   Culture   Final    NO GROWTH < 24 HOURS Performed at [facility], [place] [address], [place], [place] [address]    Report Status PENDING  Incomplete  Blood Cultures (routine x 2)  Status: None (Preliminary result)   Collection Time: 05/11/21  2:29 PM   Specimen: BLOOD LEFT ARM  Result Value Ref Range Status   Specimen Description BLOOD LEFT ARM  Final   Special Requests   Final    BOTTLES DRAWN AEROBIC AND ANAEROBIC Blood Culture adequate volume   Culture   Final    NO GROWTH < 24 HOURS Performed at [facility], [place] [address], [place], [place] [address]    Report Status PENDING  Incomplete  Resp Panel by RT-PCR (Flu A&B, Covid) Nasopharyngeal Swab     Status: None   Collection Time: 05/11/21  3:47 PM   Specimen: Nasopharyngeal Swab; Nasopharyngeal(NP) swabs in vial transport medium  Result Value Ref Range Status   SARS Coronavirus 2 by RT PCR NEGATIVE NEGATIVE Final    Comment: (NOTE) SARS-CoV-2 target nucleic acids are NOT DETECTED.  The SARS-CoV-2 RNA is generally detectable in upper respiratory specimens during the acute phase of infection. The lowest concentration of SARS-CoV-2 viral copies this assay can detect is 138 copies/mL. A negative result does not preclude SARS-Cov-2 infection and should not be used as the sole basis for treatment or other patient management decisions. A negative result may occur with  improper specimen collection/handling, submission of specimen other than nasopharyngeal swab, presence of viral mutation(s) within the areas targeted by this assay, and inadequate number of viral copies(<138 copies/mL). A negative result must be combined with clinical observations, patient history, and epidemiological information. The expected result is Negative.  Fact Sheet for Patients:  EntrepreneurPulse.com.au  Fact Sheet for Healthcare Providers:   IncredibleEmployment.be  This test is no t yet approved or cleared by the [place] FDA and  has been authorized for detection and/or diagnosis of SARS-CoV-2 by FDA under an Emergency Use Authorization (EUA). This EUA will remain  in effect (meaning this test can be used) for the duration of the COVID-19 declaration under Section 564(b)(1) of the Act, 21 U.S.C.section 360bbb-3(b)(1), unless the authorization is terminated  or revoked sooner.       Influenza A by PCR NEGATIVE NEGATIVE Final   Influenza B by PCR NEGATIVE NEGATIVE Final    Comment: (NOTE) The Xpert Xpress SARS-CoV-2/FLU/RSV plus assay is intended as an aid in the diagnosis of influenza from Nasopharyngeal swab specimens and should not be used as a sole basis for treatment. Nasal washings and aspirates are unacceptable for Xpert Xpress SARS-CoV-2/FLU/RSV testing.  Fact Sheet for Patients: EntrepreneurPulse.com.au  Fact Sheet for Healthcare Providers: IncredibleEmployment.be  This test is not yet approved or cleared by the [place] FDA and has been authorized for detection and/or diagnosis of SARS-CoV-2 by FDA under an Emergency Use Authorization (EUA). This EUA will remain in effect (meaning this test can be used) for the duration of the COVID-19 declaration under Section 564(b)(1) of the Act, 21 U.S.C. section 360bbb-3(b)(1), unless the authorization is terminated or revoked.  Performed at [facility], [place] [address], [place],  [address]     Procedures/Studies: CT HEAD WO CONTRAST  Result Date: 05/11/2021 CLINICAL DATA:  Mental status changes of unknown cause. EXAM: CT HEAD WITHOUT CONTRAST TECHNIQUE: Contiguous axial images were obtained from the base of the skull through the vertex without intravenous contrast. COMPARISON:  CT and MRI studies from April of this year. FINDINGS: Brain: Extensive chronic small-vessel ischemic changes  are seen throughout the pons, thalami and cerebral hemispheric white matter. No sign of acute infarction, mass lesion, hemorrhage, hydrocephalus or extra-axial collection. Vascular:  There is atherosclerotic calcification of the major vessels at the base of the brain. Skull: Normal Sinuses/Orbits: Sinuses are clear. Bilateral coloboma or severe axial myopia. Other: None IMPRESSION: No acute finding by CT. Advanced chronic small-vessel ischemic changes affecting the pons, thalami and cerebral hemispheric white matter. Chronic coloboma or axial myopia. Electronically Signed   By: Nelson Chimes M.D.   On: 05/11/2021 15:35   DG Chest Port 1 View  Result Date: 05/11/2021 CLINICAL DATA:  Altered mental status. EXAM: PORTABLE CHEST 1 VIEW COMPARISON:  None. FINDINGS: Heart is enlarged. Chronic interstitial changes are stable. No edema or effusion is present. No focal airspace disease is present. Atherosclerotic changes are noted in the aortic arch. Remote left-sided rib fractures again noted. IMPRESSION: 1. Stable cardiomegaly without failure. 2. No acute cardiopulmonary disease. 3. Stable chronic interstitial changes. Electronically Signed   By: San Morelle M.D.   On: 05/11/2021 15:07    Labs: BNP (last 3 results) No results for input(s): BNP in the last 8760 hours. Basic Metabolic Panel: Recent Labs  Lab 05/11/21 1418 05/12/21 0137 05/13/21 0208  NA 137 134* 139  K 4.4 3.6 3.6  CL 105 104 107  CO2 25 23 22   GLUCOSE 126* 81 83  BUN 13 12 12   CREATININE 1.26* 1.19* 1.16*  CALCIUM 8.1* 8.2* 8.2*  MG  --  2.1 1.9   Liver Function Tests: Recent Labs  Lab 05/11/21 1418 05/12/21 0137  AST 18 18  ALT 11 11  ALKPHOS 59 58  BILITOT 0.4 0.3  PROT 6.0* 6.3*  ALBUMIN 2.6* 2.7*   No results for input(s): LIPASE, AMYLASE in the last 168 hours. Recent Labs  Lab 05/11/21 1418  AMMONIA <10   CBC: Recent Labs  Lab 05/11/21 1418 05/12/21 0137 05/13/21 0208  WBC 5.8 4.8 5.7  NEUTROABS  3.8 2.7 3.4  HGB 7.6* 8.1* 8.3*  HCT 26.6* 28.4* 27.8*  MCV 83.6 83.3 81.5  PLT 275 283 297   Cardiac Enzymes: No results for input(s): CKTOTAL, CKMB, CKMBINDEX, TROPONINI in the last 168 hours. BNP: Invalid input(s): POCBNP CBG: Recent Labs  Lab 05/11/21 1401  GLUCAP 120*   D-Dimer No results for input(s): DDIMER in the last 72 hours. Hgb A1c No results for input(s): HGBA1C in the last 72 hours. Lipid Profile No results for input(s): CHOL, HDL, LDLCALC, TRIG, CHOLHDL, LDLDIRECT in the last 72 hours. Thyroid function studies No results for input(s): TSH, T4TOTAL, T3FREE, THYROIDAB in the last 72 hours.  Invalid input(s): FREET3 Anemia work up Recent Labs    05/11/21 2146  TIBC 342  IRON 18*   Urinalysis    Component Value Date/Time   COLORURINE YELLOW 05/11/2021 [place] 05/11/2021 1733   LABSPEC 1.004 (L) 05/11/2021 1733   PHURINE 7.0 05/11/2021 [place] 05/11/2021 1733   HGBUR NEGATIVE 05/11/2021 [place] 05/11/2021 [place] 05/11/2021 1733   PROTEINUR NEGATIVE 05/11/2021 1733   UROBILINOGEN 0.2 04/27/2015 1229   NITRITE POSITIVE (A) 05/11/2021 1733   LEUKOCYTESUR SMALL (A) 05/11/2021 1733   Sepsis Labs Invalid input(s): PROCALCITONIN,  WBC,  LACTICIDVEN Microbiology Recent Results (from the past 240 hour(s))  Blood Cultures (routine x 2)     Status: None (Preliminary result)   Collection Time: 05/11/21  2:27 PM   Specimen: BLOOD RIGHT FOREARM  Result Value Ref Range Status   Specimen Description BLOOD RIGHT FOREARM  Final   Special Requests   Final  BOTTLES DRAWN AEROBIC ONLY Blood Culture results may not be optimal due to an inadequate volume of blood received in culture bottles   Culture   Final    NO GROWTH < 24 HOURS Performed at [facility] [place] [address], [place], [place] [address]    Report Status PENDING  Incomplete  Blood Cultures (routine x 2)     Status: None  (Preliminary result)   Collection Time: 05/11/21  2:29 PM   Specimen: BLOOD LEFT ARM  Result Value Ref Range Status   Specimen Description BLOOD LEFT ARM  Final   Special Requests   Final    BOTTLES DRAWN AEROBIC AND ANAEROBIC Blood Culture adequate volume   Culture   Final    NO GROWTH < 24 HOURS Performed at [facility], [place] [address], [place], [place] [address]    Report Status PENDING  Incomplete  Resp Panel by RT-PCR (Flu A&B, Covid) Nasopharyngeal Swab     Status: None   Collection Time: 05/11/21  3:47 PM   Specimen: Nasopharyngeal Swab; Nasopharyngeal(NP) swabs in vial transport medium  Result Value Ref Range Status   SARS Coronavirus 2 by RT PCR NEGATIVE NEGATIVE Final    Comment: (NOTE) SARS-CoV-2 target nucleic acids are NOT DETECTED.  The SARS-CoV-2 RNA is generally detectable in upper respiratory specimens during the acute phase of infection. The lowest concentration of SARS-CoV-2 viral copies this assay can detect is 138 copies/mL. A negative result does not preclude SARS-Cov-2 infection and should not be used as the sole basis for treatment or other patient management decisions. A negative result may occur with  improper specimen collection/handling, submission of specimen other than nasopharyngeal swab, presence of viral mutation(s) within the areas targeted by this assay, and inadequate number of viral copies(<138 copies/mL). A negative result must be combined with clinical observations, patient history, and epidemiological information. The expected result is Negative.  Fact Sheet for Patients:  EntrepreneurPulse.com.au  Fact Sheet for Healthcare Providers:  IncredibleEmployment.be  This test is no t yet approved or cleared by the [place] FDA and  has been authorized for detection and/or diagnosis of SARS-CoV-2 by FDA under an Emergency Use Authorization (EUA). This EUA will remain  in effect (meaning this test  can be used) for the duration of the COVID-19 declaration under Section 564(b)(1) of the Act, 21 U.S.C.section 360bbb-3(b)(1), unless the authorization is terminated  or revoked sooner.       Influenza A by PCR NEGATIVE NEGATIVE Final   Influenza B by PCR NEGATIVE NEGATIVE Final    Comment: (NOTE) The Xpert Xpress SARS-CoV-2/FLU/RSV plus assay is intended as an aid in the diagnosis of influenza from Nasopharyngeal swab specimens and should not be used as a sole basis for treatment. Nasal washings and aspirates are unacceptable for Xpert Xpress SARS-CoV-2/FLU/RSV testing.  Fact Sheet for Patients: EntrepreneurPulse.com.au  Fact Sheet for Healthcare Providers: IncredibleEmployment.be  This test is not yet approved or cleared by the [place] FDA and has been authorized for detection and/or diagnosis of SARS-CoV-2 by FDA under an Emergency Use Authorization (EUA). This EUA will remain in effect (meaning this test can be used) for the duration of the COVID-19 declaration under Section 564(b)(1) of the Act, 21 U.S.C. section 360bbb-3(b)(1), unless the authorization is terminated or revoked.  Performed at [facility], [place] [address] [place], [place] [address]      Time coordinating discharge: 25 minutes  SIGNED: Antonieta Pert, MD  [facility] 05/13/2021, 10:57  AM  If 7PM-7AM, please contact night-coverage [email or web address]

## 2021-05-13 NOTE — Plan of Care (Signed)

## 2021-05-16 LAB — CULTURE, BLOOD (ROUTINE X 2)
Culture: NO GROWTH
Culture: NO GROWTH
Special Requests: ADEQUATE

## 2021-05-17 DIAGNOSIS — N3000 Acute cystitis without hematuria: Secondary | ICD-10-CM | POA: Diagnosis not present

## 2021-05-17 DIAGNOSIS — I2510 Atherosclerotic heart disease of native coronary artery without angina pectoris: Secondary | ICD-10-CM | POA: Diagnosis not present

## 2021-05-17 DIAGNOSIS — G894 Chronic pain syndrome: Secondary | ICD-10-CM | POA: Diagnosis not present

## 2021-05-17 DIAGNOSIS — Z791 Long term (current) use of non-steroidal anti-inflammatories (NSAID): Secondary | ICD-10-CM | POA: Diagnosis not present

## 2021-05-17 DIAGNOSIS — I1310 Hypertensive heart and chronic kidney disease without heart failure, with stage 1 through stage 4 chronic kidney disease, or unspecified chronic kidney disease: Secondary | ICD-10-CM | POA: Diagnosis not present

## 2021-05-17 DIAGNOSIS — N183 Chronic kidney disease, stage 3 unspecified: Secondary | ICD-10-CM | POA: Diagnosis not present

## 2021-05-17 DIAGNOSIS — J9621 Acute and chronic respiratory failure with hypoxia: Secondary | ICD-10-CM | POA: Diagnosis not present

## 2021-05-17 DIAGNOSIS — M545 Low back pain, unspecified: Secondary | ICD-10-CM | POA: Diagnosis not present

## 2021-05-17 DIAGNOSIS — J449 Chronic obstructive pulmonary disease, unspecified: Secondary | ICD-10-CM | POA: Diagnosis not present

## 2021-05-17 DIAGNOSIS — E785 Hyperlipidemia, unspecified: Secondary | ICD-10-CM | POA: Diagnosis not present

## 2021-05-17 DIAGNOSIS — Z7982 Long term (current) use of aspirin: Secondary | ICD-10-CM | POA: Diagnosis not present

## 2021-05-17 DIAGNOSIS — F32A Depression, unspecified: Secondary | ICD-10-CM | POA: Diagnosis not present

## 2021-05-17 DIAGNOSIS — K219 Gastro-esophageal reflux disease without esophagitis: Secondary | ICD-10-CM | POA: Diagnosis not present

## 2021-05-17 DIAGNOSIS — I252 Old myocardial infarction: Secondary | ICD-10-CM | POA: Diagnosis not present

## 2021-05-17 DIAGNOSIS — E559 Vitamin D deficiency, unspecified: Secondary | ICD-10-CM | POA: Diagnosis not present

## 2021-05-17 DIAGNOSIS — D631 Anemia in chronic kidney disease: Secondary | ICD-10-CM | POA: Diagnosis not present

## 2021-05-17 DIAGNOSIS — I700 Atherosclerosis of aorta: Secondary | ICD-10-CM | POA: Diagnosis not present

## 2021-05-17 DIAGNOSIS — Z9181 History of falling: Secondary | ICD-10-CM | POA: Diagnosis not present

## 2021-05-17 DIAGNOSIS — Z85118 Personal history of other malignant neoplasm of bronchus and lung: Secondary | ICD-10-CM | POA: Diagnosis not present

## 2021-05-17 DIAGNOSIS — M810 Age-related osteoporosis without current pathological fracture: Secondary | ICD-10-CM | POA: Diagnosis not present

## 2021-05-17 DIAGNOSIS — Z7951 Long term (current) use of inhaled steroids: Secondary | ICD-10-CM | POA: Diagnosis not present

## 2021-05-17 DIAGNOSIS — Z96653 Presence of artificial knee joint, bilateral: Secondary | ICD-10-CM | POA: Diagnosis not present

## 2021-05-18 DIAGNOSIS — F32A Depression, unspecified: Secondary | ICD-10-CM | POA: Diagnosis not present

## 2021-05-18 DIAGNOSIS — D631 Anemia in chronic kidney disease: Secondary | ICD-10-CM | POA: Diagnosis not present

## 2021-05-18 DIAGNOSIS — J9621 Acute and chronic respiratory failure with hypoxia: Secondary | ICD-10-CM | POA: Diagnosis not present

## 2021-05-18 DIAGNOSIS — Z9181 History of falling: Secondary | ICD-10-CM | POA: Diagnosis not present

## 2021-05-18 DIAGNOSIS — G894 Chronic pain syndrome: Secondary | ICD-10-CM | POA: Diagnosis not present

## 2021-05-18 DIAGNOSIS — Z791 Long term (current) use of non-steroidal anti-inflammatories (NSAID): Secondary | ICD-10-CM | POA: Diagnosis not present

## 2021-05-18 DIAGNOSIS — K219 Gastro-esophageal reflux disease without esophagitis: Secondary | ICD-10-CM | POA: Diagnosis not present

## 2021-05-18 DIAGNOSIS — N3000 Acute cystitis without hematuria: Secondary | ICD-10-CM | POA: Diagnosis not present

## 2021-05-18 DIAGNOSIS — E559 Vitamin D deficiency, unspecified: Secondary | ICD-10-CM | POA: Diagnosis not present

## 2021-05-18 DIAGNOSIS — Z85118 Personal history of other malignant neoplasm of bronchus and lung: Secondary | ICD-10-CM | POA: Diagnosis not present

## 2021-05-18 DIAGNOSIS — Z7951 Long term (current) use of inhaled steroids: Secondary | ICD-10-CM | POA: Diagnosis not present

## 2021-05-18 DIAGNOSIS — I252 Old myocardial infarction: Secondary | ICD-10-CM | POA: Diagnosis not present

## 2021-05-18 DIAGNOSIS — M810 Age-related osteoporosis without current pathological fracture: Secondary | ICD-10-CM | POA: Diagnosis not present

## 2021-05-18 DIAGNOSIS — J449 Chronic obstructive pulmonary disease, unspecified: Secondary | ICD-10-CM | POA: Diagnosis not present

## 2021-05-18 DIAGNOSIS — M545 Low back pain, unspecified: Secondary | ICD-10-CM | POA: Diagnosis not present

## 2021-05-18 DIAGNOSIS — E785 Hyperlipidemia, unspecified: Secondary | ICD-10-CM | POA: Diagnosis not present

## 2021-05-18 DIAGNOSIS — N183 Chronic kidney disease, stage 3 unspecified: Secondary | ICD-10-CM | POA: Diagnosis not present

## 2021-05-18 DIAGNOSIS — I700 Atherosclerosis of aorta: Secondary | ICD-10-CM | POA: Diagnosis not present

## 2021-05-18 DIAGNOSIS — Z96653 Presence of artificial knee joint, bilateral: Secondary | ICD-10-CM | POA: Diagnosis not present

## 2021-05-18 DIAGNOSIS — I2510 Atherosclerotic heart disease of native coronary artery without angina pectoris: Secondary | ICD-10-CM | POA: Diagnosis not present

## 2021-05-18 DIAGNOSIS — Z7982 Long term (current) use of aspirin: Secondary | ICD-10-CM | POA: Diagnosis not present

## 2021-05-18 DIAGNOSIS — I1310 Hypertensive heart and chronic kidney disease without heart failure, with stage 1 through stage 4 chronic kidney disease, or unspecified chronic kidney disease: Secondary | ICD-10-CM | POA: Diagnosis not present

## 2021-05-24 DIAGNOSIS — N3000 Acute cystitis without hematuria: Secondary | ICD-10-CM | POA: Diagnosis not present

## 2021-05-24 DIAGNOSIS — I10 Essential (primary) hypertension: Secondary | ICD-10-CM | POA: Diagnosis not present

## 2021-05-24 DIAGNOSIS — N3001 Acute cystitis with hematuria: Secondary | ICD-10-CM | POA: Diagnosis not present

## 2021-05-24 DIAGNOSIS — L03115 Cellulitis of right lower limb: Secondary | ICD-10-CM | POA: Diagnosis not present

## 2021-05-24 DIAGNOSIS — Z23 Encounter for immunization: Secondary | ICD-10-CM | POA: Diagnosis not present

## 2021-05-25 DIAGNOSIS — Z9181 History of falling: Secondary | ICD-10-CM | POA: Diagnosis not present

## 2021-05-25 DIAGNOSIS — I2510 Atherosclerotic heart disease of native coronary artery without angina pectoris: Secondary | ICD-10-CM | POA: Diagnosis not present

## 2021-05-25 DIAGNOSIS — Z85118 Personal history of other malignant neoplasm of bronchus and lung: Secondary | ICD-10-CM | POA: Diagnosis not present

## 2021-05-25 DIAGNOSIS — M810 Age-related osteoporosis without current pathological fracture: Secondary | ICD-10-CM | POA: Diagnosis not present

## 2021-05-25 DIAGNOSIS — M545 Low back pain, unspecified: Secondary | ICD-10-CM | POA: Diagnosis not present

## 2021-05-25 DIAGNOSIS — J9621 Acute and chronic respiratory failure with hypoxia: Secondary | ICD-10-CM | POA: Diagnosis not present

## 2021-05-25 DIAGNOSIS — J449 Chronic obstructive pulmonary disease, unspecified: Secondary | ICD-10-CM | POA: Diagnosis not present

## 2021-05-25 DIAGNOSIS — Z791 Long term (current) use of non-steroidal anti-inflammatories (NSAID): Secondary | ICD-10-CM | POA: Diagnosis not present

## 2021-05-25 DIAGNOSIS — G894 Chronic pain syndrome: Secondary | ICD-10-CM | POA: Diagnosis not present

## 2021-05-25 DIAGNOSIS — K219 Gastro-esophageal reflux disease without esophagitis: Secondary | ICD-10-CM | POA: Diagnosis not present

## 2021-05-25 DIAGNOSIS — Z96653 Presence of artificial knee joint, bilateral: Secondary | ICD-10-CM | POA: Diagnosis not present

## 2021-05-25 DIAGNOSIS — Z7951 Long term (current) use of inhaled steroids: Secondary | ICD-10-CM | POA: Diagnosis not present

## 2021-05-25 DIAGNOSIS — I1310 Hypertensive heart and chronic kidney disease without heart failure, with stage 1 through stage 4 chronic kidney disease, or unspecified chronic kidney disease: Secondary | ICD-10-CM | POA: Diagnosis not present

## 2021-05-25 DIAGNOSIS — N183 Chronic kidney disease, stage 3 unspecified: Secondary | ICD-10-CM | POA: Diagnosis not present

## 2021-05-25 DIAGNOSIS — I252 Old myocardial infarction: Secondary | ICD-10-CM | POA: Diagnosis not present

## 2021-05-25 DIAGNOSIS — F32A Depression, unspecified: Secondary | ICD-10-CM | POA: Diagnosis not present

## 2021-05-25 DIAGNOSIS — I700 Atherosclerosis of aorta: Secondary | ICD-10-CM | POA: Diagnosis not present

## 2021-05-25 DIAGNOSIS — Z7982 Long term (current) use of aspirin: Secondary | ICD-10-CM | POA: Diagnosis not present

## 2021-05-25 DIAGNOSIS — E785 Hyperlipidemia, unspecified: Secondary | ICD-10-CM | POA: Diagnosis not present

## 2021-05-25 DIAGNOSIS — D631 Anemia in chronic kidney disease: Secondary | ICD-10-CM | POA: Diagnosis not present

## 2021-05-25 DIAGNOSIS — E559 Vitamin D deficiency, unspecified: Secondary | ICD-10-CM | POA: Diagnosis not present

## 2021-05-25 DIAGNOSIS — N3000 Acute cystitis without hematuria: Secondary | ICD-10-CM | POA: Diagnosis not present

## 2021-05-27 DIAGNOSIS — I1310 Hypertensive heart and chronic kidney disease without heart failure, with stage 1 through stage 4 chronic kidney disease, or unspecified chronic kidney disease: Secondary | ICD-10-CM | POA: Diagnosis not present

## 2021-05-27 DIAGNOSIS — D631 Anemia in chronic kidney disease: Secondary | ICD-10-CM | POA: Diagnosis not present

## 2021-05-27 DIAGNOSIS — Z85118 Personal history of other malignant neoplasm of bronchus and lung: Secondary | ICD-10-CM | POA: Diagnosis not present

## 2021-05-27 DIAGNOSIS — M545 Low back pain, unspecified: Secondary | ICD-10-CM | POA: Diagnosis not present

## 2021-05-27 DIAGNOSIS — N183 Chronic kidney disease, stage 3 unspecified: Secondary | ICD-10-CM | POA: Diagnosis not present

## 2021-05-27 DIAGNOSIS — F32A Depression, unspecified: Secondary | ICD-10-CM | POA: Diagnosis not present

## 2021-05-27 DIAGNOSIS — E559 Vitamin D deficiency, unspecified: Secondary | ICD-10-CM | POA: Diagnosis not present

## 2021-05-27 DIAGNOSIS — I252 Old myocardial infarction: Secondary | ICD-10-CM | POA: Diagnosis not present

## 2021-05-27 DIAGNOSIS — Z7951 Long term (current) use of inhaled steroids: Secondary | ICD-10-CM | POA: Diagnosis not present

## 2021-05-27 DIAGNOSIS — J9621 Acute and chronic respiratory failure with hypoxia: Secondary | ICD-10-CM | POA: Diagnosis not present

## 2021-05-27 DIAGNOSIS — J449 Chronic obstructive pulmonary disease, unspecified: Secondary | ICD-10-CM | POA: Diagnosis not present

## 2021-05-27 DIAGNOSIS — E785 Hyperlipidemia, unspecified: Secondary | ICD-10-CM | POA: Diagnosis not present

## 2021-05-27 DIAGNOSIS — Z9181 History of falling: Secondary | ICD-10-CM | POA: Diagnosis not present

## 2021-05-27 DIAGNOSIS — I700 Atherosclerosis of aorta: Secondary | ICD-10-CM | POA: Diagnosis not present

## 2021-05-27 DIAGNOSIS — Z96653 Presence of artificial knee joint, bilateral: Secondary | ICD-10-CM | POA: Diagnosis not present

## 2021-05-27 DIAGNOSIS — I2510 Atherosclerotic heart disease of native coronary artery without angina pectoris: Secondary | ICD-10-CM | POA: Diagnosis not present

## 2021-05-27 DIAGNOSIS — Z7982 Long term (current) use of aspirin: Secondary | ICD-10-CM | POA: Diagnosis not present

## 2021-05-27 DIAGNOSIS — N3000 Acute cystitis without hematuria: Secondary | ICD-10-CM | POA: Diagnosis not present

## 2021-05-27 DIAGNOSIS — M810 Age-related osteoporosis without current pathological fracture: Secondary | ICD-10-CM | POA: Diagnosis not present

## 2021-05-27 DIAGNOSIS — K219 Gastro-esophageal reflux disease without esophagitis: Secondary | ICD-10-CM | POA: Diagnosis not present

## 2021-05-27 DIAGNOSIS — Z791 Long term (current) use of non-steroidal anti-inflammatories (NSAID): Secondary | ICD-10-CM | POA: Diagnosis not present

## 2021-05-27 DIAGNOSIS — G894 Chronic pain syndrome: Secondary | ICD-10-CM | POA: Diagnosis not present

## 2021-05-28 DIAGNOSIS — L0390 Cellulitis, unspecified: Secondary | ICD-10-CM | POA: Diagnosis not present

## 2021-06-01 DIAGNOSIS — M545 Low back pain, unspecified: Secondary | ICD-10-CM | POA: Diagnosis not present

## 2021-06-01 DIAGNOSIS — J9621 Acute and chronic respiratory failure with hypoxia: Secondary | ICD-10-CM | POA: Diagnosis not present

## 2021-06-01 DIAGNOSIS — M810 Age-related osteoporosis without current pathological fracture: Secondary | ICD-10-CM | POA: Diagnosis not present

## 2021-06-01 DIAGNOSIS — N3000 Acute cystitis without hematuria: Secondary | ICD-10-CM | POA: Diagnosis not present

## 2021-06-01 DIAGNOSIS — Z7951 Long term (current) use of inhaled steroids: Secondary | ICD-10-CM | POA: Diagnosis not present

## 2021-06-01 DIAGNOSIS — I700 Atherosclerosis of aorta: Secondary | ICD-10-CM | POA: Diagnosis not present

## 2021-06-01 DIAGNOSIS — N183 Chronic kidney disease, stage 3 unspecified: Secondary | ICD-10-CM | POA: Diagnosis not present

## 2021-06-01 DIAGNOSIS — I252 Old myocardial infarction: Secondary | ICD-10-CM | POA: Diagnosis not present

## 2021-06-01 DIAGNOSIS — F32A Depression, unspecified: Secondary | ICD-10-CM | POA: Diagnosis not present

## 2021-06-01 DIAGNOSIS — Z85118 Personal history of other malignant neoplasm of bronchus and lung: Secondary | ICD-10-CM | POA: Diagnosis not present

## 2021-06-01 DIAGNOSIS — D631 Anemia in chronic kidney disease: Secondary | ICD-10-CM | POA: Diagnosis not present

## 2021-06-01 DIAGNOSIS — I1310 Hypertensive heart and chronic kidney disease without heart failure, with stage 1 through stage 4 chronic kidney disease, or unspecified chronic kidney disease: Secondary | ICD-10-CM | POA: Diagnosis not present

## 2021-06-01 DIAGNOSIS — Z96653 Presence of artificial knee joint, bilateral: Secondary | ICD-10-CM | POA: Diagnosis not present

## 2021-06-01 DIAGNOSIS — G894 Chronic pain syndrome: Secondary | ICD-10-CM | POA: Diagnosis not present

## 2021-06-01 DIAGNOSIS — I2510 Atherosclerotic heart disease of native coronary artery without angina pectoris: Secondary | ICD-10-CM | POA: Diagnosis not present

## 2021-06-01 DIAGNOSIS — L0390 Cellulitis, unspecified: Secondary | ICD-10-CM | POA: Diagnosis not present

## 2021-06-01 DIAGNOSIS — Z9181 History of falling: Secondary | ICD-10-CM | POA: Diagnosis not present

## 2021-06-01 DIAGNOSIS — E785 Hyperlipidemia, unspecified: Secondary | ICD-10-CM | POA: Diagnosis not present

## 2021-06-01 DIAGNOSIS — E559 Vitamin D deficiency, unspecified: Secondary | ICD-10-CM | POA: Diagnosis not present

## 2021-06-01 DIAGNOSIS — Z791 Long term (current) use of non-steroidal anti-inflammatories (NSAID): Secondary | ICD-10-CM | POA: Diagnosis not present

## 2021-06-01 DIAGNOSIS — J449 Chronic obstructive pulmonary disease, unspecified: Secondary | ICD-10-CM | POA: Diagnosis not present

## 2021-06-01 DIAGNOSIS — Z7982 Long term (current) use of aspirin: Secondary | ICD-10-CM | POA: Diagnosis not present

## 2021-06-01 DIAGNOSIS — K219 Gastro-esophageal reflux disease without esophagitis: Secondary | ICD-10-CM | POA: Diagnosis not present

## 2021-06-03 DIAGNOSIS — Z85118 Personal history of other malignant neoplasm of bronchus and lung: Secondary | ICD-10-CM | POA: Diagnosis not present

## 2021-06-03 DIAGNOSIS — M545 Low back pain, unspecified: Secondary | ICD-10-CM | POA: Diagnosis not present

## 2021-06-03 DIAGNOSIS — Z9181 History of falling: Secondary | ICD-10-CM | POA: Diagnosis not present

## 2021-06-03 DIAGNOSIS — K219 Gastro-esophageal reflux disease without esophagitis: Secondary | ICD-10-CM | POA: Diagnosis not present

## 2021-06-03 DIAGNOSIS — J449 Chronic obstructive pulmonary disease, unspecified: Secondary | ICD-10-CM | POA: Diagnosis not present

## 2021-06-03 DIAGNOSIS — E785 Hyperlipidemia, unspecified: Secondary | ICD-10-CM | POA: Diagnosis not present

## 2021-06-03 DIAGNOSIS — N183 Chronic kidney disease, stage 3 unspecified: Secondary | ICD-10-CM | POA: Diagnosis not present

## 2021-06-03 DIAGNOSIS — Z791 Long term (current) use of non-steroidal anti-inflammatories (NSAID): Secondary | ICD-10-CM | POA: Diagnosis not present

## 2021-06-03 DIAGNOSIS — G894 Chronic pain syndrome: Secondary | ICD-10-CM | POA: Diagnosis not present

## 2021-06-03 DIAGNOSIS — Z96653 Presence of artificial knee joint, bilateral: Secondary | ICD-10-CM | POA: Diagnosis not present

## 2021-06-03 DIAGNOSIS — Z7951 Long term (current) use of inhaled steroids: Secondary | ICD-10-CM | POA: Diagnosis not present

## 2021-06-03 DIAGNOSIS — Z7982 Long term (current) use of aspirin: Secondary | ICD-10-CM | POA: Diagnosis not present

## 2021-06-03 DIAGNOSIS — I252 Old myocardial infarction: Secondary | ICD-10-CM | POA: Diagnosis not present

## 2021-06-03 DIAGNOSIS — I1310 Hypertensive heart and chronic kidney disease without heart failure, with stage 1 through stage 4 chronic kidney disease, or unspecified chronic kidney disease: Secondary | ICD-10-CM | POA: Diagnosis not present

## 2021-06-03 DIAGNOSIS — I700 Atherosclerosis of aorta: Secondary | ICD-10-CM | POA: Diagnosis not present

## 2021-06-03 DIAGNOSIS — M810 Age-related osteoporosis without current pathological fracture: Secondary | ICD-10-CM | POA: Diagnosis not present

## 2021-06-03 DIAGNOSIS — D631 Anemia in chronic kidney disease: Secondary | ICD-10-CM | POA: Diagnosis not present

## 2021-06-03 DIAGNOSIS — E559 Vitamin D deficiency, unspecified: Secondary | ICD-10-CM | POA: Diagnosis not present

## 2021-06-03 DIAGNOSIS — N3000 Acute cystitis without hematuria: Secondary | ICD-10-CM | POA: Diagnosis not present

## 2021-06-03 DIAGNOSIS — F32A Depression, unspecified: Secondary | ICD-10-CM | POA: Diagnosis not present

## 2021-06-03 DIAGNOSIS — J9621 Acute and chronic respiratory failure with hypoxia: Secondary | ICD-10-CM | POA: Diagnosis not present

## 2021-06-03 DIAGNOSIS — I2510 Atherosclerotic heart disease of native coronary artery without angina pectoris: Secondary | ICD-10-CM | POA: Diagnosis not present

## 2021-06-07 DIAGNOSIS — J449 Chronic obstructive pulmonary disease, unspecified: Secondary | ICD-10-CM | POA: Diagnosis not present

## 2021-06-07 DIAGNOSIS — I10 Essential (primary) hypertension: Secondary | ICD-10-CM | POA: Diagnosis not present

## 2021-06-07 DIAGNOSIS — M810 Age-related osteoporosis without current pathological fracture: Secondary | ICD-10-CM | POA: Diagnosis not present

## 2021-06-07 DIAGNOSIS — K219 Gastro-esophageal reflux disease without esophagitis: Secondary | ICD-10-CM | POA: Diagnosis not present

## 2021-06-07 DIAGNOSIS — G8929 Other chronic pain: Secondary | ICD-10-CM | POA: Diagnosis not present

## 2021-06-07 DIAGNOSIS — N183 Chronic kidney disease, stage 3 unspecified: Secondary | ICD-10-CM | POA: Diagnosis not present

## 2021-06-07 DIAGNOSIS — J441 Chronic obstructive pulmonary disease with (acute) exacerbation: Secondary | ICD-10-CM | POA: Diagnosis not present

## 2021-06-07 DIAGNOSIS — R54 Age-related physical debility: Secondary | ICD-10-CM | POA: Diagnosis not present

## 2021-06-07 DIAGNOSIS — M1990 Unspecified osteoarthritis, unspecified site: Secondary | ICD-10-CM | POA: Diagnosis not present

## 2021-06-07 DIAGNOSIS — E782 Mixed hyperlipidemia: Secondary | ICD-10-CM | POA: Diagnosis not present

## 2021-06-08 DIAGNOSIS — M545 Low back pain, unspecified: Secondary | ICD-10-CM | POA: Diagnosis not present

## 2021-06-08 DIAGNOSIS — Z9181 History of falling: Secondary | ICD-10-CM | POA: Diagnosis not present

## 2021-06-08 DIAGNOSIS — K219 Gastro-esophageal reflux disease without esophagitis: Secondary | ICD-10-CM | POA: Diagnosis not present

## 2021-06-08 DIAGNOSIS — E559 Vitamin D deficiency, unspecified: Secondary | ICD-10-CM | POA: Diagnosis not present

## 2021-06-08 DIAGNOSIS — N183 Chronic kidney disease, stage 3 unspecified: Secondary | ICD-10-CM | POA: Diagnosis not present

## 2021-06-08 DIAGNOSIS — I1310 Hypertensive heart and chronic kidney disease without heart failure, with stage 1 through stage 4 chronic kidney disease, or unspecified chronic kidney disease: Secondary | ICD-10-CM | POA: Diagnosis not present

## 2021-06-08 DIAGNOSIS — I2510 Atherosclerotic heart disease of native coronary artery without angina pectoris: Secondary | ICD-10-CM | POA: Diagnosis not present

## 2021-06-08 DIAGNOSIS — D631 Anemia in chronic kidney disease: Secondary | ICD-10-CM | POA: Diagnosis not present

## 2021-06-08 DIAGNOSIS — Z7982 Long term (current) use of aspirin: Secondary | ICD-10-CM | POA: Diagnosis not present

## 2021-06-08 DIAGNOSIS — Z791 Long term (current) use of non-steroidal anti-inflammatories (NSAID): Secondary | ICD-10-CM | POA: Diagnosis not present

## 2021-06-08 DIAGNOSIS — E785 Hyperlipidemia, unspecified: Secondary | ICD-10-CM | POA: Diagnosis not present

## 2021-06-08 DIAGNOSIS — Z85118 Personal history of other malignant neoplasm of bronchus and lung: Secondary | ICD-10-CM | POA: Diagnosis not present

## 2021-06-08 DIAGNOSIS — I700 Atherosclerosis of aorta: Secondary | ICD-10-CM | POA: Diagnosis not present

## 2021-06-08 DIAGNOSIS — F32A Depression, unspecified: Secondary | ICD-10-CM | POA: Diagnosis not present

## 2021-06-08 DIAGNOSIS — M810 Age-related osteoporosis without current pathological fracture: Secondary | ICD-10-CM | POA: Diagnosis not present

## 2021-06-08 DIAGNOSIS — N3000 Acute cystitis without hematuria: Secondary | ICD-10-CM | POA: Diagnosis not present

## 2021-06-08 DIAGNOSIS — J9621 Acute and chronic respiratory failure with hypoxia: Secondary | ICD-10-CM | POA: Diagnosis not present

## 2021-06-08 DIAGNOSIS — Z7951 Long term (current) use of inhaled steroids: Secondary | ICD-10-CM | POA: Diagnosis not present

## 2021-06-08 DIAGNOSIS — Z96653 Presence of artificial knee joint, bilateral: Secondary | ICD-10-CM | POA: Diagnosis not present

## 2021-06-08 DIAGNOSIS — G894 Chronic pain syndrome: Secondary | ICD-10-CM | POA: Diagnosis not present

## 2021-06-08 DIAGNOSIS — J449 Chronic obstructive pulmonary disease, unspecified: Secondary | ICD-10-CM | POA: Diagnosis not present

## 2021-06-08 DIAGNOSIS — I252 Old myocardial infarction: Secondary | ICD-10-CM | POA: Diagnosis not present

## 2021-06-15 DIAGNOSIS — Z96651 Presence of right artificial knee joint: Secondary | ICD-10-CM | POA: Diagnosis not present

## 2021-06-15 DIAGNOSIS — J449 Chronic obstructive pulmonary disease, unspecified: Secondary | ICD-10-CM | POA: Diagnosis not present

## 2021-06-15 DIAGNOSIS — L0390 Cellulitis, unspecified: Secondary | ICD-10-CM | POA: Diagnosis not present

## 2021-06-15 DIAGNOSIS — I5020 Unspecified systolic (congestive) heart failure: Secondary | ICD-10-CM | POA: Diagnosis not present

## 2021-06-15 DIAGNOSIS — M5416 Radiculopathy, lumbar region: Secondary | ICD-10-CM | POA: Diagnosis not present

## 2021-06-15 DIAGNOSIS — E46 Unspecified protein-calorie malnutrition: Secondary | ICD-10-CM | POA: Diagnosis not present

## 2021-06-15 DIAGNOSIS — R269 Unspecified abnormalities of gait and mobility: Secondary | ICD-10-CM | POA: Diagnosis not present

## 2021-06-30 DIAGNOSIS — Z9181 History of falling: Secondary | ICD-10-CM | POA: Diagnosis not present

## 2021-06-30 DIAGNOSIS — Z993 Dependence on wheelchair: Secondary | ICD-10-CM | POA: Diagnosis not present

## 2021-06-30 DIAGNOSIS — L03116 Cellulitis of left lower limb: Secondary | ICD-10-CM | POA: Diagnosis not present

## 2021-06-30 DIAGNOSIS — R54 Age-related physical debility: Secondary | ICD-10-CM | POA: Diagnosis not present

## 2021-06-30 DIAGNOSIS — I10 Essential (primary) hypertension: Secondary | ICD-10-CM | POA: Diagnosis not present

## 2021-06-30 DIAGNOSIS — R300 Dysuria: Secondary | ICD-10-CM | POA: Diagnosis not present

## 2021-06-30 DIAGNOSIS — I5022 Chronic systolic (congestive) heart failure: Secondary | ICD-10-CM | POA: Diagnosis not present

## 2021-07-01 ENCOUNTER — Encounter (HOSPITAL_BASED_OUTPATIENT_CLINIC_OR_DEPARTMENT_OTHER): Payer: Medicare Other | Admitting: Internal Medicine

## 2021-07-05 ENCOUNTER — Encounter (HOSPITAL_BASED_OUTPATIENT_CLINIC_OR_DEPARTMENT_OTHER): Payer: Medicare Other | Admitting: Internal Medicine

## 2021-07-05 DIAGNOSIS — J449 Chronic obstructive pulmonary disease, unspecified: Secondary | ICD-10-CM | POA: Diagnosis not present

## 2021-07-05 DIAGNOSIS — R54 Age-related physical debility: Secondary | ICD-10-CM | POA: Diagnosis not present

## 2021-07-05 DIAGNOSIS — G8929 Other chronic pain: Secondary | ICD-10-CM | POA: Diagnosis not present

## 2021-07-05 DIAGNOSIS — M810 Age-related osteoporosis without current pathological fracture: Secondary | ICD-10-CM | POA: Diagnosis not present

## 2021-07-05 DIAGNOSIS — E782 Mixed hyperlipidemia: Secondary | ICD-10-CM | POA: Diagnosis not present

## 2021-07-05 DIAGNOSIS — C3491 Malignant neoplasm of unspecified part of right bronchus or lung: Secondary | ICD-10-CM | POA: Diagnosis not present

## 2021-07-05 DIAGNOSIS — N183 Chronic kidney disease, stage 3 unspecified: Secondary | ICD-10-CM | POA: Diagnosis not present

## 2021-07-05 DIAGNOSIS — K219 Gastro-esophageal reflux disease without esophagitis: Secondary | ICD-10-CM | POA: Diagnosis not present

## 2021-07-05 DIAGNOSIS — D508 Other iron deficiency anemias: Secondary | ICD-10-CM | POA: Diagnosis not present

## 2021-07-05 DIAGNOSIS — I10 Essential (primary) hypertension: Secondary | ICD-10-CM | POA: Diagnosis not present

## 2021-07-05 DIAGNOSIS — I5022 Chronic systolic (congestive) heart failure: Secondary | ICD-10-CM | POA: Diagnosis not present

## 2021-07-27 DIAGNOSIS — L0390 Cellulitis, unspecified: Secondary | ICD-10-CM | POA: Diagnosis not present

## 2021-07-27 DIAGNOSIS — Z87440 Personal history of urinary (tract) infections: Secondary | ICD-10-CM | POA: Diagnosis not present

## 2021-07-28 ENCOUNTER — Inpatient Hospital Stay (HOSPITAL_COMMUNITY)
Admission: EM | Admit: 2021-07-28 | Discharge: 2021-07-31 | DRG: 177 | Disposition: A | Discharge status: Home Health | Payer: Medicare Other | Attending: Internal Medicine | Admitting: Internal Medicine

## 2021-07-28 ENCOUNTER — Emergency Department (HOSPITAL_COMMUNITY): Payer: Medicare Other

## 2021-07-28 ENCOUNTER — Encounter (HOSPITAL_COMMUNITY): Payer: Self-pay | Admitting: Internal Medicine

## 2021-07-28 ENCOUNTER — Other Ambulatory Visit: Payer: Self-pay

## 2021-07-28 DIAGNOSIS — N179 Acute kidney failure, unspecified: Secondary | ICD-10-CM | POA: Diagnosis present

## 2021-07-28 DIAGNOSIS — I5181 Takotsubo syndrome: Secondary | ICD-10-CM | POA: Diagnosis present

## 2021-07-28 DIAGNOSIS — Z96653 Presence of artificial knee joint, bilateral: Secondary | ICD-10-CM | POA: Diagnosis not present

## 2021-07-28 DIAGNOSIS — Z7982 Long term (current) use of aspirin: Secondary | ICD-10-CM

## 2021-07-28 DIAGNOSIS — R0602 Shortness of breath: Secondary | ICD-10-CM | POA: Diagnosis not present

## 2021-07-28 DIAGNOSIS — E559 Vitamin D deficiency, unspecified: Secondary | ICD-10-CM | POA: Diagnosis present

## 2021-07-28 DIAGNOSIS — J440 Chronic obstructive pulmonary disease with acute lower respiratory infection: Secondary | ICD-10-CM | POA: Diagnosis present

## 2021-07-28 DIAGNOSIS — C3491 Malignant neoplasm of unspecified part of right bronchus or lung: Secondary | ICD-10-CM | POA: Diagnosis present

## 2021-07-28 DIAGNOSIS — I129 Hypertensive chronic kidney disease with stage 1 through stage 4 chronic kidney disease, or unspecified chronic kidney disease: Secondary | ICD-10-CM | POA: Diagnosis not present

## 2021-07-28 DIAGNOSIS — R0781 Pleurodynia: Secondary | ICD-10-CM | POA: Diagnosis not present

## 2021-07-28 DIAGNOSIS — Z8542 Personal history of malignant neoplasm of other parts of uterus: Secondary | ICD-10-CM | POA: Diagnosis not present

## 2021-07-28 DIAGNOSIS — F411 Generalized anxiety disorder: Secondary | ICD-10-CM | POA: Diagnosis present

## 2021-07-28 DIAGNOSIS — R062 Wheezing: Secondary | ICD-10-CM | POA: Diagnosis not present

## 2021-07-28 DIAGNOSIS — U071 COVID-19: Principal | ICD-10-CM | POA: Diagnosis present

## 2021-07-28 DIAGNOSIS — J9621 Acute and chronic respiratory failure with hypoxia: Secondary | ICD-10-CM | POA: Diagnosis not present

## 2021-07-28 DIAGNOSIS — E785 Hyperlipidemia, unspecified: Secondary | ICD-10-CM | POA: Diagnosis present

## 2021-07-28 DIAGNOSIS — Z9981 Dependence on supplemental oxygen: Secondary | ICD-10-CM

## 2021-07-28 DIAGNOSIS — E876 Hypokalemia: Secondary | ICD-10-CM | POA: Diagnosis not present

## 2021-07-28 DIAGNOSIS — F32A Depression, unspecified: Secondary | ICD-10-CM | POA: Diagnosis not present

## 2021-07-28 DIAGNOSIS — Z85118 Personal history of other malignant neoplasm of bronchus and lung: Secondary | ICD-10-CM | POA: Diagnosis not present

## 2021-07-28 DIAGNOSIS — J1282 Pneumonia due to coronavirus disease 2019: Secondary | ICD-10-CM | POA: Diagnosis present

## 2021-07-28 DIAGNOSIS — J449 Chronic obstructive pulmonary disease, unspecified: Secondary | ICD-10-CM | POA: Diagnosis present

## 2021-07-28 DIAGNOSIS — Z79899 Other long term (current) drug therapy: Secondary | ICD-10-CM | POA: Diagnosis not present

## 2021-07-28 DIAGNOSIS — Z8249 Family history of ischemic heart disease and other diseases of the circulatory system: Secondary | ICD-10-CM | POA: Diagnosis not present

## 2021-07-28 DIAGNOSIS — I10 Essential (primary) hypertension: Secondary | ICD-10-CM | POA: Diagnosis present

## 2021-07-28 DIAGNOSIS — Z87891 Personal history of nicotine dependence: Secondary | ICD-10-CM | POA: Diagnosis not present

## 2021-07-28 DIAGNOSIS — R000 Tachycardia, unspecified: Secondary | ICD-10-CM | POA: Diagnosis not present

## 2021-07-28 DIAGNOSIS — Z743 Need for continuous supervision: Secondary | ICD-10-CM | POA: Diagnosis not present

## 2021-07-28 DIAGNOSIS — N1831 Chronic kidney disease, stage 3a: Secondary | ICD-10-CM | POA: Diagnosis not present

## 2021-07-28 DIAGNOSIS — I2510 Atherosclerotic heart disease of native coronary artery without angina pectoris: Secondary | ICD-10-CM | POA: Diagnosis present

## 2021-07-28 DIAGNOSIS — M810 Age-related osteoporosis without current pathological fracture: Secondary | ICD-10-CM | POA: Diagnosis present

## 2021-07-28 DIAGNOSIS — Z7951 Long term (current) use of inhaled steroids: Secondary | ICD-10-CM | POA: Diagnosis not present

## 2021-07-28 DIAGNOSIS — J90 Pleural effusion, not elsewhere classified: Secondary | ICD-10-CM | POA: Diagnosis not present

## 2021-07-28 DIAGNOSIS — G894 Chronic pain syndrome: Secondary | ICD-10-CM | POA: Diagnosis not present

## 2021-07-28 DIAGNOSIS — K219 Gastro-esophageal reflux disease without esophagitis: Secondary | ICD-10-CM | POA: Diagnosis present

## 2021-07-28 DIAGNOSIS — R0902 Hypoxemia: Secondary | ICD-10-CM | POA: Diagnosis not present

## 2021-07-28 DIAGNOSIS — J439 Emphysema, unspecified: Secondary | ICD-10-CM | POA: Diagnosis not present

## 2021-07-28 DIAGNOSIS — N183 Chronic kidney disease, stage 3 unspecified: Secondary | ICD-10-CM | POA: Diagnosis present

## 2021-07-28 DIAGNOSIS — R079 Chest pain, unspecified: Secondary | ICD-10-CM | POA: Diagnosis not present

## 2021-07-28 LAB — CBC
HCT: 36.5 % (ref 36.0–46.0)
Hemoglobin: 11.1 g/dL — ABNORMAL LOW (ref 12.0–15.0)
MCH: 26.1 pg (ref 26.0–34.0)
MCHC: 30.4 g/dL (ref 30.0–36.0)
MCV: 85.9 fL (ref 80.0–100.0)
Platelets: 393 10*3/uL (ref 150–400)
RBC: 4.25 MIL/uL (ref 3.87–5.11)
RDW: 18.6 % — ABNORMAL HIGH (ref 11.5–15.5)
WBC: 12.4 10*3/uL — ABNORMAL HIGH (ref 4.0–10.5)
nRBC: 0 % (ref 0.0–0.2)

## 2021-07-28 LAB — PROCALCITONIN: Procalcitonin: 15.32 ng/mL

## 2021-07-28 LAB — BASIC METABOLIC PANEL
Anion gap: 9 (ref 5–15)
BUN: 13 mg/dL (ref 8–23)
CO2: 23 mmol/L (ref 22–32)
Calcium: 8.2 mg/dL — ABNORMAL LOW (ref 8.9–10.3)
Chloride: 108 mmol/L (ref 98–111)
Creatinine, Ser: 1.68 mg/dL — ABNORMAL HIGH (ref 0.44–1.00)
GFR, Estimated: 31 mL/min — ABNORMAL LOW (ref >60)
Glucose, Bld: 142 mg/dL — ABNORMAL HIGH (ref 70–99)
Potassium: 2.2 mmol/L — CL (ref 3.5–5.1)
Sodium: 140 mmol/L (ref 135–145)

## 2021-07-28 LAB — D-DIMER, QUANTITATIVE: D-Dimer, Quant: 5.18 ug/mL-FEU — ABNORMAL HIGH (ref 0.00–0.50)

## 2021-07-28 LAB — MAGNESIUM: Magnesium: 1.7 mg/dL (ref 1.7–2.4)

## 2021-07-28 LAB — LACTATE DEHYDROGENASE: LDH: 166 U/L (ref 98–192)

## 2021-07-28 LAB — RESP PANEL BY RT-PCR (FLU A&B, COVID) ARPGX2
Influenza A by PCR: NEGATIVE
Influenza B by PCR: NEGATIVE
SARS Coronavirus 2 by RT PCR: POSITIVE — AB

## 2021-07-28 LAB — FIBRINOGEN: Fibrinogen: 298 mg/dL (ref 210–475)

## 2021-07-28 LAB — TROPONIN I (HIGH SENSITIVITY)
Troponin I (High Sensitivity): 12 ng/L (ref <18)
Troponin I (High Sensitivity): 13 ng/L (ref <18)

## 2021-07-28 LAB — FERRITIN: Ferritin: 26 ng/mL (ref 11–307)

## 2021-07-28 LAB — C-REACTIVE PROTEIN: CRP: 8.2 mg/dL — ABNORMAL HIGH (ref <1.0)

## 2021-07-28 IMAGING — CR DG CHEST 2V
3 series · 3 of 3 positions shown · non-contrast
Comparison: Previous studies including the examination of
[DATE]

CLINICAL DATA: Shortness of breath

EXAM:
CHEST - 2 VIEW

[chest lat (1 of 2)]
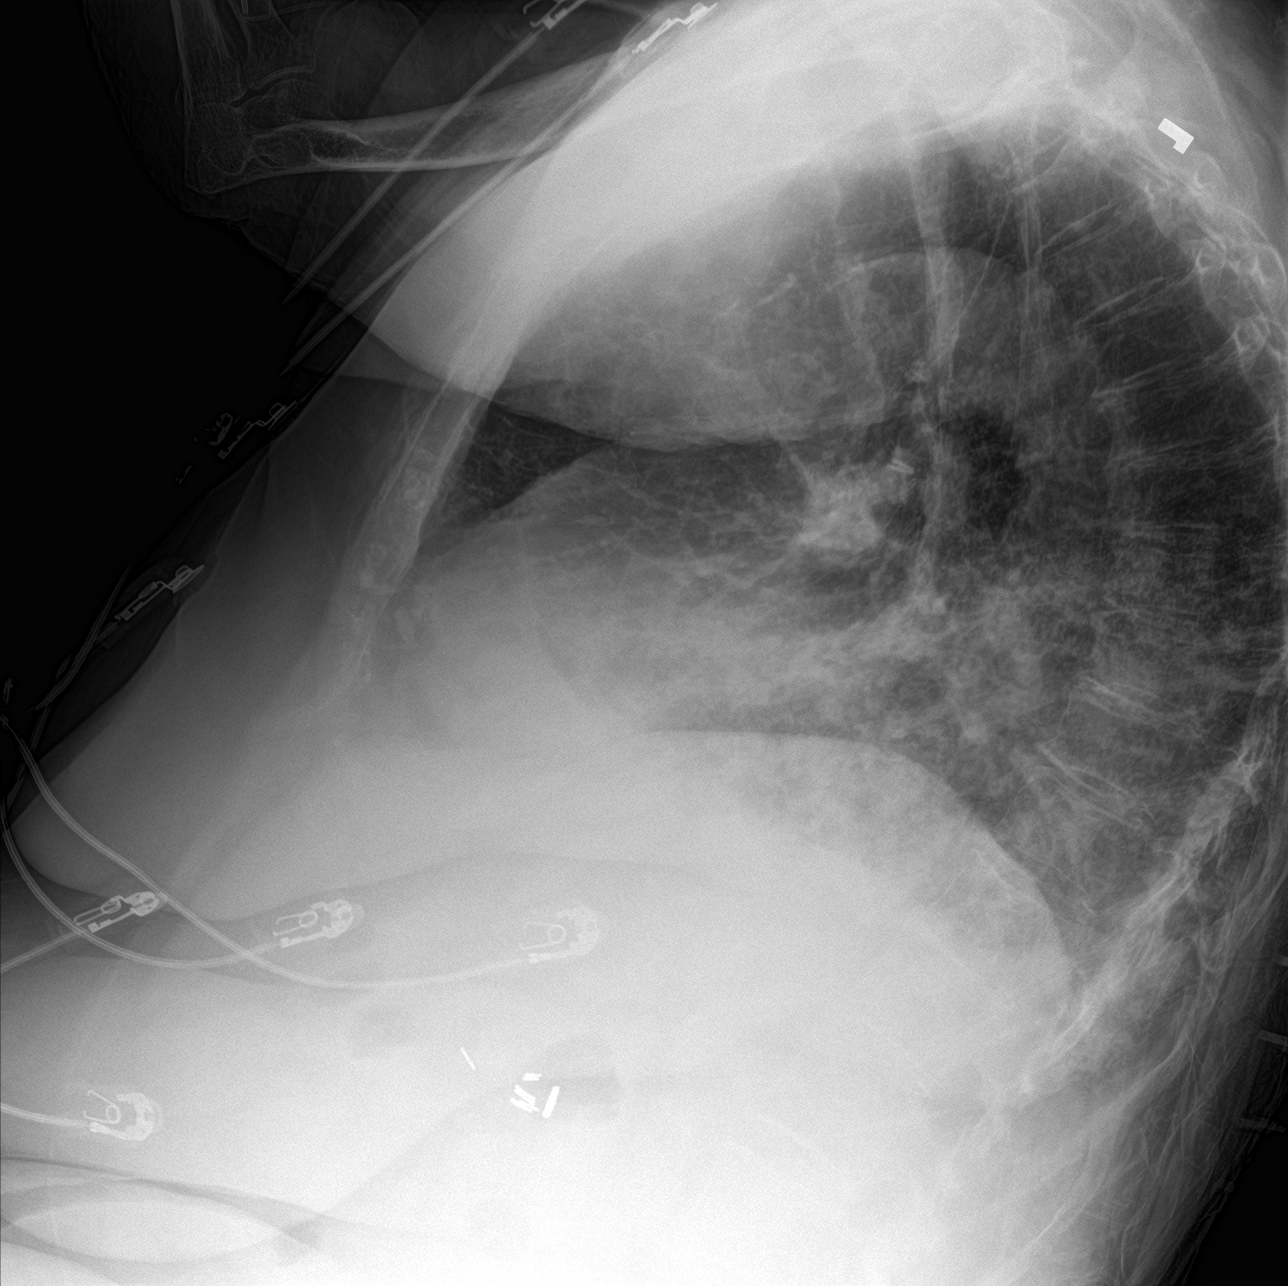

[chest ap]
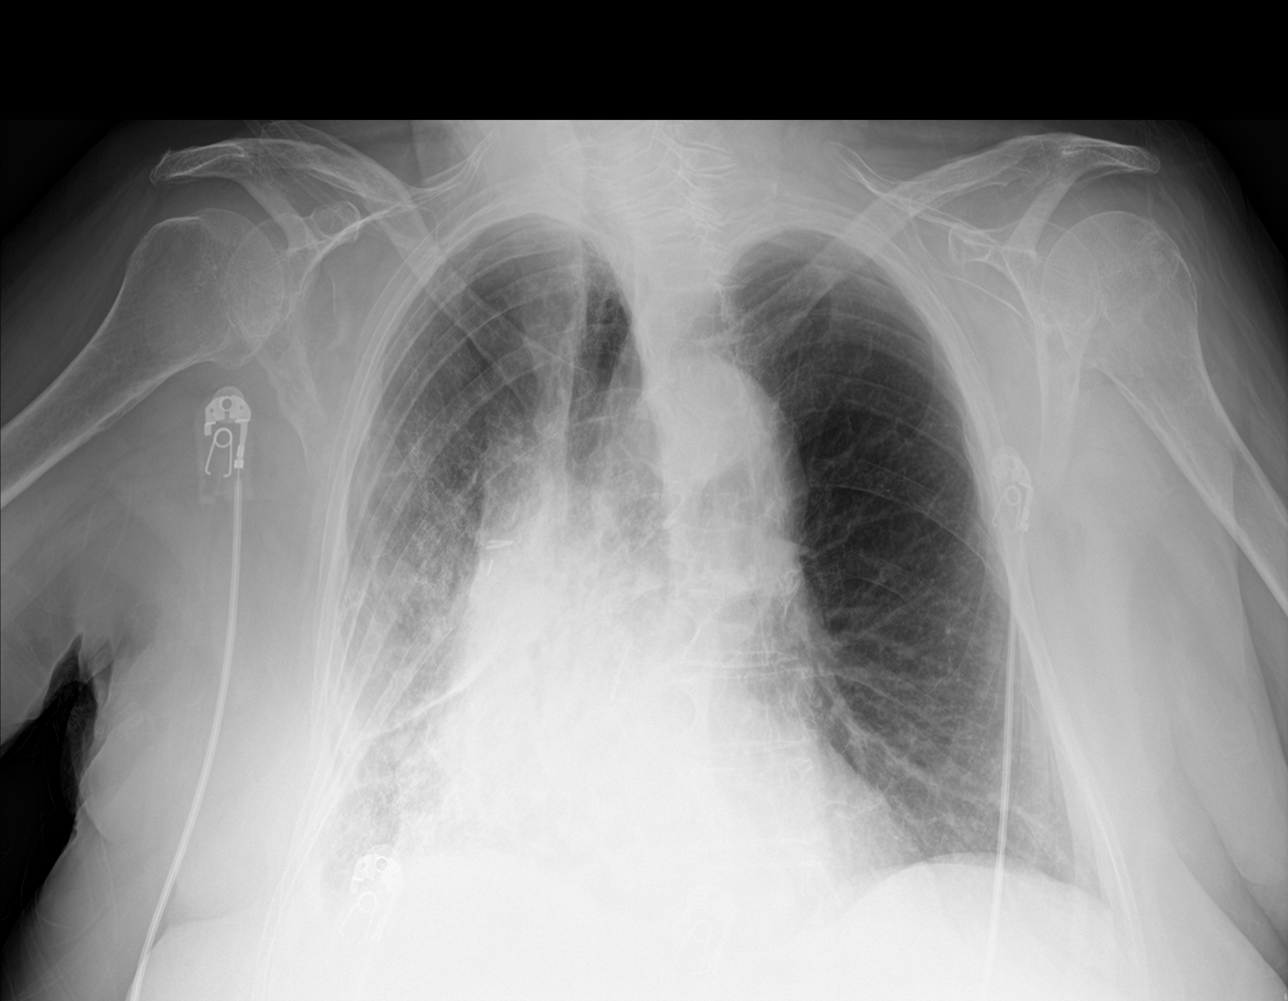

[chest lat (2 of 2)]
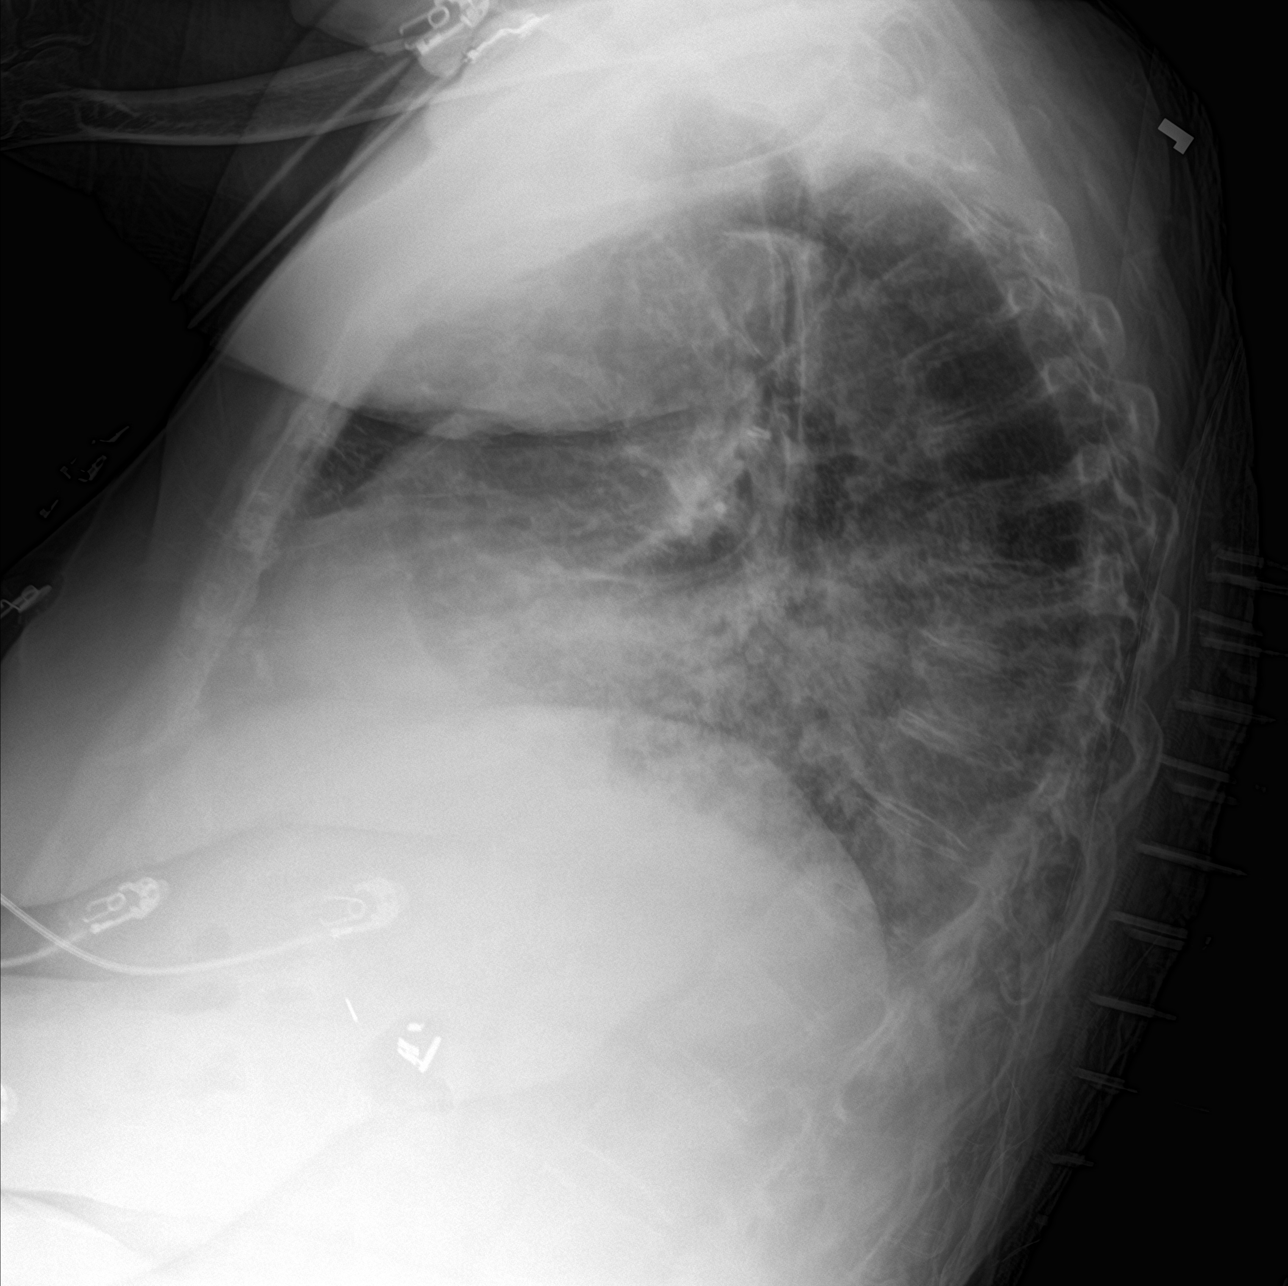

[3 of 3 positions shown; findings below may reference images not displayed]

FINDINGS: Transverse diameter of heart is increased. Thoracic aorta is
tortuous and ectatic. Apparent shift of mediastinum to the right may
be due to rotation. There is decreased volume in right lung.
Surgical staples are seen in the right parahilar region. There is
interval increase in density in the right parahilar region and right
lower lung fields. There is blunting of right lateral CP angle. Left
lung is clear. There is no pneumothorax.
IMPRESSION: There is interval appearance of patchy infiltrates in right
parahilar region and right lower lung fields suggesting pneumonia.
Small right pleural effusion. Left lung is clear.

## 2021-07-28 MED ORDER — DOCUSATE SODIUM 100 MG PO CAPS
100.0000 mg | ORAL_CAPSULE | Freq: Two times a day (BID) | ORAL | Status: DC
Start: 2021-07-28 — End: 2021-07-31
  Administered 2021-07-28 – 2021-07-30 (×3): 100 mg via ORAL
  Filled 2021-07-28 (×6): qty 1

## 2021-07-28 MED ORDER — ZINC SULFATE 220 (50 ZN) MG PO CAPS
220.0000 mg | ORAL_CAPSULE | Freq: Every day | ORAL | Status: DC
  Administered 2021-07-28 – 2021-07-31 (×4): 220 mg via ORAL
  Filled 2021-07-28 (×4): qty 1

## 2021-07-28 MED ORDER — DIAZEPAM 5 MG PO TABS: 2.5000 mg | ORAL_TABLET | Freq: Three times a day (TID) | ORAL | Status: DC | PRN

## 2021-07-28 MED ORDER — ATORVASTATIN CALCIUM 10 MG PO TABS
20.0000 mg | ORAL_TABLET | Freq: Every evening | ORAL | Status: DC
  Administered 2021-07-28 – 2021-07-30 (×3): 20 mg via ORAL
  Filled 2021-07-28 (×3): qty 2

## 2021-07-28 MED ORDER — OXYCODONE HCL 5 MG PO TABS
5.0000 mg | ORAL_TABLET | ORAL | Status: DC | PRN
  Filled 2021-07-28: qty 1

## 2021-07-28 MED ORDER — FLUTICASONE FUROATE-VILANTEROL 100-25 MCG/ACT IN AEPB
1.0000 | INHALATION_SPRAY | Freq: Every day | RESPIRATORY_TRACT | Status: DC
  Administered 2021-07-29 – 2021-07-31 (×3): 1 via RESPIRATORY_TRACT
  Filled 2021-07-28: qty 28

## 2021-07-28 MED ORDER — POTASSIUM CHLORIDE 10 MEQ/100ML IV SOLN
10.0000 meq | INTRAVENOUS | Status: AC
  Administered 2021-07-28 (×4): 10 meq via INTRAVENOUS
  Filled 2021-07-28 (×4): qty 100

## 2021-07-28 MED ORDER — OXYBUTYNIN CHLORIDE ER 5 MG PO TB24
5.0000 mg | ORAL_TABLET | Freq: Every day | ORAL | Status: DC
  Administered 2021-07-28 – 2021-07-30 (×3): 5 mg via ORAL
  Filled 2021-07-28 (×5): qty 1

## 2021-07-28 MED ORDER — BISACODYL 5 MG PO TBEC: 5.0000 mg | DELAYED_RELEASE_TABLET | Freq: Every day | ORAL | Status: DC | PRN

## 2021-07-28 MED ORDER — ALBUTEROL SULFATE HFA 108 (90 BASE) MCG/ACT IN AERS
2.0000 | INHALATION_SPRAY | RESPIRATORY_TRACT | Status: DC | PRN
  Administered 2021-07-29 – 2021-07-30 (×4): 2 via RESPIRATORY_TRACT
  Filled 2021-07-28: qty 6.7

## 2021-07-28 MED ORDER — AMLODIPINE BESYLATE 5 MG PO TABS
5.0000 mg | ORAL_TABLET | Freq: Every day | ORAL | Status: DC
Start: 2021-07-28 — End: 2021-07-31
  Administered 2021-07-28 – 2021-07-31 (×4): 5 mg via ORAL
  Filled 2021-07-28 (×4): qty 1

## 2021-07-28 MED ORDER — RALOXIFENE HCL 60 MG PO TABS
60.0000 mg | ORAL_TABLET | Freq: Every day | ORAL | Status: DC
  Administered 2021-07-28 – 2021-07-31 (×4): 60 mg via ORAL
  Filled 2021-07-28 (×4): qty 1

## 2021-07-28 MED ORDER — SODIUM CHLORIDE 0.9% FLUSH: 3.0000 mL | INTRAVENOUS | Status: DC | PRN

## 2021-07-28 MED ORDER — ASPIRIN EC 81 MG PO TBEC
81.0000 mg | DELAYED_RELEASE_TABLET | Freq: Every day | ORAL | Status: DC
  Administered 2021-07-28 – 2021-07-31 (×4): 81 mg via ORAL
  Filled 2021-07-28 (×4): qty 1

## 2021-07-28 MED ORDER — ONDANSETRON HCL 4 MG PO TABS
4.0000 mg | ORAL_TABLET | Freq: Four times a day (QID) | ORAL | Status: DC | PRN
  Administered 2021-07-29: 4 mg via ORAL
  Filled 2021-07-28: qty 1

## 2021-07-28 MED ORDER — NYSTATIN 100000 UNIT/GM EX POWD
Freq: Once | CUTANEOUS | Status: AC
  Filled 2021-07-28: qty 15

## 2021-07-28 MED ORDER — SODIUM CHLORIDE 0.9 % IV SOLN
250.0000 mL | INTRAVENOUS | Status: DC | PRN
Start: 2021-07-28 — End: 2021-07-31

## 2021-07-28 MED ORDER — SERTRALINE HCL 100 MG PO TABS
100.0000 mg | ORAL_TABLET | Freq: Two times a day (BID) | ORAL | Status: DC
  Administered 2021-07-28 – 2021-07-31 (×7): 100 mg via ORAL
  Filled 2021-07-28 (×8): qty 1

## 2021-07-28 MED ORDER — GUAIFENESIN-DM 100-10 MG/5ML PO SYRP
10.0000 mL | ORAL_SOLUTION | ORAL | Status: DC | PRN
  Administered 2021-07-29 – 2021-07-31 (×2): 10 mL via ORAL
  Filled 2021-07-28 (×2): qty 10

## 2021-07-28 MED ORDER — ONDANSETRON HCL 4 MG/2ML IJ SOLN: 4.0000 mg | Freq: Four times a day (QID) | INTRAMUSCULAR | Status: DC | PRN

## 2021-07-28 MED ORDER — TIZANIDINE HCL 4 MG PO TABS: 2.0000 mg | ORAL_TABLET | Freq: Four times a day (QID) | ORAL | Status: DC | PRN

## 2021-07-28 MED ORDER — DOXYCYCLINE HYCLATE 100 MG PO TABS
100.0000 mg | ORAL_TABLET | Freq: Once | ORAL | Status: AC
  Administered 2021-07-28: 100 mg via ORAL
  Filled 2021-07-28: qty 1

## 2021-07-28 MED ORDER — MIRTAZAPINE 15 MG PO TABS
7.5000 mg | ORAL_TABLET | Freq: Every day | ORAL | Status: DC
  Administered 2021-07-28 – 2021-07-30 (×3): 7.5 mg via ORAL
  Filled 2021-07-28 (×4): qty 1

## 2021-07-28 MED ORDER — HYDROCOD POLST-CPM POLST ER 10-8 MG/5ML PO SUER: 5.0000 mL | Freq: Two times a day (BID) | ORAL | Status: DC | PRN

## 2021-07-28 MED ORDER — TRAMADOL HCL 50 MG PO TABS: 50.0000 mg | ORAL_TABLET | Freq: Three times a day (TID) | ORAL | Status: DC | PRN

## 2021-07-28 MED ORDER — ACETAMINOPHEN 325 MG PO TABS: 650.0000 mg | ORAL_TABLET | Freq: Four times a day (QID) | ORAL | Status: DC | PRN

## 2021-07-28 MED ORDER — ENOXAPARIN SODIUM 30 MG/0.3ML IJ SOSY
30.0000 mg | PREFILLED_SYRINGE | INTRAMUSCULAR | Status: DC
  Administered 2021-07-28 – 2021-07-29 (×2): 30 mg via SUBCUTANEOUS
  Filled 2021-07-28 (×2): qty 0.3

## 2021-07-28 MED ORDER — PREDNISONE 20 MG PO TABS
50.0000 mg | ORAL_TABLET | Freq: Every day | ORAL | Status: DC
  Administered 2021-07-31: 50 mg via ORAL
  Filled 2021-07-28: qty 1

## 2021-07-28 MED ORDER — UMECLIDINIUM BROMIDE 62.5 MCG/ACT IN AEPB
1.0000 | INHALATION_SPRAY | Freq: Every day | RESPIRATORY_TRACT | Status: DC
  Administered 2021-07-29 – 2021-07-31 (×3): 1 via RESPIRATORY_TRACT
  Filled 2021-07-28: qty 7

## 2021-07-28 MED ORDER — PANTOPRAZOLE SODIUM 40 MG PO TBEC
40.0000 mg | DELAYED_RELEASE_TABLET | Freq: Every day | ORAL | Status: DC
  Administered 2021-07-28 – 2021-07-31 (×4): 40 mg via ORAL
  Filled 2021-07-28 (×4): qty 1

## 2021-07-28 MED ORDER — ASCORBIC ACID 500 MG PO TABS
500.0000 mg | ORAL_TABLET | Freq: Every day | ORAL | Status: DC
  Administered 2021-07-28 – 2021-07-31 (×4): 500 mg via ORAL
  Filled 2021-07-28 (×4): qty 1

## 2021-07-28 MED ORDER — FLUTICASONE-UMECLIDIN-VILANT 100-62.5-25 MCG/ACT IN AEPB: 1.0000 | INHALATION_SPRAY | Freq: Every day | RESPIRATORY_TRACT | Status: DC

## 2021-07-28 MED ORDER — POTASSIUM CHLORIDE CRYS ER 20 MEQ PO TBCR
40.0000 meq | EXTENDED_RELEASE_TABLET | Freq: Once | ORAL | Status: AC
  Administered 2021-07-28: 40 meq via ORAL
  Filled 2021-07-28: qty 2

## 2021-07-28 MED ORDER — GABAPENTIN 400 MG PO CAPS
400.0000 mg | ORAL_CAPSULE | Freq: Three times a day (TID) | ORAL | Status: DC
  Administered 2021-07-28 – 2021-07-31 (×8): 400 mg via ORAL
  Filled 2021-07-28 (×8): qty 1

## 2021-07-28 MED ORDER — METHYLPREDNISOLONE SODIUM SUCC 40 MG IJ SOLR
0.5000 mg/kg | Freq: Two times a day (BID) | INTRAMUSCULAR | Status: AC
  Administered 2021-07-28 – 2021-07-31 (×6): 34 mg via INTRAVENOUS
  Filled 2021-07-28 (×6): qty 1

## 2021-07-28 MED ORDER — IPRATROPIUM-ALBUTEROL 0.5-2.5 (3) MG/3ML IN SOLN
3.0000 mL | RESPIRATORY_TRACT | Status: DC
  Administered 2021-07-28 (×2): 3 mL via RESPIRATORY_TRACT
  Filled 2021-07-28: qty 3

## 2021-07-28 MED ORDER — DOXYCYCLINE HYCLATE 100 MG PO TABS
100.0000 mg | ORAL_TABLET | Freq: Two times a day (BID) | ORAL | Status: DC
  Administered 2021-07-28 – 2021-07-29 (×2): 100 mg via ORAL
  Filled 2021-07-28 (×2): qty 1

## 2021-07-28 MED ORDER — SODIUM CHLORIDE 0.9% FLUSH
3.0000 mL | Freq: Two times a day (BID) | INTRAVENOUS | Status: DC
  Administered 2021-07-28 – 2021-07-30 (×5): 3 mL via INTRAVENOUS

## 2021-07-28 MED ORDER — FLEET ENEMA 7-19 GM/118ML RE ENEM: 1.0000 | ENEMA | Freq: Once | RECTAL | Status: DC | PRN

## 2021-07-28 MED ORDER — SODIUM CHLORIDE 0.9 % IV SOLN
1.0000 g | Freq: Once | INTRAVENOUS | Status: AC
  Administered 2021-07-28: 1 g via INTRAVENOUS
  Filled 2021-07-28: qty 10

## 2021-07-28 MED ORDER — NIRMATRELVIR/RITONAVIR (PAXLOVID)TABLET
3.0000 | ORAL_TABLET | Freq: Two times a day (BID) | ORAL | Status: DC
  Administered 2021-07-28 – 2021-07-31 (×6): 3 via ORAL
  Filled 2021-07-28: qty 30

## 2021-07-28 MED ORDER — SODIUM CHLORIDE 0.9 % IV SOLN
1.0000 g | INTRAVENOUS | Status: DC
  Administered 2021-07-29: 1 g via INTRAVENOUS
  Filled 2021-07-28: qty 10

## 2021-07-28 MED ORDER — POLYETHYLENE GLYCOL 3350 17 G PO PACK: 17.0000 g | PACK | Freq: Every day | ORAL | Status: DC | PRN

## 2021-07-28 MED ORDER — SODIUM CHLORIDE 0.9% FLUSH
3.0000 mL | Freq: Two times a day (BID) | INTRAVENOUS | Status: DC
  Administered 2021-07-28 – 2021-07-31 (×6): 3 mL via INTRAVENOUS

## 2021-07-28 NOTE — Assessment & Plan Note (Addendum)
-  I have reviewed this patient in the [place] Controlled Substances Reporting System.  She is receiving medications from only one provider and appears to be taking them as prescribed. -She is at high risk of opioid misuse, diversion, or overdose. -Continue Ultram, Zanaflex, Neurontin -Hold Mobic -Will add Oxy if needed for breakthrough pain

## 2021-07-28 NOTE — Assessment & Plan Note (Signed)
-  Continue Norvasc -Hold Lisinopril

## 2021-07-28 NOTE — ED Provider Notes (Signed)
[facility] EMERGENCY DEPARTMENT Provider Note   CSN: [id number] Arrival date & time: 07/28/21  6295     History  Chief Complaint  Patient presents with   Shortness of Breath    [name] is a 81 y.o. female with a pertinent PMHx of HTN, lung cancer s/p resection, COPD, and asthma presented with c/o of SOB for the past 2-3 days. She states she has experienced SOB in the past but this time was at its worse. She endorse associated CP. Pt uses O2 supplementation at home, unable to state how many liters. She uses her inhales and nebulizer daily with moderate relief. However, for the last few days, pt states her SOB persisted. This morning, pt states she felt she was going to pass out in the bathroom .She was on the commode, when she got up she felt lightheaded and grabbed onto her walker. She then, called out for her son for help.  Her son called EMS and she was brought to the hospital.   Shortness of Breath Associated symptoms: cough and wheezing       Home Medications Prior to Admission medications   Medication Sig Start Date End Date Taking? Authorizing Provider  acetaminophen (TYLENOL) 500 MG tablet Take 2 tablets (1,000 mg total) by mouth every 6 (six) hours. Patient taking differently: Take 1,000 mg by mouth every 6 (six) hours as needed for moderate pain. 05/28/19   Donia Ast, PA  albuterol (PROVENTIL HFA;VENTOLIN HFA) 108 (90 BASE) MCG/ACT inhaler Inhale 2 puffs into the lungs every 6 (six) hours as needed. Patient taking differently: Inhale 2 puffs into the lungs every 4 (four) hours as needed for wheezing or shortness of breath. 04/28/15   Hosie Poisson, MD  aluminum-magnesium hydroxide-simethicone (MAALOX) 284-132-44 MG/5ML SUSP Take 30 mLs by mouth 3 (three) times daily as needed (indigestion).     [provider]  amLODipine (NORVASC) 5 MG tablet Take 5 mg by mouth daily.    [provider]  aspirin EC 81 MG EC tablet Take 1  tablet (81 mg total) by mouth daily. Swallow whole. 05/14/21   Antonieta Pert, MD  atorvastatin (LIPITOR) 20 MG tablet Take 20 mg by mouth every evening.     [provider]  diazepam (VALIUM) 5 MG tablet Take 0.5 tablets (2.5 mg total) by mouth every 8 (eight) hours as needed for anxiety. Must last 30 days. 06/16/20   Lavina Hamman, MD  ferrous sulfate 325 (65 FE) MG tablet Take 1 tablet (325 mg total) by mouth daily with breakfast. 06/16/20   Lavina Hamman, MD  furosemide (LASIX) 20 MG tablet Take 20 mg by mouth every Monday, Wednesday, and Friday. Monday and Wednesday 11/04/19   [provider]  gabapentin (NEURONTIN) 400 MG capsule Take 1 capsule (400 mg total) by mouth 4 (four) times daily. Patient taking differently: Take 400 mg by mouth 3 (three) times daily. 05/24/18 05/12/21  Vevelyn Francois, NP  lisinopril (ZESTRIL) 10 MG tablet Take 10 mg by mouth every morning. 04/28/21   [provider]  meclizine (ANTIVERT) 25 MG tablet Take 1 tablet (25 mg total) by mouth 3 (three) times daily as needed for dizziness. 10/29/20   Fredia Sorrow, MD  meloxicam (MOBIC) 7.5 MG tablet Take 7.5 mg by mouth daily.    [provider]  metoCLOPramide (REGLAN) 5 MG tablet Take 5 mg by mouth 2 (two) times daily.     [provider]  mirtazapine (REMERON)  7.5 MG tablet Take 7.5 mg by mouth at bedtime. 11/04/19   [provider]  Multiple Vitamin (MULTIVITAMIN WITH MINERALS) TABS tablet Take 1 tablet by mouth daily. 06/17/20   Lavina Hamman, MD  oxybutynin (DITROPAN-XL) 5 MG 24 hr tablet Take 5 mg by mouth at bedtime. 09/06/19   [provider]  pantoprazole (PROTONIX) 40 MG tablet Take 1 tablet (40 mg total) by mouth daily at 6 (six) AM. 04/28/15   Hosie Poisson, MD  raloxifene (EVISTA) 60 MG tablet Take 60 mg by mouth daily.    [provider]  sertraline (ZOLOFT) 100 MG tablet Take 100 mg by mouth in the morning and at bedtime.     [provider]  tiZANidine (ZANAFLEX) 2 MG tablet Take 1 tablet (2 mg total) by mouth every 6 (six) hours as needed. Patient taking differently: Take 2 mg by mouth every 6 (six) hours as needed for muscle spasms. 05/28/19   Donia Ast, PA  traMADol (ULTRAM) 50 MG tablet Take 1 tablet (50 mg total) by mouth 3 (three) times daily as needed for moderate pain. 06/16/20   Lavina Hamman, MD  TRELEGY ELLIPTA 100-62.5-25 MCG/INH AEPB Inhale 1 puff into the lungs daily. 05/14/19   [provider]      Allergies    Boniva [ibandronate sodium], Lyrica [pregabalin], and Levofloxacin    Review of Systems   Review of Systems  Respiratory:  Positive for cough, shortness of breath and wheezing.        Pleurisy  Gastrointestinal:  Positive for nausea.   Physical Exam Updated Vital Signs BP 114/71 (BP Location: Right Arm)    Pulse (!) 101    Temp 98.8 F (37.1 C) (Oral)    Resp 20    Ht 5\' 2"  (1.575 m)    Wt 68 kg    SpO2 96%    BMI 27.44 kg/m  Physical Exam Constitutional:      General: She is awake. She is not in acute distress.    Interventions: Nasal cannula in place.  HENT:     Head: Normocephalic and atraumatic.  Cardiovascular:     Rate and Rhythm: Normal rate.     Heart sounds: Normal heart sounds.  Pulmonary:     Breath sounds: Normal air entry. Wheezing present.     Comments: Increased work of breathing noted  Chest:     Comments: Intertrigo under right breast Musculoskeletal:     Right lower leg: No edema.     Left lower leg: No edema.     Comments: Rash noted on the left shin  Skin:    General: Skin is warm and dry.  Neurological:     General: No focal deficit present.     Mental Status: She is alert.  Psychiatric:        Behavior: Behavior is cooperative.    ED Results / Procedures / Treatments   Labs (all labs ordered are listed, but only abnormal results are displayed) Labs Reviewed  RESP PANEL BY RT-PCR (FLU A&B, COVID) [id number]  BASIC METABOLIC  PANEL  CBC    EKG EKG Interpretation  Date/Time:  Wednesday July 28 2021 08:37:56 EST Ventricular Rate:  101 PR Interval:  139 QRS Duration: 92 QT Interval:  445 QTC Calculation: 577 R Axis:   50 Text Interpretation: Sinus tachycardia Nonspecific repol abnormality, lateral leads Prolonged QT interval Confirmed by Madalyn Rob [phone]) on 07/28/2021 8:56:27 AM  Radiology No results found.  Procedures Procedures    Medications Ordered in ED Medications - No data to display  ED Course/ Medical Decision Making/ A&P                           Medical Decision Making  COVID Pneumonia Pt presented with SOB worse than baseline. At home inhalers and nebulizer provided minimal relief. Pt is afebrile; however, has a white count of 12.4. CXR reveals right lower lobe infiltrates suggestive of pneumonia. Resp panel positive for covid.  Patient states she is vaccinated for COVID and influenza.  Currently on O2 supplementation, which pt is on O2 at home, liters used at home, unknown.  Patient received a dose of ceftriaxone and doxycycline due to suspicion of pneumonia, which is now confirmed with rest panel.  Azithromycin was avoided due to QT prolongation noted on EKG. Pt to be admitted for further evaluation and treatment.  Hypokalemia Initial labs significant for potassium of 2.2.  Patient has been using albuterol with increased frequency over the last several days due to shortness of breath.  Which can contribute to lowering her potassium levels.  Patient denies any weakness, numbness or tingling of the extremities.  EKG significant for prolonged QT interval.  Patient received 40 mEq of potassium oral.  Followed by 4 runs of IV 10 mEq of potassium.  AKI Initial labs are significant for elevated creatinine at 1.68; BUN within normal limits.  Baseline appears to be 1.1-1.2.  Patient endorses decreased oral intake over the last few days.  Patient is able to eat and drink.  Oral hydration  since admission.        Final Clinical Impression(s) / ED Diagnoses Final diagnoses:  None    Rx / DC Orders ED Discharge Orders     None         Timothy Lasso, MD 07/28/21 1101    Lucrezia Starch, MD 07/28/21 1620

## 2021-07-28 NOTE — Assessment & Plan Note (Signed)
-  Also with depression -Continue Zoloft, Remeron, Valium

## 2021-07-28 NOTE — Assessment & Plan Note (Signed)
-  Patient with presenting with worsening SOB and cough  -Usual home O2 requirement (?2-3L) and is currently requiring 3L [place] O2 -COVID POSITIVE -The patient has comorbidities which may increase the risk for ARDS/MODS including: age, HTN, COPD, CKD -Pertinent labs concerning for COVID include markedly elevated CRP (>7) -CXR with multifocal opacities which may be c/w COVID vs. Multifocal PNA -Will treat with broad-spectrum antibiotics given procalcitonin >0.5.   -Will admit for further evaluation, close monitoring, and treatment -Monitor on telemetry x at least 24 hours -At this time, will attempt to avoid use of aerosolized medications and use HFAs instead -Will check daily labs including BMP with Mag, Phos; LFTs; CBC with differential; CRP; ferritin; fibrinogen; D-dimer -Will order steroids and Paxlovid given +COVID test, +CXR, and hypoxia <94%  -If the patient shows clinical deterioration, consider transfer to ICU with PCCM consultation -PT/OT consults -Encourage mobilization/ambulation as much as possible -Patient was seen wearing full PPE including: gown, gloves, N95, and face shield; donning and doffing was in compliance with current standards.

## 2021-07-28 NOTE — ED Notes (Signed)
Got patient on the monitor did ekg shown to er provider patient is resting with call bel in reach

## 2021-07-28 NOTE — ED Notes (Signed)
K 2.2, critical value relayed to Dr Jeanice Lim

## 2021-07-28 NOTE — H&P (Signed)
History and Physical    Patient: [name] UEA:[id number] DOB: 1940/10/27 DOA: 07/28/2021 DOS: the patient was seen and examined on 07/28/2021 PCP: Wenda Low, MD  Patient coming from: Home; NOK: [name], [phone]  Chief Complaint: SOB  HPI: [name] is a 81 y.o. female with medical history significant of COPD on home O2; lung cancer (2018); uterine cancer; CAD; HTN; HLD; and Takotsubo cardiomyopathy presenting with SOB.  She reports that, "Honey, I'm sick."  She is on home O2 but is unable to say how much she wears.  She got more SOB the last couple of days.      ER Course:  SOB x 2-3 days.  COVID +.  On 3L O2 for now.  CXR with RLL PNA.  She is vaccinated.  K+ 2.2, replaced with IV and PO.  Also with AKI.   Review of Systems: As mentioned in the history of present illness. All other systems reviewed and are negative. Past Medical History:  Diagnosis Date   Acute metabolic encephalopathy 08/65/7846   Adenocarcinoma of right lung, stage 1 ([facility]) 07/28/2016   Anxiety    Cancer ([place])    uterine   Closed fracture of left distal radius 06/13/2020   COPD (chronic obstructive pulmonary disease) ([facility])    Coronary artery disease    mild nonobstructive by 2019 cath   Depression    GERD (gastroesophageal reflux disease)    Hyperlipidemia    Hypertension    Low back pain    Osteoarthritis    Osteoporosis    Takotsubo cardiomyopathy    EF recovered to 50-55% by 01/23/18 echo   Vitamin D insufficiency 06/13/2020   Past Surgical History:  Procedure Laterality Date   ANKLE SURGERY Right    horse accident   APPENDECTOMY     BACK SURGERY     CHOLECYSTECTOMY     EYE SURGERY     lense implant   FRACTURE SURGERY     HIP SURGERY  01/2010   Left Hip    INNER EAR SURGERY Bilateral 1970   related to severe ear infections   KNEE SURGERY     Right knee   LEFT HEART CATH AND CORONARY ANGIOGRAPHY N/A 12/11/2017   Procedure: LEFT HEART CATH AND CORONARY ANGIOGRAPHY;   Surgeon: Jettie Booze, MD;  Location: [facility];  Service: Cardiovascular;  Laterality: N/A;   LOBECTOMY Right 05/09/2016   Procedure: RIGHT UPPER LOBECTOMY;  Surgeon: [name], MD;  Location: [facility];  Service: Thoracic;  Laterality: Right;   MASS EXCISION  08/25/2011   Procedure: EXCISION MASS;  Surgeon: Harl Bowie, MD;  Location: [facility];  Service: General;  Laterality: N/A;  excision left neck mass   OPEN REDUCTION INTERNAL FIXATION (ORIF) DISTAL RADIAL FRACTURE Left 06/12/2020   Procedure: OPEN REDUCTION INTERNAL FIXATION (ORIF) DISTAL RADIAL FRACTURE;  Surgeon: Shona Needles, MD;  Location: [facility];  Service: Orthopedics;  Laterality: Left;   TOTAL ABDOMINAL HYSTERECTOMY     TOTAL KNEE ARTHROPLASTY Right 05/27/2019   Procedure: TOTAL KNEE ARTHROPLASTY;  Surgeon: Vickey Huger, MD;  Location: [facility];  Service: Orthopedics;  Laterality: Right;  75 mins needed for length of case   TOTAL KNEE ARTHROPLASTY Left 12/30/2019   Procedure: TOTAL KNEE ARTHROPLASTY;  Surgeon: Vickey Huger, MD;  Location: [facility];  Service: Orthopedics;  Laterality: Left;   VIDEO ASSISTED THORACOSCOPY (VATS)/WEDGE RESECTION Right 05/09/2016   Procedure: VIDEO ASSISTED THORACOSCOPY (VATS)/WEDGE RESECTION;  Surgeon:  [name], MD;  Location: [facility];  Service: Thoracic;  Laterality: Right;   Social History:  reports that she quit smoking about 9 years ago. Her smoking use included cigarettes. She has a 102.00 pack-year smoking history. She has never used smokeless tobacco. She reports that she does not drink alcohol and does not use drugs.  Allergies  Allergen Reactions   Boniva [Ibandronate Sodium] Other (See Comments)    UNSPECIFIED REACTION    Lyrica [Pregabalin] Nausea And Vomiting    Dizziness and blurred vision as well   Levofloxacin     Pt states she isn't sure     Family History  Problem Relation Age of Onset   Heart disease Mother    Heart disease  Father    Cancer Sister        #1, breast   Heart disease Brother        had CABG    Prior to Admission medications   Medication Sig Start Date End Date Taking? Authorizing Provider  acetaminophen (TYLENOL) 500 MG tablet Take 2 tablets (1,000 mg total) by mouth every 6 (six) hours. Patient taking differently: Take 1,000 mg by mouth every 6 (six) hours as needed for moderate pain. 05/28/19  Yes Donia Ast, PA  albuterol (PROVENTIL HFA;VENTOLIN HFA) 108 (90 BASE) MCG/ACT inhaler Inhale 2 puffs into the lungs every 6 (six) hours as needed. Patient taking differently: Inhale 2 puffs into the lungs every 4 (four) hours as needed for wheezing or shortness of breath. 04/28/15  Yes Hosie Poisson, MD  aluminum-magnesium hydroxide-simethicone (MAALOX) 010-272-53 MG/5ML SUSP Take 30 mLs by mouth 3 (three) times daily as needed (indigestion).    Yes [provider]  amLODipine (NORVASC) 5 MG tablet Take 5 mg by mouth daily.   Yes [provider]  aspirin EC 81 MG EC tablet Take 1 tablet (81 mg total) by mouth daily. Swallow whole. 05/14/21  Yes Antonieta Pert, MD  atorvastatin (LIPITOR) 20 MG tablet Take 20 mg by mouth every evening.    Yes [provider]  diazepam (VALIUM) 5 MG tablet Take 0.5 tablets (2.5 mg total) by mouth every 8 (eight) hours as needed for anxiety. Must last 30 days. 06/16/20  Yes Lavina Hamman, MD  ferrous sulfate 325 (65 FE) MG tablet Take 1 tablet (325 mg total) by mouth daily with breakfast. 06/16/20  Yes Lavina Hamman, MD  furosemide (LASIX) 20 MG tablet Take 20 mg by mouth every Monday, Wednesday, and Friday. Monday and Wednesday 11/04/19  Yes [provider]  gabapentin (NEURONTIN) 400 MG capsule Take 1 capsule (400 mg total) by mouth 4 (four) times daily. Patient taking differently: Take 400 mg by mouth 3 (three) times daily. 05/24/18 07/28/21 Yes King, Diona Foley, NP  lisinopril (ZESTRIL) 10 MG tablet Take 10 mg by mouth every morning.  04/28/21  Yes [provider]  meclizine (ANTIVERT) 25 MG tablet Take 1 tablet (25 mg total) by mouth 3 (three) times daily as needed for dizziness. 10/29/20  Yes Fredia Sorrow, MD  meloxicam (MOBIC) 7.5 MG tablet Take 7.5 mg by mouth daily.   Yes [provider]  metoCLOPramide (REGLAN) 5 MG tablet Take 5 mg by mouth 2 (two) times daily as needed for refractory nausea / vomiting.   Yes [provider]  mirtazapine (REMERON) 7.5 MG tablet Take 7.5 mg by mouth at bedtime. 11/04/19  Yes [provider]  Multiple Vitamin (MULTIVITAMIN WITH MINERALS) TABS tablet Take 1 tablet  by mouth daily. 06/17/20  Yes Lavina Hamman, MD  oxybutynin (DITROPAN-XL) 5 MG 24 hr tablet Take 5 mg by mouth at bedtime. 09/06/19  Yes [provider]  pantoprazole (PROTONIX) 40 MG tablet Take 1 tablet (40 mg total) by mouth daily at 6 (six) AM. 04/28/15  Yes Hosie Poisson, MD  raloxifene (EVISTA) 60 MG tablet Take 60 mg by mouth daily.   Yes [provider]  sertraline (ZOLOFT) 100 MG tablet Take 100 mg by mouth in the morning and at bedtime.    Yes [provider]  tiZANidine (ZANAFLEX) 2 MG tablet Take 1 tablet (2 mg total) by mouth every 6 (six) hours as needed. Patient taking differently: Take 2 mg by mouth every 6 (six) hours as needed for muscle spasms. 05/28/19  Yes Donia Ast, PA  traMADol (ULTRAM) 50 MG tablet Take 1 tablet (50 mg total) by mouth 3 (three) times daily as needed for moderate pain. 06/16/20  Yes Lavina Hamman, MD  TRELEGY ELLIPTA 100-62.5-25 MCG/INH AEPB Inhale 1 puff into the lungs daily. 05/14/19  Yes [provider]  cephALEXin (KEFLEX) 500 MG capsule Take 500 mg by mouth 3 (three) times daily. 07/07/21   [provider]    Physical Exam: Vitals:   07/28/21 1530 07/28/21 1630 07/28/21 1700 07/28/21 1730  BP: (!) 143/86 (!) 122/96 (!) 138/116 (!) 153/141  Pulse: (!) 111 (!) 120 (!) 118 (!) 106  Resp: (!) 21    20  Temp:      TempSrc:      SpO2: 91% 91% 91% 90%  Weight:      Height:       General:  Appears calm and comfortable and is in NAD; somewhat dishevelev Eyes:  PERRL, EOMI, normal lids, iris ENT:  grossly normal hearing, lips & tongue, mmm Neck:  no LAD, masses or thyromegaly Cardiovascular:  RRR, no m/r/g. No LE edema.  Respiratory:   scattered rhonchi.  Mildly increased respiratory effort.  + conversational dyspnea Abdomen:  soft, NT, ND Skin:  no rash or induration seen on limited exam Musculoskeletal:  grossly normal tone BUE/BLE, good ROM, no bony abnormality Psychiatric:  eccentric mood and affect, speech fluent and appropriate, AOx3 Neurologic:  CN 2-12 grossly intact, moves all extremities in coordinated fashion   Radiological Exams on Admission: Independently reviewed - see discussion in A/P where applicable  DG Chest 2 View  Result Date: 07/28/2021 CLINICAL DATA:  Shortness of breath EXAM: CHEST - 2 VIEW COMPARISON:  Previous studies including the examination of 05/11/2021 FINDINGS: Transverse diameter of heart is increased. Thoracic aorta is tortuous and ectatic. Apparent shift of mediastinum to the right may be due to rotation. There is decreased volume in right lung. Surgical staples are seen in the right parahilar region. There is interval increase in density in the right parahilar region and right lower lung fields. There is blunting of right lateral CP angle. Left lung is clear. There is no pneumothorax. IMPRESSION: There is interval appearance of patchy infiltrates in right parahilar region and right lower lung fields suggesting pneumonia. Small right pleural effusion. Left lung is clear. Electronically Signed   By: Elmer Picker M.D.   On: 07/28/2021 09:23    EKG: Independently reviewed.  Sinus tachycardia with rate 101; prolonged QTc 577; nonspecific ST changes with no evidence of acute ischemia   Labs on Admission: I have personally reviewed the available labs  and imaging studies at the time of the admission.  Pertinent labs:    K+ 2.2 Glucose 142 BUN 13/Creatinine 1.68/GFR 31; baseline creatinine 1.1-1.2, GFR upper 40s HS troponin 12 WBC 12.4 COVID POSITIVE CRP 15.32 Procalcitonin 15.32   Assessment/Plan * Pneumonia due to COVID-19 virus- (present on admission) -Patient with presenting with worsening SOB and cough  -Usual home O2 requirement (?2-3L) and is currently requiring 3L [place] O2 -COVID POSITIVE -The patient has comorbidities which may increase the risk for ARDS/MODS including: age, HTN, COPD, CKD -Pertinent labs concerning for COVID include markedly elevated CRP (>7) -CXR with multifocal opacities which may be c/w COVID vs. Multifocal PNA -Will treat with broad-spectrum antibiotics given procalcitonin >0.5.   -Will admit for further evaluation, close monitoring, and treatment -Monitor on telemetry x at least 24 hours -At this time, will attempt to avoid use of aerosolized medications and use HFAs instead -Will check daily labs including BMP with Mag, Phos; LFTs; CBC with differential; CRP; ferritin; fibrinogen; D-dimer -Will order steroids and Paxlovid given +COVID test, +CXR, and hypoxia <94%  -If the patient shows clinical deterioration, consider transfer to ICU with PCCM consultation -PT/OT consults -Encourage mobilization/ambulation as much as possible -Patient was seen wearing full PPE including: gown, gloves, N95, and face shield; donning and doffing was in compliance with current standards.  Dyslipidemia- (present on admission) -Continue Lipitor  Chronic pain syndrome- (present on admission) -I have reviewed this patient in the [place] Controlled Substances Reporting System.  She is receiving medications from only one provider and appears to be taking them as prescribed. -She is at high risk of opioid misuse, diversion, or overdose. -Continue Ultram, Zanaflex, Neurontin -Hold Mobic -Will add Oxy if needed for breakthrough  pain  Generalized anxiety disorder- (present on admission) -Also with depression -Continue Zoloft, Remeron, Valium  Adenocarcinoma of right lung, stage 1 ([facility])- (present on admission) -s/p resection -No recent CT f/u so may consider once she improves from current illness  CKD (chronic kidney disease), stage III ([facility]) - baseline SCr 1.3-1.5- (present on admission) -Mild AKI on stage 3a CKD -Will hold Lasix and Lisinopril and follow  Essential hypertension- (present on admission) -Continue Norvasc -Hold Lisinopril  COPD (chronic obstructive pulmonary disease) ([place])- (present on admission) -Continue Trelegy -Despite extensive lung history, current presentation appears more c/w COVID/PNA than COPD    Advance Care Planning:   Code Status: Full Code   Consults: PT/OT/RT  Family Communication: I was unable to reach her son by telephone at the of admission  Severity of Illness: The appropriate patient status for this patient is INPATIENT. Inpatient status is judged to be reasonable and necessary in order to provide the required intensity of service to ensure the patient's safety. The patient's presenting symptoms, physical exam findings, and initial radiographic and laboratory data in the context of their chronic comorbidities is felt to place them at high risk for further clinical deterioration. Furthermore, it is not anticipated that the patient will be medically stable for discharge from the hospital within 2 midnights of admission.   * I certify that at the point of admission it is my clinical judgment that the patient will require inpatient hospital care spanning beyond 2 midnights from the point of admission due to high intensity of service, high risk for further deterioration and high frequency of surveillance required.*  Author: Karmen Bongo, MD 07/28/2021 6:12 PM  For on call review [email or web address].

## 2021-07-28 NOTE — Assessment & Plan Note (Signed)
-  s/p resection -No recent CT f/u so may consider once she improves from current illness

## 2021-07-28 NOTE — ED Notes (Signed)
Medications are not verified at this time

## 2021-07-28 NOTE — ED Notes (Signed)
Spoke with pt's son with POC update.

## 2021-07-28 NOTE — Assessment & Plan Note (Signed)
-  Mild AKI on stage 3a CKD -Will hold Lasix and Lisinopril and follow

## 2021-07-28 NOTE — ED Notes (Signed)
[name] niece [phone] requesting to speak to the patient/ requesting an update

## 2021-07-28 NOTE — Assessment & Plan Note (Signed)
-  Continue Lipitor °

## 2021-07-28 NOTE — Assessment & Plan Note (Signed)
-  Continue Trelegy -Despite extensive lung history, current presentation appears more c/w COVID/PNA than COPD

## 2021-07-28 NOTE — ED Triage Notes (Signed)
BIB EMS for shortness of breath worsening over 2-3 days. Pt with hx lung cancer and COPD. Found 86% on RA by EMS. EMS states pt did not use inhalers, breathing treatments at home today but did use them the last few days. Received 5 albuterol and  125 solumedrol en route. EKG WNL. VS 110/60, 94% on 4LPM, 100 HR, 20R. 18L AC.

## 2021-07-29 ENCOUNTER — Encounter (HOSPITAL_COMMUNITY): Payer: Self-pay | Admitting: Internal Medicine

## 2021-07-29 LAB — COMPREHENSIVE METABOLIC PANEL
ALT: 8 U/L (ref 0–44)
AST: 19 U/L (ref 15–41)
Albumin: 2.4 g/dL — ABNORMAL LOW (ref 3.5–5.0)
Alkaline Phosphatase: 47 U/L (ref 38–126)
Anion gap: 7 (ref 5–15)
BUN: 20 mg/dL (ref 8–23)
CO2: 20 mmol/L — ABNORMAL LOW (ref 22–32)
Calcium: 8.1 mg/dL — ABNORMAL LOW (ref 8.9–10.3)
Chloride: 106 mmol/L (ref 98–111)
Creatinine, Ser: 1.18 mg/dL — ABNORMAL HIGH (ref 0.44–1.00)
GFR, Estimated: 47 mL/min — ABNORMAL LOW (ref >60)
Glucose, Bld: 93 mg/dL (ref 70–99)
Potassium: 4.3 mmol/L (ref 3.5–5.1)
Sodium: 133 mmol/L — ABNORMAL LOW (ref 135–145)
Total Bilirubin: 0.6 mg/dL (ref 0.3–1.2)
Total Protein: 5.9 g/dL — ABNORMAL LOW (ref 6.5–8.1)

## 2021-07-29 LAB — CBC WITH DIFFERENTIAL/PLATELET
Abs Immature Granulocytes: 0 10*3/uL (ref 0.00–0.07)
Band Neutrophils: 22 %
Basophils Absolute: 0 10*3/uL (ref 0.0–0.1)
Basophils Relative: 0 %
Eosinophils Absolute: 0 10*3/uL (ref 0.0–0.5)
Eosinophils Relative: 0 %
HCT: 30.3 % — ABNORMAL LOW (ref 36.0–46.0)
Hemoglobin: 9.5 g/dL — ABNORMAL LOW (ref 12.0–15.0)
Lymphocytes Relative: 3 %
Lymphs Abs: 0.6 10*3/uL — ABNORMAL LOW (ref 0.7–4.0)
MCH: 26.6 pg (ref 26.0–34.0)
MCHC: 31.4 g/dL (ref 30.0–36.0)
MCV: 84.9 fL (ref 80.0–100.0)
Monocytes Absolute: 0.2 10*3/uL (ref 0.1–1.0)
Monocytes Relative: 1 %
Neutro Abs: 18.3 10*3/uL — ABNORMAL HIGH (ref 1.7–7.7)
Neutrophils Relative %: 74 %
Platelets: 264 10*3/uL (ref 150–400)
RBC: 3.57 MIL/uL — ABNORMAL LOW (ref 3.87–5.11)
RDW: 18.9 % — ABNORMAL HIGH (ref 11.5–15.5)
WBC: 19.1 10*3/uL — ABNORMAL HIGH (ref 4.0–10.5)
nRBC: 0 % (ref 0.0–0.2)
nRBC: 1 /100 WBC — ABNORMAL HIGH

## 2021-07-29 LAB — D-DIMER, QUANTITATIVE: D-Dimer, Quant: 8.76 ug/mL-FEU — ABNORMAL HIGH (ref 0.00–0.50)

## 2021-07-29 LAB — FERRITIN: Ferritin: 51 ng/mL (ref 11–307)

## 2021-07-29 LAB — MAGNESIUM: Magnesium: 1.6 mg/dL — ABNORMAL LOW (ref 1.7–2.4)

## 2021-07-29 LAB — C-REACTIVE PROTEIN: CRP: 26.5 mg/dL — ABNORMAL HIGH (ref <1.0)

## 2021-07-29 LAB — PHOSPHORUS: Phosphorus: 1.5 mg/dL — ABNORMAL LOW (ref 2.5–4.6)

## 2021-07-29 MED ORDER — SODIUM CHLORIDE 0.9 % IV SOLN
2.0000 g | INTRAVENOUS | Status: DC
  Administered 2021-07-30 – 2021-07-31 (×2): 2 g via INTRAVENOUS
  Filled 2021-07-29 (×2): qty 20

## 2021-07-29 MED ORDER — AZITHROMYCIN 500 MG PO TABS
500.0000 mg | ORAL_TABLET | Freq: Every day | ORAL | Status: DC
  Administered 2021-07-29 – 2021-07-31 (×3): 500 mg via ORAL
  Filled 2021-07-29 (×3): qty 1

## 2021-07-29 MED ORDER — ENOXAPARIN SODIUM 40 MG/0.4ML IJ SOSY
40.0000 mg | PREFILLED_SYRINGE | INTRAMUSCULAR | Status: DC
  Administered 2021-07-30 – 2021-07-31 (×2): 40 mg via SUBCUTANEOUS
  Filled 2021-07-29 (×2): qty 0.4

## 2021-07-29 MED ORDER — MAGNESIUM SULFATE 2 GM/50ML IV SOLN
2.0000 g | Freq: Once | INTRAVENOUS | Status: AC
Start: 2021-07-29 — End: 2021-07-29
  Administered 2021-07-29: 2 g via INTRAVENOUS
  Filled 2021-07-29: qty 50

## 2021-07-29 MED ORDER — IPRATROPIUM-ALBUTEROL 20-100 MCG/ACT IN AERS
1.0000 | INHALATION_SPRAY | Freq: Four times a day (QID) | RESPIRATORY_TRACT | Status: DC
  Administered 2021-07-29 – 2021-07-31 (×5): 1 via RESPIRATORY_TRACT
  Filled 2021-07-29 (×2): qty 4

## 2021-07-29 NOTE — Evaluation (Signed)
Occupational Therapy Evaluation Patient Details Name: ODALIS JORDAN MRN: [id number] DOB: [birth year] Today's Date: 07/29/2021   History of Present Illness Pt is a 81 y/o F presenting to ED on 1/04 with worsening SOB, CXR shows RLL PNA and COVID +. PMH includes lung cancer, s/p resection, COPD, asthma, CAD, HTN, and HLD.   Clinical Impression   Pt reports independence at baseline with ADLs and uses RW intermittently for functional mobility. Lives with son who is able to provide assistance as needed. Pt currently min A for UB, max A for LB ADLs, pt mod A for bed mobility and mod A for transfers. Pt has posterior lean, uses bedrail to hold self up in sitting. Pt with bowel incontinent episode, deferred further mobility at this time. Pt presenting with impairments listed below, will continue to follow acutely. Recommend SNF vs. HHOT pending pt progress.     Recommendations for follow up therapy are one component of a multi-disciplinary discharge planning process, led by the attending physician.  Recommendations may be updated based on patient status, additional functional criteria and insurance authorization.   Follow Up Recommendations  Skilled nursing-short term rehab (<3 hours/day) (SNF vs. HHOT pending pt progress)    Assistance Recommended at Discharge Frequent or constant Supervision/Assistance  Patient can return home with the following A lot of help with walking and/or transfers;A lot of help with bathing/dressing/bathroom;Assistance with cooking/housework;Help with stairs or ramp for entrance;Assist for transportation    Functional Status Assessment  Patient has had a recent decline in their functional status and demonstrates the ability to make significant improvements in function in a reasonable and predictable amount of time.  Equipment Recommendations  Other (comment);None recommended by OT (pt has all needed DME)    Recommendations for Other Services PT consult     Precautions  / Restrictions Precautions Precautions: Fall Restrictions Weight Bearing Restrictions: No      Mobility Bed Mobility Overal bed mobility: Needs Assistance Bed Mobility: Supine to Sit;Sit to Supine     Supine to sit: Mod assist;HOB elevated Sit to supine: Mod assist;HOB elevated   General bed mobility comments: requires use of bedrail or handheld assist to remain upright when sitting EOB    Transfers Overall transfer level: Needs assistance Equipment used: Rolling walker (2 wheels) Transfers: Sit to/from Stand Sit to Stand: Mod assist           General transfer comment: increased time and cuing for hand placement needed when using RW.      Balance Overall balance assessment: Needs assistance Sitting-balance support: Feet supported;No upper extremity supported Sitting balance-Leahy Scale: Poor Sitting balance - Comments: pt leans posteriorly, requires use of bedrail to stay upright Postural control: Posterior lean Standing balance support: During functional activity;Reliant on assistive device for balance Standing balance-Leahy Scale: Poor                             ADL either performed or assessed with clinical judgement   ADL Overall ADL's : Needs assistance/impaired Eating/Feeding: Set up;Sitting   Grooming: Set up;Sitting   Upper Body Bathing: Minimal assistance;Sitting   Lower Body Bathing: Maximal assistance;Sitting/lateral leans   Upper Body Dressing : Minimal assistance;Sitting   Lower Body Dressing: Maximal assistance;Sitting/lateral leans Lower Body Dressing Details (indicate cue type and reason): attempted in long sitting and sitting EOB Toilet Transfer: Maximal assistance;BSC/3in1;Stand-pivot;Rolling walker (2 wheels) Toilet Transfer Details (indicate cue type and reason): benefits from elevated surface Toileting- Clothing Manipulation  and Hygiene: Maximal assistance;Bed level Toileting - Clothing Manipulation Details (indicate cue  type and reason): bed level pericare     Functional mobility during ADLs: Minimal assistance;Moderate assistance       Vision Baseline Vision/History: 1 Wears glasses Vision Assessment?: No apparent visual deficits     Perception     Praxis      Pertinent Vitals/Pain       Hand Dominance     Extremity/Trunk Assessment Upper Extremity Assessment Upper Extremity Assessment: Generalized weakness   Lower Extremity Assessment Lower Extremity Assessment: Defer to PT evaluation       Communication Communication Communication: No difficulties   Cognition Arousal/Alertness: Awake/alert Behavior During Therapy: WFL for tasks assessed/performed Overall Cognitive Status: No family/caregiver present to determine baseline cognitive functioning                                 General Comments: pt A & O x4, requires increased cuing and reminders throughout session for sequencing     General Comments  Pt on 3L O2 via [place] throughout session, SpO2 above 90% throughout. pt HR 100-105 sitting EOB.    Exercises     Shoulder Instructions      Home Living Family/patient expects to be discharged to:: Private residence Living Arrangements: Children Available Help at Discharge: Family;Available 24 hours/day Type of Home: House Home Access: Stairs to enter [employer] of Steps: " about 4 or 5" Entrance Stairs-Rails: Right Home Layout: One level     Bathroom Shower/Tub: Tub/shower unit;Curtain   Bathroom Toilet: Standard Bathroom Accessibility: Yes   Home Equipment: Conservation officer, nature (2 wheels);Cane - single point;Grab bars - tub/shower;Grab bars - toilet;Shower seat;Wheelchair - manual          Prior Functioning/Environment Prior Level of Function : Independent/Modified Independent               ADLs Comments: does IADLs        OT Problem List: Decreased strength;Decreased range of motion;Decreased activity tolerance;Impaired balance  (sitting and/or standing);Decreased safety awareness;Decreased knowledge of use of DME or AE;Cardiopulmonary status limiting activity      OT Treatment/Interventions: Self-care/ADL training;Therapeutic exercise;DME and/or AE instruction;Therapeutic activities;Patient/family education;Balance training    OT Goals(Current goals can be found in the care plan section) Acute Rehab OT Goals Patient Stated Goal: none stated OT Goal Formulation: With patient Time For Goal Achievement: 08/12/21 Potential to Achieve Goals: Good ADL Goals Pt Will Perform Lower Body Dressing: with min assist;sitting/lateral leans Pt Will Transfer to Toilet: with min assist;bedside commode;stand pivot transfer Pt Will Perform Tub/Shower Transfer: with min assist;ambulating;shower seat;rolling walker  OT Frequency: Min 2X/week    Co-evaluation              AM-PAC OT "6 Clicks" Daily Activity     Outcome Measure Help from another person eating meals?: None Help from another person taking care of personal grooming?: A Little Help from another person toileting, which includes using toliet, bedpan, or urinal?: A Lot Help from another person bathing (including washing, rinsing, drying)?: A Lot Help from another person to put on and taking off regular upper body clothing?: A Little Help from another person to put on and taking off regular lower body clothing?: A Lot 6 Click Score: 16   End of Session Equipment Utilized During Treatment: Gait belt;Rolling walker (2 wheels);Oxygen Nurse Communication: Mobility status;Other (comment)  Activity Tolerance: Patient limited by fatigue Patient left: in  bed;with call bell/phone within reach;with bed alarm set  OT Visit Diagnosis: Unsteadiness on feet (R26.81);Other abnormalities of gait and mobility (R26.89);Muscle weakness (generalized) (M62.81)                Time: 0761-5183 OT Time Calculation (min): 35 min Charges:  OT General Charges $OT Visit: 1 Visit OT  Evaluation $OT Eval Moderate Complexity: 1 Mod OT Treatments $Self Care/Home Management : 8-22 mins  Lynnda Child, OTD, OTR/L Acute Rehab [phone]) [phone] - [place] 07/29/2021, 5:13 PM

## 2021-07-29 NOTE — Progress Notes (Signed)
PROGRESS NOTE  [name] HEN:[id number] DOB: 1941/03/13 DOA: 07/28/2021 PCP: Wenda Low, MD   LOS: 1 day   Brief Narrative / Interim history: 81 year old female with history of COPD on chronic home oxygen, history of lung cancer, uterine cancer, CAD, HTN, HLD, Takotsubo cardiomyopathy comes into the hospital with shortness of breath.  This is been going on over the last couple of days.  She was found to be COVID-positive on admission and requiring 3 L of oxygen.  Chest x-ray showed right lower lobe pneumonia.  Subjective / 24h Interval events: Still feeling short of breath this morning, but overall doing okay.  Assessment & Plan: Principal Problem Acute on chronic hypoxic respiratory failure due to COVID-19 pneumonia-patient admitted to the hospital with worsening shortness of breath and cough.  She is currently requiring 3 L nasal cannula, unclear how much oxygen she is at home.  Given risk factors she was started on Paxlovid as well as steroids, continue -Due to elevated procalcitonin at 15.3, she was also placed on antibiotics, continue  COVID-19 Labs  Recent Labs    07/28/21 1414 07/29/21 0348  DDIMER 5.18* 8.76*  FERRITIN 26 51  LDH 166  --   CRP 8.2* 26.5*   Active Problems Chronic kidney disease, stage IIIa-Baseline creatinine 1.3-1.5, was 1.6 on admission and currently has returned to baseline.  Continue to monitor, avoid nephrotoxins.  COPD with chronic hypoxic respiratory failure-no wheezing, continue medications as below and treating #1  Hyperlipidemia-continue statin  Controlled pain syndrome-continue home medications  Essential hypertension-continue Norvasc, hold lisinopril  History of right lung adenocarcinoma-s/p resection, outpatient management   Generalized anxiety disorder-Also with depression, continue Zoloft, Remeron, Valium    Scheduled Meds:  amLODipine  5 mg Oral Daily   vitamin C  500 mg Oral Daily   aspirin EC  81 mg Oral Daily    atorvastatin  20 mg Oral QPM   docusate sodium  100 mg Oral BID   doxycycline  100 mg Oral Q12H   enoxaparin (LOVENOX) injection  30 mg Subcutaneous Q24H   umeclidinium bromide  1 puff Inhalation Daily   And   fluticasone furoate-vilanterol  1 puff Inhalation Daily   gabapentin  400 mg Oral TID   Ipratropium-Albuterol  1 puff Inhalation Q6H   methylPREDNISolone (SOLU-MEDROL) injection  0.5 mg/kg Intravenous Q12H   Followed by   Derrill Memo ON 07/31/2021] predniSONE  50 mg Oral Daily   mirtazapine  7.5 mg Oral QHS   nirmatrelvir/ritonavir EUA  3 tablet Oral BID   oxybutynin  5 mg Oral QHS   pantoprazole  40 mg Oral Q0600   raloxifene  60 mg Oral Daily   sertraline  100 mg Oral BID   sodium chloride flush  3 mL Intravenous Q12H   sodium chloride flush  3 mL Intravenous Q12H   zinc sulfate  220 mg Oral Daily   Continuous Infusions:  sodium chloride     cefTRIAXone (ROCEPHIN)  IV Stopped (07/29/21 0924)   PRN Meds:.sodium chloride, acetaminophen, albuterol, bisacodyl, chlorpheniramine-HYDROcodone, diazepam, guaiFENesin-dextromethorphan, ondansetron **OR** ondansetron (ZOFRAN) IV, oxyCODONE, polyethylene glycol, sodium chloride flush, sodium phosphate, tiZANidine, traMADol  Diet Orders (From admission, onward)     Start     Ordered   07/28/21 1150  Diet regular Room service appropriate? Yes; Fluid consistency: Thin  Diet effective now       Question Answer Comment  Room service appropriate? Yes   Fluid consistency: Thin      07/28/21 1149  DVT prophylaxis: enoxaparin (LOVENOX) injection 30 mg Start: 07/28/21 1245     Code Status: Full Code  Family Communication: no family at bedside  Status is: Inpatient  Remains inpatient appropriate because: Persistent symptoms, antibiotics, antivirals  Level of care: Telemetry Medical  Consultants:  none  Procedures:  none  Microbiology  Covid +  Antimicrobials: Ceftriaxone, azithromycin   Objective: Vitals:    07/29/21 0553 07/29/21 0600 07/29/21 0800 07/29/21 0900  BP: (!) 145/74 129/77 127/80 (!) 147/88  Pulse: 89 82 98 91  Resp: 17  (!) 21 (!) 22  Temp:      TempSrc:      SpO2: 95% 95% 95% 94%  Weight:      Height:        Intake/Output Summary (Last 24 hours) at 07/29/2021 1044 Last data filed at 07/28/2021 1329 Gross per 24 hour  Intake 291.11 ml  Output --  Net 291.11 ml   Filed Weights   07/28/21 0847  Weight: 68 kg    Examination:  Constitutional: NAD Eyes: no scleral icterus ENMT: Mucous membranes are moist.  Neck: normal, supple Respiratory: Overall distant breath sounds, no wheezing or crackles heard.  Tachypneic Cardiovascular: Regular rate and rhythm, no murmurs / rubs / gallops. No LE edema. Abdomen: non distended, no tenderness. Bowel sounds positive.  Musculoskeletal: no clubbing / cyanosis.  Skin: no rashes Neurologic: CN 2-12 grossly intact. Strength 5/5 in all 4.   Data Reviewed: I have independently reviewed following labs and imaging studies   CBC: Recent Labs  Lab 07/28/21 0851 07/29/21 0348  WBC 12.4* 19.1*  NEUTROABS  --  18.3*  HGB 11.1* 9.5*  HCT 36.5 30.3*  MCV 85.9 84.9  PLT 393 742   Basic Metabolic Panel: Recent Labs  Lab 07/28/21 0851 07/28/21 1010 07/29/21 0348  NA 140  --  133*  K 2.2*  --  4.3  CL 108  --  106  CO2 23  --  20*  GLUCOSE 142*  --  93  BUN 13  --  20  CREATININE 1.68*  --  1.18*  CALCIUM 8.2*  --  8.1*  MG  --  1.7 1.6*  PHOS  --   --  1.5*   Liver Function Tests: Recent Labs  Lab 07/29/21 0348  AST 19  ALT 8  ALKPHOS 47  BILITOT 0.6  PROT 5.9*  ALBUMIN 2.4*   Coagulation Profile: No results for input(s): INR, PROTIME in the last 168 hours. HbA1C: No results for input(s): HGBA1C in the last 72 hours. CBG: No results for input(s): GLUCAP in the last 168 hours.  Recent Results (from the past 240 hour(s))  Resp Panel by RT-PCR (Flu A&B, Covid) Nasopharyngeal Swab     Status: Abnormal   Collection  Time: 07/28/21  8:50 AM   Specimen: Nasopharyngeal Swab; Nasopharyngeal(NP) swabs in vial transport medium  Result Value Ref Range Status   SARS Coronavirus 2 by RT PCR POSITIVE (A) NEGATIVE Final    Comment: (NOTE) SARS-CoV-2 target nucleic acids are DETECTED.  The SARS-CoV-2 RNA is generally detectable in upper respiratory specimens during the acute phase of infection. Positive results are indicative of the presence of the identified virus, but do not rule out bacterial infection or co-infection with other pathogens not detected by the test. Clinical correlation with patient history and other diagnostic information is necessary to determine patient infection status. The expected result is Negative.  Fact Sheet for Patients: EntrepreneurPulse.com.au  Fact Sheet for Healthcare Providers:  IncredibleEmployment.be  This test is not yet approved or cleared by the [place] and  has been authorized for detection and/or diagnosis of SARS-CoV-2 by FDA under an Emergency Use Authorization (EUA).  This EUA will remain in effect (meaning this test can be used) for the duration of  the COVID-19 declaration under Section 564(b)(1) of the A ct, 21 U.S.C. section 360bbb-3(b)(1), unless the authorization is terminated or revoked sooner.     Influenza A by PCR NEGATIVE NEGATIVE Final   Influenza B by PCR NEGATIVE NEGATIVE Final    Comment: (NOTE) The Xpert Xpress SARS-CoV-2/FLU/RSV plus assay is intended as an aid in the diagnosis of influenza from Nasopharyngeal swab specimens and should not be used as a sole basis for treatment. Nasal washings and aspirates are unacceptable for Xpert Xpress SARS-CoV-2/FLU/RSV testing.  Fact Sheet for Patients: EntrepreneurPulse.com.au  Fact Sheet for Healthcare Providers: IncredibleEmployment.be  This test is not yet approved or cleared by the [place] FDA and has been  authorized for detection and/or diagnosis of SARS-CoV-2 by FDA under an Emergency Use Authorization (EUA). This EUA will remain in effect (meaning this test can be used) for the duration of the COVID-19 declaration under Section 564(b)(1) of the Act, 21 U.S.C. section 360bbb-3(b)(1), unless the authorization is terminated or revoked.  Performed at [facility], [place] [address] [place], [place] [address]      Radiology Studies: No results found.   Marzetta Board, MD, PhD [facility]  Between 7 am - 7 pm I am available, please contact me via Amion (for emergencies) or Securechat (non urgent messages)  Between 7 pm - 7 am I am not available, please contact night coverage MD/APP via Amion

## 2021-07-30 DIAGNOSIS — J439 Emphysema, unspecified: Secondary | ICD-10-CM

## 2021-07-30 LAB — CBC WITH DIFFERENTIAL/PLATELET
Abs Immature Granulocytes: 0 10*3/uL (ref 0.00–0.07)
Basophils Absolute: 0.1 10*3/uL (ref 0.0–0.1)
Basophils Relative: 1 %
Eosinophils Absolute: 0 10*3/uL (ref 0.0–0.5)
Eosinophils Relative: 0 %
HCT: 29.7 % — ABNORMAL LOW (ref 36.0–46.0)
Hemoglobin: 9.2 g/dL — ABNORMAL LOW (ref 12.0–15.0)
Lymphocytes Relative: 1 %
Lymphs Abs: 0.1 10*3/uL — ABNORMAL LOW (ref 0.7–4.0)
MCH: 26.1 pg (ref 26.0–34.0)
MCHC: 31 g/dL (ref 30.0–36.0)
MCV: 84.1 fL (ref 80.0–100.0)
Monocytes Absolute: 0.3 10*3/uL (ref 0.1–1.0)
Monocytes Relative: 2 %
Neutro Abs: 14.3 10*3/uL — ABNORMAL HIGH (ref 1.7–7.7)
Neutrophils Relative %: 96 %
Platelets: 274 10*3/uL (ref 150–400)
RBC: 3.53 MIL/uL — ABNORMAL LOW (ref 3.87–5.11)
RDW: 18.9 % — ABNORMAL HIGH (ref 11.5–15.5)
WBC: 14.9 10*3/uL — ABNORMAL HIGH (ref 4.0–10.5)
nRBC: 0 % (ref 0.0–0.2)
nRBC: 0 /100 WBC

## 2021-07-30 LAB — COMPREHENSIVE METABOLIC PANEL
ALT: 11 U/L (ref 0–44)
AST: 16 U/L (ref 15–41)
Albumin: 2.2 g/dL — ABNORMAL LOW (ref 3.5–5.0)
Alkaline Phosphatase: 46 U/L (ref 38–126)
Anion gap: 7 (ref 5–15)
BUN: 28 mg/dL — ABNORMAL HIGH (ref 8–23)
CO2: 23 mmol/L (ref 22–32)
Calcium: 8.3 mg/dL — ABNORMAL LOW (ref 8.9–10.3)
Chloride: 107 mmol/L (ref 98–111)
Creatinine, Ser: 1.16 mg/dL — ABNORMAL HIGH (ref 0.44–1.00)
GFR, Estimated: 48 mL/min — ABNORMAL LOW (ref >60)
Glucose, Bld: 96 mg/dL (ref 70–99)
Potassium: 3.9 mmol/L (ref 3.5–5.1)
Sodium: 137 mmol/L (ref 135–145)
Total Bilirubin: 0.3 mg/dL (ref 0.3–1.2)
Total Protein: 6.5 g/dL (ref 6.5–8.1)

## 2021-07-30 LAB — MAGNESIUM: Magnesium: 2.5 mg/dL — ABNORMAL HIGH (ref 1.7–2.4)

## 2021-07-30 LAB — PHOSPHORUS: Phosphorus: 2.9 mg/dL (ref 2.5–4.6)

## 2021-07-30 LAB — C-REACTIVE PROTEIN: CRP: 27.7 mg/dL — ABNORMAL HIGH (ref <1.0)

## 2021-07-30 LAB — FERRITIN: Ferritin: 52 ng/mL (ref 11–307)

## 2021-07-30 LAB — D-DIMER, QUANTITATIVE: D-Dimer, Quant: 3.96 ug/mL-FEU — ABNORMAL HIGH (ref 0.00–0.50)

## 2021-07-30 NOTE — Plan of Care (Signed)

## 2021-07-30 NOTE — Care Management Important Message (Signed)
Important Message  Patient Details  Name: [name] MRN: [id number] Date of Birth: 11-22-1940   Medicare Important Message Given:  Yes     Hannah Beat 07/30/2021, 11:50 AM

## 2021-07-30 NOTE — Progress Notes (Signed)
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  This is been going on over the last couple of days.  She was found to be COVID-positive on admission and requiring 3 L of oxygen.  Chest x-ray showed right lower lobe pneumonia.  Subjective / 24h Interval events: States that she is feeling better, asking to go home.  Complains of traces of blood in her sputum.  Assessment & Plan: Principal Problem Acute on chronic hypoxic respiratory failure due to COVID-19 pneumonia-patient admitted to the hospital with worsening shortness of breath and cough.  She is currently requiring 3 L nasal cannula, unclear how much oxygen she is at home.  Given risk factors she was started on Paxlovid as well as steroids, continue -Due to elevated procalcitonin at 15.3, she was also placed on antibiotics, continue today -Clinically seems to be better today  COVID-19 Labs  Recent Labs    07/28/21 1414 07/29/21 0348 07/30/21 0301  DDIMER 5.18* 8.76* 3.96*  FERRITIN 26 51 52  LDH 166  --   --   CRP 8.2* 26.5* 27.7*    Active Problems Chronic kidney disease, stage IIIa-Baseline creatinine 1.3-1.5, was 1.6 on admission and currently has returned to baseline.  Remains at baseline this morning  COPD with chronic hypoxic respiratory failure-has a little bit more wheezing this morning, she is on steroids  Hyperlipidemia-continue statin  Controlled pain syndrome-continue home medications  Essential hypertension-continue Norvasc, hold lisinopril  History of right lung adenocarcinoma-s/p resection, outpatient management   Generalized anxiety disorder-Also with depression, continue Zoloft, Remeron, Valium     Scheduled Meds:  amLODipine  5 mg Oral Daily   vitamin C  500 mg Oral Daily   aspirin EC  81 mg Oral Daily   atorvastatin  20 mg Oral QPM   azithromycin  500 mg Oral Daily   docusate sodium  100 mg Oral BID   enoxaparin (LOVENOX) injection  40 mg Subcutaneous Q24H   umeclidinium bromide  1 puff Inhalation Daily   And   fluticasone furoate-vilanterol  1 puff Inhalation Daily   gabapentin  400 mg Oral TID   Ipratropium-Albuterol  1 puff Inhalation Q6H   methylPREDNISolone (SOLU-MEDROL) injection  0.5 mg/kg Intravenous Q12H   Followed by   Derrill Memo ON 07/31/2021] predniSONE  50 mg Oral Daily   mirtazapine  7.5 mg Oral QHS   nirmatrelvir/ritonavir EUA  3 tablet Oral BID   oxybutynin  5 mg Oral QHS   pantoprazole  40 mg Oral Q0600   raloxifene  60 mg Oral Daily   sertraline  100 mg Oral BID   sodium chloride flush  3 mL Intravenous Q12H   sodium chloride flush  3 mL Intravenous Q12H   zinc sulfate  220 mg Oral Daily   Continuous Infusions:  sodium chloride     cefTRIAXone (ROCEPHIN)  IV 2 g (07/30/21 0950)   PRN Meds:.sodium chloride, acetaminophen, albuterol, bisacodyl, chlorpheniramine-HYDROcodone, diazepam, guaiFENesin-dextromethorphan, ondansetron **OR** ondansetron (ZOFRAN) IV, oxyCODONE, polyethylene glycol, sodium chloride flush, sodium phosphate, tiZANidine, traMADol  Diet Orders (From admission, onward)     Start     Ordered   07/28/21 1150  Diet regular Room service appropriate? Yes; Fluid consistency: Thin  Diet effective now  Question Answer Comment  Room service appropriate? Yes   Fluid consistency: Thin      07/28/21 1149            DVT prophylaxis: enoxaparin (LOVENOX) injection 40 mg Start: 07/30/21 1200     Code Status: Full Code  Family Communication: no family at bedside  Status is: Inpatient  Remains inpatient appropriate because: Persistent symptoms, antibiotics, antivirals  Level of care: Telemetry Medical  Consultants:   none  Procedures:  none  Microbiology  Covid +  Antimicrobials: Ceftriaxone, azithromycin   Objective: Vitals:   07/29/21 2039 07/29/21 2053 07/30/21 0751 07/30/21 0821  BP: (!) 185/88 (!) 155/87 (!) 132/112 (!) 156/81  Pulse: 79  96 97  Resp: 16  (!) 27 (!) 24  Temp: 98.9 F (37.2 C)  98.2 F (36.8 C)   TempSrc: Oral  Oral   SpO2: 97%  94%   Weight:      Height:        Intake/Output Summary (Last 24 hours) at 07/30/2021 1040 Last data filed at 07/29/2021 2040 Gross per 24 hour  Intake 320 ml  Output 125 ml  Net 195 ml    Filed Weights   07/28/21 0847  Weight: 68 kg    Examination:  Constitutional: In bed, no apparent distress Eyes: Nonicteric ENMT: Moist mucous membranes Neck: normal, supple Respiratory: Overall distant breath sounds, faint end expiratory wheezing, no crackles.  Tachypneic Cardiovascular: Regular rate and rhythm, no murmurs, no edema Abdomen: Soft, NT, ND, bowel sounds positive Musculoskeletal: no clubbing / cyanosis.  Skin: No rashes seen Neurologic: Nonfocal  Data Reviewed: I have independently reviewed following labs and imaging studies   CBC: Recent Labs  Lab 07/28/21 0851 07/29/21 0348 07/30/21 0301  WBC 12.4* 19.1* 14.9*  NEUTROABS  --  18.3* 14.3*  HGB 11.1* 9.5* 9.2*  HCT 36.5 30.3* 29.7*  MCV 85.9 84.9 84.1  PLT 393 264 161    Basic Metabolic Panel: Recent Labs  Lab 07/28/21 0851 07/28/21 1010 07/29/21 0348 07/30/21 0301  NA 140  --  133* 137  K 2.2*  --  4.3 3.9  CL 108  --  106 107  CO2 23  --  20* 23  GLUCOSE 142*  --  93 96  BUN 13  --  20 28*  CREATININE 1.68*  --  1.18* 1.16*  CALCIUM 8.2*  --  8.1* 8.3*  MG  --  1.7 1.6* 2.5*  PHOS  --   --  1.5* 2.9    Liver Function Tests: Recent Labs  Lab 07/29/21 0348 07/30/21 0301  AST 19 16  ALT 8 11  ALKPHOS 47 46  BILITOT 0.6 0.3  PROT 5.9* 6.5  ALBUMIN 2.4* 2.2*    Coagulation Profile: No results for input(s): INR, PROTIME in the last 168  hours. HbA1C: No results for input(s): HGBA1C in the last 72 hours. CBG: No results for input(s): GLUCAP in the last 168 hours.  Recent Results (from the past 240 hour(s))  Resp Panel by RT-PCR (Flu A&B, Covid) Nasopharyngeal Swab     Status: Abnormal   Collection Time: 07/28/21  8:50 AM   Specimen: Nasopharyngeal Swab; Nasopharyngeal(NP) swabs in vial transport medium  Result Value Ref Range Status   SARS Coronavirus 2 by RT PCR POSITIVE (A) NEGATIVE Final    Comment: (NOTE) SARS-CoV-2 target nucleic acids are DETECTED.  The SARS-CoV-2 RNA is generally detectable in upper respiratory specimens during the acute phase of infection. Positive results are  indicative of the presence of the identified virus, but do not rule out bacterial infection or co-infection with other pathogens not detected by the test. Clinical correlation with patient history and other diagnostic information is necessary to determine patient infection status. The expected result is Negative.  Fact Sheet for Patients: EntrepreneurPulse.com.au  Fact Sheet for Healthcare Providers: IncredibleEmployment.be  This test is not yet approved or cleared by the [place] FDA and  has been authorized for detection and/or diagnosis of SARS-CoV-2 by FDA under an Emergency Use Authorization (EUA).  This EUA will remain in effect (meaning this test can be used) for the duration of  the COVID-19 declaration under Section 564(b)(1) of the A ct, 21 U.S.C. section 360bbb-3(b)(1), unless the authorization is terminated or revoked sooner.     Influenza A by PCR NEGATIVE NEGATIVE Final   Influenza B by PCR NEGATIVE NEGATIVE Final    Comment: (NOTE) The Xpert Xpress SARS-CoV-2/FLU/RSV plus assay is intended as an aid in the diagnosis of influenza from Nasopharyngeal swab specimens and should not be used as a sole basis for treatment. Nasal washings and aspirates are unacceptable for Xpert  Xpress SARS-CoV-2/FLU/RSV testing.  Fact Sheet for Patients: EntrepreneurPulse.com.au  Fact Sheet for Healthcare Providers: IncredibleEmployment.be  This test is not yet approved or cleared by the [place] FDA and has been authorized for detection and/or diagnosis of SARS-CoV-2 by FDA under an Emergency Use Authorization (EUA). This EUA will remain in effect (meaning this test can be used) for the duration of the COVID-19 declaration under Section 564(b)(1) of the Act, 21 U.S.C. section 360bbb-3(b)(1), unless the authorization is terminated or revoked.  Performed at [facility], [place] [address] [place], [place] [address]       Radiology Studies: No results found.   Marzetta Board, MD, PhD [facility]  Between 7 am - 7 pm I am available, please contact me via Amion (for emergencies) or Securechat (non urgent messages)  Between 7 pm - 7 am I am not available, please contact night coverage MD/APP via Amion

## 2021-07-30 NOTE — Evaluation (Signed)
Physical Therapy Evaluation Patient Details Name: [name] MRN: [id number] DOB: [birth year] Today's Date: 07/30/2021  History of Present Illness  Pt is a 82 y/o F presenting to ED on 1/04 with worsening SOB, CXR shows RLL PNA and COVID +. PMH includes lung cancer, s/p resection, COPD, asthma, CAD, HTN, and HLD.  Clinical Impression   Pt admitted with above diagnosis. At baseline, pt is independent.  She does have 24 hr support if needed and necessary DME.  Today, pt with good improvement compared to evaluation yesterday with OT (mod A pivot with OT).  Today, she was able to transfer with min guard assist and ambulated 6'.  She did fatigue easily but on 3 L O2 sats were stable.  Needs cues for safety/line management/focusing on task at hand.  Pt expected to progress well with therapy.  Pt currently with functional limitations due to the deficits listed below (see PT Problem List). Pt will benefit from skilled PT to increase their independence and safety with mobility to allow discharge to the venue listed below.    Of note, pt report feels raw, sore, painful from purewick - so purewick not replaced.  Pt reports she is aware of when she needs to urinate and can call for assist to [facility].  Notified Doctor, general practice.  Pt is min guard-min A , so could transfer to bsc with nursing staff.         Recommendations for follow up therapy are one component of a multi-disciplinary discharge planning process, led by the attending physician.  Recommendations may be updated based on patient status, additional functional criteria and insurance authorization.  Follow Up Recommendations Home health PT    Assistance Recommended at Discharge Frequent or constant Supervision/Assistance  Patient can return home with the following  A little help with walking and/or transfers;A little help with bathing/dressing/bathroom    Equipment Recommendations None recommended by PT  Recommendations for Other Services        Functional Status Assessment Patient has had a recent decline in their functional status and demonstrates the ability to make significant improvements in function in a reasonable and predictable amount of time.     Precautions / Restrictions Precautions Precautions: Fall      Mobility  Bed Mobility Overal bed mobility: Needs Assistance Bed Mobility: Supine to Sit     Supine to sit: Supervision;HOB elevated     General bed mobility comments: assist for lines; cues to complete task (scoot forward)    Transfers Overall transfer level: Needs assistance Equipment used: Rolling walker (2 wheels) Transfers: Sit to/from Stand Sit to Stand: Min guard           General transfer comment: min guard for safety; performed x 2    Ambulation/Gait Ambulation/Gait assistance: Min guard Gait Distance (Feet): 6 Feet Assistive device: Rolling walker (2 wheels) Gait Pattern/deviations: Step-to pattern;Decreased stride length Gait velocity: decreased     General Gait Details: fatigued easily and with DOE of 3/4 but VSS on 3 L; min guard for safety  Stairs            Wheelchair Mobility    Modified Rankin (Stroke Patients Only)       Balance Overall balance assessment: Needs assistance Sitting-balance support: Feet supported;No upper extremity supported Sitting balance-Leahy Scale: Fair Sitting balance - Comments: static balance good without support; when donning shoes pt leaning posteriorly   Standing balance support: During functional activity;No upper extremity supported Standing balance-Leahy Scale: Fair Standing balance comment:  Using RW but could static stand without support                             Pertinent Vitals/Pain Pain Assessment: 0-10 Pain Score: 6  Pain Location: [place] feel raw from purewick Pain Descriptors / Indicators: Burning Pain Intervention(s): Other (comment);Repositioned (did not replace purewick, notified tech and RN)     Home Living Family/patient expects to be discharged to:: Private residence Living Arrangements: Children Available Help at Discharge: Family;Available 24 hours/day Type of Home: House Home Access: Stairs to enter Entrance Stairs-Rails: Right Entrance Stairs-Number of Steps: " about 4 or 5"   Home Layout: One level Home Equipment: Conservation officer, nature (2 wheels);Cane - single point;Grab bars - tub/shower;Grab bars - toilet;Shower seat;Wheelchair - manual      Prior Function Prior Level of Function : Independent/Modified Independent;Driving             Mobility Comments: occasionally uses RW; ambulates short community distances ; uses motorized cart for longer distances ADLs Comments: does IADLs with rest breaks     Hand Dominance   Dominant Hand: Right    Extremity/Trunk Assessment   Upper Extremity Assessment Upper Extremity Assessment: Generalized weakness    Lower Extremity Assessment Lower Extremity Assessment: LLE deficits/detail;RLE deficits/detail RLE Deficits / Details: ROM WFL; MMT 4/5 LLE Deficits / Details: ROM WFL; MMT 4/5    Cervical / Trunk Assessment Cervical / Trunk Assessment: Kyphotic  Communication   Communication: No difficulties  Cognition Arousal/Alertness: Awake/alert Behavior During Therapy: WFL for tasks assessed/performed Overall Cognitive Status: Within Functional Limits for tasks assessed                                 General Comments: Pt does occasionally need redirecting        General Comments General comments (skin integrity, edema, etc.): Pt on 3 L O2 with sats 96% or above    Exercises     Assessment/Plan    PT Assessment Patient needs continued PT services  PT Problem List Decreased strength;Decreased mobility;Decreased safety awareness;Decreased activity tolerance;Cardiopulmonary status limiting activity;Decreased balance;Decreased knowledge of use of DME;Decreased coordination       PT Treatment  Interventions DME instruction;Therapeutic activities;Gait training;Therapeutic exercise;Patient/family education;Stair training;Balance training;Functional mobility training    PT Goals (Current goals can be found in the Care Plan section)  Acute Rehab PT Goals Patient Stated Goal: return home PT Goal Formulation: With patient Time For Goal Achievement: 08/13/21 Potential to Achieve Goals: Good    Frequency Min 3X/week     Co-evaluation               AM-PAC PT "6 Clicks" Mobility  Outcome Measure Help needed turning from your back to your side while in a flat bed without using bedrails?: A Little Help needed moving from lying on your back to sitting on the side of a flat bed without using bedrails?: A Little Help needed moving to and from a bed to a chair (including a wheelchair)?: A Little Help needed standing up from a chair using your arms (e.g., wheelchair or bedside chair)?: A Little Help needed to walk in hospital room?: A Little Help needed climbing 3-5 steps with a railing? : A Little 6 Click Score: 18    End of Session Equipment Utilized During Treatment: Gait belt;Oxygen Activity Tolerance: Patient tolerated treatment well Patient left: with chair alarm set;in chair;with  call bell/phone within reach Nurse Communication: Mobility status;Other (comment) (reports purewick painful/feels raw - states would rather use bathroom/bsc) PT Visit Diagnosis: Other abnormalities of gait and mobility (R26.89);Muscle weakness (generalized) (M62.81)    Time: 0981-1914 PT Time Calculation (min) (ACUTE ONLY): 35 min   Charges:   PT Evaluation $PT Eval Moderate Complexity: 1 Mod PT Treatments $Therapeutic Activity: 8-22 mins        Abran Richard, PT Acute Rehab Services Pager [phone] [facility] [phone]   Karlton Lemon 07/30/2021, 4:45 PM

## 2021-07-31 LAB — COMPREHENSIVE METABOLIC PANEL
ALT: 14 U/L (ref 0–44)
AST: 20 U/L (ref 15–41)
Albumin: 2.3 g/dL — ABNORMAL LOW (ref 3.5–5.0)
Alkaline Phosphatase: 57 U/L (ref 38–126)
Anion gap: 7 (ref 5–15)
BUN: 32 mg/dL — ABNORMAL HIGH (ref 8–23)
CO2: 23 mmol/L (ref 22–32)
Calcium: 8.4 mg/dL — ABNORMAL LOW (ref 8.9–10.3)
Chloride: 109 mmol/L (ref 98–111)
Creatinine, Ser: 1.13 mg/dL — ABNORMAL HIGH (ref 0.44–1.00)
GFR, Estimated: 50 mL/min — ABNORMAL LOW (ref >60)
Glucose, Bld: 82 mg/dL (ref 70–99)
Potassium: 3.8 mmol/L (ref 3.5–5.1)
Sodium: 139 mmol/L (ref 135–145)
Total Bilirubin: 0.3 mg/dL (ref 0.3–1.2)
Total Protein: 6.2 g/dL — ABNORMAL LOW (ref 6.5–8.1)

## 2021-07-31 LAB — CBC WITH DIFFERENTIAL/PLATELET
Abs Immature Granulocytes: 0 10*3/uL (ref 0.00–0.07)
Basophils Absolute: 0 10*3/uL (ref 0.0–0.1)
Basophils Relative: 0 %
Eosinophils Absolute: 0 10*3/uL (ref 0.0–0.5)
Eosinophils Relative: 0 %
HCT: 29.8 % — ABNORMAL LOW (ref 36.0–46.0)
Hemoglobin: 9.4 g/dL — ABNORMAL LOW (ref 12.0–15.0)
Lymphocytes Relative: 2 %
Lymphs Abs: 0.3 10*3/uL — ABNORMAL LOW (ref 0.7–4.0)
MCH: 26.1 pg (ref 26.0–34.0)
MCHC: 31.5 g/dL (ref 30.0–36.0)
MCV: 82.8 fL (ref 80.0–100.0)
Monocytes Absolute: 0 10*3/uL — ABNORMAL LOW (ref 0.1–1.0)
Monocytes Relative: 0 %
Neutro Abs: 12.3 10*3/uL — ABNORMAL HIGH (ref 1.7–7.7)
Neutrophils Relative %: 98 %
Platelets: 291 10*3/uL (ref 150–400)
RBC: 3.6 MIL/uL — ABNORMAL LOW (ref 3.87–5.11)
RDW: 18.7 % — ABNORMAL HIGH (ref 11.5–15.5)
WBC: 12.6 10*3/uL — ABNORMAL HIGH (ref 4.0–10.5)
nRBC: 0 /100 WBC
nRBC: 0.2 % (ref 0.0–0.2)

## 2021-07-31 LAB — PHOSPHORUS: Phosphorus: 3.4 mg/dL (ref 2.5–4.6)

## 2021-07-31 LAB — C-REACTIVE PROTEIN: CRP: 12.3 mg/dL — ABNORMAL HIGH (ref <1.0)

## 2021-07-31 LAB — MAGNESIUM: Magnesium: 2.3 mg/dL (ref 1.7–2.4)

## 2021-07-31 LAB — FERRITIN: Ferritin: 51 ng/mL (ref 11–307)

## 2021-07-31 LAB — D-DIMER, QUANTITATIVE: D-Dimer, Quant: 1.94 ug/mL-FEU — ABNORMAL HIGH (ref 0.00–0.50)

## 2021-07-31 MED ORDER — NIRMATRELVIR/RITONAVIR (PAXLOVID)TABLET: 3.0000 | ORAL_TABLET | Freq: Two times a day (BID) | ORAL | 0 refills | Status: AC

## 2021-07-31 MED ORDER — DOXYCYCLINE HYCLATE 100 MG PO CAPS: 100.0000 mg | ORAL_CAPSULE | Freq: Two times a day (BID) | ORAL | 0 refills | Status: AC

## 2021-07-31 MED ORDER — DEXAMETHASONE 6 MG PO TABS: 6.0000 mg | ORAL_TABLET | Freq: Every day | ORAL | 0 refills | Status: AC

## 2021-07-31 NOTE — Progress Notes (Signed)
SATURATION QUALIFICATIONS: (This note is used to comply with regulatory documentation for home oxygen)  Patient Saturations on Room Air at Rest = 92%  Patient Saturations on Room Air while Ambulating = 84%  Patient Saturations on 2 Liters of oxygen while Ambulating = 95%  Please briefly explain why patient needs home oxygen: Patient has COPD and currently has COVID

## 2021-07-31 NOTE — TOC Transition Note (Signed)
Transition of Care [facility]) - CM/SW Discharge Note   Patient Details  Name: [name] MRN: [id number] Date of Birth: 03-06-1941  [facility] CM/SW Contact:  Bartholomew Crews, RN Phone Number: [phone] 07/31/2021, 12:55 PM   Clinical Narrative:     Patient to transition home today. Spoke with son, [name], on phone identified as "home." Discussed home health needs for PT, OT. Paitent has had [facility] recently - referral accepted by [facility]. Patient does not have home oxygen. Referral to [facility]. [name] to provide transportation home when patient ready. [name] stated that he moved in with patient about 3 years ago to help take care of her. His cousin lives in a camper on the property and is available to assist when needed. No further TOC needs identified at this time.   Final next level of care: [place] Barriers to Discharge: No Barriers Identified   Patient Goals and CMS Choice Patient states their goals for this hospitalization and ongoing recovery are:: returh home with son's support CMS Medicare.gov Compare Post Acute Care list provided to:: Patient Represenative (must comment) (son, [name]) Choice offered to / list presented to : Adult Children  Discharge Placement                       Discharge Plan and Services                DME Arranged: Oxygen DME Agency: [facility] Date DME Agency Contacted: 07/31/21 Time DME Agency Contacted: [id number] Representative spoke with at DME Agency: [place]: PT, OT [place] Agency: [place] Date [place]: 07/31/21 Time [place]: [phone] Representative spoke with at [place]: [place] Determinants of Health ([place]) Interventions     Readmission Risk Interventions No flowsheet data found.

## 2021-07-31 NOTE — Discharge Summary (Signed)
Physician Discharge Summary  [name] WVP:[id number] DOB: 1941-02-28 DOA: 07/28/2021  PCP: Wenda Low, MD  Admit date: 07/28/2021 Discharge date: 07/31/2021  Admitted From: home Disposition:  home  Recommendations for Outpatient Follow-up:  Follow up with PCP in 1-2 weeks  Home Health: PT, OT Equipment/Devices: none  Discharge Condition: stable CODE STATUS: Full code Diet recommendation: regular  HPI: Per admitting MD, [name] is a 81 y.o. female with medical history significant of COPD on home O2; lung cancer (2018); uterine cancer; CAD; HTN; HLD; and Takotsubo cardiomyopathy presenting with SOB.  She reports that, "Honey, I'm sick."  She is on home O2 but is unable to say how much she wears.  She got more SOB the last couple of days.    Hospital Course / Discharge diagnoses: Principal Problem Acute on chronic hypoxic respiratory failure due to COVID-19 pneumonia-patient admitted to the hospital with worsening shortness of breath and cough.  She was placed on antiviral medication as well as steroids.  Due to elevated procalcitonin at 15.3, she was also placed on antibiotics.  She improved with supportive treatment, respiratory status has returned to baseline, she is no longer wheezing, able to work with physical therapy and will be discharged home in stable condition.  She has 2 more days of Paxlovid as well as antibiotics which were prescribed.  She will also be given steroids for total of 10-day course.  Inflammatory markers improving  Active Problems Chronic kidney disease, stage IIIa-Baseline creatinine 1.3-1.5, was 1.6 on admission and currently has returned to baseline COPD with chronic hypoxic respiratory failure-respiratory status is at baseline  Hyperlipidemia-continue statin Chronic pain syndrome-continue home medications Essential hypertension-continue home medications History of right lung adenocarcinoma-s/p resection, outpatient management Generalized  anxiety disorder-Also with depression, continue Zoloft, Remeron, Valium  Sepsis ruled out   Discharge Instructions   Allergies as of 07/31/2021       Reactions   Boniva [ibandronate Sodium] Other (See Comments)   UNSPECIFIED REACTION    Lyrica [pregabalin] Nausea And Vomiting   Dizziness and blurred vision as well   Levofloxacin    Pt states she isn't sure         Medication List     TAKE these medications    acetaminophen 500 MG tablet Commonly known as: TYLENOL Take 2 tablets (1,000 mg total) by mouth every 6 (six) hours. What changed:  when to take this reasons to take this   albuterol 108 (90 Base) MCG/ACT inhaler Commonly known as: VENTOLIN HFA Inhale 2 puffs into the lungs every 6 (six) hours as needed. What changed:  when to take this reasons to take this   aluminum-magnesium hydroxide-simethicone 546-270-35 MG/5ML Susp Commonly known as: MAALOX Take 30 mLs by mouth 3 (three) times daily as needed (indigestion).   amLODipine 5 MG tablet Commonly known as: NORVASC Take 5 mg by mouth daily.   aspirin 81 MG EC tablet Take 1 tablet (81 mg total) by mouth daily. Swallow whole.   atorvastatin 20 MG tablet Commonly known as: LIPITOR Take 20 mg by mouth every evening.   dexamethasone 6 MG tablet Commonly known as: DECADRON Take 1 tablet (6 mg total) by mouth daily for 6 days.   diazepam 5 MG tablet Commonly known as: VALIUM Take 0.5 tablets (2.5 mg total) by mouth every 8 (eight) hours as needed for anxiety. Must last 30 days.   doxycycline 100 MG capsule Commonly known as: VIBRAMYCIN Take 1 capsule (100 mg total) by mouth 2 (  two) times daily for 2 days.   ferrous sulfate 325 (65 FE) MG tablet Take 1 tablet (325 mg total) by mouth daily with breakfast.   furosemide 20 MG tablet Commonly known as: LASIX Take 20 mg by mouth every Monday, Wednesday, and Friday. Monday and Wednesday   gabapentin 400 MG capsule Commonly known as: NEURONTIN Take 1  capsule (400 mg total) by mouth 4 (four) times daily. What changed: when to take this   lisinopril 10 MG tablet Commonly known as: ZESTRIL Take 10 mg by mouth every morning.   meclizine 25 MG tablet Commonly known as: ANTIVERT Take 1 tablet (25 mg total) by mouth 3 (three) times daily as needed for dizziness.   meloxicam 7.5 MG tablet Commonly known as: MOBIC Take 7.5 mg by mouth daily.   metoCLOPramide 5 MG tablet Commonly known as: REGLAN Take 5 mg by mouth 2 (two) times daily as needed for refractory nausea / vomiting.   mirtazapine 7.5 MG tablet Commonly known as: REMERON Take 7.5 mg by mouth at bedtime.   multivitamin with minerals Tabs tablet Take 1 tablet by mouth daily.   nirmatrelvir/ritonavir EUA 20 x 150 MG & 10 x 100MG  Tabs Commonly known as: PAXLOVID Take 3 tablets by mouth 2 (two) times daily for 3 doses. Patient GFR is 50. Take nirmatrelvir (150 mg) two tablets twice daily for 5 days and ritonavir (100 mg) one tablet twice daily for 5 days.   oxybutynin 5 MG 24 hr tablet Commonly known as: DITROPAN-XL Take 5 mg by mouth at bedtime.   pantoprazole 40 MG tablet Commonly known as: PROTONIX Take 1 tablet (40 mg total) by mouth daily at 6 (six) AM.   raloxifene 60 MG tablet Commonly known as: EVISTA Take 60 mg by mouth daily.   sertraline 100 MG tablet Commonly known as: ZOLOFT Take 100 mg by mouth in the morning and at bedtime.   tiZANidine 2 MG tablet Commonly known as: ZANAFLEX Take 1 tablet (2 mg total) by mouth every 6 (six) hours as needed. What changed: reasons to take this   traMADol 50 MG tablet Commonly known as: ULTRAM Take 1 tablet (50 mg total) by mouth 3 (three) times daily as needed for moderate pain.   Trelegy Ellipta 100-62.5-25 MCG/ACT Aepb Generic drug: Fluticasone-Umeclidin-Vilant Inhale 1 puff into the lungs daily.         Consultations: none  Procedures/Studies:  DG Chest 2 View  Result Date: 07/28/2021 CLINICAL  DATA:  Shortness of breath EXAM: CHEST - 2 VIEW COMPARISON:  Previous studies including the examination of 05/11/2021 FINDINGS: Transverse diameter of heart is increased. Thoracic aorta is tortuous and ectatic. Apparent shift of mediastinum to the right may be due to rotation. There is decreased volume in right lung. Surgical staples are seen in the right parahilar region. There is interval increase in density in the right parahilar region and right lower lung fields. There is blunting of right lateral CP angle. Left lung is clear. There is no pneumothorax. IMPRESSION: There is interval appearance of patchy infiltrates in right parahilar region and right lower lung fields suggesting pneumonia. Small right pleural effusion. Left lung is clear. Electronically Signed   By: Elmer Picker M.D.   On: 07/28/2021 09:23     Subjective: - no chest pain, shortness of breath, no abdominal pain, nausea or vomiting.   Discharge Exam: BP (!) 153/83 (BP Location: Left Arm)    Pulse 66    Temp 98 F (36.7 C) (Oral)  Resp 17    Ht 5\' 2"  (1.575 m)    Wt 68 kg    SpO2 97%    BMI 27.44 kg/m   General: Pt is alert, awake, not in acute distress Cardiovascular: RRR, S1/S2 +, no rubs, no gallops Respiratory: CTA bilaterally, no wheezing, no rhonchi Abdominal: Soft, NT, ND, bowel sounds + Extremities: no edema, no cyanosis    The results of significant diagnostics from this hospitalization (including imaging, microbiology, ancillary and laboratory) are listed below for reference.     Microbiology: Recent Results (from the past 240 hour(s))  Resp Panel by RT-PCR (Flu A&B, Covid) Nasopharyngeal Swab     Status: Abnormal   Collection Time: 07/28/21  8:50 AM   Specimen: Nasopharyngeal Swab; Nasopharyngeal(NP) swabs in vial transport medium  Result Value Ref Range Status   SARS Coronavirus 2 by RT PCR POSITIVE (A) NEGATIVE Final    Comment: (NOTE) SARS-CoV-2 target nucleic acids are DETECTED.  The  SARS-CoV-2 RNA is generally detectable in upper respiratory specimens during the acute phase of infection. Positive results are indicative of the presence of the identified virus, but do not rule out bacterial infection or co-infection with other pathogens not detected by the test. Clinical correlation with patient history and other diagnostic information is necessary to determine patient infection status. The expected result is Negative.  Fact Sheet for Patients: EntrepreneurPulse.com.au  Fact Sheet for Healthcare Providers: IncredibleEmployment.be  This test is not yet approved or cleared by the [place] FDA and  has been authorized for detection and/or diagnosis of SARS-CoV-2 by FDA under an Emergency Use Authorization (EUA).  This EUA will remain in effect (meaning this test can be used) for the duration of  the COVID-19 declaration under Section 564(b)(1) of the A ct, 21 U.S.C. section 360bbb-3(b)(1), unless the authorization is terminated or revoked sooner.     Influenza A by PCR NEGATIVE NEGATIVE Final   Influenza B by PCR NEGATIVE NEGATIVE Final    Comment: (NOTE) The Xpert Xpress SARS-CoV-2/FLU/RSV plus assay is intended as an aid in the diagnosis of influenza from Nasopharyngeal swab specimens and should not be used as a sole basis for treatment. Nasal washings and aspirates are unacceptable for Xpert Xpress SARS-CoV-2/FLU/RSV testing.  Fact Sheet for Patients: EntrepreneurPulse.com.au  Fact Sheet for Healthcare Providers: IncredibleEmployment.be  This test is not yet approved or cleared by the [place] FDA and has been authorized for detection and/or diagnosis of SARS-CoV-2 by FDA under an Emergency Use Authorization (EUA). This EUA will remain in effect (meaning this test can be used) for the duration of the COVID-19 declaration under Section 564(b)(1) of the Act, 21 U.S.C. section  360bbb-3(b)(1), unless the authorization is terminated or revoked.  Performed at [facility], [place] [address], [place], [place] [address]      Labs: Basic Metabolic Panel: Recent Labs  Lab 07/28/21 0851 07/28/21 1010 07/29/21 0348 07/30/21 0301 07/31/21 0325  NA 140  --  133* 137 139  K 2.2*  --  4.3 3.9 3.8  CL 108  --  106 107 109  CO2 23  --  20* 23 23  GLUCOSE 142*  --  93 96 82  BUN 13  --  20 28* 32*  CREATININE 1.68*  --  1.18* 1.16* 1.13*  CALCIUM 8.2*  --  8.1* 8.3* 8.4*  MG  --  1.7 1.6* 2.5* 2.3  PHOS  --   --  1.5* 2.9 3.4   Liver Function Tests: Recent  Labs  Lab 07/29/21 0348 07/30/21 0301 07/31/21 0325  AST 19 16 20   ALT 8 11 14   ALKPHOS 47 46 57  BILITOT 0.6 0.3 0.3  PROT 5.9* 6.5 6.2*  ALBUMIN 2.4* 2.2* 2.3*   CBC: Recent Labs  Lab 07/28/21 0851 07/29/21 0348 07/30/21 0301 07/31/21 0325  WBC 12.4* 19.1* 14.9* 12.6*  NEUTROABS  --  18.3* 14.3* 12.3*  HGB 11.1* 9.5* 9.2* 9.4*  HCT 36.5 30.3* 29.7* 29.8*  MCV 85.9 84.9 84.1 82.8  PLT 393 264 274 291   CBG: No results for input(s): GLUCAP in the last 168 hours. Hgb A1c No results for input(s): HGBA1C in the last 72 hours. Lipid Profile No results for input(s): CHOL, HDL, LDLCALC, TRIG, CHOLHDL, LDLDIRECT in the last 72 hours. Thyroid function studies No results for input(s): TSH, T4TOTAL, T3FREE, THYROIDAB in the last 72 hours.  Invalid input(s): FREET3 Urinalysis    Component Value Date/Time   COLORURINE YELLOW 05/11/2021 [place] 05/11/2021 1733   LABSPEC 1.004 (L) 05/11/2021 1733   PHURINE 7.0 05/11/2021 1733   GLUCOSEU NEGATIVE 05/11/2021 1733   HGBUR NEGATIVE 05/11/2021 1733   BILIRUBINUR NEGATIVE 05/11/2021 1733   KETONESUR NEGATIVE 05/11/2021 1733   PROTEINUR NEGATIVE 05/11/2021 1733   UROBILINOGEN 0.2 04/27/2015 1229   NITRITE POSITIVE (A) 05/11/2021 1733   LEUKOCYTESUR SMALL (A) 05/11/2021 1733    FURTHER DISCHARGE INSTRUCTIONS:   Get  Medicines reviewed and adjusted: Please take all your medications with you for your next visit with your Primary MD   Laboratory/radiological data: Please request your Primary MD to go over all hospital tests and procedure/radiological results at the follow up, please ask your Primary MD to get all Hospital records sent to his/her office.   In some cases, they will be blood work, cultures and biopsy results pending at the time of your discharge. Please request that your primary care M.D. goes through all the records of your hospital data and follows up on these results.   Also Note the following: If you experience worsening of your admission symptoms, develop shortness of breath, life threatening emergency, suicidal or homicidal thoughts you must seek medical attention immediately by calling 911 or calling your MD immediately  if symptoms less severe.   You must read complete instructions/literature along with all the possible adverse reactions/side effects for all the Medicines you take and that have been prescribed to you. Take any new Medicines after you have completely understood and accpet all the possible adverse reactions/side effects.    Do not drive when taking Pain medications or sleeping medications (Benzodaizepines)   Do not take more than prescribed Pain, Sleep and Anxiety Medications. It is not advisable to combine anxiety,sleep and pain medications without talking with your primary care practitioner   Special Instructions: If you have smoked or chewed Tobacco  in the last 2 yrs please stop smoking, stop any regular Alcohol  and or any Recreational drug use.   Wear Seat belts while driving.   Please note: You were cared for by a hospitalist during your hospital stay. Once you are discharged, your primary care physician will handle any further medical issues. Please note that NO REFILLS for any discharge medications will be authorized once you are discharged, as it is imperative  that you return to your primary care physician (or establish a relationship with a primary care physician if you do not have one) for your post hospital discharge needs so that they can reassess your  need for medications and monitor your lab values.  Time coordinating discharge: 40 minutes  SIGNED:  Marzetta Board, MD, PhD 07/31/2021, 11:22 AM

## 2021-07-31 NOTE — Progress Notes (Signed)
Patient was discharged to home, AVS reviewed with her and her son. IV's removed. Paxlovid provided for home use, other prescriptions sent to the patient's pharmacy. Home O2 was delivered to the bedside, the patient was instructed on how to use O2 tanks.   Her son provided transportation.

## 2021-08-03 DIAGNOSIS — J441 Chronic obstructive pulmonary disease with (acute) exacerbation: Secondary | ICD-10-CM | POA: Diagnosis not present

## 2021-08-03 DIAGNOSIS — E782 Mixed hyperlipidemia: Secondary | ICD-10-CM | POA: Diagnosis not present

## 2021-08-03 DIAGNOSIS — N183 Chronic kidney disease, stage 3 unspecified: Secondary | ICD-10-CM | POA: Diagnosis not present

## 2021-08-03 DIAGNOSIS — K219 Gastro-esophageal reflux disease without esophagitis: Secondary | ICD-10-CM | POA: Diagnosis not present

## 2021-08-03 DIAGNOSIS — J1282 Pneumonia due to coronavirus disease 2019: Secondary | ICD-10-CM | POA: Diagnosis not present

## 2021-08-03 DIAGNOSIS — I5020 Unspecified systolic (congestive) heart failure: Secondary | ICD-10-CM | POA: Diagnosis not present

## 2021-08-03 DIAGNOSIS — C3491 Malignant neoplasm of unspecified part of right bronchus or lung: Secondary | ICD-10-CM | POA: Diagnosis not present

## 2021-08-03 DIAGNOSIS — I10 Essential (primary) hypertension: Secondary | ICD-10-CM | POA: Diagnosis not present

## 2021-08-03 DIAGNOSIS — R54 Age-related physical debility: Secondary | ICD-10-CM | POA: Diagnosis not present

## 2021-08-03 DIAGNOSIS — D501 Sideropenic dysphagia: Secondary | ICD-10-CM | POA: Diagnosis not present

## 2021-08-03 DIAGNOSIS — G8929 Other chronic pain: Secondary | ICD-10-CM | POA: Diagnosis not present

## 2021-08-03 DIAGNOSIS — I5022 Chronic systolic (congestive) heart failure: Secondary | ICD-10-CM | POA: Diagnosis not present

## 2021-08-09 ENCOUNTER — Encounter (HOSPITAL_BASED_OUTPATIENT_CLINIC_OR_DEPARTMENT_OTHER): Payer: Medicaid Other | Admitting: Internal Medicine

## 2021-08-13 DIAGNOSIS — U071 COVID-19: Secondary | ICD-10-CM | POA: Diagnosis not present

## 2021-08-13 DIAGNOSIS — D649 Anemia, unspecified: Secondary | ICD-10-CM | POA: Diagnosis not present

## 2021-08-13 DIAGNOSIS — J9621 Acute and chronic respiratory failure with hypoxia: Secondary | ICD-10-CM | POA: Diagnosis not present

## 2021-08-17 ENCOUNTER — Encounter (HOSPITAL_BASED_OUTPATIENT_CLINIC_OR_DEPARTMENT_OTHER): Payer: Commercial Managed Care - HMO | Attending: Internal Medicine | Admitting: Internal Medicine

## 2021-09-08 DIAGNOSIS — J441 Chronic obstructive pulmonary disease with (acute) exacerbation: Secondary | ICD-10-CM | POA: Diagnosis not present

## 2021-09-08 DIAGNOSIS — E782 Mixed hyperlipidemia: Secondary | ICD-10-CM | POA: Diagnosis not present

## 2021-09-08 DIAGNOSIS — I10 Essential (primary) hypertension: Secondary | ICD-10-CM | POA: Diagnosis not present

## 2021-09-22 DIAGNOSIS — J1282 Pneumonia due to coronavirus disease 2019: Secondary | ICD-10-CM | POA: Diagnosis not present

## 2021-10-04 DIAGNOSIS — I10 Essential (primary) hypertension: Secondary | ICD-10-CM | POA: Diagnosis not present

## 2021-10-04 DIAGNOSIS — J441 Chronic obstructive pulmonary disease with (acute) exacerbation: Secondary | ICD-10-CM | POA: Diagnosis not present

## 2021-10-04 DIAGNOSIS — E782 Mixed hyperlipidemia: Secondary | ICD-10-CM | POA: Diagnosis not present

## 2021-10-07 DIAGNOSIS — L03116 Cellulitis of left lower limb: Secondary | ICD-10-CM | POA: Diagnosis not present

## 2021-10-07 DIAGNOSIS — L308 Other specified dermatitis: Secondary | ICD-10-CM | POA: Diagnosis not present

## 2021-10-13 DIAGNOSIS — L03116 Cellulitis of left lower limb: Secondary | ICD-10-CM | POA: Diagnosis not present

## 2021-10-13 DIAGNOSIS — L309 Dermatitis, unspecified: Secondary | ICD-10-CM | POA: Diagnosis not present

## 2021-11-01 DIAGNOSIS — J1282 Pneumonia due to coronavirus disease 2019: Secondary | ICD-10-CM | POA: Diagnosis not present

## 2021-11-03 DIAGNOSIS — K219 Gastro-esophageal reflux disease without esophagitis: Secondary | ICD-10-CM | POA: Diagnosis not present

## 2021-11-03 DIAGNOSIS — G8929 Other chronic pain: Secondary | ICD-10-CM | POA: Diagnosis not present

## 2021-11-03 DIAGNOSIS — E782 Mixed hyperlipidemia: Secondary | ICD-10-CM | POA: Diagnosis not present

## 2021-11-03 DIAGNOSIS — J449 Chronic obstructive pulmonary disease, unspecified: Secondary | ICD-10-CM | POA: Diagnosis not present

## 2021-11-03 DIAGNOSIS — N183 Chronic kidney disease, stage 3 unspecified: Secondary | ICD-10-CM | POA: Diagnosis not present

## 2021-11-03 DIAGNOSIS — M810 Age-related osteoporosis without current pathological fracture: Secondary | ICD-10-CM | POA: Diagnosis not present

## 2021-11-03 DIAGNOSIS — I5020 Unspecified systolic (congestive) heart failure: Secondary | ICD-10-CM | POA: Diagnosis not present

## 2021-11-03 DIAGNOSIS — I10 Essential (primary) hypertension: Secondary | ICD-10-CM | POA: Diagnosis not present

## 2021-12-23 DIAGNOSIS — J1282 Pneumonia due to coronavirus disease 2019: Secondary | ICD-10-CM | POA: Diagnosis not present

## 2021-12-29 DIAGNOSIS — N1832 Chronic kidney disease, stage 3b: Secondary | ICD-10-CM | POA: Diagnosis not present

## 2021-12-29 DIAGNOSIS — I700 Atherosclerosis of aorta: Secondary | ICD-10-CM | POA: Diagnosis not present

## 2021-12-29 DIAGNOSIS — E782 Mixed hyperlipidemia: Secondary | ICD-10-CM | POA: Diagnosis not present

## 2021-12-29 DIAGNOSIS — I5020 Unspecified systolic (congestive) heart failure: Secondary | ICD-10-CM | POA: Diagnosis not present

## 2021-12-29 DIAGNOSIS — K219 Gastro-esophageal reflux disease without esophagitis: Secondary | ICD-10-CM | POA: Diagnosis not present

## 2021-12-29 DIAGNOSIS — I5022 Chronic systolic (congestive) heart failure: Secondary | ICD-10-CM | POA: Diagnosis not present

## 2021-12-29 DIAGNOSIS — G8929 Other chronic pain: Secondary | ICD-10-CM | POA: Diagnosis not present

## 2021-12-29 DIAGNOSIS — I10 Essential (primary) hypertension: Secondary | ICD-10-CM | POA: Diagnosis not present

## 2021-12-29 DIAGNOSIS — J449 Chronic obstructive pulmonary disease, unspecified: Secondary | ICD-10-CM | POA: Diagnosis not present

## 2021-12-29 DIAGNOSIS — N390 Urinary tract infection, site not specified: Secondary | ICD-10-CM | POA: Diagnosis not present

## 2022-01-18 ENCOUNTER — Emergency Department (HOSPITAL_COMMUNITY): Payer: Medicare Other

## 2022-01-18 ENCOUNTER — Other Ambulatory Visit: Payer: Self-pay

## 2022-01-18 ENCOUNTER — Emergency Department (HOSPITAL_COMMUNITY)
Admission: EM | Admit: 2022-01-18 | Discharge: 2022-01-18 | Disposition: A | Discharge status: Home / Self Care | Payer: Medicare Other | Attending: Emergency Medicine | Admitting: Emergency Medicine

## 2022-01-18 ENCOUNTER — Encounter (HOSPITAL_COMMUNITY): Payer: Self-pay | Admitting: Emergency Medicine

## 2022-01-18 DIAGNOSIS — I2510 Atherosclerotic heart disease of native coronary artery without angina pectoris: Secondary | ICD-10-CM | POA: Insufficient documentation

## 2022-01-18 DIAGNOSIS — Z9889 Other specified postprocedural states: Secondary | ICD-10-CM | POA: Diagnosis not present

## 2022-01-18 DIAGNOSIS — S0990XA Unspecified injury of head, initial encounter: Secondary | ICD-10-CM

## 2022-01-18 DIAGNOSIS — Z8541 Personal history of malignant neoplasm of cervix uteri: Secondary | ICD-10-CM | POA: Insufficient documentation

## 2022-01-18 DIAGNOSIS — I10 Essential (primary) hypertension: Secondary | ICD-10-CM | POA: Diagnosis not present

## 2022-01-18 DIAGNOSIS — Z79899 Other long term (current) drug therapy: Secondary | ICD-10-CM | POA: Insufficient documentation

## 2022-01-18 DIAGNOSIS — W19XXXA Unspecified fall, initial encounter: Secondary | ICD-10-CM

## 2022-01-18 DIAGNOSIS — S0083XA Contusion of other part of head, initial encounter: Secondary | ICD-10-CM | POA: Diagnosis not present

## 2022-01-18 DIAGNOSIS — M47812 Spondylosis without myelopathy or radiculopathy, cervical region: Secondary | ICD-10-CM | POA: Diagnosis not present

## 2022-01-18 DIAGNOSIS — S199XXA Unspecified injury of neck, initial encounter: Secondary | ICD-10-CM | POA: Diagnosis not present

## 2022-01-18 DIAGNOSIS — J449 Chronic obstructive pulmonary disease, unspecified: Secondary | ICD-10-CM | POA: Diagnosis not present

## 2022-01-18 DIAGNOSIS — M25512 Pain in left shoulder: Secondary | ICD-10-CM | POA: Insufficient documentation

## 2022-01-18 DIAGNOSIS — W01198A Fall on same level from slipping, tripping and stumbling with subsequent striking against other object, initial encounter: Secondary | ICD-10-CM | POA: Diagnosis not present

## 2022-01-18 MED ORDER — ACETAMINOPHEN 325 MG PO TABS
650.0000 mg | ORAL_TABLET | Freq: Once | ORAL | Status: AC
  Administered 2022-01-18: 650 mg via ORAL
  Filled 2022-01-18: qty 2

## 2022-01-18 NOTE — ED Provider Notes (Signed)
[facility] EMERGENCY DEPARTMENT Provider Note   CSN: [id number] Arrival date & time: 01/18/22  3329     History  Chief Complaint  Patient presents with   Fall   Head Injury    [name] is a 81 y.o. female.  81 year old female presents today following a fall that occurred this morning on her porch.  She states she tripped over her broom and a box that was sitting on the ground.  She hit her head on the patio floor.  Currently complaining of hematoma to left forehead, left shoulder pain.  Denies any other injury.  Does not take anticoagulation.  Did not lose consciousness.  The history is provided by the patient. No language interpreter was used.       Home Medications Prior to Admission medications   Medication Sig Start Date End Date Taking? Authorizing Provider  acetaminophen (TYLENOL) 500 MG tablet Take 2 tablets (1,000 mg total) by mouth every 6 (six) hours. Patient taking differently: Take 500 mg by mouth daily as needed (back pain). 05/28/19  Yes Donia Ast, PA  albuterol (PROVENTIL HFA;VENTOLIN HFA) 108 (90 BASE) MCG/ACT inhaler Inhale 2 puffs into the lungs every 6 (six) hours as needed. Patient taking differently: Inhale 2 puffs into the lungs every 4 (four) hours as needed for wheezing or shortness of breath. 04/28/15  Yes Hosie Poisson, MD  aluminum-magnesium hydroxide-simethicone (MAALOX) [phone] MG/5ML SUSP Take 30 mLs by mouth 3 (three) times daily as needed (indigestion).    Yes [provider]  amLODipine (NORVASC) 5 MG tablet Take 5 mg by mouth every morning.   Yes [provider]  atorvastatin (LIPITOR) 20 MG tablet Take 20 mg by mouth at bedtime.   Yes [provider]  diazepam (VALIUM) 5 MG tablet Take 0.5 tablets (2.5 mg total) by mouth every 8 (eight) hours as needed for anxiety. Must last 30 days. 06/16/20  Yes Lavina Hamman, MD  Fluticasone-Umeclidin-Vilant (TRELEGY ELLIPTA) 100-62.5-25 MCG/ACT  AEPB Inhale 1 puff into the lungs every morning.   Yes [provider]  furosemide (LASIX) 20 MG tablet Take 20 mg by mouth every Monday, Wednesday, and Friday. 11/04/19  Yes [provider]  gabapentin (NEURONTIN) 400 MG capsule Take 400 mg by mouth 3 (three) times daily.   Yes [provider]  lisinopril (ZESTRIL) 10 MG tablet Take 10 mg by mouth every morning. 04/28/21  Yes [provider]  meloxicam (MOBIC) 7.5 MG tablet Take 7.5-15 mg by mouth daily as needed for pain.   Yes [provider]  metoCLOPramide (REGLAN) 5 MG tablet Take 5 mg by mouth 2 (two) times daily.   Yes [provider]  mirtazapine (REMERON) 7.5 MG tablet Take 7.5 mg by mouth at bedtime. 11/04/19  Yes [provider]  oxybutynin (DITROPAN-XL) 5 MG 24 hr tablet Take 5 mg by mouth at bedtime. 09/06/19  Yes [provider]  pantoprazole (PROTONIX) 40 MG tablet Take 1 tablet (40 mg total) by mouth daily at 6 (six) AM. Patient taking differently: Take 40 mg by mouth every morning. 04/28/15  Yes Hosie Poisson, MD  raloxifene (EVISTA) 60 MG tablet Take 60 mg by mouth every morning.   Yes [provider]  sertraline (ZOLOFT) 100 MG tablet Take 100 mg by mouth in the morning and at bedtime.    Yes [provider]  traMADol (ULTRAM) 50 MG tablet Take 1 tablet (50 mg total) by mouth 3 (three) times daily as  needed for moderate pain. 06/16/20  Yes Lavina Hamman, MD      Allergies    Nicki [place] sodium], Lyrica [pregabalin], Levofloxacin, and Nitrofurantoin monohyd macro    Review of Systems   Review of Systems  Constitutional:  Negative for chills and fever.  Eyes:  Negative for visual disturbance.  Skin:  Positive for wound.  Neurological:  Negative for syncope and headaches.  All other systems reviewed and are negative.   Physical Exam Updated Vital Signs BP (!) 142/123 (BP Location: Left Arm)   Pulse 74   Temp 98 F (36.7 C)  (Oral)   Resp 16   Ht 5\' 2"  (1.575 m)   Wt 68 kg   SpO2 95%   BMI 27.44 kg/m  Physical Exam Vitals and nursing note reviewed.  Constitutional:      General: She is not in acute distress.    Appearance: Normal appearance. She is not ill-appearing.  HENT:     Head: Normocephalic and atraumatic.     Comments: Bruising and minor skin tear noted to the left side of the forehead.    Nose: Nose normal.  Eyes:     General: No scleral icterus.    Extraocular Movements: Extraocular movements intact.     Conjunctiva/sclera: Conjunctivae normal.  Cardiovascular:     Rate and Rhythm: Normal rate and regular rhythm.     Pulses: Normal pulses.  Pulmonary:     Effort: Pulmonary effort is normal. No respiratory distress.     Breath sounds: Normal breath sounds. No wheezing or rales.  Abdominal:     General: There is no distension.     Palpations: Abdomen is soft.     Tenderness: There is no abdominal tenderness. There is no guarding.  Musculoskeletal:        General: Normal range of motion.     Cervical back: Normal range of motion. No tenderness.     Right lower leg: No edema.     Left lower leg: No edema.     Comments: This patient without tenderness to palpation of her cervical, thoracic, lumbar spine.  Full range of motion in bilateral upper and lower extremities with 5/5 strength in extensor and flexor muscle groups.  Slight tenderness to palpation noted over the left shoulder.  Bruise also noted to left shoulder.  Bruising and skin tear noted to left elbow.  However the left elbow without tenderness to palpation.  Full range of motion present in the left elbow.  Without anatomical snuffbox tenderness in either upper extremity.  Skin:    General: Skin is warm and dry.  Neurological:     General: No focal deficit present.     Mental Status: She is alert. Mental status is at baseline.     ED Results / Procedures / Treatments   Labs (all labs ordered are listed, but only abnormal  results are displayed) Labs Reviewed - No data to display  EKG None  Radiology No results found.  Procedures Procedures    Medications Ordered in ED Medications - No data to display  ED Course/ Medical Decision Making/ A&P                           Medical Decision Making Amount and/or Complexity of Data Reviewed Radiology: ordered.   Medical Decision Making / ED Course   This patient presents to the ED for concern of fall, head injury, this involves an extensive number  of treatment options, and is a complaint that carries with it a high risk of complications and morbidity.  The differential diagnosis includes intracranial hemorrhage, concussion, cervical fracture,   MDM: 81 year old female presents today for evaluation of fall that occurred this morning.  She has a hematoma to the left forehead, bruising to left shoulder, and left elbow.  Minor skin tears noted with no significant laceration or active bleeding.  Has full range of motion of all extremities.  Intact strength.  Will reinforce skin tears with Steri-Strips.  Will obtain CT head, CT cervical spine, and x-ray of left shoulder.  CT scan of the head and cervical spine did not note any acute intracranial finding or cervical fracture.  CT cervical spine does note a chronic C7 anterior wedge deformity.  Given she does not have any pain or limitations in range of motion no intervention is indicated.  This is likely an incidental finding.  Left shoulder x-ray does not note any acute fracture.  Does note moderate osteoarthritis in the glenohumeral joint.  Doubt concussion given patient is without headache, nausea, vomiting or other complaints.  Patient is appropriate for discharge.  Discharged in stable condition.  Discussed follow-up with PCP.   Lab Tests: -I ordered, reviewed, and interpreted labs.   The pertinent results include:   Labs Reviewed - No data to display    EKG  EKG Interpretation  Date/Time:     Ventricular Rate:    PR Interval:    QRS Duration:   QT Interval:    QTC Calculation:   R Axis:     Text Interpretation:           Imaging Studies ordered: I ordered imaging studies including CT head, CT cervical spine, left shoulder x-ray I independently visualized and interpreted imaging. I agree with the radiologist interpretation   Medicines ordered and prescription drug management: No orders of the defined types were placed in this encounter.   -I have reviewed the patients home medicines and have made adjustments as needed  Reevaluation: After the interventions noted above, I reevaluated the patient and found that they have :stayed the same Remained without complaints throughout her emergency room stay  Co morbidities that complicate the patient evaluation  Past Medical History:  Diagnosis Date   Acute metabolic encephalopathy 81/19/1478   Adenocarcinoma of right lung, stage 1 ([place]) 07/28/2016   Anxiety    Cancer ([place])    uterine   Closed fracture of left distal radius 06/13/2020   COPD (chronic obstructive pulmonary disease) ([place])    Coronary artery disease    mild nonobstructive by 2019 cath   Depression    GERD (gastroesophageal reflux disease)    Hyperlipidemia    Hypertension    Low back pain    Osteoarthritis    Osteoporosis    Takotsubo cardiomyopathy    EF recovered to 50-55% by 01/23/18 echo   Vitamin D insufficiency 06/13/2020      Dispostion: Patient is appropriate for discharge.  Discharged in stable condition.  Return precautions discussed.  Final Clinical Impression(s) / ED Diagnoses Final diagnoses:  Injury of head, initial encounter  Fall, initial encounter    Rx / DC Orders ED Discharge Orders     None         Evlyn Courier, PA-C 01/18/22 1014    Lajean Saver, MD 01/19/22 1730

## 2022-01-18 NOTE — ED Triage Notes (Signed)
Patient reports tripping this morning and hitting left side of head on a box. No blood thinners- takes 81mg  aspirin daily. Has large hematoma to left forehead with small laceration. Skin tear to left elbow. Denies LOC.

## 2022-01-24 DIAGNOSIS — R269 Unspecified abnormalities of gait and mobility: Secondary | ICD-10-CM | POA: Diagnosis not present

## 2022-01-24 DIAGNOSIS — S0990XA Unspecified injury of head, initial encounter: Secondary | ICD-10-CM | POA: Diagnosis not present

## 2022-01-24 DIAGNOSIS — I10 Essential (primary) hypertension: Secondary | ICD-10-CM | POA: Diagnosis not present

## 2022-01-24 DIAGNOSIS — S0083XA Contusion of other part of head, initial encounter: Secondary | ICD-10-CM | POA: Diagnosis not present

## 2022-01-28 DIAGNOSIS — E782 Mixed hyperlipidemia: Secondary | ICD-10-CM | POA: Diagnosis not present

## 2022-01-28 DIAGNOSIS — I10 Essential (primary) hypertension: Secondary | ICD-10-CM | POA: Diagnosis not present

## 2022-01-28 DIAGNOSIS — J449 Chronic obstructive pulmonary disease, unspecified: Secondary | ICD-10-CM | POA: Diagnosis not present

## 2022-01-28 DIAGNOSIS — K219 Gastro-esophageal reflux disease without esophagitis: Secondary | ICD-10-CM | POA: Diagnosis not present

## 2022-01-28 DIAGNOSIS — N1832 Chronic kidney disease, stage 3b: Secondary | ICD-10-CM | POA: Diagnosis not present

## 2022-01-28 DIAGNOSIS — G8929 Other chronic pain: Secondary | ICD-10-CM | POA: Diagnosis not present

## 2022-01-31 DIAGNOSIS — J1282 Pneumonia due to coronavirus disease 2019: Secondary | ICD-10-CM | POA: Diagnosis not present

## 2022-02-07 DIAGNOSIS — H524 Presbyopia: Secondary | ICD-10-CM | POA: Diagnosis not present

## 2022-03-02 DIAGNOSIS — K219 Gastro-esophageal reflux disease without esophagitis: Secondary | ICD-10-CM | POA: Diagnosis not present

## 2022-03-02 DIAGNOSIS — E782 Mixed hyperlipidemia: Secondary | ICD-10-CM | POA: Diagnosis not present

## 2022-03-02 DIAGNOSIS — G8929 Other chronic pain: Secondary | ICD-10-CM | POA: Diagnosis not present

## 2022-03-02 DIAGNOSIS — I10 Essential (primary) hypertension: Secondary | ICD-10-CM | POA: Diagnosis not present

## 2022-03-02 DIAGNOSIS — J449 Chronic obstructive pulmonary disease, unspecified: Secondary | ICD-10-CM | POA: Diagnosis not present

## 2022-03-02 DIAGNOSIS — N1832 Chronic kidney disease, stage 3b: Secondary | ICD-10-CM | POA: Diagnosis not present

## 2022-03-03 DIAGNOSIS — J1282 Pneumonia due to coronavirus disease 2019: Secondary | ICD-10-CM | POA: Diagnosis not present

## 2022-03-29 ENCOUNTER — Telehealth: Payer: Self-pay

## 2022-03-30 ENCOUNTER — Ambulatory Visit: Payer: Self-pay

## 2022-04-27 ENCOUNTER — Other Ambulatory Visit: Payer: Self-pay | Admitting: Internal Medicine

## 2022-04-27 DIAGNOSIS — R519 Headache, unspecified: Secondary | ICD-10-CM

## 2022-04-27 DIAGNOSIS — I10 Essential (primary) hypertension: Secondary | ICD-10-CM | POA: Diagnosis not present

## 2022-04-27 DIAGNOSIS — Z23 Encounter for immunization: Secondary | ICD-10-CM | POA: Diagnosis not present

## 2022-04-27 DIAGNOSIS — R42 Dizziness and giddiness: Secondary | ICD-10-CM | POA: Diagnosis not present

## 2022-04-28 ENCOUNTER — Ambulatory Visit
Admission: RE | Admit: 2022-04-28 | Discharge: 2022-04-28 | Disposition: A | Discharge status: Home / Self Care | Payer: Medicare Other | Source: Ambulatory Visit | Attending: Internal Medicine | Admitting: Internal Medicine

## 2022-04-28 DIAGNOSIS — R42 Dizziness and giddiness: Secondary | ICD-10-CM

## 2022-04-28 DIAGNOSIS — R519 Headache, unspecified: Secondary | ICD-10-CM | POA: Diagnosis not present

## 2022-05-15 ENCOUNTER — Encounter (HOSPITAL_COMMUNITY): Payer: Self-pay

## 2022-05-15 ENCOUNTER — Emergency Department (HOSPITAL_COMMUNITY)
Admission: EM | Admit: 2022-05-15 | Discharge: 2022-05-15 | Discharge status: Against Medical Advice | Payer: Medicare Other | Attending: Medical | Admitting: Medical

## 2022-05-15 ENCOUNTER — Emergency Department (HOSPITAL_COMMUNITY): Payer: Medicare Other

## 2022-05-15 DIAGNOSIS — R531 Weakness: Secondary | ICD-10-CM | POA: Insufficient documentation

## 2022-05-15 DIAGNOSIS — K5730 Diverticulosis of large intestine without perforation or abscess without bleeding: Secondary | ICD-10-CM | POA: Diagnosis not present

## 2022-05-15 DIAGNOSIS — R079 Chest pain, unspecified: Secondary | ICD-10-CM | POA: Diagnosis not present

## 2022-05-15 DIAGNOSIS — N281 Cyst of kidney, acquired: Secondary | ICD-10-CM | POA: Diagnosis not present

## 2022-05-15 DIAGNOSIS — Z20822 Contact with and (suspected) exposure to covid-19: Secondary | ICD-10-CM | POA: Diagnosis not present

## 2022-05-15 DIAGNOSIS — R6889 Other general symptoms and signs: Secondary | ICD-10-CM | POA: Diagnosis not present

## 2022-05-15 DIAGNOSIS — Z743 Need for continuous supervision: Secondary | ICD-10-CM | POA: Diagnosis not present

## 2022-05-15 DIAGNOSIS — R109 Unspecified abdominal pain: Secondary | ICD-10-CM | POA: Insufficient documentation

## 2022-05-15 DIAGNOSIS — R0789 Other chest pain: Secondary | ICD-10-CM | POA: Diagnosis not present

## 2022-05-15 DIAGNOSIS — R1111 Vomiting without nausea: Secondary | ICD-10-CM | POA: Diagnosis not present

## 2022-05-15 DIAGNOSIS — Z5321 Procedure and treatment not carried out due to patient leaving prior to being seen by health care provider: Secondary | ICD-10-CM | POA: Diagnosis not present

## 2022-05-15 DIAGNOSIS — R197 Diarrhea, unspecified: Secondary | ICD-10-CM | POA: Diagnosis not present

## 2022-05-15 DIAGNOSIS — R112 Nausea with vomiting, unspecified: Secondary | ICD-10-CM | POA: Diagnosis not present

## 2022-05-15 LAB — RESP PANEL BY RT-PCR (FLU A&B, COVID) ARPGX2
Influenza A by PCR: NEGATIVE
Influenza B by PCR: NEGATIVE
SARS Coronavirus 2 by RT PCR: NEGATIVE

## 2022-05-15 LAB — LIPASE, BLOOD: Lipase: 44 U/L (ref 11–51)

## 2022-05-15 LAB — COMPREHENSIVE METABOLIC PANEL
ALT: 12 U/L (ref 0–44)
AST: 19 U/L (ref 15–41)
Albumin: 3.6 g/dL (ref 3.5–5.0)
Alkaline Phosphatase: 62 U/L (ref 38–126)
Anion gap: 10 (ref 5–15)
BUN: 11 mg/dL (ref 8–23)
CO2: 26 mmol/L (ref 22–32)
Calcium: 8.9 mg/dL (ref 8.9–10.3)
Chloride: 106 mmol/L (ref 98–111)
Creatinine, Ser: 1.11 mg/dL — ABNORMAL HIGH (ref 0.44–1.00)
GFR, Estimated: 51 mL/min — ABNORMAL LOW (ref >60)
Glucose, Bld: 118 mg/dL — ABNORMAL HIGH (ref 70–99)
Potassium: 3.7 mmol/L (ref 3.5–5.1)
Sodium: 142 mmol/L (ref 135–145)
Total Bilirubin: 0.6 mg/dL (ref 0.3–1.2)
Total Protein: 8 g/dL (ref 6.5–8.1)

## 2022-05-15 LAB — CBC
HCT: 42.3 % (ref 36.0–46.0)
Hemoglobin: 13 g/dL (ref 12.0–15.0)
MCH: 25.9 pg — ABNORMAL LOW (ref 26.0–34.0)
MCHC: 30.7 g/dL (ref 30.0–36.0)
MCV: 84.3 fL (ref 80.0–100.0)
Platelets: 402 10*3/uL — ABNORMAL HIGH (ref 150–400)
RBC: 5.02 MIL/uL (ref 3.87–5.11)
RDW: 16.9 % — ABNORMAL HIGH (ref 11.5–15.5)
WBC: 8.7 10*3/uL (ref 4.0–10.5)
nRBC: 0 % (ref 0.0–0.2)

## 2022-05-15 LAB — TROPONIN I (HIGH SENSITIVITY): Troponin I (High Sensitivity): 6 ng/L (ref <18)

## 2022-05-15 MED ORDER — ONDANSETRON 4 MG PO TBDP
4.0000 mg | ORAL_TABLET | Freq: Once | ORAL | Status: AC
  Administered 2022-05-15: 4 mg via ORAL
  Filled 2022-05-15: qty 1

## 2022-05-15 MED ORDER — AMLODIPINE BESYLATE 5 MG PO TABS
2.5000 mg | ORAL_TABLET | Freq: Once | ORAL | Status: AC
  Administered 2022-05-15: 2.5 mg via ORAL
  Filled 2022-05-15: qty 1

## 2022-05-15 MED ORDER — ASPIRIN 81 MG PO CHEW
324.0000 mg | CHEWABLE_TABLET | Freq: Once | ORAL | Status: AC
  Administered 2022-05-15: 324 mg via ORAL
  Filled 2022-05-15: qty 4

## 2022-05-15 MED ORDER — IOHEXOL 300 MG/ML  SOLN
100.0000 mL | Freq: Once | INTRAMUSCULAR | Status: AC | PRN
  Administered 2022-05-15: 100 mL via INTRAVENOUS

## 2022-05-15 MED ORDER — HYDROCODONE-ACETAMINOPHEN 5-325 MG PO TABS
1.0000 | ORAL_TABLET | Freq: Once | ORAL | Status: AC
  Administered 2022-05-15: 1 via ORAL
  Filled 2022-05-15: qty 1

## 2022-05-15 NOTE — ED Triage Notes (Signed)
Pt presents with c/o abdominal pain that started yesterday and N/V/D that started this morning. Pt also c/o weakness that has been ongoing for a week. When pt arrived to triage room, she started c/o "fluttering" in her chest. Vitals for EMS, 160/100, P86, SPO2 96, CBG 157.

## 2022-05-15 NOTE — ED Provider Triage Note (Signed)
Emergency Medicine Provider Triage Evaluation Note  [name] , a 82 y.o. female  was evaluated in triage.  Pt complains of  chest pain radiating to abdomen with associated nausea/vomitingx1 day. Has fluttering in chest as well. Endorses weakness. Too her medications today. Abdominal pain waxes and wanes.   Review of Systems  Positive: Nausea, vomiting, chest pain, abdominal pain Negative: Constipation  Physical Exam  There were no vitals taken for this visit. Gen:   Awake, no distress   Resp:  Normal effort  MSK:   Moves extremities without difficulty  Other:  Diffuse abdominal pain w/guarding  Medical Decision Making  Medically screening exam initiated at 11:45 AM.  Appropriate orders placed.  [name] was informed that the remainder of the evaluation will be completed by another provider, this initial triage assessment does not replace that evaluation, and the importance of remaining in the ED until their evaluation is complete.   Osvaldo Shipper, [place] 05/15/22 1153

## 2022-05-16 ENCOUNTER — Other Ambulatory Visit: Payer: Self-pay

## 2022-05-16 ENCOUNTER — Encounter (HOSPITAL_COMMUNITY): Payer: Self-pay | Admitting: Emergency Medicine

## 2022-05-16 ENCOUNTER — Emergency Department (HOSPITAL_COMMUNITY)
Admission: EM | Admit: 2022-05-16 | Discharge: 2022-05-17 | Disposition: A | Discharge status: Home / Self Care | Payer: Medicare Other | Attending: Emergency Medicine | Admitting: Emergency Medicine

## 2022-05-16 DIAGNOSIS — Z85118 Personal history of other malignant neoplasm of bronchus and lung: Secondary | ICD-10-CM | POA: Insufficient documentation

## 2022-05-16 DIAGNOSIS — R9431 Abnormal electrocardiogram [ECG] [EKG]: Secondary | ICD-10-CM | POA: Diagnosis not present

## 2022-05-16 DIAGNOSIS — E876 Hypokalemia: Secondary | ICD-10-CM | POA: Insufficient documentation

## 2022-05-16 DIAGNOSIS — R079 Chest pain, unspecified: Secondary | ICD-10-CM | POA: Diagnosis not present

## 2022-05-16 DIAGNOSIS — Z743 Need for continuous supervision: Secondary | ICD-10-CM | POA: Diagnosis not present

## 2022-05-16 DIAGNOSIS — R112 Nausea with vomiting, unspecified: Secondary | ICD-10-CM | POA: Insufficient documentation

## 2022-05-16 DIAGNOSIS — R1013 Epigastric pain: Secondary | ICD-10-CM | POA: Insufficient documentation

## 2022-05-16 DIAGNOSIS — Z981 Arthrodesis status: Secondary | ICD-10-CM | POA: Diagnosis not present

## 2022-05-16 DIAGNOSIS — R6889 Other general symptoms and signs: Secondary | ICD-10-CM | POA: Diagnosis not present

## 2022-05-16 DIAGNOSIS — R1084 Generalized abdominal pain: Secondary | ICD-10-CM | POA: Diagnosis not present

## 2022-05-16 DIAGNOSIS — R197 Diarrhea, unspecified: Secondary | ICD-10-CM | POA: Diagnosis not present

## 2022-05-16 DIAGNOSIS — R1010 Upper abdominal pain, unspecified: Secondary | ICD-10-CM | POA: Diagnosis not present

## 2022-05-16 DIAGNOSIS — R109 Unspecified abdominal pain: Secondary | ICD-10-CM | POA: Diagnosis not present

## 2022-05-16 DIAGNOSIS — Z9889 Other specified postprocedural states: Secondary | ICD-10-CM | POA: Diagnosis not present

## 2022-05-16 DIAGNOSIS — N3000 Acute cystitis without hematuria: Secondary | ICD-10-CM | POA: Insufficient documentation

## 2022-05-16 LAB — COMPREHENSIVE METABOLIC PANEL WITH GFR
ALT: 10 U/L (ref 0–44)
AST: 18 U/L (ref 15–41)
Albumin: 3.4 g/dL — ABNORMAL LOW (ref 3.5–5.0)
Alkaline Phosphatase: 58 U/L (ref 38–126)
Anion gap: 10 (ref 5–15)
BUN: 11 mg/dL (ref 8–23)
CO2: 24 mmol/L (ref 22–32)
Calcium: 8.5 mg/dL — ABNORMAL LOW (ref 8.9–10.3)
Chloride: 102 mmol/L (ref 98–111)
Creatinine, Ser: 1.19 mg/dL — ABNORMAL HIGH (ref 0.44–1.00)
GFR, Estimated: 47 mL/min — ABNORMAL LOW
Glucose, Bld: 102 mg/dL — ABNORMAL HIGH (ref 70–99)
Potassium: 3.2 mmol/L — ABNORMAL LOW (ref 3.5–5.1)
Sodium: 136 mmol/L (ref 135–145)
Total Bilirubin: 0.5 mg/dL (ref 0.3–1.2)
Total Protein: 7.4 g/dL (ref 6.5–8.1)

## 2022-05-16 LAB — CBC WITH DIFFERENTIAL/PLATELET
Abs Immature Granulocytes: 0.03 K/uL (ref 0.00–0.07)
Basophils Absolute: 0.1 K/uL (ref 0.0–0.1)
Basophils Relative: 1 %
Eosinophils Absolute: 0.1 K/uL (ref 0.0–0.5)
Eosinophils Relative: 1 %
HCT: 38.5 % (ref 36.0–46.0)
Hemoglobin: 12.1 g/dL (ref 12.0–15.0)
Immature Granulocytes: 0 %
Lymphocytes Relative: 19 %
Lymphs Abs: 1.8 K/uL (ref 0.7–4.0)
MCH: 26.1 pg (ref 26.0–34.0)
MCHC: 31.4 g/dL (ref 30.0–36.0)
MCV: 83 fL (ref 80.0–100.0)
Monocytes Absolute: 0.6 K/uL (ref 0.1–1.0)
Monocytes Relative: 6 %
Neutro Abs: 6.6 K/uL (ref 1.7–7.7)
Neutrophils Relative %: 73 %
Platelets: 377 K/uL (ref 150–400)
RBC: 4.64 MIL/uL (ref 3.87–5.11)
RDW: 17.1 % — ABNORMAL HIGH (ref 11.5–15.5)
WBC: 9.2 K/uL (ref 4.0–10.5)
nRBC: 0 % (ref 0.0–0.2)

## 2022-05-16 LAB — LIPASE, BLOOD: Lipase: 32 U/L (ref 11–51)

## 2022-05-16 LAB — TROPONIN I (HIGH SENSITIVITY): Troponin I (High Sensitivity): 8 ng/L (ref <18)

## 2022-05-16 NOTE — ED Provider Triage Note (Signed)
Emergency Medicine Provider Triage Evaluation Note  [name] , a 81 y.o. female  was evaluated in triage.  Pt complains of abdominal pain.  Started a couple days ago.  Located in the epigastric region and radiates to her chest and sometimes to her back.  Pain is sharp.  Endorses nausea, vomiting diarrhea.  Diarrhea and vomiting is nonbloody.  Denies hematuria.  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  Review of Systems  Positive: See above Negative: See above  Physical Exam  BP (!) 184/101 (BP Location: Left Arm)   Pulse 85   Temp 97.9 F (36.6 C) (Oral)   Resp 15   Ht 5\' 2"  (1.575 m)   Wt 68 kg Comment: Simultaneous filing. User may not have seen previous data.  SpO2 96%   BMI 27.44 kg/m  Gen:   Awake, no distress   Resp:  Normal effort  MSK:   Moves extremities without difficulty  Other:  Pregastric tenderness  Medical Decision Making  Medically screening exam initiated at 10:11 PM.  Appropriate orders placed.  PEITYN [name] was informed that the remainder of the evaluation will be completed by another provider, this initial triage assessment does not replace that evaluation, and the importance of remaining in the ED until their evaluation is complete.  Work up initiated   Felicia Pho, PA-C 05/16/22 2213

## 2022-05-16 NOTE — ED Notes (Signed)
Needs IV for CT  

## 2022-05-16 NOTE — ED Triage Notes (Signed)
Pt in from home via [facility] for n/v, upper abd pain and UTI s/s x 3 days. States she has had coffee ground emesis that also began 3 days ago. 220/100 en route per EMS. 4mg  Zofran given en route

## 2022-05-17 ENCOUNTER — Emergency Department (HOSPITAL_COMMUNITY): Payer: Medicare Other

## 2022-05-17 DIAGNOSIS — R079 Chest pain, unspecified: Secondary | ICD-10-CM | POA: Diagnosis not present

## 2022-05-17 DIAGNOSIS — R1013 Epigastric pain: Secondary | ICD-10-CM | POA: Diagnosis not present

## 2022-05-17 DIAGNOSIS — Z9889 Other specified postprocedural states: Secondary | ICD-10-CM | POA: Diagnosis not present

## 2022-05-17 DIAGNOSIS — Z981 Arthrodesis status: Secondary | ICD-10-CM | POA: Diagnosis not present

## 2022-05-17 LAB — URINALYSIS, ROUTINE W REFLEX MICROSCOPIC
Bilirubin Urine: NEGATIVE
Glucose, UA: NEGATIVE mg/dL
Hgb urine dipstick: NEGATIVE
Ketones, ur: NEGATIVE mg/dL
Nitrite: NEGATIVE
Protein, ur: 100 mg/dL — AB
Specific Gravity, Urine: 1.015 (ref 1.005–1.030)
pH: 6 (ref 5.0–8.0)

## 2022-05-17 LAB — POC OCCULT BLOOD, ED: Fecal Occult Bld: POSITIVE — AB

## 2022-05-17 MED ORDER — PANTOPRAZOLE INFUSION (NEW) - SIMPLE MED
8.0000 mg/h | INTRAVENOUS | Status: DC
  Filled 2022-05-17: qty 100

## 2022-05-17 MED ORDER — PANTOPRAZOLE SODIUM 40 MG PO TBEC: 40.0000 mg | DELAYED_RELEASE_TABLET | Freq: Every day | ORAL | 0 refills | Status: DC

## 2022-05-17 MED ORDER — PANTOPRAZOLE SODIUM 40 MG IV SOLR
40.0000 mg | Freq: Once | INTRAVENOUS | Status: AC
  Administered 2022-05-17: 40 mg via INTRAVENOUS
  Filled 2022-05-17: qty 10

## 2022-05-17 MED ORDER — CEPHALEXIN 500 MG PO CAPS: 500.0000 mg | ORAL_CAPSULE | Freq: Four times a day (QID) | ORAL | 0 refills | Status: DC

## 2022-05-17 MED ORDER — PANTOPRAZOLE SODIUM 40 MG IV SOLR: 40.0000 mg | Freq: Once | INTRAVENOUS | Status: DC

## 2022-05-17 MED ORDER — SODIUM CHLORIDE 0.9 % IV SOLN: 1.0000 g | Freq: Once | INTRAVENOUS | Status: DC

## 2022-05-17 NOTE — ED Provider Notes (Signed)
I provided a substantive portion of the care of this patient.  I personally performed the entirety of the medical decision making for this encounter.      Patient reports that she vomited something dark about 3 days ago.  She says it looked like coffee grounds.  She reports she has not vomited since then.  Patient reports something like this happened a number of years ago once but she never had any subsequent problems.  She does not have any known history of gastric ulcer or GI bleed that she is aware of.  Patient reports she takes a daily aspirin but does not take any other NSAIDs or anticoagulants.  Denies she is having any abdominal pain.  Patient is alert nontoxic.  Well-nourished well-developed for age.  Clinically well in appearance.  Lungs are grossly clear.  No respiratory distress.  Heart is regular.  Abdomen soft and nontender.  skin warm and dry.  Patient's vital signs are stable with no hypotension or tachycardia.  Globin is 12.1.  This is higher than her previous values.  Stool testing is positive but stool itself is not melanotic.  At this time, patient appears to be low risk for aggressive GI bleed.  She is not on anticoagulants.  We will have her hold aspirin and start a PPI with close follow-up with Dr. Deforest Hoyles.  Patient lives home with her son and has good mental status for monitoring symptoms at home.  return precautions reviewed and follow-up plan for recheck ASAP.   Charlesetta Shanks, MD 05/17/22 1054

## 2022-05-17 NOTE — Discharge Instructions (Addendum)
You were evaluated in the ER for your nausea, vomiting, and diarrhea.  Your laboratory work-up and imaging is reassuring that you do not have a significant GI bleed, despite your occult test being positive.  You did test positive for a urinary tract infection, which we will treat with oral antibiotics.  We will also start you on daily Protonix.  Please take both medicines as prescribed.  I recommend close follow-up with your PCP.  Stop taking your daily aspirin until you are able to follow-up with Dr. Lysle Rubens.  Avoid taking other NSAIDs such as ibuprofen and naproxen.  Return to the ER if you develop new or worsening symptoms such as coffee-ground emesis, dark tarry stool, severe abdominal pain, weakness, or fever >100.33F.

## 2022-05-17 NOTE — ED Provider Notes (Signed)
[facility] DEPT Provider Note   CSN: [id number] Arrival date & time: 05/16/22  1902     History  No chief complaint on file.   [name] is a 81 y.o. female who presents to the ER via EMS for nausea, vomiting, upper abdominal pain, dark color diarrhea, and some urinary incontinence and increased urgency x3 days.  Patient was in ER on 05/15/2022 for same and eloped.  She states she has had "coffee-ground diarrhea" but denies coffee-ground emesis.  She reports her pain is sharp, in the epigastric region, radiates to her rib cage or back, and is worse with deep inspiration.  Denies hematuria, fever, chills, weakness, syncope, headache, lightheadedness, chest pain, shortness of breath.  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  She does take daily aspirin.       Home Medications Prior to Admission medications   Medication Sig Start Date End Date Taking? Authorizing Provider  cephALEXin (KEFLEX) 500 MG capsule Take 1 capsule (500 mg total) by mouth 4 (four) times daily. 05/17/22  Yes Yolani Vo R, PA  pantoprazole (PROTONIX) 40 MG tablet Take 1 tablet (40 mg total) by mouth daily before breakfast. 05/17/22 06/16/22 Yes Jabrea Kallstrom R, PA  acetaminophen (TYLENOL) 500 MG tablet Take 2 tablets (1,000 mg total) by mouth every 6 (six) hours. Patient taking differently: Take 500 mg by mouth daily as needed (back pain). 05/28/19   Donia Ast, PA  albuterol (PROVENTIL HFA;VENTOLIN HFA) 108 (90 BASE) MCG/ACT inhaler Inhale 2 puffs into the lungs every 6 (six) hours as needed. Patient taking differently: Inhale 2 puffs into the lungs every 4 (four) hours as needed for wheezing or shortness of breath. 04/28/15   Hosie Poisson, MD  aluminum-magnesium hydroxide-simethicone (MAALOX) 409-811-91 MG/5ML SUSP Take 30 mLs by mouth 3 (three) times daily as needed (indigestion).     [provider]   amLODipine (NORVASC) 5 MG tablet Take 5 mg by mouth every morning.    [provider]  atorvastatin (LIPITOR) 20 MG tablet Take 20 mg by mouth at bedtime.    [provider]  diazepam (VALIUM) 5 MG tablet Take 0.5 tablets (2.5 mg total) by mouth every 8 (eight) hours as needed for anxiety. Must last 30 days. 06/16/20   Lavina Hamman, MD  Fluticasone-Umeclidin-Vilant (TRELEGY ELLIPTA) 100-62.5-25 MCG/ACT AEPB Inhale 1 puff into the lungs every morning.    [provider]  furosemide (LASIX) 20 MG tablet Take 20 mg by mouth every Monday, Wednesday, and Friday. 11/04/19   [provider]  gabapentin (NEURONTIN) 400 MG capsule Take 400 mg by mouth 3 (three) times daily.    [provider]  lisinopril (ZESTRIL) 10 MG tablet Take 10 mg by mouth every morning. 04/28/21   [provider]  meloxicam (MOBIC) 7.5 MG tablet Take 7.5-15 mg by mouth daily as needed for pain.    [provider]  metoCLOPramide (REGLAN) 5 MG tablet Take 5 mg by mouth 2 (two) times daily.    [provider]  mirtazapine (REMERON) 7.5 MG tablet Take 7.5 mg by mouth at bedtime. 11/04/19   [provider]  oxybutynin (DITROPAN-XL) 5 MG 24 hr tablet Take 5 mg by mouth at bedtime. 09/06/19   [provider]  raloxifene (EVISTA) 60 MG tablet Take 60 mg by mouth every morning.    [provider]  sertraline (ZOLOFT) 100 MG tablet Take 100 mg by mouth in  the morning and at bedtime.     [provider]  traMADol (ULTRAM) 50 MG tablet Take 1 tablet (50 mg total) by mouth 3 (three) times daily as needed for moderate pain. 06/16/20   Lavina Hamman, MD      Allergies    Nicki [place] sodium], Lyrica [pregabalin], Levofloxacin, and Nitrofurantoin monohyd macro    Review of Systems   Review of Systems  Constitutional:  Negative for chills and fever.  Respiratory:  Negative for chest tightness and shortness of breath.    Cardiovascular:  Negative for chest pain and palpitations.  Gastrointestinal:  Positive for abdominal pain, diarrhea, nausea and vomiting. Negative for abdominal distention.  Genitourinary:  Positive for urgency. Negative for dysuria, flank pain and hematuria.    Physical Exam Updated Vital Signs BP (!) 169/89   Pulse 67   Temp 98.1 F (36.7 C) (Oral)   Resp 19   Ht 5\' 2"  (1.575 m)   Wt 68 kg Comment: Simultaneous filing. User may not have seen previous data.  SpO2 97%   BMI 27.44 kg/m  Physical Exam Vitals and nursing note reviewed.  Constitutional:      General: She is not in acute distress.    Appearance: She is not ill-appearing or toxic-appearing.  HENT:     Mouth/Throat:     Mouth: Mucous membranes are moist.     Pharynx: Oropharynx is clear.  Cardiovascular:     Rate and Rhythm: Normal rate and regular rhythm.     Pulses: Normal pulses.     Heart sounds: Normal heart sounds.  Pulmonary:     Effort: Pulmonary effort is normal. No respiratory distress.     Breath sounds: Normal breath sounds and air entry.  Abdominal:     General: Abdomen is flat. Bowel sounds are increased. There is no distension.     Palpations: Abdomen is soft.     Tenderness: There is abdominal tenderness in the right upper quadrant, epigastric area and suprapubic area.     Comments: Increased bowel sounds auscultated in RUQ.  Skin:    General: Skin is warm and dry.     Capillary Refill: Capillary refill takes less than 2 seconds.  Neurological:     Mental Status: She is alert. Mental status is at baseline.  Psychiatric:        Mood and Affect: Mood normal.        Behavior: Behavior normal.     ED Results / Procedures / Treatments   Labs (all labs ordered are listed, but only abnormal results are displayed) Labs Reviewed  COMPREHENSIVE METABOLIC PANEL - Abnormal; Notable for the following components:      Result Value   Potassium 3.2 (*)    Glucose, Bld 102 (*)    Creatinine, Ser  1.19 (*)    Calcium 8.5 (*)    Albumin 3.4 (*)    GFR, Estimated 47 (*)    All other components within normal limits  CBC WITH DIFFERENTIAL/PLATELET - Abnormal; Notable for the following components:   RDW 17.1 (*)    All other components within normal limits  URINALYSIS, ROUTINE W REFLEX MICROSCOPIC - Abnormal; Notable for the following components:   Color, Urine AMBER (*)    APPearance CLOUDY (*)    Protein, ur 100 (*)    Leukocytes,Ua SMALL (*)    Bacteria, UA MANY (*)    All other components within normal limits  POC OCCULT BLOOD, ED - Abnormal; Notable for the  following components:   Fecal Occult Bld POSITIVE (*)    All other components within normal limits  LIPASE, BLOOD  PROTIME-INR    EKG None  Radiology DG Abdomen Acute W/Chest  Result Date: 05/17/2022 CLINICAL DATA:  Epigastric pain radiating to the chest and back. EXAM: DG ABDOMEN ACUTE WITH 1 VIEW CHEST COMPARISON:  CT abdomen pelvis and chest x-ray dated May 15, 2022. FINDINGS: There is no evidence of dilated bowel loops or free intraperitoneal air. No radiopaque calculi or other significant radiographic abnormality is seen. Prior cholecystectomy. Stable cardiomediastinal silhouette with normal heart size. Similar postsurgical changes in the right lung from prior lobectomy with blunting of the right costophrenic angle. No focal consolidation, pleural effusion, or pneumothorax. Prior L4-L5 fusion and left proximal femur screw fixation. IMPRESSION: 1. No acute findings. Electronically Signed   By: Titus Dubin M.D.   On: 05/17/2022 10:17   CT ABDOMEN PELVIS W CONTRAST  Result Date: 05/15/2022 CLINICAL DATA:  Abdominal pain EXAM: CT ABDOMEN AND PELVIS WITH CONTRAST TECHNIQUE: Multidetector CT imaging of the abdomen and pelvis was performed using the standard protocol following bolus administration of intravenous contrast. RADIATION DOSE REDUCTION: This exam was performed according to the departmental  dose-optimization program which includes automated exposure control, adjustment of the mA and/or kV according to patient size and/or use of iterative reconstruction technique. CONTRAST:  166mL OMNIPAQUE IOHEXOL 300 MG/ML  SOLN COMPARISON:  01/31/2010 FINDINGS: Lower chest: No pleural or pericardial effusion. Small hiatal hernia. Hepatobiliary: No focal liver abnormality is seen. Status post cholecystectomy with ectatic CBD as before. No intrahepatic biliary dilatation. Pancreas: Unremarkable. No pancreatic ductal dilatation or surrounding inflammatory changes. Spleen: Upper limits normal in size, with scattered cysts in low-attenuation foci, nonspecific. Adrenals/Urinary Tract: No adrenal mass. No urolithiasis or hydronephrosis. Interval progression of multiple simple appearing renal cysts, largest 3.7 cm from the left upper pole, on the right 3.2 cm from the lower pole. A 1.7 cm 58 HU exophytic lesion from the mid left kidney, probably corresponding to a 1.1 cm lesion on the prior study, suggesting benign proteinaceous cyst given minimal increase over time. No specific follow-up is recommended. The urinary bladder is physiologically distended. Stomach/Bowel: Stomach is incompletely distended. Small hiatal hernia. Small bowel is decompressed. Appendix surgically absent. The colon is incompletely distended, with multiple descending and sigmoid diverticula, without significant adjacent inflammatory change. Vascular/Lymphatic: Moderate aortoiliac calcified atheromatous plaque without aneurysm or para stenosis. Portal vein patent. No abdominal or pelvic adenopathy localized. Reproductive: Status post hysterectomy. No adnexal masses. Other: No ascites.  No free air. Musculoskeletal: Interval orthopedic pinning across left femoral neck fracture. Solid-appearing L4-5 interbody fusion. Facet DJD L5-S1. IMPRESSION: 1. No acute findings. 2. Descending and sigmoid diverticulosis. 3. Small hiatal hernia. 4. Aortic  atherosclerosis (ICD10-I70.0). Electronically Signed   By: Lucrezia Europe M.D.   On: 05/15/2022 14:27   DG Chest Port 1 View  Result Date: 05/15/2022 CLINICAL DATA:  Chest pain EXAM: PORTABLE CHEST 1 VIEW COMPARISON:  07/28/2021 FINDINGS: Cardiomegaly and aortic tortuosity. Obscured right diaphragm primarily attributed to fat. Postoperative right lung with volume loss. There is no edema, consolidation, effusion, or pneumothorax. IMPRESSION: No acute finding. Electronically Signed   By: Jorje Guild M.D.   On: 05/15/2022 12:17    Procedures Procedures    Medications Ordered in ED Medications  pantoprazole (PROTONIX) injection 40 mg (40 mg Intravenous Given 05/17/22 1010)    ED Course/ Medical Decision Making/ A&P  Medical Decision Making Amount and/or Complexity of Data Reviewed Labs: ordered. Decision-making details documented in ED Course. Radiology: ordered and independent interpretation performed. Decision-making details documented in ED Course. ECG/medicine tests: ordered and independent interpretation performed. Decision-making details documented in ED Course.  Risk OTC drugs. Prescription drug management.   Patient presents to the ER with concerns of nausea, vomiting, diarrhea and abdominal pain.  Patient has been experiencing s/s x3-4 days.  She came to ER for same on 05/15/2022 but eloped.  Differential diagnoses include but are not limited to bowel obstruction, acute gastroenteritis, pancreatitis, peptic ulcer disease, acute hepatitis, acute cystitis, pyelonephritis.   Obtained additional information from EMR.  When patient presented to ER a couple days prior, abdominal pain waxes and wanes.  Patient states abdominal pain is now constant. Previous CT abdomen pelvis on 05/15/22 shows multiple descending and sigmoid colon diverticulosis, but no acute findings.  On physical examination, patient has tenderness to right upper quadrant and epigastric  region.  Hyperactive bowel sounds auscultated in RUQ.  She also has mild tenderness on palpation to the suprapubic region.  Laboratory work-up was ordered and personally reviewed.  Metabolic panel significant for mild hypokalemia at 3.2, elevated creatinine at 1.19, hypocalcemia 8.5. CBC demonstrates no leukocytosis or anemia.  Hgb 12.1.  ECG ordered and interpreted, demonstrates sinus rhythm with prolonged QT interval. Abdominal x-ray ordered, reviewed images. No acute findings or evidence of dilated bowel loops.  I do not feel that additional imaging is warranted at this time. Obtained fecal occult blood, chaperone present, resulted positive.  Non-melanotic stool on exam.  UA significant for cloudy appearance, leukocytes, protein, and many bacteria indicative of UTI.   Patient does not take daily NSAIDs, is not a chronic alcohol user, and has no history of GI bleed or PUD.  She has no active vomiting and she has non-melanotic stool.  She is hemodynamically stable, vital signs are stable.  Will have patient hold her daily aspirin.  Plan at this time is to discharge patient with close follow-up with PCP.  Will discharge patient on Keflex to treat UTI and daily Protonix.  Strict return precautions were given.   Discussed HPI, work-up, assessment and plan with attending Charlesetta Shanks.  The attending physician evaluated the patient independently as part of a shared visit and plans to discharge patient.         Final Clinical Impression(s) / ED Diagnoses Final diagnoses:  Acute cystitis without hematuria  Epigastric pain    Rx / DC Orders ED Discharge Orders          Ordered    pantoprazole (PROTONIX) 40 MG tablet  Daily before breakfast        05/17/22 1107    cephALEXin (KEFLEX) 500 MG capsule  4 times daily        05/17/22 1107              Theressa Stamps [place], [place] 05/17/22 1111    Charlesetta Shanks, MD 05/17/22 1551

## 2022-05-19 DIAGNOSIS — G8929 Other chronic pain: Secondary | ICD-10-CM | POA: Diagnosis not present

## 2022-05-19 DIAGNOSIS — M810 Age-related osteoporosis without current pathological fracture: Secondary | ICD-10-CM | POA: Diagnosis not present

## 2022-05-19 DIAGNOSIS — K219 Gastro-esophageal reflux disease without esophagitis: Secondary | ICD-10-CM | POA: Diagnosis not present

## 2022-05-19 DIAGNOSIS — J449 Chronic obstructive pulmonary disease, unspecified: Secondary | ICD-10-CM | POA: Diagnosis not present

## 2022-05-19 DIAGNOSIS — N1832 Chronic kidney disease, stage 3b: Secondary | ICD-10-CM | POA: Diagnosis not present

## 2022-05-19 DIAGNOSIS — E782 Mixed hyperlipidemia: Secondary | ICD-10-CM | POA: Diagnosis not present

## 2022-05-19 DIAGNOSIS — I10 Essential (primary) hypertension: Secondary | ICD-10-CM | POA: Diagnosis not present

## 2022-05-25 ENCOUNTER — Telehealth: Payer: Self-pay

## 2022-05-25 NOTE — Telephone Encounter (Signed)
        Patient  visited [facility] on 1024    Telephone encounter attempt :  1st  Unable to leave a [facility]  [phone] [address], [place], [place] [address] Phone: [phone] Email: [email or web address]

## 2022-06-06 ENCOUNTER — Emergency Department (HOSPITAL_COMMUNITY)
Admission: EM | Admit: 2022-06-06 | Discharge: 2022-06-06 | Discharge status: Against Medical Advice | Payer: Medicare Other | Attending: Emergency Medicine | Admitting: Emergency Medicine

## 2022-06-06 ENCOUNTER — Emergency Department (HOSPITAL_COMMUNITY): Payer: Medicare Other

## 2022-06-06 ENCOUNTER — Other Ambulatory Visit: Payer: Self-pay

## 2022-06-06 ENCOUNTER — Encounter (HOSPITAL_COMMUNITY): Payer: Self-pay | Admitting: Emergency Medicine

## 2022-06-06 DIAGNOSIS — M542 Cervicalgia: Secondary | ICD-10-CM | POA: Insufficient documentation

## 2022-06-06 DIAGNOSIS — M4802 Spinal stenosis, cervical region: Secondary | ICD-10-CM | POA: Diagnosis not present

## 2022-06-06 DIAGNOSIS — Z743 Need for continuous supervision: Secondary | ICD-10-CM | POA: Diagnosis not present

## 2022-06-06 DIAGNOSIS — Z5321 Procedure and treatment not carried out due to patient leaving prior to being seen by health care provider: Secondary | ICD-10-CM | POA: Insufficient documentation

## 2022-06-06 NOTE — ED Provider Triage Note (Signed)
Emergency Medicine Provider Triage Evaluation Note  [name] , a 81 y.o. female  was evaluated in triage.  Pt complains of right sided neck pain, started upon waking this morning.  Denies injury, fall, or trauma. BP elevated with EMS-- did not take her night time meds.  Review of Systems  Positive: Neck pain Negative: fever  Physical Exam  BP 105/88   Pulse 77   Temp 97.7 F (36.5 C) (Oral)   Resp 18   SpO2 95%   Gen:   Awake, no distress   Resp:  Normal effort  MSK:   Moves extremities without difficulty  Other:  Tenderness along right side of neck, no midline deformity, pain elicited with turning head, no rigidity, no meningismus  Medical Decision Making  Medically screening exam initiated at 3:20 AM.  Appropriate orders placed.  [name] was informed that the remainder of the evaluation will be completed by another provider, this initial triage assessment does not replace that evaluation, and the importance of remaining in the ED until their evaluation is complete.  Right sided neck pain.  Denies injury/trauma.  No deformity noted on exam, does have some localized tenderness.  Seems more muscular in origin but given her age, will check CT.  No headache, no meningeal signs, no rigidity.   Larene Pickett, PA-C 06/06/22 [phone]

## 2022-06-06 NOTE — ED Triage Notes (Addendum)
Patient arrived with EMS from home woke up with right upper back pain radiating to neck worse with movement / palpation , denies injury.

## 2022-06-06 NOTE — ED Notes (Signed)
Patient called x 3 no answer

## 2022-06-07 DIAGNOSIS — G8929 Other chronic pain: Secondary | ICD-10-CM | POA: Diagnosis not present

## 2022-06-07 DIAGNOSIS — K219 Gastro-esophageal reflux disease without esophagitis: Secondary | ICD-10-CM | POA: Diagnosis not present

## 2022-06-07 DIAGNOSIS — I5022 Chronic systolic (congestive) heart failure: Secondary | ICD-10-CM | POA: Diagnosis not present

## 2022-06-07 DIAGNOSIS — E782 Mixed hyperlipidemia: Secondary | ICD-10-CM | POA: Diagnosis not present

## 2022-06-07 DIAGNOSIS — M1990 Unspecified osteoarthritis, unspecified site: Secondary | ICD-10-CM | POA: Diagnosis not present

## 2022-06-07 DIAGNOSIS — J449 Chronic obstructive pulmonary disease, unspecified: Secondary | ICD-10-CM | POA: Diagnosis not present

## 2022-06-07 DIAGNOSIS — I10 Essential (primary) hypertension: Secondary | ICD-10-CM | POA: Diagnosis not present

## 2022-06-07 DIAGNOSIS — N1832 Chronic kidney disease, stage 3b: Secondary | ICD-10-CM | POA: Diagnosis not present

## 2022-06-08 DIAGNOSIS — I10 Essential (primary) hypertension: Secondary | ICD-10-CM | POA: Diagnosis not present

## 2022-06-08 DIAGNOSIS — M436 Torticollis: Secondary | ICD-10-CM | POA: Diagnosis not present

## 2022-06-29 DIAGNOSIS — E782 Mixed hyperlipidemia: Secondary | ICD-10-CM | POA: Diagnosis not present

## 2022-06-29 DIAGNOSIS — J449 Chronic obstructive pulmonary disease, unspecified: Secondary | ICD-10-CM | POA: Diagnosis not present

## 2022-06-29 DIAGNOSIS — M810 Age-related osteoporosis without current pathological fracture: Secondary | ICD-10-CM | POA: Diagnosis not present

## 2022-06-29 DIAGNOSIS — N1832 Chronic kidney disease, stage 3b: Secondary | ICD-10-CM | POA: Diagnosis not present

## 2022-06-29 DIAGNOSIS — I10 Essential (primary) hypertension: Secondary | ICD-10-CM | POA: Diagnosis not present

## 2022-06-29 DIAGNOSIS — M1990 Unspecified osteoarthritis, unspecified site: Secondary | ICD-10-CM | POA: Diagnosis not present

## 2022-06-29 DIAGNOSIS — G8929 Other chronic pain: Secondary | ICD-10-CM | POA: Diagnosis not present

## 2022-06-29 DIAGNOSIS — K219 Gastro-esophageal reflux disease without esophagitis: Secondary | ICD-10-CM | POA: Diagnosis not present

## 2022-08-26 DIAGNOSIS — I10 Essential (primary) hypertension: Secondary | ICD-10-CM | POA: Diagnosis not present

## 2022-08-26 DIAGNOSIS — K219 Gastro-esophageal reflux disease without esophagitis: Secondary | ICD-10-CM | POA: Diagnosis not present

## 2022-08-26 DIAGNOSIS — E782 Mixed hyperlipidemia: Secondary | ICD-10-CM | POA: Diagnosis not present

## 2022-08-26 DIAGNOSIS — G8929 Other chronic pain: Secondary | ICD-10-CM | POA: Diagnosis not present

## 2022-08-26 DIAGNOSIS — N1832 Chronic kidney disease, stage 3b: Secondary | ICD-10-CM | POA: Diagnosis not present

## 2022-08-26 DIAGNOSIS — I5022 Chronic systolic (congestive) heart failure: Secondary | ICD-10-CM | POA: Diagnosis not present

## 2022-08-26 DIAGNOSIS — J449 Chronic obstructive pulmonary disease, unspecified: Secondary | ICD-10-CM | POA: Diagnosis not present

## 2022-09-27 DIAGNOSIS — J449 Chronic obstructive pulmonary disease, unspecified: Secondary | ICD-10-CM | POA: Diagnosis not present

## 2022-09-27 DIAGNOSIS — G8929 Other chronic pain: Secondary | ICD-10-CM | POA: Diagnosis not present

## 2022-09-27 DIAGNOSIS — I10 Essential (primary) hypertension: Secondary | ICD-10-CM | POA: Diagnosis not present

## 2022-09-27 DIAGNOSIS — E782 Mixed hyperlipidemia: Secondary | ICD-10-CM | POA: Diagnosis not present

## 2023-01-10 ENCOUNTER — Telehealth: Payer: Self-pay | Admitting: Internal Medicine

## 2023-01-10 NOTE — Telephone Encounter (Signed)
Patient niece [name] called in and wanted to know if Dr. Para March would take her on as a patient. She stated that she sees Dr. Para March and would love for her aunt to see him. Please advise. Thank you!

## 2023-01-12 NOTE — Telephone Encounter (Signed)
I reached my cap again for new patients.  I am not happy to decline but I can't take new patients on right now.

## 2023-01-12 NOTE — Telephone Encounter (Signed)
Called and advised patient niece, scheduled patient new patient appt with Audria Nine 04/05/23

## 2023-02-20 ENCOUNTER — Ambulatory Visit (INDEPENDENT_AMBULATORY_CARE_PROVIDER_SITE_OTHER): Payer: Medicare HMO | Admitting: Nurse Practitioner

## 2023-02-20 ENCOUNTER — Encounter: Payer: Self-pay | Admitting: Nurse Practitioner

## 2023-02-20 VITALS — BP 118/72 | HR 78 | Temp 98.0°F | Ht 62.0 in | Wt 139.0 lb

## 2023-02-20 DIAGNOSIS — R350 Frequency of micturition: Secondary | ICD-10-CM | POA: Diagnosis not present

## 2023-02-20 DIAGNOSIS — N3090 Cystitis, unspecified without hematuria: Secondary | ICD-10-CM | POA: Diagnosis not present

## 2023-02-20 DIAGNOSIS — N3944 Nocturnal enuresis: Secondary | ICD-10-CM | POA: Diagnosis not present

## 2023-02-20 LAB — POC URINALSYSI DIPSTICK (AUTOMATED)
Bilirubin, UA: NEGATIVE
Blood, UA: NEGATIVE
Glucose, UA: NEGATIVE
Ketones, UA: NEGATIVE
Nitrite, UA: POSITIVE
Protein, UA: POSITIVE — AB
Spec Grav, UA: 1.02 (ref 1.010–1.025)
Urobilinogen, UA: 0.2 E.U./dL
pH, UA: 6 (ref 5.0–8.0)

## 2023-02-20 MED ORDER — CEPHALEXIN 500 MG PO CAPS: 500.0000 mg | ORAL_CAPSULE | Freq: Two times a day (BID) | ORAL | 0 refills | Status: AC

## 2023-02-20 NOTE — Patient Instructions (Signed)
Nice to see you today I will be in touch with the urine culture results once I have them  Follow up as scheduled. Let me know if you need me before

## 2023-02-20 NOTE — Assessment & Plan Note (Signed)
Given UA of urinary incontinence will treat for cystitis.  Keflex 500 mg twice daily for a week.  Pending urine culture

## 2023-02-20 NOTE — Assessment & Plan Note (Signed)
Given positive cystitis today treat with Keflex 500 mg twice daily for 7 days.  Patient still has recurrence of symptoms consider overactive bladder medication to help.  Pending renal function for that reason today

## 2023-02-20 NOTE — Progress Notes (Signed)
Acute Office Visit  Subjective:     Patient ID: [name], female    DOB: 06-25-1941, 82 y.o.   MRN: [id number]  Chief Complaint  Patient presents with   bladder issues    Pt states of frequent urination. No burning or pain. Pees more at night. Pt daughter states that she pees on herself often at night.     HPI Patient is in today for urinary issues. She is a new patient to the practice   States that it has been happening for the past 3 years. States that she does not have trouble during the day time. State worse at night. States that she does pee on herself . State that she does have an odor to her urine. States that she does have back pain but she relates that to a fall several weeks ago. She also has nausea but it did not start the same time that the urinary complaints have happened   Review of Systems  Constitutional:  Negative for chills and fever.  Respiratory:  Negative for shortness of breath.   Cardiovascular:  Negative for chest pain.  Gastrointestinal:  Positive for nausea. Negative for abdominal pain, constipation, diarrhea and vomiting.  Genitourinary:  Negative for dysuria and hematuria.  Musculoskeletal:  Positive for back pain. Negative for joint pain.        Objective:    BP 118/72   Pulse 78   Temp 98 F (36.7 C) (Temporal)   Ht 5\' 2"  (1.575 m)   Wt 139 lb (63 kg)   SpO2 94%   BMI 25.42 kg/m    Physical Exam Vitals and nursing note reviewed.  Constitutional:      Appearance: Normal appearance.  Cardiovascular:     Rate and Rhythm: Normal rate and regular rhythm.     Heart sounds: Normal heart sounds.  Pulmonary:     Effort: Pulmonary effort is normal.     Breath sounds: Normal breath sounds.  Abdominal:     General: Bowel sounds are normal. There is no distension.     Palpations: There is no mass.     Tenderness: There is abdominal tenderness. There is right CVA tenderness and left CVA tenderness.     Hernia: No hernia is present.   Neurological:     Mental Status: She is alert.     Results for orders placed or performed in visit on 02/20/23  POCT Urinalysis Dipstick (Automated)  Result Value Ref Range   Color, UA dark yellow    Clarity, UA cloudy    Glucose, UA Negative Negative   Bilirubin, UA neg    Ketones, UA neg    Spec Grav, UA 1.020 1.010 - 1.025   Blood, UA neg    pH, UA 6.0 5.0 - 8.0   Protein, UA Positive (A) Negative   Urobilinogen, UA 0.2 0.2 or 1.0 E.U./dL   Nitrite, UA positive    Leukocytes, UA Small (1+) (A) Negative        Assessment & Plan:   Problem List Items Addressed This Visit       Genitourinary   Cystitis    Given UA of urinary incontinence will treat for cystitis.  Keflex 500 mg twice daily for a week.  Pending urine culture      Relevant Medications   cephALEXin (KEFLEX) 500 MG capsule   Other Relevant Orders   Basic metabolic panel   Urine Culture     Other   Urinary frequency -  Primary    UA in office      Relevant Orders   POCT Urinalysis Dipstick (Automated) (Completed)   Nocturnal enuresis    Given positive cystitis today treat with Keflex 500 mg twice daily for 7 days.  Patient still has recurrence of symptoms consider overactive bladder medication to help.  Pending renal function for that reason today       Meds ordered this encounter  Medications   cephALEXin (KEFLEX) 500 MG capsule    Sig: Take 1 capsule (500 mg total) by mouth 2 (two) times daily for 7 days.    Dispense:  14 capsule    Refill:  0    Order Specific Question:   Supervising Provider    Answer:   Roxy Manns A [[id number]]    Return if symptoms worsen or fail to improve, for As scheduled .  Audria Nine, NP

## 2023-02-20 NOTE — Assessment & Plan Note (Signed)
UA in office 

## 2023-03-02 ENCOUNTER — Telehealth: Payer: Self-pay

## 2023-03-02 NOTE — Telephone Encounter (Signed)
LMOM to CB in regards to urine culture

## 2023-04-05 ENCOUNTER — Ambulatory Visit (INDEPENDENT_AMBULATORY_CARE_PROVIDER_SITE_OTHER): Payer: 59 | Admitting: Nurse Practitioner

## 2023-04-05 ENCOUNTER — Encounter: Payer: Self-pay | Admitting: Nurse Practitioner

## 2023-04-05 VITALS — BP 116/72 | HR 94 | Temp 97.7°F | Ht 60.0 in | Wt 135.4 lb

## 2023-04-05 DIAGNOSIS — R42 Dizziness and giddiness: Secondary | ICD-10-CM | POA: Diagnosis not present

## 2023-04-05 DIAGNOSIS — Z23 Encounter for immunization: Secondary | ICD-10-CM

## 2023-04-05 DIAGNOSIS — I10 Essential (primary) hypertension: Secondary | ICD-10-CM | POA: Diagnosis not present

## 2023-04-05 DIAGNOSIS — Z9981 Dependence on supplemental oxygen: Secondary | ICD-10-CM | POA: Diagnosis not present

## 2023-04-05 DIAGNOSIS — E785 Hyperlipidemia, unspecified: Secondary | ICD-10-CM

## 2023-04-05 DIAGNOSIS — J441 Chronic obstructive pulmonary disease with (acute) exacerbation: Secondary | ICD-10-CM

## 2023-04-05 DIAGNOSIS — C3491 Malignant neoplasm of unspecified part of right bronchus or lung: Secondary | ICD-10-CM | POA: Diagnosis not present

## 2023-04-05 LAB — CBC
HCT: 36.6 % (ref 36.0–46.0)
Hemoglobin: 11.5 g/dL — ABNORMAL LOW (ref 12.0–15.0)
MCHC: 31.4 g/dL (ref 30.0–36.0)
MCV: 81.8 fl (ref 78.0–100.0)
Platelets: 475 10*3/uL — ABNORMAL HIGH (ref 150.0–400.0)
RBC: 4.47 Mil/uL (ref 3.87–5.11)
RDW: 16.8 % — ABNORMAL HIGH (ref 11.5–15.5)
WBC: 11.4 10*3/uL — ABNORMAL HIGH (ref 4.0–10.5)

## 2023-04-05 LAB — LIPID PANEL
Cholesterol: 160 mg/dL (ref 0–200)
HDL: 67.7 mg/dL (ref >39.00)
LDL Cholesterol: 66 mg/dL (ref 0–99)
NonHDL: 92.31
Total CHOL/HDL Ratio: 2
Triglycerides: 132 mg/dL (ref 0.0–149.0)
VLDL: 26.4 mg/dL (ref 0.0–40.0)

## 2023-04-05 LAB — TSH: TSH: 1.12 u[IU]/mL (ref 0.35–5.50)

## 2023-04-05 MED ORDER — DOXYCYCLINE HYCLATE 100 MG PO TABS
100.0000 mg | ORAL_TABLET | Freq: Two times a day (BID) | ORAL | 0 refills | Status: DC
Start: 2023-04-05 — End: 2023-04-05

## 2023-04-05 MED ORDER — ALBUTEROL SULFATE HFA 108 (90 BASE) MCG/ACT IN AERS: 2.0000 | INHALATION_SPRAY | Freq: Four times a day (QID) | RESPIRATORY_TRACT | 1 refills | Status: AC | PRN

## 2023-04-05 MED ORDER — TRELEGY ELLIPTA 100-62.5-25 MCG/ACT IN AEPB
1.0000 | INHALATION_SPRAY | Freq: Every morning | RESPIRATORY_TRACT | 11 refills | Status: DC
Start: 2023-04-05 — End: 2024-04-24

## 2023-04-05 NOTE — Assessment & Plan Note (Signed)
History of same refill Trelegy and albuterol inhaler.  Patient having exacerbation today we will treat with doxycycline

## 2023-04-05 NOTE — Assessment & Plan Note (Signed)
Patient currently on atorvastatin pending lipid panel today

## 2023-04-05 NOTE — Assessment & Plan Note (Signed)
History of same.  Ambulatory referral to pulmonology

## 2023-04-05 NOTE — Patient Instructions (Addendum)
Nice to see you today We are going to STOP the amlodipine I have sent in the inhalers to the pharmacy along with an antibiotic I want to see you in 1 month, sooner if you need me

## 2023-04-05 NOTE — Progress Notes (Signed)
New Patient Office Visit  Subjective    Patient ID: [name], female    DOB: 1941-05-26  Age: 82 y.o. MRN: [id number]  CC:  Chief Complaint  Patient presents with   Establish Care    Pt would like prevnar 20 shot. Pt complains of having oxygen tank but states it does not work and she needs a new one.    possible covid    Pt states she believes she has COVID. Cough/congestion for 3 weeks.    Medication Problem    Pt complains of need for Ventolin HFA inhaler and Ellipta inhaler. Pt states she cannot get them filled anymore due to upstream     HPI [name] presents to establish care  HTN: amlodipine, lisinopril, furesomide. Checks blood pressure once a week at home. States that she gets about the same but I having dizziness.  States that she has not been taking the blood pressure medicaoitns   COPD/ lung Cancer: treglegy and albuterol.  States that she does the albuterol daily. States that she has a history of oxygen in the past. States that the machine is not working. Statea that she would wear it at home and when she is doing things  GAD: sertraline 100mg . Patient tolerates the medication well. States that she feels like her mood is ok   HLD: atorvastatin. No problems with medication  OAB: oxybutynin. Patient states that she is still having frequent urination and nocturia    Dizziness: states that she has been experiencing dizziness. It is both room spinning and a light headed sensation. It is several times through out the day. Can be with or without positions changes.   Cough: for 2-3 weeks and has been having increased shortness of breath and an increase in mucous. Covid test    Tdap: get at local phamracy Flu: update today Covid Pna: last year Shingles   Dexa: needs [place]: does not want to purse [place]: aged out Pap smear: aged out  Advanced Directive: states that she has talked to her daughter (who is in office) about it but not official or  legal paperwork. She also has a son Outpatient Encounter Medications as of 04/05/2023  Medication Sig   acetaminophen (TYLENOL) 500 MG tablet Take 2 tablets (1,000 mg total) by mouth every 6 (six) hours. (Patient taking differently: Take 500 mg by mouth daily as needed (back pain).)   aspirin EC 325 MG tablet Take 1 tablet (325 mg total) by mouth 2 (two) times daily.   atorvastatin (LIPITOR) 20 MG tablet Take 20 mg by mouth at bedtime.   Calcium Carb-Cholecalciferol 600-10 MG-MCG TABS Take by mouth.   furosemide (LASIX) 20 MG tablet Take 20 mg by mouth every Monday, Wednesday, and Friday.   gabapentin (NEURONTIN) 400 MG capsule Take 400 mg by mouth 3 (three) times daily.   Glucosamine HCl 1500 MG TABS Take by mouth.   lisinopril (ZESTRIL) 10 MG tablet Take 10 mg by mouth every morning.   metoCLOPramide (REGLAN) 5 MG tablet Take 5 mg by mouth 2 (two) times daily.   mirtazapine (REMERON) 7.5 MG tablet Take 7.5 mg by mouth at bedtime.   oxybutynin (DITROPAN-XL) 5 MG 24 hr tablet Take 5 mg by mouth at bedtime.   raloxifene (EVISTA) 60 MG tablet Take 60 mg by mouth every morning.   sertraline (ZOLOFT) 100 MG tablet Take 100 mg by mouth in the morning and at bedtime.    tiZANidine (ZANAFLEX) 2 MG tablet Take 2  mg by mouth 3 (three) times daily as needed.   [DISCONTINUED] albuterol (PROVENTIL HFA;VENTOLIN HFA) 108 (90 BASE) MCG/ACT inhaler Inhale 2 puffs into the lungs every 6 (six) hours as needed. (Patient taking differently: Inhale 2 puffs into the lungs every 4 (four) hours as needed for wheezing or shortness of breath.)   [DISCONTINUED] aluminum-magnesium hydroxide-simethicone (MAALOX) 200-200-20 MG/5ML SUSP Take 30 mLs by mouth 3 (three) times daily as needed (indigestion).    [DISCONTINUED] amLODipine (NORVASC) 5 MG tablet Take 5 mg by mouth every morning.   [DISCONTINUED] diazepam (VALIUM) 5 MG tablet Take 0.5 tablets (2.5 mg total) by mouth every 8 (eight) hours as needed for anxiety. Must last  30 days.   [DISCONTINUED] doxycycline (VIBRA-TABS) 100 MG tablet Take 1 tablet (100 mg total) by mouth 2 (two) times daily for 7 days.   [DISCONTINUED] ferrous sulfate 325 (65 FE) MG tablet Take by mouth.   [DISCONTINUED] Fluticasone-Umeclidin-Vilant (TRELEGY ELLIPTA) 100-62.5-25 MCG/ACT AEPB Inhale 1 puff into the lungs every morning.   [DISCONTINUED] meclizine (ANTIVERT) 25 MG tablet Take 25 mg by mouth 3 (three) times daily as needed.   [DISCONTINUED] meloxicam (MOBIC) 7.5 MG tablet Take 7.5-15 mg by mouth daily as needed for pain.   [DISCONTINUED] metoprolol tartrate (LOPRESSOR) 25 MG tablet Take by mouth.   [DISCONTINUED] oxyCODONE (OXY IR/ROXICODONE) 5 MG immediate release tablet TAKE 1 TO 2 TABLETS BY MOUTH EVERY 6 HOURS AS NEEDED FOR moderate pain (pain score 4-6)   [DISCONTINUED] traMADol (ULTRAM) 50 MG tablet Take 1 tablet (50 mg total) by mouth 3 (three) times daily as needed for moderate pain.   albuterol (VENTOLIN HFA) 108 (90 Base) MCG/ACT inhaler Inhale 2 puffs into the lungs every 6 (six) hours as needed.   Fluticasone-Umeclidin-Vilant (TRELEGY ELLIPTA) 100-62.5-25 MCG/ACT AEPB Inhale 1 puff into the lungs every morning.   [DISCONTINUED] pantoprazole (PROTONIX) 40 MG tablet Take 1 tablet (40 mg total) by mouth daily before breakfast.   No facility-administered encounter medications on file as of 04/05/2023.    Past Medical History:  Diagnosis Date   Acute metabolic encephalopathy 06/02/2019   Adenocarcinoma of right lung, stage 1 (HCC) 07/28/2016   Anxiety    Cancer (HCC)    uterine   Closed fracture of left distal radius 06/13/2020   COPD (chronic obstructive pulmonary disease) (HCC)    Coronary artery disease    mild nonobstructive by 2019 cath   Depression    GERD (gastroesophageal reflux disease)    Hyperlipidemia    Hypertension    Low back pain    Osteoarthritis    Osteoporosis    Takotsubo cardiomyopathy    EF recovered to 50-55% by 01/23/18 echo   Vitamin D  insufficiency 06/13/2020    Past Surgical History:  Procedure Laterality Date   ANKLE SURGERY Right    horse accident   APPENDECTOMY     BACK SURGERY     CHOLECYSTECTOMY     EYE SURGERY     lense implant   FRACTURE SURGERY     HIP SURGERY  01/2010   Left Hip    INNER EAR SURGERY Bilateral 1970   related to severe ear infections   KNEE SURGERY     Right knee   LEFT HEART CATH AND CORONARY ANGIOGRAPHY N/A 12/11/2017   Procedure: LEFT HEART CATH AND CORONARY ANGIOGRAPHY;  Surgeon: Corky Crafts, MD;  Location: [facility];  Service: Cardiovascular;  Laterality: N/A;   LOBECTOMY Right 05/09/2016   Procedure: RIGHT UPPER  LOBECTOMY;  Surgeon: Loreli Slot, MD;  Location: [facility] OR;  Service: Thoracic;  Laterality: Right;   MASS EXCISION  08/25/2011   Procedure: EXCISION MASS;  Surgeon: Shelly Rubenstein, MD;  Location: [facility];  Service: General;  Laterality: N/A;  excision left neck mass   OPEN REDUCTION INTERNAL FIXATION (ORIF) DISTAL RADIAL FRACTURE Left 06/12/2020   Procedure: OPEN REDUCTION INTERNAL FIXATION (ORIF) DISTAL RADIAL FRACTURE;  Surgeon: Roby Lofts, MD;  Location: [facility] OR;  Service: Orthopedics;  Laterality: Left;   TOTAL ABDOMINAL HYSTERECTOMY     TOTAL KNEE ARTHROPLASTY Right 05/27/2019   Procedure: TOTAL KNEE ARTHROPLASTY;  Surgeon: Dannielle Huh, MD;  Location: [facility];  Service: Orthopedics;  Laterality: Right;  75 mins needed for length of case   TOTAL KNEE ARTHROPLASTY Left 12/30/2019   Procedure: TOTAL KNEE ARTHROPLASTY;  Surgeon: Dannielle Huh, MD;  Location: [facility];  Service: Orthopedics;  Laterality: Left;   VIDEO ASSISTED THORACOSCOPY (VATS)/WEDGE RESECTION Right 05/09/2016   Procedure: VIDEO ASSISTED THORACOSCOPY (VATS)/WEDGE RESECTION;  Surgeon: Loreli Slot, MD;  Location: [facility] OR;  Service: Thoracic;  Laterality: Right;    Family History  Problem Relation Age of Onset   Heart disease Mother    Heart disease Father     Cancer Sister        #1, breast   Heart disease Brother        had CABG    Social History   Socioeconomic History   Marital status: Widowed    Spouse name: Not on file   Number of children: Not on file   Years of education: Not on file   Highest education level: Not on file  Occupational History   Not on file  Tobacco Use   Smoking status: Former    Current packs/day: 0.00    Average packs/day: 2.0 packs/day for 51.0 years (102.0 ttl pk-yrs)    Types: Cigarettes    Start date: 05/04/1961    Quit date: 05/04/2012    Years since quitting: 10.9   Smokeless tobacco: Never  Vaping Use   Vaping status: Former   Quit date: 12/01/2017  Substance and Sexual Activity   Alcohol use: No    Alcohol/week: 0.0 standard drinks of alcohol   Drug use: No   Sexual activity: Yes    Birth control/protection: Other-see comments  Other Topics Concern   Not on file  Social History Narrative   Single, lives alone with 2 dogs   3 children   Retired: several careers included Neurosurgeon, diners, truck stops   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  Constitutional:      Appearance: Normal appearance.  HENT:     Right Ear: Tympanic membrane, ear canal and external ear normal.     Left Ear: Tympanic membrane, ear canal and external ear normal.  Mouth/Throat:     Mouth: Mucous membranes are moist.     Pharynx: Oropharynx is clear.  Eyes:     Extraocular Movements: Extraocular movements intact.      Pupils: Pupils are equal, round, and reactive to light.  Cardiovascular:     Rate and Rhythm: Normal rate and regular rhythm.     Pulses: Normal pulses.     Heart sounds: Normal heart sounds.  Pulmonary:     Effort: Pulmonary effort is normal.     Breath sounds: Normal breath sounds.  Musculoskeletal:     Right lower leg: No edema.     Left lower leg: No edema.  Lymphadenopathy:     Cervical: No cervical adenopathy.  Skin:    General: Skin is warm.  Neurological:     General: No focal deficit present.     Mental Status: She is alert.     Deep Tendon Reflexes:     Reflex Scores:      Bicep reflexes are 2+ on the right side and 2+ on the left side.      Patellar reflexes are 2+ on the right side and 2+ on the left side.    Comments: Bilateral upper and lower extremity strength 5/5  Psychiatric:        Mood and Affect: Mood normal.        Behavior: Behavior normal.        Thought Content: Thought content normal.        Judgment: Judgment normal.         Assessment & Plan:   Problem List Items Addressed This Visit       Cardiovascular and Mediastinum   Essential hypertension - Primary    Patient was on amlodipine 5 mg, lisinopril, furosemide.  Will discontinue amlodipine continue lisinopril and furosemide.  See if this aids in patient's dizziness follow-up 1 month      Relevant Orders   CBC   TSH     Respiratory   COPD (chronic obstructive pulmonary disease) (HCC)    History of same refill Trelegy and albuterol inhaler.  Patient having exacerbation today we will treat with doxycycline       Relevant Medications   Fluticasone-Umeclidin-Vilant (TRELEGY ELLIPTA) 100-62.5-25 MCG/ACT AEPB   albuterol (VENTOLIN HFA) 108 (90 Base) MCG/ACT inhaler   Other Relevant Orders   Ambulatory referral to Pulmonology   Adenocarcinoma of right lung, stage 1 [facility])    Patient states she is no longer followed by pulmonology or oncology.  History of a lobectomy        Other    Hyperlipidemia    Patient currently on atorvastatin pending lipid panel today.      Relevant Orders   Lipid panel   Dizziness    Likely from polypharmacy.  Will discontinue amlodipine 5 mg.  If this is beneficial continue.  If it does resolve after discontinuation of amlodipine consider increasing oxybutynin for OAB.      History of home oxygen therapy    History of same.  Ambulatory referral to pulmonology      Other Visit Diagnoses     Need for influenza vaccination       Relevant Orders   Flu Vaccine Trivalent High Dose (Fluad) (Completed)       Return in about 4 weeks (around 05/03/2023) for lightheadedness/dizziness.   Audria Nine, NP

## 2023-04-05 NOTE — Assessment & Plan Note (Signed)
Patient states she is no longer followed by pulmonology or oncology.  History of a lobectomy

## 2023-04-05 NOTE — Assessment & Plan Note (Signed)
Likely from polypharmacy.  Will discontinue amlodipine 5 mg.  If this is beneficial continue.  If it does resolve after discontinuation of amlodipine consider increasing oxybutynin for OAB.

## 2023-04-05 NOTE — Assessment & Plan Note (Signed)
Patient was on amlodipine 5 mg, lisinopril, furosemide.  Will discontinue amlodipine continue lisinopril and furosemide.  See if this aids in patient's dizziness follow-up 1 month

## 2023-05-04 ENCOUNTER — Ambulatory Visit: Payer: 59 | Admitting: Nurse Practitioner

## 2023-05-04 ENCOUNTER — Ambulatory Visit: Payer: 59 | Attending: Nurse Practitioner

## 2023-05-04 ENCOUNTER — Ambulatory Visit (INDEPENDENT_AMBULATORY_CARE_PROVIDER_SITE_OTHER)
Admission: RE | Admit: 2023-05-04 | Discharge: 2023-05-04 | Disposition: A | Discharge status: Home / Self Care | Payer: 59 | Source: Ambulatory Visit | Attending: Nurse Practitioner | Admitting: Nurse Practitioner

## 2023-05-04 VITALS — BP 128/82 | HR 82 | Ht 60.0 in | Wt 139.0 lb

## 2023-05-04 DIAGNOSIS — R2232 Localized swelling, mass and lump, left upper limb: Secondary | ICD-10-CM

## 2023-05-04 DIAGNOSIS — K219 Gastro-esophageal reflux disease without esophagitis: Secondary | ICD-10-CM | POA: Diagnosis not present

## 2023-05-04 DIAGNOSIS — R42 Dizziness and giddiness: Secondary | ICD-10-CM

## 2023-05-04 DIAGNOSIS — R296 Repeated falls: Secondary | ICD-10-CM

## 2023-05-04 DIAGNOSIS — M84439K Pathological fracture, unspecified ulna and radius, subsequent encounter for fracture with nonunion: Secondary | ICD-10-CM | POA: Diagnosis not present

## 2023-05-04 DIAGNOSIS — R269 Unspecified abnormalities of gait and mobility: Secondary | ICD-10-CM | POA: Diagnosis not present

## 2023-05-04 DIAGNOSIS — J440 Chronic obstructive pulmonary disease with acute lower respiratory infection: Secondary | ICD-10-CM

## 2023-05-04 DIAGNOSIS — M1812 Unilateral primary osteoarthritis of first carpometacarpal joint, left hand: Secondary | ICD-10-CM | POA: Diagnosis not present

## 2023-05-04 MED ORDER — MECLIZINE HCL 12.5 MG PO TABS: 12.5000 mg | ORAL_TABLET | Freq: Three times a day (TID) | ORAL | 0 refills | Status: DC | PRN

## 2023-05-04 MED ORDER — FAMOTIDINE 20 MG PO TABS
20.0000 mg | ORAL_TABLET | Freq: Every day | ORAL | 0 refills | Status: DC
Start: 2023-05-04 — End: 2023-08-28

## 2023-05-04 NOTE — Assessment & Plan Note (Signed)
History of the same patient states she has been dependent on oxygen in the past and needs more for home.  O2 sats in office have been within normal limits ambulatory referral to pulmonology for further evaluation and treatment

## 2023-05-04 NOTE — Patient Instructions (Signed)
Nice to see you today I will be in touch with the xray once I have the results See me in 6 weeks for a recheck, sooner If you need me

## 2023-05-04 NOTE — Progress Notes (Signed)
Established Patient Office Visit  Subjective   Patient ID: [name], female    DOB: 05/26/1941  Age: 82 y.o. MRN: [id number]  Chief Complaint  Patient presents with   Hypertension    Here with daughter. Does not check blood pressure at home.    Portable Oxygen    Oxygen company states she needs a new order and remove the one that is broken.       HTN/Dizziness: Patient was seen by me on 04/05/2019 for with complaints of dizziness.  Patient was on several antihypertensives so we discontinued amlodipine 5 mg.  Patient is here for recheck. States that she is having dizziness and lightheadedness. States that she will have it.  It can be at rest or with position changes.  Patient also mentions at times it feels like her heart is pounding will happen for 20 to 30 minutes and resolved.  She states "a nurse told her to cross her arms and call" to help with her heart patient states that does help with the rhythm  Altered gait/falls: Patient states she does not go anywhere anymore as she is scared of falling.  She will ambulate on her front porch.  States she does have a cane and a walker that she will use sometimes but she i fallen with the walker.  Oxygen: Patient is mentioning that she has a home oxygen that she uses sometimes.  States that she also checks her pulse ox at home.  Has not checked it as of late.  Does not have oxygen use at home as of late.  O2 sats in office device been good.  She does have a history of COPD.    Review of Systems  Constitutional:  Negative for chills and fever.  Respiratory:  Negative for shortness of breath.   Cardiovascular:  Negative for chest pain.  Neurological:  Positive for dizziness. Negative for headaches.      Objective:     BP 128/82   Pulse 82   Ht 5' (1.524 m)   Wt 139 lb (63 kg)   SpO2 96%   BMI 27.15 kg/m    Physical Exam Vitals and nursing note reviewed.  Constitutional:      Appearance: Normal appearance.   Cardiovascular:     Rate and Rhythm: Normal rate and regular rhythm.     Heart sounds: Normal heart sounds.  Pulmonary:     Effort: Pulmonary effort is normal.     Breath sounds: Normal breath sounds.  Musculoskeletal:        General: Swelling present.       Arms:  Neurological:     Mental Status: She is alert.      No results found for any visits on 05/04/23.    The ASCVD Risk score (Arnett DK, et al., 2019) failed to calculate for the following reasons:   The 2019 ASCVD risk score is only valid for ages 48 to 16   The patient has a prior MI or stroke diagnosis    Assessment & Plan:   Problem List Items Addressed This Visit       Respiratory   COPD (chronic obstructive pulmonary disease) (HCC)    History of the same patient states she has been dependent on oxygen in the past and needs more for home.  O2 sats in office have been within normal limits ambulatory referral to pulmonology for further evaluation and treatment      Relevant Orders   Ambulatory referral  to Pulmonology     Digestive   GERD (gastroesophageal reflux disease)    States she is having breakthrough heartburn.  Her daughter has been giving her omeprazole and seems to be helpful.  Will start patient on famotidine 20 mg nightly      Relevant Medications   meclizine (ANTIVERT) 12.5 MG tablet   famotidine (PEPCID) 20 MG tablet     Other   Dizziness - Primary    Did discontinue one of her blood pressure medications with great improvement.  Will try meclizine 12.5 mg 3 times daily as needed      Relevant Medications   meclizine (ANTIVERT) 12.5 MG tablet   Other Relevant Orders   LONG TERM MONITOR (3-14 DAYS)   Gait abnormality    Patient to continue using walking aids ambulatory referral for home health PT.      Relevant Orders   Ambulatory referral to Home Health   Frequent falls    Continue using walking aids and ambulatory referral to home health.      Relevant Orders   Ambulatory  referral to Home Health   Mass of left wrist    Pending x-ray of wrist likely need ultrasound of soft tissue      Relevant Orders   DG Wrist 2 Views Left    Return in about 6 weeks (around 06/15/2023) for BP recheck, Dizziness, Falls .    Audria Nine, NP

## 2023-05-04 NOTE — Assessment & Plan Note (Signed)
Pending x-ray of wrist likely need ultrasound of soft tissue

## 2023-05-04 NOTE — Assessment & Plan Note (Signed)
Continue using walking aids and ambulatory referral to home health.

## 2023-05-04 NOTE — Assessment & Plan Note (Signed)
States she is having breakthrough heartburn.  Her daughter has been giving her omeprazole and seems to be helpful.  Will start patient on famotidine 20 mg nightly

## 2023-05-04 NOTE — Assessment & Plan Note (Signed)
Did discontinue one of her blood pressure medications with great improvement.  Will try meclizine 12.5 mg 3 times daily as needed

## 2023-05-04 NOTE — Assessment & Plan Note (Signed)
Patient to continue using walking aids ambulatory referral for home health PT.

## 2023-05-05 ENCOUNTER — Encounter: Payer: Self-pay | Admitting: Pulmonary Disease

## 2023-05-09 ENCOUNTER — Telehealth: Payer: Self-pay | Admitting: Nurse Practitioner

## 2023-05-09 NOTE — Telephone Encounter (Signed)
Noted  

## 2023-05-09 NOTE — Telephone Encounter (Signed)
-----   Message from [facility] T sent at 05/08/2023  4:45 PM EDT ----- Called patient reviewed all information and repeated back to me. Does not want to see ortho. Pt has no other concern or questions.

## 2023-05-12 ENCOUNTER — Telehealth: Payer: Self-pay

## 2023-05-12 NOTE — Telephone Encounter (Signed)
Contacted and spoke with pt in regards to calling [facility] agency about scheduling a time for PT.   Stated that we have been attempting to reach her. Pt was unaware of agency name and phone number. Informed pt with phone number and which agency it is that will be conducting her PT.    Pt verbalized understanding and repeated phone number back to me. No questions or concerns.

## 2023-06-14 ENCOUNTER — Institutional Professional Consult (permissible substitution): Payer: 59 | Admitting: Pulmonary Disease

## 2023-06-15 ENCOUNTER — Ambulatory Visit: Payer: 59 | Admitting: Nurse Practitioner

## 2023-06-16 ENCOUNTER — Encounter: Payer: Self-pay | Admitting: Nurse Practitioner

## 2023-07-03 ENCOUNTER — Ambulatory Visit: Payer: 59 | Admitting: Nurse Practitioner

## 2023-07-04 ENCOUNTER — Encounter: Payer: Self-pay | Admitting: Nurse Practitioner

## 2023-07-10 ENCOUNTER — Institutional Professional Consult (permissible substitution): Payer: 59 | Admitting: Pulmonary Disease

## 2023-07-13 ENCOUNTER — Institutional Professional Consult (permissible substitution): Payer: 59 | Admitting: Pulmonary Disease

## 2023-08-02 ENCOUNTER — Institutional Professional Consult (permissible substitution): Payer: 59 | Admitting: Pulmonary Disease

## 2023-08-21 ENCOUNTER — Ambulatory Visit (INDEPENDENT_AMBULATORY_CARE_PROVIDER_SITE_OTHER): Payer: 59 | Admitting: Pulmonary Disease

## 2023-08-21 ENCOUNTER — Encounter: Payer: Self-pay | Admitting: Pulmonary Disease

## 2023-08-21 VITALS — BP 120/80 | HR 89 | Temp 97.1°F | Ht 60.0 in | Wt 139.0 lb

## 2023-08-21 DIAGNOSIS — R42 Dizziness and giddiness: Secondary | ICD-10-CM | POA: Diagnosis not present

## 2023-08-21 DIAGNOSIS — J449 Chronic obstructive pulmonary disease, unspecified: Secondary | ICD-10-CM

## 2023-08-21 DIAGNOSIS — J439 Emphysema, unspecified: Secondary | ICD-10-CM

## 2023-08-21 MED ORDER — BREZTRI AEROSPHERE 160-9-4.8 MCG/ACT IN AERO: 2.0000 | INHALATION_SPRAY | Freq: Two times a day (BID) | RESPIRATORY_TRACT | Status: DC

## 2023-08-21 NOTE — Progress Notes (Signed)
Synopsis: Referred in by Felicia Emms, NP   Subjective:   PATIENT ID: [name] GENDER: female DOB: 10-11-1940, MRN: [id number]  Chief Complaint  Patient presents with   Consult    DOE. No wheezing or cough.    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  She reports that she has been doing well on Trelegy Ellipta.  Has been using her albuterol inhaler only as needed however sometimes she needs to use it 4 times a day.  Her main complaint today was dizziness and this happens when she moves her head quickly.  Chest x-ray in 2023 with no acute findings.  ROS All systems were reviewed and are negative except for the above.  Objective:   Vitals:   08/21/23 1430  BP: 120/80  Pulse: 89  Temp: (!) 97.1 F (36.2 C)  SpO2: 98%  Weight: 139 lb (63 kg)  Height: 5' (1.524 m)   98% on RA BMI Readings from Last 3 Encounters:  08/21/23 27.15 kg/m  05/04/23 27.15 kg/m  04/05/23 26.44 kg/m   Wt Readings from Last 3 Encounters:  08/21/23 139 lb (63 kg)  05/04/23 139 lb (63 kg)  04/05/23 135 lb 6.4 oz (61.4 kg)    Physical Exam GEN: NAD, Healthy Appearing HEENT: Supple Neck, Reactive Pupils, EOMI  CVS: Normal S1, Normal S2, RRR, No murmurs or ES appreciated  Lungs: Clear bilateral air entry.  Abdomen: Soft, non tender, non distended, + BS  Extremities: Warm and well perfused, No edema  Skin: No suspicious lesions appreciated  Psych: Normal Affect  Ancillary Information   CBC    Component Value Date/Time   WBC 11.4 (H) 04/05/2023 1206   RBC 4.47 04/05/2023 1206   HGB 11.5 (L) 04/05/2023 1206   HGB 12.8 07/30/2018 1123   HGB 12.5 01/17/2018 0853   HGB 13.3 07/27/2017 0939   HCT 36.6 04/05/2023 1206   HCT 37.9 01/17/2018 0853   HCT 39.3 07/27/2017 0939   PLT 475.0 (H) 04/05/2023 1206   PLT 265 07/30/2018 1123    PLT 308 01/17/2018 0853   MCV 81.8 04/05/2023 1206   MCV 88 01/17/2018 0853   MCV 90.5 07/27/2017 0939   MCH 26.1 05/16/2022 2244   MCHC 31.4 04/05/2023 1206   RDW 16.8 (H) 04/05/2023 1206   RDW 15.3 01/17/2018 0853   RDW 13.8 07/27/2017 0939   LYMPHSABS 1.8 05/16/2022 2244   LYMPHSABS 1.5 07/27/2017 0939   MONOABS 0.6 05/16/2022 2244   MONOABS 0.4 07/27/2017 0939   EOSABS 0.1 05/16/2022 2244   EOSABS 0.1 07/27/2017 0939   BASOSABS 0.1 05/16/2022 2244   BASOSABS 0.0 07/27/2017 0939   Labs and imaging were reviewed.     Latest Ref Rng & Units 03/08/2016    9:28 AM  PFT Results  FVC-Pre L 1.96   FVC-Predicted Pre % 79   FVC-Post L 2.17   FVC-Predicted Post % 87   Pre FEV1/FVC % % 62   Post FEV1/FCV % % 59   FEV1-Pre L 1.21   FEV1-Predicted Pre % 65   FEV1-Post L 1.29   DLCO uncorrected ml/min/mmHg 14.10   DLCO UNC% % 72   DLCO corrected ml/min/mmHg 14.62   DLCO COR %Predicted % 74   DLVA Predicted % 75   TLC L 4.94   TLC % Predicted %  109   RV % Predicted % 129      Assessment & Plan:  [name] is an 83 year old female patient with a past medical history of stage II COPD with FEV1 69% of predicted in 2017 presenting today to the pulmonary service to establish care.  #Stage II COPD   []  C/w Trelegy ellipta 100 1puff once a day.  []  C/w Albuterol as needed.  []  Obtain PFTs.  []  Will consider ensifentrine on next visit.   #Dizziness  Likely BPPV however warrants further investigation possibly a brain MRI. Will discuss with her PCP.   Return in about 3 months (around 11/19/2023).  I spent 60 minutes caring for this patient today, including preparing to see the patient, obtaining a medical history , reviewing a separately obtained history, performing a medically appropriate examination and/or evaluation, counseling and educating the patient/family/caregiver, documenting clinical information in the electronic health record, and independently interpreting results  (not separately reported/billed) and communicating results to the patient/family/caregiver  Janann Colonel, MD [facility] 08/21/2023 3:35 PM

## 2023-08-25 ENCOUNTER — Telehealth: Payer: Self-pay

## 2023-08-25 DIAGNOSIS — K219 Gastro-esophageal reflux disease without esophagitis: Secondary | ICD-10-CM

## 2023-08-25 NOTE — Telephone Encounter (Signed)
Request Refill/Renewal Authorization   LAST APPOINTMENT DATE: 05/04/23 NEXT APPOINTMENT DATE: 08/28/2023 [facility]PCID LAST REFILL: 05/04/2023  QTY: #[id number] [phone]

## 2023-08-28 ENCOUNTER — Ambulatory Visit: Payer: 59 | Admitting: Nurse Practitioner

## 2023-08-28 MED ORDER — FAMOTIDINE 20 MG PO TABS: 20.0000 mg | ORAL_TABLET | Freq: Every day | ORAL | 0 refills | Status: DC

## 2023-08-28 NOTE — Addendum Note (Signed)
Addended by: Eden Emms on: 08/28/2023 01:37 PM   Modules accepted: Orders

## 2023-08-30 ENCOUNTER — Encounter: Payer: Self-pay | Admitting: Nurse Practitioner

## 2023-08-30 ENCOUNTER — Ambulatory Visit (INDEPENDENT_AMBULATORY_CARE_PROVIDER_SITE_OTHER): Payer: 59 | Admitting: Nurse Practitioner

## 2023-08-30 VITALS — BP 128/82 | HR 81 | Temp 98.0°F | Ht 61.0 in | Wt 137.8 lb

## 2023-08-30 DIAGNOSIS — N3281 Overactive bladder: Secondary | ICD-10-CM

## 2023-08-30 DIAGNOSIS — Z711 Person with feared health complaint in whom no diagnosis is made: Secondary | ICD-10-CM

## 2023-08-30 DIAGNOSIS — R42 Dizziness and giddiness: Secondary | ICD-10-CM | POA: Diagnosis not present

## 2023-08-30 DIAGNOSIS — R296 Repeated falls: Secondary | ICD-10-CM | POA: Diagnosis not present

## 2023-08-30 DIAGNOSIS — I10 Essential (primary) hypertension: Secondary | ICD-10-CM | POA: Diagnosis not present

## 2023-08-30 LAB — CBC
HCT: 33 % — ABNORMAL LOW (ref 36.0–46.0)
Hemoglobin: 10.3 g/dL — ABNORMAL LOW (ref 12.0–15.0)
MCHC: 31.3 g/dL (ref 30.0–36.0)
MCV: 76.5 fL — ABNORMAL LOW (ref 78.0–100.0)
Platelets: 402 10*3/uL — ABNORMAL HIGH (ref 150.0–400.0)
RBC: 4.32 Mil/uL (ref 3.87–5.11)
RDW: 18.6 % — ABNORMAL HIGH (ref 11.5–15.5)
WBC: 7.7 10*3/uL (ref 4.0–10.5)

## 2023-08-30 LAB — VITAMIN B12: Vitamin B-12: 303 pg/mL (ref 211–911)

## 2023-08-30 NOTE — Assessment & Plan Note (Signed)
 Patient currently on amlodipine  and lisinopril .  Continue medications prescribed looks like amlodipine  was never discontinued patient is not having any increased dizziness and blood pressure within normal limits continue medication as prescribed pending MRI brain

## 2023-08-30 NOTE — Assessment & Plan Note (Signed)
 Ambulatory to physical therapy.  Continuing ambulatory aids

## 2023-08-30 NOTE — Progress Notes (Signed)
 Established Patient Office Visit  Subjective   Patient ID: [name], female    DOB: 1941-07-09  Age: 83 y.o. MRN: [id number]  Chief Complaint  Patient presents with   Home Health    HPI   HTN/dizziness: she was seen by me and we started on the meclizine  and she is unsure if she is taking it. She can have it with sitting down and with standing. States that it is every day  Alter gait: Patient has not had any falls since last office it.  States that she does have a walker at home but does not use it she refers to grab onto furniture and belongings around her.  She is still experiencing some dizziness  Memory concern: Patient's daughter spoke to me outside the room was concerned for her mother's memory.  We did discuss this is multifactorial given the patient's age and medications.  We were anticipating an MRI of the brain for the dizziness this will be multifactorial scan at this juncture.  Will check patient's B12 to make sure supplementation is not required.  We can consider doing a full memory test at next office visit   Review of Systems  Constitutional:  Negative for chills and fever.  Respiratory:  Negative for shortness of breath.   Cardiovascular:  Negative for chest pain.  Genitourinary:  Positive for frequency.  Neurological:  Positive for dizziness. Negative for headaches.  Psychiatric/Behavioral:  Negative for hallucinations and suicidal ideas.       Objective:     BP 128/82   Pulse 81   Temp 98 F (36.7 C) (Oral)   Ht 5' 1 (1.549 m)   Wt 137 lb 12.8 oz (62.5 kg)   SpO2 91%   BMI 26.04 kg/m  BP Readings from Last 3 Encounters:  08/30/23 128/82  08/21/23 120/80  05/04/23 128/82   Wt Readings from Last 3 Encounters:  08/30/23 137 lb 12.8 oz (62.5 kg)  08/21/23 139 lb (63 kg)  05/04/23 139 lb (63 kg)   SpO2 Readings from Last 3 Encounters:  08/30/23 91%  08/21/23 98%  05/04/23 96%    Physical Exam Vitals and nursing note reviewed.   Constitutional:      Appearance: Normal appearance.  Cardiovascular:     Rate and Rhythm: Normal rate and regular rhythm.     Heart sounds: Normal heart sounds.  Pulmonary:     Effort: Pulmonary effort is normal.     Breath sounds: Normal breath sounds.  Neurological:     Mental Status: She is alert.      No results found for any visits on 08/30/23.    The ASCVD Risk score (Arnett DK, et al., 2019) failed to calculate for the following reasons:   The 2019 ASCVD risk score is only valid for ages 81 to 7   Risk score cannot be calculated because patient has a medical history suggesting prior/existing ASCVD    Assessment & Plan:   Problem List Items Addressed This Visit       Cardiovascular and Mediastinum   Essential hypertension - Primary   Patient currently on amlodipine  and lisinopril .  Continue medications prescribed looks like amlodipine  was never discontinued patient is not having any increased dizziness and blood pressure within normal limits continue medication as prescribed pending MRI brain      Relevant Medications   amLODipine  (NORVASC ) 5 MG tablet   Other Relevant Orders   CBC     Genitourinary   OAB (overactive  bladder)   Relevant Orders   Ambulatory referral to Urology     Other   Dizziness   Persistent even with reduction of blood pressure medication.  Patient is unsure if she is try meclizine .  Will obtain MRI brain to assess further causation.  Will refer patient to physical therapy for gait training patient encouraged to use ambulation aids      Relevant Orders   Ambulatory referral to Physical Therapy   MR Brain Wo Contrast   Frequent falls   Ambulatory to physical therapy.  Continuing ambulatory aids      Relevant Orders   Ambulatory referral to Physical Therapy   Concern about memory   Patient's daughter concerned about patient's memory.  We did state this can be multifactorial check CBC and this was abnormal and B12 level.  Pending MRI  brain      Relevant Orders   MR Brain Wo Contrast   Vitamin B12    Return in about 3 months (around 11/27/2023) for Dizziness/memory recheck .    Adina Crandall, NP

## 2023-08-30 NOTE — Assessment & Plan Note (Signed)
 Persistent even with reduction of blood pressure medication.  Patient is unsure if she is try meclizine .  Will obtain MRI brain to assess further causation.  Will refer patient to physical therapy for gait training patient encouraged to use ambulation aids

## 2023-08-30 NOTE — Patient Instructions (Addendum)
 Nice to see you today I will be in touch with the labs once I have them Follow up with me in 3 months, sooner if you need me

## 2023-08-30 NOTE — Assessment & Plan Note (Signed)
 Patient's daughter concerned about patient's memory.  We did state this can be multifactorial check CBC and this was abnormal and B12 level.  Pending MRI brain

## 2023-09-01 ENCOUNTER — Ambulatory Visit (INDEPENDENT_AMBULATORY_CARE_PROVIDER_SITE_OTHER): Payer: 59

## 2023-09-01 ENCOUNTER — Other Ambulatory Visit: Payer: Self-pay | Admitting: Nurse Practitioner

## 2023-09-01 DIAGNOSIS — R710 Precipitous drop in hematocrit: Secondary | ICD-10-CM

## 2023-09-01 DIAGNOSIS — R7989 Other specified abnormal findings of blood chemistry: Secondary | ICD-10-CM | POA: Diagnosis not present

## 2023-09-01 LAB — IBC + FERRITIN
Ferritin: 10.2 ng/mL (ref 10.0–291.0)
Iron: 20 ug/dL — ABNORMAL LOW (ref 42–145)
Saturation Ratios: 6.1 % — ABNORMAL LOW (ref 20.0–50.0)
TIBC: 330.4 ug/dL (ref 250.0–450.0)
Transferrin: 236 mg/dL (ref 212.0–360.0)

## 2023-09-01 NOTE — Addendum Note (Signed)
 Addended by: Bernadene Brewer on: 09/01/2023 08:47 AM   Modules accepted: Orders

## 2023-09-05 ENCOUNTER — Other Ambulatory Visit: Payer: Self-pay | Admitting: Nurse Practitioner

## 2023-09-05 ENCOUNTER — Telehealth: Payer: Self-pay | Admitting: Nurse Practitioner

## 2023-09-05 DIAGNOSIS — D509 Iron deficiency anemia, unspecified: Secondary | ICD-10-CM

## 2023-09-05 MED ORDER — IRON (FERROUS SULFATE) 325 (65 FE) MG PO TABS: 325.0000 mg | ORAL_TABLET | Freq: Every day | ORAL | 0 refills | Status: AC

## 2023-09-05 NOTE — Telephone Encounter (Signed)
Duplicate

## 2023-09-05 NOTE — Telephone Encounter (Signed)
Patient daughter called in returning a call she received in regards to [facility].

## 2023-09-05 NOTE — Telephone Encounter (Signed)
Copied from CRM [id number]. Topic: General - Other >> Sep 05, 2023 12:22 PM Truddie Crumble wrote: Reason for CRM: patient returning a call to the office

## 2023-09-06 NOTE — Telephone Encounter (Signed)
Called patient reviewed all information and repeated back to me.   Scheduled pt with 1 month lab visit 3/12 @1015AM .

## 2023-09-13 ENCOUNTER — Other Ambulatory Visit: Payer: Self-pay

## 2023-09-13 ENCOUNTER — Encounter: Payer: Self-pay | Admitting: Physical Therapy

## 2023-09-13 ENCOUNTER — Ambulatory Visit: Payer: 59 | Attending: Nurse Practitioner | Admitting: Physical Therapy

## 2023-09-13 DIAGNOSIS — R296 Repeated falls: Secondary | ICD-10-CM | POA: Insufficient documentation

## 2023-09-13 DIAGNOSIS — Z9181 History of falling: Secondary | ICD-10-CM | POA: Diagnosis not present

## 2023-09-13 DIAGNOSIS — M6281 Muscle weakness (generalized): Secondary | ICD-10-CM | POA: Diagnosis not present

## 2023-09-13 DIAGNOSIS — R42 Dizziness and giddiness: Secondary | ICD-10-CM | POA: Diagnosis not present

## 2023-09-13 DIAGNOSIS — R262 Difficulty in walking, not elsewhere classified: Secondary | ICD-10-CM | POA: Diagnosis not present

## 2023-09-13 DIAGNOSIS — R2681 Unsteadiness on feet: Secondary | ICD-10-CM | POA: Insufficient documentation
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
## 2023-09-18 ENCOUNTER — Other Ambulatory Visit: Payer: Self-pay

## 2023-09-18 ENCOUNTER — Ambulatory Visit: Payer: 59

## 2023-09-18 DIAGNOSIS — Z9181 History of falling: Secondary | ICD-10-CM

## 2023-09-18 DIAGNOSIS — R296 Repeated falls: Secondary | ICD-10-CM | POA: Diagnosis not present

## 2023-09-18 DIAGNOSIS — R42 Dizziness and giddiness: Secondary | ICD-10-CM | POA: Diagnosis not present

## 2023-09-18 DIAGNOSIS — R262 Difficulty in walking, not elsewhere classified: Secondary | ICD-10-CM

## 2023-09-18 DIAGNOSIS — R2681 Unsteadiness on feet: Secondary | ICD-10-CM

## 2023-09-18 DIAGNOSIS — M6281 Muscle weakness (generalized): Secondary | ICD-10-CM | POA: Diagnosis not present

## 2023-09-18 NOTE — Therapy (Signed)
 OUTPATIENT PHYSICAL THERAPY LOWER EXTREMITY EVALUATION   Patient Name: [name] MRN: [id number] DOB:Jul 01, 1941, 83 y.o., female Today's Date: 09/18/2023  END OF SESSION:    Past Medical History:  Diagnosis Date   Acute metabolic encephalopathy 06/02/2019   Adenocarcinoma of right lung, stage 1 (HCC) 07/28/2016   Anxiety    Cancer (HCC)    uterine   Closed fracture of left distal radius 06/13/2020   COPD (chronic obstructive pulmonary disease) (HCC)    Coronary artery disease    mild nonobstructive by 2019 cath   Depression    GERD (gastroesophageal reflux disease)    Hyperlipidemia    Hypertension    Low back pain    Osteoarthritis    Osteoporosis    Takotsubo cardiomyopathy    EF recovered to 50-55% by 01/23/18 echo   Vitamin D insufficiency 06/13/2020   Past Surgical History:  Procedure Laterality Date   ANKLE SURGERY Right    horse accident   APPENDECTOMY     BACK SURGERY     CHOLECYSTECTOMY     EYE SURGERY     lense implant   FRACTURE SURGERY     HIP SURGERY  01/2010   Left Hip    INNER EAR SURGERY Bilateral 1970   related to severe ear infections   KNEE SURGERY     Right knee   LEFT HEART CATH AND CORONARY ANGIOGRAPHY N/A 12/11/2017   Procedure: LEFT HEART CATH AND CORONARY ANGIOGRAPHY;  Surgeon: Corky Crafts, MD;  Location: [facility];  Service: Cardiovascular;  Laterality: N/A;   LOBECTOMY Right 05/09/2016   Procedure: RIGHT UPPER LOBECTOMY;  Surgeon: Loreli Slot, MD;  Location: [facility] OR;  Service: Thoracic;  Laterality: Right;   MASS EXCISION  08/25/2011   Procedure: EXCISION MASS;  Surgeon: Shelly Rubenstein, MD;  Location: [facility];  Service: General;  Laterality: N/A;  excision left neck mass   OPEN REDUCTION INTERNAL FIXATION (ORIF) DISTAL RADIAL FRACTURE Left 06/12/2020   Procedure: OPEN REDUCTION INTERNAL FIXATION (ORIF) DISTAL RADIAL FRACTURE;  Surgeon: Roby Lofts, MD;  Location: [facility] OR;  Service:  Orthopedics;  Laterality: Left;   TOTAL ABDOMINAL HYSTERECTOMY     TOTAL KNEE ARTHROPLASTY Right 05/27/2019   Procedure: TOTAL KNEE ARTHROPLASTY;  Surgeon: Dannielle Huh, MD;  Location: [facility];  Service: Orthopedics;  Laterality: Right;  75 mins needed for length of case   TOTAL KNEE ARTHROPLASTY Left 12/30/2019   Procedure: TOTAL KNEE ARTHROPLASTY;  Surgeon: Dannielle Huh, MD;  Location: [facility];  Service: Orthopedics;  Laterality: Left;   VIDEO ASSISTED THORACOSCOPY (VATS)/WEDGE RESECTION Right 05/09/2016   Procedure: VIDEO ASSISTED THORACOSCOPY (VATS)/WEDGE RESECTION;  Surgeon: Loreli Slot, MD;  Location: [facility] OR;  Service: Thoracic;  Laterality: Right;   Patient Active Problem List   Diagnosis Date Noted   Concern about memory 08/30/2023   OAB (overactive bladder) 08/30/2023   Gait abnormality 05/04/2023   Frequent falls 05/04/2023   Mass of left wrist 05/04/2023   Dizziness 04/05/2023   History of home oxygen therapy 04/05/2023   Urinary frequency 02/20/2023   Nocturnal enuresis 02/20/2023   Pneumonia due to COVID-19 virus 07/28/2021   Dyslipidemia 07/28/2021   Cystitis 05/11/2021   Sinus bradycardia 05/11/2021   Acute on chronic anemia 05/11/2021   Closed fracture of left distal radius 06/13/2020   Vitamin D insufficiency 06/13/2020   Greater trochanter fracture (HCC), left 06/11/2020   History of total knee replacement 06/11/2019   S/P total  knee replacement 05/27/2019   DDD (degenerative disc disease), 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   Bilateral renal cysts 05/21/2018   Hiatal hernia 05/21/2018   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   Thoracic radiculitis (Bilateral) (L>R) 05/03/2018   Chronic thoracic back pain (Left) 05/03/2018   Injury of peripheral nerves of thorax, sequela 03/22/2018   Chest pain 03/21/2018   Rib pain (Left) 03/21/2018   Chronic rib pain (Right) 03/20/2018   DDD (degenerative disc disease), lumbar 03/20/2018   Chronic fracture of pubic ramus, sequela  (Right) 03/20/2018   History of femoral neck fracture, sequela (Left) 03/20/2018    Class: History of   Lumbar facet arthropathy (Bilateral) 03/20/2018   Lumbar facet syndrome (Bilateral) 03/20/2018   Osteoarthritis of facet joint of lumbar spine 03/20/2018   Spondylosis without myelopathy or radiculopathy, lumbosacral region 03/20/2018   Other specified dorsopathies, sacral and sacrococcygeal region 03/20/2018   Chronic pain syndrome 02/26/2018   Cancer related pain 02/26/2018   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   Chronic low back pain (Secondary Area of Pain) (Bilateral) 02/26/2018   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   Chronic lower extremity pain (Tertiary Area of Pain) (Bilateral) (R>L) 02/26/2018   Chronic hip pain (Fourth Area of Pain) (Bilateral) (R>L) 02/26/2018   Neurogenic pain 02/26/2018   Pharmacologic therapy 02/26/2018   Problems influencing health status 02/26/2018   Long term prescription benzodiazepine use 02/26/2018   Long term prescription opiate use 02/26/2018   History of uterine cancer 12/08/2017   Generalized anxiety disorder 12/08/2017   GERD (gastroesophageal reflux disease) 12/08/2017   Substernal chest pain 12/08/2017   Chronic sacroiliac joint pain 06/21/2017   Disorder of skeletal system 06/21/2017   Other long term (current) drug therapy 06/21/2017   Other specified health status 06/21/2017   Chronic chest wall pain (Primary Area of Pain) 06/21/2017   Chronic post-thoracotomy pain 03/03/2017   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   Adenocarcinoma of right lung, stage 1 (HCC) 07/28/2016   S/P lobectomy of lung 05/09/2016   DVT (deep venous thrombosis) (HCC) 04/26/2016   Heart murmur    Essential hypertension 08/29/2015   Hyperlipidemia 08/29/2015   CKD (chronic kidney disease), stage III (HCC) - baseline SCr 1.3-1.5 08/29/2015   COPD (chronic obstructive pulmonary  disease) (HCC) 04/27/2015   Infected sebaceous cyst 08/16/2011    PCP: Mordecai Maes MD   REFERRING PROVIDER: Eden Emms, NP  REFERRING DIAG: R29.6 (ICD-10-CM) - Frequent falls R42 (ICD-10-CM) - Dizziness  THERAPY DIAG:  No diagnosis found.  Rationale for Evaluation and Treatment: Rehabilitation  ONSET DATE: chronic   SUBJECTIVE:   SUBJECTIVE STATEMENT: 09/18/23: no new issues, I just get short of breath sometimes  Tripped over a box that my son left on the porch, had a big bruise on my face for awhile. Get dizzy when I get up real fast, I have to move slow. Have had issues with being unsteady for awhile, its nothing new.   PERTINENT HISTORY: See above  PAIN:  Are you having pain? No  PRECAUTIONS: Fall  RED FLAGS: None   WEIGHT BEARING RESTRICTIONS: No  FALLS:  Has patient fallen in last 6 months? Yes. Number of falls 1; tripped on box on the porch   LIVING ENVIRONMENT: Lives with: lives alone but son and daughter live nearby and take good care of her  Lives in: House/apartment Stairs: 4-5 STE with rails; level home once inside  Has following equipment at home: Single point cane,  Walker - 2 wheeled, and Wheelchair (manual)  OCCUPATION: retired   PLOF: Independent, Independent with basic ADLs, Independent with gait, and Independent with transfers  PATIENT GOALS: "I'm not sure I feel pretty ok as is"   NEXT MD VISIT: Referring 11/27/23   OBJECTIVE:  Note: Objective measures were completed at Evaluation unless otherwise noted.    PATIENT SURVEYS:  ABC scale 70%  COGNITION: Overall cognitive status: History of cognitive impairments - at baseline; 2/3 on 3 word recall test      SENSATION: Not tested     LOWER EXTREMITY MMT:  MMT Right eval Left eval  Hip flexion 3+ 3+  Hip extension    Hip abduction 3+ 3+  Hip adduction    Hip internal rotation    Hip external rotation    Knee flexion 5 5  Knee extension 5 5  Ankle dorsiflexion 5 5  Ankle  plantarflexion    Ankle inversion    Ankle eversion     (Blank rows = not tested)    FUNCTIONAL TESTS:  5 times sit to stand: 16 seconds with BUEs due to posterior lean  Timed up and go (TUG): 14.5 seconds min guard/no device extended time to turn around cone  3 minute walk test: 321ft one seated rest break, increased WOB and unsteadiness dramatically increased along with fatigue   GAIT: Distance walked: in clinic distances  Assistive device utilized: None Level of assistance: SBA Comments: generally unsteady, drifting R/L and slow gait speed                                                                                                                                 TREATMENT DATE:  09/18/23:  Practiced home program with print outs from original PT visit: Red t band around knees for marching Red t band around knees for hip abd/ER Sit to stand 5 x ,2 sets Gait x 300', marked antalgia L LE on second lap, then SOB after 3rd lap, utilized inhaler Seated long arc quads 2# 10x  Standing hip abd 2# Standing hip ext 2# Seated hip abd with ball squeeze 5 sec holds, 10 x Leg press into compliant side of BOSU, 5 sec holds, 10 x each leg Nustep level 5, Ue's and LE's, 5 min     09/13/23  Eval, care planning, HEP   Marches red TB x10 B Seated hip ABD red TB x10 STS x5 no UEs       PATIENT EDUCATION:  Education details: exam findings, POC, HEP  Person educated: Patient Education method: Programmer, multimedia, Facilities manager, Verbal cues, and Handouts Education comprehension: verbalized understanding, returned demonstration, and needs further education  HOME EXERCISE PROGRAM: Access Code: [email or web address] URL: [email or web address] Date: 09/13/2023 Prepared by: Nedra Hai  Exercises - Seated March with Resistance  - 1 x daily - 7 x weekly - 2 sets - 10 reps - 2 seconds  hold - Seated Hip Abduction with Resistance  -  1 x daily - 7 x weekly - 2 sets - 10 reps - 2 seconds   hold - Sit to Stand with Arms Crossed  - 1 x daily - 7 x weekly - 2 sets - 5 reps  ASSESSMENT:  CLINICAL IMPRESSION: Patient is a 83 y.o. F who was participated in physical therapy treatment today for R29.6 (ICD-10-CM) - Frequent falls R42 (ICD-10-CM) - Dizziness. Today was her first treatment session following her initial evaluation.  She was pleasant and cooperative.  Required cuing several times to remain on task, and for pacing her activities and safety awareness.  Continues to be a good candidate for skilled PT to address her deficits, reduce her fall risk.   OBJECTIVE IMPAIRMENTS: Abnormal gait, decreased activity tolerance, decreased balance, decreased cognition, decreased coordination, decreased mobility, difficulty walking, decreased strength, and decreased safety awareness.   ACTIVITY LIMITATIONS: standing, squatting, stairs, transfers, bathing, toileting, dressing, and locomotion level  PARTICIPATION LIMITATIONS: meal prep, cleaning, laundry, shopping, community activity, and yard work  PERSONAL FACTORS: Age, Behavior pattern, Education, Fitness, Past/current experiences, Social background, and Time since onset of injury/illness/exacerbation are also affecting patient's functional outcome.   REHAB POTENTIAL: Good  CLINICAL DECISION MAKING: Stable/uncomplicated  EVALUATION COMPLEXITY: Low   GOALS: Goals reviewed with patient? No  SHORT TERM GOALS: Target date: 10/11/2023   Will be compliant with appropriate progressive HEP  Baseline: Goal status: INITIAL    2.  Will be able to name 3 ways to prevent falls at home and in the community  Baseline:  Goal status: INITIAL  3.  Will complete TUG in 10 seconds or less with LRAD, minimal unsteadiness   Baseline:  Goal status: INITIAL    LONG TERM GOALS: Target date: 11/08/2023    MMT to have improved by at least one grade in all weak groups  Baseline:  Goal status: INITIAL  2.  Will complete 5xSTS test in 12 seconds  or less to show improved mobility and strength, no posterior LOB  Baseline:  Goal status: INITIAL  3.  Will score at least 48 on Berg to show reduced fall risk  Baseline:  Goal status: INITIAL    4.  Will score 85% on ABC survey to show improved subjective status  Baseline:  Goal status: INITIAL  5. Will ambulate at least 468ft in without fatigue to improve community access  Baseline: Goal status: INITIAL      PLAN:  PT FREQUENCY: 1-2x/week  PT DURATION: 8 weeks  PLANNED INTERVENTIONS: 97110-Therapeutic exercises, 97530- Therapeutic activity, 97112- Neuromuscular re-education, 97535- Self Care, 16109- Manual therapy, and 97116- Gait training  PLAN FOR NEXT SESSION: balance, strength, general conditioning as tolerated   Mindy Gali, PT, DPT, OCS 09/18/23 1:48 PM

## 2023-09-25 ENCOUNTER — Other Ambulatory Visit: Payer: Self-pay

## 2023-09-25 ENCOUNTER — Ambulatory Visit: Payer: 59 | Attending: Nurse Practitioner

## 2023-09-25 DIAGNOSIS — M6281 Muscle weakness (generalized): Secondary | ICD-10-CM | POA: Diagnosis not present

## 2023-09-25 DIAGNOSIS — R2681 Unsteadiness on feet: Secondary | ICD-10-CM | POA: Diagnosis not present

## 2023-09-25 DIAGNOSIS — Z9181 History of falling: Secondary | ICD-10-CM | POA: Diagnosis not present

## 2023-09-25 DIAGNOSIS — R262 Difficulty in walking, not elsewhere classified: Secondary | ICD-10-CM | POA: Diagnosis not present

## 2023-09-25 NOTE — Therapy (Signed)
 OUTPATIENT PHYSICAL THERAPY LOWER EXTREMITY EVALUATION   Patient Name: [name] MRN: [id number] DOB:1940/10/10, 83 y.o., female Today's Date: 09/25/2023  END OF SESSION:  PT End of Session - 09/25/23 1154     Visit Number 3    Date for PT Re-Evaluation 11/08/23    Authorization Type [employer] Dual    Authorization Time Period 09/13/23 to 11/08/23    Progress Note Due on Visit 10    PT Start Time 1100    PT Stop Time 1140    PT Time Calculation (min) 40 min    Activity Tolerance Patient tolerated treatment well    Behavior During Therapy [facility] for tasks assessed/performed              Past Medical History:  Diagnosis Date   Acute metabolic encephalopathy 06/02/2019   Adenocarcinoma of right lung, stage 1 (HCC) 07/28/2016   Anxiety    Cancer (HCC)    uterine   Closed fracture of left distal radius 06/13/2020   COPD (chronic obstructive pulmonary disease) (HCC)    Coronary artery disease    mild nonobstructive by 2019 cath   Depression    GERD (gastroesophageal reflux disease)    Hyperlipidemia    Hypertension    Low back pain    Osteoarthritis    Osteoporosis    Takotsubo cardiomyopathy    EF recovered to 50-55% by 01/23/18 echo   Vitamin D insufficiency 06/13/2020   Past Surgical History:  Procedure Laterality Date   ANKLE SURGERY Right    horse accident   APPENDECTOMY     BACK SURGERY     CHOLECYSTECTOMY     EYE SURGERY     lense implant   FRACTURE SURGERY     HIP SURGERY  01/2010   Left Hip    INNER EAR SURGERY Bilateral 1970   related to severe ear infections   KNEE SURGERY     Right knee   LEFT HEART CATH AND CORONARY ANGIOGRAPHY N/A 12/11/2017   Procedure: LEFT HEART CATH AND CORONARY ANGIOGRAPHY;  Surgeon: Corky Crafts, MD;  Location: [facility];  Service: Cardiovascular;  Laterality: N/A;   LOBECTOMY Right 05/09/2016   Procedure: RIGHT UPPER LOBECTOMY;  Surgeon: Loreli Slot, MD;  Location: [facility] OR;  Service: Thoracic;   Laterality: Right;   MASS EXCISION  08/25/2011   Procedure: EXCISION MASS;  Surgeon: Shelly Rubenstein, MD;  Location:  SURGERY CENTER;  Service: General;  Laterality: N/A;  excision left neck mass   OPEN REDUCTION INTERNAL FIXATION (ORIF) DISTAL RADIAL FRACTURE Left 06/12/2020   Procedure: OPEN REDUCTION INTERNAL FIXATION (ORIF) DISTAL RADIAL FRACTURE;  Surgeon: Roby Lofts, MD;  Location: [facility] OR;  Service: Orthopedics;  Laterality: Left;   TOTAL ABDOMINAL HYSTERECTOMY     TOTAL KNEE ARTHROPLASTY Right 05/27/2019   Procedure: TOTAL KNEE ARTHROPLASTY;  Surgeon: Dannielle Huh, MD;  Location: [facility] ORS;  Service: Orthopedics;  Laterality: Right;  75 mins needed for length of case   TOTAL KNEE ARTHROPLASTY Left 12/30/2019   Procedure: TOTAL KNEE ARTHROPLASTY;  Surgeon: Dannielle Huh, MD;  Location: [facility] ORS;  Service: Orthopedics;  Laterality: Left;   VIDEO ASSISTED THORACOSCOPY (VATS)/WEDGE RESECTION Right 05/09/2016   Procedure: VIDEO ASSISTED THORACOSCOPY (VATS)/WEDGE RESECTION;  Surgeon: Loreli Slot, MD;  Location: [facility] OR;  Service: Thoracic;  Laterality: Right;   Patient Active Problem List   Diagnosis Date Noted   Concern about memory 08/30/2023   OAB (overactive bladder) 08/30/2023  Gait abnormality 05/04/2023   Frequent falls 05/04/2023   Mass of left wrist 05/04/2023   Dizziness 04/05/2023   History of home oxygen therapy 04/05/2023   Urinary frequency 02/20/2023   Nocturnal enuresis 02/20/2023   Pneumonia due to COVID-19 virus 07/28/2021   Dyslipidemia 07/28/2021   Cystitis 05/11/2021   Sinus bradycardia 05/11/2021   Acute on chronic anemia 05/11/2021   Closed fracture of left distal radius 06/13/2020   Vitamin D insufficiency 06/13/2020   Greater trochanter fracture (HCC), left 06/11/2020   History of total knee replacement 06/11/2019   S/P total knee replacement 05/27/2019   DDD (degenerative disc disease), 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   Bilateral renal cysts 05/21/2018    Hiatal hernia 05/21/2018   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   Thoracic radiculitis (Bilateral) (L>R) 05/03/2018   Chronic thoracic back pain (Left) 05/03/2018   Injury of peripheral nerves of thorax, sequela 03/22/2018   Chest pain 03/21/2018   Rib pain (Left) 03/21/2018   Chronic rib pain (Right) 03/20/2018   DDD (degenerative disc disease), lumbar 03/20/2018   Chronic fracture of pubic ramus, sequela (Right) 03/20/2018   History of femoral neck fracture, sequela (Left) 03/20/2018    Class: History of   Lumbar facet arthropathy (Bilateral) 03/20/2018   Lumbar facet syndrome (Bilateral) 03/20/2018   Osteoarthritis of facet joint of lumbar spine 03/20/2018   Spondylosis without myelopathy or radiculopathy, lumbosacral region 03/20/2018   Other specified dorsopathies, sacral and sacrococcygeal region 03/20/2018   Chronic pain syndrome 02/26/2018   Cancer related pain 02/26/2018   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   Chronic low back pain (Secondary Area of Pain) (Bilateral) 02/26/2018   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   Chronic lower extremity pain (Tertiary Area of Pain) (Bilateral) (R>L) 02/26/2018   Chronic hip pain (Fourth Area of Pain) (Bilateral) (R>L) 02/26/2018   Neurogenic pain 02/26/2018   Pharmacologic therapy 02/26/2018   Problems influencing health status 02/26/2018   Long term prescription benzodiazepine use 02/26/2018   Long term prescription opiate use 02/26/2018   History of uterine cancer 12/08/2017   Generalized anxiety disorder 12/08/2017   GERD (gastroesophageal reflux disease) 12/08/2017   Substernal chest pain 12/08/2017   Chronic sacroiliac joint pain 06/21/2017   Disorder of skeletal system 06/21/2017   Other long term (current) drug therapy 06/21/2017   Other specified health status 06/21/2017   Chronic chest wall pain (Primary Area of Pain) 06/21/2017   Chronic  post-thoracotomy pain 03/03/2017   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   Adenocarcinoma of right lung, stage 1 (HCC) 07/28/2016   S/P lobectomy of lung 05/09/2016   DVT (deep venous thrombosis) (HCC) 04/26/2016   Heart murmur    Essential hypertension 08/29/2015   Hyperlipidemia 08/29/2015   CKD (chronic kidney disease), stage III (HCC) - baseline SCr 1.3-1.5 08/29/2015   COPD (chronic obstructive pulmonary disease) (HCC) 04/27/2015   Infected sebaceous cyst 08/16/2011    PCP: Mordecai Maes MD   REFERRING PROVIDER: Eden Emms, NP  REFERRING DIAG: R29.6 (ICD-10-CM) - Frequent falls R42 (ICD-10-CM) - Dizziness  THERAPY DIAG:  Unsteadiness on feet  Muscle weakness (generalized)  History of falling  Difficulty in walking, not elsewhere classified  Rationale for Evaluation and Treatment: Rehabilitation  ONSET DATE: chronic   SUBJECTIVE:   SUBJECTIVE STATEMENT: 09/18/23: no new issues, I just get short of breath sometimes  Tripped over a box that my son left on the porch, had a big bruise on my face for  awhile. Get dizzy when I get up real fast, I have to move slow. Have had issues with being unsteady for awhile, its nothing new.   PERTINENT HISTORY: See above  PAIN:  Are you having pain? No  PRECAUTIONS: Fall  RED FLAGS: None   WEIGHT BEARING RESTRICTIONS: No  FALLS:  Has patient fallen in last 6 months? Yes. Number of falls 1; tripped on box on the porch   LIVING ENVIRONMENT: Lives with: lives alone but son and daughter live nearby and take good care of her  Lives in: House/apartment Stairs: 4-5 STE with rails; level home once inside  Has following equipment at home: Single point cane, Environmental consultant - 2 wheeled, and Wheelchair (manual)  OCCUPATION: retired   PLOF: Independent, Independent with basic ADLs, Independent with gait, and Independent with transfers  PATIENT GOALS: "I'm not sure I feel pretty ok as is"   NEXT MD VISIT: Referring 11/27/23    OBJECTIVE:  Note: Objective measures were completed at Evaluation unless otherwise noted.    PATIENT SURVEYS:  ABC scale 70%  COGNITION: Overall cognitive status: History of cognitive impairments - at baseline; 2/3 on 3 word recall test      SENSATION: Not tested     LOWER EXTREMITY MMT:  MMT Right eval Left eval  Hip flexion 3+ 3+  Hip extension    Hip abduction 3+ 3+  Hip adduction    Hip internal rotation    Hip external rotation    Knee flexion 5 5  Knee extension 5 5  Ankle dorsiflexion 5 5  Ankle plantarflexion    Ankle inversion    Ankle eversion     (Blank rows = not tested)    FUNCTIONAL TESTS:  5 times sit to stand: 16 seconds with BUEs due to posterior lean  Timed up and go (TUG): 14.5 seconds min guard/no device extended time to turn around cone  3 minute walk test: 332ft one seated rest break, increased WOB and unsteadiness dramatically increased along with fatigue   GAIT: Distance walked: in clinic distances  Assistive device utilized: None Level of assistance: SBA Comments: generally unsteady, drifting R/L and slow gait speed                                                                                                                                 TREATMENT DATE:  09/24/23 Seated hip abd with ball squeeze 5 sec holds, 10 x Leg press into compliant side of BOSU, 5 sec holds, 10 x 2 each leg Nustep L 5 Ue's and LE's 6  Standing for heel/ toe rocks B UE support Standing for alt toe taps to 8" step , B UE support Seated long arc quads 2# 10 x 1 each Seated marching with 2 # cuff wts 1 x 10, attempted with alt punches but pt unable to coordinate Standing alt hip abd with 2 # cuff wts ankles 10 x each Standing  alt hip ext with 2# cuff wts ankles 10 x each Walking indoors, 3 laps, 300' with SBA Sit to stand with B UE support 2 x 10  09/18/23:  Practiced home program with print outs from original PT visit: Red t band around knees for  marching Red t band around knees for hip abd/ER Sit to stand 5 x ,2 sets Gait x 300', marked antalgia L LE on second lap, then SOB after 3rd lap, utilized inhaler Seated long arc quads 2# 10x  Standing hip abd 2# Standing hip ext 2# Seated hip abd with ball squeeze 5 sec holds, 10 x Leg press into compliant side of BOSU, 5 sec holds, 10 x each leg Nustep level 5, Ue's and LE's, 5 min     09/13/23  Eval, care planning, HEP   Marches red TB x10 B Seated hip ABD red TB x10 STS x5 no UEs       PATIENT EDUCATION:  Education details: exam findings, POC, HEP  Person educated: Patient Education method: Programmer, multimedia, Facilities manager, Verbal cues, and Handouts Education comprehension: verbalized understanding, returned demonstration, and needs further education  HOME EXERCISE PROGRAM: Access Code: [email or web address] URL: [email or web address] Date: 09/13/2023 Prepared by: Nedra Hai  Exercises - Seated March with Resistance  - 1 x daily - 7 x weekly - 2 sets - 10 reps - 2 seconds  hold - Seated Hip Abduction with Resistance  - 1 x daily - 7 x weekly - 2 sets - 10 reps - 2 seconds  hold - Sit to Stand with Arms Crossed  - 1 x daily - 7 x weekly - 2 sets - 5 reps  ASSESSMENT:  CLINICAL IMPRESSION: Patient is a 83 y.o. F who was participated in physical therapy treatment today for R29.6 (ICD-10-CM) - Frequent falls R42 (ICD-10-CM) - Dizziness. Today was her second treatment session following her initial evaluation.  She was pleasant and cooperative.  Required cuing several times to remain on task, and for pacing her activities and safety awareness.  Has many postural changes due to diffuse arthritis B LE's .  Continues to be a good candidate for skilled PT to address her deficits, reduce her fall risk.   OBJECTIVE IMPAIRMENTS: Abnormal gait, decreased activity tolerance, decreased balance, decreased cognition, decreased coordination, decreased mobility, difficulty walking,  decreased strength, and decreased safety awareness.   ACTIVITY LIMITATIONS: standing, squatting, stairs, transfers, bathing, toileting, dressing, and locomotion level  PARTICIPATION LIMITATIONS: meal prep, cleaning, laundry, shopping, community activity, and yard work  PERSONAL FACTORS: Age, Behavior pattern, Education, Fitness, Past/current experiences, Social background, and Time since onset of injury/illness/exacerbation are also affecting patient's functional outcome.   REHAB POTENTIAL: Good  CLINICAL DECISION MAKING: Stable/uncomplicated  EVALUATION COMPLEXITY: Low   GOALS: Goals reviewed with patient? No  SHORT TERM GOALS: Target date: 10/11/2023   Will be compliant with appropriate progressive HEP  Baseline: Goal status: INITIAL    2.  Will be able to name 3 ways to prevent falls at home and in the community  Baseline:  Goal status: INITIAL  3.  Will complete TUG in 10 seconds or less with LRAD, minimal unsteadiness   Baseline:  Goal status: INITIAL    LONG TERM GOALS: Target date: 11/08/2023    MMT to have improved by at least one grade in all weak groups  Baseline:  Goal status: INITIAL  2.  Will complete 5xSTS test in 12 seconds or less to show improved mobility and strength, no posterior LOB  Baseline:  Goal  status: INITIAL  3.  Will score at least 48 on Berg to show reduced fall risk  Baseline:  Goal status: INITIAL    4.  Will score 85% on ABC survey to show improved subjective status  Baseline:  Goal status: INITIAL  5. Will ambulate at least 46ft in without fatigue to improve community access  Baseline: Goal status: INITIAL      PLAN:  PT FREQUENCY: 1-2x/week  PT DURATION: 8 weeks  PLANNED INTERVENTIONS: 97110-Therapeutic exercises, 97530- Therapeutic activity, 97112- Neuromuscular re-education, 97535- Self Care, 40102- Manual therapy, and 97116- Gait training  PLAN FOR NEXT SESSION: balance, strength, general conditioning as  tolerated   Maymunah Stegemann, PT, DPT, OCS 09/25/23 11:57 AM

## 2023-09-27 ENCOUNTER — Encounter: Payer: Self-pay | Admitting: Physical Therapy

## 2023-09-27 ENCOUNTER — Ambulatory Visit: Payer: 59 | Admitting: Physical Therapy

## 2023-09-27 DIAGNOSIS — R2681 Unsteadiness on feet: Secondary | ICD-10-CM | POA: Diagnosis not present

## 2023-09-27 DIAGNOSIS — R262 Difficulty in walking, not elsewhere classified: Secondary | ICD-10-CM | POA: Diagnosis not present

## 2023-09-27 DIAGNOSIS — M6281 Muscle weakness (generalized): Secondary | ICD-10-CM | POA: Diagnosis not present

## 2023-09-27 DIAGNOSIS — Z9181 History of falling: Secondary | ICD-10-CM

## 2023-09-27 NOTE — Therapy (Signed)
 OUTPATIENT PHYSICAL THERAPY LOWER EXTREMITY TREATMENT    Patient Name: [name] MRN: [id number] DOB:02-19-1941, 83 y.o., female Today's Date: 09/27/2023  END OF SESSION:  PT End of Session - 09/27/23 1310     Visit Number 4    Number of Visits 17    Date for PT Re-Evaluation 11/08/23    Authorization Type [employer] Dual    Authorization Time Period 09/13/23 to 11/08/23    Progress Note Due on Visit 10    PT Start Time 1302    PT Stop Time 1341    PT Time Calculation (min) 39 min    Activity Tolerance Patient tolerated treatment well    Behavior During Therapy [facility] for tasks assessed/performed               Past Medical History:  Diagnosis Date   Acute metabolic encephalopathy 06/02/2019   Adenocarcinoma of right lung, stage 1 (HCC) 07/28/2016   Anxiety    Cancer (HCC)    uterine   Closed fracture of left distal radius 06/13/2020   COPD (chronic obstructive pulmonary disease) (HCC)    Coronary artery disease    mild nonobstructive by 2019 cath   Depression    GERD (gastroesophageal reflux disease)    Hyperlipidemia    Hypertension    Low back pain    Osteoarthritis    Osteoporosis    Takotsubo cardiomyopathy    EF recovered to 50-55% by 01/23/18 echo   Vitamin D insufficiency 06/13/2020   Past Surgical History:  Procedure Laterality Date   ANKLE SURGERY Right    horse accident   APPENDECTOMY     BACK SURGERY     CHOLECYSTECTOMY     EYE SURGERY     lense implant   FRACTURE SURGERY     HIP SURGERY  01/2010   Left Hip    INNER EAR SURGERY Bilateral 1970   related to severe ear infections   KNEE SURGERY     Right knee   LEFT HEART CATH AND CORONARY ANGIOGRAPHY N/A 12/11/2017   Procedure: LEFT HEART CATH AND CORONARY ANGIOGRAPHY;  Surgeon: Corky Crafts, MD;  Location: [facility];  Service: Cardiovascular;  Laterality: N/A;   LOBECTOMY Right 05/09/2016   Procedure: RIGHT UPPER LOBECTOMY;  Surgeon: Loreli Slot, MD;  Location: [facility] OR;   Service: Thoracic;  Laterality: Right;   MASS EXCISION  08/25/2011   Procedure: EXCISION MASS;  Surgeon: Shelly Rubenstein, MD;  Location: [facility];  Service: General;  Laterality: N/A;  excision left neck mass   OPEN REDUCTION INTERNAL FIXATION (ORIF) DISTAL RADIAL FRACTURE Left 06/12/2020   Procedure: OPEN REDUCTION INTERNAL FIXATION (ORIF) DISTAL RADIAL FRACTURE;  Surgeon: Roby Lofts, MD;  Location: [facility] OR;  Service: Orthopedics;  Laterality: Left;   TOTAL ABDOMINAL HYSTERECTOMY     TOTAL KNEE ARTHROPLASTY Right 05/27/2019   Procedure: TOTAL KNEE ARTHROPLASTY;  Surgeon: Dannielle Huh, MD;  Location: [facility];  Service: Orthopedics;  Laterality: Right;  75 mins needed for length of case   TOTAL KNEE ARTHROPLASTY Left 12/30/2019   Procedure: TOTAL KNEE ARTHROPLASTY;  Surgeon: Dannielle Huh, MD;  Location: [facility];  Service: Orthopedics;  Laterality: Left;   VIDEO ASSISTED THORACOSCOPY (VATS)/WEDGE RESECTION Right 05/09/2016   Procedure: VIDEO ASSISTED THORACOSCOPY (VATS)/WEDGE RESECTION;  Surgeon: Loreli Slot, MD;  Location: [facility] OR;  Service: Thoracic;  Laterality: Right;   Patient Active Problem List   Diagnosis Date Noted   Concern about memory  08/30/2023   OAB (overactive bladder) 08/30/2023   Gait abnormality 05/04/2023   Frequent falls 05/04/2023   Mass of left wrist 05/04/2023   Dizziness 04/05/2023   History of home oxygen therapy 04/05/2023   Urinary frequency 02/20/2023   Nocturnal enuresis 02/20/2023   Pneumonia due to COVID-19 virus 07/28/2021   Dyslipidemia 07/28/2021   Cystitis 05/11/2021   Sinus bradycardia 05/11/2021   Acute on chronic anemia 05/11/2021   Closed fracture of left distal radius 06/13/2020   Vitamin D insufficiency 06/13/2020   Greater trochanter fracture (HCC), left 06/11/2020   History of total knee replacement 06/11/2019   S/P total knee replacement 05/27/2019   DDD (degenerative disc disease), 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   Bilateral  renal cysts 05/21/2018   Hiatal hernia 05/21/2018   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   Thoracic radiculitis (Bilateral) (L>R) 05/03/2018   Chronic thoracic back pain (Left) 05/03/2018   Injury of peripheral nerves of thorax, sequela 03/22/2018   Chest pain 03/21/2018   Rib pain (Left) 03/21/2018   Chronic rib pain (Right) 03/20/2018   DDD (degenerative disc disease), lumbar 03/20/2018   Chronic fracture of pubic ramus, sequela (Right) 03/20/2018   History of femoral neck fracture, sequela (Left) 03/20/2018    Class: History of   Lumbar facet arthropathy (Bilateral) 03/20/2018   Lumbar facet syndrome (Bilateral) 03/20/2018   Osteoarthritis of facet joint of lumbar spine 03/20/2018   Spondylosis without myelopathy or radiculopathy, lumbosacral region 03/20/2018   Other specified dorsopathies, sacral and sacrococcygeal region 03/20/2018   Chronic pain syndrome 02/26/2018   Cancer related pain 02/26/2018   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   Chronic low back pain (Secondary Area of Pain) (Bilateral) 02/26/2018   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   Chronic lower extremity pain (Tertiary Area of Pain) (Bilateral) (R>L) 02/26/2018   Chronic hip pain (Fourth Area of Pain) (Bilateral) (R>L) 02/26/2018   Neurogenic pain 02/26/2018   Pharmacologic therapy 02/26/2018   Problems influencing health status 02/26/2018   Long term prescription benzodiazepine use 02/26/2018   Long term prescription opiate use 02/26/2018   History of uterine cancer 12/08/2017   Generalized anxiety disorder 12/08/2017   GERD (gastroesophageal reflux disease) 12/08/2017   Substernal chest pain 12/08/2017   Chronic sacroiliac joint pain 06/21/2017   Disorder of skeletal system 06/21/2017   Other long term (current) drug therapy 06/21/2017   Other specified health status 06/21/2017   Chronic chest wall pain (Primary Area of Pain) 06/21/2017    Chronic post-thoracotomy pain 03/03/2017   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   Adenocarcinoma of right lung, stage 1 (HCC) 07/28/2016   S/P lobectomy of lung 05/09/2016   DVT (deep venous thrombosis) (HCC) 04/26/2016   Heart murmur    Essential hypertension 08/29/2015   Hyperlipidemia 08/29/2015   CKD (chronic kidney disease), stage III (HCC) - baseline SCr 1.3-1.5 08/29/2015   COPD (chronic obstructive pulmonary disease) (HCC) 04/27/2015   Infected sebaceous cyst 08/16/2011    PCP: Mordecai Maes MD   REFERRING PROVIDER: Eden Emms, NP  REFERRING DIAG: R29.6 (ICD-10-CM) - Frequent falls R42 (ICD-10-CM) - Dizziness  THERAPY DIAG:  Unsteadiness on feet  Muscle weakness (generalized)  History of falling  Difficulty in walking, not elsewhere classified  Rationale for Evaluation and Treatment: Rehabilitation  ONSET DATE: chronic   SUBJECTIVE:   SUBJECTIVE STATEMENT:  Doing OK today, my knees are hurting today from the weather    EVAL: Tripped over a box that my  son left on the porch, had a big bruise on my face for awhile. Get dizzy when I get up real fast, I have to move slow. Have had issues with being unsteady for awhile, its nothing new.   PERTINENT HISTORY: See above  PAIN:  Are you having pain?  "Knees are hurting like crazy today but its OK"/no number given   PRECAUTIONS: Fall  RED FLAGS: None   WEIGHT BEARING RESTRICTIONS: No  FALLS:  Has patient fallen in last 6 months? Yes. Number of falls 1; tripped on box on the porch   LIVING ENVIRONMENT: Lives with: lives alone but son and daughter live nearby and take good care of her  Lives in: House/apartment Stairs: 4-5 STE with rails; level home once inside  Has following equipment at home: Single point cane, Environmental consultant - 2 wheeled, and Wheelchair (manual)  OCCUPATION: retired   PLOF: Independent, Independent with basic ADLs, Independent with gait, and Independent with  transfers  PATIENT GOALS: "I'm not sure I feel pretty ok as is"   NEXT MD VISIT: Referring 11/27/23   OBJECTIVE:  Note: Objective measures were completed at Evaluation unless otherwise noted.    PATIENT SURVEYS:  ABC scale 70%  COGNITION: Overall cognitive status: History of cognitive impairments - at baseline; 2/3 on 3 word recall test      SENSATION: Not tested     LOWER EXTREMITY MMT:  MMT Right eval Left eval  Hip flexion 3+ 3+  Hip extension    Hip abduction 3+ 3+  Hip adduction    Hip internal rotation    Hip external rotation    Knee flexion 5 5  Knee extension 5 5  Ankle dorsiflexion 5 5  Ankle plantarflexion    Ankle inversion    Ankle eversion     (Blank rows = not tested)    FUNCTIONAL TESTS:  5 times sit to stand: 16 seconds with BUEs due to posterior lean  Timed up and go (TUG): 14.5 seconds min guard/no device extended time to turn around cone  3 minute walk test: 374ft one seated rest break, increased WOB and unsteadiness dramatically increased along with fatigue   GAIT: Distance walked: in clinic distances  Assistive device utilized: None Level of assistance: SBA Comments: generally unsteady, drifting R/L and slow gait speed                                                                                                                                 TREATMENT DATE:   09/27/23  Nustep L5x6 minutes all four extremities for w/u and tissue perfusion Walking 151ft with 2# each LE x2 for improved functional activity tolerance  STS from low mat table x10 red TB above knees    LAQs #2 2x12 B Seated small SLRs 2# 2x10 B  Seated clams red TB 2x10 Seated marches red TB 2x10 alternating     09/24/23 Seated hip abd with ball  squeeze 5 sec holds, 10 x Leg press into compliant side of BOSU, 5 sec holds, 10 x 2 each leg Nustep L 5 Ue's and LE's 6  Standing for heel/ toe rocks B UE support Standing for alt toe taps to 8" step , B UE  support Seated long arc quads 2# 10 x 1 each Seated marching with 2 # cuff wts 1 x 10, attempted with alt punches but pt unable to coordinate Standing alt hip abd with 2 # cuff wts ankles 10 x each Standing alt hip ext with 2# cuff wts ankles 10 x each Walking indoors, 3 laps, 300' with SBA Sit to stand with B UE support 2 x 10  09/18/23:  Practiced home program with print outs from original PT visit: Red t band around knees for marching Red t band around knees for hip abd/ER Sit to stand 5 x ,2 sets Gait x 300', marked antalgia L LE on second lap, then SOB after 3rd lap, utilized inhaler Seated long arc quads 2# 10x  Standing hip abd 2# Standing hip ext 2# Seated hip abd with ball squeeze 5 sec holds, 10 x Leg press into compliant side of BOSU, 5 sec holds, 10 x each leg Nustep level 5, Ue's and LE's, 5 min     09/13/23  Eval, care planning, HEP   Marches red TB x10 B Seated hip ABD red TB x10 STS x5 no UEs       PATIENT EDUCATION:  Education details: exam findings, POC, HEP  Person educated: Patient Education method: Programmer, multimedia, Facilities manager, Verbal cues, and Handouts Education comprehension: verbalized understanding, returned demonstration, and needs further education  HOME EXERCISE PROGRAM: Access Code: [email or web address] URL: [email or web address] Date: 09/13/2023 Prepared by: Nedra Hai  Exercises - Seated March with Resistance  - 1 x daily - 7 x weekly - 2 sets - 10 reps - 2 seconds  hold - Seated Hip Abduction with Resistance  - 1 x daily - 7 x weekly - 2 sets - 10 reps - 2 seconds  hold - Sit to Stand with Arms Crossed  - 1 x daily - 7 x weekly - 2 sets - 5 reps  ASSESSMENT:  CLINICAL IMPRESSION:   Pt arrives today doing OK, very pleasantly confused today and needed frequent redirection today. Worked on functional activity tolerance, strength, and balance as able today. Took it easy and modified session as appropriate given high levels of knee  pain today.    OBJECTIVE IMPAIRMENTS: Abnormal gait, decreased activity tolerance, decreased balance, decreased cognition, decreased coordination, decreased mobility, difficulty walking, decreased strength, and decreased safety awareness.   ACTIVITY LIMITATIONS: standing, squatting, stairs, transfers, bathing, toileting, dressing, and locomotion level  PARTICIPATION LIMITATIONS: meal prep, cleaning, laundry, shopping, community activity, and yard work  PERSONAL FACTORS: Age, Behavior pattern, Education, Fitness, Past/current experiences, Social background, and Time since onset of injury/illness/exacerbation are also affecting patient's functional outcome.   REHAB POTENTIAL: Good  CLINICAL DECISION MAKING: Stable/uncomplicated  EVALUATION COMPLEXITY: Low   GOALS: Goals reviewed with patient? No  SHORT TERM GOALS: Target date: 10/11/2023   Will be compliant with appropriate progressive HEP  Baseline: Goal status: INITIAL    2.  Will be able to name 3 ways to prevent falls at home and in the community  Baseline:  Goal status: INITIAL  3.  Will complete TUG in 10 seconds or less with LRAD, minimal unsteadiness   Baseline:  Goal status: INITIAL    LONG TERM GOALS: Target date: 11/08/2023  MMT to have improved by at least one grade in all weak groups  Baseline:  Goal status: INITIAL  2.  Will complete 5xSTS test in 12 seconds or less to show improved mobility and strength, no posterior LOB  Baseline:  Goal status: INITIAL  3.  Will score at least 48 on Berg to show reduced fall risk  Baseline:  Goal status: INITIAL    4.  Will score 85% on ABC survey to show improved subjective status  Baseline:  Goal status: INITIAL  5. Will ambulate at least 444ft in without fatigue to improve community access  Baseline: Goal status: INITIAL      PLAN:  PT FREQUENCY: 1-2x/week  PT DURATION: 8 weeks  PLANNED INTERVENTIONS: 97110-Therapeutic exercises, 97530-  Therapeutic activity, [id number]- Neuromuscular re-education, 97535- Self Care, [id number]- Manual therapy, and [id number]- Gait training  PLAN FOR NEXT SESSION: balance, strength, general conditioning as tolerated, modifications as appropriate for knee pain   Nedra Hai, PT, DPT 09/27/23 1:43 PM

## 2023-10-02 ENCOUNTER — Encounter: Payer: Self-pay | Admitting: Physical Therapy

## 2023-10-02 ENCOUNTER — Ambulatory Visit: Payer: 59 | Admitting: Physical Therapy

## 2023-10-02 DIAGNOSIS — R2681 Unsteadiness on feet: Secondary | ICD-10-CM

## 2023-10-02 DIAGNOSIS — R262 Difficulty in walking, not elsewhere classified: Secondary | ICD-10-CM

## 2023-10-02 DIAGNOSIS — Z9181 History of falling: Secondary | ICD-10-CM | POA: Diagnosis not present

## 2023-10-02 DIAGNOSIS — M6281 Muscle weakness (generalized): Secondary | ICD-10-CM | POA: Diagnosis not present

## 2023-10-02 NOTE — Therapy (Signed)
 OUTPATIENT PHYSICAL THERAPY LOWER EXTREMITY TREATMENT    Patient Name: [name] MRN: [id number] DOB:1941-02-27, 83 y.o., female Today's Date: 10/02/2023  END OF SESSION:  PT End of Session - 10/02/23 1300     Visit Number 5    Number of Visits 17    Date for PT Re-Evaluation 11/08/23    Authorization Type [employer] Dual    Authorization Time Period 09/13/23 to 11/08/23    Progress Note Due on Visit 10    PT Start Time 1301    PT Stop Time 1341    PT Time Calculation (min) 40 min    Activity Tolerance Patient tolerated treatment well    Behavior During Therapy [facility] for tasks assessed/performed                Past Medical History:  Diagnosis Date   Acute metabolic encephalopathy 06/02/2019   Adenocarcinoma of right lung, stage 1 (HCC) 07/28/2016   Anxiety    Cancer (HCC)    uterine   Closed fracture of left distal radius 06/13/2020   COPD (chronic obstructive pulmonary disease) (HCC)    Coronary artery disease    mild nonobstructive by 2019 cath   Depression    GERD (gastroesophageal reflux disease)    Hyperlipidemia    Hypertension    Low back pain    Osteoarthritis    Osteoporosis    Takotsubo cardiomyopathy    EF recovered to 50-55% by 01/23/18 echo   Vitamin D insufficiency 06/13/2020   Past Surgical History:  Procedure Laterality Date   ANKLE SURGERY Right    horse accident   APPENDECTOMY     BACK SURGERY     CHOLECYSTECTOMY     EYE SURGERY     lense implant   FRACTURE SURGERY     HIP SURGERY  01/2010   Left Hip    INNER EAR SURGERY Bilateral 1970   related to severe ear infections   KNEE SURGERY     Right knee   LEFT HEART CATH AND CORONARY ANGIOGRAPHY N/A 12/11/2017   Procedure: LEFT HEART CATH AND CORONARY ANGIOGRAPHY;  Surgeon: Corky Crafts, MD;  Location: [facility];  Service: Cardiovascular;  Laterality: N/A;   LOBECTOMY Right 05/09/2016   Procedure: RIGHT UPPER LOBECTOMY;  Surgeon: Loreli Slot, MD;  Location: [facility]  OR;  Service: Thoracic;  Laterality: Right;   MASS EXCISION  08/25/2011   Procedure: EXCISION MASS;  Surgeon: Shelly Rubenstein, MD;  Location: [facility];  Service: General;  Laterality: N/A;  excision left neck mass   OPEN REDUCTION INTERNAL FIXATION (ORIF) DISTAL RADIAL FRACTURE Left 06/12/2020   Procedure: OPEN REDUCTION INTERNAL FIXATION (ORIF) DISTAL RADIAL FRACTURE;  Surgeon: Roby Lofts, MD;  Location: [facility] OR;  Service: Orthopedics;  Laterality: Left;   TOTAL ABDOMINAL HYSTERECTOMY     TOTAL KNEE ARTHROPLASTY Right 05/27/2019   Procedure: TOTAL KNEE ARTHROPLASTY;  Surgeon: Dannielle Huh, MD;  Location: [facility]S;  Service: Orthopedics;  Laterality: Right;  75 mins needed for length of case   TOTAL KNEE ARTHROPLASTY Left 12/30/2019   Procedure: TOTAL KNEE ARTHROPLASTY;  Surgeon: Dannielle Huh, MD;  Location: [facility];  Service: Orthopedics;  Laterality: Left;   VIDEO ASSISTED THORACOSCOPY (VATS)/WEDGE RESECTION Right 05/09/2016   Procedure: VIDEO ASSISTED THORACOSCOPY (VATS)/WEDGE RESECTION;  Surgeon: Loreli Slot, MD;  Location: [facility] OR;  Service: Thoracic;  Laterality: Right;   Patient Active Problem List   Diagnosis Date Noted   Concern about  memory 08/30/2023   OAB (overactive bladder) 08/30/2023   Gait abnormality 05/04/2023   Frequent falls 05/04/2023   Mass of left wrist 05/04/2023   Dizziness 04/05/2023   History of home oxygen therapy 04/05/2023   Urinary frequency 02/20/2023   Nocturnal enuresis 02/20/2023   Pneumonia due to COVID-19 virus 07/28/2021   Dyslipidemia 07/28/2021   Cystitis 05/11/2021   Sinus bradycardia 05/11/2021   Acute on chronic anemia 05/11/2021   Closed fracture of left distal radius 06/13/2020   Vitamin D insufficiency 06/13/2020   Greater trochanter fracture (HCC), left 06/11/2020   History of total knee replacement 06/11/2019   S/P total knee replacement 05/27/2019   DDD (degenerative disc disease), 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    Bilateral renal cysts 05/21/2018   Hiatal hernia 05/21/2018   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   Thoracic radiculitis (Bilateral) (L>R) 05/03/2018   Chronic thoracic back pain (Left) 05/03/2018   Injury of peripheral nerves of thorax, sequela 03/22/2018   Chest pain 03/21/2018   Rib pain (Left) 03/21/2018   Chronic rib pain (Right) 03/20/2018   DDD (degenerative disc disease), lumbar 03/20/2018   Chronic fracture of pubic ramus, sequela (Right) 03/20/2018   History of femoral neck fracture, sequela (Left) 03/20/2018    Class: History of   Lumbar facet arthropathy (Bilateral) 03/20/2018   Lumbar facet syndrome (Bilateral) 03/20/2018   Osteoarthritis of facet joint of lumbar spine 03/20/2018   Spondylosis without myelopathy or radiculopathy, lumbosacral region 03/20/2018   Other specified dorsopathies, sacral and sacrococcygeal region 03/20/2018   Chronic pain syndrome 02/26/2018   Cancer related pain 02/26/2018   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   Chronic low back pain (Secondary Area of Pain) (Bilateral) 02/26/2018   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   Chronic lower extremity pain (Tertiary Area of Pain) (Bilateral) (R>L) 02/26/2018   Chronic hip pain (Fourth Area of Pain) (Bilateral) (R>L) 02/26/2018   Neurogenic pain 02/26/2018   Pharmacologic therapy 02/26/2018   Problems influencing health status 02/26/2018   Long term prescription benzodiazepine use 02/26/2018   Long term prescription opiate use 02/26/2018   History of uterine cancer 12/08/2017   Generalized anxiety disorder 12/08/2017   GERD (gastroesophageal reflux disease) 12/08/2017   Substernal chest pain 12/08/2017   Chronic sacroiliac joint pain 06/21/2017   Disorder of skeletal system 06/21/2017   Other long term (current) drug therapy 06/21/2017   Other specified health status 06/21/2017   Chronic chest wall pain (Primary Area of Pain)  06/21/2017   Chronic post-thoracotomy pain 03/03/2017   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   Adenocarcinoma of right lung, stage 1 (HCC) 07/28/2016   S/P lobectomy of lung 05/09/2016   DVT (deep venous thrombosis) (HCC) 04/26/2016   Heart murmur    Essential hypertension 08/29/2015   Hyperlipidemia 08/29/2015   CKD (chronic kidney disease), stage III (HCC) - baseline SCr 1.3-1.5 08/29/2015   COPD (chronic obstructive pulmonary disease) (HCC) 04/27/2015   Infected sebaceous cyst 08/16/2011    PCP: Mordecai Maes MD   REFERRING PROVIDER: Eden Emms, NP  REFERRING DIAG: R29.6 (ICD-10-CM) - Frequent falls R42 (ICD-10-CM) - Dizziness  THERAPY DIAG:  Unsteadiness on feet  Muscle weakness (generalized)  History of falling  Difficulty in walking, not elsewhere classified  Rationale for Evaluation and Treatment: Rehabilitation  ONSET DATE: chronic   SUBJECTIVE:   SUBJECTIVE STATEMENT:  Knees are feeling a little better today, same old same old nothing    EVAL: Tripped over a box  that my son left on the porch, had a big bruise on my face for awhile. Get dizzy when I get up real fast, I have to move slow. Have had issues with being unsteady for awhile, its nothing new.   PERTINENT HISTORY: See above  PAIN:  Are you having pain?  0/10 now some back pain when sitting up sometimes   PRECAUTIONS: Fall  RED FLAGS: None   WEIGHT BEARING RESTRICTIONS: No  FALLS:  Has patient fallen in last 6 months? Yes. Number of falls 1; tripped on box on the porch   LIVING ENVIRONMENT: Lives with: lives alone but son and daughter live nearby and take good care of her  Lives in: House/apartment Stairs: 4-5 STE with rails; level home once inside  Has following equipment at home: Single point cane, Environmental consultant - 2 wheeled, and Wheelchair (manual)  OCCUPATION: retired   PLOF: Independent, Independent with basic ADLs, Independent with gait, and Independent with  transfers  PATIENT GOALS: "I'm not sure I feel pretty ok as is"   NEXT MD VISIT: Referring 11/27/23   OBJECTIVE:  Note: Objective measures were completed at Evaluation unless otherwise noted.    PATIENT SURVEYS:  ABC scale 70%  COGNITION: Overall cognitive status: History of cognitive impairments - at baseline; 2/3 on 3 word recall test      SENSATION: Not tested     LOWER EXTREMITY MMT:  MMT Right eval Left eval  Hip flexion 3+ 3+  Hip extension    Hip abduction 3+ 3+  Hip adduction    Hip internal rotation    Hip external rotation    Knee flexion 5 5  Knee extension 5 5  Ankle dorsiflexion 5 5  Ankle plantarflexion    Ankle inversion    Ankle eversion     (Blank rows = not tested)    FUNCTIONAL TESTS:  5 times sit to stand: 16 seconds with BUEs due to posterior lean  Timed up and go (TUG): 14.5 seconds min guard/no device extended time to turn around cone  3 minute walk test: 324ft one seated rest break, increased WOB and unsteadiness dramatically increased along with fatigue   GAIT: Distance walked: in clinic distances  Assistive device utilized: None Level of assistance: SBA Comments: generally unsteady, drifting R/L and slow gait speed                                                                                                                                 TREATMENT DATE:   10/02/23  Walking laps 175t with 2# on each leg + carrying yellow weighted ball, O2 92% HR 133bpm Nustep L5x8 minutes all four extremities  Attempted alternating toe taps on step- unable to follow cues/perform successfully due cognition Weaving between cones x2 rounds- often became distracted in middle of activity and walked past cones instead of between  Figure 8 walks x4 Mod multimodal cues  Scavenger hunt looking for  cones placed around clinic Mod cues   HR from 96-133bpm with functional activities today, rest breaks provided PRN (age predicted HR  140bpm)  09/27/23  Nustep L5x6 minutes all four extremities for w/u and tissue perfusion Walking 18ft with 2# each LE x2 for improved functional activity tolerance  STS from low mat table x10 red TB above knees    LAQs #2 2x12 B Seated small SLRs 2# 2x10 B  Seated clams red TB 2x10 Seated marches red TB 2x10 alternating          PATIENT EDUCATION:  Education details: exam findings, POC, HEP  Person educated: Patient Education method: Programmer, multimedia, Facilities manager, Verbal cues, and Handouts Education comprehension: verbalized understanding, returned demonstration, and needs further education  HOME EXERCISE PROGRAM: Access Code: [email or web address] URL: [email or web address] Date: 09/13/2023 Prepared by: Nedra Hai  Exercises - Seated March with Resistance  - 1 x daily - 7 x weekly - 2 sets - 10 reps - 2 seconds  hold - Seated Hip Abduction with Resistance  - 1 x daily - 7 x weekly - 2 sets - 10 reps - 2 seconds  hold - Sit to Stand with Arms Crossed  - 1 x daily - 7 x weekly - 2 sets - 5 reps  ASSESSMENT:  CLINICAL IMPRESSION:   Pt arrived today doing OK, we continued working on functional activities and cognition as time allowed. Needed a lot of cues to stay on task and to simply activities today, very easily distracted. Poor insight into deficits and fatigue levels. Did have significant HR increase with exercises but recovered as expected with rest breaks. More distractible than her usual today, perseverated on what other patients in the gym were doing and needed Max cues for redirection and focus on task at hand.    OBJECTIVE IMPAIRMENTS: Abnormal gait, decreased activity tolerance, decreased balance, decreased cognition, decreased coordination, decreased mobility, difficulty walking, decreased strength, and decreased safety awareness.   ACTIVITY LIMITATIONS: standing, squatting, stairs, transfers, bathing, toileting, dressing, and locomotion  level  PARTICIPATION LIMITATIONS: meal prep, cleaning, laundry, shopping, community activity, and yard work  PERSONAL FACTORS: Age, Behavior pattern, Education, Fitness, Past/current experiences, Social background, and Time since onset of injury/illness/exacerbation are also affecting patient's functional outcome.   REHAB POTENTIAL: Good  CLINICAL DECISION MAKING: Stable/uncomplicated  EVALUATION COMPLEXITY: Low   GOALS: Goals reviewed with patient? No  SHORT TERM GOALS: Target date: 10/11/2023   Will be compliant with appropriate progressive HEP  Baseline: Goal status: INITIAL    2.  Will be able to name 3 ways to prevent falls at home and in the community  Baseline:  Goal status: INITIAL  3.  Will complete TUG in 10 seconds or less with LRAD, minimal unsteadiness   Baseline:  Goal status: INITIAL    LONG TERM GOALS: Target date: 11/08/2023    MMT to have improved by at least one grade in all weak groups  Baseline:  Goal status: INITIAL  2.  Will complete 5xSTS test in 12 seconds or less to show improved mobility and strength, no posterior LOB  Baseline:  Goal status: INITIAL  3.  Will score at least 48 on Berg to show reduced fall risk  Baseline:  Goal status: INITIAL    4.  Will score 85% on ABC survey to show improved subjective status  Baseline:  Goal status: INITIAL  5. Will ambulate at least 480ft in without fatigue to improve community access  Baseline: Goal status: INITIAL  PLAN:  PT FREQUENCY: 1-2x/week  PT DURATION: 8 weeks  PLANNED INTERVENTIONS: 97110-Therapeutic exercises, 97530- Therapeutic activity, [id number]- Neuromuscular re-education, 97535- Self Care, [id number]- Manual therapy, and [id number]- Gait training  PLAN FOR NEXT SESSION: keep it simple and functional, balance, strength, general conditioning as tolerated, modifications as appropriate for knee pain, check HR PRN   Nedra Hai, PT, DPT 10/02/23 1:44 PM

## 2023-10-04 ENCOUNTER — Other Ambulatory Visit (INDEPENDENT_AMBULATORY_CARE_PROVIDER_SITE_OTHER): Payer: 59

## 2023-10-04 DIAGNOSIS — D509 Iron deficiency anemia, unspecified: Secondary | ICD-10-CM | POA: Diagnosis not present

## 2023-10-04 LAB — CBC
HCT: 33 % — ABNORMAL LOW (ref 36.0–46.0)
Hemoglobin: 10.5 g/dL — ABNORMAL LOW (ref 12.0–15.0)
MCHC: 31.7 g/dL (ref 30.0–36.0)
MCV: 76 fl — ABNORMAL LOW (ref 78.0–100.0)
Platelets: 453 10*3/uL — ABNORMAL HIGH (ref 150.0–400.0)
RBC: 4.34 Mil/uL (ref 3.87–5.11)
RDW: 19.3 % — ABNORMAL HIGH (ref 11.5–15.5)
WBC: 7.8 10*3/uL (ref 4.0–10.5)

## 2023-10-04 LAB — IBC + FERRITIN
Ferritin: 23.2 ng/mL (ref 10.0–291.0)
Iron: 23 ug/dL — ABNORMAL LOW (ref 42–145)
Saturation Ratios: 8 % — ABNORMAL LOW (ref 20.0–50.0)
TIBC: 287 ug/dL (ref 250.0–450.0)
Transferrin: 205 mg/dL — ABNORMAL LOW (ref 212.0–360.0)

## 2023-10-04 NOTE — Therapy (Signed)
 OUTPATIENT PHYSICAL THERAPY LOWER EXTREMITY TREATMENT    Patient Name: [name] MRN: [id number] DOB:18-Mar-1941, 83 y.o., female Today's Date: 10/05/2023  END OF SESSION:  PT End of Session - 10/05/23 1056     Visit Number 6    Number of Visits 17    Date for PT Re-Evaluation 11/08/23    Authorization Type [employer] Dual    Authorization Time Period 09/13/23 to 11/08/23    Progress Note Due on Visit 10    PT Start Time 1100    PT Stop Time 1145    PT Time Calculation (min) 45 min    Activity Tolerance Patient tolerated treatment well    Behavior During Therapy [facility] for tasks assessed/performed                 Past Medical History:  Diagnosis Date   Acute metabolic encephalopathy 06/02/2019   Adenocarcinoma of right lung, stage 1 (HCC) 07/28/2016   Anxiety    Cancer (HCC)    uterine   Closed fracture of left distal radius 06/13/2020   COPD (chronic obstructive pulmonary disease) (HCC)    Coronary artery disease    mild nonobstructive by 2019 cath   Depression    GERD (gastroesophageal reflux disease)    Hyperlipidemia    Hypertension    Low back pain    Osteoarthritis    Osteoporosis    Takotsubo cardiomyopathy    EF recovered to 50-55% by 01/23/18 echo   Vitamin D insufficiency 06/13/2020   Past Surgical History:  Procedure Laterality Date   ANKLE SURGERY Right    horse accident   APPENDECTOMY     BACK SURGERY     CHOLECYSTECTOMY     EYE SURGERY     lense implant   FRACTURE SURGERY     HIP SURGERY  01/2010   Left Hip    INNER EAR SURGERY Bilateral 1970   related to severe ear infections   KNEE SURGERY     Right knee   LEFT HEART CATH AND CORONARY ANGIOGRAPHY N/A 12/11/2017   Procedure: LEFT HEART CATH AND CORONARY ANGIOGRAPHY;  Surgeon: Corky Crafts, MD;  Location: [facility];  Service: Cardiovascular;  Laterality: N/A;   LOBECTOMY Right 05/09/2016   Procedure: RIGHT UPPER LOBECTOMY;  Surgeon: Loreli Slot, MD;  Location: [facility]  OR;  Service: Thoracic;  Laterality: Right;   MASS EXCISION  08/25/2011   Procedure: EXCISION MASS;  Surgeon: Shelly Rubenstein, MD;  Location: [facility];  Service: General;  Laterality: N/A;  excision left neck mass   OPEN REDUCTION INTERNAL FIXATION (ORIF) DISTAL RADIAL FRACTURE Left 06/12/2020   Procedure: OPEN REDUCTION INTERNAL FIXATION (ORIF) DISTAL RADIAL FRACTURE;  Surgeon: Roby Lofts, MD;  Location: [facility] OR;  Service: Orthopedics;  Laterality: Left;   TOTAL ABDOMINAL HYSTERECTOMY     TOTAL KNEE ARTHROPLASTY Right 05/27/2019   Procedure: TOTAL KNEE ARTHROPLASTY;  Surgeon: Dannielle Huh, MD;  Location: [facility] ORS;  Service: Orthopedics;  Laterality: Right;  75 mins needed for length of case   TOTAL KNEE ARTHROPLASTY Left 12/30/2019   Procedure: TOTAL KNEE ARTHROPLASTY;  Surgeon: Dannielle Huh, MD;  Location: [facility];  Service: Orthopedics;  Laterality: Left;   VIDEO ASSISTED THORACOSCOPY (VATS)/WEDGE RESECTION Right 05/09/2016   Procedure: VIDEO ASSISTED THORACOSCOPY (VATS)/WEDGE RESECTION;  Surgeon: Loreli Slot, MD;  Location: [facility] OR;  Service: Thoracic;  Laterality: Right;   Patient Active Problem List   Diagnosis Date Noted   Concern  about memory 08/30/2023   OAB (overactive bladder) 08/30/2023   Gait abnormality 05/04/2023   Frequent falls 05/04/2023   Mass of left wrist 05/04/2023   Dizziness 04/05/2023   History of home oxygen therapy 04/05/2023   Urinary frequency 02/20/2023   Nocturnal enuresis 02/20/2023   Pneumonia due to COVID-19 virus 07/28/2021   Dyslipidemia 07/28/2021   Cystitis 05/11/2021   Sinus bradycardia 05/11/2021   Acute on chronic anemia 05/11/2021   Closed fracture of left distal radius 06/13/2020   Vitamin D insufficiency 06/13/2020   Greater trochanter fracture (HCC), left 06/11/2020   History of total knee replacement 06/11/2019   S/P total knee replacement 05/27/2019   DDD (degenerative disc disease), 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    Bilateral renal cysts 05/21/2018   Hiatal hernia 05/21/2018   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   Thoracic radiculitis (Bilateral) (L>R) 05/03/2018   Chronic thoracic back pain (Left) 05/03/2018   Injury of peripheral nerves of thorax, sequela 03/22/2018   Chest pain 03/21/2018   Rib pain (Left) 03/21/2018   Chronic rib pain (Right) 03/20/2018   DDD (degenerative disc disease), lumbar 03/20/2018   Chronic fracture of pubic ramus, sequela (Right) 03/20/2018   History of femoral neck fracture, sequela (Left) 03/20/2018    Class: History of   Lumbar facet arthropathy (Bilateral) 03/20/2018   Lumbar facet syndrome (Bilateral) 03/20/2018   Osteoarthritis of facet joint of lumbar spine 03/20/2018   Spondylosis without myelopathy or radiculopathy, lumbosacral region 03/20/2018   Other specified dorsopathies, sacral and sacrococcygeal region 03/20/2018   Chronic pain syndrome 02/26/2018   Cancer related pain 02/26/2018   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   Chronic low back pain (Secondary Area of Pain) (Bilateral) 02/26/2018   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   Chronic lower extremity pain (Tertiary Area of Pain) (Bilateral) (R>L) 02/26/2018   Chronic hip pain (Fourth Area of Pain) (Bilateral) (R>L) 02/26/2018   Neurogenic pain 02/26/2018   Pharmacologic therapy 02/26/2018   Problems influencing health status 02/26/2018   Long term prescription benzodiazepine use 02/26/2018   Long term prescription opiate use 02/26/2018   History of uterine cancer 12/08/2017   Generalized anxiety disorder 12/08/2017   GERD (gastroesophageal reflux disease) 12/08/2017   Substernal chest pain 12/08/2017   Chronic sacroiliac joint pain 06/21/2017   Disorder of skeletal system 06/21/2017   Other long term (current) drug therapy 06/21/2017   Other specified health status 06/21/2017   Chronic chest wall pain (Primary Area of Pain)  06/21/2017   Chronic post-thoracotomy pain 03/03/2017   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   Adenocarcinoma of right lung, stage 1 (HCC) 07/28/2016   S/P lobectomy of lung 05/09/2016   DVT (deep venous thrombosis) (HCC) 04/26/2016   Heart murmur    Essential hypertension 08/29/2015   Hyperlipidemia 08/29/2015   CKD (chronic kidney disease), stage III (HCC) - baseline SCr 1.3-1.5 08/29/2015   COPD (chronic obstructive pulmonary disease) (HCC) 04/27/2015   Infected sebaceous cyst 08/16/2011    PCP: Mordecai Maes MD   REFERRING PROVIDER: Eden Emms, NP  REFERRING DIAG: R29.6 (ICD-10-CM) - Frequent falls R42 (ICD-10-CM) - Dizziness  THERAPY DIAG:  Unsteadiness on feet  Muscle weakness (generalized)  History of falling  Difficulty in walking, not elsewhere classified  Rationale for Evaluation and Treatment: Rehabilitation  ONSET DATE: chronic   SUBJECTIVE:   SUBJECTIVE STATEMENT:  If I move too quick I get dizzy.    EVAL: Tripped over a box that my son  left on the porch, had a big bruise on my face for awhile. Get dizzy when I get up real fast, I have to move slow. Have had issues with being unsteady for awhile, its nothing new.   PERTINENT HISTORY: See above  PAIN:  Are you having pain?  0/10 now some back pain when sitting up sometimes   PRECAUTIONS: Fall  RED FLAGS: None   WEIGHT BEARING RESTRICTIONS: No  FALLS:  Has patient fallen in last 6 months? Yes. Number of falls 1; tripped on box on the porch   LIVING ENVIRONMENT: Lives with: lives alone but son and daughter live nearby and take good care of her  Lives in: House/apartment Stairs: 4-5 STE with rails; level home once inside  Has following equipment at home: Single point cane, Environmental consultant - 2 wheeled, and Wheelchair (manual)  OCCUPATION: retired   PLOF: Independent, Independent with basic ADLs, Independent with gait, and Independent with transfers  PATIENT GOALS: "I'm not sure I  feel pretty ok as is"   NEXT MD VISIT: Referring 11/27/23   OBJECTIVE:  Note: Objective measures were completed at Evaluation unless otherwise noted.    PATIENT SURVEYS:  ABC scale 70%  COGNITION: Overall cognitive status: History of cognitive impairments - at baseline; 2/3 on 3 word recall test      SENSATION: Not tested     LOWER EXTREMITY MMT:  MMT Right eval Left eval  Hip flexion 3+ 3+  Hip extension    Hip abduction 3+ 3+  Hip adduction    Hip internal rotation    Hip external rotation    Knee flexion 5 5  Knee extension 5 5  Ankle dorsiflexion 5 5  Ankle plantarflexion    Ankle inversion    Ankle eversion     (Blank rows = not tested)    FUNCTIONAL TESTS:  5 times sit to stand: 16 seconds with BUEs due to posterior lean  Timed up and go (TUG): 14.5 seconds min guard/no device extended time to turn around cone  3 minute walk test: 394ft one seated rest break, increased WOB and unsteadiness dramatically increased along with fatigue   GAIT: Distance walked: in clinic distances  Assistive device utilized: None Level of assistance: SBA Comments: generally unsteady, drifting R/L and slow gait speed                                                                                                                                 TREATMENT DATE:  10/05/23 In parallel bars  Standing marches Standing hip abd  Cone taps  Ball squeeze 2x10 Hip abd red 2x10 Seated march red band 2x10 HS curls red 2x10  LAQ 2# 2x10 Stepping over obstacles forwards and side steps    10/02/23 Walking laps 175t with 2# on each leg + carrying yellow weighted ball, O2 92% HR 133bpm Nustep L5x8 minutes all four extremities  Attempted alternating toe taps on step-  unable to follow cues/perform successfully due cognition Weaving between cones x2 rounds- often became distracted in middle of activity and walked past cones instead of between  Figure 8 walks x4 Mod multimodal cues   Scavenger hunt looking for cones placed around clinic Mod cues   HR from 96-133bpm with functional activities today, rest breaks provided PRN (age predicted HR 140bpm)  09/27/23  Nustep L5x6 minutes all four extremities for w/u and tissue perfusion Walking 112ft with 2# each LE x2 for improved functional activity tolerance  STS from low mat table x10 red TB above knees   LAQs #2 2x12 B Seated small SLRs 2# 2x10 B  Seated clams red TB 2x10 Seated marches red TB 2x10 alternating      PATIENT EDUCATION:  Education details: exam findings, POC, HEP  Person educated: Patient Education method: Programmer, multimedia, Facilities manager, Verbal cues, and Handouts Education comprehension: verbalized understanding, returned demonstration, and needs further education  HOME EXERCISE PROGRAM: Access Code: [email or web address] URL: [email or web address] Date: 09/13/2023 Prepared by: Nedra Hai  Exercises - Seated March with Resistance  - 1 x daily - 7 x weekly - 2 sets - 10 reps - 2 seconds  hold - Seated Hip Abduction with Resistance  - 1 x daily - 7 x weekly - 2 sets - 10 reps - 2 seconds  hold - Sit to Stand with Arms Crossed  - 1 x daily - 7 x weekly - 2 sets - 5 reps  ASSESSMENT:  CLINICAL IMPRESSION:  Pt arrived today doing OK, we continued working on functional activities and cognition as time allowed. Needed a lot of cues to stay on task and to simply activities today, very easily distracted. Poor insight into deficits and fatigue levels. If she moves too quick she gets dizzy. Lots of cues needed with interventions especially with side stepping and stepping over. Has difficulty following multi step commands, needs lots of direction and demonstrating to copy.    OBJECTIVE IMPAIRMENTS: Abnormal gait, decreased activity tolerance, decreased balance, decreased cognition, decreased coordination, decreased mobility, difficulty walking, decreased strength, and decreased safety awareness.    ACTIVITY LIMITATIONS: standing, squatting, stairs, transfers, bathing, toileting, dressing, and locomotion level  PARTICIPATION LIMITATIONS: meal prep, cleaning, laundry, shopping, community activity, and yard work  PERSONAL FACTORS: Age, Behavior pattern, Education, Fitness, Past/current experiences, Social background, and Time since onset of injury/illness/exacerbation are also affecting patient's functional outcome.   REHAB POTENTIAL: Good  CLINICAL DECISION MAKING: Stable/uncomplicated  EVALUATION COMPLEXITY: Low   GOALS: Goals reviewed with patient? No  SHORT TERM GOALS: Target date: 10/11/2023   Will be compliant with appropriate progressive HEP  Baseline: Goal status: INITIAL    2.  Will be able to name 3 ways to prevent falls at home and in the community  Baseline:  Goal status: INITIAL  3.  Will complete TUG in 10 seconds or less with LRAD, minimal unsteadiness   Baseline:  Goal status: INITIAL    LONG TERM GOALS: Target date: 11/08/2023    MMT to have improved by at least one grade in all weak groups  Baseline:  Goal status: INITIAL  2.  Will complete 5xSTS test in 12 seconds or less to show improved mobility and strength, no posterior LOB  Baseline:  Goal status: INITIAL  3.  Will score at least 48 on Berg to show reduced fall risk  Baseline:  Goal status: INITIAL    4.  Will score 85% on ABC survey to show improved subjective status  Baseline:  Goal  status: INITIAL  5. Will ambulate at least 465ft in without fatigue to improve community access  Baseline: Goal status: INITIAL      PLAN:  PT FREQUENCY: 1-2x/week  PT DURATION: 8 weeks  PLANNED INTERVENTIONS: 97110-Therapeutic exercises, 97530- Therapeutic activity, 97112- Neuromuscular re-education, 97535- Self Care, 91478- Manual therapy, and [id number]- Gait training  PLAN FOR NEXT SESSION: keep it simple and functional, balance, strength, general conditioning as tolerated,  modifications as appropriate for knee pain, check HR PRN   Nedra Hai, PT, DPT 10/05/23 11:43 AM

## 2023-10-05 ENCOUNTER — Other Ambulatory Visit (INDEPENDENT_AMBULATORY_CARE_PROVIDER_SITE_OTHER): Payer: Self-pay

## 2023-10-05 ENCOUNTER — Ambulatory Visit: Payer: 59

## 2023-10-05 DIAGNOSIS — R710 Precipitous drop in hematocrit: Secondary | ICD-10-CM

## 2023-10-05 DIAGNOSIS — R262 Difficulty in walking, not elsewhere classified: Secondary | ICD-10-CM | POA: Diagnosis not present

## 2023-10-05 DIAGNOSIS — M6281 Muscle weakness (generalized): Secondary | ICD-10-CM

## 2023-10-05 DIAGNOSIS — Z9181 History of falling: Secondary | ICD-10-CM | POA: Diagnosis not present

## 2023-10-05 DIAGNOSIS — R2681 Unsteadiness on feet: Secondary | ICD-10-CM | POA: Diagnosis not present

## 2023-10-05 LAB — FECAL OCCULT BLOOD, IMMUNOCHEMICAL: Fecal Occult Bld: POSITIVE — AB

## 2023-10-09 ENCOUNTER — Ambulatory Visit: Payer: 59 | Admitting: Physical Therapy

## 2023-10-09 ENCOUNTER — Encounter: Payer: Self-pay | Admitting: Physical Therapy

## 2023-10-09 DIAGNOSIS — Z9181 History of falling: Secondary | ICD-10-CM | POA: Diagnosis not present

## 2023-10-09 DIAGNOSIS — R262 Difficulty in walking, not elsewhere classified: Secondary | ICD-10-CM

## 2023-10-09 DIAGNOSIS — R2681 Unsteadiness on feet: Secondary | ICD-10-CM

## 2023-10-09 DIAGNOSIS — M6281 Muscle weakness (generalized): Secondary | ICD-10-CM | POA: Diagnosis not present

## 2023-10-09 NOTE — Therapy (Signed)
 OUTPATIENT PHYSICAL THERAPY LOWER EXTREMITY TREATMENT    Patient Name: [name] MRN: [id number] DOB:08-Feb-1941, 83 y.o., female Today's Date: 10/09/2023  END OF SESSION:  PT End of Session - 10/09/23 1101     Visit Number 7    Number of Visits 17    Date for PT Re-Evaluation 11/08/23    Authorization Type [employer] Dual    Authorization Time Period 09/13/23 to 11/08/23    Progress Note Due on Visit 10    PT Start Time 1101    PT Stop Time 1142    PT Time Calculation (min) 41 min    Activity Tolerance Patient tolerated treatment well    Behavior During Therapy [facility] for tasks assessed/performed                  Past Medical History:  Diagnosis Date   Acute metabolic encephalopathy 06/02/2019   Adenocarcinoma of right lung, stage 1 (HCC) 07/28/2016   Anxiety    Cancer (HCC)    uterine   Closed fracture of left distal radius 06/13/2020   COPD (chronic obstructive pulmonary disease) (HCC)    Coronary artery disease    mild nonobstructive by 2019 cath   Depression    GERD (gastroesophageal reflux disease)    Hyperlipidemia    Hypertension    Low back pain    Osteoarthritis    Osteoporosis    Takotsubo cardiomyopathy    EF recovered to 50-55% by 01/23/18 echo   Vitamin D insufficiency 06/13/2020   Past Surgical History:  Procedure Laterality Date   ANKLE SURGERY Right    horse accident   APPENDECTOMY     BACK SURGERY     CHOLECYSTECTOMY     EYE SURGERY     lense implant   FRACTURE SURGERY     HIP SURGERY  01/2010   Left Hip    INNER EAR SURGERY Bilateral 1970   related to severe ear infections   KNEE SURGERY     Right knee   LEFT HEART CATH AND CORONARY ANGIOGRAPHY N/A 12/11/2017   Procedure: LEFT HEART CATH AND CORONARY ANGIOGRAPHY;  Surgeon: Corky Crafts, MD;  Location: [facility];  Service: Cardiovascular;  Laterality: N/A;   LOBECTOMY Right 05/09/2016   Procedure: RIGHT UPPER LOBECTOMY;  Surgeon: Loreli Slot, MD;  Location:  [facility] OR;  Service: Thoracic;  Laterality: Right;   MASS EXCISION  08/25/2011   Procedure: EXCISION MASS;  Surgeon: Shelly Rubenstein, MD;  Location: [facility];  Service: General;  Laterality: N/A;  excision left neck mass   OPEN REDUCTION INTERNAL FIXATION (ORIF) DISTAL RADIAL FRACTURE Left 06/12/2020   Procedure: OPEN REDUCTION INTERNAL FIXATION (ORIF) DISTAL RADIAL FRACTURE;  Surgeon: Roby Lofts, MD;  Location: [facility] OR;  Service: Orthopedics;  Laterality: Left;   TOTAL ABDOMINAL HYSTERECTOMY     TOTAL KNEE ARTHROPLASTY Right 05/27/2019   Procedure: TOTAL KNEE ARTHROPLASTY;  Surgeon: Dannielle Huh, MD;  Location: [facility] ORS;  Service: Orthopedics;  Laterality: Right;  75 mins needed for length of case   TOTAL KNEE ARTHROPLASTY Left 12/30/2019   Procedure: TOTAL KNEE ARTHROPLASTY;  Surgeon: Dannielle Huh, MD;  Location: [facility] ORS;  Service: Orthopedics;  Laterality: Left;   VIDEO ASSISTED THORACOSCOPY (VATS)/WEDGE RESECTION Right 05/09/2016   Procedure: VIDEO ASSISTED THORACOSCOPY (VATS)/WEDGE RESECTION;  Surgeon: Loreli Slot, MD;  Location: [facility] OR;  Service: Thoracic;  Laterality: Right;   Patient Active Problem List   Diagnosis Date Noted  Concern about memory 08/30/2023   OAB (overactive bladder) 08/30/2023   Gait abnormality 05/04/2023   Frequent falls 05/04/2023   Mass of left wrist 05/04/2023   Dizziness 04/05/2023   History of home oxygen therapy 04/05/2023   Urinary frequency 02/20/2023   Nocturnal enuresis 02/20/2023   Pneumonia due to COVID-19 virus 07/28/2021   Dyslipidemia 07/28/2021   Cystitis 05/11/2021   Sinus bradycardia 05/11/2021   Acute on chronic anemia 05/11/2021   Closed fracture of left distal radius 06/13/2020   Vitamin D insufficiency 06/13/2020   Greater trochanter fracture (HCC), left 06/11/2020   History of total knee replacement 06/11/2019   S/P total knee replacement 05/27/2019   DDD (degenerative disc disease), 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    Bilateral renal cysts 05/21/2018   Hiatal hernia 05/21/2018   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   Thoracic radiculitis (Bilateral) (L>R) 05/03/2018   Chronic thoracic back pain (Left) 05/03/2018   Injury of peripheral nerves of thorax, sequela 03/22/2018   Chest pain 03/21/2018   Rib pain (Left) 03/21/2018   Chronic rib pain (Right) 03/20/2018   DDD (degenerative disc disease), lumbar 03/20/2018   Chronic fracture of pubic ramus, sequela (Right) 03/20/2018   History of femoral neck fracture, sequela (Left) 03/20/2018    Class: History of   Lumbar facet arthropathy (Bilateral) 03/20/2018   Lumbar facet syndrome (Bilateral) 03/20/2018   Osteoarthritis of facet joint of lumbar spine 03/20/2018   Spondylosis without myelopathy or radiculopathy, lumbosacral region 03/20/2018   Other specified dorsopathies, sacral and sacrococcygeal region 03/20/2018   Chronic pain syndrome 02/26/2018   Cancer related pain 02/26/2018   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   Chronic low back pain (Secondary Area of Pain) (Bilateral) 02/26/2018   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   Chronic lower extremity pain (Tertiary Area of Pain) (Bilateral) (R>L) 02/26/2018   Chronic hip pain (Fourth Area of Pain) (Bilateral) (R>L) 02/26/2018   Neurogenic pain 02/26/2018   Pharmacologic therapy 02/26/2018   Problems influencing health status 02/26/2018   Long term prescription benzodiazepine use 02/26/2018   Long term prescription opiate use 02/26/2018   History of uterine cancer 12/08/2017   Generalized anxiety disorder 12/08/2017   GERD (gastroesophageal reflux disease) 12/08/2017   Substernal chest pain 12/08/2017   Chronic sacroiliac joint pain 06/21/2017   Disorder of skeletal system 06/21/2017   Other long term (current) drug therapy 06/21/2017   Other specified health status 06/21/2017   Chronic chest wall pain (Primary Area of Pain)  06/21/2017   Chronic post-thoracotomy pain 03/03/2017   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   Adenocarcinoma of right lung, stage 1 (HCC) 07/28/2016   S/P lobectomy of lung 05/09/2016   DVT (deep venous thrombosis) (HCC) 04/26/2016   Heart murmur    Essential hypertension 08/29/2015   Hyperlipidemia 08/29/2015   CKD (chronic kidney disease), stage III (HCC) - baseline SCr 1.3-1.5 08/29/2015   COPD (chronic obstructive pulmonary disease) (HCC) 04/27/2015   Infected sebaceous cyst 08/16/2011    PCP: Mordecai Maes MD   REFERRING PROVIDER: Eden Emms, NP  REFERRING DIAG: R29.6 (ICD-10-CM) - Frequent falls R42 (ICD-10-CM) - Dizziness  THERAPY DIAG:  Unsteadiness on feet  Muscle weakness (generalized)  History of falling  Difficulty in walking, not elsewhere classified  Rationale for Evaluation and Treatment: Rehabilitation  ONSET DATE: chronic   SUBJECTIVE:   SUBJECTIVE STATEMENT:  Feeling just fine today, walking better thanks to you ladies. Breathing better, I have my inhaler with me  EVAL: Tripped over a box that my son left on the porch, had a big bruise on my face for awhile. Get dizzy when I get up real fast, I have to move slow. Have had issues with being unsteady for awhile, its nothing new.   PERTINENT HISTORY: See above  PAIN:  Are you having pain?  0/10 now   PRECAUTIONS: Fall  RED FLAGS: None   WEIGHT BEARING RESTRICTIONS: No  FALLS:  Has patient fallen in last 6 months? Yes. Number of falls 1; tripped on box on the porch   LIVING ENVIRONMENT: Lives with: lives alone but son and daughter live nearby and take good care of her  Lives in: House/apartment Stairs: 4-5 STE with rails; level home once inside  Has following equipment at home: Single point cane, Environmental consultant - 2 wheeled, and Wheelchair (manual)  OCCUPATION: retired   PLOF: Independent, Independent with basic ADLs, Independent with gait, and Independent with  transfers  PATIENT GOALS: "I'm not sure I feel pretty ok as is"   NEXT MD VISIT: Referring 11/27/23   OBJECTIVE:  Note: Objective measures were completed at Evaluation unless otherwise noted.    PATIENT SURVEYS:  ABC scale 70%  COGNITION: Overall cognitive status: History of cognitive impairments - at baseline; 2/3 on 3 word recall test      SENSATION: Not tested     LOWER EXTREMITY MMT:  MMT Right eval Left eval  Hip flexion 3+ 3+  Hip extension    Hip abduction 3+ 3+  Hip adduction    Hip internal rotation    Hip external rotation    Knee flexion 5 5  Knee extension 5 5  Ankle dorsiflexion 5 5  Ankle plantarflexion    Ankle inversion    Ankle eversion     (Blank rows = not tested)    FUNCTIONAL TESTS:  5 times sit to stand: 16 seconds with BUEs due to posterior lean  Timed up and go (TUG): 14.5 seconds min guard/no device extended time to turn around cone  3 minute walk test: 357ft one seated rest break, increased WOB and unsteadiness dramatically increased along with fatigue   GAIT: Distance walked: in clinic distances  Assistive device utilized: None Level of assistance: SBA Comments: generally unsteady, drifting R/L and slow gait speed                                                                                                                                 TREATMENT DATE:   10/09/23  Alternating toe taps on 4 inch step x20 Mod cues and redirection Forward stepping over half foam rolls x4 rounds in // bars Side stepping over half foam rolls x2 rounds in // bars Nustep L5x8 minutes all four extremities   Walking with 2# each LE + carrying 2.2kg ball 157ftx2      10/05/23 In parallel bars  Standing marches Standing hip abd  Cone taps  Ball squeeze 2x10 Hip abd red 2x10 Seated march red band 2x10 HS curls red 2x10  LAQ 2# 2x10 Stepping over obstacles forwards and side steps         PATIENT EDUCATION:  Education details: exam  findings, POC, HEP  Person educated: Patient Education method: Programmer, multimedia, Facilities manager, Verbal cues, and Handouts Education comprehension: verbalized understanding, returned demonstration, and needs further education  HOME EXERCISE PROGRAM: Access Code: [email or web address] URL: [email or web address] Date: 09/13/2023 Prepared by: Nedra Hai  Exercises - Seated March with Resistance  - 1 x daily - 7 x weekly - 2 sets - 10 reps - 2 seconds  hold - Seated Hip Abduction with Resistance  - 1 x daily - 7 x weekly - 2 sets - 10 reps - 2 seconds  hold - Sit to Stand with Arms Crossed  - 1 x daily - 7 x weekly - 2 sets - 5 reps  ASSESSMENT:  CLINICAL IMPRESSION:  Pt arrives today doing OK, she feels she is moving a little more steadily. Continued working on functional activity, strength, balance as tolerated. Will plan on a brief check progress check up next visit. Still very easily distracted and needed frequent redirection, we worked in as quiet of a corner as we could find to minimize distractions.    OBJECTIVE IMPAIRMENTS: Abnormal gait, decreased activity tolerance, decreased balance, decreased cognition, decreased coordination, decreased mobility, difficulty walking, decreased strength, and decreased safety awareness.   ACTIVITY LIMITATIONS: standing, squatting, stairs, transfers, bathing, toileting, dressing, and locomotion level  PARTICIPATION LIMITATIONS: meal prep, cleaning, laundry, shopping, community activity, and yard work  PERSONAL FACTORS: Age, Behavior pattern, Education, Fitness, Past/current experiences, Social background, and Time since onset of injury/illness/exacerbation are also affecting patient's functional outcome.   REHAB POTENTIAL: Good  CLINICAL DECISION MAKING: Stable/uncomplicated  EVALUATION COMPLEXITY: Low   GOALS: Goals reviewed with patient? No  SHORT TERM GOALS: Target date: 10/11/2023   Will be compliant with appropriate progressive HEP   Baseline: Goal status: INITIAL    2.  Will be able to name 3 ways to prevent falls at home and in the community  Baseline:  Goal status: INITIAL  3.  Will complete TUG in 10 seconds or less with LRAD, minimal unsteadiness   Baseline:  Goal status: INITIAL    LONG TERM GOALS: Target date: 11/08/2023    MMT to have improved by at least one grade in all weak groups  Baseline:  Goal status: INITIAL  2.  Will complete 5xSTS test in 12 seconds or less to show improved mobility and strength, no posterior LOB  Baseline:  Goal status: INITIAL  3.  Will score at least 48 on Berg to show reduced fall risk  Baseline:  Goal status: INITIAL    4.  Will score 85% on ABC survey to show improved subjective status  Baseline:  Goal status: INITIAL  5. Will ambulate at least 453ft in without fatigue to improve community access  Baseline: Goal status: INITIAL      PLAN:  PT FREQUENCY: 1-2x/week  PT DURATION: 8 weeks  PLANNED INTERVENTIONS: 97110-Therapeutic exercises, 97530- Therapeutic activity, 97112- Neuromuscular re-education, 97535- Self Care, [id number]- Manual therapy, and [id number]- Gait training  PLAN FOR NEXT SESSION: keep it simple and functional, balance, strength, general conditioning as tolerated, modifications as appropriate for knee pain, check HR PRN. Check STGs next visit   Nedra Hai, PT, DPT 10/09/23 11:43 AM

## 2023-10-11 ENCOUNTER — Encounter: Payer: Self-pay | Admitting: Physical Therapy

## 2023-10-11 ENCOUNTER — Ambulatory Visit: Payer: 59 | Admitting: Physical Therapy

## 2023-10-11 DIAGNOSIS — R262 Difficulty in walking, not elsewhere classified: Secondary | ICD-10-CM | POA: Diagnosis not present

## 2023-10-11 DIAGNOSIS — M6281 Muscle weakness (generalized): Secondary | ICD-10-CM

## 2023-10-11 DIAGNOSIS — R2681 Unsteadiness on feet: Secondary | ICD-10-CM

## 2023-10-11 DIAGNOSIS — Z9181 History of falling: Secondary | ICD-10-CM

## 2023-10-11 NOTE — Therapy (Signed)
 OUTPATIENT PHYSICAL THERAPY LOWER EXTREMITY TREATMENT    Patient Name: [name] MRN: [id number] DOB:08-Feb-1941, 83 y.o., female Today's Date: 10/11/2023  END OF SESSION:  PT End of Session - 10/11/23 1106     Visit Number 8    Number of Visits 17    Date for PT Re-Evaluation 11/08/23    Authorization Type [employer] Dual    Authorization Time Period 09/13/23 to 11/08/23    Progress Note Due on Visit 10    PT Start Time 1103    PT Stop Time 1142    PT Time Calculation (min) 39 min    Activity Tolerance Patient tolerated treatment well    Behavior During Therapy [facility] for tasks assessed/performed                   Past Medical History:  Diagnosis Date   Acute metabolic encephalopathy 06/02/2019   Adenocarcinoma of right lung, stage 1 (HCC) 07/28/2016   Anxiety    Cancer (HCC)    uterine   Closed fracture of left distal radius 06/13/2020   COPD (chronic obstructive pulmonary disease) (HCC)    Coronary artery disease    mild nonobstructive by 2019 cath   Depression    GERD (gastroesophageal reflux disease)    Hyperlipidemia    Hypertension    Low back pain    Osteoarthritis    Osteoporosis    Takotsubo cardiomyopathy    EF recovered to 50-55% by 01/23/18 echo   Vitamin D insufficiency 06/13/2020   Past Surgical History:  Procedure Laterality Date   ANKLE SURGERY Right    horse accident   APPENDECTOMY     BACK SURGERY     CHOLECYSTECTOMY     EYE SURGERY     lense implant   FRACTURE SURGERY     HIP SURGERY  01/2010   Left Hip    INNER EAR SURGERY Bilateral 1970   related to severe ear infections   KNEE SURGERY     Right knee   LEFT HEART CATH AND CORONARY ANGIOGRAPHY N/A 12/11/2017   Procedure: LEFT HEART CATH AND CORONARY ANGIOGRAPHY;  Surgeon: Corky Crafts, MD;  [facility]: [facility];  Service: Cardiovascular;  Laterality: N/A;   LOBECTOMY Right 05/09/2016   Procedure: RIGHT UPPER LOBECTOMY;  Surgeon: Loreli Slot, MD;  Location:  [facility] OR;  Service: Thoracic;  Laterality: Right;   MASS EXCISION  08/25/2011   Procedure: EXCISION MASS;  Surgeon: Shelly Rubenstein, MD;  Location: [facility];  Service: General;  Laterality: N/A;  excision left neck mass   OPEN REDUCTION INTERNAL FIXATION (ORIF) DISTAL RADIAL FRACTURE Left 06/12/2020   Procedure: OPEN REDUCTION INTERNAL FIXATION (ORIF) DISTAL RADIAL FRACTURE;  Surgeon: Roby Lofts, MD;  Location: [facility] OR;  Service: Orthopedics;  Laterality: Left;   TOTAL ABDOMINAL HYSTERECTOMY     TOTAL KNEE ARTHROPLASTY Right 05/27/2019   Procedure: TOTAL KNEE ARTHROPLASTY;  Surgeon: Dannielle Huh, MD;  Location: [facility] ORS;  Service: Orthopedics;  Laterality: Right;  75 mins needed for length of case   TOTAL KNEE ARTHROPLASTY Left 12/30/2019   Procedure: TOTAL KNEE ARTHROPLASTY;  Surgeon: Dannielle Huh, MD;  Location: [facility];  Service: Orthopedics;  Laterality: Left;   VIDEO ASSISTED THORACOSCOPY (VATS)/WEDGE RESECTION Right 05/09/2016   Procedure: VIDEO ASSISTED THORACOSCOPY (VATS)/WEDGE RESECTION;  Surgeon: Loreli Slot, MD;  Location: [facility] OR;  Service: Thoracic;  Laterality: Right;   Patient Active Problem List   Diagnosis Date Noted  Concern about memory 08/30/2023   OAB (overactive bladder) 08/30/2023   Gait abnormality 05/04/2023   Frequent falls 05/04/2023   Mass of left wrist 05/04/2023   Dizziness 04/05/2023   History of home oxygen therapy 04/05/2023   Urinary frequency 02/20/2023   Nocturnal enuresis 02/20/2023   Pneumonia due to COVID-19 virus 07/28/2021   Dyslipidemia 07/28/2021   Cystitis 05/11/2021   Sinus bradycardia 05/11/2021   Acute on chronic anemia 05/11/2021   Closed fracture of left distal radius 06/13/2020   Vitamin D insufficiency 06/13/2020   Greater trochanter fracture (HCC), left 06/11/2020   History of total knee replacement 06/11/2019   S/P total knee replacement 05/27/2019   DDD (degenerative disc disease), 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    Bilateral renal cysts 05/21/2018   Hiatal hernia 05/21/2018   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   Thoracic radiculitis (Bilateral) (L>R) 05/03/2018   Chronic thoracic back pain (Left) 05/03/2018   Injury of peripheral nerves of thorax, sequela 03/22/2018   Chest pain 03/21/2018   Rib pain (Left) 03/21/2018   Chronic rib pain (Right) 03/20/2018   DDD (degenerative disc disease), lumbar 03/20/2018   Chronic fracture of pubic ramus, sequela (Right) 03/20/2018   History of femoral neck fracture, sequela (Left) 03/20/2018    Class: History of   Lumbar facet arthropathy (Bilateral) 03/20/2018   Lumbar facet syndrome (Bilateral) 03/20/2018   Osteoarthritis of facet joint of lumbar spine 03/20/2018   Spondylosis without myelopathy or radiculopathy, lumbosacral region 03/20/2018   Other specified dorsopathies, sacral and sacrococcygeal region 03/20/2018   Chronic pain syndrome 02/26/2018   Cancer related pain 02/26/2018   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   Chronic low back pain (Secondary Area of Pain) (Bilateral) 02/26/2018   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   Chronic lower extremity pain (Tertiary Area of Pain) (Bilateral) (R>L) 02/26/2018   Chronic hip pain (Fourth Area of Pain) (Bilateral) (R>L) 02/26/2018   Neurogenic pain 02/26/2018   Pharmacologic therapy 02/26/2018   Problems influencing health status 02/26/2018   Long term prescription benzodiazepine use 02/26/2018   Long term prescription opiate use 02/26/2018   History of uterine cancer 12/08/2017   Generalized anxiety disorder 12/08/2017   GERD (gastroesophageal reflux disease) 12/08/2017   Substernal chest pain 12/08/2017   Chronic sacroiliac joint pain 06/21/2017   Disorder of skeletal system 06/21/2017   Other long term (current) drug therapy 06/21/2017   Other specified health status 06/21/2017   Chronic chest wall pain (Primary Area of Pain)  06/21/2017   Chronic post-thoracotomy pain 03/03/2017   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   Adenocarcinoma of right lung, stage 1 (HCC) 07/28/2016   S/P lobectomy of lung 05/09/2016   DVT (deep venous thrombosis) (HCC) 04/26/2016   Heart murmur    Essential hypertension 08/29/2015   Hyperlipidemia 08/29/2015   CKD (chronic kidney disease), stage III (HCC) - baseline SCr 1.3-1.5 08/29/2015   COPD (chronic obstructive pulmonary disease) (HCC) 04/27/2015   Infected sebaceous cyst 08/16/2011    PCP: Mordecai Maes MD   REFERRING PROVIDER: Eden Emms, NP  REFERRING DIAG: R29.6 (ICD-10-CM) - Frequent falls R42 (ICD-10-CM) - Dizziness  THERAPY DIAG:  Unsteadiness on feet  Muscle weakness (generalized)  History of falling  Difficulty in walking, not elsewhere classified  Rationale for Evaluation and Treatment: Rehabilitation  ONSET DATE: chronic   SUBJECTIVE:   SUBJECTIVE STATEMENT:  Nothing new, I think the 26th is my last day here. I feel like its helped.  EVAL:  Tripped over a box that my son left on the porch, had a big bruise on my face for awhile. Get dizzy when I get up real fast, I have to move slow. Have had issues with being unsteady for awhile, its nothing new.   PERTINENT HISTORY: See above  PAIN:  Are you having pain?  0/10 now   PRECAUTIONS: Fall  RED FLAGS: None   WEIGHT BEARING RESTRICTIONS: No  FALLS:  Has patient fallen in last 6 months? Yes. Number of falls 1; tripped on box on the porch   LIVING ENVIRONMENT: Lives with: lives alone but son and daughter live nearby and take good care of her  Lives in: House/apartment Stairs: 4-5 STE with rails; level home once inside  Has following equipment at home: Single point cane, Environmental consultant - 2 wheeled, and Wheelchair (manual)  OCCUPATION: retired   PLOF: Independent, Independent with basic ADLs, Independent with gait, and Independent with transfers  PATIENT GOALS: "I'm not sure I feel  pretty ok as is"   NEXT MD VISIT: Referring 11/27/23   OBJECTIVE:  Note: Objective measures were completed at Evaluation unless otherwise noted.    PATIENT SURVEYS:  ABC scale 70%  COGNITION: Overall cognitive status: History of cognitive impairments - at baseline; 2/3 on 3 word recall test      SENSATION: Not tested     LOWER EXTREMITY MMT:  MMT Right eval Left eval  Hip flexion 3+ 3+  Hip extension    Hip abduction 3+ 3+  Hip adduction    Hip internal rotation    Hip external rotation    Knee flexion 5 5  Knee extension 5 5  Ankle dorsiflexion 5 5  Ankle plantarflexion    Ankle inversion    Ankle eversion     (Blank rows = not tested)    FUNCTIONAL TESTS:  5 times sit to stand: 16 seconds with BUEs due to posterior lean  Timed up and go (TUG): 14.5 seconds min guard/no device extended time to turn around cone; 10/11/23 14.3 seconds  3 minute walk test: 345ft one seated rest break, increased WOB and unsteadiness dramatically increased along with fatigue   GAIT: Distance walked: in clinic distances  Assistive device utilized: None Level of assistance: SBA Comments: generally unsteady, drifting R/L and slow gait speed                                                                                                                                 TREATMENT DATE:   10/11/23  Checked STGs as below  Nustep L5x8 minutes all four extremities for w/u, tissue perfusion Side stepping around cone square with random direction changes max cues  Weaving in out and out of cones + cone taps (2 rounds) Attempted standing on air pads but unable to convince her to let go of // bars, she was very tickled by this task Walking outside over grass and gravel with  very close min guard- moderately unsteady     10/09/23  Alternating toe taps on 4 inch step x20 Mod cues and redirection Forward stepping over half foam rolls x4 rounds in // bars Side stepping over half foam  rolls x2 rounds in // bars Nustep L5x8 minutes all four extremities   Walking with 2# each LE + carrying 2.2kg ball 119ftx2      10/05/23 In parallel bars  Standing marches Standing hip abd  Cone taps  Ball squeeze 2x10 Hip abd red 2x10 Seated march red band 2x10 HS curls red 2x10  LAQ 2# 2x10 Stepping over obstacles forwards and side steps         PATIENT EDUCATION:  Education details: exam findings, POC, HEP  Person educated: Patient Education method: Programmer, multimedia, Facilities manager, Verbal cues, and Handouts Education comprehension: verbalized understanding, returned demonstration, and needs further education  HOME EXERCISE PROGRAM: Access Code: [email or web address] URL: [email or web address] Date: 09/13/2023 Prepared by: Nedra Hai  Exercises - Seated March with Resistance  - 1 x daily - 7 x weekly - 2 sets - 10 reps - 2 seconds  hold - Seated Hip Abduction with Resistance  - 1 x daily - 7 x weekly - 2 sets - 10 reps - 2 seconds  hold - Sit to Stand with Arms Crossed  - 1 x daily - 7 x weekly - 2 sets - 5 reps  ASSESSMENT:  CLINICAL IMPRESSION:  Pt arrives doing OK, we checked STGs today as below. Otherwise continued working on functional activity tolerance and balance, very pleasantly confused and needed a lot of redirection to task at hand this session. She tells me she is able to do what she needs and wants at this point, feels better with her moving around. Her steadiness with gait has improved considerably since eval but she is still fairly steady outside on uneven surfaces. She has 2 additional visits scheduled, will plan on checking LTGs on 10th visit to determine need to continue vs DC. Progress has been somewhat limited by cog status.     OBJECTIVE IMPAIRMENTS: Abnormal gait, decreased activity tolerance, decreased balance, decreased cognition, decreased coordination, decreased mobility, difficulty walking, decreased strength, and decreased safety  awareness.   ACTIVITY LIMITATIONS: standing, squatting, stairs, transfers, bathing, toileting, dressing, and locomotion level  PARTICIPATION LIMITATIONS: meal prep, cleaning, laundry, shopping, community activity, and yard work  PERSONAL FACTORS: Age, Behavior pattern, Education, Fitness, Past/current experiences, Social background, and Time since onset of injury/illness/exacerbation are also affecting patient's functional outcome.   REHAB POTENTIAL: Good  CLINICAL DECISION MAKING: Stable/uncomplicated  EVALUATION COMPLEXITY: Low   GOALS: Goals reviewed with patient? No  SHORT TERM GOALS: Target date: 10/11/2023   Will be compliant with appropriate progressive HEP  Baseline: Goal status: ONGOING 10/01/23     2.  Will be able to name 3 ways to prevent falls at home and in the community  Baseline:  Goal status: PARTIALLY MET 10/01/23 limited by cog status able to name some but needed heavy cues and prompts   3.  Will complete TUG in 10 seconds or less with LRAD, minimal unsteadiness   Baseline:  Goal status: INITIAL    LONG TERM GOALS: Target date: 11/08/2023    MMT to have improved by at least one grade in all weak groups  Baseline:  Goal status: INITIAL  2.  Will complete 5xSTS test in 12 seconds or less to show improved mobility and strength, no posterior LOB  Baseline:  Goal status: INITIAL  3.  Will score at least 48 on Berg to show reduced fall risk  Baseline:  Goal status: INITIAL    4.  Will score 85% on ABC survey to show improved subjective status  Baseline:  Goal status: INITIAL  5. Will ambulate at least 436ft in without fatigue to improve community access  Baseline: Goal status: INITIAL      PLAN:  PT FREQUENCY: 1-2x/week  PT DURATION: 8 weeks  PLANNED INTERVENTIONS: 97110-Therapeutic exercises, 97530- Therapeutic activity, 97112- Neuromuscular re-education, 97535- Self Care, 16109- Manual therapy, and 97116- Gait training  PLAN  FOR NEXT SESSION: keep it simple and functional, balance, strength, general conditioning as tolerated, modifications as appropriate for knee pain, check HR PRN.   Nedra Hai, PT, DPT 10/11/23 11:44 AM

## 2023-10-12 ENCOUNTER — Encounter: Payer: Self-pay | Admitting: Urology

## 2023-10-12 ENCOUNTER — Ambulatory Visit (INDEPENDENT_AMBULATORY_CARE_PROVIDER_SITE_OTHER): Payer: 59 | Admitting: Urology

## 2023-10-12 ENCOUNTER — Telehealth: Payer: Self-pay | Admitting: Nurse Practitioner

## 2023-10-12 VITALS — BP 147/82 | HR 84 | Ht 61.0 in | Wt 136.0 lb

## 2023-10-12 DIAGNOSIS — R8281 Pyuria: Secondary | ICD-10-CM

## 2023-10-12 DIAGNOSIS — R351 Nocturia: Secondary | ICD-10-CM | POA: Diagnosis not present

## 2023-10-12 DIAGNOSIS — N3281 Overactive bladder: Secondary | ICD-10-CM | POA: Diagnosis not present

## 2023-10-12 LAB — MICROSCOPIC EXAMINATION: WBC, UA: >30 /HPF — AB (ref 0–5)

## 2023-10-12 LAB — URINALYSIS, COMPLETE
Bilirubin, UA: NEGATIVE
Glucose, UA: NEGATIVE
Ketones, UA: NEGATIVE
Nitrite, UA: POSITIVE — AB
Protein,UA: NEGATIVE
RBC, UA: NEGATIVE
Specific Gravity, UA: 1.01 (ref 1.005–1.030)
Urobilinogen, Ur: 0.2 mg/dL (ref 0.2–1.0)
pH, UA: 7 (ref 5.0–7.5)

## 2023-10-12 LAB — BLADDER SCAN AMB NON-IMAGING: Scan Result: 116

## 2023-10-12 MED ORDER — GEMTESA 75 MG PO TABS: 75.0000 mg | ORAL_TABLET | Freq: Every day | ORAL | Status: AC

## 2023-10-12 NOTE — Telephone Encounter (Signed)
 Pt's daughter called stating she received a call from [facility] regarding pt's lab results. Pt's daughter requested a call back. Call back # [phone]

## 2023-10-12 NOTE — Patient Instructions (Signed)
 Call back and let us know how the gemtesa is working.

## 2023-10-12 NOTE — Telephone Encounter (Signed)
 Returned call and relayed lab results to tammy hunt.

## 2023-10-12 NOTE — Progress Notes (Signed)
 I, Maysun Anabel Bene, acting as a scribe for Riki Altes, MD., have documented all relevant documentation on the behalf of Riki Altes, MD, as directed by Riki Altes, MD while in the presence of Riki Altes, MD.  10/12/2023 5:26 PM   [name] 31-Jul-1940 [id number]  Referring provider: Eden Emms, NP [address] [place],  [place] [address]  Chief Complaint  Patient presents with   Establish Care   Over Active Bladder    HPI: [name] is a 83 y.o. female referred for evaluation of overactive bladder.   Severe history of overactive bladder and has been on the extended-release oxybutenin 5 mg daily.  She presents today complaining of bothersome nocturia for the last 3-4 months. She estimates she gets up 5-6 times per night and will have incontinent episodes at nighttime. She states her voided volumes are significant and she restricts her fluids after 6 pm. She denies recurrent UTIs and she denies daytime voiding symptoms or incontinence. She lives alone, and there is no known history of sleep apnea. She was initially unable to void in the office, and estimated vital volume was 116 mL. She was able to successfully void.   PMH: Past Medical History:  Diagnosis Date   Acute metabolic encephalopathy 06/02/2019   Adenocarcinoma of right lung, stage 1 (HCC) 07/28/2016   Anxiety    Cancer (HCC)    uterine   Closed fracture of left distal radius 06/13/2020   COPD (chronic obstructive pulmonary disease) (HCC)    Coronary artery disease    mild nonobstructive by 2019 cath   Depression    GERD (gastroesophageal reflux disease)    Hyperlipidemia    Hypertension    Low back pain    Osteoarthritis    Osteoporosis    Takotsubo cardiomyopathy    EF recovered to 50-55% by 01/23/18 echo   Vitamin D insufficiency 06/13/2020    Surgical History: Past Surgical History:  Procedure Laterality Date   ANKLE SURGERY Right    horse accident   APPENDECTOMY      BACK SURGERY     CHOLECYSTECTOMY     EYE SURGERY     lense implant   FRACTURE SURGERY     HIP SURGERY  01/2010   Left Hip    INNER EAR SURGERY Bilateral 1970   related to severe ear infections   KNEE SURGERY     Right knee   LEFT HEART CATH AND CORONARY ANGIOGRAPHY N/A 12/11/2017   Procedure: LEFT HEART CATH AND CORONARY ANGIOGRAPHY;  Surgeon: Corky Crafts, MD;  Location: [facility];  Service: Cardiovascular;  Laterality: N/A;   LOBECTOMY Right 05/09/2016   Procedure: RIGHT UPPER LOBECTOMY;  Surgeon: Loreli Slot, MD;  Location: [facility] OR;  Service: Thoracic;  Laterality: Right;   MASS EXCISION  08/25/2011   Procedure: EXCISION MASS;  Surgeon: Shelly Rubenstein, MD;  Location: [facility];  Service: General;  Laterality: N/A;  excision left neck mass   OPEN REDUCTION INTERNAL FIXATION (ORIF) DISTAL RADIAL FRACTURE Left 06/12/2020   Procedure: OPEN REDUCTION INTERNAL FIXATION (ORIF) DISTAL RADIAL FRACTURE;  Surgeon: Roby Lofts, MD;  Location: [facility] OR;  Service: Orthopedics;  Laterality: Left;   TOTAL ABDOMINAL HYSTERECTOMY     TOTAL KNEE ARTHROPLASTY Right 05/27/2019   Procedure: TOTAL KNEE ARTHROPLASTY;  Surgeon: Dannielle Huh, MD;  Location: [facility];  Service: Orthopedics;  Laterality: Right;  75 mins needed for  length of case   TOTAL KNEE ARTHROPLASTY Left 12/30/2019   Procedure: TOTAL KNEE ARTHROPLASTY;  Surgeon: Dannielle Huh, MD;  Location: [facility];  Service: Orthopedics;  Laterality: Left;   VIDEO ASSISTED THORACOSCOPY (VATS)/WEDGE RESECTION Right 05/09/2016   Procedure: VIDEO ASSISTED THORACOSCOPY (VATS)/WEDGE RESECTION;  Surgeon: Loreli Slot, MD;  Location: [facility] OR;  Service: Thoracic;  Laterality: Right;    Home Medications:  Allergies as of 10/12/2023       Reactions   Boniva [ibandronate Sodium] Other (See Comments)   Upset stomach   Lyrica [pregabalin] Nausea And Vomiting   Dizziness and blurred vision as well   Levofloxacin Other  (See Comments)   Behavior changes   Nitrofurantoin Monohyd Macro Rash   Reaction to [facility]        Medication List        Accurate as of October 12, 2023  5:26 PM. If you have any questions, ask your nurse or doctor.          acetaminophen 500 MG tablet Commonly known as: TYLENOL Take 2 tablets (1,000 mg total) by mouth every 6 (six) hours. What changed:  how much to take when to take this reasons to take this   albuterol 108 (90 Base) MCG/ACT inhaler Commonly known as: VENTOLIN HFA Inhale 2 puffs into the lungs every 6 (six) hours as needed.   amLODipine 5 MG tablet Commonly known as: NORVASC Take 5 mg by mouth every morning.   aspirin EC 325 MG tablet Take 1 tablet (325 mg total) by mouth 2 (two) times daily.   atorvastatin 20 MG tablet Commonly known as: LIPITOR Take 20 mg by mouth at bedtime.   Calcium Carb-Cholecalciferol 600-10 MG-MCG Tabs Take by mouth.   famotidine 20 MG tablet Commonly known as: PEPCID Take 1 tablet (20 mg total) by mouth at bedtime.   furosemide 20 MG tablet Commonly known as: LASIX Take 20 mg by mouth every Monday, Wednesday, and Friday.   gabapentin 400 MG capsule Commonly known as: NEURONTIN Take 400 mg by mouth 3 (three) times daily.   Gemtesa 75 MG Tabs Generic drug: Vibegron Take 1 tablet (75 mg total) by mouth daily at 6 (six) AM. Started by: Riki Altes   Glucosamine HCl 1500 MG Tabs Take by mouth.   Iron (Ferrous Sulfate) 325 (65 Fe) MG Tabs Take 325 mg by mouth daily.   lisinopril 10 MG tablet Commonly known as: ZESTRIL Take 10 mg by mouth every morning.   meclizine 12.5 MG tablet Commonly known as: ANTIVERT Take 1 tablet (12.5 mg total) by mouth 3 (three) times daily as needed for dizziness.   metoCLOPramide 5 MG tablet Commonly known as: REGLAN Take 5 mg by mouth 2 (two) times daily.   mirtazapine 7.5 MG tablet Commonly known as: REMERON Take 7.5 mg by mouth at bedtime.   oxybutynin 5 MG 24 hr  tablet Commonly known as: DITROPAN-XL Take 5 mg by mouth at bedtime.   raloxifene 60 MG tablet Commonly known as: EVISTA Take 60 mg by mouth every morning.   sertraline 100 MG tablet Commonly known as: ZOLOFT Take 100 mg by mouth in the morning and at bedtime.   tiZANidine 2 MG tablet Commonly known as: ZANAFLEX Take 2 mg by mouth 3 (three) times daily as needed.   Trelegy Ellipta 100-62.5-25 MCG/ACT Aepb Generic drug: Fluticasone-Umeclidin-Vilant Inhale 1 puff into the lungs every morning.        Allergies:  Allergies  Allergen Reactions  Boniva [Ibandronate Sodium] Other (See Comments)    Upset stomach   Lyrica [Pregabalin] Nausea And Vomiting    Dizziness and blurred vision as well   Levofloxacin Other (See Comments)    Behavior changes   Nitrofurantoin Monohyd Macro Rash    Reaction to [employer]    Family History: Family History  Problem Relation Age of Onset   Heart disease Mother    Heart disease Father    Cancer Sister        #1, breast   Heart disease Brother        had CABG    Social History:  reports that she quit smoking about 11 years ago. Her smoking use included cigarettes. She started smoking about 62 years ago. She has a 102 pack-year smoking history. She has never used smokeless tobacco. She reports that she does not drink alcohol and does not use drugs.   Physical Exam: BP (!) 147/82   Pulse 84   Ht 5\' 1"  (1.549 m)   Wt 136 lb (61.7 kg)   BMI 25.70 kg/m   Constitutional:  Alert and oriented, No acute distress. HEENT: [place] AT, moist mucus membranes.  Trachea midline, no masses. Cardiovascular: No clubbing, cyanosis, or edema. Respiratory: Normal respiratory effort, no increased work of breathing. GI: Abdomen is soft, nontender, nondistended, no abdominal masses Skin: No rashes, bruises or suspicious lesions. Neurologic: Grossly intact, no focal deficits, moving all 4 extremities. Psychiatric: Normal mood and affect.  Assessment & Plan:     1. Nocturia She has no bothersome daytime symptoms and complaints of significant nocturia.  Will give a trial of Gemtesa 75 mg daily. She is unsure if she is still taking Oxybutynin, however she would be able to take both of these medications.  If no significant improvement in her symptoms on Gemtesa, would recommend a sleep study for evaluation for sleep apnea, which would need to be ordered by her PCP.  We also discussed the possibility of nocturnal polyuria, however would not recommend Desmopressin in her age group.  [facility] [address] [place], [place] [address] [phone]

## 2023-10-13 ENCOUNTER — Encounter: Payer: Self-pay | Admitting: Urology

## 2023-10-13 ENCOUNTER — Ambulatory Visit (HOSPITAL_COMMUNITY)
Admission: RE | Admit: 2023-10-13 | Discharge: 2023-10-13 | Disposition: A | Discharge status: Home / Self Care | Source: Ambulatory Visit | Attending: Nurse Practitioner | Admitting: Nurse Practitioner

## 2023-10-13 DIAGNOSIS — Z711 Person with feared health complaint in whom no diagnosis is made: Secondary | ICD-10-CM | POA: Insufficient documentation

## 2023-10-13 DIAGNOSIS — R9089 Other abnormal findings on diagnostic imaging of central nervous system: Secondary | ICD-10-CM | POA: Diagnosis not present

## 2023-10-13 DIAGNOSIS — G238 Other specified degenerative diseases of basal ganglia: Secondary | ICD-10-CM | POA: Diagnosis not present

## 2023-10-13 DIAGNOSIS — R413 Other amnesia: Secondary | ICD-10-CM | POA: Diagnosis not present

## 2023-10-13 DIAGNOSIS — R42 Dizziness and giddiness: Secondary | ICD-10-CM | POA: Insufficient documentation

## 2023-10-16 ENCOUNTER — Ambulatory Visit: Payer: 59 | Admitting: Physical Therapy

## 2023-10-16 ENCOUNTER — Encounter: Payer: Self-pay | Admitting: Physical Therapy

## 2023-10-16 DIAGNOSIS — M6281 Muscle weakness (generalized): Secondary | ICD-10-CM

## 2023-10-16 DIAGNOSIS — R262 Difficulty in walking, not elsewhere classified: Secondary | ICD-10-CM | POA: Diagnosis not present

## 2023-10-16 DIAGNOSIS — Z9181 History of falling: Secondary | ICD-10-CM | POA: Diagnosis not present

## 2023-10-16 DIAGNOSIS — R2681 Unsteadiness on feet: Secondary | ICD-10-CM

## 2023-10-16 LAB — CULTURE, URINE COMPREHENSIVE

## 2023-10-16 NOTE — Therapy (Signed)
 OUTPATIENT PHYSICAL THERAPY LOWER EXTREMITY TREATMENT    Patient Name: [name] MRN: [id number] DOB:[birth year], 83 y.o., female Today's Date: 10/16/2023  Progress Note Reporting Period 09/13/23 to 10/16/23  See note below for Objective Data and Assessment of Progress/Goals.      END OF SESSION:  PT End of Session - 10/16/23 1141     Visit Number 9    Number of Visits 10    Authorization Type [employer] Dual    Authorization Time Period 09/13/23 to 11/08/23    Progress Note Due on Visit 19    PT Start Time 1102    PT Stop Time 1142    PT Time Calculation (min) 40 min    Activity Tolerance Patient tolerated treatment well    Behavior During Therapy [facility] for tasks assessed/performed                    Past Medical History:  Diagnosis Date   Acute metabolic encephalopathy 06/02/2019   Adenocarcinoma of right lung, stage 1 (HCC) 07/28/2016   Anxiety    Cancer (HCC)    uterine   Closed fracture of left distal radius 06/13/2020   COPD (chronic obstructive pulmonary disease) (HCC)    Coronary artery disease    mild nonobstructive by 2019 cath   Depression    GERD (gastroesophageal reflux disease)    Hyperlipidemia    Hypertension    Low back pain    Osteoarthritis    Osteoporosis    Takotsubo cardiomyopathy    EF recovered to 50-55% by 01/23/18 echo   Vitamin D insufficiency 06/13/2020   Past Surgical History:  Procedure Laterality Date   ANKLE SURGERY Right    horse accident   APPENDECTOMY     BACK SURGERY     CHOLECYSTECTOMY     EYE SURGERY     lense implant   FRACTURE SURGERY     HIP SURGERY  01/2010   Left Hip    INNER EAR SURGERY Bilateral 1970   related to severe ear infections   KNEE SURGERY     Right knee   LEFT HEART CATH AND CORONARY ANGIOGRAPHY N/A 12/11/2017   Procedure: LEFT HEART CATH AND CORONARY ANGIOGRAPHY;  Surgeon: Corky Crafts, MD;  Location: [facility];  Service: Cardiovascular;  Laterality: N/A;   LOBECTOMY  Right 05/09/2016   Procedure: RIGHT UPPER LOBECTOMY;  Surgeon: Loreli Slot, MD;  Location: [facility] OR;  Service: Thoracic;  Laterality: Right;   MASS EXCISION  08/25/2011   Procedure: EXCISION MASS;  Surgeon: Shelly Rubenstein, MD;  Location: [facility];  Service: General;  Laterality: N/A;  excision left neck mass   OPEN REDUCTION INTERNAL FIXATION (ORIF) DISTAL RADIAL FRACTURE Left 06/12/2020   Procedure: OPEN REDUCTION INTERNAL FIXATION (ORIF) DISTAL RADIAL FRACTURE;  Surgeon: Roby Lofts, MD;  Location: [facility] OR;  Service: Orthopedics;  Laterality: Left;   TOTAL ABDOMINAL HYSTERECTOMY     TOTAL KNEE ARTHROPLASTY Right 05/27/2019   Procedure: TOTAL KNEE ARTHROPLASTY;  Surgeon: Dannielle Huh, MD;  Location: [facility] ORS;  Service: Orthopedics;  Laterality: Right;  75 mins needed for length of case   TOTAL KNEE ARTHROPLASTY Left 12/30/2019   Procedure: TOTAL KNEE ARTHROPLASTY;  Surgeon: Dannielle Huh, MD;  Location: [facility];  Service: Orthopedics;  Laterality: Left;   VIDEO ASSISTED THORACOSCOPY (VATS)/WEDGE RESECTION Right 05/09/2016   Procedure: VIDEO ASSISTED THORACOSCOPY (VATS)/WEDGE RESECTION;  Surgeon: Loreli Slot, MD;  Location: [facility] OR;  Service:  Thoracic;  Laterality: Right;   Patient Active Problem List   Diagnosis Date Noted   Concern about memory 08/30/2023   OAB (overactive bladder) 08/30/2023   Gait abnormality 05/04/2023   Frequent falls 05/04/2023   Mass of left wrist 05/04/2023   Dizziness 04/05/2023   History of home oxygen therapy 04/05/2023   Urinary frequency 02/20/2023   Nocturnal enuresis 02/20/2023   Pneumonia due to COVID-19 virus 07/28/2021   Dyslipidemia 07/28/2021   Cystitis 05/11/2021   Sinus bradycardia 05/11/2021   Acute on chronic anemia 05/11/2021   Closed fracture of left distal radius 06/13/2020   Vitamin D insufficiency 06/13/2020   Greater trochanter fracture (HCC), left 06/11/2020   History of total knee replacement 06/11/2019    S/P total knee replacement 05/27/2019   DDD (degenerative disc disease), 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   Bilateral renal cysts 05/21/2018   Hiatal hernia 05/21/2018   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   Thoracic radiculitis (Bilateral) (L>R) 05/03/2018   Chronic thoracic back pain (Left) 05/03/2018   Injury of peripheral nerves of thorax, sequela 03/22/2018   Chest pain 03/21/2018   Rib pain (Left) 03/21/2018   Chronic rib pain (Right) 03/20/2018   DDD (degenerative disc disease), lumbar 03/20/2018   Chronic fracture of pubic ramus, sequela (Right) 03/20/2018   History of femoral neck fracture, sequela (Left) 03/20/2018    Class: History of   Lumbar facet arthropathy (Bilateral) 03/20/2018   Lumbar facet syndrome (Bilateral) 03/20/2018   Osteoarthritis of facet joint of lumbar spine 03/20/2018   Spondylosis without myelopathy or radiculopathy, lumbosacral region 03/20/2018   Other specified dorsopathies, sacral and sacrococcygeal region 03/20/2018   Chronic pain syndrome 02/26/2018   Cancer related pain 02/26/2018   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   Chronic low back pain (Secondary Area of Pain) (Bilateral) 02/26/2018   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   Chronic lower extremity pain (Tertiary Area of Pain) (Bilateral) (R>L) 02/26/2018   Chronic hip pain (Fourth Area of Pain) (Bilateral) (R>L) 02/26/2018   Neurogenic pain 02/26/2018   Pharmacologic therapy 02/26/2018   Problems influencing health status 02/26/2018   Long term prescription benzodiazepine use 02/26/2018   Long term prescription opiate use 02/26/2018   History of uterine cancer 12/08/2017   Generalized anxiety disorder 12/08/2017   GERD (gastroesophageal reflux disease) 12/08/2017   Substernal chest pain 12/08/2017   Chronic sacroiliac joint pain 06/21/2017   Disorder of skeletal system 06/21/2017   Other long term (current) drug therapy 06/21/2017    Other specified health status 06/21/2017   Chronic chest wall pain (Primary Area of Pain) 06/21/2017   Chronic post-thoracotomy pain 03/03/2017   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   Adenocarcinoma of right lung, stage 1 (HCC) 07/28/2016   S/P lobectomy of lung 05/09/2016   DVT (deep venous thrombosis) (HCC) 04/26/2016   Heart murmur    Essential hypertension 08/29/2015   Hyperlipidemia 08/29/2015   CKD (chronic kidney disease), stage III (HCC) - baseline SCr 1.3-1.5 08/29/2015   COPD (chronic obstructive pulmonary disease) (HCC) 04/27/2015   Infected sebaceous cyst 08/16/2011    PCP: Mordecai Maes MD   REFERRING PROVIDER: Eden Emms, NP  REFERRING DIAG: R29.6 (ICD-10-CM) - Frequent falls R42 (ICD-10-CM) - Dizziness  THERAPY DIAG:  Unsteadiness on feet  Muscle weakness (generalized)  History of falling  Difficulty in walking, not elsewhere classified  Rationale for Evaluation and Treatment: Rehabilitation  ONSET DATE: chronic   SUBJECTIVE:   SUBJECTIVE STATEMENT:  Nothing  new, I'm doing a lot better   EVAL: Tripped over a box that my son left on the porch, had a big bruise on my face for awhile. Get dizzy when I get up real fast, I have to move slow. Have had issues with being unsteady for awhile, its nothing new.   PERTINENT HISTORY: See above  PAIN:  Are you having pain?  0/10    PRECAUTIONS: Fall  RED FLAGS: None   WEIGHT BEARING RESTRICTIONS: No  FALLS:  Has patient fallen in last 6 months? Yes. Number of falls 1; tripped on box on the porch   LIVING ENVIRONMENT: Lives with: lives alone but son and daughter live nearby and take good care of her  Lives in: House/apartment Stairs: 4-5 STE with rails; level home once inside  Has following equipment at home: Single point cane, Environmental consultant - 2 wheeled, and Wheelchair (manual)  OCCUPATION: retired   PLOF: Independent, Independent with basic ADLs, Independent with gait, and Independent  with transfers  PATIENT GOALS: "I'm not sure I feel pretty ok as is"   NEXT MD VISIT: Referring 11/27/23   OBJECTIVE:  Note: Objective measures were completed at Evaluation unless otherwise noted.    PATIENT SURVEYS:  ABC scale 70%; 10/16/23- 85%  COGNITION: Overall cognitive status: History of cognitive impairments - at baseline; 2/3 on 3 word recall test      SENSATION: Not tested     LOWER EXTREMITY MMT:  MMT Right eval Left eval Right 10/16/23 Left 10/16/23  Hip flexion 3+ 3+ 4 4  Hip extension      Hip abduction 3+ 3+ 4 4  Hip adduction      Hip internal rotation      Hip external rotation      Knee flexion 5 5    Knee extension 5 5    Ankle dorsiflexion 5 5    Ankle plantarflexion      Ankle inversion      Ankle eversion       (Blank rows = not tested)    FUNCTIONAL TESTS:  5 times sit to stand: 16 seconds with BUEs due to posterior lean; 10/16/23 12 seconds no posterior lean   Timed up and go (TUG): 14.5 seconds min guard/no device extended time to turn around cone; 10/11/23 14.3 seconds; 10/16/23- 10 seconds   3 minute walk test: 382ft one seated rest break, increased WOB and unsteadiness dramatically increased along with fatigue; 10/16/23 322ft one seated rest break no device no unsteadiness      10/16/23    10/16/23 0001  Standardized Balance Assessment  Standardized Balance Assessment Berg Balance Test  Berg Balance Test  Sit to Stand 4  Standing Unsupported 4  Sitting with Back Unsupported but Feet Supported on Floor or Stool 4  Stand to Sit 4  Transfers 3  Standing Unsupported with Eyes Closed 4  Standing Unsupported with Feet Together 0 (command following limited by cognition)  From Standing, Reach Forward with Outstretched Arm 3  From Standing Position, Pick up Object from Floor 4  From Standing Position, Turn to Look Behind Over each Shoulder 4  Turn 360 Degrees 4  Standing Unsupported, Alternately Place Feet on Step/Stool 4   Standing Unsupported, One Foot in Front 1  Standing on One Leg 1  Total Score 44       GAIT: Distance walked: in clinic distances  Assistive device utilized: None Level of assistance: SBA Comments: generally unsteady, drifting R/L and slow gait speed  TREATMENT DATE:   10/16/23  Nustep L5x10 minutes all four extremities   Objective measures as above   Education on POC, DC next visit, progress with PT       10/11/23  Checked STGs as below  Nustep L5x8 minutes all four extremities for w/u, tissue perfusion Side stepping around cone square with random direction changes max cues  Weaving in out and out of cones + cone taps (2 rounds) Attempted standing on air pads but unable to convince her to let go of // bars, she was very tickled by this task Walking outside over grass and gravel with very close min guard- moderately unsteady     10/09/23  Alternating toe taps on 4 inch step x20 Mod cues and redirection Forward stepping over half foam rolls x4 rounds in // bars Side stepping over half foam rolls x2 rounds in // bars Nustep L5x8 minutes all four extremities   Walking with 2# each LE + carrying 2.2kg ball 136ftx2      10/05/23 In parallel bars  Standing marches Standing hip abd  Cone taps  Ball squeeze 2x10 Hip abd red 2x10 Seated march red band 2x10 HS curls red 2x10  LAQ 2# 2x10 Stepping over obstacles forwards and side steps         PATIENT EDUCATION:  Education details: exam findings, POC, HEP  Person educated: Patient Education method: Programmer, multimedia, Facilities manager, Verbal cues, and Handouts Education comprehension: verbalized understanding, returned demonstration, and needs further education  HOME EXERCISE PROGRAM: Access Code: [email or web address] URL: [email or web address] Date: 09/13/2023 Prepared by:  Nedra Hai  Exercises - Seated March with Resistance  - 1 x daily - 7 x weekly - 2 sets - 10 reps - 2 seconds  hold - Seated Hip Abduction with Resistance  - 1 x daily - 7 x weekly - 2 sets - 10 reps - 2 seconds  hold - Sit to Stand with Arms Crossed  - 1 x daily - 7 x weekly - 2 sets - 5 reps  ASSESSMENT:  CLINICAL IMPRESSION:  Pt arrives today doing really well, still very pleasantly confused. Focused on updating all measurements today- she is making good progress with PT but I think due to cognitive status we are close to having maximized benefit from skilled PT services. Will plan on one more skilled session then DC to updated HEP.     OBJECTIVE IMPAIRMENTS: Abnormal gait, decreased activity tolerance, decreased balance, decreased cognition, decreased coordination, decreased mobility, difficulty walking, decreased strength, and decreased safety awareness.   ACTIVITY LIMITATIONS: standing, squatting, stairs, transfers, bathing, toileting, dressing, and locomotion level  PARTICIPATION LIMITATIONS: meal prep, cleaning, laundry, shopping, community activity, and yard work  PERSONAL FACTORS: Age, Behavior pattern, Education, Fitness, Past/current experiences, Social background, and Time since onset of injury/illness/exacerbation are also affecting patient's functional outcome.   REHAB POTENTIAL: Good  CLINICAL DECISION MAKING: Stable/uncomplicated  EVALUATION COMPLEXITY: Low   GOALS: Goals reviewed with patient? No  SHORT TERM GOALS: Target date: 10/11/2023   Will be compliant with appropriate progressive HEP  Baseline: Goal status: ONGOING 10/01/23     2.  Will be able to name 3 ways to prevent falls at home and in the community  Baseline:  Goal status: PARTIALLY MET 10/01/23 limited by cog status able to name some but needed heavy cues and prompts   3.  Will complete TUG in 10 seconds or less with LRAD, minimal unsteadiness   Baseline:  Goal status: MET   10/16/23  LONG TERM GOALS: Target date: 11/08/2023    MMT to have improved by at least one grade in all weak groups  Baseline:  Goal status: ONGOING 10/16/23  2.  Will complete 5xSTS test in 12 seconds or less to show improved mobility and strength, no posterior LOB  Baseline:  Goal status: MET 10/16/23  3.  Will score at least 48 on Berg to show reduced fall risk  Baseline:  Goal status: 10/16/23 44/56    4.  Will score 85% on ABC survey to show improved subjective status  Baseline:  Goal status: MET  10/16/23  5. Will ambulate at least 422ft in without fatigue to improve community access  Baseline: Goal status: ONGOING 10/16/23      PLAN:  PT FREQUENCY: 1-2x/week  PT DURATION: 8 weeks  PLANNED INTERVENTIONS: 97110-Therapeutic exercises, 97530- Therapeutic activity, 97112- Neuromuscular re-education, 97535- Self Care, [id number]- Manual therapy, and [id number]- Gait training  PLAN FOR NEXT SESSION: DC next visit, update HEP  Nedra Hai, PT, DPT 10/16/23 11:54 AM

## 2023-10-16 NOTE — Progress Notes (Signed)
   10/16/23 0001  Standardized Balance Assessment  Standardized Balance Assessment Berg Balance Test  Berg Balance Test  Sit to Stand 4  Standing Unsupported 4  Sitting with Back Unsupported but Feet Supported on Floor or Stool 4  Stand to Sit 4  Transfers 3  Standing Unsupported with Eyes Closed 4  Standing Unsupported with Feet Together 0 (command following limited by cognition)  From Standing, Reach Forward with Outstretched Arm 3  From Standing Position, Pick up Object from Floor 4  From Standing Position, Turn to Look Behind Over each Shoulder 4  Turn 360 Degrees 4  Standing Unsupported, Alternately Place Feet on Step/Stool 4  Standing Unsupported, One Foot in Front 1  Standing on One Leg 1  Total Score 44

## 2023-10-18 ENCOUNTER — Other Ambulatory Visit: Payer: Self-pay

## 2023-10-18 ENCOUNTER — Ambulatory Visit: Payer: 59

## 2023-10-18 DIAGNOSIS — R2681 Unsteadiness on feet: Secondary | ICD-10-CM

## 2023-10-18 DIAGNOSIS — R262 Difficulty in walking, not elsewhere classified: Secondary | ICD-10-CM | POA: Diagnosis not present

## 2023-10-18 DIAGNOSIS — M6281 Muscle weakness (generalized): Secondary | ICD-10-CM | POA: Diagnosis not present

## 2023-10-18 DIAGNOSIS — Z9181 History of falling: Secondary | ICD-10-CM | POA: Diagnosis not present

## 2023-10-18 NOTE — Therapy (Signed)
 OUTPATIENT PHYSICAL THERAPY LOWER EXTREMITY TREATMENT    Patient Name: [name] MRN: [id number] DOB:[birth year], 83 y.o., female Today's Date: 10/18/2023  DISCHARGE VISIT Reporting Period 09/13/23 to 10/18/23  See note below for Objective Data and Assessment of Progress/Goals.      END OF SESSION:  PT End of Session - 10/18/23 1106     Visit Number 10    Date for PT Re-Evaluation 11/08/23    Authorization Type [employer] Dual    Progress Note Due on Visit 19    PT Start Time 1102    PT Stop Time 1142    PT Time Calculation (min) 40 min    Activity Tolerance Patient tolerated treatment well    Behavior During Therapy WFL for tasks assessed/performed                     Past Medical History:  Diagnosis Date   Acute metabolic encephalopathy 06/02/2019   Adenocarcinoma of right lung, stage 1 (HCC) 07/28/2016   Anxiety    Cancer (HCC)    uterine   Closed fracture of left distal radius 06/13/2020   COPD (chronic obstructive pulmonary disease) (HCC)    Coronary artery disease    mild nonobstructive by 2019 cath   Depression    GERD (gastroesophageal reflux disease)    Hyperlipidemia    Hypertension    Low back pain    Osteoarthritis    Osteoporosis    Takotsubo cardiomyopathy    EF recovered to 50-55% by 01/23/18 echo   Vitamin D insufficiency 06/13/2020   Past Surgical History:  Procedure Laterality Date   ANKLE SURGERY Right    horse accident   APPENDECTOMY     BACK SURGERY     CHOLECYSTECTOMY     EYE SURGERY     lense implant   FRACTURE SURGERY     HIP SURGERY  01/2010   Left Hip    INNER EAR SURGERY Bilateral 1970   related to severe ear infections   KNEE SURGERY     Right knee   LEFT HEART CATH AND CORONARY ANGIOGRAPHY N/A 12/11/2017   Procedure: LEFT HEART CATH AND CORONARY ANGIOGRAPHY;  Surgeon: Corky Crafts, MD;  Location: [facility];  Service: Cardiovascular;  Laterality: N/A;   LOBECTOMY Right 05/09/2016   Procedure:  RIGHT UPPER LOBECTOMY;  Surgeon: Loreli Slot, MD;  Location: [facility] OR;  Service: Thoracic;  Laterality: Right;   MASS EXCISION  08/25/2011   Procedure: EXCISION MASS;  Surgeon: Shelly Rubenstein, MD;  Location: [facility];  Service: General;  Laterality: N/A;  excision left neck mass   OPEN REDUCTION INTERNAL FIXATION (ORIF) DISTAL RADIAL FRACTURE Left 06/12/2020   Procedure: OPEN REDUCTION INTERNAL FIXATION (ORIF) DISTAL RADIAL FRACTURE;  Surgeon: Roby Lofts, MD;  Location: [facility] OR;  Service: Orthopedics;  Laterality: Left;   TOTAL ABDOMINAL HYSTERECTOMY     TOTAL KNEE ARTHROPLASTY Right 05/27/2019   Procedure: TOTAL KNEE ARTHROPLASTY;  Surgeon: Dannielle Huh, MD;  Location: [facility];  Service: Orthopedics;  Laterality: Right;  75 mins needed for length of case   TOTAL KNEE ARTHROPLASTY Left 12/30/2019   Procedure: TOTAL KNEE ARTHROPLASTY;  Surgeon: Dannielle Huh, MD;  Location: [facility];  Service: Orthopedics;  Laterality: Left;   VIDEO ASSISTED THORACOSCOPY (VATS)/WEDGE RESECTION Right 05/09/2016   Procedure: VIDEO ASSISTED THORACOSCOPY (VATS)/WEDGE RESECTION;  Surgeon: Loreli Slot, MD;  Location: [facility] OR;  Service: Thoracic;  Laterality: Right;   Patient  Active Problem List   Diagnosis Date Noted   Concern about memory 08/30/2023   OAB (overactive bladder) 08/30/2023   Gait abnormality 05/04/2023   Frequent falls 05/04/2023   Mass of left wrist 05/04/2023   Dizziness 04/05/2023   History of home oxygen therapy 04/05/2023   Urinary frequency 02/20/2023   Nocturnal enuresis 02/20/2023   Pneumonia due to COVID-19 virus 07/28/2021   Dyslipidemia 07/28/2021   Cystitis 05/11/2021   Sinus bradycardia 05/11/2021   Acute on chronic anemia 05/11/2021   Closed fracture of left distal radius 06/13/2020   Vitamin D insufficiency 06/13/2020   Greater trochanter fracture (HCC), left 06/11/2020   History of total knee replacement 06/11/2019   S/P total knee replacement  05/27/2019   DDD (degenerative disc disease), 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   Bilateral renal cysts 05/21/2018   Hiatal hernia 05/21/2018   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   Thoracic radiculitis (Bilateral) (L>R) 05/03/2018   Chronic thoracic back pain (Left) 05/03/2018   Injury of peripheral nerves of thorax, sequela 03/22/2018   Chest pain 03/21/2018   Rib pain (Left) 03/21/2018   Chronic rib pain (Right) 03/20/2018   DDD (degenerative disc disease), lumbar 03/20/2018   Chronic fracture of pubic ramus, sequela (Right) 03/20/2018   History of femoral neck fracture, sequela (Left) 03/20/2018    Class: History of   Lumbar facet arthropathy (Bilateral) 03/20/2018   Lumbar facet syndrome (Bilateral) 03/20/2018   Osteoarthritis of facet joint of lumbar spine 03/20/2018   Spondylosis without myelopathy or radiculopathy, lumbosacral region 03/20/2018   Other specified dorsopathies, sacral and sacrococcygeal region 03/20/2018   Chronic pain syndrome 02/26/2018   Cancer related pain 02/26/2018   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   Chronic low back pain (Secondary Area of Pain) (Bilateral) 02/26/2018   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   Chronic lower extremity pain (Tertiary Area of Pain) (Bilateral) (R>L) 02/26/2018   Chronic hip pain (Fourth Area of Pain) (Bilateral) (R>L) 02/26/2018   Neurogenic pain 02/26/2018   Pharmacologic therapy 02/26/2018   Problems influencing health status 02/26/2018   Long term prescription benzodiazepine use 02/26/2018   Long term prescription opiate use 02/26/2018   History of uterine cancer 12/08/2017   Generalized anxiety disorder 12/08/2017   GERD (gastroesophageal reflux disease) 12/08/2017   Substernal chest pain 12/08/2017   Chronic sacroiliac joint pain 06/21/2017   Disorder of skeletal system 06/21/2017   Other long term (current) drug therapy 06/21/2017   Other specified health  status 06/21/2017   Chronic chest wall pain (Primary Area of Pain) 06/21/2017   Chronic post-thoracotomy pain 03/03/2017   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   Adenocarcinoma of right lung, stage 1 (HCC) 07/28/2016   S/P lobectomy of lung 05/09/2016   DVT (deep venous thrombosis) (HCC) 04/26/2016   Heart murmur    Essential hypertension 08/29/2015   Hyperlipidemia 08/29/2015   CKD (chronic kidney disease), stage III (HCC) - baseline SCr 1.3-1.5 08/29/2015   COPD (chronic obstructive pulmonary disease) (HCC) 04/27/2015   Infected sebaceous cyst 08/16/2011    PCP: Mordecai Maes MD   REFERRING PROVIDER: Eden Emms, NP  REFERRING DIAG: R29.6 (ICD-10-CM) - Frequent falls R42 (ICD-10-CM) - Dizziness  THERAPY DIAG:  Unsteadiness on feet  Muscle weakness (generalized)  History of falling  Difficulty in walking, not elsewhere classified  Rationale for Evaluation and Treatment: Rehabilitation  ONSET DATE: chronic   SUBJECTIVE:   SUBJECTIVE STATEMENT:I'm a lot better, my dog scratched my L leg.  I know today is my last day  EVAL: Tripped over a box that my son left on the porch, had a big bruise on my face for awhile. Get dizzy when I get up real fast, I have to move slow. Have had issues with being unsteady for awhile, its nothing new.   PERTINENT HISTORY: See above  PAIN:  Are you having pain?  0/10    PRECAUTIONS: Fall  RED FLAGS: None   WEIGHT BEARING RESTRICTIONS: No  FALLS:  Has patient fallen in last 6 months? Yes. Number of falls 1; tripped on box on the porch   LIVING ENVIRONMENT: Lives with: lives alone but son and daughter live nearby and take good care of her  Lives in: House/apartment Stairs: 4-5 STE with rails; level home once inside  Has following equipment at home: Single point cane, Environmental consultant - 2 wheeled, and Wheelchair (manual)  OCCUPATION: retired   PLOF: Independent, Independent with basic ADLs, Independent with gait, and  Independent with transfers  PATIENT GOALS: "I'm not sure I feel pretty ok as is"   NEXT MD VISIT: Referring 11/27/23   OBJECTIVE:  Note: Objective measures were completed at Evaluation unless otherwise noted.    PATIENT SURVEYS:  ABC scale 70%; 10/16/23- 85%  COGNITION: Overall cognitive status: History of cognitive impairments - at baseline; 2/3 on 3 word recall test      SENSATION: Not tested     LOWER EXTREMITY MMT:  MMT Right eval Left eval Right 10/16/23 Left 10/16/23  Hip flexion 3+ 3+ 4 4  Hip extension      Hip abduction 3+ 3+ 4 4  Hip adduction      Hip internal rotation      Hip external rotation      Knee flexion 5 5    Knee extension 5 5    Ankle dorsiflexion 5 5    Ankle plantarflexion      Ankle inversion      Ankle eversion       (Blank rows = not tested)    FUNCTIONAL TESTS:  5 times sit to stand: 16 seconds with BUEs due to posterior lean; 10/16/23 12 seconds no posterior lean   Timed up and go (TUG): 14.5 seconds min guard/no device extended time to turn around cone; 10/11/23 14.3 seconds; 10/16/23- 10 seconds   3 minute walk test: 327ft one seated rest break, increased WOB and unsteadiness dramatically increased along with fatigue; 10/16/23 332ft one seated rest break no device no unsteadiness      10/16/23    10/16/23 0001  Standardized Balance Assessment  Standardized Balance Assessment Berg Balance Test  Berg Balance Test  Sit to Stand 4  Standing Unsupported 4  Sitting with Back Unsupported but Feet Supported on Floor or Stool 4  Stand to Sit 4  Transfers 3  Standing Unsupported with Eyes Closed 4  Standing Unsupported with Feet Together 0 (command following limited by cognition)  From Standing, Reach Forward with Outstretched Arm 3  From Standing Position, Pick up Object from Floor 4  From Standing Position, Turn to Look Behind Over each Shoulder 4  Turn 360 Degrees 4  Standing Unsupported, Alternately Place Feet on  Step/Stool 4  Standing Unsupported, One Foot in Front 1  Standing on One Leg 1  Total Score 44       GAIT: Distance walked: in clinic distances  Assistive device utilized: None Level of assistance: SBA Comments: generally unsteady, drifting R/L and slow gait speed  TREATMENT DATE:  10/18/23 Nustep L5, 6 min Standing exercises for balance, coordination, strength, practiced at sink in the clinic, and printed copies for her home program, see below Sit to stand from elevated mat no UE support, 5 x 2 sets Standing at base of steps, B UE support, alt toe taps to target 10 reps each Standing on airex at base of steps, modified tandem standing for 15 sec static positioning, unable to do with R leg forward Forward walking in ll bars on airex with B UE support Backwards walking in ll bars with B UE support  10/16/23  Nustep L5x10 minutes all four extremities   Objective measures as above   Education on POC, DC next visit, progress with PT       10/11/23  Checked STGs as below  Nustep L5x8 minutes all four extremities for w/u, tissue perfusion Side stepping around cone square with random direction changes max cues  Weaving in out and out of cones + cone taps (2 rounds) Attempted standing on air pads but unable to convince her to let go of // bars, she was very tickled by this task Walking outside over grass and gravel with very close min guard- moderately unsteady     10/09/23  Alternating toe taps on 4 inch step x20 Mod cues and redirection Forward stepping over half foam rolls x4 rounds in // bars Side stepping over half foam rolls x2 rounds in // bars Nustep L5x8 minutes all four extremities   Walking with 2# each LE + carrying 2.2kg ball 11ftx2      10/05/23 In parallel bars  Standing marches Standing hip abd  Cone taps  Ball squeeze  2x10 Hip abd red 2x10 Seated march red band 2x10 HS curls red 2x10  LAQ 2# 2x10 Stepping over obstacles forwards and side steps         PATIENT EDUCATION:  Education details: exam findings, POC, HEP  Person educated: Patient Education method: Programmer, multimedia, Facilities manager, Verbal cues, and Handouts Education comprehension: verbalized understanding, returned demonstration, and needs further education  HOME EXERCISE PROGRAM: Access Code: [email or web address] URL: [email or web address] Date: 10/18/2023 Prepared by: Kaelani Kendrick  Exercises - Standing Hip Extension with Counter Support  - 1 x daily - 7 x weekly - 3 sets - 10 reps - Standing Hip Flexion with Counter Support  - 1 x daily - 7 x weekly - 3 sets - 10 reps - Standing Hip Abduction with Counter Support  - 1 x daily - 7 x weekly - 3 sets - 10 reps - Mini Squat with Counter Support  - 1 x daily - 7 x weekly - 3 sets - 10 reps Access Code: [email or web address] URL: [email or web address] Date: 09/13/2023 Prepared by: Nedra Hai  Exercises - Seated March with Resistance  - 1 x daily - 7 x weekly - 2 sets - 10 reps - 2 seconds  hold - Seated Hip Abduction with Resistance  - 1 x daily - 7 x weekly - 2 sets - 10 reps - 2 seconds  hold - Sit to Stand with Arms Crossed  - 1 x daily - 7 x weekly - 2 sets - 5 reps  ASSESSMENT:  CLINICAL IMPRESSION:  The pt has completed her course of outpatient physical therapy due to falls.  Provided her with updated printed HEP today.  Her goals were assessed last visit and met or partially met.  She does have cognitive deficits which affect her judgement and increase her fall risk ongoing .  She agreed with DC plans   OBJECTIVE IMPAIRMENTS: Abnormal gait, decreased activity tolerance, decreased balance, decreased cognition, decreased coordination, decreased mobility, difficulty walking, decreased strength, and decreased safety awareness.   ACTIVITY LIMITATIONS: standing, squatting,  stairs, transfers, bathing, toileting, dressing, and locomotion level  PARTICIPATION LIMITATIONS: meal prep, cleaning, laundry, shopping, community activity, and yard work  PERSONAL FACTORS: Age, Behavior pattern, Education, Fitness, Past/current experiences, Social background, and Time since onset of injury/illness/exacerbation are also affecting patient's functional outcome.   REHAB POTENTIAL: Good  CLINICAL DECISION MAKING: Stable/uncomplicated  EVALUATION COMPLEXITY: Low   GOALS: Goals reviewed with patient? No  SHORT TERM GOALS: Target date: 10/11/2023   Will be compliant with appropriate progressive HEP  Baseline: Goal status: ONGOING 10/01/23     2.  Will be able to name 3 ways to prevent falls at home and in the community  Baseline:  Goal status: PARTIALLY MET 10/01/23 limited by cog status able to name some but needed heavy cues and prompts   3.  Will complete TUG in 10 seconds or less with LRAD, minimal unsteadiness   Baseline:  Goal status: MET  10/16/23    LONG TERM GOALS: Target date: 11/08/2023    MMT to have improved by at least one grade in all weak groups  Baseline:  Goal status: ONGOING 10/16/23  2.  Will complete 5xSTS test in 12 seconds or less to show improved mobility and strength, no posterior LOB  Baseline:  Goal status: MET 10/16/23  3.  Will score at least 48 on Berg to show reduced fall risk  Baseline:  Goal status: 10/16/23 44/56    4.  Will score 85% on ABC survey to show improved subjective status  Baseline:  Goal status: MET  10/16/23  5. Will ambulate at least 424ft in without fatigue to improve community access  Baseline: Goal status: ONGOING 10/16/23      PLAN:  PT FREQUENCY: 1-2x/week  PT DURATION: 8 weeks  PLANNED INTERVENTIONS: 97110-Therapeutic exercises, 97530- Therapeutic activity, 97112- Neuromuscular re-education, 97535- Self Care, 16109- Manual therapy, and 97116- Gait training  PLAN FOR NEXT SESSION:  DC next visit, update HEP  Soni Kegel, PT, DPT, OCS 10/18/23 11:50 AM

## 2023-11-16 ENCOUNTER — Ambulatory Visit

## 2023-11-16 VITALS — BP 118/64 | Ht 61.0 in | Wt 136.6 lb

## 2023-11-16 DIAGNOSIS — Z23 Encounter for immunization: Secondary | ICD-10-CM

## 2023-11-16 DIAGNOSIS — Z0000 Encounter for general adult medical examination without abnormal findings: Secondary | ICD-10-CM | POA: Diagnosis not present

## 2023-11-16 NOTE — Progress Notes (Signed)
 Subjective:   [name] is a 83 y.o. who presents for a Medicare Wellness preventive visit.  Visit Complete: In person  Persons Participating in Visit: Patient assisted by Tammy/daughter.  AWV Questionnaire: No: Patient Medicare AWV questionnaire was not completed prior to this visit.  Cardiac Risk Factors include: advanced age (>70men, >72 women);dyslipidemia;hypertension;sedentary lifestyle     Objective:    Today's Vitals   11/16/23 1246  BP: 118/64  Weight: 136 lb 9.6 oz (62 kg)  Height: 5\' 1"  (1.549 m)   Body mass index is 25.81 kg/m.     11/16/2023    1:18 PM 09/13/2023   11:17 AM 06/06/2022    3:26 AM 05/16/2022    7:16 PM 05/15/2022   11:42 AM 07/29/2021    3:00 PM 05/11/2021   11:00 PM  Advanced Directives  Does Patient Have a Medical Advance Directive? Yes No No No No No No  Type of Estate agent of [place];Living will        Copy of Healthcare Power of Attorney in Chart? Yes - validated most recent copy scanned in chart (See row information)        Would patient like information on creating a medical advance directive?  No - Patient declined  No - Patient declined No - Patient declined No - Patient declined No - Patient declined    Current Medications (verified) Outpatient Encounter Medications as of 11/16/2023  Medication Sig   acetaminophen  (TYLENOL ) 500 MG tablet Take 2 tablets (1,000 mg total) by mouth every 6 (six) hours. (Patient taking differently: Take 500 mg by mouth daily as needed (back pain).)   albuterol  (VENTOLIN  HFA) 108 (90 Base) MCG/ACT inhaler Inhale 2 puffs into the lungs every 6 (six) hours as needed.   amLODipine  (NORVASC ) 5 MG tablet Take 5 mg by mouth every morning.   aspirin  EC 325 MG tablet Take 1 tablet (325 mg total) by mouth 2 (two) times daily.   atorvastatin  (LIPITOR) 20 MG tablet Take 20 mg by mouth at bedtime.   Calcium  Carb-Cholecalciferol  600-10 MG-MCG TABS Take by mouth.   famotidine  (PEPCID ) 20  MG tablet Take 1 tablet (20 mg total) by mouth at bedtime.   Fluticasone -Umeclidin-Vilant (TRELEGY ELLIPTA ) 100-62.5-25 MCG/ACT AEPB Inhale 1 puff into the lungs every morning.   furosemide  (LASIX ) 20 MG tablet Take 20 mg by mouth every Monday, Wednesday, and Friday.   gabapentin  (NEURONTIN ) 400 MG capsule Take 400 mg by mouth 3 (three) times daily.   Glucosamine HCl 1500 MG TABS Take by mouth.   Iron , Ferrous Sulfate , 325 (65 Fe) MG TABS Take 325 mg by mouth daily.   lisinopril  (ZESTRIL ) 10 MG tablet Take 10 mg by mouth every morning.   meclizine  (ANTIVERT ) 12.5 MG tablet Take 1 tablet (12.5 mg total) by mouth 3 (three) times daily as needed for dizziness.   metoCLOPramide  (REGLAN ) 5 MG tablet Take 5 mg by mouth 2 (two) times daily.   mirtazapine  (REMERON ) 7.5 MG tablet Take 7.5 mg by mouth at bedtime.   oxybutynin  (DITROPAN -XL) 5 MG 24 hr tablet Take 5 mg by mouth at bedtime.   raloxifene  (EVISTA ) 60 MG tablet Take 60 mg by mouth every morning.   sertraline  (ZOLOFT ) 100 MG tablet Take 100 mg by mouth in the morning and at bedtime.   tiZANidine  (ZANAFLEX ) 2 MG tablet Take 2 mg by mouth 3 (three) times daily as needed.   Vibegron  (GEMTESA ) 75 MG TABS Take 1 tablet (75 mg total)  by mouth daily at 6 (six) AM.   No facility-administered encounter medications on file as of 11/16/2023.    Allergies (verified) Boniva [ibandronate sodium], Lyrica  [pregabalin ], Levofloxacin, and Nitrofurantoin monohyd macro   History: Past Medical History:  Diagnosis Date   Acute metabolic encephalopathy 06/02/2019   Adenocarcinoma of right lung, stage 1 (HCC) 07/28/2016   Anxiety    Cancer (HCC)    uterine   Closed fracture of left distal radius 06/13/2020   COPD (chronic obstructive pulmonary disease) (HCC)    Coronary artery disease    mild nonobstructive by 2019 cath   Depression    GERD (gastroesophageal reflux disease)    Hyperlipidemia    Hypertension    Low back pain    Osteoarthritis     Osteoporosis    Takotsubo cardiomyopathy    EF recovered to 50-55% by 01/23/18 echo   Vitamin D  insufficiency 06/13/2020   Past Surgical History:  Procedure Laterality Date   ANKLE SURGERY Right    horse accident   APPENDECTOMY     BACK SURGERY     CHOLECYSTECTOMY     EYE SURGERY     lense implant   FRACTURE SURGERY     HIP SURGERY  01/2010   Left Hip    INNER EAR SURGERY Bilateral 1970   related to severe ear infections   KNEE SURGERY     Right knee   LEFT HEART CATH AND CORONARY ANGIOGRAPHY N/A 12/11/2017   Procedure: LEFT HEART CATH AND CORONARY ANGIOGRAPHY;  Surgeon: Lucendia Rusk, MD;  Location: [facility];  Service: Cardiovascular;  Laterality: N/A;   LOBECTOMY Right 05/09/2016   Procedure: RIGHT UPPER LOBECTOMY;  Surgeon: Zelphia Higashi, MD;  Location: [facility] OR;  Service: Thoracic;  Laterality: Right;   MASS EXCISION  08/25/2011   Procedure: EXCISION MASS;  Surgeon: Rogena Class, MD;  Location:  SURGERY CENTER;  Service: General;  Laterality: N/A;  excision left neck mass   OPEN REDUCTION INTERNAL FIXATION (ORIF) DISTAL RADIAL FRACTURE Left 06/12/2020   Procedure: OPEN REDUCTION INTERNAL FIXATION (ORIF) DISTAL RADIAL FRACTURE;  Surgeon: Laneta Pintos, MD;  Location: [facility] OR;  Service: Orthopedics;  Laterality: Left;   TOTAL ABDOMINAL HYSTERECTOMY     TOTAL KNEE ARTHROPLASTY Right 05/27/2019   Procedure: TOTAL KNEE ARTHROPLASTY;  Surgeon: Christie Cox, MD;  Location: [facility] ORS;  Service: Orthopedics;  Laterality: Right;  75 mins needed for length of case   TOTAL KNEE ARTHROPLASTY Left 12/30/2019   Procedure: TOTAL KNEE ARTHROPLASTY;  Surgeon: Christie Cox, MD;  Location: [facility] ORS;  Service: Orthopedics;  Laterality: Left;   VIDEO ASSISTED THORACOSCOPY (VATS)/WEDGE RESECTION Right 05/09/2016   Procedure: VIDEO ASSISTED THORACOSCOPY (VATS)/WEDGE RESECTION;  Surgeon: Zelphia Higashi, MD;  Location: [facility] OR;  Service: Thoracic;  Laterality: Right;    Family History  Problem Relation Age of Onset   Heart disease Mother    Heart disease Father    Cancer Sister        #1, breast   Heart disease Brother        had CABG   Social History   Socioeconomic History   Marital status: Widowed    Spouse name: Not on file   Number of children: Not on file   Years of education: Not on file   Highest education level: Not on file  Occupational History   Not on file  Tobacco Use   Smoking status: Former    Current packs/day: 0.00  Average packs/day: 2.0 packs/day for 51.0 years (102.0 ttl pk-yrs)    Types: Cigarettes    Start date: 05/04/1961    Quit date: 05/04/2012    Years since quitting: 11.5   Smokeless tobacco: Never   Tobacco comments:    Started smoking at 83 years old.    Smoked 1 PPD  Vaping Use   Vaping status: Former   Quit date: 12/01/2017  Substance and Sexual Activity   Alcohol use: No    Alcohol/week: 0.0 standard drinks of alcohol   Drug use: No   Sexual activity: Yes    Birth control/protection: Other-see comments  Other Topics Concern   Not on file  Social History Narrative   Single, lives alone with 2 dogs   3 children   Retired: several careers included Neurosurgeon, diners, truck stops   Works now on the weekends at [employer]: Low Risk  (11/16/2023)   Overall Financial Resource Strain (CARDIA)    Difficulty of Paying Living Expenses: Not hard at all  Food Insecurity: No Food Insecurity (11/16/2023)   Hunger Vital Sign    Worried About Running Out of Food in the Last Year: Never true    Ran Out of Food in the Last Year: Never true  Transportation Needs: No Transportation Needs (11/16/2023)   PRAPARE - Administrator, Civil Service (Medical): No    Lack of Transportation (Non-Medical): No  Physical Activity: Inactive (11/16/2023)   Exercise Vital Sign    Days of Exercise per Week: 0 days    Minutes of Exercise per Session: 0 min   Stress: No Stress Concern Present (11/16/2023)   [employer] of Occupational Health - Occupational Stress Questionnaire    Feeling of Stress : Not at all  Social Connections: Socially Isolated (11/16/2023)   Social Connection and Isolation Panel [NHANES]    Frequency of Communication with Friends and Family: Three times a week    Frequency of Social Gatherings with Friends and Family: Once a week    Attends Religious Services: Never    Database administrator or Organizations: No    Attends Banker Meetings: Never    Marital Status: Widowed    Tobacco Counseling Counseling given: Not Answered Tobacco comments: Started smoking at 83 years old. Smoked 1 PPD    Clinical Intake:  Pre-visit preparation completed: Yes  Pain : No/denies pain     BMI - recorded: 25.81 Nutritional Status: BMI 25 -29 Overweight Nutritional Risks: Nausea/ vomitting/ diarrhea Diabetes: No  Lab Results  Component Value Date   HGBA1C 5.3 08/29/2015     How often do you need to have someone help you when you read instructions, pamphlets, or other written materials from your doctor or pharmacy?: 1 - Never  Interpreter Needed?: No  Comments: brother lives with pt Information entered by :: B.Ynez Eugenio,LPN   Activities of Daily Living     11/16/2023    1:19 PM  In your present state of health, do you have any difficulty performing the following activities:  Hearing? 1  Vision? 1  Difficulty concentrating or making decisions? 1  Walking or climbing stairs? 1  Dressing or bathing? 0  Doing errands, shopping? 1  Comment does not drive  Preparing Food and eating ? Y  Using the Toilet? N  In the past six months, have you accidently leaked urine? N  Do you have problems with loss of bowel  control? N  Managing your Medications? Y  Managing your Finances? Y  Housekeeping or managing your Housekeeping? Y    Patient Care Team: Dorothe Gaster, NP as PCP - General (Nurse  Practitioner) Lenise Quince, MD as PCP - Cardiology (Cardiology)  Indicate any recent Medical Services you may have received from other than [facility] providers in the past year (date may be approximate).     Assessment:   This is a routine wellness examination for [facility].  Hearing/Vision screen Hearing Screening - Comments:: Pt says her hearing is not good Audiology referral Vision Screening - Comments:: Pt says vision is good w/glasses   Goals Addressed             This Visit's Progress    Patient Stated       I would like to manage reflux better       Depression Screen     11/16/2023    1:07 PM 08/30/2023   10:46 AM 02/20/2023    2:56 PM 06/11/2018   11:32 AM 05/24/2018   10:28 AM 05/03/2018    9:01 AM 04/11/2018    1:12 PM  PHQ 2/9 Scores  PHQ - 2 Score 2 6 5  0 0 1 6  PHQ- 9 Score 9 18 5    16   Exception Documentation   --        Fall Risk     11/16/2023   12:59 PM 08/30/2023   10:46 AM 06/11/2018   11:32 AM 05/24/2018   10:28 AM 05/03/2018    9:01 AM  Fall Risk   Falls in the past year? 0 1 0 No No  Number falls in past yr: 0 1     Injury with Fall? 0 0     Risk for fall due to : No Fall Risks History of fall(s)     Follow up Education provided;Falls prevention discussed        MEDICARE RISK AT HOME:  Medicare Risk at Home Any stairs in or around the home?: Yes If so, are there any without handrails?: Yes Home free of loose throw rugs in walkways, pet beds, electrical cords, etc?: Yes Adequate lighting in your home to reduce risk of falls?: Yes Life alert?: No Use of a cane, walker or w/c?: Yes (cane and walker) Grab bars in the bathroom?: Yes Shower chair or bench in shower?: Yes Elevated toilet seat or a handicapped toilet?: Yes  TIMED UP AND GO:  Was the test performed?  Yes  Length of time to ambulate 10 feet: 12 sec Gait slow and steady without use of assistive device  Cognitive Function: 6CIT completed        11/16/2023    1:21 PM  6CIT  Screen  What Year? 0 points  What month? 0 points  What time? 0 points  Count back from 20 4 points  Months in reverse 4 points  Repeat phrase 2 points  Total Score 10 points    Immunizations Immunization History  Administered Date(s) Administered   Fluad Trivalent(High Dose 65+) 04/05/2023   Influenza Split 05/26/2015   Influenza-Unspecified 04/27/2016   PFIZER(Purple Top)SARS-COV-2 Vaccination 12/13/2019, 02/07/2020   PNEUMOCOCCAL CONJUGATE-20 11/16/2023    Screening Tests Health Maintenance  Topic Date Due   DTaP/Tdap/Td (1 - Tdap) Never done   Zoster Vaccines- Shingrix (1 of 2) Never done   DEXA SCAN  Never done   COVID-19 Vaccine (3 - Pfizer risk series) 03/06/2020   INFLUENZA VACCINE  02/23/2024  Medicare Annual Wellness (AWV)  11/15/2024   Pneumonia Vaccine 64+ Years old  Completed   HPV VACCINES  Aged Out   Meningococcal B Vaccine  Aged Out    Health Maintenance  Health Maintenance Due  Topic Date Due   DTaP/Tdap/Td (1 - Tdap) Never done   Zoster Vaccines- Shingrix (1 of 2) Never done   DEXA SCAN  Never done   COVID-19 Vaccine (3 - Pfizer risk series) 03/06/2020   Health Maintenance Items Addressed: Prevnar 20 given  Additional Screening:  Vision Screening: Recommended annual ophthalmology exams for early detection of glaucoma and other disorders of the eye.  Dental Screening: Recommended annual dental exams for proper oral hygiene  Community Resource Referral / Chronic Care Management: CRR required this visit?  No   CCM required this visit?  No    Plan:     I have personally reviewed and noted the following in the patient's chart:   Medical and social history Use of alcohol, tobacco or illicit drugs  Current medications and supplements including opioid prescriptions. Patient is not currently taking opioid prescriptions. Functional ability and status Nutritional status Physical activity Advanced directives List of other  physicians Hospitalizations, surgeries, and ER visits in previous 12 months Vitals Screenings to include cognitive, depression, and falls Referrals and appointments  In addition, I have reviewed and discussed with patient certain preventive protocols, quality metrics, and best practice recommendations. A written personalized care plan for preventive services as well as general preventive health recommendations were provided to patient.    Nerissa Bannister, LPN   [id number]   After Visit Summary: (Declined) Due to this being a telephonic visit, with patients personalized plan was offered to patient but patient Declined AVS at this time   Notes: Please refer to Routing Comments.

## 2023-11-16 NOTE — Patient Instructions (Addendum)
 [name] , Thank you for taking time to come for your Medicare Wellness Visit. I appreciate your ongoing commitment to your health goals. Please review the following plan we discussed and let me know if I can assist you in the future.   Referrals/Orders/Follow-Ups/Clinician Recommendations:   Pneumonia Vaccine Status: patient given pneumonia vaccine   This is a list of the screening recommended for you and due dates:  Health Maintenance  Topic Date Due   DTaP/Tdap/Td vaccine (1 - Tdap) Never done   Pneumonia Vaccine (1 of 2 - PCV) Never done   Zoster (Shingles) Vaccine (1 of 2) Never done   DEXA scan (bone density measurement)  Never done   COVID-19 Vaccine (3 - Pfizer risk series) 03/06/2020   Flu Shot  02/23/2024   Medicare Annual Wellness Visit  11/15/2024   HPV Vaccine  Aged Out   Meningitis B Vaccine  Aged Out    Advanced directives: (In Chart) A copy of your advanced directives are scanned into your chart should your provider ever need it.  Next Medicare Annual Wellness Visit scheduled for next year: Yes 11/19/24 @ 3:40pm in person

## 2023-11-27 ENCOUNTER — Ambulatory Visit (INDEPENDENT_AMBULATORY_CARE_PROVIDER_SITE_OTHER): Payer: 59 | Admitting: Nurse Practitioner

## 2023-11-27 VITALS — BP 124/86 | HR 86 | Temp 97.6°F | Ht 61.0 in | Wt 137.0 lb

## 2023-11-27 DIAGNOSIS — R1013 Epigastric pain: Secondary | ICD-10-CM | POA: Diagnosis not present

## 2023-11-27 DIAGNOSIS — K219 Gastro-esophageal reflux disease without esophagitis: Secondary | ICD-10-CM

## 2023-11-27 DIAGNOSIS — R42 Dizziness and giddiness: Secondary | ICD-10-CM

## 2023-11-27 DIAGNOSIS — Z711 Person with feared health complaint in whom no diagnosis is made: Secondary | ICD-10-CM | POA: Diagnosis not present

## 2023-11-27 DIAGNOSIS — M25531 Pain in right wrist: Secondary | ICD-10-CM | POA: Diagnosis not present

## 2023-11-27 MED ORDER — FAMOTIDINE 20 MG PO TABS: 20.0000 mg | ORAL_TABLET | Freq: Two times a day (BID) | ORAL | 0 refills | Status: DC | PRN

## 2023-11-27 NOTE — Patient Instructions (Signed)
 Nice to see you today I have sent in some pepcid  20mg . Try to do it just at bedtime but if you need it in the morning that is ok to do also Follow up with me in approx 4 months for your physical and labs

## 2023-11-27 NOTE — Assessment & Plan Note (Signed)
 History of same that responded well to Pepcid  20 mg daily.  New prescription sent in today

## 2023-11-27 NOTE — Assessment & Plan Note (Signed)
 Seems to be stable.  MRI of the brain unremarkable in regards to causation.  Amatory referral to neurology

## 2023-11-27 NOTE — Progress Notes (Signed)
 Established Patient Office Visit  Subjective   Patient ID: [name], female    DOB: 06-07-1941  Age: 83 y.o. MRN: [id number]  Chief Complaint  Patient presents with   Follow-up    Dizziness and memory check. Pt complains of still feeling dizzy. Pt is unsure when to take medication. Pt memory has remained the same since last visit.    Medication Refill    Pepcid . Pt states she has been out and has not been able to get from pharmacy.      HPI   Dizziness: patient has had continued complaints of dizziness. We have adjsuted her blood pressure medciation without resolve. Most recently we did a MRI of brain and recommended patient to see neurology   Heartburn: states that she did a short script of pepcid . States that she was doing omeprazole that has not helped. States that she is having acid to the pint where she is throwing. States that it is worse at night. States that it can be with laying down or sitting up. State that she iwll eat before 3pm and then will go to bed around 530-6.    Review of Systems  Constitutional:  Negative for chills and fever.  Respiratory:  Negative for shortness of breath.   Cardiovascular:  Negative for chest pain.  Gastrointestinal:  Positive for heartburn, nausea and vomiting. Negative for abdominal pain, constipation and diarrhea.       BM daily   Neurological:  Negative for headaches.      Objective:     BP 124/86   Pulse 86   Temp 97.6 F (36.4 C) (Oral)   Ht 5\' 1"  (1.549 m)   Wt 137 lb (62.1 kg)   SpO2 96%   BMI 25.89 kg/m  BP Readings from Last 3 Encounters:  11/27/23 124/86  11/16/23 118/64  10/12/23 (!) 147/82   Wt Readings from Last 3 Encounters:  11/27/23 137 lb (62.1 kg)  11/16/23 136 lb 9.6 oz (62 kg)  10/12/23 136 lb (61.7 kg)   SpO2 Readings from Last 3 Encounters:  11/27/23 96%  08/30/23 91%  08/21/23 98%      Physical Exam Vitals and nursing note reviewed.  Constitutional:      Appearance: Normal  appearance.  Cardiovascular:     Rate and Rhythm: Normal rate and regular rhythm.     Heart sounds: Normal heart sounds.  Pulmonary:     Effort: Pulmonary effort is normal.     Breath sounds: Normal breath sounds.  Abdominal:     General: Bowel sounds are normal. There is no distension.     Palpations: There is no mass.     Tenderness: There is abdominal tenderness in the epigastric area.     Hernia: No hernia is present.  Neurological:     Mental Status: She is alert.      No results found for any visits on 11/27/23.    The ASCVD Risk score (Arnett DK, et al., 2019) failed to calculate for the following reasons:   The 2019 ASCVD risk score is only valid for ages 57 to 61   Risk score cannot be calculated because patient has a medical history suggesting prior/existing ASCVD    Assessment & Plan:   Problem List Items Addressed This Visit       Digestive   GERD (gastroesophageal reflux disease)   History of same that responded well to Pepcid  20 mg daily.  New prescription sent in today  Relevant Medications   famotidine  (PEPCID ) 20 MG tablet     Other   Dizziness   Longstanding without improvement.  MRI brain no blatant cause.  Ambulatory referral to neurology today      Relevant Orders   Ambulatory referral to Neurology   Concern about memory   Seems to be stable.  MRI of the brain unremarkable in regards to causation.  Amatory referral to neurology      Relevant Orders   Ambulatory referral to Neurology   Right wrist pain - Primary   History of the same with fixation surgery in the past.  Area is becoming more painful and larger.  We did do x-ray last office visit ambulatory referral to Ortho for further evaluation and workup      Relevant Orders   Ambulatory referral to Orthopedic Surgery   Epigastric pain   Oriented famotidine  20 mg daily.  Will write up to twice daily as needed if needed      Relevant Medications   famotidine  (PEPCID ) 20 MG  tablet    Return in about 4 months (around 03/29/2024) for CPE and Labs.    Margarie Shay, NP

## 2023-11-27 NOTE — Assessment & Plan Note (Signed)
 Oriented famotidine  20 mg daily.  Will write up to twice daily as needed if needed

## 2023-11-27 NOTE — Assessment & Plan Note (Signed)
 History of the same with fixation surgery in the past.  Area is becoming more painful and larger.  We did do x-ray last office visit ambulatory referral to Ortho for further evaluation and workup

## 2023-11-27 NOTE — Assessment & Plan Note (Signed)
 Longstanding without improvement.  MRI brain no blatant cause.  Ambulatory referral to neurology today

## 2023-11-28 ENCOUNTER — Encounter: Payer: Self-pay | Admitting: Physician Assistant

## 2023-12-07 ENCOUNTER — Ambulatory Visit: Payer: 59 | Admitting: Pulmonary Disease

## 2023-12-07 ENCOUNTER — Encounter: Payer: Self-pay | Admitting: Pulmonary Disease

## 2023-12-07 VITALS — BP 110/78 | HR 71 | Temp 97.1°F | Ht 61.0 in | Wt 136.4 lb

## 2023-12-07 DIAGNOSIS — R0602 Shortness of breath: Secondary | ICD-10-CM

## 2023-12-07 NOTE — Progress Notes (Signed)
 Synopsis: Referred in by Dorothe Gaster, NP   Subjective:   PATIENT ID: [name] GENDER: female DOB: 06-26-1941, MRN: [id number]  Chief Complaint  Patient presents with   Follow-up    DOE. Wheezing. Cough with white sputum.    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  She reports that she has been doing well on Trelegy Ellipta .  Has been using her albuterol  inhaler only as needed however sometimes she needs to use it 4 times a day.  Her main complaint today was dizziness and this happens when she moves her head quickly.  Chest x-ray in 2023 with no acute findings.  12/07/2023 OV - Patient doing well overall. Compliant with Trelegy Ellipta . Using her rescue inhaler couple of times a day.   ROS All systems were reviewed and are negative except for the above.  Objective:   Vitals:   12/07/23 1307  BP: 110/78  Pulse: 71  Temp: (!) 97.1 F (36.2 C)  SpO2: 95%  Weight: 136 lb 6.4 oz (61.9 kg)  Height: 5\' 1"  (1.549 m)   95% on RA BMI Readings from Last 3 Encounters:  12/07/23 25.77 kg/m  11/27/23 25.89 kg/m  11/16/23 25.81 kg/m   Wt Readings from Last 3 Encounters:  12/07/23 136 lb 6.4 oz (61.9 kg)  11/27/23 137 lb (62.1 kg)  11/16/23 136 lb 9.6 oz (62 kg)    Physical Exam GEN: NAD, Healthy Appearing HEENT: Supple Neck, Reactive Pupils, EOMI  CVS: Normal S1, Normal S2, RRR, No murmurs or ES appreciated  Lungs: Clear bilateral air entry.  Abdomen: Soft, non tender, non distended, + BS  Extremities: Warm and well perfused, No edema  Skin: No suspicious lesions appreciated  Psych: Normal Affect  Ancillary Information   CBC    Component Value Date/Time   WBC 7.8 10/04/2023 0947   RBC 4.34 10/04/2023 0947   HGB 10.5 (L) 10/04/2023 0947   HGB 12.8 07/30/2018 1123   HGB 12.5 01/17/2018 0853   HGB 13.3  07/27/2017 0939   HCT 33.0 (L) 10/04/2023 0947   HCT 37.9 01/17/2018 0853   HCT 39.3 07/27/2017 0939   PLT 453.0 (H) 10/04/2023 0947   PLT 265 07/30/2018 1123   PLT 308 01/17/2018 0853   MCV 76.0 (L) 10/04/2023 0947   MCV 88 01/17/2018 0853   MCV 90.5 07/27/2017 0939   MCH 26.1 05/16/2022 2244   MCHC 31.7 10/04/2023 0947   RDW 19.3 (H) 10/04/2023 0947   RDW 15.3 01/17/2018 0853   RDW 13.8 07/27/2017 0939   LYMPHSABS 1.8 05/16/2022 2244   LYMPHSABS 1.5 07/27/2017 0939   MONOABS 0.6 05/16/2022 2244   MONOABS 0.4 07/27/2017 0939   EOSABS 0.1 05/16/2022 2244   EOSABS 0.1 07/27/2017 0939   BASOSABS 0.1 05/16/2022 2244   BASOSABS 0.0 07/27/2017 0939   Labs and imaging were reviewed.     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  #Stage II COPD gpB FEV-1 69% of predicied.   []  C/w Trelegy ellipta  100 1puff once a day.  []  C/w Albuterol  as needed.  []  Obtain PFTs.  []  Will consider ensifentrine  on next visit.  []  CT chest wo contrast given ongoing sob, with unilateral wheezing on exam and history of smoking.    RTC 6 monhts.   I spent 30 minutes caring for this patient today, including preparing to see the patient, obtaining a medical history , reviewing a separately obtained history, performing a medically appropriate examination and/or evaluation,  counseling and educating the patient/family/caregiver, documenting clinical information in the electronic health record, and independently interpreting results (not separately reported/billed) and communicating results to the patient/family/caregiver  Annitta Kindler, MD [facility] 12/07/2023 1:13 PM

## 2024-01-11 ENCOUNTER — Encounter: Payer: Self-pay | Admitting: Physician Assistant

## 2024-01-28 NOTE — Progress Notes (Signed)
 Assessment/Plan:     [name] is a very pleasant 83 y.o. year old RH female with a history of hypertension, hyperlipidemia, COPD, benign positional vertigo with chronic dizziness, GERD, seen today for evaluation of memory loss. Unable to perform MoCA, MMSE is 23/30 . Etiology likely multifactorial, although here are significant advanced chronic microvascular changes without acute findings on most recent MRI of the brain. Discussed starting donepezil  5 mg daily (goal 10 mg daily), patient agrees. Patient is able to participate on ADLs, no longer drives. Mood is controlled.     Memory Impairment likely multifactorial etiology, concern for vascular causes    Start donepezil  5 mg daily, side effects discussed.  Goal to increase to 10 mg daily Replenish B12 Recommend following with PCP/GI on iron  deficiency anemia Recommend good control of cardiovascular risk factors.   Continue to control mood as per PCP Continue PT OT Recommend checking her hearing in an effort to improve comprehension Folllow up in 6 months   Subjective:    The patient is here alone    How long did patient have memory difficulties? I don't know.  Patient reports some difficulty remembering new information, recent conversations, names. LTM is reported to be good. Not very active, does not do any brain stimulating exercises.  repeats oneself?  Endorsed Disoriented when walking into a room? Denies    Leaving objects in unusual places?  Denies.   Wandering behavior? Denies.   Any personality changes, or depression, anxiety? Depression after the death of her mother and her son. She is caring for one daughter with Cancer.  Hallucinations or paranoia? Denies.   Seizures? Denies.    Any sleep changes?  Sleeps well.  Denies frequent nightmares or dream reenactment, other REM behavior or sleepwalking   Sleep apnea? Denies.   Any hygiene concerns?  Denies.   Independent of bathing and dressing? Endorsed  Does  the patient need help with medications? Daughter is in charge  Who is in charge of the finances? Patient is in charge    Any changes in appetite?   Denies.     Patient have trouble swallowing?  Denies.   Does the patient cook   yes, denies forgetting common recipes or kitchen accidents   Any headaches?  Denies.   Chronic pain? Denies.   Ambulates with difficulty? Denies.  Recent falls or head injuries? Denies.  Vision changes?  Denies any new issues.  Any strokelike symptoms? Denies.   Any tremors? Denies.   Any anosmia? Denies.   Any incontinence of urine?  She has OAB Any bowel dysfunction? Denies.      Patient lives alone  History of heavy alcohol intake? Denies.   History of heavy tobacco use? Quit 6 years  ago  Family history of dementia?   Does not know, she is adopted.  Does patient drive? 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  Allergies  Allergen Reactions   Boniva [Ibandronate Sodium] Other (See Comments)    Upset stomach   Lyrica  [Pregabalin ] Nausea And Vomiting    Dizziness and blurred vision as well   Levofloxacin Other (See Comments)  Behavior changes   Nitrofurantoin Monohyd Macro Rash    Reaction to Macrobid    Current Outpatient Medications  Medication Instructions   acetaminophen  (TYLENOL ) 1,000 mg, Oral, Every 6 hours   albuterol  (VENTOLIN  HFA) 108 (90 Base) MCG/ACT inhaler 2 puffs, Inhalation, Every 6 hours PRN   amLODipine  (NORVASC ) 5 mg, Every morning   aspirin  EC 325 MG tablet Take 1 tablet (325 mg total) by mouth 2 (two) times daily.   atorvastatin   (LIPITOR) 20 mg, Daily at bedtime   Calcium  Carb-Cholecalciferol  600-10 MG-MCG TABS Take by mouth.   donepezil  (ARICEPT ) 5 mg, Oral, Daily   famotidine  (PEPCID ) 20 mg, Oral, 2 times daily PRN   Fluticasone -Umeclidin-Vilant (TRELEGY ELLIPTA ) 100-62.5-25 MCG/ACT AEPB 1 puff, Inhalation, Every morning   furosemide  (LASIX ) 20 mg, Every M-W-F   gabapentin  (NEURONTIN ) 400 mg, 3 times daily   Gemtesa  75 mg, Oral, Daily   Glucosamine HCl 1500 MG TABS Take by mouth.   Iron  (Ferrous Sulfate ) 325 mg, Oral, Daily   lisinopril  (ZESTRIL ) 10 mg, Every morning   meclizine  (ANTIVERT ) 12.5 mg, Oral, 3 times daily PRN   metoCLOPramide  (REGLAN ) 5 mg, 2 times daily   mirtazapine  (REMERON ) 7.5 mg, Daily at bedtime   oxybutynin  (DITROPAN -XL) 5 mg, Daily at bedtime   raloxifene  (EVISTA ) 60 mg, Every morning   sertraline  (ZOLOFT ) 100 mg, 2 times daily   tiZANidine  (ZANAFLEX ) 2 mg, 3 times daily PRN     VITALS:   Vitals:   01/31/24 1309  BP: (!) 142/91  Pulse: 89  Resp: 20  SpO2: 100%  Weight: 129 lb (58.5 kg)  Height: 5' 1 (1.549 m)     Physical Exam  :     No data to display             01/31/2024    2:00 PM  MMSE - Mini Mental State Exam  Orientation to time 3  Orientation to Place 4  Registration 3  Attention/ Calculation 4  Recall 2  Language- name 2 objects 2  Language- repeat 1  Language- follow 3 step command 3  Language- read & follow direction 1  Write a sentence 0  Copy design 0  Total score 23       HEENT:  Normocephalic, atraumatic.  The superficial temporal arteries are without ropiness or tenderness. Cardiovascular: Regular rate and rhythm. Lungs: Clear to auscultation bilaterally. Neck: There are no carotid bruits noted bilaterally. Orientation:  Alert and oriented to person, place and not to time. No aphasia or dysarthria. Fund of knowledge is appropriate. Recent and remote memory impaired.  Attention and concentration are reduced .  Able to name objects and  repeat phrases.   Delayed recall 2 /3 Cranial nerves: There is good facial symmetry. Extraocular muscles are intact and visual fields are full to confrontational testing. Edentulous. Speech is fluent and clear. No tongue deviation. Hearing is decreased to conversational tone.  Tone: Tone is good throughout. Sensation: Sensation is intact to light touch.  Vibration is intact at the bilateral big toe.  Coordination: The patient has no difficulty with RAM's or FNF bilaterally. Normal finger to nose  Motor: Strength is 5/5 in the bilateral upper and lower extremities. There is no pronator drift. There are no fasciculations noted. DTR's: Deep tendon reflexes are 2/4 bilaterally. Gait and Station: The patient is able to ambulate without difficulty. Gait is cautious and narrow. Stride length is normal.        Thank you for allowing us  the  opportunity to participate in the care of this nice patient. Please do not hesitate to contact us  for any questions or concerns.   Total time spent on today's visit was 35 minutes dedicated to this patient today, preparing to see patient, examining the patient, ordering tests and/or medications and counseling the patient, documenting clinical information in the EHR or other health record, independently interpreting results and communicating results to the patient/family, discussing treatment and goals, answering patient's questions and coordinating care.  Cc:  Wendee Lynwood HERO, NP  Camie Sevin 01/31/2024 2:18 PM

## 2024-01-31 ENCOUNTER — Ambulatory Visit (INDEPENDENT_AMBULATORY_CARE_PROVIDER_SITE_OTHER): Admitting: Physician Assistant

## 2024-01-31 ENCOUNTER — Ambulatory Visit

## 2024-01-31 ENCOUNTER — Encounter: Payer: Self-pay | Admitting: Physician Assistant

## 2024-01-31 VITALS — BP 142/91 | HR 89 | Resp 20 | Ht 61.0 in | Wt 129.0 lb

## 2024-01-31 DIAGNOSIS — R413 Other amnesia: Secondary | ICD-10-CM

## 2024-01-31 MED ORDER — DONEPEZIL HCL 5 MG PO TABS: 5.0000 mg | ORAL_TABLET | Freq: Every day | ORAL | 11 refills | Status: AC

## 2024-01-31 NOTE — Patient Instructions (Signed)
 It was a pleasure to see you today at our office.   Recommendations:     We will start donepezil   5 mg daily, call back before running oiut of medicine in 3 months and will refill at a higher dose Follow up in 6 month s.    Recommend visiting the website :  Dementia Success Path to better understand some behaviors related to memory loss.  For psychiatric meds, mood meds: Please have your primary care physician manage these medications.  If you have any severe symptoms of a stroke, or other severe issues such as confusion,severe chills or fever, etc call 911 or go to the ER as you may need to be evaluated further For guidance regarding [facility] and if placement were needed at the facility, contact Social Worker tel: [phone]  For assessment of decision of mental capacity and competency:  Call Dr. Rosaline Nine, geriatric psychiatrist at [phone] Counseling regarding caregiver distress, including caregiver depression, anxiety and issues regarding community resources, adult day care programs, adult living facilities, or memory care questions:  please contact your  Primary Doctor's Social Worker      [email or web address]   RECOMMENDATIONS FOR ALL PATIENTS WITH MEMORY PROBLEMS: 1. Continue to exercise (Recommend 30 minutes of walking everyday, or 3 hours every week) 2. Increase social interactions - continue going to [place] and enjoy social gatherings with friends and family 3. Eat healthy, avoid fried foods and eat more fruits and vegetables 4. Maintain adequate blood pressure, blood sugar, and blood cholesterol level. Reducing the risk of stroke and cardiovascular disease also helps promoting better memory. 5. Avoid stressful situations. Live a simple life and avoid aggravations. Organize your time and prepare for the next day in anticipation. 6. Sleep well, avoid any interruptions of sleep and avoid any  distractions in the bedroom that may interfere with adequate sleep quality 7. Avoid sugar, avoid sweets as there is a strong link between excessive sugar intake, diabetes, and cognitive impairment We discussed the Mediterranean diet, which has been shown to help patients reduce the risk of progressive memory disorders and reduces cardiovascular risk. This includes eating fish, eat fruits and green leafy vegetables, nuts like almonds and hazelnuts, walnuts, and also use olive oil. Avoid fast foods and fried foods as much as possible. Avoid sweets and sugar as sugar use has been linked to worsening of memory function.  There is always a concern of gradual progression of memory problems. If this is the case, then we may need to adjust level of care according to patient needs. Support, both to the patient and caregiver, should then be put into place.         DRIVING: Regarding driving, in patients with progressive memory problems, driving will be impaired. We advise to have someone else do the driving if trouble finding directions or if minor accidents are reported. Independent driving assessment is available to determine safety of driving.   If you are interested in the driving assessment, you can contact the following:  [employer] in [place] [phone]  [facility] [phone]  [facility] [phone]  [facility] [phone] or [phone]   FALL PRECAUTIONS: Be cautious when walking. Scan the area for obstacles that may increase the risk of trips and falls. When getting up in the mornings, sit up at the edge of the bed for a few minutes before getting out of bed. Consider elevating the bed at the head end to avoid drop of blood  pressure when getting up. Walk always in a well-lit room (use night lights in the walls). Avoid area rugs or power cords from appliances in the middle of the walkways. Use a walker or a cane if necessary and  consider physical therapy for balance exercise. Get your eyesight checked regularly.  FINANCIAL OVERSIGHT: Supervision, especially oversight when making financial decisions or transactions is also recommended.  HOME SAFETY: Consider the safety of the kitchen when operating appliances like stoves, microwave oven, and blender. Consider having supervision and share cooking responsibilities until no longer able to participate in those. Accidents with firearms and other hazards in the house should be identified and addressed as well.   ABILITY TO BE LEFT ALONE: If patient is unable to contact 911 operator, consider using LifeLine, or when the need is there, arrange for someone to stay with patients. Smoking is a fire hazard, consider supervision or cessation. Risk of wandering should be assessed by caregiver and if detected at any point, supervision and safe proof recommendations should be instituted.  MEDICATION SUPERVISION: Inability to self-administer medication needs to be constantly addressed. Implement a mechanism to ensure safe administration of the medications.      Mediterranean Diet A Mediterranean diet refers to food and lifestyle choices that are based on the traditions of countries located on the [employer]. This way of eating has been shown to help prevent certain conditions and improve outcomes for people who have chronic diseases, like kidney disease and heart disease. What are tips for following this plan? Lifestyle  Cook and eat meals together with your family, when possible. Drink enough fluid to keep your urine clear or pale yellow. Be physically active every day. This includes: Aerobic exercise like running or swimming. Leisure activities like gardening, walking, or housework. Get 7-8 hours of sleep each night. If recommended by your health care provider, drink red wine in moderation. This means 1 glass a day for nonpregnant women and 2 glasses a day for men. A glass  of wine equals 5 oz (150 mL). Reading food labels  Check the serving size of packaged foods. For foods such as rice and pasta, the serving size refers to the amount of cooked product, not dry. Check the total fat in packaged foods. Avoid foods that have saturated fat or trans fats. Check the ingredients list for added sugars, such as corn syrup. Shopping  At the grocery store, buy most of your food from the areas near the walls of the store. This includes: Fresh fruits and vegetables (produce). Grains, beans, nuts, and seeds. Some of these may be available in unpackaged forms or large amounts (in bulk). Fresh seafood. Poultry and eggs. Low-fat dairy products. Buy whole ingredients instead of prepackaged foods. Buy fresh fruits and vegetables in-season from local farmers markets. Buy frozen fruits and vegetables in resealable bags. If you do not have access to quality fresh seafood, buy precooked frozen shrimp or canned fish, such as tuna, salmon, or sardines. Buy small amounts of raw or cooked vegetables, salads, or olives from the deli or salad bar at your store. Stock your pantry so you always have certain foods on hand, such as olive oil, canned tuna, canned tomatoes, rice, pasta, and beans. Cooking  Cook foods with extra-virgin olive oil instead of using butter or other vegetable oils. Have meat as a side dish, and have vegetables or grains as your main dish. This means having meat in small portions or adding small amounts of meat to foods like pasta  or stew. Use beans or vegetables instead of meat in common dishes like chili or lasagna. Experiment with different cooking methods. Try roasting or broiling vegetables instead of steaming or sauteing them. Add frozen vegetables to soups, stews, pasta, or rice. Add nuts or seeds for added healthy fat at each meal. You can add these to yogurt, salads, or vegetable dishes. Marinate fish or vegetables using olive oil, lemon juice, garlic, and  fresh herbs. Meal planning  Plan to eat 1 vegetarian meal one day each week. Try to work up to 2 vegetarian meals, if possible. Eat seafood 2 or more times a week. Have healthy snacks readily available, such as: Vegetable sticks with hummus. Greek yogurt. Fruit and nut trail mix. Eat balanced meals throughout the week. This includes: Fruit: 2-3 servings a day Vegetables: 4-5 servings a day Low-fat dairy: 2 servings a day Fish, poultry, or lean meat: 1 serving a day Beans and legumes: 2 or more servings a week Nuts and seeds: 1-2 servings a day Whole grains: 6-8 servings a day Extra-virgin olive oil: 3-4 servings a day Limit red meat and sweets to only a few servings a month What are my food choices? Mediterranean diet Recommended Grains: Whole-grain pasta. Brown rice. Bulgar wheat. Polenta. Couscous. Whole-wheat bread. [place] Madeira. Vegetables: Artichokes. Beets. Broccoli. Cabbage. Carrots. Eggplant. Green beans. Chard. Kale. Spinach. Onions. Leeks. Peas. Squash. Tomatoes. Peppers. Radishes. Fruits: Apples. Apricots. Avocado. Berries. Bananas. Cherries. Dates. Figs. Grapes. Lemons. Melon. Oranges. Peaches. Plums. Pomegranate. Meats and other protein foods: Beans. Almonds. Sunflower seeds. Pine nuts. Peanuts. Cod. Salmon. Scallops. Shrimp. Tuna. Tilapia. Clams. Oysters. Eggs. Dairy: Low-fat milk. Cheese. Greek yogurt. Beverages: Water . Red wine. Herbal tea. Fats and oils: Extra virgin olive oil. Avocado oil. Grape seed oil. Sweets and desserts: [place] yogurt with honey. Baked apples. Poached pears. Trail mix. Seasoning and other foods: Basil. Cilantro. Coriander. Cumin. Mint. Parsley. Sage. Rosemary. Tarragon. Garlic. Oregano. Thyme. Pepper. Balsalmic vinegar. Tahini. Hummus. Tomato sauce. Olives. Mushrooms. Limit these Grains: Prepackaged pasta or rice dishes. Prepackaged cereal with added sugar. Vegetables: Deep fried potatoes (french fries). Fruits: Fruit canned in syrup. Meats  and other protein foods: Beef. Pork. Lamb. Poultry with skin. Hot dogs. [place]. Dairy: Ice cream. Sour cream. Whole milk. Beverages: Juice. Sugar-sweetened soft drinks. Beer. Liquor and spirits. Fats and oils: Butter. Canola oil. Vegetable oil. Beef fat (tallow). Lard. Sweets and desserts: Cookies. Cakes. Pies. Candy. Seasoning and other foods: Mayonnaise. Premade sauces and marinades. The items listed may not be a complete list. Talk with your dietitian about what dietary choices are right for you. Summary The Mediterranean diet includes both food and lifestyle choices. Eat a variety of fresh fruits and vegetables, beans, nuts, seeds, and whole grains. Limit the amount of red meat and sweets that you eat. Talk with your health care provider about whether it is safe for you to drink red wine in moderation. This means 1 glass a day for nonpregnant women and 2 glasses a day for men. A glass of wine equals 5 oz (150 mL). This information is not intended to replace advice given to you by your health care provider. Make sure you discuss any questions you have with your health care provider. Document Released: 03/03/2016 Document Revised: 04/05/2016 Document Reviewed: 03/03/2016 Elsevier Interactive Patient Education  2017 [employer].

## 2024-02-05 ENCOUNTER — Telehealth: Payer: Self-pay

## 2024-02-05 NOTE — Telephone Encounter (Signed)
 Left voicemail for patient to call the office back.

## 2024-02-05 NOTE — Telephone Encounter (Signed)
 Power of attorney is a legal document that has to be done in a lawyers office. They need to contact a lawyer that deals with that type of thing

## 2024-02-05 NOTE — Telephone Encounter (Signed)
 Copied from CRM [id number]. Topic: General - Other >> Feb 05, 2024  2:09 PM Mercedes MATSU wrote: Reason for CRM: Nathanel Cleaves (patients niece), Taelynn does not have a power of attorney and is requesting something to be mailed out to the patient so the patient can sign it. Patients niece states that [facility] daughter tammy is trying to get her property, they are threatening Anisha about putting her into a nursing home. Aanvi is walking and is of sound mind and Hargun does not need to be put in a nursing home. Deaunna is requesting a call back [phone].

## 2024-02-06 NOTE — Telephone Encounter (Signed)
Left detailed voicemail for patient to call the office back if any questions or concerns.

## 2024-02-27 ENCOUNTER — Ambulatory Visit: Admitting: Physician Assistant

## 2024-02-27 ENCOUNTER — Ambulatory Visit

## 2024-03-20 ENCOUNTER — Telehealth: Payer: Self-pay

## 2024-03-20 ENCOUNTER — Other Ambulatory Visit (HOSPITAL_COMMUNITY): Payer: Self-pay

## 2024-03-20 ENCOUNTER — Other Ambulatory Visit: Payer: Self-pay | Admitting: Nurse Practitioner

## 2024-03-20 NOTE — Telephone Encounter (Unsigned)
 Copied from CRM #[id number]. Topic: Clinical - Medication Refill >> Mar 20, 2024 11:51 AM Jayma L wrote: Medication: amLODipine  (NORVASC ) 5 MG tablet metoCLOPramide  (REGLAN ) 5 MG tablet  Has the patient contacted their pharmacy? Yes (Agent: If no, request that the patient contact the pharmacy for the refill. If patient does not wish to contact the pharmacy document the reason why and proceed with request.) (Agent: If yes, when and what did the pharmacy advise?)  This is the patient's preferred pharmacy:  [facility] - [place], [place] - [address] [place] [address] Phone: [phone] Fax: [phone]   Is this the correct pharmacy for this prescription? Yes If no, delete pharmacy and type the correct one.   Has the prescription been filled recently? Yes  Is the patient out of the medication? No  Has the patient been seen for an appointment in the last year OR does the patient have an upcoming appointment? Yes  Can we respond through MyChart? No  Agent: Please be advised that Rx refills may take up to 3 business days. We ask that you follow-up with your pharmacy.

## 2024-03-20 NOTE — Telephone Encounter (Signed)
 Per test claim: Refill too soon. PA is not needed at this time. Medication was filled 02/29/24. Next eligible fill date is 03/23/24.

## 2024-03-20 NOTE — Telephone Encounter (Signed)
 Pharmacy Patient Advocate Encounter   Received notification from Pt Calls Messages that prior authorization for MIRTAZAPINE  7.5MG  is required/requested.   Insurance verification completed.   The patient is insured through [employer] .   Per test claim: Refill too soon. PA is not needed at this time. Medication was filled 02/29/24. Next eligible fill date is 03/23/24.

## 2024-03-20 NOTE — Telephone Encounter (Signed)
 Copied from CRM [id number]. Topic: Clinical - Medication Prior Auth >> Mar 20, 2024 12:06 PM Aleatha C wrote: Reason for CRM:  mirtazapine  (REMERON ) 7.5 MG tablet needs a pre authorization

## 2024-03-21 MED ORDER — AMLODIPINE BESYLATE 5 MG PO TABS: 5.0000 mg | ORAL_TABLET | Freq: Every morning | ORAL | 1 refills | Status: DC

## 2024-03-21 MED ORDER — METOCLOPRAMIDE HCL 5 MG PO TABS: 5.0000 mg | ORAL_TABLET | Freq: Two times a day (BID) | ORAL | 1 refills | Status: DC

## 2024-04-01 ENCOUNTER — Other Ambulatory Visit

## 2024-04-08 ENCOUNTER — Ambulatory Visit (INDEPENDENT_AMBULATORY_CARE_PROVIDER_SITE_OTHER)
Admission: RE | Admit: 2024-04-08 | Discharge: 2024-04-08 | Disposition: A | Discharge status: Home / Self Care | Source: Ambulatory Visit | Attending: Nurse Practitioner

## 2024-04-08 ENCOUNTER — Encounter: Payer: Self-pay | Admitting: Nurse Practitioner

## 2024-04-08 ENCOUNTER — Telehealth: Payer: Self-pay

## 2024-04-08 ENCOUNTER — Ambulatory Visit: Admitting: Nurse Practitioner

## 2024-04-08 VITALS — BP 112/80 | HR 56 | Temp 97.4°F | Ht 59.5 in | Wt 137.0 lb

## 2024-04-08 DIAGNOSIS — J449 Chronic obstructive pulmonary disease, unspecified: Secondary | ICD-10-CM | POA: Diagnosis not present

## 2024-04-08 DIAGNOSIS — N3281 Overactive bladder: Secondary | ICD-10-CM | POA: Diagnosis not present

## 2024-04-08 DIAGNOSIS — E785 Hyperlipidemia, unspecified: Secondary | ICD-10-CM

## 2024-04-08 DIAGNOSIS — Z5919 Other inadequate housing: Secondary | ICD-10-CM

## 2024-04-08 DIAGNOSIS — M898X1 Other specified disorders of bone, shoulder: Secondary | ICD-10-CM | POA: Diagnosis not present

## 2024-04-08 DIAGNOSIS — I10 Essential (primary) hypertension: Secondary | ICD-10-CM | POA: Diagnosis not present

## 2024-04-08 DIAGNOSIS — Z23 Encounter for immunization: Secondary | ICD-10-CM | POA: Diagnosis not present

## 2024-04-08 DIAGNOSIS — Z87891 Personal history of nicotine dependence: Secondary | ICD-10-CM | POA: Diagnosis not present

## 2024-04-08 DIAGNOSIS — E559 Vitamin D deficiency, unspecified: Secondary | ICD-10-CM

## 2024-04-08 DIAGNOSIS — K219 Gastro-esophageal reflux disease without esophagitis: Secondary | ICD-10-CM

## 2024-04-08 DIAGNOSIS — Z0001 Encounter for general adult medical examination with abnormal findings: Secondary | ICD-10-CM

## 2024-04-08 DIAGNOSIS — M25512 Pain in left shoulder: Secondary | ICD-10-CM | POA: Diagnosis not present

## 2024-04-08 LAB — CBC WITH DIFFERENTIAL/PLATELET
Basophils Absolute: 0 K/uL (ref 0.0–0.1)
Basophils Relative: 0.5 % (ref 0.0–3.0)
Eosinophils Absolute: 0.1 K/uL (ref 0.0–0.7)
Eosinophils Relative: 1.7 % (ref 0.0–5.0)
HCT: 33.1 % — ABNORMAL LOW (ref 36.0–46.0)
Hemoglobin: 10.5 g/dL — ABNORMAL LOW (ref 12.0–15.0)
Lymphocytes Relative: 17.5 % (ref 12.0–46.0)
Lymphs Abs: 1.4 K/uL (ref 0.7–4.0)
MCHC: 31.8 g/dL (ref 30.0–36.0)
MCV: 74.6 fl — ABNORMAL LOW (ref 78.0–100.0)
Monocytes Absolute: 0.5 K/uL (ref 0.1–1.0)
Monocytes Relative: 6.7 % (ref 3.0–12.0)
Neutro Abs: 5.7 K/uL (ref 1.4–7.7)
Neutrophils Relative %: 73.6 % (ref 43.0–77.0)
Platelets: 399 K/uL (ref 150.0–400.0)
RBC: 4.43 Mil/uL (ref 3.87–5.11)
RDW: 18.9 % — ABNORMAL HIGH (ref 11.5–15.5)
WBC: 7.7 K/uL (ref 4.0–10.5)

## 2024-04-08 LAB — COMPREHENSIVE METABOLIC PANEL WITH GFR
ALT: 9 U/L (ref 0–35)
AST: 18 U/L (ref 0–37)
Albumin: 3.8 g/dL (ref 3.5–5.2)
Alkaline Phosphatase: 72 U/L (ref 39–117)
BUN: 14 mg/dL (ref 6–23)
CO2: 30 meq/L (ref 19–32)
Calcium: 9.3 mg/dL (ref 8.4–10.5)
Chloride: 102 meq/L (ref 96–112)
Creatinine, Ser: 1.2 mg/dL (ref 0.40–1.20)
GFR: 42.6 mL/min — ABNORMAL LOW (ref >60.00)
Glucose, Bld: 94 mg/dL (ref 70–99)
Potassium: 4.6 meq/L (ref 3.5–5.1)
Sodium: 139 meq/L (ref 135–145)
Total Bilirubin: 0.3 mg/dL (ref 0.2–1.2)
Total Protein: 7.6 g/dL (ref 6.0–8.3)

## 2024-04-08 LAB — LIPID PANEL
Cholesterol: 142 mg/dL (ref 0–200)
HDL: 59.4 mg/dL (ref >39.00)
LDL Cholesterol: 62 mg/dL (ref 0–99)
NonHDL: 82.92
Total CHOL/HDL Ratio: 2
Triglycerides: 106 mg/dL (ref 0.0–149.0)
VLDL: 21.2 mg/dL (ref 0.0–40.0)

## 2024-04-08 LAB — TSH: TSH: 1.74 u[IU]/mL (ref 0.35–5.50)

## 2024-04-08 LAB — URINALYSIS, MICROSCOPIC ONLY

## 2024-04-08 LAB — VITAMIN D 25 HYDROXY (VIT D DEFICIENCY, FRACTURES): VITD: 57.54 ng/mL (ref 30.00–100.00)

## 2024-04-08 NOTE — Assessment & Plan Note (Signed)
History of the same pending vitamin D level today
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
## 2024-04-08 NOTE — Assessment & Plan Note (Signed)
 Patient currently maintained on amlodipine  5 mg daily and lisinopril  10 mg daily.  Blood pressure controlled.  Continue medication as prescribed
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
## 2024-04-08 NOTE — Assessment & Plan Note (Signed)
 History of the same.  Pending lipid panel today
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
## 2024-04-08 NOTE — Assessment & Plan Note (Signed)
 Status post injury pain on palpation.  Pending x-ray will

## 2024-04-08 NOTE — Assessment & Plan Note (Signed)
 History of the same.  Patient does have incontinence and wears briefs.  Is followed by urology

## 2024-04-08 NOTE — Assessment & Plan Note (Signed)
 Currently on Trelegy and albuterol  inhaler.  Continues medications prescribed

## 2024-04-08 NOTE — Patient Instructions (Signed)
 Nice to see you today I want to see you in 6 weeks We did update your flu vaccine today  I will be in touch with the labs once I have them

## 2024-04-08 NOTE — Telephone Encounter (Signed)
 Received a phone call from [facility] who reported that the case as been declined as an APS case but is being referred to the [facility].

## 2024-04-08 NOTE — Assessment & Plan Note (Signed)
 Pending urine microscopy rule out microscopic hematuria

## 2024-04-09 ENCOUNTER — Ambulatory Visit: Payer: Self-pay | Admitting: Nurse Practitioner

## 2024-04-19 ENCOUNTER — Other Ambulatory Visit: Payer: Self-pay

## 2024-04-19 DIAGNOSIS — R0602 Shortness of breath: Secondary | ICD-10-CM

## 2024-04-22 ENCOUNTER — Ambulatory Visit
Admission: RE | Admit: 2024-04-22 | Discharge: 2024-04-22 | Disposition: A | Discharge status: Home / Self Care | Source: Ambulatory Visit | Attending: Pulmonary Disease | Admitting: Pulmonary Disease

## 2024-04-22 ENCOUNTER — Ambulatory Visit: Admitting: Pulmonary Disease

## 2024-04-22 DIAGNOSIS — J439 Emphysema, unspecified: Secondary | ICD-10-CM

## 2024-04-22 DIAGNOSIS — R0602 Shortness of breath: Secondary | ICD-10-CM | POA: Insufficient documentation

## 2024-04-22 LAB — PULMONARY FUNCTION TEST
FEF 25-75 Post: 0.52 L/s
FEF 25-75 Pre: 0.51 L/s
FEF2575-%Change-Post: 1 %
FEF2575-%Pred-Post: 43 %
FEF2575-%Pred-Pre: 43 %
FEV1-%Change-Post: 1 %
FEV1-%Pred-Post: 69 %
FEV1-%Pred-Pre: 68 %
FEV1-Post: 1.13 L
FEV1-Pre: 1.11 L
FEV1FVC-%Change-Post: 0 %
FEV1FVC-%Pred-Pre: 78 %
FEV6-%Change-Post: 1 %
FEV6-%Pred-Post: 92 %
FEV6-%Pred-Pre: 91 %
FEV6-Post: 1.93 L
FEV6-Pre: 1.9 L
FEV6FVC-%Change-Post: 0 %
FEV6FVC-%Pred-Post: 104 %
FEV6FVC-%Pred-Pre: 105 %
FVC-%Change-Post: 2 %
FVC-%Pred-Post: 88 %
FVC-%Pred-Pre: 86 %
FVC-Post: 1.95 L
FVC-Pre: 1.91 L
Post FEV1/FVC ratio: 58 %
Post FEV6/FVC ratio: 99 %
Pre FEV1/FVC ratio: 58 %
Pre FEV6/FVC Ratio: 99 %
RV % pred: 103 %
RV: 2.34 L
TLC % pred: 97 %
TLC: 4.48 L

## 2024-04-22 NOTE — Patient Instructions (Signed)
 Full PFT completed today ? ?

## 2024-04-22 NOTE — Progress Notes (Signed)
 Full PFT completed today ? ?

## 2024-04-23 ENCOUNTER — Telehealth: Payer: Self-pay

## 2024-04-23 DIAGNOSIS — J441 Chronic obstructive pulmonary disease with (acute) exacerbation: Secondary | ICD-10-CM

## 2024-04-23 NOTE — Telephone Encounter (Signed)
 LAST APPOINTMENT DATE: 04/08/2024  NEXT APPOINTMENT DATE: 05/22/2024  Trellegy Ellipta    LAST REFILL: 04/05/2023  QTY: 1 each, 11RF expired 04/04/24

## 2024-04-24 MED ORDER — TRELEGY ELLIPTA 100-62.5-25 MCG/ACT IN AEPB: 1.0000 | INHALATION_SPRAY | Freq: Every morning | RESPIRATORY_TRACT | 11 refills | Status: AC

## 2024-04-24 NOTE — Addendum Note (Signed)
 Addended by: WENDEE LYNWOOD HERO on: 04/24/2024 03:06 PM   Modules accepted: Orders

## 2024-05-22 ENCOUNTER — Ambulatory Visit (INDEPENDENT_AMBULATORY_CARE_PROVIDER_SITE_OTHER): Admitting: Nurse Practitioner

## 2024-05-22 VITALS — BP 102/62 | HR 72 | Temp 97.6°F | Ht 61.0 in | Wt 134.8 lb

## 2024-05-22 DIAGNOSIS — R1013 Epigastric pain: Secondary | ICD-10-CM | POA: Diagnosis not present

## 2024-05-22 DIAGNOSIS — R0601 Orthopnea: Secondary | ICD-10-CM

## 2024-05-22 DIAGNOSIS — K219 Gastro-esophageal reflux disease without esophagitis: Secondary | ICD-10-CM

## 2024-05-22 DIAGNOSIS — I10 Essential (primary) hypertension: Secondary | ICD-10-CM | POA: Diagnosis not present

## 2024-05-22 DIAGNOSIS — R42 Dizziness and giddiness: Secondary | ICD-10-CM | POA: Diagnosis not present

## 2024-05-22 MED ORDER — FAMOTIDINE 20 MG PO TABS
20.0000 mg | ORAL_TABLET | Freq: Two times a day (BID) | ORAL | 0 refills | Status: AC | PRN
Start: 2024-05-22 — End: ?

## 2024-05-22 MED ORDER — AMLODIPINE BESYLATE 2.5 MG PO TABS
2.5000 mg | ORAL_TABLET | Freq: Every day | ORAL | 0 refills | Status: AC
Start: 2024-05-22 — End: ?
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
## 2024-05-22 NOTE — Patient Instructions (Signed)
 Nice to see you today  I am going to decrease your amlodipine  from 5mg  to 2.5mg  Check your blood pressure twice a week at home Follow up with me in 3 months, sooner If you need me

## 2024-05-28 ENCOUNTER — Encounter: Payer: Self-pay | Admitting: Pulmonary Disease

## 2024-05-28 ENCOUNTER — Ambulatory Visit: Admitting: Pulmonary Disease

## 2024-05-28 ENCOUNTER — Telehealth: Payer: Self-pay

## 2024-05-28 VITALS — BP 120/78 | HR 76 | Temp 97.5°F | Ht 61.0 in | Wt 132.6 lb

## 2024-05-28 DIAGNOSIS — J449 Chronic obstructive pulmonary disease, unspecified: Secondary | ICD-10-CM | POA: Diagnosis not present

## 2024-05-28 DIAGNOSIS — J4489 Other specified chronic obstructive pulmonary disease: Secondary | ICD-10-CM

## 2024-05-28 NOTE — Telephone Encounter (Signed)
 Patient was seen in the office today. Dr. Larinda Buttery has ordered [facility] for the patient. She has signed the form and it has been faxed to the pharmacy team for completion.

## 2024-05-28 NOTE — Progress Notes (Signed)
 Synopsis: Referred in by Wendee Lynwood HERO, NP   Subjective:   PATIENT ID: [name] Gambler GENDER: female DOB: [birth year], MRN: [id number]  Chief Complaint  Patient presents with   Emphysema    DOE. Wheezing. Occasional cough. Trelegy- daily helps with her breathing. Albuterol - 4-5 times day.     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  She reports that she has been doing well on Trelegy Ellipta .  Has been using her albuterol  inhaler only as needed however sometimes she needs to use it 4 times a day.  Her main complaint today was dizziness and this happens when she moves her head quickly.  Chest x-ray in 2023 with no acute findings.  12/07/2023 OV - Patient doing well overall. Compliant with Trelegy Ellipta . Using her rescue inhaler couple of times a day.   05/28/2024 OV - Patient is doing well on Trelegy but still using her Rescue inhaler 2 to 3 times per day. Discussed starting Ohtuvayre  which she is agreeable with. I will also plan to repeat a CT chest when I see her on the next visit.   ROS All systems were reviewed and are negative except for the above.  Objective:   Vitals:   05/28/24 1303  BP: 120/78  Pulse: 76  Temp: (!) 97.5 F (36.4 C)  SpO2: 96%  Weight: 132 lb 9.6 oz (60.1 kg)  Height: 5' 1 (1.549 m)   96% on RA BMI Readings from Last 3 Encounters:  05/28/24 25.05 kg/m  05/22/24 25.47 kg/m  04/22/24 25.89 kg/m   Wt Readings from Last 3 Encounters:  05/28/24 132 lb 9.6 oz (60.1 kg)  05/22/24 134 lb 12.8 oz (61.1 kg)  04/22/24 137 lb (62.1 kg)    Physical Exam GEN: NAD, Healthy Appearing HEENT: Supple Neck, Reactive Pupils, EOMI  CVS: Normal S1, Normal S2, RRR, No murmurs or ES appreciated  Lungs: Clear bilateral air entry.  Abdomen: Soft, non tender, non distended, + BS  Extremities: Warm and  well perfused, No edema  Skin: No suspicious lesions appreciated  Psych: Normal Affect  Ancillary Information   CBC    Component Value Date/Time   WBC 7.7 04/08/2024 1034   RBC 4.43 04/08/2024 1034   HGB 10.5 (L) 04/08/2024 1034   HGB 12.8 07/30/2018 1123   HGB 12.5 01/17/2018 0853   HGB 13.3 07/27/2017 0939   HCT 33.1 (L) 04/08/2024 1034   HCT 37.9 01/17/2018 0853   HCT 39.3 07/27/2017 0939   PLT 399.0 04/08/2024 1034   PLT 265 07/30/2018 1123   PLT 308 01/17/2018 0853   MCV 74.6 (L) 04/08/2024 1034   MCV 88 01/17/2018 0853   MCV 90.5 07/27/2017 0939   MCH 26.1 05/16/2022 2244   MCHC 31.8 04/08/2024 1034   RDW 18.9 (H) 04/08/2024 1034   RDW 15.3 01/17/2018 0853   RDW 13.8 07/27/2017 0939   LYMPHSABS 1.4 04/08/2024 1034   LYMPHSABS 1.5 07/27/2017 0939   MONOABS 0.5 04/08/2024 1034   MONOABS 0.4 07/27/2017 0939   EOSABS 0.1 04/08/2024 1034   EOSABS 0.1 07/27/2017 0939   BASOSABS 0.0 04/08/2024 1034   BASOSABS 0.0 07/27/2017 0939   Labs and imaging were reviewed.     Latest Ref Rng & Units 04/22/2024   11:10 AM 03/08/2016    9:28 AM  PFT Results  FVC-Pre L  1.91  1.96   FVC-Predicted Pre % 86  79   FVC-Post L 1.95  2.17   FVC-Predicted Post % 88  87   Pre FEV1/FVC % % 58  62   Post FEV1/FCV % % 58  59   FEV1-Pre L 1.11  1.21   FEV1-Predicted Pre % 68  65   FEV1-Post L 1.13  1.29   DLCO uncorrected ml/min/mmHg  14.10   DLCO UNC% %  72   DLCO corrected ml/min/mmHg  14.62   DLCO COR %Predicted %  74   DLVA Predicted %  75   TLC L 4.48  4.94   TLC % Predicted % 97  109   RV % Predicted % 103  129      Assessment & Plan:  Ms. 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  #Stage II COPD gpB FEV-1 69% of predicied.   []  C/w Trelegy ellipta  100 1puff once a day.  []  C/w Albuterol  as needed.  []  Start Ohtuvayre .  []  Repeat  CT chest on next visit.   RTC 6 monhts.   I personally spent a total of 40 minutes in the care of the patient today including preparing to see the patient, getting/reviewing separately obtained history, performing a medically appropriate exam/evaluation, counseling and educating, placing orders, documenting clinical information in the EHR, independently interpreting results, and communicating results.   Darrin Barn, MD [facility] 05/28/2024 1:27 PM

## 2024-05-29 ENCOUNTER — Telehealth: Payer: Self-pay

## 2024-05-29 NOTE — Telephone Encounter (Signed)
 Received Ohtuvayre  new start paperwork. Completed form and faxed with clinicals and insurance card copy to [facility]   Phone#: [phone] Fax#: [phone]

## 2024-05-29 NOTE — Telephone Encounter (Signed)
 Per telephone encounter from 11/5, the pharmacy team has received the forms.  Nothing further needed.

## 2024-05-30 ENCOUNTER — Ambulatory Visit: Payer: Self-pay

## 2024-05-30 ENCOUNTER — Other Ambulatory Visit: Payer: Self-pay | Admitting: Nurse Practitioner

## 2024-05-30 DIAGNOSIS — R42 Dizziness and giddiness: Secondary | ICD-10-CM

## 2024-05-30 MED ORDER — MECLIZINE HCL 12.5 MG PO TABS: 12.5000 mg | ORAL_TABLET | Freq: Three times a day (TID) | ORAL | 0 refills | Status: DC | PRN

## 2024-05-30 NOTE — Telephone Encounter (Signed)
 Patient needs meclizine  refilled

## 2024-05-30 NOTE — Telephone Encounter (Signed)
 This RN attempted to reach patient at daughters number- Left VM to call back to discuss medication. Attempt #1  Copied from CRM #[id number]. Topic: Clinical - Medication Question >> May 30, 2024  9:12 AM Kendralyn S wrote: Reason for CRM: needs refill for medication for dizziness but doesn't know the name of it

## 2024-05-30 NOTE — Telephone Encounter (Signed)
 Received fax from [employer] confirming receipt of enrollment form.   Patient ID: [id number]

## 2024-05-30 NOTE — Telephone Encounter (Signed)
 Refill provided

## 2024-05-30 NOTE — Telephone Encounter (Signed)
 This RN reached out to daughter Madelin who's number is listed in chart- Left VM to call back to discuss. Attempt #3

## 2024-05-31 NOTE — Telephone Encounter (Signed)
 Received fax from [employer] with summary of benefits. Referral form for Ohtuvayre  received. Rx will be triaged to [facility].. Once benefits investigation completed, pharmacy will reach out the patient to schedule shipment. If medication is unaffordable, patient will need to express financial hardship to be referred back to [facility] for patient assistance program pre-screening.   Patient ID: [id number] Pharmacy phone: [phone]  [facility] Phone#: [phone]

## 2024-06-03 ENCOUNTER — Telehealth: Payer: Self-pay

## 2024-06-03 ENCOUNTER — Ambulatory Visit: Payer: Self-pay | Admitting: Nurse Practitioner

## 2024-06-03 ENCOUNTER — Other Ambulatory Visit: Payer: Self-pay | Admitting: Nurse Practitioner

## 2024-06-03 DIAGNOSIS — R42 Dizziness and giddiness: Secondary | ICD-10-CM

## 2024-06-03 MED ORDER — METOCLOPRAMIDE HCL 5 MG PO TABS: 5.0000 mg | ORAL_TABLET | Freq: Two times a day (BID) | ORAL | 1 refills | Status: DC

## 2024-06-03 NOTE — Telephone Encounter (Signed)
 Unable to reach pt by phone but I did speak with [name] her daughter (DPR signed) and [name] has not picked up Meclizine  yet due to pharmacy being in [place]. [name] said pt usually takes Meclizine  12.5 mg once a day and does not take tid., [name] plans to pick up Meclizine  at pharmacy on 06/04/24.  [name] said is not lightheaded every day and no H/A,or CP. SOB that is no more than usual. I offered to schedule pt appt with a different provider but pt will only see Felicia Crandall NP. [name]  scheduled appt with Matt on 06/06/24 at 12 noon with UC & ED precautions and [name] voiced understanding. Pt does not have way to ck BP at home. Sending note to Felicia Crandall NP and [employer].

## 2024-06-03 NOTE — Telephone Encounter (Signed)
 FYI Only or Action Required?: Action required by provider: medication refill request.- Pt states she just needs her meclizine  refilled.   Patient was last seen in primary care on 05/22/2024 by Wendee Lynwood HERO, NP.  Called Nurse Triage reporting Dizziness and Shortness of Breath.  Symptoms began yesterday.  Interventions attempted: Nothing.  Symptoms are: unchanged.  Triage Disposition: See PCP Within 2 Weeks  Patient/caregiver understands and will follow disposition?: Yes    Copied from CRM [id number]. Topic: Clinical - Red Word Triage >> Jun 03, 2024  9:37 AM Wess RAMAN wrote: Red Word that prompted transfer to Nurse Triage: Patient is very dizzy. She stated it feels like her body is going one way when it should be going another.  Submitted refill request for meclizine  (ANTIVERT ) 12.5 MG tablet. Patient is out of medication. Reason for Disposition  MILD dizziness is a chronic symptom (recurrent or ongoing AND present > 4 weeks)  Answer Assessment - Initial Assessment Questions Started this weekend because she ran out of the medication. She states the dizziness is not new. She states she takes the meclizine  every day. She also sounded short of breath. She states that is also not new. She states she was trying to clean some things this morning and it makes her shortness of breath worse. RN asked if anything was worse today than normal. She said only the dizziness because I don't have my pill.     1. DESCRIPTION: Describe your dizziness.     Dizzy 2. VERTIGO: Do you feel like either you or the room is spinning or tilting?      Yes and can feel  3. LIGHTHEADED: Do you feel lightheaded? (e.g., somewhat faint, woozy, weak upon standing)     Feels like room is spinning 4. SEVERITY: How bad is it?  Can you walk?     Yes but feels like she is moving  5. ONSET:  When did the dizziness begin?     This weekend 6. AGGRAVATING FACTORS: Does anything make it worse? (e.g.,  standing, change in head position)     no 7. CAUSE: What do you think is causing the dizziness?     unknown 8. RECURRENT SYMPTOM: Have you had dizziness before? If Yes, ask: When was the last time? What happened that time?     Yes, the only reason it happened was because she hasn't had her pills.  9. OTHER SYMPTOMS: Do you have any other symptoms? (e.g., earache, headache, numbness, tinnitus, vomiting, weakness)     Denies  Protocols used: Dizziness - Vertigo-A-AH

## 2024-06-03 NOTE — Telephone Encounter (Signed)
 Noted

## 2024-06-03 NOTE — Telephone Encounter (Signed)
 LAST APPOINTMENT DATE: 05/22/2024   NEXT APPOINTMENT DATE: 11/19/2024  Metoclopramide   5 MG  LAST REFILL: 03/21/2024  QTY: #60, 1RF

## 2024-06-03 NOTE — Telephone Encounter (Signed)
 Meclizine  was refilled on 05/30/2024. I do not see where she has an appt as recommended by triage

## 2024-06-03 NOTE — Addendum Note (Signed)
 Addended by: WENDEE LYNWOOD HERO on: 06/03/2024 01:20 PM   Modules accepted: Orders

## 2024-06-03 NOTE — Telephone Encounter (Signed)
 Copied from CRM #[id number]. Topic: Clinical - Medication Refill >> Jun 03, 2024  9:35 AM Wess RAMAN wrote: Medication: meclizine  (ANTIVERT ) 12.5 MG tablet   Has the patient contacted their pharmacy? No (Agent: If no, request that the patient contact the pharmacy for the refill. If patient does not wish to contact the pharmacy document the reason why and proceed with request.) (Agent: If yes, when and what did the pharmacy advise?)  This is the patient's preferred pharmacy:  [facility] - [place], [place] - [address] [place] [address] Phone: [phone] Fax: [phone]  Is this the correct pharmacy for this prescription? Yes If no, delete pharmacy and type the correct one.   Has the prescription been filled recently? Yes  Is the patient out of the medication? Yes  Has the patient been seen for an appointment in the last year OR does the patient have an upcoming appointment? Yes  Can we respond through MyChart? No  Agent: Please be advised that Rx refills may take up to 3 business days. We ask that you follow-up with your pharmacy.

## 2024-06-04 ENCOUNTER — Ambulatory Visit (HOSPITAL_COMMUNITY)

## 2024-06-06 ENCOUNTER — Ambulatory Visit (INDEPENDENT_AMBULATORY_CARE_PROVIDER_SITE_OTHER): Admitting: Nurse Practitioner

## 2024-06-06 VITALS — BP 118/82 | HR 82 | Temp 97.6°F | Ht 61.0 in | Wt 134.2 lb

## 2024-06-06 DIAGNOSIS — R42 Dizziness and giddiness: Secondary | ICD-10-CM | POA: Diagnosis not present

## 2024-06-06 DIAGNOSIS — I10 Essential (primary) hypertension: Secondary | ICD-10-CM | POA: Diagnosis not present

## 2024-06-06 NOTE — Progress Notes (Signed)
 Established Patient Office Visit  Subjective   Patient ID: [name], female    DOB: 01/03/1941  Age: 83 y.o. MRN: [id number]  Chief Complaint  Patient presents with   Dizziness    Pt complains of ongoing dizziness/lightheaded. No falls.     Discussed the use of AI scribe software for clinical note transcription with the patient, who gave verbal consent to proceed.  History of Present Illness [name] is an 83 year old female who presents with worsening dizziness and medication refill.  She experiences 'heavy dizziness' which has been a recurring issue. The dizziness primarily occurs when she gets up and walks around, rather than when sitting still. She recently ran out of her medication, meclizine , which she takes once daily and finds helpful in managing her symptoms. No falls are associated with the dizziness. She has an upcoming appointment with a cardiologist in a month and a neurologist on January 7th. She has not yet discussed the dizziness with her neurologist.  She reports occasional breathing difficulties, describing it as feeling tired and 'fighting to get my breath' when active. Her symptoms improve when she sits down and rests. She uses an inhaler, which provides relief. No fever, chills, or chest pain, although she experiences pressure in her chest when lying on her back.     Review of Systems  Constitutional:  Negative for chills and fever.  Respiratory:  Positive for shortness of breath.   Cardiovascular:  Negative for chest pain.  Neurological:  Positive for dizziness. Negative for headaches.      Objective:     BP 118/82   Pulse 82   Temp 97.6 F (36.4 C) (Oral)   Ht 5' 1 (1.549 m)   Wt 134 lb 3.2 oz (60.9 kg)   SpO2 96%   BMI 25.36 kg/m  BP Readings from Last 3 Encounters:  06/06/24 118/82  05/28/24 120/78  05/22/24 102/62   Wt Readings from Last 3 Encounters:  06/06/24 134 lb 3.2 oz (60.9 kg)  05/28/24 132 lb 9.6 oz (60.1 kg)   05/22/24 134 lb 12.8 oz (61.1 kg)   SpO2 Readings from Last 3 Encounters:  06/06/24 96%  05/28/24 96%  05/22/24 95%      Physical Exam Vitals and nursing note reviewed.  Constitutional:      Appearance: Normal appearance.  Cardiovascular:     Rate and Rhythm: Normal rate and regular rhythm.     Heart sounds: Normal heart sounds.  Pulmonary:     Effort: Pulmonary effort is normal.     Breath sounds: Normal breath sounds.  Neurological:     General: No focal deficit present.     Mental Status: She is alert.     Motor: Motor function is intact.     Coordination: Coordination is intact.     Gait: Gait is intact.     Deep Tendon Reflexes:     Reflex Scores:      Bicep reflexes are 2+ on the right side and 2+ on the left side.      Patellar reflexes are 2+ on the right side and 2+ on the left side.    Comments: Bilateral upper and lower extremity strength 5/5      No results found for any visits on 06/06/24.    The ASCVD Risk score (Arnett DK, et al., 2019) failed to calculate for the following reasons:   The 2019 ASCVD risk score is only valid for ages 10 to 46  Risk score cannot be calculated because patient has a medical history suggesting prior/existing ASCVD    Assessment & Plan:   Problem List Items Addressed This Visit       Cardiovascular and Mediastinum   Essential hypertension - Primary     Other   Dizziness  Assessment and Plan Assessment & Plan Dizziness Persistent dizziness, relieved by meclizine . Neurological evaluation pending. - Continue meclizine  as needed. - Communicate with neurologist. - Follow up with cardiologist in one month.  Shortness of breath Intermittent shortness of breath with exertion, relieved by rest and inhaler. - Continue inhaler as needed.  Essential hypertension Blood pressure well-controlled. - Continue current antihypertensive regimen. - we have titrated her antihypertensives down without improvement of  dizziness   Return in about 3 months (around 09/06/2024) for BP recheck/ dizziness.    Adina Crandall, NP

## 2024-06-06 NOTE — Patient Instructions (Signed)
 Nice to see you today  You can continue using the meclizine  Follow up with me in 3 months, sooner if needed

## 2024-06-07 NOTE — Telephone Encounter (Signed)
 Received fax from [facility] - unable to reach patient.   Patient does not use MyChart.   Will attempt to call patient.

## 2024-06-10 NOTE — Telephone Encounter (Signed)
 ATC patient - patient's daughter, Tammy answered the phone.  She reports they have received calls from [employer] and someone left a VM. She plans to return the call today.   Provided contact information for [facility] and for [facility] in case the copay is not affordable.   Pharmacy phone: [phone]  [facility] Phone#: [phone]

## 2024-06-18 ENCOUNTER — Ambulatory Visit (HOSPITAL_COMMUNITY)
Admission: RE | Admit: 2024-06-18 | Discharge: 2024-06-18 | Disposition: A | Discharge status: Home / Self Care | Source: Ambulatory Visit | Attending: Nurse Practitioner | Admitting: Nurse Practitioner

## 2024-06-18 DIAGNOSIS — R0601 Orthopnea: Secondary | ICD-10-CM | POA: Diagnosis not present

## 2024-06-18 DIAGNOSIS — R0609 Other forms of dyspnea: Secondary | ICD-10-CM | POA: Diagnosis not present

## 2024-06-18 DIAGNOSIS — R0600 Dyspnea, unspecified: Secondary | ICD-10-CM | POA: Insufficient documentation

## 2024-06-18 DIAGNOSIS — I358 Other nonrheumatic aortic valve disorders: Secondary | ICD-10-CM | POA: Diagnosis not present

## 2024-06-18 DIAGNOSIS — I517 Cardiomegaly: Secondary | ICD-10-CM | POA: Diagnosis not present

## 2024-06-18 LAB — ECHOCARDIOGRAM COMPLETE
Area-P 1/2: 2.9 cm2
Calc EF: 70.8 %
MV VTI: 2.29 cm2
S' Lateral: 3.3 cm
Single Plane A2C EF: 73.7 %
Single Plane A4C EF: 69.5 %

## 2024-06-18 NOTE — Progress Notes (Signed)
  Echocardiogram 2D Echocardiogram has been performed.  Norleen ORN [facility] 06/18/2024, 11:38 AM

## 2024-06-24 ENCOUNTER — Ambulatory Visit: Payer: Self-pay | Admitting: Nurse Practitioner

## 2024-06-26 ENCOUNTER — Ambulatory Visit: Attending: Cardiology | Admitting: Cardiology

## 2024-06-26 ENCOUNTER — Encounter: Payer: Self-pay | Admitting: Cardiology

## 2024-06-26 VITALS — BP 134/73 | HR 93 | Ht 60.0 in | Wt 130.0 lb

## 2024-06-26 DIAGNOSIS — I10 Essential (primary) hypertension: Secondary | ICD-10-CM | POA: Diagnosis not present

## 2024-06-26 DIAGNOSIS — R0601 Orthopnea: Secondary | ICD-10-CM | POA: Insufficient documentation

## 2024-06-26 DIAGNOSIS — R0609 Other forms of dyspnea: Secondary | ICD-10-CM | POA: Diagnosis not present

## 2024-06-26 NOTE — Progress Notes (Signed)
 Cardiology Office Note:  .   Date:  06/26/2024  ID:  [name], DOB 07-29-1940, MRN [id number] PCP: Wendee Lynwood HERO, NP  [facility] Providers Cardiologist:  Newman Lawrence, MD PCP: Wendee Lynwood HERO, NP  Chief Complaint  Patient presents with   orthopnea     [name] is a 83 y.o. female with hypertension, hyperlipidemia, dementia, exertional dyspnea, orthopnea  Discussed the use of AI scribe software for clinical note transcription with the patient, who gave verbal consent to proceed.  History of Present Illness [name] is an 83 year old female with hypertension who presents with shortness of breath. She was referred by Dr. Lynwood Kid for evaluation of shortness of breath and orthopnea.  She has exertional shortness of breath, such as with walking more than usual, including walking to the office today. She has no shortness of breath with routine daily activities, at rest, or during sleep, and no nocturnal awakenings short of breath.  For the past four to five months she has felt a smothering sensation when lying on her back. She has no associated chest pain.  She has occasional lightheadedness described as feeling whoa, whoa, whoa, without syncope.  She recently had an echocardiogram and was told her heart function was just under normal.  Aricept  is listed in her medication list, but she denies taking it and is aware of her current medications.  She lives with her son, is independent with activities such as mopping and sweeping, does not smoke, and does not drink alcohol.      Vitals:   06/26/24 1059  BP: 134/73  Pulse: 93  SpO2: 96%      Review of Systems  Cardiovascular:  Positive for dyspnea on exertion and orthopnea. Negative for chest pain, leg swelling, palpitations and syncope.        Studies Reviewed: SABRA        EKG 06/26/2024: Normal sinus rhythm Normal ECG When compared with ECG of 17-May-2022 08:47,  rate is  faster     Echocardiogram 05/2024:  1. Left ventricular ejection fraction, by estimation, is 50 to 55%. The  left ventricle has low normal function. Left ventricular endocardial  border not optimally defined to evaluate regional wall motion. There is  mild left ventricular hypertrophy. Left  ventricular diastolic parameters are consistent with Grade I diastolic  dysfunction (impaired relaxation).   2. Right ventricular systolic function is normal. The right ventricular  size is normal. Tricuspid regurgitation signal is inadequate for assessing  PA pressure.   3. The mitral valve is degenerative. Trivial mitral valve regurgitation.  No evidence of mitral stenosis.   4. The aortic valve is grossly normal. There is mild calcification of the  aortic valve. Aortic valve regurgitation is trivial. No aortic stenosis is  present.   5. The inferior vena cava is normal in size with <50% respiratory  variability, suggesting right atrial pressure of 8 mmHg.   Coronary angiogram 2019:  Prox RCA lesion is 25% stenosed. Mid LAD lesion is 25% stenosed. There is moderate left ventricular systolic dysfunction. LV end diastolic pressure is moderately elevated. The left ventricular ejection fraction is 25-35% by visual estimate, with apical hypokinesis. There is no aortic valve stenosis.   Nonobstructive CAD.  Takotsubo cardiomyopathy pattern.  Continue medical therapy.   Labs 03/2024: Chol 142, TG 106, HDL 59, LDL 62 Hb 10.5 Cr 1.2, eGFR 42 TSH 1.7   Physical Exam Vitals and nursing note reviewed.  Constitutional:  General: She is not in acute distress. Neck:     Vascular: No JVD.  Cardiovascular:     Rate and Rhythm: Normal rate and regular rhythm.     Heart sounds: Normal heart sounds. No murmur heard. Pulmonary:     Effort: Pulmonary effort is normal.     Breath sounds: Normal breath sounds. No wheezing or rales.  Musculoskeletal:     Right lower leg: No edema.     Left lower  leg: No edema.      VISIT DIAGNOSES:   ICD-10-CM   1. Orthopnea  R06.01 EKG 12-Lead    2. Exertional dyspnea  R06.09 Pro b natriuretic peptide (BNP)    3. Essential hypertension  I10 EKG 12-Lead       [name] is a 83 y.o. female with hypertension, hyperlipidemia, dementia, exertional dyspnea, orthopnea Assessment and Plan Assessment & Plan Exertional dyspnea, orthopnea: Exertional dyspnea with more than usual activity, as well as orthopnea. Physical exam does not show any volume overload. Echocardiogram shows low normal LVEF 50-55% and grade 1 diastolic dysfunction. She currently not taking Lasix  that was previously prescribed by PCP and is not taking on starting it back again as it makes her go to the bathroom frequently. Will check proBNP today.  If elevated, will encourage her to take Lasix . I am not sure if she is a good candidate for much more aggressive medical therapy with her issues with dementia and difficulty with compliance. Discussed reducing salt intake.  Hypertension: Well-controlled  Mixed hyperlipidemia: Well-controlled      F/u in 3 months  Signed, Newman JINNY Lawrence, MD

## 2024-06-26 NOTE — Patient Instructions (Addendum)
  Lab Work: Probnp  If you have labs (blood work) drawn today and your tests are completely normal, you will receive your results only by: MyChart Message (if you have MyChart) OR A paper copy in the mail If you have any lab test that is abnormal or we need to change your treatment, we will call you to review the results.  Follow-Up: At [facility], you and your health needs are our priority.  As part of our continuing mission to provide you with exceptional heart care, our providers are all part of one team.  This team includes your primary Cardiologist (physician) and Advanced Practice Providers or APPs (Physician Assistants and Nurse Practitioners) who all work together to provide you with the care you need, when you need it.  Your next appointment:   3 month(s)  Provider:   APP  We recommend signing up for the patient portal called MyChart.  Sign up information is provided on this After Visit Summary.  MyChart is used to connect with patients for Virtual Visits (Telemedicine).  Patients are able to view lab/test results, encounter notes, upcoming appointments, etc.  Non-urgent messages can be sent to your provider as well.   To learn more about what you can do with MyChart, go to [email or web address].

## 2024-06-27 ENCOUNTER — Ambulatory Visit: Payer: Self-pay | Admitting: Cardiology

## 2024-06-27 LAB — PRO B NATRIURETIC PEPTIDE: NT-Pro BNP: 1008 pg/mL — ABNORMAL HIGH (ref 0–738)

## 2024-06-27 NOTE — Progress Notes (Signed)
 Elevated proBNP suggests fluid congestion. Recommend lasix  20 mg daily. Check BMP and proBNP in 2-3 weeks. Consider f/u appt sooner than March.  Thanks MJP

## 2024-06-27 NOTE — Progress Notes (Signed)
 Attempted to contact daughter Madelin (on [place]) and was unable to leave a message due to mailbox being full will need to try at later time.

## 2024-07-01 NOTE — Progress Notes (Signed)
 Attempted to contact daughter Madelin (on [place]) and was unable to leave a message due to mailbox being full will need to try at later time.

## 2024-07-03 MED ORDER — FUROSEMIDE 20 MG PO TABS: 20.0000 mg | ORAL_TABLET | Freq: Every day | ORAL | 2 refills | Status: AC

## 2024-07-12 ENCOUNTER — Telehealth: Payer: Self-pay

## 2024-07-12 DIAGNOSIS — R42 Dizziness and giddiness: Secondary | ICD-10-CM

## 2024-07-12 NOTE — Telephone Encounter (Signed)
 Copied from CRM #[id number]. Topic: Clinical - Medication Question >> Jul 12, 2024 10:49 AM Robinson H wrote: Reason for CRM: Patient is calling to refill medication that [facility] prescribed to her for dizziness, patient doesn't know the name of medicine, just states Matt old her to call if she ran out. Wants it sent to the [facility].  Ronal [phone]

## 2024-07-12 NOTE — Telephone Encounter (Signed)
 LM for pt to return my call

## 2024-07-15 ENCOUNTER — Other Ambulatory Visit: Payer: Self-pay | Admitting: Nurse Practitioner

## 2024-07-15 DIAGNOSIS — R42 Dizziness and giddiness: Secondary | ICD-10-CM

## 2024-07-15 MED ORDER — MECLIZINE HCL 12.5 MG PO TABS: 12.5000 mg | ORAL_TABLET | Freq: Three times a day (TID) | ORAL | 2 refills | Status: AC | PRN

## 2024-07-15 NOTE — Telephone Encounter (Signed)
Left detailed voicemail for patient to call the office back if any questions or concerns.

## 2024-07-15 NOTE — Telephone Encounter (Signed)
 This is likely meclizine . I have sent a prescription to the pharmacy she requested

## 2024-07-23 LAB — BASIC METABOLIC PANEL WITH GFR
BUN/Creatinine Ratio: 12 (ref 12–28)
BUN: 15 mg/dL (ref 8–27)
CO2: 22 mmol/L (ref 20–29)
Calcium: 9.1 mg/dL (ref 8.7–10.3)
Chloride: 99 mmol/L (ref 96–106)
Creatinine, Ser: 1.27 mg/dL — ABNORMAL HIGH (ref 0.57–1.00)
Glucose: 82 mg/dL (ref 70–99)
Potassium: 4.4 mmol/L (ref 3.5–5.2)
Sodium: 137 mmol/L (ref 134–144)
eGFR: 42 mL/min/1.73 — ABNORMAL LOW

## 2024-07-24 LAB — PRO B NATRIURETIC PEPTIDE: NT-Pro BNP: 1708 pg/mL — ABNORMAL HIGH (ref 0–738)

## 2024-07-24 NOTE — Progress Notes (Signed)
 ProBNP higher than before. Kidney function stable. Patient has f/u with me on 2/2. She has been unable to reach on phone. Okay to keep the 2/2 appt for now, unless she is reachable and complains of any worsening shortness of breath, in which case recommend sooner appt with me or APP.  Thanks MJP

## 2024-07-29 NOTE — Progress Notes (Incomplete)
 "   Mild Cognitive Impairment, likely multifactorial, concern for vascular disease   [name] is a very pleasant 84 y.o. RH female with a history of  hypertension, hyperlipidemia, COPD, benign positional vertigo with chronic dizziness, GERD  presenting today in follow-up for evaluation of memory loss. Patient is on donepezil  5 mg daily***. Patient was last seen on 01/31/2024***. Memory is controlled***. MMSE today is  /30. Patient is able to participate on ADLs, does not drive.  Mood is ***. This patient is here alone***.  Previous records as well as any outside records available were reviewed prior to todays visit   Continue donepezil , increase to 10 mg daily, side effects discussed*** B12 Recommend following with PCP and GI on iron  deficiency anemia  Recommend checking hearing in an effort to improve comprehension Continue to control mood as per PCP Recommend good control of cardiovascular risk factors [place]ow up in  months***     Discussed the use of AI scribe software for clinical note transcription with the patient, who gave verbal consent to proceed.  History of Present Illness   Initial visit 01/31/2024 How long did patient have memory difficulties? I don't know.  Patient reports some difficulty remembering new information, recent conversations, names. LTM is reported to be good. Not very active, does not do any brain stimulating exercises.  repeats oneself?  Endorsed Disoriented when walking into a room? Denies    Leaving objects in unusual places?  Denies.   Wandering behavior? Denies.   Any personality changes, or depression, anxiety? Depression after the death of her mother and her son. She is caring for one daughter with Cancer.  Hallucinations or paranoia? Denies.   Seizures? Denies.    Any sleep changes?  Sleeps well.  Denies frequent nightmares or dream reenactment, other REM behavior or sleepwalking   Sleep apnea? Denies.   Any hygiene concerns?  Denies.    Independent of bathing and dressing? Endorsed  Does the patient need help with medications? Daughter is in charge  Who is in charge of the finances? Patient is in charge    Any changes in appetite?   Denies.     Patient have trouble swallowing?  Denies.   Does the patient cook   yes, denies forgetting common recipes or kitchen accidents   Any headaches?  Denies.   Chronic pain? Denies.   Ambulates with difficulty? Denies.  Recent falls or head injuries? Denies.  Vision changes?  Denies any new issues.  Any strokelike symptoms? Denies.   Any tremors? Denies.   Any anosmia? Denies.   Any incontinence of urine?  She has OAB Any bowel dysfunction? Denies.      Patient lives alone  History of heavy alcohol intake? Denies.   History of heavy tobacco use? Quit 6 years  ago  Family history of dementia?   Does not know, she is adopted.  Does patient drive? 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   MRI of the brain 10/13/2023, personally reviewed with mild generalized cerebral volume loss, advanced chronic small vessel changes, no acute intracranial abnormalities         Past Medical History:  Diagnosis Date   Acute metabolic encephalopathy 06/02/2019   Adenocarcinoma of right lung, stage 1 (HCC) 07/28/2016   Anxiety    Cancer (HCC)    uterine   Closed fracture of left distal radius 06/13/2020   COPD (chronic obstructive pulmonary disease) (HCC)    Coronary artery disease  mild nonobstructive by 2019 cath   Depression    GERD (gastroesophageal reflux disease)    Hyperlipidemia    Hypertension    Low back pain    Osteoarthritis    Osteoporosis    Takotsubo cardiomyopathy    EF recovered to 50-55% by 01/23/18 echo   Vitamin D  insufficiency 06/13/2020     Past Surgical History:  Procedure Laterality Date   ANKLE SURGERY Right    horse accident   APPENDECTOMY     BACK SURGERY     CHOLECYSTECTOMY     EYE SURGERY     lense implant   FRACTURE SURGERY      HIP SURGERY  01/2010   Left Hip    INNER EAR SURGERY Bilateral 1970   related to severe ear infections   KNEE SURGERY     Right knee   LEFT HEART CATH AND CORONARY ANGIOGRAPHY N/A 12/11/2017   Procedure: LEFT HEART CATH AND CORONARY ANGIOGRAPHY;  Surgeon: Dann Candyce RAMAN, MD;  Location: [facility];  Service: Cardiovascular;  Laterality: N/A;   LOBECTOMY Right 05/09/2016   Procedure: RIGHT UPPER LOBECTOMY;  Surgeon: Elspeth JAYSON Millers, MD;  Location: [facility] OR;  Service: Thoracic;  Laterality: Right;   MASS EXCISION  08/25/2011   Procedure: EXCISION MASS;  Surgeon: Vicenta DELENA Poli, MD;  Location: [facility];  Service: General;  Laterality: N/A;  excision left neck mass   OPEN REDUCTION INTERNAL FIXATION (ORIF) DISTAL RADIAL FRACTURE Left 06/12/2020   Procedure: OPEN REDUCTION INTERNAL FIXATION (ORIF) DISTAL RADIAL FRACTURE;  Surgeon: Kendal Franky SQUIBB, MD;  Location: [facility] OR;  Service: Orthopedics;  Laterality: Left;   TOTAL ABDOMINAL HYSTERECTOMY     TOTAL KNEE ARTHROPLASTY Right 05/27/2019   Procedure: TOTAL KNEE ARTHROPLASTY;  Surgeon: Rubie Kemps, MD;  Location: [facility];  Service: Orthopedics;  Laterality: Right;  75 mins needed for length of case   TOTAL KNEE ARTHROPLASTY Left 12/30/2019   Procedure: TOTAL KNEE ARTHROPLASTY;  Surgeon: Rubie Kemps, MD;  Location: [facility];  Service: Orthopedics;  Laterality: Left;   VIDEO ASSISTED THORACOSCOPY (VATS)/WEDGE RESECTION Right 05/09/2016   Procedure: VIDEO ASSISTED THORACOSCOPY (VATS)/WEDGE RESECTION;  Surgeon: Elspeth JAYSON Millers, MD;  Location: [facility] OR;  Service: Thoracic;  Laterality: Right;         Objective:     PHYSICAL EXAMINATION:    VITALS:  There were no vitals filed for this visit.  GEN:  The patient appears stated age and is in NAD. HEENT:  Normocephalic, atraumatic.   Neurological examination:  General: NAD, well-groomed, appears stated age. Orientation: The patient is alert. Oriented to person,  place and not to date.*** Cranial nerves: There is good facial symmetry.The speech is fluent and clear, she is a dentulous. No aphasia or dysarthria. Fund of knowledge is appropriate. Recent and remote memory impaired.  Attention and concentration are reduced.  Able to name objects and repeat phrases.  Hearing is decreased to conversational tone ***.   Delayed recall *** Sensation: Sensation is intact to light touch throughout Motor: Strength is at least antigravity x4. DTR's 2/4 in UE/LE       No data to display             01/31/2024    2:00 PM  MMSE - Mini Mental State Exam  Orientation to time 3  Orientation to Place 4  Registration 3  Attention/ Calculation 4  Recall 2  Language- name 2 objects 2  Language- repeat 1  Language-  follow 3 step command 3  Language- read & follow direction 1  Write a sentence 0  Copy design 0  Total score 23      Movement examination: Tone: There is normal tone in the UE/LE Abnormal movements:  no tremor.  No myoclonus.  No asterixis.   Coordination:  There is no decremation with RAM's. Normal finger to nose  Gait and Station: The patient has no difficulty arising out of a deep-seated chair without the use of the hands. The patient's stride length is good.  Gait is cautious and narrow.   Thank you for allowing us  the opportunity to participate in the care of this nice patient. Please do not hesitate to contact us  for any questions or concerns.   Total time spent on today's visit was *** minutes dedicated to this patient today, preparing to see patient, examining the patient, ordering tests and/or medications and counseling the patient, documenting clinical information in the EHR or other health record, independently interpreting results and communicating results to the patient/family, discussing treatment and goals, answering patient's questions and coordinating care.  Cc:  Wendee Lynwood HERO, NP  Camie Sevin 07/29/2024 6:15 AM          "

## 2024-07-30 NOTE — Telephone Encounter (Signed)
Daughter is returning call. Please advise

## 2024-07-31 ENCOUNTER — Ambulatory Visit: Admitting: Physician Assistant

## 2024-07-31 ENCOUNTER — Encounter: Payer: Self-pay | Admitting: Physician Assistant

## 2024-08-06 ENCOUNTER — Other Ambulatory Visit: Payer: Self-pay

## 2024-08-07 MED ORDER — METOCLOPRAMIDE HCL 5 MG PO TABS: 5.0000 mg | ORAL_TABLET | Freq: Two times a day (BID) | ORAL | 1 refills | Status: AC

## 2024-08-26 ENCOUNTER — Ambulatory Visit: Admitting: Cardiology

## 2024-08-29 ENCOUNTER — Other Ambulatory Visit: Payer: Self-pay | Admitting: *Deleted

## 2024-08-29 DIAGNOSIS — I10 Essential (primary) hypertension: Secondary | ICD-10-CM

## 2024-09-06 ENCOUNTER — Ambulatory Visit: Admitting: Nurse Practitioner

## 2024-09-19 ENCOUNTER — Ambulatory Visit: Payer: Self-pay | Admitting: Physician Assistant

## 2024-09-24 ENCOUNTER — Ambulatory Visit: Admitting: Nurse Practitioner

## 2024-11-13 ENCOUNTER — Ambulatory Visit: Admitting: Cardiology

## 2024-11-19 ENCOUNTER — Ambulatory Visit

## 2024-11-19 ENCOUNTER — Encounter: Admitting: Nurse Practitioner
